# Patient Record
Sex: Female | Born: 1975 | Race: Black or African American | Hispanic: No | Marital: Married
Health system: Southern US, Academic
[De-identification: ages and names within clinical notes are randomized; demographics above are authoritative.]

## PROBLEM LIST (undated history)

## (undated) ENCOUNTER — Encounter: Attending: Gynecologic Oncology | Primary: Gynecologic Oncology

## (undated) ENCOUNTER — Ambulatory Visit: Payer: PRIVATE HEALTH INSURANCE

## (undated) ENCOUNTER — Encounter

## (undated) ENCOUNTER — Telehealth

## (undated) ENCOUNTER — Telehealth: Attending: Gynecologic Oncology | Primary: Gynecologic Oncology

## (undated) ENCOUNTER — Encounter: Attending: Diagnostic Radiology | Primary: Diagnostic Radiology

## (undated) ENCOUNTER — Ambulatory Visit

## (undated) ENCOUNTER — Encounter
Attending: Student in an Organized Health Care Education/Training Program | Primary: Student in an Organized Health Care Education/Training Program

## (undated) ENCOUNTER — Telehealth
Attending: Student in an Organized Health Care Education/Training Program | Primary: Student in an Organized Health Care Education/Training Program

## (undated) ENCOUNTER — Ambulatory Visit
Payer: PRIVATE HEALTH INSURANCE | Attending: Student in an Organized Health Care Education/Training Program | Primary: Student in an Organized Health Care Education/Training Program

## (undated) ENCOUNTER — Telehealth
Payer: PRIVATE HEALTH INSURANCE | Attending: Student in an Organized Health Care Education/Training Program | Primary: Student in an Organized Health Care Education/Training Program

## (undated) ENCOUNTER — Encounter: Attending: Family Medicine | Primary: Family Medicine

## (undated) ENCOUNTER — Telehealth: Attending: Hematology & Oncology | Primary: Hematology & Oncology

## (undated) DIAGNOSIS — J45909 Unspecified asthma, uncomplicated: Secondary | ICD-10-CM

## (undated) DIAGNOSIS — R110 Nausea: Secondary | ICD-10-CM

## (undated) DIAGNOSIS — D6481 Anemia due to antineoplastic chemotherapy: Secondary | ICD-10-CM

## (undated) DIAGNOSIS — R1900 Intra-abdominal and pelvic swelling, mass and lump, unspecified site: Secondary | ICD-10-CM

## (undated) DIAGNOSIS — Z95828 Presence of other vascular implants and grafts: Secondary | ICD-10-CM

## (undated) DIAGNOSIS — Z800 Family history of malignant neoplasm of digestive organs: Secondary | ICD-10-CM

## (undated) DIAGNOSIS — M1990 Unspecified osteoarthritis, unspecified site: Secondary | ICD-10-CM

## (undated) DIAGNOSIS — D649 Anemia, unspecified: Secondary | ICD-10-CM

## (undated) DIAGNOSIS — K259 Gastric ulcer, unspecified as acute or chronic, without hemorrhage or perforation: Secondary | ICD-10-CM

## (undated) DIAGNOSIS — Z8042 Family history of malignant neoplasm of prostate: Secondary | ICD-10-CM

## (undated) DIAGNOSIS — T451X5A Adverse effect of antineoplastic and immunosuppressive drugs, initial encounter: Secondary | ICD-10-CM

## (undated) DIAGNOSIS — E099 Drug or chemical induced diabetes mellitus without complications: Secondary | ICD-10-CM

## (undated) DIAGNOSIS — T380X5A Adverse effect of glucocorticoids and synthetic analogues, initial encounter: Secondary | ICD-10-CM

## (undated) DIAGNOSIS — Z8041 Family history of malignant neoplasm of ovary: Secondary | ICD-10-CM

## (undated) DIAGNOSIS — Z803 Family history of malignant neoplasm of breast: Secondary | ICD-10-CM

## (undated) HISTORY — DX: Family history of malignant neoplasm of ovary: Z80.41

## (undated) HISTORY — DX: Family history of malignant neoplasm of digestive organs: Z80.0

## (undated) HISTORY — DX: Family history of malignant neoplasm of prostate: Z80.42

## (undated) HISTORY — DX: Intra-abdominal and pelvic swelling, mass and lump, unspecified site: R19.00

## (undated) HISTORY — DX: Family history of malignant neoplasm of breast: Z80.3

## (undated) HISTORY — PX: UPPER GASTROINTESTINAL ENDOSCOPY: SHX188

---

## 2015-02-15 ENCOUNTER — Encounter (HOSPITAL_COMMUNITY): Payer: Self-pay | Admitting: Emergency Medicine

## 2015-02-15 ENCOUNTER — Emergency Department (HOSPITAL_COMMUNITY): Payer: Self-pay

## 2015-02-15 ENCOUNTER — Inpatient Hospital Stay (HOSPITAL_COMMUNITY)
Admission: EM | Admit: 2015-02-15 | Discharge: 2015-02-17 | DRG: 418 | Disposition: A | Discharge status: Home / Self Care | Payer: Self-pay | Attending: Surgery | Admitting: Surgery

## 2015-02-15 DIAGNOSIS — Z791 Long term (current) use of non-steroidal anti-inflammatories (NSAID): Secondary | ICD-10-CM

## 2015-02-15 DIAGNOSIS — K8000 Calculus of gallbladder with acute cholecystitis without obstruction: Principal | ICD-10-CM | POA: Diagnosis present

## 2015-02-15 DIAGNOSIS — R609 Edema, unspecified: Secondary | ICD-10-CM | POA: Diagnosis present

## 2015-02-15 DIAGNOSIS — Z6841 Body Mass Index (BMI) 40.0 and over, adult: Secondary | ICD-10-CM

## 2015-02-15 DIAGNOSIS — K819 Cholecystitis, unspecified: Secondary | ICD-10-CM | POA: Diagnosis present

## 2015-02-15 DIAGNOSIS — R1011 Right upper quadrant pain: Secondary | ICD-10-CM

## 2015-02-15 DIAGNOSIS — J45909 Unspecified asthma, uncomplicated: Secondary | ICD-10-CM | POA: Diagnosis present

## 2015-02-15 DIAGNOSIS — Z419 Encounter for procedure for purposes other than remedying health state, unspecified: Secondary | ICD-10-CM

## 2015-02-15 DIAGNOSIS — Z800 Family history of malignant neoplasm of digestive organs: Secondary | ICD-10-CM

## 2015-02-15 HISTORY — DX: Unspecified asthma, uncomplicated: J45.909

## 2015-02-15 LAB — CBC
HEMATOCRIT: 34.6 % — AB (ref 36.0–46.0)
Hemoglobin: 11.1 g/dL — ABNORMAL LOW (ref 12.0–15.0)
MCH: 27.1 pg (ref 26.0–34.0)
MCHC: 32.1 g/dL (ref 30.0–36.0)
MCV: 84.6 fL (ref 78.0–100.0)
PLATELETS: 317 10*3/uL (ref 150–400)
RBC: 4.09 MIL/uL (ref 3.87–5.11)
RDW: 12.6 % (ref 11.5–15.5)
WBC: 8.8 10*3/uL (ref 4.0–10.5)

## 2015-02-15 LAB — HCG, QUANTITATIVE, PREGNANCY: hCG, Beta Chain, Quant, S: <1 m[IU]/mL (ref <5)

## 2015-02-15 LAB — COMPREHENSIVE METABOLIC PANEL
ALT: 22 U/L (ref 14–54)
ANION GAP: 11 (ref 5–15)
AST: 17 U/L (ref 15–41)
Albumin: 3.7 g/dL (ref 3.5–5.0)
Alkaline Phosphatase: 89 U/L (ref 38–126)
BUN: 11 mg/dL (ref 6–20)
CALCIUM: 9 mg/dL (ref 8.9–10.3)
CO2: 23 mmol/L (ref 22–32)
Chloride: 106 mmol/L (ref 101–111)
Creatinine, Ser: 0.68 mg/dL (ref 0.44–1.00)
GFR calc non Af Amer: >60 mL/min (ref >60)
Glucose, Bld: 102 mg/dL — ABNORMAL HIGH (ref 65–99)
Potassium: 3.7 mmol/L (ref 3.5–5.1)
Sodium: 140 mmol/L (ref 135–145)
TOTAL PROTEIN: 7.5 g/dL (ref 6.5–8.1)
Total Bilirubin: 0.2 mg/dL — ABNORMAL LOW (ref 0.3–1.2)

## 2015-02-15 LAB — URINALYSIS, ROUTINE W REFLEX MICROSCOPIC
Bilirubin Urine: NEGATIVE
Glucose, UA: NEGATIVE mg/dL
Ketones, ur: NEGATIVE mg/dL
Nitrite: NEGATIVE
Protein, ur: NEGATIVE mg/dL
Specific Gravity, Urine: 1.028 (ref 1.005–1.030)
Urobilinogen, UA: 0.2 mg/dL (ref 0.0–1.0)
pH: 6 (ref 5.0–8.0)

## 2015-02-15 LAB — URINE MICROSCOPIC-ADD ON

## 2015-02-15 LAB — LIPASE, BLOOD: LIPASE: 17 U/L — AB (ref 22–51)

## 2015-02-15 MED ORDER — SODIUM CHLORIDE 0.9 % IV BOLUS (SEPSIS)
1000.0000 mL | Freq: Once | INTRAVENOUS | Status: AC
  Administered 2015-02-15: 1000 mL via INTRAVENOUS

## 2015-02-15 MED ORDER — ONDANSETRON HCL 4 MG/2ML IJ SOLN
4.0000 mg | Freq: Once | INTRAMUSCULAR | Status: AC
  Administered 2015-02-15: 4 mg via INTRAVENOUS
  Filled 2015-02-15: qty 2

## 2015-02-15 MED ORDER — MORPHINE SULFATE 4 MG/ML IJ SOLN
4.0000 mg | Freq: Once | INTRAMUSCULAR | Status: AC
  Administered 2015-02-15: 4 mg via INTRAVENOUS
  Filled 2015-02-15: qty 1

## 2015-02-15 NOTE — ED Provider Notes (Signed)
CSN: [id number]     Arrival date & time 02/15/15  1933 History   First MD Initiated Contact with Patient 02/15/15 2239     Chief Complaint  Patient presents with  . Back Pain   Sarah Newman is a 39 y.o. female with history of asthma who presents to the emergency department complaining of right upper quadrant abdominal pain which radiates to her right upper back ongoing since yesterday. The patient currently complaining of 7/10 throbbing pain. She reports associated nausea without vomiting. She reports a decreased appetite but ate a hot dog yesterday which made her nausea worse. She denies any burping, belching or acid reflux symptoms. Her last bowel movement was yesterday. She last ate at 6 pm. She reports taking Tylenol for treatment which she reports takes the edge off. She denies previous abdominal surgeries. The patient denies fevers, chills, vomiting, chest pain, shortness of breath, palpitations, acid reflux symptoms, urinary symptoms, diarrhea, constipation, cough, or rashes. Her last menstrual cycle started 02/12/2015.  (Consider location/radiation/quality/duration/timing/severity/associated sxs/prior Treatment) HPI  Past Medical History  Diagnosis Date  . Asthma    History reviewed. No pertinent past surgical history. No family history on file. History  Substance Use Topics  . Smoking status: Never Smoker   . Smokeless tobacco: Not on file  . Alcohol Use: Yes     Comment: Social drinker   OB History    No data available     Review of Systems  Constitutional: Negative for fever and chills.  HENT: Negative for congestion and sore throat.   Eyes: Negative for visual disturbance.  Respiratory: Negative for cough and shortness of breath.   Cardiovascular: Negative for chest pain and palpitations.  Gastrointestinal: Positive for nausea and abdominal pain. Negative for vomiting and diarrhea.  Genitourinary: Negative for dysuria, urgency, frequency, hematuria, flank pain and  difficulty urinating.  Musculoskeletal: Negative for back pain and neck pain.  Skin: Negative for rash.  Neurological: Negative for headaches.      Allergies  Review of patient's allergies indicates no known allergies.  Home Medications   Prior to Admission medications   Medication Sig Start Date End Date Taking? Authorizing Provider  Ibuprofen-Diphenhydramine Cit (IBUPROFEN PM) 200-38 MG TABS Take 2 tablets by mouth at bedtime as needed (sleep).   Yes Historical Provider, MD   BP 108/65 mmHg  Pulse 65  Temp(Src) 98.1 F (36.7 C) (Oral)  Resp 18  Ht 5\' 2"  (1.575 m)  Wt 215 lb (97.523 kg)  BMI 39.31 kg/m2  SpO2 100%  LMP 02/15/2015 Physical Exam  Constitutional: She appears well-developed and well-nourished. No distress.  Nontoxic appearing.  HENT:  Head: Normocephalic and atraumatic.  Mouth/Throat: Oropharynx is clear and moist.  Eyes: Conjunctivae are normal. Pupils are equal, round, and reactive to light. Right eye exhibits no discharge. Left eye exhibits no discharge.  Neck: Neck supple.  Cardiovascular: Normal rate, regular rhythm, normal heart sounds and intact distal pulses.  Exam reveals no gallop and no friction rub.   No murmur heard. Pulmonary/Chest: Effort normal and breath sounds normal. No respiratory distress. She has no wheezes. She has no rales.  Abdominal: Soft. Bowel sounds are normal. She exhibits no distension and no mass. There is tenderness. There is no rebound and no guarding.  Abdomen is soft. Bowel sounds are present. Patient has right upper quadrant tenderness to palpation with a positive Murphy sign. No epigastric tenderness. No right lower quadrant tenderness. Negative psoas and obturator sign.  Musculoskeletal: She exhibits no edema.  No midline neck or back tenderness. Patient has right upper mid back tenderness to palpation. No back edema, erythema, deformity or ecchymosis.  Lymphadenopathy:    She has no cervical adenopathy.  Neurological:  She is alert. Coordination normal.  Skin: Skin is warm and dry. No rash noted. She is not diaphoretic. No erythema. No pallor.  Psychiatric: She has a normal mood and affect. Her behavior is normal.  Nursing note and vitals reviewed.   ED Course  Procedures (including critical care time) Labs Review Labs Reviewed  LIPASE, BLOOD - Abnormal; Notable for the following:    Lipase 17 (*)    All other components within normal limits  COMPREHENSIVE METABOLIC PANEL - Abnormal; Notable for the following:    Glucose, Bld 102 (*)    Total Bilirubin 0.2 (*)    All other components within normal limits  CBC - Abnormal; Notable for the following:    Hemoglobin 11.1 (*)    HCT 34.6 (*)    All other components within normal limits  URINALYSIS, ROUTINE W REFLEX MICROSCOPIC (NOT AT [facility]) - Abnormal; Notable for the following:    APPearance CLOUDY (*)    Hgb urine dipstick SMALL (*)    Leukocytes, UA MODERATE (*)    All other components within normal limits  URINE MICROSCOPIC-ADD ON - Abnormal; Notable for the following:    Squamous Epithelial / LPF FEW (*)    Bacteria, UA MANY (*)    All other components within normal limits  URINE CULTURE  HCG, QUANTITATIVE, PREGNANCY    Imaging Review US Abdomen Limited Ruq  02/16/2015   CLINICAL DATA:  39 year old female with right upper quadrant pain  EXAM: US ABDOMEN LIMITED - RIGHT UPPER QUADRANT  COMPARISON:  None.  FINDINGS: Gallbladder:  There is a 1.8 cm stone at the neck of the gallbladder. There is apparent mild edema of the gallbladder wall measuring up to 7 mm in thickness. The No pericholecystic fluid. Evaluation for tenderness was limited as patient was pre-medicated. Small echogenic foci with ring down artifact in the body of the gallbladder may represent adenomyomatosis.  Common bile duct:  Diameter: 9 mm with tapering at the head of the pancreas to 6 mm.  Liver:  No focal lesion identified. Within normal limits in parenchymal echogenicity.   IMPRESSION: Cholelithiasis with apparent mild edema of the gallbladder wall. A hepatobiliary scintigraphy may provide better evaluation of the gallbladder if an acute cholecystitis is clinically suspected.   Electronically Signed   By: Anner Crete M.D.   On: 02/16/2015 00:26     EKG Interpretation None      Filed Vitals:   02/15/15 2022 02/15/15 2359  BP: 134/94 108/65  Pulse: 84 65  Temp: 98.1 F (36.7 C)   TempSrc: Oral   Resp: 18 18  Height: 5\' 2"  (1.575 m)   Weight: 215 lb (97.523 kg)   SpO2: 99% 100%     MDM   Meds given in ED:  Medications  sodium chloride 0.9 % bolus 1,000 mL (1,000 mLs Intravenous New Bag/Given 02/15/15 2322)  ondansetron (ZOFRAN) injection 4 mg (4 mg Intravenous Given 02/15/15 2319)  morphine 4 MG/ML injection 4 mg (4 mg Intravenous Given 02/15/15 2319)  HYDROmorphone (DILAUDID) injection 1 mg (1 mg Intravenous Given 02/16/15 0057)    New Prescriptions   No medications on file    Final diagnoses:  Right upper quadrant pain  Cholecystitis   This is a 39 y.o. female with history of asthma who  presents to the emergency department complaining of right upper quadrant abdominal pain which radiates to her right upper back ongoing since yesterday. The patient currently complaining of 7/10 throbbing pain. She reports associated nausea without vomiting. She reports a decreased appetite but ate a hot dog yesterday which made her nausea worse. On exam patient is afebrile and nontoxic appearing. Patient has right upper quadrant tenderness to palpation with positive Murphy sign. Urinalysis shows moderate leukocytes with many bacteria. Urine sent for culture. Lipase is 17. CMP indicates an AST of 17 and an ALT 22. Alkaline phosphatase is 89. Total bilirubin is 0.2. She is a negative hCG. Her white count is normal. Ultrasound shows a 1.8 cm stone at the neck of the gallbladder with edema of the gallbladder wall. There is positive Murphy sign. Dr. Zella Richer from  general surgery was consulted who advises admission to [place] and will see the patient in the morning. The patient is in agreement with admission.   This patient was discussed with and evaluated by Dr. Randal Buba who agrees with assessment and plan.    Waynetta Pean, PA-C 02/16/15 0206  April Palumbo, MD 02/16/15 [phone]

## 2015-02-15 NOTE — ED Notes (Signed)
Patient reports midback and upper abdominal pain which began yesterday.  Reports tenderness on palpation.  Denies vomiting/diarrhea, chest pain, shortness of breath.

## 2015-02-15 NOTE — ED Notes (Signed)
Patient complains of mid lowe back pain around her kidney area.  She has noticed blood in her stool on 7-13 and 7-14.  She also has constant epigastric pain. Patient states eating does not make it worse.  She has some nausea but no vomiting or diarrhea. No fall or injuries.

## 2015-02-16 ENCOUNTER — Inpatient Hospital Stay (HOSPITAL_COMMUNITY): Payer: Self-pay | Admitting: Anesthesiology

## 2015-02-16 ENCOUNTER — Inpatient Hospital Stay (HOSPITAL_COMMUNITY): Payer: Self-pay

## 2015-02-16 ENCOUNTER — Encounter (HOSPITAL_COMMUNITY): Admission: EM | Disposition: A | Discharge status: Home / Self Care | Payer: Self-pay | Source: Home / Self Care

## 2015-02-16 ENCOUNTER — Encounter (HOSPITAL_COMMUNITY): Payer: Self-pay | Admitting: General Practice

## 2015-02-16 DIAGNOSIS — K819 Cholecystitis, unspecified: Secondary | ICD-10-CM | POA: Diagnosis present

## 2015-02-16 HISTORY — PX: CHOLECYSTECTOMY: SHX55

## 2015-02-16 LAB — SURGICAL PCR SCREEN
MRSA, PCR: NEGATIVE
STAPHYLOCOCCUS AUREUS: NEGATIVE

## 2015-02-16 LAB — CREATININE, SERUM
CREATININE: 0.75 mg/dL (ref 0.44–1.00)
GFR calc Af Amer: >60 mL/min (ref >60)
GFR calc non Af Amer: >60 mL/min (ref >60)

## 2015-02-16 LAB — CBC
HEMATOCRIT: 32.6 % — AB (ref 36.0–46.0)
Hemoglobin: 10.5 g/dL — ABNORMAL LOW (ref 12.0–15.0)
MCH: 28.3 pg (ref 26.0–34.0)
MCHC: 32.2 g/dL (ref 30.0–36.0)
MCV: 87.9 fL (ref 78.0–100.0)
Platelets: 298 10*3/uL (ref 150–400)
RBC: 3.71 MIL/uL — ABNORMAL LOW (ref 3.87–5.11)
RDW: 12.7 % (ref 11.5–15.5)
WBC: 11.2 10*3/uL — AB (ref 4.0–10.5)

## 2015-02-16 SURGERY — LAPAROSCOPIC CHOLECYSTECTOMY WITH INTRAOPERATIVE CHOLANGIOGRAM
Anesthesia: General

## 2015-02-16 MED ORDER — HYDROMORPHONE HCL 1 MG/ML IJ SOLN
1.0000 mg | INTRAMUSCULAR | Status: DC | PRN
  Administered 2015-02-16: 1 mg via INTRAVENOUS
  Filled 2015-02-16: qty 1

## 2015-02-16 MED ORDER — CEFTRIAXONE SODIUM IN DEXTROSE 40 MG/ML IV SOLN
2.0000 g | INTRAVENOUS | Status: DC
  Administered 2015-02-16: 2 g via INTRAVENOUS
  Filled 2015-02-16 (×2): qty 50

## 2015-02-16 MED ORDER — LACTATED RINGERS IV SOLN
INTRAVENOUS | Status: DC
  Administered 2015-02-16: 1000 mL via INTRAVENOUS
  Administered 2015-02-16: 12:00:00 via INTRAVENOUS

## 2015-02-16 MED ORDER — ROCURONIUM BROMIDE 100 MG/10ML IV SOLN: INTRAVENOUS | Status: DC | PRN

## 2015-02-16 MED ORDER — ONDANSETRON HCL 4 MG/2ML IJ SOLN
INTRAMUSCULAR | Status: AC
  Filled 2015-02-16: qty 2

## 2015-02-16 MED ORDER — SODIUM CHLORIDE 0.9 % IV SOLN
INTRAVENOUS | Status: DC
  Administered 2015-02-16: 04:00:00 via INTRAVENOUS

## 2015-02-16 MED ORDER — GLYCOPYRROLATE 0.2 MG/ML IJ SOLN
INTRAMUSCULAR | Status: DC | PRN
  Administered 2015-02-16: 0.6 mg via INTRAVENOUS

## 2015-02-16 MED ORDER — BUPIVACAINE LIPOSOME 1.3 % IJ SUSP
20.0000 mL | Freq: Once | INTRAMUSCULAR | Status: DC
  Filled 2015-02-16: qty 20

## 2015-02-16 MED ORDER — CETYLPYRIDINIUM CHLORIDE 0.05 % MT LIQD: 7.0000 mL | Freq: Two times a day (BID) | OROMUCOSAL | Status: DC

## 2015-02-16 MED ORDER — LIDOCAINE HCL (CARDIAC) 20 MG/ML IV SOLN
INTRAVENOUS | Status: AC
  Filled 2015-02-16: qty 5

## 2015-02-16 MED ORDER — HEPARIN SODIUM (PORCINE) 5000 UNIT/ML IJ SOLN
5000.0000 [IU] | Freq: Three times a day (TID) | INTRAMUSCULAR | Status: DC
  Administered 2015-02-16 – 2015-02-17 (×2): 5000 [IU] via SUBCUTANEOUS
  Filled 2015-02-16 (×5): qty 1

## 2015-02-16 MED ORDER — ONDANSETRON HCL 4 MG/2ML IJ SOLN: 4.0000 mg | Freq: Three times a day (TID) | INTRAMUSCULAR | Status: AC | PRN

## 2015-02-16 MED ORDER — HYDROMORPHONE HCL 1 MG/ML IJ SOLN
INTRAMUSCULAR | Status: DC | PRN
  Administered 2015-02-16: 0.5 mg via INTRAVENOUS

## 2015-02-16 MED ORDER — SODIUM CHLORIDE 0.9 % IJ SOLN
INTRAMUSCULAR | Status: AC
  Filled 2015-02-16: qty 10

## 2015-02-16 MED ORDER — MEPERIDINE HCL 50 MG/ML IJ SOLN: 6.2500 mg | INTRAMUSCULAR | Status: DC | PRN

## 2015-02-16 MED ORDER — LIDOCAINE HCL (CARDIAC) 20 MG/ML IV SOLN
INTRAVENOUS | Status: DC | PRN
  Administered 2015-02-16: 60 mg via INTRAVENOUS

## 2015-02-16 MED ORDER — HYDROMORPHONE HCL 1 MG/ML IJ SOLN
1.0000 mg | Freq: Once | INTRAMUSCULAR | Status: AC
  Administered 2015-02-16: 1 mg via INTRAVENOUS
  Filled 2015-02-16: qty 1

## 2015-02-16 MED ORDER — EPHEDRINE SULFATE 50 MG/ML IJ SOLN
INTRAMUSCULAR | Status: AC
  Filled 2015-02-16: qty 1

## 2015-02-16 MED ORDER — ONDANSETRON HCL 4 MG PO TABS: 4.0000 mg | ORAL_TABLET | Freq: Four times a day (QID) | ORAL | Status: DC | PRN

## 2015-02-16 MED ORDER — BUPIVACAINE LIPOSOME 1.3 % IJ SUSP
INTRAMUSCULAR | Status: DC | PRN
  Administered 2015-02-16: 20 mL

## 2015-02-16 MED ORDER — ROCURONIUM BROMIDE 100 MG/10ML IV SOLN
INTRAVENOUS | Status: DC | PRN
  Administered 2015-02-16: 40 mg via INTRAVENOUS
  Administered 2015-02-16: 10 mg via INTRAVENOUS

## 2015-02-16 MED ORDER — HYDROMORPHONE HCL 1 MG/ML IJ SOLN
1.0000 mg | INTRAMUSCULAR | Status: AC | PRN
  Administered 2015-02-16 (×2): 1 mg via INTRAVENOUS
  Filled 2015-02-16 (×2): qty 1

## 2015-02-16 MED ORDER — SUCCINYLCHOLINE CHLORIDE 20 MG/ML IJ SOLN
INTRAMUSCULAR | Status: DC | PRN
  Administered 2015-02-16: 100 mg via INTRAVENOUS

## 2015-02-16 MED ORDER — KCL IN DEXTROSE-NACL 20-5-0.9 MEQ/L-%-% IV SOLN
INTRAVENOUS | Status: DC
  Administered 2015-02-16 – 2015-02-17 (×2): via INTRAVENOUS
  Filled 2015-02-16 (×4): qty 1000

## 2015-02-16 MED ORDER — ROCURONIUM BROMIDE 100 MG/10ML IV SOLN
INTRAVENOUS | Status: AC
  Filled 2015-02-16: qty 1

## 2015-02-16 MED ORDER — ONDANSETRON HCL 4 MG/2ML IJ SOLN
INTRAMUSCULAR | Status: DC | PRN
  Administered 2015-02-16: 4 mg via INTRAVENOUS

## 2015-02-16 MED ORDER — MIDAZOLAM HCL 2 MG/2ML IJ SOLN: 0.5000 mg | Freq: Once | INTRAMUSCULAR | Status: DC | PRN

## 2015-02-16 MED ORDER — IOHEXOL 300 MG/ML  SOLN
INTRAMUSCULAR | Status: DC | PRN
  Administered 2015-02-16: 50 mL via INTRAVENOUS

## 2015-02-16 MED ORDER — MIDAZOLAM HCL 5 MG/5ML IJ SOLN
INTRAMUSCULAR | Status: DC | PRN
  Administered 2015-02-16: 2 mg via INTRAVENOUS

## 2015-02-16 MED ORDER — HYDROMORPHONE HCL 2 MG/ML IJ SOLN
INTRAMUSCULAR | Status: AC
  Filled 2015-02-16: qty 1

## 2015-02-16 MED ORDER — ONDANSETRON HCL 4 MG/2ML IJ SOLN: 4.0000 mg | Freq: Four times a day (QID) | INTRAMUSCULAR | Status: DC | PRN

## 2015-02-16 MED ORDER — PROPOFOL 10 MG/ML IV BOLUS
INTRAVENOUS | Status: AC
  Filled 2015-02-16: qty 20

## 2015-02-16 MED ORDER — PROPOFOL 10 MG/ML IV BOLUS
INTRAVENOUS | Status: DC | PRN
  Administered 2015-02-16: 150 mg via INTRAVENOUS

## 2015-02-16 MED ORDER — MORPHINE SULFATE 2 MG/ML IJ SOLN
1.0000 mg | INTRAMUSCULAR | Status: DC | PRN
  Administered 2015-02-16 (×2): 1 mg via INTRAVENOUS
  Filled 2015-02-16 (×2): qty 1

## 2015-02-16 MED ORDER — DEXAMETHASONE SODIUM PHOSPHATE 10 MG/ML IJ SOLN
INTRAMUSCULAR | Status: AC
  Filled 2015-02-16: qty 1

## 2015-02-16 MED ORDER — LACTATED RINGERS IR SOLN
Status: DC | PRN
  Administered 2015-02-16: 3000 mL

## 2015-02-16 MED ORDER — PROMETHAZINE HCL 25 MG/ML IJ SOLN: 6.2500 mg | INTRAMUSCULAR | Status: DC | PRN

## 2015-02-16 MED ORDER — HYDROMORPHONE HCL 1 MG/ML IJ SOLN: 0.2500 mg | INTRAMUSCULAR | Status: DC | PRN

## 2015-02-16 MED ORDER — MIDAZOLAM HCL 2 MG/2ML IJ SOLN
INTRAMUSCULAR | Status: AC
  Filled 2015-02-16: qty 2

## 2015-02-16 MED ORDER — DEXAMETHASONE SODIUM PHOSPHATE 10 MG/ML IJ SOLN
INTRAMUSCULAR | Status: DC | PRN
  Administered 2015-02-16: 10 mg via INTRAVENOUS

## 2015-02-16 MED ORDER — NEOSTIGMINE METHYLSULFATE 10 MG/10ML IV SOLN
INTRAVENOUS | Status: DC | PRN
  Administered 2015-02-16: 4 mg via INTRAVENOUS

## 2015-02-16 MED ORDER — FENTANYL CITRATE (PF) 100 MCG/2ML IJ SOLN
INTRAMUSCULAR | Status: DC | PRN
  Administered 2015-02-16: 150 ug via INTRAVENOUS
  Administered 2015-02-16: 100 ug via INTRAVENOUS

## 2015-02-16 MED ORDER — FENTANYL CITRATE (PF) 250 MCG/5ML IJ SOLN
INTRAMUSCULAR | Status: AC
  Filled 2015-02-16: qty 5

## 2015-02-16 MED ORDER — HYDROCODONE-ACETAMINOPHEN 5-325 MG PO TABS
1.0000 | ORAL_TABLET | ORAL | Status: DC | PRN
  Administered 2015-02-16: 1 via ORAL
  Filled 2015-02-16: qty 1

## 2015-02-16 MED ORDER — ALBUTEROL SULFATE (2.5 MG/3ML) 0.083% IN NEBU
3.0000 mL | INHALATION_SOLUTION | Freq: Four times a day (QID) | RESPIRATORY_TRACT | Status: DC | PRN
  Administered 2015-02-16 – 2015-02-17 (×2): 3 mL via RESPIRATORY_TRACT
  Filled 2015-02-16 (×2): qty 3

## 2015-02-16 MED ORDER — KCL IN DEXTROSE-NACL 20-5-0.45 MEQ/L-%-% IV SOLN: INTRAVENOUS | Status: DC

## 2015-02-16 SURGICAL SUPPLY — 32 items
APPLIER CLIP ROT 10 11.4 M/L (STAPLE) ×2
BENZOIN TINCTURE PRP APPL 2/3 (GAUZE/BANDAGES/DRESSINGS) IMPLANT
CABLE HIGH FREQUENCY MONO STRZ (ELECTRODE) ×2 IMPLANT
CATH REDDICK CHOLANGI 4FR 50CM (CATHETERS) ×2 IMPLANT
CLIP APPLIE ROT 10 11.4 M/L (STAPLE) ×1 IMPLANT
COVER MAYO STAND STRL (DRAPES) ×2 IMPLANT
COVER SURGICAL LIGHT HANDLE (MISCELLANEOUS) ×2 IMPLANT
DECANTER SPIKE VIAL GLASS SM (MISCELLANEOUS) ×2 IMPLANT
DRAPE C-ARM 42X120 X-RAY (DRAPES) ×2 IMPLANT
DRAPE LAPAROSCOPIC ABDOMINAL (DRAPES) ×2 IMPLANT
ELECT REM PT RETURN 9FT ADLT (ELECTROSURGICAL) ×2
ELECTRODE REM PT RTRN 9FT ADLT (ELECTROSURGICAL) ×1 IMPLANT
GLOVE BIOGEL M 8.0 STRL (GLOVE) ×2 IMPLANT
GOWN STRL REUS W/TWL XL LVL3 (GOWN DISPOSABLE) ×8 IMPLANT
HEMOSTAT SURGICEL 4X8 (HEMOSTASIS) IMPLANT
IV CATH AUTO 14GX1.75 SAFE ORG (IV SOLUTION) ×2 IMPLANT
KIT BASIN OR (CUSTOM PROCEDURE TRAY) ×2 IMPLANT
LIQUID BAND (GAUZE/BANDAGES/DRESSINGS) ×2 IMPLANT
POUCH RETRIEVAL ECOSAC 10 (ENDOMECHANICALS) ×1 IMPLANT
POUCH RETRIEVAL ECOSAC 10MM (ENDOMECHANICALS) ×1
SCISSORS LAP 5X45 EPIX DISP (ENDOMECHANICALS) ×2 IMPLANT
SCRUB PCMX 4 OZ (MISCELLANEOUS) ×2 IMPLANT
SET IRRIG TUBING LAPAROSCOPIC (IRRIGATION / IRRIGATOR) ×2 IMPLANT
SLEEVE XCEL OPT CAN 5 100 (ENDOMECHANICALS) ×4 IMPLANT
STRIP CLOSURE SKIN 1/2X4 (GAUZE/BANDAGES/DRESSINGS) IMPLANT
SUT VIC AB 4-0 SH 18 (SUTURE) ×2 IMPLANT
SYR 20CC LL (SYRINGE) ×2 IMPLANT
TOWEL OR 17X26 10 PK STRL BLUE (TOWEL DISPOSABLE) ×2 IMPLANT
TRAY LAPAROSCOPIC (CUSTOM PROCEDURE TRAY) ×2 IMPLANT
TROCAR BLADELESS OPT 5 100 (ENDOMECHANICALS) ×2 IMPLANT
TROCAR XCEL BLUNT TIP 100MML (ENDOMECHANICALS) IMPLANT
TROCAR XCEL NON-BLD 11X100MML (ENDOMECHANICALS) ×2 IMPLANT

## 2015-02-16 NOTE — Anesthesia Postprocedure Evaluation (Signed)
  Anesthesia Post-op Note  Patient: Sarah Newman  Procedure(s) Performed: Procedure(s): LAPAROSCOPIC CHOLECYSTECTOMY WITH INTRAOPERATIVE CHOLANGIOGRAM (N/A)  Patient Location: PACU  Anesthesia Type:General  Level of Consciousness: awake, alert , oriented and patient cooperative  Airway and Oxygen Therapy: Patient Spontanous Breathing  Post-op Pain: mild  Post-op Assessment: Post-op Vital signs reviewed, Patient's Cardiovascular Status Stable, Respiratory Function Stable, Patent Airway, No signs of Nausea or vomiting and Pain level controlled              Post-op Vital Signs: Reviewed and stable  Last Vitals:  Filed Vitals:   02/16/15 1324  BP:   Pulse: 85  Temp: 36.3 C  Resp: 17    Complications: No apparent anesthesia complications

## 2015-02-16 NOTE — Transfer of Care (Signed)
Immediate Anesthesia Transfer of Care Note  Patient: Sarah Newman  Procedure(s) Performed: Procedure(s): LAPAROSCOPIC CHOLECYSTECTOMY WITH INTRAOPERATIVE CHOLANGIOGRAM (N/A)  Patient Location: PACU  Anesthesia Type:General  Level of Consciousness: awake, alert  and oriented  Airway & Oxygen Therapy: Patient Spontanous Breathing and Patient connected to face mask oxygen  Post-op Assessment: Report given to RN and Post -op Vital signs reviewed and stable  Post vital signs: Reviewed and stable  Last Vitals:  Filed Vitals:   02/16/15 0607  BP: 122/78  Pulse: 82  Temp: 36.9 C  Resp: 18    Complications: No apparent anesthesia complications

## 2015-02-16 NOTE — H&P (Signed)
Sarah Newman is an 38 y.o. female.   Chief Complaint:   Right upper quadrant pain radiating to back. HPI:  This is a 38-year-old female who for the past few months has had intermittent episodes of right upper quadrant pain that radiated through to her back. These have been self-limited. She does not relate specific food intake to these. Once a night, she had a severe episode that persisted. She presented to the emergency department where she is noted to have right upper quadrant tenderness and guarding. She underwent an ultrasound which demonstrated a large gallstone impacted in the neck of the gallbladder with gallbladder wall edema. She was given pain medication but when I wore off, the pain came back. She's had some nausea but no vomiting. No fever or chills.  Past Medical History  Diagnosis Date  . Asthma     History reviewed. No pertinent past surgical history.  History reviewed. No pertinent family history. Social History:  reports that she has never smoked. She does not have any smokeless tobacco history on file. She reports that she drinks alcohol. She reports that she does not use illicit drugs.  Allergies: No Known Allergies  Medications Prior to Admission  Medication Sig Dispense Refill  . Ibuprofen-Diphenhydramine Cit (IBUPROFEN PM) 200-38 MG TABS Take 2 tablets by mouth at bedtime as needed (sleep).      Results for orders placed or performed during the hospital encounter of 02/15/15 (from the past 48 hour(s))  Lipase, blood     Status: Abnormal   Collection Time: 02/15/15  9:35 PM  Result Value Ref Range   Lipase 17 (L) 22 - 51 U/L  Comprehensive metabolic panel     Status: Abnormal   Collection Time: 02/15/15  9:35 PM  Result Value Ref Range   Sodium 140 135 - 145 mmol/L   Potassium 3.7 3.5 - 5.1 mmol/L   Chloride 106 101 - 111 mmol/L   CO2 23 22 - 32 mmol/L   Glucose, Bld 102 (H) 65 - 99 mg/dL   BUN 11 6 - 20 mg/dL   Creatinine, Ser 0.68 0.44 - 1.00 mg/dL    Calcium 9.0 8.9 - 10.3 mg/dL   Total Protein 7.5 6.5 - 8.1 g/dL   Albumin 3.7 3.5 - 5.0 g/dL   AST 17 15 - 41 U/L   ALT 22 14 - 54 U/L   Alkaline Phosphatase 89 38 - 126 U/L   Total Bilirubin 0.2 (L) 0.3 - 1.2 mg/dL   GFR calc non Af Amer >60 >60 mL/min   GFR calc Af Amer >60 >60 mL/min    Comment: (NOTE) The eGFR has been calculated using the CKD EPI equation. This calculation has not been validated in all clinical situations. eGFR's persistently <60 mL/min signify possible Chronic Kidney Disease.    Anion gap 11 5 - 15  CBC     Status: Abnormal   Collection Time: 02/15/15  9:35 PM  Result Value Ref Range   WBC 8.8 4.0 - 10.5 K/uL   RBC 4.09 3.87 - 5.11 MIL/uL   Hemoglobin 11.1 (L) 12.0 - 15.0 g/dL   HCT 34.6 (L) 36.0 - 46.0 %   MCV 84.6 78.0 - 100.0 fL   MCH 27.1 26.0 - 34.0 pg   MCHC 32.1 30.0 - 36.0 g/dL   RDW 12.6 11.5 - 15.5 %   Platelets 317 150 - 400 K/uL  hCG, quantitative, pregnancy     Status: None   Collection Time: 02/15/15  9:35   PM  Result Value Ref Range   hCG, Beta Chain, Quant, S <1 <5 mIU/mL    Comment:          GEST. AGE      CONC.  (mIU/mL)   <=1 WEEK        5 - 50     2 WEEKS       50 - 500     3 WEEKS       100 - 10,000     4 WEEKS     1,000 - 30,000     5 WEEKS     3,500 - 115,000   6-8 WEEKS     12,000 - 270,000    12 WEEKS     15,000 - 220,000        FEMALE AND NON-PREGNANT FEMALE:     LESS THAN 5 mIU/mL   Urinalysis, Routine w reflex microscopic (not at [facility])     Status: Abnormal   Collection Time: 02/15/15  9:40 PM  Result Value Ref Range   Color, Urine YELLOW YELLOW   APPearance CLOUDY (A) CLEAR   Specific Gravity, Urine 1.028 1.005 - 1.030   pH 6.0 5.0 - 8.0   Glucose, UA NEGATIVE NEGATIVE mg/dL   Hgb urine dipstick SMALL (A) NEGATIVE   Bilirubin Urine NEGATIVE NEGATIVE   Ketones, ur NEGATIVE NEGATIVE mg/dL   Protein, ur NEGATIVE NEGATIVE mg/dL   Urobilinogen, UA 0.2 0.0 - 1.0 mg/dL   Nitrite NEGATIVE NEGATIVE   Leukocytes, UA  MODERATE (A) NEGATIVE  Urine microscopic-add on     Status: Abnormal   Collection Time: 02/15/15  9:40 PM  Result Value Ref Range   Squamous Epithelial / LPF FEW (A) RARE   WBC, UA 21-50 <3 WBC/hpf   RBC / HPF 0-2 <3 RBC/hpf   Bacteria, UA MANY (A) RARE  Surgical pcr screen     Status: None   Collection Time: 02/16/15  3:33 AM  Result Value Ref Range   MRSA, PCR NEGATIVE NEGATIVE   Staphylococcus aureus NEGATIVE NEGATIVE    Comment:        The Xpert SA Assay (FDA approved for NASAL specimens in patients over 16 years of age), is one component of a comprehensive surveillance program.  Test performance has been validated by [facility] for patients greater than or equal to 39 year old. It is not intended to diagnose infection nor to guide or monitor treatment.    US Abdomen Limited Ruq  02/16/2015   CLINICAL DATA:  39 year old female with right upper quadrant pain  EXAM: US ABDOMEN LIMITED - RIGHT UPPER QUADRANT  COMPARISON:  None.  FINDINGS: Gallbladder:  There is a 1.8 cm stone at the neck of the gallbladder. There is apparent mild edema of the gallbladder wall measuring up to 7 mm in thickness. The No pericholecystic fluid. Evaluation for tenderness was limited as patient was pre-medicated. Small echogenic foci with ring down artifact in the body of the gallbladder may represent adenomyomatosis.  Common bile duct:  Diameter: 9 mm with tapering at the head of the pancreas to 6 mm.  Liver:  No focal lesion identified. Within normal limits in parenchymal echogenicity.  IMPRESSION: Cholelithiasis with apparent mild edema of the gallbladder wall. A hepatobiliary scintigraphy may provide better evaluation of the gallbladder if an acute cholecystitis is clinically suspected.   Electronically Signed   By: Anner Crete M.D.   On: 02/16/2015 00:26    Review of Systems  Constitutional:  Negative for fever and chills.  HENT: Negative for congestion and sore throat.   Respiratory: Negative  for shortness of breath.   Cardiovascular: Negative for chest pain.  Gastrointestinal: Positive for nausea, abdominal pain and blood in stool (Occasional bright red blood in stool). Negative for vomiting.  Genitourinary: Negative for dysuria and hematuria.  Endo/Heme/Allergies: Does not bruise/bleed easily.    Blood pressure 122/78, pulse 82, temperature 98.5 F (36.9 C), temperature source Oral, resp. rate 18, height 5' 2" (1.575 m), weight 101.1 kg (222 lb 14.2 oz), last menstrual period 02/15/2015, SpO2 98 %. Physical Exam  Constitutional: No distress.  Obese female  HENT:  Head: Normocephalic and atraumatic.  Eyes: EOM are normal. No scleral icterus.  Cardiovascular: Normal rate and regular rhythm.   Respiratory: Effort normal and breath sounds normal.  GI: Soft. There is tenderness (Right upper quadrant). There is guarding.  Obese.  Musculoskeletal: She exhibits no edema.  Neurological: She is alert.  Skin: Skin is warm and dry. Erythema: Right upper quadrant.  Psychiatric: She has a normal mood and affect. Her behavior is normal.     Assessment/Plan 1. Persistent biliary colic due to cholelithiasis with concern for early acute cholecystitis-Liver function tests are normal, common bile duct diameter upper limits of normal, white blood cell count normal.  2. Occasional bright red blood per rectum. Father had colon cancer at the age of 26. She also had an aunt with colon cancer.  Plan: Start IV antibiotics. Laparoscopic cholecystectomy possible open with cholangiogram.  I have explained the procedure, risks, and aftercare of cholecystectomy.  Risks include but are not limited to bleeding, infection, wound problems, anesthesia, diarrhea, bile leak, injury to common bile duct/liver/intestine.  He/she seems to understand and agrees with the plan.  She will need further investigation of intermittent bright red blood per rectum as an outpatient. We discussed this.   Dimetri Armitage  J 02/16/2015, 7:19 AM

## 2015-02-16 NOTE — Anesthesia Preprocedure Evaluation (Addendum)
Anesthesia Evaluation  Patient identified by MRN, date of birth, ID band Patient awake    Reviewed: Allergy & Precautions, NPO status , Patient's Chart, lab work & pertinent test results  History of Anesthesia Complications Negative for: history of anesthetic complications  Airway Mallampati: II  TM Distance: >3 FB Neck ROM: Full    Dental  (+) Chipped, Dental Advisory Given   Pulmonary asthma (last inhaler use 3 weeks ago) ,  breath sounds clear to auscultation        Cardiovascular negative cardio ROS  Rhythm:Regular Rate:Normal     Neuro/Psych negative neurological ROS     GI/Hepatic Neg liver ROS, GERD-  Poorly Controlled,Nausea with acute chole   Endo/Other  Morbid obesity  Renal/GU negative Renal ROS     Musculoskeletal   Abdominal (+) + obese,   Peds  Hematology negative hematology ROS (+)   Anesthesia Other Findings   Reproductive/Obstetrics LMP presently                             Anesthesia Physical Anesthesia Plan  ASA: III  Anesthesia Plan: General   Post-op Pain Management:    Induction: Intravenous, Rapid sequence and Cricoid pressure planned  Airway Management Planned: Oral ETT  Additional Equipment:   Intra-op Plan:   Post-operative Plan: Extubation in OR  Informed Consent: I have reviewed the patients History and Physical, chart, labs and discussed the procedure including the risks, benefits and alternatives for the proposed anesthesia with the patient or authorized representative who has indicated his/her understanding and acceptance.   Dental advisory given  Plan Discussed with: CRNA and Surgeon  Anesthesia Plan Comments: (Plan routine monitors, GETA)        Anesthesia Quick Evaluation

## 2015-02-16 NOTE — Interval H&P Note (Signed)
History and Physical Interval Note:  02/16/2015 10:02 AM  Sarah Newman  has presented today for surgery, with the diagnosis of cholecystitis  The various methods of treatment have been discussed with the patient and family. After consideration of risks, benefits and other options for treatment, the patient has consented to  Procedure(s): LAPAROSCOPIC CHOLECYSTECTOMY WITH INTRAOPERATIVE CHOLANGIOGRAM (N/A) as a surgical intervention .  The patient's history has been reviewed, patient examined, no change in status, stable for surgery.  I have reviewed the patient's chart and labs.  Questions were answered to the patient's satisfaction.     Rodnisha Blomgren B

## 2015-02-16 NOTE — Anesthesia Procedure Notes (Signed)
Procedure Name: Intubation Date/Time: 02/16/2015 10:33 AM Performed by: Glory Buff Pre-anesthesia Checklist: Patient identified, Emergency Drugs available, Suction available and Patient being monitored Patient Re-evaluated:Patient Re-evaluated prior to inductionOxygen Delivery Method: Circle System Utilized Preoxygenation: Pre-oxygenation with 100% oxygen Intubation Type: IV induction Ventilation: Mask ventilation without difficulty Laryngoscope Size: Miller and 3 Tube type: Oral Tube size: 7.5 mm Number of attempts: 1 Airway Equipment and Method: Stylet and Oral airway Placement Confirmation: ETT inserted through vocal cords under direct vision,  positive ETCO2 and breath sounds checked- equal and bilateral Secured at: 20 cm Tube secured with: Tape Dental Injury: Teeth and Oropharynx as per pre-operative assessment

## 2015-02-16 NOTE — ED Notes (Signed)
MD at bedside. 

## 2015-02-16 NOTE — Op Note (Signed)
Almarie Kurdziel   02/16/2015  12:13 PM  Procedure: Laparoscopic Cholecystectomy with intraoperative cholangiogram  Surgeon: Catalina Antigua B. Hassell Done, MD, FACS Asst:  none  Anes:  General  Drains:  None  Findings: Acute cholecystitis with normal IOC (air bubbles noted)  Description of Procedure: The patient was taken to OR 2 and given general anesthesia.  The patient was prepped with PCMX and draped sterilely. A time out was performed.  Access to the abdomen was achieved with a 5 mm Optiview through the right upper quadrant.  Port placement included three 5 mm and one 11 mm trocar.    The gallbladder was visualized and the fundus was grasped and the gallbladder was elevated. Traction on the infundibulum allowed for successful demonstration of the critical view. Inflammatory changes were acute with edema and adhesions to the duodenum which were lysed.  The cystic duct was identified and clipped up on the gallbladder and an incision was made in the cystic duct and the Reddick catheter was inserted after milking the cystic duct of any debris. A dynamic cholangiogram was performed which demonstrated some air bubbles but no stones and intrahepatic radicles and flow into the duodenum.    The cystic duct was then triple clipped and divided, the cystic artery was double clipped and divided and then the gallbladder was removed from the gallbladder bed. Removal of the gallbladder from the gallbladder bed was performed with the hook cautery .  The gallbladder was then placed in a bag and brought out through the 11 mm  trocar sites. The gallbladder bed was inspected and no bleeding or bile leaks were seen.    Incisions were injected with Exparel and closed with 4-0 Vicryl and Liquiban on the skin.  Sponge and needle count were correct.    The patient was taken to the recovery room in satisfactory condition.

## 2015-02-17 LAB — COMPREHENSIVE METABOLIC PANEL
ALT: 32 U/L (ref 14–54)
AST: 32 U/L (ref 15–41)
Albumin: 3.4 g/dL — ABNORMAL LOW (ref 3.5–5.0)
Alkaline Phosphatase: 78 U/L (ref 38–126)
Anion gap: 7 (ref 5–15)
BUN: 5 mg/dL — ABNORMAL LOW (ref 6–20)
CO2: 26 mmol/L (ref 22–32)
Calcium: 8.5 mg/dL — ABNORMAL LOW (ref 8.9–10.3)
Chloride: 106 mmol/L (ref 101–111)
Creatinine, Ser: 0.6 mg/dL (ref 0.44–1.00)
GFR calc Af Amer: >60 mL/min (ref >60)
GLUCOSE: 159 mg/dL — AB (ref 65–99)
Potassium: 4 mmol/L (ref 3.5–5.1)
SODIUM: 139 mmol/L (ref 135–145)
Total Bilirubin: 0.1 mg/dL — ABNORMAL LOW (ref 0.3–1.2)
Total Protein: 6.7 g/dL (ref 6.5–8.1)

## 2015-02-17 LAB — CBC
HCT: 30.6 % — ABNORMAL LOW (ref 36.0–46.0)
Hemoglobin: 9.9 g/dL — ABNORMAL LOW (ref 12.0–15.0)
MCH: 28 pg (ref 26.0–34.0)
MCHC: 32.4 g/dL (ref 30.0–36.0)
MCV: 86.7 fL (ref 78.0–100.0)
Platelets: 315 10*3/uL (ref 150–400)
RBC: 3.53 MIL/uL — ABNORMAL LOW (ref 3.87–5.11)
RDW: 12.5 % (ref 11.5–15.5)
WBC: 9.6 10*3/uL (ref 4.0–10.5)

## 2015-02-17 MED ORDER — OXYCODONE-ACETAMINOPHEN 5-325 MG PO TABS: 1.0000 | ORAL_TABLET | ORAL | Status: DC | PRN

## 2015-02-17 NOTE — Discharge Summary (Signed)
Reviewed d/c instructions with pt and family including follow-up appointment, medications, incision care, and precautions.  Emphasized importance of consuming low fat diet as related to gall bladder removal.  Pt/family verbalized good understanding of all topics discussed. Pt being d/c to home into care of family.

## 2015-02-17 NOTE — Progress Notes (Signed)
1 Day Post-Op  Subjective: Feels better. No complaints  Objective: Vital signs in last 24 hours: Temp:  [97.4 F (36.3 C)-98.9 F (37.2 C)] 98 F (36.7 C) (07/16 0530) Pulse Rate:  [69-85] 85 (07/16 0530) Resp:  [10-18] 18 (07/16 0530) BP: (107-126)/(66-81) 109/70 mmHg (07/16 0530) SpO2:  [95 %-100 %] 98 % (07/16 0530) Last BM Date: 02/14/15  Intake/Output from previous day: 07/15 0701 - 07/16 0700 In: 1400 [I.V.:1400] Out: 1075 [Urine:1050; Blood:25] Intake/Output this shift:    Resp: clear to auscultation bilaterally Cardio: regular rate and rhythm GI: soft, mild tenderness. incisions look good. good bs  Lab Results:   Recent Labs  02/16/15 1510 02/17/15 0515  WBC 11.2* 9.6  HGB 10.5* 9.9*  HCT 32.6* 30.6*  PLT 298 315   BMET  Recent Labs  02/15/15 2135 02/16/15 1510 02/17/15 0515  NA 140  --  139  K 3.7  --  4.0  CL 106  --  106  CO2 23  --  26  GLUCOSE 102*  --  159*  BUN 11  --  5*  CREATININE 0.68 0.75 0.60  CALCIUM 9.0  --  8.5*   PT/INR No results for input(s): LABPROT, INR in the last 72 hours. ABG No results for input(s): PHART, HCO3 in the last 72 hours.  Invalid input(s): PCO2, PO2  Studies/Results: Dg Cholangiogram Operative  02/16/2015   CLINICAL DATA:  Cholelithiasis, laparoscopic cholecystectomy  EXAM: INTRAOPERATIVE CHOLANGIOGRAM  TECHNIQUE: Cholangiographic images from the C-arm fluoroscopic device were submitted for interpretation post-operatively. Please see the procedural report for the amount of contrast and the fluoroscopy time utilized.  COMPARISON:  02/16/2015  FINDINGS: Intraoperative cholangiogram performed during the laparoscopic cholecystectomy. Single spot fluoroscopic image obtained during the injection. The cystic duct, biliary confluence, common hepatic duct, and common bile duct appear patent. Contrast does drain into the duodenum. No obstruction. Round filling defect in the proximal common bile duct could represent  injected air bubbles versus choledocholithiasis.  IMPRESSION: Negative for biliary obstruction  Common bile duct filling defects could represent air bubbles versus choledocholithiasis. Limited single view only exam.   Electronically Signed   By: Jerilynn Mages.  Shick M.D.   On: 02/16/2015 12:10   US Abdomen Limited Ruq  02/16/2015   CLINICAL DATA:  39 year old female with right upper quadrant pain  EXAM: US ABDOMEN LIMITED - RIGHT UPPER QUADRANT  COMPARISON:  None.  FINDINGS: Gallbladder:  There is a 1.8 cm stone at the neck of the gallbladder. There is apparent mild edema of the gallbladder wall measuring up to 7 mm in thickness. The No pericholecystic fluid. Evaluation for tenderness was limited as patient was pre-medicated. Small echogenic foci with ring down artifact in the body of the gallbladder may represent adenomyomatosis.  Common bile duct:  Diameter: 9 mm with tapering at the head of the pancreas to 6 mm.  Liver:  No focal lesion identified. Within normal limits in parenchymal echogenicity.  IMPRESSION: Cholelithiasis with apparent mild edema of the gallbladder wall. A hepatobiliary scintigraphy may provide better evaluation of the gallbladder if an acute cholecystitis is clinically suspected.   Electronically Signed   By: Anner Crete M.D.   On: 02/16/2015 00:26    Anti-infectives: Anti-infectives    Start     Dose/Rate Route Frequency Ordered Stop   02/16/15 0800  cefTRIAXone (ROCEPHIN) 2 g in dextrose 5 % 50 mL IVPB - Premix  Status:  Discontinued     2 g 100 mL/hr over 30 Minutes  Intravenous Every 24 hours 02/16/15 0727 02/17/15 0730      Assessment/Plan: s/p Procedure(s): LAPAROSCOPIC CHOLECYSTECTOMY WITH INTRAOPERATIVE CHOLANGIOGRAM (N/A) Advance diet Discharge  LOS: 1 day    TOTH III,Dale Strausser S 02/17/2015

## 2015-02-17 NOTE — Progress Notes (Signed)
UR Completed. Keaundre Thelin, RN, BSN.  [phone] 

## 2015-02-17 NOTE — Plan of Care (Signed)
Problem: Phase II Progression Outcomes Goal: Return of bowel function (flatus, BM) IF ABDOMINAL SURGERY:  Outcome: Completed/Met Date Met:  02/17/15 Adequate for d/c.     

## 2015-02-18 LAB — URINE CULTURE: Culture: >=100000

## 2015-02-19 ENCOUNTER — Encounter (HOSPITAL_COMMUNITY): Payer: Self-pay | Admitting: Surgery

## 2015-02-28 NOTE — Discharge Summary (Signed)
Physician Discharge Summary  Patient ID: Sarah Newman MRN: [id number] DOB/AGE: April 29, 1976 39 y.o.  Admit date: 02/15/2015 Discharge date: 02/17/2015  Admission Diagnoses:  Acute cholecystitis  Discharge Diagnoses:  Active Problems:   Cholecystitis   PROCEDURES: Laparoscopic Cholecystectomy with intraoperative cholangiogram, 02/16/15, Dr. Johnathan Hausen.  Hospital Course: This is a 39 year old female who for the past few months has had intermittent episodes of right upper quadrant pain that radiated through to her back. These have been self-limited. She does not relate specific food intake to these. Once a night, she had a severe episode that persisted. She presented to the emergency department where she is noted to have right upper quadrant tenderness and guarding. She underwent an ultrasound which demonstrated a large gallstone impacted in the neck of the gallbladder with gallbladder wall edema. She was given pain medication but when I wore off, the pain came back. She's had some nausea but no vomiting. No fever or chills She was admitted by Dr. Zella Richer on 02/16/15, and underwent surgery by DR Hassell Done later that day.  She did well and was discharge by Dr. Marlou Starks the following AM.  I did not see the patient.  Follow up as below.    Disposition: 01-Home or Self Care  Discharge Instructions    Call MD for:  difficulty breathing, headache or visual disturbances    Complete by:  As directed      Call MD for:  extreme fatigue    Complete by:  As directed      Call MD for:  hives    Complete by:  As directed      Call MD for:  persistant dizziness or light-headedness    Complete by:  As directed      Call MD for:  persistant nausea and vomiting    Complete by:  As directed      Call MD for:  redness, tenderness, or signs of infection (pain, swelling, redness, odor or green/yellow discharge around incision site)    Complete by:  As directed      Call MD for:  severe uncontrolled pain     Complete by:  As directed      Call MD for:  temperature >100.4    Complete by:  As directed      Diet - low sodium heart healthy    Complete by:  As directed      Discharge instructions    Complete by:  As directed   May shower. No heavy lifting. Low fat diet     Increase activity slowly    Complete by:  As directed      No wound care    Complete by:  As directed             Medication List    TAKE these medications        IBUPROFEN PM 200-38 MG Tabs  Generic drug:  Ibuprofen-Diphenhydramine Cit  Take 2 tablets by mouth at bedtime as needed (sleep).     oxyCODONE-acetaminophen 5-325 MG per tablet  Commonly known as:  ROXICET  Take 1-2 tablets by mouth every 4 (four) hours as needed for severe pain.           Follow-up Information    Follow up with Johnathan Hausen B, MD In 2 weeks.   Specialty:  General Surgery   Contact information:   [address] [place] [id number] [phone]       Signed: Earnstine Regal 02/28/2015, 2:56 PM

## 2016-07-04 DIAGNOSIS — Z8619 Personal history of other infectious and parasitic diseases: Secondary | ICD-10-CM

## 2016-07-04 HISTORY — DX: Personal history of other infectious and parasitic diseases: Z86.19

## 2016-07-14 ENCOUNTER — Emergency Department (HOSPITAL_COMMUNITY): Payer: BLUE CROSS/BLUE SHIELD

## 2016-07-14 ENCOUNTER — Emergency Department (HOSPITAL_COMMUNITY)
Admission: EM | Admit: 2016-07-14 | Discharge: 2016-07-14 | Disposition: A | Discharge status: Home / Self Care | Payer: BLUE CROSS/BLUE SHIELD | Attending: Emergency Medicine | Admitting: Emergency Medicine

## 2016-07-14 ENCOUNTER — Encounter (HOSPITAL_COMMUNITY): Payer: Self-pay | Admitting: Emergency Medicine

## 2016-07-14 DIAGNOSIS — R1012 Left upper quadrant pain: Secondary | ICD-10-CM | POA: Diagnosis present

## 2016-07-14 DIAGNOSIS — N3000 Acute cystitis without hematuria: Secondary | ICD-10-CM | POA: Insufficient documentation

## 2016-07-14 DIAGNOSIS — J45909 Unspecified asthma, uncomplicated: Secondary | ICD-10-CM | POA: Insufficient documentation

## 2016-07-14 DIAGNOSIS — Z79899 Other long term (current) drug therapy: Secondary | ICD-10-CM | POA: Diagnosis not present

## 2016-07-14 LAB — URINALYSIS, ROUTINE W REFLEX MICROSCOPIC
Bilirubin Urine: NEGATIVE
GLUCOSE, UA: NEGATIVE mg/dL
Hgb urine dipstick: NEGATIVE
Ketones, ur: NEGATIVE mg/dL
NITRITE: NEGATIVE
PH: 6 (ref 5.0–8.0)
Protein, ur: NEGATIVE mg/dL
Specific Gravity, Urine: 1.021 (ref 1.005–1.030)

## 2016-07-14 LAB — COMPREHENSIVE METABOLIC PANEL
ALT: 18 U/L (ref 14–54)
AST: 17 U/L (ref 15–41)
Albumin: 4 g/dL (ref 3.5–5.0)
Alkaline Phosphatase: 82 U/L (ref 38–126)
Anion gap: 5 (ref 5–15)
BUN: 11 mg/dL (ref 6–20)
CO2: 28 mmol/L (ref 22–32)
Calcium: 8.8 mg/dL — ABNORMAL LOW (ref 8.9–10.3)
Chloride: 104 mmol/L (ref 101–111)
Creatinine, Ser: 0.55 mg/dL (ref 0.44–1.00)
GFR calc Af Amer: >60 mL/min (ref >60)
GFR calc non Af Amer: >60 mL/min (ref >60)
Glucose, Bld: 102 mg/dL — ABNORMAL HIGH (ref 65–99)
Potassium: 3.1 mmol/L — ABNORMAL LOW (ref 3.5–5.1)
Sodium: 137 mmol/L (ref 135–145)
Total Bilirubin: 0.4 mg/dL (ref 0.3–1.2)
Total Protein: 7.2 g/dL (ref 6.5–8.1)

## 2016-07-14 LAB — CBC WITH DIFFERENTIAL/PLATELET
Basophils Absolute: 0 10*3/uL (ref 0.0–0.1)
Basophils Relative: 0 %
Eosinophils Absolute: 0.3 10*3/uL (ref 0.0–0.7)
Eosinophils Relative: 4 %
HCT: 33.7 % — ABNORMAL LOW (ref 36.0–46.0)
Hemoglobin: 11.1 g/dL — ABNORMAL LOW (ref 12.0–15.0)
Lymphocytes Relative: 29 %
Lymphs Abs: 2.1 10*3/uL (ref 0.7–4.0)
MCH: 28 pg (ref 26.0–34.0)
MCHC: 32.9 g/dL (ref 30.0–36.0)
MCV: 85.1 fL (ref 78.0–100.0)
Monocytes Absolute: 0.3 10*3/uL (ref 0.1–1.0)
Monocytes Relative: 5 %
Neutro Abs: 4.5 10*3/uL (ref 1.7–7.7)
Neutrophils Relative %: 62 %
Platelets: 351 10*3/uL (ref 150–400)
RBC: 3.96 MIL/uL (ref 3.87–5.11)
RDW: 12.8 % (ref 11.5–15.5)
WBC: 7.2 10*3/uL (ref 4.0–10.5)

## 2016-07-14 LAB — POC OCCULT BLOOD, ED: Fecal Occult Bld: POSITIVE — AB

## 2016-07-14 LAB — LIPASE, BLOOD: Lipase: 25 U/L (ref 11–51)

## 2016-07-14 LAB — POC URINE PREG, ED: Preg Test, Ur: NEGATIVE

## 2016-07-14 MED ORDER — CEPHALEXIN 500 MG PO CAPS: 500.0000 mg | ORAL_CAPSULE | Freq: Two times a day (BID) | ORAL | 0 refills | Status: DC

## 2016-07-14 MED ORDER — IOPAMIDOL (ISOVUE-300) INJECTION 61%
INTRAVENOUS | Status: AC
  Filled 2016-07-14: qty 100

## 2016-07-14 MED ORDER — SODIUM CHLORIDE 0.9 % IV BOLUS (SEPSIS)
1000.0000 mL | Freq: Once | INTRAVENOUS | Status: AC
  Administered 2016-07-14: 1000 mL via INTRAVENOUS

## 2016-07-14 MED ORDER — KETOROLAC TROMETHAMINE 30 MG/ML IJ SOLN
30.0000 mg | Freq: Once | INTRAMUSCULAR | Status: AC
  Administered 2016-07-14: 30 mg via INTRAVENOUS
  Filled 2016-07-14: qty 1

## 2016-07-14 MED ORDER — PANTOPRAZOLE SODIUM 20 MG PO TBEC: 20.0000 mg | DELAYED_RELEASE_TABLET | Freq: Every day | ORAL | 0 refills | Status: DC

## 2016-07-14 MED ORDER — ONDANSETRON HCL 4 MG PO TABS: 4.0000 mg | ORAL_TABLET | Freq: Four times a day (QID) | ORAL | 0 refills | Status: DC

## 2016-07-14 MED ORDER — ONDANSETRON HCL 4 MG/2ML IJ SOLN
4.0000 mg | Freq: Once | INTRAMUSCULAR | Status: AC
  Administered 2016-07-14: 4 mg via INTRAVENOUS
  Filled 2016-07-14: qty 2

## 2016-07-14 MED ORDER — POTASSIUM CHLORIDE CRYS ER 20 MEQ PO TBCR
60.0000 meq | EXTENDED_RELEASE_TABLET | Freq: Once | ORAL | Status: AC
  Administered 2016-07-14: 60 meq via ORAL
  Filled 2016-07-14: qty 3

## 2016-07-14 MED ORDER — SUCRALFATE 1 GM/10ML PO SUSP: 1.0000 g | Freq: Three times a day (TID) | ORAL | 0 refills | Status: DC

## 2016-07-14 MED ORDER — MORPHINE SULFATE (PF) 4 MG/ML IV SOLN
4.0000 mg | Freq: Once | INTRAVENOUS | Status: AC
  Administered 2016-07-14: 4 mg via INTRAVENOUS
  Filled 2016-07-14: qty 1

## 2016-07-14 MED ORDER — CEPHALEXIN 500 MG PO CAPS
500.0000 mg | ORAL_CAPSULE | Freq: Once | ORAL | Status: AC
  Administered 2016-07-14: 500 mg via ORAL
  Filled 2016-07-14: qty 1

## 2016-07-14 MED ORDER — IOPAMIDOL (ISOVUE-300) INJECTION 61%
100.0000 mL | Freq: Once | INTRAVENOUS | Status: AC | PRN
  Administered 2016-07-14: 100 mL via INTRAVENOUS

## 2016-07-14 MED ORDER — SODIUM CHLORIDE 0.9 % IJ SOLN
INTRAMUSCULAR | Status: AC
  Filled 2016-07-14: qty 50

## 2016-07-14 NOTE — Discharge Instructions (Signed)
Medications: Protonix, Zofran, Carafate, Keflex  Treatment: Begin taking Protonix once daily as prescribed. Take Zofran every 6 hours as needed for nausea and vomiting. Take Carafate 4 times daily, especially before meals. Take Keflex twice daily as prescribed for your urinary tract infection. A culture of your urine has been sent and you will be called in 2-3 days if there needs to be a change in your antibiotic.  Follow-up: Please follow-up with [facility] gastroenterology as soon as possible for further evaluation and treatment of your symptoms. Please return to the emergency department if you develop any new or worsening symptoms.

## 2016-07-14 NOTE — ED Triage Notes (Signed)
Pt reports having left sided flank pain for the last month worsening tonight. Pt reports having nausea and vomiting.

## 2016-07-14 NOTE — ED Provider Notes (Signed)
[facility] DEPT Provider Note   CSN: [id number] Arrival date & time: 07/14/16  0113  By signing my name below, I, Sarah Newman, attest that this documentation has been prepared under the direction and in the presence of Eliezer Mccoy, PA-C. Electronically Signed: Gwenlyn Newman, ED Scribe. 07/14/16. 1:56 AM.  History   Chief Complaint Chief Complaint  Patient presents with  . Flank Pain   The history is provided by the patient. No language interpreter was used.   HPI Comments: Peris Sarah Newman is a 40 y.o. female with PMHx of Asthma who presents to the Emergency Department complaining of gradual onset, constant, moderate, LUQ and left flank pain onset 1 month, but worsening tonight, waking her up out of sleep. No exacerbating or relieving factors. Pain is dull achy, throbbing pain. Pt states she had her gallbladder removed 1 year ago and the pain is similar. She denies heavy lifting or injury. She reports associated bloody stool onset today and nausea/vomiting over the past month intermittently. Pt states there was blood was throughout her stool once today and that her stool looks "mucousy". Denies urinary symptoms, fever, urgency, hematuria, dysuria, abnormal vaginal discharge. Pt's LMP was 4 days ago.  Pt denies hx of kidney stones.  Past Medical History:  Diagnosis Date  . Asthma     Patient Active Problem List   Diagnosis Date Noted  . Cholecystitis 02/16/2015    Past Surgical History:  Procedure Laterality Date  . CHOLECYSTECTOMY N/A 02/16/2015   Procedure: LAPAROSCOPIC CHOLECYSTECTOMY WITH INTRAOPERATIVE CHOLANGIOGRAM;  Surgeon: Johnathan Hausen, MD;  Location: [facility] ORS;  Service: General;  Laterality: N/A;    OB History    No data available     Home Medications    Prior to Admission medications   Medication Sig Start Date End Date Taking? Authorizing Provider  ibuprofen (ADVIL,MOTRIN) 200 MG tablet Take 800 mg by mouth every 6 (six) hours as needed for moderate pain.    Yes Historical Provider, MD  cephALEXin (KEFLEX) 500 MG capsule Take 1 capsule (500 mg total) by mouth 2 (two) times daily. 07/14/16   Feliciano Wynter M Tad Fancher, PA-C  ondansetron (ZOFRAN) 4 MG tablet Take 1 tablet (4 mg total) by mouth every 6 (six) hours. 07/14/16   Frederica Kuster, PA-C  oxyCODONE-acetaminophen (ROXICET) 5-325 MG per tablet Take 1-2 tablets by mouth every 4 (four) hours as needed for severe pain. Patient not taking: Reported on 07/14/2016 02/17/15   Autumn Messing III, MD  pantoprazole (PROTONIX) 20 MG tablet Take 1 tablet (20 mg total) by mouth daily. 07/14/16   Frederica Kuster, PA-C  sucralfate (CARAFATE) 1 GM/10ML suspension Take 10 mLs (1 g total) by mouth 4 (four) times daily -  with meals and at bedtime. 07/14/16   Frederica Kuster, PA-C    Family History History reviewed. No pertinent family history.  Social History Social History  Substance Use Topics  . Smoking status: Never Smoker  . Smokeless tobacco: Never Used  . Alcohol use Yes     Comment: Social drinker   Allergies   Patient has no known allergies.  Review of Systems Review of Systems  Constitutional: Positive for appetite change. Negative for chills and fever.  HENT: Negative for facial swelling and sore throat.   Respiratory: Negative for shortness of breath.   Cardiovascular: Negative for chest pain.  Gastrointestinal: Positive for abdominal pain, blood in stool, nausea and vomiting.  Genitourinary: Positive for flank pain. Negative for dysuria, hematuria, urgency and vaginal discharge.  Musculoskeletal:  Negative for back pain.  Skin: Negative for rash and wound.  Neurological: Negative for headaches.  Psychiatric/Behavioral: The patient is not nervous/anxious.    Physical Exam Updated Vital Signs BP 111/83 (BP Location: Left Arm)   Pulse 71   Temp 97.6 F (36.4 C) (Oral)   Resp 18   Ht 5\' 2"  (1.575 m)   Wt 99.8 kg   LMP 07/10/2016 Comment: negative urine pregnancy test 07/14/16  SpO2 99%   BMI  40.24 kg/m   Physical Exam  Constitutional: She appears well-developed and well-nourished. No distress.  HENT:  Head: Normocephalic and atraumatic.  Mouth/Throat: Oropharynx is clear and moist. No oropharyngeal exudate.  Eyes: Conjunctivae are normal. Pupils are equal, round, and reactive to light. Right eye exhibits no discharge. Left eye exhibits no discharge. No scleral icterus.  Neck: Normal range of motion. Neck supple. No thyromegaly present.  Cardiovascular: Normal rate, regular rhythm, normal heart sounds and intact distal pulses.  Exam reveals no gallop and no friction rub.   No murmur heard. Pulmonary/Chest: Effort normal and breath sounds normal. No stridor. No respiratory distress. She has no wheezes. She has no rales.  Abdominal: Soft. Bowel sounds are normal. She exhibits no distension. There is tenderness in the periumbilical area, suprapubic area and left upper quadrant. There is CVA tenderness (left). There is no rebound and no guarding.  Musculoskeletal: She exhibits no edema.  Lymphadenopathy:    She has no cervical adenopathy.  Neurological: She is alert. Coordination normal.  Skin: Skin is warm and dry. No rash noted. She is not diaphoretic. No pallor.  Psychiatric: She has a normal mood and affect.  Nursing note and vitals reviewed.  ED Treatments / Results  DIAGNOSTIC STUDIES: Oxygen Saturation is 99% on RA, normal by my interpretation.    COORDINATION OF CARE: 1:50 AM Discussed treatment plan with pt at bedside which includes CT scan and blood work and pt agreed to plan.  Labs (all labs ordered are listed, but only abnormal results are displayed) Labs Reviewed  URINALYSIS, ROUTINE W REFLEX MICROSCOPIC - Abnormal; Notable for the following:       Result Value   APPearance HAZY (*)    Leukocytes, UA MODERATE (*)    Bacteria, UA MANY (*)    Squamous Epithelial / LPF 6-30 (*)    All other components within normal limits  COMPREHENSIVE METABOLIC PANEL -  Abnormal; Notable for the following:    Potassium 3.1 (*)    Glucose, Bld 102 (*)    Calcium 8.8 (*)    All other components within normal limits  CBC WITH DIFFERENTIAL/PLATELET - Abnormal; Notable for the following:    Hemoglobin 11.1 (*)    HCT 33.7 (*)    All other components within normal limits  POC OCCULT BLOOD, ED - Abnormal; Notable for the following:    Fecal Occult Bld POSITIVE (*)    All other components within normal limits  LIPASE, BLOOD  OCCULT BLOOD X 1 CARD TO LAB, STOOL  POC URINE PREG, ED    EKG  EKG Interpretation None      Radiology Ct Abdomen Pelvis W Contrast  Result Date: 07/14/2016 CLINICAL DATA:  40 year old female with left upper quadrant/ left flank abdominal pain. Nausea and vomiting. Blood in stool. EXAM: CT ABDOMEN AND PELVIS WITH CONTRAST TECHNIQUE: Multidetector CT imaging of the abdomen and pelvis was performed using the standard protocol following bolus administration of intravenous contrast. CONTRAST:  159mL ISOVUE-300 IOPAMIDOL (ISOVUE-300) INJECTION 61% COMPARISON:  Abdominal ultrasound dated 02/16/2015 FINDINGS: Lower chest: The visualized lung bases are clear. No intra-abdominal free air or free fluid. Hepatobiliary: Apparent diffuse fatty infiltration of the liver. No intrahepatic biliary ductal dilatation. Cholecystectomy. The common bile duct appears unremarkable and measures up to 5 mm in diameter. Pancreas: Unremarkable. No pancreatic ductal dilatation or surrounding inflammatory changes. Spleen: Normal in size without focal abnormality. Adrenals/Urinary Tract: Adrenal glands are unremarkable. Kidneys are normal, without renal calculi, focal lesion, or hydronephrosis. Bladder is unremarkable. Stomach/Bowel: There is abdominal focal area of thickening and inflammatory changes in the greater curvature of the proximal stomach (series 2 image 24 and coronal series 4, image 57). There is associated focal outpouching of the gastric wall. Findings  likely represent an inflamed gastric ulcer, versus focal gastritis or less likely gastric diverticulitis. No extraluminal air identified to suggest perforation. Correlation with clinical exam and follow-up to resolution or additional evaluation with endoscopy is recommended. There is no evidence of bowel obstruction. Normal appendix. Vascular/Lymphatic: No significant vascular findings are present. No enlarged abdominal or pelvic lymph nodes. Reproductive: The uterus is anteverted. An ill-defined 3.0 x 2.7 cm high attenuating area in the right uterine body (series 2, image 65) may be artifactual or represent a fibroid. Ultrasound may provide better evaluation. The ovaries are grossly unremarkable. Other:  None Musculoskeletal: No acute or significant osseous findings. IMPRESSION: Focal inflammatory changes and thickening of the proximal stomach along the greater curvature likely representing an inflamed a gastric ulcer, focal gastritis, or less likely cysts gastric diverticulitis. Correlation with clinical exam and follow-up with endoscopy recommended. No bowel obstruction. Normal appendix. Electronically Signed   By: Anner Crete M.D.   On: 07/14/2016 05:08    Procedures Procedures (including critical care time)  Medications Ordered in ED Medications  iopamidol (ISOVUE-300) 61 % injection (not administered)  sodium chloride 0.9 % injection (not administered)  cephALEXin (KEFLEX) capsule 500 mg (not administered)  potassium chloride SA (K-DUR,KLOR-CON) CR tablet 60 mEq (not administered)  sodium chloride 0.9 % bolus 1,000 mL (0 mLs Intravenous Stopped 07/14/16 0400)  morphine 4 MG/ML injection 4 mg (4 mg Intravenous Given 07/14/16 0246)  ondansetron (ZOFRAN) injection 4 mg (4 mg Intravenous Given 07/14/16 0240)  ketorolac (TORADOL) 30 MG/ML injection 30 mg (30 mg Intravenous Given 07/14/16 0400)  iopamidol (ISOVUE-300) 61 % injection 100 mL (100 mLs Intravenous Contrast Given 07/14/16 0409)     Initial Impression / Assessment and Plan / ED Course  I have reviewed the triage vital signs and the nursing notes.  Pertinent labs & imaging results that were available during my care of the patient were reviewed by me and considered in my medical decision making (see chart for details).  Clinical Course    Patient with UTI and probable gastric ulcer. CBC shows hemoglobin 11.1. CMP shows potassium 3.1, calcium 8.8. Potassium replaced in the ED. Lipase 25. UA shows moderate leukocytes, too numerous to count WBCs, many bacteria, 6-30 squamous epithelial cells. Most likely explaining suprapubic tenderness, despite urine symptoms. Urine pregnancy negative. Fecal occult positive. CT abdomen pelvis shows focal inflammatory changes and thickening of the proximal stomach along the greater curvature likely representing an inflamed gastric ulcer, focal gastritis, or less likely(gastric diverticulitis; correlation with clinical exam and follow-up with endoscopy recommended; no bowel obstruction and normal appendix. Patient discharged home with Protonix, Carafate, Zofran, Keflex for UTI. First dose given in the ED. Pain and nausea controlled with morphine, Toradol, Zofran in the ED. Patient advised to follow-up with gastroenterology this  week. Patient also advised to establish care with a primary care provider. Return precautions discussed. Patient understands and agrees with plan. Patient vitals stable throughout ED course and discharged in satisfactory condition. Patient discussed with Dr. Dina Rich who cut of the patient's management and agrees with plan.  Final Clinical Impressions(s) / ED Diagnoses   Final diagnoses:  Acute cystitis without hematuria   I personally performed the services described in this documentation, which was scribed in my presence. The recorded information has been reviewed and is accurate.   New Prescriptions New Prescriptions   CEPHALEXIN (KEFLEX) 500 MG CAPSULE    Take 1  capsule (500 mg total) by mouth 2 (two) times daily.   ONDANSETRON (ZOFRAN) 4 MG TABLET    Take 1 tablet (4 mg total) by mouth every 6 (six) hours.   PANTOPRAZOLE (PROTONIX) 20 MG TABLET    Take 1 tablet (20 mg total) by mouth daily.   SUCRALFATE (CARAFATE) 1 GM/10ML SUSPENSION    Take 10 mLs (1 g total) by mouth 4 (four) times daily -  with meals and at bedtime.     [address], PA-C 07/14/16 [id number]    Merryl Hacker, MD 07/15/16 [phone]

## 2016-07-14 NOTE — ED Notes (Signed)
Patient transported to CT 

## 2016-08-01 HISTORY — PX: UPPER GASTROINTESTINAL ENDOSCOPY: SHX188

## 2016-11-18 ENCOUNTER — Encounter (HOSPITAL_COMMUNITY): Payer: Self-pay | Admitting: Emergency Medicine

## 2016-11-18 ENCOUNTER — Emergency Department (HOSPITAL_COMMUNITY)
Admission: EM | Admit: 2016-11-18 | Discharge: 2016-11-18 | Disposition: A | Discharge status: Home / Self Care | Payer: BLUE CROSS/BLUE SHIELD | Attending: Emergency Medicine | Admitting: Emergency Medicine

## 2016-11-18 ENCOUNTER — Emergency Department (HOSPITAL_COMMUNITY): Payer: BLUE CROSS/BLUE SHIELD

## 2016-11-18 DIAGNOSIS — B3741 Candidal cystitis and urethritis: Secondary | ICD-10-CM | POA: Diagnosis not present

## 2016-11-18 DIAGNOSIS — R059 Cough, unspecified: Secondary | ICD-10-CM

## 2016-11-18 DIAGNOSIS — J189 Pneumonia, unspecified organism: Secondary | ICD-10-CM | POA: Insufficient documentation

## 2016-11-18 DIAGNOSIS — J45909 Unspecified asthma, uncomplicated: Secondary | ICD-10-CM | POA: Diagnosis not present

## 2016-11-18 DIAGNOSIS — N3000 Acute cystitis without hematuria: Secondary | ICD-10-CM

## 2016-11-18 DIAGNOSIS — Z79899 Other long term (current) drug therapy: Secondary | ICD-10-CM | POA: Diagnosis not present

## 2016-11-18 DIAGNOSIS — R05 Cough: Secondary | ICD-10-CM | POA: Diagnosis present

## 2016-11-18 LAB — CBC
HEMATOCRIT: 33.8 % — AB (ref 36.0–46.0)
Hemoglobin: 11.2 g/dL — ABNORMAL LOW (ref 12.0–15.0)
MCH: 28 pg (ref 26.0–34.0)
MCHC: 33.1 g/dL (ref 30.0–36.0)
MCV: 84.5 fL (ref 78.0–100.0)
PLATELETS: 272 10*3/uL (ref 150–400)
RBC: 4 MIL/uL (ref 3.87–5.11)
RDW: 13.1 % (ref 11.5–15.5)
WBC: 9.8 10*3/uL (ref 4.0–10.5)

## 2016-11-18 LAB — URINALYSIS, ROUTINE W REFLEX MICROSCOPIC
BILIRUBIN URINE: NEGATIVE
Glucose, UA: NEGATIVE mg/dL
KETONES UR: NEGATIVE mg/dL
NITRITE: POSITIVE — AB
Protein, ur: NEGATIVE mg/dL
SPECIFIC GRAVITY, URINE: 1.012 (ref 1.005–1.030)
pH: 6 (ref 5.0–8.0)

## 2016-11-18 LAB — BASIC METABOLIC PANEL
ANION GAP: 7 (ref 5–15)
BUN: 10 mg/dL (ref 6–20)
CO2: 26 mmol/L (ref 22–32)
CREATININE: 0.73 mg/dL (ref 0.44–1.00)
Calcium: 9.3 mg/dL (ref 8.9–10.3)
Chloride: 106 mmol/L (ref 101–111)
GLUCOSE: 108 mg/dL — AB (ref 65–99)
Potassium: 3.2 mmol/L — ABNORMAL LOW (ref 3.5–5.1)
Sodium: 139 mmol/L (ref 135–145)

## 2016-11-18 MED ORDER — ACETAMINOPHEN 325 MG PO TABS
650.0000 mg | ORAL_TABLET | Freq: Once | ORAL | Status: AC | PRN
  Administered 2016-11-18: 650 mg via ORAL
  Filled 2016-11-18: qty 2

## 2016-11-18 MED ORDER — LEVOFLOXACIN 500 MG PO TABS: 500.0000 mg | ORAL_TABLET | Freq: Every day | ORAL | 0 refills | Status: DC

## 2016-11-18 MED ORDER — FLUCONAZOLE 150 MG PO TABS: 150.0000 mg | ORAL_TABLET | Freq: Once | ORAL | 0 refills | Status: AC

## 2016-11-18 NOTE — Discharge Instructions (Signed)
Contact a health care provider if: °You have a fever. °You are losing sleep because you cannot control your cough with cough medicine. °Get help right away if: °You have worsening shortness of breath. °You have increased chest pain. °Your sickness becomes worse, especially if you are an older adult or have a weakened immune system. °You cough up blood. °

## 2016-11-18 NOTE — ED Provider Notes (Signed)
[facility] DEPT Provider Note   CSN: [id number] Arrival date & time: 11/18/16  1328     History   Chief Complaint Chief Complaint  Patient presents with  . Fever  . Asthma    HPI Cathryn Gallery is a 41 y.o. female who presents emergency Department with chief complaint of fatigue, malaise, headache, cough and fever. 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 She states that today she was feeling extremely poorly and was unable to get through her shift. The patient states that she had the flu back in January and has had a persistent cough, however, worsened over the past day or 2. She has been using her inhaler more frequently. Today she was running a fever, had associated myalgias, headache, body aches. She has also noticed urinary frequency.  HPI  Past Medical History:  Diagnosis Date  . Asthma     Patient Active Problem List   Diagnosis Date Noted  . Cholecystitis 02/16/2015    Past Surgical History:  Procedure Laterality Date  . CHOLECYSTECTOMY N/A 02/16/2015   Procedure: LAPAROSCOPIC CHOLECYSTECTOMY WITH INTRAOPERATIVE CHOLANGIOGRAM;  Surgeon: Johnathan Hausen, MD;  Location: [facility];  Service: General;  Laterality: N/A;    OB History    No data available       Home Medications    Prior to Admission medications   Medication Sig Start Date End Date Taking? Authorizing Provider  albuterol (PROVENTIL HFA;VENTOLIN HFA) 108 (90 Base) MCG/ACT inhaler Inhale 2 puffs into the lungs every 4 (four) hours as needed for wheezing or shortness of breath.   Yes Historical Provider, MD  cephALEXin (KEFLEX) 500 MG capsule Take 1 capsule (500 mg total) by mouth 2 (two) times daily. Patient not taking: Reported on 11/18/2016 07/14/16   Bea Graff Law, PA-C  ondansetron (ZOFRAN) 4 MG tablet Take 1 tablet (4 mg total) by mouth every 6 (six) hours. Patient not taking: Reported on 11/18/2016 07/14/16   Bea Graff Law, PA-C  pantoprazole (PROTONIX) 20 MG  tablet Take 1 tablet (20 mg total) by mouth daily. Patient not taking: Reported on 11/18/2016 07/14/16   Bea Graff Law, PA-C  sucralfate (CARAFATE) 1 GM/10ML suspension Take 10 mLs (1 g total) by mouth 4 (four) times daily -  with meals and at bedtime. Patient not taking: Reported on 11/18/2016 07/14/16   Frederica Kuster, PA-C    Family History History reviewed. No pertinent family history.  Social History Social History  Substance Use Topics  . Smoking status: Never Smoker  . Smokeless tobacco: Never Used  . Alcohol use Yes     Comment: Social drinker     Allergies   Patient has no known allergies.   Review of Systems Review of Systems  Ten systems reviewed and are negative for acute change, except as noted in the HPI.  Physical Exam Updated Vital Signs BP (!) 118/95   Pulse (!) 102   Temp 99.8 F (37.7 C) (Oral)   Resp 16   LMP 11/01/2016 (Exact Date)   SpO2 99%   Physical Exam  Constitutional: She is oriented to person, place, and time. She appears well-developed and well-nourished. No distress.  HENT:  Head: Normocephalic and atraumatic.  Eyes: Conjunctivae are normal. No scleral icterus.  Neck: Normal range of motion.  Cardiovascular: Normal rate, regular rhythm and normal heart sounds.  Exam reveals no gallop and no friction rub.   No murmur heard. Pulmonary/Chest: Effort normal and breath sounds normal. No respiratory distress.  Abdominal: Soft. Bowel sounds are normal. She exhibits no distension and no mass. There is no tenderness. There is no guarding.  Neurological: She is alert and oriented to person, place, and time.  Skin: Skin is warm and dry. She is not diaphoretic.  Psychiatric: Her behavior is normal.  Nursing note and vitals reviewed.    ED Treatments / Results  Labs (all labs ordered are listed, but only abnormal results are displayed) Labs Reviewed  CBC - Abnormal; Notable for the following:       Result Value   Hemoglobin 11.2 (*)     HCT 33.8 (*)    All other components within normal limits  BASIC METABOLIC PANEL - Abnormal; Notable for the following:    Potassium 3.2 (*)    Glucose, Bld 108 (*)    All other components within normal limits  URINALYSIS, ROUTINE W REFLEX MICROSCOPIC - Abnormal; Notable for the following:    APPearance CLOUDY (*)    Hgb urine dipstick MODERATE (*)    Nitrite POSITIVE (*)    Leukocytes, UA LARGE (*)    Bacteria, UA RARE (*)    Squamous Epithelial / LPF 0-5 (*)    All other components within normal limits  URINE CULTURE    EKG  EKG Interpretation None       Radiology Dg Chest 2 View  Result Date: 11/18/2016 CLINICAL DATA:  Fever and headache which began this morning with subsequent development of cough and shortness of breath EXAM: CHEST  2 VIEW COMPARISON:  None in PACs FINDINGS: The right hemidiaphragm is higher than the left. The lungs are borderline hypoinflated. There is patchy increased density in the right lower lobe. The left lung is grossly clear. The heart and pulmonary vascularity are normal. There is no pleural effusion. IMPRESSION: Atelectasis or pneumonia in the right lower lobe. Followup PA and lateral chest X-ray is recommended in 3-4 weeks following trial of antibiotic therapy to ensure resolution and exclude underlying malignancy. Electronically Signed   By: David  Martinique M.D.   On: 11/18/2016 16:51    Procedures Procedures (including critical care time)  Medications Ordered in ED Medications  acetaminophen (TYLENOL) tablet 650 mg (650 mg Oral Given 11/18/16 1355)     Initial Impression / Assessment and Plan / ED Course  I have reviewed the triage vital signs and the nursing notes.  Pertinent labs & imaging results that were available during my care of the patient were reviewed by me and considered in my medical decision making (see chart for details).     Patient with community-acquired pneumonia versus UTI. I have ordered Levaquin for coverage of both  organ systems. The patient also presented budding yeast in her urine and she will be given a dose of Diflucan to take now and one at the completion of her Levaquin. The patient is stable throughout her visit. I have sent her urine for culture. She appears safe for discharge at this time Final Clinical Impressions(s) / ED Diagnoses   Final diagnoses:  Cough  Acute cystitis without hematuria  Community acquired pneumonia, unspecified laterality  Yeast cystitis    New Prescriptions New Prescriptions   No medications on file     Margarita Mail, PA-C 11/19/16 1237    Isla Pence, MD 11/20/16 2309

## 2016-11-18 NOTE — ED Triage Notes (Signed)
Pt c/o fever and headache that began this morning, Pt denies cough but states mild SOB / asthma, has used inhaler, no distress in triage. Pt given po Tylenol in triage.

## 2016-11-21 LAB — URINE CULTURE

## 2016-11-22 ENCOUNTER — Telehealth: Payer: Self-pay

## 2016-11-22 NOTE — Telephone Encounter (Signed)
Post ED Visit - Positive Culture Follow-up  Culture report reviewed by antimicrobial stewardship pharmacist:  []  Elenor Quinones, Pharm.D. []  Heide Guile, Pharm.D., BCPS AQ-ID []  Parks Neptune, Pharm.D., BCPS []  Alycia Rossetti, Pharm.D., BCPS []  Wann, Pharm.D., BCPS, AAHIVP []  Legrand Como, Pharm.D., BCPS, AAHIVP [x]  Salome Arnt, PharmD, BCPS []  Dimitri Ped, PharmD, BCPS []  Vincenza Hews, PharmD, BCPS  Positive urine culture Treated with Levofloxacin, organism sensitive to the same and no further patient follow-up is required at this time.  Genia Del 11/22/2016, 10:15 AM

## 2017-02-09 ENCOUNTER — Ambulatory Visit: Payer: BLUE CROSS/BLUE SHIELD | Admitting: Obstetrics

## 2017-02-17 DIAGNOSIS — R87810 Cervical high risk human papillomavirus (HPV) DNA test positive: Secondary | ICD-10-CM | POA: Insufficient documentation

## 2019-07-02 ENCOUNTER — Emergency Department (HOSPITAL_BASED_OUTPATIENT_CLINIC_OR_DEPARTMENT_OTHER)
Admission: EM | Admit: 2019-07-02 | Discharge: 2019-07-02 | Disposition: A | Discharge status: Home / Self Care | Payer: Self-pay | Attending: Emergency Medicine | Admitting: Emergency Medicine

## 2019-07-02 ENCOUNTER — Emergency Department (HOSPITAL_BASED_OUTPATIENT_CLINIC_OR_DEPARTMENT_OTHER): Payer: Self-pay

## 2019-07-02 ENCOUNTER — Encounter (HOSPITAL_BASED_OUTPATIENT_CLINIC_OR_DEPARTMENT_OTHER): Payer: Self-pay | Admitting: Emergency Medicine

## 2019-07-02 ENCOUNTER — Other Ambulatory Visit: Payer: Self-pay

## 2019-07-02 DIAGNOSIS — R1084 Generalized abdominal pain: Secondary | ICD-10-CM | POA: Insufficient documentation

## 2019-07-02 DIAGNOSIS — A599 Trichomoniasis, unspecified: Secondary | ICD-10-CM | POA: Insufficient documentation

## 2019-07-02 DIAGNOSIS — R1900 Intra-abdominal and pelvic swelling, mass and lump, unspecified site: Secondary | ICD-10-CM | POA: Insufficient documentation

## 2019-07-02 DIAGNOSIS — J45909 Unspecified asthma, uncomplicated: Secondary | ICD-10-CM | POA: Insufficient documentation

## 2019-07-02 HISTORY — DX: Gastric ulcer, unspecified as acute or chronic, without hemorrhage or perforation: K25.9

## 2019-07-02 HISTORY — DX: Anemia, unspecified: D64.9

## 2019-07-02 LAB — URINALYSIS, ROUTINE W REFLEX MICROSCOPIC
Bilirubin Urine: NEGATIVE
Glucose, UA: NEGATIVE mg/dL
Ketones, ur: NEGATIVE mg/dL
Nitrite: NEGATIVE
Protein, ur: NEGATIVE mg/dL
Specific Gravity, Urine: 1.02 (ref 1.005–1.030)
pH: 7 (ref 5.0–8.0)

## 2019-07-02 LAB — LIPASE, BLOOD: Lipase: 29 U/L (ref 11–51)

## 2019-07-02 LAB — URINALYSIS, MICROSCOPIC (REFLEX)

## 2019-07-02 LAB — CBC WITH DIFFERENTIAL/PLATELET
Abs Immature Granulocytes: 0.03 10*3/uL (ref 0.00–0.07)
Basophils Absolute: 0 10*3/uL (ref 0.0–0.1)
Basophils Relative: 0 %
Eosinophils Absolute: 0.2 10*3/uL (ref 0.0–0.5)
Eosinophils Relative: 2 %
HCT: 39.2 % (ref 36.0–46.0)
Hemoglobin: 12.9 g/dL (ref 12.0–15.0)
Immature Granulocytes: 0 %
Lymphocytes Relative: 18 %
Lymphs Abs: 1.5 10*3/uL (ref 0.7–4.0)
MCH: 28.9 pg (ref 26.0–34.0)
MCHC: 32.9 g/dL (ref 30.0–36.0)
MCV: 87.7 fL (ref 80.0–100.0)
Monocytes Absolute: 0.4 10*3/uL (ref 0.1–1.0)
Monocytes Relative: 5 %
Neutro Abs: 5.9 10*3/uL (ref 1.7–7.7)
Neutrophils Relative %: 75 %
Platelets: 305 10*3/uL (ref 150–400)
RBC: 4.47 MIL/uL (ref 3.87–5.11)
RDW: 12.8 % (ref 11.5–15.5)
WBC: 7.9 10*3/uL (ref 4.0–10.5)
nRBC: 0 % (ref 0.0–0.2)

## 2019-07-02 LAB — COMPREHENSIVE METABOLIC PANEL
ALT: 73 U/L — ABNORMAL HIGH (ref 0–44)
AST: 76 U/L — ABNORMAL HIGH (ref 15–41)
Albumin: 3.8 g/dL (ref 3.5–5.0)
Alkaline Phosphatase: 98 U/L (ref 38–126)
Anion gap: 12 (ref 5–15)
BUN: 12 mg/dL (ref 6–20)
CO2: 27 mmol/L (ref 22–32)
Calcium: 8.5 mg/dL — ABNORMAL LOW (ref 8.9–10.3)
Chloride: 100 mmol/L (ref 98–111)
Creatinine, Ser: 0.67 mg/dL (ref 0.44–1.00)
GFR calc Af Amer: >60 mL/min (ref >60)
GFR calc non Af Amer: >60 mL/min (ref >60)
Glucose, Bld: 131 mg/dL — ABNORMAL HIGH (ref 70–99)
Potassium: 3.5 mmol/L (ref 3.5–5.1)
Sodium: 139 mmol/L (ref 135–145)
Total Bilirubin: 0.5 mg/dL (ref 0.3–1.2)
Total Protein: 7.5 g/dL (ref 6.5–8.1)

## 2019-07-02 LAB — PREGNANCY, URINE: Preg Test, Ur: NEGATIVE

## 2019-07-02 LAB — OCCULT BLOOD X 1 CARD TO LAB, STOOL: Fecal Occult Bld: NEGATIVE

## 2019-07-02 MED ORDER — FAMOTIDINE 20 MG PO TABS: 20.0000 mg | ORAL_TABLET | Freq: Two times a day (BID) | ORAL | 0 refills | Status: DC

## 2019-07-02 MED ORDER — SODIUM CHLORIDE 0.9 % IV BOLUS
500.0000 mL | Freq: Once | INTRAVENOUS | Status: AC
  Administered 2019-07-02: 500 mL via INTRAVENOUS

## 2019-07-02 MED ORDER — ONDANSETRON HCL 4 MG PO TABS: 4.0000 mg | ORAL_TABLET | Freq: Four times a day (QID) | ORAL | 0 refills | Status: DC

## 2019-07-02 MED ORDER — PANTOPRAZOLE SODIUM 40 MG IV SOLR
40.0000 mg | Freq: Once | INTRAVENOUS | Status: AC
  Administered 2019-07-02: 40 mg via INTRAVENOUS
  Filled 2019-07-02: qty 40

## 2019-07-02 MED ORDER — IOHEXOL 300 MG/ML  SOLN
100.0000 mL | Freq: Once | INTRAMUSCULAR | Status: AC | PRN
  Administered 2019-07-02: 100 mL via INTRAVENOUS

## 2019-07-02 MED ORDER — GADOBUTROL 1 MMOL/ML IV SOLN
10.0000 mL | Freq: Once | INTRAVENOUS | Status: AC | PRN
  Administered 2019-07-02: 10 mL via INTRAVENOUS

## 2019-07-02 MED ORDER — METRONIDAZOLE 500 MG PO TABS
2000.0000 mg | ORAL_TABLET | Freq: Once | ORAL | Status: AC
  Administered 2019-07-02: 2000 mg via ORAL
  Filled 2019-07-02: qty 4

## 2019-07-02 MED ORDER — MORPHINE SULFATE (PF) 4 MG/ML IV SOLN
4.0000 mg | Freq: Once | INTRAVENOUS | Status: AC
  Administered 2019-07-02: 4 mg via INTRAVENOUS
  Filled 2019-07-02: qty 1

## 2019-07-02 MED ORDER — OXYCODONE-ACETAMINOPHEN 5-325 MG PO TABS: 1.0000 | ORAL_TABLET | Freq: Four times a day (QID) | ORAL | 0 refills | Status: DC | PRN

## 2019-07-02 NOTE — Discharge Instructions (Signed)
Begin taking Pepcid twice daily in addition to your Prilosec.  Take Zofran every 6 hours as needed for nausea or vomiting.  For severe pain, take 1-2 Percocet every 6 hours.  Do not drive or operate machinery while taking this medication.  Please follow-up with Dr. Denman George, gynecologic oncologist, for further evaluation and treatment of your ovarian mass.  Please return to the emergency department if you develop any new or worsening symptoms.  Do not drink alcohol, drive, operate machinery or participate in any other potentially dangerous activities while taking opiate pain medication as it may make you sleepy. Do not take this medication with any other sedating medications, either prescription or over-the-counter. If you were prescribed Percocet or Vicodin, do not take these with acetaminophen (Tylenol) as it is already contained within these medications and overdose of Tylenol is dangerous.   This medication is an opiate (or narcotic) pain medication and can be habit forming.  Use it as little as possible to achieve adequate pain control.  Do not use or use it with extreme caution if you have a history of opiate abuse or dependence. This medication is intended for your use only - do not give any to anyone else and keep it in a secure place where nobody else, especially children, have access to it. It will also cause or worsen constipation, so you may want to consider taking an over-the-counter stool softener while you are taking this medication.

## 2019-07-02 NOTE — ED Provider Notes (Signed)
Signout from [place], [place] at shift change See previous providers note for full H&P  Briefly, patient presenting with abdominal pain, bloody stools.  She is found to have a pelvic mass concerning for neoplasm, per radiology.  They recommend MR of the abdomen, which is pending at shift change.  Plan to follow-up with OB/GYN.  Patient does have history of PUD and is currently taking a PPI, but will add Pepcid.  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  I discussed the possibility of a neoplasm and the importance of following up with OB/GYN oncology, as planned and discussed with Dr. Denman George prior to shift change.  Will discharge patient home with pain control with Percocet, Zofran, and Pepcid as planned.  Return precautions discussed.  Patient understands and agrees with plan.  Patient vitals stable throughout ED course and discharged in satisfactory condition.   Frederica Kuster, PA-C 07/02/19 2250    Malvin Johns, MD 07/02/19 [phone]

## 2019-07-02 NOTE — ED Provider Notes (Signed)
[facility]ERGENCY DEPARTMENT Provider Note   CSN: [id number] Arrival date & time: 07/02/19  1119     History   Chief Complaint Chief Complaint  Patient presents with   Abdominal Pain   Melena    HPI Sarah Newman is a 43 y.o. female presents today for abdominal pain and bloating x1 month.  Patient describes a generalized abdominal pain throbbing aching constant no clear aggravating or alleviating factors and nonradiating.  She reports it has gradually worsened with 1 month and she feels her abdomen is "bloated".  She reports she has never had this before.  Additionally patient endorses intermittent nausea small amounts of emesis with red streaks for the past 1 month.  Reports dark red appearing stool x1 month.  Denies fevers/chills, headache, neck pain, chest pain/shortness of breath, hemoptysis, active nausea/vomiting, diarrhea, dysuria/hematuria, constipation, vaginal bleeding/discharge, concern for STI, extremity pain/swelling or any additional concerns.  Of note patient reports history of peptic ulcer disease she reports taking her Prilosec as prescribed by [facility] gastroenterology.  She has not followed up with them in the past 1 year.    HPI  Past Medical History:  Diagnosis Date   Anemia    Asthma    Ulcer, stomach peptic     Patient Active Problem List   Diagnosis Date Noted   Cholecystitis 02/16/2015    Past Surgical History:  Procedure Laterality Date   CHOLECYSTECTOMY N/A 02/16/2015   Procedure: LAPAROSCOPIC CHOLECYSTECTOMY WITH INTRAOPERATIVE CHOLANGIOGRAM;  Surgeon: Johnathan Hausen, MD;  Location: [facility] OR[facility];  Service: General;  Laterality: N/A;     OB History   No obstetric history on file.      Home Medications    Prior to Admission medications   Medication Sig Start Date End Date Taking? Authorizing Provider  Ferrous Sulfate (IRON PO) Take by mouth.   Yes [provider]  Omeprazole (PRILOSEC PO) Take 2 capsules by  mouth daily.   Yes [provider]  albuterol (PROVENTIL HFA;VENTOLIN HFA) 108 (90 Base) MCG/ACT inhaler Inhale 2 puffs into the lungs every 4 (four) hours as needed for wheezing or shortness of breath.    [provider]  cephALEXin (KEFLEX) 500 MG capsule Take 1 capsule (500 mg total) by mouth 2 (two) times daily. Patient not taking: Reported on 11/18/2016 07/14/16   Frederica Kuster, PA-C  levofloxacin (LEVAQUIN) 500 MG tablet Take 1 tablet (500 mg total) by mouth daily. 11/18/16   Harris, Abigail, PA-C  ondansetron (ZOFRAN) 4 MG tablet Take 1 tablet (4 mg total) by mouth every 6 (six) hours. Patient not taking: Reported on 11/18/2016 07/14/16   Frederica Kuster, PA-C  pantoprazole (PROTONIX) 20 MG tablet Take 1 tablet (20 mg total) by mouth daily. Patient not taking: Reported on 11/18/2016 07/14/16   Frederica Kuster, PA-C  sucralfate (CARAFATE) 1 GM/10ML suspension Take 10 mLs (1 g total) by mouth 4 (four) times daily -  with meals and at bedtime. Patient not taking: Reported on 11/18/2016 07/14/16   Frederica Kuster, PA-C    Family History No family history on file.  Social History Social History   Tobacco Use   Smoking status: Never Smoker   Smokeless tobacco: Never Used  Substance Use Topics   Alcohol use: Yes    Comment: Social drinker   Drug use: No     Allergies   Patient has no known allergies.   Review of Systems Review of Systems Ten systems are reviewed and are negative for  acute change except as noted in the HPI   Physical Exam Updated Vital Signs BP (!) 149/95    Pulse 79    Temp 98.7 F (37.1 C) (Oral)    Resp 18    Ht 5\' 2"  (1.575 m)    Wt 104.3 kg    LMP 06/13/2019 (Exact Date)    SpO2 98%    BMI 42.07 kg/m   Physical Exam Constitutional:      General: She is not in acute distress.    Appearance: Normal appearance. She is well-developed. She is not ill-appearing or diaphoretic.  HENT:     Head: Normocephalic and atraumatic.      Right Ear: External ear normal.     Left Ear: External ear normal.     Nose: Nose normal.  Eyes:     General: Vision grossly intact. Gaze aligned appropriately.     Pupils: Pupils are equal, round, and reactive to light.  Neck:     Musculoskeletal: Normal range of motion.     Trachea: Trachea and phonation normal. No tracheal deviation.  Cardiovascular:     Rate and Rhythm: Normal rate and regular rhythm.     Heart sounds: Normal heart sounds.  Pulmonary:     Effort: Pulmonary effort is normal. No respiratory distress.     Breath sounds: Normal breath sounds.  Abdominal:     General: Bowel sounds are normal. There is no distension.     Palpations: Abdomen is soft.     Tenderness: There is generalized abdominal tenderness. There is no guarding or rebound.  Genitourinary:    Comments: Rectal examination chaperoned by Mathis Fare.  No external hemorrhoids, no palpable internal hemorrhoids or fissures.  Light brown stool, no gross blood.  Normal rectal tone. - Patient refused pelvic exam x2. Musculoskeletal: Normal range of motion.  Skin:    General: Skin is warm and dry.  Neurological:     Mental Status: She is alert.     GCS: GCS eye subscore is 4. GCS verbal subscore is 5. GCS motor subscore is 6.     Comments: Speech is clear and goal oriented, follows commands Major Cranial nerves without deficit, no facial droop Moves extremities without ataxia, coordination intact  Psychiatric:        Behavior: Behavior normal.      ED Treatments / Results  Labs (all labs ordered are listed, but only abnormal results are displayed) Labs Reviewed  COMPREHENSIVE METABOLIC PANEL - Abnormal; Notable for the following components:      Result Value   Glucose, Bld 131 (*)    Calcium 8.5 (*)    AST 76 (*)    ALT 73 (*)    All other components within normal limits  URINALYSIS, ROUTINE W REFLEX MICROSCOPIC - Abnormal; Notable for the following components:   APPearance CLOUDY (*)    Hgb  urine dipstick TRACE (*)    Leukocytes,Ua SMALL (*)    All other components within normal limits  URINALYSIS, MICROSCOPIC (REFLEX) - Abnormal; Notable for the following components:   Bacteria, UA MANY (*)    Trichomonas, UA PRESENT (*)    All other components within normal limits  URINE CULTURE  CBC WITH DIFFERENTIAL/PLATELET  LIPASE, BLOOD  PREGNANCY, URINE  OCCULT BLOOD X 1 CARD TO LAB, STOOL  GC/CHLAMYDIA PROBE AMP ([place]) NOT AT [facility]    EKG None  Radiology Ct Abdomen Pelvis W Contrast  Result Date: 07/02/2019 CLINICAL DATA:  Bloating and vomiting. History  of peptic ulcer disease. Blood in vomit. Black stools. EXAM: CT ABDOMEN AND PELVIS WITH CONTRAST TECHNIQUE: Multidetector CT imaging of the abdomen and pelvis was performed using the standard protocol following bolus administration of intravenous contrast. CONTRAST:  173mL OMNIPAQUE IOHEXOL 300 MG/ML  SOLN COMPARISON:  July 14, 2016 FINDINGS: Lower chest: No acute abnormality. Hepatobiliary: Previous cholecystectomy. Hepatic steatosis. No liver masses are identified. Portal vein is patent. Pancreas: Unremarkable. No pancreatic ductal dilatation or surrounding inflammatory changes. Spleen: Normal in size without focal abnormality. Adrenals/Urinary Tract: Adrenal glands are unremarkable. Kidneys are normal, without renal calculi, focal lesion, or hydronephrosis. Bladder is unremarkable. Stomach/Bowel: The stomach is normal in appearance. The small bowel, including the duodenum is normal in appearance. Colon is normal. The appendix is normal. Vascular/Lymphatic: No significant vascular findings are present. No enlarged abdominal or pelvic lymph nodes. Reproductive: There is a complex cystic mass in the superior central pelvis with multiple thickened septations. The mass measures 16 by 10.6 by 14.2 cm in craniocaudal, AP, and transverse dimensions. This mass is favored to be adnexal/ovarian rather than uterine in origin. The mass  does abut the fundus of the uterus however. The complex cystic mass also abuts a more solid-appearing rounded structure in the right pelvis measuring up to 3.4 cm on series 2, image 72, perhaps the right ovary. I suspect the complex cystic mass may arise from the left adnexa/left ovary. Other: There is a small amount of free fluid in the pelvis which is likely secondary to the large complex cystic mass with multiple septations. No free air. No adenopathy. No peritoneal or omental disease identified. Musculoskeletal: No acute or significant osseous findings. IMPRESSION: 1. There is a 16 cm complex cystic mass in the pelvis containing thickened septations located near midline, abutting the superior aspect of the uterus. I suspect this mass is adnexal/ovarian in origin rather than uterine in origin. Specifically, I suspect the mass likely arises from the left ovary/adnexa and is most consistent with a neoplasm. Malignancy is certainly not excluded on this study. Recommend a pelvic ultrasound for further evaluation. 2. The rounded more solid-appearing structure in the right side of the pelvis is probably the right ovary. Recommend attention on ultrasound. 3. The small amount of fluid in the pelvis is likely reactive to the complex cystic mass likely rising from the left ovary/adnexa. 4. No cause for blood in vomit or black stools identified. No convincing evidence of a perforated or inflamed gastric or duodenal ulcer. 5. Hepatic steatosis. Electronically Signed   By: Dorise Bullion III M.D   On: 07/02/2019 14:16   US Pelvic Complete With Transvaginal  Result Date: 07/02/2019 CLINICAL DATA:  Abnormal CT scan. Pain. Family history of ovarian cancer. History of PID. EXAM: TRANSABDOMINAL AND TRANSVAGINAL ULTRASOUND OF PELVIS TECHNIQUE: Both transabdominal and transvaginal ultrasound examinations of the pelvis were performed. Transabdominal technique was performed for global imaging of the pelvis including uterus,  ovaries, adnexal regions, and pelvic cul-de-sac. It was necessary to proceed with endovaginal exam following the transabdominal exam to visualize the endometrium and ovaries. COMPARISON:  CT scan July 02, 2019.  CT scan July 14, 2016. FINDINGS: Uterus Measurements: 8.2 x 4.5 x 6 cm = volume: 117.1 mL. Complex nabothian cysts are identified. A 3.4 x 3.6 x 3.1 cm fibroid is identified in the right uterine body. Endometrium Thickness: 6 mm.  No focal abnormality visualized. Right ovary Measurements: 3.3 x 3 x 2.5 cm = volume: 12.5 mL. There is a single follicle in the periphery  of the right ovary. Centrally, there is an ill-defined hypoechoic hypervascular region in the right ovary. Left ovary There is a large mass in the pelvis seen on recent CT imaging which measures 15.9 x 9.7 x 15 cm on this study. The mass demonstrates solid and cystic components with multiple thickened septations. There is internal blood flow. A separate left ovary is not seen. Other findings A small amount of fluid is seen in the pelvis. IMPRESSION: 1. There is a large mass in the pelvis also seen on CT imaging. A separate left ovary is not visualized. 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 The mass is suspicious for malignancy. Recommend gynecologic consultation. 2. The right ovary demonstrates an irregular ill-defined hypoechoic hypervascular region centrally. The findings are nonspecific in the right ovary. However, given the apparent left ovarian neoplasm suspicious for malignancy, recommend an MRI to further assess the right ovary. 3. Uterine fibroid measuring 3.6 cm. Electronically Signed   By: Dorise Bullion III M.D   On: 07/02/2019 16:16    Procedures Procedures (including critical care time)  Medications Ordered in ED Medications  metroNIDAZOLE (FLAGYL) tablet 2,000 mg (has no administration in time range)  pantoprazole (PROTONIX) injection 40 mg (40 mg Intravenous Given  07/02/19 1210)  iohexol (OMNIPAQUE) 300 MG/ML solution 100 mL (100 mLs Intravenous Contrast Given 07/02/19 1322)  sodium chloride 0.9 % bolus 500 mL (500 mLs Intravenous New Bag/Given 07/02/19 1631)     Initial Impression / Assessment and Plan / ED Course  I have reviewed the triage vital signs and the nursing notes.  Pertinent labs & imaging results that were available during my care of the patient were reviewed by me and considered in my medical decision making (see chart for details).  Clinical Course as of Jul 01 1714  Sat Jul 02, 2019  1637 Dr. Briant Sites; gyn   [BM]  [phone] Dr. Denman George   [BM]    Clinical Course User Index [BM] Deliah Boston, PA-C   43 year old female with 1 month of abdominal pain, distention, intermittent nausea with vomiting.  Reports occasional bloody streaks in stool and emesis.  Abdominal distention.  On evaluation patient is well-appearing and in no acute distress, no neuro deficits, heart regular rate and rhythm, lungs clear, abdomen is obese, questionable distention of the lower abdomen no peritoneal signs.  Patient refuses pelvic examination on initial evaluation.  No active vomiting. - CBC within normal limits CMP with AST 76 ALT 73 Lipase within normal limits Urinalysis with leukocytes, many bacteria, 6-10 squamous cells, trichomonas present, 11-20 WBCs.  Will send for culture. Hemoccult negative  Patient informed of trichomonas present in the urine, her husband stepped out of the room for conversation.  Patient states understanding of diagnosis and does not want a pelvic exam in this emergency department.  She is agreeable to [facility] chlamydia testing via urine.  She states understanding that all sexual partners will be tested and treated for STI including trichomonas before returning to sexual activity.  Discussed safe sex practices, patient states understanding that GC chlamydia is pending and will result in 2-3 days and she will check her MyChart account  for results.  Urinalysis is suggestive of UTI but patient denies any symptoms today, possibly secondary to trichomonas we will treat today with 2 g p.o. Flagyl, will send urine for culture.  She will follow-up with PCP and OB/GYN. - CT abdomen pelvis:  IMPRESSION:  1. There is a 16 cm  complex cystic mass in the pelvis containing  thickened septations located near midline, abutting the superior  aspect of the uterus. I suspect this mass is adnexal/ovarian in  origin rather than uterine in origin. Specifically, I suspect the  mass likely arises from the left ovary/adnexa and is most consistent  with a neoplasm. Malignancy is certainly not excluded on this study.  Recommend a pelvic ultrasound for further evaluation.  2. The rounded more solid-appearing structure in the right side of  the pelvis is probably the right ovary. Recommend attention on  ultrasound.  3. The small amount of fluid in the pelvis is likely reactive to the  complex cystic mass likely rising from the left ovary/adnexa.  4. No cause for blood in vomit or black stools identified. No  convincing evidence of a perforated or inflamed gastric or duodenal  ulcer.  5. Hepatic steatosis.   Pelvic US:  IMPRESSION:  1. There is a large mass in the pelvis also seen on CT imaging. A  separate left ovary is not visualized. 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 The mass is  suspicious for malignancy. Recommend gynecologic consultation.  2. The right ovary demonstrates an irregular ill-defined hypoechoic  hypervascular region centrally. The findings are nonspecific in the  right ovary. However, given the apparent left ovarian neoplasm  suspicious for malignancy, recommend an MRI to further assess the  right ovary.  3. Uterine fibroid measuring 3.6 cm.   Results were discussed with patient at length she states understanding that further work-up is needed prior to a definitive diagnosis,  she states understanding of need for follow-up and plans to do so. - Discussed case with on-call gynecologist who recommends patient follow-up as an outpatient with the oncology gynecologist Dr. Serita Grit office.  On-call gynecologist is sending referral to the office patient be contacted by [facility] long schedulers. - Discussed case with Dr. Denman George who agrees with outpatient follow-up with her office. - Patient reassessed resting comfortably and in no acute distress.  No vomiting since arrival to ER, there are no signs of anemia, Hemoccult is negative.  CT scan does not show evidence of inflammation or infection.  Suspect patient's nausea and vomiting may be secondary to large mass today.  Plan discharge home for her to continue her Prilosec, will add Pepcid and she can follow-up with her gastroenterologist as outpatient for possible hematemesis related to peptic ulcer disease.  No indication for admission at this time.  She is awaiting her MRI studies at this time, MRI is available at our facility today.  Anticipate discharge with outpatient oncology/gynecology follow-up.  Care handoff given to Armstead Peaks, PA-C at shift change, final disposition per oncoming team.  Case discussed with Dr. Tamera Punt during this visit.  Note: Portions of this report may have been transcribed using voice recognition software. Every effort was made to ensure accuracy; however, inadvertent computerized transcription errors may still be present. Final Clinical Impressions(s) / ED Diagnoses   Final diagnoses:  Generalized abdominal pain  Trichimoniasis  Pelvic mass    ED Discharge Orders    None       Gari Crown 07/02/19 1718    Malvin Johns, MD 07/02/19 1750

## 2019-07-02 NOTE — ED Notes (Signed)
Patient transported to MRI 

## 2019-07-02 NOTE — ED Triage Notes (Signed)
Pt here with lower right and left abdominal pain x 1 month that is getting worse. Noticing red in stools and vomiting red. Is unable to keep anything down.

## 2019-07-04 ENCOUNTER — Other Ambulatory Visit: Payer: Self-pay | Admitting: Obstetrics and Gynecology

## 2019-07-04 ENCOUNTER — Telehealth: Payer: Self-pay | Admitting: *Deleted

## 2019-07-04 DIAGNOSIS — N838 Other noninflammatory disorders of ovary, fallopian tube and broad ligament: Secondary | ICD-10-CM

## 2019-07-04 LAB — URINE CULTURE

## 2019-07-04 LAB — GC/CHLAMYDIA PROBE AMP (~~LOC~~) NOT AT ARMC
Chlamydia: NEGATIVE
Neisseria Gonorrhea: NEGATIVE

## 2019-07-04 MED ORDER — OXYCODONE-ACETAMINOPHEN 5-325 MG PO TABS: 1.0000 | ORAL_TABLET | Freq: Four times a day (QID) | ORAL | 0 refills | Status: DC | PRN

## 2019-07-05 DIAGNOSIS — C562 Malignant neoplasm of left ovary: Secondary | ICD-10-CM

## 2019-07-05 HISTORY — DX: Malignant neoplasm of left ovary: C56.2

## 2019-07-05 NOTE — Telephone Encounter (Signed)
Spoke with about appt

## 2019-07-11 ENCOUNTER — Other Ambulatory Visit: Payer: Self-pay

## 2019-07-11 ENCOUNTER — Inpatient Hospital Stay (HOSPITAL_BASED_OUTPATIENT_CLINIC_OR_DEPARTMENT_OTHER): Payer: BC Managed Care – PPO | Admitting: Gynecologic Oncology

## 2019-07-11 ENCOUNTER — Inpatient Hospital Stay: Payer: BC Managed Care – PPO | Attending: Gynecologic Oncology

## 2019-07-11 ENCOUNTER — Other Ambulatory Visit: Payer: Self-pay | Admitting: Gynecologic Oncology

## 2019-07-11 ENCOUNTER — Encounter: Payer: Self-pay | Admitting: Gynecologic Oncology

## 2019-07-11 VITALS — BP 139/104 | HR 101 | Temp 97.6°F | Resp 17 | Ht 62.0 in | Wt 236.6 lb

## 2019-07-11 DIAGNOSIS — D3912 Neoplasm of uncertain behavior of left ovary: Secondary | ICD-10-CM | POA: Insufficient documentation

## 2019-07-11 DIAGNOSIS — R1900 Intra-abdominal and pelvic swelling, mass and lump, unspecified site: Secondary | ICD-10-CM

## 2019-07-11 DIAGNOSIS — J45909 Unspecified asthma, uncomplicated: Secondary | ICD-10-CM | POA: Insufficient documentation

## 2019-07-11 DIAGNOSIS — R350 Frequency of micturition: Secondary | ICD-10-CM | POA: Insufficient documentation

## 2019-07-11 DIAGNOSIS — E669 Obesity, unspecified: Secondary | ICD-10-CM | POA: Insufficient documentation

## 2019-07-11 DIAGNOSIS — K760 Fatty (change of) liver, not elsewhere classified: Secondary | ICD-10-CM | POA: Diagnosis not present

## 2019-07-11 DIAGNOSIS — R6881 Early satiety: Secondary | ICD-10-CM | POA: Insufficient documentation

## 2019-07-11 DIAGNOSIS — D251 Intramural leiomyoma of uterus: Secondary | ICD-10-CM | POA: Diagnosis not present

## 2019-07-11 DIAGNOSIS — R630 Anorexia: Secondary | ICD-10-CM | POA: Insufficient documentation

## 2019-07-11 DIAGNOSIS — K279 Peptic ulcer, site unspecified, unspecified as acute or chronic, without hemorrhage or perforation: Secondary | ICD-10-CM | POA: Diagnosis not present

## 2019-07-11 DIAGNOSIS — Z79899 Other long term (current) drug therapy: Secondary | ICD-10-CM | POA: Diagnosis not present

## 2019-07-11 DIAGNOSIS — R112 Nausea with vomiting, unspecified: Secondary | ICD-10-CM | POA: Insufficient documentation

## 2019-07-11 MED ORDER — SENNOSIDES-DOCUSATE SODIUM 8.6-50 MG PO TABS: 2.0000 | ORAL_TABLET | Freq: Every day | ORAL | 1 refills | Status: DC

## 2019-07-11 MED ORDER — OXYCODONE HCL 5 MG PO TABS: 5.0000 mg | ORAL_TABLET | ORAL | 0 refills | Status: DC | PRN

## 2019-07-11 MED ORDER — IBUPROFEN 800 MG PO TABS: 800.0000 mg | ORAL_TABLET | Freq: Three times a day (TID) | ORAL | 1 refills | Status: DC | PRN

## 2019-07-11 NOTE — H&P (View-Only) (Signed)
GYNECOLOGIC ONCOLOGY NEW PATIENT CONSULTATION   Patient Name: Sarah Newman  Patient Age: 43 y.o. Date of Service: 07/11/19 Referring Provider: No referring provider defined for this encounter.   Primary Care Provider: System, Pcp Not In Consulting Provider: Jeral Pinch, MD   Assessment/Plan:  43 year old with significant family history presenting with a 16 cm complex adnexal mass and 2 indeterminate but suspicious nodules in the upper abdomen.  I reviewed in depth with the patient's results from her recent CT scan, ultrasound, and MRI.  We discussed the significance of the complex findings on her pelvic mass as well as the possible etiologies of her upper abdominal nodules.  She understands the reason for concern for ovarian cancer and my recommendation to proceed with surgery.  In terms of ovarian cancer, we discussed 2 possible treatment options including surgery followed by adjuvant treatment as indicated versus biopsy with neoadjuvant chemotherapy and interval surgery.  I would prefer not to biopsy her ovarian mass and I suspect that the 2 small upper abdominal nodules would be difficult to biopsy.  Given her symptoms related to the ovarian mass, I think it best to proceed with surgery for diagnosis and treatment.  Given the cyst size, I recommend proceeding with a laparotomy for unilateral salpingo-oophorectomy.  We will plan to send the mass to pathology for frozen section.  If no malignancy confirmed, then I will palpate and inspect the upper abdomen for the visualized nodules and remove if feasible and possible at least for biopsy.  If ovarian cancer suspected on frozen section, then we discussed staging surgery to include omentectomy, pelvic and para-aortic lymphadenectomy, possible total hysterectomy and contralateral salpingo-oophorectomy, and tumor debulking including resection of the upper abdominal nodules if feasible.  We will plan to obtain a CA-125 today.  The patient was  provided a limited quantity of narcotics for breakthrough pain prior to surgery.  Surgery was scheduled for next Thursday, 12/17.   The risks of surgery were discussed in detail and she understands these to include infection; wound separation; hernia; vaginal cuff separation, injury to adjacent organs such as bowel, bladder, blood vessels, ureters and nerves; bleeding which may require blood transfusion; anesthesia risk; thromboembolic events; possible death; unforeseen complications; possible need for re-exploration; medical complications such as heart attack, stroke, pleural effusion and pneumonia; and, if full lymphadenectomy is performed the risk of lymphedema and lymphocyst. The patient will receive DVT and antibiotic prophylaxis as indicated. She voiced a clear understanding. She had the opportunity to ask questions. Perioperative instructions were reviewed with her.   A copy of this note was sent to the patient's referring provider.   Jeral Pinch, MD  Gynecologic Oncology ___________________________________________  Chief Complaint: Chief Complaint  Patient presents with  . Pelvic mass    History of Present Illness:  Sarah Newman is a 43 y.o. y.o. female who is seen in consultation at the request of No ref. provider found for an evaluation of a pelvic mass.  The patient reports a history of abdominal distention and increasing abdominal girth as well as pain for 1 week preceding her presentation to the emergency department on 11/28.  She notes similar symptoms several years ago when she was ultimately diagnosed with a peptic ulcer and underwent endoscopy and colonoscopy.  She figured that her symptoms were related to the same process.  At that visit, she also endorsed bloody stools, intermittent nausea and small episodes of emesis streaked with blood.  Her urine revealed a trichomonas infection.  A CT of the  abdomen and pelvis showed a 16 cm complex cystic mass containing  thickened septations, suspected to be arising from the left ovary.  The right ovary demonstrated an irregular ill-defined hypoechoic hypervascular region centrally.  Today, the patient describes decreased appetite as well as intermittent nausea and rare episodes of emesis.  She has had early satiety for the same period of time as her abdominal distention.  She used magnesium citrate this weekend which helped alleviate her distention somewhat.  She describes associated abdominal tightness.  She has looser bowel movements very shortly after eating which is unchanged since her gallbladder was removed in 2016.  She describes urinary frequency but otherwise denies any urinary symptoms.  Patient's last menstrual period was a week ago and lasted for 4 days which she described as brown.  She notes normal menses up until October.  She was married in October and her menses was late that month.  When it finally came, she bled for 2 weeks, stop bleeding for a week, and then had another menstrual cycle.  She has started to have some cramping before her menses.  She notes her last Pap smear was in August 2018 and normal.  Her history is notable for peptic ulcer disease and asthma.  She follows with [facility] gastroenterology for history of peptic ulcer disease for which she takes Prilosec.  She has not seen them for at least a year. Her asthma is triggered by allergens, such as dust and pollen.  Patient reports intermittent and occasional albuterol use.  PAST MEDICAL HISTORY:  Past Medical History:  Diagnosis Date  . Anemia   . Asthma   . Pelvic mass   . Ulcer, stomach peptic      PAST SURGICAL HISTORY:  Past Surgical History:  Procedure Laterality Date  . CHOLECYSTECTOMY N/A 02/16/2015   Procedure: LAPAROSCOPIC CHOLECYSTECTOMY WITH INTRAOPERATIVE CHOLANGIOGRAM;  Surgeon: Johnathan Hausen, MD;  Location: [facility];  Service: General;  Laterality: N/A;    OB/GYN HISTORY:  OB History  Gravida Para Term Preterm AB  Living  3 3       2   SAB TAB Ectopic Multiple Live Births          2    # Outcome Date GA Lbr Len/2nd Weight Sex Delivery Anes PTL Lv  3 Para           2 Para           1 Para             Patient's last menstrual period was 06/13/2019 (exact date).  Age at menarche: 73  Age at menopause: n/a Hx of HRT: no, has used OCPs and an IUD previously for birth control Hx of STDs: Recently treated for trichomonas Last pap: 2018 History of abnormal pap smears: no  SCREENING STUDIES:  Last mammogram: 2018  Last colonoscopy: several years ago Last bone mineral density: n/a  MEDICATIONS: Outpatient Encounter Medications as of 07/11/2019  Medication Sig  . acetaminophen (TYLENOL) 500 MG tablet Take 500-1,000 mg by mouth every 6 (six) hours as needed.  Marland Kitchen albuterol (PROVENTIL HFA;VENTOLIN HFA) 108 (90 Base) MCG/ACT inhaler Inhale 2 puffs into the lungs every 4 (four) hours as needed for wheezing or shortness of breath.  . famotidine (PEPCID) 20 MG tablet Take 1 tablet (20 mg total) by mouth 2 (two) times daily.  . Omeprazole (PRILOSEC PO) Take 2 capsules by mouth daily.  . ondansetron (ZOFRAN) 4 MG tablet Take 1 tablet (4 mg total) by mouth every  6 (six) hours.  Marland Kitchen oxyCODONE-acetaminophen (PERCOCET/ROXICET) 5-325 MG tablet Take 1 tablet by mouth every 6 (six) hours as needed for severe pain.  Marland Kitchen ibuprofen (ADVIL) 800 MG tablet Take 1 tablet (800 mg total) by mouth every 8 (eight) hours as needed for moderate pain. For AFTER surgery only  . oxyCODONE (OXY IR/ROXICODONE) 5 MG immediate release tablet Take 1 tablet (5 mg total) by mouth every 4 (four) hours as needed for severe pain. For AFTER surgery only, do not take and drive  . senna-docusate (SENOKOT-S) 8.6-50 MG tablet Take 2 tablets by mouth at bedtime. For AFTER surgery, do not take if having diarrhea  . [DISCONTINUED] cephALEXin (KEFLEX) 500 MG capsule Take 1 capsule (500 mg total) by mouth 2 (two) times daily. (Patient not taking: Reported on  11/18/2016)  . [DISCONTINUED] Ferrous Sulfate (IRON PO) Take by mouth.  . [DISCONTINUED] levofloxacin (LEVAQUIN) 500 MG tablet Take 1 tablet (500 mg total) by mouth daily.  . [DISCONTINUED] pantoprazole (PROTONIX) 20 MG tablet Take 1 tablet (20 mg total) by mouth daily. (Patient not taking: Reported on 11/18/2016)  . [DISCONTINUED] sucralfate (CARAFATE) 1 GM/10ML suspension Take 10 mLs (1 g total) by mouth 4 (four) times daily -  with meals and at bedtime. (Patient not taking: Reported on 11/18/2016)   No facility-administered encounter medications on file as of 07/11/2019.     ALLERGIES:  No Known Allergies   FAMILY HISTORY:  Family History  Problem Relation Age of Onset  . Colon cancer Father   . Prostate cancer Father   . Colon cancer Paternal Aunt   . Ovarian cancer Paternal Grandmother   . Breast cancer Cousin   . Endometriosis Mother      SOCIAL HISTORY:  Relationships  Social connections  . Talks on phone: Not on file  . Gets together: Not on file  . Attends religious service: Not on file  . Active member of club or organization: Not on file  . Attends meetings of clubs or organizations: Not on file  . Relationship status: Not on file    REVIEW OF SYSTEMS:  Reports abdominal pain and headaches. Denies appetite changes, fevers, chills, fatigue, unexplained weight changes. Denies hearing loss, neck lumps or masses, mouth sores, ringing in ears or voice changes. Denies cough or wheezing.  Denies shortness of breath. Denies chest pain or palpitations. Denies leg swelling. Denies abdominal distention, pain, blood in stools, constipation, diarrhea, nausea, vomiting, or early satiety. Denies pain with intercourse, dysuria, frequency, hematuria or incontinence. Denies hot flashes, pelvic pain, vaginal bleeding or vaginal discharge.   Denies joint pain, back pain or muscle pain/cramps. Denies itching, rash, or wounds. Denies dizziness, headaches, numbness or seizures. Denies  swollen lymph nodes or glands, denies easy bruising or bleeding. Denies anxiety, depression, confusion, or decreased concentration.  Physical Exam:  Vital Signs for this encounter:  Blood pressure (!) 139/104, pulse (!) 101, temperature 97.6 F (36.4 C), temperature source Temporal, resp. rate 17, height 5\' 2"  (1.575 m), weight 236 lb 9.6 oz (107.3 kg), last menstrual period 06/13/2019, SpO2 100 %. Body mass index is 43.27 kg/m. General: Alert, oriented, no acute distress.  HEENT: Normocephalic, atraumatic. Sclera anicteric.  Chest: Clear to auscultation bilaterally.  Cardiovascular: Regular rate and rhythm, no murmurs, rubs, or gallops.  Abdomen: Obese. Normoactive bowel sounds. Soft, mildly distended with palpable mass extending out of the pelvis most notably in the periumbilical region spanning 4 to 5 cm above the umbilicus. Moderate tenderness to palpation. No hepatosplenomegaly appreciated.  No palpable fluid wave.  Extremities: Grossly normal range of motion. Warm, well perfused. No edema bilaterally.  Skin: No rashes or lesions.  Lymphatics: No cervical, supraclavicular, or inguinal adenopathy.  GU:  Normal external female genitalia. No lesions. No discharge or bleeding.             Bladder/urethra:  No lesions or masses, well supported bladder             Vagina: Well rugated vaginal mucosa.             Cervix: Normal appearing, no lesions.             Uterus/Adnexa: Uterus indistinct from pelvic mass, moved together, mobile.  No parametrial involvement or nodularity  Rectal: No nodularity.  LABORATORY AND RADIOLOGIC DATA:  CT A/P on 11/28: FINDINGS: Lower chest: No acute abnormality.  Hepatobiliary: Previous cholecystectomy. Hepatic steatosis. No liver masses are identified. Portal vein is patent.  Pancreas: Unremarkable. No pancreatic ductal dilatation or surrounding inflammatory changes.  Spleen: Normal in size without focal abnormality.  Adrenals/Urinary Tract:  Adrenal glands are unremarkable. Kidneys are normal, without renal calculi, focal lesion, or hydronephrosis. Bladder is unremarkable.  Stomach/Bowel: The stomach is normal in appearance. The small bowel, including the duodenum is normal in appearance. Colon is normal. The appendix is normal.  Vascular/Lymphatic: No significant vascular findings are present. No enlarged abdominal or pelvic lymph nodes.  Reproductive: There is a complex cystic mass in the superior central pelvis with multiple thickened septations. The mass measures 16 by 10.6 by 14.2 cm in craniocaudal, AP, and transverse dimensions. This mass is favored to be adnexal/ovarian rather than uterine in origin. The mass does abut the fundus of the uterus however. The complex cystic mass also abuts a more solid-appearing rounded structure in the right pelvis measuring up to 3.4 cm on series 2, image 72, perhaps the right ovary. I suspect the complex cystic mass may arise from the left adnexa/left ovary.  Other: There is a small amount of free fluid in the pelvis which is likely secondary to the large complex cystic mass with multiple septations. No free air. No adenopathy. No peritoneal or omental disease identified.  Musculoskeletal: No acute or significant osseous findings.  IMPRESSION: 1. There is a 16 cm complex cystic mass in the pelvis containing thickened septations located near midline, abutting the superior aspect of the uterus. I suspect this mass is adnexal/ovarian in origin rather than uterine in origin. Specifically, I suspect the mass likely arises from the left ovary/adnexa and is most consistent with a neoplasm. Malignancy is certainly not excluded on this study. Recommend a pelvic ultrasound for further evaluation. 2. The rounded more solid-appearing structure in the right side of the pelvis is probably the right ovary. Recommend attention on ultrasound. 3. The small amount of fluid in the pelvis  is likely reactive to the complex cystic mass likely rising from the left ovary/adnexa. 4. No cause for blood in vomit or black stools identified. No convincing evidence of a perforated or inflamed gastric or duodenal ulcer. 5. Hepatic steatosis.  Pelvic ultrasound on 11/28: FINDINGS: Uterus  Measurements: 8.2 x 4.5 x 6 cm = volume: 117.1 mL. Complex nabothian cysts are identified. A 3.4 x 3.6 x 3.1 cm fibroid is identified in the right uterine body.  Endometrium  Thickness: 6 mm.  No focal abnormality visualized.  Right ovary  Measurements: 3.3 x 3 x 2.5 cm = volume: 12.5 mL. There is a single follicle in the periphery  of the right ovary. Centrally, there is an ill-defined hypoechoic hypervascular region in the right ovary.  Left ovary  There is a large mass in the pelvis seen on recent CT imaging which measures 15.9 x 9.7 x 15 cm on this study. The mass demonstrates solid and cystic components with multiple thickened septations. There is internal blood flow. A separate left ovary is not seen.  Other findings  A small amount of fluid is seen in the pelvis.  IMPRESSION: 1. There is a large mass in the pelvis also seen on CT imaging. A separate left ovary is not visualized. 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 The mass is suspicious for malignancy. Recommend gynecologic consultation. 2. The right ovary demonstrates an irregular ill-defined hypoechoic hypervascular region centrally. The findings are nonspecific in the right ovary. However, given the apparent left ovarian neoplasm suspicious for malignancy, recommend an MRI to further assess the right ovary. 3. Uterine fibroid measuring 3.6 cm.  MRI A/P on 11/28: FINDINGS: COMBINED FINDINGS FOR BOTH MR ABDOMEN AND PELVIS  Lower chest: Unremarkable.  Hepatobiliary: Signal loss throughout the liver on opposed phase imaging compatible with hepatic steatosis.  Gallbladder surgically absent. No intrahepatic or extrahepatic biliary ductal dilatation. No focal hepatic lesion is identified.  Pancreas:  Unremarkable  Spleen:  Unremarkable  Adrenals/Urinary Tract: Normal adrenal glands. Kidneys enhance symmetrically with contrast. No hydronephrosis.  Stomach/Bowel: Visualized small and large bowel is unremarkable. No evidence for bowel obstruction.  Vascular/Lymphatic: Normal caliber abdominal aorta. No retroperitoneal lymphadenopathy.  Reproductive: Within the right aspect of the uterine body there is a 3.0 x 3.2 cm intramural fibroid. The right ovary measures 3.9 x 3.3 cm. Small amount of fluid in the pelvis.  There is a 15 x 10 x 16 cm enhancing multi septated solid and cystic mass which appears to be arising from the left ovary within the left hemipelvis extending cranially into the central abdomen. (Image 19; series 13).  Other: There are two adjacent enhancing nodules within the central upper abdomen just deep to the left hepatic lobe measuring 9 mm in the mm (image 32; series 11) concerning for carcinomatosis.  Musculoskeletal: No aggressive or acute appearing osseous lesions.  IMPRESSION: 1. 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 There are 2 enhancing nodules within the central upper abdomen raising the possibility of peritoneal carcinomatosis. Additionally, there is a small amount fluid within the pelvis. Malignant ascites not excluded. 2. Fibroid uterus. 3. Hepatic steatosis.

## 2019-07-11 NOTE — Progress Notes (Signed)
GYNECOLOGIC ONCOLOGY NEW PATIENT CONSULTATION   Patient Name: Sarah Newman  Patient Age: 43 y.o. Date of Service: 07/11/19 Referring Provider: No referring provider defined for this encounter.   Primary Care Provider: System, Pcp Not In Consulting Provider: Jeral Pinch, MD   Assessment/Plan:  43 year old with significant family history presenting with a 16 cm complex adnexal mass and 2 indeterminate but suspicious nodules in the upper abdomen.  I reviewed in depth with the patient's results from her recent CT scan, ultrasound, and MRI.  We discussed the significance of the complex findings on her pelvic mass as well as the possible etiologies of her upper abdominal nodules.  She understands the reason for concern for ovarian cancer and my recommendation to proceed with surgery.  In terms of ovarian cancer, we discussed 2 possible treatment options including surgery followed by adjuvant treatment as indicated versus biopsy with neoadjuvant chemotherapy and interval surgery.  I would prefer not to biopsy her ovarian mass and I suspect that the 2 small upper abdominal nodules would be difficult to biopsy.  Given her symptoms related to the ovarian mass, I think it best to proceed with surgery for diagnosis and treatment.  Given the cyst size, I recommend proceeding with a laparotomy for unilateral salpingo-oophorectomy.  We will plan to send the mass to pathology for frozen section.  If no malignancy confirmed, then I will palpate and inspect the upper abdomen for the visualized nodules and remove if feasible and possible at least for biopsy.  If ovarian cancer suspected on frozen section, then we discussed staging surgery to include omentectomy, pelvic and para-aortic lymphadenectomy, possible total hysterectomy and contralateral salpingo-oophorectomy, and tumor debulking including resection of the upper abdominal nodules if feasible.  We will plan to obtain a CA-125 today.  The patient was  provided a limited quantity of narcotics for breakthrough pain prior to surgery.  Surgery was scheduled for next Thursday, 12/17.   The risks of surgery were discussed in detail and she understands these to include infection; wound separation; hernia; vaginal cuff separation, injury to adjacent organs such as bowel, bladder, blood vessels, ureters and nerves; bleeding which may require blood transfusion; anesthesia risk; thromboembolic events; possible death; unforeseen complications; possible need for re-exploration; medical complications such as heart attack, stroke, pleural effusion and pneumonia; and, if full lymphadenectomy is performed the risk of lymphedema and lymphocyst. The patient will receive DVT and antibiotic prophylaxis as indicated. She voiced a clear understanding. She had the opportunity to ask questions. Perioperative instructions were reviewed with her.   A copy of this note was sent to the patient's referring provider.   Jeral Pinch, MD  Gynecologic Oncology ___________________________________________  Chief Complaint: Chief Complaint  Patient presents with  . Pelvic mass    History of Present Illness:  Sarah Newman is a 43 y.o. y.o. female who is seen in consultation at the request of No ref. provider found for an evaluation of a pelvic mass.  The patient reports a history of abdominal distention and increasing abdominal girth as well as pain for 1 week preceding her presentation to the emergency department on 11/28.  She notes similar symptoms several years ago when she was ultimately diagnosed with a peptic ulcer and underwent endoscopy and colonoscopy.  She figured that her symptoms were related to the same process.  At that visit, she also endorsed bloody stools, intermittent nausea and small episodes of emesis streaked with blood.  Her urine revealed a trichomonas infection.  A CT of the  abdomen and pelvis showed a 16 cm complex cystic mass containing  thickened septations, suspected to be arising from the left ovary.  The right ovary demonstrated an irregular ill-defined hypoechoic hypervascular region centrally.  Today, the patient describes decreased appetite as well as intermittent nausea and rare episodes of emesis.  She has had early satiety for the same period of time as her abdominal distention.  She used magnesium citrate this weekend which helped alleviate her distention somewhat.  She describes associated abdominal tightness.  She has looser bowel movements very shortly after eating which is unchanged since her gallbladder was removed in 2016.  She describes urinary frequency but otherwise denies any urinary symptoms.  Patient's last menstrual period was a week ago and lasted for 4 days which she described as brown.  She notes normal menses up until October.  She was married in October and her menses was late that month.  When it finally came, she bled for 2 weeks, stop bleeding for a week, and then had another menstrual cycle.  She has started to have some cramping before her menses.  She notes her last Pap smear was in August 2018 and normal.  Her history is notable for peptic ulcer disease and asthma.  She follows with [facility] gastroenterology for history of peptic ulcer disease for which she takes Prilosec.  She has not seen them for at least a year. Her asthma is triggered by allergens, such as dust and pollen.  Patient reports intermittent and occasional albuterol use.  PAST MEDICAL HISTORY:  Past Medical History:  Diagnosis Date  . Anemia   . Asthma   . Pelvic mass   . Ulcer, stomach peptic      PAST SURGICAL HISTORY:  Past Surgical History:  Procedure Laterality Date  . CHOLECYSTECTOMY N/A 02/16/2015   Procedure: LAPAROSCOPIC CHOLECYSTECTOMY WITH INTRAOPERATIVE CHOLANGIOGRAM;  Surgeon: Johnathan Hausen, MD;  Location: [facility];  Service: General;  Laterality: N/A;    OB/GYN HISTORY:  OB History  Gravida Para Term Preterm AB  Living  3 3       2   SAB TAB Ectopic Multiple Live Births          2    # Outcome Date GA Lbr Len/2nd Weight Sex Delivery Anes PTL Lv  3 Para           2 Para           1 Para             Patient's last menstrual period was 06/13/2019 (exact date).  Age at menarche: 67  Age at menopause: n/a Hx of HRT: no, has used OCPs and an IUD previously for birth control Hx of STDs: Recently treated for trichomonas Last pap: 2018 History of abnormal pap smears: no  SCREENING STUDIES:  Last mammogram: 2018  Last colonoscopy: several years ago Last bone mineral density: n/a  MEDICATIONS: Outpatient Encounter Medications as of 07/11/2019  Medication Sig  . acetaminophen (TYLENOL) 500 MG tablet Take 500-1,000 mg by mouth every 6 (six) hours as needed.  Marland Kitchen albuterol (PROVENTIL HFA;VENTOLIN HFA) 108 (90 Base) MCG/ACT inhaler Inhale 2 puffs into the lungs every 4 (four) hours as needed for wheezing or shortness of breath.  . famotidine (PEPCID) 20 MG tablet Take 1 tablet (20 mg total) by mouth 2 (two) times daily.  . Omeprazole (PRILOSEC PO) Take 2 capsules by mouth daily.  . ondansetron (ZOFRAN) 4 MG tablet Take 1 tablet (4 mg total) by mouth every  6 (six) hours.  Marland Kitchen oxyCODONE-acetaminophen (PERCOCET/ROXICET) 5-325 MG tablet Take 1 tablet by mouth every 6 (six) hours as needed for severe pain.  Marland Kitchen ibuprofen (ADVIL) 800 MG tablet Take 1 tablet (800 mg total) by mouth every 8 (eight) hours as needed for moderate pain. For AFTER surgery only  . oxyCODONE (OXY IR/ROXICODONE) 5 MG immediate release tablet Take 1 tablet (5 mg total) by mouth every 4 (four) hours as needed for severe pain. For AFTER surgery only, do not take and drive  . senna-docusate (SENOKOT-S) 8.6-50 MG tablet Take 2 tablets by mouth at bedtime. For AFTER surgery, do not take if having diarrhea  . [DISCONTINUED] cephALEXin (KEFLEX) 500 MG capsule Take 1 capsule (500 mg total) by mouth 2 (two) times daily. (Patient not taking: Reported on  11/18/2016)  . [DISCONTINUED] Ferrous Sulfate (IRON PO) Take by mouth.  . [DISCONTINUED] levofloxacin (LEVAQUIN) 500 MG tablet Take 1 tablet (500 mg total) by mouth daily.  . [DISCONTINUED] pantoprazole (PROTONIX) 20 MG tablet Take 1 tablet (20 mg total) by mouth daily. (Patient not taking: Reported on 11/18/2016)  . [DISCONTINUED] sucralfate (CARAFATE) 1 GM/10ML suspension Take 10 mLs (1 g total) by mouth 4 (four) times daily -  with meals and at bedtime. (Patient not taking: Reported on 11/18/2016)   No facility-administered encounter medications on file as of 07/11/2019.     ALLERGIES:  No Known Allergies   FAMILY HISTORY:  Family History  Problem Relation Age of Onset  . Colon cancer Father   . Prostate cancer Father   . Colon cancer Paternal Aunt   . Ovarian cancer Paternal Grandmother   . Breast cancer Cousin   . Endometriosis Mother      SOCIAL HISTORY:  Relationships  Social connections  . Talks on phone: Not on file  . Gets together: Not on file  . Attends religious service: Not on file  . Active member of club or organization: Not on file  . Attends meetings of clubs or organizations: Not on file  . Relationship status: Not on file    REVIEW OF SYSTEMS:  Reports abdominal pain and headaches. Denies appetite changes, fevers, chills, fatigue, unexplained weight changes. Denies hearing loss, neck lumps or masses, mouth sores, ringing in ears or voice changes. Denies cough or wheezing.  Denies shortness of breath. Denies chest pain or palpitations. Denies leg swelling. Denies abdominal distention, pain, blood in stools, constipation, diarrhea, nausea, vomiting, or early satiety. Denies pain with intercourse, dysuria, frequency, hematuria or incontinence. Denies hot flashes, pelvic pain, vaginal bleeding or vaginal discharge.   Denies joint pain, back pain or muscle pain/cramps. Denies itching, rash, or wounds. Denies dizziness, headaches, numbness or seizures. Denies  swollen lymph nodes or glands, denies easy bruising or bleeding. Denies anxiety, depression, confusion, or decreased concentration.  Physical Exam:  Vital Signs for this encounter:  Blood pressure (!) 139/104, pulse (!) 101, temperature 97.6 F (36.4 C), temperature source Temporal, resp. rate 17, height 5\' 2"  (1.575 m), weight 236 lb 9.6 oz (107.3 kg), last menstrual period 06/13/2019, SpO2 100 %. Body mass index is 43.27 kg/m. General: Alert, oriented, no acute distress.  HEENT: Normocephalic, atraumatic. Sclera anicteric.  Chest: Clear to auscultation bilaterally.  Cardiovascular: Regular rate and rhythm, no murmurs, rubs, or gallops.  Abdomen: Obese. Normoactive bowel sounds. Soft, mildly distended with palpable mass extending out of the pelvis most notably in the periumbilical region spanning 4 to 5 cm above the umbilicus. Moderate tenderness to palpation. No hepatosplenomegaly appreciated.  No palpable fluid wave.  Extremities: Grossly normal range of motion. Warm, well perfused. No edema bilaterally.  Skin: No rashes or lesions.  Lymphatics: No cervical, supraclavicular, or inguinal adenopathy.  GU:  Normal external female genitalia. No lesions. No discharge or bleeding.             Bladder/urethra:  No lesions or masses, well supported bladder             Vagina: Well rugated vaginal mucosa.             Cervix: Normal appearing, no lesions.             Uterus/Adnexa: Uterus indistinct from pelvic mass, moved together, mobile.  No parametrial involvement or nodularity  Rectal: No nodularity.  LABORATORY AND RADIOLOGIC DATA:  CT A/P on 11/28: FINDINGS: Lower chest: No acute abnormality.  Hepatobiliary: Previous cholecystectomy. Hepatic steatosis. No liver masses are identified. Portal vein is patent.  Pancreas: Unremarkable. No pancreatic ductal dilatation or surrounding inflammatory changes.  Spleen: Normal in size without focal abnormality.  Adrenals/Urinary Tract:  Adrenal glands are unremarkable. Kidneys are normal, without renal calculi, focal lesion, or hydronephrosis. Bladder is unremarkable.  Stomach/Bowel: The stomach is normal in appearance. The small bowel, including the duodenum is normal in appearance. Colon is normal. The appendix is normal.  Vascular/Lymphatic: No significant vascular findings are present. No enlarged abdominal or pelvic lymph nodes.  Reproductive: There is a complex cystic mass in the superior central pelvis with multiple thickened septations. The mass measures 16 by 10.6 by 14.2 cm in craniocaudal, AP, and transverse dimensions. This mass is favored to be adnexal/ovarian rather than uterine in origin. The mass does abut the fundus of the uterus however. The complex cystic mass also abuts a more solid-appearing rounded structure in the right pelvis measuring up to 3.4 cm on series 2, image 72, perhaps the right ovary. I suspect the complex cystic mass may arise from the left adnexa/left ovary.  Other: There is a small amount of free fluid in the pelvis which is likely secondary to the large complex cystic mass with multiple septations. No free air. No adenopathy. No peritoneal or omental disease identified.  Musculoskeletal: No acute or significant osseous findings.  IMPRESSION: 1. There is a 16 cm complex cystic mass in the pelvis containing thickened septations located near midline, abutting the superior aspect of the uterus. I suspect this mass is adnexal/ovarian in origin rather than uterine in origin. Specifically, I suspect the mass likely arises from the left ovary/adnexa and is most consistent with a neoplasm. Malignancy is certainly not excluded on this study. Recommend a pelvic ultrasound for further evaluation. 2. The rounded more solid-appearing structure in the right side of the pelvis is probably the right ovary. Recommend attention on ultrasound. 3. The small amount of fluid in the pelvis  is likely reactive to the complex cystic mass likely rising from the left ovary/adnexa. 4. No cause for blood in vomit or black stools identified. No convincing evidence of a perforated or inflamed gastric or duodenal ulcer. 5. Hepatic steatosis.  Pelvic ultrasound on 11/28: FINDINGS: Uterus  Measurements: 8.2 x 4.5 x 6 cm = volume: 117.1 mL. Complex nabothian cysts are identified. A 3.4 x 3.6 x 3.1 cm fibroid is identified in the right uterine body.  Endometrium  Thickness: 6 mm.  No focal abnormality visualized.  Right ovary  Measurements: 3.3 x 3 x 2.5 cm = volume: 12.5 mL. There is a single follicle in the periphery  of the right ovary. Centrally, there is an ill-defined hypoechoic hypervascular region in the right ovary.  Left ovary  There is a large mass in the pelvis seen on recent CT imaging which measures 15.9 x 9.7 x 15 cm on this study. The mass demonstrates solid and cystic components with multiple thickened septations. There is internal blood flow. A separate left ovary is not seen.  Other findings  A small amount of fluid is seen in the pelvis.  IMPRESSION: 1. There is a large mass in the pelvis also seen on CT imaging. A separate left ovary is not visualized. 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 The mass is suspicious for malignancy. Recommend gynecologic consultation. 2. The right ovary demonstrates an irregular ill-defined hypoechoic hypervascular region centrally. The findings are nonspecific in the right ovary. However, given the apparent left ovarian neoplasm suspicious for malignancy, recommend an MRI to further assess the right ovary. 3. Uterine fibroid measuring 3.6 cm.  MRI A/P on 11/28: FINDINGS: COMBINED FINDINGS FOR BOTH MR ABDOMEN AND PELVIS  Lower chest: Unremarkable.  Hepatobiliary: Signal loss throughout the liver on opposed phase imaging compatible with hepatic steatosis.  Gallbladder surgically absent. No intrahepatic or extrahepatic biliary ductal dilatation. No focal hepatic lesion is identified.  Pancreas:  Unremarkable  Spleen:  Unremarkable  Adrenals/Urinary Tract: Normal adrenal glands. Kidneys enhance symmetrically with contrast. No hydronephrosis.  Stomach/Bowel: Visualized small and large bowel is unremarkable. No evidence for bowel obstruction.  Vascular/Lymphatic: Normal caliber abdominal aorta. No retroperitoneal lymphadenopathy.  Reproductive: Within the right aspect of the uterine body there is a 3.0 x 3.2 cm intramural fibroid. The right ovary measures 3.9 x 3.3 cm. Small amount of fluid in the pelvis.  There is a 15 x 10 x 16 cm enhancing multi septated solid and cystic mass which appears to be arising from the left ovary within the left hemipelvis extending cranially into the central abdomen. (Image 19; series 13).  Other: There are two adjacent enhancing nodules within the central upper abdomen just deep to the left hepatic lobe measuring 9 mm in the mm (image 32; series 11) concerning for carcinomatosis.  Musculoskeletal: No aggressive or acute appearing osseous lesions.  IMPRESSION: 1. 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 There are 2 enhancing nodules within the central upper abdomen raising the possibility of peritoneal carcinomatosis. Additionally, there is a small amount fluid within the pelvis. Malignant ascites not excluded. 2. Fibroid uterus. 3. Hepatic steatosis.

## 2019-07-11 NOTE — Patient Instructions (Addendum)
Preparing for your Surgery  Plan for surgery on July 21, 2019 with Dr. Jeral Pinch at [place] will be scheduled for a exploratory laparotomy (open incision), unilateral salpingo-oophorectomy, possible staging (if cancer-total hysterectomy, bilateral salpingo-oophorectomy, omentectomy, staging. If borderline-total hysterectomy, bilateral salpingo-oophorectomy).   Pre-operative Testing -You will receive a phone call from presurgical testing at [facility] if you have not received a call already to arrange for a pre-operative testing appointment before your surgery.  This appointment normally occurs one to two weeks before your scheduled surgery.   -Bring your insurance card, copy of an advanced directive if applicable, medication list  -At that visit, you will be asked to sign a consent for a possible blood transfusion in case a transfusion becomes necessary during surgery.  The need for a blood transfusion is rare but having consent is a necessary part of your care.     -You should not be taking blood thinners or aspirin at least ten days prior to surgery unless instructed by your surgeon.  -As part of our enhanced surgical recovery pathway, you may be advised to drink a carbohydrate drink the morning of surgery (at least 3 hours before). If you are diabetic, this will be substituted with G2 gatorade in order to prevent elevated glucose levels prior to surgery.  -Do not take supplements such as fish oil (omega 3), red yeast rice, tumeric before your surgery.  Day Before Surgery at [place] will be asked to take in a light diet the day before surgery.  Avoid carbonated beverages.  You will be advised to have nothing to eat or drink after midnight the evening before.    Eat a light diet the day before surgery.  Examples including soups, broths, toast, yogurt, mashed potatoes.  Things to avoid include carbonated beverages (fizzy beverages), raw fruits and raw  vegetables, or beans.   If your bowels are filled with gas, your surgeon will have difficulty visualizing your pelvic organs which increases your surgical risks.  Your role in recovery Your role is to become active as soon as directed by your doctor, while still giving yourself time to heal.  Rest when you feel tired. You will be asked to do the following in order to speed your recovery:  - Cough and breathe deeply. This helps toclear and expand your lungs and can prevent pneumonia.  - Do mild physical activity. Walking or moving your legs help your circulation and body functions return to normal. A staff member will help you when you try to walk and will provide you with simple exercises. Do not try to get up or walk alone the first time. - Actively manage your pain. Managing your pain lets you move in comfort. We will ask you to rate your pain on a scale of zero to 10. It is your responsibility to tell your doctor or nurse where and how much you hurt so your pain can be treated.  Special Considerations -If you are diabetic, you may be placed on insulin after surgery to have closer control over your blood sugars to promote healing and recovery.  This does not mean that you will be discharged on insulin.  If applicable, your oral antidiabetics will be resumed when you are tolerating a solid diet.  -Your final pathology results from surgery should be available around one week after surgery and the results will be relayed to you when available.  -Dr. Lahoma Crocker is the surgeon that assists your GYN Oncologist  with surgery.  If you end up staying the night, the next day after your surgery you will either see Dr. Denman George or Dr. Lahoma Crocker.  -FMLA forms can be faxed to [phone] and please allow 5-7 business days for completion.  Pain Management After Surgery -You have been prescribed your pain medication and bowel regimen medications before surgery so that you can have these  available when you are discharged from the hospital. The pain medication is for use ONLY AFTER surgery and a new prescription will not be given.   -Make sure that you have Tylenol and Ibuprofen at home to use on a regular basis after surgery for pain control. We recommend alternating the medications every hour to six hours since they work differently and are processed in the body differently for pain relief.  -Review the attached handout on narcotic use and their risks and side effects.   Bowel Regimen -You have been prescribed Sennakot-S to take nightly to prevent constipation especially if you are taking the narcotic pain medication intermittently.  It is important to prevent constipation and drink adequate amounts of liquids.  Blood Transfusion Information WHAT IS A BLOOD TRANSFUSION? A transfusion is the replacement of blood or some of its parts. Blood is made up of multiple cells which provide different functions.  Red blood cells carry oxygen and are used for blood loss replacement.  White blood cells fight against infection.  Platelets control bleeding.  Plasma helps clot blood.  Other blood products are available for specialized needs, such as hemophilia or other clotting disorders. BEFORE THE TRANSFUSION  Who gives blood for transfusions?   You may be able to donate blood to be used at a later date on yourself (autologous donation).  Relatives can be asked to donate blood. This is generally not any safer than if you have received blood from a stranger. The same precautions are taken to ensure safety when a relative's blood is donated.  Healthy volunteers who are fully evaluated to make sure their blood is safe. This is blood bank blood. Transfusion therapy is the safest it has ever been in the practice of medicine. Before blood is taken from a donor, a complete history is taken to make sure that person has no history of diseases nor engages in risky social behavior (examples  are intravenous drug use or sexual activity with multiple partners). The donor's travel history is screened to minimize risk of transmitting infections, such as malaria. The donated blood is tested for signs of infectious diseases, such as HIV and hepatitis. The blood is then tested to be sure it is compatible with you in order to minimize the chance of a transfusion reaction. If you or a relative donates blood, this is often done in anticipation of surgery and is not appropriate for emergency situations. It takes many days to process the donated blood. RISKS AND COMPLICATIONS Although transfusion therapy is very safe and saves many lives, the main dangers of transfusion include:   Getting an infectious disease.  Developing a transfusion reaction. This is an allergic reaction to something in the blood you were given. Every precaution is taken to prevent this. The decision to have a blood transfusion has been considered carefully by your caregiver before blood is given. Blood is not given unless the benefits outweigh the risks.  AFTER SURGERY INSTRUCTIONS  07/11/2019  Return to work: 4-6 weeks if applicable  Activity: 1. Be up and out of the bed during the day.  Take a nap  if needed.  You may walk up steps but be careful and use the hand rail.  Stair climbing will tire you more than you think, you may need to stop part way and rest.   2. No lifting or straining for 6 weeks.  3. No driving for 2 week(s).  Do not drive if you are taking narcotic pain medicine.  4. Shower daily.  Use soap and water on your incision and pat dry; don't rub.  No tub baths until cleared by your surgeon.   5. No sexual activity and nothing in the vagina for 2 weeks.  6. You may experience a small amount of clear drainage from your incision, which is normal.  If the drainage persists or increases, please call the office.  7. Take Tylenol or ibuprofen first for pain and only use narcotic pain medication for severe  pain not relieved by the Tylenol or Ibuprofen.  Monitor your Tylenol intake to a max of 4,000 mg.  Diet: 1. Low sodium Heart Healthy Diet is recommended.  2. It is safe to use a laxative, such as Miralax or Colace, if you have difficulty moving your bowels. You can take Sennakot at bedtime every evening to keep bowel movements regular and to prevent constipation.    Wound Care: 1. Keep clean and dry.  Shower daily.  Reasons to call the Doctor:  Fever - Oral temperature greater than 100.4 degrees Fahrenheit  Foul-smelling vaginal discharge  Difficulty urinating  Nausea and vomiting  Increased pain at the site of the incision that is unrelieved with pain medicine.  Difficulty breathing with or without chest pain  New calf pain especially if only on one side  Sudden, continuing increased vaginal bleeding with or without clots.   Contacts: For questions or concerns you should contact:  Dr. Jeral Pinch at [phone]  Joylene John, NP at [phone]  After Hours: call [phone] and have the GYN Oncologist paged/contacted

## 2019-07-12 LAB — CA 125: Cancer Antigen (CA) 125: 38.1 U/mL (ref 0.0–38.1)

## 2019-07-14 ENCOUNTER — Telehealth: Payer: Self-pay

## 2019-07-14 ENCOUNTER — Encounter: Payer: Self-pay | Admitting: Gynecologic Oncology

## 2019-07-14 NOTE — Telephone Encounter (Signed)
Pt states  that she will return to work on Monday 08-29-19 after surgery 07-19-20. Joylene John, NP will write a note for her pre-op testing appointment tomorrow but this cannot be included in the disability dates. A letter will be left up front for her to p/u after her pre op testing.

## 2019-07-14 NOTE — Patient Instructions (Addendum)
DUE TO COVID-19 ONLY ONE VISITOR IS ALLOWED TO COME WITH YOU AND STAY IN THE WAITING ROOM ONLY DURING PRE OP AND PROCEDURE DAY OF SURGERY. THE 1 VISITOR MAY VISIT WITH YOU AFTER SURGERY IN YOUR PRIVATE ROOM DURING VISITING HOURS ONLY!  YOU NEED TO HAVE A COVID 19 TEST ON__12/14_____ @__2 :10pm_____, THIS TEST MUST BE DONE BEFORE SURGERY, COME  [address], [place] , [address].  ([place]) ONCE YOUR COVID TEST IS COMPLETED, PLEASE BEGIN THE QUARANTINE INSTRUCTIONS AS OUTLINED IN YOUR HANDOUT.                Sarah Newman    Your procedure is scheduled on: 07/21/19   Report to [facility] Main  Entrance   Report to admitting at   9:15AM     Call this number if you have problems the morning of surgery [phone]    . BRUSH YOUR TEETH MORNING OF SURGERY AND RINSE YOUR MOUTH OUT, NO CHEWING GUM CANDY OR MINTS.  Eat a light diet the day before surgery Full Liquid Diet   Strained creamy soups Tea, Coffee- with cream or mild and sugar or honey  Juices- cranberry , grape and apple  Jello  Milkshakes  Pudding , custards  Popsicles  Water Plain ice cream f, frozen yogurt, sherbet, plain yogurt  Fruit ices and popsicles with no fruit pulp  Sugar, honey and syrups Clear broths  Boost, Ensure, Resource and other liquid supplements NO CARBONATED BEVERAGES     Do not eat food After Midnight.   YOU MAY HAVE CLEAR LIQUIDS FROM MIDNIGHT UNTIL 8:00 AM.    CLEAR LIQUID DIET   Foods Allowed                                                                     Foods Excluded  Coffee and tea, regular and decaf                             liquids that you cannot  Plain Jell-O any favor except red or purple                                           see through such as: Fruit ices (not with fruit pulp)                                     milk, soups, orange juice  Iced Popsicles                                    All solid food Carbonated beverages,  regular and diet                                    Cranberry, grape and apple juices Sports drinks like Gatorade Lightly seasoned clear broth or consume(fat free) Sugar, honey syrup  At 8:00 AM Please finish the prescribed Pre-Surgery  Drink.   Nothing by mouth after you finish the  drink !    Take these medicines the morning of surgery with A SIP OF WATER: pepcid ,prilosec                                 You may not have any metal on your body including hair pins and              piercings  Do not wear jewelry, make-up, lotions, powders or perfumes, deodorant    Do not bring valuables to the hospital. [place].  Contacts, dentures or bridgework may not be worn into surgery.       Special Instructions: N/A              Please read over the following fact sheets you were given: _____________________________________________________________________             [facility] - Preparing for Surgery  Before surgery, you can play an important role .  Because skin is not sterile, your skin needs to be as free of germs as possible.   You can reduce the number of germs on your skin by washing with CHG (chlorahexidine gluconate) soap before surgery.   CHG is an antiseptic cleaner which kills germs and bonds with the skin to continue killing germs even after washing. Please DO NOT use if you have an allergy to CHG or antibacterial soaps.   If your skin becomes reddened/irritated stop using the CHG and inform your nurse when you arrive at Short Stay. Do not shave (including legs and underarms) for at least 48 hours prior to the first CHG shower.   Please follow these instructions carefully:  1.  Shower with CHG Soap the night before surgery and the  morning of Surgery.  2.  If you choose to wash your hair, wash your hair first as usual with your  normal  shampoo.  3.  After you shampoo, rinse your hair and body thoroughly to  remove the  shampoo.                                        4.  Use CHG as you would any other liquid soap.  You can apply chg directly  to the skin and wash                       Gently with a scrungie or clean washcloth.  5.  Apply the CHG Soap to your body ONLY FROM THE NECK DOWN.   Do not use on face/ open                           Wound or open sores. Avoid contact with eyes, ears mouth and genitals (private parts).                       Wash face,  Genitals (private parts) with your normal soap.             6.  Wash thoroughly, paying special attention to the area where your surgery  will be performed.  7.  Thoroughly rinse your body with warm water from the neck down.  8.  DO NOT shower/wash with your normal soap after using and rinsing off  the CHG Soap.             9.  Pat yourself dry with a clean towel.            10.  Wear clean pajamas.            11.  Place clean sheets on your bed the night of your first shower and do not  sleep with pets. Day of Surgery : Do not apply any lotions/deodorants the morning of surgery.  Please wear clean clothes to the hospital/surgery center.  FAILURE TO FOLLOW THESE INSTRUCTIONS MAY RESULT IN THE CANCELLATION OF YOUR SURGERY PATIENT SIGNATURE_________________________________  NURSE SIGNATURE__________________________________  ________________________________________________________________________   Sarah Newman  An incentive spirometer is a tool that can help keep your lungs clear and active. This tool measures how well you are filling your lungs with each breath. Taking long deep breaths may help reverse or decrease the chance of developing breathing (pulmonary) problems (especially infection) following:  A long period of time when you are unable to move or be active. BEFORE THE PROCEDURE   If the spirometer includes an indicator to show your best effort, your nurse or respiratory therapist will set it to a desired goal.  If  possible, sit up straight or lean slightly forward. Try not to slouch.  Hold the incentive spirometer in an upright position. INSTRUCTIONS FOR USE  1. Sit on the edge of your bed if possible, or sit up as far as you can in bed or on a chair. 2. Hold the incentive spirometer in an upright position. 3. Breathe out normally. 4. Place the mouthpiece in your mouth and seal your lips tightly around it. 5. Breathe in slowly and as deeply as possible, raising the piston or the ball toward the top of the column. 6. Hold your breath for 3-5 seconds or for as long as possible. Allow the piston or ball to fall to the bottom of the column. 7. Remove the mouthpiece from your mouth and breathe out normally. 8. Rest for a few seconds and repeat Steps 1 through 7 at least 10 times every 1-2 hours when you are awake. Take your time and take a few normal breaths between deep breaths. 9. The spirometer may include an indicator to show your best effort. Use the indicator as a goal to work toward during each repetition. 10. After each set of 10 deep breaths, practice coughing to be sure your lungs are clear. If you have an incision (the cut made at the time of surgery), support your incision when coughing by placing a pillow or rolled up towels firmly against it. Once you are able to get out of bed, walk around indoors and cough well. You may stop using the incentive spirometer when instructed by your caregiver.  RISKS AND COMPLICATIONS  Take your time so you do not get dizzy or light-headed.  If you are in pain, you may need to take or ask for pain medication before doing incentive spirometry. It is harder to take a deep breath if you are having pain. AFTER USE  Rest and breathe slowly and easily.  It can be helpful to keep track of a log of your progress. Your caregiver can provide you with a simple table to help with this. If you are  using the spirometer at home, follow these instructions: [place]  IF:   You are having difficultly using the spirometer.  You have trouble using the spirometer as often as instructed.  Your pain medication is not giving enough relief while using the spirometer.  You develop fever of 100.5 F (38.1 C) or higher. SEEK IMMEDIATE MEDICAL CARE IF:   You cough up bloody sputum that had not been present before.  You develop fever of 102 F (38.9 C) or greater.  You develop worsening pain at or near the incision site. MAKE SURE YOU:   Understand these instructions.  Will watch your condition.  Will get help right away if you are not doing well or get worse. Document Released: 12/01/2006 Document Revised: 10/13/2011 Document Reviewed: 02/01/2007 ExitCare Patient Information 2014 ExitCare, [place].   ________________________________________________________________________  WHAT IS A BLOOD TRANSFUSION? Blood Transfusion Information  A transfusion is the replacement of blood or some of its parts. Blood is made up of multiple cells which provide different functions.  Red blood cells carry oxygen and are used for blood loss replacement.  White blood cells fight against infection.  Platelets control bleeding.  Plasma helps clot blood.  Other blood products are available for specialized needs, such as hemophilia or other clotting disorders. BEFORE THE TRANSFUSION  Who gives blood for transfusions?   Healthy volunteers who are fully evaluated to make sure their blood is safe. This is blood bank blood. Transfusion therapy is the safest it has ever been in the practice of medicine. Before blood is taken from a donor, a complete history is taken to make sure that person has no history of diseases nor engages in risky social behavior (examples are intravenous drug use or sexual activity with multiple partners). The donor's travel history is screened to minimize risk of transmitting infections, such as malaria. The donated blood is tested for signs of  infectious diseases, such as HIV and hepatitis. The blood is then tested to be sure it is compatible with you in order to minimize the chance of a transfusion reaction. If you or a relative donates blood, this is often done in anticipation of surgery and is not appropriate for emergency situations. It takes many days to process the donated blood. RISKS AND COMPLICATIONS Although transfusion therapy is very safe and saves many lives, the main dangers of transfusion include:   Getting an infectious disease.  Developing a transfusion reaction. This is an allergic reaction to something in the blood you were given. Every precaution is taken to prevent this. The decision to have a blood transfusion has been considered carefully by your caregiver before blood is given. Blood is not given unless the benefits outweigh the risks. AFTER THE TRANSFUSION  Right after receiving a blood transfusion, you will usually feel much better and more energetic. This is especially true if your red blood cells have gotten low (anemic). The transfusion raises the level of the red blood cells which carry oxygen, and this usually causes an energy increase.  The nurse administering the transfusion will monitor you carefully for complications. HOME CARE INSTRUCTIONS  No special instructions are needed after a transfusion. You may find your energy is better. Speak with your caregiver about any limitations on activity for underlying diseases you may have. SEEK MEDICAL CARE IF:   Your condition is not improving after your transfusion.  You develop redness or irritation at the intravenous (IV) site. SEEK IMMEDIATE MEDICAL CARE IF:  Any of the following symptoms  occur over the next 12 hours:  Shaking chills.  You have a temperature by mouth above 102 F (38.9 C), not controlled by medicine.  Chest, back, or muscle pain.  People around you feel you are not acting correctly or are confused.  Shortness of breath or  difficulty breathing.  Dizziness and fainting.  You get a rash or develop hives.  You have a decrease in urine output.  Your urine turns a dark color or changes to pink, red, or brown. Any of the following symptoms occur over the next 10 days:  You have a temperature by mouth above 102 F (38.9 C), not controlled by medicine.  Shortness of breath.  Weakness after normal activity.  The white part of the eye turns yellow (jaundice).  You have a decrease in the amount of urine or are urinating less often.  Your urine turns a dark color or changes to pink, red, or brown. Document Released: 07/18/2000 Document Revised: 10/13/2011 Document Reviewed: 03/06/2008 [facility] Patient Information 2014 [place], [place].  _______________________________________________________________________

## 2019-07-15 ENCOUNTER — Encounter (HOSPITAL_COMMUNITY): Payer: Self-pay

## 2019-07-15 ENCOUNTER — Other Ambulatory Visit: Payer: Self-pay

## 2019-07-15 ENCOUNTER — Encounter (HOSPITAL_COMMUNITY)
Admission: RE | Admit: 2019-07-15 | Discharge: 2019-07-15 | Disposition: A | Discharge status: Home / Self Care | Payer: BC Managed Care – PPO | Source: Ambulatory Visit | Attending: Gynecologic Oncology | Admitting: Gynecologic Oncology

## 2019-07-15 DIAGNOSIS — R1900 Intra-abdominal and pelvic swelling, mass and lump, unspecified site: Secondary | ICD-10-CM

## 2019-07-15 DIAGNOSIS — Z01812 Encounter for preprocedural laboratory examination: Secondary | ICD-10-CM | POA: Insufficient documentation

## 2019-07-15 DIAGNOSIS — N839 Noninflammatory disorder of ovary, fallopian tube and broad ligament, unspecified: Secondary | ICD-10-CM | POA: Insufficient documentation

## 2019-07-15 LAB — CBC
HCT: 37.8 % (ref 36.0–46.0)
Hemoglobin: 12.2 g/dL (ref 12.0–15.0)
MCH: 28.9 pg (ref 26.0–34.0)
MCHC: 32.3 g/dL (ref 30.0–36.0)
MCV: 89.6 fL (ref 80.0–100.0)
Platelets: 425 10*3/uL — ABNORMAL HIGH (ref 150–400)
RBC: 4.22 MIL/uL (ref 3.87–5.11)
RDW: 12.6 % (ref 11.5–15.5)
WBC: 13.7 10*3/uL — ABNORMAL HIGH (ref 4.0–10.5)
nRBC: 0 % (ref 0.0–0.2)

## 2019-07-15 LAB — COMPREHENSIVE METABOLIC PANEL
ALT: 77 U/L — ABNORMAL HIGH (ref 0–44)
AST: 93 U/L — ABNORMAL HIGH (ref 15–41)
Albumin: 4 g/dL (ref 3.5–5.0)
Alkaline Phosphatase: 107 U/L (ref 38–126)
Anion gap: 8 (ref 5–15)
BUN: 8 mg/dL (ref 6–20)
CO2: 23 mmol/L (ref 22–32)
Calcium: 8.9 mg/dL (ref 8.9–10.3)
Chloride: 102 mmol/L (ref 98–111)
Creatinine, Ser: 0.72 mg/dL (ref 0.44–1.00)
GFR calc Af Amer: >60 mL/min (ref >60)
GFR calc non Af Amer: >60 mL/min (ref >60)
Glucose, Bld: 118 mg/dL — ABNORMAL HIGH (ref 70–99)
Potassium: 3.8 mmol/L (ref 3.5–5.1)
Sodium: 133 mmol/L — ABNORMAL LOW (ref 135–145)
Total Bilirubin: 1.1 mg/dL (ref 0.3–1.2)
Total Protein: 8.1 g/dL (ref 6.5–8.1)

## 2019-07-15 LAB — URINALYSIS, ROUTINE W REFLEX MICROSCOPIC
Bacteria, UA: NONE SEEN
Bilirubin Urine: NEGATIVE
Glucose, UA: NEGATIVE mg/dL
Hgb urine dipstick: NEGATIVE
Ketones, ur: NEGATIVE mg/dL
Nitrite: NEGATIVE
Protein, ur: 30 mg/dL — AB
Specific Gravity, Urine: 1.024 (ref 1.005–1.030)
Squamous Epithelial / HPF: >50 — ABNORMAL HIGH (ref 0–5)
pH: 5 (ref 5.0–8.0)

## 2019-07-15 LAB — ABO/RH: ABO/RH(D): O POS

## 2019-07-15 MED ORDER — ENSURE PRE-SURGERY PO LIQD
296.0000 mL | Freq: Once | ORAL | Status: DC
  Filled 2019-07-15: qty 296

## 2019-07-15 NOTE — Progress Notes (Signed)
PCP - . Dr. Delfin Gant  In [facility] Cardiologist - none  Chest x-ray - no EKG - no Stress Test - no ECHO - no Cardiac Cath - no  Sleep Study - no CPAP - no  Fasting Blood Sugar - NA Checks Blood Sugar _____ times a day  Blood Thinner Instructions:NA Aspirin Instructions: Last Dose:  Anesthesia review:   Patient denies shortness of breath, fever, cough and chest pain at PAT appointment yes  Patient verbalized understanding of instructions that were given to them at the PAT appointment. Patient was also instructed that they will need to review over the PAT instructions again at home before surgery. yes

## 2019-07-15 NOTE — Telephone Encounter (Signed)
Pt called stating she has pain and it is different than before.  The pain is in the lower left abdomen.  I recommended that she take ibuprofen every 6 hours as needed.  I also told her she can go to the ED.  She has her pre op appointment today at [facility].  She verbalized understanding.

## 2019-07-16 ENCOUNTER — Other Ambulatory Visit: Payer: Self-pay | Admitting: Gynecologic Oncology

## 2019-07-16 DIAGNOSIS — Z01818 Encounter for other preprocedural examination: Secondary | ICD-10-CM

## 2019-07-18 ENCOUNTER — Other Ambulatory Visit (HOSPITAL_COMMUNITY)
Admission: RE | Admit: 2019-07-18 | Discharge: 2019-07-18 | Disposition: A | Discharge status: Home / Self Care | Payer: BC Managed Care – PPO | Source: Ambulatory Visit | Attending: Gynecologic Oncology | Admitting: Gynecologic Oncology

## 2019-07-18 ENCOUNTER — Ambulatory Visit: Payer: BC Managed Care – PPO

## 2019-07-18 DIAGNOSIS — Z20828 Contact with and (suspected) exposure to other viral communicable diseases: Secondary | ICD-10-CM | POA: Insufficient documentation

## 2019-07-18 DIAGNOSIS — Z01812 Encounter for preprocedural laboratory examination: Secondary | ICD-10-CM | POA: Insufficient documentation

## 2019-07-19 ENCOUNTER — Telehealth: Payer: Self-pay

## 2019-07-19 LAB — NOVEL CORONAVIRUS, NAA (HOSP ORDER, SEND-OUT TO REF LAB; TAT 18-24 HRS): SARS-CoV-2, NAA: NOT DETECTED

## 2019-07-19 LAB — URINE CULTURE

## 2019-07-19 NOTE — Telephone Encounter (Signed)
A copy of patient's FMLA papers for surgery 07-21-19 e-mailed to patient at her request. E-mail [email or web address]

## 2019-07-20 ENCOUNTER — Telehealth: Payer: Self-pay

## 2019-07-20 NOTE — Telephone Encounter (Signed)
I spoke to patient she has no questions regarding surgery tomorrow.  I told her she is covid negative.

## 2019-07-21 ENCOUNTER — Encounter: Admit: 2019-07-21 | Discharge: 2021-08-26 | Attending: Hematology & Oncology | Primary: Hematology & Oncology

## 2019-07-21 ENCOUNTER — Encounter (HOSPITAL_COMMUNITY): Payer: Self-pay | Admitting: Gynecologic Oncology

## 2019-07-21 ENCOUNTER — Inpatient Hospital Stay (HOSPITAL_COMMUNITY): Payer: BC Managed Care – PPO | Admitting: Anesthesiology

## 2019-07-21 ENCOUNTER — Encounter (HOSPITAL_COMMUNITY)
Admission: RE | Disposition: A | Discharge status: Home / Self Care | Payer: Self-pay | Source: Home / Self Care | Attending: Gynecologic Oncology

## 2019-07-21 ENCOUNTER — Inpatient Hospital Stay (HOSPITAL_COMMUNITY): Payer: BC Managed Care – PPO | Admitting: Physician Assistant

## 2019-07-21 ENCOUNTER — Other Ambulatory Visit: Payer: Self-pay

## 2019-07-21 ENCOUNTER — Inpatient Hospital Stay (HOSPITAL_COMMUNITY)
Admission: RE | Admit: 2019-07-21 | Discharge: 2019-07-25 | DRG: 737 | Disposition: A | Discharge status: Home / Self Care | Payer: BC Managed Care – PPO | Attending: Gynecologic Oncology | Admitting: Gynecologic Oncology

## 2019-07-21 DIAGNOSIS — D251 Intramural leiomyoma of uterus: Secondary | ICD-10-CM | POA: Diagnosis present

## 2019-07-21 DIAGNOSIS — C562 Malignant neoplasm of left ovary: Secondary | ICD-10-CM | POA: Diagnosis present

## 2019-07-21 DIAGNOSIS — Z8041 Family history of malignant neoplasm of ovary: Secondary | ICD-10-CM

## 2019-07-21 DIAGNOSIS — N888 Other specified noninflammatory disorders of cervix uteri: Secondary | ICD-10-CM | POA: Diagnosis present

## 2019-07-21 DIAGNOSIS — R188 Other ascites: Secondary | ICD-10-CM | POA: Diagnosis present

## 2019-07-21 DIAGNOSIS — K567 Ileus, unspecified: Secondary | ICD-10-CM | POA: Diagnosis not present

## 2019-07-21 DIAGNOSIS — J45909 Unspecified asthma, uncomplicated: Secondary | ICD-10-CM | POA: Diagnosis present

## 2019-07-21 DIAGNOSIS — R350 Frequency of micturition: Secondary | ICD-10-CM | POA: Diagnosis present

## 2019-07-21 DIAGNOSIS — K760 Fatty (change of) liver, not elsewhere classified: Secondary | ICD-10-CM | POA: Diagnosis present

## 2019-07-21 DIAGNOSIS — D62 Acute posthemorrhagic anemia: Secondary | ICD-10-CM | POA: Diagnosis not present

## 2019-07-21 DIAGNOSIS — Z9049 Acquired absence of other specified parts of digestive tract: Secondary | ICD-10-CM | POA: Diagnosis not present

## 2019-07-21 DIAGNOSIS — Z6841 Body Mass Index (BMI) 40.0 and over, adult: Secondary | ICD-10-CM | POA: Diagnosis not present

## 2019-07-21 DIAGNOSIS — R1900 Intra-abdominal and pelvic swelling, mass and lump, unspecified site: Secondary | ICD-10-CM | POA: Diagnosis present

## 2019-07-21 DIAGNOSIS — R6881 Early satiety: Secondary | ICD-10-CM | POA: Diagnosis present

## 2019-07-21 DIAGNOSIS — Z8711 Personal history of peptic ulcer disease: Secondary | ICD-10-CM | POA: Diagnosis not present

## 2019-07-21 DIAGNOSIS — K5790 Diverticulosis of intestine, part unspecified, without perforation or abscess without bleeding: Secondary | ICD-10-CM | POA: Diagnosis present

## 2019-07-21 HISTORY — PX: SALPINGOOPHORECTOMY: SHX82

## 2019-07-21 LAB — TYPE AND SCREEN
ABO/RH(D): O POS
Antibody Screen: NEGATIVE

## 2019-07-21 LAB — PREGNANCY, URINE: Preg Test, Ur: NEGATIVE

## 2019-07-21 SURGERY — SALPINGO-OOPHORECTOMY, OPEN
Anesthesia: General

## 2019-07-21 MED ORDER — FENTANYL CITRATE (PF) 100 MCG/2ML IJ SOLN
INTRAMUSCULAR | Status: AC
  Filled 2019-07-21: qty 4

## 2019-07-21 MED ORDER — ALBUTEROL SULFATE (2.5 MG/3ML) 0.083% IN NEBU: 3.0000 mL | INHALATION_SOLUTION | RESPIRATORY_TRACT | Status: DC | PRN

## 2019-07-21 MED ORDER — SUGAMMADEX SODIUM 200 MG/2ML IV SOLN
INTRAVENOUS | Status: DC | PRN
  Administered 2019-07-21: 220 mg via INTRAVENOUS

## 2019-07-21 MED ORDER — LIDOCAINE 2% (20 MG/ML) 5 ML SYRINGE
INTRAMUSCULAR | Status: DC | PRN
  Administered 2019-07-21: 1.5 mg/kg/h via INTRAVENOUS

## 2019-07-21 MED ORDER — METRONIDAZOLE IN NACL 5-0.79 MG/ML-% IV SOLN
500.0000 mg | Freq: Once | INTRAVENOUS | Status: AC
  Administered 2019-07-21: 500 mg via INTRAVENOUS
  Filled 2019-07-21: qty 100

## 2019-07-21 MED ORDER — LABETALOL HCL 5 MG/ML IV SOLN
INTRAVENOUS | Status: DC | PRN
  Administered 2019-07-21 (×3): 5 mg via INTRAVENOUS

## 2019-07-21 MED ORDER — PROPOFOL 10 MG/ML IV BOLUS
INTRAVENOUS | Status: AC
  Filled 2019-07-21: qty 20

## 2019-07-21 MED ORDER — KCL IN DEXTROSE-NACL 20-5-0.45 MEQ/L-%-% IV SOLN
INTRAVENOUS | Status: DC
  Filled 2019-07-21 (×6): qty 1000

## 2019-07-21 MED ORDER — FENTANYL CITRATE (PF) 250 MCG/5ML IJ SOLN
INTRAMUSCULAR | Status: AC
  Filled 2019-07-21: qty 5

## 2019-07-21 MED ORDER — KETOROLAC TROMETHAMINE 15 MG/ML IJ SOLN
15.0000 mg | Freq: Four times a day (QID) | INTRAMUSCULAR | Status: AC
  Administered 2019-07-21 – 2019-07-22 (×4): 15 mg via INTRAVENOUS
  Filled 2019-07-21 (×4): qty 1

## 2019-07-21 MED ORDER — ENSURE ENLIVE PO LIQD
237.0000 mL | Freq: Two times a day (BID) | ORAL | Status: DC
  Administered 2019-07-25 (×2): 237 mL via ORAL

## 2019-07-21 MED ORDER — FENTANYL CITRATE (PF) 100 MCG/2ML IJ SOLN
INTRAMUSCULAR | Status: AC
  Filled 2019-07-21: qty 2

## 2019-07-21 MED ORDER — NON FORMULARY: 1.0000 [IU] | Freq: Three times a day (TID) | Status: DC

## 2019-07-21 MED ORDER — DEXAMETHASONE SODIUM PHOSPHATE 10 MG/ML IJ SOLN
INTRAMUSCULAR | Status: DC | PRN
  Administered 2019-07-21: 10 mg via INTRAVENOUS

## 2019-07-21 MED ORDER — MIDAZOLAM HCL 2 MG/2ML IJ SOLN
INTRAMUSCULAR | Status: AC
  Filled 2019-07-21: qty 2

## 2019-07-21 MED ORDER — LABETALOL HCL 5 MG/ML IV SOLN
INTRAVENOUS | Status: AC
  Filled 2019-07-21: qty 4

## 2019-07-21 MED ORDER — ROCURONIUM BROMIDE 10 MG/ML (PF) SYRINGE
PREFILLED_SYRINGE | INTRAVENOUS | Status: AC
  Filled 2019-07-21: qty 10

## 2019-07-21 MED ORDER — FENTANYL CITRATE (PF) 100 MCG/2ML IJ SOLN
25.0000 ug | INTRAMUSCULAR | Status: DC | PRN
  Administered 2019-07-21 (×3): 50 ug via INTRAVENOUS

## 2019-07-21 MED ORDER — LACTATED RINGERS IV SOLN: INTRAVENOUS | Status: DC

## 2019-07-21 MED ORDER — BUPIVACAINE LIPOSOME 1.3 % IJ SUSP
20.0000 mL | Freq: Once | INTRAMUSCULAR | Status: AC
  Administered 2019-07-21: 20 mL
  Filled 2019-07-21: qty 20

## 2019-07-21 MED ORDER — DEXAMETHASONE SODIUM PHOSPHATE 10 MG/ML IJ SOLN
INTRAMUSCULAR | Status: AC
  Filled 2019-07-21: qty 1

## 2019-07-21 MED ORDER — SENNOSIDES-DOCUSATE SODIUM 8.6-50 MG PO TABS
2.0000 | ORAL_TABLET | Freq: Every day | ORAL | Status: DC
  Administered 2019-07-22 – 2019-07-24 (×3): 2 via ORAL
  Filled 2019-07-21 (×3): qty 2

## 2019-07-21 MED ORDER — TRAMADOL HCL 50 MG PO TABS
100.0000 mg | ORAL_TABLET | Freq: Four times a day (QID) | ORAL | Status: DC | PRN
  Administered 2019-07-22: 100 mg via ORAL
  Filled 2019-07-21: qty 2

## 2019-07-21 MED ORDER — LIDOCAINE 2% (20 MG/ML) 5 ML SYRINGE
INTRAMUSCULAR | Status: DC | PRN
  Administered 2019-07-21: 50 mg via INTRAVENOUS

## 2019-07-21 MED ORDER — SODIUM CHLORIDE (PF) 0.9 % IJ SOLN
INTRAMUSCULAR | Status: AC
  Filled 2019-07-21: qty 20

## 2019-07-21 MED ORDER — PANTOPRAZOLE SODIUM 40 MG PO TBEC
80.0000 mg | DELAYED_RELEASE_TABLET | Freq: Every day | ORAL | Status: DC
  Administered 2019-07-22 – 2019-07-25 (×4): 80 mg via ORAL
  Filled 2019-07-21 (×4): qty 2

## 2019-07-21 MED ORDER — CHLORHEXIDINE GLUCONATE CLOTH 2 % EX PADS
6.0000 | MEDICATED_PAD | Freq: Every day | CUTANEOUS | Status: DC
  Administered 2019-07-22: 6 via TOPICAL

## 2019-07-21 MED ORDER — PANTOPRAZOLE SODIUM 40 MG IV SOLR
40.0000 mg | Freq: Every day | INTRAVENOUS | Status: AC
  Administered 2019-07-21: 40 mg via INTRAVENOUS
  Filled 2019-07-21: qty 40

## 2019-07-21 MED ORDER — FENTANYL CITRATE (PF) 250 MCG/5ML IJ SOLN
INTRAMUSCULAR | Status: DC | PRN
  Administered 2019-07-21 (×2): 50 ug via INTRAVENOUS
  Administered 2019-07-21: 150 ug via INTRAVENOUS
  Administered 2019-07-21: 25 ug via INTRAVENOUS
  Administered 2019-07-21: 100 ug via INTRAVENOUS
  Administered 2019-07-21: 25 ug via INTRAVENOUS
  Administered 2019-07-21: 50 ug via INTRAVENOUS

## 2019-07-21 MED ORDER — CEFAZOLIN SODIUM-DEXTROSE 2-4 GM/100ML-% IV SOLN
2.0000 g | INTRAVENOUS | Status: AC
  Administered 2019-07-21: 2 g via INTRAVENOUS
  Filled 2019-07-21: qty 100

## 2019-07-21 MED ORDER — GABAPENTIN 300 MG PO CAPS
300.0000 mg | ORAL_CAPSULE | ORAL | Status: AC
  Administered 2019-07-21: 300 mg via ORAL
  Filled 2019-07-21: qty 1

## 2019-07-21 MED ORDER — METRONIDAZOLE IN NACL 5-0.79 MG/ML-% IV SOLN
INTRAVENOUS | Status: AC
  Filled 2019-07-21: qty 100

## 2019-07-21 MED ORDER — LACTATED RINGERS IV SOLN: INTRAVENOUS | Status: DC | PRN

## 2019-07-21 MED ORDER — ONDANSETRON HCL 4 MG/2ML IJ SOLN
INTRAMUSCULAR | Status: DC | PRN
  Administered 2019-07-21: 4 mg via INTRAVENOUS

## 2019-07-21 MED ORDER — ENOXAPARIN (LOVENOX) PATIENT EDUCATION KIT
PACK | Freq: Once | Status: AC
  Filled 2019-07-21: qty 1

## 2019-07-21 MED ORDER — ONDANSETRON HCL 4 MG PO TABS: 4.0000 mg | ORAL_TABLET | Freq: Four times a day (QID) | ORAL | Status: DC | PRN

## 2019-07-21 MED ORDER — FAMOTIDINE 20 MG PO TABS
20.0000 mg | ORAL_TABLET | Freq: Two times a day (BID) | ORAL | Status: DC
  Administered 2019-07-22 – 2019-07-25 (×7): 20 mg via ORAL
  Filled 2019-07-21 (×7): qty 1

## 2019-07-21 MED ORDER — ACETAMINOPHEN 500 MG PO TABS
1000.0000 mg | ORAL_TABLET | Freq: Four times a day (QID) | ORAL | Status: DC
  Administered 2019-07-22 – 2019-07-24 (×8): 1000 mg via ORAL
  Filled 2019-07-21 (×10): qty 2

## 2019-07-21 MED ORDER — KETAMINE HCL 10 MG/ML IJ SOLN
INTRAMUSCULAR | Status: AC
  Filled 2019-07-21: qty 1

## 2019-07-21 MED ORDER — PROPOFOL 10 MG/ML IV BOLUS
INTRAVENOUS | Status: DC | PRN
  Administered 2019-07-21: 200 mg via INTRAVENOUS

## 2019-07-21 MED ORDER — HEPARIN SODIUM (PORCINE) 5000 UNIT/ML IJ SOLN
5000.0000 [IU] | INTRAMUSCULAR | Status: AC
  Administered 2019-07-21: 5000 [IU] via SUBCUTANEOUS
  Filled 2019-07-21: qty 1

## 2019-07-21 MED ORDER — BUPIVACAINE HCL 0.25 % IJ SOLN
INTRAMUSCULAR | Status: DC | PRN
  Administered 2019-07-21: 20 mL

## 2019-07-21 MED ORDER — SCOPOLAMINE 1 MG/3DAYS TD PT72
1.0000 | MEDICATED_PATCH | TRANSDERMAL | Status: DC
  Administered 2019-07-21: 1.5 mg via TRANSDERMAL
  Filled 2019-07-21: qty 1

## 2019-07-21 MED ORDER — SODIUM CHLORIDE (PF) 0.9 % IJ SOLN
INTRAMUSCULAR | Status: DC | PRN
  Administered 2019-07-21: 20 mL

## 2019-07-21 MED ORDER — DEXAMETHASONE SODIUM PHOSPHATE 4 MG/ML IJ SOLN: 4.0000 mg | INTRAMUSCULAR | Status: DC

## 2019-07-21 MED ORDER — OXYCODONE HCL 5 MG/5ML PO SOLN: 5.0000 mg | Freq: Once | ORAL | Status: DC | PRN

## 2019-07-21 MED ORDER — ROCURONIUM BROMIDE 10 MG/ML (PF) SYRINGE
PREFILLED_SYRINGE | INTRAVENOUS | Status: DC | PRN
  Administered 2019-07-21 (×3): 10 mg via INTRAVENOUS
  Administered 2019-07-21: 60 mg via INTRAVENOUS
  Administered 2019-07-21: 10 mg via INTRAVENOUS

## 2019-07-21 MED ORDER — KETAMINE HCL 10 MG/ML IJ SOLN
INTRAMUSCULAR | Status: DC | PRN
  Administered 2019-07-21: 50 mg via INTRAVENOUS

## 2019-07-21 MED ORDER — ENOXAPARIN SODIUM 40 MG/0.4ML ~~LOC~~ SOLN
40.0000 mg | SUBCUTANEOUS | Status: DC
  Administered 2019-07-22 – 2019-07-25 (×4): 40 mg via SUBCUTANEOUS
  Filled 2019-07-21 (×5): qty 0.4

## 2019-07-21 MED ORDER — LIDOCAINE 2% (20 MG/ML) 5 ML SYRINGE
INTRAMUSCULAR | Status: AC
  Filled 2019-07-21: qty 5

## 2019-07-21 MED ORDER — HYDROMORPHONE HCL 1 MG/ML IJ SOLN
0.5000 mg | INTRAMUSCULAR | Status: DC | PRN
  Administered 2019-07-21 – 2019-07-24 (×6): 0.5 mg via INTRAVENOUS
  Filled 2019-07-21 (×6): qty 0.5

## 2019-07-21 MED ORDER — 0.9 % SODIUM CHLORIDE (POUR BTL) OPTIME
TOPICAL | Status: DC | PRN
  Administered 2019-07-21: 2000 mL

## 2019-07-21 MED ORDER — ACETAMINOPHEN 500 MG PO TABS
1000.0000 mg | ORAL_TABLET | ORAL | Status: AC
  Administered 2019-07-21: 1000 mg via ORAL
  Filled 2019-07-21: qty 2

## 2019-07-21 MED ORDER — ONDANSETRON HCL 4 MG/2ML IJ SOLN: 4.0000 mg | Freq: Four times a day (QID) | INTRAMUSCULAR | Status: DC | PRN

## 2019-07-21 MED ORDER — SUGAMMADEX SODIUM 500 MG/5ML IV SOLN
INTRAVENOUS | Status: AC
  Filled 2019-07-21: qty 5

## 2019-07-21 MED ORDER — CELECOXIB 200 MG PO CAPS
400.0000 mg | ORAL_CAPSULE | ORAL | Status: AC
  Administered 2019-07-21: 400 mg via ORAL
  Filled 2019-07-21: qty 2

## 2019-07-21 MED ORDER — PREGABALIN 75 MG PO CAPS
75.0000 mg | ORAL_CAPSULE | Freq: Two times a day (BID) | ORAL | Status: DC
  Administered 2019-07-22 – 2019-07-25 (×7): 75 mg via ORAL
  Filled 2019-07-21 (×7): qty 1

## 2019-07-21 MED ORDER — OXYCODONE HCL 5 MG PO TABS: 5.0000 mg | ORAL_TABLET | Freq: Once | ORAL | Status: DC | PRN

## 2019-07-21 MED ORDER — OXYCODONE HCL 5 MG PO TABS
5.0000 mg | ORAL_TABLET | ORAL | Status: DC | PRN
  Administered 2019-07-22 – 2019-07-23 (×4): 5 mg via ORAL
  Filled 2019-07-21 (×4): qty 1

## 2019-07-21 MED ORDER — CHEWING GUM (ORBIT) SUGAR FREE
1.0000 | CHEWING_GUM | Freq: Three times a day (TID) | ORAL | Status: DC
  Administered 2019-07-22 – 2019-07-25 (×11): 1 via ORAL
  Filled 2019-07-21: qty 1

## 2019-07-21 MED ORDER — LIDOCAINE HCL 2 % IJ SOLN
INTRAMUSCULAR | Status: AC
  Filled 2019-07-21: qty 20

## 2019-07-21 MED ORDER — MIDAZOLAM HCL 2 MG/2ML IJ SOLN
INTRAMUSCULAR | Status: DC | PRN
  Administered 2019-07-21: 2 mg via INTRAVENOUS

## 2019-07-21 MED ORDER — ONDANSETRON HCL 4 MG/2ML IJ SOLN
INTRAMUSCULAR | Status: AC
  Filled 2019-07-21: qty 2

## 2019-07-21 SURGICAL SUPPLY — 62 items
ATTRACTOMAT 16X20 MAGNETIC DRP (DRAPES) ×1 IMPLANT
BLADE EXTENDED COATED 6.5IN (ELECTRODE) ×2 IMPLANT
CELLS DAT CNTRL 66122 CELL SVR (MISCELLANEOUS) IMPLANT
CHLORAPREP W/TINT 26 (MISCELLANEOUS) ×2 IMPLANT
CLIP VESOCCLUDE LG 6/CT (CLIP) ×1 IMPLANT
CLIP VESOCCLUDE MED 6/CT (CLIP) ×1 IMPLANT
CLIP VESOCCLUDE MED LG 6/CT (CLIP) ×1 IMPLANT
CONT SPEC 4OZ CLIKSEAL STRL BL (MISCELLANEOUS) ×1 IMPLANT
COVER WAND RF STERILE (DRAPES) IMPLANT
DERMABOND ADVANCED (GAUZE/BANDAGES/DRESSINGS) ×1
DERMABOND ADVANCED .7 DNX12 (GAUZE/BANDAGES/DRESSINGS) IMPLANT
DRAPE SURG IRRIG POUCH 19X23 (DRAPES) ×2 IMPLANT
DRAPE WARM FLUID 44X44 (DRAPES) ×2 IMPLANT
DRSG OPSITE POSTOP 4X10 (GAUZE/BANDAGES/DRESSINGS) ×1 IMPLANT
DRSG OPSITE POSTOP 4X6 (GAUZE/BANDAGES/DRESSINGS) IMPLANT
DRSG OPSITE POSTOP 4X8 (GAUZE/BANDAGES/DRESSINGS) IMPLANT
ELECT REM PT RETURN 15FT ADLT (MISCELLANEOUS) ×2 IMPLANT
GAUZE 4X4 16PLY RFD (DISPOSABLE) ×1 IMPLANT
GLOVE BIO SURGEON STRL SZ 6 (GLOVE) ×4 IMPLANT
GLOVE BIO SURGEON STRL SZ 6.5 (GLOVE) ×4 IMPLANT
GOWN STRL REUS W/ TWL LRG LVL3 (GOWN DISPOSABLE) ×2 IMPLANT
GOWN STRL REUS W/TWL LRG LVL3 (GOWN DISPOSABLE) ×2
HEMOSTAT ARISTA ABSORB 3G PWDR (HEMOSTASIS) IMPLANT
HOLDER FOLEY CATH W/STRAP (MISCELLANEOUS) ×1 IMPLANT
KIT BASIN OR (CUSTOM PROCEDURE TRAY) ×2 IMPLANT
KIT TURNOVER KIT A (KITS) ×1 IMPLANT
LIGASURE IMPACT 36 18CM CVD LR (INSTRUMENTS) ×1 IMPLANT
LOOP VESSEL MAXI BLUE (MISCELLANEOUS) ×1 IMPLANT
NEEDLE HYPO 22GX1.5 SAFETY (NEEDLE) ×4 IMPLANT
NS IRRIG 1000ML POUR BTL (IV SOLUTION) ×3 IMPLANT
PACK GENERAL/GYN (CUSTOM PROCEDURE TRAY) ×2 IMPLANT
PENCIL SMOKE EVACUATOR (MISCELLANEOUS) ×1 IMPLANT
RETRACTOR WND ALEXIS 18 MED (MISCELLANEOUS) IMPLANT
RETRACTOR WND ALEXIS 25 LRG (MISCELLANEOUS) IMPLANT
RTRCTR WOUND ALEXIS 18CM MED (MISCELLANEOUS)
RTRCTR WOUND ALEXIS 25CM LRG (MISCELLANEOUS)
SCRUB CHG 4% DYNA-HEX 4OZ (MISCELLANEOUS) ×1 IMPLANT
SHEET LAVH (DRAPES) ×2 IMPLANT
SLEEVE SUCTION CATH 165 (SLEEVE) ×2 IMPLANT
SOL PREP POV-IOD 4OZ 10% (MISCELLANEOUS) ×1 IMPLANT
SPONGE LAP 18X18 RF (DISPOSABLE) ×2 IMPLANT
SURGIFLO W/THROMBIN 8M KIT (HEMOSTASIS) IMPLANT
SUT MNCRL AB 4-0 PS2 18 (SUTURE) ×4 IMPLANT
SUT PDS AB 1 TP1 96 (SUTURE) ×4 IMPLANT
SUT VIC AB 0 CT1 27 (SUTURE) ×5
SUT VIC AB 0 CT1 27XBRD ANTBC (SUTURE) IMPLANT
SUT VIC AB 2-0 CT1 27 (SUTURE) ×4
SUT VIC AB 2-0 CT1 36 (SUTURE) ×6 IMPLANT
SUT VIC AB 2-0 CT1 TAPERPNT 27 (SUTURE) ×2 IMPLANT
SUT VIC AB 2-0 CT2 27 (SUTURE) ×13 IMPLANT
SUT VIC AB 2-0 SH 27 (SUTURE) ×1
SUT VIC AB 2-0 SH 27X BRD (SUTURE) IMPLANT
SUT VIC AB 3-0 CTX 36 (SUTURE) IMPLANT
SUT VIC AB 3-0 SH 18 (SUTURE) IMPLANT
SUT VIC AB 3-0 SH 27 (SUTURE)
SUT VIC AB 3-0 SH 27X BRD (SUTURE) ×1 IMPLANT
SYR 30ML LL (SYRINGE) ×4 IMPLANT
TOWEL OR 17X26 10 PK STRL BLUE (TOWEL DISPOSABLE) ×2 IMPLANT
TOWEL OR NON WOVEN STRL DISP B (DISPOSABLE) ×2 IMPLANT
TRAY FOLEY MTR SLVR 14FR STAT (SET/KITS/TRAYS/PACK) ×1 IMPLANT
TRAY FOLEY MTR SLVR 16FR STAT (SET/KITS/TRAYS/PACK) ×1 IMPLANT
UNDERPAD 30X36 HEAVY ABSORB (UNDERPADS AND DIAPERS) ×2 IMPLANT

## 2019-07-21 NOTE — Transfer of Care (Signed)
Immediate Anesthesia Transfer of Care Note  Patient: Sarah Newman  Procedure(s) Performed: EXPLORATORY LAPAROTOMY, TOTAL ABDOMINAL HYSTERECTOMY, BILATERAL SALPINGO OOPHORECTOMY, OMENTECTOMY (N/A )  Patient Location: PACU  Anesthesia Type:General  Level of Consciousness: drowsy  Airway & Oxygen Therapy: Patient Spontanous Breathing and Patient connected to face mask oxygen  Post-op Assessment: Report given to RN and Post -op Vital signs reviewed and stable  Post vital signs: Reviewed and stable  Last Vitals:  Vitals Value Taken Time  BP 153/105 07/21/19 1254  Temp    Pulse 86 07/21/19 1255  Resp 25 07/21/19 1255  SpO2 98 % 07/21/19 1255  Vitals shown include unvalidated device data.  Last Pain:  Vitals:   07/21/19 0859  TempSrc:   PainSc: 7       Patients Stated Pain Goal: 6 ([id number])  Complications: No apparent anesthesia complications

## 2019-07-21 NOTE — Interval H&P Note (Signed)
History and Physical Interval Note:  07/21/2019 8:28 AM  Sarah Newman  has presented today for surgery, with the diagnosis of ovarian mass.  The various methods of treatment have been discussed with the patient and family. After consideration of risks, benefits and other options for treatment, the patient has consented to  Procedure(s): OPEN UNILATERAL SALPINGO OOPHORECTOMY, POSSIBLE STAGING (N/A) as a surgical intervention.  The patient's history has been reviewed, patient examined, no change in status, stable for surgery.  I have reviewed the patient's chart and labs.  Questions were answered to the patient's satisfaction.     Lafonda Mosses

## 2019-07-21 NOTE — Op Note (Signed)
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  Surgeon: Jeral Pinch, MD  Assistant Surgeon: Lahoma Crocker, M.D. (an MD assistant was necessary for tissue manipulation, management of robotic instrumentation, retraction and positioning due to the complexity of the case and hospital policies).   Specimens: Uterus cervix, bilateral tubes / ovaries, washings and omentum.   Estimated Blood Loss: 375 mL.    Urine Output: 0000000 cc  Complications: None apparent.   Operative Findings: On EUA, mobile uterus and ovarian mass, situated out of the pelvis, spanning 6-8 cm above the umbilicus.  On intra-abdominal entry, inflamed omentum adherent to much of the 16 cm left ovarian mass, adhesions easily lysed bluntly.  No nodularity of the ovarian mass however on delivery of the mass through the midline incision, there was Intra-Op rupture of one of the smaller cystic components with drainage of clear brown-tinged fluid.  Grossly normal-appearing left fallopian tube as well as right tube.  Right ovary mildly enlarged measuring approximately 4 cm and firm/nodular, suspicious for tumor involvement.  Uterus 8-10 cm with 4 cm anterior mid body fibroid.  Some very small volume miliary disease along right pelvic sidewall, removed with uterine specimen.  Evidence of small diverticular disease.  Small bowel normal appearing although multiple loops of bowel adherent to each other, especially by mesentery, and an inflammatory manner.  An approximately 2 x 2 centimeter nodule suspicious for tumor implant was found in the omentum just inferior to the greater curvature of the stomach.  Additional small (less than 5 mm) implants noted  on the stomach and posterior aspect of the left lobe of the liver.  Otherwise the liver surface and diaphragm were smooth.  Frozen pathology was consistent with ovarian high-grade carcinoma.  Given suspected stage III disease with no lymphadenopathy noted during surgery and normal appearance of lymph nodes on preoperative imaging, lymph node sampling was deferred.  Procedure:   The patient was seen in the Holding Room. The risks, benefits, complications, treatment options, and expected outcomes were discussed with the patient.  The patient concurred with the proposed plan, giving informed consent.   The patient was  identified as Sarah Newman  and the procedure verified as USO with possible staging, including hysterectomy and contralateral USO. A Time Out was held and the above information confirmed upon entry to the operating room.  After induction of anesthesia, the patient was prepped and draped in the normal sterile fashion in the dorsal lithotomy position in padded Allen stirrups with good attention paid to support of the lower back and lower extremities. Position was adjusted for appropriate support. A Foley catheter was placed to gravity.   A midline vertical incision was made and carried through the subcutaneous tissue to the fascia. The fascial incision was made and extended superiorally. The rectus muscles were separated. The peritoneum was identified and entered sharply. Peritoneal incision was extended longitudinally with monopolar electrocautery.    The abdomen was explored with findings as noted above.  Small volume ascites was noted on entry.  This was collected and sent to pathology.  Blunt dissection was used to free the ovarian mass from the overlying omentum.  Once freed, we attempted to deliver the mass through the incision; however, a small cystic area of the mass ruptured during this maneuver with drainage of brown clear fluid.  Ultimately the ovary was delivered through the  incision.  Kelly clamps were used to doubly clamp the infundibulopelvic ligament just inferior to the base of the ovary.  The IP ligament was then cut and the mass was handed off the field to be sent for frozen pathology.  The IP ligament was doubly suture ligated with 2-0 Vicryl.  The remainder of the abdomen was then explored.  Once frozen pathology revealed high-grade carcinoma, decision was made to pursue tumor debulking.  A Bookwalter retractor was then placed.  Kelly clamps were placed on both uterine cornua. After packing the small bowel into the upper abdomen, the right round ligament was transected with monopolar electrocautery and the incision extended superiorly paralleling the IP ligament. The pararectal space was developed and the retroperitoneum developed up to the level of the common iliac artery.  The course of the ureter was identified. The  IP was then skeletonized, cauterized with bipolar electrocautery using the LigaSure device, and cut.  The anterior peritoneal reflection was incised and the bladder was dissected off the lower uterine segment.  The same procedure was then performed on the left.  The uterine vessels were skeletonized bilaterally , then clamped, cut and suture ligated with 2-Vicryl suture. Serial pedicles of the cardinal and utero-sacral ligaments were clamped, cut, and suture ligated with 2-Vicryl. Entrance was made into the vagina and the cervix amputated. The vaginal cuff angle were suture ligated with 0-Vicryl suture. placed incorporating the utero-sacral ligaments for support. The remainder of the vaginal cuff was then closed with figure-of-eight sutures using 0 Vicryl.  The pelvis was copiously irrigated. Hemostasis was observed.  Attention was then turned to the omentectomy.  On repeat palpation, a nodule just inferior to the greater curvature of the stomach was noted.  An infragastric omentectomy was performed using a Ligasure, first dissecting the omentum from the  transverse colon and entering the lesser sac.  There was some obliteration of the lesser sac noted.  Given the nodule and its proximity to the greater curvature of the stomach, several of the short gastric vessels were cauterized, transected, and suture ligated.  For this reason, an NG tube was inserted to be left in place for 24 hours postop to help with decompression of the stomach.  Hemostasis was assured.  The abdomen and pelvis were irrigated again with good hemostasis noted. The fascia was reapproximated with 0 looped PDS using a total of two sutures. The subcutaneous layer was then irrigated copiously.  Exparel was injected for local anesthesia.  The subcutaneous layer was reapproximated with interrupted 2-0 Vicryl. The subcutaneous layer was then reapproximated with a running 4-0 Monocryl. The patient tolerated the procedure well.   Sponge, lap and needle counts were correct x 2.  The patient was taken to the PACU in stable condition.  Jeral Pinch MD Gynecologic Oncology

## 2019-07-21 NOTE — Anesthesia Procedure Notes (Signed)
Procedure Name: Intubation Date/Time: 07/21/2019 9:36 AM Performed by: Sharlette Dense, CRNA Patient Re-evaluated:Patient Re-evaluated prior to induction Oxygen Delivery Method: Circle system utilized Preoxygenation: Pre-oxygenation with 100% oxygen Induction Type: IV induction Ventilation: Mask ventilation without difficulty and Oral airway inserted - appropriate to patient size Laryngoscope Size: Sabra Heck and 2 Grade View: Grade I Tube type: Oral Tube size: 7.5 mm Number of attempts: 1 Airway Equipment and Method: Stylet Placement Confirmation: ETT inserted through vocal cords under direct vision,  positive ETCO2 and breath sounds checked- equal and bilateral Secured at: 21 cm Tube secured with: Tape Dental Injury: Teeth and Oropharynx as per pre-operative assessment

## 2019-07-21 NOTE — Brief Op Note (Signed)
07/21/2019  12:40 PM  PATIENT:  Sarah Newman  43 y.o. 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  PROCEDURE:  Procedure(s): EXPLORATORY LAPAROTOMY, TOTAL ABDOMINAL HYSTERECTOMY, BILATERAL SALPINGO OOPHORECTOMY, OMENTECTOMY (N/A)  SURGEON:  Surgeon(s) and Role:    Lafonda Mosses, MD - Primary    * Lahoma Crocker, MD - Assisting   ANESTHESIA:   general  EBL:  375 mL   BLOOD ADMINISTERED:none  DRAINS: none   LOCAL MEDICATIONS USED:  Exparel, bupivicaine  SPECIMEN:  Left ovary (sent for frozen), uterus, cervix, bilateral tubes, right ovary, omentum, ascites  DISPOSITION OF SPECIMEN:  PATHOLOGY  COUNTS:  YES  TOURNIQUET:  * No tourniquets in log *  DICTATION: .Note written in EPIC  PLAN OF CARE: Admit to inpatient   PATIENT DISPOSITION:  PACU - hemodynamically stable.   Delay start of Pharmacological VTE agent (>24hrs) due to surgical blood loss or risk of bleeding: no, plan to check CBC in am and as long as stable, will start VTE chemoprophylaxis

## 2019-07-21 NOTE — Anesthesia Preprocedure Evaluation (Signed)
Anesthesia Evaluation  Patient identified by MRN, date of birth, ID band Patient awake    Reviewed: Allergy & Precautions, H&P , NPO status , Patient's Chart, lab work & pertinent test results  Airway Mallampati: II   Neck ROM: full    Dental   Pulmonary asthma ,    breath sounds clear to auscultation       Cardiovascular negative cardio ROS   Rhythm:regular Rate:Normal     Neuro/Psych    GI/Hepatic PUD,   Endo/Other  Morbid obesity  Renal/GU      Musculoskeletal   Abdominal   Peds  Hematology  (+) Blood dyscrasia, anemia ,   Anesthesia Other Findings   Reproductive/Obstetrics                             Anesthesia Physical Anesthesia Plan  ASA: II  Anesthesia Plan: General   Post-op Pain Management:    Induction: Intravenous  PONV Risk Score and Plan: 3 and Ondansetron, Dexamethasone, Midazolam, Treatment may vary due to age or medical condition and Scopolamine patch - Pre-op  Airway Management Planned: Oral ETT  Additional Equipment:   Intra-op Plan:   Post-operative Plan: Extubation in OR  Informed Consent: I have reviewed the patients History and Physical, chart, labs and discussed the procedure including the risks, benefits and alternatives for the proposed anesthesia with the patient or authorized representative who has indicated his/her understanding and acceptance.       Plan Discussed with: CRNA, Anesthesiologist and Surgeon  Anesthesia Plan Comments:         Anesthesia Quick Evaluation

## 2019-07-22 ENCOUNTER — Encounter: Payer: Self-pay | Admitting: *Deleted

## 2019-07-22 ENCOUNTER — Other Ambulatory Visit: Payer: Self-pay | Admitting: Gynecologic Oncology

## 2019-07-22 DIAGNOSIS — C563 Malignant neoplasm of bilateral ovaries: Secondary | ICD-10-CM

## 2019-07-22 DIAGNOSIS — C561 Malignant neoplasm of right ovary: Secondary | ICD-10-CM

## 2019-07-22 LAB — BASIC METABOLIC PANEL
Anion gap: 9 (ref 5–15)
BUN: 6 mg/dL (ref 6–20)
CO2: 25 mmol/L (ref 22–32)
Calcium: 8.3 mg/dL — ABNORMAL LOW (ref 8.9–10.3)
Chloride: 103 mmol/L (ref 98–111)
Creatinine, Ser: 0.6 mg/dL (ref 0.44–1.00)
GFR calc Af Amer: >60 mL/min (ref >60)
GFR calc non Af Amer: >60 mL/min (ref >60)
Glucose, Bld: 200 mg/dL — ABNORMAL HIGH (ref 70–99)
Potassium: 4.5 mmol/L (ref 3.5–5.1)
Sodium: 137 mmol/L (ref 135–145)

## 2019-07-22 LAB — CBC
HCT: 32.1 % — ABNORMAL LOW (ref 36.0–46.0)
Hemoglobin: 10.2 g/dL — ABNORMAL LOW (ref 12.0–15.0)
MCH: 28.7 pg (ref 26.0–34.0)
MCHC: 31.8 g/dL (ref 30.0–36.0)
MCV: 90.2 fL (ref 80.0–100.0)
Platelets: 508 10*3/uL — ABNORMAL HIGH (ref 150–400)
RBC: 3.56 MIL/uL — ABNORMAL LOW (ref 3.87–5.11)
RDW: 12.3 % (ref 11.5–15.5)
WBC: 15.4 10*3/uL — ABNORMAL HIGH (ref 4.0–10.5)
nRBC: 0 % (ref 0.0–0.2)

## 2019-07-22 LAB — URINE CULTURE

## 2019-07-22 LAB — SURGICAL PATHOLOGY

## 2019-07-22 LAB — GLUCOSE, CAPILLARY: Glucose-Capillary: 120 mg/dL — ABNORMAL HIGH (ref 70–99)

## 2019-07-22 MED ORDER — PHENOL 1.4 % MT LIQD
1.0000 | OROMUCOSAL | Status: DC | PRN
  Filled 2019-07-22: qty 177

## 2019-07-22 MED ORDER — ENOXAPARIN SODIUM 40 MG/0.4ML ~~LOC~~ SOLN: 40.0000 mg | SUBCUTANEOUS | 0 refills | Status: DC

## 2019-07-22 MED ORDER — OXYCODONE HCL 5 MG PO TABS: 5.0000 mg | ORAL_TABLET | ORAL | 0 refills | Status: DC | PRN

## 2019-07-22 NOTE — Treatment Plan (Signed)
Final pathology resulted today, with the exception of ascites fluid.  Both ovaries involved by clear cell carcinoma as well as small omental nodule.  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  We discussed her stage, clear cell carcinoma diagnosis, and reviewed expected treatment course.  I placed a genetics referral today.  Patient understands that she will start chemotherapy, likely approximately 3 weeks postop, with a platinum doublet.  Reviewed general overall survival and progression free survival data for stage III clear cell carcinoma.  All the patient's questions answered.  Jeral Pinch MD Gynecologic Oncology

## 2019-07-22 NOTE — Progress Notes (Signed)
1 Day Post-Op Procedure(s) (LRB): EXPLORATORY LAPAROTOMY, TOTAL ABDOMINAL HYSTERECTOMY, BILATERAL SALPINGO OOPHORECTOMY, OMENTECTOMY (N/A)  Subjective: Patient reports overall doing well.  She had increased pain last night, but this has improved some.  She feels that Toradol has been helpful.  She reports some throat soreness secondary to her NG tube.  She denies any nausea.  She denies any flatus or bowel movement but has felt some rumbling.  She denies burping.  She ambulated without difficulty last night.  Her Foley catheter was removed this morning, she has yet to void.  Objective: Vital signs in last 24 hours: Temp:  [97.9 F (36.6 C)-98.9 F (37.2 C)] 97.9 F (36.6 C) (12/18 0543) Pulse Rate:  [59-93] 59 (12/18 0543) Resp:  [15-20] 20 (12/18 0543) BP: (115-172)/(87-129) 148/87 (12/18 0543) SpO2:  [92 %-100 %] 100 % (12/18 0543) Last BM Date: 07/20/19  Intake/Output from previous day: 12/17 0701 - 12/18 0700 In: 4542.5 [I.V.:4342.5; IV Piggyback:200] Out: 2275 [Urine:1700; Emesis/NG output:200; Blood:375]  Physical Examination: General: No acute distress HEENT: Atraumatic, no scleral icterus Pulmonary: Lungs clear to auscultation bilaterally, no wheezes rhonchi or rales Cardiac: Regular rate and rhythm, no murmurs rubs or gallops Abdomen: Soft, appropriately tender, some fullness in the midline just above the incision with increased tenderness upon palpation.  Hypoactive bowel sounds exudate Extremities: Moving freely, no edema or tenderness with palpation  Labs: CBC    Component Value Date/Time   WBC 15.4 (H) 07/22/2019 0359   RBC 3.56 (L) 07/22/2019 0359   HGB 10.2 (L) 07/22/2019 0359   HCT 32.1 (L) 07/22/2019 0359   PLT 508 (H) 07/22/2019 0359   MCV 90.2 07/22/2019 0359   MCH 28.7 07/22/2019 0359   MCHC 31.8 07/22/2019 0359   RDW 12.3 07/22/2019 0359   LYMPHSABS 1.5 07/02/2019 1206   MONOABS 0.4 07/02/2019 1206   EOSABS 0.2 07/02/2019 1206   BASOSABS 0.0  07/02/2019 1206   CMP Latest Ref Rng & Units 07/22/2019 07/15/2019 07/02/2019  Glucose 70 - 99 mg/dL 200(H) 118(H) 131(H)  BUN 6 - 20 mg/dL 6 8 12   Creatinine 0.44 - 1.00 mg/dL 0.60 0.72 0.67  Sodium 135 - 145 mmol/L 137 133(L) 139  Potassium 3.5 - 5.1 mmol/L 4.5 3.8 3.5  Chloride 98 - 111 mmol/L 103 102 100  CO2 22 - 32 mmol/L 25 23 27   Calcium 8.9 - 10.3 mg/dL 8.3(L) 8.9 8.5(L)  Total Protein 6.5 - 8.1 g/dL - 8.1 7.5  Total Bilirubin 0.3 - 1.2 mg/dL - 1.1 0.5  Alkaline Phos 38 - 126 U/L - 107 98  AST 15 - 41 U/L - 93(H) 76(H)  ALT 0 - 44 U/L - 77(H) 73(H)    Assessment:  43 y.o. s/p Procedure(s): EXPLORATORY LAPAROTOMY, TOTAL ABDOMINAL HYSTERECTOMY, BILATERAL SALPINGO OOPHORECTOMY, OMENTECTOMY: Patient doing well overall, meeting early postoperative milestones.  Pain:  Pain well controlled on current regimen.  We will switch to oral regimen after NG tube was removed.  Discussed use of an abdominal binder.  Heme: Mild anemia secondary to surgical blood loss, expected.  No signs and symptoms of anemia.  We will continue to monitor.  FEN/GI: NG tube left in overnight secondary to infra gastric omentectomy and proximity to the stomach and transection of multiple short gastric vessels.  Will DC this morning and advance diet as tolerated.  We will continue IV fluids until patient tolerating adequate p.o. intake.  Given history of peptic ulcer disease, patient was put on IV prophylaxis.  We will transition to  her home H2 and PPI blocker.  GU: Foley catheter discontinued this morning, awaiting void.  Prophylaxis: Chemoprophylaxis with Lovenox.  Plan will be to send patient home with 4 weeks of prophylactic Lovenox in the setting of open surgery and cancer diagnosis.  Plan: Advance diet, encourage ambulation.  Discussed in detail again frozen pathology and expected next steps in terms of final pathology results and treatment. Dispo: Anticipate discharge tomorrow versus Sunday pending  meeting postoperative milestones. The patient is to be discharged to home.   LOS: 1 day    Lafonda Mosses 07/22/2019, 11:36 AM

## 2019-07-22 NOTE — Anesthesia Postprocedure Evaluation (Signed)
Anesthesia Post Note  Patient: Sarah Newman  Procedure(s) Performed: EXPLORATORY LAPAROTOMY, TOTAL ABDOMINAL HYSTERECTOMY, BILATERAL SALPINGO OOPHORECTOMY, OMENTECTOMY (N/A )     Patient location during evaluation: PACU Anesthesia Type: General Level of consciousness: awake and alert Pain management: pain level controlled Vital Signs Assessment: post-procedure vital signs reviewed and stable Respiratory status: spontaneous breathing, nonlabored ventilation, respiratory function stable and patient connected to nasal cannula oxygen Cardiovascular status: blood pressure returned to baseline and stable Postop Assessment: no apparent nausea or vomiting Anesthetic complications: no    Last Vitals:  Vitals:   07/22/19 0113 07/22/19 0543  BP: (!) 145/95 (!) 148/87  Pulse: 75 (!) 59  Resp: 16 20  Temp:  36.6 C  SpO2: 98% 100%    Last Pain:  Vitals:   07/22/19 0543  TempSrc: Oral  PainSc:                  [place]

## 2019-07-22 NOTE — Plan of Care (Signed)
  Problem: Clinical Measurements: Goal: Will remain free from infection 07/22/2019 1702 by Buena Irish, RN Outcome: Progressing 07/22/2019 1702 by Buena Irish, RN Outcome: Progressing   Problem: Education: Goal: Knowledge of the prescribed therapeutic regimen will improve Outcome: Progressing Goal: Understanding of sexual limitations or changes related to disease process or condition will improve Outcome: Progressing Goal: Individualized Educational Video(s) Outcome: Progressing   Problem: Self-Concept: Goal: Communication of feelings regarding changes in body function or appearance will improve Outcome: Progressing   Problem: Skin Integrity: Goal: Demonstration of wound healing without infection will improve Outcome: Progressing

## 2019-07-22 NOTE — Discharge Instructions (Signed)
07/21/2019  Return to work: 6 weeks  Activity: 1. Be up and out of the bed during the day.  Take a nap if needed.  You may walk up steps but be careful and use the hand rail.  Stair climbing will tire you more than you think, you may need to stop part way and rest.   2. No lifting or straining for 6 weeks.  3. No driving for 1-2 weeks.  Do Not drive if you are taking narcotic pain medicine. Do not drive until you feel that you can brake safely.  4. Shower daily.  Use soap and water on your incision and pat dry; don't rub.   5. No sexual activity and nothing in the vagina for 8 weeks.  Medications:  - Take ibuprofen and tylenol first line for pain control. Take these regularly (every 6 hours) to decrease the build up of pain.  - If necessary, for severe pain not relieved by ibuprofen, take oxycodone.  - While taking oxycodone you should take sennakot every night to reduce the likelihood of constipation. If this causes diarrhea, stop its use.  Diet: 1. Low sodium Heart Healthy Diet is recommended.  2. It is safe to use a laxative if you have difficulty moving your bowels.   Wound Care: 1. Keep clean and dry.  Shower daily.  Reasons to call the Doctor:   Fever - Oral temperature greater than 100.4 degrees Fahrenheit  Foul-smelling vaginal discharge  Difficulty urinating  Nausea and vomiting  Increased pain at the site of the incision that is unrelieved with pain medicine.  Difficulty breathing with or without chest pain  New calf pain especially if only on one side  Sudden, continuing increased vaginal bleeding with or without clots.   Follow-up: 1. See Jeral Pinch in 3 weeks. 2. Phone call with Dr. Berline Lopes in 1 week for review pathology.  Contacts: For questions or concerns you should contact:  Dr. Jeral Pinch at [phone] After hours and on week-ends call [phone] and ask to speak to the physician on call for Gynecologic Oncology  Enoxaparin  injection What is this medicine? ENOXAPARIN (ee nox a PA rin) is used after knee, hip, or abdominal surgeries to prevent blood clotting. It is also used to treat existing blood clots in the lungs or in the veins. This medicine may be used for other purposes; ask your health care provider or pharmacist if you have questions. COMMON BRAND NAME(S): Lovenox What should I tell my health care provider before I take this medicine? They need to know if you have any of these conditions:  bleeding disorders, hemorrhage, or hemophilia  infection of the heart or heart valves  kidney or liver disease  previous stroke  prosthetic heart valve  recent surgery or delivery of a baby  ulcer in the stomach or intestine, diverticulitis, or other bowel disease  an unusual or allergic reaction to enoxaparin, heparin, pork or pork products, other medicines, foods, dyes, or preservatives  pregnant or trying to get pregnant  breast-feeding How should I use this medicine? This medicine is for injection under the skin. It is usually given by a health-care professional. You or a family member may be trained on how to give the injections. If you are to give yourself injections, make sure you understand how to use the syringe, measure the dose if necessary, and give the injection. To avoid bruising, do not rub the site where this medicine has been injected. Do not take  your medicine more often than directed. Do not stop taking except on the advice of your doctor or health care professional. Make sure you receive a puncture-resistant container to dispose of the needles and syringes once you have finished with them. Do not reuse these items. Return the container to your doctor or health care professional for proper disposal. Talk to your pediatrician regarding the use of this medicine in children. Special care may be needed. Overdosage: If you think you have taken too much of this medicine contact a poison control  center or emergency room at once. NOTE: This medicine is only for you. Do not share this medicine with others. What if I miss a dose? If you miss a dose, take it as soon as you can. If it is almost time for your next dose, take only that dose. Do not take double or extra doses. What may interact with this medicine?  aspirin and aspirin-like medicines  certain medicines that treat or prevent blood clots  dipyridamole  NSAIDs, medicines for pain and inflammation, like ibuprofen or naproxen This list may not describe all possible interactions. Give your health care provider a list of all the medicines, herbs, non-prescription drugs, or dietary supplements you use. Also tell them if you smoke, drink alcohol, or use illegal drugs. Some items may interact with your medicine. What should I watch for while using this medicine? Visit your healthcare professional for regular checks on your progress. You may need blood work done while you are taking this medicine. Your condition will be monitored carefully while you are receiving this medicine. It is important not to miss any appointments. If you are going to need surgery or other procedure, tell your healthcare professional that you are using this medicine. Using this medicine for a long time may weaken your bones and increase the risk of bone fractures. Avoid sports and activities that might cause injury while you are using this medicine. Severe falls or injuries can cause unseen bleeding. Be careful when using sharp tools or knives. Consider using an Copy. Take special care brushing or flossing your teeth. Report any injuries, bruising, or red spots on the skin to your healthcare professional. Wear a medical ID bracelet or chain. Carry a card that describes your disease and details of your medicine and dosage times. What side effects may I notice from receiving this medicine? Side effects that you should report to your doctor or health care  professional as soon as possible:  allergic reactions like skin rash, itching or hives, swelling of the face, lips, or tongue  bone pain  signs and symptoms of bleeding such as bloody or black, tarry stools; red or dark-brown urine; spitting up blood or brown material that looks like coffee grounds; red spots on the skin; unusual bruising or bleeding from the eye, gums, or nose  signs and symptoms of a blood clot such as chest pain; shortness of breath; pain, swelling, or warmth in the leg  signs and symptoms of a stroke such as changes in vision; confusion; trouble speaking or understanding; severe headaches; sudden numbness or weakness of the face, arm or leg; trouble walking; dizziness; loss of coordination Side effects that usually do not require medical attention (report to your doctor or health care professional if they continue or are bothersome):  hair loss  pain, redness, or irritation at site where injected This list may not describe all possible side effects. Call your doctor for medical advice about side effects. You may  report side effects to FDA at 1-800-FDA-1088. Where should I keep my medicine? Keep out of the reach of children. Store at room temperature between 15 and 30 degrees C (59 and 86 degrees F). Do not freeze. If your injections have been specially prepared, you may need to store them in the refrigerator. Ask your pharmacist. Throw away any unused medicine after the expiration date. NOTE: This sheet is a summary. It may not cover all possible information. If you have questions about this medicine, talk to your doctor, pharmacist, or health care provider.  2020 Elsevier/Gold Standard (2017-07-16 11:25:34)

## 2019-07-23 LAB — CBC
HCT: 30.8 % — ABNORMAL LOW (ref 36.0–46.0)
Hemoglobin: 9.5 g/dL — ABNORMAL LOW (ref 12.0–15.0)
MCH: 28.1 pg (ref 26.0–34.0)
MCHC: 30.8 g/dL (ref 30.0–36.0)
MCV: 91.1 fL (ref 80.0–100.0)
Platelets: 478 10*3/uL — ABNORMAL HIGH (ref 150–400)
RBC: 3.38 MIL/uL — ABNORMAL LOW (ref 3.87–5.11)
RDW: 12.3 % (ref 11.5–15.5)
WBC: 9.8 10*3/uL (ref 4.0–10.5)
nRBC: 0 % (ref 0.0–0.2)

## 2019-07-23 LAB — BASIC METABOLIC PANEL
Anion gap: 7 (ref 5–15)
BUN: 6 mg/dL (ref 6–20)
CO2: 28 mmol/L (ref 22–32)
Calcium: 8.4 mg/dL — ABNORMAL LOW (ref 8.9–10.3)
Chloride: 104 mmol/L (ref 98–111)
Creatinine, Ser: 0.77 mg/dL (ref 0.44–1.00)
GFR calc Af Amer: >60 mL/min (ref >60)
GFR calc non Af Amer: >60 mL/min (ref >60)
Glucose, Bld: 142 mg/dL — ABNORMAL HIGH (ref 70–99)
Potassium: 4 mmol/L (ref 3.5–5.1)
Sodium: 139 mmol/L (ref 135–145)

## 2019-07-23 MED ORDER — BISACODYL 10 MG RE SUPP
10.0000 mg | Freq: Every day | RECTAL | Status: DC | PRN
  Administered 2019-07-24: 10 mg via RECTAL
  Filled 2019-07-23: qty 1

## 2019-07-23 NOTE — Progress Notes (Signed)
.  Patient ID: Sarah Newman, female   DOB: 09/18/1975, 43 y.o.   MRN: [id number] 2 Days Post-Op Procedure(s) (LRB): EXPLORATORY LAPAROTOMY, TOTAL ABDOMINAL HYSTERECTOMY, BILATERAL SALPINGO OOPHORECTOMY, OMENTECTOMY (N/A)  Subjective: Patient reports early satiety; -N/V/flatus/BM.    Objective: Vital signs in last 24 hours: Temp:  [98.1 F (36.7 C)-98.3 F (36.8 C)] 98.2 F (36.8 C) (12/19 0548) Pulse Rate:  [76-87] 76 (12/19 0548) Resp:  [16-17] 16 (12/19 0548) BP: (122-149)/(90-96) 129/96 (12/19 0548) SpO2:  [95 %-97 %] 97 % (12/19 0548) Last BM Date: 07/20/19  Intake/Output from previous day: 12/18 0701 - 12/19 0700 In: 3720 [P.O.:720; I.V.:3000] Out: [id number] [Urine:1302; Emesis/NG output:50]  Physical Examination: General: alert Resp: clear to auscultation bilaterally Cardio: regular rate and rhythm, S1, S2 normal, no murmur, click, rub or gallop GI: softly distended, NT Extremities: extremities normal, atraumatic, no cyanosis or edema and Homans sign is negative, no sign of DVT Vaginal Bleeding: none I: intact under dressing Labs: WBC/Hgb/Hct/Plts:  9.8/9.5/30.8/478 (12/19 0451) BUN/Cr/glu/ALT/AST/amyl/lip:  6/0.77/--/--/--/--/-- (12/19 0451)   Assessment:  43 y.o. s/p Procedure(s): EXPLORATORY LAPAROTOMY, TOTAL ABDOMINAL HYSTERECTOMY, BILATERAL SALPINGO OOPHORECTOMY, OMENTECTOMY: tolerating diet  Pain:  Pain is well-controlled on oral medications.  Heme: Anemia stable.  GI:  Tolerating po: Yes   FEN: Electrolytes in range  Prophylaxis: pharmacologic prophylaxis (with any of the following: Lovenox) and intermittent pneumatic compression boots.  Plan: Encourage ambulation Discontinue IV fluids Dulcolax supp pr    LOS: 2 days    Lahoma Crocker 07/23/2019, 10:03 AM

## 2019-07-24 LAB — CBC WITH DIFFERENTIAL/PLATELET
Abs Immature Granulocytes: 0.05 10*3/uL (ref 0.00–0.07)
Basophils Absolute: 0 10*3/uL (ref 0.0–0.1)
Basophils Relative: 0 %
Eosinophils Absolute: 0.2 10*3/uL (ref 0.0–0.5)
Eosinophils Relative: 3 %
HCT: 31.1 % — ABNORMAL LOW (ref 36.0–46.0)
Hemoglobin: 9.8 g/dL — ABNORMAL LOW (ref 12.0–15.0)
Immature Granulocytes: 1 %
Lymphocytes Relative: 18 %
Lymphs Abs: 1.4 10*3/uL (ref 0.7–4.0)
MCH: 28.2 pg (ref 26.0–34.0)
MCHC: 31.5 g/dL (ref 30.0–36.0)
MCV: 89.6 fL (ref 80.0–100.0)
Monocytes Absolute: 0.5 10*3/uL (ref 0.1–1.0)
Monocytes Relative: 6 %
Neutro Abs: 5.9 10*3/uL (ref 1.7–7.7)
Neutrophils Relative %: 72 %
Platelets: 492 10*3/uL — ABNORMAL HIGH (ref 150–400)
RBC: 3.47 MIL/uL — ABNORMAL LOW (ref 3.87–5.11)
RDW: 12.2 % (ref 11.5–15.5)
WBC: 8.1 10*3/uL (ref 4.0–10.5)
nRBC: 0 % (ref 0.0–0.2)

## 2019-07-24 LAB — COMPREHENSIVE METABOLIC PANEL
ALT: 56 U/L — ABNORMAL HIGH (ref 0–44)
AST: 77 U/L — ABNORMAL HIGH (ref 15–41)
Albumin: 3 g/dL — ABNORMAL LOW (ref 3.5–5.0)
Alkaline Phosphatase: 90 U/L (ref 38–126)
Anion gap: 8 (ref 5–15)
BUN: 6 mg/dL (ref 6–20)
CO2: 28 mmol/L (ref 22–32)
Calcium: 8.7 mg/dL — ABNORMAL LOW (ref 8.9–10.3)
Chloride: 103 mmol/L (ref 98–111)
Creatinine, Ser: 0.7 mg/dL (ref 0.44–1.00)
GFR calc Af Amer: >60 mL/min (ref >60)
GFR calc non Af Amer: >60 mL/min (ref >60)
Glucose, Bld: 112 mg/dL — ABNORMAL HIGH (ref 70–99)
Potassium: 3.8 mmol/L (ref 3.5–5.1)
Sodium: 139 mmol/L (ref 135–145)
Total Bilirubin: 0.6 mg/dL (ref 0.3–1.2)
Total Protein: 6.7 g/dL (ref 6.5–8.1)

## 2019-07-24 LAB — MAGNESIUM: Magnesium: 2.1 mg/dL (ref 1.7–2.4)

## 2019-07-24 MED ORDER — SIMETHICONE 80 MG PO CHEW
80.0000 mg | CHEWABLE_TABLET | Freq: Three times a day (TID) | ORAL | Status: DC | PRN
  Administered 2019-07-24: 80 mg via ORAL
  Filled 2019-07-24: qty 1

## 2019-07-24 MED ORDER — POTASSIUM CHLORIDE 2 MEQ/ML IV SOLN: INTRAVENOUS | Status: DC

## 2019-07-24 MED ORDER — KETOROLAC TROMETHAMINE 15 MG/ML IJ SOLN
15.0000 mg | Freq: Four times a day (QID) | INTRAMUSCULAR | Status: AC
  Administered 2019-07-24 – 2019-07-25 (×4): 15 mg via INTRAVENOUS
  Filled 2019-07-24 (×4): qty 1

## 2019-07-24 MED ORDER — KCL IN DEXTROSE-NACL 20-5-0.45 MEQ/L-%-% IV SOLN
INTRAVENOUS | Status: DC
  Filled 2019-07-24 (×4): qty 1000

## 2019-07-24 MED ORDER — ACETAMINOPHEN 325 MG PO TABS
650.0000 mg | ORAL_TABLET | Freq: Four times a day (QID) | ORAL | Status: DC
  Administered 2019-07-24 – 2019-07-25 (×5): 650 mg via ORAL
  Filled 2019-07-24 (×6): qty 2

## 2019-07-24 NOTE — Progress Notes (Signed)
.  Patient ID: Sarah Newman, female   DOB: 07-27-1976, 43 y.o.   MRN: [id number] 3 Days Post-Op Procedure(s) (LRB): EXPLORATORY LAPAROTOMY, TOTAL ABDOMINAL HYSTERECTOMY, BILATERAL SALPINGO OOPHORECTOMY, OMENTECTOMY (N/A)  Subjective: Patient reports cramping w/N/V.  -flatus/BM.    Objective: Vital signs in last 24 hours: Temp:  [97.9 F (36.6 C)-99.5 F (37.5 C)] 98.1 F (36.7 C) (12/20 0642) Pulse Rate:  [72-89] 89 (12/20 0642) Resp:  [14-18] 18 (12/20 0642) BP: (127-152)/(89-100) 136/96 (12/20 0642) SpO2:  [94 %-99 %] 94 % (12/20 0642) Last BM Date: 07/20/19  Intake/Output from previous day: 12/19 0701 - 12/20 0700 In: 1220 [P.O.:720; I.V.:500] Out: 1050 [Urine:1050]  Physical Examination: General: alert Resp: clear to auscultation bilaterally Cardio: regular rate and rhythm, S1, S2 normal, no murmur, click, rub or gallop GI: softly distended, NT Extremities: extremities normal, atraumatic, no cyanosis or edema and Homans sign is negative, no sign of DVT Vaginal Bleeding: none I: intact under dressing Labs:       Assessment:  43 y.o. s/p Procedure(s): EXPLORATORY LAPAROTOMY, TOTAL ABDOMINAL HYSTERECTOMY, BILATERAL SALPINGO OOPHORECTOMY, OMENTECTOMY: not tolerating diet and ileus present  Pain:  Pain is not well-controlled on oral medications.  GI:  Postop ileus.  Tolerating po: No   Prophylaxis: pharmacologic prophylaxis (with any of the following: Lovenox) and intermittent pneumatic compression boots.  Plan: Encourage ambulation Clear liquids Supportive care, check electrolytes, resume IV fluids    LOS: 3 days    Lahoma Crocker 07/24/2019, 10:46 AM

## 2019-07-25 LAB — CBC WITH DIFFERENTIAL/PLATELET
Abs Immature Granulocytes: 0.07 10*3/uL (ref 0.00–0.07)
Basophils Absolute: 0 10*3/uL (ref 0.0–0.1)
Basophils Relative: 0 %
Eosinophils Absolute: 0.3 10*3/uL (ref 0.0–0.5)
Eosinophils Relative: 4 %
HCT: 28.6 % — ABNORMAL LOW (ref 36.0–46.0)
Hemoglobin: 9.1 g/dL — ABNORMAL LOW (ref 12.0–15.0)
Immature Granulocytes: 1 %
Lymphocytes Relative: 20 %
Lymphs Abs: 1.5 10*3/uL (ref 0.7–4.0)
MCH: 28.3 pg (ref 26.0–34.0)
MCHC: 31.8 g/dL (ref 30.0–36.0)
MCV: 88.8 fL (ref 80.0–100.0)
Monocytes Absolute: 0.4 10*3/uL (ref 0.1–1.0)
Monocytes Relative: 6 %
Neutro Abs: 5.1 10*3/uL (ref 1.7–7.7)
Neutrophils Relative %: 69 %
Platelets: 451 10*3/uL — ABNORMAL HIGH (ref 150–400)
RBC: 3.22 MIL/uL — ABNORMAL LOW (ref 3.87–5.11)
RDW: 12 % (ref 11.5–15.5)
WBC: 7.5 10*3/uL (ref 4.0–10.5)
nRBC: 0 % (ref 0.0–0.2)

## 2019-07-25 LAB — BASIC METABOLIC PANEL
Anion gap: 8 (ref 5–15)
BUN: <5 mg/dL — ABNORMAL LOW (ref 6–20)
CO2: 25 mmol/L (ref 22–32)
Calcium: 8.5 mg/dL — ABNORMAL LOW (ref 8.9–10.3)
Chloride: 106 mmol/L (ref 98–111)
Creatinine, Ser: 0.62 mg/dL (ref 0.44–1.00)
GFR calc Af Amer: >60 mL/min (ref >60)
GFR calc non Af Amer: >60 mL/min (ref >60)
Glucose, Bld: 132 mg/dL — ABNORMAL HIGH (ref 70–99)
Potassium: 3.6 mmol/L (ref 3.5–5.1)
Sodium: 139 mmol/L (ref 135–145)

## 2019-07-25 NOTE — Discharge Summary (Addendum)
Physician Discharge Summary  Patient ID: Sarah Newman MRN: [id number] DOB/AGE: 08-16-1975 43 y.o.  Admit date: 07/21/2019 Discharge date: 07/25/2019  Admission Diagnoses: Ovarian cancer [facility])  Discharge Diagnoses:  Principal Problem:   Ovarian cancer [facility]) Active Problems:   Pelvic mass in female   Malignant neoplasm of left ovary [facility])   Discharged Condition:  The patient is in good condition and stable for discharge.   Hospital Course: On 07/21/2019, the patient underwent the following: Procedure(s): EXPLORATORY LAPAROTOMY, TOTAL ABDOMINAL HYSTERECTOMY, BILATERAL SALPINGO OOPHORECTOMY, OMENTECTOMY.  The postoperative course included a post-operative ileus that resolved after bowel rest.  She was discharged to home on postoperative day 4 tolerating a regular diet, having bowel movements and passing flatus, ambulating, pain controlled with oral medications, voiding, and instructed on lovenox injections.  Consults: None  Significant Diagnostic Studies: None  Treatments: surgery: see above  Discharge Exam (am assessment): Blood pressure (!) 143/94, pulse 71, temperature 98.1 F (36.7 C), temperature source Oral, resp. rate 17, height 5\' 2"  (1.575 m), weight 230 lb (104.3 kg), last menstrual period 07/05/2019, SpO2 97 %. General appearance: alert, cooperative and no distress Resp: clear to auscultation bilaterally Cardio: regular rate and rhythm, S1, S2 normal, no murmur, click, rub or gallop GI: soft, non-tender; bowel sounds normal; no masses,  no organomegaly Extremities: extremities normal, atraumatic, no cyanosis or edema Incision/Wound: Op site dressing to the abdomen marked but no active drainage noted underneath  Disposition: Discharge disposition: 01-Home or Self Care       Discharge Instructions    Call MD for:  difficulty breathing, headache or visual disturbances   Complete by: As directed    Call MD for:  extreme fatigue   Complete by: As directed    Call MD for:  hives   Complete by: As directed    Call MD for:  persistant dizziness or light-headedness   Complete by: As directed    Call MD for:  persistant nausea and vomiting   Complete by: As directed    Call MD for:  redness, tenderness, or signs of infection (pain, swelling, redness, odor or green/yellow discharge around incision site)   Complete by: As directed    Call MD for:  severe uncontrolled pain   Complete by: As directed    Call MD for:  temperature >100.4   Complete by: As directed    Diet - low sodium heart healthy   Complete by: As directed    Discharge wound care:   Complete by: As directed    You can remove the dressing from your incision five days after surgery and you do not need to reapply a new dressing   Driving Restrictions   Complete by: As directed    No driving for 2 weeks from surgery.  Do not take narcotics and drive.   Increase activity slowly   Complete by: As directed    Lifting restrictions   Complete by: As directed    No lifting greater than 10 lbs.   Sexual Activity Restrictions   Complete by: As directed    No sexual activity, nothing in the vagina, for 6-8 weeks.     Allergies as of 07/25/2019   No Known Allergies     Medication List    STOP taking these medications   oxyCODONE-acetaminophen 5-325 MG tablet Commonly known as: PERCOCET/ROXICET     TAKE these medications   acetaminophen 500 MG tablet Commonly known as: TYLENOL Take 1,000 mg by mouth every 6 (six) hours  as needed for moderate pain.   albuterol 108 (90 Base) MCG/ACT inhaler Commonly known as: VENTOLIN HFA Inhale 2 puffs into the lungs every 4 (four) hours as needed for wheezing or shortness of breath.   enoxaparin 40 MG/0.4ML injection Commonly known as: LOVENOX Inject 0.4 mLs (40 mg total) into the skin daily for 25 doses.   famotidine 20 MG tablet Commonly known as: PEPCID Take 1 tablet (20 mg total) by mouth 2 (two) times daily.   ferrous sulfate 325  (65 FE) MG tablet Take 325 mg by mouth daily with breakfast.   ibuprofen 800 MG tablet Commonly known as: ADVIL Take 1 tablet (800 mg total) by mouth every 8 (eight) hours as needed for moderate pain. For AFTER surgery only   multivitamin with minerals Tabs tablet Take 1 tablet by mouth daily.   omeprazole 20 MG capsule Commonly known as: PRILOSEC Take 40 mg by mouth daily.   ondansetron 4 MG tablet Commonly known as: ZOFRAN Take 1 tablet (4 mg total) by mouth every 6 (six) hours.   oxyCODONE 5 MG immediate release tablet Commonly known as: Oxy IR/ROXICODONE Take 1 tablet (5 mg total) by mouth every 4 (four) hours as needed for severe pain. Do not take and drive What changed: additional instructions   senna-docusate 8.6-50 MG tablet Commonly known as: Senokot-S Take 2 tablets by mouth at bedtime. For AFTER surgery, do not take if having diarrhea            Discharge Care Instructions  (From admission, onward)         Start     Ordered   07/25/19 0000  Discharge wound care:    Comments: You can remove the dressing from your incision five days after surgery and you do not need to reapply a new dressing   07/25/19 1638         Follow-up Information    Lafonda Mosses, MD In 1 week.   Specialty: Gynecologic Oncology Why: phone call for pathology review Contact information: [place] [phone]        Lafonda Mosses, MD In 3 weeks.   Specialty: Gynecologic Oncology Why: clinic visit Contact information: [place] [address] [phone]           Greater than thirty minutes were spend for face to face discharge instructions and discharge orders/summary in EPIC.   Signed: [name] 07/25/2019, 4:41 PM

## 2019-07-25 NOTE — Progress Notes (Signed)
Discharge instructions given to patient. Patient had no questions. NT or writer will wheel patient out once family comes in  

## 2019-07-25 NOTE — Progress Notes (Signed)
4 Days Post-Op Procedure(s) (LRB): EXPLORATORY LAPAROTOMY, TOTAL ABDOMINAL HYSTERECTOMY, BILATERAL SALPINGO OOPHORECTOMY, OMENTECTOMY (N/A)  Subjective: Patient reports having bowel movements and passing flatus.  Tolerating clear liquids with no nausea or emesis.  Reports intermittent incisional soreness.  Ambulating without difficulty.  States she has been instructed on lovenox injections and her husband will be administering them at home. Requesting a grilled cheese sandwich this am.  No other concerns voiced.   Objective: Vital signs in last 24 hours: Temp:  [98.1 F (36.7 C)-98.8 F (37.1 C)] 98.1 F (36.7 C) (12/21 0625) Pulse Rate:  [73-85] 85 (12/21 0625) Resp:  [18] 18 (12/21 0625) BP: (131-150)/(98-107) 131/98 (12/21 0625) SpO2:  [96 %-100 %] 98 % (12/21 0625) Weight:  [230 lb (104.3 kg)] 230 lb (104.3 kg) (12/21 0625) Last BM Date: 07/25/19  Intake/Output from previous day: 12/20 0701 - 12/21 0700 In: 2720 [P.O.:720; I.V.:2000] Out: 2400 [Urine:2400]  Physical Examination: General: alert, cooperative and no distress Resp: clear to auscultation bilaterally Cardio: regular rate and rhythm, S1, S2 normal, no murmur, click, rub or gallop GI: soft, non-tender; bowel sounds normal; no masses,  no organomegaly and incision: midline op site dressing intact and stainmarked, no active drainage noted Extremities: extremities normal, atraumatic, no cyanosis or edema  Labs: WBC/Hgb/Hct/Plts:  7.5/9.1/28.6/451 (12/21 [id number]) BUN/Cr/glu/ALT/AST/amyl/lip:  <5/0.62/--/--/--/--/-- (12/21 [id number])  Assessment: 43 y.o. s/p Procedure(s): EXPLORATORY LAPAROTOMY, TOTAL ABDOMINAL HYSTERECTOMY, BILATERAL SALPINGO OOPHORECTOMY, OMENTECTOMY: stable Pain:  Pain is well-controlled on PRN medications.  Heme: Hgb 9.1 and Hct 28.6 this am. Stable post-operatively.  CV: BP and HR stable post-operatively. Continue to monitor with ordered vital sign measurements.  GI:  Tolerating po: Yes. Resolving  ileus-having BMs and passing flatus.  GU: Voiding adequate amounts.  Creatinine 0.62  FEN: No critical values this am.  Prophylaxis: SCDs and lovenox  Plan: Diet as tolerated Plan to saline lock IV if tolerates  Plan for discharge later today if diet tolerated, per Dr. Berline Lopes   LOS: 4 days    [name] 07/25/2019, 10:05 AM

## 2019-07-26 ENCOUNTER — Telehealth: Payer: Self-pay

## 2019-07-26 NOTE — Telephone Encounter (Signed)
Sarah Newman  states that she is doing well. She is moving bowels and drinking well.  She took of the Honeycomb dressing today  Incision D&I with Derma bond over incision. Site a little itchy.Suggested after cleaning incision site to apply a light amount of lotion to help add moisture to skin. Her appetite has not picked up yet. Suggested soft foods such as pasta,yogart, eggs. She is not experiencing n/v. Having some gas. Occasional belching, Encouraged her to get up and ambulate every hour.  The more she moves around the better she will feel. She is using Tylenol every 6 hrs. Suggested alt. with the Ibuprofen as this can help the post operative inflammation /pain. She has the office number  If she has any questions or concerns.

## 2019-08-01 ENCOUNTER — Telehealth: Payer: Self-pay | Admitting: *Deleted

## 2019-08-01 ENCOUNTER — Other Ambulatory Visit: Payer: Self-pay | Admitting: Oncology

## 2019-08-01 ENCOUNTER — Telehealth: Payer: Self-pay | Admitting: Oncology

## 2019-08-01 DIAGNOSIS — C562 Malignant neoplasm of left ovary: Secondary | ICD-10-CM

## 2019-08-01 NOTE — Telephone Encounter (Signed)
Scheduled genetics appt and lab. Called and spoke with the patient, gave appt date/time

## 2019-08-01 NOTE — Progress Notes (Signed)
Gynecologic Oncology Multi-Disciplinary Disposition Conference Note  Date of the Conference: 08/01/2019  Patient Name: Sarah Newman  Primary GYN Oncologist: Dr. 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 Disposition is to chemotherapy with a platinum doublet.  A referral has also been placed for genetic counseling.   This Multidisciplinary conference took place involving physicians from [place], [address], Radiation Oncology, Pathology, Radiology along with the Gynecologic Oncology Nurse Practitioner and RN.  Comprehensive assessment of the patient's malignancy, staging, need for surgery, chemotherapy, radiation therapy, and need for further testing were reviewed. Supportive measures, both inpatient and following discharge were also discussed. The recommended plan of care is documented. Greater than 35 minutes were spent correlating and coordinating this patient's care.

## 2019-08-01 NOTE — Telephone Encounter (Signed)
Sarah Newman and scheduled appointment for 08/03/19 at 9:30.

## 2019-08-03 ENCOUNTER — Encounter: Payer: Self-pay | Admitting: Oncology

## 2019-08-03 ENCOUNTER — Other Ambulatory Visit: Payer: Self-pay

## 2019-08-03 ENCOUNTER — Encounter: Payer: Self-pay | Admitting: Hematology and Oncology

## 2019-08-03 ENCOUNTER — Encounter: Payer: Self-pay | Admitting: *Deleted

## 2019-08-03 ENCOUNTER — Inpatient Hospital Stay (HOSPITAL_BASED_OUTPATIENT_CLINIC_OR_DEPARTMENT_OTHER): Payer: BC Managed Care – PPO | Admitting: Hematology and Oncology

## 2019-08-03 VITALS — BP 126/87 | HR 82 | Temp 98.2°F | Resp 20 | Ht 62.0 in | Wt 221.2 lb

## 2019-08-03 DIAGNOSIS — C562 Malignant neoplasm of left ovary: Secondary | ICD-10-CM | POA: Diagnosis not present

## 2019-08-03 DIAGNOSIS — R6881 Early satiety: Secondary | ICD-10-CM | POA: Diagnosis not present

## 2019-08-03 DIAGNOSIS — Z23 Encounter for immunization: Secondary | ICD-10-CM | POA: Diagnosis not present

## 2019-08-03 DIAGNOSIS — K760 Fatty (change of) liver, not elsewhere classified: Secondary | ICD-10-CM | POA: Diagnosis not present

## 2019-08-03 DIAGNOSIS — R350 Frequency of micturition: Secondary | ICD-10-CM | POA: Diagnosis not present

## 2019-08-03 DIAGNOSIS — R630 Anorexia: Secondary | ICD-10-CM | POA: Diagnosis not present

## 2019-08-03 DIAGNOSIS — K279 Peptic ulcer, site unspecified, unspecified as acute or chronic, without hemorrhage or perforation: Secondary | ICD-10-CM | POA: Diagnosis not present

## 2019-08-03 DIAGNOSIS — D251 Intramural leiomyoma of uterus: Secondary | ICD-10-CM | POA: Diagnosis not present

## 2019-08-03 DIAGNOSIS — D3912 Neoplasm of uncertain behavior of left ovary: Secondary | ICD-10-CM | POA: Diagnosis not present

## 2019-08-03 DIAGNOSIS — Z79899 Other long term (current) drug therapy: Secondary | ICD-10-CM | POA: Diagnosis not present

## 2019-08-03 DIAGNOSIS — R112 Nausea with vomiting, unspecified: Secondary | ICD-10-CM | POA: Diagnosis not present

## 2019-08-03 DIAGNOSIS — J45909 Unspecified asthma, uncomplicated: Secondary | ICD-10-CM | POA: Diagnosis not present

## 2019-08-03 MED ORDER — INFLUENZA VAC SPLIT QUAD 0.5 ML IM SUSY
0.5000 mL | PREFILLED_SYRINGE | Freq: Once | INTRAMUSCULAR | Status: AC
  Administered 2019-08-03: 0.5 mL via INTRAMUSCULAR

## 2019-08-03 MED ORDER — INFLUENZA VAC SPLIT QUAD 0.5 ML IM SUSY
PREFILLED_SYRINGE | INTRAMUSCULAR | Status: AC
  Filled 2019-08-03: qty 0.5

## 2019-08-03 MED ORDER — DEXAMETHASONE 4 MG PO TABS: ORAL_TABLET | ORAL | 5 refills | Status: DC

## 2019-08-03 MED ORDER — LIDOCAINE-PRILOCAINE 2.5-2.5 % EX CREA: TOPICAL_CREAM | CUTANEOUS | 3 refills | Status: DC

## 2019-08-03 MED ORDER — ONDANSETRON HCL 8 MG PO TABS: 8.0000 mg | ORAL_TABLET | Freq: Three times a day (TID) | ORAL | 1 refills | Status: DC | PRN

## 2019-08-03 MED ORDER — PROCHLORPERAZINE MALEATE 10 MG PO TABS: 10.0000 mg | ORAL_TABLET | Freq: Four times a day (QID) | ORAL | 1 refills | Status: DC | PRN

## 2019-08-03 MED FILL — ONDANSETRON HCL 8 MG TABLET: 8 | 10 days supply | Qty: 30 | Fill #0

## 2019-08-03 MED FILL — LIDOCAINE-PRILOCAINE CREAM: 2.5-2.5 | 10 days supply | Qty: 30 | Fill #0

## 2019-08-03 MED FILL — PROCHLORPERAZINE 10 MG TAB: 10 | 7 days supply | Qty: 30 | Fill #0

## 2019-08-03 MED FILL — DEXAMETHASONE 4 MG TABLET: 4 | 84 days supply | Qty: 16 | Fill #0

## 2019-08-03 NOTE — Progress Notes (Signed)
Met with Sarah Newman after her appointment with Dr. Alvy Bimler.  She was given the [employer].  Discussed what to expect next and encouraged her to call with any questions.  Also sent a message to [employer], Estate manager/land agent to discuss grants.

## 2019-08-03 NOTE — Progress Notes (Signed)
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 The intent is of curative intent.  We discussed some of the risks, benefits, side-effects of carboplatin & Taxol. Treatment is intravenous, every 3 weeks x 6 cycles  Some of the short term side-effects included, though not limited to, including weight loss, life threatening infections, risk of allergic reactions, need for transfusions of blood products, nausea, vomiting, change in bowel habits, loss of hair, admission to hospital for various reasons, and risks of death.   Long term side-effects are also discussed including risks of infertility, permanent damage to nerve function, hearing loss, chronic fatigue, kidney damage with possibility needing hemodialysis, and rare secondary malignancy including bone marrow disorders.  The patient is aware that the response rates discussed earlier is not guaranteed.  After a long discussion, patient made an informed decision to proceed with the prescribed plan of care.   Patient education material was dispensed. We discussed premedication with dexamethasone before chemotherapy. She has appointment pending to see genetics I recommend port placement and chemo education class I recommend her treatment to be given in the early part of the week next month I do not plan G-CSF support We discussed potential participation in the neuropathy study and she is interested to participate  We discussed the importance of preventive care and reviewed the vaccination programs. She does not have any prior allergic reactions to influenza vaccination. She agrees to proceed with influenza vaccination today and we will administer it today at the clinic.    Orders Placed This Encounter  Procedures  . IR IMAGING GUIDED PORT  INSERTION    Standing Status:   Future    Standing Expiration Date:   10/01/2020    Order Specific Question:   Reason for Exam (SYMPTOM  OR DIAGNOSIS REQUIRED)    Answer:   need port for chemo to start 1/19    Order Specific Question:   Is the patient pregnant?    Answer:   No    Order Specific Question:   Preferred Imaging Location?    Answer:   [facility]  . CBC with Differential ([place] Only)    Standing Status:   Standing    Number of Occurrences:   20    Standing Expiration Date:   08/02/2020  . CMP ([place] only)    Standing Status:   Standing    Number of Occurrences:   20    Standing Expiration Date:   08/02/2020     CHIEF COMPLAINTS/PURPOSE OF CONSULTATION:  Newly diagnosed ovarian cancer status post resection, for adjuvant treatment  HISTORY OF PRESENTING ILLNESS:  Sarah Newman 43 y.o. female is here because of recent diagnosis of ovarian cancer I have reviewed her chart and materials related to her cancer extensively and collaborated history with the patient. Summary of oncologic history is as follows: Oncology History Overview Note  Clear cell features   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HCC)  07/02/2019 Imaging   Ct scan of abdomen and pelvis 1. There is a 16 cm complex cystic mass in the pelvis containing thickened septations located near midline, abutting the superior aspect of the uterus. I suspect this mass is adnexal/ovarian in origin rather than uterine in origin. Specifically, I suspect the mass likely arises from the left ovary/adnexa and is  most consistent with a neoplasm. Malignancy is certainly not excluded on this study. Recommend a pelvic ultrasound for further evaluation. 2. The rounded more solid-appearing structure in the right side of the pelvis is probably the right ovary. Recommend attention on ultrasound. 3. The small amount of fluid in the pelvis is likely reactive to the complex cystic mass likely rising from the left  ovary/adnexa. 4. No cause for blood in vomit or black stools identified. No convincing evidence of a perforated or inflamed gastric or duodenal ulcer. 5. Hepatic steatosis.   07/02/2019 Imaging   MRI pelvis 1. 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 There are 2 enhancing nodules within the central upper abdomen raising the possibility of peritoneal carcinomatosis. Additionally, there is a small amount fluid within the pelvis. Malignant ascites not excluded. 2. Fibroid uterus. 3. Hepatic steatosis.   07/02/2019 Imaging   US pelvis 1. There is a large mass in the pelvis also seen on CT imaging. A separate left ovary is not visualized. 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 The mass is suspicious for malignancy. Recommend gynecologic consultation. 2. The right ovary demonstrates an irregular ill-defined hypoechoic hypervascular region centrally. The findings are nonspecific in the right ovary. However, given the apparent left ovarian neoplasm suspicious for malignancy, recommend an MRI to further assess the right ovary. 3. Uterine fibroid measuring 3.6 cm.     07/21/2019 Pathology Results   FINAL MICROSCOPIC DIAGNOSIS: A. 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 - See oncology table. B. OMENTUM, RESECTION: - Omental lymph node with metastatic carcinoma (1/1). - Omental adipose tissue with foci of inflammation and reactive mesothelial changes. C. FALLOPIAN TUBE, LEFT, RESECTION: - Fallopian tube with focal, mild inflammation and fibrosis. - No tumor identified. D. UTERUS, CERVIX, RIGHT TUBE AND OVARY: - Cervix Nabothian cyst and squamous metaplasia. - Endometrium Proliferative. No hyperplasia or carcinoma. - Myometrium Leiomyomata. No malignancy. -Right ovary Poorly differentiated carcinoma, 4.8 cm. See  oncology table and comment. -Right Fallopian tube Benign paratubal cyst. No endometriosis or malignancy. ONCOLOGY TABLE: OVARY or FALLOPIAN TUBE or PRIMARY PERITONEUM: Procedure: Bilateral f-oophorectomy, hysterectomy and omentectomy. Specimen Integrity: Intact Tumor Site: Left ovary and right ovary. See comment. Ovarian Surface Involvement: Not identified. Fallopian Tube Surface Involvement: Not identified. Tumor Size: Left ovary: 18.6 x 16.0 x 7.2 cm. Right ovary: 4.8 x 3.2 x 3.2 cm. Histologic Type: Clear cell carcinoma. See comment. Histologic Grade: High-grade. Other Tissue/ Organ Involvement: Omental lymph node with metastatic carcinoma. Peritoneal/Ascitic Fluid: Pending. Treatment Effect: No known presurgical therapy. Pathologic Stage Classification (pTNM, AJCC 8th Edition): pT3a, pN1a. Representative Tumor Block: A2, A3, A4, A5 A6, A7 and A8. Comment(s): The left ovarian tumor is 18.6 cm in greatest dimension and has features of clear cell carcinoma. The right ovarian tumor is 4.8 cm in greatest dimension and is poorly differentiated with focal features consistent with clear cell carcinoma. The pattern of involvement suggests that the right ovarian tumor is likely metastatic from the larger left ovarian clear cell carcinoma. Sections of the omentum show a lymph node with metastatic carcinoma. Sections of the remainder of the omentum do not show involvement by carcinoma.   07/21/2019 Surgery   Procedure(s) Performed: Exploratory laparotomy with radical tumor debulking including total hysterectomy, bilateral salpingo-oophorectomy, infra-gastric omentectomy, and washings.   Surgeon: Jeral Pinch, MD    Operative Findings:  On EUA, mobile uterus and ovarian mass, situated out of the pelvis, spanning 6-8 cm above the umbilicus.  On intra-abdominal entry, inflamed omentum adherent to much of the 16 cm left ovarian mass, adhesions easily lysed bluntly.  No nodularity of the ovarian mass  however on delivery of the mass through the midline incision, there was Intra-Op rupture of one of the smaller cystic components with drainage of clear brown-tinged fluid.  Grossly normal-appearing left fallopian tube as well as right tube.  Right ovary mildly enlarged measuring approximately 4 cm and firm/nodular, suspicious for tumor involvement.  Uterus 8-10 cm with 4 cm anterior mid body fibroid.  Some very small volume miliary disease along right pelvic sidewall, removed with uterine specimen.  Evidence of small diverticular disease.  Small bowel normal appearing although multiple loops of bowel adherent to each other, especially by mesentery, and an inflammatory manner.  An approximately 2 x 2 centimeter nodule suspicious for tumor implant was found in the omentum just inferior to the greater curvature of the stomach.  Additional small (less than 5 mm) implants noted on the stomach and posterior aspect of the left lobe of the liver.  Otherwise the liver surface and diaphragm were smooth.   08/03/2019 Cancer Staging   Staging form: Ovary, Fallopian Tube, and Primary Peritoneal Carcinoma, AJCC 8th Edition - Pathologic: Stage IIIA2 (pT3a, pN1, cM0) - Signed by Heath Lark, MD on 08/03/2019    Overall, she is healing well She has mild incisional pain Denies nausea or changes in bowel habits She has lost approximately 10 pounds since last time I saw her She is interested to return back to work while undergoing treatment MEDICAL HISTORY:  Past Medical History:  Diagnosis Date  . Anemia   . Asthma   . Pelvic mass   . Ulcer, stomach peptic     SURGICAL HISTORY: Past Surgical History:  Procedure Laterality Date  . CHOLECYSTECTOMY N/A 02/16/2015   Procedure: LAPAROSCOPIC CHOLECYSTECTOMY WITH INTRAOPERATIVE CHOLANGIOGRAM;  Surgeon: Johnathan Hausen, MD;  Location: [facility];  Service: General;  Laterality: N/A;  . SALPINGOOPHORECTOMY N/A 07/21/2019   Procedure: EXPLORATORY LAPAROTOMY, TOTAL ABDOMINAL  HYSTERECTOMY, BILATERAL SALPINGO OOPHORECTOMY, OMENTECTOMY;  Surgeon: Lafonda Mosses, MD;  Location: [facility];  Service: Gynecology;  Laterality: N/A;    SOCIAL HISTORY: Social History   Socioeconomic History  . Marital status: Married    Spouse name: Herbie Baltimore  . Number of children: 2  . Years of education: Not on file  . Highest education level: Not on file  Occupational History  . Not on file  Tobacco Use  . Smoking status: Never Smoker  . Smokeless tobacco: Never Used  Substance and Sexual Activity  . Alcohol use: Yes    Comment: Social drinker  . Drug use: No  . Sexual activity: Not Currently  Other Topics Concern  . 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  Newly married as of October 2020.   2 boys, age 66 and 11   Social Determinants of Health   Financial Resource Strain:   . Difficulty of Paying Living Expenses: Not on file  Food Insecurity:   . Worried About Charity fundraiser in the Last Year: Not on file  . Ran Out of Food in the Last Year: Not on file  Transportation Needs:   . Lack of Transportation (Medical): Not on file  . Lack of Transportation (Non-Medical): Not on file  Physical Activity:   .  Days of Exercise per Week: Not on file  . Minutes of Exercise per Session: Not on file  Stress:   . Feeling of Stress : Not on file  Social Connections:   . Frequency of Communication with Friends and Family: Not on file  . Frequency of Social Gatherings with Friends and Family: Not on file  . Attends Religious Services: Not on file  . Active Member of Clubs or Organizations: Not on file  . Attends Archivist Meetings: Not on file  . Marital Status: Not on file  Intimate Partner Violence:   . Fear of Current or Ex-Partner: Not on file  . Emotionally Abused: Not on file  . Physically Abused: Not on file  . Sexually Abused: Not on file    FAMILY HISTORY: Family History  Problem Relation Age of Onset   . Colon cancer Father   . Prostate cancer Father   . Colon cancer Paternal Aunt   . Ovarian cancer Paternal Grandmother   . Breast cancer Cousin   . Endometriosis Mother     ALLERGIES:  has No Known Allergies.  MEDICATIONS:  Current Outpatient Medications  Medication Sig Dispense Refill  . acetaminophen (TYLENOL) 500 MG tablet Take 1,000 mg by mouth every 6 (six) hours as needed for moderate pain.     Marland Kitchen albuterol (PROVENTIL HFA;VENTOLIN HFA) 108 (90 Base) MCG/ACT inhaler Inhale 2 puffs into the lungs every 4 (four) hours as needed for wheezing or shortness of breath.    . dexamethasone (DECADRON) 4 MG tablet Take 2 tabs at the night before and 2 tabs the morning of chemotherapy, every 3 weeks, by mouth, please dispense 24 tabs for total 6 cycles of treatment 24 tablet 5  . enoxaparin (LOVENOX) 40 MG/0.4ML injection Inject 0.4 mLs (40 mg total) into the skin daily for 25 doses. 10 mL 0  . famotidine (PEPCID) 20 MG tablet Take 1 tablet (20 mg total) by mouth 2 (two) times daily. 30 tablet 0  . ibuprofen (ADVIL) 800 MG tablet Take 1 tablet (800 mg total) by mouth every 8 (eight) hours as needed for moderate pain. For AFTER surgery only 30 tablet 1  . lidocaine-prilocaine (EMLA) cream Apply to affected area once 30 g 3  . Multiple Vitamin (MULTIVITAMIN WITH MINERALS) TABS tablet Take 1 tablet by mouth daily.    Marland Kitchen omeprazole (PRILOSEC) 20 MG capsule Take 40 mg by mouth daily.    . ondansetron (ZOFRAN) 8 MG tablet Take 1 tablet (8 mg total) by mouth every 8 (eight) hours as needed for refractory nausea / vomiting. 30 tablet 1  . oxyCODONE (OXY IR/ROXICODONE) 5 MG immediate release tablet Take 1 tablet (5 mg total) by mouth every 4 (four) hours as needed for severe pain. Do not take and drive 20 tablet 0  . prochlorperazine (COMPAZINE) 10 MG tablet Take 1 tablet (10 mg total) by mouth every 6 (six) hours as needed (Nausea or vomiting). 30 tablet 1  . senna-docusate (SENOKOT-S) 8.6-50 MG tablet  Take 2 tablets by mouth at bedtime. For AFTER surgery, do not take if having diarrhea 60 tablet 1   No current facility-administered medications for this visit.    REVIEW OF SYSTEMS:   Constitutional: Denies fevers, chills or abnormal night sweats Eyes: Denies blurriness of vision, double vision or watery eyes Ears, nose, mouth, throat, and face: Denies mucositis or sore throat Respiratory: Denies cough, dyspnea or wheezes Cardiovascular: Denies palpitation, chest discomfort or lower extremity swelling Gastrointestinal:  Denies nausea,  heartburn or change in bowel habits Skin: Denies abnormal skin rashes Lymphatics: Denies new lymphadenopathy or easy bruising Neurological:Denies numbness, tingling or new weaknesses Behavioral/Psych: Mood is stable, no new changes  All other systems were reviewed with the patient and are negative.  PHYSICAL EXAMINATION: ECOG PERFORMANCE STATUS: 1 - Symptomatic but completely ambulatory  Vitals:   08/03/19 0951  BP: 126/87  Pulse: 82  Resp: 20  Temp: 98.2 F (36.8 C)  SpO2: 100%   Filed Weights   08/03/19 0951  Weight: 221 lb 3.2 oz (100.3 kg)    GENERAL:alert, no distress and comfortable SKIN: skin color, texture, turgor are normal, no rashes or significant lesions EYES: normal, conjunctiva are pink and non-injected, sclera clear OROPHARYNX:no exudate, no erythema and lips, buccal mucosa, and tongue normal  NECK: supple, thyroid normal size, non-tender, without nodularity LYMPH:  no palpable lymphadenopathy in the cervical, axillary or inguinal LUNGS: clear to auscultation and percussion with normal breathing effort HEART: regular rate & rhythm and no murmurs and no lower extremity edema ABDOMEN:abdomen soft, mild tenderness on incision scar. Musculoskeletal:no cyanosis of digits and no clubbing  PSYCH: alert & oriented x 3 with fluent speech NEURO: no focal motor/sensory deficits  LABORATORY DATA:  I have reviewed the data as  listed Lab Results  Component Value Date   WBC 7.5 07/25/2019   HGB 9.1 (L) 07/25/2019   HCT 28.6 (L) 07/25/2019   MCV 88.8 07/25/2019   PLT 451 (H) 07/25/2019   Recent Labs    07/02/19 1206 07/15/19 1130 07/23/19 0451 07/24/19 1105 07/25/19 0412  NA 139 133* 139 139 139  K 3.5 3.8 4.0 3.8 3.6  CL 100 102 104 103 106  CO2 27 23 28 28 25   GLUCOSE 131* 118* 142* 112* 132*  BUN 12 8 6 6  <5*  CREATININE 0.67 0.72 0.77 0.70 0.62  CALCIUM 8.5* 8.9 8.4* 8.7* 8.5*  GFRNONAA >60 >60 >60 >60 >60  GFRAA >60 >60 >60 >60 >60  PROT 7.5 8.1  --  6.7  --   ALBUMIN 3.8 4.0  --  3.0*  --   AST 76* 93*  --  77*  --   ALT 73* 77*  --  56*  --   ALKPHOS 98 107  --  90  --   BILITOT 0.5 1.1  --  0.6  --     RADIOGRAPHIC STUDIES: I have reviewed her prior CT imaging I have personally reviewed the radiological images as listed and agreed with the findings in the report.  I spent 55 minutes counseling the patient face to face. The total time spent in the appointment was 60 minutes and more than 50% was on counseling.  All questions were answered. The patient knows to call the clinic with any problems, questions or concerns.  Heath Lark, MD 08/03/2019 10:39 AM

## 2019-08-03 NOTE — Assessment & Plan Note (Signed)
We reviewed the NCCN guidelines We discussed the role of chemotherapy. The intent is of curative intent.  We discussed some of the risks, benefits, side-effects of carboplatin & Taxol. Treatment is intravenous, every 3 weeks x 6 cycles  Some of the short term side-effects included, though not limited to, including weight loss, life threatening infections, risk of allergic reactions, need for transfusions of blood products, nausea, vomiting, change in bowel habits, loss of hair, admission to hospital for various reasons, and risks of death.   Long term side-effects are also discussed including risks of infertility, permanent damage to nerve function, hearing loss, chronic fatigue, kidney damage with possibility needing hemodialysis, and rare secondary malignancy including bone marrow disorders.  The patient is aware that the response rates discussed earlier is not guaranteed.  After a long discussion, patient made an informed decision to proceed with the prescribed plan of care.   Patient education material was dispensed. We discussed premedication with dexamethasone before chemotherapy. She has appointment pending to see genetics I recommend port placement and chemo education class I recommend her treatment to be given in the early part of the week next month I do not plan G-CSF support We discussed potential participation in the neuropathy study and she is interested to participate  We discussed the importance of preventive care and reviewed the vaccination programs. She does not have any prior allergic reactions to influenza vaccination. She agrees to proceed with influenza vaccination today and we will administer it today at the clinic.

## 2019-08-03 NOTE — Research (Signed)
08/03/19 at 12:59am- [id number] research referral made by Dr. Alvy Bimler.  This pt was reviewed by Foye Spurling, RN, and the pt was deemed eligible for the study.  Dr. Alvy Bimler introduced the pt to the study, and the pt was interested in meeting with the research nurse to learn more about the study.  The research nurse met with the pt for 20 minutes going over the study and the study assessments.  The pt was given the consent form, hipaa form, RN's business card, and the [facility] research brochure to take home and read.  The pt agreed to discuss her possible enrollment next week with the research nurse.  The research nurse will contact the pt on 08/08/19 to see if the pt has any questions about the study.  The pt was thanked for her time and consideration of this trial. Brion Aliment RN, BSN, CCRP  Clinical Research Nurse 08/03/2019 1:04 PM

## 2019-08-03 NOTE — Progress Notes (Signed)
START ON PATHWAY REGIMEN - Ovarian     A cycle is every 21 days:     Paclitaxel      Carboplatin   **Always confirm dose/schedule in your pharmacy ordering system**  Patient Characteristics: Postoperative without Neoadjuvant Therapy (Pathologic Staging), Newly Diagnosed, Adjuvant Therapy, Stage IIB and Stage IIIA/B/C Optimal Cytoreduction Therapeutic Status: Postoperative without Neoadjuvant Therapy (Pathologic Staging) BRCA Mutation Status: Awaiting Test Results AJCC 8 Stage Grouping: Unknown AJCC M Category: cM0 AJCC T Category: pT3 AJCC N Category: pN1 Intent of Therapy: Curative Intent, Discussed with Patient

## 2019-08-04 ENCOUNTER — Telehealth: Payer: Self-pay | Admitting: Genetic Counselor

## 2019-08-04 ENCOUNTER — Telehealth: Payer: Self-pay | Admitting: Hematology and Oncology

## 2019-08-04 NOTE — Telephone Encounter (Signed)
Called patient regarding upcoming virtual visit, patient is notified. 

## 2019-08-04 NOTE — Telephone Encounter (Signed)
Scheduled appt per 12/30 sch message  -unable to  Reach pt . Left message with appt dates and times

## 2019-08-08 ENCOUNTER — Telehealth: Payer: Self-pay | Admitting: *Deleted

## 2019-08-08 ENCOUNTER — Other Ambulatory Visit: Payer: Self-pay | Admitting: *Deleted

## 2019-08-08 DIAGNOSIS — C562 Malignant neoplasm of left ovary: Secondary | ICD-10-CM

## 2019-08-08 LAB — CYTOLOGY - NON PAP

## 2019-08-08 NOTE — Telephone Encounter (Signed)
08/08/19 at 11:55am - [id number] study notes- The research nurse called and talked to the pt this morning about the study. She said that she has read "parts of the consent", and she is still interested in participating in the study.  The research nurse and the pt have agreed to meet in person on 08/12/19 after her MD appt to go over the study in detail and possibly sign the consent form.  The pt's first treatment is scheduled to start on 08/22/19.   Brion Aliment RN, BSN, CCRP Clinical Research Nurse 08/08/2019 11:58 AM

## 2019-08-09 ENCOUNTER — Inpatient Hospital Stay: Payer: BC Managed Care – PPO

## 2019-08-09 ENCOUNTER — Encounter: Payer: Self-pay | Admitting: Genetic Counselor

## 2019-08-09 ENCOUNTER — Inpatient Hospital Stay: Payer: BC Managed Care – PPO | Attending: Gynecologic Oncology | Admitting: Genetic Counselor

## 2019-08-09 DIAGNOSIS — Z8041 Family history of malignant neoplasm of ovary: Secondary | ICD-10-CM | POA: Insufficient documentation

## 2019-08-09 DIAGNOSIS — C562 Malignant neoplasm of left ovary: Secondary | ICD-10-CM

## 2019-08-09 DIAGNOSIS — Z90710 Acquired absence of both cervix and uterus: Secondary | ICD-10-CM | POA: Insufficient documentation

## 2019-08-09 DIAGNOSIS — Z90722 Acquired absence of ovaries, bilateral: Secondary | ICD-10-CM | POA: Insufficient documentation

## 2019-08-09 DIAGNOSIS — Z5111 Encounter for antineoplastic chemotherapy: Secondary | ICD-10-CM | POA: Insufficient documentation

## 2019-08-09 DIAGNOSIS — Z8042 Family history of malignant neoplasm of prostate: Secondary | ICD-10-CM

## 2019-08-09 DIAGNOSIS — Z006 Encounter for examination for normal comparison and control in clinical research program: Secondary | ICD-10-CM | POA: Insufficient documentation

## 2019-08-09 DIAGNOSIS — Z800 Family history of malignant neoplasm of digestive organs: Secondary | ICD-10-CM

## 2019-08-09 DIAGNOSIS — Z803 Family history of malignant neoplasm of breast: Secondary | ICD-10-CM

## 2019-08-09 DIAGNOSIS — C561 Malignant neoplasm of right ovary: Secondary | ICD-10-CM | POA: Insufficient documentation

## 2019-08-09 NOTE — Progress Notes (Signed)
REFERRING PROVIDER: Lafonda Mosses, MD [place],  [place] [address]  PRIMARY PROVIDER:  Patient, No Pcp Per  PRIMARY REASON FOR VISIT:  1. 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HCC)   2. Family history of ovarian cancer   3. Family history of prostate cancer   4. Family history of colon cancer   5. Family history of breast cancer      I connected with Ms. Hossain on 08/09/2019 at 9:00 am EDT by MyChart video conference and verified that I am speaking with the correct person using two identifiers.   Patient location: home Provider location: office  HISTORY OF PRESENT ILLNESS:   Ms. Herst, a 44 y.o. female, was seen for a [facility] cancer genetics consultation at the request of Dr. Berline Lopes due to a personal and family history of ovarian cancer.  Ms. Garmon presents to clinic today to discuss the possibility of a hereditary predisposition to cancer, genetic testing, and to further clarify her future cancer risks, as well as potential cancer risks for family members.   In 2020, at the age of 42, Ms. 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 She had a bilateral f-oophorectomy, hysterectomy, and omentectomy on 07/21/2019. Her treatment will also include chemotherapy.  CANCER HISTORY:  Oncology History Overview Note  Clear cell features   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[facility])  07/02/2019 Imaging   Ct scan of abdomen and pelvis 1. There is a 16 cm complex cystic mass in the pelvis containing thickened septations located near midline, abutting the superior aspect of the uterus. I suspect this mass is adnexal/ovarian in origin rather than uterine in origin. Specifically, I suspect the mass likely arises from the left ovary/adnexa and is most consistent with a neoplasm. Malignancy is certainly not excluded on this study. Recommend a pelvic ultrasound for further evaluation. 2. The rounded more solid-appearing structure in the  right side of the pelvis is probably the right ovary. Recommend attention on ultrasound. 3. The small amount of fluid in the pelvis is likely reactive to the complex cystic mass likely rising from the left ovary/adnexa. 4. No cause for blood in vomit or black stools identified. No convincing evidence of a perforated or inflamed gastric or duodenal ulcer. 5. Hepatic steatosis.   07/02/2019 Imaging   MRI pelvis 1. 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 There are 2 enhancing nodules within the central upper abdomen raising the possibility of peritoneal carcinomatosis. Additionally, there is a small amount fluid within the pelvis. Malignant ascites not excluded. 2. Fibroid uterus. 3. Hepatic steatosis.   07/02/2019 Imaging   US pelvis 1. There is a large mass in the pelvis also seen on CT imaging. A separate left ovary is not visualized. 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 The mass is suspicious for malignancy. Recommend gynecologic consultation. 2. The right ovary demonstrates an irregular ill-defined hypoechoic hypervascular region centrally. The findings are nonspecific in the right ovary. However, given the apparent left ovarian neoplasm suspicious for malignancy, recommend an MRI to further assess the right ovary. 3. Uterine fibroid measuring 3.6 cm.     07/21/2019 Pathology Results   FINAL MICROSCOPIC DIAGNOSIS: A. 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 - See oncology table. B. OMENTUM, RESECTION: - Omental lymph node with metastatic carcinoma (1/1). -  Omental adipose tissue with foci of inflammation and reactive mesothelial changes. C. FALLOPIAN TUBE, LEFT, RESECTION: - Fallopian tube with focal, mild inflammation and fibrosis. - No tumor identified. D. UTERUS, CERVIX, RIGHT TUBE AND OVARY: -  Cervix Nabothian cyst and squamous metaplasia. - Endometrium Proliferative. No hyperplasia or carcinoma. - Myometrium Leiomyomata. No malignancy. -Right ovary Poorly differentiated carcinoma, 4.8 cm. See oncology table and comment. -Right Fallopian tube Benign paratubal cyst. No endometriosis or malignancy. ONCOLOGY TABLE: OVARY or FALLOPIAN TUBE or PRIMARY PERITONEUM: Procedure: Bilateral f-oophorectomy, hysterectomy and omentectomy. Specimen Integrity: Intact Tumor Site: Left ovary and right ovary. See comment. Ovarian Surface Involvement: Not identified. Fallopian Tube Surface Involvement: Not identified. Tumor Size: Left ovary: 18.6 x 16.0 x 7.2 cm. Right ovary: 4.8 x 3.2 x 3.2 cm. Histologic Type: Clear cell carcinoma. See comment. Histologic Grade: High-grade. Other Tissue/ Organ Involvement: Omental lymph node with metastatic carcinoma. Peritoneal/Ascitic Fluid: Pending. Treatment Effect: No known presurgical therapy. Pathologic Stage Classification (pTNM, AJCC 8th Edition): pT3a, pN1a. Representative Tumor Block: A2, A3, A4, A5 A6, A7 and A8. Comment(s): The left ovarian tumor is 18.6 cm in greatest dimension and has features of clear cell carcinoma. The right ovarian tumor is 4.8 cm in greatest dimension and is poorly differentiated with focal features consistent with clear cell carcinoma. The pattern of involvement suggests that the right ovarian tumor is likely metastatic from the larger left ovarian clear cell carcinoma. Sections of the omentum show a lymph node with metastatic carcinoma. Sections of the remainder of the omentum do not show involvement by carcinoma.   07/21/2019 Surgery   Procedure(s) Performed: Exploratory laparotomy with radical tumor debulking including total hysterectomy, bilateral salpingo-oophorectomy, infra-gastric omentectomy, and washings.   Surgeon: Jeral Pinch, MD    Operative Findings: On EUA, mobile uterus and ovarian mass, situated  out of the pelvis, spanning 6-8 cm above the umbilicus.  On intra-abdominal entry, inflamed omentum adherent to much of the 16 cm left ovarian mass, adhesions easily lysed bluntly.  No nodularity of the ovarian mass however on delivery of the mass through the midline incision, there was Intra-Op rupture of one of the smaller cystic components with drainage of clear brown-tinged fluid.  Grossly normal-appearing left fallopian tube as well as right tube.  Right ovary mildly enlarged measuring approximately 4 cm and firm/nodular, suspicious for tumor involvement.  Uterus 8-10 cm with 4 cm anterior mid body fibroid.  Some very small volume miliary disease along right pelvic sidewall, removed with uterine specimen.  Evidence of small diverticular disease.  Small bowel normal appearing although multiple loops of bowel adherent to each other, especially by mesentery, and an inflammatory manner.  An approximately 2 x 2 centimeter nodule suspicious for tumor implant was found in the omentum just inferior to the greater curvature of the stomach.  Additional small (less than 5 mm) implants noted on the stomach and posterior aspect of the left lobe of the liver.  Otherwise the liver surface and diaphragm were smooth.   08/03/2019 Cancer Staging   Staging form: Ovary, Fallopian Tube, and Primary Peritoneal Carcinoma, AJCC 8th Edition - Pathologic: Stage IIIA2 (pT3a, pN1, cM0) - Signed by Heath Lark, MD on 08/03/2019      RISK FACTORS:  Menarche was at age 35.  First live birth at age 27.  OCP use for approximately 10 years on and off.  Ovaries intact: no.  Hysterectomy: yes.  Menopausal status: premenopausal prior to surgery on 07/21/19 HRT use: 0 years. Colonoscopy: yes, several years ago.  Mammogram within the last year: no, 2018. Number of breast biopsies: 0. Any excessive radiation exposure in the past: no  Past Medical History:  Diagnosis Date  . Anemia   . Asthma   . Family history of breast  cancer   . Family history of colon cancer   . Family history of ovarian cancer   . Family history of prostate cancer   . Pelvic mass   . Ulcer, stomach peptic     Past Surgical History:  Procedure Laterality Date  . CHOLECYSTECTOMY N/A 02/16/2015   Procedure: LAPAROSCOPIC CHOLECYSTECTOMY WITH INTRAOPERATIVE CHOLANGIOGRAM;  Surgeon: Johnathan Hausen, MD;  Location: [facility];  Service: General;  Laterality: N/A;  . SALPINGOOPHORECTOMY N/A 07/21/2019   Procedure: EXPLORATORY LAPAROTOMY, TOTAL ABDOMINAL HYSTERECTOMY, BILATERAL SALPINGO OOPHORECTOMY, OMENTECTOMY;  Surgeon: Lafonda Mosses, MD;  Location: [facility];  Service: Gynecology;  Laterality: N/A;    Social History   Socioeconomic History  . Marital status: Married    Spouse name: Herbie Baltimore  . Number of children: 2  . Years of education: Not on file  . Highest education level: Not on file  Occupational History  . Not on file  Tobacco Use  . Smoking status: Never Smoker  . Smokeless tobacco: Never Used  Substance and Sexual Activity  . Alcohol use: Yes    Comment: Social drinker  . Drug use: No  . Sexual activity: Not Currently  Other Topics Concern  . 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  Newly married as of October 2020.   2 boys, age 46 and 7   Social Determinants of Health   Financial Resource Strain:   . Difficulty of Paying Living Expenses: Not on file  Food Insecurity:   . Worried About Charity fundraiser in the Last Year: Not on file  . Ran Out of Food in the Last Year: Not on file  Transportation Needs:   . Lack of Transportation (Medical): Not on file  . Lack of Transportation (Non-Medical): Not on file  Physical Activity:   . Days of Exercise per Week: Not on file  . Minutes of Exercise per Session: Not on file  Stress:   . Feeling of Stress : Not on file  Social Connections:   . Frequency of Communication with Friends and Family: Not on file  . Frequency  of Social Gatherings with Friends and Family: Not on file  . Attends Religious Services: Not on file  . Active Member of Clubs or Organizations: Not on file  . Attends Archivist Meetings: Not on file  . Marital Status: Not on file     FAMILY HISTORY:  We obtained a detailed, 4-generation family history.  Significant diagnoses are listed below: Family History  Problem Relation Age of Onset  . Colon cancer Father 11  . Prostate cancer Father 1  . Colon cancer Paternal Aunt        dx. early 42s  . Ovarian cancer Paternal Grandmother 33  . Breast cancer Cousin   . Endometriosis Mother   . Cancer Paternal Uncle        unknown type, dx. early 7s  . Cancer Paternal Uncle 74       unknown type   Ms. Buda has two sons, ages 40 and 64. She has one full brother who is 33 and one paternal half-brother who is 7. None of these individuals, nor any of their children, have  had cancer.  Ms. Veazey mother is currently 75 and has not had cancer. She has four maternal uncles (one of whom died in his mid to late 36s) and four maternal aunts (one of whom died by drowning at age 74). Her maternal grandmother died around age 61, and her maternal grandfather died around age 28. One of her aunt's great-granddaughter died from an unknown type of cancer at age 70 or 58. There are no other known diagnoses of cancer on the maternal side of the family.  Ms. Gutterman father is currently 73 and has a history of colon cancer diagnosed at age 50 and prostate cancer at age 46. Both cancers were treated with surgery. She has five paternal aunts and eight paternal uncles. One of her aunts died from colon cancer diagnosed in her early 16s, one of her uncles died from an unknown type of cancer diagnosed in his early 24s, and another uncle may have an unknown type of cancer now, as he is receiving chemotherapy. Ms. Gaddie paternal grandmother died from ovarian cancer around the age of 59, and  her paternal grandfather died in his 16s. She also has a paternal first cousin who died from breast cancer in her early 71s. There are no other known diagnoses of cancer on the paternal side of the family.  Ms. Daley is unaware of previous family history of genetic testing for hereditary cancer risks. Patient's maternal ancestors are of [place] American descent, and paternal ancestors are of [place] American and Native American descent. There is no reported Ashkenazi Jewish ancestry. There is no known consanguinity.  GENETIC COUNSELING ASSESSMENT: Ms. Marszalek is a 44 y.o. female with a personal and family history of ovarian cancer, and a family history of colon, prostate, and breast cancer, which is somewhat suggestive of a hereditary cancer syndrome and predisposition to cancer. We, therefore, discussed and recommended the following at today's visit.   DISCUSSION: We discussed that 15-20% of ovarian cancer is hereditary.  The majority of hereditary ovarian cancer is associated with high grade serous histology.  For individuals such as Ms. Skolnik who have clear cell ovarian cancer, germline pathogenic variants have been identified at a lower rate.  Genes known to increase the risk for ovarian cancer include BRCA1/2, MLH1, MSH2, MSH6, PMS2, EPCAM, BRIP1, RAD51C, and RAD51D.   We discussed that testing is beneficial for several reasons, including identifying whether potential treatment options such as PARP inhibitors would be beneficial, knowing about other cancer risks, identifying potential screening and risk-reduction options that may be appropriate, and to understand if other family members could be at risk for cancer and allow them to undergo genetic testing.     We reviewed the characteristics, features and inheritance patterns of hereditary cancer syndromes.  We discussed genetic testing, including the appropriate family members to test, the process of testing, insurance coverage and  turn-around-time for results.  We also reviewed tumor testing for homologous recombination deficiency (HRD).  We discussed that genetic testing on her blood will test for hereditary genetic changes that could explain her diagnosis of cancer.  HRD testing is genetic testing performed on the tumor that can determine somatic genetic changes that could influence her management.  HRD testing is performed in tandem with genetic testing, and typically at no additional cost.    We discussed the implications of a negative, positive and/or variant of uncertain significant result. We recommended Ms. Breithaupt pursue genetic testing for the [employer] gene panel.   The TumorNext-HRD+CancerNext test offered  by [facility] includes paired germline and tumor analyses of 11 genes associated with homologous recombination repair (ATM, BARD1, BRIP1, CHEK2, MRE11A, NBN, PALB2, RAD51C, RAD51D, BRCA1, BRCA2) plus germline analyses of 26 additional genes associated with hereditary cancer (APC, AXIN2, BMPR1A, CDH1, CDK4, CDKN2A, DICER1, HOXB13, EPCAM, GREM1, MLH1, MSH2, MSH3, MSH6, MUTYH, NF1, NTHL1, PMS2, POLD1, POLE, PTEN, RECQL, SMAD4, SMARCA4, STK11, and TP53)   Based on Ms. Tsuchiya's personal and family history of cancer, she meets medical criteria for genetic testing. Despite that she meets criteria, she may still have an out of pocket cost. We discussed that if her out of pocket cost for testing is over $100, the laboratory will call and confirm whether she wants to proceed with testing.  If the out of pocket cost of testing is less than $100 she will be billed by the genetic testing laboratory.    PLAN: After considering the risks, benefits, and limitations, Ms. Coakley provided informed consent to pursue genetic testing and the blood sample was sent to [facility] for analysis of the [employer]. Results should be available within approximately three-four weeks' time, at which  point they will be disclosed by telephone to Ms. Teeple, as will any additional recommendations warranted by these results. Ms. Hatton will receive a summary of her genetic counseling visit and a copy of her results once available. This information will also be available in Epic.    Ms. Sinor questions were answered to her satisfaction today. Our contact information was provided should additional questions or concerns arise. Thank you for the referral and allowing [place] to share in the care of your patient.   Clint Guy, MS, [facility] Certified Genetic Counselor [email or web address] Phone: [phone]  The patient was seen for a total of 60 minutes in face-to-face genetic counseling.  This patient was discussed with Drs. Magrinat, Lindi Adie and/or Burr Medico who agrees with the above.    _______________________________________________________________________ For Office Staff:  Number of people involved in session: 1 Was an Intern/ student involved with case: no

## 2019-08-10 ENCOUNTER — Telehealth: Payer: Self-pay | Admitting: Oncology

## 2019-08-10 NOTE — Progress Notes (Signed)
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 There is a 16 cm complex cystic mass in the pelvis containing thickened septations located near midline, abutting the superior aspect of the uterus. I suspect this mass is adnexal/ovarian in origin rather than uterine in origin. Specifically, I suspect the mass likely arises from the left ovary/adnexa and is most consistent with a neoplasm. Malignancy is certainly not excluded on this study. Recommend a pelvic ultrasound for further evaluation. 2. The rounded more solid-appearing structure in the right side of the pelvis is probably the right ovary. Recommend attention on ultrasound. 3. The small amount of fluid in the pelvis is likely reactive to the complex cystic mass likely rising from the left ovary/adnexa. 4. No cause for blood in vomit or black stools identified. No convincing evidence of a perforated or inflamed gastric or duodenal ulcer. 5. Hepatic steatosis.   07/02/2019 Imaging   MRI pelvis 1. 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 There are 2 enhancing nodules within the central upper abdomen raising the possibility of peritoneal carcinomatosis. Additionally, there is a small amount fluid within the pelvis. Malignant ascites not excluded. 2. Fibroid uterus. 3. Hepatic steatosis.   07/02/2019 Imaging   US pelvis 1. There is a large mass in the pelvis also seen on CT imaging. A separate left ovary is not visualized. 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 The mass is suspicious for malignancy. Recommend gynecologic consultation. 2. The right ovary  demonstrates an irregular ill-defined hypoechoic hypervascular region centrally. The findings are nonspecific in the right ovary. However, given the apparent left ovarian neoplasm suspicious for malignancy, recommend an MRI to further assess the right ovary. 3. Uterine fibroid measuring 3.6 cm.     07/21/2019 Pathology Results   FINAL MICROSCOPIC DIAGNOSIS: A. 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 - See oncology table. B. OMENTUM, RESECTION: - Omental lymph node with metastatic carcinoma (1/1). - Omental adipose tissue with foci of inflammation and reactive mesothelial changes. C. FALLOPIAN TUBE, LEFT, RESECTION: - Fallopian tube with focal, mild inflammation and fibrosis. - No tumor identified. D. UTERUS, CERVIX, RIGHT TUBE AND OVARY: - Cervix Nabothian cyst and squamous metaplasia. - Endometrium Proliferative. No hyperplasia or carcinoma. - Myometrium Leiomyomata. No malignancy. -Right ovary Poorly differentiated carcinoma, 4.8 cm. See oncology table and comment. -Right Fallopian tube Benign paratubal cyst. No endometriosis or malignancy. ONCOLOGY TABLE: OVARY or FALLOPIAN TUBE or PRIMARY PERITONEUM: Procedure: Bilateral f-oophorectomy, hysterectomy and omentectomy. Specimen Integrity: Intact Tumor Site: Left ovary and right ovary. See comment. Ovarian Surface Involvement: Not identified. Fallopian Tube Surface Involvement: Not identified. Tumor Size: Left ovary: 18.6 x 16.0 x 7.2 cm. Right ovary: 4.8 x 3.2 x 3.2 cm. Histologic Type: Clear cell carcinoma. See comment. Histologic Grade: High-grade. Other Tissue/ Organ Involvement: Omental lymph node with metastatic carcinoma. Peritoneal/Ascitic Fluid: Pending. Treatment Effect: No known presurgical therapy. Pathologic Stage Classification (pTNM, AJCC 8th Edition): pT3a, pN1a. Representative Tumor Block: A2, A3, A4, A5 A6, A7 and A8. Comment(s): The left ovarian tumor is 18.6 cm  in greatest dimension and has features of clear cell carcinoma. The  right ovarian tumor is 4.8 cm in greatest dimension and is poorly differentiated with focal features consistent with clear cell carcinoma. The pattern of involvement suggests that the right ovarian tumor is likely metastatic from the larger left ovarian clear cell carcinoma. Sections of the omentum show a lymph node with metastatic carcinoma. Sections of the remainder of the omentum do not show involvement by carcinoma.   07/21/2019 Surgery   Procedure(s) Performed: Exploratory laparotomy with radical tumor debulking including total hysterectomy, bilateral salpingo-oophorectomy, infra-gastric omentectomy, and washings.   Surgeon: Jeral Pinch, MD    Operative Findings: On EUA, mobile uterus and ovarian mass, situated out of the pelvis, spanning 6-8 cm above the umbilicus.  On intra-abdominal entry, inflamed omentum adherent to much of the 16 cm left ovarian mass, adhesions easily lysed bluntly.  No nodularity of the ovarian mass however on delivery of the mass through the midline incision, there was Intra-Op rupture of one of the smaller cystic components with drainage of clear brown-tinged fluid.  Grossly normal-appearing left fallopian tube as well as right tube.  Right ovary mildly enlarged measuring approximately 4 cm and firm/nodular, suspicious for tumor involvement.  Uterus 8-10 cm with 4 cm anterior mid body fibroid.  Some very small volume miliary disease along right pelvic sidewall, removed with uterine specimen.  Evidence of small diverticular disease.  Small bowel normal appearing although multiple loops of bowel adherent to each other, especially by mesentery, and an inflammatory manner.  An approximately 2 x 2 centimeter nodule suspicious for tumor implant was found in the omentum just inferior to the greater curvature of the stomach.  Additional small (less than 5 mm) implants noted on the stomach and posterior aspect of  the left lobe of the liver.  Otherwise the liver surface and diaphragm were smooth.   08/03/2019 Cancer Staging   Staging form: Ovary, Fallopian Tube, and Primary Peritoneal Carcinoma, AJCC 8th Edition - Pathologic: Stage IIIA2 (pT3a, pN1, cM0) - Signed by Heath Lark, MD on 08/03/2019   08/12/2019 Procedure   Placement of single lumen port a cath via right internal jugular vein. The catheter tip lies at the cavo-atrial junction. A power injectable port a cath was placed and is ready for immediate use.     Interval History: Patient is overall doing very well from a postoperative perspective.  She met with Dr. Alvy Bimler on 12/30 and with genetic counselor on 1/5. She had her port put in this morning and is having some pain around the insertion site.  She is scheduled to start chemotherapy on the 18th and is coming next Monday for chemotherapy teaching.  Since surgery, she has had frequent hot flashes daily that are short-lived but intense.  She reports that her appetite is improving although she does not eat as much still as she used to.  She denies any nausea or emesis.  She is using Senokot and now MiraLAX to help keep her bowel function regular.  She denies any urinary symptoms.  She has not had any vaginal bleeding or discharge since surgery.  Past Medical/Surgical History: Past Medical History:  Diagnosis Date  . Anemia   . Asthma   . Family history of breast cancer   . Family history of colon cancer   . Family history of ovarian cancer   . Family history of prostate cancer   . Pelvic mass   . Ulcer, stomach peptic     Past Surgical History:  Procedure Laterality Date  . CHOLECYSTECTOMY N/A 02/16/2015  Procedure: LAPAROSCOPIC CHOLECYSTECTOMY WITH INTRAOPERATIVE CHOLANGIOGRAM;  Surgeon: Johnathan Hausen, MD;  Location: [facility];  Service: General;  Laterality: N/A;  . IR IMAGING GUIDED PORT INSERTION  08/12/2019  . SALPINGOOPHORECTOMY N/A 07/21/2019   Procedure: EXPLORATORY LAPAROTOMY,  TOTAL ABDOMINAL HYSTERECTOMY, BILATERAL SALPINGO OOPHORECTOMY, OMENTECTOMY;  Surgeon: Lafonda Mosses, MD;  Location: [facility];  Service: Gynecology;  Laterality: N/A;    Family History  Problem Relation Age of Onset  . Colon cancer Father 72  . Prostate cancer Father 47  . Colon cancer Paternal Aunt        dx. early 50s  . Ovarian cancer Paternal Grandmother 81  . Breast cancer Cousin   . Endometriosis Mother   . Cancer Paternal Uncle        unknown type, dx. early 31s  . Cancer Paternal Uncle 92       unknown type    Social History   Socioeconomic History  . Marital status: Married    Spouse name: Herbie Baltimore  . Number of children: 2  . Years of education: Not on file  . Highest education level: Not on file  Occupational History  . Not on file  Tobacco Use  . Smoking status: Never Smoker  . Smokeless tobacco: Never Used  Substance and Sexual Activity  . Alcohol use: Yes    Comment: Social drinker  . Drug use: No  . Sexual activity: Not Currently  Other Topics Concern  . 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  Newly married as of October 2020.   2 boys, age 66 and 32   Social Determinants of Health   Financial Resource Strain:   . Difficulty of Paying Living Expenses: Not on file  Food Insecurity:   . Worried About Charity fundraiser in the Last Year: Not on file  . Ran Out of Food in the Last Year: Not on file  Transportation Needs:   . Lack of Transportation (Medical): Not on file  . Lack of Transportation (Non-Medical): Not on file  Physical Activity:   . Days of Exercise per Week: Not on file  . Minutes of Exercise per Session: Not on file  Stress:   . Feeling of Stress : Not on file  Social Connections:   . Frequency of Communication with Friends and Family: Not on file  . Frequency of Social Gatherings with Friends and Family: Not on file  . Attends Religious Services: Not on file  . Active Member of Clubs  or Organizations: Not on file  . Attends Archivist Meetings: Not on file  . Marital Status: Not on file    Current Medications:  Current Outpatient Medications:  .  acetaminophen (TYLENOL) 500 MG tablet, Take 1,000 mg by mouth every 6 (six) hours as needed for moderate pain. , Disp: , Rfl:  .  enoxaparin (LOVENOX) 40 MG/0.4ML injection, Inject 0.4 mLs (40 mg total) into the skin daily for 25 doses., Disp: 10 mL, Rfl: 0 .  famotidine (PEPCID) 20 MG tablet, Take 1 tablet (20 mg total) by mouth 2 (two) times daily., Disp: 30 tablet, Rfl: 0 .  Multiple Vitamin (MULTIVITAMIN WITH MINERALS) TABS tablet, Take 1 tablet by mouth daily., Disp: , Rfl:  .  omeprazole (PRILOSEC) 20 MG capsule, Take 40 mg by mouth daily., Disp: , Rfl:  .  senna-docusate (SENOKOT-S) 8.6-50 MG tablet, Take 2 tablets by mouth at bedtime. For AFTER surgery, do  not take if having diarrhea, Disp: 60 tablet, Rfl: 1 .  albuterol (PROVENTIL HFA;VENTOLIN HFA) 108 (90 Base) MCG/ACT inhaler, Inhale 2 puffs into the lungs every 4 (four) hours as needed for wheezing or shortness of breath., Disp: , Rfl:  .  dexamethasone (DECADRON) 4 MG tablet, Take 2 tabs at the night before and 2 tabs the morning of chemotherapy, every 3 weeks, by mouth, please dispense 24 tabs for total 6 cycles of treatment (Patient not taking: Reported on 08/12/2019), Disp: 24 tablet, Rfl: 5 .  estradiol (CLIMARA) 0.05 mg/24hr patch, Place 1 patch (0.05 mg total) onto the skin once a week., Disp: 4 patch, Rfl: 12 .  ibuprofen (ADVIL) 800 MG tablet, Take 1 tablet (800 mg total) by mouth every 8 (eight) hours as needed for moderate pain. For AFTER surgery only (Patient not taking: Reported on 08/12/2019), Disp: 30 tablet, Rfl: 1 .  lidocaine-prilocaine (EMLA) cream, Apply to affected area once (Patient not taking: Reported on 08/12/2019), Disp: 30 g, Rfl: 3 .  ondansetron (ZOFRAN) 8 MG tablet, Take 1 tablet (8 mg total) by mouth every 8 (eight) hours as needed for  refractory nausea / vomiting. (Patient not taking: Reported on 08/12/2019), Disp: 30 tablet, Rfl: 1 .  oxyCODONE (OXY IR/ROXICODONE) 5 MG immediate release tablet, Take 1 tablet (5 mg total) by mouth every 4 (four) hours as needed for severe pain. Do not take and drive (Patient not taking: Reported on 08/12/2019), Disp: 20 tablet, Rfl: 0 .  prochlorperazine (COMPAZINE) 10 MG tablet, Take 1 tablet (10 mg total) by mouth every 6 (six) hours as needed (Nausea or vomiting). (Patient not taking: Reported on 08/12/2019), Disp: 30 tablet, Rfl: 1 No current facility-administered medications for this visit.  Facility-Administered Medications Ordered in Other Visits:  .  0.9 %  sodium chloride infusion, , Intravenous, Continuous, Docia Barrier, PA, Stopped at 08/12/19 1250 .  fentaNYL (SUBLIMAZE) 100 MCG/2ML injection, , , ,  .  heparin lock flush 100 UNIT/ML injection, , , ,  .  lidocaine (XYLOCAINE) 1 % (with pres) injection, , , ,  .  lidocaine (XYLOCAINE) 1 % (with pres) injection, , , ,  .  midazolam (VERSED) 2 MG/2ML injection, , , ,   Review of Symptoms: + hot flashes, appetite changes Denies fevers, chills, fatigue, unexplained weight changes. Denies hearing loss, neck lumps or masses, mouth sores, ringing in ears or voice changes. Denies cough or wheezing.  Denies shortness of breath. Denies chest pain or palpitations. Denies leg swelling. Denies abdominal distention, pain, blood in stools, constipation, diarrhea, nausea, vomiting, or early satiety. Denies pain with intercourse, dysuria, frequency, hematuria or incontinence. Denies pelvic pain, vaginal bleeding or vaginal discharge.   Denies joint pain, back pain or muscle pain/cramps. Denies itching, rash, or wounds. Denies dizziness, headaches, numbness or seizures. Denies swollen lymph nodes or glands, denies easy bruising or bleeding. Denies anxiety, depression, confusion, or decreased concentration.   Physical Exam: BP (!)  148/106 (BP Location: Left Arm, Patient Position: Sitting) Comment: RN notified  Pulse 77   Temp 98 F (36.7 C) (Temporal)   Resp 18   Ht '5\' 2"'  (1.575 m)   Wt 220 lb (99.8 kg)   LMP 07/05/2019   SpO2 100%   BMI 40.24 kg/m  General: Alert, oriented, no acute distress. HEENT: Traumatic, normocephalic, sclera anicteric. Chest: Clear to auscultation bilaterally.  No wheezes, rhonchi, or rales Cardiovascular: Regular rate and rhythm, no murmurs. Abdomen: Obese, soft, nontender.  Normoactive bowel sounds.  No masses or hepatosplenomegaly appreciated.  Well-healed scar, no erythema, induration, or exudate.  Significant reduction in the bulge at the superior aspect of the incision, likely resolving hematoma versus seroma.  Extremities: Grossly normal range of motion.  Warm, well perfused.  No edema bilaterally. Skin: No rashes or lesions noted. Lymphatics: No cervical, supraclavicular, or inguinal adenopathy. GU: Normal appearing external genitalia without erythema, excoriation, or lesions.  Speculum exam reveals cuff intact, no discharge or bleeding.  Bimanual exam reveals cuff intact, no tenderness or fluctuance.  No nodularity.  Laboratory & Radiologic Studies: None new  Assessment & Plan: Sarah Newman is a 44 y.o. woman with Stage IIIA2  Clear cell ovarian cancer, status post R1 resection, who presents for postoperative follow-up.  Patient is overall doing very well from surgery.  She had her port placed today.  She is scheduled to have chemotherapy teaching early next week and her first cycle of platinum/taxane chemotherapy on the 18th.  We discussed differential diagnosis for the bulge at the superior aspect of her incision.  She had felt something at the inferior aspect although both have significantly reduced in size.  A think it is much less likely that the the area at the superior aspect of her incision is a hernia and more likely to be a resolving hematoma or seroma.  We will be  able to reassess at the time of her next CT scan.  The patient has met with genetics and is going to have genetic testing done.  I will see her again after she completes adjuvant chemotherapy or sooner if the need arises.  We will plan to have every 9-monthvisits initially after completion of treatment.  In terms of her significant and bothersome menopausal symptoms, we discussed the use of hormone replacement therapy.  I think this could be with estrogen alone in the form of an estrogen patch.  We discussed the increased risk of VTE with the use of estrogen.  I think given her surgical menopause with removal of both ovaries, there is likely to be significant change in her VTE risk due to estrogen levels.  We also reviewed that in terms of her risk of VTE given her cancer diagnosis and about to start treatment, she is at higher risk with a Khorana score of 4 due to having a gynecologic malignancy, platelets over 350,000, hemoglobin less than 10 and BMI over 35.  Gives her a 6.7-7.1% risk of VTE at 2.5 months.  We discussed the importance of continuing to be active and mobile, which the patient is at baseline and with her work (she plans to return to work in 3 weeks).  We also discussed signs and symptoms of a DVT that should prompt a call.  KJeral Pinch MD  Division of Gynecologic Oncology  Department of Obstetrics and Gynecology  [facility]

## 2019-08-10 NOTE — Telephone Encounter (Signed)
Called Sarah Newman to see how she is doing.  She said she is having trouble sleeping due to the hematomas on the top and bottom of the incision.  She said they are getting smaller but ache and are sore. She hasn't taken pain medication for about a week and said the ibuprofen makes her sweat.  She does take tylenol occasionally.  She also said she is having frequent hot flashes that started after surgery.     She is scheduled to have her port inserted on Friday and is wondering if she can reschedule the patient education class.  Rescheduled the class to Monday, 08/15/19 at 12 pm.

## 2019-08-11 ENCOUNTER — Other Ambulatory Visit: Payer: Self-pay | Admitting: Student

## 2019-08-11 ENCOUNTER — Other Ambulatory Visit: Payer: Self-pay | Admitting: Radiology

## 2019-08-12 ENCOUNTER — Encounter: Payer: Self-pay | Admitting: Gynecologic Oncology

## 2019-08-12 ENCOUNTER — Ambulatory Visit (HOSPITAL_COMMUNITY)
Admission: RE | Admit: 2019-08-12 | Discharge: 2019-08-12 | Disposition: A | Discharge status: Home / Self Care | Payer: BC Managed Care – PPO | Source: Ambulatory Visit | Attending: Hematology and Oncology | Admitting: Hematology and Oncology

## 2019-08-12 ENCOUNTER — Inpatient Hospital Stay: Payer: BC Managed Care – PPO

## 2019-08-12 ENCOUNTER — Other Ambulatory Visit: Payer: Self-pay

## 2019-08-12 ENCOUNTER — Encounter (HOSPITAL_COMMUNITY): Payer: Self-pay

## 2019-08-12 ENCOUNTER — Inpatient Hospital Stay (HOSPITAL_BASED_OUTPATIENT_CLINIC_OR_DEPARTMENT_OTHER): Payer: BC Managed Care – PPO | Admitting: Gynecologic Oncology

## 2019-08-12 VITALS — BP 148/106 | HR 77 | Temp 98.0°F | Resp 18 | Ht 62.0 in | Wt 220.0 lb

## 2019-08-12 DIAGNOSIS — C562 Malignant neoplasm of left ovary: Secondary | ICD-10-CM

## 2019-08-12 DIAGNOSIS — Z90722 Acquired absence of ovaries, bilateral: Secondary | ICD-10-CM

## 2019-08-12 DIAGNOSIS — E8940 Asymptomatic postprocedural ovarian failure: Secondary | ICD-10-CM

## 2019-08-12 DIAGNOSIS — Z90710 Acquired absence of both cervix and uterus: Secondary | ICD-10-CM

## 2019-08-12 HISTORY — PX: IR IMAGING GUIDED PORT INSERTION: IMG5740

## 2019-08-12 MED ORDER — MIDAZOLAM HCL 2 MG/2ML IJ SOLN
INTRAMUSCULAR | Status: AC | PRN
  Administered 2019-08-12: 1 mg via INTRAVENOUS

## 2019-08-12 MED ORDER — CEFAZOLIN SODIUM-DEXTROSE 2-4 GM/100ML-% IV SOLN
INTRAVENOUS | Status: AC
  Administered 2019-08-12: 2000 mg
  Filled 2019-08-12: qty 100

## 2019-08-12 MED ORDER — SODIUM CHLORIDE 0.9 % IV SOLN: INTRAVENOUS | Status: DC

## 2019-08-12 MED ORDER — FENTANYL CITRATE (PF) 100 MCG/2ML IJ SOLN
INTRAMUSCULAR | Status: AC | PRN
  Administered 2019-08-12: 50 ug via INTRAVENOUS

## 2019-08-12 MED ORDER — HEPARIN SOD (PORK) LOCK FLUSH 100 UNIT/ML IV SOLN
INTRAVENOUS | Status: AC | PRN
  Administered 2019-08-12: 500 [IU] via INTRAVENOUS

## 2019-08-12 MED ORDER — LIDOCAINE-EPINEPHRINE 1 %-1:100000 IJ SOLN
INTRAMUSCULAR | Status: AC | PRN
  Administered 2019-08-12: 10 mL

## 2019-08-12 MED ORDER — ESTRADIOL 0.05 MG/24HR TD PTWK: 0.0500 mg | MEDICATED_PATCH | TRANSDERMAL | 12 refills | Status: DC

## 2019-08-12 MED ORDER — LIDOCAINE HCL 1 % IJ SOLN
INTRAMUSCULAR | Status: AC
  Filled 2019-08-12: qty 20

## 2019-08-12 MED ORDER — FENTANYL CITRATE (PF) 100 MCG/2ML IJ SOLN
INTRAMUSCULAR | Status: AC
  Filled 2019-08-12: qty 2

## 2019-08-12 MED ORDER — MIDAZOLAM HCL 2 MG/2ML IJ SOLN
INTRAMUSCULAR | Status: AC
  Filled 2019-08-12: qty 4

## 2019-08-12 MED ORDER — HEPARIN SOD (PORK) LOCK FLUSH 100 UNIT/ML IV SOLN
INTRAVENOUS | Status: AC
  Filled 2019-08-12: qty 5

## 2019-08-12 MED ORDER — LIDOCAINE HCL (PF) 1 % IJ SOLN
INTRAMUSCULAR | Status: AC | PRN
  Administered 2019-08-12: 5 mL

## 2019-08-12 MED FILL — ESTRADIOL 0.05 MG/DAY PATCH: 0.05 | 28 days supply | Qty: 4 | Fill #0

## 2019-08-12 NOTE — Consult Note (Signed)
Chief Complaint: Patient was seen in consultation today for Port-A-Cath placement  Referring Physician(s): Spangle  Supervising Physician: Aletta Edouard  Patient Status: [place]  History of Present Illness: Sarah Newman is a 44 y.o. female with history of newly diagnosed ovarian cancer, status post surgery on 07/21/2019.  She presents today for Port-A-Cath placement for chemotherapy.  Past Medical History:  Diagnosis Date  . Anemia   . Asthma   . Family history of breast cancer   . Family history of colon cancer   . Family history of ovarian cancer   . Family history of prostate cancer   . Pelvic mass   . Ulcer, stomach peptic     Past Surgical History:  Procedure Laterality Date  . CHOLECYSTECTOMY N/A 02/16/2015   Procedure: LAPAROSCOPIC CHOLECYSTECTOMY WITH INTRAOPERATIVE CHOLANGIOGRAM;  Surgeon: Johnathan Hausen, MD;  Location: [facility];  Service: General;  Laterality: N/A;  . SALPINGOOPHORECTOMY N/A 07/21/2019   Procedure: EXPLORATORY LAPAROTOMY, TOTAL ABDOMINAL HYSTERECTOMY, BILATERAL SALPINGO OOPHORECTOMY, OMENTECTOMY;  Surgeon: Lafonda Mosses, MD;  Location: [facility];  Service: Gynecology;  Laterality: N/A;    Allergies: Patient has no known allergies.  Medications: Prior to Admission medications   Medication Sig Start Date End Date Taking? Authorizing Provider  acetaminophen (TYLENOL) 500 MG tablet Take 1,000 mg by mouth every 6 (six) hours as needed for moderate pain.    Yes [provider]  ibuprofen (ADVIL) 800 MG tablet Take 1 tablet (800 mg total) by mouth every 8 (eight) hours as needed for moderate pain. For AFTER surgery only 07/11/19  Yes Cross, Melissa D, NP  oxyCODONE (OXY IR/ROXICODONE) 5 MG immediate release tablet Take 1 tablet (5 mg total) by mouth every 4 (four) hours as needed for severe pain. Do not take and drive [id number]  Yes Cross, Melissa D, NP  senna-docusate (SENOKOT-S) 8.6-50 MG tablet Take 2 tablets by mouth at  bedtime. For AFTER surgery, do not take if having diarrhea 07/11/19  Yes Cross, Melissa D, NP  albuterol (PROVENTIL HFA;VENTOLIN HFA) 108 (90 Base) MCG/ACT inhaler Inhale 2 puffs into the lungs every 4 (four) hours as needed for wheezing or shortness of breath.    [provider]  dexamethasone (DECADRON) 4 MG tablet Take 2 tabs at the night before and 2 tabs the morning of chemotherapy, every 3 weeks, by mouth, please dispense 24 tabs for total 6 cycles of treatment 08/03/19   Heath Lark, MD  enoxaparin (LOVENOX) 40 MG/0.4ML injection Inject 0.4 mLs (40 mg total) into the skin daily for 25 doses. 07/24/19 08/18/19  Joylene John D, NP  famotidine (PEPCID) 20 MG tablet Take 1 tablet (20 mg total) by mouth 2 (two) times daily. 07/02/19   Frederica Kuster, PA-C  lidocaine-prilocaine (EMLA) cream Apply to affected area once 08/03/19   Heath Lark, MD  Multiple Vitamin (MULTIVITAMIN WITH MINERALS) TABS tablet Take 1 tablet by mouth daily.    [provider]  omeprazole (PRILOSEC) 20 MG capsule Take 40 mg by mouth daily.    [provider]  ondansetron (ZOFRAN) 8 MG tablet Take 1 tablet (8 mg total) by mouth every 8 (eight) hours as needed for refractory nausea / vomiting. 08/03/19   Heath Lark, MD  prochlorperazine (COMPAZINE) 10 MG tablet Take 1 tablet (10 mg total) by mouth every 6 (six) hours as needed (Nausea or vomiting). 08/03/19   Heath Lark, MD     Family History  Problem Relation Age of Onset  . Colon cancer Father  86  . Prostate cancer Father 30  . Colon cancer Paternal Aunt        dx. early 31s  . Ovarian cancer Paternal Grandmother 74  . Breast cancer Cousin   . Endometriosis Mother   . Cancer Paternal Uncle        unknown type, dx. early 74s  . Cancer Paternal Uncle 52       unknown type    Social History   Socioeconomic History  . Marital status: Married    Spouse name: Herbie Baltimore  . Number of children: 2  . Years of education: Not on file  .  Highest education level: Not on file  Occupational History  . Not on file  Tobacco Use  . Smoking status: Never Smoker  . Smokeless tobacco: Never Used  Substance and Sexual Activity  . Alcohol use: Yes    Comment: Social drinker  . Drug use: No  . Sexual activity: Not Currently  Other Topics Concern  . 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  Newly married as of October 2020.   2 boys, age 6 and 55   Social Determinants of Health   Financial Resource Strain:   . Difficulty of Paying Living Expenses: Not on file  Food Insecurity:   . Worried About Charity fundraiser in the Last Year: Not on file  . Ran Out of Food in the Last Year: Not on file  Transportation Needs:   . Lack of Transportation (Medical): Not on file  . Lack of Transportation (Non-Medical): Not on file  Physical Activity:   . Days of Exercise per Week: Not on file  . Minutes of Exercise per Session: Not on file  Stress:   . Feeling of Stress : Not on file  Social Connections:   . Frequency of Communication with Friends and Family: Not on file  . Frequency of Social Gatherings with Friends and Family: Not on file  . Attends Religious Services: Not on file  . Active Member of Clubs or Organizations: Not on file  . Attends Archivist Meetings: Not on file  . Marital Status: Not on file      Review of Systems currently denies fever, headache, chest pain, dyspnea, cough, back pain, nausea, vomiting or bleeding.  She does have some mild abdominal soreness from recent surgery  Vital Signs: Blood pressure 121/95, heart rate 76, temp 98.1, respiration 18, O2 sat 96% room air LMP 07/05/2019   Physical Exam awake, alert.  Chest with distant but clear breath sounds bilaterally.  Heart with regular rate and rhythm.  Abdomen obese, soft, positive bowel sounds, mildly tender mid abdominal region at surgical wound site.  No significant lower extremity  edema.  Imaging: No results found.  Labs:  CBC: Recent Labs    07/22/19 0359 07/23/19 0451 07/24/19 1414 07/25/19 0412  WBC 15.4* 9.8 8.1 7.5  HGB 10.2* 9.5* 9.8* 9.1*  HCT 32.1* 30.8* 31.1* 28.6*  PLT 508* 478* 492* 451*    COAGS: No results for input(s): INR, APTT in the last 8760 hours.  BMP: Recent Labs    07/22/19 0359 07/23/19 0451 07/24/19 1105 07/25/19 0412  NA 137 139 139 139  K 4.5 4.0 3.8 3.6  CL 103 104 103 106  CO2 25 28 28 25   GLUCOSE 200* 142* 112* 132*  BUN 6 6 6  <5*  CALCIUM 8.3* 8.4* 8.7* 8.5*  CREATININE 0.60  0.77 0.70 0.62  GFRNONAA >60 >60 >60 >60  GFRAA >60 >60 >60 >60    LIVER FUNCTION TESTS: Recent Labs    07/02/19 1206 07/15/19 1130 07/24/19 1105  BILITOT 0.5 1.1 0.6  AST 76* 93* 77*  ALT 73* 77* 56*  ALKPHOS 98 107 90  PROT 7.5 8.1 6.7  ALBUMIN 3.8 4.0 3.0*    TUMOR MARKERS: No results for input(s): AFPTM, CEA, CA199, CHROMGRNA in the last 8760 hours.  Assessment and Plan:  44 y.o. female with history of newly diagnosed ovarian cancer, status post surgery on 07/21/2019.  She presents today for Port-A-Cath placement for chemotherapy.Risks and benefits of image guided port-a-catheter placement was discussed with the patient including, but not limited to bleeding, infection, pneumothorax, or fibrin sheath development and need for additional procedures.  All of the patient's questions were answered, patient is agreeable to proceed. Consent signed and in chart.     Thank you for this interesting consult.  I greatly enjoyed meeting Royann Goth and look forward to participating in their care.  A copy of this report was sent to the requesting provider on this date.  Electronically Signed: D. Rowe Robert, PA-C 08/12/2019, 11:17 AM   I spent a total of 20 minutes   in face to face in clinical consultation, greater than 50% of which was counseling/coordinating care for Port-A-Cath placement

## 2019-08-12 NOTE — Discharge Instructions (Signed)
DO NOT APPLY ANY EMLA CREAM OR LOTIONS OVER THE PORT SITE FOR THE NEXT  2 WEEKS ;  For any questions or concerns call [phone]; for after hours call [phone] and ask for on call MD   Implanted Port Insertion, Care After This sheet gives you information about how to care for yourself after your procedure. Your health care provider may also give you more specific instructions. If you have problems or questions, contact your health care provider. What can I expect after the procedure? After the procedure, it is common to have:  Discomfort at the port insertion site.  Bruising on the skin over the port. This should improve over 3-4 days. Follow these instructions at home: [facility] care  After your port is placed, you will get a manufacturer's information card. The card has information about your port. Keep this card with you at all times.  Take care of the port as told by your health care provider. Ask your health care provider if you or a family member can get training for taking care of the port at home. A home health care nurse may also take care of the port.  Make sure to remember what type of port you have. Incision care      Follow instructions from your health care provider about how to take care of your port insertion site. Make sure you: ? Wash your hands with soap and water before and after you change your bandage (dressing). If soap and water are not available, use hand sanitizer. ? Change your dressing as told by your health care provider. ? Leave stitches (sutures), skin glue, or adhesive strips in place. These skin closures may need to stay in place for 2 weeks or longer. If adhesive strip edges start to loosen and curl up, you may trim the loose edges. Do not remove adhesive strips completely unless your health care provider tells you to do that.  Check your port insertion site every day for signs of infection. Check for: ? Redness, swelling, or pain. ? Fluid or  blood. ? Warmth. ? Pus or a bad smell. Activity  Return to your normal activities as told by your health care provider. Ask your health care provider what activities are safe for you.  Do not lift anything that is heavier than 10 lb (4.5 kg), or the limit that you are told, until your health care provider says that it is safe. General instructions  Take over-the-counter and prescription medicines only as told by your health care provider.  Do not take baths, swim, or use a hot tub until your health care provider approves. Ask your health care provider if you may take showers. You may only be allowed to take sponge baths.  Do not drive for 24 hours if you were given a sedative during your procedure.  Wear a medical alert bracelet in case of an emergency. This will tell any health care providers that you have a port.  Keep all follow-up visits as told by your health care provider. This is important. Contact a health care provider if:  You cannot flush your port with saline as directed, or you cannot draw blood from the port.  You have a fever or chills.  You have redness, swelling, or pain around your port insertion site.  You have fluid or blood coming from your port insertion site.  Your port insertion site feels warm to the touch.  You have pus or a bad smell coming from the  port insertion site. Get help right away if:  You have chest pain or shortness of breath.  You have bleeding from your port that you cannot control. Summary  Take care of the port as told by your health care provider. Keep the manufacturer's information card with you at all times.  Change your dressing as told by your health care provider.  Contact a health care provider if you have a fever or chills or if you have redness, swelling, or pain around your port insertion site.  Keep all follow-up visits as told by your health care provider. This information is not intended to replace advice given to  you by your health care provider. Make sure you discuss any questions you have with your health care provider. Document Revised: 02/16/2018 Document Reviewed: 02/16/2018 Elsevier Patient Education  [place].   Moderate Conscious Sedation, Adult, Care After These instructions provide you with information about caring for yourself after your procedure. Your health care provider may also give you more specific instructions. Your treatment has been planned according to current medical practices, but problems sometimes occur. Call your health care provider if you have any problems or questions after your procedure. What can I expect after the procedure? After your procedure, it is common:  To feel sleepy for several hours.  To feel clumsy and have poor balance for several hours.  To have poor judgment for several hours.  To vomit if you eat too soon. Follow these instructions at home: For at least 24 hours after the procedure:   Do not: ? Participate in activities where you could fall or become injured. ? Drive. ? Use heavy machinery. ? Drink alcohol. ? Take sleeping pills or medicines that cause drowsiness. ? Make important decisions or sign legal documents. ? Take care of children on your own.  Rest. Eating and drinking  Follow the diet recommended by your health care provider.  If you vomit: ? Drink water, juice, or soup when you can drink without vomiting. ? Make sure you have little or no nausea before eating solid foods. General instructions  Have a responsible adult stay with you until you are awake and alert.  Take over-the-counter and prescription medicines only as told by your health care provider.  If you smoke, do not smoke without supervision.  Keep all follow-up visits as told by your health care provider. This is important. Contact a health care provider if:  You keep feeling nauseous or you keep vomiting.  You feel light-headed.  You develop a  rash.  You have a fever. Get help right away if:  You have trouble breathing. This information is not intended to replace advice given to you by your health care provider. Make sure you discuss any questions you have with your health care provider. Document Revised: 07/03/2017 Document Reviewed: 11/10/2015 Elsevier Patient Education  2020 [employer].

## 2019-08-12 NOTE — Procedures (Signed)
Interventional Radiology Procedure Note  Procedure: Single Lumen Power Port Placement    Access:  Right IJ vein.  Findings: Catheter tip positioned at SVC/RA junction. Port is ready for immediate use.   Complications: None  EBL: < 10 mL  Recommendations:  - Ok to shower in 24 hours - Do not submerge for 7 days - Routine line care   Ulanda Tackett T. Solara Goodchild, M.D Pager:  [phone]   

## 2019-08-12 NOTE — Patient Instructions (Signed)
Great to see you! Healing nicely after surgery. Restrictions remain for another 3 weeks. Plan to follow up as scheduled with Dr. Alvy Bimler. I will see you back after you finish chemotherapy.

## 2019-08-15 ENCOUNTER — Encounter: Payer: Self-pay | Admitting: Hematology and Oncology

## 2019-08-15 ENCOUNTER — Inpatient Hospital Stay: Payer: BC Managed Care – PPO

## 2019-08-15 ENCOUNTER — Other Ambulatory Visit: Payer: Self-pay

## 2019-08-15 NOTE — Progress Notes (Signed)
Called pt to introduce myself as her Arboriculturist and to discuss copay assistance.  Pt gave me consent to apply in her behalf so I applied to the [employer] [place] and she was approved for $4000 for 12 months from 08/15/19.  I also informed her of the [employer] and went over what it covers.  Pt would like to apply so she will bring her proof of income on 08/22/19.  If approved I will give her an expense sheet and I will also give her my card at her next visit for any questions or concerns she may have in the future.

## 2019-08-17 ENCOUNTER — Telehealth: Payer: Self-pay | Admitting: *Deleted

## 2019-08-17 NOTE — Telephone Encounter (Signed)
Per request, faxed office note 1/8 to [facility]

## 2019-08-22 ENCOUNTER — Inpatient Hospital Stay: Payer: BC Managed Care – PPO

## 2019-08-22 ENCOUNTER — Other Ambulatory Visit: Payer: Self-pay | Admitting: Hematology and Oncology

## 2019-08-22 ENCOUNTER — Inpatient Hospital Stay: Payer: BC Managed Care – PPO | Admitting: *Deleted

## 2019-08-22 ENCOUNTER — Encounter: Payer: Self-pay | Admitting: *Deleted

## 2019-08-22 ENCOUNTER — Other Ambulatory Visit: Payer: Self-pay

## 2019-08-22 ENCOUNTER — Encounter: Payer: Self-pay | Admitting: Oncology

## 2019-08-22 VITALS — BP 132/90 | HR 81 | Temp 97.4°F | Resp 20

## 2019-08-22 DIAGNOSIS — Z90722 Acquired absence of ovaries, bilateral: Secondary | ICD-10-CM | POA: Diagnosis not present

## 2019-08-22 DIAGNOSIS — Z006 Encounter for examination for normal comparison and control in clinical research program: Secondary | ICD-10-CM | POA: Diagnosis present

## 2019-08-22 DIAGNOSIS — Z5111 Encounter for antineoplastic chemotherapy: Secondary | ICD-10-CM | POA: Diagnosis not present

## 2019-08-22 DIAGNOSIS — C561 Malignant neoplasm of right ovary: Secondary | ICD-10-CM | POA: Diagnosis present

## 2019-08-22 DIAGNOSIS — C562 Malignant neoplasm of left ovary: Secondary | ICD-10-CM

## 2019-08-22 DIAGNOSIS — Z90710 Acquired absence of both cervix and uterus: Secondary | ICD-10-CM | POA: Diagnosis not present

## 2019-08-22 LAB — CBC WITH DIFFERENTIAL (CANCER CENTER ONLY)
Abs Immature Granulocytes: 0.04 10*3/uL (ref 0.00–0.07)
Basophils Absolute: 0 10*3/uL (ref 0.0–0.1)
Basophils Relative: 0 %
Eosinophils Absolute: 0 10*3/uL (ref 0.0–0.5)
Eosinophils Relative: 0 %
HCT: 35.4 % — ABNORMAL LOW (ref 36.0–46.0)
Hemoglobin: 11.4 g/dL — ABNORMAL LOW (ref 12.0–15.0)
Immature Granulocytes: 1 %
Lymphocytes Relative: 12 %
Lymphs Abs: 0.8 10*3/uL (ref 0.7–4.0)
MCH: 27.9 pg (ref 26.0–34.0)
MCHC: 32.2 g/dL (ref 30.0–36.0)
MCV: 86.8 fL (ref 80.0–100.0)
Monocytes Absolute: 0.1 10*3/uL (ref 0.1–1.0)
Monocytes Relative: 1 %
Neutro Abs: 6 10*3/uL (ref 1.7–7.7)
Neutrophils Relative %: 86 %
Platelet Count: 328 10*3/uL (ref 150–400)
RBC: 4.08 MIL/uL (ref 3.87–5.11)
RDW: 13.2 % (ref 11.5–15.5)
WBC Count: 6.9 10*3/uL (ref 4.0–10.5)
nRBC: 0 % (ref 0.0–0.2)

## 2019-08-22 LAB — CMP (CANCER CENTER ONLY)
ALT: 71 U/L — ABNORMAL HIGH (ref 0–44)
AST: 47 U/L — ABNORMAL HIGH (ref 15–41)
Albumin: 4.1 g/dL (ref 3.5–5.0)
Alkaline Phosphatase: 109 U/L (ref 38–126)
Anion gap: 10 (ref 5–15)
BUN: 8 mg/dL (ref 6–20)
CO2: 22 mmol/L (ref 22–32)
Calcium: 9.5 mg/dL (ref 8.9–10.3)
Chloride: 104 mmol/L (ref 98–111)
Creatinine: 0.78 mg/dL (ref 0.44–1.00)
GFR, Est AFR Am: >60 mL/min (ref >60)
GFR, Estimated: >60 mL/min (ref >60)
Glucose, Bld: 266 mg/dL — ABNORMAL HIGH (ref 70–99)
Potassium: 4 mmol/L (ref 3.5–5.1)
Sodium: 136 mmol/L (ref 135–145)
Total Bilirubin: 0.3 mg/dL (ref 0.3–1.2)
Total Protein: 8 g/dL (ref 6.5–8.1)

## 2019-08-22 LAB — RESEARCH LABS

## 2019-08-22 MED ORDER — DEXAMETHASONE SODIUM PHOSPHATE 10 MG/ML IJ SOLN
INTRAMUSCULAR | Status: AC
  Filled 2019-08-22: qty 1

## 2019-08-22 MED ORDER — FAMOTIDINE IN NACL 20-0.9 MG/50ML-% IV SOLN
INTRAVENOUS | Status: AC
  Filled 2019-08-22: qty 50

## 2019-08-22 MED ORDER — SODIUM CHLORIDE 0.9% FLUSH
10.0000 mL | INTRAVENOUS | Status: DC | PRN
  Administered 2019-08-22: 19:00:00 10 mL
  Filled 2019-08-22: qty 10

## 2019-08-22 MED ORDER — SODIUM CHLORIDE 0.9% FLUSH
10.0000 mL | Freq: Once | INTRAVENOUS | Status: AC
  Administered 2019-08-22: 10 mL
  Filled 2019-08-22: qty 10

## 2019-08-22 MED ORDER — SODIUM CHLORIDE 0.9 % IV SOLN
750.0000 mg | Freq: Once | INTRAVENOUS | Status: AC
  Administered 2019-08-22: 18:00:00 750 mg via INTRAVENOUS
  Filled 2019-08-22: qty 75

## 2019-08-22 MED ORDER — DIPHENHYDRAMINE HCL 50 MG/ML IJ SOLN
INTRAMUSCULAR | Status: AC
  Filled 2019-08-22: qty 1

## 2019-08-22 MED ORDER — FAMOTIDINE IN NACL 20-0.9 MG/50ML-% IV SOLN
20.0000 mg | Freq: Once | INTRAVENOUS | Status: AC
  Administered 2019-08-22: 20 mg via INTRAVENOUS

## 2019-08-22 MED ORDER — SODIUM CHLORIDE 0.9 % IV SOLN
175.0000 mg/m2 | Freq: Once | INTRAVENOUS | Status: AC
  Administered 2019-08-22: 366 mg via INTRAVENOUS
  Filled 2019-08-22: qty 61

## 2019-08-22 MED ORDER — DEXAMETHASONE SODIUM PHOSPHATE 10 MG/ML IJ SOLN
10.0000 mg | Freq: Once | INTRAMUSCULAR | Status: AC
  Administered 2019-08-22: 10 mg via INTRAVENOUS

## 2019-08-22 MED ORDER — INSULIN REGULAR HUMAN 100 UNIT/ML IJ SOLN
10.0000 [IU] | Freq: Once | INTRAMUSCULAR | Status: AC
  Administered 2019-08-22: 13:00:00 10 [IU] via SUBCUTANEOUS
  Filled 2019-08-22: qty 10

## 2019-08-22 MED ORDER — PALONOSETRON HCL INJECTION 0.25 MG/5ML
0.2500 mg | Freq: Once | INTRAVENOUS | Status: AC
  Administered 2019-08-22: 0.25 mg via INTRAVENOUS

## 2019-08-22 MED ORDER — DIPHENHYDRAMINE HCL 50 MG/ML IJ SOLN
50.0000 mg | Freq: Once | INTRAMUSCULAR | Status: AC
  Administered 2019-08-22: 13:00:00 50 mg via INTRAVENOUS

## 2019-08-22 MED ORDER — PALONOSETRON HCL INJECTION 0.25 MG/5ML
INTRAVENOUS | Status: AC
  Filled 2019-08-22: qty 5

## 2019-08-22 MED ORDER — SODIUM CHLORIDE 0.9 % IV SOLN
Freq: Once | INTRAVENOUS | Status: AC
  Filled 2019-08-22: qty 250

## 2019-08-22 MED ORDER — HEPARIN SOD (PORK) LOCK FLUSH 100 UNIT/ML IV SOLN
500.0000 [IU] | Freq: Once | INTRAVENOUS | Status: AC | PRN
  Administered 2019-08-22: 500 [IU]
  Filled 2019-08-22: qty 5

## 2019-08-22 MED ORDER — SODIUM CHLORIDE 0.9 % IV SOLN
150.0000 mg | Freq: Once | INTRAVENOUS | Status: AC
  Administered 2019-08-22: 13:00:00 150 mg via INTRAVENOUS
  Filled 2019-08-22: qty 5

## 2019-08-22 NOTE — Progress Notes (Signed)
Met with Sarah Newman during her first chemotherapy infusion.  Encouraged her to call with any questions or needs.

## 2019-08-22 NOTE — Patient Instructions (Signed)
[facility] Discharge Instructions for Patients Receiving Chemotherapy  Today you received the following chemotherapy agents Paclitaxel and Carboplatin  To help prevent nausea and vomiting after your treatment, we encourage you to take your nausea medication as prescribed.   If you develop nausea and vomiting that is not controlled by your nausea medication, call the clinic.   BELOW ARE SYMPTOMS THAT SHOULD BE REPORTED IMMEDIATELY:  *FEVER GREATER THAN 100.5 F  *CHILLS WITH OR WITHOUT FEVER  NAUSEA AND VOMITING THAT IS NOT CONTROLLED WITH YOUR NAUSEA MEDICATION  *UNUSUAL SHORTNESS OF BREATH  *UNUSUAL BRUISING OR BLEEDING  TENDERNESS IN MOUTH AND THROAT WITH OR WITHOUT PRESENCE OF ULCERS  *URINARY PROBLEMS  *BOWEL PROBLEMS  UNUSUAL RASH Items with * indicate a potential emergency and should be followed up as soon as possible.  Feel free to call the clinic should you have any questions or concerns. The clinic phone number is [phone].  Please show the CHEMO ALERT CARD at check-in to the Emergency Department and triage nurse.  Paclitaxel injection What is this medicine? PACLITAXEL (PAK li TAX el) is a chemotherapy drug. It targets fast dividing cells, like cancer cells, and causes these cells to die. This medicine is used to treat ovarian cancer, breast cancer, lung cancer, Kaposi's sarcoma, and other cancers. This medicine may be used for other purposes; ask your health care provider or pharmacist if you have questions. COMMON BRAND NAME(S): Onxol, Taxol What should I tell my health care provider before I take this medicine? They need to know if you have any of these conditions:  history of irregular heartbeat  liver disease  low blood counts, like low white cell, platelet, or red cell counts  lung or breathing disease, like asthma  tingling of the fingers or toes, or other nerve disorder  an unusual or allergic reaction to paclitaxel, alcohol,  polyoxyethylated castor oil, other chemotherapy, other medicines, foods, dyes, or preservatives  pregnant or trying to get pregnant  breast-feeding How should I use this medicine? This drug is given as an infusion into a vein. It is administered in a hospital or clinic by a specially trained health care professional. Talk to your pediatrician regarding the use of this medicine in children. Special care may be needed. Overdosage: If you think you have taken too much of this medicine contact a poison control center or emergency room at once. NOTE: This medicine is only for you. Do not share this medicine with others. What if I miss a dose? It is important not to miss your dose. Call your doctor or health care professional if you are unable to keep an appointment. What may interact with this medicine? Do not take this medicine with any of the following medications:  disulfiram  metronidazole This medicine may also interact with the following medications:  antiviral medicines for hepatitis, HIV or AIDS  certain antibiotics like erythromycin and clarithromycin  certain medicines for fungal infections like ketoconazole and itraconazole  certain medicines for seizures like carbamazepine, phenobarbital, phenytoin  gemfibrozil  nefazodone  rifampin  St. John's wort This list may not describe all possible interactions. Give your health care provider a list of all the medicines, herbs, non-prescription drugs, or dietary supplements you use. Also tell them if you smoke, drink alcohol, or use illegal drugs. Some items may interact with your medicine. What should I watch for while using this medicine? Your condition will be monitored carefully while you are receiving this medicine. You will need important   blood work done while you are taking this medicine. This medicine can cause serious allergic reactions. To reduce your risk you will need to take other medicine(s) before treatment with this  medicine. If you experience allergic reactions like skin rash, itching or hives, swelling of the face, lips, or tongue, tell your doctor or health care professional right away. In some cases, you may be given additional medicines to help with side effects. Follow all directions for their use. This drug may make you feel generally unwell. This is not uncommon, as chemotherapy can affect healthy cells as well as cancer cells. Report any side effects. Continue your course of treatment even though you feel ill unless your doctor tells you to stop. Call your doctor or health care professional for advice if you get a fever, chills or sore throat, or other symptoms of a cold or flu. Do not treat yourself. This drug decreases your body's ability to fight infections. Try to avoid being around people who are sick. This medicine may increase your risk to bruise or bleed. Call your doctor or health care professional if you notice any unusual bleeding. Be careful brushing and flossing your teeth or using a toothpick because you may get an infection or bleed more easily. If you have any dental work done, tell your dentist you are receiving this medicine. Avoid taking products that contain aspirin, acetaminophen, ibuprofen, naproxen, or ketoprofen unless instructed by your doctor. These medicines may hide a fever. Do not become pregnant while taking this medicine. Women should inform their doctor if they wish to become pregnant or think they might be pregnant. There is a potential for serious side effects to an unborn child. Talk to your health care professional or pharmacist for more information. Do not breast-feed an infant while taking this medicine. Men are advised not to father a child while receiving this medicine. This product may contain alcohol. Ask your pharmacist or healthcare provider if this medicine contains alcohol. Be sure to tell all healthcare providers you are taking this medicine. Certain medicines,  like metronidazole and disulfiram, can cause an unpleasant reaction when taken with alcohol. The reaction includes flushing, headache, nausea, vomiting, sweating, and increased thirst. The reaction can last from 30 minutes to several hours. What side effects may I notice from receiving this medicine? Side effects that you should report to your doctor or health care professional as soon as possible:  allergic reactions like skin rash, itching or hives, swelling of the face, lips, or tongue  breathing problems  changes in vision  fast, irregular heartbeat  high or low blood pressure  mouth sores  pain, tingling, numbness in the hands or feet  signs of decreased platelets or bleeding - bruising, pinpoint red spots on the skin, black, tarry stools, blood in the urine  signs of decreased red blood cells - unusually weak or tired, feeling faint or lightheaded, falls  signs of infection - fever or chills, cough, sore throat, pain or difficulty passing urine  signs and symptoms of liver injury like dark yellow or brown urine; general ill feeling or flu-like symptoms; light-colored stools; loss of appetite; nausea; right upper belly pain; unusually weak or tired; yellowing of the eyes or skin  swelling of the ankles, feet, hands  unusually slow heartbeat Side effects that usually do not require medical attention (report to your doctor or health care professional if they continue or are bothersome):  diarrhea  hair loss  loss of appetite  muscle or joint   pain  nausea, vomiting  pain, redness, or irritation at site where injected  tiredness This list may not describe all possible side effects. Call your doctor for medical advice about side effects. You may report side effects to FDA at 1-800-FDA-1088. Where should I keep my medicine? This drug is given in a hospital or clinic and will not be stored at home. NOTE: This sheet is a summary. It may not cover all possible information.  If you have questions about this medicine, talk to your doctor, pharmacist, or health care provider.  2020 Elsevier/Gold Standard (2017-03-24 13:14:55)  Carboplatin injection What is this medicine? CARBOPLATIN (KAR boe pla tin) is a chemotherapy drug. It targets fast dividing cells, like cancer cells, and causes these cells to die. This medicine is used to treat ovarian cancer and many other cancers. This medicine may be used for other purposes; ask your health care provider or pharmacist if you have questions. COMMON BRAND NAME(S): Paraplatin What should I tell my health care provider before I take this medicine? They need to know if you have any of these conditions:  blood disorders  hearing problems  kidney disease  recent or ongoing radiation therapy  an unusual or allergic reaction to carboplatin, cisplatin, other chemotherapy, other medicines, foods, dyes, or preservatives  pregnant or trying to get pregnant  breast-feeding How should I use this medicine? This drug is usually given as an infusion into a vein. It is administered in a hospital or clinic by a specially trained health care professional. Talk to your pediatrician regarding the use of this medicine in children. Special care may be needed. Overdosage: If you think you have taken too much of this medicine contact a poison control center or emergency room at once. NOTE: This medicine is only for you. Do not share this medicine with others. What if I miss a dose? It is important not to miss a dose. Call your doctor or health care professional if you are unable to keep an appointment. What may interact with this medicine?  medicines for seizures  medicines to increase blood counts like filgrastim, pegfilgrastim, sargramostim  some antibiotics like amikacin, gentamicin, neomycin, streptomycin, tobramycin  vaccines Talk to your doctor or health care professional before taking any of these  medicines:  acetaminophen  aspirin  ibuprofen  ketoprofen  naproxen This list may not describe all possible interactions. Give your health care provider a list of all the medicines, herbs, non-prescription drugs, or dietary supplements you use. Also tell them if you smoke, drink alcohol, or use illegal drugs. Some items may interact with your medicine. What should I watch for while using this medicine? Your condition will be monitored carefully while you are receiving this medicine. You will need important blood work done while you are taking this medicine. This drug may make you feel generally unwell. This is not uncommon, as chemotherapy can affect healthy cells as well as cancer cells. Report any side effects. Continue your course of treatment even though you feel ill unless your doctor tells you to stop. In some cases, you may be given additional medicines to help with side effects. Follow all directions for their use. Call your doctor or health care professional for advice if you get a fever, chills or sore throat, or other symptoms of a cold or flu. Do not treat yourself. This drug decreases your body's ability to fight infections. Try to avoid being around people who are sick. This medicine may increase your risk to bruise   or bleed. Call your doctor or health care professional if you notice any unusual bleeding. Be careful brushing and flossing your teeth or using a toothpick because you may get an infection or bleed more easily. If you have any dental work done, tell your dentist you are receiving this medicine. Avoid taking products that contain aspirin, acetaminophen, ibuprofen, naproxen, or ketoprofen unless instructed by your doctor. These medicines may hide a fever. Do not become pregnant while taking this medicine. Women should inform their doctor if they wish to become pregnant or think they might be pregnant. There is a potential for serious side effects to an unborn child. Talk  to your health care professional or pharmacist for more information. Do not breast-feed an infant while taking this medicine. What side effects may I notice from receiving this medicine? Side effects that you should report to your doctor or health care professional as soon as possible:  allergic reactions like skin rash, itching or hives, swelling of the face, lips, or tongue  signs of infection - fever or chills, cough, sore throat, pain or difficulty passing urine  signs of decreased platelets or bleeding - bruising, pinpoint red spots on the skin, black, tarry stools, nosebleeds  signs of decreased red blood cells - unusually weak or tired, fainting spells, lightheadedness  breathing problems  changes in hearing  changes in vision  chest pain  high blood pressure  low blood counts - This drug may decrease the number of white blood cells, red blood cells and platelets. You may be at increased risk for infections and bleeding.  nausea and vomiting  pain, swelling, redness or irritation at the injection site  pain, tingling, numbness in the hands or feet  problems with balance, talking, walking  trouble passing urine or change in the amount of urine Side effects that usually do not require medical attention (report to your doctor or health care professional if they continue or are bothersome):  hair loss  loss of appetite  metallic taste in the mouth or changes in taste This list may not describe all possible side effects. Call your doctor for medical advice about side effects. You may report side effects to FDA at 1-800-FDA-1088. Where should I keep my medicine? This drug is given in a hospital or clinic and will not be stored at home. NOTE: This sheet is a summary. It may not cover all possible information. If you have questions about this medicine, talk to your doctor, pharmacist, or health care provider.  2020 Elsevier/Gold Standard (2007-10-26  14:38:05)  Coronavirus (COVID-19) Are you at risk?  Are you at risk for the Coronavirus (COVID-19)?  To be considered HIGH RISK for Coronavirus (COVID-19), you have to meet the following criteria:  . Traveled to [place], [place], [place], [place] or [place]; or in the [place] to [place], [place], [place], or [place]; and have fever, cough, and shortness of breath within the last 2 weeks of travel OR . Been in close contact with a person diagnosed with COVID-19 within the last 2 weeks and have fever, cough, and shortness of breath . IF YOU DO NOT MEET THESE CRITERIA, YOU ARE CONSIDERED LOW RISK FOR COVID-19.  What to do if you are HIGH RISK for COVID-19?  . If you are having a medical emergency, call 911. . Seek medical care right away. Before you go to a doctor's office, urgent care or emergency department, call ahead and tell them about your recent travel, contact with   someone diagnosed with COVID-19, and your symptoms. You should receive instructions from your physician's office regarding next steps of care.  . When you arrive at healthcare provider, tell the healthcare staff immediately you have returned from visiting [place], [place], [place], [place] or [place]; or traveled in the [place] to [place], [place], [place], or [place]; in the last two weeks or you have been in close contact with a person diagnosed with COVID-19 in the last 2 weeks.   . Tell the health care staff about your symptoms: fever, cough and shortness of breath. . After you have been seen by a medical provider, you will be either: o Tested for (COVID-19) and discharged home on quarantine except to seek medical care if symptoms worsen, and asked to  - Stay home and avoid contact with others until you get your results (4-5 days)  - Avoid travel on public transportation if possible (such as bus, train, or airplane) or o Sent to the Emergency Department by EMS for evaluation, COVID-19 testing, and  possible admission depending on your condition and test results.  What to do if you are LOW RISK for COVID-19?  Reduce your risk of any infection by using the same precautions used for avoiding the common cold or flu:  . Wash your hands often with soap and warm water for at least 20 seconds.  If soap and water are not readily available, use an alcohol-based hand sanitizer with at least 60% alcohol.  . If coughing or sneezing, cover your mouth and nose by coughing or sneezing into the elbow areas of your shirt or coat, into a tissue or into your sleeve (not your hands). . Avoid shaking hands with others and consider head nods or verbal greetings only. . Avoid touching your eyes, nose, or mouth with unwashed hands.  . Avoid close contact with people who are sick. . Avoid places or events with large numbers of people in one location, like concerts or sporting events. . Carefully consider travel plans you have or are making. . If you are planning any travel outside or inside the [place], visit the CDC's Travelers' Health webpage for the latest health notices. . If you have some symptoms but not all symptoms, continue to monitor at home and seek medical attention if your symptoms worsen. . If you are having a medical emergency, call 911.   ADDITIONAL HEALTHCARE OPTIONS FOR PATIENTS  [facility] / e-Visit: [email or web address]         [facility]: [phone]  [facility]: [phone]                   [facility]: [phone]      

## 2019-08-22 NOTE — Patient Instructions (Signed)

## 2019-08-22 NOTE — Research (Signed)
08/22/2019 at 10:58am- [id number] consent visit/registration/baseline activities-  The research nurse met with the pt upon her arrival to the [facility] this morning.  The pt stated that she had read the consent form and hipaa form, and she wanted to participate in the "neuropathy" study.  The pt had no questions or concerns about study participation.  Therefore, the pt signed the consent form and hipaa form at 10:10am this morning before any study procedures were performed.  The pt was given a copy of her signed forms for her records.  The pt was given her baseline questionnaires to complete.  The pt completed her questionnaires, and the research nurse reviewed them for accuracy and completion.  Doreatha Martin, research nurse manager, confirmed that the pt met all of the eligibility criteria (section 5.0) for study enrollment.  Dr. Alvy Bimler also reviewed the pt's eligibility and reviewed/signed the pt's [id number] Registration Worksheet confirming the pt meets all criteria in section 5.0 of the most current protocol. The pt was successfully registered to the study, and was given the PID of [id number].The research nurse met with the pt along with Otilio Miu, RN to conduct the pt's baseline neuropathy testing (neuropen and tuning fork assessments). The pt completed the "Timed Get Up and Go" test. The pt also denied any history of falls within the past 6 months. The pt confirmed that she is right-handed and that she would kick a ball with her right foot.  Dr. Lindi Adie completed the pt's baseline neuropathy forms for the study. Thept confirmed that she has never smoked. She deniedall ofthe healthconditions mentioned on the Onstudy Form. The pt denied currently taking the following medications: gabapentin, narcotics, tramadol, duloxetine, and ibuprofen. The pt said that she currently takes Tylenol 500 mg 2-3 pills per week. The pt reports taking  [place],  multivitamin tablet daily, and applies an estrogen patch.   The pt  reports that her "One-A-Day" vitamin has vitamin E and B6.  The pt was encouraged to bring in her multivitamin bottle for dosage verification.  The pt verbalized understanding.  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 The pt denied any current interventions for neuropathy. The pt's pre-treatment serum creatinine level was WNL. The pt's baseline research blood was drawn this morning.  Brion Aliment RN, BSN, CCRP Clinical Research Nurse 08/22/2019 11:01 AM   08/22/2019 at 4:41pm- The pt's taxol administration was not started at 1:15pm as planned.  Due to the delayed taxol infusion start time,  the pt's last 10 minutes of taxane research blood draw was going to occur after 5 pm.  A staff member volunteered to draw the pt's blood during the last 10 minutes of the pt's infusion per protocol, but there were no lab [facility] available to stay past 5 pm to process the blood.  The study was contacted, and they advised the site to not draw the blood sample if the sample could not be processed on the same day as the blood was drawn.  Therefore, the pt's mandatory and optional whole blood collection during the last 10 minutes of the first taxane infusion was not drawn.  The pt was informed why her sample will not be obtained as previously planned.  Doreatha Martin, Geophysicist/field seismologist, was made aware of this issue.  The research nurse completed a deviation form documenting why this sample was not collected.   Brion Aliment RN, BSN, CCRP Clinical Research Nurse 08/22/2019 4:59 PM   08/25/2019 at 2:59pm -  Vernetta Honey, regulatory coordinator, notified the research nurse on 08/24/2019 that the deviation form was sent to the [facility] for reporting purposes. Brion Aliment RN, BSN, CCRP Clinical Research Nurse 08/25/2019 3:00 PM

## 2019-08-23 ENCOUNTER — Telehealth: Payer: Self-pay | Admitting: Emergency Medicine

## 2019-08-23 NOTE — Telephone Encounter (Signed)
-----   Message from Rolene Course, RN sent at 08/22/2019  4:01 PM EST ----- Regarding: Sarah Newman 1st Tx F/U call - paclitaxel/carboplatin [place] 1st Tx F/U call - Paclitaxel & carboplatin

## 2019-08-23 NOTE — Telephone Encounter (Signed)
Chemo f/u call, no answer.  LVM requesting pt to call back with any questions or concerns after recent first time infusion with call back number.

## 2019-08-24 ENCOUNTER — Encounter: Payer: Self-pay | Admitting: Gynecologic Oncology

## 2019-08-24 ENCOUNTER — Telehealth: Payer: Self-pay | Admitting: Oncology

## 2019-08-24 NOTE — Telephone Encounter (Signed)
Informed Sarah Newman that Dr. Alvy Bimler usually recommends that patients are off work for 6 months while getting chemotherapy because of side effects including fatigue and nausea.  She does understand if she needs to return to work if she does not qualify for FMLA or disability.  Sarah Newman said she does not have FMLA or disability through her work because she had just started in August.  She would still like to return to work on 08/29/19.  Advised her that we will call her when the letter is ready.

## 2019-08-26 ENCOUNTER — Telehealth: Payer: Self-pay | Admitting: *Deleted

## 2019-08-26 NOTE — Telephone Encounter (Signed)
Patient calling for a refill of Oxycodone to [facility].

## 2019-08-29 ENCOUNTER — Other Ambulatory Visit: Payer: Self-pay | Admitting: Hematology and Oncology

## 2019-08-29 DIAGNOSIS — R1900 Intra-abdominal and pelvic swelling, mass and lump, unspecified site: Secondary | ICD-10-CM

## 2019-08-29 MED ORDER — OXYCODONE HCL 5 MG PO TABS: 5.0000 mg | ORAL_TABLET | ORAL | 0 refills | Status: DC | PRN

## 2019-08-29 NOTE — Telephone Encounter (Signed)
I refilled only a small amount I typically do not refill long term for post-op pain

## 2019-09-06 ENCOUNTER — Encounter: Payer: Self-pay | Admitting: Hematology and Oncology

## 2019-09-06 NOTE — Progress Notes (Signed)
Pt provided proof of income but she exceeds the income requirement so unfortunately she doesn't qualify for the grant at this time.

## 2019-09-09 ENCOUNTER — Other Ambulatory Visit: Payer: Self-pay | Admitting: Hematology and Oncology

## 2019-09-12 ENCOUNTER — Inpatient Hospital Stay: Payer: BC Managed Care – PPO | Attending: Gynecologic Oncology

## 2019-09-12 ENCOUNTER — Inpatient Hospital Stay: Payer: BC Managed Care – PPO

## 2019-09-12 ENCOUNTER — Inpatient Hospital Stay (HOSPITAL_BASED_OUTPATIENT_CLINIC_OR_DEPARTMENT_OTHER): Payer: BC Managed Care – PPO | Admitting: Hematology and Oncology

## 2019-09-12 ENCOUNTER — Other Ambulatory Visit: Payer: Self-pay

## 2019-09-12 ENCOUNTER — Encounter: Payer: Self-pay | Admitting: Medical Oncology

## 2019-09-12 DIAGNOSIS — R748 Abnormal levels of other serum enzymes: Secondary | ICD-10-CM

## 2019-09-12 DIAGNOSIS — T451X5A Adverse effect of antineoplastic and immunosuppressive drugs, initial encounter: Secondary | ICD-10-CM

## 2019-09-12 DIAGNOSIS — C562 Malignant neoplasm of left ovary: Secondary | ICD-10-CM

## 2019-09-12 DIAGNOSIS — Z79899 Other long term (current) drug therapy: Secondary | ICD-10-CM | POA: Insufficient documentation

## 2019-09-12 DIAGNOSIS — Z006 Encounter for examination for normal comparison and control in clinical research program: Secondary | ICD-10-CM | POA: Insufficient documentation

## 2019-09-12 DIAGNOSIS — G620 Drug-induced polyneuropathy: Secondary | ICD-10-CM | POA: Insufficient documentation

## 2019-09-12 DIAGNOSIS — Z5111 Encounter for antineoplastic chemotherapy: Secondary | ICD-10-CM | POA: Diagnosis not present

## 2019-09-12 DIAGNOSIS — C775 Secondary and unspecified malignant neoplasm of intrapelvic lymph nodes: Secondary | ICD-10-CM | POA: Insufficient documentation

## 2019-09-12 DIAGNOSIS — G2581 Restless legs syndrome: Secondary | ICD-10-CM | POA: Diagnosis not present

## 2019-09-12 DIAGNOSIS — K760 Fatty (change of) liver, not elsewhere classified: Secondary | ICD-10-CM | POA: Insufficient documentation

## 2019-09-12 DIAGNOSIS — M898X9 Other specified disorders of bone, unspecified site: Secondary | ICD-10-CM

## 2019-09-12 LAB — CMP (CANCER CENTER ONLY)
ALT: 88 U/L — ABNORMAL HIGH (ref 0–44)
AST: 65 U/L — ABNORMAL HIGH (ref 15–41)
Albumin: 3.7 g/dL (ref 3.5–5.0)
Alkaline Phosphatase: 136 U/L — ABNORMAL HIGH (ref 38–126)
Anion gap: 8 (ref 5–15)
BUN: 10 mg/dL (ref 6–20)
CO2: 26 mmol/L (ref 22–32)
Calcium: 8.9 mg/dL (ref 8.9–10.3)
Chloride: 107 mmol/L (ref 98–111)
Creatinine: 0.67 mg/dL (ref 0.44–1.00)
GFR, Est AFR Am: >60 mL/min (ref >60)
GFR, Estimated: >60 mL/min (ref >60)
Glucose, Bld: 119 mg/dL — ABNORMAL HIGH (ref 70–99)
Potassium: 3.8 mmol/L (ref 3.5–5.1)
Sodium: 141 mmol/L (ref 135–145)
Total Bilirubin: <0.2 mg/dL — ABNORMAL LOW (ref 0.3–1.2)
Total Protein: 7.4 g/dL (ref 6.5–8.1)

## 2019-09-12 LAB — CBC WITH DIFFERENTIAL (CANCER CENTER ONLY)
Abs Immature Granulocytes: 0.1 10*3/uL — ABNORMAL HIGH (ref 0.00–0.07)
Basophils Absolute: 0 10*3/uL (ref 0.0–0.1)
Basophils Relative: 0 %
Eosinophils Absolute: 0.1 10*3/uL (ref 0.0–0.5)
Eosinophils Relative: 2 %
HCT: 33.7 % — ABNORMAL LOW (ref 36.0–46.0)
Hemoglobin: 10.7 g/dL — ABNORMAL LOW (ref 12.0–15.0)
Immature Granulocytes: 1 %
Lymphocytes Relative: 24 %
Lymphs Abs: 1.8 10*3/uL (ref 0.7–4.0)
MCH: 27.6 pg (ref 26.0–34.0)
MCHC: 31.8 g/dL (ref 30.0–36.0)
MCV: 87.1 fL (ref 80.0–100.0)
Monocytes Absolute: 0.5 10*3/uL (ref 0.1–1.0)
Monocytes Relative: 7 %
Neutro Abs: 4.9 10*3/uL (ref 1.7–7.7)
Neutrophils Relative %: 66 %
Platelet Count: 298 10*3/uL (ref 150–400)
RBC: 3.87 MIL/uL (ref 3.87–5.11)
RDW: 13.2 % (ref 11.5–15.5)
WBC Count: 7.5 10*3/uL (ref 4.0–10.5)
nRBC: 0 % (ref 0.0–0.2)

## 2019-09-12 LAB — RESEARCH LABS

## 2019-09-12 MED ORDER — SODIUM CHLORIDE 0.9 % IV SOLN
750.0000 mg | Freq: Once | INTRAVENOUS | Status: AC
  Administered 2019-09-12: 750 mg via INTRAVENOUS
  Filled 2019-09-12: qty 75

## 2019-09-12 MED ORDER — SODIUM CHLORIDE 0.9 % IV SOLN
Freq: Once | INTRAVENOUS | Status: AC
  Filled 2019-09-12: qty 250

## 2019-09-12 MED ORDER — HEPARIN SOD (PORK) LOCK FLUSH 100 UNIT/ML IV SOLN
500.0000 [IU] | Freq: Once | INTRAVENOUS | Status: AC | PRN
  Administered 2019-09-12: 500 [IU]
  Filled 2019-09-12: qty 5

## 2019-09-12 MED ORDER — DEXAMETHASONE SODIUM PHOSPHATE 10 MG/ML IJ SOLN
INTRAMUSCULAR | Status: AC
  Filled 2019-09-12: qty 1

## 2019-09-12 MED ORDER — FAMOTIDINE IN NACL 20-0.9 MG/50ML-% IV SOLN
INTRAVENOUS | Status: AC
  Filled 2019-09-12: qty 50

## 2019-09-12 MED ORDER — SODIUM CHLORIDE 0.9% FLUSH
10.0000 mL | INTRAVENOUS | Status: DC | PRN
  Administered 2019-09-12: 10 mL
  Filled 2019-09-12: qty 10

## 2019-09-12 MED ORDER — DEXAMETHASONE SODIUM PHOSPHATE 10 MG/ML IJ SOLN
10.0000 mg | Freq: Once | INTRAMUSCULAR | Status: AC
  Administered 2019-09-12: 10 mg via INTRAVENOUS

## 2019-09-12 MED ORDER — DIPHENHYDRAMINE HCL 50 MG/ML IJ SOLN
25.0000 mg | Freq: Once | INTRAMUSCULAR | Status: AC
  Administered 2019-09-12: 25 mg via INTRAVENOUS

## 2019-09-12 MED ORDER — PALONOSETRON HCL INJECTION 0.25 MG/5ML
0.2500 mg | Freq: Once | INTRAVENOUS | Status: AC
  Administered 2019-09-12: 0.25 mg via INTRAVENOUS

## 2019-09-12 MED ORDER — SODIUM CHLORIDE 0.9% FLUSH
10.0000 mL | Freq: Once | INTRAVENOUS | Status: AC
  Administered 2019-09-12: 10 mL
  Filled 2019-09-12: qty 10

## 2019-09-12 MED ORDER — DIPHENHYDRAMINE HCL 50 MG/ML IJ SOLN
INTRAMUSCULAR | Status: AC
  Filled 2019-09-12: qty 1

## 2019-09-12 MED ORDER — SODIUM CHLORIDE 0.9 % IV SOLN
140.0000 mg/m2 | Freq: Once | INTRAVENOUS | Status: AC
  Administered 2019-09-12: 14:00:00 294 mg via INTRAVENOUS
  Filled 2019-09-12: qty 49

## 2019-09-12 MED ORDER — SODIUM CHLORIDE 0.9 % IV SOLN
150.0000 mg | Freq: Once | INTRAVENOUS | Status: AC
  Administered 2019-09-12: 150 mg via INTRAVENOUS
  Filled 2019-09-12: qty 150

## 2019-09-12 MED ORDER — PALONOSETRON HCL INJECTION 0.25 MG/5ML
INTRAVENOUS | Status: AC
  Filled 2019-09-12: qty 5

## 2019-09-12 MED ORDER — FAMOTIDINE IN NACL 20-0.9 MG/50ML-% IV SOLN
20.0000 mg | Freq: Once | INTRAVENOUS | Status: AC
  Administered 2019-09-12: 20 mg via INTRAVENOUS

## 2019-09-12 NOTE — Progress Notes (Signed)
[id number], A PROSPECTIVE OBSERVATIONAL COHORT STUDY TO DEVELOP A PREDICTIVE MODEL OF TAXANE-INDUCED PERIPHERAL NEUROPATHY IN CANCER PATIENTS. 4 weeks Visit. Patient presented to the clinic, alone, for today's treatment. I met with patient after her lab appointment to complete her assessments. I informed patient that I will be completing her study visit today for research nurse, Doristine Johns, patient gave verbal understanding to this.    PROs: Questionnaires were given to patient to complete in clinic prior to her appointments. Collected questionnaires and checked for completeness and accuracy, patient missed one section of questionnaire, will be contacted by phone.  Labs: Optional whole blood for research collected today. Physician Assessments: CTCAE and Treatment Burden forms completed and signed by Dr. Alvy Bimler. All patient's questions/concerns addressed by MD.  Treatment: Patient to receive 2nd Paclitaxel treatment today. No treatment delays or modifications have occurred.  History of Falls: assessment not required at this time point.    Solicited Neuropathy Events: MD completed the Solicited Neuropathy events form. See below.  Assessment for Interventions for CIPN: Reviewed with patient and CRFs completed. Patient reports she uses ice for bilateral hands and feet during her paclitaxel treatment for prevention of neuropathy. Patient also reports to be taking Claritin, 10 mg, daily for approximately 2 weeks now, as well for neuropathy prevention. Patient states that her multivitamin has B-6 and vitamin E listed but she is unsure of dosage amount. Patient did not bring supplement with her today and will be contacted by phone for clarification of dosages.  Neuropen Assessment: Completed per protocol by this certified research nurse. [place] Assessment: Completed per protocol by this certified Electrical engineer, time and documentation with assistance from research assistant Remer Macho.  Timed Get Up and Go  Test: Not required at this visit. Plan: Informed patient of next study assessments in approximately 4 weeks, for her 8 week assessment, and will be done on same day as treatment. Plan for this visit to be in March. Patient denied having any questions at this time. Patient thanked for her time and contribution to study and was encouraged to call clinic for any questions or concerns she may have prior to her next appointment.   Marnie Fazzino [phone]  09/12/2019  Adverse Event Log  Study/Protocol: S0813 Week 4  Event Grade Drug Name Attribution Treatment Comments  Dyesesthesia 1 Paclitaxel  definite none Icing during taxol infusion  Paresthesia 1 Paclitaxel  definite none Icing during taxol infusion  Peripheral sensory neuropathy 1 Paclitaxel definite none Icing during taxol infusion          Maxwell Marion, RN, BSN, [place] 09/12/2019 1:07 PM

## 2019-09-12 NOTE — Progress Notes (Signed)
Per Dr. Alvy Bimler,  Lee to proceed with chemo today with ALT  88.  Taxol dosage was reduced today per MD.

## 2019-09-12 NOTE — Patient Instructions (Signed)
[facility] Discharge Instructions for Patients Receiving Chemotherapy  Today you received the following chemotherapy agents :  Paclitaxel,  Carboplatin.  To help prevent nausea and vomiting after your treatment, we encourage you to take your nausea medication as prescribed.   If you develop nausea and vomiting that is not controlled by your nausea medication, call the clinic.   BELOW ARE SYMPTOMS THAT SHOULD BE REPORTED IMMEDIATELY:  *FEVER GREATER THAN 100.5 F  *CHILLS WITH OR WITHOUT FEVER  NAUSEA AND VOMITING THAT IS NOT CONTROLLED WITH YOUR NAUSEA MEDICATION  *UNUSUAL SHORTNESS OF BREATH  *UNUSUAL BRUISING OR BLEEDING  TENDERNESS IN MOUTH AND THROAT WITH OR WITHOUT PRESENCE OF ULCERS  *URINARY PROBLEMS  *BOWEL PROBLEMS  UNUSUAL RASH Items with * indicate a potential emergency and should be followed up as soon as possible.  Feel free to call the clinic should you have any questions or concerns. The clinic phone number is [phone].  Please show the CHEMO ALERT CARD at check-in to the Emergency Department and triage nurse.   

## 2019-09-13 ENCOUNTER — Encounter: Payer: Self-pay | Admitting: Hematology and Oncology

## 2019-09-13 DIAGNOSIS — G2581 Restless legs syndrome: Secondary | ICD-10-CM | POA: Insufficient documentation

## 2019-09-13 DIAGNOSIS — G620 Drug-induced polyneuropathy: Secondary | ICD-10-CM | POA: Insufficient documentation

## 2019-09-13 DIAGNOSIS — R748 Abnormal levels of other serum enzymes: Secondary | ICD-10-CM | POA: Insufficient documentation

## 2019-09-13 DIAGNOSIS — M898X9 Other specified disorders of bone, unspecified site: Secondary | ICD-10-CM | POA: Insufficient documentation

## 2019-09-13 NOTE — Progress Notes (Signed)
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 I plan to reduce the dose of treatment as outlined above.  I explained to the patient the rationale of this strategy and reassured the patient it would not compromise the efficacy of treatment I explained to her that she has early onset of peripheral neuropathy likely due to high dose of chemo therapy calculated because of her high BMI  Bone pain This is related to side effects of treatment I recommend she continue over-the-counter analgesics  Restless leg Could be due to side effects of Benadryl Plan to reduce the dose of Benadryl  Elevated liver enzymes Likely due to side effects of treatment and fatty liver disease We will proceed with treatment with dose adjustment as above   No orders of the defined types were placed in this encounter.   All questions were answered. The patient knows to call the clinic with any problems, questions or concerns. The total time spent in the appointment was 30 minutes encounter with patients including review of chart and various tests results, discussions about plan of care and coordination of care plan   Heath Lark, MD 09/13/2019 7:36 AM  INTERVAL HISTORY: Please see below for problem oriented charting. She returns for chemotherapy and follow-up She had multiple side effects from recent chemotherapy She had restless leg developed a few days after treatment She also have burning sensation at the bottom of her feet and headaches and pain  on her shin She has mild pain at the surgical incision sites She was alternating between Tylenol and ibuprofen but it inhibit her activities of daily living Finally, she took some oxycodone and that provided some relief Majority of the sensory neuropathy has improved She denies recent nausea or changes in bowel habits  SUMMARY OF ONCOLOGIC HISTORY: Oncology History Overview Note  Clear cell features   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 There is a 16 cm complex cystic mass in the pelvis containing thickened septations located near midline, abutting the superior aspect of the uterus. I suspect this mass is adnexal/ovarian in origin rather than uterine in origin. Specifically, I suspect the mass likely arises from the left ovary/adnexa and is most consistent with a neoplasm. Malignancy is certainly not excluded on this study. Recommend a pelvic ultrasound for further evaluation. 2. The rounded more solid-appearing structure in the right side of the pelvis is probably the right ovary. Recommend attention on ultrasound. 3. The small amount of fluid in the pelvis is likely reactive to the complex cystic mass likely rising from the left ovary/adnexa. 4. No cause for blood in vomit or black stools identified. No convincing evidence of a perforated or inflamed gastric or duodenal ulcer. 5. Hepatic steatosis.   07/02/2019 Imaging   MRI pelvis 1. 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 There are 2 enhancing nodules within the central upper abdomen raising the possibility of peritoneal  carcinomatosis. Additionally, there is a small amount fluid within the pelvis. Malignant ascites not excluded. 2. Fibroid uterus. 3. Hepatic steatosis.   07/02/2019 Imaging   US pelvis 1. There is a large mass in the pelvis also seen on CT imaging. A separate left  ovary is not visualized. 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 The mass is suspicious for malignancy. Recommend gynecologic consultation. 2. The right ovary demonstrates an irregular ill-defined hypoechoic hypervascular region centrally. The findings are nonspecific in the right ovary. However, given the apparent left ovarian neoplasm suspicious for malignancy, recommend an MRI to further assess the right ovary. 3. Uterine fibroid measuring 3.6 cm.     07/21/2019 Pathology Results   FINAL MICROSCOPIC DIAGNOSIS: A. 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 - See oncology table. B. OMENTUM, RESECTION: - Omental lymph node with metastatic carcinoma (1/1). - Omental adipose tissue with foci of inflammation and reactive mesothelial changes. C. FALLOPIAN TUBE, LEFT, RESECTION: - Fallopian tube with focal, mild inflammation and fibrosis. - No tumor identified. D. UTERUS, CERVIX, RIGHT TUBE AND OVARY: - Cervix Nabothian cyst and squamous metaplasia. - Endometrium Proliferative. No hyperplasia or carcinoma. - Myometrium Leiomyomata. No malignancy. -Right ovary Poorly differentiated carcinoma, 4.8 cm. See oncology table and comment. -Right Fallopian tube Benign paratubal cyst. No endometriosis or malignancy. ONCOLOGY TABLE: OVARY or FALLOPIAN TUBE or PRIMARY PERITONEUM: Procedure: Bilateral f-oophorectomy, hysterectomy and omentectomy. Specimen Integrity: Intact Tumor Site: Left ovary and right ovary. See comment. Ovarian Surface Involvement: Not identified. Fallopian Tube Surface Involvement: Not identified. Tumor Size: Left ovary: 18.6 x 16.0 x 7.2 cm. Right ovary: 4.8 x 3.2 x 3.2 cm. Histologic Type: Clear cell carcinoma. See comment. Histologic Grade: High-grade. Other Tissue/ Organ Involvement: Omental lymph node with metastatic carcinoma. Peritoneal/Ascitic Fluid: Pending. Treatment Effect: No  known presurgical therapy. Pathologic Stage Classification (pTNM, AJCC 8th Edition): pT3a, pN1a. Representative Tumor Block: A2, A3, A4, A5 A6, A7 and A8. Comment(s): The left ovarian tumor is 18.6 cm in greatest dimension and has features of clear cell carcinoma. The right ovarian tumor is 4.8 cm in greatest dimension and is poorly differentiated with focal features consistent with clear cell carcinoma. The pattern of involvement suggests that the right ovarian tumor is likely metastatic from the larger left ovarian clear cell carcinoma. Sections of the omentum show a lymph node with metastatic carcinoma. Sections of the remainder of the omentum do not show involvement by carcinoma.   07/21/2019 Surgery   Procedure(s) Performed: Exploratory laparotomy with radical tumor debulking including total hysterectomy, bilateral salpingo-oophorectomy, infra-gastric omentectomy, and washings.   Surgeon: Jeral Pinch, MD    Operative Findings: On EUA, mobile uterus and ovarian mass, situated out of the pelvis, spanning 6-8 cm above the umbilicus.  On intra-abdominal entry, inflamed omentum adherent to much of the 16 cm left ovarian mass, adhesions easily lysed bluntly.  No nodularity of the ovarian mass however on delivery of the mass through the midline incision, there was Intra-Op rupture of one of the smaller cystic components with drainage of clear brown-tinged fluid.  Grossly normal-appearing left fallopian tube as well as right tube.  Right ovary mildly enlarged measuring approximately 4 cm and firm/nodular, suspicious for tumor involvement.  Uterus 8-10 cm with 4 cm anterior mid body fibroid.  Some very small volume miliary disease along right pelvic sidewall, removed with uterine specimen.  Evidence of small diverticular disease.  Small bowel normal appearing although multiple loops of  bowel adherent to each other, especially by mesentery, and an inflammatory manner.  An approximately 2 x 2 centimeter  nodule suspicious for tumor implant was found in the omentum just inferior to the greater curvature of the stomach.  Additional small (less than 5 mm) implants noted on the stomach and posterior aspect of the left lobe of the liver.  Otherwise the liver surface and diaphragm were smooth.   08/03/2019 Cancer Staging   Staging form: Ovary, Fallopian Tube, and Primary Peritoneal Carcinoma, AJCC 8th Edition - Pathologic: Stage IIIA2 (pT3a, pN1, cM0) - Signed by Heath Lark, MD on 08/03/2019   08/12/2019 Procedure   Placement of single lumen port a cath via right internal jugular vein. The catheter tip lies at the cavo-atrial junction. A power injectable port a cath was placed and is ready for immediate use.   08/22/2019 -  Chemotherapy   The patient had carboplatin and taxol for chemotherapy treatment.       REVIEW OF SYSTEMS:   Constitutional: Denies fevers, chills or abnormal weight loss Eyes: Denies blurriness of vision Ears, nose, mouth, throat, and face: Denies mucositis or sore throat Respiratory: Denies cough, dyspnea or wheezes Cardiovascular: Denies palpitation, chest discomfort or lower extremity swelling Gastrointestinal:  Denies nausea, heartburn or change in bowel habits Skin: Denies abnormal skin rashes Lymphatics: Denies new lymphadenopathy or easy bruising Behavioral/Psych: Mood is stable, no new changes  All other systems were reviewed with the patient and are negative.  I have reviewed the past medical history, past surgical history, social history and family history with the patient and they are unchanged from previous note.  ALLERGIES:  has No Known Allergies.  MEDICATIONS:  Current Outpatient Medications  Medication Sig Dispense Refill  . acetaminophen (TYLENOL) 500 MG tablet Take 1,000 mg by mouth every 6 (six) hours as needed for moderate pain.     Marland Kitchen albuterol (PROVENTIL HFA;VENTOLIN HFA) 108 (90 Base) MCG/ACT inhaler Inhale 2 puffs into the lungs every 4 (four)  hours as needed for wheezing or shortness of breath.    . dexamethasone (DECADRON) 4 MG tablet Take 2 tabs at the night before and 2 tabs the morning of chemotherapy, every 3 weeks, by mouth, please dispense 24 tabs for total 6 cycles of treatment (Patient not taking: Reported on 08/12/2019) 24 tablet 5  . estradiol (CLIMARA) 0.05 mg/24hr patch Place 1 patch (0.05 mg total) onto the skin once a week. 4 patch 12  . famotidine (PEPCID) 20 MG tablet Take 1 tablet (20 mg total) by mouth 2 (two) times daily. 30 tablet 0  . ibuprofen (ADVIL) 800 MG tablet Take 1 tablet (800 mg total) by mouth every 8 (eight) hours as needed for moderate pain. For AFTER surgery only (Patient not taking: Reported on 08/12/2019) 30 tablet 1  . lidocaine-prilocaine (EMLA) cream Apply to affected area once 30 g 3  . Multiple Vitamin (MULTIVITAMIN WITH MINERALS) TABS tablet Take 1 tablet by mouth daily.    Marland Kitchen omeprazole (PRILOSEC) 20 MG capsule Take 40 mg by mouth daily.    . ondansetron (ZOFRAN) 8 MG tablet Take 1 tablet (8 mg total) by mouth every 8 (eight) hours as needed for refractory nausea / vomiting. (Patient not taking: Reported on 08/12/2019) 30 tablet 1  . oxyCODONE (OXY IR/ROXICODONE) 5 MG immediate release tablet Take 1 tablet (5 mg total) by mouth every 4 (four) hours as needed for severe pain. Do not take and drive 20 tablet 0  . prochlorperazine (COMPAZINE) 10 MG tablet  Take 1 tablet (10 mg total) by mouth every 6 (six) hours as needed (Nausea or vomiting). 30 tablet 1  . senna-docusate (SENOKOT-S) 8.6-50 MG tablet Take 2 tablets by mouth at bedtime. For AFTER surgery, do not take if having diarrhea (Patient taking differently: Take 2 tablets by mouth at bedtime as needed. For AFTER surgery, do not take if having diarrhea) 60 tablet 1   No current facility-administered medications for this visit.    PHYSICAL EXAMINATION: ECOG PERFORMANCE STATUS: 1 - Symptomatic but completely ambulatory  Vitals:   09/12/19 1143   BP: 121/88  Pulse: 93  Resp: 18  Temp: 98.7 F (37.1 C)  SpO2: 100%   Filed Weights   09/12/19 1143  Weight: 222 lb 12.8 oz (101.1 kg)    GENERAL:alert, no distress and comfortable SKIN: skin color, texture, turgor are normal, no rashes or significant lesions EYES: normal, Conjunctiva are pink and non-injected, sclera clear OROPHARYNX:no exudate, no erythema and lips, buccal mucosa, and tongue normal  NECK: supple, thyroid normal size, non-tender, without nodularity LYMPH:  no palpable lymphadenopathy in the cervical, axillary or inguinal LUNGS: clear to auscultation and percussion with normal breathing effort HEART: regular rate & rhythm and no murmurs and no lower extremity edema ABDOMEN:abdomen soft, non-tender and normal bowel sounds Musculoskeletal:no cyanosis of digits and no clubbing  NEURO: alert & oriented x 3 with fluent speech, no focal motor/sensory deficits  LABORATORY DATA:  I have reviewed the data as listed    Component Value Date/Time   NA 141 09/12/2019 1128   K 3.8 09/12/2019 1128   CL 107 09/12/2019 1128   CO2 26 09/12/2019 1128   GLUCOSE 119 (H) 09/12/2019 1128   BUN 10 09/12/2019 1128   CREATININE 0.67 09/12/2019 1128   CALCIUM 8.9 09/12/2019 1128   PROT 7.4 09/12/2019 1128   ALBUMIN 3.7 09/12/2019 1128   AST 65 (H) 09/12/2019 1128   ALT 88 (H) 09/12/2019 1128   ALKPHOS 136 (H) 09/12/2019 1128   BILITOT <0.2 (L) 09/12/2019 1128   GFRNONAA >60 09/12/2019 1128   GFRAA >60 09/12/2019 1128    No results found for: SPEP, UPEP  Lab Results  Component Value Date   WBC 7.5 09/12/2019   NEUTROABS 4.9 09/12/2019   HGB 10.7 (L) 09/12/2019   HCT 33.7 (L) 09/12/2019   MCV 87.1 09/12/2019   PLT 298 09/12/2019      Chemistry      Component Value Date/Time   NA 141 09/12/2019 1128   K 3.8 09/12/2019 1128   CL 107 09/12/2019 1128   CO2 26 09/12/2019 1128   BUN 10 09/12/2019 1128   CREATININE 0.67 09/12/2019 1128      Component Value Date/Time    CALCIUM 8.9 09/12/2019 1128   ALKPHOS 136 (H) 09/12/2019 1128   AST 65 (H) 09/12/2019 1128   ALT 88 (H) 09/12/2019 1128   BILITOT <0.2 (L) 09/12/2019 1128

## 2019-09-13 NOTE — Assessment & Plan Note (Signed)
she has mild peripheral neuropathy, likely related to side effects of treatment. I plan to reduce the dose of treatment as outlined above.  I explained to the patient the rationale of this strategy and reassured the patient it would not compromise the efficacy of treatment I explained to her that she has early onset of peripheral neuropathy likely due to high dose of chemo therapy calculated because of her high BMI

## 2019-09-13 NOTE — Assessment & Plan Note (Signed)
She developed multiple side effects from treatment which is not unexpected Due to early onset of peripheral neuropathy, I plan to reduce the dose of Taxol We will proceed with treatment as scheduled

## 2019-09-13 NOTE — Assessment & Plan Note (Signed)
Likely due to side effects of treatment and fatty liver disease We will proceed with treatment with dose adjustment as above

## 2019-09-13 NOTE — Assessment & Plan Note (Signed)
This is related to side effects of treatment I recommend she continue over-the-counter analgesics

## 2019-09-13 NOTE — Assessment & Plan Note (Signed)
Could be due to side effects of Benadryl Plan to reduce the dose of Benadryl

## 2019-09-14 ENCOUNTER — Telehealth: Payer: Self-pay | Admitting: Hematology and Oncology

## 2019-09-14 NOTE — Telephone Encounter (Signed)
Scheduled appt per 2/8 sch message - pt aware of appt date and time   

## 2019-09-15 ENCOUNTER — Other Ambulatory Visit: Payer: Self-pay | Admitting: *Deleted

## 2019-09-15 DIAGNOSIS — C562 Malignant neoplasm of left ovary: Secondary | ICD-10-CM

## 2019-09-19 MED FILL — ESTRADIOL 0.05 MG/DAY PATCH: 0.05 | 28 days supply | Qty: 4 | Fill #1

## 2019-09-23 DIAGNOSIS — Z1379 Encounter for other screening for genetic and chromosomal anomalies: Secondary | ICD-10-CM | POA: Insufficient documentation

## 2019-09-26 ENCOUNTER — Encounter: Payer: Self-pay | Admitting: Genetic Counselor

## 2019-09-26 ENCOUNTER — Telehealth: Payer: Self-pay | Admitting: Genetic Counselor

## 2019-09-26 ENCOUNTER — Ambulatory Visit: Payer: Self-pay | Admitting: Genetic Counselor

## 2019-09-26 DIAGNOSIS — Z1379 Encounter for other screening for genetic and chromosomal anomalies: Secondary | ICD-10-CM

## 2019-09-26 NOTE — Progress Notes (Signed)
HPI:  Sarah Newman was previously seen in the [facility] clinic due to a personal and family history of ovarian cancer and concerns regarding a hereditary predisposition to cancer. Please refer to our prior cancer genetics clinic note for more information regarding our discussion, assessment and recommendations, at the time. Sarah Newman recent genetic test results were disclosed to her, as were recommendations warranted by these results. These results and recommendations are discussed in more detail below.  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 There is a 16 cm complex cystic mass in the pelvis containing thickened septations located near midline, abutting the superior aspect of the uterus. I suspect this mass is adnexal/ovarian in origin rather than uterine in origin. Specifically, I suspect the mass likely arises from the left ovary/adnexa and is most consistent with a neoplasm. Malignancy is certainly not excluded on this study. Recommend a pelvic ultrasound for further evaluation. 2. The rounded more solid-appearing structure in the right side of the pelvis is probably the right ovary. Recommend attention on ultrasound. 3. The small amount of fluid in the pelvis is likely reactive to the complex cystic mass likely rising from the left ovary/adnexa. 4. No cause for blood in vomit or black stools identified. No convincing evidence of a perforated or inflamed gastric or duodenal ulcer. 5. Hepatic steatosis.   07/02/2019 Imaging   MRI pelvis 1. 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 There are 2 enhancing nodules within the central upper abdomen raising the possibility of peritoneal carcinomatosis. Additionally, there is a small amount fluid within the  pelvis. Malignant ascites not excluded. 2. Fibroid uterus. 3. Hepatic steatosis.   07/02/2019 Imaging   US pelvis 1. There is a large mass in the pelvis also seen on CT imaging. A separate left ovary is not visualized. 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 The mass is suspicious for malignancy. Recommend gynecologic consultation. 2. The right ovary demonstrates an irregular ill-defined hypoechoic hypervascular region centrally. The findings are nonspecific in the right ovary. However, given the apparent left ovarian neoplasm suspicious for malignancy, recommend an MRI to further assess the right ovary. 3. Uterine fibroid measuring 3.6 cm.     07/21/2019 Pathology Results   FINAL MICROSCOPIC DIAGNOSIS: A. 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 - See oncology table. B. OMENTUM, RESECTION: - Omental lymph node with metastatic carcinoma (1/1). - Omental adipose tissue with foci of inflammation and reactive mesothelial changes. C. FALLOPIAN TUBE, LEFT, RESECTION: - Fallopian tube with focal, mild inflammation and fibrosis. - No tumor identified. D. UTERUS, CERVIX, RIGHT TUBE AND OVARY: - Cervix Nabothian cyst and squamous metaplasia. - Endometrium Proliferative. No hyperplasia or carcinoma. - Myometrium Leiomyomata. No malignancy. -Right ovary Poorly differentiated carcinoma, 4.8 cm. See oncology table and comment. -Right Fallopian tube Benign paratubal cyst. No endometriosis or malignancy. ONCOLOGY TABLE: OVARY or FALLOPIAN TUBE or PRIMARY PERITONEUM: Procedure: Bilateral f-oophorectomy, hysterectomy and omentectomy. Specimen Integrity: Intact Tumor Site: Left ovary and right ovary. See comment. Ovarian Surface Involvement: Not identified. Fallopian Tube Surface Involvement: Not identified. Tumor Size: Left ovary: 18.6 x 16.0 x 7.2 cm. Right ovary: 4.8 x 3.2 x 3.2 cm. Histologic Type: Clear  cell carcinoma. See  comment. Histologic Grade: High-grade. Other Tissue/ Organ Involvement: Omental lymph node with metastatic carcinoma. Peritoneal/Ascitic Fluid: Pending. Treatment Effect: No known presurgical therapy. Pathologic Stage Classification (pTNM, AJCC 8th Edition): pT3a, pN1a. Representative Tumor Block: A2, A3, A4, A5 A6, A7 and A8. Comment(s): The left ovarian tumor is 18.6 cm in greatest dimension and has features of clear cell carcinoma. The right ovarian tumor is 4.8 cm in greatest dimension and is poorly differentiated with focal features consistent with clear cell carcinoma. The pattern of involvement suggests that the right ovarian tumor is likely metastatic from the larger left ovarian clear cell carcinoma. Sections of the omentum show a lymph node with metastatic carcinoma. Sections of the remainder of the omentum do not show involvement by carcinoma.   07/21/2019 Surgery   Procedure(s) Performed: Exploratory laparotomy with radical tumor debulking including total hysterectomy, bilateral salpingo-oophorectomy, infra-gastric omentectomy, and washings.   Surgeon: Jeral Pinch, MD    Operative Findings: On EUA, mobile uterus and ovarian mass, situated out of the pelvis, spanning 6-8 cm above the umbilicus.  On intra-abdominal entry, inflamed omentum adherent to much of the 16 cm left ovarian mass, adhesions easily lysed bluntly.  No nodularity of the ovarian mass however on delivery of the mass through the midline incision, there was Intra-Op rupture of one of the smaller cystic components with drainage of clear brown-tinged fluid.  Grossly normal-appearing left fallopian tube as well as right tube.  Right ovary mildly enlarged measuring approximately 4 cm and firm/nodular, suspicious for tumor involvement.  Uterus 8-10 cm with 4 cm anterior mid body fibroid.  Some very small volume miliary disease along right pelvic sidewall, removed with uterine specimen.  Evidence of small  diverticular disease.  Small bowel normal appearing although multiple loops of bowel adherent to each other, especially by mesentery, and an inflammatory manner.  An approximately 2 x 2 centimeter nodule suspicious for tumor implant was found in the omentum just inferior to the greater curvature of the stomach.  Additional small (less than 5 mm) implants noted on the stomach and posterior aspect of the left lobe of the liver.  Otherwise the liver surface and diaphragm were smooth.   08/03/2019 Cancer Staging   Staging form: Ovary, Fallopian Tube, and Primary Peritoneal Carcinoma, AJCC 8th Edition - Pathologic: Stage IIIA2 (pT3a, pN1, cM0) - Signed by Heath Lark, MD on 08/03/2019   08/12/2019 Procedure   Placement of single lumen port a cath via right internal jugular vein. The catheter tip lies at the cavo-atrial junction. A power injectable port a cath was placed and is ready for immediate use.   08/22/2019 -  Chemotherapy   The patient had carboplatin and taxol for chemotherapy treatment.     09/23/2019 Genetic Testing   Negative genetic testing:  No pathogenic variants detected on the [facility] TumorNext-HRD+CancerNext paired germline and somatic genetic test. The report date is 09/23/2019.  The TumorNext-HRD+CancerNext test offered by [facility] includes paired germline and tumor analyses of 11 genes associated with homologous recombination repair (ATM, BARD1, BRIP1, CHEK2, MRE11A, NBN, PALB2, RAD51C, RAD51D, BRCA1, BRCA2) plus germline analyses of 26 additional genes associated with hereditary cancer (APC, AXIN2, BMPR1A, CDH1, CDK4, CDKN2A, DICER1, HOXB13, EPCAM, GREM1, MLH1, MSH2, MSH3, MSH6, MUTYH, NF1, NTHL1, PMS2, POLD1, POLE, PTEN, RECQL, SMAD4, SMARCA4, STK11, and TP53).     FAMILY HISTORY:  We obtained a detailed, 4-generation family history.  Significant diagnoses are listed below: Family History  Problem Relation Age of Onset  . Colon cancer Father 34  .  Prostate cancer Father 55  . Colon  cancer Paternal Aunt        dx. early 55s  . Ovarian cancer Paternal Grandmother 37  . Breast cancer Cousin   . Endometriosis Mother   . Cancer Paternal Uncle        unknown type, dx. early 13s  . Cancer Paternal Uncle 48       unknown type   Sarah Newman has two sons, ages 65 and 57. She has one full brother who is 52 and one paternal half-brother who is 37. None of these individuals, nor any of their children, have had cancer.  Sarah Newman mother is currently 29 and has not had cancer. She has four maternal uncles (one of whom died in his mid to late 83s) and four maternal aunts (one of whom died by drowning at age 40). Her maternal grandmother died around age 82, and her maternal grandfather died around age 45. One of her aunt's great-granddaughter died from an unknown type of cancer at age 50 or 51. There are no other known diagnoses of cancer on the maternal side of the family.  Sarah Newman father is currently 49 and has a history of colon cancer diagnosed at age 43 and prostate cancer at age 77. Both cancers were treated with surgery. She has five paternal aunts and eight paternal uncles. One of her aunts died from colon cancer diagnosed in her early 46s, one of her uncles died from an unknown type of cancer diagnosed in his early 43s, and another uncle may have an unknown type of cancer now, as he is receiving chemotherapy. Sarah Newman paternal grandmother died from ovarian cancer around the age of 35, and her paternal grandfather died in his 40s. She also has a paternal first cousin who died from breast cancer in her early 79s. There are no other known diagnoses of cancer on the paternal side of the family.  Sarah Newman is unaware of previous family history of genetic testing for hereditary cancer risks. Patient's maternal ancestors are of [place] descent, and paternal ancestors are of [place] American and Native American descent. There is no reported Ashkenazi  Jewish ancestry. There is no known consanguinity.  GENETIC TEST RESULTS: Genetic testing reported out on 09/23/19 through the [facility] panel. No pathogenic variants were detected.   The TumorNext-HRD+CancerNext test offered by [facility] includes paired germline and tumor analyses of 11 genes associated with homologous recombination repair (ATM, BARD1, BRIP1, CHEK2, MRE11A, NBN, PALB2, RAD51C, RAD51D, BRCA1, BRCA2) plus germline analyses of 26 additional genes associated with hereditary cancer (APC, AXIN2, BMPR1A, CDH1, CDK4, CDKN2A, DICER1, HOXB13, EPCAM, GREM1, MLH1, MSH2, MSH3, MSH6, MUTYH, NF1, NTHL1, PMS2, POLD1, POLE, PTEN, RECQL, SMAD4, SMARCA4, STK11, and TP53). The test report will be scanned into EPIC and located under the Molecular Pathology section of the Results Review tab.  A portion of the result report is included below for reference.     One goal of Sarah Newman's genetic testing was to identify if she may be a candidate for targeted treatment with PARP inhibitors. Therefore, in addition to having germline genetic testing to analyze for inherited variants, Sarah Newman also had testing for homologous recombination deficiency (HRD).  HRD testing is a genetic test performed on the tumor that can determine somatic genetic changes that may indicate if Sarah Newman would benefit from PARP inhibitors.  Because her test did not detect any pathogenic variants in her germline or somatic (tumor) testing, there are no targeted  treatment recommendations based on these results.   We discussed with Sarah Newman that because current genetic testing is not perfect, it is possible there may be a gene mutation in one of these genes that current testing cannot detect, but that chance is small.  We also discussed, that there could be another gene that has not yet been discovered, or that we have not yet tested, that is responsible for the cancer diagnoses in the family. It is also  possible there is a hereditary cause for the cancer in the family that Sarah Newman did not inherit and therefore was not identified in her testing.  Therefore, it is important to remain in touch with cancer genetics in the future so that we can continue to offer Sarah Newman the most up to date genetic testing.   CANCER SCREENING RECOMMENDATIONS: Sarah Newman test result is considered negative (normal).  This means that we have not identified a hereditary cause for her personal and family history of cancer at this time. While reassuring, this does not definitively rule out a hereditary predisposition to cancer. It is still possible that there could be genetic mutations that are undetectable by current technology. There could be genetic mutations in genes that have not been tested or identified to increase cancer risk.  Therefore, it is recommended she continue to follow the cancer management and screening guidelines provided by her oncology and primary healthcare providers.   An individual's cancer risk and medical management are not determined by genetic test results alone. Overall cancer risk assessment incorporates additional factors, including personal medical history, family history, and any available genetic information that may result in a personalized plan for cancer prevention and surveillance.  RECOMMENDATIONS FOR FAMILY MEMBERS:  Individuals in this family might be at some increased risk of developing cancer, over the general population risk, simply due to the family history of cancer.  We recommended women in this family have a yearly mammogram beginning at age 65, or 38 years younger than the earliest onset of cancer, an annual clinical breast exam, and perform monthly breast self-exams. Women in this family should also have a gynecological exam as recommended by their primary provider. All family members should have a colonoscopy by age 76.  It is also possible there is a hereditary  cause for the cancer in Sarah Newman family that she did not inherit and therefore was not identified in her.  Based on Sarah Newman's family history, we recommended her father, who was diagnosed with colon cancer at age 10 and prostate cancer at age 108, have genetic counseling and testing. Sarah Newman will let us know if we can be of any assistance in coordinating genetic counseling and/or testing for this family member.   FOLLOW-UP: Lastly, we discussed with Ms. Dorsey that cancer genetics is a rapidly advancing field and it is possible that new genetic tests will be appropriate for her and/or her family members in the future. We encouraged her to remain in contact with cancer genetics on an annual basis so we can update her personal and family histories and let her know of advances in cancer genetics that may benefit this family.   Our contact number was provided. Ms. Caserta questions were answered to her satisfaction, and she knows she is welcome to call us at anytime with additional questions or concerns.   Clint Guy, MS, [facility] Genetic Counselor [email or web address] Phone: [phone]

## 2019-09-26 NOTE — Telephone Encounter (Signed)
Revealed negative genetic testing.  Discussed that we do not know why she has ovarian cancer or why there is cancer in the family.  It could be due to a different gene that we are not testing, or our current technology may not be able detect something.  It is also possible that there is a hereditary cause for the cancer in the family that she did not inherit.  It will be important for her to keep in contact with genetics to keep up with whether additional testing may be appropriate in the future.

## 2019-10-03 ENCOUNTER — Inpatient Hospital Stay: Payer: BC Managed Care – PPO

## 2019-10-03 ENCOUNTER — Inpatient Hospital Stay (HOSPITAL_BASED_OUTPATIENT_CLINIC_OR_DEPARTMENT_OTHER): Payer: BC Managed Care – PPO | Admitting: Hematology and Oncology

## 2019-10-03 ENCOUNTER — Other Ambulatory Visit: Payer: Self-pay

## 2019-10-03 ENCOUNTER — Encounter: Payer: Self-pay | Admitting: Hematology and Oncology

## 2019-10-03 ENCOUNTER — Inpatient Hospital Stay: Payer: BC Managed Care – PPO | Attending: Gynecologic Oncology

## 2019-10-03 VITALS — BP 141/97 | HR 86 | Temp 97.5°F | Resp 18 | Ht 62.0 in | Wt 221.0 lb

## 2019-10-03 DIAGNOSIS — R748 Abnormal levels of other serum enzymes: Secondary | ICD-10-CM | POA: Diagnosis not present

## 2019-10-03 DIAGNOSIS — Z006 Encounter for examination for normal comparison and control in clinical research program: Secondary | ICD-10-CM | POA: Insufficient documentation

## 2019-10-03 DIAGNOSIS — M898X9 Other specified disorders of bone, unspecified site: Secondary | ICD-10-CM | POA: Insufficient documentation

## 2019-10-03 DIAGNOSIS — G2581 Restless legs syndrome: Secondary | ICD-10-CM | POA: Diagnosis not present

## 2019-10-03 DIAGNOSIS — G620 Drug-induced polyneuropathy: Secondary | ICD-10-CM | POA: Insufficient documentation

## 2019-10-03 DIAGNOSIS — Z79890 Hormone replacement therapy (postmenopausal): Secondary | ICD-10-CM | POA: Insufficient documentation

## 2019-10-03 DIAGNOSIS — Z79899 Other long term (current) drug therapy: Secondary | ICD-10-CM | POA: Diagnosis not present

## 2019-10-03 DIAGNOSIS — Z7952 Long term (current) use of systemic steroids: Secondary | ICD-10-CM | POA: Diagnosis not present

## 2019-10-03 DIAGNOSIS — R739 Hyperglycemia, unspecified: Secondary | ICD-10-CM

## 2019-10-03 DIAGNOSIS — C562 Malignant neoplasm of left ovary: Secondary | ICD-10-CM

## 2019-10-03 DIAGNOSIS — Z5111 Encounter for antineoplastic chemotherapy: Secondary | ICD-10-CM | POA: Insufficient documentation

## 2019-10-03 DIAGNOSIS — R635 Abnormal weight gain: Secondary | ICD-10-CM | POA: Insufficient documentation

## 2019-10-03 DIAGNOSIS — T50905A Adverse effect of unspecified drugs, medicaments and biological substances, initial encounter: Secondary | ICD-10-CM

## 2019-10-03 DIAGNOSIS — K760 Fatty (change of) liver, not elsewhere classified: Secondary | ICD-10-CM | POA: Diagnosis not present

## 2019-10-03 DIAGNOSIS — R5383 Other fatigue: Secondary | ICD-10-CM | POA: Insufficient documentation

## 2019-10-03 DIAGNOSIS — T451X5A Adverse effect of antineoplastic and immunosuppressive drugs, initial encounter: Secondary | ICD-10-CM

## 2019-10-03 LAB — CMP (CANCER CENTER ONLY)
ALT: 84 U/L — ABNORMAL HIGH (ref 0–44)
AST: 67 U/L — ABNORMAL HIGH (ref 15–41)
Albumin: 3.9 g/dL (ref 3.5–5.0)
Alkaline Phosphatase: 149 U/L — ABNORMAL HIGH (ref 38–126)
Anion gap: 11 (ref 5–15)
BUN: 10 mg/dL (ref 6–20)
CO2: 24 mmol/L (ref 22–32)
Calcium: 9.8 mg/dL (ref 8.9–10.3)
Chloride: 104 mmol/L (ref 98–111)
Creatinine: 0.73 mg/dL (ref 0.44–1.00)
GFR, Est AFR Am: >60 mL/min (ref >60)
GFR, Estimated: >60 mL/min (ref >60)
Glucose, Bld: 223 mg/dL — ABNORMAL HIGH (ref 70–99)
Potassium: 4.2 mmol/L (ref 3.5–5.1)
Sodium: 139 mmol/L (ref 135–145)
Total Bilirubin: <0.2 mg/dL — ABNORMAL LOW (ref 0.3–1.2)
Total Protein: 8.4 g/dL — ABNORMAL HIGH (ref 6.5–8.1)

## 2019-10-03 LAB — CBC WITH DIFFERENTIAL (CANCER CENTER ONLY)
Abs Immature Granulocytes: 0.1 10*3/uL — ABNORMAL HIGH (ref 0.00–0.07)
Basophils Absolute: 0 10*3/uL (ref 0.0–0.1)
Basophils Relative: 0 %
Eosinophils Absolute: 0 10*3/uL (ref 0.0–0.5)
Eosinophils Relative: 0 %
HCT: 35 % — ABNORMAL LOW (ref 36.0–46.0)
Hemoglobin: 11.4 g/dL — ABNORMAL LOW (ref 12.0–15.0)
Immature Granulocytes: 1 %
Lymphocytes Relative: 11 %
Lymphs Abs: 1.1 10*3/uL (ref 0.7–4.0)
MCH: 27.3 pg (ref 26.0–34.0)
MCHC: 32.6 g/dL (ref 30.0–36.0)
MCV: 83.9 fL (ref 80.0–100.0)
Monocytes Absolute: 0.1 10*3/uL (ref 0.1–1.0)
Monocytes Relative: 1 %
Neutro Abs: 8.8 10*3/uL — ABNORMAL HIGH (ref 1.7–7.7)
Neutrophils Relative %: 87 %
Platelet Count: 382 10*3/uL (ref 150–400)
RBC: 4.17 MIL/uL (ref 3.87–5.11)
RDW: 12.8 % (ref 11.5–15.5)
WBC Count: 10.1 10*3/uL (ref 4.0–10.5)
nRBC: 0 % (ref 0.0–0.2)

## 2019-10-03 MED ORDER — DEXAMETHASONE SODIUM PHOSPHATE 10 MG/ML IJ SOLN
10.0000 mg | Freq: Once | INTRAMUSCULAR | Status: AC
  Administered 2019-10-03: 10 mg via INTRAVENOUS

## 2019-10-03 MED ORDER — FAMOTIDINE IN NACL 20-0.9 MG/50ML-% IV SOLN
INTRAVENOUS | Status: AC
  Filled 2019-10-03: qty 50

## 2019-10-03 MED ORDER — SODIUM CHLORIDE 0.9 % IV SOLN
750.0000 mg | Freq: Once | INTRAVENOUS | Status: AC
  Administered 2019-10-03: 750 mg via INTRAVENOUS
  Filled 2019-10-03: qty 75

## 2019-10-03 MED ORDER — SODIUM CHLORIDE 0.9 % IV SOLN
140.0000 mg/m2 | Freq: Once | INTRAVENOUS | Status: AC
  Administered 2019-10-03: 294 mg via INTRAVENOUS
  Filled 2019-10-03: qty 49

## 2019-10-03 MED ORDER — PALONOSETRON HCL INJECTION 0.25 MG/5ML
0.2500 mg | Freq: Once | INTRAVENOUS | Status: AC
  Administered 2019-10-03: 0.25 mg via INTRAVENOUS

## 2019-10-03 MED ORDER — SODIUM CHLORIDE 0.9 % IV SOLN
150.0000 mg | Freq: Once | INTRAVENOUS | Status: AC
  Administered 2019-10-03: 150 mg via INTRAVENOUS
  Filled 2019-10-03: qty 150

## 2019-10-03 MED ORDER — ACETAMINOPHEN 325 MG PO TABS
650.0000 mg | ORAL_TABLET | Freq: Once | ORAL | Status: AC
  Administered 2019-10-03: 650 mg via ORAL

## 2019-10-03 MED ORDER — METFORMIN HCL 500 MG PO TABS: 500.0000 mg | ORAL_TABLET | Freq: Two times a day (BID) | ORAL | 1 refills | Status: DC

## 2019-10-03 MED ORDER — SODIUM CHLORIDE 0.9 % IV SOLN
Freq: Once | INTRAVENOUS | Status: AC
  Filled 2019-10-03: qty 250

## 2019-10-03 MED ORDER — SODIUM CHLORIDE 0.9% FLUSH
10.0000 mL | Freq: Once | INTRAVENOUS | Status: AC
  Administered 2019-10-03: 10 mL
  Filled 2019-10-03: qty 10

## 2019-10-03 MED ORDER — FAMOTIDINE IN NACL 20-0.9 MG/50ML-% IV SOLN
20.0000 mg | Freq: Once | INTRAVENOUS | Status: AC
  Administered 2019-10-03: 20 mg via INTRAVENOUS

## 2019-10-03 MED ORDER — ACETAMINOPHEN 325 MG PO TABS
ORAL_TABLET | ORAL | Status: AC
  Filled 2019-10-03: qty 2

## 2019-10-03 MED ORDER — HEPARIN SOD (PORK) LOCK FLUSH 100 UNIT/ML IV SOLN
500.0000 [IU] | Freq: Once | INTRAVENOUS | Status: AC | PRN
  Administered 2019-10-03: 500 [IU]
  Filled 2019-10-03: qty 5

## 2019-10-03 MED ORDER — SODIUM CHLORIDE 0.9% FLUSH
10.0000 mL | INTRAVENOUS | Status: DC | PRN
  Administered 2019-10-03: 10 mL
  Filled 2019-10-03: qty 10

## 2019-10-03 MED ORDER — DIPHENHYDRAMINE HCL 50 MG/ML IJ SOLN
25.0000 mg | Freq: Once | INTRAMUSCULAR | Status: AC
  Administered 2019-10-03: 25 mg via INTRAVENOUS

## 2019-10-03 MED ORDER — PALONOSETRON HCL INJECTION 0.25 MG/5ML
INTRAVENOUS | Status: AC
  Filled 2019-10-03: qty 5

## 2019-10-03 MED ORDER — DEXAMETHASONE SODIUM PHOSPHATE 10 MG/ML IJ SOLN
INTRAMUSCULAR | Status: AC
  Filled 2019-10-03: qty 1

## 2019-10-03 MED ORDER — DIPHENHYDRAMINE HCL 50 MG/ML IJ SOLN
INTRAMUSCULAR | Status: AC
  Filled 2019-10-03: qty 1

## 2019-10-03 MED FILL — metFORMIN HCL 500 MG TABS: 500 | 30 days supply | Qty: 60 | Fill #0

## 2019-10-03 NOTE — Assessment & Plan Note (Signed)
This is better controlled with recent oxycodone We discussed narcotic refill policy

## 2019-10-03 NOTE — Patient Instructions (Signed)
   [facility] Discharge Instructions for Patients Receiving Chemotherapy  Today you received the following chemotherapy agents Taxol and Carboplatin   To help prevent nausea and vomiting after your treatment, we encourage you to take your nausea medication as directed.    If you develop nausea and vomiting that is not controlled by your nausea medication, call the clinic.   BELOW ARE SYMPTOMS THAT SHOULD BE REPORTED IMMEDIATELY:  *FEVER GREATER THAN 100.5 F  *CHILLS WITH OR WITHOUT FEVER  NAUSEA AND VOMITING THAT IS NOT CONTROLLED WITH YOUR NAUSEA MEDICATION  *UNUSUAL SHORTNESS OF BREATH  *UNUSUAL BRUISING OR BLEEDING  TENDERNESS IN MOUTH AND THROAT WITH OR WITHOUT PRESENCE OF ULCERS  *URINARY PROBLEMS  *BOWEL PROBLEMS  UNUSUAL RASH Items with * indicate a potential emergency and should be followed up as soon as possible.  Feel free to call the clinic should you have any questions or concerns. The clinic phone number is [phone].  Please show the CHEMO ALERT CARD at check-in to the Emergency Department and triage nurse.   

## 2019-10-03 NOTE — Assessment & Plan Note (Signed)
Her neuropathy has improved with recent dose adjustment She will continue the same dose as before

## 2019-10-03 NOTE — Assessment & Plan Note (Signed)
She tolerated recent reduced dose treatment better She continues to have significant elevated LFTs secondary to fatty liver disease We will proceed with similar dose adjustment as before The plan will be to continue total 6 cycles of treatment

## 2019-10-03 NOTE — Assessment & Plan Note (Signed)
This is an anticipated side effects from steroid therapy We discussed dietary modification She is willing to try Metformin

## 2019-10-03 NOTE — Assessment & Plan Note (Signed)
This is due to fatty liver disease We discussed dietary modification and weight loss management She is interested to try Metformin to help control her weight and avoid development into full-blown diabetes We discussed some of the side effects to be expected from Metformin

## 2019-10-03 NOTE — Assessment & Plan Note (Signed)
This is improved with recent dose adjustment of Benadryl She will continue the same

## 2019-10-03 NOTE — Progress Notes (Signed)
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[place]) She tolerated recent reduced dose treatment better She continues to have significant elevated LFTs secondary to fatty liver disease We will proceed with similar dose adjustment as before The plan will be to continue total 6 cycles of treatment  Peripheral neuropathy due to chemotherapy ([place]) Her neuropathy has improved with recent dose adjustment She will continue the same dose as before  Elevated liver enzymes This is due to fatty liver disease We discussed dietary modification and weight loss management She is interested to try Metformin to help control her weight and avoid development into full-blown diabetes We discussed some of the side effects to be expected from Metformin  Bone pain This is better controlled with recent oxycodone We discussed narcotic refill policy  Drug-induced hyperglycemia This is an anticipated side effects from steroid therapy We discussed dietary modification She is willing to try Metformin  Restless leg This is improved with recent dose adjustment of Benadryl She will continue the same   No orders of the defined types were placed in this encounter.   All questions were answered. The patient knows to call the clinic with any problems, questions or concerns. The total time spent in the appointment was 30 minutes encounter with patients including review of chart and various tests results, discussions about plan of care and coordination of care plan   Heath Lark, MD 10/03/2019 3:02 PM  INTERVAL HISTORY: Please see below for problem oriented charting. 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 There is a 16 cm complex cystic mass in the pelvis containing thickened septations located near midline, abutting the superior aspect of the uterus. I suspect this mass is adnexal/ovarian in origin rather than uterine in origin. Specifically, I suspect the mass likely arises from the left ovary/adnexa and is most consistent with a neoplasm. Malignancy is certainly not excluded on this study. Recommend a pelvic ultrasound for further evaluation. 2. The rounded more solid-appearing structure in the right side of the pelvis is probably the right ovary. Recommend attention on ultrasound. 3. The small amount of fluid in the pelvis is likely reactive to the complex cystic mass likely rising from the left ovary/adnexa. 4. No cause for blood in vomit or black stools identified. No convincing evidence of a perforated or inflamed gastric or duodenal ulcer. 5. Hepatic steatosis.   07/02/2019 Imaging   MRI pelvis 1. 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 There are 2 enhancing nodules within the central upper abdomen raising the possibility of peritoneal carcinomatosis. Additionally, there is a small amount fluid within the pelvis. Malignant ascites not excluded. 2. Fibroid uterus. 3. Hepatic steatosis.   07/02/2019 Imaging   US pelvis 1. There is a large mass in the pelvis also seen on CT imaging. A separate left ovary is not visualized. I suspect the large  mass represents a large neoplasm arising from the left ovary. The mass is suspicious for malignancy. Recommend gynecologic consultation. 2. The right ovary demonstrates an irregular ill-defined hypoechoic  hypervascular region centrally. The findings are nonspecific in the right ovary. However, given the apparent left ovarian neoplasm suspicious for malignancy, recommend an MRI to further assess the right ovary. 3. Uterine fibroid measuring 3.6 cm.     07/21/2019 Pathology Results   FINAL MICROSCOPIC DIAGNOSIS: A. 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 - See oncology table. B. OMENTUM, RESECTION: - Omental lymph node with metastatic carcinoma (1/1). - Omental adipose tissue with foci of inflammation and reactive mesothelial changes. C. FALLOPIAN TUBE, LEFT, RESECTION: - Fallopian tube with focal, mild inflammation and fibrosis. - No tumor identified. D. UTERUS, CERVIX, RIGHT TUBE AND OVARY: - Cervix Nabothian cyst and squamous metaplasia. - Endometrium Proliferative. No hyperplasia or carcinoma. - Myometrium Leiomyomata. No malignancy. -Right ovary Poorly differentiated carcinoma, 4.8 cm. See oncology table and comment. -Right Fallopian tube Benign paratubal cyst. No endometriosis or malignancy. ONCOLOGY TABLE: OVARY or FALLOPIAN TUBE or PRIMARY PERITONEUM: Procedure: Bilateral f-oophorectomy, hysterectomy and omentectomy. Specimen Integrity: Intact Tumor Site: Left ovary and right ovary. See comment. Ovarian Surface Involvement: Not identified. Fallopian Tube Surface Involvement: Not identified. Tumor Size: Left ovary: 18.6 x 16.0 x 7.2 cm. Right ovary: 4.8 x 3.2 x 3.2 cm. Histologic Type: Clear cell carcinoma. See comment. Histologic Grade: High-grade. Other Tissue/ Organ Involvement: Omental lymph node with metastatic carcinoma. Peritoneal/Ascitic Fluid: Pending. Treatment Effect: No known presurgical therapy. Pathologic Stage Classification (pTNM, AJCC 8th Edition): pT3a, pN1a. Representative Tumor Block: A2, A3, A4, A5 A6, A7 and A8. Comment(s): The left ovarian tumor is 18.6 cm in greatest dimension and has features of clear  cell carcinoma. The right ovarian tumor is 4.8 cm in greatest dimension and is poorly differentiated with focal features consistent with clear cell carcinoma. The pattern of involvement suggests that the right ovarian tumor is likely metastatic from the larger left ovarian clear cell carcinoma. Sections of the omentum show a lymph node with metastatic carcinoma. Sections of the remainder of the omentum do not show involvement by carcinoma.   07/21/2019 Surgery   Procedure(s) Performed: Exploratory laparotomy with radical tumor debulking including total hysterectomy, bilateral salpingo-oophorectomy, infra-gastric omentectomy, and washings.   Surgeon: Jeral Pinch, MD    Operative Findings: On EUA, mobile uterus and ovarian mass, situated out of the pelvis, spanning 6-8 cm above the umbilicus.  On intra-abdominal entry, inflamed omentum adherent to much of the 16 cm left ovarian mass, adhesions easily lysed bluntly.  No nodularity of the ovarian mass however on delivery of the mass through the midline incision, there was Intra-Op rupture of one of the smaller cystic components with drainage of clear brown-tinged fluid.  Grossly normal-appearing left fallopian tube as well as right tube.  Right ovary mildly enlarged measuring approximately 4 cm and firm/nodular, suspicious for tumor involvement.  Uterus 8-10 cm with 4 cm anterior mid body fibroid.  Some very small volume miliary disease along right pelvic sidewall, removed with uterine specimen.  Evidence of small diverticular disease.  Small bowel normal appearing although multiple loops of bowel adherent to each other, especially by mesentery, and an inflammatory manner.  An approximately 2 x 2 centimeter nodule suspicious for tumor implant was found in the omentum just inferior to the greater curvature of the stomach.  Additional small (less than 5 mm) implants noted on the stomach and posterior aspect of  the left lobe of the liver.  Otherwise the liver  surface and diaphragm were smooth.   08/03/2019 Cancer Staging   Staging form: Ovary, Fallopian Tube, and Primary Peritoneal Carcinoma, AJCC 8th Edition - Pathologic: Stage IIIA2 (pT3a, pN1, cM0) - Signed by Heath Lark, MD on 08/03/2019   08/12/2019 Procedure   Placement of single lumen port a cath via right internal jugular vein. The catheter tip lies at the cavo-atrial junction. A power injectable port a cath was placed and is ready for immediate use.   08/22/2019 -  Chemotherapy   The patient had carboplatin and taxol for chemotherapy treatment.     09/23/2019 Genetic Testing   Negative genetic testing:  No pathogenic variants detected on the [facility] TumorNext-HRD+CancerNext paired germline and somatic genetic test. The report date is 09/23/2019.  The TumorNext-HRD+CancerNext test offered by [facility] includes paired germline and tumor analyses of 11 genes associated with homologous recombination repair (ATM, BARD1, BRIP1, CHEK2, MRE11A, NBN, PALB2, RAD51C, RAD51D, BRCA1, BRCA2) plus germline analyses of 26 additional genes associated with hereditary cancer (APC, AXIN2, BMPR1A, CDH1, CDK4, CDKN2A, DICER1, HOXB13, EPCAM, GREM1, MLH1, MSH2, MSH3, MSH6, MUTYH, NF1, NTHL1, PMS2, POLD1, POLE, PTEN, RECQL, SMAD4, SMARCA4, STK11, and TP53).     REVIEW OF SYSTEMS:   Constitutional: Denies fevers, chills or abnormal weight loss Eyes: Denies blurriness of vision Ears, nose, mouth, throat, and face: Denies mucositis or sore throat Respiratory: Denies cough, dyspnea or wheezes Cardiovascular: Denies palpitation, chest discomfort or lower extremity swelling Gastrointestinal:  Denies nausea, heartburn or change in bowel habits Skin: Denies abnormal skin rashes Lymphatics: Denies new lymphadenopathy or easy bruising Behavioral/Psych: Mood is stable, no new changes  All other systems were reviewed with the patient and are negative.  I have reviewed the past medical history, past surgical history, social  history and family history with the patient and they are unchanged from previous note.  ALLERGIES:  has No Known Allergies.  MEDICATIONS:  Current Outpatient Medications  Medication Sig Dispense Refill  . acetaminophen (TYLENOL) 500 MG tablet Take 1,000 mg by mouth every 6 (six) hours as needed for moderate pain.     Marland Kitchen albuterol (PROVENTIL HFA;VENTOLIN HFA) 108 (90 Base) MCG/ACT inhaler Inhale 2 puffs into the lungs every 4 (four) hours as needed for wheezing or shortness of breath.    . dexamethasone (DECADRON) 4 MG tablet Take 2 tabs at the night before and 2 tabs the morning of chemotherapy, every 3 weeks, by mouth, please dispense 24 tabs for total 6 cycles of treatment (Patient not taking: Reported on 08/12/2019) 24 tablet 5  . estradiol (CLIMARA) 0.05 mg/24hr patch Place 1 patch (0.05 mg total) onto the skin once a week. 4 patch 12  . famotidine (PEPCID) 20 MG tablet Take 1 tablet (20 mg total) by mouth 2 (two) times daily. 30 tablet 0  . ibuprofen (ADVIL) 800 MG tablet Take 1 tablet (800 mg total) by mouth every 8 (eight) hours as needed for moderate pain. For AFTER surgery only (Patient not taking: Reported on 08/12/2019) 30 tablet 1  . lidocaine-prilocaine (EMLA) cream Apply to affected area once 30 g 3  . metFORMIN (GLUCOPHAGE) 500 MG tablet Take 1 tablet (500 mg total) by mouth 2 (two) times daily with a meal. 60 tablet 1  . Multiple Vitamin (MULTIVITAMIN WITH MINERALS) TABS tablet Take 1 tablet by mouth daily.    Marland Kitchen omeprazole (PRILOSEC) 20 MG capsule Take 40 mg by mouth daily.    . ondansetron (ZOFRAN) 8 MG  tablet Take 1 tablet (8 mg total) by mouth every 8 (eight) hours as needed for refractory nausea / vomiting. (Patient not taking: Reported on 08/12/2019) 30 tablet 1  . oxyCODONE (OXY IR/ROXICODONE) 5 MG immediate release tablet Take 1 tablet (5 mg total) by mouth every 4 (four) hours as needed for severe pain. Do not take and drive 20 tablet 0  . prochlorperazine (COMPAZINE) 10 MG tablet  Take 1 tablet (10 mg total) by mouth every 6 (six) hours as needed (Nausea or vomiting). 30 tablet 1   No current facility-administered medications for this visit.   Facility-Administered Medications Ordered in Other Visits  Medication Dose Route Frequency Provider Last Rate Last Admin  . CARBOplatin (PARAPLATIN) 750 mg in sodium chloride 0.9 % 250 mL chemo infusion  750 mg Intravenous Once Alvy Bimler, Caven Perine, MD      . heparin lock flush 100 unit/mL  500 Units Intracatheter Once PRN Alvy Bimler, Juvenal Umar, MD      . PACLitaxel (TAXOL) 294 mg in sodium chloride 0.9 % 250 mL chemo infusion (> 5m/m2)  140 mg/m2 (Treatment Plan Recorded) Intravenous Once GAlvy Bimler Bora Bost, MD 100 mL/hr at 10/03/19 1326 294 mg at 10/03/19 1326  . sodium chloride flush (NS) 0.9 % injection 10 mL  10 mL Intracatheter PRN GAlvy Bimler Chenay Nesmith, MD        PHYSICAL EXAMINATION: ECOG PERFORMANCE STATUS: 1 - Symptomatic but completely ambulatory  Vitals:   10/03/19 1059  BP: (!) 141/97  Pulse: 86  Resp: 18  Temp: (!) 97.5 F (36.4 C)  SpO2: 100%   Filed Weights   10/03/19 1059  Weight: 221 lb (100.2 kg)    GENERAL:alert, no distress and comfortable SKIN: skin color, texture, turgor are normal, no rashes or significant lesions EYES: normal, Conjunctiva are pink and non-injected, sclera clear OROPHARYNX:no exudate, no erythema and lips, buccal mucosa, and tongue normal  NECK: supple, thyroid normal size, non-tender, without nodularity LYMPH:  no palpable lymphadenopathy in the cervical, axillary or inguinal LUNGS: clear to auscultation and percussion with normal breathing effort HEART: regular rate & rhythm and no murmurs and no lower extremity edema ABDOMEN:abdomen soft, non-tender and normal bowel sounds.  Noted well-healed surgical scar Musculoskeletal:no cyanosis of digits and no clubbing  NEURO: alert & oriented x 3 with fluent speech, no focal motor/sensory deficits  LABORATORY DATA:  I have reviewed the data as listed     Component Value Date/Time   NA 139 10/03/2019 1022   K 4.2 10/03/2019 1022   CL 104 10/03/2019 1022   CO2 24 10/03/2019 1022   GLUCOSE 223 (H) 10/03/2019 1022   BUN 10 10/03/2019 1022   CREATININE 0.73 10/03/2019 1022   CALCIUM 9.8 10/03/2019 1022   PROT 8.4 (H) 10/03/2019 1022   ALBUMIN 3.9 10/03/2019 1022   AST 67 (H) 10/03/2019 1022   ALT 84 (H) 10/03/2019 1022   ALKPHOS 149 (H) 10/03/2019 1022   BILITOT <0.2 (L) 10/03/2019 1022   GFRNONAA >60 10/03/2019 1022   GFRAA >60 10/03/2019 1022    No results found for: SPEP, UPEP  Lab Results  Component Value Date   WBC 10.1 10/03/2019   NEUTROABS 8.8 (H) 10/03/2019   HGB 11.4 (L) 10/03/2019   HCT 35.0 (L) 10/03/2019   MCV 83.9 10/03/2019   PLT 382 10/03/2019      Chemistry      Component Value Date/Time   NA 139 10/03/2019 1022   K 4.2 10/03/2019 1022   CL 104 10/03/2019 1022  CO2 24 10/03/2019 1022   BUN 10 10/03/2019 1022   CREATININE 0.73 10/03/2019 1022      Component Value Date/Time   CALCIUM 9.8 10/03/2019 1022   ALKPHOS 149 (H) 10/03/2019 1022   AST 67 (H) 10/03/2019 1022   ALT 84 (H) 10/03/2019 1022   BILITOT <0.2 (L) 10/03/2019 1022

## 2019-10-05 NOTE — Progress Notes (Addendum)
10/03/19: Per Sandi Mealy, PA ok to give tylenol (see eMAR) for pt c/o headache.

## 2019-10-24 ENCOUNTER — Inpatient Hospital Stay: Payer: BC Managed Care – PPO

## 2019-10-24 ENCOUNTER — Other Ambulatory Visit: Payer: BC Managed Care – PPO

## 2019-10-24 ENCOUNTER — Inpatient Hospital Stay (HOSPITAL_BASED_OUTPATIENT_CLINIC_OR_DEPARTMENT_OTHER): Payer: BC Managed Care – PPO | Admitting: Hematology and Oncology

## 2019-10-24 ENCOUNTER — Telehealth: Payer: Self-pay | Admitting: Hematology and Oncology

## 2019-10-24 ENCOUNTER — Other Ambulatory Visit: Payer: Self-pay

## 2019-10-24 ENCOUNTER — Ambulatory Visit: Payer: BC Managed Care – PPO

## 2019-10-24 ENCOUNTER — Encounter: Payer: Self-pay | Admitting: *Deleted

## 2019-10-24 ENCOUNTER — Ambulatory Visit: Payer: BC Managed Care – PPO | Admitting: Hematology and Oncology

## 2019-10-24 VITALS — BP 131/93 | HR 88 | Temp 98.0°F | Resp 18 | Ht 62.0 in | Wt 228.4 lb

## 2019-10-24 DIAGNOSIS — Z006 Encounter for examination for normal comparison and control in clinical research program: Secondary | ICD-10-CM | POA: Diagnosis not present

## 2019-10-24 DIAGNOSIS — Z5111 Encounter for antineoplastic chemotherapy: Secondary | ICD-10-CM | POA: Diagnosis not present

## 2019-10-24 DIAGNOSIS — R635 Abnormal weight gain: Secondary | ICD-10-CM

## 2019-10-24 DIAGNOSIS — N898 Other specified noninflammatory disorders of vagina: Secondary | ICD-10-CM | POA: Insufficient documentation

## 2019-10-24 DIAGNOSIS — Z95828 Presence of other vascular implants and grafts: Secondary | ICD-10-CM

## 2019-10-24 DIAGNOSIS — C562 Malignant neoplasm of left ovary: Secondary | ICD-10-CM

## 2019-10-24 DIAGNOSIS — R748 Abnormal levels of other serum enzymes: Secondary | ICD-10-CM

## 2019-10-24 LAB — CMP (CANCER CENTER ONLY)
ALT: 59 U/L — ABNORMAL HIGH (ref 0–44)
AST: 40 U/L (ref 15–41)
Albumin: 3.8 g/dL (ref 3.5–5.0)
Alkaline Phosphatase: 122 U/L (ref 38–126)
Anion gap: 10 (ref 5–15)
BUN: 11 mg/dL (ref 6–20)
CO2: 23 mmol/L (ref 22–32)
Calcium: 9.6 mg/dL (ref 8.9–10.3)
Chloride: 106 mmol/L (ref 98–111)
Creatinine: 0.7 mg/dL (ref 0.44–1.00)
GFR, Est AFR Am: >60 mL/min (ref >60)
GFR, Estimated: >60 mL/min (ref >60)
Glucose, Bld: 176 mg/dL — ABNORMAL HIGH (ref 70–99)
Potassium: 3.9 mmol/L (ref 3.5–5.1)
Sodium: 139 mmol/L (ref 135–145)
Total Bilirubin: <0.2 mg/dL — ABNORMAL LOW (ref 0.3–1.2)
Total Protein: 7.6 g/dL (ref 6.5–8.1)

## 2019-10-24 LAB — CBC WITH DIFFERENTIAL (CANCER CENTER ONLY)
Abs Immature Granulocytes: 0.06 10*3/uL (ref 0.00–0.07)
Basophils Absolute: 0 10*3/uL (ref 0.0–0.1)
Basophils Relative: 0 %
Eosinophils Absolute: 0 10*3/uL (ref 0.0–0.5)
Eosinophils Relative: 0 %
HCT: 32.6 % — ABNORMAL LOW (ref 36.0–46.0)
Hemoglobin: 10.6 g/dL — ABNORMAL LOW (ref 12.0–15.0)
Immature Granulocytes: 1 %
Lymphocytes Relative: 11 %
Lymphs Abs: 1 10*3/uL (ref 0.7–4.0)
MCH: 27.5 pg (ref 26.0–34.0)
MCHC: 32.5 g/dL (ref 30.0–36.0)
MCV: 84.5 fL (ref 80.0–100.0)
Monocytes Absolute: 0.1 10*3/uL (ref 0.1–1.0)
Monocytes Relative: 1 %
Neutro Abs: 7.7 10*3/uL (ref 1.7–7.7)
Neutrophils Relative %: 87 %
Platelet Count: 318 10*3/uL (ref 150–400)
RBC: 3.86 MIL/uL — ABNORMAL LOW (ref 3.87–5.11)
RDW: 13.2 % (ref 11.5–15.5)
WBC Count: 8.8 10*3/uL (ref 4.0–10.5)
nRBC: 0 % (ref 0.0–0.2)

## 2019-10-24 LAB — RESEARCH LABS

## 2019-10-24 MED ORDER — SODIUM CHLORIDE 0.9 % IV SOLN
Freq: Once | INTRAVENOUS | Status: AC
  Filled 2019-10-24: qty 250

## 2019-10-24 MED ORDER — SODIUM CHLORIDE 0.9% FLUSH
10.0000 mL | INTRAVENOUS | Status: DC | PRN
  Administered 2019-10-24: 10 mL
  Filled 2019-10-24: qty 10

## 2019-10-24 MED ORDER — HEPARIN SOD (PORK) LOCK FLUSH 100 UNIT/ML IV SOLN
500.0000 [IU] | Freq: Once | INTRAVENOUS | Status: AC | PRN
  Administered 2019-10-24: 500 [IU]
  Filled 2019-10-24: qty 5

## 2019-10-24 MED ORDER — DIPHENHYDRAMINE HCL 50 MG/ML IJ SOLN
INTRAMUSCULAR | Status: AC
  Filled 2019-10-24: qty 1

## 2019-10-24 MED ORDER — PALONOSETRON HCL INJECTION 0.25 MG/5ML
0.2500 mg | Freq: Once | INTRAVENOUS | Status: AC
  Administered 2019-10-24: 0.25 mg via INTRAVENOUS

## 2019-10-24 MED ORDER — SODIUM CHLORIDE 0.9 % IV SOLN
140.0000 mg/m2 | Freq: Once | INTRAVENOUS | Status: AC
  Administered 2019-10-24: 294 mg via INTRAVENOUS
  Filled 2019-10-24: qty 49

## 2019-10-24 MED ORDER — DIPHENHYDRAMINE HCL 50 MG/ML IJ SOLN
25.0000 mg | Freq: Once | INTRAMUSCULAR | Status: AC
  Administered 2019-10-24: 25 mg via INTRAVENOUS

## 2019-10-24 MED ORDER — ESTRADIOL 0.1 MG/GM VA CREA: 1.0000 | TOPICAL_CREAM | VAGINAL | 12 refills | Status: DC

## 2019-10-24 MED ORDER — PALONOSETRON HCL INJECTION 0.25 MG/5ML
INTRAVENOUS | Status: AC
  Filled 2019-10-24: qty 5

## 2019-10-24 MED ORDER — DEXAMETHASONE SODIUM PHOSPHATE 10 MG/ML IJ SOLN
INTRAMUSCULAR | Status: AC
  Filled 2019-10-24: qty 1

## 2019-10-24 MED ORDER — HEPARIN SOD (PORK) LOCK FLUSH 100 UNIT/ML IV SOLN
500.0000 [IU] | Freq: Once | INTRAVENOUS | Status: DC
  Filled 2019-10-24: qty 5

## 2019-10-24 MED ORDER — DEXAMETHASONE SODIUM PHOSPHATE 10 MG/ML IJ SOLN
10.0000 mg | Freq: Once | INTRAMUSCULAR | Status: AC
  Administered 2019-10-24: 10 mg via INTRAVENOUS

## 2019-10-24 MED ORDER — SODIUM CHLORIDE 0.9% FLUSH
10.0000 mL | INTRAVENOUS | Status: DC | PRN
  Administered 2019-10-24: 10 mL via INTRAVENOUS
  Filled 2019-10-24: qty 10

## 2019-10-24 MED ORDER — SODIUM CHLORIDE 0.9 % IV SOLN
750.0000 mg | Freq: Once | INTRAVENOUS | Status: AC
  Administered 2019-10-24: 750 mg via INTRAVENOUS
  Filled 2019-10-24: qty 75

## 2019-10-24 MED ORDER — FAMOTIDINE IN NACL 20-0.9 MG/50ML-% IV SOLN
20.0000 mg | Freq: Once | INTRAVENOUS | Status: AC
  Administered 2019-10-24: 20 mg via INTRAVENOUS

## 2019-10-24 MED ORDER — SODIUM CHLORIDE 0.9 % IV SOLN
150.0000 mg | Freq: Once | INTRAVENOUS | Status: AC
  Administered 2019-10-24: 150 mg via INTRAVENOUS
  Filled 2019-10-24: qty 150

## 2019-10-24 MED ORDER — FAMOTIDINE IN NACL 20-0.9 MG/50ML-% IV SOLN
INTRAVENOUS | Status: AC
  Filled 2019-10-24: qty 50

## 2019-10-24 MED FILL — ESTRADIOL 0.1 MG/GM CRM: 0.1 | 28 days supply | Qty: 43 | Fill #0

## 2019-10-24 NOTE — Patient Instructions (Signed)
   [facility] Discharge Instructions for Patients Receiving Chemotherapy  Today you received the following chemotherapy agents Taxol and Carboplatin   To help prevent nausea and vomiting after your treatment, we encourage you to take your nausea medication as directed.    If you develop nausea and vomiting that is not controlled by your nausea medication, call the clinic.   BELOW ARE SYMPTOMS THAT SHOULD BE REPORTED IMMEDIATELY:  *FEVER GREATER THAN 100.5 F  *CHILLS WITH OR WITHOUT FEVER  NAUSEA AND VOMITING THAT IS NOT CONTROLLED WITH YOUR NAUSEA MEDICATION  *UNUSUAL SHORTNESS OF BREATH  *UNUSUAL BRUISING OR BLEEDING  TENDERNESS IN MOUTH AND THROAT WITH OR WITHOUT PRESENCE OF ULCERS  *URINARY PROBLEMS  *BOWEL PROBLEMS  UNUSUAL RASH Items with * indicate a potential emergency and should be followed up as soon as possible.  Feel free to call the clinic should you have any questions or concerns. The clinic phone number is [phone].  Please show the CHEMO ALERT CARD at check-in to the Emergency Department and triage nurse.   

## 2019-10-24 NOTE — Telephone Encounter (Signed)
Scheduled appt per 3/22 sch message - pt to get an updated schedule in tx area   

## 2019-10-24 NOTE — Research (Signed)
10/24/19 at 10:59am - Research- S1714 week 8 study notes - S1714, A PROSPECTIVE OBSERVATIONAL COHORT STUDY TO DEVELOP A PREDICTIVE MODEL OFTAXANE-INDUCED PERIPHERAL NEUROPATHY IN CANCER PATIENTS. 8 weeks Visit. Patient presented to the clinic, alone, fortoday'streatment. The research nurse met with the pt upon arrival to the cancer center.  The pt verbalized that she did not have any neuropathy complaints.   PROs:Questionnairesweregiven to patient to complete in clinic prior toher appointments. Collected questionnaires and checked for completeness and accuracy.  Labs: Optional whole blood for research collected today this morning before her chemo infusion.  Physician Assessments:CTCAE and Treatment Burden forms completed and signed by Dr. Alvy Bimler.All patient's questions/concerns addressed by MD.  Treatment: Patient to receive 4th Paclitaxel treatment today. No treatment delays or modifications have occurred. History of Falls:assessment not required at this time point.  Solicited Neuropathy Events: MD completed the Solicited Neuropathy events form. MD stated that "all neuropathy sensations had resolved".   Assessment for Interventions for CIPN:Reviewed with patient andCRFs completed.Patient reports she uses ice for bilateral hands and feet during her paclitaxel treatment for prevention of neuropathy.  Neuropen Assessment:Completed per protocol bythiscertified researchnurse. [place] Assessment:Completed per protocol by this Film/video editor, time and documentationwith assistance from Nutritional therapist, Hendricks Limes.  Timed Get Up and Go Test:Not required at this visit. Plan:Informed patient of next study assessments inapproximately4weeks, for her 12 week assessment, and will be done on same day as treatment.Plan for this visit to bein April.Patient denied having any questions at this time. Patient thanked for her time and contribution to study and was encouraged to  call clinic for any questions or concerns she may have prior to her next appointment. Brion Aliment RN, BSN, CCRP Clinical Research Nurse 10/24/2019 11:09 AM   10/24/2019 at 5:14pm - The pt's Cmax sample was drawn at the end of the pt's taxol infusion this afternoon per protocol.  Revision #3 allows for this sample to be collected in the last 10 minutes of any cycle if it is missed with the 1st cycle.   Brion Aliment RN, BSN, CCRP Clinical Research Nurse 10/24/2019 5:17 PM

## 2019-10-25 ENCOUNTER — Encounter: Payer: Self-pay | Admitting: Hematology and Oncology

## 2019-10-25 DIAGNOSIS — R635 Abnormal weight gain: Secondary | ICD-10-CM | POA: Insufficient documentation

## 2019-10-25 NOTE — Assessment & Plan Note (Signed)
This is improved with recent treatment with Metformin as well as dietary modification It is likely related to side effects of chemotherapy and recent weight gain She will continue Metformin for now

## 2019-10-25 NOTE — Progress Notes (Signed)
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 She is doing very well on current dose of treatment We will proceed to complete 6 cycles of therapy  Weight gain She is very concerned about weight gain She has self discontinue estradiol patch She wants a less aggressive approach for hormone replacement I recommend a trial of Estrace cream as needed to prevent vagina dryness and she has agreed to try  Elevated liver enzymes This is improved with recent treatment with Metformin as well as dietary modification It is likely related to side effects of chemotherapy and recent weight gain She will continue Metformin for now   Orders Placed This Encounter  Procedures  . CA 125    Standing Status:   Standing    Number of Occurrences:   3    Standing Expiration Date:   10/23/2020    All questions were answered. The patient knows to call the clinic with any problems, questions or concerns. The total time spent in the appointment was 20 minutes encounter with patients including review of chart and various tests results, discussions about plan of care and coordination of care plan   Heath Lark, MD 10/25/2019 8:40 AM  INTERVAL HISTORY: Please see below for problem oriented charting. She returns for chemotherapy She denies nausea, constipation or any residual signs or symptoms of peripheral neuropathy Her only concern is recent weight gain She has self discontinue estrogen replacement therapy patch because from her previous experience, birth control pill has caused significant weight gain No recent infection, fever or chills She has some fatigue with work   SUMMARY OF ONCOLOGIC HISTORY: Oncology History Overview Note  Clear cell features Negative genetics   Malignant neoplasm of left ovary  ([place])  07/02/2019 Imaging   Ct scan of abdomen and pelvis 1. There is a 16 cm complex cystic mass in the pelvis containing thickened septations located near midline, abutting the superior aspect of the uterus. I suspect this mass is adnexal/ovarian in origin rather than uterine in origin. Specifically, I suspect the mass likely arises from the left ovary/adnexa and is most consistent with a neoplasm. Malignancy is certainly not excluded on this study. Recommend a pelvic ultrasound for further evaluation. 2. The rounded more solid-appearing structure in the right side of the pelvis is probably the right ovary. Recommend attention on ultrasound. 3. The small amount of fluid in the pelvis is likely reactive to the complex cystic mass likely rising from the left ovary/adnexa. 4. No cause for blood in vomit or black stools identified. No convincing evidence of a perforated or inflamed gastric or duodenal ulcer. 5. Hepatic steatosis.   07/02/2019 Imaging   MRI pelvis 1. 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 There are 2 enhancing nodules within the central upper abdomen raising the possibility of peritoneal carcinomatosis. Additionally, there is a small amount fluid within the pelvis. Malignant ascites not excluded. 2. Fibroid uterus. 3. Hepatic steatosis.   07/02/2019 Imaging   US pelvis 1. There is a large mass in the pelvis also seen on CT imaging. A separate left ovary is not visualized. 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 The mass is suspicious for malignancy. Recommend  gynecologic consultation. 2. The right ovary demonstrates an irregular ill-defined hypoechoic hypervascular region centrally. The findings are nonspecific in the right ovary. However, given the apparent left ovarian neoplasm suspicious for malignancy, recommend an MRI to further assess the  right ovary. 3. Uterine fibroid measuring 3.6 cm.     07/21/2019 Pathology Results   FINAL MICROSCOPIC DIAGNOSIS: A. 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 - See oncology table. B. OMENTUM, RESECTION: - Omental lymph node with metastatic carcinoma (1/1). - Omental adipose tissue with foci of inflammation and reactive mesothelial changes. C. FALLOPIAN TUBE, LEFT, RESECTION: - Fallopian tube with focal, mild inflammation and fibrosis. - No tumor identified. D. UTERUS, CERVIX, RIGHT TUBE AND OVARY: - Cervix Nabothian cyst and squamous metaplasia. - Endometrium Proliferative. No hyperplasia or carcinoma. - Myometrium Leiomyomata. No malignancy. -Right ovary Poorly differentiated carcinoma, 4.8 cm. See oncology table and comment. -Right Fallopian tube Benign paratubal cyst. No endometriosis or malignancy. ONCOLOGY TABLE: OVARY or FALLOPIAN TUBE or PRIMARY PERITONEUM: Procedure: Bilateral f-oophorectomy, hysterectomy and omentectomy. Specimen Integrity: Intact Tumor Site: Left ovary and right ovary. See comment. Ovarian Surface Involvement: Not identified. Fallopian Tube Surface Involvement: Not identified. Tumor Size: Left ovary: 18.6 x 16.0 x 7.2 cm. Right ovary: 4.8 x 3.2 x 3.2 cm. Histologic Type: Clear cell carcinoma. See comment. Histologic Grade: High-grade. Other Tissue/ Organ Involvement: Omental lymph node with metastatic carcinoma. Peritoneal/Ascitic Fluid: Pending. Treatment Effect: No known presurgical therapy. Pathologic Stage Classification (pTNM, AJCC 8th Edition): pT3a, pN1a. Representative Tumor Block: A2, A3, A4, A5 A6, A7 and A8. Comment(s): The left ovarian tumor is 18.6 cm in greatest dimension and has features of clear cell carcinoma. The right ovarian tumor is 4.8 cm in greatest dimension and is poorly differentiated with focal features consistent with clear cell carcinoma. The pattern of involvement suggests that  the right ovarian tumor is likely metastatic from the larger left ovarian clear cell carcinoma. Sections of the omentum show a lymph node with metastatic carcinoma. Sections of the remainder of the omentum do not show involvement by carcinoma.   07/21/2019 Surgery   Procedure(s) Performed: Exploratory laparotomy with radical tumor debulking including total hysterectomy, bilateral salpingo-oophorectomy, infra-gastric omentectomy, and washings.   Surgeon: Jeral Pinch, MD    Operative Findings: On EUA, mobile uterus and ovarian mass, situated out of the pelvis, spanning 6-8 cm above the umbilicus.  On intra-abdominal entry, inflamed omentum adherent to much of the 16 cm left ovarian mass, adhesions easily lysed bluntly.  No nodularity of the ovarian mass however on delivery of the mass through the midline incision, there was Intra-Op rupture of one of the smaller cystic components with drainage of clear brown-tinged fluid.  Grossly normal-appearing left fallopian tube as well as right tube.  Right ovary mildly enlarged measuring approximately 4 cm and firm/nodular, suspicious for tumor involvement.  Uterus 8-10 cm with 4 cm anterior mid body fibroid.  Some very small volume miliary disease along right pelvic sidewall, removed with uterine specimen.  Evidence of small diverticular disease.  Small bowel normal appearing although multiple loops of bowel adherent to each other, especially by mesentery, and an inflammatory manner.  An approximately 2 x 2 centimeter nodule suspicious for tumor implant was found in the omentum just inferior to the greater curvature of the stomach.  Additional small (less than 5 mm) implants noted on the stomach and posterior aspect of the left lobe of the liver.  Otherwise the liver surface and diaphragm were smooth.  08/03/2019 Cancer Staging   Staging form: Ovary, Fallopian Tube, and Primary Peritoneal Carcinoma, AJCC 8th Edition - Pathologic: Stage IIIA2 (pT3a, pN1, cM0) -  Signed by Heath Lark, MD on 08/03/2019   08/12/2019 Procedure   Placement of single lumen port a cath via right internal jugular vein. The catheter tip lies at the cavo-atrial junction. A power injectable port a cath was placed and is ready for immediate use.   08/22/2019 -  Chemotherapy   The patient had carboplatin and taxol for chemotherapy treatment.     09/23/2019 Genetic Testing   Negative genetic testing:  No pathogenic variants detected on the [facility] TumorNext-HRD+CancerNext paired germline and somatic genetic test. The report date is 09/23/2019.  The TumorNext-HRD+CancerNext test offered by [facility] includes paired germline and tumor analyses of 11 genes associated with homologous recombination repair (ATM, BARD1, BRIP1, CHEK2, MRE11A, NBN, PALB2, RAD51C, RAD51D, BRCA1, BRCA2) plus germline analyses of 26 additional genes associated with hereditary cancer (APC, AXIN2, BMPR1A, CDH1, CDK4, CDKN2A, DICER1, HOXB13, EPCAM, GREM1, MLH1, MSH2, MSH3, MSH6, MUTYH, NF1, NTHL1, PMS2, POLD1, POLE, PTEN, RECQL, SMAD4, SMARCA4, STK11, and TP53).     REVIEW OF SYSTEMS:   Constitutional: Denies fevers, chills or abnormal weight loss Eyes: Denies blurriness of vision Ears, nose, mouth, throat, and face: Denies mucositis or sore throat Respiratory: Denies cough, dyspnea or wheezes Cardiovascular: Denies palpitation, chest discomfort or lower extremity swelling Gastrointestinal:  Denies nausea, heartburn or change in bowel habits Skin: Denies abnormal skin rashes Lymphatics: Denies new lymphadenopathy or easy bruising Neurological:Denies numbness, tingling or new weaknesses Behavioral/Psych: Mood is stable, no new changes  All other systems were reviewed with the patient and are negative.  I have reviewed the past medical history, past surgical history, social history and family history with the patient and they are unchanged from previous note.  ALLERGIES:  has No Known Allergies.  MEDICATIONS:   Current Outpatient Medications  Medication Sig Dispense Refill  . acetaminophen (TYLENOL) 500 MG tablet Take 1,000 mg by mouth every 6 (six) hours as needed for moderate pain.     Marland Kitchen albuterol (PROVENTIL HFA;VENTOLIN HFA) 108 (90 Base) MCG/ACT inhaler Inhale 2 puffs into the lungs every 4 (four) hours as needed for wheezing or shortness of breath.    . dexamethasone (DECADRON) 4 MG tablet Take 2 tabs at the night before and 2 tabs the morning of chemotherapy, every 3 weeks, by mouth, please dispense 24 tabs for total 6 cycles of treatment (Patient not taking: Reported on 08/12/2019) 24 tablet 5  . estradiol (CLIMARA) 0.05 mg/24hr patch Place 1 patch (0.05 mg total) onto the skin once a week. 4 patch 12  . estradiol (ESTRACE VAGINAL) 0.1 MG/GM vaginal cream Place 1 Applicatorful vaginally 3 (three) times a week. 42.5 g 12  . famotidine (PEPCID) 20 MG tablet Take 1 tablet (20 mg total) by mouth 2 (two) times daily. 30 tablet 0  . ibuprofen (ADVIL) 800 MG tablet Take 1 tablet (800 mg total) by mouth every 8 (eight) hours as needed for moderate pain. For AFTER surgery only (Patient not taking: Reported on 08/12/2019) 30 tablet 1  . lidocaine-prilocaine (EMLA) cream Apply to affected area once 30 g 3  . metFORMIN (GLUCOPHAGE) 500 MG tablet Take 1 tablet (500 mg total) by mouth 2 (two) times daily with a meal. 60 tablet 1  . Multiple Vitamin (MULTIVITAMIN WITH MINERALS) TABS tablet Take 1 tablet by mouth daily.    Marland Kitchen omeprazole (PRILOSEC) 20 MG capsule Take 40 mg by  mouth daily.    . ondansetron (ZOFRAN) 8 MG tablet Take 1 tablet (8 mg total) by mouth every 8 (eight) hours as needed for refractory nausea / vomiting. (Patient not taking: Reported on 08/12/2019) 30 tablet 1  . oxyCODONE (OXY IR/ROXICODONE) 5 MG immediate release tablet Take 1 tablet (5 mg total) by mouth every 4 (four) hours as needed for severe pain. Do not take and drive 20 tablet 0  . prochlorperazine (COMPAZINE) 10 MG tablet Take 1 tablet (10  mg total) by mouth every 6 (six) hours as needed (Nausea or vomiting). 30 tablet 1   No current facility-administered medications for this visit.    PHYSICAL EXAMINATION: ECOG PERFORMANCE STATUS: 0 - Asymptomatic  Vitals:   10/24/19 0850  BP: (!) 131/93  Pulse: 88  Resp: 18  Temp: 98 F (36.7 C)  SpO2: 98%   Filed Weights   10/24/19 0850  Weight: 228 lb 6.4 oz (103.6 kg)    GENERAL:alert, no distress and comfortable SKIN: skin color, texture, turgor are normal, no rashes or significant lesions EYES: normal, Conjunctiva are pink and non-injected, sclera clear OROPHARYNX:no exudate, no erythema and lips, buccal mucosa, and tongue normal  NECK: supple, thyroid normal size, non-tender, without nodularity LYMPH:  no palpable lymphadenopathy in the cervical, axillary or inguinal LUNGS: clear to auscultation and percussion with normal breathing effort HEART: regular rate & rhythm and no murmurs and no lower extremity edema ABDOMEN:abdomen soft, non-tender and normal bowel sounds Musculoskeletal:no cyanosis of digits and no clubbing  NEURO: alert & oriented x 3 with fluent speech, no focal motor/sensory deficits  LABORATORY DATA:  I have reviewed the data as listed    Component Value Date/Time   NA 139 10/24/2019 0835   K 3.9 10/24/2019 0835   CL 106 10/24/2019 0835   CO2 23 10/24/2019 0835   GLUCOSE 176 (H) 10/24/2019 0835   BUN 11 10/24/2019 0835   CREATININE 0.70 10/24/2019 0835   CALCIUM 9.6 10/24/2019 0835   PROT 7.6 10/24/2019 0835   ALBUMIN 3.8 10/24/2019 0835   AST 40 10/24/2019 0835   ALT 59 (H) 10/24/2019 0835   ALKPHOS 122 10/24/2019 0835   BILITOT <0.2 (L) 10/24/2019 0835   GFRNONAA >60 10/24/2019 0835   GFRAA >60 10/24/2019 0835    No results found for: SPEP, UPEP  Lab Results  Component Value Date   WBC 8.8 10/24/2019   NEUTROABS 7.7 10/24/2019   HGB 10.6 (L) 10/24/2019   HCT 32.6 (L) 10/24/2019   MCV 84.5 10/24/2019   PLT 318 10/24/2019       Chemistry      Component Value Date/Time   NA 139 10/24/2019 0835   K 3.9 10/24/2019 0835   CL 106 10/24/2019 0835   CO2 23 10/24/2019 0835   BUN 11 10/24/2019 0835   CREATININE 0.70 10/24/2019 0835      Component Value Date/Time   CALCIUM 9.6 10/24/2019 0835   ALKPHOS 122 10/24/2019 0835   AST 40 10/24/2019 0835   ALT 59 (H) 10/24/2019 0835   BILITOT <0.2 (L) 10/24/2019 5868

## 2019-10-25 NOTE — Assessment & Plan Note (Addendum)
She is very concerned about weight gain She has self discontinue estradiol patch She wants a less aggressive approach for hormone replacement I recommend a trial of Estrace cream as needed to prevent vagina dryness and she has agreed to try

## 2019-10-25 NOTE — Assessment & Plan Note (Signed)
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

## 2019-11-01 MED FILL — ESTRADIOL 0.05 MG/DAY PATCH: 0.05 | 28 days supply | Qty: 4 | Fill #2

## 2019-11-04 MED FILL — METFORMIN HCL 500 MG TABS: 500 | 30 days supply | Qty: 60 | Fill #1

## 2019-11-08 NOTE — Progress Notes (Signed)
Pharmacist Chemotherapy Monitoring - Follow Up Assessment    I verify that I have reviewed each item in the below checklist:  . Regimen for the patient is scheduled for the appropriate day and plan matches scheduled date. Marland Kitchen Appropriate non-routine labs are ordered dependent on drug ordered. . If applicable, additional medications reviewed and ordered per protocol based on lifetime cumulative doses and/or treatment regimen.   Plan for follow-up and/or issues identified: No . I-vent associated with next due treatment: No . MD and/or nursing notified: No  Acquanetta Belling 11/08/2019 8:39 AM

## 2019-11-14 ENCOUNTER — Encounter: Payer: Self-pay | Admitting: Hematology and Oncology

## 2019-11-14 ENCOUNTER — Other Ambulatory Visit: Payer: Self-pay

## 2019-11-14 ENCOUNTER — Inpatient Hospital Stay: Payer: BC Managed Care – PPO

## 2019-11-14 ENCOUNTER — Inpatient Hospital Stay: Payer: BC Managed Care – PPO | Attending: Hematology and Oncology

## 2019-11-14 ENCOUNTER — Encounter: Payer: Self-pay | Admitting: *Deleted

## 2019-11-14 ENCOUNTER — Inpatient Hospital Stay (HOSPITAL_BASED_OUTPATIENT_CLINIC_OR_DEPARTMENT_OTHER): Payer: BC Managed Care – PPO | Admitting: Hematology and Oncology

## 2019-11-14 DIAGNOSIS — Z79899 Other long term (current) drug therapy: Secondary | ICD-10-CM

## 2019-11-14 DIAGNOSIS — Z5111 Encounter for antineoplastic chemotherapy: Secondary | ICD-10-CM | POA: Diagnosis present

## 2019-11-14 DIAGNOSIS — Z006 Encounter for examination for normal comparison and control in clinical research program: Secondary | ICD-10-CM | POA: Diagnosis not present

## 2019-11-14 DIAGNOSIS — Z79890 Hormone replacement therapy (postmenopausal): Secondary | ICD-10-CM | POA: Diagnosis not present

## 2019-11-14 DIAGNOSIS — C562 Malignant neoplasm of left ovary: Secondary | ICD-10-CM | POA: Insufficient documentation

## 2019-11-14 DIAGNOSIS — M898X9 Other specified disorders of bone, unspecified site: Secondary | ICD-10-CM

## 2019-11-14 DIAGNOSIS — E8941 Symptomatic postprocedural ovarian failure: Secondary | ICD-10-CM | POA: Insufficient documentation

## 2019-11-14 LAB — CBC WITH DIFFERENTIAL (CANCER CENTER ONLY)
Abs Immature Granulocytes: 0.08 10*3/uL — ABNORMAL HIGH (ref 0.00–0.07)
Basophils Absolute: 0 10*3/uL (ref 0.0–0.1)
Basophils Relative: 0 %
Eosinophils Absolute: 0 10*3/uL (ref 0.0–0.5)
Eosinophils Relative: 0 %
HCT: 32.4 % — ABNORMAL LOW (ref 36.0–46.0)
Hemoglobin: 10.6 g/dL — ABNORMAL LOW (ref 12.0–15.0)
Immature Granulocytes: 1 %
Lymphocytes Relative: 11 %
Lymphs Abs: 0.9 10*3/uL (ref 0.7–4.0)
MCH: 27.3 pg (ref 26.0–34.0)
MCHC: 32.7 g/dL (ref 30.0–36.0)
MCV: 83.5 fL (ref 80.0–100.0)
Monocytes Absolute: 0.1 10*3/uL (ref 0.1–1.0)
Monocytes Relative: 1 %
Neutro Abs: 7.2 10*3/uL (ref 1.7–7.7)
Neutrophils Relative %: 87 %
Platelet Count: 307 10*3/uL (ref 150–400)
RBC: 3.88 MIL/uL (ref 3.87–5.11)
RDW: 13.5 % (ref 11.5–15.5)
WBC Count: 8.3 10*3/uL (ref 4.0–10.5)
nRBC: 0 % (ref 0.0–0.2)

## 2019-11-14 LAB — CMP (CANCER CENTER ONLY)
ALT: 34 U/L (ref 0–44)
AST: 27 U/L (ref 15–41)
Albumin: 3.9 g/dL (ref 3.5–5.0)
Alkaline Phosphatase: 112 U/L (ref 38–126)
Anion gap: 10 (ref 5–15)
BUN: 11 mg/dL (ref 6–20)
CO2: 24 mmol/L (ref 22–32)
Calcium: 9.5 mg/dL (ref 8.9–10.3)
Chloride: 105 mmol/L (ref 98–111)
Creatinine: 0.73 mg/dL (ref 0.44–1.00)
GFR, Est AFR Am: >60 mL/min (ref >60)
GFR, Estimated: >60 mL/min (ref >60)
Glucose, Bld: 194 mg/dL — ABNORMAL HIGH (ref 70–99)
Potassium: 4 mmol/L (ref 3.5–5.1)
Sodium: 139 mmol/L (ref 135–145)
Total Bilirubin: <0.2 mg/dL — ABNORMAL LOW (ref 0.3–1.2)
Total Protein: 7.9 g/dL (ref 6.5–8.1)

## 2019-11-14 LAB — RESEARCH LABS

## 2019-11-14 MED ORDER — SODIUM CHLORIDE 0.9% FLUSH
10.0000 mL | INTRAVENOUS | Status: DC | PRN
  Administered 2019-11-14: 10 mL
  Filled 2019-11-14: qty 10

## 2019-11-14 MED ORDER — SODIUM CHLORIDE 0.9 % IV SOLN
750.0000 mg | Freq: Once | INTRAVENOUS | Status: AC
  Administered 2019-11-14: 750 mg via INTRAVENOUS
  Filled 2019-11-14: qty 75

## 2019-11-14 MED ORDER — PALONOSETRON HCL INJECTION 0.25 MG/5ML
0.2500 mg | Freq: Once | INTRAVENOUS | Status: AC
  Administered 2019-11-14: 0.25 mg via INTRAVENOUS

## 2019-11-14 MED ORDER — HEPARIN SOD (PORK) LOCK FLUSH 100 UNIT/ML IV SOLN
500.0000 [IU] | Freq: Once | INTRAVENOUS | Status: AC | PRN
  Administered 2019-11-14: 500 [IU]
  Filled 2019-11-14: qty 5

## 2019-11-14 MED ORDER — SODIUM CHLORIDE 0.9 % IV SOLN
140.0000 mg/m2 | Freq: Once | INTRAVENOUS | Status: AC
  Administered 2019-11-14: 294 mg via INTRAVENOUS
  Filled 2019-11-14: qty 49

## 2019-11-14 MED ORDER — SODIUM CHLORIDE 0.9 % IV SOLN
150.0000 mg | Freq: Once | INTRAVENOUS | Status: AC
  Administered 2019-11-14: 150 mg via INTRAVENOUS
  Filled 2019-11-14: qty 150

## 2019-11-14 MED ORDER — DIPHENHYDRAMINE HCL 50 MG/ML IJ SOLN
25.0000 mg | Freq: Once | INTRAMUSCULAR | Status: AC
  Administered 2019-11-14: 25 mg via INTRAVENOUS

## 2019-11-14 MED ORDER — PALONOSETRON HCL INJECTION 0.25 MG/5ML
INTRAVENOUS | Status: AC
  Filled 2019-11-14: qty 5

## 2019-11-14 MED ORDER — SODIUM CHLORIDE 0.9 % IV SOLN
10.0000 mg | Freq: Once | INTRAVENOUS | Status: AC
  Administered 2019-11-14: 10 mg via INTRAVENOUS
  Filled 2019-11-14: qty 10

## 2019-11-14 MED ORDER — FAMOTIDINE IN NACL 20-0.9 MG/50ML-% IV SOLN
INTRAVENOUS | Status: AC
  Filled 2019-11-14: qty 50

## 2019-11-14 MED ORDER — FAMOTIDINE IN NACL 20-0.9 MG/50ML-% IV SOLN
20.0000 mg | Freq: Once | INTRAVENOUS | Status: AC
  Administered 2019-11-14: 11:00:00 20 mg via INTRAVENOUS

## 2019-11-14 MED ORDER — SODIUM CHLORIDE 0.9 % IV SOLN
Freq: Once | INTRAVENOUS | Status: AC
  Filled 2019-11-14: qty 250

## 2019-11-14 MED ORDER — DIPHENHYDRAMINE HCL 50 MG/ML IJ SOLN
INTRAMUSCULAR | Status: AC
  Filled 2019-11-14: qty 1

## 2019-11-14 NOTE — Assessment & Plan Note (Signed)
This is stable She will take pain medicine as needed

## 2019-11-14 NOTE — Assessment & Plan Note (Signed)
She does like estrogen cream She has residual estrogen replacement therapy with estrogen patch

## 2019-11-14 NOTE — Assessment & Plan Note (Signed)
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

## 2019-11-14 NOTE — Patient Instructions (Signed)
[facility] Discharge Instructions for Patients Receiving Chemotherapy  Today you received the following chemotherapy agents Taxol, Carboplatin  To help prevent nausea and vomiting after your treatment, we encourage you to take your nausea medication as directed  If you develop nausea and vomiting that is not controlled by your nausea medication, call the clinic.   BELOW ARE SYMPTOMS THAT SHOULD BE REPORTED IMMEDIATELY:  *FEVER GREATER THAN 100.5 F  *CHILLS WITH OR WITHOUT FEVER  NAUSEA AND VOMITING THAT IS NOT CONTROLLED WITH YOUR NAUSEA MEDICATION  *UNUSUAL SHORTNESS OF BREATH  *UNUSUAL BRUISING OR BLEEDING  TENDERNESS IN MOUTH AND THROAT WITH OR WITHOUT PRESENCE OF ULCERS  *URINARY PROBLEMS  *BOWEL PROBLEMS  UNUSUAL RASH Items with * indicate a potential emergency and should be followed up as soon as possible.  Feel free to call the clinic should you have any questions or concerns. The clinic phone number is [phone].  Please show the CHEMO ALERT CARD at check-in to the Emergency Department and triage nurse.   

## 2019-11-14 NOTE — Progress Notes (Signed)
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   All questions were answered. The patient knows to call the clinic with any problems, questions or concerns. The total time spent in the appointment was 20 minutes encounter with patients including review of chart and various tests results, discussions about plan of care and coordination of care plan   Heath Lark, MD 11/14/2019 9:52 AM  INTERVAL HISTORY: Please see below for problem oriented charting. 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 There is a 16 cm complex cystic mass in the pelvis containing thickened septations located near midline, abutting the superior aspect of the uterus. I suspect this mass is adnexal/ovarian in origin rather than uterine in origin.  Specifically, I suspect the mass likely arises from the left ovary/adnexa and is most consistent with a neoplasm. Malignancy is certainly not excluded on this study. Recommend a pelvic ultrasound for further evaluation. 2. The rounded more solid-appearing structure in the right side of the pelvis is probably the right ovary. Recommend attention on ultrasound. 3. The small amount of fluid in the pelvis is likely reactive to the complex cystic mass likely rising from the left ovary/adnexa. 4. No cause for blood in vomit or black stools identified. No convincing evidence of a perforated or inflamed gastric or duodenal ulcer. 5. Hepatic steatosis.   07/02/2019 Imaging   MRI pelvis 1. 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 There are 2 enhancing nodules within the central upper abdomen raising the possibility of peritoneal carcinomatosis. Additionally, there is a small amount fluid within the pelvis. Malignant ascites not excluded. 2. Fibroid uterus. 3. Hepatic steatosis.   07/02/2019 Imaging   US pelvis 1. There is a large mass in the pelvis also seen on CT imaging. A separate left ovary is not visualized. 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 The mass is suspicious for malignancy. Recommend gynecologic consultation. 2. The right ovary demonstrates an irregular ill-defined hypoechoic hypervascular region centrally. The findings are nonspecific in the right ovary. However, given the apparent left ovarian neoplasm suspicious for malignancy, recommend an MRI to further assess the right ovary. 3. Uterine fibroid measuring 3.6 cm.     07/21/2019  Pathology Results   FINAL MICROSCOPIC DIAGNOSIS: A. 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 - See oncology table. B. OMENTUM, RESECTION: - Omental lymph node with metastatic  carcinoma (1/1). - Omental adipose tissue with foci of inflammation and reactive mesothelial changes. C. FALLOPIAN TUBE, LEFT, RESECTION: - Fallopian tube with focal, mild inflammation and fibrosis. - No tumor identified. D. UTERUS, CERVIX, RIGHT TUBE AND OVARY: - Cervix Nabothian cyst and squamous metaplasia. - Endometrium Proliferative. No hyperplasia or carcinoma. - Myometrium Leiomyomata. No malignancy. -Right ovary Poorly differentiated carcinoma, 4.8 cm. See oncology table and comment. -Right Fallopian tube Benign paratubal cyst. No endometriosis or malignancy. ONCOLOGY TABLE: OVARY or FALLOPIAN TUBE or PRIMARY PERITONEUM: Procedure: Bilateral f-oophorectomy, hysterectomy and omentectomy. Specimen Integrity: Intact Tumor Site: Left ovary and right ovary. See comment. Ovarian Surface Involvement: Not identified. Fallopian Tube Surface Involvement: Not identified. Tumor Size: Left ovary: 18.6 x 16.0 x 7.2 cm. Right ovary: 4.8 x 3.2 x 3.2 cm. Histologic Type: Clear cell carcinoma. See comment. Histologic Grade: High-grade. Other Tissue/ Organ Involvement: Omental lymph node with metastatic carcinoma. Peritoneal/Ascitic Fluid: Pending. Treatment Effect: No known presurgical therapy. Pathologic Stage Classification (pTNM, AJCC 8th Edition): pT3a, pN1a. Representative Tumor Block: A2, A3, A4, A5 A6, A7 and A8. Comment(s): The left ovarian tumor is 18.6 cm in greatest dimension and has features of clear cell carcinoma. The right ovarian tumor is 4.8 cm in greatest dimension and is poorly differentiated with focal features consistent with clear cell carcinoma. The pattern of involvement suggests that the right ovarian tumor is likely metastatic from the larger left ovarian clear cell carcinoma. Sections of the omentum show a lymph node with metastatic carcinoma. Sections of the remainder of the omentum do not show involvement by carcinoma.   07/21/2019 Surgery   Procedure(s)  Performed: Exploratory laparotomy with radical tumor debulking including total hysterectomy, bilateral salpingo-oophorectomy, infra-gastric omentectomy, and washings.   Surgeon: Jeral Pinch, MD    Operative Findings: On EUA, mobile uterus and ovarian mass, situated out of the pelvis, spanning 6-8 cm above the umbilicus.  On intra-abdominal entry, inflamed omentum adherent to much of the 16 cm left ovarian mass, adhesions easily lysed bluntly.  No nodularity of the ovarian mass however on delivery of the mass through the midline incision, there was Intra-Op rupture of one of the smaller cystic components with drainage of clear brown-tinged fluid.  Grossly normal-appearing left fallopian tube as well as right tube.  Right ovary mildly enlarged measuring approximately 4 cm and firm/nodular, suspicious for tumor involvement.  Uterus 8-10 cm with 4 cm anterior mid body fibroid.  Some very small volume miliary disease along right pelvic sidewall, removed with uterine specimen.  Evidence of small diverticular disease.  Small bowel normal appearing although multiple loops of bowel adherent to each other, especially by mesentery, and an inflammatory manner.  An approximately 2 x 2 centimeter nodule suspicious for tumor implant was found in the omentum just inferior to the greater curvature of the stomach.  Additional small (less than 5 mm) implants noted on the stomach and posterior aspect of the left lobe of the liver.  Otherwise the liver surface and diaphragm were smooth.   08/03/2019 Cancer Staging   Staging form: Ovary, Fallopian Tube, and Primary Peritoneal Carcinoma, AJCC 8th Edition - Pathologic: Stage IIIA2 (pT3a, pN1, cM0) - Signed by Heath Lark, MD on 08/03/2019   08/12/2019 Procedure   Placement of single lumen port a cath via right internal jugular vein. The catheter  tip lies at the cavo-atrial junction. A power injectable port a cath was placed and is ready for immediate use.   08/22/2019 -   Chemotherapy   The patient had carboplatin and taxol for chemotherapy treatment.     09/23/2019 Genetic Testing   Negative genetic testing:  No pathogenic variants detected on the [facility] TumorNext-HRD+CancerNext paired germline and somatic genetic test. The report date is 09/23/2019.  The TumorNext-HRD+CancerNext test offered by [facility] includes paired germline and tumor analyses of 11 genes associated with homologous recombination repair (ATM, BARD1, BRIP1, CHEK2, MRE11A, NBN, PALB2, RAD51C, RAD51D, BRCA1, BRCA2) plus germline analyses of 26 additional genes associated with hereditary cancer (APC, AXIN2, BMPR1A, CDH1, CDK4, CDKN2A, DICER1, HOXB13, EPCAM, GREM1, MLH1, MSH2, MSH3, MSH6, MUTYH, NF1, NTHL1, PMS2, POLD1, POLE, PTEN, RECQL, SMAD4, SMARCA4, STK11, and TP53).     REVIEW OF SYSTEMS:   Constitutional: Denies fevers, chills or abnormal weight loss Eyes: Denies blurriness of vision Ears, nose, mouth, throat, and face: Denies mucositis or sore throat Respiratory: Denies cough, dyspnea or wheezes Cardiovascular: Denies palpitation, chest discomfort or lower extremity swelling Skin: Denies abnormal skin rashes Lymphatics: Denies new lymphadenopathy or easy bruising Neurological:Denies numbness, tingling or new weaknesses Behavioral/Psych: Mood is stable, no new changes  All other systems were reviewed with the patient and are negative.  I have reviewed the past medical history, past surgical history, social history and family history with the patient and they are unchanged from previous note.  ALLERGIES:  has No Known Allergies.  MEDICATIONS:  Current Outpatient Medications  Medication Sig Dispense Refill  . acetaminophen (TYLENOL) 500 MG tablet Take 1,000 mg by mouth every 6 (six) hours as needed for moderate pain.     Marland Kitchen albuterol (PROVENTIL HFA;VENTOLIN HFA) 108 (90 Base) MCG/ACT inhaler Inhale 2 puffs into the lungs every 4 (four) hours as needed for wheezing or shortness of breath.     . dexamethasone (DECADRON) 4 MG tablet Take 2 tabs at the night before and 2 tabs the morning of chemotherapy, every 3 weeks, by mouth, please dispense 24 tabs for total 6 cycles of treatment (Patient not taking: Reported on 08/12/2019) 24 tablet 5  . estradiol (CLIMARA) 0.05 mg/24hr patch Place 1 patch (0.05 mg total) onto the skin once a week. 4 patch 12  . famotidine (PEPCID) 20 MG tablet Take 1 tablet (20 mg total) by mouth 2 (two) times daily. 30 tablet 0  . ibuprofen (ADVIL) 800 MG tablet Take 1 tablet (800 mg total) by mouth every 8 (eight) hours as needed for moderate pain. For AFTER surgery only (Patient not taking: Reported on 08/12/2019) 30 tablet 1  . lidocaine-prilocaine (EMLA) cream Apply to affected area once 30 g 3  . metFORMIN (GLUCOPHAGE) 500 MG tablet Take 1 tablet (500 mg total) by mouth 2 (two) times daily with a meal. 60 tablet 1  . Multiple Vitamin (MULTIVITAMIN WITH MINERALS) TABS tablet Take 1 tablet by mouth daily.    Marland Kitchen omeprazole (PRILOSEC) 20 MG capsule Take 40 mg by mouth daily.    . ondansetron (ZOFRAN) 8 MG tablet Take 1 tablet (8 mg total) by mouth every 8 (eight) hours as needed for refractory nausea / vomiting. (Patient not taking: Reported on 08/12/2019) 30 tablet 1  . oxyCODONE (OXY IR/ROXICODONE) 5 MG immediate release tablet Take 1 tablet (5 mg total) by mouth every 4 (four) hours as needed for severe pain. Do not take and drive 20 tablet 0  . prochlorperazine (COMPAZINE) 10 MG tablet Take 1  tablet (10 mg total) by mouth every 6 (six) hours as needed (Nausea or vomiting). 30 tablet 1   No current facility-administered medications for this visit.    PHYSICAL EXAMINATION: ECOG PERFORMANCE STATUS: 1 - Symptomatic but completely ambulatory  Vitals:   11/14/19 0934  BP: (!) 141/90  Pulse: 95  Resp: 18  Temp: 98.2 F (36.8 C)  SpO2: 99%   Filed Weights   11/14/19 0934  Weight: 225 lb 6.4 oz (102.2 kg)    GENERAL:alert, no distress and  comfortable Musculoskeletal:no cyanosis of digits and no clubbing  NEURO: alert & oriented x 3 with fluent speech, no focal motor/sensory deficits  LABORATORY DATA:  I have reviewed the data as listed    Component Value Date/Time   NA 139 10/24/2019 0835   K 3.9 10/24/2019 0835   CL 106 10/24/2019 0835   CO2 23 10/24/2019 0835   GLUCOSE 176 (H) 10/24/2019 0835   BUN 11 10/24/2019 0835   CREATININE 0.70 10/24/2019 0835   CALCIUM 9.6 10/24/2019 0835   PROT 7.6 10/24/2019 0835   ALBUMIN 3.8 10/24/2019 0835   AST 40 10/24/2019 0835   ALT 59 (H) 10/24/2019 0835   ALKPHOS 122 10/24/2019 0835   BILITOT <0.2 (L) 10/24/2019 0835   GFRNONAA >60 10/24/2019 0835   GFRAA >60 10/24/2019 0835    No results found for: SPEP, UPEP  Lab Results  Component Value Date   WBC 8.8 10/24/2019   NEUTROABS 7.7 10/24/2019   HGB 10.6 (L) 10/24/2019   HCT 32.6 (L) 10/24/2019   MCV 84.5 10/24/2019   PLT 318 10/24/2019      Chemistry      Component Value Date/Time   NA 139 10/24/2019 0835   K 3.9 10/24/2019 0835   CL 106 10/24/2019 0835   CO2 23 10/24/2019 0835   BUN 11 10/24/2019 0835   CREATININE 0.70 10/24/2019 0835      Component Value Date/Time   CALCIUM 9.6 10/24/2019 0835   ALKPHOS 122 10/24/2019 0835   AST 40 10/24/2019 0835   ALT 59 (H) 10/24/2019 0835   BILITOT <0.2 (L) 10/24/2019 0034

## 2019-11-14 NOTE — Research (Signed)
11/14/19 at 10:47am - S1714 week 12 study notes  S1714, A PROSPECTIVE OBSERVATIONAL COHORT STUDY TO DEVELOP A PREDICTIVE MODEL OFTAXANE-INDUCED PERIPHERAL NEUROPATHY IN CANCER PATIENTS. 12 weeks Visit. Patient presented to the clinic, alone, fortoday'streatment. The research nurse met with the pt upon arrival to the cancer center.  The pt verbalized that she did not have any neuropathy complaints.   PROs:Questionnairesweregiven to patient to complete in clinic prior toher appointments. Collected questionnaires and checked for completeness and accuracy. Labs:Optional whole blood for research collected today this morning before her chemo infusion.  Physician Assessments:CTCAE and Treatment Burden forms completed and signed by Dr.Gorsuch.All patient's questions/concerns addressed by MD.  Treatment: Patient to receive 5thPaclitaxeltreatment today. No treatment delays have occurred.  Paclitaxel being given at a reduced dose of 140 mg/m2.   History of Falls:assessment not required at this time point.  Solicited Neuropathy Events:MD completed the Solicited Neuropathy events form. MD stated "she has complete resolution of neuropathy".   Assessment for Interventions for CIPN:Reviewed with patient andCRFs completed.Patient reports sheusesice for bilateral hands and feet during her paclitaxel treatment for preventionof neuropathy. The denied using the following medications:  Anti-depressants, Gabapentin, Narcotics, Tramadol, Duloxetine, and Ibuprofen.  The pt reported using 1 Tylenol (500 mg) 2 weeks prior for a headache (not for neuropathy symptoms).  She said that she doesn't take Tylenol weekly.  She denied taking Fish Oil  She said that she is still taking her [employer] "One a Day" Women's vitamin with vitamin E, B6, B12.  She denies using topical agents.  She does use CBD (inhaled) oil after chemo for 1 week to promote sleep and wellness.  She also uses ice bath during her chemo treatments  for neuropathy prevention.  She states she uses Claritin for bone pain prevention.  The pt believes all of these interventions are helping her.    Neuropen Assessment:Completed per protocol bythiscertified researchnurse. [place] Assessment:Completed per protocol by this Film/video editor, time and documentationwith assistance from Nutritional therapist, Hendricks Limes.  Timed Get Up and Go Test:Not required at this visit. Plan:Informed patient of next study assessments inapproximately12weeks, for her 24 week assessment.  Plan for this visit to bein July.The research nurse will meet with the pt on 12/05/19 to schedule/coordinate her week 24 visit.  Patient denied having any questions at this time. Patient thanked for her time and contribution to study and was encouraged to call clinic for any questions or concerns she may have prior to her next appointment. Brion Aliment RN, BSN, CCRP Clinical Research Nurse 11/14/2019 10:51 AM

## 2019-11-15 ENCOUNTER — Telehealth: Payer: Self-pay | Admitting: Hematology and Oncology

## 2019-11-15 LAB — CA 125: Cancer Antigen (CA) 125: 10.2 U/mL (ref 0.0–38.1)

## 2019-11-15 NOTE — Telephone Encounter (Signed)
No 4/12 los or sch msg. No changes made to pt's schedule.

## 2019-11-25 ENCOUNTER — Emergency Department (HOSPITAL_COMMUNITY)
Admission: EM | Admit: 2019-11-25 | Discharge: 2019-11-25 | Disposition: A | Discharge status: Home / Self Care | Payer: BC Managed Care – PPO | Attending: Emergency Medicine | Admitting: Emergency Medicine

## 2019-11-25 ENCOUNTER — Encounter (HOSPITAL_COMMUNITY): Payer: Self-pay | Admitting: Emergency Medicine

## 2019-11-25 ENCOUNTER — Other Ambulatory Visit: Payer: Self-pay

## 2019-11-25 ENCOUNTER — Emergency Department (HOSPITAL_COMMUNITY): Payer: BC Managed Care – PPO

## 2019-11-25 DIAGNOSIS — Z9221 Personal history of antineoplastic chemotherapy: Secondary | ICD-10-CM | POA: Diagnosis not present

## 2019-11-25 DIAGNOSIS — C562 Malignant neoplasm of left ovary: Secondary | ICD-10-CM | POA: Diagnosis not present

## 2019-11-25 DIAGNOSIS — R42 Dizziness and giddiness: Secondary | ICD-10-CM | POA: Insufficient documentation

## 2019-11-25 DIAGNOSIS — K279 Peptic ulcer, site unspecified, unspecified as acute or chronic, without hemorrhage or perforation: Secondary | ICD-10-CM

## 2019-11-25 DIAGNOSIS — Z7984 Long term (current) use of oral hypoglycemic drugs: Secondary | ICD-10-CM | POA: Insufficient documentation

## 2019-11-25 DIAGNOSIS — R110 Nausea: Secondary | ICD-10-CM | POA: Diagnosis present

## 2019-11-25 DIAGNOSIS — J45909 Unspecified asthma, uncomplicated: Secondary | ICD-10-CM | POA: Diagnosis not present

## 2019-11-25 DIAGNOSIS — R1013 Epigastric pain: Secondary | ICD-10-CM

## 2019-11-25 DIAGNOSIS — R519 Headache, unspecified: Secondary | ICD-10-CM | POA: Diagnosis not present

## 2019-11-25 LAB — CBC WITH DIFFERENTIAL/PLATELET
Abs Immature Granulocytes: 0.01 10*3/uL (ref 0.00–0.07)
Basophils Absolute: 0 10*3/uL (ref 0.0–0.1)
Basophils Relative: 0 %
Eosinophils Absolute: 0.1 10*3/uL (ref 0.0–0.5)
Eosinophils Relative: 2 %
HCT: 29.2 % — ABNORMAL LOW (ref 36.0–46.0)
Hemoglobin: 9.4 g/dL — ABNORMAL LOW (ref 12.0–15.0)
Immature Granulocytes: 0 %
Lymphocytes Relative: 35 %
Lymphs Abs: 1.3 10*3/uL (ref 0.7–4.0)
MCH: 27.8 pg (ref 26.0–34.0)
MCHC: 32.2 g/dL (ref 30.0–36.0)
MCV: 86.4 fL (ref 80.0–100.0)
Monocytes Absolute: 0.4 10*3/uL (ref 0.1–1.0)
Monocytes Relative: 10 %
Neutro Abs: 2 10*3/uL (ref 1.7–7.7)
Neutrophils Relative %: 53 %
Platelets: 250 10*3/uL (ref 150–400)
RBC: 3.38 MIL/uL — ABNORMAL LOW (ref 3.87–5.11)
RDW: 13.8 % (ref 11.5–15.5)
WBC: 3.8 10*3/uL — ABNORMAL LOW (ref 4.0–10.5)
nRBC: 0 % (ref 0.0–0.2)

## 2019-11-25 LAB — COMPREHENSIVE METABOLIC PANEL
ALT: 28 U/L (ref 0–44)
AST: 25 U/L (ref 15–41)
Albumin: 3.9 g/dL (ref 3.5–5.0)
Alkaline Phosphatase: 74 U/L (ref 38–126)
Anion gap: 10 (ref 5–15)
BUN: 11 mg/dL (ref 6–20)
CO2: 28 mmol/L (ref 22–32)
Calcium: 9.1 mg/dL (ref 8.9–10.3)
Chloride: 103 mmol/L (ref 98–111)
Creatinine, Ser: 0.53 mg/dL (ref 0.44–1.00)
GFR calc Af Amer: >60 mL/min (ref >60)
GFR calc non Af Amer: >60 mL/min (ref >60)
Glucose, Bld: 101 mg/dL — ABNORMAL HIGH (ref 70–99)
Potassium: 3.1 mmol/L — ABNORMAL LOW (ref 3.5–5.1)
Sodium: 141 mmol/L (ref 135–145)
Total Bilirubin: <0.1 mg/dL — ABNORMAL LOW (ref 0.3–1.2)
Total Protein: 7.5 g/dL (ref 6.5–8.1)

## 2019-11-25 LAB — RETICULOCYTES
Immature Retic Fract: 15.2 % (ref 2.3–15.9)
RBC.: 3.37 MIL/uL — ABNORMAL LOW (ref 3.87–5.11)
Retic Count, Absolute: 84.3 10*3/uL (ref 19.0–186.0)
Retic Ct Pct: 2.5 % (ref 0.4–3.1)

## 2019-11-25 LAB — TYPE AND SCREEN
ABO/RH(D): O POS
Antibody Screen: NEGATIVE

## 2019-11-25 LAB — PROTIME-INR
INR: 0.9 (ref 0.8–1.2)
Prothrombin Time: 12.1 seconds (ref 11.4–15.2)

## 2019-11-25 LAB — LIPASE, BLOOD: Lipase: 34 U/L (ref 11–51)

## 2019-11-25 MED ORDER — METOCLOPRAMIDE HCL 5 MG/ML IJ SOLN
10.0000 mg | Freq: Once | INTRAMUSCULAR | Status: AC
  Administered 2019-11-25: 10 mg via INTRAVENOUS
  Filled 2019-11-25: qty 2

## 2019-11-25 MED ORDER — MORPHINE SULFATE (PF) 4 MG/ML IV SOLN
8.0000 mg | Freq: Once | INTRAVENOUS | Status: AC
  Administered 2019-11-25: 8 mg via INTRAVENOUS
  Filled 2019-11-25: qty 2

## 2019-11-25 MED ORDER — PANTOPRAZOLE SODIUM 40 MG IV SOLR
40.0000 mg | Freq: Once | INTRAVENOUS | Status: AC
  Administered 2019-11-25: 40 mg via INTRAVENOUS
  Filled 2019-11-25: qty 40

## 2019-11-25 MED ORDER — ONDANSETRON HCL 4 MG/2ML IJ SOLN
4.0000 mg | Freq: Once | INTRAMUSCULAR | Status: AC
  Administered 2019-11-25: 4 mg via INTRAVENOUS
  Filled 2019-11-25: qty 2

## 2019-11-25 MED ORDER — FAMOTIDINE 20 MG PO TABS
20.0000 mg | ORAL_TABLET | Freq: Two times a day (BID) | ORAL | 1 refills | Status: DC
Start: 2019-11-25 — End: 2020-08-29

## 2019-11-25 MED ORDER — SUCRALFATE 1 G PO TABS
1.0000 g | ORAL_TABLET | Freq: Once | ORAL | Status: AC
  Administered 2019-11-25: 1 g via ORAL
  Filled 2019-11-25: qty 1

## 2019-11-25 MED ORDER — KETOROLAC TROMETHAMINE 30 MG/ML IJ SOLN
15.0000 mg | Freq: Once | INTRAMUSCULAR | Status: AC
  Administered 2019-11-25: 15 mg via INTRAVENOUS
  Filled 2019-11-25: qty 1

## 2019-11-25 MED ORDER — SUCRALFATE 1 G PO TABS
1.0000 g | ORAL_TABLET | Freq: Three times a day (TID) | ORAL | 1 refills | Status: DC
Start: 2019-11-25 — End: 2020-04-06

## 2019-11-25 NOTE — ED Provider Notes (Addendum)
[facility] DEPT Provider Note   CSN: [id number] Arrival date & time: 11/25/19  1848     History Chief Complaint  Patient presents with  . Nausea  . Headache    Sarah Newman is a 44 y.o. female.  HPI     44 year old female comes in a chief complaint of nausea, headaches, abdominal pain.  Patient has history of metastatic ovarian cancer status post hysterectomy.  Patient is on her fifth round of chemo.  She reports that she had a chemo session on 4-12.  Normally after chemo patient gets weakness, body aches, mild nausea and then her symptoms improved.  After this session of chemo however, she started developing abdominal pain that is radiating to the back, severe weakness, severe nausea and also headaches.  Her headache started 24 hours ago.  The headaches are frontal and described as dull pain that are constantly present, not responding to Tylenol.  She has no associated numbness, tingling, vision change.  Patient is feeling dizzy, described as lightheadedness.  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  Patient's abdominal pain is mostly in the upper quadrants.  The pain is described as burning type pain.  She has history of ulcers, but they have not given her any problem.  She takes Decadron before her chemotherapy.  She is not on any NSAIDs.  She has noted bloody stools but denies any bloody emesis.   Past Medical History:  Diagnosis Date  . Anemia   . Asthma   . Family history of breast cancer   . Family history of colon cancer   . Family history of ovarian cancer   . Family history of prostate cancer   . Pelvic mass   . Ulcer, stomach peptic     Patient Active Problem List   Diagnosis Date Noted  . 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/2019  . Weight gain 10/25/2019  . Vaginal dryness 10/24/2019  . Drug-induced hyperglycemia 10/03/2019  . Genetic testing 09/23/2019  .  Bone pain 09/13/2019  . Restless leg 09/13/2019  . Elevated liver enzymes 09/13/2019  . Family history of ovarian cancer   . Family history of prostate cancer   . Family history of colon cancer   . Family history of breast cancer   . 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HCC)   . Pelvic mass in female 07/11/2019  . Obesity 07/11/2019  . Cholecystitis 02/16/2015    Past Surgical History:  Procedure Laterality Date  . CHOLECYSTECTOMY N/A 02/16/2015   Procedure: LAPAROSCOPIC CHOLECYSTECTOMY WITH INTRAOPERATIVE CHOLANGIOGRAM;  Surgeon: Johnathan Hausen, MD;  Location: [facility] ORS;  Service: General;  Laterality: N/A;  . IR IMAGING GUIDED PORT INSERTION  08/12/2019  . SALPINGOOPHORECTOMY N/A 07/21/2019   Procedure: EXPLORATORY LAPAROTOMY, TOTAL ABDOMINAL HYSTERECTOMY, BILATERAL SALPINGO OOPHORECTOMY, OMENTECTOMY;  Surgeon: Lafonda Mosses, MD;  Location: [facility];  Service: Gynecology;  Laterality: N/A;     OB History    Gravida  3   Para  3   Term      Preterm      AB      Living  2     SAB      TAB      Ectopic      Multiple      Live Births  2           Family History  Problem Relation Age of Onset  . Colon cancer  Father 8  . Prostate cancer Father 31  . Colon cancer Paternal Aunt        dx. early 65s  . Ovarian cancer Paternal Grandmother 77  . Breast cancer Cousin   . Endometriosis Mother   . Cancer Paternal Uncle        unknown type, dx. early 40s  . Cancer Paternal Uncle 49       unknown type    Social History   Tobacco Use  . Smoking status: Never Smoker  . Smokeless tobacco: Never Used  Substance Use Topics  . Alcohol use: Yes    Comment: Social drinker  . Drug use: No    Home Medications Prior to Admission medications   Medication Sig Start Date End Date Taking? Authorizing Provider  acetaminophen (TYLENOL) 500 MG tablet Take 1,000 mg by mouth every 6 (six) hours as needed for moderate pain.    Yes [provider]  albuterol  (PROVENTIL HFA;VENTOLIN HFA) 108 (90 Base) MCG/ACT inhaler Inhale 2 puffs into the lungs every 4 (four) hours as needed for wheezing or shortness of breath.   Yes [provider]  dexamethasone (DECADRON) 4 MG tablet Take 2 tabs at the night before and 2 tabs the morning of chemotherapy, every 3 weeks, by mouth, please dispense 24 tabs for total 6 cycles of treatment Patient taking differently: Take 4 mg by mouth See admin instructions. Take 4mg  the night before chemo and the morning of chemo. Every 3rd Monday of the month 08/03/19  Yes Gorsuch, Ni, MD  estradiol (CLIMARA) 0.05 mg/24hr patch Place 1 patch (0.05 mg total) onto the skin once a week. Patient taking differently: Place 0.05 mg onto the skin once a week. Friday 08/12/19  Yes Lafonda Mosses, MD  lidocaine-prilocaine (EMLA) cream Apply to affected area once 08/03/19  Yes Heath Lark, MD  metFORMIN (GLUCOPHAGE) 500 MG tablet Take 1 tablet (500 mg total) by mouth 2 (two) times daily with a meal. 10/03/19  Yes Heath Lark, MD  Multiple Vitamin (MULTIVITAMIN WITH MINERALS) TABS tablet Take 1 tablet by mouth daily.   Yes [provider]  omeprazole (PRILOSEC) 20 MG capsule Take 40 mg by mouth daily as needed (acid).    Yes [provider]  ondansetron (ZOFRAN) 8 MG tablet Take 1 tablet (8 mg total) by mouth every 8 (eight) hours as needed for refractory nausea / vomiting. 08/03/19  Yes Gorsuch, Ni, MD  famotidine (PEPCID) 20 MG tablet Take 1 tablet (20 mg total) by mouth 2 (two) times daily. 11/25/19   Varney Biles, MD  ibuprofen (ADVIL) 800 MG tablet Take 1 tablet (800 mg total) by mouth every 8 (eight) hours as needed for moderate pain. For AFTER surgery only Patient not taking: Reported on 08/12/2019 07/11/19   Joylene John D, NP  oxyCODONE (OXY IR/ROXICODONE) 5 MG immediate release tablet Take 1 tablet (5 mg total) by mouth every 4 (four) hours as needed for severe pain. Do not take and drive Patient not taking:  Reported on 11/25/2019 08/29/19   Heath Lark, MD  prochlorperazine (COMPAZINE) 10 MG tablet Take 1 tablet (10 mg total) by mouth every 6 (six) hours as needed (Nausea or vomiting). 08/03/19   Heath Lark, MD  sucralfate (CARAFATE) 1 g tablet Take 1 tablet (1 g total) by mouth 4 (four) times daily -  with meals and at bedtime. 11/25/19   Varney Biles, MD    Allergies    Patient has no known allergies.  Review  of Systems   Review of Systems  Constitutional: Positive for activity change.  Respiratory: Negative for shortness of breath.   Cardiovascular: Negative for chest pain.  Gastrointestinal: Positive for abdominal pain, nausea and vomiting.  Musculoskeletal: Positive for back pain.  Allergic/Immunologic: Negative for immunocompromised state.  Neurological: Positive for dizziness.  Hematological: Does not bruise/bleed easily.  All other systems reviewed and are negative.   Physical Exam Updated Vital Signs BP 114/79   Pulse 87   Temp 98.1 F (36.7 C) (Oral)   Resp 18   Ht 5\' 2"  (1.575 m)   Wt 102.1 kg   LMP 07/05/2019   SpO2 100%   BMI 41.15 kg/m   Physical Exam Vitals and nursing note reviewed.  Constitutional:      Appearance: She is well-developed.  HENT:     Head: Normocephalic and atraumatic.  Cardiovascular:     Rate and Rhythm: Normal rate.  Pulmonary:     Effort: Pulmonary effort is normal.  Abdominal:     General: Bowel sounds are normal.     Tenderness: There is abdominal tenderness.  Musculoskeletal:     Cervical back: Normal range of motion and neck supple.  Skin:    General: Skin is warm and dry.  Neurological:     Mental Status: She is alert and oriented to person, place, and time.     ED Results / Procedures / Treatments   Labs (all labs ordered are listed, but only abnormal results are displayed) Labs Reviewed  COMPREHENSIVE METABOLIC PANEL - Abnormal; Notable for the following components:      Result Value   Potassium 3.1 (*)     Glucose, Bld 101 (*)    Total Bilirubin <0.1 (*)    All other components within normal limits  CBC WITH DIFFERENTIAL/PLATELET - Abnormal; Notable for the following components:   WBC 3.8 (*)    RBC 3.38 (*)    Hemoglobin 9.4 (*)    HCT 29.2 (*)    All other components within normal limits  RETICULOCYTES - Abnormal; Notable for the following components:   RBC. 3.37 (*)    All other components within normal limits  PROTIME-INR  LIPASE, BLOOD  TYPE AND SCREEN    EKG None  Radiology DG Chest Port 1 View  Result Date: 11/25/2019 CLINICAL DATA:  Epigastric and chest pain. Chemotherapy patient for ovarian cancer. EXAM: PORTABLE CHEST 1 VIEW COMPARISON:  11/18/2016 FINDINGS: Power port inserted from a right internal jugular approach has its tip at the SVC RA junction. Heart size is normal. Mediastinal shadows are normal. The lungs are clear. No effusions. No free air seen under the diaphragm. No bone abnormality. IMPRESSION: No active disease.  Power port in place. Electronically Signed   By: Nelson Chimes M.D.   On: 11/25/2019 20:38    Procedures Procedures (including critical care time)  Medications Ordered in ED Medications  morphine 4 MG/ML injection 8 mg (8 mg Intravenous Given 11/25/19 2018)  ondansetron (ZOFRAN) injection 4 mg (4 mg Intravenous Given 11/25/19 2018)  pantoprazole (PROTONIX) injection 40 mg (40 mg Intravenous Given 11/25/19 2018)  sucralfate (CARAFATE) tablet 1 g (1 g Oral Given 11/25/19 2209)  ketorolac (TORADOL) 30 MG/ML injection 15 mg (15 mg Intravenous Given 11/25/19 2239)  metoCLOPramide (REGLAN) injection 10 mg (10 mg Intravenous Given 11/25/19 2240)    ED Course  I have reviewed the triage vital signs and the nursing notes.  Pertinent labs & imaging results that were available during my  care of the patient were reviewed by me and considered in my medical decision making (see chart for details).  Clinical Course as of Nov 24 2332  Fri Nov 25, 2019  2331 All  of patient's lab results are reassuring.  She has normal LFTs and lipase.  Her hemoglobin is about a gram less than last time, which could be because of her GI bleeding.  Patient's abdominal pain resolved with pain medications.  Headaches resolved with Reglan, Toradol.  I discussed the results of the ED work-up with the patient.  She is comfortable with follow-up with Dr. Alvy Bimler on Monday.  She prefers outpatient follow-up to see if she needs CT scan.  My suspicion for emergent etiology for her symptoms is extremely low, therefore I am comfortable with her choice as well.  Strict ER return precautions have been discussed with the patient.    [AN]  2332 Given that patient has history of peptic ulcer disease, we have requested that she restart taking the Pepcid and adding Carafate to her regimen.   [AN]    Clinical Course User Index [AN] Varney Biles, MD   MDM Rules/Calculators/A&P                      44 year old comes in a chief complaint of abdominal pain, weakness, dizziness. She has history of ovarian cancer.  She is on chemotherapy and had her last session just few days back.  It appears that her symptoms currently are different than her previous chemo response.  She is also complaining of abdominal pain radiating to the back that could be because of gastritis, especially in the setting of bleeding that she complains about.  Differential diagnosis includes anemia for her dizziness.  Other possibilities include severe electrolyte abnormalities.  Headaches are undifferentiated.  They have been present for 24 hours, not severe.  We will check basic labs and reassess.  Final Clinical Impression(s) / ED Diagnoses Final diagnoses:  PUD (peptic ulcer disease)  Epigastric abdominal pain    Rx / DC Orders ED Discharge Orders         Ordered    famotidine (PEPCID) 20 MG tablet  2 times daily     11/25/19 2329    sucralfate (CARAFATE) 1 g tablet  3 times daily with meals & bedtime      11/25/19 2329           Varney Biles, MD 11/25/19 [id number]    Varney Biles, MD 11/25/19 2358

## 2019-11-25 NOTE — Discharge Instructions (Signed)
All the results in the ER are normal, labs and imaging. We are not sure what is causing your symptoms. We suspect that there could be an element of gastritis causing your symptoms.  Please start taking the medication as prescribed. Please follow-up with the GI doctors for the bloody stools abdominal pain.   The workup in the ER is not complete, and is limited to screening for life threatening and emergent conditions only, so please see a primary care doctor for further evaluation.

## 2019-11-25 NOTE — ED Triage Notes (Signed)
Pt arrived from home by POV. Pt had 5th chemo treatment Monday 4/12. Pt states that she has been exhausted and nauseous and has had a headache since her chemo treatment. Pt was diagnosed with ovarian cancer in December.

## 2019-11-28 ENCOUNTER — Telehealth: Payer: Self-pay

## 2019-11-28 NOTE — Telephone Encounter (Signed)
Called and given below message. She verbalized understanding. 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 She does not have any PAL time left and knows she will miss time next week. She is complaining headache. Caffeine does not help. She will continue to take tylenol prn.

## 2019-11-28 NOTE — Telephone Encounter (Signed)
-----   Message from Heath Lark, MD sent at 11/28/2019  6:54 AM EDT ----- Regarding: looks like she went to the ER last weekend. can you call and ask how she is doing?

## 2019-11-29 NOTE — Progress Notes (Signed)
Pharmacist Chemotherapy Monitoring - Follow Up Assessment    I verify that I have reviewed each item in the below checklist:  . Regimen for the patient is scheduled for the appropriate day and plan matches scheduled date. Marland Kitchen Appropriate non-routine labs are ordered dependent on drug ordered. . If applicable, additional medications reviewed and ordered per protocol based on lifetime cumulative doses and/or treatment regimen.   Plan for follow-up and/or issues identified: No . I-vent associated with next due treatment: No . MD and/or nursing notified: No  Sarah Newman 11/29/2019 11:38 AM

## 2019-12-02 MED FILL — ESTRADIOL 0.05 MG/DAY PATCH: 0.05 | 28 days supply | Qty: 4 | Fill #3

## 2019-12-05 ENCOUNTER — Inpatient Hospital Stay: Payer: BC Managed Care – PPO

## 2019-12-05 ENCOUNTER — Telehealth: Payer: Self-pay | Admitting: Hematology and Oncology

## 2019-12-05 ENCOUNTER — Other Ambulatory Visit: Payer: Self-pay

## 2019-12-05 ENCOUNTER — Encounter: Payer: Self-pay | Admitting: Hematology and Oncology

## 2019-12-05 ENCOUNTER — Inpatient Hospital Stay (HOSPITAL_BASED_OUTPATIENT_CLINIC_OR_DEPARTMENT_OTHER): Payer: BC Managed Care – PPO | Admitting: Hematology and Oncology

## 2019-12-05 ENCOUNTER — Inpatient Hospital Stay: Payer: BC Managed Care – PPO | Attending: Gynecologic Oncology

## 2019-12-05 VITALS — BP 136/90 | HR 81

## 2019-12-05 DIAGNOSIS — D6481 Anemia due to antineoplastic chemotherapy: Secondary | ICD-10-CM

## 2019-12-05 DIAGNOSIS — C562 Malignant neoplasm of left ovary: Secondary | ICD-10-CM

## 2019-12-05 DIAGNOSIS — K2900 Acute gastritis without bleeding: Secondary | ICD-10-CM

## 2019-12-05 DIAGNOSIS — R5383 Other fatigue: Secondary | ICD-10-CM | POA: Diagnosis not present

## 2019-12-05 DIAGNOSIS — Z5111 Encounter for antineoplastic chemotherapy: Secondary | ICD-10-CM | POA: Diagnosis not present

## 2019-12-05 DIAGNOSIS — K760 Fatty (change of) liver, not elsewhere classified: Secondary | ICD-10-CM | POA: Diagnosis not present

## 2019-12-05 DIAGNOSIS — R519 Headache, unspecified: Secondary | ICD-10-CM | POA: Diagnosis not present

## 2019-12-05 DIAGNOSIS — R42 Dizziness and giddiness: Secondary | ICD-10-CM | POA: Diagnosis not present

## 2019-12-05 DIAGNOSIS — T451X5A Adverse effect of antineoplastic and immunosuppressive drugs, initial encounter: Secondary | ICD-10-CM | POA: Insufficient documentation

## 2019-12-05 DIAGNOSIS — R739 Hyperglycemia, unspecified: Secondary | ICD-10-CM

## 2019-12-05 DIAGNOSIS — T50905A Adverse effect of unspecified drugs, medicaments and biological substances, initial encounter: Secondary | ICD-10-CM

## 2019-12-05 DIAGNOSIS — R1900 Intra-abdominal and pelvic swelling, mass and lump, unspecified site: Secondary | ICD-10-CM | POA: Diagnosis not present

## 2019-12-05 DIAGNOSIS — R232 Flushing: Secondary | ICD-10-CM | POA: Diagnosis not present

## 2019-12-05 DIAGNOSIS — K2970 Gastritis, unspecified, without bleeding: Secondary | ICD-10-CM | POA: Diagnosis not present

## 2019-12-05 DIAGNOSIS — Z79899 Other long term (current) drug therapy: Secondary | ICD-10-CM | POA: Diagnosis not present

## 2019-12-05 DIAGNOSIS — Z8719 Personal history of other diseases of the digestive system: Secondary | ICD-10-CM | POA: Insufficient documentation

## 2019-12-05 DIAGNOSIS — Z006 Encounter for examination for normal comparison and control in clinical research program: Secondary | ICD-10-CM | POA: Diagnosis present

## 2019-12-05 LAB — CMP (CANCER CENTER ONLY)
ALT: 28 U/L (ref 0–44)
AST: 27 U/L (ref 15–41)
Albumin: 4.1 g/dL (ref 3.5–5.0)
Alkaline Phosphatase: 97 U/L (ref 38–126)
Anion gap: 14 (ref 5–15)
BUN: 13 mg/dL (ref 6–20)
CO2: 23 mmol/L (ref 22–32)
Calcium: 10 mg/dL (ref 8.9–10.3)
Chloride: 103 mmol/L (ref 98–111)
Creatinine: 0.74 mg/dL (ref 0.44–1.00)
GFR, Est AFR Am: >60 mL/min (ref >60)
GFR, Estimated: >60 mL/min (ref >60)
Glucose, Bld: 156 mg/dL — ABNORMAL HIGH (ref 70–99)
Potassium: 4.2 mmol/L (ref 3.5–5.1)
Sodium: 140 mmol/L (ref 135–145)
Total Bilirubin: 0.3 mg/dL (ref 0.3–1.2)
Total Protein: 7.6 g/dL (ref 6.5–8.1)

## 2019-12-05 LAB — CBC WITH DIFFERENTIAL (CANCER CENTER ONLY)
Abs Immature Granulocytes: 0.1 10*3/uL — ABNORMAL HIGH (ref 0.00–0.07)
Basophils Absolute: 0 10*3/uL (ref 0.0–0.1)
Basophils Relative: 0 %
Eosinophils Absolute: 0 10*3/uL (ref 0.0–0.5)
Eosinophils Relative: 0 %
HCT: 32.7 % — ABNORMAL LOW (ref 36.0–46.0)
Hemoglobin: 10.7 g/dL — ABNORMAL LOW (ref 12.0–15.0)
Immature Granulocytes: 1 %
Lymphocytes Relative: 11 %
Lymphs Abs: 0.9 10*3/uL (ref 0.7–4.0)
MCH: 27.5 pg (ref 26.0–34.0)
MCHC: 32.7 g/dL (ref 30.0–36.0)
MCV: 84.1 fL (ref 80.0–100.0)
Monocytes Absolute: 0.1 10*3/uL (ref 0.1–1.0)
Monocytes Relative: 1 %
Neutro Abs: 6.9 10*3/uL (ref 1.7–7.7)
Neutrophils Relative %: 87 %
Platelet Count: 345 10*3/uL (ref 150–400)
RBC: 3.89 MIL/uL (ref 3.87–5.11)
RDW: 14.2 % (ref 11.5–15.5)
WBC Count: 8 10*3/uL (ref 4.0–10.5)
nRBC: 0 % (ref 0.0–0.2)

## 2019-12-05 MED ORDER — SODIUM CHLORIDE 0.9% FLUSH
10.0000 mL | Freq: Once | INTRAVENOUS | Status: AC
  Administered 2019-12-05: 10 mL
  Filled 2019-12-05: qty 10

## 2019-12-05 MED ORDER — OXYCODONE HCL 5 MG PO TABS: 5.0000 mg | ORAL_TABLET | ORAL | 0 refills | Status: DC | PRN

## 2019-12-05 MED ORDER — HEPARIN SOD (PORK) LOCK FLUSH 100 UNIT/ML IV SOLN
500.0000 [IU] | Freq: Once | INTRAVENOUS | Status: AC | PRN
  Administered 2019-12-05: 500 [IU]
  Filled 2019-12-05: qty 5

## 2019-12-05 MED ORDER — SODIUM CHLORIDE 0.9 % IV SOLN
Freq: Once | INTRAVENOUS | Status: AC
  Filled 2019-12-05: qty 250

## 2019-12-05 MED ORDER — SODIUM CHLORIDE 0.9 % IV SOLN
750.0000 mg | Freq: Once | INTRAVENOUS | Status: AC
  Administered 2019-12-05: 750 mg via INTRAVENOUS
  Filled 2019-12-05: qty 75

## 2019-12-05 MED ORDER — SODIUM CHLORIDE 0.9 % IV SOLN
140.0000 mg/m2 | Freq: Once | INTRAVENOUS | Status: AC
  Administered 2019-12-05: 294 mg via INTRAVENOUS
  Filled 2019-12-05: qty 49

## 2019-12-05 MED ORDER — PALONOSETRON HCL INJECTION 0.25 MG/5ML
INTRAVENOUS | Status: AC
  Filled 2019-12-05: qty 5

## 2019-12-05 MED ORDER — DIPHENHYDRAMINE HCL 50 MG/ML IJ SOLN
INTRAMUSCULAR | Status: AC
  Filled 2019-12-05: qty 1

## 2019-12-05 MED ORDER — SODIUM CHLORIDE 0.9 % IV SOLN
150.0000 mg | Freq: Once | INTRAVENOUS | Status: AC
  Administered 2019-12-05: 150 mg via INTRAVENOUS
  Filled 2019-12-05: qty 150

## 2019-12-05 MED ORDER — PALONOSETRON HCL INJECTION 0.25 MG/5ML
0.2500 mg | Freq: Once | INTRAVENOUS | Status: AC
  Administered 2019-12-05: 0.25 mg via INTRAVENOUS

## 2019-12-05 MED ORDER — FAMOTIDINE IN NACL 20-0.9 MG/50ML-% IV SOLN
20.0000 mg | Freq: Once | INTRAVENOUS | Status: AC
  Administered 2019-12-05: 20 mg via INTRAVENOUS

## 2019-12-05 MED ORDER — SODIUM CHLORIDE 0.9 % IV SOLN
10.0000 mg | Freq: Once | INTRAVENOUS | Status: AC
  Administered 2019-12-05: 10 mg via INTRAVENOUS
  Filled 2019-12-05: qty 10

## 2019-12-05 MED ORDER — FAMOTIDINE IN NACL 20-0.9 MG/50ML-% IV SOLN
INTRAVENOUS | Status: AC
  Filled 2019-12-05: qty 50

## 2019-12-05 MED ORDER — DIPHENHYDRAMINE HCL 50 MG/ML IJ SOLN
25.0000 mg | Freq: Once | INTRAMUSCULAR | Status: AC
  Administered 2019-12-05: 25 mg via INTRAVENOUS

## 2019-12-05 MED ORDER — SODIUM CHLORIDE 0.9% FLUSH
10.0000 mL | INTRAVENOUS | Status: DC | PRN
  Administered 2019-12-05: 10 mL
  Filled 2019-12-05: qty 10

## 2019-12-05 MED FILL — oxyCODONE HCL 5 MG TABS: 5 | 5 days supply | Qty: 30 | Fill #0

## 2019-12-05 NOTE — Assessment & Plan Note (Signed)
This is an anticipated side effects from steroid therapy We discussed dietary modification

## 2019-12-05 NOTE — Assessment & Plan Note (Signed)
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

## 2019-12-05 NOTE — Telephone Encounter (Signed)
Spoke with pt and pt is aware of the appts on 6/3 and 6/4.

## 2019-12-05 NOTE — Patient Instructions (Signed)
[facility] Discharge Instructions for Patients Receiving Chemotherapy  Today you received the following chemotherapy agents: Taxol/Carbo  To help prevent nausea and vomiting after your treatment, we encourage you to take your nausea medication as directed.   If you develop nausea and vomiting that is not controlled by your nausea medication, call the clinic.   BELOW ARE SYMPTOMS THAT SHOULD BE REPORTED IMMEDIATELY:  *FEVER GREATER THAN 100.5 F  *CHILLS WITH OR WITHOUT FEVER  NAUSEA AND VOMITING THAT IS NOT CONTROLLED WITH YOUR NAUSEA MEDICATION  *UNUSUAL SHORTNESS OF BREATH  *UNUSUAL BRUISING OR BLEEDING  TENDERNESS IN MOUTH AND THROAT WITH OR WITHOUT PRESENCE OF ULCERS  *URINARY PROBLEMS  *BOWEL PROBLEMS  UNUSUAL RASH Items with * indicate a potential emergency and should be followed up as soon as possible.  Feel free to call the clinic should you have any questions or concerns. The clinic phone number is [phone].  Please show the CHEMO ALERT CARD at check-in to the Emergency Department and triage nurse.   

## 2019-12-05 NOTE — Assessment & Plan Note (Signed)

## 2019-12-05 NOTE — Progress Notes (Signed)
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 The patient denies recent history of bleeding such as epistaxis, hematuria or hematochezia. She is asymptomatic from the anemia. I will observe for now.  She does not require transfusion now. I will continue the chemotherapy at current dose without dosage adjustment.  If the anemia gets progressive worse in the future, I might have to delay her treatment or adjust the chemotherapy dose.   Gastritis She has mild gastritis likely secondary to steroids treatment We discussed the use of omeprazole, Pepcid as well as Tums as needed   Orders Placed This Encounter  Procedures  . CT CHEST W CONTRAST    Standing Status:   Future    Standing Expiration Date:   12/04/2020    Order Specific Question:   If indicated for the ordered procedure, I authorize the administration of contrast media per Radiology protocol    Answer:   Yes    Order Specific Question:   Preferred imaging location?    Answer:   [facility]    Order Specific Question:   Radiology Contrast Protocol - do NOT remove file path    Answer:   \\[email or web address]    Order Specific  Question:   Is patient pregnant?    Answer:   No  . CT ABDOMEN PELVIS W CONTRAST    Standing Status:   Future    Standing Expiration Date:   12/04/2020    Order Specific Question:   If indicated for the ordered procedure, I authorize the administration of contrast media per Radiology protocol    Answer:   Yes    Order Specific Question:   Preferred imaging location?    Answer:   [facility]    Order Specific Question:   Radiology Contrast Protocol - do NOT remove file path    Answer:   \\[email or web address]    Order Specific Question:   Is patient pregnant?    Answer:   No    All questions were answered. The patient knows to call the clinic with any problems, questions or concerns. The total time spent in the appointment was 20 minutes encounter with patients including review of chart and various tests results, discussions about plan of care and coordination of care plan   Heath Lark, MD 12/05/2019 9:41 AM  INTERVAL HISTORY: Please see below for problem oriented charting. She returns for final treatment today She went to the ER recently due to symptoms of dizziness, fatigue, some headaches and persistent nausea She was given some antiacid medicine in the ER with improvement of her symptoms She has taken some occasional NSAID for headaches She denies peripheral neuropathy No recent constipation She continues to have intermittent bone pain after chemo that is resolved Overall, she felt  better today  SUMMARY OF ONCOLOGIC HISTORY: Oncology History Overview Note  Clear cell features Negative genetics   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[place])  07/02/2019 Imaging   Ct scan of abdomen and pelvis 1. There is a 16 cm complex cystic mass in the pelvis containing thickened septations located near midline, abutting the superior aspect of the uterus. I suspect this mass is adnexal/ovarian in origin rather than uterine in origin. Specifically, I suspect the mass  likely arises from the left ovary/adnexa and is most consistent with a neoplasm. Malignancy is certainly not excluded on this study. Recommend a pelvic ultrasound for further evaluation. 2. The rounded more solid-appearing structure in the right side of the pelvis is probably the right ovary. Recommend attention on ultrasound. 3. The small amount of fluid in the pelvis is likely reactive to the complex cystic mass likely rising from the left ovary/adnexa. 4. No cause for blood in vomit or black stools identified. No convincing evidence of a perforated or inflamed gastric or duodenal ulcer. 5. Hepatic steatosis.   07/02/2019 Imaging   MRI pelvis 1. 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 There are 2 enhancing nodules within the central upper abdomen raising the possibility of peritoneal carcinomatosis. Additionally, there is a small amount fluid within the pelvis. Malignant ascites not excluded. 2. Fibroid uterus. 3. Hepatic steatosis.   07/02/2019 Imaging   US pelvis 1. There is a large mass in the pelvis also seen on CT imaging. A separate left ovary is not visualized. 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 The mass is suspicious for malignancy. Recommend gynecologic consultation. 2. The right ovary demonstrates an irregular ill-defined hypoechoic hypervascular region centrally. The findings are nonspecific in the right ovary. However, given the apparent left ovarian neoplasm suspicious for malignancy, recommend an MRI to further assess the right ovary. 3. Uterine fibroid measuring 3.6 cm.     07/21/2019 Pathology Results   FINAL MICROSCOPIC DIAGNOSIS: A. 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 - See oncology table. B. OMENTUM, RESECTION: - Omental lymph node with metastatic carcinoma (1/1). - Omental adipose  tissue with foci of inflammation and reactive mesothelial changes. C. FALLOPIAN TUBE, LEFT, RESECTION: - Fallopian tube with focal, mild inflammation and fibrosis. - No tumor identified. D. UTERUS, CERVIX, RIGHT TUBE AND OVARY: - Cervix Nabothian cyst and squamous metaplasia. - Endometrium Proliferative. No hyperplasia or carcinoma. - Myometrium Leiomyomata. No malignancy. -Right ovary Poorly differentiated carcinoma, 4.8 cm. See oncology table and comment. -Right Fallopian tube Benign paratubal cyst. No endometriosis or malignancy. ONCOLOGY TABLE: OVARY or FALLOPIAN TUBE or PRIMARY PERITONEUM: Procedure: Bilateral f-oophorectomy, hysterectomy and omentectomy. Specimen Integrity: Intact Tumor Site: Left ovary and right ovary. See comment. Ovarian Surface Involvement: Not identified. Fallopian Tube Surface Involvement: Not identified. Tumor Size: Left ovary: 18.6 x 16.0 x 7.2 cm. Right ovary: 4.8 x 3.2 x 3.2 cm. Histologic Type: Clear cell carcinoma. See comment. Histologic Grade: High-grade. Other Tissue/ Organ Involvement: Omental lymph node with metastatic carcinoma. Peritoneal/Ascitic Fluid: Pending. Treatment Effect: No known presurgical therapy. Pathologic Stage Classification (pTNM, AJCC 8th Edition): pT3a, pN1a. Representative Tumor Block: A2, A3, A4, A5 A6, A7 and A8. Comment(s): The left ovarian tumor is 18.6 cm in greatest dimension and has features of clear cell carcinoma. The right ovarian tumor is 4.8 cm in greatest dimension and is poorly differentiated  with focal features consistent with clear cell carcinoma. The pattern of involvement suggests that the right ovarian tumor is likely metastatic from the larger left ovarian clear cell carcinoma. Sections of the omentum show a lymph node with metastatic carcinoma. Sections of the remainder of the omentum do not show involvement by carcinoma.   07/21/2019 Surgery   Procedure(s) Performed: Exploratory laparotomy with  radical tumor debulking including total hysterectomy, bilateral salpingo-oophorectomy, infra-gastric omentectomy, and washings.   Surgeon: Jeral Pinch, MD    Operative Findings: On EUA, mobile uterus and ovarian mass, situated out of the pelvis, spanning 6-8 cm above the umbilicus.  On intra-abdominal entry, inflamed omentum adherent to much of the 16 cm left ovarian mass, adhesions easily lysed bluntly.  No nodularity of the ovarian mass however on delivery of the mass through the midline incision, there was Intra-Op rupture of one of the smaller cystic components with drainage of clear brown-tinged fluid.  Grossly normal-appearing left fallopian tube as well as right tube.  Right ovary mildly enlarged measuring approximately 4 cm and firm/nodular, suspicious for tumor involvement.  Uterus 8-10 cm with 4 cm anterior mid body fibroid.  Some very small volume miliary disease along right pelvic sidewall, removed with uterine specimen.  Evidence of small diverticular disease.  Small bowel normal appearing although multiple loops of bowel adherent to each other, especially by mesentery, and an inflammatory manner.  An approximately 2 x 2 centimeter nodule suspicious for tumor implant was found in the omentum just inferior to the greater curvature of the stomach.  Additional small (less than 5 mm) implants noted on the stomach and posterior aspect of the left lobe of the liver.  Otherwise the liver surface and diaphragm were smooth.   08/03/2019 Cancer Staging   Staging form: Ovary, Fallopian Tube, and Primary Peritoneal Carcinoma, AJCC 8th Edition - Pathologic: Stage IIIA2 (pT3a, pN1, cM0) - Signed by Heath Lark, MD on 08/03/2019   08/12/2019 Procedure   Placement of single lumen port a cath via right internal jugular vein. The catheter tip lies at the cavo-atrial junction. A power injectable port a cath was placed and is ready for immediate use.   08/22/2019 -  Chemotherapy   The patient had  carboplatin and taxol for chemotherapy treatment.     09/23/2019 Genetic Testing   Negative genetic testing:  No pathogenic variants detected on the [facility] TumorNext-HRD+CancerNext paired germline and somatic genetic test. The report date is 09/23/2019.  The TumorNext-HRD+CancerNext test offered by [facility] includes paired germline and tumor analyses of 11 genes associated with homologous recombination repair (ATM, BARD1, BRIP1, CHEK2, MRE11A, NBN, PALB2, RAD51C, RAD51D, BRCA1, BRCA2) plus germline analyses of 26 additional genes associated with hereditary cancer (APC, AXIN2, BMPR1A, CDH1, CDK4, CDKN2A, DICER1, HOXB13, EPCAM, GREM1, MLH1, MSH2, MSH3, MSH6, MUTYH, NF1, NTHL1, PMS2, POLD1, POLE, PTEN, RECQL, SMAD4, SMARCA4, STK11, and TP53).   11/14/2019 Tumor Marker   Patient's tumor was tested for the following markers: CA-125 Results of the tumor marker test revealed 10.2.     REVIEW OF SYSTEMS:   Constitutional: Denies fevers, chills or abnormal weight loss Eyes: Denies blurriness of vision Ears, nose, mouth, throat, and face: Denies mucositis or sore throat Respiratory: Denies cough, dyspnea or wheezes Cardiovascular: Denies palpitation, chest discomfort or lower extremity swelling Skin: Denies abnormal skin rashes Lymphatics: Denies new lymphadenopathy or easy bruising Neurological:Denies numbness, tingling or new weaknesses Behavioral/Psych: Mood is stable, no new changes  All other systems were reviewed with the patient and are negative.  I have reviewed the past medical history, past surgical history, social history and family history with the patient and they are unchanged from previous note.  ALLERGIES:  has No Known Allergies.  MEDICATIONS:  Current Outpatient Medications  Medication Sig Dispense Refill  . acetaminophen (TYLENOL) 500 MG tablet Take 1,000 mg by mouth every 6 (six) hours as needed for moderate pain.     Marland Kitchen albuterol (PROVENTIL HFA;VENTOLIN HFA) 108 (90 Base) MCG/ACT  inhaler Inhale 2 puffs into the lungs every 4 (four) hours as needed for wheezing or shortness of breath.    . estradiol (CLIMARA) 0.05 mg/24hr patch Place 1 patch (0.05 mg total) onto the skin once a week. (Patient taking differently: Place 0.05 mg onto the skin once a week. Friday) 4 patch 12  . famotidine (PEPCID) 20 MG tablet Take 1 tablet (20 mg total) by mouth 2 (two) times daily. 60 tablet 1  . lidocaine-prilocaine (EMLA) cream Apply to affected area once 30 g 3  . metFORMIN (GLUCOPHAGE) 500 MG tablet Take 1 tablet (500 mg total) by mouth 2 (two) times daily with a meal. 60 tablet 1  . Multiple Vitamin (MULTIVITAMIN WITH MINERALS) TABS tablet Take 1 tablet by mouth daily.    Marland Kitchen omeprazole (PRILOSEC) 20 MG capsule Take 40 mg by mouth daily as needed (acid).     . ondansetron (ZOFRAN) 8 MG tablet Take 1 tablet (8 mg total) by mouth every 8 (eight) hours as needed for refractory nausea / vomiting. 30 tablet 1  . oxyCODONE (OXY IR/ROXICODONE) 5 MG immediate release tablet Take 1 tablet (5 mg total) by mouth every 4 (four) hours as needed for severe pain. Do not take and drive 30 tablet 0  . prochlorperazine (COMPAZINE) 10 MG tablet Take 1 tablet (10 mg total) by mouth every 6 (six) hours as needed (Nausea or vomiting). 30 tablet 1  . sucralfate (CARAFATE) 1 g tablet Take 1 tablet (1 g total) by mouth 4 (four) times daily -  with meals and at bedtime. 90 tablet 1   No current facility-administered medications for this visit.    PHYSICAL EXAMINATION: ECOG PERFORMANCE STATUS: 1 - Symptomatic but completely ambulatory  Vitals:   12/05/19 0924  BP: (!) 144/100  Pulse: 88  Resp: 18  Temp: 98 F (36.7 C)  SpO2: 100%   Filed Weights   12/05/19 0924  Weight: 228 lb 3.2 oz (103.5 kg)    GENERAL:alert, no distress and comfortable SKIN: skin color, texture, turgor are normal, no rashes or significant lesions EYES: normal, Conjunctiva are pink and non-injected, sclera clear OROPHARYNX:no  exudate, no erythema and lips, buccal mucosa, and tongue normal  NECK: supple, thyroid normal size, non-tender, without nodularity LYMPH:  no palpable lymphadenopathy in the cervical, axillary or inguinal LUNGS: clear to auscultation and percussion with normal breathing effort HEART: regular rate & rhythm and no murmurs and no lower extremity edema ABDOMEN:abdomen soft, mild epigastric tenderness Musculoskeletal:no cyanosis of digits and no clubbing  NEURO: alert & oriented x 3 with fluent speech, no focal motor/sensory deficits  LABORATORY DATA:  I have reviewed the data as listed    Component Value Date/Time   NA 141 11/25/2019 2015   K 3.1 (L) 11/25/2019 2015   CL 103 11/25/2019 2015   CO2 28 11/25/2019 2015   GLUCOSE 101 (H) 11/25/2019 2015   BUN 11 11/25/2019 2015   CREATININE 0.53 11/25/2019 2015   CREATININE 0.73 11/14/2019 0944   CALCIUM 9.1 11/25/2019 2015   PROT  7.5 11/25/2019 2015   ALBUMIN 3.9 11/25/2019 2015   AST 25 11/25/2019 2015   AST 27 11/14/2019 0944   ALT 28 11/25/2019 2015   ALT 34 11/14/2019 0944   ALKPHOS 74 11/25/2019 2015   BILITOT <0.1 (L) 11/25/2019 2015   BILITOT <0.2 (L) 11/14/2019 0944   GFRNONAA >60 11/25/2019 2015   GFRNONAA >60 11/14/2019 0944   GFRAA >60 11/25/2019 2015   GFRAA >60 11/14/2019 0944    No results found for: SPEP, UPEP  Lab Results  Component Value Date   WBC 8.0 12/05/2019   NEUTROABS 6.9 12/05/2019   HGB 10.7 (L) 12/05/2019   HCT 32.7 (L) 12/05/2019   MCV 84.1 12/05/2019   PLT 345 12/05/2019      Chemistry      Component Value Date/Time   NA 141 11/25/2019 2015   K 3.1 (L) 11/25/2019 2015   CL 103 11/25/2019 2015   CO2 28 11/25/2019 2015   BUN 11 11/25/2019 2015   CREATININE 0.53 11/25/2019 2015   CREATININE 0.73 11/14/2019 0944      Component Value Date/Time   CALCIUM 9.1 11/25/2019 2015   ALKPHOS 74 11/25/2019 2015   AST 25 11/25/2019 2015   AST 27 11/14/2019 0944   ALT 28 11/25/2019 2015   ALT 34  11/14/2019 0944   BILITOT <0.1 (L) 11/25/2019 2015   BILITOT <0.2 (L) 11/14/2019 3338

## 2019-12-05 NOTE — Assessment & Plan Note (Signed)
She has mild gastritis likely secondary to steroids treatment We discussed the use of omeprazole, Pepcid as well as Tums as needed

## 2019-12-06 LAB — CA 125: Cancer Antigen (CA) 125: 9.9 U/mL (ref 0.0–38.1)

## 2019-12-12 ENCOUNTER — Other Ambulatory Visit: Payer: Self-pay | Admitting: Hematology and Oncology

## 2019-12-17 ENCOUNTER — Ambulatory Visit: Payer: BC Managed Care – PPO

## 2019-12-22 ENCOUNTER — Encounter: Payer: Self-pay | Admitting: Oncology

## 2020-01-05 ENCOUNTER — Ambulatory Visit (HOSPITAL_COMMUNITY)
Admission: RE | Admit: 2020-01-05 | Discharge: 2020-01-05 | Disposition: A | Discharge status: Home / Self Care | Payer: BC Managed Care – PPO | Source: Ambulatory Visit | Attending: Hematology and Oncology | Admitting: Hematology and Oncology

## 2020-01-05 ENCOUNTER — Other Ambulatory Visit: Payer: Self-pay

## 2020-01-05 ENCOUNTER — Inpatient Hospital Stay: Payer: BC Managed Care – PPO

## 2020-01-05 ENCOUNTER — Other Ambulatory Visit: Payer: Self-pay | Admitting: Hematology and Oncology

## 2020-01-05 ENCOUNTER — Inpatient Hospital Stay: Payer: BC Managed Care – PPO | Attending: Gynecologic Oncology

## 2020-01-05 DIAGNOSIS — K760 Fatty (change of) liver, not elsewhere classified: Secondary | ICD-10-CM | POA: Insufficient documentation

## 2020-01-05 DIAGNOSIS — R635 Abnormal weight gain: Secondary | ICD-10-CM | POA: Diagnosis not present

## 2020-01-05 DIAGNOSIS — C562 Malignant neoplasm of left ovary: Secondary | ICD-10-CM | POA: Insufficient documentation

## 2020-01-05 DIAGNOSIS — Z79899 Other long term (current) drug therapy: Secondary | ICD-10-CM | POA: Insufficient documentation

## 2020-01-05 DIAGNOSIS — R1900 Intra-abdominal and pelvic swelling, mass and lump, unspecified site: Secondary | ICD-10-CM | POA: Insufficient documentation

## 2020-01-05 DIAGNOSIS — M898X9 Other specified disorders of bone, unspecified site: Secondary | ICD-10-CM | POA: Diagnosis not present

## 2020-01-05 DIAGNOSIS — R300 Dysuria: Secondary | ICD-10-CM | POA: Insufficient documentation

## 2020-01-05 DIAGNOSIS — R232 Flushing: Secondary | ICD-10-CM | POA: Insufficient documentation

## 2020-01-05 LAB — CBC WITH DIFFERENTIAL (CANCER CENTER ONLY)
Abs Immature Granulocytes: 0.01 10*3/uL (ref 0.00–0.07)
Basophils Absolute: 0 10*3/uL (ref 0.0–0.1)
Basophils Relative: 0 %
Eosinophils Absolute: 0.1 10*3/uL (ref 0.0–0.5)
Eosinophils Relative: 3 %
HCT: 31.3 % — ABNORMAL LOW (ref 36.0–46.0)
Hemoglobin: 10.2 g/dL — ABNORMAL LOW (ref 12.0–15.0)
Immature Granulocytes: 0 %
Lymphocytes Relative: 26 %
Lymphs Abs: 1.2 10*3/uL (ref 0.7–4.0)
MCH: 28.4 pg (ref 26.0–34.0)
MCHC: 32.6 g/dL (ref 30.0–36.0)
MCV: 87.2 fL (ref 80.0–100.0)
Monocytes Absolute: 0.3 10*3/uL (ref 0.1–1.0)
Monocytes Relative: 7 %
Neutro Abs: 2.9 10*3/uL (ref 1.7–7.7)
Neutrophils Relative %: 64 %
Platelet Count: 212 10*3/uL (ref 150–400)
RBC: 3.59 MIL/uL — ABNORMAL LOW (ref 3.87–5.11)
RDW: 14.5 % (ref 11.5–15.5)
WBC Count: 4.5 10*3/uL (ref 4.0–10.5)
nRBC: 0 % (ref 0.0–0.2)

## 2020-01-05 LAB — CMP (CANCER CENTER ONLY)
ALT: 31 U/L (ref 0–44)
AST: 28 U/L (ref 15–41)
Albumin: 3.8 g/dL (ref 3.5–5.0)
Alkaline Phosphatase: 98 U/L (ref 38–126)
Anion gap: 11 (ref 5–15)
BUN: 8 mg/dL (ref 6–20)
CO2: 24 mmol/L (ref 22–32)
Calcium: 9.3 mg/dL (ref 8.9–10.3)
Chloride: 105 mmol/L (ref 98–111)
Creatinine: 0.73 mg/dL (ref 0.44–1.00)
GFR, Est AFR Am: >60 mL/min (ref >60)
GFR, Estimated: >60 mL/min (ref >60)
Glucose, Bld: 114 mg/dL — ABNORMAL HIGH (ref 70–99)
Potassium: 3.8 mmol/L (ref 3.5–5.1)
Sodium: 140 mmol/L (ref 135–145)
Total Bilirubin: 0.3 mg/dL (ref 0.3–1.2)
Total Protein: 7.4 g/dL (ref 6.5–8.1)

## 2020-01-05 MED ORDER — HEPARIN SOD (PORK) LOCK FLUSH 100 UNIT/ML IV SOLN
500.0000 [IU] | Freq: Once | INTRAVENOUS | Status: AC
  Administered 2020-01-05: 500 [IU] via INTRAVENOUS

## 2020-01-05 MED ORDER — SODIUM CHLORIDE (PF) 0.9 % IJ SOLN
INTRAMUSCULAR | Status: AC
  Filled 2020-01-05: qty 50

## 2020-01-05 MED ORDER — IOHEXOL 300 MG/ML  SOLN
100.0000 mL | Freq: Once | INTRAMUSCULAR | Status: AC | PRN
  Administered 2020-01-05: 100 mL via INTRAVENOUS

## 2020-01-05 MED ORDER — HEPARIN SOD (PORK) LOCK FLUSH 100 UNIT/ML IV SOLN
INTRAVENOUS | Status: AC
  Filled 2020-01-05: qty 5

## 2020-01-06 ENCOUNTER — Inpatient Hospital Stay (HOSPITAL_BASED_OUTPATIENT_CLINIC_OR_DEPARTMENT_OTHER): Payer: BC Managed Care – PPO | Admitting: Hematology and Oncology

## 2020-01-06 ENCOUNTER — Other Ambulatory Visit: Payer: Self-pay

## 2020-01-06 ENCOUNTER — Inpatient Hospital Stay: Payer: BC Managed Care – PPO

## 2020-01-06 ENCOUNTER — Encounter: Payer: Self-pay | Admitting: Hematology and Oncology

## 2020-01-06 VITALS — BP 127/82 | HR 90 | Temp 98.0°F | Resp 18 | Ht 62.0 in | Wt 229.0 lb

## 2020-01-06 DIAGNOSIS — R1900 Intra-abdominal and pelvic swelling, mass and lump, unspecified site: Secondary | ICD-10-CM | POA: Diagnosis not present

## 2020-01-06 DIAGNOSIS — R300 Dysuria: Secondary | ICD-10-CM | POA: Diagnosis not present

## 2020-01-06 DIAGNOSIS — M898X9 Other specified disorders of bone, unspecified site: Secondary | ICD-10-CM

## 2020-01-06 DIAGNOSIS — E8940 Asymptomatic postprocedural ovarian failure: Secondary | ICD-10-CM

## 2020-01-06 DIAGNOSIS — E8941 Symptomatic postprocedural ovarian failure: Secondary | ICD-10-CM

## 2020-01-06 DIAGNOSIS — C562 Malignant neoplasm of left ovary: Secondary | ICD-10-CM

## 2020-01-06 DIAGNOSIS — R635 Abnormal weight gain: Secondary | ICD-10-CM

## 2020-01-06 LAB — URINALYSIS, COMPLETE (UACMP) WITH MICROSCOPIC
Bilirubin Urine: NEGATIVE
Glucose, UA: NEGATIVE mg/dL
Hgb urine dipstick: NEGATIVE
Ketones, ur: NEGATIVE mg/dL
Nitrite: NEGATIVE
Protein, ur: 30 mg/dL — AB
Specific Gravity, Urine: 1.024 (ref 1.005–1.030)
pH: 5 (ref 5.0–8.0)

## 2020-01-06 LAB — CA 125: Cancer Antigen (CA) 125: 9.7 U/mL (ref 0.0–38.1)

## 2020-01-06 MED ORDER — OXYCODONE HCL 5 MG PO TABS: 5.0000 mg | ORAL_TABLET | ORAL | 0 refills | Status: DC | PRN

## 2020-01-06 MED ORDER — ESTRADIOL 0.05 MG/24HR TD PTWK: 0.0500 mg | MEDICATED_PATCH | TRANSDERMAL | 9 refills | Status: DC

## 2020-01-06 MED FILL — ESTRADIOL 0.05 MG/24HR PTWK: 0.05 | 84 days supply | Qty: 12 | Fill #0

## 2020-01-06 MED FILL — oxyCODONE HCL 5 MG TABS: 5 | 5 days supply | Qty: 30 | Fill #0

## 2020-01-06 NOTE — Assessment & Plan Note (Signed)
She has significant weight gain due to treatment We discussed importance of lifestyle modification and weight loss strategies

## 2020-01-06 NOTE — Assessment & Plan Note (Signed)
I have reviewed blood work and imaging studies with the patient She have no evidence of disease Per radiologist recommendation, I plan to repeat CT imaging in 3 months for further follow-up I recommend she proceed with Covid vaccination

## 2020-01-06 NOTE — Progress Notes (Signed)
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[place]) I have reviewed blood work and imaging studies with the patient She have no evidence of disease Per radiologist recommendation, I plan to repeat CT imaging in 3 months for further follow-up I recommend she proceed with Covid vaccination  Weight gain She has significant weight gain due to treatment We discussed importance of lifestyle modification and weight loss strategies  Hot flashes due to surgical menopause This is much improved with hormonal replacement therapy We will continue the same  Bone pain This is improving I refill her prescription oxycodone 1 more time  Dysuria She has unspecified dysuria I will order urinalysis and urine culture to rule out urinary tract infection I will call her next week for further follow-up   Orders Placed This Encounter  Procedures  . Urine Culture    Standing Status:   Future    Number of Occurrences:   1    Standing Expiration Date:   01/05/2021  . CT ABDOMEN PELVIS W CONTRAST    Standing Status:   Future    Standing Expiration Date:   01/05/2021    Order Specific Question:   If indicated for the ordered procedure, I authorize the administration of contrast media per Radiology protocol    Answer:   Yes    Order Specific Question:   Preferred imaging location?    Answer:   [facility]    Order Specific Question:   Radiology Contrast Protocol - do NOT remove file path    Answer:   [email or web address]    Order Specific Question:   Is patient pregnant?    Answer:   No  . Urinalysis, Complete w Microscopic    Standing Status:   Future    Number of Occurrences:   1    Standing Expiration Date:   01/05/2021  . CA 125    Standing Status:   Standing    Number of Occurrences:   22    Standing Expiration Date:   01/05/2021     All questions were answered. The patient knows to call the clinic with any problems, questions or concerns. The total time spent in the appointment was 30 minutes encounter with patients including review of chart and various tests results, discussions about plan of care and coordination of care plan   Heath Lark, MD 01/06/2020 1:19 PM  INTERVAL HISTORY: Please see below for problem oriented charting. 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 There is a 16 cm complex cystic mass in the pelvis containing thickened septations located near midline, abutting the superior aspect of the uterus. I suspect this mass is adnexal/ovarian in origin rather than uterine in origin. Specifically, I suspect the mass likely arises from the left ovary/adnexa and is most consistent with a neoplasm. Malignancy is certainly not excluded on this study. Recommend a pelvic ultrasound for further evaluation. 2. The rounded more solid-appearing structure in the right  side of the pelvis is probably the right ovary. Recommend attention on ultrasound. 3. The small amount of fluid in the pelvis is likely reactive to the complex cystic mass likely rising from the left ovary/adnexa. 4. No cause for blood in vomit or black stools identified. No convincing evidence of a perforated or inflamed gastric or duodenal ulcer. 5. Hepatic steatosis.   07/02/2019 Imaging   MRI pelvis 1. There is a large (15 cm) solid and cystic mass which appears to be arising from the left ovary concerning for cystic  ovarian neoplasm. There are 2 enhancing nodules within the central upper abdomen raising the possibility of peritoneal carcinomatosis. Additionally, there is a small amount fluid within the pelvis. Malignant ascites not excluded. 2. Fibroid uterus. 3. Hepatic steatosis.   07/02/2019 Imaging   US pelvis 1. There is a large mass in the pelvis also seen on CT imaging. A separate left ovary is not visualized. 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 The mass is suspicious for malignancy. Recommend gynecologic consultation. 2. The right ovary demonstrates an irregular ill-defined hypoechoic hypervascular region centrally. The findings are nonspecific in the right ovary. However, given the apparent left ovarian neoplasm suspicious for malignancy, recommend an MRI to further assess the right ovary. 3. Uterine fibroid measuring 3.6 cm.     07/21/2019 Pathology Results   FINAL MICROSCOPIC DIAGNOSIS: A. 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 - See oncology table. B. OMENTUM, RESECTION: - Omental lymph node with metastatic carcinoma (1/1). - Omental adipose tissue with foci of inflammation and reactive mesothelial changes. C. FALLOPIAN TUBE, LEFT, RESECTION: - Fallopian tube with focal, mild inflammation and fibrosis. - No tumor identified. D. UTERUS, CERVIX, RIGHT TUBE AND OVARY: - Cervix Nabothian cyst and squamous metaplasia. - Endometrium Proliferative. No hyperplasia or carcinoma. - Myometrium Leiomyomata. No malignancy. -Right ovary Poorly differentiated carcinoma, 4.8 cm. See oncology table and comment. -Right Fallopian tube Benign paratubal cyst. No endometriosis or malignancy. ONCOLOGY TABLE: OVARY or FALLOPIAN TUBE or PRIMARY PERITONEUM: Procedure: Bilateral f-oophorectomy, hysterectomy and omentectomy. Specimen Integrity: Intact Tumor Site: Left ovary and right ovary. See comment. Ovarian  Surface Involvement: Not identified. Fallopian Tube Surface Involvement: Not identified. Tumor Size: Left ovary: 18.6 x 16.0 x 7.2 cm. Right ovary: 4.8 x 3.2 x 3.2 cm. Histologic Type: Clear cell carcinoma. See comment. Histologic Grade: High-grade. Other Tissue/ Organ Involvement: Omental lymph node with metastatic carcinoma. Peritoneal/Ascitic Fluid: Pending. Treatment Effect: No known presurgical therapy. Pathologic Stage Classification (pTNM, AJCC 8th Edition): pT3a, pN1a. Representative Tumor Block: A2, A3, A4, A5 A6, A7 and A8. Comment(s): The left ovarian tumor is 18.6 cm in greatest dimension and has features of clear cell carcinoma. The right ovarian tumor is 4.8 cm in greatest dimension and is poorly differentiated with focal features consistent with clear cell carcinoma. The pattern of involvement suggests that the right ovarian tumor is likely metastatic from the larger left ovarian clear cell carcinoma. Sections of the omentum show a lymph node with metastatic carcinoma. Sections of the remainder of the omentum do not show involvement by carcinoma.   07/21/2019 Surgery   Procedure(s) Performed: Exploratory laparotomy with radical tumor debulking including total hysterectomy, bilateral salpingo-oophorectomy, infra-gastric omentectomy, and washings.   Surgeon: Jeral Pinch, MD    Operative Findings: On EUA, mobile uterus and ovarian mass, situated out of the pelvis, spanning 6-8 cm above the umbilicus.  On intra-abdominal entry, inflamed omentum adherent to much of the  16 cm left ovarian mass, adhesions easily lysed bluntly.  No nodularity of the ovarian mass however on delivery of the mass through the midline incision, there was Intra-Op rupture of one of the smaller cystic components with drainage of clear brown-tinged fluid.  Grossly normal-appearing left fallopian tube as well as right tube.  Right ovary mildly enlarged measuring approximately 4 cm and firm/nodular, suspicious for  tumor involvement.  Uterus 8-10 cm with 4 cm anterior mid body fibroid.  Some very small volume miliary disease along right pelvic sidewall, removed with uterine specimen.  Evidence of small diverticular disease.  Small bowel normal appearing although multiple loops of bowel adherent to each other, especially by mesentery, and an inflammatory manner.  An approximately 2 x 2 centimeter nodule suspicious for tumor implant was found in the omentum just inferior to the greater curvature of the stomach.  Additional small (less than 5 mm) implants noted on the stomach and posterior aspect of the left lobe of the liver.  Otherwise the liver surface and diaphragm were smooth.   08/03/2019 Cancer Staging   Staging form: Ovary, Fallopian Tube, and Primary Peritoneal Carcinoma, AJCC 8th Edition - Pathologic: Stage IIIA2 (pT3a, pN1, cM0) - Signed by Heath Lark, MD on 08/03/2019   08/12/2019 Procedure   Placement of single lumen port a cath via right internal jugular vein. The catheter tip lies at the cavo-atrial junction. A power injectable port a cath was placed and is ready for immediate use.   08/22/2019 -  Chemotherapy   The patient had carboplatin and taxol for chemotherapy treatment.     09/23/2019 Genetic Testing   Negative genetic testing:  No pathogenic variants detected on the [facility] TumorNext-HRD+CancerNext paired germline and somatic genetic test. The report date is 09/23/2019.  The TumorNext-HRD+CancerNext test offered by [facility] includes paired germline and tumor analyses of 11 genes associated with homologous recombination repair (ATM, BARD1, BRIP1, CHEK2, MRE11A, NBN, PALB2, RAD51C, RAD51D, BRCA1, BRCA2) plus germline analyses of 26 additional genes associated with hereditary cancer (APC, AXIN2, BMPR1A, CDH1, CDK4, CDKN2A, DICER1, HOXB13, EPCAM, GREM1, MLH1, MSH2, MSH3, MSH6, MUTYH, NF1, NTHL1, PMS2, POLD1, POLE, PTEN, RECQL, SMAD4, SMARCA4, STK11, and TP53).   11/14/2019 Tumor Marker   Patient's  tumor was tested for the following markers: CA-125 Results of the tumor marker test revealed 10.2.   12/05/2019 Tumor Marker   Patient's tumor was tested for the following markers: CA-125 Results of the tumor marker test revealed 9.9   01/05/2020 Imaging   No specific findings of active malignancy. Interval resection of the pelvic mass. Subtle nodularity along the right side of the vaginal cuff merits surveillance.     01/05/2020 Tumor Marker   Patient's tumor was tested for the following markers: CA-125 Results of the tumor marker test revealed 9.7     REVIEW OF SYSTEMS:   Constitutional: Denies fevers, chills or abnormal weight loss Eyes: Denies blurriness of vision Ears, nose, mouth, throat, and face: Denies mucositis or sore throat Respiratory: Denies cough, dyspnea or wheezes Cardiovascular: Denies palpitation, chest discomfort or lower extremity swelling Gastrointestinal:  Denies nausea, heartburn or change in bowel habits Skin: Denies abnormal skin rashes Lymphatics: Denies new lymphadenopathy or easy bruising Neurological:Denies numbness, tingling or new weaknesses Behavioral/Psych: Mood is stable, no new changes  All other systems were reviewed with the patient and are negative.  I have reviewed the past medical history, past surgical history, social history and family history with the patient and they are unchanged from previous note.  ALLERGIES:  has No Known Allergies.  MEDICATIONS:  Current Outpatient Medications  Medication Sig Dispense Refill  . acetaminophen (TYLENOL) 500 MG tablet Take 1,000 mg by mouth every 6 (six) hours as needed for moderate pain.     Marland Kitchen albuterol (PROVENTIL HFA;VENTOLIN HFA) 108 (90 Base) MCG/ACT inhaler Inhale 2 puffs into the lungs every 4 (four) hours as needed for wheezing or shortness of breath.    . estradiol (CLIMARA) 0.05 mg/24hr patch Place 1 patch (0.05 mg total) onto the skin once a week. Friday 12 patch 9  . famotidine (PEPCID) 20 MG  tablet Take 1 tablet (20 mg total) by mouth 2 (two) times daily. 60 tablet 1  . lidocaine-prilocaine (EMLA) cream Apply to affected area once 30 g 3  . metFORMIN (GLUCOPHAGE) 500 MG tablet TAKE 1 TABLET BY MOUTH 2 TIMES DAILY WITH A MEAL. 60 tablet 1  . Multiple Vitamin (MULTIVITAMIN WITH MINERALS) TABS tablet Take 1 tablet by mouth daily.    Marland Kitchen omeprazole (PRILOSEC) 20 MG capsule Take 40 mg by mouth daily as needed (acid).     . ondansetron (ZOFRAN) 8 MG tablet Take 1 tablet (8 mg total) by mouth every 8 (eight) hours as needed for refractory nausea / vomiting. 30 tablet 1  . oxyCODONE (OXY IR/ROXICODONE) 5 MG immediate release tablet Take 1 tablet (5 mg total) by mouth every 4 (four) hours as needed for severe pain. Do not take and drive 30 tablet 0  . prochlorperazine (COMPAZINE) 10 MG tablet Take 1 tablet (10 mg total) by mouth every 6 (six) hours as needed (Nausea or vomiting). 30 tablet 1  . sucralfate (CARAFATE) 1 g tablet Take 1 tablet (1 g total) by mouth 4 (four) times daily -  with meals and at bedtime. 90 tablet 1   No current facility-administered medications for this visit.    PHYSICAL EXAMINATION: ECOG PERFORMANCE STATUS: 1 - Symptomatic but completely ambulatory  Vitals:   01/06/20 0856  BP: 127/82  Pulse: 90  Resp: 18  Temp: 98 F (36.7 C)  SpO2: 99%   Filed Weights   01/06/20 0856  Weight: 229 lb (103.9 kg)    GENERAL:alert, no distress and comfortable NEURO: alert & oriented x 3 with fluent speech, no focal motor/sensory deficits  LABORATORY DATA:  I have reviewed the data as listed    Component Value Date/Time   NA 140 01/05/2020 0840   K 3.8 01/05/2020 0840   CL 105 01/05/2020 0840   CO2 24 01/05/2020 0840   GLUCOSE 114 (H) 01/05/2020 0840   BUN 8 01/05/2020 0840   CREATININE 0.73 01/05/2020 0840   CALCIUM 9.3 01/05/2020 0840   PROT 7.4 01/05/2020 0840   ALBUMIN 3.8 01/05/2020 0840   AST 28 01/05/2020 0840   ALT 31 01/05/2020 0840   ALKPHOS 98  01/05/2020 0840   BILITOT 0.3 01/05/2020 0840   GFRNONAA >60 01/05/2020 0840   GFRAA >60 01/05/2020 0840    No results found for: SPEP, UPEP  Lab Results  Component Value Date   WBC 4.5 01/05/2020   NEUTROABS 2.9 01/05/2020   HGB 10.2 (L) 01/05/2020   HCT 31.3 (L) 01/05/2020   MCV 87.2 01/05/2020   PLT 212 01/05/2020      Chemistry      Component Value Date/Time   NA 140 01/05/2020 0840   K 3.8 01/05/2020 0840   CL 105 01/05/2020 0840   CO2 24 01/05/2020 0840   BUN 8 01/05/2020 0840   CREATININE  0.73 01/05/2020 0840      Component Value Date/Time   CALCIUM 9.3 01/05/2020 0840   ALKPHOS 98 01/05/2020 0840   AST 28 01/05/2020 0840   ALT 31 01/05/2020 0840   BILITOT 0.3 01/05/2020 0840       RADIOGRAPHIC STUDIES: I have reviewed multiple imaging studies with the patient I have personally reviewed the radiological images as listed and agreed with the findings in the report. CT CHEST W CONTRAST  Result Date: 01/05/2020 CLINICAL DATA:  Left ovarian cancer originally diagnosed in December 2020, completed chemotherapy, surveillance imaging. EXAM: CT CHEST, ABDOMEN, AND PELVIS WITH CONTRAST TECHNIQUE: Multidetector CT imaging of the chest, abdomen and pelvis was performed following the standard protocol during bolus administration of intravenous contrast. CONTRAST:  185m OMNIPAQUE IOHEXOL 300 MG/ML  SOLN COMPARISON:  Multiple exams, including MRI from 07/02/2019 and CT scan from 07/02/2019 FINDINGS: CT CHEST FINDINGS Cardiovascular: Right Port-A-Cath tip: Cavoatrial junction. Mediastinum/Nodes: Unremarkable Lungs/Pleura: Unremarkable Musculoskeletal: Unremarkable CT ABDOMEN PELVIS FINDINGS Hepatobiliary: Cholecystectomy.  Otherwise unremarkable. Pancreas: Unremarkable Spleen: Unremarkable Adrenals/Urinary Tract: Unremarkable Stomach/Bowel: Unremarkable Vascular/Lymphatic: Unremarkable Reproductive: Uterus and ovaries absent. Interval resection of the large pelvic mass. Subtle  nodularity along the vaginal cuff eccentric to the right merits surveillance. Other: No significant ascites or obvious omental or mesenteric nodularity specific for peritoneal tumor spread at this time. Musculoskeletal: Degenerative facet arthropathy at L5-S1. IMPRESSION: 1. No specific findings of active malignancy. Interval resection of the pelvic mass. Subtle nodularity along the right side of the vaginal cuff merits surveillance. Electronically Signed   By: WVan ClinesM.D.   On: 01/05/2020 15:42   CT ABDOMEN PELVIS W CONTRAST  Result Date: 01/05/2020 CLINICAL DATA:  Left ovarian cancer originally diagnosed in December 2020, completed chemotherapy, surveillance imaging. EXAM: CT CHEST, ABDOMEN, AND PELVIS WITH CONTRAST TECHNIQUE: Multidetector CT imaging of the chest, abdomen and pelvis was performed following the standard protocol during bolus administration of intravenous contrast. CONTRAST:  101mOMNIPAQUE IOHEXOL 300 MG/ML  SOLN COMPARISON:  Multiple exams, including MRI from 07/02/2019 and CT scan from 07/02/2019 FINDINGS: CT CHEST FINDINGS Cardiovascular: Right Port-A-Cath tip: Cavoatrial junction. Mediastinum/Nodes: Unremarkable Lungs/Pleura: Unremarkable Musculoskeletal: Unremarkable CT ABDOMEN PELVIS FINDINGS Hepatobiliary: Cholecystectomy.  Otherwise unremarkable. Pancreas: Unremarkable Spleen: Unremarkable Adrenals/Urinary Tract: Unremarkable Stomach/Bowel: Unremarkable Vascular/Lymphatic: Unremarkable Reproductive: Uterus and ovaries absent. Interval resection of the large pelvic mass. Subtle nodularity along the vaginal cuff eccentric to the right merits surveillance. Other: No significant ascites or obvious omental or mesenteric nodularity specific for peritoneal tumor spread at this time. Musculoskeletal: Degenerative facet arthropathy at L5-S1. IMPRESSION: 1. No specific findings of active malignancy. Interval resection of the pelvic mass. Subtle nodularity along the right side of the  vaginal cuff merits surveillance. Electronically Signed   By: WaVan Clines.D.   On: 01/05/2020 15:42

## 2020-01-06 NOTE — Assessment & Plan Note (Signed)
This is improving I refill her prescription oxycodone 1 more time

## 2020-01-06 NOTE — Assessment & Plan Note (Signed)
She has unspecified dysuria I will order urinalysis and urine culture to rule out urinary tract infection I will call her next week for further follow-up

## 2020-01-06 NOTE — Assessment & Plan Note (Signed)
This is much improved with hormonal replacement therapy We will continue the same

## 2020-01-08 LAB — URINE CULTURE: Culture: >=100000 — AB

## 2020-01-09 ENCOUNTER — Other Ambulatory Visit: Payer: Self-pay | Admitting: *Deleted

## 2020-01-09 ENCOUNTER — Telehealth: Payer: Self-pay

## 2020-01-09 ENCOUNTER — Other Ambulatory Visit: Payer: Self-pay | Admitting: Hematology and Oncology

## 2020-01-09 ENCOUNTER — Telehealth: Payer: Self-pay | Admitting: Hematology and Oncology

## 2020-01-09 DIAGNOSIS — C562 Malignant neoplasm of left ovary: Secondary | ICD-10-CM

## 2020-01-09 DIAGNOSIS — N390 Urinary tract infection, site not specified: Secondary | ICD-10-CM | POA: Insufficient documentation

## 2020-01-09 DIAGNOSIS — N3000 Acute cystitis without hematuria: Secondary | ICD-10-CM

## 2020-01-09 MED ORDER — AMOXICILLIN 500 MG PO TABS
500.0000 mg | ORAL_TABLET | Freq: Two times a day (BID) | ORAL | 0 refills | Status: DC
Start: 2020-01-09 — End: 2020-05-29

## 2020-01-09 NOTE — Telephone Encounter (Signed)
-----   Message from Heath Lark, MD sent at 01/09/2020  4:31 AM EDT ----- Regarding: urine culture Pls call and let her know urine culture is positive for UTI, I sent antibiotics to [facility] Please call back in 1 week if not better

## 2020-01-09 NOTE — Telephone Encounter (Signed)
Called and left a message asking her to call the office back. 

## 2020-01-09 NOTE — Telephone Encounter (Signed)
Scheduled appt per 6/4 sch message - unable to reach pt . Left message with appt date and time.   

## 2020-01-09 NOTE — Telephone Encounter (Signed)
Called and given below message. She verbalized understanding. 

## 2020-01-12 ENCOUNTER — Ambulatory Visit: Payer: BC Managed Care – PPO | Attending: Internal Medicine

## 2020-01-12 DIAGNOSIS — Z23 Encounter for immunization: Secondary | ICD-10-CM

## 2020-01-12 NOTE — Progress Notes (Signed)
   Covid-19 Vaccination Clinic  Name:  Aivy Akter    MRN: [id number] DOB: [birth year]  01/12/2020  Ms. Curiale was observed post Covid-19 immunization for 15 minutes without incident. She was provided with Vaccine Information Sheet and instruction to access the V-Safe system.   Ms. Silliman was instructed to call 911 with any severe reactions post vaccine: Marland Kitchen Difficulty breathing  . Swelling of face and throat  . A fast heartbeat  . A bad rash all over body  . Dizziness and weakness   Immunizations Administered    Name Date Dose VIS Date Route   Pfizer COVID-19 Vaccine 01/12/2020  8:31 AM 0.3 mL 09/28/2018 Intramuscular   Manufacturer: [employer], [employer]   Lot: [id number]   [place]: [id number]

## 2020-01-16 ENCOUNTER — Telehealth: Payer: Self-pay

## 2020-01-16 MED FILL — METFORMIN HCL 500 MG TABS: 500 | 30 days supply | Qty: 60 | Fill #1

## 2020-01-16 NOTE — Telephone Encounter (Signed)
She called and left a message. She is still complaining of abdominal pain even after taking the antibiotic.  Called and left a message asking her to call the office back.

## 2020-01-17 ENCOUNTER — Other Ambulatory Visit: Payer: Self-pay | Admitting: Hematology and Oncology

## 2020-01-17 DIAGNOSIS — R1900 Intra-abdominal and pelvic swelling, mass and lump, unspecified site: Secondary | ICD-10-CM

## 2020-01-17 DIAGNOSIS — N3000 Acute cystitis without hematuria: Secondary | ICD-10-CM

## 2020-01-17 MED ORDER — OXYCODONE HCL 5 MG PO TABS: 5.0000 mg | ORAL_TABLET | ORAL | 0 refills | Status: DC | PRN

## 2020-01-17 MED FILL — oxyCODONE HCL 5 MG TABS: 5 | 8 days supply | Qty: 30 | Fill #0

## 2020-01-17 NOTE — Telephone Encounter (Signed)
I sent refill to [facility] 

## 2020-01-17 NOTE — Telephone Encounter (Signed)
Please ask her to come in for repeat UA and culture and ask if she needs oxycodone refill Will call her after urine culture is resulted prob in 2 days

## 2020-01-17 NOTE — Telephone Encounter (Signed)
Called and given below message. She verbalized understanding. She is in [place] today with work. Lab appt made for tomorrow at 4 pm. She is aware of time. She needs a refill on the Oxycodone Rx. She takes it at night and on the weekends.

## 2020-01-17 NOTE — Telephone Encounter (Signed)
Called back. She is complaining of lower abdominal pain above her bladder. She completed the antibiotics on Saturday. Denies burning with urination. She is having pressure in her bladder with urination. She has had no sexual activity because she in uncomfortable. She is taking tylenol prn and took a oxycodone last night for the pain.

## 2020-01-18 ENCOUNTER — Inpatient Hospital Stay: Payer: BC Managed Care – PPO

## 2020-01-19 ENCOUNTER — Other Ambulatory Visit: Payer: Self-pay

## 2020-01-19 ENCOUNTER — Inpatient Hospital Stay: Payer: BC Managed Care – PPO

## 2020-01-19 DIAGNOSIS — C562 Malignant neoplasm of left ovary: Secondary | ICD-10-CM

## 2020-01-19 DIAGNOSIS — N3000 Acute cystitis without hematuria: Secondary | ICD-10-CM

## 2020-01-19 DIAGNOSIS — R1900 Intra-abdominal and pelvic swelling, mass and lump, unspecified site: Secondary | ICD-10-CM

## 2020-01-19 LAB — URINALYSIS, COMPLETE (UACMP) WITH MICROSCOPIC
Bilirubin Urine: NEGATIVE
Glucose, UA: NEGATIVE mg/dL
Hgb urine dipstick: NEGATIVE
Ketones, ur: 5 mg/dL — AB
Leukocytes,Ua: NEGATIVE
Nitrite: NEGATIVE
Protein, ur: 30 mg/dL — AB
Specific Gravity, Urine: 1.029 (ref 1.005–1.030)
pH: 5 (ref 5.0–8.0)

## 2020-01-20 ENCOUNTER — Telehealth: Payer: Self-pay

## 2020-01-20 LAB — URINE CULTURE: Culture: NO GROWTH

## 2020-01-20 NOTE — Telephone Encounter (Signed)
Called and given below message. She verbalized understanding. She thinks she is feeling better and will try some pelvic floor exercises. Instructed to call back if appt needed.

## 2020-01-20 NOTE — Telephone Encounter (Signed)
-----   Message from Heath Lark, MD sent at 01/20/2020  9:40 AM EDT ----- Regarding: urine culture Urine culture is clear If she still have urinary symptoms, it could be related to vaginal atrophy/vaginitis

## 2020-02-02 ENCOUNTER — Ambulatory Visit: Payer: BC Managed Care – PPO | Attending: Internal Medicine

## 2020-02-02 DIAGNOSIS — Z23 Encounter for immunization: Secondary | ICD-10-CM

## 2020-02-02 NOTE — Progress Notes (Signed)
   Covid-19 Vaccination Clinic  Name:  Sarah Newman    MRN: [id number] DOB: [birth year]  02/02/2020  Sarah Newman was observed post Covid-19 immunization for 15 minutes without incident. She was provided with Vaccine Information Sheet and instruction to access the V-Safe system.   Sarah Newman was instructed to call 911 with any severe reactions post vaccine: Marland Kitchen Difficulty breathing  . Swelling of face and throat  . A fast heartbeat  . A bad rash all over body  . Dizziness and weakness   Immunizations Administered    Name Date Dose VIS Date Route   Pfizer COVID-19 Vaccine 02/02/2020  8:47 AM 0.3 mL 09/28/2018 Intramuscular   Manufacturer: [place]   Lot: [id number]   [place]: [id number]

## 2020-02-15 ENCOUNTER — Telehealth: Payer: Self-pay | Admitting: Oncology

## 2020-02-15 ENCOUNTER — Other Ambulatory Visit: Payer: Self-pay | Admitting: Hematology and Oncology

## 2020-02-15 DIAGNOSIS — C562 Malignant neoplasm of left ovary: Secondary | ICD-10-CM

## 2020-02-15 DIAGNOSIS — R739 Hyperglycemia, unspecified: Secondary | ICD-10-CM

## 2020-02-15 NOTE — Telephone Encounter (Signed)
Sarah Newman called and asked if a CA 125 and A1C can be drawn with her port flush on Friday.  Advised her that her CA 125 is on her standing orders but that we will need to ask about the A1C and call her back.

## 2020-02-15 NOTE — Telephone Encounter (Signed)
Called Sarah Newman and advised her that Dr. Alvy Bimler has ordered an A1C to be drawn.  Asked if her PCP recommended it and she said she just wanted to check it because she is following a low carb diet and taking metformin.  She said Dr. Alvy Bimler was watching it while she was on steroids during treatment.

## 2020-02-15 NOTE — Telephone Encounter (Signed)
I do not mind drawing it  I assume her PCP wants it? I will order it but she needs to give [place] a number to fax to PCP with results

## 2020-02-17 ENCOUNTER — Other Ambulatory Visit: Payer: Self-pay

## 2020-02-17 ENCOUNTER — Encounter: Payer: Self-pay | Admitting: *Deleted

## 2020-02-17 ENCOUNTER — Inpatient Hospital Stay: Payer: BC Managed Care – PPO

## 2020-02-17 ENCOUNTER — Inpatient Hospital Stay: Payer: BC Managed Care – PPO | Attending: Gynecologic Oncology

## 2020-02-17 DIAGNOSIS — R739 Hyperglycemia, unspecified: Secondary | ICD-10-CM | POA: Diagnosis not present

## 2020-02-17 DIAGNOSIS — R1900 Intra-abdominal and pelvic swelling, mass and lump, unspecified site: Secondary | ICD-10-CM | POA: Insufficient documentation

## 2020-02-17 DIAGNOSIS — C562 Malignant neoplasm of left ovary: Secondary | ICD-10-CM

## 2020-02-17 DIAGNOSIS — T50905A Adverse effect of unspecified drugs, medicaments and biological substances, initial encounter: Secondary | ICD-10-CM | POA: Insufficient documentation

## 2020-02-17 LAB — CBC WITH DIFFERENTIAL (CANCER CENTER ONLY)
Abs Immature Granulocytes: 0.01 10*3/uL (ref 0.00–0.07)
Basophils Absolute: 0 10*3/uL (ref 0.0–0.1)
Basophils Relative: 0 %
Eosinophils Absolute: 0.1 10*3/uL (ref 0.0–0.5)
Eosinophils Relative: 3 %
HCT: 33.8 % — ABNORMAL LOW (ref 36.0–46.0)
Hemoglobin: 11 g/dL — ABNORMAL LOW (ref 12.0–15.0)
Immature Granulocytes: 0 %
Lymphocytes Relative: 22 %
Lymphs Abs: 1.1 10*3/uL (ref 0.7–4.0)
MCH: 28.9 pg (ref 26.0–34.0)
MCHC: 32.5 g/dL (ref 30.0–36.0)
MCV: 88.7 fL (ref 80.0–100.0)
Monocytes Absolute: 0.3 10*3/uL (ref 0.1–1.0)
Monocytes Relative: 7 %
Neutro Abs: 3.5 10*3/uL (ref 1.7–7.7)
Neutrophils Relative %: 68 %
Platelet Count: 218 10*3/uL (ref 150–400)
RBC: 3.81 MIL/uL — ABNORMAL LOW (ref 3.87–5.11)
RDW: 13.4 % (ref 11.5–15.5)
WBC Count: 5.1 10*3/uL (ref 4.0–10.5)
nRBC: 0 % (ref 0.0–0.2)

## 2020-02-17 LAB — HEMOGLOBIN A1C
Hgb A1c MFr Bld: 5.7 % — ABNORMAL HIGH (ref 4.8–5.6)
Mean Plasma Glucose: 116.89 mg/dL

## 2020-02-17 LAB — CMP (CANCER CENTER ONLY)
ALT: 39 U/L (ref 0–44)
AST: 36 U/L (ref 15–41)
Albumin: 3.9 g/dL (ref 3.5–5.0)
Alkaline Phosphatase: 88 U/L (ref 38–126)
Anion gap: 10 (ref 5–15)
BUN: 11 mg/dL (ref 6–20)
CO2: 24 mmol/L (ref 22–32)
Calcium: 9.5 mg/dL (ref 8.9–10.3)
Chloride: 105 mmol/L (ref 98–111)
Creatinine: 0.66 mg/dL (ref 0.44–1.00)
GFR, Est AFR Am: >60 mL/min (ref >60)
GFR, Estimated: >60 mL/min (ref >60)
Glucose, Bld: 111 mg/dL — ABNORMAL HIGH (ref 70–99)
Potassium: 3.4 mmol/L — ABNORMAL LOW (ref 3.5–5.1)
Sodium: 139 mmol/L (ref 135–145)
Total Bilirubin: 0.4 mg/dL (ref 0.3–1.2)
Total Protein: 7.4 g/dL (ref 6.5–8.1)

## 2020-02-17 LAB — RESEARCH LABS

## 2020-02-17 MED ORDER — SODIUM CHLORIDE 0.9% FLUSH
10.0000 mL | Freq: Once | INTRAVENOUS | Status: AC
  Administered 2020-02-17: 10 mL
  Filled 2020-02-17: qty 10

## 2020-02-17 MED ORDER — HEPARIN SOD (PORK) LOCK FLUSH 100 UNIT/ML IV SOLN
250.0000 [IU] | Freq: Once | INTRAVENOUS | Status: AC
  Administered 2020-02-17: 500 [IU]
  Filled 2020-02-17: qty 5

## 2020-02-17 NOTE — Research (Signed)
02/17/2020 at 10:51am - S1714 week 24 study assessments  S1714, A PROSPECTIVE OBSERVATIONAL COHORT STUDY TO DEVELOP A PREDICTIVE MODEL OFTAXANE-INDUCED PERIPHERAL NEUROPATHY IN CANCER PATIENTS. 24 weeks Visit. Patient presented to the clinic, alone, forher routine lab and flush appointment.The research nurse met with the pt upon arrival to the cancer center. The pt verbalized that she did not have any neuropathy complaints.  PROs:Questionnairesweregiven to patient to complete in clinic prior toher appointments. Collected questionnaires and checked for completeness and accuracy. Labs:Optional whole blood for research collected todaythis morning along with her [facility] labs. Treatment: No treatment delays occurred throughout pt's chemo course.  Paclitaxel was given at a reduced dose of 140 mg/m2 for the past 5 cycles. The pt completed her 6th cycle of chemo on 12/05/2019.  The pt's cumulative taxol dosage is 1,836 mg.  History of Falls:pt denies any history of falls in the past 6 months.  Solicited Neuropathy Events:The pt denies any symptoms of neuropathy.   Assessment for Interventions for CIPN:Reviewed with patient andCRFs completed. The denied using the following medications:  Anti-depressants, Gabapentin, Narcotics ( pt denies taking ocycodone), Tramadol, and Duloxetine.  The pt reported using Advil Dual Action (combination acetaminophen 250 mg + ibuprofen 172m) less than 1 pill per week.  She said that she is still taking her [employer] "One a Day" Women's vitamin with vitamin E, B6, B12.  Pt reports recently starting "Fish Oil", but she does not know the dosage amount. She denies using topical agents.  She also denies any complementary and alternative medicines since her last study visit.   Neuropen Assessment:Completed per protocol bythiscertified researchnurse. T[place] Assessment:Completed per protocol by this cFilm/video editor time and documentationwith assistance from  researchnurse, AHendricks Limes Timed Get Up and Go Test: the pt completed the test in 9.8 seconds.  Plan:Informed patient of next study assessments inapproximately24weeks, for her52week assessment.  Plan for this visit to bein January 2022.  Patient denied having any questions at this time. Patient thanked for her time and contribution to study and was encouraged to call clinic for any questions or concerns she may have prior to her next appointment. NBrion AlimentRN, BSN, CCRP Clinical Research Nurse 02/17/2020 11:01 AM   Physician Assessments: Treatment Burden form completed and signed by Dr.Gorsuch on 02/20/20. Vitamins and Supplements:The research nurse called the pt to verify her Fish Oil dosage for study documentation purposes. The pt said that she would review her bottle this evening and call the nurse with her Fish Oil dosage.   NBrion AlimentRN, BSN, CCRP  Clinical Research Nurse 02/20/2020 10:49 AM

## 2020-02-17 NOTE — Patient Instructions (Signed)

## 2020-02-18 LAB — CA 125: Cancer Antigen (CA) 125: 9.6 U/mL (ref 0.0–38.1)

## 2020-02-20 ENCOUNTER — Telehealth: Payer: Self-pay

## 2020-02-20 NOTE — Telephone Encounter (Signed)
-----   Message from Heath Lark, MD sent at 02/20/2020  8:17 AM EDT ----- Regarding: let her know labs are ok, she requested hemoglobin A1c drawn, please send to PCP

## 2020-02-20 NOTE — Telephone Encounter (Signed)
Called and given below message and lab results. She verbalized understanding. She does not need Labs faxed to PCP.

## 2020-02-23 ENCOUNTER — Other Ambulatory Visit: Payer: Self-pay | Admitting: Hematology and Oncology

## 2020-02-24 MED FILL — METFORMIN HCL 500 MG TABS: 500 | 30 days supply | Qty: 60 | Fill #0

## 2020-04-05 ENCOUNTER — Ambulatory Visit (HOSPITAL_COMMUNITY)
Admission: RE | Admit: 2020-04-05 | Discharge: 2020-04-05 | Disposition: A | Discharge status: Home / Self Care | Payer: BC Managed Care – PPO | Source: Ambulatory Visit | Attending: Hematology and Oncology | Admitting: Hematology and Oncology

## 2020-04-05 ENCOUNTER — Inpatient Hospital Stay: Payer: BC Managed Care – PPO

## 2020-04-05 ENCOUNTER — Other Ambulatory Visit: Payer: Self-pay

## 2020-04-05 ENCOUNTER — Inpatient Hospital Stay: Payer: BC Managed Care – PPO | Attending: Gynecologic Oncology

## 2020-04-05 DIAGNOSIS — K760 Fatty (change of) liver, not elsewhere classified: Secondary | ICD-10-CM | POA: Diagnosis not present

## 2020-04-05 DIAGNOSIS — R55 Syncope and collapse: Secondary | ICD-10-CM | POA: Diagnosis not present

## 2020-04-05 DIAGNOSIS — M5136 Other intervertebral disc degeneration, lumbar region: Secondary | ICD-10-CM | POA: Diagnosis not present

## 2020-04-05 DIAGNOSIS — C562 Malignant neoplasm of left ovary: Secondary | ICD-10-CM

## 2020-04-05 DIAGNOSIS — R232 Flushing: Secondary | ICD-10-CM | POA: Diagnosis not present

## 2020-04-05 DIAGNOSIS — Z79890 Hormone replacement therapy (postmenopausal): Secondary | ICD-10-CM | POA: Diagnosis not present

## 2020-04-05 DIAGNOSIS — J984 Other disorders of lung: Secondary | ICD-10-CM | POA: Insufficient documentation

## 2020-04-05 DIAGNOSIS — K2970 Gastritis, unspecified, without bleeding: Secondary | ICD-10-CM | POA: Insufficient documentation

## 2020-04-05 DIAGNOSIS — N951 Menopausal and female climacteric states: Secondary | ICD-10-CM | POA: Diagnosis not present

## 2020-04-05 DIAGNOSIS — Z9049 Acquired absence of other specified parts of digestive tract: Secondary | ICD-10-CM | POA: Insufficient documentation

## 2020-04-05 DIAGNOSIS — Z79899 Other long term (current) drug therapy: Secondary | ICD-10-CM | POA: Diagnosis not present

## 2020-04-05 DIAGNOSIS — R1900 Intra-abdominal and pelvic swelling, mass and lump, unspecified site: Secondary | ICD-10-CM | POA: Diagnosis present

## 2020-04-05 LAB — CMP (CANCER CENTER ONLY)
ALT: 41 U/L (ref 0–44)
AST: 42 U/L — ABNORMAL HIGH (ref 15–41)
Albumin: 3.8 g/dL (ref 3.5–5.0)
Alkaline Phosphatase: 96 U/L (ref 38–126)
Anion gap: 9 (ref 5–15)
BUN: 9 mg/dL (ref 6–20)
CO2: 27 mmol/L (ref 22–32)
Calcium: 10.1 mg/dL (ref 8.9–10.3)
Chloride: 102 mmol/L (ref 98–111)
Creatinine: 0.68 mg/dL (ref 0.44–1.00)
GFR, Est AFR Am: >60 mL/min (ref >60)
GFR, Estimated: >60 mL/min (ref >60)
Glucose, Bld: 122 mg/dL — ABNORMAL HIGH (ref 70–99)
Potassium: 3.6 mmol/L (ref 3.5–5.1)
Sodium: 138 mmol/L (ref 135–145)
Total Bilirubin: 0.5 mg/dL (ref 0.3–1.2)
Total Protein: 7.5 g/dL (ref 6.5–8.1)

## 2020-04-05 LAB — CBC WITH DIFFERENTIAL (CANCER CENTER ONLY)
Abs Immature Granulocytes: 0.02 10*3/uL (ref 0.00–0.07)
Basophils Absolute: 0 10*3/uL (ref 0.0–0.1)
Basophils Relative: 0 %
Eosinophils Absolute: 0.1 10*3/uL (ref 0.0–0.5)
Eosinophils Relative: 2 %
HCT: 35.4 % — ABNORMAL LOW (ref 36.0–46.0)
Hemoglobin: 11.6 g/dL — ABNORMAL LOW (ref 12.0–15.0)
Immature Granulocytes: 0 %
Lymphocytes Relative: 23 %
Lymphs Abs: 1.7 10*3/uL (ref 0.7–4.0)
MCH: 28.4 pg (ref 26.0–34.0)
MCHC: 32.8 g/dL (ref 30.0–36.0)
MCV: 86.8 fL (ref 80.0–100.0)
Monocytes Absolute: 0.4 10*3/uL (ref 0.1–1.0)
Monocytes Relative: 6 %
Neutro Abs: 4.9 10*3/uL (ref 1.7–7.7)
Neutrophils Relative %: 69 %
Platelet Count: 255 10*3/uL (ref 150–400)
RBC: 4.08 MIL/uL (ref 3.87–5.11)
RDW: 12.7 % (ref 11.5–15.5)
WBC Count: 7.1 10*3/uL (ref 4.0–10.5)
nRBC: 0 % (ref 0.0–0.2)

## 2020-04-05 MED ORDER — IOHEXOL 300 MG/ML  SOLN
100.0000 mL | Freq: Once | INTRAMUSCULAR | Status: AC | PRN
  Administered 2020-04-05: 100 mL via INTRAVENOUS

## 2020-04-05 MED ORDER — HEPARIN SOD (PORK) LOCK FLUSH 100 UNIT/ML IV SOLN
500.0000 [IU] | Freq: Once | INTRAVENOUS | Status: AC
  Administered 2020-04-05: 500 [IU] via INTRAVENOUS

## 2020-04-05 MED ORDER — HEPARIN SOD (PORK) LOCK FLUSH 100 UNIT/ML IV SOLN
INTRAVENOUS | Status: AC
  Filled 2020-04-05: qty 5

## 2020-04-05 MED ORDER — SODIUM CHLORIDE 0.9% FLUSH
10.0000 mL | Freq: Once | INTRAVENOUS | Status: AC
  Administered 2020-04-05: 10 mL
  Filled 2020-04-05: qty 10

## 2020-04-05 NOTE — Progress Notes (Signed)
Left pt accessed for CT. Advised pt to make sure she does not go home with needle in. Pt verbalized understanding.

## 2020-04-06 ENCOUNTER — Other Ambulatory Visit: Payer: Self-pay | Admitting: Hematology and Oncology

## 2020-04-06 ENCOUNTER — Telehealth: Payer: Self-pay | Admitting: Hematology and Oncology

## 2020-04-06 ENCOUNTER — Other Ambulatory Visit: Payer: Self-pay

## 2020-04-06 ENCOUNTER — Encounter: Payer: Self-pay | Admitting: Hematology and Oncology

## 2020-04-06 ENCOUNTER — Telehealth: Payer: Self-pay

## 2020-04-06 ENCOUNTER — Inpatient Hospital Stay (HOSPITAL_BASED_OUTPATIENT_CLINIC_OR_DEPARTMENT_OTHER): Payer: BC Managed Care – PPO | Admitting: Hematology and Oncology

## 2020-04-06 DIAGNOSIS — E8941 Symptomatic postprocedural ovarian failure: Secondary | ICD-10-CM | POA: Diagnosis not present

## 2020-04-06 DIAGNOSIS — K2900 Acute gastritis without bleeding: Secondary | ICD-10-CM | POA: Diagnosis not present

## 2020-04-06 DIAGNOSIS — C562 Malignant neoplasm of left ovary: Secondary | ICD-10-CM

## 2020-04-06 LAB — CA 125: Cancer Antigen (CA) 125: 11 U/mL (ref 0.0–38.1)

## 2020-04-06 MED ORDER — PANTOPRAZOLE SODIUM 40 MG PO TBEC: 40.0000 mg | DELAYED_RELEASE_TABLET | Freq: Every day | ORAL | 11 refills | Status: DC

## 2020-04-06 MED FILL — PANTOPRAZOLE SOD DR 40 MG T: 40 | 30 days supply | Qty: 30 | Fill #0

## 2020-04-06 NOTE — Progress Notes (Signed)
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   All questions were answered. The patient knows to call the clinic with any problems, questions or concerns. The total time spent in the appointment was 20 minutes encounter with patients including review of chart and various tests results, discussions about plan of care and coordination of care plan   Heath Lark, MD 04/06/2020 3:36 PM  INTERVAL HISTORY: Please see below for problem oriented charting. 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 There is a 16 cm complex cystic mass in the pelvis containing thickened septations located near midline, abutting the superior aspect of the uterus. I suspect this mass is adnexal/ovarian in origin rather than uterine in origin. Specifically, I suspect the mass  likely arises from the left ovary/adnexa and is most consistent with a neoplasm. Malignancy is certainly not excluded on this study. Recommend a pelvic ultrasound for further evaluation. 2. The rounded more solid-appearing structure in the right side of the pelvis is probably the right ovary. Recommend attention on ultrasound. 3. The small amount of fluid in the pelvis is likely reactive to the complex cystic mass likely rising from the left ovary/adnexa. 4. No cause for blood in vomit or black stools identified. No convincing evidence of a perforated or inflamed gastric or duodenal ulcer. 5. Hepatic steatosis.   07/02/2019 Imaging   MRI pelvis 1. 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 There are 2 enhancing nodules within the central upper abdomen raising the possibility of peritoneal carcinomatosis. Additionally, there is a small amount fluid within the pelvis. Malignant ascites not excluded. 2. Fibroid uterus. 3. Hepatic steatosis.   07/02/2019 Imaging   US pelvis 1. There is a large mass in the pelvis also seen on CT imaging. A separate left ovary is not visualized. 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 The mass is suspicious for malignancy. Recommend gynecologic consultation. 2. The right ovary demonstrates an irregular ill-defined hypoechoic hypervascular region centrally. The findings are nonspecific in the right ovary. However, given the apparent left ovarian neoplasm suspicious for malignancy, recommend an MRI to further assess the right ovary. 3. Uterine fibroid measuring 3.6 cm.     07/21/2019 Pathology Results   FINAL MICROSCOPIC DIAGNOSIS: A. 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 - See oncology table. B. OMENTUM, RESECTION: - Omental lymph node with metastatic carcinoma (1/1). - Omental adipose  tissue with foci of inflammation and reactive mesothelial changes. C. FALLOPIAN TUBE, LEFT, RESECTION: - Fallopian tube with focal, mild inflammation and fibrosis. - No tumor identified. D. UTERUS, CERVIX, RIGHT TUBE AND OVARY: - Cervix Nabothian cyst and squamous metaplasia. - Endometrium Proliferative. No hyperplasia or carcinoma. - Myometrium Leiomyomata. No malignancy. -Right ovary Poorly differentiated carcinoma, 4.8 cm. See oncology table and comment. -Right Fallopian tube Benign paratubal cyst. No endometriosis or malignancy. ONCOLOGY TABLE: OVARY or FALLOPIAN TUBE or PRIMARY PERITONEUM: Procedure: Bilateral f-oophorectomy, hysterectomy and omentectomy. Specimen Integrity: Intact Tumor Site: Left ovary and right ovary. See comment. Ovarian Surface Involvement: Not identified. Fallopian Tube Surface Involvement: Not identified. Tumor Size: Left ovary: 18.6 x 16.0 x 7.2 cm. Right ovary: 4.8 x 3.2 x 3.2 cm. Histologic Type: Clear cell carcinoma. See comment. Histologic Grade: High-grade. Other Tissue/ Organ Involvement: Omental lymph node with metastatic carcinoma. Peritoneal/Ascitic Fluid: Pending. Treatment Effect: No known presurgical therapy. Pathologic Stage Classification (pTNM, AJCC 8th Edition): pT3a, pN1a. Representative Tumor Block: A2, A3, A4, A5 A6, A7 and A8. Comment(s): The left ovarian tumor is 18.6 cm in greatest dimension and has features of clear cell carcinoma. The right ovarian tumor is 4.8 cm in greatest dimension and is poorly differentiated with focal features consistent with clear cell carcinoma. The pattern of involvement suggests that the right ovarian tumor is likely metastatic from the larger left ovarian clear cell carcinoma. Sections of the omentum show a lymph node with metastatic carcinoma. Sections of the remainder of the omentum do not show involvement by carcinoma.   07/21/2019 Surgery   Procedure(s) Performed: Exploratory laparotomy with  radical tumor debulking including total hysterectomy, bilateral salpingo-oophorectomy, infra-gastric omentectomy, and washings.   Surgeon: Jeral Pinch, MD    Operative Findings: On EUA, mobile uterus and ovarian mass, situated out of the pelvis, spanning 6-8 cm above the umbilicus.  On intra-abdominal entry, inflamed omentum adherent to much of the 16 cm left ovarian mass, adhesions easily lysed bluntly.  No nodularity of the ovarian mass however on delivery of the mass through the midline incision, there was Intra-Op rupture of one of the smaller cystic components with drainage of clear brown-tinged fluid.  Grossly normal-appearing left fallopian tube as well as right tube.  Right ovary mildly enlarged measuring approximately 4 cm and firm/nodular, suspicious for tumor involvement.  Uterus 8-10 cm with 4 cm anterior mid body fibroid.  Some very small volume miliary disease along right pelvic sidewall, removed with uterine specimen.  Evidence of small diverticular disease.  Small bowel normal appearing although multiple loops of bowel adherent to each other, especially by mesentery, and an inflammatory manner.  An approximately 2 x 2 centimeter nodule suspicious for tumor implant was found in the omentum just inferior to the greater curvature of the stomach.  Additional small (less than 5 mm) implants noted on the stomach and posterior aspect of the left lobe of the liver.  Otherwise the liver surface and diaphragm were smooth.   08/03/2019 Cancer Staging   Staging form: Ovary, Fallopian Tube, and Primary Peritoneal Carcinoma, AJCC 8th Edition - Pathologic: Stage IIIA2 (pT3a, pN1, cM0) - Signed by Heath Lark, MD on 08/03/2019   08/12/2019 Procedure   Placement of single lumen port a cath via right internal jugular vein. The catheter tip lies at the cavo-atrial junction. A power injectable port  a cath was placed and is ready for immediate use.   08/22/2019 -  Chemotherapy   The patient had  carboplatin and taxol for chemotherapy treatment.     09/23/2019 Genetic Testing   Negative genetic testing:  No pathogenic variants detected on the [facility] TumorNext-HRD+CancerNext paired germline and somatic genetic test. The report date is 09/23/2019.  The TumorNext-HRD+CancerNext test offered by [facility] includes paired germline and tumor analyses of 11 genes associated with homologous recombination repair (ATM, BARD1, BRIP1, CHEK2, MRE11A, NBN, PALB2, RAD51C, RAD51D, BRCA1, BRCA2) plus germline analyses of 26 additional genes associated with hereditary cancer (APC, AXIN2, BMPR1A, CDH1, CDK4, CDKN2A, DICER1, HOXB13, EPCAM, GREM1, MLH1, MSH2, MSH3, MSH6, MUTYH, NF1, NTHL1, PMS2, POLD1, POLE, PTEN, RECQL, SMAD4, SMARCA4, STK11, and TP53).   11/14/2019 Tumor Marker   Patient's tumor was tested for the following markers: CA-125 Results of the tumor marker test revealed 10.2.   12/05/2019 Tumor Marker   Patient's tumor was tested for the following markers: CA-125 Results of the tumor marker test revealed 9.9   01/05/2020 Imaging   No specific findings of active malignancy. Interval resection of the pelvic mass. Subtle nodularity along the right side of the vaginal cuff merits surveillance.     01/05/2020 Tumor Marker   Patient's tumor was tested for the following markers: CA-125 Results of the tumor marker test revealed 9.7   02/17/2020 Tumor Marker   Patient's tumor was tested for the following markers: CA-125 Results of the tumor marker test revealed 9.6   04/05/2020 Imaging   1. There is some minimal stranding along the mesentery and omentum without overt omental caking or well-defined nodularity. Faint bandlike density along the anterior peritoneal reflection in the pelvis, slightly more notable than on prior. Overall the appearance is not considered specific for recurrence but given the slight increase in prominence from 01/05/2020, would encourage careful surveillance by tumor markers and  imaging. 2. Mild lower lumbar degenerative facet arthropathy.   04/05/2020 Tumor Marker   Patient's tumor was tested for the following markers: CA-125 Results of the tumor marker test revealed 11     REVIEW OF SYSTEMS:   Constitutional: Denies fevers, chills or abnormal weight loss Eyes: Denies blurriness of vision Ears, nose, mouth, throat, and face: Denies mucositis or sore throat Respiratory: Denies cough, dyspnea or wheezes Cardiovascular: Denies palpitation, chest discomfort or lower extremity swelling Gastrointestinal:  Denies nausea, heartburn or change in bowel habits Skin: Denies abnormal skin rashes Lymphatics: Denies new lymphadenopathy or easy bruising Neurological:Denies numbness, tingling or new weaknesses Behavioral/Psych: Mood is stable, no new changes  All other systems were reviewed with the patient and are negative.  I have reviewed the past medical history, past surgical history, social history and family history with the patient and they are unchanged from previous note.  ALLERGIES:  has No Known Allergies.  MEDICATIONS:  Current Outpatient Medications  Medication Sig Dispense Refill  . acetaminophen (TYLENOL) 500 MG tablet Take 1,000 mg by mouth every 6 (six) hours as needed for moderate pain.     Marland Kitchen albuterol (PROVENTIL HFA;VENTOLIN HFA) 108 (90 Base) MCG/ACT inhaler Inhale 2 puffs into the lungs every 4 (four) hours as needed for wheezing or shortness of breath.    Marland Kitchen amoxicillin (AMOXIL) 500 MG tablet Take 1 tablet (500 mg total) by mouth 2 (two) times daily. 10 tablet 0  . estradiol (CLIMARA) 0.05 mg/24hr patch Place 1 patch (0.05 mg total) onto the skin once a week. Friday 12 patch 9  . famotidine (  PEPCID) 20 MG tablet Take 1 tablet (20 mg total) by mouth 2 (two) times daily. 60 tablet 1  . lidocaine-prilocaine (EMLA) cream Apply to affected area once 30 g 3  . metFORMIN (GLUCOPHAGE) 500 MG tablet TAKE 1 TABLET BY MOUTH 2 TIMES DAILY WITH A MEAL. 60 tablet 1   . Multiple Vitamin (MULTIVITAMIN WITH MINERALS) TABS tablet Take 1 tablet by mouth daily.    Marland Kitchen oxyCODONE (OXY IR/ROXICODONE) 5 MG immediate release tablet Take 1 tablet (5 mg total) by mouth every 4 (four) hours as needed for severe pain. Do not take and drive 30 tablet 0  . pantoprazole (PROTONIX) 40 MG tablet Take 1 tablet (40 mg total) by mouth daily. 30 tablet 11   No current facility-administered medications for this visit.    PHYSICAL EXAMINATION: ECOG PERFORMANCE STATUS: 1 - Symptomatic but completely ambulatory  Vitals:   04/06/20 0907  BP: 119/78  Pulse: 84  Resp: 18  Temp: (!) 97.4 F (36.3 C)  SpO2: 98%   Filed Weights   04/06/20 0907  Weight: 222 lb 3.2 oz (100.8 kg)    GENERAL:alert, no distress and comfortable SKIN: skin color, texture, turgor are normal, no rashes or significant lesions EYES: normal, Conjunctiva are pink and non-injected, sclera clear OROPHARYNX:no exudate, no erythema and lips, buccal mucosa, and tongue normal  NECK: supple, thyroid normal size, non-tender, without nodularity LYMPH:  no palpable lymphadenopathy in the cervical, axillary or inguinal LUNGS: clear to auscultation and percussion with normal breathing effort HEART: regular rate & rhythm and no murmurs and no lower extremity edema ABDOMEN:abdomen soft, non-tender and normal bowel sounds Musculoskeletal:no cyanosis of digits and no clubbing  NEURO: alert & oriented x 3 with fluent speech, no focal motor/sensory deficits  LABORATORY DATA:  I have reviewed the data as listed    Component Value Date/Time   NA 138 04/05/2020 1008   K 3.6 04/05/2020 1008   CL 102 04/05/2020 1008   CO2 27 04/05/2020 1008   GLUCOSE 122 (H) 04/05/2020 1008   BUN 9 04/05/2020 1008   CREATININE 0.68 04/05/2020 1008   CALCIUM 10.1 04/05/2020 1008   PROT 7.5 04/05/2020 1008   ALBUMIN 3.8 04/05/2020 1008   AST 42 (H) 04/05/2020 1008   ALT 41 04/05/2020 1008   ALKPHOS 96 04/05/2020 1008   BILITOT 0.5  04/05/2020 1008   GFRNONAA >60 04/05/2020 1008   GFRAA >60 04/05/2020 1008    No results found for: SPEP, UPEP  Lab Results  Component Value Date   WBC 7.1 04/05/2020   NEUTROABS 4.9 04/05/2020   HGB 11.6 (L) 04/05/2020   HCT 35.4 (L) 04/05/2020   MCV 86.8 04/05/2020   PLT 255 04/05/2020      Chemistry      Component Value Date/Time   NA 138 04/05/2020 1008   K 3.6 04/05/2020 1008   CL 102 04/05/2020 1008   CO2 27 04/05/2020 1008   BUN 9 04/05/2020 1008   CREATININE 0.68 04/05/2020 1008      Component Value Date/Time   CALCIUM 10.1 04/05/2020 1008   ALKPHOS 96 04/05/2020 1008   AST 42 (H) 04/05/2020 1008   ALT 41 04/05/2020 1008   BILITOT 0.5 04/05/2020 1008       RADIOGRAPHIC STUDIES: I have reviewed multiple CT imaging with the patient I have personally reviewed the radiological images as listed and agreed with the findings in the report. CT ABDOMEN PELVIS W CONTRAST  Result Date: 04/05/2020 CLINICAL DATA:  Surveillance  of ovarian cancer, chemotherapy completed in May 2021. Mid abdominal pain. EXAM: CT ABDOMEN AND PELVIS WITH CONTRAST TECHNIQUE: Multidetector CT imaging of the abdomen and pelvis was performed using the standard protocol following bolus administration of intravenous contrast. CONTRAST:  123m OMNIPAQUE IOHEXOL 300 MG/ML  SOLN COMPARISON:  01/05/2020 FINDINGS: Lower chest: Mild scarring medially in the right middle lobe. Minimal scarring in the left upper lobe with accessory fissure noted. Right central line tip: Cavoatrial junction. Hepatobiliary: Prior cholecystectomy. Common bile duct 0.8 cm in diameter, chronic and likely a physiologic response to cholecystectomy. Otherwise unremarkable. Pancreas: Unremarkable Spleen: Unremarkable Adrenals/Urinary Tract: Unremarkable. There is stable vascular calcifications in the vicinity of the distal ureters but without definite intraureteral calculus without hydronephrosis or hydroureter. Stomach/Bowel: Unremarkable  Vascular/Lymphatic: Right gastric lymph nodes measure up to 0.7 cm in short axis, but no overt pathologic adenopathy is identified. Reproductive: Uterus absent. Stable subtle nodularity along the vaginal cuff, right greater than left. Other: There is some minimal stranding along the mesentery and omentum without overt omental caking or well-defined nodularity. Faint bandlike density along the anterior peritoneal reflection in the pelvis, for example on image 68/2, slightly more notable than on prior. Musculoskeletal: Degenerative facet arthropathy bilaterally at L5-S1. IMPRESSION: 1. There is some minimal stranding along the mesentery and omentum without overt omental caking or well-defined nodularity. Faint bandlike density along the anterior peritoneal reflection in the pelvis, slightly more notable than on prior. Overall the appearance is not considered specific for recurrence but given the slight increase in prominence from 01/05/2020, would encourage careful surveillance by tumor markers and imaging. 2. Mild lower lumbar degenerative facet arthropathy. Electronically Signed   By: WVan ClinesM.D.   On: 04/05/2020 12:56

## 2020-04-06 NOTE — Assessment & Plan Note (Addendum)
She is taking estrogen replacement therapy as needed She is concerned about weight gain We discussed the importance of dietary modification and lifestyle changes while on

## 2020-04-06 NOTE — Assessment & Plan Note (Signed)
Suspect her abdominal symptoms is due to gastritis I recommend high-dose pantoprazole I sent prescription to the pharmacy for her to take as needed

## 2020-04-06 NOTE — Telephone Encounter (Signed)
Scheduled appts per 9/3 sch msg. Pt declined print out of AVS and stated she would refer to mychart.

## 2020-04-06 NOTE — Telephone Encounter (Signed)
Per Dr. Alvy Bimler Pt. Informed CA-125 is ok. Pt. asked for value (11.0). Value given. Pt. Verbalized understanding.

## 2020-04-06 NOTE — Telephone Encounter (Signed)
-----   Message from Heath Lark, MD sent at 04/06/2020  2:39 PM EDT ----- Regarding: pls let her know CA-125 is ok

## 2020-04-06 NOTE — Assessment & Plan Note (Signed)
CT imaging and blood work is unremarkable I do not believe her abdominal pain is related Plan to see her again in a few weeks for further follow-up

## 2020-04-18 MED FILL — METFORMIN HCL 500 MG TABS: 500 | 30 days supply | Qty: 60 | Fill #1

## 2020-04-18 MED FILL — PANTOPRAZOLE SOD DR 40 MG T: 40 | 30 days supply | Qty: 30 | Fill #0

## 2020-05-14 ENCOUNTER — Other Ambulatory Visit: Payer: Self-pay | Admitting: Hematology and Oncology

## 2020-05-14 MED FILL — PANTOPRAZOLE SOD DR 40 MG T: 40 | 30 days supply | Qty: 30 | Fill #1

## 2020-05-14 MED FILL — METFORMIN HCL 500 MG TABS: 500 | 30 days supply | Qty: 60 | Fill #0

## 2020-05-25 ENCOUNTER — Encounter: Payer: Self-pay | Admitting: Hematology and Oncology

## 2020-05-25 ENCOUNTER — Inpatient Hospital Stay: Payer: 59 | Admitting: Hematology and Oncology

## 2020-05-25 ENCOUNTER — Inpatient Hospital Stay: Payer: 59

## 2020-05-29 ENCOUNTER — Telehealth: Payer: Self-pay

## 2020-05-29 ENCOUNTER — Inpatient Hospital Stay (HOSPITAL_BASED_OUTPATIENT_CLINIC_OR_DEPARTMENT_OTHER): Payer: 59 | Admitting: Hematology and Oncology

## 2020-05-29 ENCOUNTER — Inpatient Hospital Stay: Payer: 59 | Attending: Gynecologic Oncology

## 2020-05-29 ENCOUNTER — Encounter: Payer: Self-pay | Admitting: Hematology and Oncology

## 2020-05-29 ENCOUNTER — Other Ambulatory Visit: Payer: Self-pay

## 2020-05-29 ENCOUNTER — Other Ambulatory Visit: Payer: Self-pay | Admitting: Hematology and Oncology

## 2020-05-29 ENCOUNTER — Inpatient Hospital Stay: Payer: 59

## 2020-05-29 VITALS — BP 139/84 | HR 81 | Temp 97.8°F | Resp 18 | Ht 62.0 in | Wt 218.8 lb

## 2020-05-29 DIAGNOSIS — R7303 Prediabetes: Secondary | ICD-10-CM | POA: Diagnosis not present

## 2020-05-29 DIAGNOSIS — Z79899 Other long term (current) drug therapy: Secondary | ICD-10-CM | POA: Diagnosis not present

## 2020-05-29 DIAGNOSIS — D638 Anemia in other chronic diseases classified elsewhere: Secondary | ICD-10-CM | POA: Diagnosis not present

## 2020-05-29 DIAGNOSIS — C562 Malignant neoplasm of left ovary: Secondary | ICD-10-CM

## 2020-05-29 DIAGNOSIS — K2900 Acute gastritis without bleeding: Secondary | ICD-10-CM | POA: Diagnosis not present

## 2020-05-29 DIAGNOSIS — T50905A Adverse effect of unspecified drugs, medicaments and biological substances, initial encounter: Secondary | ICD-10-CM

## 2020-05-29 DIAGNOSIS — R739 Hyperglycemia, unspecified: Secondary | ICD-10-CM

## 2020-05-29 DIAGNOSIS — K2970 Gastritis, unspecified, without bleeding: Secondary | ICD-10-CM | POA: Insufficient documentation

## 2020-05-29 DIAGNOSIS — R1900 Intra-abdominal and pelvic swelling, mass and lump, unspecified site: Secondary | ICD-10-CM

## 2020-05-29 LAB — CBC WITH DIFFERENTIAL (CANCER CENTER ONLY)
Abs Immature Granulocytes: 0.01 10*3/uL (ref 0.00–0.07)
Basophils Absolute: 0 10*3/uL (ref 0.0–0.1)
Basophils Relative: 0 %
Eosinophils Absolute: 0.3 10*3/uL (ref 0.0–0.5)
Eosinophils Relative: 4 %
HCT: 35.4 % — ABNORMAL LOW (ref 36.0–46.0)
Hemoglobin: 11.4 g/dL — ABNORMAL LOW (ref 12.0–15.0)
Immature Granulocytes: 0 %
Lymphocytes Relative: 23 %
Lymphs Abs: 1.4 10*3/uL (ref 0.7–4.0)
MCH: 28.4 pg (ref 26.0–34.0)
MCHC: 32.2 g/dL (ref 30.0–36.0)
MCV: 88.1 fL (ref 80.0–100.0)
Monocytes Absolute: 0.4 10*3/uL (ref 0.1–1.0)
Monocytes Relative: 6 %
Neutro Abs: 4.1 10*3/uL (ref 1.7–7.7)
Neutrophils Relative %: 67 %
Platelet Count: 270 10*3/uL (ref 150–400)
RBC: 4.02 MIL/uL (ref 3.87–5.11)
RDW: 13 % (ref 11.5–15.5)
WBC Count: 6.2 10*3/uL (ref 4.0–10.5)
nRBC: 0 % (ref 0.0–0.2)

## 2020-05-29 LAB — CMP (CANCER CENTER ONLY)
ALT: 35 U/L (ref 0–44)
AST: 32 U/L (ref 15–41)
Albumin: 3.9 g/dL (ref 3.5–5.0)
Alkaline Phosphatase: 85 U/L (ref 38–126)
Anion gap: 10 (ref 5–15)
BUN: 8 mg/dL (ref 6–20)
CO2: 29 mmol/L (ref 22–32)
Calcium: 9.7 mg/dL (ref 8.9–10.3)
Chloride: 102 mmol/L (ref 98–111)
Creatinine: 0.75 mg/dL (ref 0.44–1.00)
GFR, Estimated: >60 mL/min (ref >60)
Glucose, Bld: 108 mg/dL — ABNORMAL HIGH (ref 70–99)
Potassium: 3.7 mmol/L (ref 3.5–5.1)
Sodium: 141 mmol/L (ref 135–145)
Total Bilirubin: 0.6 mg/dL (ref 0.3–1.2)
Total Protein: 7.1 g/dL (ref 6.5–8.1)

## 2020-05-29 MED ORDER — SUCRALFATE 1 G PO TABS: 1.0000 g | ORAL_TABLET | Freq: Three times a day (TID) | ORAL | 1 refills | Status: DC

## 2020-05-29 MED ORDER — PANTOPRAZOLE SODIUM 40 MG PO TBEC
40.0000 mg | DELAYED_RELEASE_TABLET | Freq: Two times a day (BID) | ORAL | 11 refills | Status: DC
Start: 2020-05-29 — End: 2020-08-29

## 2020-05-29 MED FILL — SUCRALFATE 1 GM TAB: 1 | 30 days supply | Qty: 90 | Fill #0

## 2020-05-29 NOTE — Assessment & Plan Note (Signed)
Her blood sugar is elevated, at the prediabetic range She will continue Metformin

## 2020-05-29 NOTE — Telephone Encounter (Signed)
She called back. She saw Dr. Amedeo Plenty at [place]. Dr. Amedeo Plenty is no longer at [facility].

## 2020-05-29 NOTE — Telephone Encounter (Signed)
Called and given below message. She verbalized understanding. She will call the office back with the same of the MD.

## 2020-05-29 NOTE — Telephone Encounter (Signed)
-----   Message from Heath Lark, MD sent at 05/29/2020  1:48 PM EDT ----- Regarding: [facility] She told me she had endoscopy done by [facility] near [facility] Can you call and ask who did she see? I want to make a referral

## 2020-05-29 NOTE — Assessment & Plan Note (Signed)
Her recent blood work and CT imaging show no evidence of disease Her epigastric discomfort is unrelated to her previous cancer diagnosis I will call her with tumor marker results If her CA-125 is stable, I do not plan to repeat CT imaging until early next year I will see her back in December for further follow-up

## 2020-05-29 NOTE — Telephone Encounter (Signed)
Called referral to [facility] and faxed referral information to [phone]. Received conformation.  Called Nonna and told her to call the business office to activate her account at [phone] per [facility].

## 2020-05-29 NOTE — Assessment & Plan Note (Signed)
This is multifactorial, likely anemia of chronic disease She is not symptomatic Observe for now 

## 2020-05-29 NOTE — Assessment & Plan Note (Signed)
She have severe epigastric pain/discomfort I suspect she might have peptic ulcer disease I recommend increasing Protonix to twice a day, to add over-the-counter Pepcid and Carafate I recommend referral to GI for endoscopy evaluation I will try to reach out to her previous gastroenterologist for an appointment We discussed dietary modification over the next few weeks to allow her stomach to heal

## 2020-05-29 NOTE — Progress Notes (Signed)
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[place]) Her recent blood work and CT imaging show no evidence of disease Her epigastric discomfort is unrelated to her previous cancer diagnosis I will call her with tumor marker results If her CA-125 is stable, I do not plan to repeat CT imaging until early next year I will see her back in December for further follow-up  Gastritis She have severe epigastric pain/discomfort I suspect she might have peptic ulcer disease I recommend increasing Protonix to twice a day, to add over-the-counter Pepcid and Carafate I recommend referral to GI for endoscopy evaluation I will try to reach out to her previous gastroenterologist for an appointment We discussed dietary modification over the next few weeks to allow her stomach to heal  Anemia, chronic disease This is multifactorial, likely anemia of chronic disease She is not symptomatic Observe for now  Drug-induced hyperglycemia Her blood sugar is elevated, at the prediabetic range She will continue Metformin   Orders Placed This Encounter  Procedures  . Hemoglobin A1c    Standing Status:   Future    Number of Occurrences:   1    Standing Expiration Date:   06/29/2020    All questions were answered. The patient knows to call the clinic with any problems, questions or concerns. The total time spent in the appointment was 30 minutes encounter with patients including review of chart and various tests results, discussions about plan of care and coordination of care plan   Heath Lark, MD 05/29/2020 1:52 PM  INTERVAL HISTORY: Please see below for problem oriented charting. She returns for further follow-up She missed her appointment recently She has not been feeling well since last time I saw her Over the last 2 days, she was not able to eat because of  epigastric pain.  Food tends to aggravate it. 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 There is a 16 cm complex cystic mass in the pelvis containing thickened septations located near midline, abutting the superior aspect of the uterus. I suspect this mass is adnexal/ovarian in origin rather than uterine in origin. Specifically, I suspect the mass likely arises from the left ovary/adnexa and is most consistent with a neoplasm. Malignancy is certainly not excluded on this study. Recommend a pelvic ultrasound for further evaluation. 2. The rounded more solid-appearing structure in the right side of the pelvis is probably the right ovary. Recommend attention on ultrasound. 3. The small amount of fluid in the pelvis is likely reactive to the complex cystic mass likely rising from the left ovary/adnexa. 4. No cause for blood in vomit or black stools identified. No convincing evidence of a perforated or inflamed gastric or duodenal ulcer. 5. Hepatic steatosis.   07/02/2019 Imaging   MRI pelvis 1. 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 There are 2 enhancing nodules within the  central upper abdomen raising the possibility of peritoneal carcinomatosis. Additionally, there is a small amount fluid within the pelvis. Malignant ascites not excluded. 2. Fibroid uterus. 3. Hepatic steatosis.   07/02/2019 Imaging   US pelvis 1. There is a large mass in the pelvis also seen on CT imaging. A separate  left ovary is not visualized. 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 The mass is suspicious for malignancy. Recommend gynecologic consultation. 2. The right ovary demonstrates an irregular ill-defined hypoechoic hypervascular region centrally. The findings are nonspecific in the right ovary. However, given the apparent left ovarian neoplasm suspicious for malignancy, recommend an MRI to further assess the right ovary. 3. Uterine fibroid measuring 3.6 cm.     07/21/2019 Pathology Results   FINAL MICROSCOPIC DIAGNOSIS: A. 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 - See oncology table. B. OMENTUM, RESECTION: - Omental lymph node with metastatic carcinoma (1/1). - Omental adipose tissue with foci of inflammation and reactive mesothelial changes. C. FALLOPIAN TUBE, LEFT, RESECTION: - Fallopian tube with focal, mild inflammation and fibrosis. - No tumor identified. D. UTERUS, CERVIX, RIGHT TUBE AND OVARY: - Cervix Nabothian cyst and squamous metaplasia. - Endometrium Proliferative. No hyperplasia or carcinoma. - Myometrium Leiomyomata. No malignancy. -Right ovary Poorly differentiated carcinoma, 4.8 cm. See oncology table and comment. -Right Fallopian tube Benign paratubal cyst. No endometriosis or malignancy. ONCOLOGY TABLE: OVARY or FALLOPIAN TUBE or PRIMARY PERITONEUM: Procedure: Bilateral f-oophorectomy, hysterectomy and omentectomy. Specimen Integrity: Intact Tumor Site: Left ovary and right ovary. See comment. Ovarian Surface Involvement: Not identified. Fallopian Tube Surface Involvement: Not identified. Tumor Size: Left ovary: 18.6 x 16.0 x 7.2 cm. Right ovary: 4.8 x 3.2 x 3.2 cm. Histologic Type: Clear cell carcinoma. See comment. Histologic Grade: High-grade. Other Tissue/ Organ Involvement: Omental lymph node with metastatic carcinoma. Peritoneal/Ascitic Fluid: Pending. Treatment Effect:  No known presurgical therapy. Pathologic Stage Classification (pTNM, AJCC 8th Edition): pT3a, pN1a. Representative Tumor Block: A2, A3, A4, A5 A6, A7 and A8. Comment(s): The left ovarian tumor is 18.6 cm in greatest dimension and has features of clear cell carcinoma. The right ovarian tumor is 4.8 cm in greatest dimension and is poorly differentiated with focal features consistent with clear cell carcinoma. The pattern of involvement suggests that the right ovarian tumor is likely metastatic from the larger left ovarian clear cell carcinoma. Sections of the omentum show a lymph node with metastatic carcinoma. Sections of the remainder of the omentum do not show involvement by carcinoma.   07/21/2019 Surgery   Procedure(s) Performed: Exploratory laparotomy with radical tumor debulking including total hysterectomy, bilateral salpingo-oophorectomy, infra-gastric omentectomy, and washings.   Surgeon: Jeral Pinch, MD    Operative Findings: On EUA, mobile uterus and ovarian mass, situated out of the pelvis, spanning 6-8 cm above the umbilicus.  On intra-abdominal entry, inflamed omentum adherent to much of the 16 cm left ovarian mass, adhesions easily lysed bluntly.  No nodularity of the ovarian mass however on delivery of the mass through the midline incision, there was Intra-Op rupture of one of the smaller cystic components with drainage of clear brown-tinged fluid.  Grossly normal-appearing left fallopian tube as well as right tube.  Right ovary mildly enlarged measuring approximately 4 cm and firm/nodular, suspicious for tumor involvement.  Uterus 8-10 cm with 4 cm anterior mid body fibroid.  Some very small volume miliary disease along right pelvic sidewall, removed with uterine specimen.  Evidence of small diverticular disease.  Small bowel normal appearing although multiple loops of bowel adherent to each other, especially by mesentery, and an inflammatory manner.  An approximately 2 x 2 centimeter  nodule suspicious for tumor implant was found in the omentum just inferior to the greater curvature of the stomach.  Additional small (less than 5 mm) implants noted on the stomach and posterior aspect of the left lobe of the liver.  Otherwise the liver surface and diaphragm were smooth.   08/03/2019 Cancer Staging   Staging form: Ovary, Fallopian Tube, and Primary Peritoneal Carcinoma, AJCC 8th Edition - Pathologic: Stage IIIA2 (pT3a, pN1, cM0) - Signed by Heath Lark, MD on 08/03/2019   08/12/2019 Procedure   Placement of single lumen port a cath via right internal jugular vein. The catheter tip lies at the cavo-atrial junction. A power injectable port a cath was placed and is ready for immediate use.   08/22/2019 -  Chemotherapy   The patient had carboplatin and taxol for chemotherapy treatment.     09/23/2019 Genetic Testing   Negative genetic testing:  No pathogenic variants detected on the [facility] TumorNext-HRD+CancerNext paired germline and somatic genetic test. The report date is 09/23/2019.  The TumorNext-HRD+CancerNext test offered by [facility] includes paired germline and tumor analyses of 11 genes associated with homologous recombination repair (ATM, BARD1, BRIP1, CHEK2, MRE11A, NBN, PALB2, RAD51C, RAD51D, BRCA1, BRCA2) plus germline analyses of 26 additional genes associated with hereditary cancer (APC, AXIN2, BMPR1A, CDH1, CDK4, CDKN2A, DICER1, HOXB13, EPCAM, GREM1, MLH1, MSH2, MSH3, MSH6, MUTYH, NF1, NTHL1, PMS2, POLD1, POLE, PTEN, RECQL, SMAD4, SMARCA4, STK11, and TP53).   11/14/2019 Tumor Marker   Patient's tumor was tested for the following markers: CA-125 Results of the tumor marker test revealed 10.2.   12/05/2019 Tumor Marker   Patient's tumor was tested for the following markers: CA-125 Results of the tumor marker test revealed 9.9   01/05/2020 Imaging   No specific findings of active malignancy. Interval resection of the pelvic mass. Subtle nodularity along the right side of the  vaginal cuff merits surveillance.     01/05/2020 Tumor Marker   Patient's tumor was tested for the following markers: CA-125 Results of the tumor marker test revealed 9.7   02/17/2020 Tumor Marker   Patient's tumor was tested for the following markers: CA-125 Results of the tumor marker test revealed 9.6   04/05/2020 Imaging   1. There is some minimal stranding along the mesentery and omentum without overt omental caking or well-defined nodularity. Faint bandlike density along the anterior peritoneal reflection in the pelvis, slightly more notable than on prior. Overall the appearance is not considered specific for recurrence but given the slight increase in prominence from 01/05/2020, would encourage careful surveillance by tumor markers and imaging. 2. Mild lower lumbar degenerative facet arthropathy.   04/05/2020 Tumor Marker   Patient's tumor was tested for the following markers: CA-125 Results of the tumor marker test revealed 11     REVIEW OF SYSTEMS:   Constitutional: Denies fevers, chills or abnormal weight loss Eyes: Denies blurriness of vision Ears, nose, mouth, throat, and face: Denies mucositis or sore throat Respiratory: Denies cough, dyspnea or wheezes Cardiovascular: Denies palpitation, chest discomfort or lower extremity swelling Skin: Denies abnormal skin rashes Lymphatics: Denies new lymphadenopathy or easy bruising Neurological:Denies numbness, tingling or new weaknesses Behavioral/Psych: Mood is stable, no new changes  All other systems were reviewed with the patient and are negative.  I have reviewed the past medical history, past surgical history, social history and family history  with the patient and they are unchanged from previous note.  ALLERGIES:  has No Known Allergies.  MEDICATIONS:  Current Outpatient Medications  Medication Sig Dispense Refill  . acetaminophen (TYLENOL) 500 MG tablet Take 1,000 mg by mouth every 6 (six) hours as needed for moderate  pain.     . famotidine (PEPCID) 20 MG tablet Take 1 tablet (20 mg total) by mouth 2 (two) times daily. 60 tablet 1  . lidocaine-prilocaine (EMLA) cream Apply to affected area once 30 g 3  . metFORMIN (GLUCOPHAGE) 500 MG tablet TAKE 1 TABLET BY MOUTH TWICE DAILY WITH MEALS 60 tablet 1  . Multiple Vitamin (MULTIVITAMIN WITH MINERALS) TABS tablet Take 1 tablet by mouth daily.    . pantoprazole (PROTONIX) 40 MG tablet Take 1 tablet (40 mg total) by mouth 2 (two) times daily. 60 tablet 11  . sucralfate (CARAFATE) 1 g tablet Take 1 tablet (1 g total) by mouth 3 (three) times daily. 90 tablet 1   No current facility-administered medications for this visit.    PHYSICAL EXAMINATION: ECOG PERFORMANCE STATUS: 2 - Symptomatic, <50% confined to bed  Vitals:   05/29/20 1151  BP: 139/84  Pulse: 81  Resp: 18  Temp: 97.8 F (36.6 C)  SpO2: 100%   Filed Weights   05/29/20 1151  Weight: 218 lb 12.8 oz (99.2 kg)    GENERAL:alert, no distress and comfortable SKIN: skin color, texture, turgor are normal, no rashes or significant lesions EYES: normal, Conjunctiva are pink and non-injected, sclera clear OROPHARYNX:no exudate, no erythema and lips, buccal mucosa, and tongue normal  NECK: supple, thyroid normal size, non-tender, without nodularity LYMPH:  no palpable lymphadenopathy in the cervical, axillary or inguinal LUNGS: clear to auscultation and percussion with normal breathing effort HEART: regular rate & rhythm and no murmurs and no lower extremity edema ABDOMEN:abdomen soft, significant epigastric discomfort on palpation without rebound or guarding Musculoskeletal:no cyanosis of digits and no clubbing  NEURO: alert & oriented x 3 with fluent speech, no focal motor/sensory deficits  LABORATORY DATA:  I have reviewed the data as listed    Component Value Date/Time   NA 141 05/29/2020 1129   K 3.7 05/29/2020 1129   CL 102 05/29/2020 1129   CO2 29 05/29/2020 1129   GLUCOSE 108 (H)  05/29/2020 1129   BUN 8 05/29/2020 1129   CREATININE 0.75 05/29/2020 1129   CALCIUM 9.7 05/29/2020 1129   PROT 7.1 05/29/2020 1129   ALBUMIN 3.9 05/29/2020 1129   AST 32 05/29/2020 1129   ALT 35 05/29/2020 1129   ALKPHOS 85 05/29/2020 1129   BILITOT 0.6 05/29/2020 1129   GFRNONAA >60 05/29/2020 1129   GFRAA >60 04/05/2020 1008    No results found for: SPEP, UPEP  Lab Results  Component Value Date   WBC 6.2 05/29/2020   NEUTROABS 4.1 05/29/2020   HGB 11.4 (L) 05/29/2020   HCT 35.4 (L) 05/29/2020   MCV 88.1 05/29/2020   PLT 270 05/29/2020      Chemistry      Component Value Date/Time   NA 141 05/29/2020 1129   K 3.7 05/29/2020 1129   CL 102 05/29/2020 1129   CO2 29 05/29/2020 1129   BUN 8 05/29/2020 1129   CREATININE 0.75 05/29/2020 1129      Component Value Date/Time   CALCIUM 9.7 05/29/2020 1129   ALKPHOS 85 05/29/2020 1129   AST 32 05/29/2020 1129   ALT 35 05/29/2020 1129   BILITOT 0.6 05/29/2020 1129

## 2020-05-29 NOTE — Patient Instructions (Signed)
1) Please increase protonix to twice daily 2) please get over the counter pepcid 20 mg daily 3) take carafate three times daily

## 2020-05-29 NOTE — Telephone Encounter (Signed)
OK I will refer her to someone else

## 2020-05-30 ENCOUNTER — Other Ambulatory Visit: Payer: Self-pay | Admitting: Hematology and Oncology

## 2020-05-30 ENCOUNTER — Telehealth: Payer: Self-pay

## 2020-05-30 DIAGNOSIS — K2900 Acute gastritis without bleeding: Secondary | ICD-10-CM

## 2020-05-30 LAB — CA 125: Cancer Antigen (CA) 125: 17 U/mL (ref 0.0–38.1)

## 2020-05-30 LAB — HEMOGLOBIN A1C
Hgb A1c MFr Bld: 6.5 % — ABNORMAL HIGH (ref 4.8–5.6)
Mean Plasma Glucose: 140 mg/dL

## 2020-05-30 NOTE — Telephone Encounter (Signed)
She called back. Reviewed new appt times for 12/1. She verbalzied understanding.  She ask if Dr. Alvy Bimler would make a referral to [facility]? [facility] was unable to make appt due to insurance issue and outstanding balance.

## 2020-05-30 NOTE — Telephone Encounter (Signed)
Called and given below message. She verbalized understanding. Appt for lab and flush moved to 12/1.

## 2020-05-30 NOTE — Telephone Encounter (Signed)
She just had CT done in September Her symptoms are not consistent with relapse We will start with GI eval first You can move her labs and flush to 12/1 and if CA-125 is high we can order Ct scan then

## 2020-05-30 NOTE — Telephone Encounter (Signed)
-----   Message from Heath Lark, MD sent at 05/30/2020  7:45 AM EDT ----- Regarding: labs Pls let her know CA-125 is stable Hemoglobin A1c a bit high

## 2020-05-30 NOTE — Telephone Encounter (Signed)
Called and given below message. She is watching her diet and eating low carb. She will start exercising this week.  She was concerned about her CA-125 going up to 17. She said when she was first diagnosed her CA-125 was in the 30's.

## 2020-05-30 NOTE — Telephone Encounter (Signed)
I sent the referral

## 2020-06-07 ENCOUNTER — Telehealth: Payer: Self-pay

## 2020-06-07 MED FILL — SUCRALFATE 1 GM TAB: 1 | 30 days supply | Qty: 90 | Fill #0

## 2020-06-07 NOTE — Telephone Encounter (Signed)
She called and left a message. She has not got a call from [facility] to schedule appt.  Called back and after call [facility]. Scheduled appt with Dr. Fuller Plan 11/16 at 1:30 pm. She is aware of appt/date at GI.

## 2020-06-19 ENCOUNTER — Other Ambulatory Visit: Payer: Self-pay | Admitting: Gastroenterology

## 2020-06-19 ENCOUNTER — Ambulatory Visit (INDEPENDENT_AMBULATORY_CARE_PROVIDER_SITE_OTHER): Payer: 59 | Admitting: Gastroenterology

## 2020-06-19 ENCOUNTER — Encounter: Payer: Self-pay | Admitting: Gastroenterology

## 2020-06-19 VITALS — BP 120/70 | HR 90 | Ht 62.0 in | Wt 220.0 lb

## 2020-06-19 DIAGNOSIS — K921 Melena: Secondary | ICD-10-CM | POA: Diagnosis not present

## 2020-06-19 DIAGNOSIS — R152 Fecal urgency: Secondary | ICD-10-CM | POA: Diagnosis not present

## 2020-06-19 DIAGNOSIS — R1013 Epigastric pain: Secondary | ICD-10-CM | POA: Diagnosis not present

## 2020-06-19 DIAGNOSIS — Z800 Family history of malignant neoplasm of digestive organs: Secondary | ICD-10-CM

## 2020-06-19 MED ORDER — DICYCLOMINE HCL 10 MG PO CAPS: 10.0000 mg | ORAL_CAPSULE | Freq: Three times a day (TID) | ORAL | 11 refills | Status: DC

## 2020-06-19 MED FILL — DICYCLOMINE 10 MG CAPSULE: 10 | 30 days supply | Qty: 90 | Fill #0

## 2020-06-19 NOTE — Patient Instructions (Signed)
We have sent the following medications to your pharmacy for you to pick up at your convenience: dicyclomine.   Discuss stopping your metformin with you prescribing doctor to see if symptoms improve.   Thank you for choosing me and [facility].  Pricilla Riffle. Dagoberto Ligas., MD., [facility] Regal

## 2020-06-19 NOTE — Progress Notes (Addendum)
History of Present Illness: This is a 44 year old female referred by Heath Lark, MD for the evaluation of epigastric pain. Sarah Newman has been treated for stage III left ovarian cancer treated with surgery and carboplatin and taxol. Negative genetic testing. Her father and a paternal aunt had colon cancer. Sarah Newman related crampy epigastric pain, bloating, loose urgent stools post meals and occasional BRBPR with bowel movements. Symptoms often follow meals. Loose stools have increased since beginning Metformin. Sarah Newman had EGD and colonoscopy at [facility] in 2018 and Sarah Newman does not recall the physician. Sarah Newman relates an ulcer with H pylori diagnosed in 2018.  Sarah Newman relates her colonoscopy was normal. Sarah Newman had difficulty getting an appt with [facility] so Sarah Newman decided to make an appt with [place]. Unfortunately we do not have any [facility] records. Denies weight loss, constipation, change in stool caliber, melena, hematochezia, nausea, vomiting, dysphagia, reflux symptoms, chest pain.   CT AP IMPRESSION 04/05/2020: 1. There is some minimal stranding along the mesentery and omentum without overt omental caking or well-defined nodularity. Faint bandlike density along the anterior peritoneal reflection in the pelvis, slightly more notable than on prior. Overall the appearance is not considered specific for recurrence but given the slight increase in prominence from 01/05/2020, would encourage careful surveillance by tumor markers and imaging. 2. Mild lower lumbar degenerative facet arthropathy.    No Known Allergies Outpatient Medications Prior to Visit  Medication Sig Dispense Refill   acetaminophen (TYLENOL) 500 MG tablet Take 1,000 mg by mouth every 6 (six) hours as needed for moderate pain.      lidocaine-prilocaine (EMLA) cream Apply to affected area once 30 g 3   metFORMIN (GLUCOPHAGE) 500 MG tablet TAKE 1 TABLET BY MOUTH TWICE DAILY WITH MEALS 60 tablet 1   Multiple Vitamin (MULTIVITAMIN WITH MINERALS) TABS tablet Take 1  tablet by mouth daily.     famotidine (PEPCID) 20 MG tablet Take 1 tablet (20 mg total) by mouth 2 (two) times daily. (Patient not taking: Reported on 06/19/2020) 60 tablet 1   pantoprazole (PROTONIX) 40 MG tablet Take 1 tablet (40 mg total) by mouth 2 (two) times daily. (Patient not taking: Reported on 06/19/2020) 60 tablet 11   sucralfate (CARAFATE) 1 g tablet Take 1 tablet (1 g total) by mouth 3 (three) times daily. (Patient not taking: Reported on 06/19/2020) 90 tablet 1   No facility-administered medications prior to visit.   Past Medical History:  Diagnosis Date   Anemia    Asthma    Family history of breast cancer    Family history of colon cancer    Family history of ovarian cancer    Family history of prostate cancer    Pelvic mass    Ulcer, stomach peptic    Past Surgical History:  Procedure Laterality Date   CHOLECYSTECTOMY N/A 02/16/2015   Procedure: LAPAROSCOPIC CHOLECYSTECTOMY WITH INTRAOPERATIVE CHOLANGIOGRAM;  Surgeon: Johnathan Hausen, MD;  Location: [facility];  Service: General;  Laterality: N/A;   IR IMAGING GUIDED PORT INSERTION  08/12/2019   SALPINGOOPHORECTOMY N/A 07/21/2019   Procedure: EXPLORATORY LAPAROTOMY, TOTAL ABDOMINAL HYSTERECTOMY, BILATERAL SALPINGO OOPHORECTOMY, OMENTECTOMY;  Surgeon: Lafonda Mosses, MD;  Location: [facility];  Service: Gynecology;  Laterality: N/A;   UPPER GASTROINTESTINAL ENDOSCOPY     Social History   Socioeconomic History   Marital status: Married    Spouse name: Herbie Baltimore   Number of children: 2   Years of education: Not on file   Highest education level: Not  on file  Occupational History   Not on file  Tobacco Use   Smoking status: Never Smoker   Smokeless tobacco: Never Used  Vaping Use   Vaping Use: Never used  Substance and Sexual Activity   Alcohol use: Yes    Comment: Social drinker   Drug use: No   Sexual activity: Not Currently  Other Topics Concern   Not on file  Social History Narrative     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  Newly married as of October 2020.   2 boys, age 30 and 77   Social Determinants of Health   Emergency planning/management officer Strain:    Difficulty of Paying Living Expenses: Not on file  Food Insecurity:    Worried About Charity fundraiser in the Last Year: Not on file   [employer] of Food in the Last Year: Not on file  Transportation Needs:    Lack of Transportation (Medical): Not on file   Lack of Transportation (Non-Medical): Not on file  Physical Activity:    Days of Exercise per Week: Not on file   Minutes of Exercise per Session: Not on file  Stress:    Feeling of Stress : Not on file  Social Connections:    Frequency of Communication with Friends and Family: Not on file   Frequency of Social Gatherings with Friends and Family: Not on file   Attends Religious Services: Not on file   Active Member of Clubs or Organizations: Not on file   Attends Archivist Meetings: Not on file   Marital Status: Not on file   Family History  Problem Relation Age of Onset   Colon cancer Father 35   Prostate cancer Father 44   Colon cancer Paternal Aunt        dx. early 31s   Ovarian cancer Paternal Grandmother 71   Breast cancer Cousin    Endometriosis Mother    Cancer Paternal Uncle        unknown type, dx. early 1s   Cancer Paternal Uncle 82       unknown type   Esophageal cancer Neg Hx       Review of Systems: Pertinent positive and negative review of systems were noted in the above HPI section. All other review of systems were otherwise negative.   Physical Exam: General: Well developed, well nourished, no acute distress Head: Normocephalic and atraumatic Eyes:  sclerae anicteric, EOMI Ears: Normal auditory acuity Mouth: Not examined, mask on during Covid-19 pandemic Neck: Supple, no masses or thyromegaly Lungs: Clear throughout to auscultation Heart: Regular rate and rhythm; no murmurs, rubs or  bruits Abdomen: Soft, non tender and non distended. No masses, hepatosplenomegaly or hernias noted. Normal Bowel sounds Rectal: No lesions, no tenderness, trace heme + stool in vault Musculoskeletal: Symmetrical with no gross deformities  Skin: No lesions on visible extremities Pulses:  Normal pulses noted Extremities: No clubbing, cyanosis, edema or deformities noted Neurological: Alert oriented x 4, grossly nonfocal Cervical Nodes:  No significant cervical adenopathy Inguinal Nodes: No significant inguinal adenopathy Psychological:  Alert and cooperative. Normal mood and affect   Assessment and Recommendations:  1. Epigastric pain, bloating, urgent post prandial loose stools, occasional small volume hematochezia with bowel movements. Trace heme + stool today. History of ulcer and H pylori. Metformin has made her loose stools worse.  Patient is advised to discuss discontinuing Metformin with prescribing provider. Recent CT AP without GI findings. Suspect IBS and  hemorrhoidal bleeding. R/O GERD, gastritis, ulcer, colitis. Continue pantoprazole 40 mg p.o. twice daily. Continue sucralfate 1 g 3 times daily. Add dicyclomine 10 mg po tid ac. Avoid foods and beverages that exacerbate symptoms. Request and when received review records form [facility]. Consider repeat EGD and consider repeat colonoscopy. REV in 1 month.   2. Anemia of chronic disease, stable. Hgb stable around 11.   3. Left ovarian cancer S/P total hysterectomy, BSO, omentectomy and chemotherapy. Recent CA 125=17. Recent CT AP as above. Follow up with Dr. Heath Lark.  4. Family history of colon cancer, father and paternal aunt. Request and when received review records from [facility]. 5-year interval colonoscopies.   07/04/2020 Addendum Colonoscopy 08/01/2016 external hemorrhoids, otherwise normal EGD 08/01/2016 small hiatal hernia, gastritis. Path; H pylori treated with Prevpac. Set recall colonoscopy for 07/2021 for family history  of colon cancer.    cc: Heath Lark, MD [address] [place],  [place] [id number]

## 2020-06-29 ENCOUNTER — Telehealth: Payer: Self-pay | Admitting: *Deleted

## 2020-06-29 MED FILL — SUCRALFATE 1 GM TAB: 1 | 30 days supply | Qty: 90 | Fill #0

## 2020-06-29 MED FILL — PANTOPRAZOLE SOD DR 40 MG T: 40 | 30 days supply | Qty: 60 | Fill #0

## 2020-06-29 MED FILL — DICYCLOMINE 10 MG CAPSULE: 10 | 30 days supply | Qty: 90 | Fill #0

## 2020-06-29 NOTE — Telephone Encounter (Signed)
Received vm call from pt stating that she is having stomach pain & request call back.  Returned call & she reports pain in abd below breast & states it really hurts.  She ate some macaroni & cheese & states that dairy products bother her but she didn't think that it would bother her. She has not taken asa or NSAIDS. Encouraged to call her GI to discuss since she has recently seen.  He added dicyclomine.  Will also discuss with Dr Alvy Bimler for suggestions.

## 2020-06-29 NOTE — Telephone Encounter (Signed)
Called pt back per Dr Alvy Bimler suggestion to make sure she was taking all three meds that he recommended.  She states that carafate wasn't at drug store when she picked up meds.  Encouraged to p/u carafate & take as prescribed.  She states that she had planned to go to ED.  She asked for something for pain, oxycodone.  Suggested trying Carafate first & if pain isn't better to call GI to schedule EGD.  Message to Dr Alvy Bimler.

## 2020-06-29 NOTE — Telephone Encounter (Signed)
Is she taking all the medications that the GI doctor recommended? I agree she should call them back and ask if the EGD can be scheduled ASAP

## 2020-07-02 ENCOUNTER — Telehealth: Payer: Self-pay

## 2020-07-02 MED FILL — METFORMIN HCL 500 MG TABS: 500 | 30 days supply | Qty: 60 | Fill #1

## 2020-07-02 NOTE — Telephone Encounter (Signed)
-----   Message from Heath Lark, MD sent at 07/02/2020  7:54 AM EST ----- Regarding: GI issues Pls see documentation from [facility] I do not prescribe oxycodone for non cancer issues We can prescribe carafate if she does not have it It is important for her to get EGD scheduled to get to the bottom of her problem

## 2020-07-02 NOTE — Telephone Encounter (Signed)
Called and given below message. She verbalized understanding. She has already started the Carafate and feels better. GI was closed Friday. She will call them today and ask about getting EGD scheduled.

## 2020-07-04 ENCOUNTER — Other Ambulatory Visit: Payer: 59

## 2020-07-04 ENCOUNTER — Inpatient Hospital Stay: Payer: 59 | Attending: Gynecologic Oncology | Admitting: Hematology and Oncology

## 2020-07-04 ENCOUNTER — Other Ambulatory Visit: Payer: Self-pay

## 2020-07-04 ENCOUNTER — Inpatient Hospital Stay: Payer: 59

## 2020-07-04 ENCOUNTER — Telehealth: Payer: Self-pay

## 2020-07-04 VITALS — BP 115/81 | HR 75 | Temp 97.4°F | Resp 18 | Ht 62.0 in | Wt 220.4 lb

## 2020-07-04 DIAGNOSIS — E8941 Symptomatic postprocedural ovarian failure: Secondary | ICD-10-CM | POA: Diagnosis not present

## 2020-07-04 DIAGNOSIS — K2970 Gastritis, unspecified, without bleeding: Secondary | ICD-10-CM | POA: Diagnosis not present

## 2020-07-04 DIAGNOSIS — R232 Flushing: Secondary | ICD-10-CM | POA: Insufficient documentation

## 2020-07-04 DIAGNOSIS — C562 Malignant neoplasm of left ovary: Secondary | ICD-10-CM

## 2020-07-04 DIAGNOSIS — Z79899 Other long term (current) drug therapy: Secondary | ICD-10-CM | POA: Insufficient documentation

## 2020-07-04 DIAGNOSIS — Z9049 Acquired absence of other specified parts of digestive tract: Secondary | ICD-10-CM | POA: Insufficient documentation

## 2020-07-04 DIAGNOSIS — Z23 Encounter for immunization: Secondary | ICD-10-CM | POA: Diagnosis not present

## 2020-07-04 DIAGNOSIS — D638 Anemia in other chronic diseases classified elsewhere: Secondary | ICD-10-CM

## 2020-07-04 DIAGNOSIS — R1900 Intra-abdominal and pelvic swelling, mass and lump, unspecified site: Secondary | ICD-10-CM

## 2020-07-04 LAB — CMP (CANCER CENTER ONLY)
ALT: 32 U/L (ref 0–44)
AST: 25 U/L (ref 15–41)
Albumin: 3.5 g/dL (ref 3.5–5.0)
Alkaline Phosphatase: 90 U/L (ref 38–126)
Anion gap: 8 (ref 5–15)
BUN: 11 mg/dL (ref 6–20)
CO2: 26 mmol/L (ref 22–32)
Calcium: 9.3 mg/dL (ref 8.9–10.3)
Chloride: 107 mmol/L (ref 98–111)
Creatinine: 0.69 mg/dL (ref 0.44–1.00)
GFR, Estimated: >60 mL/min (ref >60)
Glucose, Bld: 115 mg/dL — ABNORMAL HIGH (ref 70–99)
Potassium: 3.5 mmol/L (ref 3.5–5.1)
Sodium: 141 mmol/L (ref 135–145)
Total Bilirubin: <0.2 mg/dL — ABNORMAL LOW (ref 0.3–1.2)
Total Protein: 6.9 g/dL (ref 6.5–8.1)

## 2020-07-04 LAB — CBC WITH DIFFERENTIAL (CANCER CENTER ONLY)
Abs Immature Granulocytes: 0.02 10*3/uL (ref 0.00–0.07)
Basophils Absolute: 0 10*3/uL (ref 0.0–0.1)
Basophils Relative: 0 %
Eosinophils Absolute: 0.1 10*3/uL (ref 0.0–0.5)
Eosinophils Relative: 2 %
HCT: 31.9 % — ABNORMAL LOW (ref 36.0–46.0)
Hemoglobin: 10.3 g/dL — ABNORMAL LOW (ref 12.0–15.0)
Immature Granulocytes: 0 %
Lymphocytes Relative: 31 %
Lymphs Abs: 1.8 10*3/uL (ref 0.7–4.0)
MCH: 28.7 pg (ref 26.0–34.0)
MCHC: 32.3 g/dL (ref 30.0–36.0)
MCV: 88.9 fL (ref 80.0–100.0)
Monocytes Absolute: 0.3 10*3/uL (ref 0.1–1.0)
Monocytes Relative: 6 %
Neutro Abs: 3.6 10*3/uL (ref 1.7–7.7)
Neutrophils Relative %: 61 %
Platelet Count: 287 10*3/uL (ref 150–400)
RBC: 3.59 MIL/uL — ABNORMAL LOW (ref 3.87–5.11)
RDW: 12.5 % (ref 11.5–15.5)
WBC Count: 5.9 10*3/uL (ref 4.0–10.5)
nRBC: 0 % (ref 0.0–0.2)

## 2020-07-04 MED ORDER — INFLUENZA VAC SPLIT QUAD 0.5 ML IM SUSY
0.5000 mL | PREFILLED_SYRINGE | Freq: Once | INTRAMUSCULAR | Status: AC
  Administered 2020-07-04: 0.5 mL via INTRAMUSCULAR

## 2020-07-04 MED ORDER — INFLUENZA VAC SPLIT QUAD 0.5 ML IM SUSY
PREFILLED_SYRINGE | INTRAMUSCULAR | Status: AC
  Filled 2020-07-04: qty 0.5

## 2020-07-04 MED ORDER — HEPARIN SOD (PORK) LOCK FLUSH 100 UNIT/ML IV SOLN
500.0000 [IU] | Freq: Once | INTRAVENOUS | Status: AC
  Administered 2020-07-04: 500 [IU]
  Filled 2020-07-04: qty 5

## 2020-07-04 MED ORDER — SODIUM CHLORIDE 0.9% FLUSH
10.0000 mL | Freq: Once | INTRAVENOUS | Status: AC
  Administered 2020-07-04: 10 mL
  Filled 2020-07-04: qty 10

## 2020-07-04 NOTE — Telephone Encounter (Signed)
Called patient with no answer and voicemail box is full.

## 2020-07-04 NOTE — Telephone Encounter (Signed)
-----   Message from Ladene Artist, MD sent at 07/04/2020 12:54 PM EST ----- Addendum below added to 06/2020 office note. Please notify patient we received and reveiwed her [facility] records. Please enter recall colonoscopy for 07/2021 for FHx colon cancer.  If her current symptoms are not improved at 12/16 REV with PG colonoscopy and EGD might be scheduled at that time for symptoms.   07/04/2020 Addendum Colonoscopy 08/01/2016 external hemorrhoids, otherwise normal EGD 08/01/2016 small hiatal hernia, gastritis. Path; H pylori treated with Prevpac. Recall colonoscopy for 07/2021 for family history of colon cancer.

## 2020-07-05 ENCOUNTER — Other Ambulatory Visit: Payer: Self-pay | Admitting: Gynecologic Oncology

## 2020-07-05 ENCOUNTER — Telehealth: Payer: Self-pay | Admitting: Oncology

## 2020-07-05 ENCOUNTER — Telehealth: Payer: Self-pay | Admitting: Gastroenterology

## 2020-07-05 ENCOUNTER — Encounter: Payer: Self-pay | Admitting: Hematology and Oncology

## 2020-07-05 DIAGNOSIS — E8940 Asymptomatic postprocedural ovarian failure: Secondary | ICD-10-CM

## 2020-07-05 LAB — CA 125: Cancer Antigen (CA) 125: 20.1 U/mL (ref 0.0–38.1)

## 2020-07-05 MED ORDER — ESTRADIOL 0.05 MG/24HR TD PTWK: 0.0500 mg | MEDICATED_PATCH | TRANSDERMAL | 6 refills | Status: DC

## 2020-07-05 NOTE — Assessment & Plan Note (Signed)
She desire hormone replacement therapy again I will get assistant from GYN clinic to prescribe this

## 2020-07-05 NOTE — Assessment & Plan Note (Signed)
This is multifactorial, likely anemia of chronic disease She is not symptomatic Observe for now 

## 2020-07-05 NOTE — Telephone Encounter (Signed)
Pt is requesting a call back from a nurse to discuss possibly being scheduled for an EGD directly, pt last saw Dr Fuller Plan 06/19/2020.

## 2020-07-05 NOTE — Telephone Encounter (Signed)
Called Sarah Newman and advised her of the CA 125 results from yesterday.  Discussed that Dr. Alvy Bimler would like her to have a CT scan which has been scheduled for 07/16/20 at [facility] at 1:30 (arrive at 1:15, NPO 4 hours prior, drink contrast at 11:30 and 12:30).  Also scheduled appointment to see Dr. Alvy Bimler on 07/17/20 at 8 am.  She verbalized understanding and agreement of results and appointments.

## 2020-07-05 NOTE — Telephone Encounter (Signed)
Patient notified that Dr. Fuller Plan did want her to come in to be assessed again. Patient is having a CT scan on 12/13 due to elevated CA125.  She would like to move forward with diagnostic testing  I rescheduled her appt to tomorrow with Carl Best, RNP to discuss.

## 2020-07-05 NOTE — Assessment & Plan Note (Signed)
She continues to have nonspecific abdominal pain Tumor marker is slowly trending up even though still within normal limits I plan to repeat CT imaging and see her back in about 10 days for further assessment

## 2020-07-05 NOTE — Progress Notes (Signed)
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 CT ABDOMEN PELVIS W CONTRAST    Standing Status:   Future    Standing Expiration Date:   07/05/2021    Order Specific Question:   If indicated for the ordered procedure, I authorize the administration of contrast media per Radiology protocol    Answer:   Yes    Order Specific Question:   Preferred imaging location?    Answer:   [facility]    Order Specific Question:   Radiology Contrast Protocol - do NOT remove file path    Answer:   [email or web address]    Order Specific Question:   Is patient pregnant?    Answer:   No    All questions were answered. The patient knows to call the clinic with any problems, questions or concerns. The total time spent in the appointment was 20 minutes encounter with patients including review of chart and various tests results, discussions about plan of care and coordination of care plan   Heath Lark, MD 07/05/2020 10:19 AM  INTERVAL HISTORY: Please see below for problem oriented charting. 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 There is a 16 cm complex cystic mass in the pelvis containing thickened septations located near midline, abutting the superior aspect of the uterus. I suspect this mass is adnexal/ovarian in origin rather than uterine in origin. Specifically, I suspect the mass likely arises from the left ovary/adnexa and is most consistent with a neoplasm. Malignancy is certainly not excluded on this study. Recommend a pelvic ultrasound for further evaluation. 2. The rounded more solid-appearing structure in the right side of the pelvis is probably the right ovary. Recommend attention on ultrasound. 3. The small amount of fluid in the pelvis is likely reactive to the complex cystic mass likely rising from the left ovary/adnexa. 4. No cause for blood in vomit or black stools identified. No convincing evidence of a perforated or inflamed gastric or duodenal ulcer. 5. Hepatic steatosis.   07/02/2019 Imaging   MRI pelvis 1. 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 There are 2 enhancing nodules within the central upper abdomen raising the possibility of peritoneal carcinomatosis. Additionally, there is a small amount fluid within the pelvis. Malignant ascites not excluded. 2.  Fibroid uterus. 3. Hepatic steatosis.   07/02/2019 Imaging   US pelvis 1. There is a large mass in the pelvis also seen on CT imaging. A separate left ovary is not visualized. 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 The mass is suspicious for malignancy. Recommend gynecologic consultation. 2.  The right ovary demonstrates an irregular ill-defined hypoechoic hypervascular region centrally. The findings are nonspecific in the right ovary. However, given the apparent left ovarian neoplasm suspicious for malignancy, recommend an MRI to further assess the right ovary. 3. Uterine fibroid measuring 3.6 cm.     07/21/2019 Pathology Results   FINAL MICROSCOPIC DIAGNOSIS: A. 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 - See oncology table. B. OMENTUM, RESECTION: - Omental lymph node with metastatic carcinoma (1/1). - Omental adipose tissue with foci of inflammation and reactive mesothelial changes. C. FALLOPIAN TUBE, LEFT, RESECTION: - Fallopian tube with focal, mild inflammation and fibrosis. - No tumor identified. D. UTERUS, CERVIX, RIGHT TUBE AND OVARY: - Cervix Nabothian cyst and squamous metaplasia. - Endometrium Proliferative. No hyperplasia or carcinoma. - Myometrium Leiomyomata. No malignancy. -Right ovary Poorly differentiated carcinoma, 4.8 cm. See oncology table and comment. -Right Fallopian tube Benign paratubal cyst. No endometriosis or malignancy. ONCOLOGY TABLE: OVARY or FALLOPIAN TUBE or PRIMARY PERITONEUM: Procedure: Bilateral f-oophorectomy, hysterectomy and omentectomy. Specimen Integrity: Intact Tumor Site: Left ovary and right ovary. See comment. Ovarian Surface Involvement: Not identified. Fallopian Tube Surface Involvement: Not identified. Tumor Size: Left ovary: 18.6 x 16.0 x 7.2 cm. Right ovary: 4.8 x 3.2 x 3.2 cm. Histologic Type: Clear cell carcinoma. See comment. Histologic Grade: High-grade. Other Tissue/ Organ Involvement: Omental lymph node with metastatic carcinoma. Peritoneal/Ascitic Fluid: Pending. Treatment Effect: No known presurgical therapy. Pathologic Stage Classification (pTNM, AJCC 8th Edition): pT3a, pN1a. Representative Tumor Block: A2, A3, A4, A5 A6, A7 and A8. Comment(s): The left ovarian  tumor is 18.6 cm in greatest dimension and has features of clear cell carcinoma. The right ovarian tumor is 4.8 cm in greatest dimension and is poorly differentiated with focal features consistent with clear cell carcinoma. The pattern of involvement suggests that the right ovarian tumor is likely metastatic from the larger left ovarian clear cell carcinoma. Sections of the omentum show a lymph node with metastatic carcinoma. Sections of the remainder of the omentum do not show involvement by carcinoma.   07/21/2019 Surgery   Procedure(s) Performed: Exploratory laparotomy with radical tumor debulking including total hysterectomy, bilateral salpingo-oophorectomy, infra-gastric omentectomy, and washings.   Surgeon: Jeral Pinch, MD    Operative Findings: On EUA, mobile uterus and ovarian mass, situated out of the pelvis, spanning 6-8 cm above the umbilicus.  On intra-abdominal entry, inflamed omentum adherent to much of the 16 cm left ovarian mass, adhesions easily lysed bluntly.  No nodularity of the ovarian mass however on delivery of the mass through the midline incision, there was Intra-Op rupture of one of the smaller cystic components with drainage of clear brown-tinged fluid.  Grossly normal-appearing left fallopian tube as well as right tube.  Right ovary mildly enlarged measuring approximately 4 cm and firm/nodular, suspicious for tumor involvement.  Uterus 8-10 cm with 4 cm anterior mid body fibroid.  Some very small volume miliary disease along right pelvic sidewall, removed with uterine specimen.  Evidence of small diverticular disease.  Small bowel normal appearing although multiple loops of bowel adherent to each other, especially by mesentery, and an inflammatory manner.  An approximately 2  x 2 centimeter nodule suspicious for tumor implant was found in the omentum just inferior to the greater curvature of the stomach.  Additional small (less than 5 mm) implants noted on the stomach and  posterior aspect of the left lobe of the liver.  Otherwise the liver surface and diaphragm were smooth.   08/03/2019 Cancer Staging   Staging form: Ovary, Fallopian Tube, and Primary Peritoneal Carcinoma, AJCC 8th Edition - Pathologic: Stage IIIA2 (pT3a, pN1, cM0) - Signed by Heath Lark, MD on 08/03/2019   08/12/2019 Procedure   Placement of single lumen port a cath via right internal jugular vein. The catheter tip lies at the cavo-atrial junction. A power injectable port a cath was placed and is ready for immediate use.   08/22/2019 -  Chemotherapy   The patient had carboplatin and taxol for chemotherapy treatment.     09/23/2019 Genetic Testing   Negative genetic testing:  No pathogenic variants detected on the [facility] TumorNext-HRD+CancerNext paired germline and somatic genetic test. The report date is 09/23/2019.  The TumorNext-HRD+CancerNext test offered by [facility] includes paired germline and tumor analyses of 11 genes associated with homologous recombination repair (ATM, BARD1, BRIP1, CHEK2, MRE11A, NBN, PALB2, RAD51C, RAD51D, BRCA1, BRCA2) plus germline analyses of 26 additional genes associated with hereditary cancer (APC, AXIN2, BMPR1A, CDH1, CDK4, CDKN2A, DICER1, HOXB13, EPCAM, GREM1, MLH1, MSH2, MSH3, MSH6, MUTYH, NF1, NTHL1, PMS2, POLD1, POLE, PTEN, RECQL, SMAD4, SMARCA4, STK11, and TP53).   11/14/2019 Tumor Marker   Patient's tumor was tested for the following markers: CA-125 Results of the tumor marker test revealed 10.2.   12/05/2019 Tumor Marker   Patient's tumor was tested for the following markers: CA-125 Results of the tumor marker test revealed 9.9   01/05/2020 Imaging   No specific findings of active malignancy. Interval resection of the pelvic mass. Subtle nodularity along the right side of the vaginal cuff merits surveillance.     01/05/2020 Tumor Marker   Patient's tumor was tested for the following markers: CA-125 Results of the tumor marker test revealed 9.7   02/17/2020  Tumor Marker   Patient's tumor was tested for the following markers: CA-125 Results of the tumor marker test revealed 9.6   04/05/2020 Imaging   1. There is some minimal stranding along the mesentery and omentum without overt omental caking or well-defined nodularity. Faint bandlike density along the anterior peritoneal reflection in the pelvis, slightly more notable than on prior. Overall the appearance is not considered specific for recurrence but given the slight increase in prominence from 01/05/2020, would encourage careful surveillance by tumor markers and imaging. 2. Mild lower lumbar degenerative facet arthropathy.   04/05/2020 Tumor Marker   Patient's tumor was tested for the following markers: CA-125 Results of the tumor marker test revealed 11   05/29/2020 Tumor Marker   Patient's tumor was tested for the following markers: CA-125. Results of the tumor marker test revealed 17.   07/04/2020 Tumor Marker   Patient's tumor was tested for the following markers: CA-125. Results of the tumor marker test revealed 20.1     REVIEW OF SYSTEMS:   Constitutional: Denies fevers, chills or abnormal weight loss Eyes: Denies blurriness of vision Ears, nose, mouth, throat, and face: Denies mucositis or sore throat Respiratory: Denies cough, dyspnea or wheezes Cardiovascular: Denies palpitation, chest discomfort or lower extremity swelling Skin: Denies abnormal skin rashes Lymphatics: Denies new lymphadenopathy or easy bruising Neurological:Denies numbness, tingling or new weaknesses Behavioral/Psych: Mood is stable, no new changes  All other systems  were reviewed with the patient and are negative.  I have reviewed the past medical history, past surgical history, social history and family history with the patient and they are unchanged from previous note.  ALLERGIES:  has No Known Allergies.  MEDICATIONS:  Current Outpatient Medications  Medication Sig Dispense Refill  . acetaminophen  (TYLENOL) 500 MG tablet Take 1,000 mg by mouth every 6 (six) hours as needed for moderate pain.     Marland Kitchen dicyclomine (BENTYL) 10 MG capsule Take 1 capsule (10 mg total) by mouth 3 (three) times daily before meals. 90 capsule 11  . famotidine (PEPCID) 20 MG tablet Take 1 tablet (20 mg total) by mouth 2 (two) times daily. (Patient not taking: Reported on 06/19/2020) 60 tablet 1  . lidocaine-prilocaine (EMLA) cream Apply to affected area once 30 g 3  . metFORMIN (GLUCOPHAGE) 500 MG tablet TAKE 1 TABLET BY MOUTH TWICE DAILY WITH MEALS 60 tablet 1  . Multiple Vitamin (MULTIVITAMIN WITH MINERALS) TABS tablet Take 1 tablet by mouth daily.    . pantoprazole (PROTONIX) 40 MG tablet Take 1 tablet (40 mg total) by mouth 2 (two) times daily. (Patient not taking: Reported on 06/19/2020) 60 tablet 11  . sucralfate (CARAFATE) 1 g tablet Take 1 tablet (1 g total) by mouth 3 (three) times daily. (Patient not taking: Reported on 06/19/2020) 90 tablet 1   No current facility-administered medications for this visit.    PHYSICAL EXAMINATION: ECOG PERFORMANCE STATUS: 1 - Symptomatic but completely ambulatory  Vitals:   07/04/20 1040  BP: 115/81  Pulse: 75  Resp: 18  Temp: (!) 97.4 F (36.3 C)  SpO2: 95%   Filed Weights   07/04/20 1040  Weight: 220 lb 6.4 oz (100 kg)    GENERAL:alert, no distress and comfortable NEURO: alert & oriented x 3 with fluent speech, no focal motor/sensory deficits  LABORATORY DATA:  I have reviewed the data as listed    Component Value Date/Time   NA 141 07/04/2020 1003   K 3.5 07/04/2020 1003   CL 107 07/04/2020 1003   CO2 26 07/04/2020 1003   GLUCOSE 115 (H) 07/04/2020 1003   BUN 11 07/04/2020 1003   CREATININE 0.69 07/04/2020 1003   CALCIUM 9.3 07/04/2020 1003   PROT 6.9 07/04/2020 1003   ALBUMIN 3.5 07/04/2020 1003   AST 25 07/04/2020 1003   ALT 32 07/04/2020 1003   ALKPHOS 90 07/04/2020 1003   BILITOT <0.2 (L) 07/04/2020 1003   GFRNONAA >60 07/04/2020 1003    GFRAA >60 04/05/2020 1008    No results found for: SPEP, UPEP  Lab Results  Component Value Date   WBC 5.9 07/04/2020   NEUTROABS 3.6 07/04/2020   HGB 10.3 (L) 07/04/2020   HCT 31.9 (L) 07/04/2020   MCV 88.9 07/04/2020   PLT 287 07/04/2020      Chemistry      Component Value Date/Time   NA 141 07/04/2020 1003   K 3.5 07/04/2020 1003   CL 107 07/04/2020 1003   CO2 26 07/04/2020 1003   BUN 11 07/04/2020 1003   CREATININE 0.69 07/04/2020 1003      Component Value Date/Time   CALCIUM 9.3 07/04/2020 1003   ALKPHOS 90 07/04/2020 1003   AST 25 07/04/2020 1003   ALT 32 07/04/2020 1003   BILITOT <0.2 (L) 07/04/2020 1003

## 2020-07-06 ENCOUNTER — Ambulatory Visit: Payer: 59 | Admitting: Nurse Practitioner

## 2020-07-06 NOTE — Progress Notes (Deleted)
     07/06/2020 Sarah Newman [id number] June 02, 1976   Chief Complaint:  History of Present Illness: Sarah Newman is a 44 year old female with a past medical history significant for stage III left ovarian cancer treated with surgery and carboplatin and Taxol. Negative genetic testing. H. pylori gastritis treated with Prevpac 07/2016. Her father and paternal aunt had colon cancer.  She was seen in our office by Dr. Fuller Plan on 06/19/2020 for further evaluation for epigastric pain. That time, she was advised to continue pantoprazole 40 mg twice daily, sucralfate 1 g 3 times daily and dicyclomine 10 mg p.o. 3 times daily for meals was added.  Prior EGD and colonoscopy by [facility]. Colonoscopy 08/01/2016 external hemorrhoids, otherwise normal EGD 08/01/2016 small hiatal hernia, gastritis. Path; H pylori treated with Prevpac. Set recall colonoscopy for 07/2021 for family history of colon cancer.   CT AP IMPRESSION 04/05/2020: 1. There is some minimal stranding along the mesentery and omentum without overt omental caking or well-defined nodularity. Faint bandlike density along the anterior peritoneal reflection in the pelvis, slightly more notable than on prior. Overall the appearance is not considered specific for recurrence but given the slight increase in prominence from 01/05/2020, would encourage careful surveillance by tumor markers and imaging. 2. Mild lower lumbar degenerative facet arthropathy.    Current Medications, Allergies, Past Medical History, Past Surgical History, Family History and Social History were reviewed in [employer] record.   Review of Systems:   Constitutional: Negative for fever, sweats, chills or weight loss.  Respiratory: Negative for shortness of breath.   Cardiovascular: Negative for chest pain, palpitations and leg swelling.  Gastrointestinal: See HPI.  Musculoskeletal: Negative for back pain or muscle aches.  Neurological:  Negative for dizziness, headaches or paresthesias.    Physical Exam: LMP 07/05/2019  General: Well developed, w   ***female in no acute distress. Head: Normocephalic and atraumatic. Eyes: No scleral icterus. Conjunctiva pink . Ears: Normal auditory acuity. Mouth: Dentition intact. No ulcers or lesions.  Lungs: Clear throughout to auscultation. Heart: Regular rate and rhythm, no murmur. Abdomen: Soft, nontender and nondistended. No masses or hepatomegaly. Normal bowel sounds x 4 quadrants.  Rectal: *** Musculoskeletal: Symmetrical with no gross deformities. Extremities: No edema. Neurological: Alert oriented x 4. No focal deficits.  Psychological: Alert and cooperative. Normal mood and affect  Assessment and Recommendations:  1. Epigastric pain  2. Anemia of chronic disease  3. Left ovarian cancer S/P total hysterectomy, BSO, omentectomy and chemotherapy. Recent CA 125=17.   4. Family history of colon cancer, father and paternal aunt. -Colonoscopy recall 07/2021

## 2020-07-06 NOTE — Telephone Encounter (Signed)
Called patient with no answer and voicemail box is full.

## 2020-07-10 ENCOUNTER — Other Ambulatory Visit: Payer: Self-pay

## 2020-07-10 ENCOUNTER — Encounter (HOSPITAL_BASED_OUTPATIENT_CLINIC_OR_DEPARTMENT_OTHER): Payer: Self-pay | Admitting: *Deleted

## 2020-07-10 ENCOUNTER — Emergency Department (HOSPITAL_BASED_OUTPATIENT_CLINIC_OR_DEPARTMENT_OTHER)
Admission: EM | Admit: 2020-07-10 | Discharge: 2020-07-10 | Disposition: A | Discharge status: Home / Self Care | Payer: 59 | Attending: Emergency Medicine | Admitting: Emergency Medicine

## 2020-07-10 DIAGNOSIS — R1013 Epigastric pain: Secondary | ICD-10-CM | POA: Diagnosis not present

## 2020-07-10 DIAGNOSIS — E876 Hypokalemia: Secondary | ICD-10-CM | POA: Insufficient documentation

## 2020-07-10 DIAGNOSIS — R109 Unspecified abdominal pain: Secondary | ICD-10-CM | POA: Diagnosis present

## 2020-07-10 LAB — COMPREHENSIVE METABOLIC PANEL
ALT: 35 U/L (ref 0–44)
AST: 31 U/L (ref 15–41)
Albumin: 4.4 g/dL (ref 3.5–5.0)
Alkaline Phosphatase: 91 U/L (ref 38–126)
Anion gap: 13 (ref 5–15)
BUN: 11 mg/dL (ref 6–20)
CO2: 25 mmol/L (ref 22–32)
Calcium: 9.4 mg/dL (ref 8.9–10.3)
Chloride: 101 mmol/L (ref 98–111)
Creatinine, Ser: 0.85 mg/dL (ref 0.44–1.00)
GFR, Estimated: >60 mL/min (ref >60)
Glucose, Bld: 143 mg/dL — ABNORMAL HIGH (ref 70–99)
Potassium: 3.2 mmol/L — ABNORMAL LOW (ref 3.5–5.1)
Sodium: 139 mmol/L (ref 135–145)
Total Bilirubin: 0.6 mg/dL (ref 0.3–1.2)
Total Protein: 8.1 g/dL (ref 6.5–8.1)

## 2020-07-10 LAB — CBC WITH DIFFERENTIAL/PLATELET
Abs Immature Granulocytes: 0.02 10*3/uL (ref 0.00–0.07)
Basophils Absolute: 0 10*3/uL (ref 0.0–0.1)
Basophils Relative: 0 %
Eosinophils Absolute: 0.1 10*3/uL (ref 0.0–0.5)
Eosinophils Relative: 1 %
HCT: 39 % (ref 36.0–46.0)
Hemoglobin: 12.8 g/dL (ref 12.0–15.0)
Immature Granulocytes: 0 %
Lymphocytes Relative: 14 %
Lymphs Abs: 1.5 10*3/uL (ref 0.7–4.0)
MCH: 28.9 pg (ref 26.0–34.0)
MCHC: 32.8 g/dL (ref 30.0–36.0)
MCV: 88 fL (ref 80.0–100.0)
Monocytes Absolute: 0.4 10*3/uL (ref 0.1–1.0)
Monocytes Relative: 4 %
Neutro Abs: 8.8 10*3/uL — ABNORMAL HIGH (ref 1.7–7.7)
Neutrophils Relative %: 81 %
Platelets: 393 10*3/uL (ref 150–400)
RBC: 4.43 MIL/uL (ref 3.87–5.11)
RDW: 12.6 % (ref 11.5–15.5)
WBC: 10.9 10*3/uL — ABNORMAL HIGH (ref 4.0–10.5)
nRBC: 0 % (ref 0.0–0.2)

## 2020-07-10 LAB — LIPASE, BLOOD: Lipase: 28 U/L (ref 11–51)

## 2020-07-10 MED ORDER — PANTOPRAZOLE SODIUM 40 MG IV SOLR
40.0000 mg | Freq: Once | INTRAVENOUS | Status: AC
  Administered 2020-07-10: 40 mg via INTRAVENOUS
  Filled 2020-07-10: qty 40

## 2020-07-10 MED ORDER — FENTANYL CITRATE (PF) 100 MCG/2ML IJ SOLN
50.0000 ug | Freq: Once | INTRAMUSCULAR | Status: AC
  Administered 2020-07-10: 50 ug via INTRAVENOUS
  Filled 2020-07-10: qty 2

## 2020-07-10 MED ORDER — POTASSIUM CHLORIDE CRYS ER 20 MEQ PO TBCR
40.0000 meq | EXTENDED_RELEASE_TABLET | Freq: Once | ORAL | Status: AC
  Administered 2020-07-10: 40 meq via ORAL
  Filled 2020-07-10: qty 2

## 2020-07-10 MED ORDER — LIDOCAINE VISCOUS HCL 2 % MT SOLN
15.0000 mL | Freq: Once | OROMUCOSAL | Status: AC
  Administered 2020-07-10: 15 mL via ORAL
  Filled 2020-07-10: qty 15

## 2020-07-10 MED ORDER — SODIUM CHLORIDE 0.9 % IV BOLUS
1000.0000 mL | Freq: Once | INTRAVENOUS | Status: AC
  Administered 2020-07-10: 1000 mL via INTRAVENOUS

## 2020-07-10 MED ORDER — ALUM & MAG HYDROXIDE-SIMETH 200-200-20 MG/5ML PO SUSP
30.0000 mL | Freq: Once | ORAL | Status: AC
  Administered 2020-07-10: 30 mL via ORAL
  Filled 2020-07-10: qty 30

## 2020-07-10 MED ORDER — DICYCLOMINE HCL 10 MG PO CAPS
10.0000 mg | ORAL_CAPSULE | Freq: Once | ORAL | Status: AC
  Administered 2020-07-10: 10 mg via ORAL
  Filled 2020-07-10: qty 1

## 2020-07-10 MED ORDER — ONDANSETRON HCL 4 MG/2ML IJ SOLN
4.0000 mg | Freq: Once | INTRAMUSCULAR | Status: AC
  Administered 2020-07-10: 4 mg via INTRAVENOUS
  Filled 2020-07-10: qty 2

## 2020-07-10 NOTE — ED Notes (Signed)
Review D/C papers with pt, pt states understanding, pt denies questions at this time.

## 2020-07-10 NOTE — ED Triage Notes (Signed)
Abdominal pain, nausea & vomiting since midnight. Denies fever.

## 2020-07-10 NOTE — ED Provider Notes (Signed)
[facility] EMERGENCY DEPARTMENT Provider Note   CSN: [id number] Arrival date & time: 07/10/20  0805     History Chief Complaint  Patient presents with  . Abdominal Pain  . Emesis  . Nausea    Sarah Newman is a 44 y.o. female.  Patient is a 45 year old female with a history of prior stage III left ovarian cancer, hyperglycemia who presents with abdominal pain.  She has been having some recurrent episodes of epigastric discomfort associated with loose stools nausea and vomiting.  She has had some intermittent blood in her stools.  This has been going on for a while.  She was previously being seen at [facility] gastroenterology.  She recently switched her care to [facility] gastro[facility].  She was started on Carafate, and acid medication and Bentyl.  It was felt that she had a component of IBS with some intermittent blood in her stools from hemorrhoids.  She has been watching what she eats carefully.  She says that certain foods trigger her symptoms.  She said that when she was on her way to work last night she ate some hush puppies and dipped them in a butter sauce.  About midnight she had onset of epigastric pain associated with nausea and vomiting.  It is nonbloody and nonbilious emesis.  She has had some dry heaves since that time.  She has pain in her epigastrium and across her upper abdomen.  These are similar symptoms to what she has had in the past.  She does not currently have any change in her stools.  No fevers.  No urinary symptoms.        Past Medical History:  Diagnosis Date  . Anemia   . Asthma   . Family history of breast cancer   . Family history of colon cancer   . Family history of ovarian cancer   . Family history of prostate cancer   . Pelvic mass   . Ulcer, stomach peptic     Patient Active Problem List   Diagnosis Date Noted  . Anemia, chronic disease 05/29/2020  . UTI (urinary tract infection) 01/09/2020  . Dysuria 01/06/2020  . Anemia due  to antineoplastic chemotherapy 12/05/2019  . Gastritis 12/05/2019  . 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/2019  . Weight gain 10/25/2019  . Vaginal dryness 10/24/2019  . Drug-induced hyperglycemia 10/03/2019  . Genetic testing 09/23/2019  . Bone pain 09/13/2019  . Restless leg 09/13/2019  . Elevated liver enzymes 09/13/2019  . Family history of ovarian cancer   . Family history of prostate cancer   . Family history of colon cancer   . Family history of breast cancer   . 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HCC)   . Pelvic mass in female 07/11/2019  . Obesity 07/11/2019  . Cholecystitis 02/16/2015    Past Surgical History:  Procedure Laterality Date  . CHOLECYSTECTOMY N/A 02/16/2015   Procedure: LAPAROSCOPIC CHOLECYSTECTOMY WITH INTRAOPERATIVE CHOLANGIOGRAM;  Surgeon: Johnathan Hausen, MD;  Location: [facility] ORS;  Service: General;  Laterality: N/A;  . IR IMAGING GUIDED PORT INSERTION  08/12/2019  . SALPINGOOPHORECTOMY N/A 07/21/2019   Procedure: EXPLORATORY LAPAROTOMY, TOTAL ABDOMINAL HYSTERECTOMY, BILATERAL SALPINGO OOPHORECTOMY, OMENTECTOMY;  Surgeon: Lafonda Mosses, MD;  Location: [facility];  Service: Gynecology;  Laterality: N/A;  . UPPER GASTROINTESTINAL ENDOSCOPY       OB History    Gravida  3   Para  3   Term      Preterm  AB      Living  2     SAB      TAB      Ectopic      Multiple      Live Births  2           Family History  Problem Relation Age of Onset  . Colon cancer Father 33  . Prostate cancer Father 4  . Colon cancer Paternal Aunt        dx. early 72s  . Ovarian cancer Paternal Grandmother 103  . Breast cancer Cousin   . Endometriosis Mother   . Cancer Paternal Uncle        unknown type, dx. early 71s  . Cancer Paternal Uncle 13       unknown type  . Esophageal cancer Neg Hx     Social History   Tobacco Use  . Smoking status: Never Smoker  . Smokeless tobacco: Never Used  Vaping Use  . Vaping Use: Never used  Substance  Use Topics  . Alcohol use: Yes    Comment: Social drinker  . Drug use: No    Home Medications Prior to Admission medications   Medication Sig Start Date End Date Taking? Authorizing Provider  dicyclomine (BENTYL) 10 MG capsule Take 1 capsule (10 mg total) by mouth 3 (three) times daily before meals. 06/19/20  Yes Ladene Artist, MD  famotidine (PEPCID) 20 MG tablet Take 1 tablet (20 mg total) by mouth 2 (two) times daily. 11/25/19  Yes Nanavati, Ankit, MD  metFORMIN (GLUCOPHAGE) 500 MG tablet TAKE 1 TABLET BY MOUTH TWICE DAILY WITH MEALS 05/14/20  Yes Gorsuch, Ni, MD  Multiple Vitamin (MULTIVITAMIN WITH MINERALS) TABS tablet Take 1 tablet by mouth daily.   Yes [provider]  pantoprazole (PROTONIX) 40 MG tablet Take 1 tablet (40 mg total) by mouth 2 (two) times daily. 05/29/20  Yes Gorsuch, Ni, MD  sucralfate (CARAFATE) 1 g tablet Take 1 tablet (1 g total) by mouth 3 (three) times daily. 05/29/20  Yes Gorsuch, Ni, MD  acetaminophen (TYLENOL) 500 MG tablet Take 1,000 mg by mouth every 6 (six) hours as needed for moderate pain.     [provider]  estradiol (CLIMARA) 0.05 mg/24hr patch Place 1 patch (0.05 mg total) onto the skin once a week. 07/05/20   Joylene John D, NP  lidocaine-prilocaine (EMLA) cream Apply to affected area once 08/03/19   Heath Lark, MD    Allergies    Patient has no known allergies.  Review of Systems   Review of Systems  Constitutional: Negative for chills, diaphoresis, fatigue and fever.  HENT: Negative for congestion, rhinorrhea and sneezing.   Eyes: Negative.   Respiratory: Negative for cough, chest tightness and shortness of breath.   Cardiovascular: Negative for chest pain and leg swelling.  Gastrointestinal: Positive for abdominal pain, blood in stool (Intermittent, has been followed by gastroenterology), nausea and vomiting. Negative for diarrhea.  Genitourinary: Negative for difficulty urinating, flank pain, frequency and hematuria.   Musculoskeletal: Negative for arthralgias and back pain.  Skin: Negative for rash.  Neurological: Negative for dizziness, speech difficulty, weakness, numbness and headaches.    Physical Exam Updated Vital Signs BP 119/83 (BP Location: Right Arm)   Pulse 78   Temp 98.7 F (37.1 C) (Oral)   Resp 19   Ht 5\' 2"  (1.575 m)   Wt 99.8 kg   LMP 07/05/2019   SpO2 96%   BMI 40.24 kg/m   Physical  Exam Constitutional:      Appearance: She is well-developed.  HENT:     Head: Normocephalic and atraumatic.  Eyes:     Pupils: Pupils are equal, round, and reactive to light.  Cardiovascular:     Rate and Rhythm: Normal rate and regular rhythm.     Heart sounds: Normal heart sounds.  Pulmonary:     Effort: Pulmonary effort is normal. No respiratory distress.     Breath sounds: Normal breath sounds. No wheezing or rales.  Chest:     Chest wall: No tenderness.  Abdominal:     General: Bowel sounds are normal.     Palpations: Abdomen is soft.     Tenderness: There is abdominal tenderness in the epigastric area. There is no guarding or rebound.  Musculoskeletal:        General: Normal range of motion.     Cervical back: Normal range of motion and neck supple.  Lymphadenopathy:     Cervical: No cervical adenopathy.  Skin:    General: Skin is warm and dry.     Findings: No rash.  Neurological:     Mental Status: She is alert and oriented to person, place, and time.     ED Results / Procedures / Treatments   Labs (all labs ordered are listed, but only abnormal results are displayed) Labs Reviewed  COMPREHENSIVE METABOLIC PANEL - Abnormal; Notable for the following components:      Result Value   Potassium 3.2 (*)    Glucose, Bld 143 (*)    All other components within normal limits  CBC WITH DIFFERENTIAL/PLATELET - Abnormal; Notable for the following components:   WBC 10.9 (*)    Neutro Abs 8.8 (*)    All other components within normal limits  LIPASE, BLOOD     EKG None  Radiology No results found.  Procedures Procedures (including critical care time)  Medications Ordered in ED Medications  alum & mag hydroxide-simeth (MAALOX/MYLANTA) 200-200-20 MG/5ML suspension 30 mL (has no administration in time range)    And  lidocaine (XYLOCAINE) 2 % viscous mouth solution 15 mL (has no administration in time range)  ondansetron (ZOFRAN) injection 4 mg (4 mg Intravenous Given 07/10/20 0916)  sodium chloride 0.9 % bolus 1,000 mL (0 mLs Intravenous Stopped 07/10/20 1030)  pantoprazole (PROTONIX) injection 40 mg (40 mg Intravenous Given 07/10/20 0914)  dicyclomine (BENTYL) capsule 10 mg (10 mg Oral Given 07/10/20 0913)  potassium chloride SA (KLOR-CON) CR tablet 40 mEq (40 mEq Oral Given 07/10/20 0952)  fentaNYL (SUBLIMAZE) injection 50 mcg (50 mcg Intravenous Given 07/10/20 8016)    ED Course  I have reviewed the triage vital signs and the nursing notes.  Pertinent labs & imaging results that were available during my care of the patient were reviewed by me and considered in my medical decision making (see chart for details).    MDM Rules/Calculators/A&P                          Patient is a 44 year old female who presents with epigastric pain associated with nausea vomiting.  She has had similar symptoms in the past.  She is currently on an acid reducer, Carafate and Bentyl.  She says that this started after eating hush puppies last night with butter.  Her symptoms seem to be consistent with symptoms she has had in the past.  She is followed by gastroenterology.  Her labs show a mild hypokalemia but otherwise  nonconcerning.  Her LFTs are normal.  There is no suggestions of pancreatitis.  She was given antiemetics and IV fluids as well as GI cocktail.  She also was given 1 dose of fentanyl.  She improved most after the GI cocktail.  Repeat abdominal exam is nonconcerning.  I do not feel at this point that she needs further imaging studies.  She was  discharged home in good condition.  She was encouraged to follow back up with her gastroenterologist.  Criss Rosales diet was recommended.  Return precautions were given.   Final Clinical Impression(s) / ED Diagnoses Final diagnoses:  Epigastric pain  Hypokalemia    Rx / DC Orders ED Discharge Orders    None       Malvin Johns, MD 07/10/20 1231

## 2020-07-16 ENCOUNTER — Other Ambulatory Visit: Payer: Self-pay

## 2020-07-16 ENCOUNTER — Ambulatory Visit (HOSPITAL_BASED_OUTPATIENT_CLINIC_OR_DEPARTMENT_OTHER)
Admission: RE | Admit: 2020-07-16 | Discharge: 2020-07-16 | Disposition: A | Discharge status: Home / Self Care | Payer: 59 | Source: Ambulatory Visit | Attending: Hematology and Oncology | Admitting: Hematology and Oncology

## 2020-07-16 DIAGNOSIS — C562 Malignant neoplasm of left ovary: Secondary | ICD-10-CM | POA: Insufficient documentation

## 2020-07-16 MED ORDER — IOHEXOL 300 MG/ML  SOLN
100.0000 mL | Freq: Once | INTRAMUSCULAR | Status: AC | PRN
  Administered 2020-07-16: 100 mL via INTRAVENOUS

## 2020-07-17 ENCOUNTER — Other Ambulatory Visit: Payer: Self-pay | Admitting: *Deleted

## 2020-07-17 ENCOUNTER — Inpatient Hospital Stay (HOSPITAL_BASED_OUTPATIENT_CLINIC_OR_DEPARTMENT_OTHER): Payer: 59 | Admitting: Hematology and Oncology

## 2020-07-17 ENCOUNTER — Other Ambulatory Visit: Payer: Self-pay

## 2020-07-17 ENCOUNTER — Encounter: Payer: Self-pay | Admitting: Hematology and Oncology

## 2020-07-17 DIAGNOSIS — K2900 Acute gastritis without bleeding: Secondary | ICD-10-CM

## 2020-07-17 DIAGNOSIS — C562 Malignant neoplasm of left ovary: Secondary | ICD-10-CM

## 2020-07-17 DIAGNOSIS — E669 Obesity, unspecified: Secondary | ICD-10-CM | POA: Insufficient documentation

## 2020-07-17 NOTE — Assessment & Plan Note (Signed)
She has significant symptoms of reflux, leading to a recent ER visit With aggressive antiacid medications, her symptoms have improved We discussed the importance of weight loss in terms of management of the root of the GERD which is linked to obesity

## 2020-07-17 NOTE — Assessment & Plan Note (Signed)
We discussed the importance of weight loss to reduce cancer recurrence and to alleviate her symptoms of severe reflux I gave the patient some ideas and strategies for weight loss She appears motivated

## 2020-07-17 NOTE — Assessment & Plan Note (Signed)
I have reviewed imaging studies with the patient There were no signs of cancer I plan to see her again next month for further

## 2020-07-17 NOTE — Progress Notes (Signed)
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[place]) I have reviewed imaging studies with the patient There were no signs of cancer I plan to see her again next month for further  Obesity, Class III, BMI 40-49.9 (morbid obesity) ([place]) We discussed the importance of weight loss to reduce cancer recurrence and to alleviate her symptoms of severe reflux I gave the patient some ideas and strategies for weight loss She appears motivated  Gastritis She has significant symptoms of reflux, leading to a recent ER visit With aggressive antiacid medications, her symptoms have improved We discussed the importance of weight loss in terms of management of the root of the GERD which is linked to obesity   No orders of the defined types were placed in this encounter.   All questions were answered. The patient knows to call the clinic with any problems, questions or concerns. The total time spent in the appointment was 20 minutes encounter with patients including review of chart and various tests results, discussions about plan of care and coordination of care plan   Heath Lark, MD 07/17/2020 9:23 AM  INTERVAL HISTORY: Please see below for problem oriented charting. 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 There is a 16 cm complex cystic mass in the pelvis containing thickened septations located near midline, abutting the superior aspect of the uterus. I suspect this mass is adnexal/ovarian in origin rather than uterine in origin. Specifically, I  suspect the mass likely arises from the left ovary/adnexa and is most consistent with a neoplasm. Malignancy is certainly not excluded on this study. Recommend a pelvic ultrasound for further evaluation. 2. The rounded more solid-appearing structure in the right side of the pelvis is probably the right ovary. Recommend attention on ultrasound. 3. The small amount of fluid in the pelvis is likely reactive to the complex cystic mass likely rising from the left ovary/adnexa. 4. No cause for blood in vomit or black stools identified. No convincing evidence of a perforated or inflamed gastric or duodenal ulcer. 5. Hepatic steatosis.   07/02/2019 Imaging   MRI pelvis 1. 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 There are 2 enhancing nodules within the central upper abdomen raising the possibility of peritoneal carcinomatosis. Additionally, there is a small amount fluid within the pelvis. Malignant ascites not excluded. 2. Fibroid uterus. 3. Hepatic steatosis.   07/02/2019 Imaging   US pelvis 1. There is a large mass in the pelvis also seen on CT imaging. A separate left ovary is not visualized. 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 The mass is suspicious for malignancy. Recommend gynecologic consultation. 2. The right ovary demonstrates an irregular ill-defined hypoechoic hypervascular region centrally. The findings are nonspecific in the right ovary. However, given the apparent left ovarian neoplasm suspicious for malignancy, recommend an MRI to further assess the right ovary. 3. Uterine fibroid measuring 3.6 cm.     07/21/2019 Pathology Results  FINAL MICROSCOPIC DIAGNOSIS: A. 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 - See oncology table. B. OMENTUM, RESECTION: - Omental lymph node with metastatic carcinoma (1/1). -  Omental adipose tissue with foci of inflammation and reactive mesothelial changes. C. FALLOPIAN TUBE, LEFT, RESECTION: - Fallopian tube with focal, mild inflammation and fibrosis. - No tumor identified. D. UTERUS, CERVIX, RIGHT TUBE AND OVARY: - Cervix Nabothian cyst and squamous metaplasia. - Endometrium Proliferative. No hyperplasia or carcinoma. - Myometrium Leiomyomata. No malignancy. -Right ovary Poorly differentiated carcinoma, 4.8 cm. See oncology table and comment. -Right Fallopian tube Benign paratubal cyst. No endometriosis or malignancy. ONCOLOGY TABLE: OVARY or FALLOPIAN TUBE or PRIMARY PERITONEUM: Procedure: Bilateral f-oophorectomy, hysterectomy and omentectomy. Specimen Integrity: Intact Tumor Site: Left ovary and right ovary. See comment. Ovarian Surface Involvement: Not identified. Fallopian Tube Surface Involvement: Not identified. Tumor Size: Left ovary: 18.6 x 16.0 x 7.2 cm. Right ovary: 4.8 x 3.2 x 3.2 cm. Histologic Type: Clear cell carcinoma. See comment. Histologic Grade: High-grade. Other Tissue/ Organ Involvement: Omental lymph node with metastatic carcinoma. Peritoneal/Ascitic Fluid: Pending. Treatment Effect: No known presurgical therapy. Pathologic Stage Classification (pTNM, AJCC 8th Edition): pT3a, pN1a. Representative Tumor Block: A2, A3, A4, A5 A6, A7 and A8. Comment(s): The left ovarian tumor is 18.6 cm in greatest dimension and has features of clear cell carcinoma. The right ovarian tumor is 4.8 cm in greatest dimension and is poorly differentiated with focal features consistent with clear cell carcinoma. The pattern of involvement suggests that the right ovarian tumor is likely metastatic from the larger left ovarian clear cell carcinoma. Sections of the omentum show a lymph node with metastatic carcinoma. Sections of the remainder of the omentum do not show involvement by carcinoma.   07/21/2019 Surgery   Procedure(s) Performed: Exploratory  laparotomy with radical tumor debulking including total hysterectomy, bilateral salpingo-oophorectomy, infra-gastric omentectomy, and washings.   Surgeon: Jeral Pinch, MD    Operative Findings: On EUA, mobile uterus and ovarian mass, situated out of the pelvis, spanning 6-8 cm above the umbilicus.  On intra-abdominal entry, inflamed omentum adherent to much of the 16 cm left ovarian mass, adhesions easily lysed bluntly.  No nodularity of the ovarian mass however on delivery of the mass through the midline incision, there was Intra-Op rupture of one of the smaller cystic components with drainage of clear brown-tinged fluid.  Grossly normal-appearing left fallopian tube as well as right tube.  Right ovary mildly enlarged measuring approximately 4 cm and firm/nodular, suspicious for tumor involvement.  Uterus 8-10 cm with 4 cm anterior mid body fibroid.  Some very small volume miliary disease along right pelvic sidewall, removed with uterine specimen.  Evidence of small diverticular disease.  Small bowel normal appearing although multiple loops of bowel adherent to each other, especially by mesentery, and an inflammatory manner.  An approximately 2 x 2 centimeter nodule suspicious for tumor implant was found in the omentum just inferior to the greater curvature of the stomach.  Additional small (less than 5 mm) implants noted on the stomach and posterior aspect of the left lobe of the liver.  Otherwise the liver surface and diaphragm were smooth.   08/03/2019 Cancer Staging   Staging form: Ovary, Fallopian Tube, and Primary Peritoneal Carcinoma, AJCC 8th Edition - Pathologic: Stage IIIA2 (pT3a, pN1, cM0) - Signed by Heath Lark, MD on 08/03/2019   08/12/2019 Procedure   Placement of single lumen port a cath via right internal jugular vein. The catheter tip lies at the  cavo-atrial junction. A power injectable port a cath was placed and is ready for immediate use.   08/22/2019 -  Chemotherapy   The  patient had carboplatin and taxol for chemotherapy treatment.     09/23/2019 Genetic Testing   Negative genetic testing:  No pathogenic variants detected on the [facility] TumorNext-HRD+CancerNext paired germline and somatic genetic test. The report date is 09/23/2019.  The TumorNext-HRD+CancerNext test offered by [facility] includes paired germline and tumor analyses of 11 genes associated with homologous recombination repair (ATM, BARD1, BRIP1, CHEK2, MRE11A, NBN, PALB2, RAD51C, RAD51D, BRCA1, BRCA2) plus germline analyses of 26 additional genes associated with hereditary cancer (APC, AXIN2, BMPR1A, CDH1, CDK4, CDKN2A, DICER1, HOXB13, EPCAM, GREM1, MLH1, MSH2, MSH3, MSH6, MUTYH, NF1, NTHL1, PMS2, POLD1, POLE, PTEN, RECQL, SMAD4, SMARCA4, STK11, and TP53).   11/14/2019 Tumor Marker   Patient's tumor was tested for the following markers: CA-125 Results of the tumor marker test revealed 10.2.   12/05/2019 Tumor Marker   Patient's tumor was tested for the following markers: CA-125 Results of the tumor marker test revealed 9.9   01/05/2020 Imaging   No specific findings of active malignancy. Interval resection of the pelvic mass. Subtle nodularity along the right side of the vaginal cuff merits surveillance.     01/05/2020 Tumor Marker   Patient's tumor was tested for the following markers: CA-125 Results of the tumor marker test revealed 9.7   02/17/2020 Tumor Marker   Patient's tumor was tested for the following markers: CA-125 Results of the tumor marker test revealed 9.6   04/05/2020 Imaging   1. There is some minimal stranding along the mesentery and omentum without overt omental caking or well-defined nodularity. Faint bandlike density along the anterior peritoneal reflection in the pelvis, slightly more notable than on prior. Overall the appearance is not considered specific for recurrence but given the slight increase in prominence from 01/05/2020, would encourage careful surveillance by tumor markers  and imaging. 2. Mild lower lumbar degenerative facet arthropathy.   04/05/2020 Tumor Marker   Patient's tumor was tested for the following markers: CA-125 Results of the tumor marker test revealed 11   05/29/2020 Tumor Marker   Patient's tumor was tested for the following markers: CA-125. Results of the tumor marker test revealed 17.   07/04/2020 Tumor Marker   Patient's tumor was tested for the following markers: CA-125. Results of the tumor marker test revealed 20.1   07/16/2020 Imaging   Stable mild stranding throughout mesenteric and omental fat, without evidence of discrete masses or ascites. No evidence of new or progressive disease.     REVIEW OF SYSTEMS:   Constitutional: Denies fevers, chills or abnormal weight loss Eyes: Denies blurriness of vision Ears, nose, mouth, throat, and face: Denies mucositis or sore throat Respiratory: Denies cough, dyspnea or wheezes Cardiovascular: Denies palpitation, chest discomfort or lower extremity swelling Skin: Denies abnormal skin rashes Lymphatics: Denies new lymphadenopathy or easy bruising Neurological:Denies numbness, tingling or new weaknesses Behavioral/Psych: Mood is stable, no new changes  All other systems were reviewed with the patient and are negative.  I have reviewed the past medical history, past surgical history, social history and family history with the patient and they are unchanged from previous note.  ALLERGIES:  has No Known Allergies.  MEDICATIONS:  Current Outpatient Medications  Medication Sig Dispense Refill  . acetaminophen (TYLENOL) 500 MG tablet Take 1,000 mg by mouth every 6 (six) hours as needed for moderate pain.     Marland Kitchen dicyclomine (BENTYL) 10 MG capsule  Take 1 capsule (10 mg total) by mouth 3 (three) times daily before meals. 90 capsule 11  . estradiol (CLIMARA) 0.05 mg/24hr patch Place 1 patch (0.05 mg total) onto the skin once a week. 4 patch 6  . famotidine (PEPCID) 20 MG tablet Take 1 tablet (20  mg total) by mouth 2 (two) times daily. 60 tablet 1  . lidocaine-prilocaine (EMLA) cream Apply to affected area once 30 g 3  . metFORMIN (GLUCOPHAGE) 500 MG tablet TAKE 1 TABLET BY MOUTH TWICE DAILY WITH MEALS 60 tablet 1  . Multiple Vitamin (MULTIVITAMIN WITH MINERALS) TABS tablet Take 1 tablet by mouth daily.    . pantoprazole (PROTONIX) 40 MG tablet Take 1 tablet (40 mg total) by mouth 2 (two) times daily. 60 tablet 11  . sucralfate (CARAFATE) 1 g tablet Take 1 tablet (1 g total) by mouth 3 (three) times daily. 90 tablet 1   No current facility-administered medications for this visit.    PHYSICAL EXAMINATION: ECOG PERFORMANCE STATUS: 1 - Symptomatic but completely ambulatory  Vitals:   07/17/20 0846  BP: 133/89  Pulse: 95  Resp: 17  Temp: (!) 96.6 F (35.9 C)  SpO2: 100%   Filed Weights   07/17/20 0846  Weight: 218 lb 9.6 oz (99.2 kg)    GENERAL:alert, no distress and comfortable NEURO: alert & oriented x 3 with fluent speech, no focal motor/sensory deficits  LABORATORY DATA:  I have reviewed the data as listed    Component Value Date/Time   NA 139 07/10/2020 0834   K 3.2 (L) 07/10/2020 0834   CL 101 07/10/2020 0834   CO2 25 07/10/2020 0834   GLUCOSE 143 (H) 07/10/2020 0834   BUN 11 07/10/2020 0834   CREATININE 0.85 07/10/2020 0834   CREATININE 0.69 07/04/2020 1003   CALCIUM 9.4 07/10/2020 0834   PROT 8.1 07/10/2020 0834   ALBUMIN 4.4 07/10/2020 0834   AST 31 07/10/2020 0834   AST 25 07/04/2020 1003   ALT 35 07/10/2020 0834   ALT 32 07/04/2020 1003   ALKPHOS 91 07/10/2020 0834   BILITOT 0.6 07/10/2020 0834   BILITOT <0.2 (L) 07/04/2020 1003   GFRNONAA >60 07/10/2020 0834   GFRNONAA >60 07/04/2020 1003   GFRAA >60 04/05/2020 1008    No results found for: SPEP, UPEP  Lab Results  Component Value Date   WBC 10.9 (H) 07/10/2020   NEUTROABS 8.8 (H) 07/10/2020   HGB 12.8 07/10/2020   HCT 39.0 07/10/2020   MCV 88.0 07/10/2020   PLT 393 07/10/2020       Chemistry      Component Value Date/Time   NA 139 07/10/2020 0834   K 3.2 (L) 07/10/2020 0834   CL 101 07/10/2020 0834   CO2 25 07/10/2020 0834   BUN 11 07/10/2020 0834   CREATININE 0.85 07/10/2020 0834   CREATININE 0.69 07/04/2020 1003      Component Value Date/Time   CALCIUM 9.4 07/10/2020 0834   ALKPHOS 91 07/10/2020 0834   AST 31 07/10/2020 0834   AST 25 07/04/2020 1003   ALT 35 07/10/2020 0834   ALT 32 07/04/2020 1003   BILITOT 0.6 07/10/2020 0834   BILITOT <0.2 (L) 07/04/2020 1003       RADIOGRAPHIC STUDIES: I have reviewed multiple imaging studies with the patient I have personally reviewed the radiological images as listed and agreed with the findings in the report. CT ABDOMEN PELVIS W CONTRAST  Result Date: 07/16/2020 CLINICAL DATA:  Follow-up ovarian carcinoma. Previous surgery  and chemotherapy. EXAM: CT ABDOMEN AND PELVIS WITH CONTRAST TECHNIQUE: Multidetector CT imaging of the abdomen and pelvis was performed using the standard protocol following bolus administration of intravenous contrast. CONTRAST:  158m OMNIPAQUE IOHEXOL 300 MG/ML  SOLN COMPARISON:  04/05/2020 FINDINGS: Lower Chest: No acute findings. Hepatobiliary: No hepatic masses identified. Prior cholecystectomy. No evidence of biliary obstruction. Pancreas:  No mass or inflammatory changes. Spleen: Within normal limits in size and appearance. Adrenals/Urinary Tract: No masses identified. No evidence of ureteral calculi or hydronephrosis. Stomach/Bowel: No evidence of obstruction, inflammatory process or abnormal fluid collections. Normal appendix visualized. Vascular/Lymphatic: No pathologically enlarged lymph nodes. No abdominal aortic aneurysm. Reproductive: Prior hysterectomy again noted. Mild soft tissue stranding is again seen throughout the mesenteric and omental fat, however no discrete masses or ascites visualized. Other:  None. Musculoskeletal:  No suspicious bone lesions identified. IMPRESSION: Stable  mild stranding throughout mesenteric and omental fat, without evidence of discrete masses or ascites. No evidence of new or progressive disease. Electronically Signed   By: JMarlaine HindM.D.   On: 07/16/2020 16:02

## 2020-07-19 ENCOUNTER — Ambulatory Visit: Payer: 59 | Admitting: Nurse Practitioner

## 2020-07-24 ENCOUNTER — Ambulatory Visit: Payer: 59 | Admitting: Hematology and Oncology

## 2020-07-24 ENCOUNTER — Other Ambulatory Visit: Payer: 59

## 2020-08-08 ENCOUNTER — Other Ambulatory Visit: Payer: Self-pay | Admitting: Hematology and Oncology

## 2020-08-08 MED FILL — PANTOPRAZOLE SOD DR 40 MG T: 40 | 30 days supply | Qty: 30 | Fill #2

## 2020-08-08 MED FILL — METFORMIN HCL 500 MG TABS: 500 | 30 days supply | Qty: 60 | Fill #0

## 2020-08-29 ENCOUNTER — Encounter: Payer: Self-pay | Admitting: *Deleted

## 2020-08-29 ENCOUNTER — Inpatient Hospital Stay: Payer: 59 | Attending: Gynecologic Oncology | Admitting: Hematology and Oncology

## 2020-08-29 ENCOUNTER — Other Ambulatory Visit: Payer: Self-pay | Admitting: Hematology and Oncology

## 2020-08-29 ENCOUNTER — Inpatient Hospital Stay: Payer: 59

## 2020-08-29 ENCOUNTER — Other Ambulatory Visit: Payer: Self-pay

## 2020-08-29 DIAGNOSIS — R1013 Epigastric pain: Secondary | ICD-10-CM | POA: Insufficient documentation

## 2020-08-29 DIAGNOSIS — E876 Hypokalemia: Secondary | ICD-10-CM | POA: Diagnosis not present

## 2020-08-29 DIAGNOSIS — Z6839 Body mass index (BMI) 39.0-39.9, adult: Secondary | ICD-10-CM | POA: Insufficient documentation

## 2020-08-29 DIAGNOSIS — C562 Malignant neoplasm of left ovary: Secondary | ICD-10-CM

## 2020-08-29 DIAGNOSIS — Z79899 Other long term (current) drug therapy: Secondary | ICD-10-CM | POA: Diagnosis not present

## 2020-08-29 DIAGNOSIS — E669 Obesity, unspecified: Secondary | ICD-10-CM

## 2020-08-29 DIAGNOSIS — D638 Anemia in other chronic diseases classified elsewhere: Secondary | ICD-10-CM | POA: Diagnosis not present

## 2020-08-29 DIAGNOSIS — K760 Fatty (change of) liver, not elsewhere classified: Secondary | ICD-10-CM | POA: Diagnosis not present

## 2020-08-29 DIAGNOSIS — R1900 Intra-abdominal and pelvic swelling, mass and lump, unspecified site: Secondary | ICD-10-CM

## 2020-08-29 LAB — CMP (CANCER CENTER ONLY)
ALT: 19 U/L (ref 0–44)
AST: 18 U/L (ref 15–41)
Albumin: 3.9 g/dL (ref 3.5–5.0)
Alkaline Phosphatase: 87 U/L (ref 38–126)
Anion gap: 9 (ref 5–15)
BUN: 7 mg/dL (ref 6–20)
CO2: 28 mmol/L (ref 22–32)
Calcium: 9.3 mg/dL (ref 8.9–10.3)
Chloride: 104 mmol/L (ref 98–111)
Creatinine: 0.65 mg/dL (ref 0.44–1.00)
GFR, Estimated: >60 mL/min (ref >60)
Glucose, Bld: 113 mg/dL — ABNORMAL HIGH (ref 70–99)
Potassium: 3.1 mmol/L — ABNORMAL LOW (ref 3.5–5.1)
Sodium: 141 mmol/L (ref 135–145)
Total Bilirubin: 0.4 mg/dL (ref 0.3–1.2)
Total Protein: 7.2 g/dL (ref 6.5–8.1)

## 2020-08-29 LAB — CBC WITH DIFFERENTIAL (CANCER CENTER ONLY)
Abs Immature Granulocytes: 0.01 10*3/uL (ref 0.00–0.07)
Basophils Absolute: 0 10*3/uL (ref 0.0–0.1)
Basophils Relative: 0 %
Eosinophils Absolute: 0.2 10*3/uL (ref 0.0–0.5)
Eosinophils Relative: 3 %
HCT: 33 % — ABNORMAL LOW (ref 36.0–46.0)
Hemoglobin: 10.6 g/dL — ABNORMAL LOW (ref 12.0–15.0)
Immature Granulocytes: 0 %
Lymphocytes Relative: 32 %
Lymphs Abs: 2.1 10*3/uL (ref 0.7–4.0)
MCH: 28.1 pg (ref 26.0–34.0)
MCHC: 32.1 g/dL (ref 30.0–36.0)
MCV: 87.5 fL (ref 80.0–100.0)
Monocytes Absolute: 0.3 10*3/uL (ref 0.1–1.0)
Monocytes Relative: 5 %
Neutro Abs: 3.9 10*3/uL (ref 1.7–7.7)
Neutrophils Relative %: 60 %
Platelet Count: 306 10*3/uL (ref 150–400)
RBC: 3.77 MIL/uL — ABNORMAL LOW (ref 3.87–5.11)
RDW: 12.3 % (ref 11.5–15.5)
WBC Count: 6.5 10*3/uL (ref 4.0–10.5)
nRBC: 0 % (ref 0.0–0.2)

## 2020-08-29 LAB — RESEARCH LABS

## 2020-08-29 MED ORDER — LIDOCAINE-PRILOCAINE 2.5-2.5 % EX CREA: TOPICAL_CREAM | CUTANEOUS | 3 refills | Status: DC

## 2020-08-29 MED ORDER — SUCRALFATE 1 G PO TABS: 1.0000 g | ORAL_TABLET | Freq: Three times a day (TID) | ORAL | 1 refills | Status: DC

## 2020-08-29 MED ORDER — HEPARIN SOD (PORK) LOCK FLUSH 100 UNIT/ML IV SOLN
250.0000 [IU] | Freq: Once | INTRAVENOUS | Status: AC
  Administered 2020-08-29: 500 [IU]
  Filled 2020-08-29: qty 5

## 2020-08-29 MED ORDER — SODIUM CHLORIDE 0.9% FLUSH
10.0000 mL | Freq: Once | INTRAVENOUS | Status: AC
  Administered 2020-08-29: 10 mL
  Filled 2020-08-29: qty 10

## 2020-08-29 MED ORDER — FAMOTIDINE 20 MG PO TABS: 20.0000 mg | ORAL_TABLET | Freq: Two times a day (BID) | ORAL | 1 refills | Status: DC

## 2020-08-29 MED ORDER — PANTOPRAZOLE SODIUM 40 MG PO TBEC: 40.0000 mg | DELAYED_RELEASE_TABLET | Freq: Two times a day (BID) | ORAL | 11 refills | Status: DC

## 2020-08-29 MED FILL — SUCRALFATE 1 GM TAB: 1 | 30 days supply | Qty: 90 | Fill #0

## 2020-08-29 MED FILL — FAMOTIDINE 20 MG TABLET: 20 | 30 days supply | Qty: 60 | Fill #0

## 2020-08-29 MED FILL — PANTOPRAZOLE SOD DR 40 MG T: 40 | 30 days supply | Qty: 60 | Fill #0

## 2020-08-29 MED FILL — LIDOCAINE-PRILOCAINE CREAM: 2.5-2.5 | 30 days supply | Qty: 30 | Fill #0

## 2020-08-29 NOTE — Research (Signed)
08/29/2020- at 2:04pm- S1714 week 66 study assessments  S1714, A PROSPECTIVE OBSERVATIONAL COHORT STUDY TO DEVELOP A PREDICTIVE MODEL OFTAXANE-INDUCED PERIPHERAL NEUROPATHY IN CANCER PATIENTS. 52 weeks Visit. Patient presented to the clinic alone forher routine lab/flush appointment and MD visit with Dr. Alvy Bimler.The research nurse met with the pt upon arrival to the cancer center. The pt verbalized that she did not have any neuropathy complaints.  PROs:Questionnairesweregiven to patient to complete in clinic prior toher appointments. Collected questionnaires and checked for completeness and accuracy. Labs:Optional whole blood for research collectedthis morning along with her [facility] labs. Treatment: The pt confirmed that her treatment has been completed since May 2021. She has not received any further treatment for her cancer.   History of Falls:pt denies any history of falls in the past 6 months.  Solicited Neuropathy Events:The pt denies any symptoms of neuropathy.  Dr. Alvy Bimler examined the pt and completed her 2 study forms (Follow up Physician Assessment and Solicited AE form.   Assessment for Interventions for CIPN:Reviewed with patient andCRFs completed.She denied using the following medications: Anti-depressants, Gabapentin, Narcotics, Tramadol, Ibuprofen, Acetaminophen, and Duloxetine. She said that she is still taking her [employer] "One a Day" Women's vitamin with vitamin E, B6, B12. Pt reports she is still taking her Fish Oil, but she doesn't know her dosage.  The pt was encouraged to locate her dosage and to contact the research staff with this information. She denies using topical agents.  She also denies any complementary and alternative medicines since her last study visit.   Neuropen Assessment:Completed per protocol bythiscertified researchnurse with documentation assistance from Carol Ada, [place] Coordinator I. [place] Assessment:Completed per  protocol by this Film/video editor, time and documentationwith assistance from [employer], [employer] I. Timed Get Up and Go Test: the pt completed the test in 8.9 seconds.  Plan:Informed patient of next study assessments at week 104.Plan for this visit to beinJanuary 2023.Patient denied having any questions at this time. Patient thanked for her time and contribution to study and was encouraged to call clinic for any questions or concerns she may have prior to her next appointment. Brion Aliment RN, BSN, CCRP Clinical Research Nurse Lead 08/29/2020 2:13 PM

## 2020-08-29 NOTE — Patient Instructions (Signed)

## 2020-08-30 ENCOUNTER — Encounter: Payer: Self-pay | Admitting: Oncology

## 2020-08-30 ENCOUNTER — Telehealth: Payer: Self-pay | Admitting: Oncology

## 2020-08-30 ENCOUNTER — Encounter: Payer: Self-pay | Admitting: Hematology and Oncology

## 2020-08-30 DIAGNOSIS — E876 Hypokalemia: Secondary | ICD-10-CM | POA: Insufficient documentation

## 2020-08-30 LAB — CA 125: Cancer Antigen (CA) 125: 32.7 U/mL (ref 0.0–38.1)

## 2020-08-30 NOTE — Progress Notes (Signed)
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[place]) The patient continues to have severe epigastric pain despite aggressive management for possible stomach ulcer She has not been evaluated by GI service She is taking famotidine, pantoprazole and sucralfate which improved her symptoms but she is still bothered by it She has some recent weight loss At the time of dictation, her tumor marker came back within normal range but elevated compared to her baseline I plan to order CT imaging for evaluation, and to rule out cancer as a cause of her abdominal pain  Anemia, chronic disease This is multifactorial, likely anemia of chronic disease She is not symptomatic Observe for now  Hypokalemia She has mild hypokalemia but not symptomatic Observe for now  Obesity, Class II, BMI 35-39.9 She has lost some weight through dietary efforts However, given her abdominal pain, I will order CT imaging to rule out malignancy as a cause of her recent weight loss   Orders Placed This Encounter  Procedures  . CT CHEST ABDOMEN PELVIS W CONTRAST    Standing Status:   Future    Standing Expiration Date:   08/30/2021    Order Specific Question:   Preferred imaging location?    Answer:   [facility]    Order Specific Question:   Radiology Contrast Protocol - do NOT remove file path    Answer:   [email or web address]    Order Specific Question:   Is patient pregnant?    Answer:   No    All questions were answered. The patient knows to call the clinic with any problems, questions or concerns. The total time spent in the appointment was 30 minutes encounter with patients including review of chart and various tests results, discussions about plan of care and coordination of care plan   Heath Lark, MD 08/30/2020 8:52 AM  INTERVAL  HISTORY: Please see below for problem oriented charting. She returns for further follow-up She continues to have persistent epigastric pain despite taking Pepcid, sucralfate and pantoprazole on a regular basis She has lost some weight but that is attributed to changes in eating habit No changes in bowel movement Denies residual neuropathy from prior treatment She continues to have concern whether her stomach pain is caused by cancer  SUMMARY OF ONCOLOGIC HISTORY: Oncology History Overview Note  Clear cell features Negative genetics   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HCC)  07/02/2019 Imaging   Ct scan of abdomen and pelvis 1. There is a 16 cm complex cystic mass in the pelvis containing thickened septations located near midline, abutting the superior aspect of the uterus. I suspect this mass is adnexal/ovarian in origin rather than uterine in origin. Specifically, I suspect the mass likely arises from the left ovary/adnexa and is most consistent with a neoplasm. Malignancy is certainly not excluded on this study. Recommend a pelvic ultrasound for further evaluation. 2. The rounded more solid-appearing structure in the right side of the pelvis is probably the right ovary. Recommend attention on ultrasound. 3. The small amount of fluid in the pelvis is likely reactive to the complex cystic mass likely rising from the left ovary/adnexa. 4. No cause for blood in vomit or black stools identified. No convincing evidence of a perforated or inflamed gastric or duodenal ulcer. 5. Hepatic steatosis.   07/02/2019 Imaging   MRI pelvis 1. There is a large (15  cm) solid and cystic mass which appears to be arising from the left ovary concerning for cystic ovarian neoplasm. There are 2 enhancing nodules within the central upper abdomen raising the possibility of peritoneal carcinomatosis. Additionally, there is a small amount fluid within the pelvis. Malignant ascites not excluded. 2. Fibroid uterus. 3.  Hepatic steatosis.   07/02/2019 Imaging   US pelvis 1. There is a large mass in the pelvis also seen on CT imaging. A separate left ovary is not visualized. 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 The mass is suspicious for malignancy. Recommend gynecologic consultation. 2. The right ovary demonstrates an irregular ill-defined hypoechoic hypervascular region centrally. The findings are nonspecific in the right ovary. However, given the apparent left ovarian neoplasm suspicious for malignancy, recommend an MRI to further assess the right ovary. 3. Uterine fibroid measuring 3.6 cm.     07/21/2019 Pathology Results   FINAL MICROSCOPIC DIAGNOSIS: A. 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 - See oncology table. B. OMENTUM, RESECTION: - Omental lymph node with metastatic carcinoma (1/1). - Omental adipose tissue with foci of inflammation and reactive mesothelial changes. C. FALLOPIAN TUBE, LEFT, RESECTION: - Fallopian tube with focal, mild inflammation and fibrosis. - No tumor identified. D. UTERUS, CERVIX, RIGHT TUBE AND OVARY: - Cervix Nabothian cyst and squamous metaplasia. - Endometrium Proliferative. No hyperplasia or carcinoma. - Myometrium Leiomyomata. No malignancy. -Right ovary Poorly differentiated carcinoma, 4.8 cm. See oncology table and comment. -Right Fallopian tube Benign paratubal cyst. No endometriosis or malignancy. ONCOLOGY TABLE: OVARY or FALLOPIAN TUBE or PRIMARY PERITONEUM: Procedure: Bilateral f-oophorectomy, hysterectomy and omentectomy. Specimen Integrity: Intact Tumor Site: Left ovary and right ovary. See comment. Ovarian Surface Involvement: Not identified. Fallopian Tube Surface Involvement: Not identified. Tumor Size: Left ovary: 18.6 x 16.0 x 7.2 cm. Right ovary: 4.8 x 3.2 x 3.2 cm. Histologic Type: Clear cell carcinoma. See comment. Histologic Grade:  High-grade. Other Tissue/ Organ Involvement: Omental lymph node with metastatic carcinoma. Peritoneal/Ascitic Fluid: Pending. Treatment Effect: No known presurgical therapy. Pathologic Stage Classification (pTNM, AJCC 8th Edition): pT3a, pN1a. Representative Tumor Block: A2, A3, A4, A5 A6, A7 and A8. Comment(s): The left ovarian tumor is 18.6 cm in greatest dimension and has features of clear cell carcinoma. The right ovarian tumor is 4.8 cm in greatest dimension and is poorly differentiated with focal features consistent with clear cell carcinoma. The pattern of involvement suggests that the right ovarian tumor is likely metastatic from the larger left ovarian clear cell carcinoma. Sections of the omentum show a lymph node with metastatic carcinoma. Sections of the remainder of the omentum do not show involvement by carcinoma.   07/21/2019 Surgery   Procedure(s) Performed: Exploratory laparotomy with radical tumor debulking including total hysterectomy, bilateral salpingo-oophorectomy, infra-gastric omentectomy, and washings.   Surgeon: Jeral Pinch, MD    Operative Findings: On EUA, mobile uterus and ovarian mass, situated out of the pelvis, spanning 6-8 cm above the umbilicus.  On intra-abdominal entry, inflamed omentum adherent to much of the 16 cm left ovarian mass, adhesions easily lysed bluntly.  No nodularity of the ovarian mass however on delivery of the mass through the midline incision, there was Intra-Op rupture of one of the smaller cystic components with drainage of clear brown-tinged fluid.  Grossly normal-appearing left fallopian tube as well as right tube.  Right ovary mildly enlarged measuring approximately 4 cm and firm/nodular, suspicious for tumor involvement.  Uterus 8-10 cm with 4 cm  anterior mid body fibroid.  Some very small volume miliary disease along right pelvic sidewall, removed with uterine specimen.  Evidence of small diverticular disease.  Small bowel normal  appearing although multiple loops of bowel adherent to each other, especially by mesentery, and an inflammatory manner.  An approximately 2 x 2 centimeter nodule suspicious for tumor implant was found in the omentum just inferior to the greater curvature of the stomach.  Additional small (less than 5 mm) implants noted on the stomach and posterior aspect of the left lobe of the liver.  Otherwise the liver surface and diaphragm were smooth.   08/03/2019 Cancer Staging   Staging form: Ovary, Fallopian Tube, and Primary Peritoneal Carcinoma, AJCC 8th Edition - Pathologic: Stage IIIA2 (pT3a, pN1, cM0) - Signed by Heath Lark, MD on 08/03/2019   08/12/2019 Procedure   Placement of single lumen port a cath via right internal jugular vein. The catheter tip lies at the cavo-atrial junction. A power injectable port a cath was placed and is ready for immediate use.   08/22/2019 -  Chemotherapy   The patient had carboplatin and taxol for chemotherapy treatment.     09/23/2019 Genetic Testing   Negative genetic testing:  No pathogenic variants detected on the [facility] TumorNext-HRD+CancerNext paired germline and somatic genetic test. The report date is 09/23/2019.  The TumorNext-HRD+CancerNext test offered by [facility] includes paired germline and tumor analyses of 11 genes associated with homologous recombination repair (ATM, BARD1, BRIP1, CHEK2, MRE11A, NBN, PALB2, RAD51C, RAD51D, BRCA1, BRCA2) plus germline analyses of 26 additional genes associated with hereditary cancer (APC, AXIN2, BMPR1A, CDH1, CDK4, CDKN2A, DICER1, HOXB13, EPCAM, GREM1, MLH1, MSH2, MSH3, MSH6, MUTYH, NF1, NTHL1, PMS2, POLD1, POLE, PTEN, RECQL, SMAD4, SMARCA4, STK11, and TP53).   11/14/2019 Tumor Marker   Patient's tumor was tested for the following markers: CA-125 Results of the tumor marker test revealed 10.2.   12/05/2019 Tumor Marker   Patient's tumor was tested for the following markers: CA-125 Results of the tumor marker test revealed  9.9   01/05/2020 Imaging   No specific findings of active malignancy. Interval resection of the pelvic mass. Subtle nodularity along the right side of the vaginal cuff merits surveillance.     01/05/2020 Tumor Marker   Patient's tumor was tested for the following markers: CA-125 Results of the tumor marker test revealed 9.7   02/17/2020 Tumor Marker   Patient's tumor was tested for the following markers: CA-125 Results of the tumor marker test revealed 9.6   04/05/2020 Imaging   1. There is some minimal stranding along the mesentery and omentum without overt omental caking or well-defined nodularity. Faint bandlike density along the anterior peritoneal reflection in the pelvis, slightly more notable than on prior. Overall the appearance is not considered specific for recurrence but given the slight increase in prominence from 01/05/2020, would encourage careful surveillance by tumor markers and imaging. 2. Mild lower lumbar degenerative facet arthropathy.   04/05/2020 Tumor Marker   Patient's tumor was tested for the following markers: CA-125 Results of the tumor marker test revealed 11   05/29/2020 Tumor Marker   Patient's tumor was tested for the following markers: CA-125. Results of the tumor marker test revealed 17.   07/04/2020 Tumor Marker   Patient's tumor was tested for the following markers: CA-125. Results of the tumor marker test revealed 20.1   07/16/2020 Imaging   Stable mild stranding throughout mesenteric and omental fat, without evidence of discrete masses or ascites. No evidence of new or progressive  disease.   08/29/2020 Tumor Marker   Patient's tumor was tested for the following markers: CA-125 Results of the tumor marker test revealed 32.7.     REVIEW OF SYSTEMS:   Constitutional: Denies fevers, chills  Eyes: Denies blurriness of vision Ears, nose, mouth, throat, and face: Denies mucositis or sore throat Respiratory: Denies cough, dyspnea or  wheezes Cardiovascular: Denies palpitation, chest discomfort or lower extremity swelling Gastrointestinal:  Denies nausea, heartburn or change in bowel habits Skin: Denies abnormal skin rashes Lymphatics: Denies new lymphadenopathy or easy bruising Neurological:Denies numbness, tingling or new weaknesses Behavioral/Psych: Mood is stable, no new changes  All other systems were reviewed with the patient and are negative.  I have reviewed the past medical history, past surgical history, social history and family history with the patient and they are unchanged from previous note.  ALLERGIES:  has No Known Allergies.  MEDICATIONS:  Current Outpatient Medications  Medication Sig Dispense Refill  . acetaminophen (TYLENOL) 500 MG tablet Take 1,000 mg by mouth every 6 (six) hours as needed for moderate pain.     . famotidine (PEPCID) 20 MG tablet Take 1 tablet (20 mg total) by mouth 2 (two) times daily. 60 tablet 1  . lidocaine-prilocaine (EMLA) cream Apply to affected area once 30 g 3  . metFORMIN (GLUCOPHAGE) 500 MG tablet TAKE 1 TABLET BY MOUTH TWICE DAILY WITH MEALS 60 tablet 1  . Multiple Vitamin (MULTIVITAMIN WITH MINERALS) TABS tablet Take 1 tablet by mouth daily.    . pantoprazole (PROTONIX) 40 MG tablet Take 1 tablet (40 mg total) by mouth 2 (two) times daily. 60 tablet 11  . sucralfate (CARAFATE) 1 g tablet Take 1 tablet (1 g total) by mouth 3 (three) times daily. 90 tablet 1   No current facility-administered medications for this visit.    PHYSICAL EXAMINATION: ECOG PERFORMANCE STATUS: 1 - Symptomatic but completely ambulatory  Vitals:   08/29/20 1037  BP: (!) 146/94  Pulse: 75  Resp: 20  Temp: 97.8 F (36.6 C)  SpO2: 100%   Filed Weights   08/29/20 1037  Weight: 213 lb 6.4 oz (96.8 kg)    GENERAL:alert, no distress and comfortable SKIN: skin color, texture, turgor are normal, no rashes or significant lesions EYES: normal, Conjunctiva are pink and non-injected, sclera  clear OROPHARYNX:no exudate, no erythema and lips, buccal mucosa, and tongue normal  NECK: supple, thyroid normal size, non-tender, without nodularity LYMPH:  no palpable lymphadenopathy in the cervical, axillary or inguinal LUNGS: clear to auscultation and percussion with normal breathing effort HEART: regular rate & rhythm and no murmurs and no lower extremity edema ABDOMEN:abdomen soft, persistent discomfort in the epigastrium Musculoskeletal:no cyanosis of digits and no clubbing  NEURO: alert & oriented x 3 with fluent speech, no focal motor/sensory deficits  LABORATORY DATA:  I have reviewed the data as listed    Component Value Date/Time   NA 141 08/29/2020 1036   K 3.1 (L) 08/29/2020 1036   CL 104 08/29/2020 1036   CO2 28 08/29/2020 1036   GLUCOSE 113 (H) 08/29/2020 1036   BUN 7 08/29/2020 1036   CREATININE 0.65 08/29/2020 1036   CALCIUM 9.3 08/29/2020 1036   PROT 7.2 08/29/2020 1036   ALBUMIN 3.9 08/29/2020 1036   AST 18 08/29/2020 1036   ALT 19 08/29/2020 1036   ALKPHOS 87 08/29/2020 1036   BILITOT 0.4 08/29/2020 1036   GFRNONAA >60 08/29/2020 1036   GFRAA >60 04/05/2020 1008    No results found for: SPEP,  UPEP  Lab Results  Component Value Date   WBC 6.5 08/29/2020   NEUTROABS 3.9 08/29/2020   HGB 10.6 (L) 08/29/2020   HCT 33.0 (L) 08/29/2020   MCV 87.5 08/29/2020   PLT 306 08/29/2020      Chemistry      Component Value Date/Time   NA 141 08/29/2020 1036   K 3.1 (L) 08/29/2020 1036   CL 104 08/29/2020 1036   CO2 28 08/29/2020 1036   BUN 7 08/29/2020 1036   CREATININE 0.65 08/29/2020 1036      Component Value Date/Time   CALCIUM 9.3 08/29/2020 1036   ALKPHOS 87 08/29/2020 1036   AST 18 08/29/2020 1036   ALT 19 08/29/2020 1036   BILITOT 0.4 08/29/2020 1036

## 2020-08-30 NOTE — Telephone Encounter (Signed)
Called and given below message. She verbalized understanding. Appt scheduled. She is aware of appt time/date. 

## 2020-08-30 NOTE — Telephone Encounter (Signed)
I do not think it's UTI She can hold off until I see her back with CT Can you help schedule return visit Monday morning 2/7? 45 mins

## 2020-08-30 NOTE — Assessment & Plan Note (Signed)
She has mild hypokalemia but not symptomatic Observe for now 

## 2020-08-30 NOTE — Assessment & Plan Note (Signed)
This is multifactorial, likely anemia of chronic disease She is not symptomatic Observe for now 

## 2020-08-30 NOTE — Assessment & Plan Note (Signed)
The patient continues to have severe epigastric pain despite aggressive management for possible stomach ulcer She has not been evaluated by GI service She is taking famotidine, pantoprazole and sucralfate which improved her symptoms but she is still bothered by it She has some recent weight loss At the time of dictation, her tumor marker came back within normal range but elevated compared to her baseline I plan to order CT imaging for evaluation, and to rule out cancer as a cause of her abdominal pain

## 2020-08-30 NOTE — Assessment & Plan Note (Signed)
She has lost some weight through dietary efforts However, given her abdominal pain, I will order CT imaging to rule out malignancy as a cause of her recent weight loss

## 2020-08-30 NOTE — Telephone Encounter (Signed)
Attempted to call Sarah Newman but was not able to reach her due to her voicemail being full.  Sent her a Pharmacist, community message advising her that Dr. Alvy Bimler would like her to have a CT scan and provided her with the number for central scheduling.    She called back and has scheduled the CT for 09/07/20 at 8:00.  She is wondering if she should call and make an appointment with Dr. Fuller Plan with [facility].  She is also wondering if her CA 125 could be going up due to a UTI.  Asked if she is having dysuria, hematuria or frequency.  She said she only has frequency but thinks it is due to drinking a lot of water.  Advised her I would ask Dr. Alvy Bimler and call her back.

## 2020-09-07 ENCOUNTER — Other Ambulatory Visit: Payer: Self-pay

## 2020-09-07 ENCOUNTER — Encounter (HOSPITAL_COMMUNITY): Payer: Self-pay

## 2020-09-07 ENCOUNTER — Ambulatory Visit (HOSPITAL_COMMUNITY)
Admission: RE | Admit: 2020-09-07 | Discharge: 2020-09-07 | Disposition: A | Discharge status: Home / Self Care | Payer: 59 | Source: Ambulatory Visit | Attending: Hematology and Oncology | Admitting: Hematology and Oncology

## 2020-09-07 ENCOUNTER — Encounter: Payer: Self-pay | Admitting: Hematology and Oncology

## 2020-09-07 DIAGNOSIS — C562 Malignant neoplasm of left ovary: Secondary | ICD-10-CM | POA: Diagnosis present

## 2020-09-07 MED ORDER — IOHEXOL 300 MG/ML  SOLN
100.0000 mL | Freq: Once | INTRAMUSCULAR | Status: AC | PRN
  Administered 2020-09-07: 100 mL via INTRAVENOUS

## 2020-09-07 MED ORDER — HEPARIN SOD (PORK) LOCK FLUSH 100 UNIT/ML IV SOLN
INTRAVENOUS | Status: AC
  Administered 2020-09-07: 500 [IU] via INTRAVENOUS
  Filled 2020-09-07: qty 5

## 2020-09-07 MED ORDER — HEPARIN SOD (PORK) LOCK FLUSH 100 UNIT/ML IV SOLN: 500.0000 [IU] | Freq: Once | INTRAVENOUS | Status: AC

## 2020-09-08 DIAGNOSIS — Z8616 Personal history of COVID-19: Secondary | ICD-10-CM

## 2020-09-08 HISTORY — DX: Personal history of COVID-19: Z86.16

## 2020-09-10 ENCOUNTER — Encounter: Payer: Self-pay | Admitting: Hematology and Oncology

## 2020-09-10 ENCOUNTER — Inpatient Hospital Stay: Payer: 59 | Attending: Gynecologic Oncology | Admitting: Hematology and Oncology

## 2020-09-10 DIAGNOSIS — T451X5A Adverse effect of antineoplastic and immunosuppressive drugs, initial encounter: Secondary | ICD-10-CM | POA: Insufficient documentation

## 2020-09-10 DIAGNOSIS — D849 Immunodeficiency, unspecified: Secondary | ICD-10-CM | POA: Insufficient documentation

## 2020-09-10 DIAGNOSIS — C562 Malignant neoplasm of left ovary: Secondary | ICD-10-CM

## 2020-09-10 DIAGNOSIS — G893 Neoplasm related pain (acute) (chronic): Secondary | ICD-10-CM | POA: Insufficient documentation

## 2020-09-10 DIAGNOSIS — U071 COVID-19: Secondary | ICD-10-CM | POA: Insufficient documentation

## 2020-09-10 DIAGNOSIS — Z5111 Encounter for antineoplastic chemotherapy: Secondary | ICD-10-CM | POA: Insufficient documentation

## 2020-09-10 DIAGNOSIS — R112 Nausea with vomiting, unspecified: Secondary | ICD-10-CM | POA: Insufficient documentation

## 2020-09-10 DIAGNOSIS — D6481 Anemia due to antineoplastic chemotherapy: Secondary | ICD-10-CM | POA: Insufficient documentation

## 2020-09-10 DIAGNOSIS — R634 Abnormal weight loss: Secondary | ICD-10-CM | POA: Insufficient documentation

## 2020-09-10 DIAGNOSIS — R5381 Other malaise: Secondary | ICD-10-CM | POA: Insufficient documentation

## 2020-09-10 DIAGNOSIS — R531 Weakness: Secondary | ICD-10-CM | POA: Insufficient documentation

## 2020-09-10 DIAGNOSIS — R109 Unspecified abdominal pain: Secondary | ICD-10-CM | POA: Insufficient documentation

## 2020-09-10 NOTE — Progress Notes (Signed)
HEMATOLOGY-ONCOLOGY ELECTRONIC VISIT PROGRESS NOTE  Patient Care Team: Patient, No Pcp Per as PCP - General (General Practice)  I connected with by EPIC video conference and verified that I am speaking with the correct person using two identifiers.  I discussed the limitations, risks, security and privacy concerns of performing an evaluation and management service by EPIC and the availability of in person appointments.  I also discussed with the patient that there may be a patient responsible charge related to this service. The patient expressed understanding and agreed to proceed.   ASSESSMENT & PLAN:  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HCC) I reviewed imaging studies with the patient and her husband Unfortunately, CT imaging this time showed evidence of disease recurrence I explained to the patient why surgical resection is not indicated We discussed and reviewed the NCCN guidelines We discussed the risk, benefits, side effects of carboplatin/paclitaxel/bevacizumab, versus carboplatin/gemcitabine/bevacizumab versus carboplatin/liposomal doxorubicin/bevacizumab. Ultimately, she is undecided I will get in touch with her again at the end of the week for her final decision Due to recent tested positive for COVID-19 infection, her start date of treatment will be delayed  Lab test positive for detection of COVID-19 virus I referred her to special clinic to be triaged to see if it is appropriate to receive monoclonal antibody infusion Clinically, she has some mild respiratory symptoms but not profoundly symptomatic   Orders Placed This Encounter  Procedures  . Ambulatory referral for Covid Treatment    Referral Priority:   Routine    Referral Type:   Auth/Cert    Referral Reason:   Specialty Services Required    Number of Visits Requested:   1    INTERVAL HISTORY: Please see below for problem oriented charting. The patient tested positive Saturday.  She has some mild respiratory  congestion.  Continue 28, she tested negative. She denies significant symptoms.  She has some mild congestion as above but no fevers or shortness of breath Her abdominal pain is about the same Her husband is available in this video visit  I reviewed imaging studies with the patient and her husband  SUMMARY OF ONCOLOGIC HISTORY: Oncology History Overview Note  Clear cell features Negative genetics   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[place])  07/02/2019 Imaging   Ct scan of abdomen and pelvis 1. There is a 16 cm complex cystic mass in the pelvis containing thickened septations located near midline, abutting the superior aspect of the uterus. I suspect this mass is adnexal/ovarian in origin rather than uterine in origin. Specifically, I suspect the mass likely arises from the left ovary/adnexa and is most consistent with a neoplasm. Malignancy is certainly not excluded on this study. Recommend a pelvic ultrasound for further evaluation. 2. The rounded more solid-appearing structure in the right side of the pelvis is probably the right ovary. Recommend attention on ultrasound. 3. The small amount of fluid in the pelvis is likely reactive to the complex cystic mass likely rising from the left ovary/adnexa. 4. No cause for blood in vomit or black stools identified. No convincing evidence of a perforated or inflamed gastric or duodenal ulcer. 5. Hepatic steatosis.   07/02/2019 Imaging   MRI pelvis 1. 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 There are 2 enhancing nodules within the central upper abdomen raising the possibility of peritoneal carcinomatosis. Additionally, there is a small amount fluid within the pelvis. Malignant ascites not  excluded. 2. Fibroid uterus. 3. Hepatic steatosis.   07/02/2019 Imaging   US pelvis 1. There is a large mass in the pelvis also seen on CT imaging. A separate left ovary  is not visualized. 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 The mass is suspicious for malignancy. Recommend gynecologic consultation. 2. The right ovary demonstrates an irregular ill-defined hypoechoic hypervascular region centrally. The findings are nonspecific in the right ovary. However, given the apparent left ovarian neoplasm suspicious for malignancy, recommend an MRI to further assess the right ovary. 3. Uterine fibroid measuring 3.6 cm.     07/21/2019 Pathology Results   FINAL MICROSCOPIC DIAGNOSIS: A. 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 - See oncology table. B. OMENTUM, RESECTION: - Omental lymph node with metastatic carcinoma (1/1). - Omental adipose tissue with foci of inflammation and reactive mesothelial changes. C. FALLOPIAN TUBE, LEFT, RESECTION: - Fallopian tube with focal, mild inflammation and fibrosis. - No tumor identified. D. UTERUS, CERVIX, RIGHT TUBE AND OVARY: - Cervix Nabothian cyst and squamous metaplasia. - Endometrium Proliferative. No hyperplasia or carcinoma. - Myometrium Leiomyomata. No malignancy. -Right ovary Poorly differentiated carcinoma, 4.8 cm. See oncology table and comment. -Right Fallopian tube Benign paratubal cyst. No endometriosis or malignancy. ONCOLOGY TABLE: OVARY or FALLOPIAN TUBE or PRIMARY PERITONEUM: Procedure: Bilateral f-oophorectomy, hysterectomy and omentectomy. Specimen Integrity: Intact Tumor Site: Left ovary and right ovary. See comment. Ovarian Surface Involvement: Not identified. Fallopian Tube Surface Involvement: Not identified. Tumor Size: Left ovary: 18.6 x 16.0 x 7.2 cm. Right ovary: 4.8 x 3.2 x 3.2 cm. Histologic Type: Clear cell carcinoma. See comment. Histologic Grade: High-grade. Other Tissue/ Organ Involvement: Omental lymph node with metastatic carcinoma. Peritoneal/Ascitic Fluid: Pending. Treatment Effect: No known  presurgical therapy. Pathologic Stage Classification (pTNM, AJCC 8th Edition): pT3a, pN1a. Representative Tumor Block: A2, A3, A4, A5 A6, A7 and A8. Comment(s): The left ovarian tumor is 18.6 cm in greatest dimension and has features of clear cell carcinoma. The right ovarian tumor is 4.8 cm in greatest dimension and is poorly differentiated with focal features consistent with clear cell carcinoma. The pattern of involvement suggests that the right ovarian tumor is likely metastatic from the larger left ovarian clear cell carcinoma. Sections of the omentum show a lymph node with metastatic carcinoma. Sections of the remainder of the omentum do not show involvement by carcinoma.   07/21/2019 Surgery   Procedure(s) Performed: Exploratory laparotomy with radical tumor debulking including total hysterectomy, bilateral salpingo-oophorectomy, infra-gastric omentectomy, and washings.   Surgeon: Jeral Pinch, MD    Operative Findings: On EUA, mobile uterus and ovarian mass, situated out of the pelvis, spanning 6-8 cm above the umbilicus.  On intra-abdominal entry, inflamed omentum adherent to much of the 16 cm left ovarian mass, adhesions easily lysed bluntly.  No nodularity of the ovarian mass however on delivery of the mass through the midline incision, there was Intra-Op rupture of one of the smaller cystic components with drainage of clear brown-tinged fluid.  Grossly normal-appearing left fallopian tube as well as right tube.  Right ovary mildly enlarged measuring approximately 4 cm and firm/nodular, suspicious for tumor involvement.  Uterus 8-10 cm with 4 cm anterior mid body fibroid.  Some very small volume miliary disease along right pelvic sidewall, removed with uterine specimen.  Evidence of small diverticular disease.  Small bowel normal appearing although multiple loops of bowel adherent to each other, especially by mesentery, and an inflammatory manner.  An  approximately 2 x 2 centimeter nodule  suspicious for tumor implant was found in the omentum just inferior to the greater curvature of the stomach.  Additional small (less than 5 mm) implants noted on the stomach and posterior aspect of the left lobe of the liver.  Otherwise the liver surface and diaphragm were smooth.   08/03/2019 Cancer Staging   Staging form: Ovary, Fallopian Tube, and Primary Peritoneal Carcinoma, AJCC 8th Edition - Pathologic: Stage IIIA2 (pT3a, pN1, cM0) - Signed by Heath Lark, MD on 08/03/2019   08/12/2019 Procedure   Placement of single lumen port a cath via right internal jugular vein. The catheter tip lies at the cavo-atrial junction. A power injectable port a cath was placed and is ready for immediate use.   08/22/2019 -  Chemotherapy   The patient had carboplatin and taxol for chemotherapy treatment.     09/23/2019 Genetic Testing   Negative genetic testing:  No pathogenic variants detected on the [facility] TumorNext-HRD+CancerNext paired germline and somatic genetic test. The report date is 09/23/2019.  The TumorNext-HRD+CancerNext test offered by [facility] includes paired germline and tumor analyses of 11 genes associated with homologous recombination repair (ATM, BARD1, BRIP1, CHEK2, MRE11A, NBN, PALB2, RAD51C, RAD51D, BRCA1, BRCA2) plus germline analyses of 26 additional genes associated with hereditary cancer (APC, AXIN2, BMPR1A, CDH1, CDK4, CDKN2A, DICER1, HOXB13, EPCAM, GREM1, MLH1, MSH2, MSH3, MSH6, MUTYH, NF1, NTHL1, PMS2, POLD1, POLE, PTEN, RECQL, SMAD4, SMARCA4, STK11, and TP53).   11/14/2019 Tumor Marker   Patient's tumor was tested for the following markers: CA-125 Results of the tumor marker test revealed 10.2.   12/05/2019 Tumor Marker   Patient's tumor was tested for the following markers: CA-125 Results of the tumor marker test revealed 9.9   01/05/2020 Imaging   No specific findings of active malignancy. Interval resection of the pelvic mass. Subtle nodularity along the right side of the vaginal  cuff merits surveillance.     01/05/2020 Tumor Marker   Patient's tumor was tested for the following markers: CA-125 Results of the tumor marker test revealed 9.7   02/17/2020 Tumor Marker   Patient's tumor was tested for the following markers: CA-125 Results of the tumor marker test revealed 9.6   04/05/2020 Imaging   1. There is some minimal stranding along the mesentery and omentum without overt omental caking or well-defined nodularity. Faint bandlike density along the anterior peritoneal reflection in the pelvis, slightly more notable than on prior. Overall the appearance is not considered specific for recurrence but given the slight increase in prominence from 01/05/2020, would encourage careful surveillance by tumor markers and imaging. 2. Mild lower lumbar degenerative facet arthropathy.   04/05/2020 Tumor Marker   Patient's tumor was tested for the following markers: CA-125 Results of the tumor marker test revealed 11   05/29/2020 Tumor Marker   Patient's tumor was tested for the following markers: CA-125. Results of the tumor marker test revealed 17.   07/04/2020 Tumor Marker   Patient's tumor was tested for the following markers: CA-125. Results of the tumor marker test revealed 20.1   07/16/2020 Imaging   Stable mild stranding throughout mesenteric and omental fat, without evidence of discrete masses or ascites. No evidence of new or progressive disease.   08/29/2020 Tumor Marker   Patient's tumor was tested for the following markers: CA-125 Results of the tumor marker test revealed 32.7.   09/07/2020 Imaging   1. Interval progression of peritoneal disease within the abdomen and pelvis. Tumor predominantly involves the serosal surface  of the large and small bowel loops. No signs of bowel obstruction identified at this time. 2. No evidence for metastatic disease to the chest.     REVIEW OF SYSTEMS:   Constitutional: Denies fevers, chills or abnormal weight loss Eyes: Denies  blurriness of vision Ears, nose, mouth, throat, and face: Denies mucositis or sore throat Skin: Denies abnormal skin rashes Lymphatics: Denies new lymphadenopathy or easy bruising Neurological:Denies numbness, tingling or new weaknesses Behavioral/Psych: Mood is stable, no new changes  Extremities: No lower extremity edema All other systems were reviewed with the patient and are negative.  I have reviewed the past medical history, past surgical history, social history and family history with the patient and they are unchanged from previous note.  ALLERGIES:  has No Known Allergies.  MEDICATIONS:  Current Outpatient Medications  Medication Sig Dispense Refill  . acetaminophen (TYLENOL) 500 MG tablet Take 1,000 mg by mouth every 6 (six) hours as needed for moderate pain.     . famotidine (PEPCID) 20 MG tablet Take 1 tablet (20 mg total) by mouth 2 (two) times daily. 60 tablet 1  . lidocaine-prilocaine (EMLA) cream Apply to affected area once 30 g 3  . metFORMIN (GLUCOPHAGE) 500 MG tablet TAKE 1 TABLET BY MOUTH TWICE DAILY WITH MEALS 60 tablet 1  . Multiple Vitamin (MULTIVITAMIN WITH MINERALS) TABS tablet Take 1 tablet by mouth daily.    . pantoprazole (PROTONIX) 40 MG tablet Take 1 tablet (40 mg total) by mouth 2 (two) times daily. 60 tablet 11  . sucralfate (CARAFATE) 1 g tablet Take 1 tablet (1 g total) by mouth 3 (three) times daily. 90 tablet 1   No current facility-administered medications for this visit.    PHYSICAL EXAMINATION: ECOG PERFORMANCE STATUS: 1 - Symptomatic but completely ambulatory  LABORATORY DATA:  I have reviewed the data as listed CMP Latest Ref Rng & Units 08/29/2020 07/10/2020 07/04/2020  Glucose 70 - 99 mg/dL 113(H) 143(H) 115(H)  BUN 6 - 20 mg/dL '7 11 11  ' Creatinine 0.44 - 1.00 mg/dL 0.65 0.85 0.69  Sodium 135 - 145 mmol/L 141 139 141  Potassium 3.5 - 5.1 mmol/L 3.1(L) 3.2(L) 3.5  Chloride 98 - 111 mmol/L 104 101 107  CO2 22 - 32 mmol/L '28 25 26  ' Calcium  8.9 - 10.3 mg/dL 9.3 9.4 9.3  Total Protein 6.5 - 8.1 g/dL 7.2 8.1 6.9  Total Bilirubin 0.3 - 1.2 mg/dL 0.4 0.6 <0.2(L)  Alkaline Phos 38 - 126 U/L 87 91 90  AST 15 - 41 U/L '18 31 25  ' ALT 0 - 44 U/L 19 35 32    Lab Results  Component Value Date   WBC 6.5 08/29/2020   HGB 10.6 (L) 08/29/2020   HCT 33.0 (L) 08/29/2020   MCV 87.5 08/29/2020   PLT 306 08/29/2020   NEUTROABS 3.9 08/29/2020     RADIOGRAPHIC STUDIES: I have personally reviewed the radiological images as listed and agreed with the findings in the report. CT CHEST ABDOMEN PELVIS W CONTRAST  Result Date: 09/07/2020 CLINICAL DATA:  Restaging ovarian cancer. EXAM: CT CHEST, ABDOMEN, AND PELVIS WITH CONTRAST TECHNIQUE: Multidetector CT imaging of the chest, abdomen and pelvis was performed following the standard protocol during bolus administration of intravenous contrast. CONTRAST:  147m OMNIPAQUE IOHEXOL 300 MG/ML  SOLN COMPARISON:  CT AP 07/16/2020.  CT CAP 01/05/2020 FINDINGS: CT CHEST FINDINGS Cardiovascular: Heart size appears within normal limits. No pericardial effusion. Mediastinum/Nodes: Normal appearance of the thyroid gland. The trachea appears  patent and is midline. Normal appearance of the esophagus. No enlarged lymph nodes. Lungs/Pleura: Bilateral lower lobe calcified granulomas. No suspicious lung nodules. Musculoskeletal: No chest wall mass or suspicious bone lesions identified. CT ABDOMEN PELVIS FINDINGS Hepatobiliary: No focal liver abnormality is seen. Status post cholecystectomy. No biliary dilatation. Pancreas: Unremarkable. No pancreatic ductal dilatation or surrounding inflammatory changes. Spleen: Normal in size without focal abnormality. Adrenals/Urinary Tract: Normal adrenal glands. No kidney mass or hydronephrosis identified. Urinary bladder is unremarkable. Stomach/Bowel: The stomach appears normal. The appendix is visualized and is within normal limits. No pathologic dilatation of the large or small bowel  loops. Soft tissue thickening along the serosal surface of the small and large bowel loops is again identified compatible with peritoneal disease. Subjectively this appears increased from the previous exam from 07/16/2020 Vascular/Lymphatic: No aneurysm.  No abdominopelvic adenopathy. Reproductive: Status post hysterectomy. No adnexal masses. Other: Moderate diffuse soft tissue thickening is identified along the peritoneal surface of the abdomen and pelvis. Multifocal areas of serosal involvement of the large and small bowel noted within the abdomen and pelvis. -For example, within the pelvis there is focal area interloop soft tissue with tethering of pelvic small bowel loops, image 72/5. Here central area of increased soft tissue measures 1.9 x 1.5 cm, image 72/5. Previously this area measured 1.8 x 1.4 cm. -Within the right abdomen there is progressive soft tissue along the serosal surface of the proximal transverse colon, image 44/5. -Within the central abdomen there is increased soft tissue thickening along the surface of the jejunal bowel loops, image 38/5. Musculoskeletal: No acute or significant osseous findings. IMPRESSION: 1. Interval progression of peritoneal disease within the abdomen and pelvis. Tumor predominantly involves the serosal surface of the large and small bowel loops. No signs of bowel obstruction identified at this time. 2. No evidence for metastatic disease to the chest. Electronically Signed   By: Kerby Moors M.D.   On: 09/07/2020 10:21    I discussed the assessment and treatment plan with the patient. The patient was provided an opportunity to ask questions and all were answered. The patient agreed with the plan and demonstrated an understanding of the instructions. The patient was advised to call back or seek an in-person evaluation if the symptoms worsen or if the condition fails to improve as anticipated.    I spent 40 minutes for the appointment reviewing test results, discuss  management and coordination of care.  Heath Lark, MD 09/10/2020 12:36 PM

## 2020-09-10 NOTE — Assessment & Plan Note (Signed)
I reviewed imaging studies with the patient and her husband Unfortunately, CT imaging this time showed evidence of disease recurrence I explained to the patient why surgical resection is not indicated We discussed and reviewed the NCCN guidelines We discussed the risk, benefits, side effects of carboplatin/paclitaxel/bevacizumab, versus carboplatin/gemcitabine/bevacizumab versus carboplatin/liposomal doxorubicin/bevacizumab. Ultimately, she is undecided I will get in touch with her again at the end of the week for her final decision Due to recent tested positive for COVID-19 infection, her start date of treatment will be delayed

## 2020-09-10 NOTE — Assessment & Plan Note (Signed)
I referred her to special clinic to be triaged to see if it is appropriate to receive monoclonal antibody infusion Clinically, she has some mild respiratory symptoms but not profoundly symptomatic

## 2020-09-11 ENCOUNTER — Other Ambulatory Visit: Payer: Self-pay | Admitting: Family

## 2020-09-11 ENCOUNTER — Telehealth: Payer: Self-pay | Admitting: Family

## 2020-09-11 NOTE — Telephone Encounter (Signed)
I connected by phone with Sarah Newman on 09/11/2020 at 11:34 AM to discuss the potential use of a new treatment for mild to moderate COVID-19 viral infection in non-hospitalized patients.  This patient is a 45 y.o. female that meets the FDA criteria for Emergency Use Authorization of COVID monoclonal antibody sotrovimab.  Has a (+) direct SARS-CoV-2 viral test result  Has mild or moderate COVID-19   Is NOT hospitalized due to COVID-19  Is within 10 days of symptom onset  Has at least one of the high risk factor(s) for progression to severe COVID-19 and/or hospitalization as defined in EUA.  Specific high risk criteria : BMI > 25 and Pregnancy  I have spoken and communicated the following to the patient or parent/caregiver regarding COVID monoclonal antibody treatment:  1. FDA has authorized the emergency use for the treatment of mild to moderate COVID-19 in adults and pediatric patients with positive results of direct SARS-CoV-2 viral testing who are 48 years of age and older weighing at least 40 kg, and who are at high risk for progressing to severe COVID-19 and/or hospitalization.  2. The significant known and potential risks and benefits of COVID monoclonal antibody, and the extent to which such potential risks and benefits are unknown.  3. Information on available alternative treatments and the risks and benefits of those alternatives, including clinical trials.  4. Patients treated with COVID monoclonal antibody should continue to self-isolate and use infection control measures (e.g., wear mask, isolate, social distance, avoid sharing personal items, clean and disinfect "high touch" surfaces, and frequent handwashing) according to CDC guidelines.   5. The patient or parent/caregiver has the option to accept or refuse COVID monoclonal antibody treatment.  After reviewing this information with the patient, the patient has agreed to receive one of the available covid 19 monoclonal  antibodies and will be provided an appropriate fact sheet prior to infusion. Loel Dubonnet, NP 09/11/2020 11:34 AM

## 2020-09-11 NOTE — Telephone Encounter (Signed)
Called to discuss with patient about COVID-19 symptoms and the use of one of the available treatments for those with mild to moderate Covid symptoms and at a high risk of hospitalization.  Pt appears to qualify for outpatient treatment due to co-morbid conditions and/or a member of an at-risk group in accordance with the FDA Emergency Use Authorization.    Symptom onset: Unknown Vaccinated: Yes Booster? Yes Immunocompromised? Yes - cancer Qualifiers: ethnicity, BMI, cancer  Unable to reach pt - Unable to leave VM as box was full. Sent MyChart message.   Sarah Newman

## 2020-09-12 ENCOUNTER — Other Ambulatory Visit: Payer: Self-pay | Admitting: Family

## 2020-09-12 ENCOUNTER — Ambulatory Visit (HOSPITAL_COMMUNITY)
Admission: RE | Admit: 2020-09-12 | Discharge: 2020-09-12 | Disposition: A | Discharge status: Home / Self Care | Payer: 59 | Source: Ambulatory Visit | Attending: Pulmonary Disease | Admitting: Pulmonary Disease

## 2020-09-12 DIAGNOSIS — U071 COVID-19: Secondary | ICD-10-CM | POA: Insufficient documentation

## 2020-09-12 MED ORDER — ALBUTEROL SULFATE HFA 108 (90 BASE) MCG/ACT IN AERS: 2.0000 | INHALATION_SPRAY | Freq: Once | RESPIRATORY_TRACT | Status: DC | PRN

## 2020-09-12 MED ORDER — METHYLPREDNISOLONE SODIUM SUCC 125 MG IJ SOLR: 125.0000 mg | Freq: Once | INTRAMUSCULAR | Status: DC | PRN

## 2020-09-12 MED ORDER — SOTROVIMAB 500 MG/8ML IV SOLN
500.0000 mg | Freq: Once | INTRAVENOUS | Status: AC
  Administered 2020-09-12: 500 mg via INTRAVENOUS

## 2020-09-12 MED ORDER — ONDANSETRON HCL 4 MG/2ML IJ SOLN
4.0000 mg | Freq: Once | INTRAMUSCULAR | Status: AC
  Administered 2020-09-12: 4 mg via INTRAVENOUS
  Filled 2020-09-12: qty 2

## 2020-09-12 MED ORDER — FAMOTIDINE IN NACL 20-0.9 MG/50ML-% IV SOLN: 20.0000 mg | Freq: Once | INTRAVENOUS | Status: DC | PRN

## 2020-09-12 MED ORDER — DIPHENHYDRAMINE HCL 50 MG/ML IJ SOLN: 50.0000 mg | Freq: Once | INTRAMUSCULAR | Status: DC | PRN

## 2020-09-12 MED ORDER — SODIUM CHLORIDE 0.9 % IV SOLN: INTRAVENOUS | Status: DC | PRN

## 2020-09-12 MED ORDER — EPINEPHRINE 0.3 MG/0.3ML IJ SOAJ: 0.3000 mg | Freq: Once | INTRAMUSCULAR | Status: DC | PRN

## 2020-09-12 MED ORDER — HEPARIN SOD (PORK) LOCK FLUSH 100 UNIT/ML IV SOLN: 500.0000 [IU] | INTRAVENOUS | Status: DC | PRN

## 2020-09-12 NOTE — Progress Notes (Signed)
Patient reviewed Fact Sheet for Patients, Parents, and Caregivers for Emergency Use Authorization (EUA) of sotrovimab for the Treatment of Coronavirus. Patient also reviewed and is agreeable to the estimated cost of treatment. Patient is agreeable to proceed.   

## 2020-09-12 NOTE — Discharge Instructions (Signed)

## 2020-09-12 NOTE — Progress Notes (Signed)
Diagnosis: COVID-19  Physician: Dr. Patrick Wright  Procedure: Covid Infusion Clinic Med: Sotrovimab infusion - Provided patient with sotrovimab fact sheet for patients, parents, and caregivers prior to infusion.   Complications: No immediate complications noted  Discharge: Discharged home    

## 2020-09-14 ENCOUNTER — Telehealth: Payer: Self-pay

## 2020-09-14 ENCOUNTER — Other Ambulatory Visit: Payer: Self-pay | Admitting: Hematology and Oncology

## 2020-09-14 ENCOUNTER — Telehealth: Payer: Self-pay | Admitting: Hematology and Oncology

## 2020-09-14 ENCOUNTER — Encounter: Payer: Self-pay | Admitting: Hematology and Oncology

## 2020-09-14 DIAGNOSIS — C562 Malignant neoplasm of left ovary: Secondary | ICD-10-CM

## 2020-09-14 DIAGNOSIS — G893 Neoplasm related pain (acute) (chronic): Secondary | ICD-10-CM | POA: Insufficient documentation

## 2020-09-14 MED ORDER — PROCHLORPERAZINE MALEATE 10 MG PO TABS: 10.0000 mg | ORAL_TABLET | Freq: Four times a day (QID) | ORAL | 1 refills | Status: DC | PRN

## 2020-09-14 MED ORDER — ONDANSETRON HCL 8 MG PO TABS: 8.0000 mg | ORAL_TABLET | Freq: Two times a day (BID) | ORAL | 1 refills | Status: DC | PRN

## 2020-09-14 MED ORDER — MORPHINE SULFATE 15 MG PO TABS: 15.0000 mg | ORAL_TABLET | ORAL | 0 refills | Status: DC | PRN

## 2020-09-14 MED FILL — ONDANSETRON HCL 8 MG TABLET: 8 | 15 days supply | Qty: 30 | Fill #0

## 2020-09-14 MED FILL — MORPHINE SULFATE IR 15 MG T: 15 | 10 days supply | Qty: 60 | Fill #0

## 2020-09-14 MED FILL — PROCHLORPERAZINE 10 MG TAB: 10 | 7 days supply | Qty: 30 | Fill #0

## 2020-09-14 NOTE — Telephone Encounter (Signed)
I sent prescription medicine for pain and nausea to [facility] I ordered ECHO, please schedule in a week or so The earliest day she can come in for Rx is 2/28, I will get scheduler to schedule

## 2020-09-14 NOTE — Progress Notes (Signed)
Pt is approved for the $1000 [employer] grant.  

## 2020-09-14 NOTE — Telephone Encounter (Signed)
Called and given below message. She verbalized understanding and has decided to get #3, Carboplatin, Doxil and Avantin.  She is complaining of nausea and nagging pain on left side. Asking for Rx's and ask if it could be sent to [facility] outpatient pharmacy. She has not been taking the Pepcid, Protonix or Carafate for 2 weeks. She cannot afford to pay at pharmacy. She has lost 4 lbs. She is able to stay hydrated and eat small amounts. She called Lenise in financial regarding grant and she told. Once a treatment plan is ordered she may qualify for grant again.

## 2020-09-14 NOTE — Telephone Encounter (Signed)
Called and given below message. She verbalized understanding. Given appt for echo on 2/24, arrive at 0945 for 10 am appt. Sent message to financial person to assist with medications.

## 2020-09-14 NOTE — Telephone Encounter (Signed)
Scheduled appt per 2/11 sch msg - pt is aware of appt date and time

## 2020-09-14 NOTE — Telephone Encounter (Signed)
-----   Message from Heath Lark, MD sent at 09/14/2020  8:01 AM EST ----- Regarding: call pt Can you call and ask how she is doing and whether she has made her decision

## 2020-09-14 NOTE — Progress Notes (Signed)
Called pt to discuss copay assistance.  Pt gave me consent to apply in her behalf so Iwas able toenrollher in the [employer] First Step program for[place] for $20,000 per calendar yearfrom2/11/22. Pt will pay $0 for her firstdose or cycleand $75for each subsequent dose or cycle.  She would like to try and apply again for the [place] so she will email me her husbands income.  If approved I will give her an expense sheet.

## 2020-09-14 NOTE — Progress Notes (Signed)
DISCONTINUE ON PATHWAY REGIMEN - Ovarian     A cycle is every 21 days:     Paclitaxel      Carboplatin   **Always confirm dose/schedule in your pharmacy ordering system**  REASON: Other Reason PRIOR TREATMENT: [id number]: Carboplatin AUC=6 + Paclitaxel 175 mg/m2 q21 Days x 6 Cycles for Complete Responders or 2 Cycles Past Best Response for Partial Responders TREATMENT RESPONSE: Complete Response (CR)  START ON PATHWAY REGIMEN - Ovarian     A cycle is every 28 days:     Carboplatin      Liposomal doxorubicin   **Always confirm dose/schedule in your pharmacy ordering system**  Patient Characteristics: Recurrent or Progressive Disease, Second Line, Platinum Sensitive and ? 6 Months Since Last Therapy, Not a Candidate for Secondary Debulking Surgery BRCA Mutation Status: Absent Therapeutic Status: Recurrent or Progressive Disease Line of Therapy: Second Line  Intent of Therapy: Non-Curative / Palliative Intent, Discussed with Patient

## 2020-09-19 ENCOUNTER — Telehealth: Payer: Self-pay

## 2020-09-19 ENCOUNTER — Other Ambulatory Visit: Payer: Self-pay

## 2020-09-19 ENCOUNTER — Emergency Department (HOSPITAL_COMMUNITY): Payer: 59

## 2020-09-19 ENCOUNTER — Encounter (HOSPITAL_COMMUNITY): Payer: Self-pay

## 2020-09-19 ENCOUNTER — Other Ambulatory Visit: Payer: Self-pay | Admitting: Hematology and Oncology

## 2020-09-19 ENCOUNTER — Inpatient Hospital Stay (HOSPITAL_COMMUNITY)
Admission: EM | Admit: 2020-09-19 | Discharge: 2020-09-21 | DRG: 846 | Disposition: A | Discharge status: Home / Self Care | Payer: 59 | Attending: Internal Medicine | Admitting: Internal Medicine

## 2020-09-19 DIAGNOSIS — Z5111 Encounter for antineoplastic chemotherapy: Secondary | ICD-10-CM | POA: Diagnosis not present

## 2020-09-19 DIAGNOSIS — E669 Obesity, unspecified: Secondary | ICD-10-CM | POA: Diagnosis present

## 2020-09-19 DIAGNOSIS — R110 Nausea: Secondary | ICD-10-CM

## 2020-09-19 DIAGNOSIS — R1012 Left upper quadrant pain: Secondary | ICD-10-CM | POA: Diagnosis not present

## 2020-09-19 DIAGNOSIS — D638 Anemia in other chronic diseases classified elsewhere: Secondary | ICD-10-CM | POA: Diagnosis present

## 2020-09-19 DIAGNOSIS — R109 Unspecified abdominal pain: Secondary | ICD-10-CM | POA: Diagnosis not present

## 2020-09-19 DIAGNOSIS — Z7984 Long term (current) use of oral hypoglycemic drugs: Secondary | ICD-10-CM

## 2020-09-19 DIAGNOSIS — C762 Malignant neoplasm of abdomen: Secondary | ICD-10-CM | POA: Diagnosis present

## 2020-09-19 DIAGNOSIS — R112 Nausea with vomiting, unspecified: Secondary | ICD-10-CM

## 2020-09-19 DIAGNOSIS — E876 Hypokalemia: Secondary | ICD-10-CM | POA: Diagnosis present

## 2020-09-19 DIAGNOSIS — J45909 Unspecified asthma, uncomplicated: Secondary | ICD-10-CM | POA: Diagnosis present

## 2020-09-19 DIAGNOSIS — U071 COVID-19: Secondary | ICD-10-CM | POA: Diagnosis present

## 2020-09-19 DIAGNOSIS — K5900 Constipation, unspecified: Secondary | ICD-10-CM | POA: Diagnosis present

## 2020-09-19 DIAGNOSIS — C562 Malignant neoplasm of left ovary: Secondary | ICD-10-CM

## 2020-09-19 DIAGNOSIS — Z800 Family history of malignant neoplasm of digestive organs: Secondary | ICD-10-CM

## 2020-09-19 DIAGNOSIS — Z8711 Personal history of peptic ulcer disease: Secondary | ICD-10-CM

## 2020-09-19 DIAGNOSIS — Z9221 Personal history of antineoplastic chemotherapy: Secondary | ICD-10-CM | POA: Diagnosis not present

## 2020-09-19 DIAGNOSIS — C481 Malignant neoplasm of specified parts of peritoneum: Secondary | ICD-10-CM

## 2020-09-19 DIAGNOSIS — Z79899 Other long term (current) drug therapy: Secondary | ICD-10-CM | POA: Diagnosis not present

## 2020-09-19 DIAGNOSIS — E099 Drug or chemical induced diabetes mellitus without complications: Secondary | ICD-10-CM

## 2020-09-19 DIAGNOSIS — D630 Anemia in neoplastic disease: Secondary | ICD-10-CM | POA: Diagnosis present

## 2020-09-19 DIAGNOSIS — T380X5A Adverse effect of glucocorticoids and synthetic analogues, initial encounter: Secondary | ICD-10-CM | POA: Diagnosis present

## 2020-09-19 DIAGNOSIS — Z8041 Family history of malignant neoplasm of ovary: Secondary | ICD-10-CM

## 2020-09-19 DIAGNOSIS — C786 Secondary malignant neoplasm of retroperitoneum and peritoneum: Secondary | ICD-10-CM | POA: Diagnosis present

## 2020-09-19 LAB — URINALYSIS, ROUTINE W REFLEX MICROSCOPIC
Bilirubin Urine: NEGATIVE
Glucose, UA: NEGATIVE mg/dL
Hgb urine dipstick: NEGATIVE
Ketones, ur: NEGATIVE mg/dL
Leukocytes,Ua: NEGATIVE
Nitrite: NEGATIVE
Protein, ur: NEGATIVE mg/dL
Specific Gravity, Urine: >1.046 — ABNORMAL HIGH (ref 1.005–1.030)
pH: 6 (ref 5.0–8.0)

## 2020-09-19 LAB — DIFFERENTIAL
Abs Immature Granulocytes: 0.01 10*3/uL (ref 0.00–0.07)
Basophils Absolute: 0 10*3/uL (ref 0.0–0.1)
Basophils Relative: 0 %
Eosinophils Absolute: 0.1 10*3/uL (ref 0.0–0.5)
Eosinophils Relative: 2 %
Immature Granulocytes: 0 %
Lymphocytes Relative: 23 %
Lymphs Abs: 1.4 10*3/uL (ref 0.7–4.0)
Monocytes Absolute: 0.4 10*3/uL (ref 0.1–1.0)
Monocytes Relative: 7 %
Neutro Abs: 4.3 10*3/uL (ref 1.7–7.7)
Neutrophils Relative %: 68 %

## 2020-09-19 LAB — RESP PANEL BY RT-PCR (FLU A&B, COVID) ARPGX2
Influenza A by PCR: NEGATIVE
Influenza B by PCR: NEGATIVE
SARS Coronavirus 2 by RT PCR: POSITIVE — AB

## 2020-09-19 LAB — COMPREHENSIVE METABOLIC PANEL
ALT: 48 U/L — ABNORMAL HIGH (ref 0–44)
AST: 39 U/L (ref 15–41)
Albumin: 4.1 g/dL (ref 3.5–5.0)
Alkaline Phosphatase: 101 U/L (ref 38–126)
Anion gap: 11 (ref 5–15)
BUN: 8 mg/dL (ref 6–20)
CO2: 27 mmol/L (ref 22–32)
Calcium: 9.6 mg/dL (ref 8.9–10.3)
Chloride: 102 mmol/L (ref 98–111)
Creatinine, Ser: 0.66 mg/dL (ref 0.44–1.00)
GFR, Estimated: >60 mL/min (ref >60)
Glucose, Bld: 121 mg/dL — ABNORMAL HIGH (ref 70–99)
Potassium: 3.6 mmol/L (ref 3.5–5.1)
Sodium: 140 mmol/L (ref 135–145)
Total Bilirubin: 0.6 mg/dL (ref 0.3–1.2)
Total Protein: 7.9 g/dL (ref 6.5–8.1)

## 2020-09-19 LAB — CBC
HCT: 33.6 % — ABNORMAL LOW (ref 36.0–46.0)
Hemoglobin: 10.9 g/dL — ABNORMAL LOW (ref 12.0–15.0)
MCH: 28.8 pg (ref 26.0–34.0)
MCHC: 32.4 g/dL (ref 30.0–36.0)
MCV: 88.7 fL (ref 80.0–100.0)
Platelets: 342 10*3/uL (ref 150–400)
RBC: 3.79 MIL/uL — ABNORMAL LOW (ref 3.87–5.11)
RDW: 11.9 % (ref 11.5–15.5)
WBC: 6.5 10*3/uL (ref 4.0–10.5)
nRBC: 0 % (ref 0.0–0.2)

## 2020-09-19 LAB — LIPASE, BLOOD: Lipase: 36 U/L (ref 11–51)

## 2020-09-19 LAB — HEMOGLOBIN A1C
Hgb A1c MFr Bld: 6.3 % — ABNORMAL HIGH (ref 4.8–5.6)
Mean Plasma Glucose: 134.11 mg/dL

## 2020-09-19 LAB — I-STAT BETA HCG BLOOD, ED (MC, WL, AP ONLY): I-stat hCG, quantitative: <5 m[IU]/mL (ref <5)

## 2020-09-19 MED ORDER — ACETAMINOPHEN 650 MG RE SUPP: 650.0000 mg | Freq: Four times a day (QID) | RECTAL | Status: DC | PRN

## 2020-09-19 MED ORDER — SODIUM CHLORIDE 0.9 % IV SOLN: INTRAVENOUS | Status: DC

## 2020-09-19 MED ORDER — ONDANSETRON HCL 4 MG/2ML IJ SOLN
4.0000 mg | Freq: Once | INTRAMUSCULAR | Status: AC
  Administered 2020-09-19: 4 mg via INTRAVENOUS
  Filled 2020-09-19: qty 2

## 2020-09-19 MED ORDER — ACETAMINOPHEN 325 MG PO TABS
650.0000 mg | ORAL_TABLET | Freq: Four times a day (QID) | ORAL | Status: DC | PRN
  Administered 2020-09-20: 650 mg via ORAL
  Filled 2020-09-19: qty 2

## 2020-09-19 MED ORDER — LACTATED RINGERS IV BOLUS
1000.0000 mL | Freq: Once | INTRAVENOUS | Status: AC
  Administered 2020-09-19: 1000 mL via INTRAVENOUS

## 2020-09-19 MED ORDER — IOHEXOL 300 MG/ML  SOLN
100.0000 mL | Freq: Once | INTRAMUSCULAR | Status: AC | PRN
  Administered 2020-09-19: 100 mL via INTRAVENOUS

## 2020-09-19 MED ORDER — ONDANSETRON HCL 4 MG/2ML IJ SOLN
4.0000 mg | Freq: Four times a day (QID) | INTRAMUSCULAR | Status: DC | PRN
  Administered 2020-09-19 – 2020-09-20 (×2): 4 mg via INTRAVENOUS
  Filled 2020-09-19 (×2): qty 2

## 2020-09-19 MED ORDER — ONDANSETRON HCL 4 MG PO TABS: 4.0000 mg | ORAL_TABLET | Freq: Four times a day (QID) | ORAL | Status: DC | PRN

## 2020-09-19 MED ORDER — PROMETHAZINE HCL 25 MG/ML IJ SOLN
12.5000 mg | Freq: Once | INTRAMUSCULAR | Status: AC
  Administered 2020-09-19: 12.5 mg via INTRAVENOUS
  Filled 2020-09-19: qty 1

## 2020-09-19 MED ORDER — HYDROMORPHONE HCL 1 MG/ML IJ SOLN
1.0000 mg | INTRAMUSCULAR | Status: DC | PRN
  Administered 2020-09-19 – 2020-09-21 (×3): 1 mg via INTRAVENOUS
  Filled 2020-09-19 (×3): qty 1

## 2020-09-19 MED ORDER — MORPHINE SULFATE 2 MG/ML IJ SOLN: 2.0000 mg | INTRAMUSCULAR | Status: DC | PRN

## 2020-09-19 MED ORDER — MORPHINE SULFATE (PF) 2 MG/ML IV SOLN
2.0000 mg | INTRAVENOUS | Status: DC | PRN
  Administered 2020-09-19 (×2): 4 mg via INTRAVENOUS
  Filled 2020-09-19 (×2): qty 2

## 2020-09-19 NOTE — Telephone Encounter (Signed)
I will keep an eye on her chart 

## 2020-09-19 NOTE — ED Notes (Signed)
Pt given ice water, was able to drink with no issues or nausea. RN aware.

## 2020-09-19 NOTE — ED Provider Notes (Signed)
[facility] DEPT Provider Note   CSN: [id number] Arrival date & time: 09/19/20  1106     History Chief Complaint  Patient presents with  . Abdominal Pain    Sarah Newman is a 45 y.o. female.  HPI      Sarah Newman is a 45 y.o. female, with a history of anemia, asthma,, presenting to the ED with nausea and vomiting for the last three weeks.  Accompanied by abdominal pain, mostly in the left upper quadrant and epigastric regions, described as a constant aching or "nagging" pain, 7/10, nonradiating. States she has only been able to keep little bits of food down.  Had a BM yesterday after taking miralax.   Tested positive for COVID on Feb 5. One day of cough and headache, none since.  Previously diagosed with ovarian cancer dec 2020, 6 months chemo, clear scans even as of December 2021.  She had evidence of recurrence of her cancer on CT scan September 07, 2020.  Patient states her oncologist recommended she come to the ED for further evaluation of her abdominal pain, rule out obstruction, among other things.  Denies fever/chills, hematochezia/melena, urinary symptoms, syncope, chest pain, shortness of breath, or any other complaints.  Past Medical History:  Diagnosis Date  . Anemia   . Asthma   . Family history of breast cancer   . Family history of colon cancer   . Family history of ovarian cancer   . Family history of prostate cancer   . Pelvic mass   . Ulcer, stomach peptic     Patient Active Problem List   Diagnosis Date Noted  . 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 Lab test positive for detection of COVID-19 virus 09/10/2020  . Hypokalemia 08/30/2020  . Obesity, Class II, BMI 35-39.9 07/17/2020  . Anemia, chronic disease 05/29/2020  . UTI (urinary tract infection) 01/09/2020  . Dysuria 01/06/2020  . Anemia due to antineoplastic chemotherapy 12/05/2019  . Gastritis 12/05/2019  . Hot flashes due to surgical menopause  11/14/2019  . Weight gain 10/25/2019  . Vaginal dryness 10/24/2019  . Drug-induced hyperglycemia 10/03/2019  . Genetic testing 09/23/2019  . Bone pain 09/13/2019  . Restless leg 09/13/2019  . Elevated liver enzymes 09/13/2019  . Family history of ovarian cancer   . Family history of prostate cancer   . Family history of colon cancer   . Family history of breast cancer   . 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HCC)   . Pelvic mass in female 07/11/2019  . Obesity 07/11/2019  . Cholecystitis 02/16/2015    Past Surgical History:  Procedure Laterality Date  . CHOLECYSTECTOMY N/A 02/16/2015   Procedure: LAPAROSCOPIC CHOLECYSTECTOMY WITH INTRAOPERATIVE CHOLANGIOGRAM;  Surgeon: Johnathan Hausen, MD;  Location: [facility];  Service: General;  Laterality: N/A;  . IR IMAGING GUIDED PORT INSERTION  08/12/2019  . SALPINGOOPHORECTOMY N/A 07/21/2019   Procedure: EXPLORATORY LAPAROTOMY, TOTAL ABDOMINAL HYSTERECTOMY, BILATERAL SALPINGO OOPHORECTOMY, OMENTECTOMY;  Surgeon: Lafonda Mosses, MD;  Location: [facility];  Service: Gynecology;  Laterality: N/A;  . UPPER GASTROINTESTINAL ENDOSCOPY       OB History    Gravida  3   Para  3   Term      Preterm      AB      Living  2     SAB      IAB      Ectopic      Multiple      Live Births  2  Family History  Problem Relation Age of Onset  . Colon cancer Father 65  . Prostate cancer Father 69  . Colon cancer Paternal Aunt        dx. early 54s  . Ovarian cancer Paternal Grandmother 14  . Breast cancer Cousin   . Endometriosis Mother   . Cancer Paternal Uncle        unknown type, dx. early 70s  . Cancer Paternal Uncle 5       unknown type  . Esophageal cancer Neg Hx     Social History   Tobacco Use  . Smoking status: Never Smoker  . Smokeless tobacco: Never Used  Vaping Use  . Vaping Use: Never used  Substance Use Topics  . Alcohol use: Yes    Comment: Social drinker  . Drug use: No    Home Medications Prior  to Admission medications   Medication Sig Start Date End Date Taking? Authorizing Provider  acetaminophen (TYLENOL) 500 MG tablet Take 1,000 mg by mouth every 6 (six) hours as needed for moderate pain.     [provider]  famotidine (PEPCID) 20 MG tablet Take 1 tablet (20 mg total) by mouth 2 (two) times daily. 08/29/20   Heath Lark, MD  lidocaine-prilocaine (EMLA) cream Apply to affected area once 08/29/20   Heath Lark, MD  metFORMIN (GLUCOPHAGE) 500 MG tablet TAKE 1 TABLET BY MOUTH TWICE DAILY WITH MEALS 08/08/20   Heath Lark, MD  morphine (MSIR) 15 MG tablet Take 1 tablet (15 mg total) by mouth every 4 (four) hours as needed for severe pain. 09/14/20   Heath Lark, MD  Multiple Vitamin (MULTIVITAMIN WITH MINERALS) TABS tablet Take 1 tablet by mouth daily.    [provider]  pantoprazole (PROTONIX) 40 MG tablet Take 1 tablet (40 mg total) by mouth 2 (two) times daily. 08/29/20   Heath Lark, MD  sucralfate (CARAFATE) 1 g tablet Take 1 tablet (1 g total) by mouth 3 (three) times daily. 08/29/20   Heath Lark, MD  prochlorperazine (COMPAZINE) 10 MG tablet Take 1 tablet (10 mg total) by mouth every 6 (six) hours as needed (Nausea or vomiting). 09/14/20 09/19/20  Heath Lark, MD    Allergies    Patient has no known allergies.  Review of Systems   Review of Systems  Constitutional: Negative for chills and fever.  Respiratory: Negative for cough and shortness of breath.   Cardiovascular: Negative for chest pain.  Gastrointestinal: Positive for abdominal pain, nausea and vomiting. Negative for blood in stool and diarrhea.  Genitourinary: Negative for dysuria, flank pain and hematuria.  Musculoskeletal: Negative for back pain.  All other systems reviewed and are negative.   Physical Exam Updated Vital Signs BP 120/85 (BP Location: Left Arm)   Pulse 90   Temp 99 F (37.2 C) (Oral)   Resp 18   LMP 07/05/2019   SpO2 98%   Physical Exam Vitals and nursing note reviewed.   Constitutional:      General: She is not in acute distress.    Appearance: She is well-developed. She is not diaphoretic.  HENT:     Head: Normocephalic and atraumatic.     Mouth/Throat:     Mouth: Mucous membranes are moist.     Pharynx: Oropharynx is clear.  Eyes:     Conjunctiva/sclera: Conjunctivae normal.  Cardiovascular:     Rate and Rhythm: Normal rate and regular rhythm.     Pulses: Normal pulses.  Radial pulses are 2+ on the right side and 2+ on the left side.       Posterior tibial pulses are 2+ on the right side and 2+ on the left side.     Heart sounds: Normal heart sounds.     Comments: Tactile temperature in the extremities appropriate and equal bilaterally. Pulmonary:     Effort: Pulmonary effort is normal. No respiratory distress.     Breath sounds: Normal breath sounds.  Abdominal:     Palpations: Abdomen is soft.     Tenderness: There is abdominal tenderness. There is no right CVA tenderness, left CVA tenderness or guarding.    Musculoskeletal:     Cervical back: Neck supple.     Right lower leg: No edema.     Left lower leg: No edema.  Skin:    General: Skin is warm and dry.  Neurological:     Mental Status: She is alert.  Psychiatric:        Mood and Affect: Mood and affect normal.        Speech: Speech normal.        Behavior: Behavior normal.     ED Results / Procedures / Treatments   Labs (all labs ordered are listed, but only abnormal results are displayed) Labs Reviewed  COMPREHENSIVE METABOLIC PANEL - Abnormal; Notable for the following components:      Result Value   Glucose, Bld 121 (*)    ALT 48 (*)    All other components within normal limits  CBC - Abnormal; Notable for the following components:   RBC 3.79 (*)    Hemoglobin 10.9 (*)    HCT 33.6 (*)    All other components within normal limits  RESP PANEL BY RT-PCR (FLU A&B, COVID) ARPGX2  LIPASE, BLOOD  DIFFERENTIAL  URINALYSIS, ROUTINE W REFLEX MICROSCOPIC  I-STAT  BETA HCG BLOOD, ED ([facility], [facility], [facility] ONLY)    EKG None  Radiology CT ABDOMEN PELVIS W CONTRAST  Result Date: 09/19/2020 CLINICAL DATA:  Abdominal pain for 3 weeks. Ovarian cancer. Rule out bowel obstruction. EXAM: CT ABDOMEN AND PELVIS WITH CONTRAST TECHNIQUE: Multidetector CT imaging of the abdomen and pelvis was performed using the standard protocol following bolus administration of intravenous contrast. CONTRAST:  135mL OMNIPAQUE IOHEXOL 300 MG/ML  SOLN COMPARISON:  09/07/2020 FINDINGS: Lower chest: No acute abnormality. Hepatobiliary: No focal liver abnormality is seen. Status post cholecystectomy. No biliary dilatation. Pancreas: Unremarkable. No pancreatic ductal dilatation or surrounding inflammatory changes. Spleen: Normal in size without focal abnormality. Adrenals/Urinary Tract: Normal adrenal glands. No kidney mass or hydronephrosis identified. Urinary bladder is unremarkable. Stomach/Bowel: Stomach is within normal limits. Appendix appears normal. Similar appearance of soft tissue thickening along the serosal surface of small and large bowel loops consistent with peritoneal disease. Subjectively this is similar to the previous exam. Vascular/Lymphatic: Normal appearance of the abdominal aorta. No aneurysm. No abdominopelvic adenopathy identified. Reproductive: Status post hysterectomy. No adnexal masses. Other: There is no ascites. Soft tissue thickening is again noted along the peritoneal reflections within the abdomen and pelvis compatible with known peritoneal disease. The appearance is similar to the previous exam. Interloop soft tissue thickening with tethering of pelvic small bowel loops is difficult to quantify in compared with previous exam reflecting lack of enteric contrast material. Previously described soft tissue thickening along the serosal surface of the proximal transverse colon is similar to previous exam, image 45/5. Recently characterized increased soft tissue thickening along the  surface of the jejunal  bowel loops is difficult to visualize, also reflecting lack of IV contrast material. Musculoskeletal: No acute or significant osseous findings. IMPRESSION: 1. No acute findings identified within the abdomen or pelvis. No evidence for bowel obstruction. 2. Similar appearance of soft tissue thickening along the peritoneal reflections within the abdomen and pelvis compatible with known peritoneal disease. Recently characterized increased soft tissue along the surface of the jejunal bowel loops and tethering of pelvic small bowel loops is difficult to quantify (and compared with previous exam) due to lack of IV contrast material. Electronically Signed   By: Kerby Moors M.D.   On: 09/19/2020 14:15    Procedures Procedures   Medications Ordered in ED Medications  0.9 %  sodium chloride infusion ( Intravenous New Bag/Given 09/19/20 1527)  morphine 2 MG/ML injection 2-4 mg (4 mg Intravenous Given 09/19/20 1533)  ondansetron (ZOFRAN) injection 4 mg (4 mg Intravenous Given 09/19/20 1215)  lactated ringers bolus 1,000 mL (0 mLs Intravenous Stopped 09/19/20 1401)  promethazine (PHENERGAN) injection 12.5 mg (12.5 mg Intravenous Given 09/19/20 1346)  iohexol (OMNIPAQUE) 300 MG/ML solution 100 mL (100 mLs Intravenous Contrast Given 09/19/20 1318)    ED Course  I have reviewed the triage vital signs and the nursing notes.  Pertinent labs & imaging results that were available during my care of the patient were reviewed by me and considered in my medical decision making (see chart for details).  Clinical Course as of 09/19/20 1557  Wed Sep 19, 2020  1510 Spoke with Dr. Alvy Bimler, oncologist.  States she reviewed the CT scan and is concerned about the aggressiveness of the patient's intra-abdominal disease and she will need admission. Recommends patient be admitted via hospitalist to oncology floor for initiation of inpatient chemotherapy. [SJ]  58 Spoke with Dr. Marylyn Ishihara, hospitalist. Agrees  to admit the patient.  Requests we order an in-house Covid test. [SJ]    Clinical Course User Index [SJ] Loris Winrow, Helane Gunther, PA-C   MDM Rules/Calculators/A&P                          Patient presents with abdominal pain, nausea, vomiting. Patient is nontoxic appearing, afebrile, not tachycardic, not tachypneic, not hypotensive, maintains excellent SPO2 on room air.   I have reviewed the patient's chart to obtain more information.   I reviewed and interpreted the patient's labs and radiological studies.  Concerned about new neoplastic disease within the abdomen.  Reportedly, cancer center policy would prevent a Covid positive patient from receiving chemotherapy.  Oncologist concerned about progression of the patient's disease.  Admission for persistent vomiting, pain, and chemotherapy initiation.   Findings and plan of care discussed with attending physician, Varney Biles, MD.   Vitals:   09/19/20 1230 09/19/20 1330 09/19/20 1400 09/19/20 1520  BP: 116/80 130/81 (!) 126/92 (!) 132/92  Pulse: 84 80 82 79  Resp: 18 16 18 19   Temp:      TempSrc:      SpO2: 98% 99% 100% 100%     Final Clinical Impression(s) / ED Diagnoses Final diagnoses:  Intractable vomiting with nausea, unspecified vomiting type    Rx / DC Orders ED Discharge Orders    None       Layla Maw 09/19/20 1603    Varney Biles, MD 09/19/20 1624

## 2020-09-19 NOTE — H&P (Addendum)
History and Physical    Sarah Newman QMG:[id number] DOB: 22-Nov-1975 DOA: 09/19/2020  PCP: Heath Lark, MD  Patient coming from: Cancer [place]  Chief Complaint: Abdominal pain.   HPI: Sarah Newman is a 45 y.o. female with medical history significant of Hx of ovarian CA s/p chemo, PUD, steroid induced DM. Presenting with 3 weeks of N/V/abdominal pain. She is being followed by Dr. Alvy Bimler for Hx of ovarian cancer. She is s/p chemotherapy. Her pain has been in the LUQ. It's crampy w/o radiation. She has had non-bloody emesis that has not really responded to zofran. She is only able to keep liquids and crackers down at this point. Her oncologist ordered a CT scan on 09/07/20 that showed peritoneal disease and it was decided that she would start chemotherapy at the end of this month after completion of her COVID treatment (received MAB). However, her symptoms have worsened. She presented to her oncologist today. Her oncologist was concerned for rapidly progressing disease and recommended that she come to the ED. She denies any other aggravating or alleviating factors.   ED Course: EDP spoke with Onco. Concern for abdominal carcinomatosis. Requested admission for immediate chemotherapy. TRH called for admission.    Review of Systems: Review of systems is otherwise negative for all not mentioned in HPI.   PMHx Past Medical History:  Diagnosis Date  . Anemia   . Asthma   . Family history of breast cancer   . Family history of colon cancer   . Family history of ovarian cancer   . Family history of prostate cancer   . Pelvic mass   . Ulcer, stomach peptic     PSHx Past Surgical History:  Procedure Laterality Date  . CHOLECYSTECTOMY N/A 02/16/2015   Procedure: LAPAROSCOPIC CHOLECYSTECTOMY WITH INTRAOPERATIVE CHOLANGIOGRAM;  Surgeon: Johnathan Hausen, MD;  Location: [facility];  Service: General;  Laterality: N/A;  . IR IMAGING GUIDED PORT INSERTION  08/12/2019  . SALPINGOOPHORECTOMY N/A 07/21/2019    Procedure: EXPLORATORY LAPAROTOMY, TOTAL ABDOMINAL HYSTERECTOMY, BILATERAL SALPINGO OOPHORECTOMY, OMENTECTOMY;  Surgeon: Lafonda Mosses, MD;  Location: [facility];  Service: Gynecology;  Laterality: N/A;  . UPPER GASTROINTESTINAL ENDOSCOPY      SocHx  reports that she has never smoked. She has never used smokeless tobacco. She reports current alcohol use. She reports that she does not use drugs.  No Known Allergies  FamHx Family History  Problem Relation Age of Onset  . Colon cancer Father 51  . Prostate cancer Father 71  . Colon cancer Paternal Aunt        dx. early 36s  . Ovarian cancer Paternal Grandmother 31  . Breast cancer Cousin   . Endometriosis Mother   . Cancer Paternal Uncle        unknown type, dx. early 70s  . Cancer Paternal Uncle 32       unknown type  . Esophageal cancer Neg Hx     Prior to Admission medications   Medication Sig Start Date End Date Taking? Authorizing Provider  acetaminophen (TYLENOL) 500 MG tablet Take 1,000 mg by mouth every 6 (six) hours as needed for moderate pain.     [provider]  famotidine (PEPCID) 20 MG tablet Take 1 tablet (20 mg total) by mouth 2 (two) times daily. 08/29/20   Heath Lark, MD  lidocaine-prilocaine (EMLA) cream Apply to affected area once 08/29/20   Heath Lark, MD  metFORMIN (GLUCOPHAGE) 500 MG tablet TAKE 1 TABLET BY MOUTH TWICE DAILY WITH MEALS 08/08/20  Heath Lark, MD  morphine (MSIR) 15 MG tablet Take 1 tablet (15 mg total) by mouth every 4 (four) hours as needed for severe pain. 09/14/20   Heath Lark, MD  Multiple Vitamin (MULTIVITAMIN WITH MINERALS) TABS tablet Take 1 tablet by mouth daily.    [provider]  pantoprazole (PROTONIX) 40 MG tablet Take 1 tablet (40 mg total) by mouth 2 (two) times daily. 08/29/20   Heath Lark, MD  sucralfate (CARAFATE) 1 g tablet Take 1 tablet (1 g total) by mouth 3 (three) times daily. 08/29/20   Heath Lark, MD  prochlorperazine (COMPAZINE) 10 MG tablet Take 1  tablet (10 mg total) by mouth every 6 (six) hours as needed (Nausea or vomiting). 09/14/20 09/19/20  Heath Lark, MD    Physical Exam: Vitals:   09/19/20 1230 09/19/20 1330 09/19/20 1400 09/19/20 1520  BP: 116/80 130/81 (!) 126/92 (!) 132/92  Pulse: 84 80 82 79  Resp: 18 16 18 19   Temp:      TempSrc:      SpO2: 98% 99% 100% 100%    General: 45 y.o. female resting in bed in NAD Eyes: PERRL, normal sclera ENMT: Nares patent w/o discharge, orophaynx clear, dentition normal, ears w/o discharge/lesions/ulcers Neck: Supple, trachea midline Cardiovascular: RRR, +S1, S2, no m/g/r, equal pulses throughout Respiratory: CTABL, no w/r/r, normal WOB GI: BS+, ND, LUQ TTP, no masses noted, no organomegaly noted MSK: No e/c/c Skin: No rashes, bruises, ulcerations noted Neuro: A&O x 3, no focal deficits Psyc: Appropriate interaction and affect, calm/cooperative  Labs on Admission: I have personally reviewed following labs and imaging studies  CBC: Recent Labs  Lab 09/19/20 1118  WBC 6.5  NEUTROABS 4.3  HGB 10.9*  HCT 33.6*  MCV 88.7  PLT 916   Basic Metabolic Panel: Recent Labs  Lab 09/19/20 1118  NA 140  K 3.6  CL 102  CO2 27  GLUCOSE 121*  BUN 8  CREATININE 0.66  CALCIUM 9.6   GFR: CrCl cannot be calculated (Unknown ideal weight.). Liver Function Tests: Recent Labs  Lab 09/19/20 1118  AST 39  ALT 48*  ALKPHOS 101  BILITOT 0.6  PROT 7.9  ALBUMIN 4.1   Recent Labs  Lab 09/19/20 1118  LIPASE 36   No results for input(s): AMMONIA in the last 168 hours. Coagulation Profile: No results for input(s): INR, PROTIME in the last 168 hours. Cardiac Enzymes: No results for input(s): CKTOTAL, CKMB, CKMBINDEX, TROPONINI in the last 168 hours. BNP (last 3 results) No results for input(s): PROBNP in the last 8760 hours. HbA1C: No results for input(s): HGBA1C in the last 72 hours. CBG: No results for input(s): GLUCAP in the last 168 hours. Lipid Profile: No results for  input(s): CHOL, HDL, LDLCALC, TRIG, CHOLHDL, LDLDIRECT in the last 72 hours. Thyroid Function Tests: No results for input(s): TSH, T4TOTAL, FREET4, T3FREE, THYROIDAB in the last 72 hours. Anemia Panel: No results for input(s): VITAMINB12, FOLATE, FERRITIN, TIBC, IRON, RETICCTPCT in the last 72 hours. Urine analysis:    Component Value Date/Time   COLORURINE AMBER (A) 01/19/2020 0800   APPEARANCEUR CLEAR 01/19/2020 0800   LABSPEC 1.029 01/19/2020 0800   PHURINE 5.0 01/19/2020 0800   GLUCOSEU NEGATIVE 01/19/2020 0800   HGBUR NEGATIVE 01/19/2020 0800   BILIRUBINUR NEGATIVE 01/19/2020 0800   KETONESUR 5 (A) 01/19/2020 0800   PROTEINUR 30 (A) 01/19/2020 0800   UROBILINOGEN 0.2 02/15/2015 2140   NITRITE NEGATIVE 01/19/2020 0800   LEUKOCYTESUR NEGATIVE 01/19/2020 0800  Radiological Exams on Admission: CT ABDOMEN PELVIS W CONTRAST  Result Date: 09/19/2020 CLINICAL DATA:  Abdominal pain for 3 weeks. Ovarian cancer. Rule out bowel obstruction. EXAM: CT ABDOMEN AND PELVIS WITH CONTRAST TECHNIQUE: Multidetector CT imaging of the abdomen and pelvis was performed using the standard protocol following bolus administration of intravenous contrast. CONTRAST:  126mL OMNIPAQUE IOHEXOL 300 MG/ML  SOLN COMPARISON:  09/07/2020 FINDINGS: Lower chest: No acute abnormality. Hepatobiliary: No focal liver abnormality is seen. Status post cholecystectomy. No biliary dilatation. Pancreas: Unremarkable. No pancreatic ductal dilatation or surrounding inflammatory changes. Spleen: Normal in size without focal abnormality. Adrenals/Urinary Tract: Normal adrenal glands. No kidney mass or hydronephrosis identified. Urinary bladder is unremarkable. Stomach/Bowel: Stomach is within normal limits. Appendix appears normal. Similar appearance of soft tissue thickening along the serosal surface of small and large bowel loops consistent with peritoneal disease. Subjectively this is similar to the previous exam. Vascular/Lymphatic:  Normal appearance of the abdominal aorta. No aneurysm. No abdominopelvic adenopathy identified. Reproductive: Status post hysterectomy. No adnexal masses. Other: There is no ascites. Soft tissue thickening is again noted along the peritoneal reflections within the abdomen and pelvis compatible with known peritoneal disease. The appearance is similar to the previous exam. Interloop soft tissue thickening with tethering of pelvic small bowel loops is difficult to quantify in compared with previous exam reflecting lack of enteric contrast material. Previously described soft tissue thickening along the serosal surface of the proximal transverse colon is similar to previous exam, image 45/5. Recently characterized increased soft tissue thickening along the surface of the jejunal bowel loops is difficult to visualize, also reflecting lack of IV contrast material. Musculoskeletal: No acute or significant osseous findings. IMPRESSION: 1. No acute findings identified within the abdomen or pelvis. No evidence for bowel obstruction. 2. Similar appearance of soft tissue thickening along the peritoneal reflections within the abdomen and pelvis compatible with known peritoneal disease. Recently characterized increased soft tissue along the surface of the jejunal bowel loops and tethering of pelvic small bowel loops is difficult to quantify (and compared with previous exam) due to lack of IV contrast material. Electronically Signed   By: Kerby Moors M.D.   On: 09/19/2020 14:15    Assessment/Plan Abdominal pain N/V Hx of ovarian CA Abdominal carcinomatosis     - admit to inpatient, med-surg     - chemotherapy to start per onco     - she can have CLD for now     - continue fluids, pain meds, anti-emetics  Recent COVID positive     - positive 09/08/20     - s/p MAB     - no symptoms, follow; she is still in the isolation period  Normocytic anemia     - no evidence of bleed, follow  Steroid-induced DM     - on  metformin     - last A1c 05/29/20 was 6.5     - glucose checks, A1c for now  DVT prophylaxis: lovenox  Code Status: FULL  Family Communication: None at bedside.  Consults called: Onco (Dr. Alvy Bimler) aware.  Status is: Inpatient  Remains inpatient appropriate because:Inpatient level of care appropriate due to severity of illness   Dispo: The patient is from: Home              Anticipated d/c is to: Home              Anticipated d/c date is: 3 days  Patient currently is not medically stable to d/c.   Difficult to place patient No  Jonnie Finner DO [facility]  If 7PM-7AM, please contact night-coverage [email or web address]  09/19/2020, 4:24 PM

## 2020-09-19 NOTE — ED Notes (Signed)
ED TO INPATIENT HANDOFF REPORT  Name/Age/Gender Sarah Newman 45 y.o. female  Code Status    Code Status Orders  (From admission, onward)         Start     Ordered   09/19/20 1836  Full code  Continuous        09/19/20 1835        Code Status History    Date Active Date Inactive Code Status Order ID Comments User Context   07/21/2019 0835 07/25/2019 2138 Full Code [id number]  [name], NP Inpatient   02/16/2015 1348 02/17/2015 1628 Full Code [id number]  Johnathan Hausen, MD Inpatient   Advance Care Planning Activity      Home/SNF/Other Home  Chief Complaint Abdominal pain [R10.9]  Level of Care/Admitting Diagnosis ED Disposition    ED Disposition Condition [place]: [facility] [[id number]]  Level of Care: Med-Surg [16]  May admit patient to [facility] or [facility] if equivalent level of care is available:: No  Covid Evaluation: Confirmed COVID Positive  Diagnosis: Abdominal pain [884166]  Admitting Physician: Jonnie Finner [[id number]]  Attending Physician: Jonnie Finner [[id number]]  Estimated length of stay: past midnight tomorrow  Certification:: I certify this patient will need inpatient services for at least 2 midnights       Medical History Past Medical History:  Diagnosis Date  . Anemia   . Asthma   . Family history of breast cancer   . Family history of colon cancer   . Family history of ovarian cancer   . Family history of prostate cancer   . Pelvic mass   . Ulcer, stomach peptic     Allergies No Known Allergies  IV Location/Drains/Wounds Patient Lines/Drains/Airways Status    Active Line/Drains/Airways    Name Placement date Placement time Site Days   Implanted Port 08/12/19 Right Chest 08/12/19  1208  Chest  404   Incision (Closed) 02/16/15 Abdomen 02/16/15  1219  -- 2042   Incision (Closed) 07/21/19 Abdomen 07/21/19  1028  -- 426   Incision - 4 Ports Abdomen Umbilicus Right;Medial;Lateral  Right;Lateral;Upper Mid;Upper 02/16/15  1050  -- 2042          Labs/Imaging Results for orders placed or performed during the hospital encounter of 09/19/20 (from the past 48 hour(s))  Lipase, blood     Status: None   Collection Time: 09/19/20 11:18 AM  Result Value Ref Range   Lipase 36 11 - 51 U/L    Comment: Performed at [facility], [place] [address] [place], [place] [address]  Comprehensive metabolic panel     Status: Abnormal   Collection Time: 09/19/20 11:18 AM  Result Value Ref Range   Sodium 140 135 - 145 mmol/L   Potassium 3.6 3.5 - 5.1 mmol/L   Chloride 102 98 - 111 mmol/L   CO2 27 22 - 32 mmol/L   Glucose, Bld 121 (H) 70 - 99 mg/dL    Comment: Glucose reference range applies only to samples taken after fasting for at least 8 hours.   BUN 8 6 - 20 mg/dL   Creatinine, Ser 0.66 0.44 - 1.00 mg/dL   Calcium 9.6 8.9 - 10.3 mg/dL   Total Protein 7.9 6.5 - 8.1 g/dL   Albumin 4.1 3.5 - 5.0 g/dL   AST 39 15 - 41 U/L   ALT 48 (H) 0 - 44 U/L   Alkaline Phosphatase 101 38 - 126 U/L  Total Bilirubin 0.6 0.3 - 1.2 mg/dL   GFR, Estimated >60 >60 mL/min    Comment: (NOTE) Calculated using the CKD-EPI Creatinine Equation (2021)    Anion gap 11 5 - 15    Comment: Performed at [facility], [place] [address] [place], [place] [address]  CBC     Status: Abnormal   Collection Time: 09/19/20 11:18 AM  Result Value Ref Range   WBC 6.5 4.0 - 10.5 K/uL   RBC 3.79 (L) 3.87 - 5.11 MIL/uL   Hemoglobin 10.9 (L) 12.0 - 15.0 g/dL   HCT 33.6 (L) 36.0 - 46.0 %   MCV 88.7 80.0 - 100.0 fL   MCH 28.8 26.0 - 34.0 pg   MCHC 32.4 30.0 - 36.0 g/dL   RDW 11.9 11.5 - 15.5 %   Platelets 342 150 - 400 K/uL   nRBC 0.0 0.0 - 0.2 %    Comment: Performed at [facility], [place] [address] [place], [place] [address]  Differential     Status: None   Collection Time: 09/19/20 11:18 AM  Result Value Ref Range   Neutrophils Relative % 68 %   Neutro Abs  4.3 1.7 - 7.7 K/uL   Lymphocytes Relative 23 %   Lymphs Abs 1.4 0.7 - 4.0 K/uL   Monocytes Relative 7 %   Monocytes Absolute 0.4 0.1 - 1.0 K/uL   Eosinophils Relative 2 %   Eosinophils Absolute 0.1 0.0 - 0.5 K/uL   Basophils Relative 0 %   Basophils Absolute 0.0 0.0 - 0.1 K/uL   Immature Granulocytes 0 %   Abs Immature Granulocytes 0.01 0.00 - 0.07 K/uL    Comment: Performed at [facility], [place] [address], [place], [place] [address]  I-Stat beta hCG blood, ED     Status: None   Collection Time: 09/19/20 12:07 PM  Result Value Ref Range   I-stat hCG, quantitative <5.0 <5 mIU/mL   Comment 3            Comment:   GEST. AGE      CONC.  (mIU/mL)   <=1 WEEK        5 - 50     2 WEEKS       50 - 500     3 WEEKS       100 - 10,000     4 WEEKS     1,000 - 30,000        FEMALE AND NON-PREGNANT FEMALE:     LESS THAN 5 mIU/mL   Resp Panel by RT-PCR (Flu A&B, Covid) Nasopharyngeal Swab     Status: Abnormal   Collection Time: 09/19/20  3:55 PM   Specimen: Nasopharyngeal Swab; Nasopharyngeal(NP) swabs in vial transport medium  Result Value Ref Range   SARS Coronavirus 2 by RT PCR POSITIVE (A) NEGATIVE    Comment: RESULT CALLED TO, READ BACK BY AND VERIFIED WITH: Narcissus Detwiler @ 1939 ON 09/19/20 C VARNER (NOTE) SARS-CoV-2 target nucleic acids are DETECTED.  The SARS-CoV-2 RNA is generally detectable in upper respiratory specimens during the acute phase of infection. Positive results are indicative of the presence of the identified virus, but do not rule out bacterial infection or co-infection with other pathogens not detected by the test. Clinical correlation with patient history and other diagnostic information is necessary to determine patient infection status. The expected result is Negative.  Fact Sheet for Patients: EntrepreneurPulse.com.au  Fact Sheet for Healthcare Providers: IncredibleEmployment.be  This test  is not yet  approved or cleared by the [place] and  has been authorized for detection and/or diagnosis of SARS-CoV-2 by FDA under an Emergency Use Authorization (EUA).  This EUA will remain in effect (meaning this  test can be used) for the duration of  the COVID-19 declaration under Section 564(b)(1) of the Act, 21 U.S.C. section 360bbb-3(b)(1), unless the authorization is terminated or revoked sooner.     Influenza A by PCR NEGATIVE NEGATIVE   Influenza B by PCR NEGATIVE NEGATIVE    Comment: (NOTE) The Xpert Xpress SARS-CoV-2/FLU/RSV plus assay is intended as an aid in the diagnosis of influenza from Nasopharyngeal swab specimens and should not be used as a sole basis for treatment. Nasal washings and aspirates are unacceptable for Xpert Xpress SARS-CoV-2/FLU/RSV testing.  Fact Sheet for Patients: EntrepreneurPulse.com.au  Fact Sheet for Healthcare Providers: IncredibleEmployment.be  This test is not yet approved or cleared by the [place] FDA and has been authorized for detection and/or diagnosis of SARS-CoV-2 by FDA under an Emergency Use Authorization (EUA). This EUA will remain in effect (meaning this test can be used) for the duration of the COVID-19 declaration under Section 564(b)(1) of the Act, 21 U.S.C. section 360bbb-3(b)(1), unless the authorization is terminated or revoked.  Performed at [facility], [place] [address] [place], [place] [address]   Urinalysis, Routine w reflex microscopic     Status: Abnormal   Collection Time: 09/19/20  5:10 PM  Result Value Ref Range   Color, Urine YELLOW YELLOW   APPearance CLEAR CLEAR   Specific Gravity, Urine >1.046 (H) 1.005 - 1.030   pH 6.0 5.0 - 8.0   Glucose, UA NEGATIVE NEGATIVE mg/dL   Hgb urine dipstick NEGATIVE NEGATIVE   Bilirubin Urine NEGATIVE NEGATIVE   Ketones, ur NEGATIVE NEGATIVE mg/dL   Protein, ur NEGATIVE NEGATIVE mg/dL   Nitrite NEGATIVE NEGATIVE    Leukocytes,Ua NEGATIVE NEGATIVE    Comment: Performed at [facility] [address], [place],  [address]   CT ABDOMEN PELVIS W CONTRAST  Result Date: 09/19/2020 CLINICAL DATA:  Abdominal pain for 3 weeks. Ovarian cancer. Rule out bowel obstruction. EXAM: CT ABDOMEN AND PELVIS WITH CONTRAST TECHNIQUE: Multidetector CT imaging of the abdomen and pelvis was performed using the standard protocol following bolus administration of intravenous contrast. CONTRAST:  131mL OMNIPAQUE IOHEXOL 300 MG/ML  SOLN COMPARISON:  09/07/2020 FINDINGS: Lower chest: No acute abnormality. Hepatobiliary: No focal liver abnormality is seen. Status post cholecystectomy. No biliary dilatation. Pancreas: Unremarkable. No pancreatic ductal dilatation or surrounding inflammatory changes. Spleen: Normal in size without focal abnormality. Adrenals/Urinary Tract: Normal adrenal glands. No kidney mass or hydronephrosis identified. Urinary bladder is unremarkable. Stomach/Bowel: Stomach is within normal limits. Appendix appears normal. Similar appearance of soft tissue thickening along the serosal surface of small and large bowel loops consistent with peritoneal disease. Subjectively this is similar to the previous exam. Vascular/Lymphatic: Normal appearance of the abdominal aorta. No aneurysm. No abdominopelvic adenopathy identified. Reproductive: Status post hysterectomy. No adnexal masses. Other: There is no ascites. Soft tissue thickening is again noted along the peritoneal reflections within the abdomen and pelvis compatible with known peritoneal disease. The appearance is similar to the previous exam. Interloop soft tissue thickening with tethering of pelvic small bowel loops is difficult to quantify in compared with previous exam reflecting lack of enteric contrast material. Previously described soft tissue thickening along the serosal surface of the proximal transverse colon is similar to previous exam, image  45/5. Recently characterized increased soft tissue thickening along the surface of the jejunal bowel loops is difficult to visualize, also reflecting lack of IV contrast material. Musculoskeletal: No acute or significant osseous findings. IMPRESSION: 1. No acute findings identified within the abdomen or pelvis. No evidence for bowel obstruction. 2. Similar appearance of soft tissue thickening along the peritoneal reflections within the abdomen and pelvis compatible with known peritoneal disease. Recently characterized increased soft tissue along the surface of the jejunal bowel loops and tethering of pelvic small bowel loops is difficult to quantify (and compared with previous exam) due to lack of IV contrast material. Electronically Signed   By: Kerby Moors M.D.   On: 09/19/2020 14:15    Pending Labs Unresulted Labs (From admission, onward)          Start     Ordered   09/26/20 0500  Creatinine, serum  (enoxaparin (LOVENOX)    CrCl >/= 30 ml/min)  Weekly,   R     Comments: while on enoxaparin therapy    09/19/20 1835   09/20/20 0500  Comprehensive metabolic panel  Tomorrow morning,   R        09/19/20 1835   09/20/20 0500  CBC  Tomorrow morning,   R        09/19/20 1835   09/19/20 1836  HIV Antibody (routine testing w rflx)  (HIV Antibody (Routine testing w reflex) panel)  Once,   STAT        09/19/20 1835   09/19/20 1836  Hemoglobin A1c  Once,   STAT        09/19/20 1835          Vitals/Pain Today's Vitals   09/19/20 1700 09/19/20 1711 09/19/20 1730 09/19/20 1800  BP: (!) 125/99  (!) 132/92 (!) 124/100  Pulse: 66  82 72  Resp: 16   16  Temp:      TempSrc:      SpO2: 100%  98% 98%  PainSc:  7       Isolation Precautions Airborne and Contact precautions  Medications Medications  0.9 %  sodium chloride infusion ( Intravenous Stopped 09/19/20 1956)  morphine 2 MG/ML injection 2-4 mg (4 mg Intravenous Given 09/19/20 1957)  0.9 %  sodium chloride infusion ( Intravenous New  Bag/Given 09/19/20 1958)  acetaminophen (TYLENOL) tablet 650 mg (has no administration in time range)    Or  acetaminophen (TYLENOL) suppository 650 mg (has no administration in time range)  ondansetron (ZOFRAN) tablet 4 mg (has no administration in time range)    Or  ondansetron (ZOFRAN) injection 4 mg (has no administration in time range)  ondansetron (ZOFRAN) injection 4 mg (4 mg Intravenous Given 09/19/20 1215)  lactated ringers bolus 1,000 mL (0 mLs Intravenous Stopped 09/19/20 1401)  promethazine (PHENERGAN) injection 12.5 mg (12.5 mg Intravenous Given 09/19/20 1346)  iohexol (OMNIPAQUE) 300 MG/ML solution 100 mL (100 mLs Intravenous Contrast Given 09/19/20 1318)    Mobility walks

## 2020-09-19 NOTE — Telephone Encounter (Signed)
Called regarding mychart message. She is complaining of vomiting x2 a day for 3 weeks. She is able to eat small amounts. She thought that it was from having covid. She is taking Zofran Rx as prescribed. She is taking the Carafate as prescribed. She was constipated yesterday from the Morphine Rx. She took Miralax and senokot yesterday and had a bm yesterday. She stopped the morphine. Before vomiting she has a cramping pain in her abdomen.  Instructed to go to the ER to be evaluated now. Unable to come into [facility] due to recent positive covid tested. She verbalized understanding and will go to the ER.

## 2020-09-19 NOTE — ED Triage Notes (Signed)
Pt presents with c/o abdominal pain for 3 weeks. Pt reports her oncologists is concerned she may have a blockage, is an ovarian cancer pt, has not started chemo yet as she was Covid positive on 2/5 and has to wait 21 days from that positive test. Pt reports vomiting for 3 weeks.

## 2020-09-19 NOTE — Progress Notes (Signed)
DISCONTINUE ON PATHWAY REGIMEN - Ovarian     A cycle is every 28 days:     Carboplatin      Liposomal doxorubicin   **Always confirm dose/schedule in your pharmacy ordering system**  REASON: Other Reason PRIOR TREATMENT: [id number]: Liposomal Doxorubicin (Doxil) 30 mg/m2 + Carboplatin AUC=5 q28 Days; Stop Treatment After 6 Cycles If Complete Response, Otherwise Continue Treatment Until Progression or Unacceptable Toxicity TREATMENT RESPONSE: Unable to Evaluate  START OFF PATHWAY REGIMEN - Ovarian   [id number]:Carboplatin AUC=4 D1 + Gemcitabine 800 mg/m2 D1, 8 q21 Days:   A cycle is every 21 days:     Gemcitabine      Carboplatin   **Always confirm dose/schedule in your pharmacy ordering system**  Patient Characteristics: Recurrent or Progressive Disease, Second Line, Platinum Sensitive and ? 6 Months Since Last Therapy, Not a Candidate for Secondary Debulking Surgery BRCA Mutation Status: Absent Therapeutic Status: Recurrent or Progressive Disease Line of Therapy: Second Line  Intent of Therapy: Non-Curative / Palliative Intent, Discussed with Patient

## 2020-09-19 NOTE — Progress Notes (Signed)
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 The patient is in life-threatening risk of bowel obstruction Unfortunately, she cannot wait for outpatient treatment on February 28 She needs urgent admission and chemotherapy as soon as possible I recommend admission to [facility] oncology floor whenever a bed is available I will coordinate care with inpatient team to get her started on chemotherapy tomorrow if she gets a bit upstairs Previously, I have reviewed the plan of care and recommend carboplatin with Doxil Given the urgency of situation, I plan to change her treatment to carboplatin with gemcitabine The risk benefits, side effects of treatment were discussed with the patient and she is in agreement to proceed  Abdominal carcinomatosis with abdominal pain, nausea and inability to tolerate oral intake Thankfully, CT imaging did not show evidence of bowel obstruction I will start her on IV fluids, IV pain medicine as needed and IV antiemetics as needed She can continue clear liquids  Code Status Full code  Goals of care Resolution of bowel obstructive symptoms  Discharge planning Likely next week  All questions were answered. The patient knows to call the clinic with any problems, questions or concerns. Reviewed plan of care with ER physician Heath Lark, MD 09/19/2020 3:27 PM  Subjective:  The patient is well-known to me She is directed to the emergency department due to progressive abdominal pain, uncontrolled nausea and inability to tolerate oral intake CT imaging show disease progression She is still having some abdominal pain and mild nausea Oncology History Overview Note  Clear cell features Negative genetics   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[place])  07/02/2019 Imaging   Ct scan of abdomen and pelvis 1. There is a 16 cm complex cystic mass in the pelvis containing thickened  septations located near midline, abutting the superior aspect of the uterus. I suspect this mass is adnexal/ovarian in origin rather than uterine in origin. Specifically, I suspect the mass likely arises from the left ovary/adnexa and is most consistent with a neoplasm. Malignancy is certainly not excluded on this study. Recommend a pelvic ultrasound for further evaluation. 2. The rounded more solid-appearing structure in the right side of the pelvis is probably the right ovary. Recommend attention on ultrasound. 3. The small amount of fluid in the pelvis is likely reactive to the complex cystic mass likely rising from the left ovary/adnexa. 4. No cause for blood in vomit or black stools identified. No convincing evidence of a perforated or inflamed gastric or duodenal ulcer. 5. Hepatic steatosis.   07/02/2019 Imaging   MRI pelvis 1. 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 There are 2 enhancing nodules within the central upper abdomen raising the possibility of peritoneal carcinomatosis. Additionally, there is a small amount fluid within the pelvis. Malignant ascites not excluded. 2. Fibroid uterus. 3. Hepatic steatosis.   07/02/2019 Imaging   US pelvis 1. There is a large mass in the pelvis also seen on CT imaging. A separate left ovary is not visualized. 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 The mass is suspicious for malignancy. Recommend gynecologic consultation. 2. The right ovary demonstrates an irregular ill-defined hypoechoic hypervascular region centrally. The findings are nonspecific in the right ovary. However, given the apparent left ovarian neoplasm suspicious for malignancy, recommend an MRI to further assess  the right ovary. 3. Uterine fibroid measuring 3.6 cm.     07/21/2019 Pathology Results   FINAL MICROSCOPIC DIAGNOSIS: A. 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 - See oncology table. B. OMENTUM, RESECTION: - Omental lymph node with metastatic carcinoma (1/1). - Omental adipose tissue with foci of inflammation and reactive mesothelial changes. C. FALLOPIAN TUBE, LEFT, RESECTION: - Fallopian tube with focal, mild inflammation and fibrosis. - No tumor identified. D. UTERUS, CERVIX, RIGHT TUBE AND OVARY: - Cervix Nabothian cyst and squamous metaplasia. - Endometrium Proliferative. No hyperplasia or carcinoma. - Myometrium Leiomyomata. No malignancy. -Right ovary Poorly differentiated carcinoma, 4.8 cm. See oncology table and comment. -Right Fallopian tube Benign paratubal cyst. No endometriosis or malignancy. ONCOLOGY TABLE: OVARY or FALLOPIAN TUBE or PRIMARY PERITONEUM: Procedure: Bilateral f-oophorectomy, hysterectomy and omentectomy. Specimen Integrity: Intact Tumor Site: Left ovary and right ovary. See comment. Ovarian Surface Involvement: Not identified. Fallopian Tube Surface Involvement: Not identified. Tumor Size: Left ovary: 18.6 x 16.0 x 7.2 cm. Right ovary: 4.8 x 3.2 x 3.2 cm. Histologic Type: Clear cell carcinoma. See comment. Histologic Grade: High-grade. Other Tissue/ Organ Involvement: Omental lymph node with metastatic carcinoma. Peritoneal/Ascitic Fluid: Pending. Treatment Effect: No known presurgical therapy. Pathologic Stage Classification (pTNM, AJCC 8th Edition): pT3a, pN1a. Representative Tumor Block: A2, A3, A4, A5 A6, A7 and A8. Comment(s): The left ovarian tumor is 18.6 cm in greatest dimension and has features of clear cell carcinoma. The right ovarian tumor is 4.8 cm in greatest dimension and is poorly differentiated with focal features consistent with clear cell carcinoma. The pattern of involvement suggests that the right ovarian tumor is likely metastatic from the larger left ovarian clear cell carcinoma. Sections of the omentum show a lymph  node with metastatic carcinoma. Sections of the remainder of the omentum do not show involvement by carcinoma.   07/21/2019 Surgery   Procedure(s) Performed: Exploratory laparotomy with radical tumor debulking including total hysterectomy, bilateral salpingo-oophorectomy, infra-gastric omentectomy, and washings.   Surgeon: Jeral Pinch, MD    Operative Findings: On EUA, mobile uterus and ovarian mass, situated out of the pelvis, spanning 6-8 cm above the umbilicus.  On intra-abdominal entry, inflamed omentum adherent to much of the 16 cm left ovarian mass, adhesions easily lysed bluntly.  No nodularity of the ovarian mass however on delivery of the mass through the midline incision, there was Intra-Op rupture of one of the smaller cystic components with drainage of clear brown-tinged fluid.  Grossly normal-appearing left fallopian tube as well as right tube.  Right ovary mildly enlarged measuring approximately 4 cm and firm/nodular, suspicious for tumor involvement.  Uterus 8-10 cm with 4 cm anterior mid body fibroid.  Some very small volume miliary disease along right pelvic sidewall, removed with uterine specimen.  Evidence of small diverticular disease.  Small bowel normal appearing although multiple loops of bowel adherent to each other, especially by mesentery, and an inflammatory manner.  An approximately 2 x 2 centimeter nodule suspicious for tumor implant was found in the omentum just inferior to the greater curvature of the stomach.  Additional small (less than 5 mm) implants noted on the stomach and posterior aspect of the left lobe of the liver.  Otherwise the liver surface and diaphragm were smooth.   08/03/2019 Cancer Staging   Staging form: Ovary, Fallopian Tube, and Primary Peritoneal Carcinoma, AJCC 8th Edition - Pathologic: Stage IIIA2 (pT3a, pN1, cM0) - Signed by Heath Lark, MD on 08/03/2019   08/12/2019 Procedure  Placement of single lumen port a cath via right internal jugular  vein. The catheter tip lies at the cavo-atrial junction. A power injectable port a cath was placed and is ready for immediate use.   08/22/2019 -  Chemotherapy   The patient had carboplatin and taxol for chemotherapy treatment.     09/23/2019 Genetic Testing   Negative genetic testing:  No pathogenic variants detected on the [facility] TumorNext-HRD+CancerNext paired germline and somatic genetic test. The report date is 09/23/2019.  The TumorNext-HRD+CancerNext test offered by [facility] includes paired germline and tumor analyses of 11 genes associated with homologous recombination repair (ATM, BARD1, BRIP1, CHEK2, MRE11A, NBN, PALB2, RAD51C, RAD51D, BRCA1, BRCA2) plus germline analyses of 26 additional genes associated with hereditary cancer (APC, AXIN2, BMPR1A, CDH1, CDK4, CDKN2A, DICER1, HOXB13, EPCAM, GREM1, MLH1, MSH2, MSH3, MSH6, MUTYH, NF1, NTHL1, PMS2, POLD1, POLE, PTEN, RECQL, SMAD4, SMARCA4, STK11, and TP53).   11/14/2019 Tumor Marker   Patient's tumor was tested for the following markers: CA-125 Results of the tumor marker test revealed 10.2.   12/05/2019 Tumor Marker   Patient's tumor was tested for the following markers: CA-125 Results of the tumor marker test revealed 9.9   01/05/2020 Imaging   No specific findings of active malignancy. Interval resection of the pelvic mass. Subtle nodularity along the right side of the vaginal cuff merits surveillance.     01/05/2020 Tumor Marker   Patient's tumor was tested for the following markers: CA-125 Results of the tumor marker test revealed 9.7   02/17/2020 Tumor Marker   Patient's tumor was tested for the following markers: CA-125 Results of the tumor marker test revealed 9.6   04/05/2020 Imaging   1. There is some minimal stranding along the mesentery and omentum without overt omental caking or well-defined nodularity. Faint bandlike density along the anterior peritoneal reflection in the pelvis, slightly more notable than on prior. Overall the  appearance is not considered specific for recurrence but given the slight increase in prominence from 01/05/2020, would encourage careful surveillance by tumor markers and imaging. 2. Mild lower lumbar degenerative facet arthropathy.   04/05/2020 Tumor Marker   Patient's tumor was tested for the following markers: CA-125 Results of the tumor marker test revealed 11   05/29/2020 Tumor Marker   Patient's tumor was tested for the following markers: CA-125. Results of the tumor marker test revealed 17.   07/04/2020 Tumor Marker   Patient's tumor was tested for the following markers: CA-125. Results of the tumor marker test revealed 20.1   07/16/2020 Imaging   Stable mild stranding throughout mesenteric and omental fat, without evidence of discrete masses or ascites. No evidence of new or progressive disease.   08/29/2020 Tumor Marker   Patient's tumor was tested for the following markers: CA-125 Results of the tumor marker test revealed 32.7.   09/07/2020 Imaging   1. Interval progression of peritoneal disease within the abdomen and pelvis. Tumor predominantly involves the serosal surface of the large and small bowel loops. No signs of bowel obstruction identified at this time. 2. No evidence for metastatic disease to the chest.   09/24/2020 -  Chemotherapy    Patient is on Treatment Plan: OVARIAN RECURRENT LIPOSOMAL DOXORUBICIN + CARBOPLATIN AND AVASTIN Q28D X 6 CYCLES         Objective:  Vitals:   09/19/20 1400 09/19/20 1520  BP: (!) 126/92 (!) 132/92  Pulse: 82 79  Resp: 18 19  Temp:    SpO2: 100% 100%     Intake/Output  Summary (Last 24 hours) at 09/19/2020 1527 Last data filed at 09/19/2020 1401 Gross per 24 hour  Intake 1000 ml  Output --  Net 1000 ml    GENERAL:alert, no distress and comfortable SKIN: skin color, texture, turgor are normal, no rashes or significant lesions EYES: normal, Conjunctiva are pink and non-injected, sclera clear OROPHARYNX:no exudate, no  erythema and lips, buccal mucosa, and tongue normal  NECK: supple, thyroid normal size, non-tender, without nodularity LYMPH:  no palpable lymphadenopathy in the cervical, axillary or inguinal LUNGS: clear to auscultation and percussion with normal breathing effort HEART: regular rate & rhythm and no murmurs and no lower extremity edema ABDOMEN:abdomen firm with palpable left upper quadrant mass consistent with carcinomatosis Musculoskeletal:no cyanosis of digits and no clubbing  NEURO: alert & oriented x 3 with fluent speech, no focal motor/sensory deficits   Labs:  Recent Labs    01/05/20 0840 02/17/20 0850 04/05/20 1008 05/29/20 1129 07/10/20 0834 08/29/20 1036 09/19/20 1118  NA 140 139 138   < > 139 141 140  K 3.8 3.4* 3.6   < > 3.2* 3.1* 3.6  CL 105 105 102   < > 101 104 102  CO2 '24 24 27   ' < > '25 28 27  ' GLUCOSE 114* 111* 122*   < > 143* 113* 121*  BUN '8 11 9   ' < > '11 7 8  ' CREATININE 0.73 0.66 0.68   < > 0.85 0.65 0.66  CALCIUM 9.3 9.5 10.1   < > 9.4 9.3 9.6  GFRNONAA >60 >60 >60   < > >60 >60 >60  GFRAA >60 >60 >60  --   --   --   --   PROT 7.4 7.4 7.5   < > 8.1 7.2 7.9  ALBUMIN 3.8 3.9 3.8   < > 4.4 3.9 4.1  AST 28 36 42*   < > 31 18 39  ALT 31 39 41   < > 35 19 48*  ALKPHOS 98 88 96   < > 91 87 101  BILITOT 0.3 0.4 0.5   < > 0.6 0.4 0.6   < > = values in this interval not displayed.    Studies: I have reviewed multiple imaging studies with the patient CT ABDOMEN PELVIS W CONTRAST  Result Date: 09/19/2020 CLINICAL DATA:  Abdominal pain for 3 weeks. Ovarian cancer. Rule out bowel obstruction. EXAM: CT ABDOMEN AND PELVIS WITH CONTRAST TECHNIQUE: Multidetector CT imaging of the abdomen and pelvis was performed using the standard protocol following bolus administration of intravenous contrast. CONTRAST:  171m OMNIPAQUE IOHEXOL 300 MG/ML  SOLN COMPARISON:  09/07/2020 FINDINGS: Lower chest: No acute abnormality. Hepatobiliary: No focal liver abnormality is seen. Status post  cholecystectomy. No biliary dilatation. Pancreas: Unremarkable. No pancreatic ductal dilatation or surrounding inflammatory changes. Spleen: Normal in size without focal abnormality. Adrenals/Urinary Tract: Normal adrenal glands. No kidney mass or hydronephrosis identified. Urinary bladder is unremarkable. Stomach/Bowel: Stomach is within normal limits. Appendix appears normal. Similar appearance of soft tissue thickening along the serosal surface of small and large bowel loops consistent with peritoneal disease. Subjectively this is similar to the previous exam. Vascular/Lymphatic: Normal appearance of the abdominal aorta. No aneurysm. No abdominopelvic adenopathy identified. Reproductive: Status post hysterectomy. No adnexal masses. Other: There is no ascites. Soft tissue thickening is again noted along the peritoneal reflections within the abdomen and pelvis compatible with known peritoneal disease. The appearance is similar to the previous exam. Interloop soft tissue thickening with tethering of  pelvic small bowel loops is difficult to quantify in compared with previous exam reflecting lack of enteric contrast material. Previously described soft tissue thickening along the serosal surface of the proximal transverse colon is similar to previous exam, image 45/5. Recently characterized increased soft tissue thickening along the surface of the jejunal bowel loops is difficult to visualize, also reflecting lack of IV contrast material. Musculoskeletal: No acute or significant osseous findings. IMPRESSION: 1. No acute findings identified within the abdomen or pelvis. No evidence for bowel obstruction. 2. Similar appearance of soft tissue thickening along the peritoneal reflections within the abdomen and pelvis compatible with known peritoneal disease. Recently characterized increased soft tissue along the surface of the jejunal bowel loops and tethering of pelvic small bowel loops is difficult to quantify (and  compared with previous exam) due to lack of IV contrast material. Electronically Signed   By: Kerby Moors M.D.   On: 09/19/2020 14:15   CT CHEST ABDOMEN PELVIS W CONTRAST  Result Date: 09/07/2020 CLINICAL DATA:  Restaging ovarian cancer. EXAM: CT CHEST, ABDOMEN, AND PELVIS WITH CONTRAST TECHNIQUE: Multidetector CT imaging of the chest, abdomen and pelvis was performed following the standard protocol during bolus administration of intravenous contrast. CONTRAST:  191m OMNIPAQUE IOHEXOL 300 MG/ML  SOLN COMPARISON:  CT AP 07/16/2020.  CT CAP 01/05/2020 FINDINGS: CT CHEST FINDINGS Cardiovascular: Heart size appears within normal limits. No pericardial effusion. Mediastinum/Nodes: Normal appearance of the thyroid gland. The trachea appears patent and is midline. Normal appearance of the esophagus. No enlarged lymph nodes. Lungs/Pleura: Bilateral lower lobe calcified granulomas. No suspicious lung nodules. Musculoskeletal: No chest wall mass or suspicious bone lesions identified. CT ABDOMEN PELVIS FINDINGS Hepatobiliary: No focal liver abnormality is seen. Status post cholecystectomy. No biliary dilatation. Pancreas: Unremarkable. No pancreatic ductal dilatation or surrounding inflammatory changes. Spleen: Normal in size without focal abnormality. Adrenals/Urinary Tract: Normal adrenal glands. No kidney mass or hydronephrosis identified. Urinary bladder is unremarkable. Stomach/Bowel: The stomach appears normal. The appendix is visualized and is within normal limits. No pathologic dilatation of the large or small bowel loops. Soft tissue thickening along the serosal surface of the small and large bowel loops is again identified compatible with peritoneal disease. Subjectively this appears increased from the previous exam from 07/16/2020 Vascular/Lymphatic: No aneurysm.  No abdominopelvic adenopathy. Reproductive: Status post hysterectomy. No adnexal masses. Other: Moderate diffuse soft tissue thickening is  identified along the peritoneal surface of the abdomen and pelvis. Multifocal areas of serosal involvement of the large and small bowel noted within the abdomen and pelvis. -For example, within the pelvis there is focal area interloop soft tissue with tethering of pelvic small bowel loops, image 72/5. Here central area of increased soft tissue measures 1.9 x 1.5 cm, image 72/5. Previously this area measured 1.8 x 1.4 cm. -Within the right abdomen there is progressive soft tissue along the serosal surface of the proximal transverse colon, image 44/5. -Within the central abdomen there is increased soft tissue thickening along the surface of the jejunal bowel loops, image 38/5. Musculoskeletal: No acute or significant osseous findings. IMPRESSION: 1. Interval progression of peritoneal disease within the abdomen and pelvis. Tumor predominantly involves the serosal surface of the large and small bowel loops. No signs of bowel obstruction identified at this time. 2. No evidence for metastatic disease to the chest. Electronically Signed   By: TKerby MoorsM.D.   On: 09/07/2020 10:21

## 2020-09-20 DIAGNOSIS — R110 Nausea: Secondary | ICD-10-CM | POA: Diagnosis not present

## 2020-09-20 DIAGNOSIS — R109 Unspecified abdominal pain: Secondary | ICD-10-CM | POA: Diagnosis not present

## 2020-09-20 DIAGNOSIS — U071 COVID-19: Secondary | ICD-10-CM

## 2020-09-20 DIAGNOSIS — C762 Malignant neoplasm of abdomen: Secondary | ICD-10-CM

## 2020-09-20 DIAGNOSIS — K5900 Constipation, unspecified: Secondary | ICD-10-CM

## 2020-09-20 DIAGNOSIS — T380X5A Adverse effect of glucocorticoids and synthetic analogues, initial encounter: Secondary | ICD-10-CM

## 2020-09-20 DIAGNOSIS — C562 Malignant neoplasm of left ovary: Secondary | ICD-10-CM | POA: Diagnosis not present

## 2020-09-20 DIAGNOSIS — E099 Drug or chemical induced diabetes mellitus without complications: Secondary | ICD-10-CM

## 2020-09-20 LAB — COMPREHENSIVE METABOLIC PANEL
ALT: 34 U/L (ref 0–44)
AST: 25 U/L (ref 15–41)
Albumin: 3.5 g/dL (ref 3.5–5.0)
Alkaline Phosphatase: 82 U/L (ref 38–126)
Anion gap: 10 (ref 5–15)
BUN: <5 mg/dL — ABNORMAL LOW (ref 6–20)
CO2: 25 mmol/L (ref 22–32)
Calcium: 8.8 mg/dL — ABNORMAL LOW (ref 8.9–10.3)
Chloride: 104 mmol/L (ref 98–111)
Creatinine, Ser: 0.5 mg/dL (ref 0.44–1.00)
GFR, Estimated: >60 mL/min (ref >60)
Glucose, Bld: 100 mg/dL — ABNORMAL HIGH (ref 70–99)
Potassium: 3.4 mmol/L — ABNORMAL LOW (ref 3.5–5.1)
Sodium: 139 mmol/L (ref 135–145)
Total Bilirubin: 0.6 mg/dL (ref 0.3–1.2)
Total Protein: 6.5 g/dL (ref 6.5–8.1)

## 2020-09-20 LAB — CBC
HCT: 30.9 % — ABNORMAL LOW (ref 36.0–46.0)
Hemoglobin: 9.9 g/dL — ABNORMAL LOW (ref 12.0–15.0)
MCH: 28.6 pg (ref 26.0–34.0)
MCHC: 32 g/dL (ref 30.0–36.0)
MCV: 89.3 fL (ref 80.0–100.0)
Platelets: 303 10*3/uL (ref 150–400)
RBC: 3.46 MIL/uL — ABNORMAL LOW (ref 3.87–5.11)
RDW: 11.9 % (ref 11.5–15.5)
WBC: 5 10*3/uL (ref 4.0–10.5)
nRBC: 0 % (ref 0.0–0.2)

## 2020-09-20 LAB — GLUCOSE, CAPILLARY
Glucose-Capillary: 104 mg/dL — ABNORMAL HIGH (ref 70–99)
Glucose-Capillary: 118 mg/dL — ABNORMAL HIGH (ref 70–99)
Glucose-Capillary: 189 mg/dL — ABNORMAL HIGH (ref 70–99)
Glucose-Capillary: 193 mg/dL — ABNORMAL HIGH (ref 70–99)

## 2020-09-20 MED ORDER — PROCHLORPERAZINE EDISYLATE 10 MG/2ML IJ SOLN: 10.0000 mg | Freq: Four times a day (QID) | INTRAMUSCULAR | Status: DC | PRN

## 2020-09-20 MED ORDER — SENNOSIDES-DOCUSATE SODIUM 8.6-50 MG PO TABS
1.0000 | ORAL_TABLET | Freq: Two times a day (BID) | ORAL | Status: DC
  Administered 2020-09-20 – 2020-09-21 (×2): 1 via ORAL
  Filled 2020-09-20 (×2): qty 1

## 2020-09-20 MED ORDER — SODIUM CHLORIDE 0.9 % IV SOLN
150.0000 mg | Freq: Once | INTRAVENOUS | Status: AC
  Administered 2020-09-20: 150 mg via INTRAVENOUS
  Filled 2020-09-20: qty 5

## 2020-09-20 MED ORDER — ONDANSETRON HCL 4 MG/2ML IJ SOLN: 4.0000 mg | Freq: Four times a day (QID) | INTRAMUSCULAR | Status: DC | PRN

## 2020-09-20 MED ORDER — ADULT MULTIVITAMIN W/MINERALS CH
1.0000 | ORAL_TABLET | Freq: Every day | ORAL | Status: DC
  Administered 2020-09-20 – 2020-09-21 (×2): 1 via ORAL
  Filled 2020-09-20 (×2): qty 1

## 2020-09-20 MED ORDER — SODIUM CHLORIDE 0.9 % IV SOLN
600.0000 mg | Freq: Once | INTRAVENOUS | Status: AC
  Administered 2020-09-20: 600 mg via INTRAVENOUS
  Filled 2020-09-20: qty 60

## 2020-09-20 MED ORDER — ENOXAPARIN SODIUM 40 MG/0.4ML ~~LOC~~ SOLN
40.0000 mg | SUBCUTANEOUS | Status: DC
  Administered 2020-09-20 – 2020-09-21 (×2): 40 mg via SUBCUTANEOUS
  Filled 2020-09-20 (×2): qty 0.4

## 2020-09-20 MED ORDER — POLYETHYLENE GLYCOL 3350 17 G PO PACK
17.0000 g | PACK | Freq: Every day | ORAL | Status: DC
  Administered 2020-09-20 – 2020-09-21 (×2): 17 g via ORAL
  Filled 2020-09-20 (×2): qty 1

## 2020-09-20 MED ORDER — PALONOSETRON HCL INJECTION 0.25 MG/5ML
0.2500 mg | Freq: Once | INTRAVENOUS | Status: AC
  Administered 2020-09-20: 0.25 mg via INTRAVENOUS
  Filled 2020-09-20: qty 5

## 2020-09-20 MED ORDER — DIPHENHYDRAMINE HCL 50 MG/ML IJ SOLN: 50.0000 mg | Freq: Once | INTRAMUSCULAR | Status: DC

## 2020-09-20 MED ORDER — FAMOTIDINE IN NACL 20-0.9 MG/50ML-% IV SOLN
20.0000 mg | Freq: Once | INTRAVENOUS | Status: AC
  Administered 2020-09-20: 20 mg via INTRAVENOUS
  Filled 2020-09-20: qty 50

## 2020-09-20 MED ORDER — ENSURE ENLIVE PO LIQD: 237.0000 mL | Freq: Two times a day (BID) | ORAL | Status: DC

## 2020-09-20 MED ORDER — SODIUM CHLORIDE 0.9 % IV SOLN
800.0000 mg/m2 | Freq: Once | INTRAVENOUS | Status: AC
  Administered 2020-09-20: 1634 mg via INTRAVENOUS
  Filled 2020-09-20: qty 42.98

## 2020-09-20 MED ORDER — DIPHENHYDRAMINE HCL 50 MG/ML IJ SOLN
25.0000 mg | Freq: Once | INTRAMUSCULAR | Status: AC
  Administered 2020-09-20: 25 mg via INTRAVENOUS
  Filled 2020-09-20: qty 1

## 2020-09-20 MED ORDER — CHLORHEXIDINE GLUCONATE CLOTH 2 % EX PADS
6.0000 | MEDICATED_PAD | Freq: Every day | CUTANEOUS | Status: DC
  Administered 2020-09-20 – 2020-09-21 (×2): 6 via TOPICAL

## 2020-09-20 MED ORDER — SODIUM CHLORIDE 0.9 % IV SOLN
10.0000 mg | Freq: Once | INTRAVENOUS | Status: AC
  Administered 2020-09-20: 10 mg via INTRAVENOUS
  Filled 2020-09-20: qty 1

## 2020-09-20 MED ORDER — ONDANSETRON HCL 4 MG PO TABS: 4.0000 mg | ORAL_TABLET | Freq: Four times a day (QID) | ORAL | Status: DC | PRN

## 2020-09-20 NOTE — Assessment & Plan Note (Addendum)
-  Due to progression of ovarian cancer.  Seen on CT abdomen/pelvis on admission -Patient undergoing urgent chemo inpatient; tolerated well on 09/20/2020 -Plan is for outpatient ongoing chemo -Pain and nausea/vomiting was resolved prior to discharge

## 2020-09-20 NOTE — Progress Notes (Signed)
Initial Nutrition Assessment  DOCUMENTATION CODES:   Obesity unspecified  INTERVENTION:   -Ensure Enlive po BID, each supplement provides 350 kcal and 20 grams of protein  -Multivitamin with minerals daily  NUTRITION DIAGNOSIS:   Increased nutrient needs related to cancer and cancer related treatments as evidenced by estimated needs.  GOAL:   Patient will meet greater than or equal to 90% of their needs  MONITOR:   PO intake,Supplement acceptance,Labs,Weight trends,I & O's  REASON FOR ASSESSMENT:   Malnutrition Screening Tool    ASSESSMENT:   45 y.o. female with medical history significant of Hx of ovarian CA s/p chemo, PUD, steroid induced DM. Presenting with 3 weeks of N/V/abdominal pain.  COVID+ since 2/7 per chart review.  Patient undergoing chemotherapy for ovarian cancer. Pt has been having nausea/vomiting PTA which has impacted her ability to tolerate POs.  Diet was advanced to soft diet today.  Will order protein supplements given increased needs.  Pt reports losing 10 lbs over the last 1.5 months. Per weight records, pt has lost 8 lbs since 12/14 (3% wt loss x 2 months, insignificant for time frame).   Medications: Multivitamin with minerals daily, IV Zofran  Labs reviewed:  CBGs: 104-118 Low K  NUTRITION - FOCUSED PHYSICAL EXAM:  Unable to complete  Diet Order:   Diet Order            DIET SOFT Room service appropriate? Yes; Fluid consistency: Thin  Diet effective now                 EDUCATION NEEDS:   No education needs have been identified at this time  Skin:  Skin Assessment: Reviewed RN Assessment  Last BM:  2/15  Height:   Ht Readings from Last 1 Encounters:  09/19/20 5\' 4"  (1.626 m)    Weight:   Wt Readings from Last 1 Encounters:  09/19/20 95.4 kg   BMI:  Body mass index is 36.1 kg/m.  Estimated Nutritional Needs:   Kcal:  1950-2150  Protein:  85-100g  Fluid:  2.1L/day  Clayton Bibles, MS, RD, LDN Inpatient  Clinical Dietitian Contact information available via Am[employer]

## 2020-09-20 NOTE — Progress Notes (Addendum)
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 Previously, I have reviewed the plan of care and recommend carboplatin with Doxil Given the urgency of situation, I plan to change her treatment to carboplatin with gemcitabine The risk benefits, side effects of treatment were discussed with the patient and she is in agreement to proceed. I am hopeful she can feel better soon after chemotherapy is started  Abdominal carcinomatosis with abdominal pain, nausea and inability to tolerate oral intake Thankfully, CT imaging did not show evidence of bowel obstruction Continue IV fluids, IV pain medicine as needed and IV antiemetics as needed She can continue clear liquids Plan to switch her medications to oral medications starting tomorrow  Code Status Full code  Asymptomatic COVID infection Isolation. She received monoclonal antibody treatment recently Observe for now  Goals of care Resolution of bowel obstructive symptoms  Discharge planning Likely next week  All questions were answered. The patient knows to call the clinic with any problems, questions or concerns.  Mikey Bussing, NP 09/20/2020 10:13 AM Heath Lark, MD  Subjective:  The patient reports that she feels better this morning Abdominal pain improved with IV Dilaudid She is not having any nausea or vomiting this morning-Zofran has been effective She reports "rumbling" in her stomach Denies flatus Last bowel movement 09/18/2020  Oncology History Overview Note  Clear cell features Negative genetics   Malignant neoplasm of  left ovary ([place])  07/02/2019 Imaging   Ct scan of abdomen and pelvis 1. There is a 16 cm complex cystic mass in the pelvis containing thickened septations located near midline, abutting the superior aspect of the uterus. I suspect this mass is adnexal/ovarian in origin rather than uterine in origin. Specifically, I suspect the mass likely arises from the left ovary/adnexa and is most consistent with a neoplasm. Malignancy is certainly not excluded on this study. Recommend a pelvic ultrasound for further evaluation. 2. The rounded more solid-appearing structure in the right side of the pelvis is probably the right ovary. Recommend attention on ultrasound. 3. The small amount of fluid in the pelvis is likely reactive to the complex cystic mass likely rising from the left ovary/adnexa. 4. No cause for blood in vomit or black stools identified. No convincing evidence of a perforated or inflamed gastric or duodenal ulcer. 5. Hepatic steatosis.   07/02/2019 Imaging   MRI pelvis 1. 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 There are 2 enhancing nodules within the central upper abdomen raising the possibility of peritoneal carcinomatosis. Additionally, there is a small amount fluid within the pelvis. Malignant ascites not excluded. 2. Fibroid uterus. 3. Hepatic steatosis.   07/02/2019 Imaging   US pelvis 1. There is a large mass in the pelvis also seen on CT imaging. A separate left ovary is not visualized. 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 The mass is suspicious for malignancy. Recommend gynecologic consultation. 2. The right ovary demonstrates an irregular ill-defined hypoechoic hypervascular region centrally. The findings are nonspecific in the right ovary.  However, given the apparent left ovarian neoplasm suspicious for malignancy, recommend an MRI to further  assess the right ovary. 3. Uterine fibroid measuring 3.6 cm.     07/21/2019 Pathology Results   FINAL MICROSCOPIC DIAGNOSIS: A. 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 - See oncology table. B. OMENTUM, RESECTION: - Omental lymph node with metastatic carcinoma (1/1). - Omental adipose tissue with foci of inflammation and reactive mesothelial changes. C. FALLOPIAN TUBE, LEFT, RESECTION: - Fallopian tube with focal, mild inflammation and fibrosis. - No tumor identified. D. UTERUS, CERVIX, RIGHT TUBE AND OVARY: - Cervix Nabothian cyst and squamous metaplasia. - Endometrium Proliferative. No hyperplasia or carcinoma. - Myometrium Leiomyomata. No malignancy. -Right ovary Poorly differentiated carcinoma, 4.8 cm. See oncology table and comment. -Right Fallopian tube Benign paratubal cyst. No endometriosis or malignancy. ONCOLOGY TABLE: OVARY or FALLOPIAN TUBE or PRIMARY PERITONEUM: Procedure: Bilateral f-oophorectomy, hysterectomy and omentectomy. Specimen Integrity: Intact Tumor Site: Left ovary and right ovary. See comment. Ovarian Surface Involvement: Not identified. Fallopian Tube Surface Involvement: Not identified. Tumor Size: Left ovary: 18.6 x 16.0 x 7.2 cm. Right ovary: 4.8 x 3.2 x 3.2 cm. Histologic Type: Clear cell carcinoma. See comment. Histologic Grade: High-grade. Other Tissue/ Organ Involvement: Omental lymph node with metastatic carcinoma. Peritoneal/Ascitic Fluid: Pending. Treatment Effect: No known presurgical therapy. Pathologic Stage Classification (pTNM, AJCC 8th Edition): pT3a, pN1a. Representative Tumor Block: A2, A3, A4, A5 A6, A7 and A8. Comment(s): The left ovarian tumor is 18.6 cm in greatest dimension and has features of clear cell carcinoma. The right ovarian tumor is 4.8 cm in greatest dimension and is poorly differentiated with focal features consistent with clear cell carcinoma. The pattern of involvement  suggests that the right ovarian tumor is likely metastatic from the larger left ovarian clear cell carcinoma. Sections of the omentum show a lymph node with metastatic carcinoma. Sections of the remainder of the omentum do not show involvement by carcinoma.   07/21/2019 Surgery   Procedure(s) Performed: Exploratory laparotomy with radical tumor debulking including total hysterectomy, bilateral salpingo-oophorectomy, infra-gastric omentectomy, and washings.   Surgeon: Jeral Pinch, MD    Operative Findings: On EUA, mobile uterus and ovarian mass, situated out of the pelvis, spanning 6-8 cm above the umbilicus.  On intra-abdominal entry, inflamed omentum adherent to much of the 16 cm left ovarian mass, adhesions easily lysed bluntly.  No nodularity of the ovarian mass however on delivery of the mass through the midline incision, there was Intra-Op rupture of one of the smaller cystic components with drainage of clear brown-tinged fluid.  Grossly normal-appearing left fallopian tube as well as right tube.  Right ovary mildly enlarged measuring approximately 4 cm and firm/nodular, suspicious for tumor involvement.  Uterus 8-10 cm with 4 cm anterior mid body fibroid.  Some very small volume miliary disease along right pelvic sidewall, removed with uterine specimen.  Evidence of small diverticular disease.  Small bowel normal appearing although multiple loops of bowel adherent to each other, especially by mesentery, and an inflammatory manner.  An approximately 2 x 2 centimeter nodule suspicious for tumor implant was found in the omentum just inferior to the greater curvature of the stomach.  Additional small (less than 5 mm) implants noted on the stomach and posterior aspect of the left lobe of the liver.  Otherwise the liver surface and diaphragm were smooth.   08/03/2019 Cancer Staging   Staging form: Ovary, Fallopian Tube, and Primary Peritoneal Carcinoma, AJCC 8th Edition - Pathologic: Stage IIIA2  (  pT3a, pN1, cM0) - Signed by Heath Lark, MD on 08/03/2019   08/12/2019 Procedure   Placement of single lumen port a cath via right internal jugular vein. The catheter tip lies at the cavo-atrial junction. A power injectable port a cath was placed and is ready for immediate use.   08/22/2019 -  Chemotherapy   The patient had carboplatin and taxol for chemotherapy treatment.     09/23/2019 Genetic Testing   Negative genetic testing:  No pathogenic variants detected on the [facility] TumorNext-HRD+CancerNext paired germline and somatic genetic test. The report date is 09/23/2019.  The TumorNext-HRD+CancerNext test offered by [facility] includes paired germline and tumor analyses of 11 genes associated with homologous recombination repair (ATM, BARD1, BRIP1, CHEK2, MRE11A, NBN, PALB2, RAD51C, RAD51D, BRCA1, BRCA2) plus germline analyses of 26 additional genes associated with hereditary cancer (APC, AXIN2, BMPR1A, CDH1, CDK4, CDKN2A, DICER1, HOXB13, EPCAM, GREM1, MLH1, MSH2, MSH3, MSH6, MUTYH, NF1, NTHL1, PMS2, POLD1, POLE, PTEN, RECQL, SMAD4, SMARCA4, STK11, and TP53).   11/14/2019 Tumor Marker   Patient's tumor was tested for the following markers: CA-125 Results of the tumor marker test revealed 10.2.   12/05/2019 Tumor Marker   Patient's tumor was tested for the following markers: CA-125 Results of the tumor marker test revealed 9.9   01/05/2020 Imaging   No specific findings of active malignancy. Interval resection of the pelvic mass. Subtle nodularity along the right side of the vaginal cuff merits surveillance.     01/05/2020 Tumor Marker   Patient's tumor was tested for the following markers: CA-125 Results of the tumor marker test revealed 9.7   02/17/2020 Tumor Marker   Patient's tumor was tested for the following markers: CA-125 Results of the tumor marker test revealed 9.6   04/05/2020 Imaging   1. There is some minimal stranding along the mesentery and omentum without overt omental caking or  well-defined nodularity. Faint bandlike density along the anterior peritoneal reflection in the pelvis, slightly more notable than on prior. Overall the appearance is not considered specific for recurrence but given the slight increase in prominence from 01/05/2020, would encourage careful surveillance by tumor markers and imaging. 2. Mild lower lumbar degenerative facet arthropathy.   04/05/2020 Tumor Marker   Patient's tumor was tested for the following markers: CA-125 Results of the tumor marker test revealed 11   05/29/2020 Tumor Marker   Patient's tumor was tested for the following markers: CA-125. Results of the tumor marker test revealed 17.   07/04/2020 Tumor Marker   Patient's tumor was tested for the following markers: CA-125. Results of the tumor marker test revealed 20.1   07/16/2020 Imaging   Stable mild stranding throughout mesenteric and omental fat, without evidence of discrete masses or ascites. No evidence of new or progressive disease.   08/29/2020 Tumor Marker   Patient's tumor was tested for the following markers: CA-125 Results of the tumor marker test revealed 32.7.   09/07/2020 Imaging   1. Interval progression of peritoneal disease within the abdomen and pelvis. Tumor predominantly involves the serosal surface of the large and small bowel loops. No signs of bowel obstruction identified at this time. 2. No evidence for metastatic disease to the chest.   09/20/2020 -  Chemotherapy    Patient is on Treatment Plan: OVARIAN RECURRENT 3RD LINE CARBOPLATIN D1 / GEMCITABINE D1,8 (4/800) Q21D      09/24/2020 - 09/24/2020 Chemotherapy            Objective:  Vitals:   09/20/20 0814 09/20/20 4818  BP: 109/79 111/80  Pulse: 68 67  Resp: 18 16  Temp: 97.7 F (36.5 C) (!) 97.5 F (36.4 C)  SpO2: 100% 100%     Intake/Output Summary (Last 24 hours) at 09/20/2020 1013 Last data filed at 09/20/2020 0431 Gross per 24 hour  Intake 2077.18 ml  Output -  Net 2077.18 ml     GENERAL:alert, no distress and comfortable SKIN: skin color, texture, turgor are normal, no rashes or significant lesions EYES: normal, Conjunctiva are pink and non-injected, sclera clear OROPHARYNX:no exudate, no erythema and lips, buccal mucosa, and tongue normal  NECK: supple, thyroid normal size, non-tender, without nodularity LYMPH:  no palpable lymphadenopathy in the cervical, axillary or inguinal LUNGS: clear to auscultation and percussion with normal breathing effort HEART: regular rate & rhythm and no murmurs and no lower extremity edema ABDOMEN: Positive bowel sounds, abdomen firm with palpable left upper quadrant mass consistent with carcinomatosis Musculoskeletal:no cyanosis of digits and no clubbing  NEURO: alert & oriented x 3 with fluent speech, no focal motor/sensory deficits   Labs:  Recent Labs    01/05/20 0840 02/17/20 0850 04/05/20 1008 05/29/20 1129 07/10/20 0834 08/29/20 1036 09/19/20 1118  NA 140 139 138   < > 139 141 140  K 3.8 3.4* 3.6   < > 3.2* 3.1* 3.6  CL 105 105 102   < > 101 104 102  CO2 '24 24 27   ' < > '25 28 27  ' GLUCOSE 114* 111* 122*   < > 143* 113* 121*  BUN '8 11 9   ' < > '11 7 8  ' CREATININE 0.73 0.66 0.68   < > 0.85 0.65 0.66  CALCIUM 9.3 9.5 10.1   < > 9.4 9.3 9.6  GFRNONAA >60 >60 >60   < > >60 >60 >60  GFRAA >60 >60 >60  --   --   --   --   PROT 7.4 7.4 7.5   < > 8.1 7.2 7.9  ALBUMIN 3.8 3.9 3.8   < > 4.4 3.9 4.1  AST 28 36 42*   < > 31 18 39  ALT 31 39 41   < > 35 19 48*  ALKPHOS 98 88 96   < > 91 87 101  BILITOT 0.3 0.4 0.5   < > 0.6 0.4 0.6   < > = values in this interval not displayed.    Studies: I have reviewed multiple imaging studies with the patient CT ABDOMEN PELVIS W CONTRAST  Result Date: 09/19/2020 CLINICAL DATA:  Abdominal pain for 3 weeks. Ovarian cancer. Rule out bowel obstruction. EXAM: CT ABDOMEN AND PELVIS WITH CONTRAST TECHNIQUE: Multidetector CT imaging of the abdomen and pelvis was performed using the standard  protocol following bolus administration of intravenous contrast. CONTRAST:  134m OMNIPAQUE IOHEXOL 300 MG/ML  SOLN COMPARISON:  09/07/2020 FINDINGS: Lower chest: No acute abnormality. Hepatobiliary: No focal liver abnormality is seen. Status post cholecystectomy. No biliary dilatation. Pancreas: Unremarkable. No pancreatic ductal dilatation or surrounding inflammatory changes. Spleen: Normal in size without focal abnormality. Adrenals/Urinary Tract: Normal adrenal glands. No kidney mass or hydronephrosis identified. Urinary bladder is unremarkable. Stomach/Bowel: Stomach is within normal limits. Appendix appears normal. Similar appearance of soft tissue thickening along the serosal surface of small and large bowel loops consistent with peritoneal disease. Subjectively this is similar to the previous exam. Vascular/Lymphatic: Normal appearance of the abdominal aorta. No aneurysm. No abdominopelvic adenopathy identified. Reproductive: Status post hysterectomy. No adnexal masses. Other: There is no ascites.  Soft tissue thickening is again noted along the peritoneal reflections within the abdomen and pelvis compatible with known peritoneal disease. The appearance is similar to the previous exam. Interloop soft tissue thickening with tethering of pelvic small bowel loops is difficult to quantify in compared with previous exam reflecting lack of enteric contrast material. Previously described soft tissue thickening along the serosal surface of the proximal transverse colon is similar to previous exam, image 45/5. Recently characterized increased soft tissue thickening along the surface of the jejunal bowel loops is difficult to visualize, also reflecting lack of IV contrast material. Musculoskeletal: No acute or significant osseous findings. IMPRESSION: 1. No acute findings identified within the abdomen or pelvis. No evidence for bowel obstruction. 2. Similar appearance of soft tissue thickening along the peritoneal  reflections within the abdomen and pelvis compatible with known peritoneal disease. Recently characterized increased soft tissue along the surface of the jejunal bowel loops and tethering of pelvic small bowel loops is difficult to quantify (and compared with previous exam) due to lack of IV contrast material. Electronically Signed   By: Kerby Moors M.D.   On: 09/19/2020 14:15   CT CHEST ABDOMEN PELVIS W CONTRAST  Result Date: 09/07/2020 CLINICAL DATA:  Restaging ovarian cancer. EXAM: CT CHEST, ABDOMEN, AND PELVIS WITH CONTRAST TECHNIQUE: Multidetector CT imaging of the chest, abdomen and pelvis was performed following the standard protocol during bolus administration of intravenous contrast. CONTRAST:  175m OMNIPAQUE IOHEXOL 300 MG/ML  SOLN COMPARISON:  CT AP 07/16/2020.  CT CAP 01/05/2020 FINDINGS: CT CHEST FINDINGS Cardiovascular: Heart size appears within normal limits. No pericardial effusion. Mediastinum/Nodes: Normal appearance of the thyroid gland. The trachea appears patent and is midline. Normal appearance of the esophagus. No enlarged lymph nodes. Lungs/Pleura: Bilateral lower lobe calcified granulomas. No suspicious lung nodules. Musculoskeletal: No chest wall mass or suspicious bone lesions identified. CT ABDOMEN PELVIS FINDINGS Hepatobiliary: No focal liver abnormality is seen. Status post cholecystectomy. No biliary dilatation. Pancreas: Unremarkable. No pancreatic ductal dilatation or surrounding inflammatory changes. Spleen: Normal in size without focal abnormality. Adrenals/Urinary Tract: Normal adrenal glands. No kidney mass or hydronephrosis identified. Urinary bladder is unremarkable. Stomach/Bowel: The stomach appears normal. The appendix is visualized and is within normal limits. No pathologic dilatation of the large or small bowel loops. Soft tissue thickening along the serosal surface of the small and large bowel loops is again identified compatible with peritoneal disease.  Subjectively this appears increased from the previous exam from 07/16/2020 Vascular/Lymphatic: No aneurysm.  No abdominopelvic adenopathy. Reproductive: Status post hysterectomy. No adnexal masses. Other: Moderate diffuse soft tissue thickening is identified along the peritoneal surface of the abdomen and pelvis. Multifocal areas of serosal involvement of the large and small bowel noted within the abdomen and pelvis. -For example, within the pelvis there is focal area interloop soft tissue with tethering of pelvic small bowel loops, image 72/5. Here central area of increased soft tissue measures 1.9 x 1.5 cm, image 72/5. Previously this area measured 1.8 x 1.4 cm. -Within the right abdomen there is progressive soft tissue along the serosal surface of the proximal transverse colon, image 44/5. -Within the central abdomen there is increased soft tissue thickening along the surface of the jejunal bowel loops, image 38/5. Musculoskeletal: No acute or significant osseous findings. IMPRESSION: 1. Interval progression of peritoneal disease within the abdomen and pelvis. Tumor predominantly involves the serosal surface of the large and small bowel loops. No signs of bowel obstruction identified at this time. 2. No evidence for metastatic  disease to the chest. Electronically Signed   By: Kerby Moors M.D.   On: 09/07/2020 10:21

## 2020-09-20 NOTE — Assessment & Plan Note (Addendum)
Resolved with repletion. ?

## 2020-09-20 NOTE — Assessment & Plan Note (Addendum)
-  Initially positive on 09/08/2020 -Treated with MAB outpatient -Currently no symptoms and remains on room air

## 2020-09-20 NOTE — Assessment & Plan Note (Signed)
-  On Metformin at home -Continue CBG monitoring; if becomes elevated, will add on SSI -Follow-up A1c = 6.3%

## 2020-09-20 NOTE — Progress Notes (Signed)
Chemotherapy checked independently per procedure by me and by Aldean Baker, RN. Zandra Abts [facility]  09/20/2020

## 2020-09-20 NOTE — Progress Notes (Signed)
PROGRESS NOTE    Sarah Newman   MWN:[id number]  DOB: [birth year]  DOA: 09/19/2020     1  PCP: Heath Lark, MD  CC: Nausea/vomiting, abdominal pain  Hospital Course: Sarah Newman is a 45 yo female with PMH ovarian cancer now with abdominal carcinomatosis, asthma, obesity, PUD, steroid-induced diabetes who presented to the hospital with worsening left upper quadrant pain and nausea/vomiting.  She is followed closely by oncology outpatient and there were plans for starting chemotherapy the end of this month due to recent COVID-19 infection at the beginning of February. Due to her significant change in symptoms she was referred to the ER and underwent CT abdomen/pelvis.  This showed abdominal carcinomatosis and she was recommended for admission per oncology with plans to initiate chemotherapy inpatient.  She is to be started on carboplatin and gemcitabine. She is asymptomatic from COVID-19 and remains on room air.  Initial positive test was noted to be 09/08/2020 and she received MAB treatment.   Interval History:  Patient seen this morning in her room with chemo infusions just about to get started.  She endorses that her abdominal pain is significantly better since yesterday and describes it mostly as chest "soreness" today.  Also no further vomiting since yesterday.  She is amenable for advancing diet to soft diet as she does not like many of the options for full liquid diet.  Denies any cough, fevers, chills, shortness of breath, chest pain.  Old records reviewed in assessment of this patient  ROS: Constitutional: negative for chills and fevers, Respiratory: negative for cough, Cardiovascular: negative for chest pain and Gastrointestinal: positive for constipation  Assessment & Plan: * Abdominal carcinomatosis (Sarah Newman) -Due to progression of ovarian cancer.  Seen on CT abdomen/pelvis on admission -Patient undergoing urgent chemo inpatient -Continue pain and nausea control -see  ovarian cancer  Malignant neoplasm of left ovary [facility]) -Diagnosed December 2020.  Followed by oncology outpatient - s/p ex lap with TAH-BSO, infra-gastric omentectomy on 07/21/2019 -Now with abdominal carcinomatosis on CT abdomen/pelvis on 09/19/2020.  Recommended for admission for initiating chemo inpatient.  Started on carboplatin and gemcitabine (D1, 09/20/20) per oncology - expect nadir's around day 21  Steroid-induced diabetes (Sarah Newman) -On Metformin at home -Continue CBG monitoring; if becomes elevated, will add on SSI -Follow-up A1c = 6.3%  Lab test positive for detection of COVID-19 virus -Initially positive on 09/08/2020 -Treated with MAB outpatient -Currently no symptoms and remains on room air -Continue isolation but no treatment indicated at this time  Hypokalemia -Potassium 3.4 this morning with associated nausea/vomiting on admission -Replete and recheck in a.m.  Anemia, chronic disease -Baseline hemoglobin approximately 10 to 11 g/dL.  Currently 9.9 g/dL this morning and at baseline - continue following CBC while in hospital     Antimicrobials: n/a  DVT prophylaxis: enoxaparin (LOVENOX) injection 40 mg Start: 09/20/20 1000   Code Status:   Code Status: Full Code Family Communication: none present  Disposition Plan: Status is: Inpatient  Remains inpatient appropriate because:Ongoing active pain requiring inpatient pain management, IV treatments appropriate due to intensity of illness or inability to take PO and Inpatient level of care appropriate due to severity of illness   Dispo: The patient is from: Home              Anticipated d/c is to: Home              Anticipated d/c date is: > 3 days  Patient currently is not medically stable to d/c.   Difficult to place patient No  Risk of unplanned readmission score: Unplanned Admission- Pilot do not use: 18.97   Objective: Blood pressure 102/77, pulse 74, temperature 98 F (36.7 C), resp. rate 17,  height 5\' 4"  (1.626 m), weight 95.4 kg, last menstrual period 07/05/2019, SpO2 100 %.  Examination: General appearance: alert, cooperative and no distress Head: Normocephalic, without obvious abnormality, atraumatic Eyes: EOMI Lungs: clear to auscultation bilaterally Heart: regular rate and rhythm and S1, S2 normal Abdomen: soft, ND, BS present, obese Extremities: no edema Skin: mobility and turgor normal Neurologic: Grossly normal  Consultants:   Oncology  Procedures:     Data Reviewed: I have personally reviewed following labs and imaging studies Results for orders placed or performed during the hospital encounter of 09/19/20 (from the past 24 hour(s))  Resp Panel by RT-PCR (Flu A&B, Covid) Nasopharyngeal Swab     Status: Abnormal   Collection Time: 09/19/20  3:55 PM   Specimen: Nasopharyngeal Swab; Nasopharyngeal(NP) swabs in vial transport medium  Result Value Ref Range   SARS Coronavirus 2 by RT PCR POSITIVE (A) NEGATIVE   Influenza A by PCR NEGATIVE NEGATIVE   Influenza B by PCR NEGATIVE NEGATIVE  Urinalysis, Routine w reflex microscopic     Status: Abnormal   Collection Time: 09/19/20  5:10 PM  Result Value Ref Range   Color, Urine YELLOW YELLOW   APPearance CLEAR CLEAR   Specific Gravity, Urine >1.046 (H) 1.005 - 1.030   pH 6.0 5.0 - 8.0   Glucose, UA NEGATIVE NEGATIVE mg/dL   Hgb urine dipstick NEGATIVE NEGATIVE   Bilirubin Urine NEGATIVE NEGATIVE   Ketones, ur NEGATIVE NEGATIVE mg/dL   Protein, ur NEGATIVE NEGATIVE mg/dL   Nitrite NEGATIVE NEGATIVE   Leukocytes,Ua NEGATIVE NEGATIVE  Hemoglobin A1c     Status: Abnormal   Collection Time: 09/19/20  6:36 PM  Result Value Ref Range   Hgb A1c MFr Bld 6.3 (H) 4.8 - 5.6 %   Mean Plasma Glucose 134.11 mg/dL  Glucose, capillary     Status: Abnormal   Collection Time: 09/20/20  5:49 AM  Result Value Ref Range   Glucose-Capillary 104 (H) 70 - 99 mg/dL  Comprehensive metabolic panel     Status: Abnormal   Collection  Time: 09/20/20  9:52 AM  Result Value Ref Range   Sodium 139 135 - 145 mmol/L   Potassium 3.4 (L) 3.5 - 5.1 mmol/L   Chloride 104 98 - 111 mmol/L   CO2 25 22 - 32 mmol/L   Glucose, Bld 100 (H) 70 - 99 mg/dL   BUN <5 (L) 6 - 20 mg/dL   Creatinine, Ser 0.50 0.44 - 1.00 mg/dL   Calcium 8.8 (L) 8.9 - 10.3 mg/dL   Total Protein 6.5 6.5 - 8.1 g/dL   Albumin 3.5 3.5 - 5.0 g/dL   AST 25 15 - 41 U/L   ALT 34 0 - 44 U/L   Alkaline Phosphatase 82 38 - 126 U/L   Total Bilirubin 0.6 0.3 - 1.2 mg/dL   GFR, Estimated >60 >60 mL/min   Anion gap 10 5 - 15  CBC     Status: Abnormal   Collection Time: 09/20/20  9:52 AM  Result Value Ref Range   WBC 5.0 4.0 - 10.5 K/uL   RBC 3.46 (L) 3.87 - 5.11 MIL/uL   Hemoglobin 9.9 (L) 12.0 - 15.0 g/dL   HCT 30.9 (L) 36.0 - 46.0 %  MCV 89.3 80.0 - 100.0 fL   MCH 28.6 26.0 - 34.0 pg   MCHC 32.0 30.0 - 36.0 g/dL   RDW 11.9 11.5 - 15.5 %   Platelets 303 150 - 400 K/uL   nRBC 0.0 0.0 - 0.2 %  Glucose, capillary     Status: Abnormal   Collection Time: 09/20/20 12:09 PM  Result Value Ref Range   Glucose-Capillary 118 (H) 70 - 99 mg/dL    Recent Results (from the past 240 hour(s))  Resp Panel by RT-PCR (Flu A&B, Covid) Nasopharyngeal Swab     Status: Abnormal   Collection Time: 09/19/20  3:55 PM   Specimen: Nasopharyngeal Swab; Nasopharyngeal(NP) swabs in vial transport medium  Result Value Ref Range Status   SARS Coronavirus 2 by RT PCR POSITIVE (A) NEGATIVE Final    Comment: RESULT CALLED TO, READ BACK BY AND VERIFIED WITH: JESSICA LOWDERMILK @ 1939 ON 09/19/20 C VARNER (NOTE) SARS-CoV-2 target nucleic acids are DETECTED.  The SARS-CoV-2 RNA is generally detectable in upper respiratory specimens during the acute phase of infection. Positive results are indicative of the presence of the identified virus, but do not rule out bacterial infection or co-infection with other pathogens not detected by the test. Clinical correlation with patient history  and other diagnostic information is necessary to determine patient infection status. The expected result is Negative.  Fact Sheet for Patients: EntrepreneurPulse.com.au  Fact Sheet for Healthcare Providers: IncredibleEmployment.be  This test is not yet approved or cleared by the [place] FDA and  has been authorized for detection and/or diagnosis of SARS-CoV-2 by FDA under an Emergency Use Authorization (EUA).  This EUA will remain in effect (meaning this  test can be used) for the duration of  the COVID-19 declaration under Section 564(b)(1) of the Act, 21 U.S.C. section 360bbb-3(b)(1), unless the authorization is terminated or revoked sooner.     Influenza A by PCR NEGATIVE NEGATIVE Final   Influenza B by PCR NEGATIVE NEGATIVE Final    Comment: (NOTE) The Xpert Xpress SARS-CoV-2/FLU/RSV plus assay is intended as an aid in the diagnosis of influenza from Nasopharyngeal swab specimens and should not be used as a sole basis for treatment. Nasal washings and aspirates are unacceptable for Xpert Xpress SARS-CoV-2/FLU/RSV testing.  Fact Sheet for Patients: EntrepreneurPulse.com.au  Fact Sheet for Healthcare Providers: IncredibleEmployment.be  This test is not yet approved or cleared by the [place] FDA and has been authorized for detection and/or diagnosis of SARS-CoV-2 by FDA under an Emergency Use Authorization (EUA). This EUA will remain in effect (meaning this test can be used) for the duration of the COVID-19 declaration under Section 564(b)(1) of the Act, 21 U.S.C. section 360bbb-3(b)(1), unless the authorization is terminated or revoked.  Performed at [facility], [place] [address] [place], [place] [address]      Radiology Studies: CT ABDOMEN PELVIS W CONTRAST  Result Date: 09/19/2020 CLINICAL DATA:  Abdominal pain for 3 weeks. Ovarian cancer. Rule out bowel  obstruction. EXAM: CT ABDOMEN AND PELVIS WITH CONTRAST TECHNIQUE: Multidetector CT imaging of the abdomen and pelvis was performed using the standard protocol following bolus administration of intravenous contrast. CONTRAST:  146mL OMNIPAQUE IOHEXOL 300 MG/ML  SOLN COMPARISON:  09/07/2020 FINDINGS: Lower chest: No acute abnormality. Hepatobiliary: No focal liver abnormality is seen. Status post cholecystectomy. No biliary dilatation. Pancreas: Unremarkable. No pancreatic ductal dilatation or surrounding inflammatory changes. Spleen: Normal in size without focal abnormality. Adrenals/Urinary Tract: Normal adrenal glands. No kidney mass or hydronephrosis identified.  Urinary bladder is unremarkable. Stomach/Bowel: Stomach is within normal limits. Appendix appears normal. Similar appearance of soft tissue thickening along the serosal surface of small and large bowel loops consistent with peritoneal disease. Subjectively this is similar to the previous exam. Vascular/Lymphatic: Normal appearance of the abdominal aorta. No aneurysm. No abdominopelvic adenopathy identified. Reproductive: Status post hysterectomy. No adnexal masses. Other: There is no ascites. Soft tissue thickening is again noted along the peritoneal reflections within the abdomen and pelvis compatible with known peritoneal disease. The appearance is similar to the previous exam. Interloop soft tissue thickening with tethering of pelvic small bowel loops is difficult to quantify in compared with previous exam reflecting lack of enteric contrast material. Previously described soft tissue thickening along the serosal surface of the proximal transverse colon is similar to previous exam, image 45/5. Recently characterized increased soft tissue thickening along the surface of the jejunal bowel loops is difficult to visualize, also reflecting lack of IV contrast material. Musculoskeletal: No acute or significant osseous findings. IMPRESSION: 1. No acute findings  identified within the abdomen or pelvis. No evidence for bowel obstruction. 2. Similar appearance of soft tissue thickening along the peritoneal reflections within the abdomen and pelvis compatible with known peritoneal disease. Recently characterized increased soft tissue along the surface of the jejunal bowel loops and tethering of pelvic small bowel loops is difficult to quantify (and compared with previous exam) due to lack of IV contrast material. Electronically Signed   By: Kerby Moors M.D.   On: 09/19/2020 14:15   CT ABDOMEN PELVIS W CONTRAST  Final Result      Scheduled Meds: . Chlorhexidine Gluconate Cloth  6 each Topical Daily  . enoxaparin (LOVENOX) injection  40 mg Subcutaneous Q24H  . feeding supplement  237 mL Oral BID BM  . multivitamin with minerals  1 tablet Oral Daily   PRN Meds: acetaminophen **OR** acetaminophen, HYDROmorphone (DILAUDID) injection, [START ON 09/23/2020] ondansetron **OR** [START ON 09/23/2020] ondansetron (ZOFRAN) IV, prochlorperazine Continuous Infusions: . sodium chloride Stopped (09/19/20 1956)  . sodium chloride 75 mL/hr at 09/20/20 1500     LOS: 1 day  Time spent: Greater than 50% of the 35 minute visit was spent in counseling/coordination of care for the patient as laid out in the A&P.   Dwyane Dee, MD [facility] 09/20/2020, 3:30 PM

## 2020-09-20 NOTE — Progress Notes (Signed)
Patient received Aloxi as a premed for chemotherapy.  Currently has prn Zofran available.  Pharmacy notified and Zofran placed on hold for 72 hours.  Patient has prn Compazine if needed for nausea/vomiting.  Patient was educated on this.  Zandra Abts [facility]  09/20/2020  3:25 PM

## 2020-09-20 NOTE — Assessment & Plan Note (Addendum)
-  Patient endorses last bowel movement approximately 2 days PTA.  - fiber diet recommended and laxatives as needed

## 2020-09-20 NOTE — Progress Notes (Signed)
Patient due to receive 50 mg IV Benadryl as pre-med for chemotherapy.  Patient stated in the past, 50 mg was too much and caused severe restless legs.  She stated that dose had to be reduced to 25 mg.  Notified oncology pharmacist Raul Del and okay to reduce dose to 25 mg.  Zandra Abts [facility]  09/20/2020  10:25 AM

## 2020-09-20 NOTE — Hospital Course (Addendum)
[name] is a 45 yo female with PMH ovarian cancer now with abdominal carcinomatosis, asthma, obesity, PUD, steroid-induced diabetes who presented to the hospital with worsening left upper quadrant pain and nausea/vomiting.  She is followed closely by oncology outpatient and there were plans for starting chemotherapy the end of this month due to recent COVID-19 infection at the beginning of February. Due to her significant change in symptoms she was referred to the ER and underwent CT abdomen/pelvis.  This showed abdominal carcinomatosis and she was recommended for admission per oncology with plans to initiate chemotherapy inpatient.  She is to be started on carboplatin and gemcitabine. She is asymptomatic from COVID-19 and remains on room air.  Initial positive test was noted to be 09/08/2020 and she received MAB treatment.  She tolerated initiation of chemotherapy well on 09/20/2020.  Her symptoms rapidly improved and she was able to tolerate a diet prior to discharge.  She also had a bowel movement during hospitalization.  She has pain and nausea medication already at home and was in no further need of more medications.  She was followed by oncology during hospitalization and considered stable for discharging home with outpatient management for further chemo as planned. Patient was amenable for discharging home.

## 2020-09-20 NOTE — Assessment & Plan Note (Addendum)
-  Baseline hemoglobin approximately 10 to 11 g/dL. Marland Kitchen - remained at baseline; Hgb 10.5 g/dL at discharge

## 2020-09-20 NOTE — Progress Notes (Signed)
Patient tolerated chemotherapy without any issues.  Sarah Newman [facility]  09/20/2020  2:59 PM

## 2020-09-20 NOTE — Progress Notes (Signed)
Patient teaching performed on chemotherapy drugs (Carboplatin and Gemcitabine).  Patient has received Carboplatin in the past but Gemcitabine is new for her.  Drug information sheet given and reviewed using teach back method.  All questions answered. Patient signed chemotherapy consent form and it was placed in shadow chart. Zandra Abts [facility]  09/20/2020

## 2020-09-20 NOTE — Assessment & Plan Note (Addendum)
-  Diagnosed December 2020.  Followed by oncology outpatient - s/p ex lap with TAH-BSO, infra-gastric omentectomy on 07/21/2019 -Now with abdominal carcinomatosis on CT abdomen/pelvis on 09/19/2020.  Recommended for admission for initiating chemo inpatient.  Started on carboplatin and gemcitabine (D1, 09/20/20) per oncology - expect nadir's around day 21 - stable for d/c home on 2/18 with ongoing outpt management for treatment

## 2020-09-21 DIAGNOSIS — R110 Nausea: Secondary | ICD-10-CM | POA: Diagnosis not present

## 2020-09-21 DIAGNOSIS — C762 Malignant neoplasm of abdomen: Secondary | ICD-10-CM | POA: Diagnosis not present

## 2020-09-21 DIAGNOSIS — C562 Malignant neoplasm of left ovary: Secondary | ICD-10-CM | POA: Diagnosis not present

## 2020-09-21 DIAGNOSIS — R109 Unspecified abdominal pain: Secondary | ICD-10-CM | POA: Diagnosis not present

## 2020-09-21 LAB — CBC WITH DIFFERENTIAL/PLATELET
Abs Immature Granulocytes: 0.03 10*3/uL (ref 0.00–0.07)
Basophils Absolute: 0 10*3/uL (ref 0.0–0.1)
Basophils Relative: 0 %
Eosinophils Absolute: 0 10*3/uL (ref 0.0–0.5)
Eosinophils Relative: 0 %
HCT: 32 % — ABNORMAL LOW (ref 36.0–46.0)
Hemoglobin: 10.5 g/dL — ABNORMAL LOW (ref 12.0–15.0)
Immature Granulocytes: 1 %
Lymphocytes Relative: 12 %
Lymphs Abs: 0.7 10*3/uL (ref 0.7–4.0)
MCH: 28.5 pg (ref 26.0–34.0)
MCHC: 32.8 g/dL (ref 30.0–36.0)
MCV: 86.7 fL (ref 80.0–100.0)
Monocytes Absolute: 0.1 10*3/uL (ref 0.1–1.0)
Monocytes Relative: 2 %
Neutro Abs: 5.1 10*3/uL (ref 1.7–7.7)
Neutrophils Relative %: 85 %
Platelets: 332 10*3/uL (ref 150–400)
RBC: 3.69 MIL/uL — ABNORMAL LOW (ref 3.87–5.11)
RDW: 11.6 % (ref 11.5–15.5)
WBC: 5.9 10*3/uL (ref 4.0–10.5)
nRBC: 0 % (ref 0.0–0.2)

## 2020-09-21 LAB — HIV ANTIBODY (ROUTINE TESTING W REFLEX): HIV Screen 4th Generation wRfx: NONREACTIVE

## 2020-09-21 LAB — COMPREHENSIVE METABOLIC PANEL
ALT: 40 U/L (ref 0–44)
AST: 26 U/L (ref 15–41)
Albumin: 3.8 g/dL (ref 3.5–5.0)
Alkaline Phosphatase: 94 U/L (ref 38–126)
Anion gap: 10 (ref 5–15)
BUN: 6 mg/dL (ref 6–20)
CO2: 26 mmol/L (ref 22–32)
Calcium: 9.4 mg/dL (ref 8.9–10.3)
Chloride: 108 mmol/L (ref 98–111)
Creatinine, Ser: 0.49 mg/dL (ref 0.44–1.00)
GFR, Estimated: >60 mL/min (ref >60)
Glucose, Bld: 151 mg/dL — ABNORMAL HIGH (ref 70–99)
Potassium: 4 mmol/L (ref 3.5–5.1)
Sodium: 144 mmol/L (ref 135–145)
Total Bilirubin: 0.7 mg/dL (ref 0.3–1.2)
Total Protein: 7.3 g/dL (ref 6.5–8.1)

## 2020-09-21 LAB — MAGNESIUM: Magnesium: 2 mg/dL (ref 1.7–2.4)

## 2020-09-21 LAB — GLUCOSE, CAPILLARY: Glucose-Capillary: 131 mg/dL — ABNORMAL HIGH (ref 70–99)

## 2020-09-21 MED ORDER — HEPARIN SOD (PORK) LOCK FLUSH 100 UNIT/ML IV SOLN
500.0000 [IU] | INTRAVENOUS | Status: AC | PRN
  Administered 2020-09-21: 500 [IU]
  Filled 2020-09-21: qty 5

## 2020-09-21 NOTE — Discharge Instructions (Signed)
High-Fiber Eating Plan Fiber, also called dietary fiber, is a type of carbohydrate. It is found foods such as fruits, vegetables, whole grains, and beans. A high-fiber diet can have many health benefits. Your health care provider may recommend a high-fiber diet to help:  Prevent constipation. Fiber can make your bowel movements more regular.  Lower your cholesterol.  Relieve the following conditions: ? Inflammation of veins in the anus (hemorrhoids). ? Inflammation of specific areas of the digestive tract (uncomplicated diverticulosis). ? A problem of the large intestine, also called the colon, that sometimes causes pain and diarrhea (irritable bowel syndrome, or IBS).  Prevent overeating as part of a weight-loss plan.  Prevent heart disease, type 2 diabetes, and certain cancers. What are tips for following this plan? Reading food labels  Check the nutrition facts label on food products for the amount of dietary fiber. Choose foods that have 5 grams of fiber or more per serving.  The goals for recommended daily fiber intake include: ? Men (age 50 or younger): 34-38 g. ? Men (over age 50): 28-34 g. ? Women (age 50 or younger): 25-28 g. ? Women (over age 50): 22-25 g. Your daily fiber goal is _____________ g.   Shopping  Choose whole fruits and vegetables instead of processed forms, such as apple juice or applesauce.  Choose a wide variety of high-fiber foods such as avocados, lentils, oats, and kidney beans.  Read the nutrition facts label of the foods you choose. Be aware of foods with added fiber. These foods often have high sugar and sodium amounts per serving. Cooking  Use whole-grain flour for baking and cooking.  Cook with brown rice instead of white rice. Meal planning  Start the day with a breakfast that is high in fiber, such as a cereal that contains 5 g of fiber or more per serving.  Eat breads and cereals that are made with whole-grain flour instead of refined  flour or white flour.  Eat brown rice, bulgur wheat, or millet instead of white rice.  Use beans in place of meat in soups, salads, and pasta dishes.  Be sure that half of the grains you eat each day are whole grains. General information  You can get the recommended daily intake of dietary fiber by: ? Eating a variety of fruits, vegetables, grains, nuts, and beans. ? Taking a fiber supplement if you are not able to take in enough fiber in your diet. It is better to get fiber through food than from a supplement.  Gradually increase how much fiber you consume. If you increase your intake of dietary fiber too quickly, you may have bloating, cramping, or gas.  Drink plenty of water to help you digest fiber.  Choose high-fiber snacks, such as berries, raw vegetables, nuts, and popcorn. What foods should I eat? Fruits Berries. Pears. Apples. Oranges. Avocado. Prunes and raisins. Dried figs. Vegetables Sweet potatoes. Spinach. Kale. Artichokes. Cabbage. Broccoli. Cauliflower. Green peas. Carrots. Squash. Grains Whole-grain breads. Multigrain cereal. Oats and oatmeal. Brown rice. Barley. Bulgur wheat. Millet. Quinoa. Bran muffins. Popcorn. Rye wafer crackers. Meats and other proteins Navy beans, kidney beans, and pinto beans. Soybeans. Split peas. Lentils. Nuts and seeds. Dairy Fiber-fortified yogurt. Beverages Fiber-fortified soy milk. Fiber-fortified orange juice. Other foods Fiber bars. The items listed above may not be a complete list of recommended foods and beverages. Contact a dietitian for more information. What foods should I avoid? Fruits Fruit juice. Cooked, strained fruit. Vegetables Fried potatoes. Canned vegetables. Well-cooked vegetables. Grains   White bread. Pasta made with refined flour. White rice. Meats and other proteins Fatty cuts of meat. Fried chicken or fried fish. Dairy Milk. Yogurt. Cream cheese. Sour cream. Fats and oils Butters. Beverages Soft  drinks. Other foods Cakes and pastries. The items listed above may not be a complete list of foods and beverages to avoid. Talk with your dietitian about what choices are best for you. Summary  Fiber is a type of carbohydrate. It is found in foods such as fruits, vegetables, whole grains, and beans.  A high-fiber diet has many benefits. It can help to prevent constipation, lower blood cholesterol, aid weight loss, and reduce your risk of heart disease, diabetes, and certain cancers.  Increase your intake of fiber gradually. Increasing fiber too quickly may cause cramping, bloating, and gas. Drink plenty of water while you increase the amount of fiber you consume.  The best sources of fiber include whole fruits and vegetables, whole grains, nuts, seeds, and beans. This information is not intended to replace advice given to you by your health care provider. Make sure you discuss any questions you have with your health care provider. Document Revised: 11/24/2019 Document Reviewed: 11/24/2019 Elsevier Patient Education  2021 [employer] Inc.  

## 2020-09-21 NOTE — Progress Notes (Signed)
Pt being discharged to home. Discharge instructions and medication education provided to pt. Provided pt with diet plan for high fiber carb modified diet.

## 2020-09-21 NOTE — Progress Notes (Signed)
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 The patient is in life-threatening risk of bowel obstruction She tolerated cycle 1 of carboplatin and gemcitabine well yesterday on 09/20/2020 Overall, her symptoms are improved She will return to the cancer center on February 28 for cycle 1 day 8 of therapy  Abdominal carcinomatosis with abdominal pain, nausea and inability to tolerate oral intake, resolved Most of her symptoms has resolved She is able to tolerate oral intake without nausea vomiting She had regular bowel movement recently The patient felt comfortable going home She will continue aggressive laxative therapy and hydration as tolerated at home  Code Status Full code  Asymptomatic COVID infection She remains on isolation She received monoclonal antibody treatment recently Observe for now  Goals of care Resolution of bowel obstructive symptoms This is achieved today  Discharge planning She can be discharged today She will return to the cancer center on February 28 to resume treatment  All questions were answered. The patient knows to call the clinic with any problems, questions or concerns.   Heath Lark, MD 09/21/2020 8:00 AM  Subjective:  -She is seen today She tolerated chemotherapy well She is able to tolerate oral intake without nausea or vomiting She had regular bowel movement She denies abdominal pain  Objective:  Vitals:   09/20/20 2037 09/21/20 0342  BP: 121/85 130/80  Pulse: 84 (!) 58  Resp: 15 18  Temp:  98 F (36.7 C)  SpO2: 100% 100%     Intake/Output Summary (Last 24 hours) at 09/21/2020 0800 Last data filed at 09/20/2020 1500 Gross per 24 hour  Intake 1636.01 ml  Output --  Net 1636.01 ml    GENERAL:alert, no distress and comfortable NEURO: alert & oriented x 3 with fluent speech, no focal motor/sensory deficits   Labs:  Recent Labs     01/05/20 0840 02/17/20 0850 04/05/20 1008 05/29/20 1129 09/19/20 1118 09/20/20 0952 09/21/20 0430  NA 140 139 138   < > 140 139 144  K 3.8 3.4* 3.6   < > 3.6 3.4* 4.0  CL 105 105 102   < > 102 104 108  CO2 24 24 27    < > 27 25 26   GLUCOSE 114* 111* 122*   < > 121* 100* 151*  BUN 8 11 9    < > 8 <5* 6  CREATININE 0.73 0.66 0.68   < > 0.66 0.50 0.49  CALCIUM 9.3 9.5 10.1   < > 9.6 8.8* 9.4  GFRNONAA >60 >60 >60   < > >60 >60 >60  GFRAA >60 >60 >60  --   --   --   --   PROT 7.4 7.4 7.5   < > 7.9 6.5 7.3  ALBUMIN 3.8 3.9 3.8   < > 4.1 3.5 3.8  AST 28 36 42*   < > 39 25 26  ALT 31 39 41   < > 48* 34 40  ALKPHOS 98 88 96   < > 101 82 94  BILITOT 0.3 0.4 0.5   < > 0.6 0.6 0.7   < > = values in this interval not displayed.    Studies:  CT ABDOMEN PELVIS W CONTRAST  Result Date: 09/19/2020 CLINICAL DATA:  Abdominal pain for 3 weeks. Ovarian cancer. Rule out bowel obstruction. EXAM: CT ABDOMEN AND PELVIS WITH CONTRAST TECHNIQUE: Multidetector CT imaging of the abdomen and pelvis was performed using the  standard protocol following bolus administration of intravenous contrast. CONTRAST:  172mL OMNIPAQUE IOHEXOL 300 MG/ML  SOLN COMPARISON:  09/07/2020 FINDINGS: Lower chest: No acute abnormality. Hepatobiliary: No focal liver abnormality is seen. Status post cholecystectomy. No biliary dilatation. Pancreas: Unremarkable. No pancreatic ductal dilatation or surrounding inflammatory changes. Spleen: Normal in size without focal abnormality. Adrenals/Urinary Tract: Normal adrenal glands. No kidney mass or hydronephrosis identified. Urinary bladder is unremarkable. Stomach/Bowel: Stomach is within normal limits. Appendix appears normal. Similar appearance of soft tissue thickening along the serosal surface of small and large bowel loops consistent with peritoneal disease. Subjectively this is similar to the previous exam. Vascular/Lymphatic: Normal appearance of the abdominal aorta. No aneurysm. No  abdominopelvic adenopathy identified. Reproductive: Status post hysterectomy. No adnexal masses. Other: There is no ascites. Soft tissue thickening is again noted along the peritoneal reflections within the abdomen and pelvis compatible with known peritoneal disease. The appearance is similar to the previous exam. Interloop soft tissue thickening with tethering of pelvic small bowel loops is difficult to quantify in compared with previous exam reflecting lack of enteric contrast material. Previously described soft tissue thickening along the serosal surface of the proximal transverse colon is similar to previous exam, image 45/5. Recently characterized increased soft tissue thickening along the surface of the jejunal bowel loops is difficult to visualize, also reflecting lack of IV contrast material. Musculoskeletal: No acute or significant osseous findings. IMPRESSION: 1. No acute findings identified within the abdomen or pelvis. No evidence for bowel obstruction. 2. Similar appearance of soft tissue thickening along the peritoneal reflections within the abdomen and pelvis compatible with known peritoneal disease. Recently characterized increased soft tissue along the surface of the jejunal bowel loops and tethering of pelvic small bowel loops is difficult to quantify (and compared with previous exam) due to lack of IV contrast material. Electronically Signed   By: Kerby Moors M.D.   On: 09/19/2020 14:15   CT CHEST ABDOMEN PELVIS W CONTRAST  Result Date: 09/07/2020 CLINICAL DATA:  Restaging ovarian cancer. EXAM: CT CHEST, ABDOMEN, AND PELVIS WITH CONTRAST TECHNIQUE: Multidetector CT imaging of the chest, abdomen and pelvis was performed following the standard protocol during bolus administration of intravenous contrast. CONTRAST:  182mL OMNIPAQUE IOHEXOL 300 MG/ML  SOLN COMPARISON:  CT AP 07/16/2020.  CT CAP 01/05/2020 FINDINGS: CT CHEST FINDINGS Cardiovascular: Heart size appears within normal limits. No  pericardial effusion. Mediastinum/Nodes: Normal appearance of the thyroid gland. The trachea appears patent and is midline. Normal appearance of the esophagus. No enlarged lymph nodes. Lungs/Pleura: Bilateral lower lobe calcified granulomas. No suspicious lung nodules. Musculoskeletal: No chest wall mass or suspicious bone lesions identified. CT ABDOMEN PELVIS FINDINGS Hepatobiliary: No focal liver abnormality is seen. Status post cholecystectomy. No biliary dilatation. Pancreas: Unremarkable. No pancreatic ductal dilatation or surrounding inflammatory changes. Spleen: Normal in size without focal abnormality. Adrenals/Urinary Tract: Normal adrenal glands. No kidney mass or hydronephrosis identified. Urinary bladder is unremarkable. Stomach/Bowel: The stomach appears normal. The appendix is visualized and is within normal limits. No pathologic dilatation of the large or small bowel loops. Soft tissue thickening along the serosal surface of the small and large bowel loops is again identified compatible with peritoneal disease. Subjectively this appears increased from the previous exam from 07/16/2020 Vascular/Lymphatic: No aneurysm.  No abdominopelvic adenopathy. Reproductive: Status post hysterectomy. No adnexal masses. Other: Moderate diffuse soft tissue thickening is identified along the peritoneal surface of the abdomen and pelvis. Multifocal areas of serosal involvement of the large and small bowel noted within  the abdomen and pelvis. -For example, within the pelvis there is focal area interloop soft tissue with tethering of pelvic small bowel loops, image 72/5. Here central area of increased soft tissue measures 1.9 x 1.5 cm, image 72/5. Previously this area measured 1.8 x 1.4 cm. -Within the right abdomen there is progressive soft tissue along the serosal surface of the proximal transverse colon, image 44/5. -Within the central abdomen there is increased soft tissue thickening along the surface of the jejunal  bowel loops, image 38/5. Musculoskeletal: No acute or significant osseous findings. IMPRESSION: 1. Interval progression of peritoneal disease within the abdomen and pelvis. Tumor predominantly involves the serosal surface of the large and small bowel loops. No signs of bowel obstruction identified at this time. 2. No evidence for metastatic disease to the chest. Electronically Signed   By: Kerby Moors M.D.   On: 09/07/2020 10:21

## 2020-09-21 NOTE — Discharge Summary (Signed)
Physician Discharge Summary   Sarah Newman PXT:[id number] DOB: 1976/05/26 DOA: 09/19/2020  PCP: Heath Lark, MD  Admit date: 09/19/2020 Discharge date: 09/21/2020  Admitted From: home Disposition:  home Discharging physician: Dwyane Dee, MD  Recommendations for Outpatient Follow-up:  1. Follow up with oncology  Patient discharged to home in Discharge Condition: stable Risk of unplanned readmission score: Unplanned Admission- Pilot do not use: 15.38  CODE STATUS: Full Diet recommendation:  Diet Orders (From admission, onward)    Start     Ordered   09/20/20 1242  DIET SOFT Room service appropriate? Yes; Fluid consistency: Thin  Diet effective now       Question Answer Comment  Room service appropriate? Yes   Fluid consistency: Thin      09/20/20 1241          Hospital Course: Sarah Newman is a 45 yo female with PMH ovarian cancer now with abdominal carcinomatosis, asthma, obesity, PUD, steroid-induced diabetes who presented to the hospital with worsening left upper quadrant pain and nausea/vomiting.  She is followed closely by oncology outpatient and there were plans for starting chemotherapy the end of this month due to recent COVID-19 infection at the beginning of February. Due to her significant change in symptoms she was referred to the ER and underwent CT abdomen/pelvis.  This showed abdominal carcinomatosis and she was recommended for admission per oncology with plans to initiate chemotherapy inpatient.  She is to be started on carboplatin and gemcitabine. She is asymptomatic from COVID-19 and remains on room air.  Initial positive test was noted to be 09/08/2020 and she received MAB treatment.  She tolerated initiation of chemotherapy well on 09/20/2020.  Her symptoms rapidly improved and she was able to tolerate a diet prior to discharge.  She also had a bowel movement during hospitalization.  She has pain and nausea medication already at home and was in no further  need of more medications.  She was followed by oncology during hospitalization and considered stable for discharging home with outpatient management for further chemo as planned. Patient was amenable for discharging home.    * Abdominal carcinomatosis ([place]) -Due to progression of ovarian cancer.  Seen on CT abdomen/pelvis on admission -Patient undergoing urgent chemo inpatient; tolerated well on 09/20/2020 -Plan is for outpatient ongoing chemo -Pain and nausea/vomiting was resolved prior to discharge  Malignant neoplasm of left ovary [facility] [place]) -Diagnosed December 2020.  Followed by oncology outpatient - s/p ex lap with TAH-BSO, infra-gastric omentectomy on 07/21/2019 -Now with abdominal carcinomatosis on CT abdomen/pelvis on 09/19/2020.  Recommended for admission for initiating chemo inpatient.  Started on carboplatin and gemcitabine (D1, 09/20/20) per oncology - expect nadir's around day 21 - stable for d/c home on 2/18 with ongoing outpt management for treatment   Constipation -Patient endorses last bowel movement approximately 2 days PTA.  - fiber diet recommended and laxatives as needed  Steroid-induced diabetes ([place]) -On Metformin at home -Continue CBG monitoring; if becomes elevated, will add on SSI -Follow-up A1c = 6.3%  Lab test positive for detection of COVID-19 virus -Initially positive on 09/08/2020 -Treated with MAB outpatient -Currently no symptoms and remains on room air  Hypokalemia - Resolved with repletion  Anemia, chronic disease -Baseline hemoglobin approximately 10 to 11 g/dL. Marland Kitchen - remained at baseline; Hgb 10.5 g/dL at discharge     The patient's chronic medical conditions were treated accordingly per the patient's home medication regimen except as noted.  On day of discharge, patient was felt deemed stable for  discharge. Patient/family member advised to call PCP or come back to ER if needed.   Principal Diagnosis: Abdominal carcinomatosis [facility])  Discharge  Diagnoses: Active Hospital Problems   Diagnosis Date Noted  . Abdominal carcinomatosis ([place]) 09/19/2020    Priority: High  . 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[facility])     Priority: High  . Steroid-induced diabetes ([place]) 09/20/2020  . Constipation 09/20/2020  . Lab test positive for detection of COVID-19 virus 09/10/2020  . Hypokalemia 08/30/2020  . Anemia, chronic disease 05/29/2020    Resolved Hospital Problems  No resolved problems to display.    Discharge Instructions    Increase activity slowly   Complete by: As directed      Allergies as of 09/21/2020   No Known Allergies     Medication List    TAKE these medications   acetaminophen 500 MG tablet Commonly known as: TYLENOL Take 1,000 mg by mouth every 6 (six) hours as needed for moderate pain.   famotidine 20 MG tablet Commonly known as: PEPCID Take 1 tablet (20 mg total) by mouth 2 (two) times daily.   lidocaine-prilocaine cream Commonly known as: EMLA Apply to affected area once What changed:   how much to take  how to take this  when to take this  additional instructions   metFORMIN 500 MG tablet Commonly known as: GLUCOPHAGE TAKE 1 TABLET BY MOUTH TWICE DAILY WITH MEALS   morphine 15 MG tablet Commonly known as: MSIR Take 1 tablet (15 mg total) by mouth every 4 (four) hours as needed for severe pain.   multivitamin with minerals Tabs tablet Take 1 tablet by mouth daily.   ondansetron 8 MG tablet Commonly known as: ZOFRAN Take 8 mg by mouth every 8 (eight) hours as needed for nausea or vomiting.   pantoprazole 40 MG tablet Commonly known as: PROTONIX Take 1 tablet (40 mg total) by mouth 2 (two) times daily.   promethazine 25 MG tablet Commonly known as: PHENERGAN Take 25 mg by mouth every 6 (six) hours as needed for nausea or vomiting.   sucralfate 1 g tablet Commonly known as: Carafate Take 1 tablet (1 g total) by mouth 3 (three) times daily.       No Known  Allergies  Consultations: Oncology  Discharge Exam: BP 130/80   Pulse (!) 58   Temp 98 F (36.7 C)   Resp 18   Ht 5\' 4"  (1.626 m)   Wt 95.4 kg   LMP 07/05/2019   SpO2 100%   BMI 36.10 kg/m  General appearance: alert, cooperative and no distress Head: Normocephalic, without obvious abnormality, atraumatic Eyes: EOMI Lungs: clear to auscultation bilaterally Heart: regular rate and rhythm and S1, S2 normal Abdomen: soft, ND, BS present, obese Extremities: no edema Skin: mobility and turgor normal Neurologic: Grossly normal  The results of significant diagnostics from this hospitalization (including imaging, microbiology, ancillary and laboratory) are listed below for reference.   Microbiology: Recent Results (from the past 240 hour(s))  Resp Panel by RT-PCR (Flu A&B, Covid) Nasopharyngeal Swab     Status: Abnormal   Collection Time: 09/19/20  3:55 PM   Specimen: Nasopharyngeal Swab; Nasopharyngeal(NP) swabs in vial transport medium  Result Value Ref Range Status   SARS Coronavirus 2 by RT PCR POSITIVE (A) NEGATIVE Final    Comment: RESULT CALLED TO, READ BACK BY AND VERIFIED WITH: JESSICA LOWDERMILK @ 1939 ON 09/19/20 C VARNER (NOTE) SARS-CoV-2 target nucleic acids are DETECTED.  The SARS-CoV-2 RNA is generally  detectable in upper respiratory specimens during the acute phase of infection. Positive results are indicative of the presence of the identified virus, but do not rule out bacterial infection or co-infection with other pathogens not detected by the test. Clinical correlation with patient history and other diagnostic information is necessary to determine patient infection status. The expected result is Negative.  Fact Sheet for Patients: EntrepreneurPulse.com.au  Fact Sheet for Healthcare Providers: IncredibleEmployment.be  This test is not yet approved or cleared by the [place] FDA and  has been authorized for  detection and/or diagnosis of SARS-CoV-2 by FDA under an Emergency Use Authorization (EUA).  This EUA will remain in effect (meaning this  test can be used) for the duration of  the COVID-19 declaration under Section 564(b)(1) of the Act, 21 U.S.C. section 360bbb-3(b)(1), unless the authorization is terminated or revoked sooner.     Influenza A by PCR NEGATIVE NEGATIVE Final   Influenza B by PCR NEGATIVE NEGATIVE Final    Comment: (NOTE) The Xpert Xpress SARS-CoV-2/FLU/RSV plus assay is intended as an aid in the diagnosis of influenza from Nasopharyngeal swab specimens and should not be used as a sole basis for treatment. Nasal washings and aspirates are unacceptable for Xpert Xpress SARS-CoV-2/FLU/RSV testing.  Fact Sheet for Patients: EntrepreneurPulse.com.au  Fact Sheet for Healthcare Providers: IncredibleEmployment.be  This test is not yet approved or cleared by the [place] FDA and has been authorized for detection and/or diagnosis of SARS-CoV-2 by FDA under an Emergency Use Authorization (EUA). This EUA will remain in effect (meaning this test can be used) for the duration of the COVID-19 declaration under Section 564(b)(1) of the Act, 21 U.S.C. section 360bbb-3(b)(1), unless the authorization is terminated or revoked.  Performed at [facility], [place] [address], [place], [place] [address]      Labs: BNP (last 3 results) No results for input(s): BNP in the last 8760 hours. Basic Metabolic Panel: Recent Labs  Lab 09/19/20 1118 09/20/20 0952 09/21/20 0430  NA 140 139 144  K 3.6 3.4* 4.0  CL 102 104 108  CO2 27 25 26   GLUCOSE 121* 100* 151*  BUN 8 <5* 6  CREATININE 0.66 0.50 0.49  CALCIUM 9.6 8.8* 9.4  MG  --   --  2.0   Liver Function Tests: Recent Labs  Lab 09/19/20 1118 09/20/20 0952 09/21/20 0430  AST 39 25 26  ALT 48* 34 40  ALKPHOS 101 82 94  BILITOT 0.6 0.6 0.7  PROT 7.9 6.5 7.3   ALBUMIN 4.1 3.5 3.8   Recent Labs  Lab 09/19/20 1118  LIPASE 36   No results for input(s): AMMONIA in the last 168 hours. CBC: Recent Labs  Lab 09/19/20 1118 09/20/20 0952 09/21/20 0430  WBC 6.5 5.0 5.9  NEUTROABS 4.3  --  5.1  HGB 10.9* 9.9* 10.5*  HCT 33.6* 30.9* 32.0*  MCV 88.7 89.3 86.7  PLT 342 303 332   Cardiac Enzymes: No results for input(s): CKTOTAL, CKMB, CKMBINDEX, TROPONINI in the last 168 hours. BNP: Invalid input(s): POCBNP CBG: Recent Labs  Lab 09/20/20 0549 09/20/20 1209 09/20/20 1755 09/20/20 2309 09/21/20 0540  GLUCAP 104* 118* 189* 193* 131*   D-Dimer No results for input(s): DDIMER in the last 72 hours. Hgb A1c Recent Labs    09/19/20 1836  HGBA1C 6.3*   Lipid Profile No results for input(s): CHOL, HDL, LDLCALC, TRIG, CHOLHDL, LDLDIRECT in the last 72 hours. Thyroid function studies No results for input(s): TSH, T4TOTAL, T3FREE,  THYROIDAB in the last 72 hours.  Invalid input(s): FREET3 Anemia work up No results for input(s): VITAMINB12, FOLATE, FERRITIN, TIBC, IRON, RETICCTPCT in the last 72 hours. Urinalysis    Component Value Date/Time   COLORURINE YELLOW 09/19/2020 1710   APPEARANCEUR CLEAR 09/19/2020 1710   LABSPEC >1.046 (H) 09/19/2020 1710   PHURINE 6.0 09/19/2020 1710   GLUCOSEU NEGATIVE 09/19/2020 1710   HGBUR NEGATIVE 09/19/2020 1710   BILIRUBINUR NEGATIVE 09/19/2020 1710   KETONESUR NEGATIVE 09/19/2020 1710   PROTEINUR NEGATIVE 09/19/2020 1710   UROBILINOGEN 0.2 02/15/2015 2140   NITRITE NEGATIVE 09/19/2020 1710   LEUKOCYTESUR NEGATIVE 09/19/2020 1710   Sepsis Labs Invalid input(s): PROCALCITONIN,  WBC,  LACTICIDVEN Microbiology Recent Results (from the past 240 hour(s))  Resp Panel by RT-PCR (Flu A&B, Covid) Nasopharyngeal Swab     Status: Abnormal   Collection Time: 09/19/20  3:55 PM   Specimen: Nasopharyngeal Swab; Nasopharyngeal(NP) swabs in vial transport medium  Result Value Ref Range Status   SARS  Coronavirus 2 by RT PCR POSITIVE (A) NEGATIVE Final    Comment: RESULT CALLED TO, READ BACK BY AND VERIFIED WITH: JESSICA LOWDERMILK @ 1939 ON 09/19/20 C VARNER (NOTE) SARS-CoV-2 target nucleic acids are DETECTED.  The SARS-CoV-2 RNA is generally detectable in upper respiratory specimens during the acute phase of infection. Positive results are indicative of the presence of the identified virus, but do not rule out bacterial infection or co-infection with other pathogens not detected by the test. Clinical correlation with patient history and other diagnostic information is necessary to determine patient infection status. The expected result is Negative.  Fact Sheet for Patients: EntrepreneurPulse.com.au  Fact Sheet for Healthcare Providers: IncredibleEmployment.be  This test is not yet approved or cleared by the [place] FDA and  has been authorized for detection and/or diagnosis of SARS-CoV-2 by FDA under an Emergency Use Authorization (EUA).  This EUA will remain in effect (meaning this  test can be used) for the duration of  the COVID-19 declaration under Section 564(b)(1) of the Act, 21 U.S.C. section 360bbb-3(b)(1), unless the authorization is terminated or revoked sooner.     Influenza A by PCR NEGATIVE NEGATIVE Final   Influenza B by PCR NEGATIVE NEGATIVE Final    Comment: (NOTE) The Xpert Xpress SARS-CoV-2/FLU/RSV plus assay is intended as an aid in the diagnosis of influenza from Nasopharyngeal swab specimens and should not be used as a sole basis for treatment. Nasal washings and aspirates are unacceptable for Xpert Xpress SARS-CoV-2/FLU/RSV testing.  Fact Sheet for Patients: EntrepreneurPulse.com.au  Fact Sheet for Healthcare Providers: IncredibleEmployment.be  This test is not yet approved or cleared by the [place] FDA and has been authorized for detection and/or diagnosis of  SARS-CoV-2 by FDA under an Emergency Use Authorization (EUA). This EUA will remain in effect (meaning this test can be used) for the duration of the COVID-19 declaration under Section 564(b)(1) of the Act, 21 U.S.C. section 360bbb-3(b)(1), unless the authorization is terminated or revoked.  Performed at [facility], [place] [address], [place], [place] [address]     Procedures/Studies: CT ABDOMEN PELVIS W CONTRAST  Result Date: 09/19/2020 CLINICAL DATA:  Abdominal pain for 3 weeks. Ovarian cancer. Rule out bowel obstruction. EXAM: CT ABDOMEN AND PELVIS WITH CONTRAST TECHNIQUE: Multidetector CT imaging of the abdomen and pelvis was performed using the standard protocol following bolus administration of intravenous contrast. CONTRAST:  155mL OMNIPAQUE IOHEXOL 300 MG/ML  SOLN COMPARISON:  09/07/2020 FINDINGS: Lower chest: No acute abnormality. Hepatobiliary: No  focal liver abnormality is seen. Status post cholecystectomy. No biliary dilatation. Pancreas: Unremarkable. No pancreatic ductal dilatation or surrounding inflammatory changes. Spleen: Normal in size without focal abnormality. Adrenals/Urinary Tract: Normal adrenal glands. No kidney mass or hydronephrosis identified. Urinary bladder is unremarkable. Stomach/Bowel: Stomach is within normal limits. Appendix appears normal. Similar appearance of soft tissue thickening along the serosal surface of small and large bowel loops consistent with peritoneal disease. Subjectively this is similar to the previous exam. Vascular/Lymphatic: Normal appearance of the abdominal aorta. No aneurysm. No abdominopelvic adenopathy identified. Reproductive: Status post hysterectomy. No adnexal masses. Other: There is no ascites. Soft tissue thickening is again noted along the peritoneal reflections within the abdomen and pelvis compatible with known peritoneal disease. The appearance is similar to the previous exam. Interloop soft tissue thickening with  tethering of pelvic small bowel loops is difficult to quantify in compared with previous exam reflecting lack of enteric contrast material. Previously described soft tissue thickening along the serosal surface of the proximal transverse colon is similar to previous exam, image 45/5. Recently characterized increased soft tissue thickening along the surface of the jejunal bowel loops is difficult to visualize, also reflecting lack of IV contrast material. Musculoskeletal: No acute or significant osseous findings. IMPRESSION: 1. No acute findings identified within the abdomen or pelvis. No evidence for bowel obstruction. 2. Similar appearance of soft tissue thickening along the peritoneal reflections within the abdomen and pelvis compatible with known peritoneal disease. Recently characterized increased soft tissue along the surface of the jejunal bowel loops and tethering of pelvic small bowel loops is difficult to quantify (and compared with previous exam) due to lack of IV contrast material. Electronically Signed   By: Kerby Moors M.D.   On: 09/19/2020 14:15   CT CHEST ABDOMEN PELVIS W CONTRAST  Result Date: 09/07/2020 CLINICAL DATA:  Restaging ovarian cancer. EXAM: CT CHEST, ABDOMEN, AND PELVIS WITH CONTRAST TECHNIQUE: Multidetector CT imaging of the chest, abdomen and pelvis was performed following the standard protocol during bolus administration of intravenous contrast. CONTRAST:  160mL OMNIPAQUE IOHEXOL 300 MG/ML  SOLN COMPARISON:  CT AP 07/16/2020.  CT CAP 01/05/2020 FINDINGS: CT CHEST FINDINGS Cardiovascular: Heart size appears within normal limits. No pericardial effusion. Mediastinum/Nodes: Normal appearance of the thyroid gland. The trachea appears patent and is midline. Normal appearance of the esophagus. No enlarged lymph nodes. Lungs/Pleura: Bilateral lower lobe calcified granulomas. No suspicious lung nodules. Musculoskeletal: No chest wall mass or suspicious bone lesions identified. CT ABDOMEN  PELVIS FINDINGS Hepatobiliary: No focal liver abnormality is seen. Status post cholecystectomy. No biliary dilatation. Pancreas: Unremarkable. No pancreatic ductal dilatation or surrounding inflammatory changes. Spleen: Normal in size without focal abnormality. Adrenals/Urinary Tract: Normal adrenal glands. No kidney mass or hydronephrosis identified. Urinary bladder is unremarkable. Stomach/Bowel: The stomach appears normal. The appendix is visualized and is within normal limits. No pathologic dilatation of the large or small bowel loops. Soft tissue thickening along the serosal surface of the small and large bowel loops is again identified compatible with peritoneal disease. Subjectively this appears increased from the previous exam from 07/16/2020 Vascular/Lymphatic: No aneurysm.  No abdominopelvic adenopathy. Reproductive: Status post hysterectomy. No adnexal masses. Other: Moderate diffuse soft tissue thickening is identified along the peritoneal surface of the abdomen and pelvis. Multifocal areas of serosal involvement of the large and small bowel noted within the abdomen and pelvis. -For example, within the pelvis there is focal area interloop soft tissue with tethering of pelvic small bowel loops, image 72/5. Here central area  of increased soft tissue measures 1.9 x 1.5 cm, image 72/5. Previously this area measured 1.8 x 1.4 cm. -Within the right abdomen there is progressive soft tissue along the serosal surface of the proximal transverse colon, image 44/5. -Within the central abdomen there is increased soft tissue thickening along the surface of the jejunal bowel loops, image 38/5. Musculoskeletal: No acute or significant osseous findings. IMPRESSION: 1. Interval progression of peritoneal disease within the abdomen and pelvis. Tumor predominantly involves the serosal surface of the large and small bowel loops. No signs of bowel obstruction identified at this time. 2. No evidence for metastatic disease to  the chest. Electronically Signed   By: Kerby Moors M.D.   On: 09/07/2020 10:21     Time coordinating discharge: Over 30 minutes    Dwyane Dee, MD  [facility] 09/21/2020, 3:07 PM

## 2020-09-27 ENCOUNTER — Other Ambulatory Visit (HOSPITAL_COMMUNITY): Payer: 59

## 2020-10-01 ENCOUNTER — Other Ambulatory Visit: Payer: Self-pay

## 2020-10-01 ENCOUNTER — Other Ambulatory Visit: Payer: Self-pay | Admitting: Hematology and Oncology

## 2020-10-01 ENCOUNTER — Inpatient Hospital Stay (HOSPITAL_BASED_OUTPATIENT_CLINIC_OR_DEPARTMENT_OTHER): Payer: 59 | Admitting: Hematology and Oncology

## 2020-10-01 ENCOUNTER — Inpatient Hospital Stay: Payer: 59

## 2020-10-01 ENCOUNTER — Encounter: Payer: Self-pay | Admitting: Hematology and Oncology

## 2020-10-01 VITALS — BP 144/98 | HR 84 | Temp 97.2°F | Resp 18 | Ht 64.0 in | Wt 208.4 lb

## 2020-10-01 DIAGNOSIS — D6481 Anemia due to antineoplastic chemotherapy: Secondary | ICD-10-CM

## 2020-10-01 DIAGNOSIS — G893 Neoplasm related pain (acute) (chronic): Secondary | ICD-10-CM

## 2020-10-01 DIAGNOSIS — T451X5A Adverse effect of antineoplastic and immunosuppressive drugs, initial encounter: Secondary | ICD-10-CM | POA: Diagnosis not present

## 2020-10-01 DIAGNOSIS — R109 Unspecified abdominal pain: Secondary | ICD-10-CM | POA: Diagnosis not present

## 2020-10-01 DIAGNOSIS — R531 Weakness: Secondary | ICD-10-CM | POA: Diagnosis not present

## 2020-10-01 DIAGNOSIS — C562 Malignant neoplasm of left ovary: Secondary | ICD-10-CM | POA: Diagnosis not present

## 2020-10-01 DIAGNOSIS — R1900 Intra-abdominal and pelvic swelling, mass and lump, unspecified site: Secondary | ICD-10-CM

## 2020-10-01 DIAGNOSIS — R5381 Other malaise: Secondary | ICD-10-CM

## 2020-10-01 DIAGNOSIS — R634 Abnormal weight loss: Secondary | ICD-10-CM | POA: Diagnosis not present

## 2020-10-01 DIAGNOSIS — D849 Immunodeficiency, unspecified: Secondary | ICD-10-CM | POA: Diagnosis not present

## 2020-10-01 DIAGNOSIS — Z5111 Encounter for antineoplastic chemotherapy: Secondary | ICD-10-CM | POA: Diagnosis not present

## 2020-10-01 DIAGNOSIS — R112 Nausea with vomiting, unspecified: Secondary | ICD-10-CM | POA: Diagnosis not present

## 2020-10-01 LAB — CMP (CANCER CENTER ONLY)
ALT: 54 U/L — ABNORMAL HIGH (ref 0–44)
AST: 38 U/L (ref 15–41)
Albumin: 3.4 g/dL — ABNORMAL LOW (ref 3.5–5.0)
Alkaline Phosphatase: 132 U/L — ABNORMAL HIGH (ref 38–126)
Anion gap: 10 (ref 5–15)
BUN: 8 mg/dL (ref 6–20)
CO2: 26 mmol/L (ref 22–32)
Calcium: 9.7 mg/dL (ref 8.9–10.3)
Chloride: 104 mmol/L (ref 98–111)
Creatinine: 0.68 mg/dL (ref 0.44–1.00)
GFR, Estimated: >60 mL/min (ref >60)
Glucose, Bld: 122 mg/dL — ABNORMAL HIGH (ref 70–99)
Potassium: 3.9 mmol/L (ref 3.5–5.1)
Sodium: 140 mmol/L (ref 135–145)
Total Bilirubin: <0.2 mg/dL — ABNORMAL LOW (ref 0.3–1.2)
Total Protein: 7.2 g/dL (ref 6.5–8.1)

## 2020-10-01 LAB — TOTAL PROTEIN, URINE DIPSTICK

## 2020-10-01 LAB — CBC WITH DIFFERENTIAL (CANCER CENTER ONLY)
Abs Immature Granulocytes: 0.01 10*3/uL (ref 0.00–0.07)
Basophils Absolute: 0 10*3/uL (ref 0.0–0.1)
Basophils Relative: 0 %
Eosinophils Absolute: 0.1 10*3/uL (ref 0.0–0.5)
Eosinophils Relative: 1 %
HCT: 29.2 % — ABNORMAL LOW (ref 36.0–46.0)
Hemoglobin: 9.7 g/dL — ABNORMAL LOW (ref 12.0–15.0)
Immature Granulocytes: 0 %
Lymphocytes Relative: 21 %
Lymphs Abs: 1.2 10*3/uL (ref 0.7–4.0)
MCH: 28.7 pg (ref 26.0–34.0)
MCHC: 33.2 g/dL (ref 30.0–36.0)
MCV: 86.4 fL (ref 80.0–100.0)
Monocytes Absolute: 0.3 10*3/uL (ref 0.1–1.0)
Monocytes Relative: 5 %
Neutro Abs: 4.1 10*3/uL (ref 1.7–7.7)
Neutrophils Relative %: 73 %
Platelet Count: 176 10*3/uL (ref 150–400)
RBC: 3.38 MIL/uL — ABNORMAL LOW (ref 3.87–5.11)
RDW: 11.4 % — ABNORMAL LOW (ref 11.5–15.5)
WBC Count: 5.6 10*3/uL (ref 4.0–10.5)
nRBC: 0 % (ref 0.0–0.2)

## 2020-10-01 MED ORDER — SODIUM CHLORIDE 0.9 % IV SOLN
800.0000 mg/m2 | Freq: Once | INTRAVENOUS | Status: AC
  Administered 2020-10-01: 1672 mg via INTRAVENOUS
  Filled 2020-10-01: qty 43.97

## 2020-10-01 MED ORDER — HEPARIN SOD (PORK) LOCK FLUSH 100 UNIT/ML IV SOLN
500.0000 [IU] | Freq: Once | INTRAVENOUS | Status: AC | PRN
  Administered 2020-10-01: 500 [IU]
  Filled 2020-10-01: qty 5

## 2020-10-01 MED ORDER — SODIUM CHLORIDE 0.9% FLUSH
10.0000 mL | Freq: Once | INTRAVENOUS | Status: AC
  Administered 2020-10-01: 10 mL
  Filled 2020-10-01: qty 10

## 2020-10-01 MED ORDER — MORPHINE SULFATE 30 MG PO TABS: 30.0000 mg | ORAL_TABLET | ORAL | 0 refills | Status: DC | PRN

## 2020-10-01 MED ORDER — SODIUM CHLORIDE 0.9% FLUSH
10.0000 mL | INTRAVENOUS | Status: DC | PRN
  Administered 2020-10-01: 10 mL
  Filled 2020-10-01: qty 10

## 2020-10-01 MED ORDER — PROCHLORPERAZINE MALEATE 10 MG PO TABS
ORAL_TABLET | ORAL | Status: AC
  Filled 2020-10-01: qty 1

## 2020-10-01 MED ORDER — OXYCODONE HCL 5 MG PO TABS
ORAL_TABLET | ORAL | Status: AC
  Filled 2020-10-01: qty 3

## 2020-10-01 MED ORDER — PROCHLORPERAZINE MALEATE 10 MG PO TABS
10.0000 mg | ORAL_TABLET | Freq: Once | ORAL | Status: AC
  Administered 2020-10-01: 10 mg via ORAL

## 2020-10-01 MED ORDER — SODIUM CHLORIDE 0.9 % IV SOLN
Freq: Once | INTRAVENOUS | Status: AC
  Filled 2020-10-01: qty 250

## 2020-10-01 MED ORDER — OXYCODONE HCL 5 MG PO TABS
15.0000 mg | ORAL_TABLET | Freq: Once | ORAL | Status: AC
  Administered 2020-10-01: 15 mg via ORAL

## 2020-10-01 MED FILL — MORPHINE SULFATE IR 30 MG T: 30 | 10 days supply | Qty: 60 | Fill #0

## 2020-10-01 NOTE — Patient Instructions (Signed)
Implanted Port Insertion, Care After This sheet gives you information about how to care for yourself after your procedure. Your health care provider may also give you more specific instructions. If you have problems or questions, contact your health care provider. What can I expect after the procedure? After the procedure, it is common to have:  Discomfort at the port insertion site.  Bruising on the skin over the port. This should improve over 3-4 days. Follow these instructions at home: Port care  After your port is placed, you will get a manufacturer's information card. The card has information about your port. Keep this card with you at all times.  Take care of the port as told by your health care provider. Ask your health care provider if you or a family member can get training for taking care of the port at home. A home health care nurse may also take care of the port.  Make sure to remember what type of port you have. Incision care  Follow instructions from your health care provider about how to take care of your port insertion site. Make sure you: ? Wash your hands with soap and water before and after you change your bandage (dressing). If soap and water are not available, use hand sanitizer. ? Change your dressing as told by your health care provider. ? Leave stitches (sutures), skin glue, or adhesive strips in place. These skin closures may need to stay in place for 2 weeks or longer. If adhesive strip edges start to loosen and curl up, you may trim the loose edges. Do not remove adhesive strips completely unless your health care provider tells you to do that.  Check your port insertion site every day for signs of infection. Check for: ? Redness, swelling, or pain. ? Fluid or blood. ? Warmth. ? Pus or a bad smell.      Activity  Return to your normal activities as told by your health care provider. Ask your health care provider what activities are safe for you.  Do not  lift anything that is heavier than 10 lb (4.5 kg), or the limit that you are told, until your health care provider says that it is safe. General instructions  Take over-the-counter and prescription medicines only as told by your health care provider.  Do not take baths, swim, or use a hot tub until your health care provider approves. Ask your health care provider if you may take showers. You may only be allowed to take sponge baths.  Do not drive for 24 hours if you were given a sedative during your procedure.  Wear a medical alert bracelet in case of an emergency. This will tell any health care providers that you have a port.  Keep all follow-up visits as told by your health care provider. This is important. Contact a health care provider if:  You cannot flush your port with saline as directed, or you cannot draw blood from the port.  You have a fever or chills.  You have redness, swelling, or pain around your port insertion site.  You have fluid or blood coming from your port insertion site.  Your port insertion site feels warm to the touch.  You have pus or a bad smell coming from the port insertion site. Get help right away if:  You have chest pain or shortness of breath.  You have bleeding from your port that you cannot control. Summary  Take care of the port as told by your   health care provider. Keep the manufacturer's information card with you at all times.  Change your dressing as told by your health care provider.  Contact a health care provider if you have a fever or chills or if you have redness, swelling, or pain around your port insertion site.  Keep all follow-up visits as told by your health care provider. This information is not intended to replace advice given to you by your health care provider. Make sure you discuss any questions you have with your health care provider. Document Revised: 02/16/2018 Document Reviewed: 02/16/2018 Elsevier Patient Education   2021 [employer]  

## 2020-10-01 NOTE — Progress Notes (Signed)
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   All questions were answered. The patient knows to call the clinic with any problems, questions or concerns. The total time spent in the appointment was 30 minutes encounter with patients including review of chart and various tests results, discussions about plan of care and coordination of care plan   Heath Lark, MD 10/01/2020 2:54 PM  INTERVAL HISTORY: Please see below for problem oriented charting. 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 There is a 16 cm complex cystic mass in the pelvis containing thickened septations located near midline, abutting the superior aspect of the uterus. I suspect this mass is adnexal/ovarian in origin rather than uterine in origin. Specifically, I suspect the mass likely arises from the left ovary/adnexa and is most consistent with a neoplasm. Malignancy is certainly not excluded on this study. Recommend a pelvic ultrasound for further evaluation. 2. The rounded more solid-appearing structure in the right side of the pelvis is probably the right ovary. Recommend attention on ultrasound. 3. The small amount of fluid in the pelvis is likely reactive to the complex cystic mass likely rising from the left ovary/adnexa. 4. No cause for blood in vomit or black stools identified. No convincing evidence of a perforated or inflamed gastric or duodenal ulcer. 5. Hepatic steatosis.   07/02/2019 Imaging   MRI pelvis 1. 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 There are 2 enhancing nodules within the central upper abdomen raising the possibility of peritoneal carcinomatosis. Additionally, there is a small amount fluid within the pelvis. Malignant ascites not excluded. 2. Fibroid uterus. 3. Hepatic steatosis.   07/02/2019 Imaging   US pelvis 1. There is a  large mass in the pelvis also seen on CT imaging. A separate left ovary is not visualized. 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 The mass is suspicious for malignancy. Recommend gynecologic consultation. 2. The right ovary demonstrates an irregular ill-defined  hypoechoic hypervascular region centrally. The findings are nonspecific in the right ovary. However, given the apparent left ovarian neoplasm suspicious for malignancy, recommend an MRI to further assess the right ovary. 3. Uterine fibroid measuring 3.6 cm.     07/21/2019 Pathology Results   FINAL MICROSCOPIC DIAGNOSIS: A. 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 - See oncology table. B. OMENTUM, RESECTION: - Omental lymph node with metastatic carcinoma (1/1). - Omental adipose tissue with foci of inflammation and reactive mesothelial changes. C. FALLOPIAN TUBE, LEFT, RESECTION: - Fallopian tube with focal, mild inflammation and fibrosis. - No tumor identified. D. UTERUS, CERVIX, RIGHT TUBE AND OVARY: - Cervix Nabothian cyst and squamous metaplasia. - Endometrium Proliferative. No hyperplasia or carcinoma. - Myometrium Leiomyomata. No malignancy. -Right ovary Poorly differentiated carcinoma, 4.8 cm. See oncology table and comment. -Right Fallopian tube Benign paratubal cyst. No endometriosis or malignancy. ONCOLOGY TABLE: OVARY or FALLOPIAN TUBE or PRIMARY PERITONEUM: Procedure: Bilateral f-oophorectomy, hysterectomy and omentectomy. Specimen Integrity: Intact Tumor Site: Left ovary and right ovary. See comment. Ovarian Surface Involvement: Not identified. Fallopian Tube Surface Involvement: Not identified. Tumor Size: Left ovary: 18.6 x 16.0 x 7.2 cm. Right ovary: 4.8 x 3.2 x 3.2 cm. Histologic Type: Clear cell carcinoma. See comment. Histologic Grade: High-grade. Other Tissue/ Organ Involvement: Omental lymph node with metastatic carcinoma. Peritoneal/Ascitic Fluid: Pending. Treatment Effect: No known presurgical therapy. Pathologic Stage Classification (pTNM, AJCC 8th Edition): pT3a, pN1a. Representative Tumor Block: A2, A3, A4, A5 A6, A7 and A8. Comment(s): The left ovarian tumor is 18.6 cm in greatest dimension and has features  of clear cell carcinoma. The right ovarian tumor is 4.8 cm in greatest dimension and is poorly differentiated with focal features consistent with clear cell carcinoma. The pattern of involvement suggests that the right ovarian tumor is likely metastatic from the larger left ovarian clear cell carcinoma. Sections of the omentum show a lymph node with metastatic carcinoma. Sections of the remainder of the omentum do not show involvement by carcinoma.   07/21/2019 Surgery   Procedure(s) Performed: Exploratory laparotomy with radical tumor debulking including total hysterectomy, bilateral salpingo-oophorectomy, infra-gastric omentectomy, and washings.   Surgeon: Jeral Pinch, MD    Operative Findings: On EUA, mobile uterus and ovarian mass, situated out of the pelvis, spanning 6-8 cm above the umbilicus.  On intra-abdominal entry, inflamed omentum adherent to much of the 16 cm left ovarian mass, adhesions easily lysed bluntly.  No nodularity of the ovarian mass however on delivery of the mass through the midline incision, there was Intra-Op rupture of one of the smaller cystic components with drainage of clear brown-tinged fluid.  Grossly normal-appearing left fallopian tube as well as right tube.  Right ovary mildly enlarged measuring approximately 4 cm and firm/nodular, suspicious for tumor involvement.  Uterus 8-10 cm with 4 cm anterior mid body fibroid.  Some very small volume miliary disease along right pelvic sidewall, removed with uterine specimen.  Evidence of small diverticular disease.  Small bowel normal appearing although multiple loops of bowel adherent to each other, especially by mesentery, and an inflammatory manner.  An approximately 2 x 2 centimeter nodule suspicious for tumor implant was found in the omentum just inferior to the  greater curvature of the stomach.  Additional small (less than 5 mm) implants noted on the stomach and posterior aspect of the left lobe of the liver.  Otherwise  the liver surface and diaphragm were smooth.   08/03/2019 Cancer Staging   Staging form: Ovary, Fallopian Tube, and Primary Peritoneal Carcinoma, AJCC 8th Edition - Pathologic: Stage IIIA2 (pT3a, pN1, cM0) - Signed by Heath Lark, MD on 08/03/2019   08/12/2019 Procedure   Placement of single lumen port a cath via right internal jugular vein. The catheter tip lies at the cavo-atrial junction. A power injectable port a cath was placed and is ready for immediate use.   08/22/2019 -  Chemotherapy   The patient had carboplatin and taxol for chemotherapy treatment.     09/23/2019 Genetic Testing   Negative genetic testing:  No pathogenic variants detected on the [facility] TumorNext-HRD+CancerNext paired germline and somatic genetic test. The report date is 09/23/2019.  The TumorNext-HRD+CancerNext test offered by [facility] includes paired germline and tumor analyses of 11 genes associated with homologous recombination repair (ATM, BARD1, BRIP1, CHEK2, MRE11A, NBN, PALB2, RAD51C, RAD51D, BRCA1, BRCA2) plus germline analyses of 26 additional genes associated with hereditary cancer (APC, AXIN2, BMPR1A, CDH1, CDK4, CDKN2A, DICER1, HOXB13, EPCAM, GREM1, MLH1, MSH2, MSH3, MSH6, MUTYH, NF1, NTHL1, PMS2, POLD1, POLE, PTEN, RECQL, SMAD4, SMARCA4, STK11, and TP53).   11/14/2019 Tumor Marker   Patient's tumor was tested for the following markers: CA-125 Results of the tumor marker test revealed 10.2.   12/05/2019 Tumor Marker   Patient's tumor was tested for the following markers: CA-125 Results of the tumor marker test revealed 9.9   01/05/2020 Imaging   No specific findings of active malignancy. Interval resection of the pelvic mass. Subtle nodularity along the right side of the vaginal cuff merits surveillance.     01/05/2020 Tumor Marker   Patient's tumor was tested for the following markers: CA-125 Results of the tumor marker test revealed 9.7   02/17/2020 Tumor Marker   Patient's tumor was tested for the  following markers: CA-125 Results of the tumor marker test revealed 9.6   04/05/2020 Imaging   1. There is some minimal stranding along the mesentery and omentum without overt omental caking or well-defined nodularity. Faint bandlike density along the anterior peritoneal reflection in the pelvis, slightly more notable than on prior. Overall the appearance is not considered specific for recurrence but given the slight increase in prominence from 01/05/2020, would encourage careful surveillance by tumor markers and imaging. 2. Mild lower lumbar degenerative facet arthropathy.   04/05/2020 Tumor Marker   Patient's tumor was tested for the following markers: CA-125 Results of the tumor marker test revealed 11   05/29/2020 Tumor Marker   Patient's tumor was tested for the following markers: CA-125. Results of the tumor marker test revealed 17.   07/04/2020 Tumor Marker   Patient's tumor was tested for the following markers: CA-125. Results of the tumor marker test revealed 20.1   07/16/2020 Imaging   Stable mild stranding throughout mesenteric and omental fat, without evidence of discrete masses or ascites. No evidence of new or progressive disease.   08/29/2020 Tumor Marker   Patient's tumor was tested for the following markers: CA-125 Results of the tumor marker test revealed 32.7.   09/07/2020 Imaging   1. Interval progression of peritoneal disease within the abdomen and pelvis. Tumor predominantly involves the serosal surface of the large and small bowel loops. No signs of bowel obstruction identified at this time. 2. No evidence  for metastatic disease to the chest.   09/19/2020 Imaging   1. No acute findings identified within the abdomen or pelvis. No evidence for bowel obstruction. 2. Similar appearance of soft tissue thickening along the peritoneal reflections within the abdomen and pelvis compatible with known peritoneal disease. Recently characterized increased soft tissue along the  surface of the jejunal bowel loops and tethering of pelvic small bowel loops is difficult to quantify (and compared with previous exam) due to lack of IV contrast material.     09/20/2020 -  Chemotherapy    Patient is on Treatment Plan: OVARIAN RECURRENT 3RD LINE CARBOPLATIN D1 / GEMCITABINE D1,8 (4/800) Q21D        REVIEW OF SYSTEMS:   Constitutional: Denies fevers, chills or abnormal weight loss Eyes: Denies blurriness of vision Ears, nose, mouth, throat, and face: Denies mucositis or sore throat Respiratory: Denies cough, dyspnea or wheezes Cardiovascular: Denies palpitation, chest discomfort or lower extremity swelling Skin: Denies abnormal skin rashes LyBehavioral/Psych: Mood is stable, no new changes  All other systems were reviewed with the patient and are negative.  I have reviewed the past medical history, past surgical history, social history and family history with the patient and they are unchanged from previous note.  ALLERGIES:  has No Known Allergies.  MEDICATIONS:  Current Outpatient Medications  Medication Sig Dispense Refill  . acetaminophen (TYLENOL) 500 MG tablet Take 1,000 mg by mouth every 6 (six) hours as needed for moderate pain.     . famotidine (PEPCID) 20 MG tablet Take 1 tablet (20 mg total) by mouth 2 (two) times daily. 60 tablet 1  . lidocaine-prilocaine (EMLA) cream Apply to affected area once (Patient taking differently: Apply 1 application topically as directed. Apply to affected area once. Access Port) 30 g 3  . metFORMIN (GLUCOPHAGE) 500 MG tablet TAKE 1 TABLET BY MOUTH TWICE DAILY WITH MEALS 60 tablet 1  . morphine (MSIR) 30 MG tablet Take 1 tablet (30 mg total) by mouth every 4 (four) hours as needed for severe pain. 60 tablet 0  . Multiple Vitamin (MULTIVITAMIN WITH MINERALS) TABS tablet Take 1 tablet by mouth daily.    . ondansetron (ZOFRAN) 8 MG tablet Take 8 mg by mouth every 8 (eight) hours as needed for nausea or vomiting.    . pantoprazole  (PROTONIX) 40 MG tablet Take 1 tablet (40 mg total) by mouth 2 (two) times daily. 60 tablet 11  . promethazine (PHENERGAN) 25 MG tablet Take 25 mg by mouth every 6 (six) hours as needed for nausea or vomiting.     No current facility-administered medications for this visit.   Facility-Administered Medications Ordered in Other Visits  Medication Dose Route Frequency Provider Last Rate Last Admin  . sodium chloride flush (NS) 0.9 % injection 10 mL  10 mL Intracatheter PRN Alvy Bimler, Ni, MD   10 mL at 10/01/20 1309    PHYSICAL EXAMINATION: ECOG PERFORMANCE STATUS: 2 - Symptomatic, <50% confined to bed  Vitals:   10/01/20 1042  BP: (!) 144/98  Pulse: 84  Resp: 18  Temp: (!) 97.2 F (36.2 C)  SpO2: 100%   Filed Weights   10/01/20 1042  Weight: 208 lb 6.4 oz (94.5 kg)    GENERAL:alert, no distress and comfortable SKIN: skin color, texture, turgor are normal, no rashes or significant lesions EYES: normal, Conjunctiva are pink and non-injected, sclera clear OROPHARYNX:no exudate, no erythema and lips, buccal mucosa, and tongue normal  NECK: supple, thyroid normal size, non-tender, without nodularity LYMPH:  no palpable lymphadenopathy in the cervical, axillary or inguinal LUNGS: clear to auscultation and percussion with normal breathing effort HEART: regular rate & rhythm and no murmurs and no lower extremity edema ABDOMEN:abdomen soft, non-tender and normal bowel sounds Musculoskeletal:no cyanosis of digits and no clubbing  NEURO: alert & oriented x 3 with fluent speech, no focal motor/sensory deficits  LABORATORY DATA:  I have reviewed the data as listed    Component Value Date/Time   NA 140 10/01/2020 1025   K 3.9 10/01/2020 1025   CL 104 10/01/2020 1025   CO2 26 10/01/2020 1025   GLUCOSE 122 (H) 10/01/2020 1025   BUN 8 10/01/2020 1025   CREATININE 0.68 10/01/2020 1025   CALCIUM 9.7 10/01/2020 1025   PROT 7.2 10/01/2020 1025   ALBUMIN 3.4 (L) 10/01/2020 1025   AST 38  10/01/2020 1025   ALT 54 (H) 10/01/2020 1025   ALKPHOS 132 (H) 10/01/2020 1025   BILITOT <0.2 (L) 10/01/2020 1025   GFRNONAA >60 10/01/2020 1025   GFRAA >60 04/05/2020 1008    No results found for: SPEP, UPEP  Lab Results  Component Value Date   WBC 5.6 10/01/2020   NEUTROABS 4.1 10/01/2020   HGB 9.7 (L) 10/01/2020   HCT 29.2 (L) 10/01/2020   MCV 86.4 10/01/2020   PLT 176 10/01/2020      Chemistry      Component Value Date/Time   NA 140 10/01/2020 1025   K 3.9 10/01/2020 1025   CL 104 10/01/2020 1025   CO2 26 10/01/2020 1025   BUN 8 10/01/2020 1025   CREATININE 0.68 10/01/2020 1025      Component Value Date/Time   CALCIUM 9.7 10/01/2020 1025   ALKPHOS 132 (H) 10/01/2020 1025   AST 38 10/01/2020 1025   ALT 54 (H) 10/01/2020 1025   BILITOT <0.2 (L) 10/01/2020 1025       RADIOGRAPHIC STUDIES: I have personally reviewed the radiological images as listed and agreed with the findings in the report. CT ABDOMEN PELVIS W CONTRAST  Result Date: 09/19/2020 CLINICAL DATA:  Abdominal pain for 3 weeks. Ovarian cancer. Rule out bowel obstruction. EXAM: CT ABDOMEN AND PELVIS WITH CONTRAST TECHNIQUE: Multidetector CT imaging of the abdomen and pelvis was performed using the standard protocol following bolus administration of intravenous contrast. CONTRAST:  169m OMNIPAQUE IOHEXOL 300 MG/ML  SOLN COMPARISON:  09/07/2020 FINDINGS: Lower chest: No acute abnormality. Hepatobiliary: No focal liver abnormality is seen. Status post cholecystectomy. No biliary dilatation. Pancreas: Unremarkable. No pancreatic ductal dilatation or surrounding inflammatory changes. Spleen: Normal in size without focal abnormality. Adrenals/Urinary Tract: Normal adrenal glands. No kidney mass or hydronephrosis identified. Urinary bladder is unremarkable. Stomach/Bowel: Stomach is within normal limits. Appendix appears normal. Similar appearance of soft tissue thickening along the serosal surface of small and large  bowel loops consistent with peritoneal disease. Subjectively this is similar to the previous exam. Vascular/Lymphatic: Normal appearance of the abdominal aorta. No aneurysm. No abdominopelvic adenopathy identified. Reproductive: Status post hysterectomy. No adnexal masses. Other: There is no ascites. Soft tissue thickening is again noted along the peritoneal reflections within the abdomen and pelvis compatible with known peritoneal disease. The appearance is similar to the previous exam. Interloop soft tissue thickening with tethering of pelvic small bowel loops is difficult to quantify in compared with previous exam reflecting lack of enteric contrast material. Previously described soft tissue thickening along the serosal surface of the proximal transverse colon is similar to previous exam, image 45/5. Recently characterized increased soft tissue  thickening along the surface of the jejunal bowel loops is difficult to visualize, also reflecting lack of IV contrast material. Musculoskeletal: No acute or significant osseous findings. IMPRESSION: 1. No acute findings identified within the abdomen or pelvis. No evidence for bowel obstruction. 2. Similar appearance of soft tissue thickening along the peritoneal reflections within the abdomen and pelvis compatible with known peritoneal disease. Recently characterized increased soft tissue along the surface of the jejunal bowel loops and tethering of pelvic small bowel loops is difficult to quantify (and compared with previous exam) due to lack of IV contrast material. Electronically Signed   By: Kerby Moors M.D.   On: 09/19/2020 14:15   CT CHEST ABDOMEN PELVIS W CONTRAST  Result Date: 09/07/2020 CLINICAL DATA:  Restaging ovarian cancer. EXAM: CT CHEST, ABDOMEN, AND PELVIS WITH CONTRAST TECHNIQUE: Multidetector CT imaging of the chest, abdomen and pelvis was performed following the standard protocol during bolus administration of intravenous contrast. CONTRAST:   122m OMNIPAQUE IOHEXOL 300 MG/ML  SOLN COMPARISON:  CT AP 07/16/2020.  CT CAP 01/05/2020 FINDINGS: CT CHEST FINDINGS Cardiovascular: Heart size appears within normal limits. No pericardial effusion. Mediastinum/Nodes: Normal appearance of the thyroid gland. The trachea appears patent and is midline. Normal appearance of the esophagus. No enlarged lymph nodes. Lungs/Pleura: Bilateral lower lobe calcified granulomas. No suspicious lung nodules. Musculoskeletal: No chest wall mass or suspicious bone lesions identified. CT ABDOMEN PELVIS FINDINGS Hepatobiliary: No focal liver abnormality is seen. Status post cholecystectomy. No biliary dilatation. Pancreas: Unremarkable. No pancreatic ductal dilatation or surrounding inflammatory changes. Spleen: Normal in size without focal abnormality. Adrenals/Urinary Tract: Normal adrenal glands. No kidney mass or hydronephrosis identified. Urinary bladder is unremarkable. Stomach/Bowel: The stomach appears normal. The appendix is visualized and is within normal limits. No pathologic dilatation of the large or small bowel loops. Soft tissue thickening along the serosal surface of the small and large bowel loops is again identified compatible with peritoneal disease. Subjectively this appears increased from the previous exam from 07/16/2020 Vascular/Lymphatic: No aneurysm.  No abdominopelvic adenopathy. Reproductive: Status post hysterectomy. No adnexal masses. Other: Moderate diffuse soft tissue thickening is identified along the peritoneal surface of the abdomen and pelvis. Multifocal areas of serosal involvement of the large and small bowel noted within the abdomen and pelvis. -For example, within the pelvis there is focal area interloop soft tissue with tethering of pelvic small bowel loops, image 72/5. Here central area of increased soft tissue measures 1.9 x 1.5 cm, image 72/5. Previously this area measured 1.8 x 1.4 cm. -Within the right abdomen there is progressive soft  tissue along the serosal surface of the proximal transverse colon, image 44/5. -Within the central abdomen there is increased soft tissue thickening along the surface of the jejunal bowel loops, image 38/5. Musculoskeletal: No acute or significant osseous findings. IMPRESSION: 1. Interval progression of peritoneal disease within the abdomen and pelvis. Tumor predominantly involves the serosal surface of the large and small bowel loops. No signs of bowel obstruction identified at this time. 2. No evidence for metastatic disease to the chest. Electronically Signed   By: TKerby MoorsM.D.   On: 09/07/2020 10:21

## 2020-10-01 NOTE — Assessment & Plan Note (Signed)
She is at high risk for recurrent hospitalization due to changes seen on her recent CT imaging She is high risk for bowel obstruction She is immunocompromised from her treatment I do not recommend her to return back to work and I spent some time completing her disability paperwork today

## 2020-10-01 NOTE — Progress Notes (Signed)
She vomited approximately 200 ml. She felt better after vomiting.

## 2020-10-01 NOTE — Assessment & Plan Note (Signed)
She continues to have severe abdominal pain I recommend increasing the dose of ir morphine

## 2020-10-01 NOTE — Patient Instructions (Signed)
[facility] Discharge Instructions for Patients Receiving Chemotherapy  Today you received the following chemotherapy agents :  Gemzar  To help prevent nausea and vomiting after your treatment, we encourage you to take your nausea medication.   If you develop nausea and vomiting that is not controlled by your nausea medication, call the clinic.   BELOW ARE SYMPTOMS THAT SHOULD BE REPORTED IMMEDIATELY:  *FEVER GREATER THAN 100.5 F  *CHILLS WITH OR WITHOUT FEVER  NAUSEA AND VOMITING THAT IS NOT CONTROLLED WITH YOUR NAUSEA MEDICATION  *UNUSUAL SHORTNESS OF BREATH  *UNUSUAL BRUISING OR BLEEDING  TENDERNESS IN MOUTH AND THROAT WITH OR WITHOUT PRESENCE OF ULCERS  *URINARY PROBLEMS  *BOWEL PROBLEMS  UNUSUAL RASH Items with * indicate a potential emergency and should be followed up as soon as possible.  Feel free to call the clinic should you have any questions or concerns. The clinic phone number is [phone].  Please show the CHEMO ALERT CARD at check-in to the Emergency Department and triage nurse.   

## 2020-10-01 NOTE — Assessment & Plan Note (Signed)
She has persistent abdominal pain related to her disease We will proceed with chemotherapy as scheduled I recommend minimum 3 cycles of treatment before repeating CT imaging I will add bevacizumab in the future if next CT scan showed resolution of peritoneal disease

## 2020-10-01 NOTE — Assessment & Plan Note (Signed)
This is multifactorial, likely anemia of chronic disease She is not symptomatic Observe for now 

## 2020-10-02 LAB — CA 125: Cancer Antigen (CA) 125: 37.2 U/mL (ref 0.0–38.1)

## 2020-10-03 ENCOUNTER — Encounter: Payer: Self-pay | Admitting: Hematology and Oncology

## 2020-10-04 ENCOUNTER — Telehealth: Payer: Self-pay

## 2020-10-04 ENCOUNTER — Inpatient Hospital Stay: Payer: 59 | Attending: Gynecologic Oncology | Admitting: Medical

## 2020-10-04 ENCOUNTER — Other Ambulatory Visit: Payer: Self-pay

## 2020-10-04 VITALS — BP 92/66 | HR 109 | Temp 99.1°F | Resp 18 | Wt 209.1 lb

## 2020-10-04 DIAGNOSIS — C762 Malignant neoplasm of abdomen: Secondary | ICD-10-CM | POA: Diagnosis not present

## 2020-10-04 DIAGNOSIS — T451X5A Adverse effect of antineoplastic and immunosuppressive drugs, initial encounter: Secondary | ICD-10-CM | POA: Insufficient documentation

## 2020-10-04 DIAGNOSIS — Z8041 Family history of malignant neoplasm of ovary: Secondary | ICD-10-CM | POA: Insufficient documentation

## 2020-10-04 DIAGNOSIS — C562 Malignant neoplasm of left ovary: Secondary | ICD-10-CM

## 2020-10-04 DIAGNOSIS — Z5111 Encounter for antineoplastic chemotherapy: Secondary | ICD-10-CM | POA: Insufficient documentation

## 2020-10-04 DIAGNOSIS — R1012 Left upper quadrant pain: Secondary | ICD-10-CM

## 2020-10-04 DIAGNOSIS — Z800 Family history of malignant neoplasm of digestive organs: Secondary | ICD-10-CM | POA: Insufficient documentation

## 2020-10-04 DIAGNOSIS — G893 Neoplasm related pain (acute) (chronic): Secondary | ICD-10-CM | POA: Insufficient documentation

## 2020-10-04 DIAGNOSIS — R5383 Other fatigue: Secondary | ICD-10-CM | POA: Insufficient documentation

## 2020-10-04 DIAGNOSIS — Z8042 Family history of malignant neoplasm of prostate: Secondary | ICD-10-CM | POA: Insufficient documentation

## 2020-10-04 DIAGNOSIS — Z803 Family history of malignant neoplasm of breast: Secondary | ICD-10-CM | POA: Insufficient documentation

## 2020-10-04 DIAGNOSIS — Z8379 Family history of other diseases of the digestive system: Secondary | ICD-10-CM | POA: Insufficient documentation

## 2020-10-04 DIAGNOSIS — E669 Obesity, unspecified: Secondary | ICD-10-CM | POA: Insufficient documentation

## 2020-10-04 DIAGNOSIS — Z6839 Body mass index (BMI) 39.0-39.9, adult: Secondary | ICD-10-CM | POA: Insufficient documentation

## 2020-10-04 DIAGNOSIS — R0602 Shortness of breath: Secondary | ICD-10-CM | POA: Insufficient documentation

## 2020-10-04 DIAGNOSIS — C800 Disseminated malignant neoplasm, unspecified: Secondary | ICD-10-CM | POA: Insufficient documentation

## 2020-10-04 DIAGNOSIS — D6481 Anemia due to antineoplastic chemotherapy: Secondary | ICD-10-CM | POA: Insufficient documentation

## 2020-10-04 DIAGNOSIS — Z79899 Other long term (current) drug therapy: Secondary | ICD-10-CM | POA: Insufficient documentation

## 2020-10-04 NOTE — Telephone Encounter (Signed)
Called and added to [employer], PA schedule at 11 am today. Okayed to add per Sandi Mealy, PA. She is aware of appt time.

## 2020-10-04 NOTE — Telephone Encounter (Signed)
Called regarding mychart message from last night . Complaining of headache that started Monday after treatment. Still complaining of headache ache today. She will take tylenol now for headache.  Temperature yesterday of 101.3 yesterday, she took tylenol yesterday. Temperature 98.3 now. Denies nausea and vomiting. She c/o's of lack of appetite. Instructed to drink lots of fluids, start drinking gatorade now. Instructed to start eating small meals and eat breakfast now. She checked her Bp and it is 88/58 now. She verbalized understanding and the office will call back.

## 2020-10-04 NOTE — Telephone Encounter (Signed)
I have no room to add her on today Can you see if [facility] can see her?

## 2020-10-08 NOTE — Progress Notes (Signed)
[facility] Progress Note   Sarah Newman [id number] 1976-04-04 45 y.o.  Sarah Newman is managed by Dr. 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 is managed by Dr. Heath Lark and is status post cycle 1, day 8 of carboplatin and Gemzar which was dosed on 10/01/2020.  She is scheduled to return for treatment only prior to being seen by Dr. Heath Lark in follow-up on 10/22/2020.  Left upper quadrant abdominal pain: Ms. Son is currently taking morphine IR 30 mg twice daily. She was told to increase her morphine IR to 3 times daily dosing.  She was also given an instructional sheet regarding constipation and was told to begin senna S1-2 tablets twice daily along with MiraLAX 1-2 times daily as needed.  Please see After Visit Summary for patient specific instructions.  Future Appointments  Date Time Provider [place]  10/15/2020  9:30 AM [facility] None  10/15/2020  9:45 AM [facility] [place] FLUSH [facility] None  10/15/2020 10:20 AM Heath Lark, MD [facility] None  10/15/2020 11:30 AM [facility] INFUSION [facility] None  10/22/2020  9:30 AM [facility] None  10/22/2020  9:45 AM [facility] INFUSION [facility] None  10/22/2020 10:20 AM Alvy Bimler, Ni, MD [facility] None  10/22/2020 11:15 AM [facility] INFUSION [facility] None    No orders of the defined types were placed in this encounter.      Subjective:   Patient ID:  Sarah Newman is a 45 y.o. (DOB Jan 02, 1976) female.  Chief Complaint: No chief complaint  on file.   HPI Sarah Newman is a 45 y.o. 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 She is followed by Dr. Heath Lark and is status post cycle 1, day 8 of carboplatin and Gemzar which was dosed on 10/01/2020.  She presents to the clinic today with her husband.  She reports that she has had worsening pain in her left upper quadrant which worsens with inhalation.  She rates her pain today as 8/10.  She is also reports some fatigue and mild shortness of breath.  Her most recent CT scans were reviewed.  It was discussed with the patient that she has carcinomatosis and that this could be the source of her pain.  She has a prescription for morphine IR 30 mg with directions that she can take this every 4 hours.  She has only been taking this twice daily.  She is concerned that she could be overly sedated.  She denies fevers, chills, sweats, nausea, vomiting, constipation, or diarrhea.  Medications: I have reviewed the patient's current medications.  Allergies: No Known Allergies  Past Medical History:  Diagnosis Date  . Anemia   . Asthma   . Family history of breast cancer   . Family history of colon cancer   . Family history of ovarian cancer   . Family history of prostate cancer   . Pelvic mass   . Ulcer, stomach peptic     Past Surgical History:  Procedure Laterality Date  . CHOLECYSTECTOMY N/A 02/16/2015   Procedure: LAPAROSCOPIC CHOLECYSTECTOMY WITH INTRAOPERATIVE CHOLANGIOGRAM;  Surgeon: Johnathan Hausen, MD;  Location:  [facility];  Service: General;  Laterality: N/A;  . IR IMAGING GUIDED PORT INSERTION  08/12/2019  . SALPINGOOPHORECTOMY N/A 07/21/2019   Procedure: EXPLORATORY LAPAROTOMY, TOTAL ABDOMINAL HYSTERECTOMY, BILATERAL SALPINGO OOPHORECTOMY, OMENTECTOMY;  Surgeon: Lafonda Mosses, MD;  Location: [facility];  Service: Gynecology;  Laterality: N/A;  . UPPER GASTROINTESTINAL ENDOSCOPY      Family History  Problem Relation Age of  Onset  . Colon cancer Father 90  . Prostate cancer Father 6  . Colon cancer Paternal Aunt        dx. early 28s  . Ovarian cancer Paternal Grandmother 62  . Breast cancer Cousin   . Endometriosis Mother   . Cancer Paternal Uncle        unknown type, dx. early 88s  . Cancer Paternal Uncle 65       unknown type  . Esophageal cancer Neg Hx     Social History   Socioeconomic History  . Marital status: Married    Spouse name: Herbie [place]  . Number of children: 2  . Years of education: Not on file  . Highest education level: Not on file  Occupational History  . Not on file  Tobacco Use  . Smoking status: Never Smoker  . Smokeless tobacco: Never Used  Vaping Use  . Vaping Use: Never used  Substance and Sexual Activity  . Alcohol use: Yes    Comment: Social drinker  . Drug use: No  . Sexual activity: Yes  Other Topics Concern  . 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  Newly married as of October 2020.   2 boys, age 69 and 45   Social Determinants of Health   Emergency planning/management officer Strain: Not on [employer] Insecurity: Not on file  Transportation Needs: Not on file  Physical Activity: Not on file  Stress: Not on file  Social Connections: Not on file  Intimate Partner Violence: Not on file    Past Medical History, Surgical history, Social history, and Family history were reviewed and updated as appropriate.   Please see review of systems for further details on the patient's review from today.   Review of Systems:  Review of Systems  Constitutional: Positive for fatigue. Negative for activity change, appetite change, chills, diaphoresis and fever.  HENT: Negative for trouble swallowing.   Respiratory: Positive for shortness of breath. Negative for cough and chest tightness.   Cardiovascular: Negative for chest pain, palpitations and leg swelling.  Gastrointestinal: Positive for abdominal pain. Negative for abdominal distention,  constipation, diarrhea, nausea and vomiting.    Objective:   Physical Exam:  BP 92/66 (BP Location: Right Arm, Patient Position: Sitting)   Pulse (!) 109   Temp 99.1 F (37.3 C) (Tympanic)   Resp 18   Wt 209 lb 1.6 oz (94.8 kg)   LMP 07/05/2019   SpO2 100%   BMI 35.89 kg/m  ECOG: 0  Physical Exam Constitutional:      General: She is not in acute distress.    Appearance: She is not diaphoretic.  HENT:     Head: Normocephalic and atraumatic.     Mouth/Throat:     Pharynx: No oropharyngeal exudate.  Eyes:     General: No scleral icterus.       Right eye: No discharge.        Left eye: No discharge.     Conjunctiva/sclera: Conjunctivae normal.  Cardiovascular:     Rate  and Rhythm: Normal rate and regular rhythm.     Heart sounds: Normal heart sounds. No murmur heard. No friction rub. No gallop.   Pulmonary:     Effort: Pulmonary effort is normal. No respiratory distress.     Breath sounds: Normal breath sounds. No wheezing or rales.  Abdominal:     General: Bowel sounds are normal. There is no distension.     Palpations: Abdomen is soft. There is no mass.     Tenderness: There is abdominal tenderness. There is no guarding or rebound.  Musculoskeletal:     Cervical back: Normal range of motion and neck supple.  Lymphadenopathy:     Cervical: No cervical adenopathy.  Skin:    General: Skin is warm and dry.     Findings: No erythema or rash.  Neurological:     Mental Status: She is alert.     Coordination: Coordination normal.  Psychiatric:        Mood and Affect: Mood and affect normal.        Behavior: Behavior normal.        Thought Content: Thought content normal.        Judgment: Judgment normal.     Lab Review:     Component Value Date/Time   NA 140 10/01/2020 1025   K 3.9 10/01/2020 1025   CL 104 10/01/2020 1025   CO2 26 10/01/2020 1025   GLUCOSE 122 (H) 10/01/2020 1025   BUN 8 10/01/2020 1025   CREATININE 0.68 10/01/2020 1025   CALCIUM 9.7  10/01/2020 1025   PROT 7.2 10/01/2020 1025   ALBUMIN 3.4 (L) 10/01/2020 1025   AST 38 10/01/2020 1025   ALT 54 (H) 10/01/2020 1025   ALKPHOS 132 (H) 10/01/2020 1025   BILITOT <0.2 (L) 10/01/2020 1025   GFRNONAA >60 10/01/2020 1025   GFRAA >60 04/05/2020 1008       Component Value Date/Time   WBC 5.6 10/01/2020 1025   WBC 5.9 09/21/2020 0430   RBC 3.38 (L) 10/01/2020 1025   HGB 9.7 (L) 10/01/2020 1025   HCT 29.2 (L) 10/01/2020 1025   PLT 176 10/01/2020 1025   MCV 86.4 10/01/2020 1025   MCH 28.7 10/01/2020 1025   MCHC 33.2 10/01/2020 1025   RDW 11.4 (L) 10/01/2020 1025   LYMPHSABS 1.2 10/01/2020 1025   MONOABS 0.3 10/01/2020 1025   EOSABS 0.1 10/01/2020 1025   BASOSABS 0.0 10/01/2020 1025   -------------------------------  Imaging from last 24 hours (if applicable):  Radiology interpretation: CT ABDOMEN PELVIS W CONTRAST  Result Date: 09/19/2020 CLINICAL DATA:  Abdominal pain for 3 weeks. Ovarian cancer. Rule out bowel obstruction. EXAM: CT ABDOMEN AND PELVIS WITH CONTRAST TECHNIQUE: Multidetector CT imaging of the abdomen and pelvis was performed using the standard protocol following bolus administration of intravenous contrast. CONTRAST:  152mL OMNIPAQUE IOHEXOL 300 MG/ML  SOLN COMPARISON:  09/07/2020 FINDINGS: Lower chest: No acute abnormality. Hepatobiliary: No focal liver abnormality is seen. Status post cholecystectomy. No biliary dilatation. Pancreas: Unremarkable. No pancreatic ductal dilatation or surrounding inflammatory changes. Spleen: Normal in size without focal abnormality. Adrenals/Urinary Tract: Normal adrenal glands. No kidney mass or hydronephrosis identified. Urinary bladder is unremarkable. Stomach/Bowel: Stomach is within normal limits. Appendix appears normal. Similar appearance of soft tissue thickening along the serosal surface of small and large bowel loops consistent with peritoneal disease. Subjectively this is similar to the previous exam.  Vascular/Lymphatic: Normal appearance of the abdominal aorta. No aneurysm. No abdominopelvic adenopathy identified. Reproductive: Status post hysterectomy.  No adnexal masses. Other: There is no ascites. Soft tissue thickening is again noted along the peritoneal reflections within the abdomen and pelvis compatible with known peritoneal disease. The appearance is similar to the previous exam. Interloop soft tissue thickening with tethering of pelvic small bowel loops is difficult to quantify in compared with previous exam reflecting lack of enteric contrast material. Previously described soft tissue thickening along the serosal surface of the proximal transverse colon is similar to previous exam, image 45/5. Recently characterized increased soft tissue thickening along the surface of the jejunal bowel loops is difficult to visualize, also reflecting lack of IV contrast material. Musculoskeletal: No acute or significant osseous findings. IMPRESSION: 1. No acute findings identified within the abdomen or pelvis. No evidence for bowel obstruction. 2. Similar appearance of soft tissue thickening along the peritoneal reflections within the abdomen and pelvis compatible with known peritoneal disease. Recently characterized increased soft tissue along the surface of the jejunal bowel loops and tethering of pelvic small bowel loops is difficult to quantify (and compared with previous exam) due to lack of IV contrast material. Electronically Signed   By: Kerby Moors M.D.   On: 09/19/2020 14:15

## 2020-10-15 ENCOUNTER — Inpatient Hospital Stay (HOSPITAL_BASED_OUTPATIENT_CLINIC_OR_DEPARTMENT_OTHER): Payer: 59 | Admitting: Hematology and Oncology

## 2020-10-15 ENCOUNTER — Inpatient Hospital Stay (HOSPITAL_BASED_OUTPATIENT_CLINIC_OR_DEPARTMENT_OTHER): Payer: Self-pay | Admitting: Medical

## 2020-10-15 ENCOUNTER — Inpatient Hospital Stay: Payer: 59

## 2020-10-15 ENCOUNTER — Encounter: Payer: Self-pay | Admitting: Hematology and Oncology

## 2020-10-15 ENCOUNTER — Other Ambulatory Visit: Payer: Self-pay

## 2020-10-15 ENCOUNTER — Telehealth: Payer: Self-pay | Admitting: Hematology and Oncology

## 2020-10-15 VITALS — BP 95/69 | HR 88 | Temp 97.8°F | Resp 16

## 2020-10-15 DIAGNOSIS — C562 Malignant neoplasm of left ovary: Secondary | ICD-10-CM

## 2020-10-15 DIAGNOSIS — G893 Neoplasm related pain (acute) (chronic): Secondary | ICD-10-CM

## 2020-10-15 DIAGNOSIS — T8090XA Unspecified complication following infusion and therapeutic injection, initial encounter: Secondary | ICD-10-CM

## 2020-10-15 DIAGNOSIS — D6481 Anemia due to antineoplastic chemotherapy: Secondary | ICD-10-CM

## 2020-10-15 DIAGNOSIS — T451X5A Adverse effect of antineoplastic and immunosuppressive drugs, initial encounter: Secondary | ICD-10-CM

## 2020-10-15 DIAGNOSIS — T7840XA Allergy, unspecified, initial encounter: Secondary | ICD-10-CM | POA: Diagnosis not present

## 2020-10-15 DIAGNOSIS — R1900 Intra-abdominal and pelvic swelling, mass and lump, unspecified site: Secondary | ICD-10-CM

## 2020-10-15 LAB — CMP (CANCER CENTER ONLY)
ALT: 25 U/L (ref 0–44)
AST: 23 U/L (ref 15–41)
Albumin: 3.4 g/dL — ABNORMAL LOW (ref 3.5–5.0)
Alkaline Phosphatase: 131 U/L — ABNORMAL HIGH (ref 38–126)
Anion gap: 12 (ref 5–15)
BUN: 8 mg/dL (ref 6–20)
CO2: 26 mmol/L (ref 22–32)
Calcium: 9.6 mg/dL (ref 8.9–10.3)
Chloride: 106 mmol/L (ref 98–111)
Creatinine: 0.73 mg/dL (ref 0.44–1.00)
GFR, Estimated: >60 mL/min (ref >60)
Glucose, Bld: 131 mg/dL — ABNORMAL HIGH (ref 70–99)
Potassium: 3.8 mmol/L (ref 3.5–5.1)
Sodium: 144 mmol/L (ref 135–145)
Total Bilirubin: 0.2 mg/dL — ABNORMAL LOW (ref 0.3–1.2)
Total Protein: 7.4 g/dL (ref 6.5–8.1)

## 2020-10-15 LAB — CBC WITH DIFFERENTIAL (CANCER CENTER ONLY)
Abs Immature Granulocytes: 0.04 10*3/uL (ref 0.00–0.07)
Basophils Absolute: 0 10*3/uL (ref 0.0–0.1)
Basophils Relative: 0 %
Eosinophils Absolute: 0.1 10*3/uL (ref 0.0–0.5)
Eosinophils Relative: 1 %
HCT: 30.8 % — ABNORMAL LOW (ref 36.0–46.0)
Hemoglobin: 9.9 g/dL — ABNORMAL LOW (ref 12.0–15.0)
Immature Granulocytes: 1 %
Lymphocytes Relative: 25 %
Lymphs Abs: 1.5 10*3/uL (ref 0.7–4.0)
MCH: 27.7 pg (ref 26.0–34.0)
MCHC: 32.1 g/dL (ref 30.0–36.0)
MCV: 86 fL (ref 80.0–100.0)
Monocytes Absolute: 0.5 10*3/uL (ref 0.1–1.0)
Monocytes Relative: 8 %
Neutro Abs: 3.9 10*3/uL (ref 1.7–7.7)
Neutrophils Relative %: 65 %
Platelet Count: 372 10*3/uL (ref 150–400)
RBC: 3.58 MIL/uL — ABNORMAL LOW (ref 3.87–5.11)
RDW: 12.2 % (ref 11.5–15.5)
WBC Count: 6.1 10*3/uL (ref 4.0–10.5)
nRBC: 0 % (ref 0.0–0.2)

## 2020-10-15 MED ORDER — SODIUM CHLORIDE 0.9% FLUSH
10.0000 mL | Freq: Once | INTRAVENOUS | Status: AC
  Administered 2020-10-15: 10 mL
  Filled 2020-10-15: qty 10

## 2020-10-15 MED ORDER — FAMOTIDINE IN NACL 20-0.9 MG/50ML-% IV SOLN
INTRAVENOUS | Status: AC
  Filled 2020-10-15: qty 50

## 2020-10-15 MED ORDER — METHYLPREDNISOLONE SODIUM SUCC 125 MG IJ SOLR
125.0000 mg | Freq: Once | INTRAMUSCULAR | Status: AC
  Administered 2020-10-15: 125 mg via INTRAVENOUS

## 2020-10-15 MED ORDER — PALONOSETRON HCL INJECTION 0.25 MG/5ML
0.2500 mg | Freq: Once | INTRAVENOUS | Status: AC
  Administered 2020-10-15: 0.25 mg via INTRAVENOUS

## 2020-10-15 MED ORDER — SODIUM CHLORIDE 0.9 % IV SOLN
Freq: Once | INTRAVENOUS | Status: AC
  Filled 2020-10-15: qty 250

## 2020-10-15 MED ORDER — DIPHENHYDRAMINE HCL 50 MG/ML IJ SOLN
25.0000 mg | Freq: Once | INTRAMUSCULAR | Status: AC
  Administered 2020-10-15: 25 mg via INTRAVENOUS

## 2020-10-15 MED ORDER — SODIUM CHLORIDE 0.9 % IV SOLN
10.0000 mg | Freq: Once | INTRAVENOUS | Status: AC
  Administered 2020-10-15: 10 mg via INTRAVENOUS
  Filled 2020-10-15: qty 10

## 2020-10-15 MED ORDER — HEPARIN SOD (PORK) LOCK FLUSH 100 UNIT/ML IV SOLN
500.0000 [IU] | Freq: Once | INTRAVENOUS | Status: AC | PRN
  Administered 2020-10-15: 500 [IU]
  Filled 2020-10-15: qty 5

## 2020-10-15 MED ORDER — FAMOTIDINE IN NACL 20-0.9 MG/50ML-% IV SOLN
20.0000 mg | Freq: Once | INTRAVENOUS | Status: AC
  Administered 2020-10-15: 20 mg via INTRAVENOUS

## 2020-10-15 MED ORDER — DIPHENHYDRAMINE HCL 25 MG PO CAPS
ORAL_CAPSULE | ORAL | Status: AC
  Filled 2020-10-15: qty 1

## 2020-10-15 MED ORDER — CARBOPLATIN CHEMO INJECTION 600 MG/60ML
600.0000 mg | Freq: Once | INTRAVENOUS | Status: AC
  Administered 2020-10-15: 600 mg via INTRAVENOUS
  Filled 2020-10-15: qty 60

## 2020-10-15 MED ORDER — DIPHENHYDRAMINE HCL 25 MG PO CAPS
25.0000 mg | ORAL_CAPSULE | Freq: Once | ORAL | Status: AC
  Administered 2020-10-15: 25 mg via ORAL

## 2020-10-15 MED ORDER — PALONOSETRON HCL INJECTION 0.25 MG/5ML
INTRAVENOUS | Status: AC
  Filled 2020-10-15: qty 5

## 2020-10-15 MED ORDER — SODIUM CHLORIDE 0.9 % IV SOLN
150.0000 mg | Freq: Once | INTRAVENOUS | Status: AC
  Administered 2020-10-15: 150 mg via INTRAVENOUS
  Filled 2020-10-15: qty 150

## 2020-10-15 MED ORDER — SODIUM CHLORIDE 0.9% FLUSH
10.0000 mL | INTRAVENOUS | Status: DC | PRN
  Administered 2020-10-15: 10 mL
  Filled 2020-10-15: qty 10

## 2020-10-15 MED ORDER — SODIUM CHLORIDE 0.9 % IV SOLN
800.0000 mg/m2 | Freq: Once | INTRAVENOUS | Status: AC
  Administered 2020-10-15: 1634 mg via INTRAVENOUS
  Filled 2020-10-15: qty 42.98

## 2020-10-15 NOTE — Assessment & Plan Note (Signed)
Her allergic reaction is managed appropriately while in the infusion area As above, I will plan to omit carboplatin from future treatment

## 2020-10-15 NOTE — Patient Instructions (Signed)
[place] Discharge Instructions for Patients Receiving Chemotherapy  Today you received the following chemotherapy agents Gemzar & Carbo  To help prevent nausea and vomiting after your treatment, we encourage you to take your nausea medication {   If you develop nausea and vomiting that is not controlled by your nausea medication, call the clinic.   BELOW ARE SYMPTOMS THAT SHOULD BE REPORTED IMMEDIATELY:  *FEVER GREATER THAN 100.5 F  *CHILLS WITH OR WITHOUT FEVER  NAUSEA AND VOMITING THAT IS NOT CONTROLLED WITH YOUR NAUSEA MEDICATION  *UNUSUAL SHORTNESS OF BREATH  *UNUSUAL BRUISING OR BLEEDING  TENDERNESS IN MOUTH AND THROAT WITH OR WITHOUT PRESENCE OF ULCERS  *URINARY PROBLEMS  *BOWEL PROBLEMS  UNUSUAL RASH Items with * indicate a potential emergency and should be followed up as soon as possible.  Feel free to call the clinic should you have any questions or concerns. The clinic phone number is [phone].  Please show the [place] at check-in to the Emergency Department and triage nurse.

## 2020-10-15 NOTE — Telephone Encounter (Signed)
Scheduled appointments per 3/14 sch msg. Spoke to patient who is aware of appointments dates and times.

## 2020-10-15 NOTE — Assessment & Plan Note (Signed)
This is multifactorial, likely anemia of chronic disease She is not symptomatic Observe for now 

## 2020-10-15 NOTE — Assessment & Plan Note (Signed)
Her pain is better controlled I recommend gentle taper of her pain medicine

## 2020-10-15 NOTE — Assessment & Plan Note (Signed)
When I saw her in the clinic, overall, I felt that she is improving and responding to treatment She has minimum pain and no longer have symptoms of nausea or vomiting At the end of the day, unfortunately, she has developed carboplatin allergy reaction Her symptoms resolved by the end of today but I will enter carboplatin as an allergy and will omit carboplatin from future treatment

## 2020-10-15 NOTE — Progress Notes (Signed)
During the first 15 minutes of carboplatin infusion, patient became short of breath and tachycardic. Carboplatin was paused, and 1L normal saline was hung to gravity. Patient placed on 3L O2 and 125mg  solu-medrol, 15mg  benadryl, 20mg  pepcid administered. Patient also complains of lower abdominal pain.Lucianne Lei, PA, to bedside. Within 30 minutes, patient verbalized that she was feeling better and vitals WDL. Patient remains on 3L O2 for comfort. Per Lucianne Lei, PA, administer 1 L normal saline to gravity.  Oxygen removed and patient tolerating room air prior to discharge. Patient reports abdominal pain has subsided. Upon discharge, vitals WNL and patient in no distress.

## 2020-10-15 NOTE — Progress Notes (Signed)
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) When I saw her in the clinic, overall, I felt that she is improving and responding to treatment She has minimum pain and no longer have symptoms of nausea or vomiting At the end of the day, unfortunately, she has developed carboplatin allergy reaction Her symptoms resolved by the end of today but I will enter carboplatin as an allergy and will omit carboplatin from future treatment  Anemia due to antineoplastic chemotherapy This is multifactorial, likely anemia of chronic disease She is not symptomatic Observe for now  Cancer associated pain Her pain is better controlled I recommend gentle taper of her pain medicine  Allergic reaction Her allergic reaction is managed appropriately while in the infusion area As above, I will plan to omit carboplatin from future treatment   No orders of the defined types were placed in this encounter.   All questions were answered. The patient knows to call the clinic with any problems, questions or concerns. The total time spent in the appointment was 30 minutes encounter with patients including review of chart and various tests results, discussions about plan of care and coordination of care plan   Heath Lark, MD 10/15/2020 7:04 PM  INTERVAL HISTORY: Please see below for problem oriented charting. She returns for cycle 2 of treatment She is improved On average, she only took 2 pain medicine per day Her pain is really improved She had no further nausea vomiting Her appetite is good even though she has lost a bit of weight She denies recent constipation  SUMMARY OF ONCOLOGIC HISTORY: Oncology History Overview Note  Clear cell features Negative genetics   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[facility])  07/02/2019 Imaging   Ct scan of abdomen and  pelvis 1. There is a 16 cm complex cystic mass in the pelvis containing thickened septations located near midline, abutting the superior aspect of the uterus. I suspect this mass is adnexal/ovarian in origin rather than uterine in origin. Specifically, I suspect the mass likely arises from the left ovary/adnexa and is most consistent with a neoplasm. Malignancy is certainly not excluded on this study. Recommend a pelvic ultrasound for further evaluation. 2. The rounded more solid-appearing structure in the right side of the pelvis is probably the right ovary. Recommend attention on ultrasound. 3. The small amount of fluid in the pelvis is likely reactive to the complex cystic mass likely rising from the left ovary/adnexa. 4. No cause for blood in vomit or black stools identified. No convincing evidence of a perforated or inflamed gastric or duodenal ulcer. 5. Hepatic steatosis.   07/02/2019 Imaging   MRI pelvis 1. 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 There are 2 enhancing nodules within the central upper abdomen raising the possibility of peritoneal carcinomatosis. Additionally, there is a small amount fluid within the pelvis. Malignant ascites not excluded. 2. Fibroid uterus. 3. Hepatic steatosis.   07/02/2019 Imaging   US pelvis 1. There is a large mass in the pelvis also seen on CT imaging. A separate left ovary is not visualized. 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 The mass is suspicious for malignancy. Recommend gynecologic consultation. 2. The right ovary demonstrates an irregular ill-defined hypoechoic hypervascular region  centrally. The findings are nonspecific in the right ovary. However, given the apparent left ovarian neoplasm suspicious for malignancy, recommend an MRI to further assess the right ovary. 3. Uterine fibroid measuring 3.6 cm.      07/21/2019 Pathology Results   FINAL MICROSCOPIC DIAGNOSIS: A. 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 - See oncology table. B. OMENTUM, RESECTION: - Omental lymph node with metastatic carcinoma (1/1). - Omental adipose tissue with foci of inflammation and reactive mesothelial changes. C. FALLOPIAN TUBE, LEFT, RESECTION: - Fallopian tube with focal, mild inflammation and fibrosis. - No tumor identified. D. UTERUS, CERVIX, RIGHT TUBE AND OVARY: - Cervix Nabothian cyst and squamous metaplasia. - Endometrium Proliferative. No hyperplasia or carcinoma. - Myometrium Leiomyomata. No malignancy. -Right ovary Poorly differentiated carcinoma, 4.8 cm. See oncology table and comment. -Right Fallopian tube Benign paratubal cyst. No endometriosis or malignancy. ONCOLOGY TABLE: OVARY or FALLOPIAN TUBE or PRIMARY PERITONEUM: Procedure: Bilateral f-oophorectomy, hysterectomy and omentectomy. Specimen Integrity: Intact Tumor Site: Left ovary and right ovary. See comment. Ovarian Surface Involvement: Not identified. Fallopian Tube Surface Involvement: Not identified. Tumor Size: Left ovary: 18.6 x 16.0 x 7.2 cm. Right ovary: 4.8 x 3.2 x 3.2 cm. Histologic Type: Clear cell carcinoma. See comment. Histologic Grade: High-grade. Other Tissue/ Organ Involvement: Omental lymph node with metastatic carcinoma. Peritoneal/Ascitic Fluid: Pending. Treatment Effect: No known presurgical therapy. Pathologic Stage Classification (pTNM, AJCC 8th Edition): pT3a, pN1a. Representative Tumor Block: A2, A3, A4, A5 A6, A7 and A8. Comment(s): The left ovarian tumor is 18.6 cm in greatest dimension and has features of clear cell carcinoma. The right ovarian tumor is 4.8 cm in greatest dimension and is poorly differentiated with focal features consistent with clear cell carcinoma. The pattern of involvement suggests that the right ovarian tumor is likely metastatic from the  larger left ovarian clear cell carcinoma. Sections of the omentum show a lymph node with metastatic carcinoma. Sections of the remainder of the omentum do not show involvement by carcinoma.   07/21/2019 Surgery   Procedure(s) Performed: Exploratory laparotomy with radical tumor debulking including total hysterectomy, bilateral salpingo-oophorectomy, infra-gastric omentectomy, and washings.   Surgeon: Jeral Pinch, MD    Operative Findings: On EUA, mobile uterus and ovarian mass, situated out of the pelvis, spanning 6-8 cm above the umbilicus.  On intra-abdominal entry, inflamed omentum adherent to much of the 16 cm left ovarian mass, adhesions easily lysed bluntly.  No nodularity of the ovarian mass however on delivery of the mass through the midline incision, there was Intra-Op rupture of one of the smaller cystic components with drainage of clear brown-tinged fluid.  Grossly normal-appearing left fallopian tube as well as right tube.  Right ovary mildly enlarged measuring approximately 4 cm and firm/nodular, suspicious for tumor involvement.  Uterus 8-10 cm with 4 cm anterior mid body fibroid.  Some very small volume miliary disease along right pelvic sidewall, removed with uterine specimen.  Evidence of small diverticular disease.  Small bowel normal appearing although multiple loops of bowel adherent to each other, especially by mesentery, and an inflammatory manner.  An approximately 2 x 2 centimeter nodule suspicious for tumor implant was found in the omentum just inferior to the greater curvature of the stomach.  Additional small (less than 5 mm) implants noted on the stomach and posterior aspect of the left lobe of the liver.  Otherwise the liver surface and diaphragm were smooth.   08/03/2019 Cancer Staging   Staging form: Ovary, Fallopian Tube, and Primary Peritoneal  Carcinoma, AJCC 8th Edition - Pathologic: Stage IIIA2 (pT3a, pN1, cM0) - Signed by Heath Lark, MD on 08/03/2019   08/12/2019  Procedure   Placement of single lumen port a cath via right internal jugular vein. The catheter tip lies at the cavo-atrial junction. A power injectable port a cath was placed and is ready for immediate use.   08/22/2019 -  Chemotherapy   The patient had carboplatin and taxol for chemotherapy treatment.     09/23/2019 Genetic Testing   Negative genetic testing:  No pathogenic variants detected on the [facility] TumorNext-HRD+CancerNext paired germline and somatic genetic test. The report date is 09/23/2019.  The TumorNext-HRD+CancerNext test offered by [facility] includes paired germline and tumor analyses of 11 genes associated with homologous recombination repair (ATM, BARD1, BRIP1, CHEK2, MRE11A, NBN, PALB2, RAD51C, RAD51D, BRCA1, BRCA2) plus germline analyses of 26 additional genes associated with hereditary cancer (APC, AXIN2, BMPR1A, CDH1, CDK4, CDKN2A, DICER1, HOXB13, EPCAM, GREM1, MLH1, MSH2, MSH3, MSH6, MUTYH, NF1, NTHL1, PMS2, POLD1, POLE, PTEN, RECQL, SMAD4, SMARCA4, STK11, and TP53).   11/14/2019 Tumor Marker   Patient's tumor was tested for the following markers: CA-125 Results of the tumor marker test revealed 10.2.   12/05/2019 Tumor Marker   Patient's tumor was tested for the following markers: CA-125 Results of the tumor marker test revealed 9.9   01/05/2020 Imaging   No specific findings of active malignancy. Interval resection of the pelvic mass. Subtle nodularity along the right side of the vaginal cuff merits surveillance.     01/05/2020 Tumor Marker   Patient's tumor was tested for the following markers: CA-125 Results of the tumor marker test revealed 9.7   02/17/2020 Tumor Marker   Patient's tumor was tested for the following markers: CA-125 Results of the tumor marker test revealed 9.6   04/05/2020 Imaging   1. There is some minimal stranding along the mesentery and omentum without overt omental caking or well-defined nodularity. Faint bandlike density along the anterior peritoneal  reflection in the pelvis, slightly more notable than on prior. Overall the appearance is not considered specific for recurrence but given the slight increase in prominence from 01/05/2020, would encourage careful surveillance by tumor markers and imaging. 2. Mild lower lumbar degenerative facet arthropathy.   04/05/2020 Tumor Marker   Patient's tumor was tested for the following markers: CA-125 Results of the tumor marker test revealed 11   05/29/2020 Tumor Marker   Patient's tumor was tested for the following markers: CA-125. Results of the tumor marker test revealed 17.   07/04/2020 Tumor Marker   Patient's tumor was tested for the following markers: CA-125. Results of the tumor marker test revealed 20.1   07/16/2020 Imaging   Stable mild stranding throughout mesenteric and omental fat, without evidence of discrete masses or ascites. No evidence of new or progressive disease.   08/29/2020 Tumor Marker   Patient's tumor was tested for the following markers: CA-125 Results of the tumor marker test revealed 32.7.   09/07/2020 Imaging   1. Interval progression of peritoneal disease within the abdomen and pelvis. Tumor predominantly involves the serosal surface of the large and small bowel loops. No signs of bowel obstruction identified at this time. 2. No evidence for metastatic disease to the chest.   09/19/2020 Imaging   1. No acute findings identified within the abdomen or pelvis. No evidence for bowel obstruction. 2. Similar appearance of soft tissue thickening along the peritoneal reflections within the abdomen and pelvis compatible with known peritoneal disease. Recently characterized increased  soft tissue along the surface of the jejunal bowel loops and tethering of pelvic small bowel loops is difficult to quantify (and compared with previous exam) due to lack of IV contrast material.     09/20/2020 -  Chemotherapy    Patient is on Treatment Plan: OVARIAN RECURRENT 3RD LINE CARBOPLATIN  D1 / GEMCITABINE D1,8 (4/800) Q21D      10/01/2020 Tumor Marker   Patient's tumor was tested for the following markers: CA-125. Results of the tumor marker test revealed 37.2.     REVIEW OF SYSTEMS:   Constitutional: Denies fevers, chills or abnormal weight loss Eyes: Denies blurriness of vision Ears, nose, mouth, throat, and face: Denies mucositis or sore throat Respiratory: Denies cough, dyspnea or wheezes Cardiovascular: Denies palpitation, chest discomfort or lower extremity swelling Gastrointestinal:  Denies nausea, heartburn or change in bowel habits Skin: Denies abnormal skin rashes Lymphatics: Denies new lymphadenopathy or easy bruising Neurological:Denies numbness, tingling or new weaknesses Behavioral/Psych: Mood is stable, no new changes  All other systems were reviewed with the patient and are negative.  I have reviewed the past medical history, past surgical history, social history and family history with the patient and they are unchanged from previous note.  ALLERGIES:  is allergic to carboplatin.  MEDICATIONS:  Current Outpatient Medications  Medication Sig Dispense Refill  . acetaminophen (TYLENOL) 500 MG tablet Take 1,000 mg by mouth every 6 (six) hours as needed for moderate pain.     . famotidine (PEPCID) 20 MG tablet Take 1 tablet (20 mg total) by mouth 2 (two) times daily. 60 tablet 1  . lidocaine-prilocaine (EMLA) cream Apply to affected area once (Patient taking differently: Apply 1 application topically as directed. Apply to affected area once. Access Port) 30 g 3  . metFORMIN (GLUCOPHAGE) 500 MG tablet TAKE 1 TABLET BY MOUTH TWICE DAILY WITH MEALS 60 tablet 1  . morphine (MSIR) 30 MG tablet Take 1 tablet (30 mg total) by mouth every 4 (four) hours as needed for severe pain. 60 tablet 0  . Multiple Vitamin (MULTIVITAMIN WITH MINERALS) TABS tablet Take 1 tablet by mouth daily.    . ondansetron (ZOFRAN) 8 MG tablet Take 8 mg by mouth every 8 (eight) hours as  needed for nausea or vomiting.    . pantoprazole (PROTONIX) 40 MG tablet Take 1 tablet (40 mg total) by mouth 2 (two) times daily. 60 tablet 11  . promethazine (PHENERGAN) 25 MG tablet Take 25 mg by mouth every 6 (six) hours as needed for nausea or vomiting.     No current facility-administered medications for this visit.   Facility-Administered Medications Ordered in Other Visits  Medication Dose Route Frequency Provider Last Rate Last Admin  . sodium chloride flush (NS) 0.9 % injection 10 mL  10 mL Intracatheter PRN Alvy Bimler, Daiki Dicostanzo, MD   10 mL at 10/15/20 1524    PHYSICAL EXAMINATION: ECOG PERFORMANCE STATUS: 1 - Symptomatic but completely ambulatory  Vitals:   10/15/20 1019  BP: 109/83  Pulse: 98  Resp: 18  Temp: (!) 97.4 F (36.3 C)  SpO2: 100%   Filed Weights   10/15/20 1019  Weight: 205 lb 9.6 oz (93.3 kg)    GENERAL:alert, no distress and comfortable SKIN: skin color, texture, turgor are normal, no rashes or significant lesions EYES: normal, Conjunctiva are pink and non-injected, sclera clear OROPHARYNX:no exudate, no erythema and lips, buccal mucosa, and tongue normal  NECK: supple, thyroid normal size, non-tender, without nodularity LYMPH:  no palpable lymphadenopathy in the  cervical, axillary or inguinal LUNGS: clear to auscultation and percussion with normal breathing effort HEART: regular rate & rhythm and no murmurs and no lower extremity edema ABDOMEN:abdomen soft, non-tender and normal bowel sounds Musculoskeletal:no cyanosis of digits and no clubbing  NEURO: alert & oriented x 3 with fluent speech, no focal motor/sensory deficits  LABORATORY DATA:  I have reviewed the data as listed    Component Value Date/Time   NA 144 10/15/2020 1008   K 3.8 10/15/2020 1008   CL 106 10/15/2020 1008   CO2 26 10/15/2020 1008   GLUCOSE 131 (H) 10/15/2020 1008   BUN 8 10/15/2020 1008   CREATININE 0.73 10/15/2020 1008   CALCIUM 9.6 10/15/2020 1008   PROT 7.4 10/15/2020  1008   ALBUMIN 3.4 (L) 10/15/2020 1008   AST 23 10/15/2020 1008   ALT 25 10/15/2020 1008   ALKPHOS 131 (H) 10/15/2020 1008   BILITOT 0.2 (L) 10/15/2020 1008   GFRNONAA >60 10/15/2020 1008   GFRAA >60 04/05/2020 1008    No results found for: SPEP, UPEP  Lab Results  Component Value Date   WBC 6.1 10/15/2020   NEUTROABS 3.9 10/15/2020   HGB 9.9 (L) 10/15/2020   HCT 30.8 (L) 10/15/2020   MCV 86.0 10/15/2020   PLT 372 10/15/2020      Chemistry      Component Value Date/Time   NA 144 10/15/2020 1008   K 3.8 10/15/2020 1008   CL 106 10/15/2020 1008   CO2 26 10/15/2020 1008   BUN 8 10/15/2020 1008   CREATININE 0.73 10/15/2020 1008      Component Value Date/Time   CALCIUM 9.6 10/15/2020 1008   ALKPHOS 131 (H) 10/15/2020 1008   AST 23 10/15/2020 1008   ALT 25 10/15/2020 1008   BILITOT 0.2 (L) 10/15/2020 1008       RADIOGRAPHIC STUDIES: I have personally reviewed the radiological images as listed and agreed with the findings in the report. CT ABDOMEN PELVIS W CONTRAST  Result Date: 09/19/2020 CLINICAL DATA:  Abdominal pain for 3 weeks. Ovarian cancer. Rule out bowel obstruction. EXAM: CT ABDOMEN AND PELVIS WITH CONTRAST TECHNIQUE: Multidetector CT imaging of the abdomen and pelvis was performed using the standard protocol following bolus administration of intravenous contrast. CONTRAST:  166m OMNIPAQUE IOHEXOL 300 MG/ML  SOLN COMPARISON:  09/07/2020 FINDINGS: Lower chest: No acute abnormality. Hepatobiliary: No focal liver abnormality is seen. Status post cholecystectomy. No biliary dilatation. Pancreas: Unremarkable. No pancreatic ductal dilatation or surrounding inflammatory changes. Spleen: Normal in size without focal abnormality. Adrenals/Urinary Tract: Normal adrenal glands. No kidney mass or hydronephrosis identified. Urinary bladder is unremarkable. Stomach/Bowel: Stomach is within normal limits. Appendix appears normal. Similar appearance of soft tissue thickening along  the serosal surface of small and large bowel loops consistent with peritoneal disease. Subjectively this is similar to the previous exam. Vascular/Lymphatic: Normal appearance of the abdominal aorta. No aneurysm. No abdominopelvic adenopathy identified. Reproductive: Status post hysterectomy. No adnexal masses. Other: There is no ascites. Soft tissue thickening is again noted along the peritoneal reflections within the abdomen and pelvis compatible with known peritoneal disease. The appearance is similar to the previous exam. Interloop soft tissue thickening with tethering of pelvic small bowel loops is difficult to quantify in compared with previous exam reflecting lack of enteric contrast material. Previously described soft tissue thickening along the serosal surface of the proximal transverse colon is similar to previous exam, image 45/5. Recently characterized increased soft tissue thickening along the surface of the jejunal  bowel loops is difficult to visualize, also reflecting lack of IV contrast material. Musculoskeletal: No acute or significant osseous findings. IMPRESSION: 1. No acute findings identified within the abdomen or pelvis. No evidence for bowel obstruction. 2. Similar appearance of soft tissue thickening along the peritoneal reflections within the abdomen and pelvis compatible with known peritoneal disease. Recently characterized increased soft tissue along the surface of the jejunal bowel loops and tethering of pelvic small bowel loops is difficult to quantify (and compared with previous exam) due to lack of IV contrast material. Electronically Signed   By: Kerby Moors M.D.   On: 09/19/2020 14:15

## 2020-10-16 ENCOUNTER — Telehealth: Payer: Self-pay

## 2020-10-16 ENCOUNTER — Telehealth: Payer: Self-pay | Admitting: Medical

## 2020-10-16 LAB — CA 125: Cancer Antigen (CA) 125: 48 U/mL — ABNORMAL HIGH (ref 0.0–38.1)

## 2020-10-16 NOTE — Telephone Encounter (Signed)
Checked out appointment. No LOS notes needing to be scheduled. No changes made. 

## 2020-10-16 NOTE — Telephone Encounter (Signed)
Called and given below message. She verbalized understanding She is a little tired today. She is gong to try rest today. Complaining of being achy. She is taking tylenol and it seems to be helping. Instructed to call the office back if needed. She verbalized understanding.

## 2020-10-16 NOTE — Telephone Encounter (Signed)
-----   Message from Heath Lark, MD sent at 10/16/2020  8:37 AM EDT ----- Can you call and check on her? She had allergic rxn to [place] I will explain plan with her next week but for now I just want to make sure she feels ok

## 2020-10-16 NOTE — Progress Notes (Signed)
    DATE:  10/15/2020                                          X  CHEMO/IMMUNOTHERAPY REACTION           MD:  Dr. Heath Lark   AGENT/BLOOD PRODUCT RECEIVING TODAY:               Carboplatin and gemcitabine   AGENT/BLOOD PRODUCT RECEIVING IMMEDIATELY PRIOR TO REACTION:           Carboplatin (8th total treatment)   VS: BP:      157/106   P:        133       SPO2:        100% on room air T: 97.9                BP:      137/95   P:        117       SPO2:        100% on room air T: 97.9   BP:      118/81   P:        110       SPO2:        100% on room air  T: 97.8   REACTION(S):            Shortness of breath, chest tightness, and abdominal pain   PREMEDS:      Doxy, Benadryl 25 mg p.o., dexamethasone 10 mg, Emend, and Pepcid 20 mg IV   INTERVENTION: Carboplatin was paused patient was given Pepcid 20 mg IV x1, Benadryl 25 mg IV x1, and Solu-Medrol 125 mg IV x1.   Review of Systems  Review of Systems  Constitutional: Negative for chills, diaphoresis and fever.  HENT: Negative for trouble swallowing and voice change.   Respiratory: Positive for chest tightness and shortness of breath. Negative for cough and wheezing.   Cardiovascular: Negative for chest pain and palpitations.  Gastrointestinal: Positive for abdominal pain. Negative for constipation, diarrhea, nausea and vomiting.  Musculoskeletal: Negative for back pain and myalgias.  Neurological: Negative for dizziness, light-headedness and headaches.     Physical Exam  Physical Exam Constitutional:      General: She is not in acute distress.    Appearance: She is not diaphoretic.  HENT:     Head: Normocephalic and atraumatic.  Cardiovascular:     Rate and Rhythm: Normal rate and regular rhythm.     Heart sounds: Normal heart sounds. No murmur heard. No friction rub. No gallop.   Pulmonary:     Effort: Pulmonary effort is normal. No respiratory distress.     Breath sounds: Normal breath sounds. No wheezing or rales.  Skin:     General: Skin is warm and dry.     Findings: No erythema or rash.  Neurological:     Mental Status: She is alert.     OUTCOME:                 The patient's symptoms ultimately abated.  Carboplatin is being discontinued.  She will continue to receive gemcitabine for now.  She will return as scheduled.  This case was discussed with Dr. Alvy Bimler. She expresses agreement with my management of this patient.   Sandi Mealy, MHS, PA-C

## 2020-10-22 ENCOUNTER — Inpatient Hospital Stay (HOSPITAL_BASED_OUTPATIENT_CLINIC_OR_DEPARTMENT_OTHER): Payer: 59 | Admitting: Hematology and Oncology

## 2020-10-22 ENCOUNTER — Inpatient Hospital Stay: Payer: 59

## 2020-10-22 ENCOUNTER — Other Ambulatory Visit: Payer: Self-pay

## 2020-10-22 ENCOUNTER — Encounter: Payer: Self-pay | Admitting: Hematology and Oncology

## 2020-10-22 ENCOUNTER — Other Ambulatory Visit: Payer: Self-pay | Admitting: Hematology and Oncology

## 2020-10-22 VITALS — HR 99

## 2020-10-22 VITALS — BP 124/86 | HR 18 | Temp 98.3°F | Resp 18 | Ht 64.0 in | Wt 200.6 lb

## 2020-10-22 DIAGNOSIS — E669 Obesity, unspecified: Secondary | ICD-10-CM

## 2020-10-22 DIAGNOSIS — C562 Malignant neoplasm of left ovary: Secondary | ICD-10-CM

## 2020-10-22 DIAGNOSIS — T451X5A Adverse effect of antineoplastic and immunosuppressive drugs, initial encounter: Secondary | ICD-10-CM

## 2020-10-22 DIAGNOSIS — D6481 Anemia due to antineoplastic chemotherapy: Secondary | ICD-10-CM

## 2020-10-22 DIAGNOSIS — R748 Abnormal levels of other serum enzymes: Secondary | ICD-10-CM | POA: Diagnosis not present

## 2020-10-22 DIAGNOSIS — R1900 Intra-abdominal and pelvic swelling, mass and lump, unspecified site: Secondary | ICD-10-CM

## 2020-10-22 DIAGNOSIS — G893 Neoplasm related pain (acute) (chronic): Secondary | ICD-10-CM

## 2020-10-22 LAB — CBC WITH DIFFERENTIAL (CANCER CENTER ONLY)
Abs Immature Granulocytes: 0.03 10*3/uL (ref 0.00–0.07)
Basophils Absolute: 0 10*3/uL (ref 0.0–0.1)
Basophils Relative: 0 %
Eosinophils Absolute: 0 10*3/uL (ref 0.0–0.5)
Eosinophils Relative: 1 %
HCT: 29 % — ABNORMAL LOW (ref 36.0–46.0)
Hemoglobin: 9.4 g/dL — ABNORMAL LOW (ref 12.0–15.0)
Immature Granulocytes: 1 %
Lymphocytes Relative: 34 %
Lymphs Abs: 1.4 10*3/uL (ref 0.7–4.0)
MCH: 27.6 pg (ref 26.0–34.0)
MCHC: 32.4 g/dL (ref 30.0–36.0)
MCV: 85 fL (ref 80.0–100.0)
Monocytes Absolute: 0.2 10*3/uL (ref 0.1–1.0)
Monocytes Relative: 4 %
Neutro Abs: 2.6 10*3/uL (ref 1.7–7.7)
Neutrophils Relative %: 60 %
Platelet Count: 388 10*3/uL (ref 150–400)
RBC: 3.41 MIL/uL — ABNORMAL LOW (ref 3.87–5.11)
RDW: 12.3 % (ref 11.5–15.5)
WBC Count: 4.2 10*3/uL (ref 4.0–10.5)
nRBC: 0 % (ref 0.0–0.2)

## 2020-10-22 LAB — CMP (CANCER CENTER ONLY)
ALT: 50 U/L — ABNORMAL HIGH (ref 0–44)
AST: 34 U/L (ref 15–41)
Albumin: 3.5 g/dL (ref 3.5–5.0)
Alkaline Phosphatase: 111 U/L (ref 38–126)
Anion gap: 10 (ref 5–15)
BUN: 10 mg/dL (ref 6–20)
CO2: 25 mmol/L (ref 22–32)
Calcium: 9.3 mg/dL (ref 8.9–10.3)
Chloride: 104 mmol/L (ref 98–111)
Creatinine: 0.71 mg/dL (ref 0.44–1.00)
GFR, Estimated: >60 mL/min (ref >60)
Glucose, Bld: 148 mg/dL — ABNORMAL HIGH (ref 70–99)
Potassium: 3.4 mmol/L — ABNORMAL LOW (ref 3.5–5.1)
Sodium: 139 mmol/L (ref 135–145)
Total Bilirubin: 0.2 mg/dL — ABNORMAL LOW (ref 0.3–1.2)
Total Protein: 7.1 g/dL (ref 6.5–8.1)

## 2020-10-22 MED ORDER — PROCHLORPERAZINE MALEATE 10 MG PO TABS
ORAL_TABLET | ORAL | Status: AC
  Filled 2020-10-22: qty 1

## 2020-10-22 MED ORDER — SODIUM CHLORIDE 0.9% FLUSH
10.0000 mL | Freq: Once | INTRAVENOUS | Status: AC
  Administered 2020-10-22: 10 mL
  Filled 2020-10-22: qty 10

## 2020-10-22 MED ORDER — LIDOCAINE-PRILOCAINE 2.5-2.5 % EX CREA: 1.0000 "application " | TOPICAL_CREAM | CUTANEOUS | 11 refills | Status: DC

## 2020-10-22 MED ORDER — HEPARIN SOD (PORK) LOCK FLUSH 100 UNIT/ML IV SOLN
500.0000 [IU] | Freq: Once | INTRAVENOUS | Status: AC | PRN
  Administered 2020-10-22: 500 [IU]
  Filled 2020-10-22: qty 5

## 2020-10-22 MED ORDER — ONDANSETRON HCL 8 MG PO TABS: 8.0000 mg | ORAL_TABLET | Freq: Three times a day (TID) | ORAL | 1 refills | Status: DC | PRN

## 2020-10-22 MED ORDER — SODIUM CHLORIDE 0.9 % IV SOLN
800.0000 mg/m2 | Freq: Once | INTRAVENOUS | Status: AC
  Administered 2020-10-22: 1634 mg via INTRAVENOUS
  Filled 2020-10-22: qty 42.98

## 2020-10-22 MED ORDER — SODIUM CHLORIDE 0.9 % IV SOLN
Freq: Once | INTRAVENOUS | Status: AC
  Filled 2020-10-22: qty 250

## 2020-10-22 MED ORDER — PROCHLORPERAZINE MALEATE 10 MG PO TABS
10.0000 mg | ORAL_TABLET | Freq: Once | ORAL | Status: AC
  Administered 2020-10-22: 10 mg via ORAL

## 2020-10-22 MED ORDER — SODIUM CHLORIDE 0.9% FLUSH
10.0000 mL | INTRAVENOUS | Status: DC | PRN
  Administered 2020-10-22: 10 mL
  Filled 2020-10-22: qty 10

## 2020-10-22 MED FILL — ONDANSETRON HCL 8 MG TABLET: 8 | 20 days supply | Qty: 60 | Fill #0

## 2020-10-22 NOTE — Assessment & Plan Note (Signed)
She has minimum cancer associated pain I recommend pain medicine taper

## 2020-10-22 NOTE — Assessment & Plan Note (Signed)
She is worried about her progressive weight loss For now, observe closely

## 2020-10-22 NOTE — Assessment & Plan Note (Addendum)
Unfortunately, she has developed carboplatin allergy We will proceed with single agent gemcitabine only today For her next cycle of treatment, I recommend additional bevacizumab as previously planned I recommend another 2 months of treatment before repeating CT imaging Clinically, she appears to be responding to treatment  The risks, benefits, side effects of additional bevacizumab were discussed and she is in agreement to proceed

## 2020-10-22 NOTE — Assessment & Plan Note (Signed)
She has intermittent elevated liver enzymes likely due to treatment She is not symptomatic Observe

## 2020-10-22 NOTE — Assessment & Plan Note (Signed)
This is multifactorial, likely anemia of chronic disease She is not symptomatic Observe for now 

## 2020-10-22 NOTE — Patient Instructions (Signed)
Gemcitabine injection What is this medicine? GEMCITABINE (jem SYE ta been) is a chemotherapy drug. This medicine is used to treat many types of cancer like breast cancer, lung cancer, pancreatic cancer, and ovarian cancer. This medicine may be used for other purposes; ask your health care provider or pharmacist if you have questions. COMMON BRAND NAME(S): Gemzar, Infugem What should I tell my health care provider before I take this medicine? They need to know if you have any of these conditions:  blood disorders  infection  kidney disease  liver disease  lung or breathing disease, like asthma  recent or ongoing radiation therapy  an unusual or allergic reaction to gemcitabine, other chemotherapy, other medicines, foods, dyes, or preservatives  pregnant or trying to get pregnant  breast-feeding How should I use this medicine? This drug is given as an infusion into a vein. It is administered in a hospital or clinic by a specially trained health care professional. Talk to your pediatrician regarding the use of this medicine in children. Special care may be needed. Overdosage: If you think you have taken too much of this medicine contact a poison control center or emergency room at once. NOTE: This medicine is only for you. Do not share this medicine with others. What if I miss a dose? It is important not to miss your dose. Call your doctor or health care professional if you are unable to keep an appointment. What may interact with this medicine?  medicines to increase blood counts like filgrastim, pegfilgrastim, sargramostim  some other chemotherapy drugs like cisplatin  vaccines Talk to your doctor or health care professional before taking any of these medicines:  acetaminophen  aspirin  ibuprofen  ketoprofen  naproxen This list may not describe all possible interactions. Give your health care provider a list of all the medicines, herbs, non-prescription drugs, or  dietary supplements you use. Also tell them if you smoke, drink alcohol, or use illegal drugs. Some items may interact with your medicine. What should I watch for while using this medicine? Visit your doctor for checks on your progress. This drug may make you feel generally unwell. This is not uncommon, as chemotherapy can affect healthy cells as well as cancer cells. Report any side effects. Continue your course of treatment even though you feel ill unless your doctor tells you to stop. In some cases, you may be given additional medicines to help with side effects. Follow all directions for their use. Call your doctor or health care professional for advice if you get a fever, chills or sore throat, or other symptoms of a cold or flu. Do not treat yourself. This drug decreases your body's ability to fight infections. Try to avoid being around people who are sick. This medicine may increase your risk to bruise or bleed. Call your doctor or health care professional if you notice any unusual bleeding. Be careful brushing and flossing your teeth or using a toothpick because you may get an infection or bleed more easily. If you have any dental work done, tell your dentist you are receiving this medicine. Avoid taking products that contain aspirin, acetaminophen, ibuprofen, naproxen, or ketoprofen unless instructed by your doctor. These medicines may hide a fever. Do not become pregnant while taking this medicine or for 6 months after stopping it. Women should inform their doctor if they wish to become pregnant or think they might be pregnant. Men should not father a child while taking this medicine and for 3 months after stopping it.   There is a potential for serious side effects to an unborn child. Talk to your health care professional or pharmacist for more information. Do not breast-feed an infant while taking this medicine or for at least 1 week after stopping it. Men should inform their doctors if they wish  to father a child. This medicine may lower sperm counts. Talk with your doctor or health care professional if you are concerned about your fertility. What side effects may I notice from receiving this medicine? Side effects that you should report to your doctor or health care professional as soon as possible:  allergic reactions like skin rash, itching or hives, swelling of the face, lips, or tongue  breathing problems  pain, redness, or irritation at site where injected  signs and symptoms of a dangerous change in heartbeat or heart rhythm like chest pain; dizziness; fast or irregular heartbeat; palpitations; feeling faint or lightheaded, falls; breathing problems  signs of decreased platelets or bleeding - bruising, pinpoint red spots on the skin, black, tarry stools, blood in the urine  signs of decreased red blood cells - unusually weak or tired, feeling faint or lightheaded, falls  signs of infection - fever or chills, cough, sore throat, pain or difficulty passing urine  signs and symptoms of kidney injury like trouble passing urine or change in the amount of urine  signs and symptoms of liver injury like dark yellow or brown urine; general ill feeling or flu-like symptoms; light-colored stools; loss of appetite; nausea; right upper belly pain; unusually weak or tired; yellowing of the eyes or skin  swelling of ankles, feet, hands Side effects that usually do not require medical attention (report to your doctor or health care professional if they continue or are bothersome):  constipation  diarrhea  hair loss  loss of appetite  nausea  rash  vomiting This list may not describe all possible side effects. Call your doctor for medical advice about side effects. You may report side effects to FDA at 1-800-FDA-1088. Where should I keep my medicine? This drug is given in a hospital or clinic and will not be stored at home. NOTE: This sheet is a summary. It may not cover all  possible information. If you have questions about this medicine, talk to your doctor, pharmacist, or health care provider.  2021 Elsevier/Gold Standard (2017-10-14 18:06:11)  

## 2020-10-22 NOTE — Progress Notes (Signed)
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, she has developed carboplatin allergy We will proceed with single agent gemcitabine only today For her next cycle of treatment, I recommend additional bevacizumab as previously planned I recommend another 2 months of treatment before repeating CT imaging Clinically, she appears to be responding to treatment  The risks, benefits, side effects of additional bevacizumab were discussed and she is in agreement to proceed  Anemia due to antineoplastic chemotherapy This is multifactorial, 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 Total Protein, Urine dipstick    Standing Status:   Standing    Number of Occurrences:   9    Standing Expiration Date:   10/22/2021    All questions were answered. The patient knows to call the clinic with any problems, questions or concerns. The total time spent in the appointment was 30 minutes encounter with patients including review of chart and various tests results, discussions about plan of care and coordination of care plan   Heath Lark, MD 10/22/2020 1:40 PM  INTERVAL HISTORY: Please see below for problem oriented charting. 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 There is a 16 cm complex cystic mass in the pelvis containing thickened septations located near midline, abutting the superior aspect of the uterus. I suspect this mass is adnexal/ovarian in origin rather than uterine in origin. Specifically, I suspect the mass likely arises from the left ovary/adnexa and is most consistent with a neoplasm. Malignancy is certainly not excluded on this study. Recommend a pelvic ultrasound for further evaluation. 2. The rounded more solid-appearing structure in the right side of the pelvis is probably the right ovary. Recommend attention on ultrasound. 3. The small amount of fluid in the pelvis is likely reactive to the complex cystic mass likely rising from the left ovary/adnexa. 4. No cause for blood in vomit or black stools identified. No convincing evidence of a perforated or inflamed gastric or duodenal ulcer. 5. Hepatic steatosis.   07/02/2019 Imaging   MRI pelvis 1. 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 There are 2 enhancing nodules within the central upper abdomen raising the possibility of peritoneal carcinomatosis. Additionally, there is a small amount fluid within the pelvis. Malignant ascites not excluded. 2. Fibroid uterus. 3. Hepatic steatosis.   07/02/2019 Imaging   US pelvis 1. There is a large mass in the pelvis also seen on CT imaging. A separate left ovary is not visualized. I  suspect the large mass represents a large neoplasm arising from the left ovary. The mass is suspicious for malignancy. Recommend gynecologic consultation. 2. The right ovary demonstrates an irregular ill-defined hypoechoic hypervascular region centrally. The  findings are nonspecific in the right ovary. However, given the apparent left ovarian neoplasm suspicious for malignancy, recommend an MRI to further assess the right ovary. 3. Uterine fibroid measuring 3.6 cm.     07/21/2019 Pathology Results   FINAL MICROSCOPIC DIAGNOSIS: A. 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 - See oncology table. B. OMENTUM, RESECTION: - Omental lymph node with metastatic carcinoma (1/1). - Omental adipose tissue with foci of inflammation and reactive mesothelial changes. C. FALLOPIAN TUBE, LEFT, RESECTION: - Fallopian tube with focal, mild inflammation and fibrosis. - No tumor identified. D. UTERUS, CERVIX, RIGHT TUBE AND OVARY: - Cervix Nabothian cyst and squamous metaplasia. - Endometrium Proliferative. No hyperplasia or carcinoma. - Myometrium Leiomyomata. No malignancy. -Right ovary Poorly differentiated carcinoma, 4.8 cm. See oncology table and comment. -Right Fallopian tube Benign paratubal cyst. No endometriosis or malignancy. ONCOLOGY TABLE: OVARY or FALLOPIAN TUBE or PRIMARY PERITONEUM: Procedure: Bilateral f-oophorectomy, hysterectomy and omentectomy. Specimen Integrity: Intact Tumor Site: Left ovary and right ovary. See comment. Ovarian Surface Involvement: Not identified. Fallopian Tube Surface Involvement: Not identified. Tumor Size: Left ovary: 18.6 x 16.0 x 7.2 cm. Right ovary: 4.8 x 3.2 x 3.2 cm. Histologic Type: Clear cell carcinoma. See comment. Histologic Grade: High-grade. Other Tissue/ Organ Involvement: Omental lymph node with metastatic carcinoma. Peritoneal/Ascitic Fluid: Pending. Treatment Effect: No known presurgical therapy. Pathologic Stage Classification (pTNM, AJCC 8th Edition): pT3a, pN1a. Representative Tumor Block: A2, A3, A4, A5 A6, A7 and A8. Comment(s): The left ovarian tumor is 18.6 cm in greatest dimension and has features of clear cell carcinoma. The right ovarian  tumor is 4.8 cm in greatest dimension and is poorly differentiated with focal features consistent with clear cell carcinoma. The pattern of involvement suggests that the right ovarian tumor is likely metastatic from the larger left ovarian clear cell carcinoma. Sections of the omentum show a lymph node with metastatic carcinoma. Sections of the remainder of the omentum do not show involvement by carcinoma.   07/21/2019 Surgery   Procedure(s) Performed: Exploratory laparotomy with radical tumor debulking including total hysterectomy, bilateral salpingo-oophorectomy, infra-gastric omentectomy, and washings.   Surgeon: Jeral Pinch, MD    Operative Findings: On EUA, mobile uterus and ovarian mass, situated out of the pelvis, spanning 6-8 cm above the umbilicus.  On intra-abdominal entry, inflamed omentum adherent to much of the 16 cm left ovarian mass, adhesions easily lysed bluntly.  No nodularity of the ovarian mass however on delivery of the mass through the midline incision, there was Intra-Op rupture of one of the smaller cystic components with drainage of clear brown-tinged fluid.  Grossly normal-appearing left fallopian tube as well as right tube.  Right ovary mildly enlarged measuring approximately 4 cm and firm/nodular, suspicious for tumor involvement.  Uterus 8-10 cm with 4 cm anterior mid body fibroid.  Some very small volume miliary disease along right pelvic sidewall, removed with uterine specimen.  Evidence of small diverticular disease.  Small bowel normal appearing although multiple loops of bowel adherent to each other, especially by mesentery, and an inflammatory manner.  An approximately 2 x 2 centimeter nodule suspicious for tumor implant was found in the omentum just inferior to the greater curvature of the stomach.  Additional small (less than 5 mm) implants noted on the stomach and  posterior aspect of the left lobe of the liver.  Otherwise the liver surface and diaphragm were  smooth.   08/03/2019 Cancer Staging   Staging form: Ovary, Fallopian Tube, and Primary Peritoneal Carcinoma, AJCC 8th Edition - Pathologic: Stage IIIA2 (pT3a, pN1, cM0) - Signed by Heath Lark, MD on 08/03/2019   08/12/2019 Procedure   Placement of single lumen port a cath via right internal jugular vein. The catheter tip lies at the cavo-atrial junction. A power injectable port a cath was placed and is ready for immediate use.   08/22/2019 -  Chemotherapy   The patient had carboplatin and taxol for chemotherapy treatment.     09/23/2019 Genetic Testing   Negative genetic testing:  No pathogenic variants detected on the [facility] TumorNext-HRD+CancerNext paired germline and somatic genetic test. The report date is 09/23/2019.  The TumorNext-HRD+CancerNext test offered by [facility] includes paired germline and tumor analyses of 11 genes associated with homologous recombination repair (ATM, BARD1, BRIP1, CHEK2, MRE11A, NBN, PALB2, RAD51C, RAD51D, BRCA1, BRCA2) plus germline analyses of 26 additional genes associated with hereditary cancer (APC, AXIN2, BMPR1A, CDH1, CDK4, CDKN2A, DICER1, HOXB13, EPCAM, GREM1, MLH1, MSH2, MSH3, MSH6, MUTYH, NF1, NTHL1, PMS2, POLD1, POLE, PTEN, RECQL, SMAD4, SMARCA4, STK11, and TP53).   11/14/2019 Tumor Marker   Patient's tumor was tested for the following markers: CA-125 Results of the tumor marker test revealed 10.2.   12/05/2019 Tumor Marker   Patient's tumor was tested for the following markers: CA-125 Results of the tumor marker test revealed 9.9   01/05/2020 Imaging   No specific findings of active malignancy. Interval resection of the pelvic mass. Subtle nodularity along the right side of the vaginal cuff merits surveillance.     01/05/2020 Tumor Marker   Patient's tumor was tested for the following markers: CA-125 Results of the tumor marker test revealed 9.7   02/17/2020 Tumor Marker   Patient's tumor was tested for the following markers: CA-125 Results of the  tumor marker test revealed 9.6   04/05/2020 Imaging   1. There is some minimal stranding along the mesentery and omentum without overt omental caking or well-defined nodularity. Faint bandlike density along the anterior peritoneal reflection in the pelvis, slightly more notable than on prior. Overall the appearance is not considered specific for recurrence but given the slight increase in prominence from 01/05/2020, would encourage careful surveillance by tumor markers and imaging. 2. Mild lower lumbar degenerative facet arthropathy.   04/05/2020 Tumor Marker   Patient's tumor was tested for the following markers: CA-125 Results of the tumor marker test revealed 11   05/29/2020 Tumor Marker   Patient's tumor was tested for the following markers: CA-125. Results of the tumor marker test revealed 17.   07/04/2020 Tumor Marker   Patient's tumor was tested for the following markers: CA-125. Results of the tumor marker test revealed 20.1   07/16/2020 Imaging   Stable mild stranding throughout mesenteric and omental fat, without evidence of discrete masses or ascites. No evidence of new or progressive disease.   08/29/2020 Tumor Marker   Patient's tumor was tested for the following markers: CA-125 Results of the tumor marker test revealed 32.7.   09/07/2020 Imaging   1. Interval progression of peritoneal disease within the abdomen and pelvis. Tumor predominantly involves the serosal surface of the large and small bowel loops. No signs of bowel obstruction identified at this time. 2. No evidence for metastatic disease to the chest.   09/19/2020 Imaging   1. No acute findings identified within  the abdomen or pelvis. No evidence for bowel obstruction. 2. Similar appearance of soft tissue thickening along the peritoneal reflections within the abdomen and pelvis compatible with known peritoneal disease. Recently characterized increased soft tissue along the surface of the jejunal bowel loops and  tethering of pelvic small bowel loops is difficult to quantify (and compared with previous exam) due to lack of IV contrast material.     09/20/2020 -  Chemotherapy    Patient is on Treatment Plan: OVARIAN RECURRENT 3RD LINE CARBOPLATIN D1 / GEMCITABINE D1,8 (4/800) Q21D  Carboplatin was discontinued after 10/15/2020 due to allergic reaction      10/01/2020 Tumor Marker   Patient's tumor was tested for the following markers: CA-125. Results of the tumor marker test revealed 37.2.   10/15/2020 Tumor Marker   Patient's tumor was tested for the following markers: CA-125. Results of the tumor marker test revealed 48     REVIEW OF SYSTEMS:   Constitutional: Denies fevers, chills  Eyes: Denies blurriness of vision Ears, nose, mouth, throat, and face: Denies mucositis or sore throat Respiratory: Denies cough, dyspnea or wheezes Cardiovascular: Denies palpitation, chest discomfort or lower extremity swelling Gastrointestinal:  Denies nausea, heartburn or change in bowel habits Skin: Denies abnormal skin rashes Lymphatics: Denies new lymphadenopathy or easy bruising Neurological:Denies numbness, tingling or new weaknesses Behavioral/Psych: Mood is stable, no new changes  All other systems were reviewed with the patient and are negative.  I have reviewed the past medical history, past surgical history, social history and family history with the patient and they are unchanged from previous note.  ALLERGIES:  is allergic to carboplatin.  MEDICATIONS:  Current Outpatient Medications  Medication Sig Dispense Refill  . morphine (MSIR) 15 MG tablet Take 15 mg by mouth every 4 (four) hours as needed.    . prochlorperazine (COMPAZINE) 10 MG tablet Take 10 mg by mouth every 6 (six) hours as needed.    Marland Kitchen acetaminophen (TYLENOL) 500 MG tablet Take 1,000 mg by mouth every 6 (six) hours as needed for moderate pain.     Marland Kitchen lidocaine-prilocaine (EMLA) cream Apply 1 application topically as directed.  Apply to affected area once. Access Port 30 g 11  . metFORMIN (GLUCOPHAGE) 500 MG tablet TAKE 1 TABLET BY MOUTH TWICE DAILY WITH MEALS 60 tablet 1  . Multiple Vitamin (MULTIVITAMIN WITH MINERALS) TABS tablet Take 1 tablet by mouth daily.    . ondansetron (ZOFRAN) 8 MG tablet Take 1 tablet (8 mg total) by mouth every 8 (eight) hours as needed for nausea or vomiting. 60 tablet 1   No current facility-administered medications for this visit.   Facility-Administered Medications Ordered in Other Visits  Medication Dose Route Frequency Provider Last Rate Last Admin  . sodium chloride flush (NS) 0.9 % injection 10 mL  10 mL Intracatheter PRN Alvy Bimler, Swayzie Choate, MD   10 mL at 10/22/20 1314    PHYSICAL EXAMINATION: ECOG PERFORMANCE STATUS: 1 - Symptomatic but completely ambulatory  Vitals:   10/22/20 1051  BP: 124/86  Pulse: (!) 18  Resp: 18  Temp: 98.3 F (36.8 C)  SpO2: 100%   Filed Weights   10/22/20 1051  Weight: 200 lb 9.6 oz (91 kg)    GENERAL:alert, no distress and comfortable SKIN: skin color, texture, turgor are normal, no rashes or significant lesions EYES: normal, Conjunctiva are pink and non-injected, sclera clear OROPHARYNX:no exudate, no erythema and lips, buccal mucosa, and tongue normal  NECK: supple, thyroid normal size, non-tender, without nodularity LYMPH:  no palpable lymphadenopathy in the cervical, axillary or inguinal LUNGS: clear to auscultation and percussion with normal breathing effort HEART: regular rate & rhythm and no murmurs and no lower extremity edema ABDOMEN:abdomen soft, non-tender and normal bowel sounds Musculoskeletal:no cyanosis of digits and no clubbing  NEURO: alert & oriented x 3 with fluent speech, no focal motor/sensory deficits  LABORATORY DATA:  I have reviewed the data as listed    Component Value Date/Time   NA 139 10/22/2020 1043   K 3.4 (L) 10/22/2020 1043   CL 104 10/22/2020 1043   CO2 25 10/22/2020 1043   GLUCOSE 148 (H) 10/22/2020  1043   BUN 10 10/22/2020 1043   CREATININE 0.71 10/22/2020 1043   CALCIUM 9.3 10/22/2020 1043   PROT 7.1 10/22/2020 1043   ALBUMIN 3.5 10/22/2020 1043   AST 34 10/22/2020 1043   ALT 50 (H) 10/22/2020 1043   ALKPHOS 111 10/22/2020 1043   BILITOT 0.2 (L) 10/22/2020 1043   GFRNONAA >60 10/22/2020 1043   GFRAA >60 04/05/2020 1008    No results found for: SPEP, UPEP  Lab Results  Component Value Date   WBC 4.2 10/22/2020   NEUTROABS 2.6 10/22/2020   HGB 9.4 (L) 10/22/2020   HCT 29.0 (L) 10/22/2020   MCV 85.0 10/22/2020   PLT 388 10/22/2020      Chemistry      Component Value Date/Time   NA 139 10/22/2020 1043   K 3.4 (L) 10/22/2020 1043   CL 104 10/22/2020 1043   CO2 25 10/22/2020 1043   BUN 10 10/22/2020 1043   CREATININE 0.71 10/22/2020 1043      Component Value Date/Time   CALCIUM 9.3 10/22/2020 1043   ALKPHOS 111 10/22/2020 1043   AST 34 10/22/2020 1043   ALT 50 (H) 10/22/2020 1043   BILITOT 0.2 (L) 10/22/2020 1043

## 2020-10-23 LAB — CA 125: Cancer Antigen (CA) 125: 28.5 U/mL (ref 0.0–38.1)

## 2020-10-24 ENCOUNTER — Other Ambulatory Visit: Payer: Self-pay | Admitting: Hematology and Oncology

## 2020-10-25 MED FILL — LIDOCAINE-PRILOCAINE CREAM: 2.5-2.5 | 30 days supply | Qty: 30 | Fill #0

## 2020-11-05 ENCOUNTER — Other Ambulatory Visit: Payer: Self-pay

## 2020-11-05 ENCOUNTER — Inpatient Hospital Stay: Payer: Medicaid Other

## 2020-11-05 ENCOUNTER — Inpatient Hospital Stay: Payer: Medicaid Other | Attending: Gynecologic Oncology

## 2020-11-05 VITALS — BP 118/68 | HR 62 | Temp 98.2°F | Resp 18 | Wt 201.5 lb

## 2020-11-05 DIAGNOSIS — T451X5A Adverse effect of antineoplastic and immunosuppressive drugs, initial encounter: Secondary | ICD-10-CM | POA: Diagnosis not present

## 2020-11-05 DIAGNOSIS — Z5111 Encounter for antineoplastic chemotherapy: Secondary | ICD-10-CM | POA: Insufficient documentation

## 2020-11-05 DIAGNOSIS — Z5112 Encounter for antineoplastic immunotherapy: Secondary | ICD-10-CM | POA: Insufficient documentation

## 2020-11-05 DIAGNOSIS — D6481 Anemia due to antineoplastic chemotherapy: Secondary | ICD-10-CM | POA: Insufficient documentation

## 2020-11-05 DIAGNOSIS — Z79899 Other long term (current) drug therapy: Secondary | ICD-10-CM | POA: Diagnosis not present

## 2020-11-05 DIAGNOSIS — C562 Malignant neoplasm of left ovary: Secondary | ICD-10-CM | POA: Insufficient documentation

## 2020-11-05 DIAGNOSIS — E876 Hypokalemia: Secondary | ICD-10-CM | POA: Insufficient documentation

## 2020-11-05 DIAGNOSIS — R1900 Intra-abdominal and pelvic swelling, mass and lump, unspecified site: Secondary | ICD-10-CM

## 2020-11-05 LAB — CBC WITH DIFFERENTIAL (CANCER CENTER ONLY)
Abs Immature Granulocytes: 0.01 10*3/uL (ref 0.00–0.07)
Basophils Absolute: 0 10*3/uL (ref 0.0–0.1)
Basophils Relative: 0 %
Eosinophils Absolute: 0.1 10*3/uL (ref 0.0–0.5)
Eosinophils Relative: 2 %
HCT: 27.9 % — ABNORMAL LOW (ref 36.0–46.0)
Hemoglobin: 9 g/dL — ABNORMAL LOW (ref 12.0–15.0)
Immature Granulocytes: 0 %
Lymphocytes Relative: 43 %
Lymphs Abs: 1.9 10*3/uL (ref 0.7–4.0)
MCH: 27.7 pg (ref 26.0–34.0)
MCHC: 32.3 g/dL (ref 30.0–36.0)
MCV: 85.8 fL (ref 80.0–100.0)
Monocytes Absolute: 0.4 10*3/uL (ref 0.1–1.0)
Monocytes Relative: 10 %
Neutro Abs: 2.1 10*3/uL (ref 1.7–7.7)
Neutrophils Relative %: 45 %
Platelet Count: 545 10*3/uL — ABNORMAL HIGH (ref 150–400)
RBC: 3.25 MIL/uL — ABNORMAL LOW (ref 3.87–5.11)
RDW: 13.4 % (ref 11.5–15.5)
WBC Count: 4.6 10*3/uL (ref 4.0–10.5)
nRBC: 0 % (ref 0.0–0.2)

## 2020-11-05 LAB — CMP (CANCER CENTER ONLY)
ALT: 20 U/L (ref 0–44)
AST: 19 U/L (ref 15–41)
Albumin: 3.8 g/dL (ref 3.5–5.0)
Alkaline Phosphatase: 94 U/L (ref 38–126)
Anion gap: 13 (ref 5–15)
BUN: 10 mg/dL (ref 6–20)
CO2: 25 mmol/L (ref 22–32)
Calcium: 9.2 mg/dL (ref 8.9–10.3)
Chloride: 107 mmol/L (ref 98–111)
Creatinine: 0.73 mg/dL (ref 0.44–1.00)
GFR, Estimated: >60 mL/min (ref >60)
Glucose, Bld: 100 mg/dL — ABNORMAL HIGH (ref 70–99)
Potassium: 3.6 mmol/L (ref 3.5–5.1)
Sodium: 145 mmol/L (ref 135–145)
Total Bilirubin: <0.2 mg/dL — ABNORMAL LOW (ref 0.3–1.2)
Total Protein: 7.1 g/dL (ref 6.5–8.1)

## 2020-11-05 LAB — TOTAL PROTEIN, URINE DIPSTICK: Protein, ur: NEGATIVE mg/dL

## 2020-11-05 MED ORDER — SODIUM CHLORIDE 0.9 % IV SOLN
15.0000 mg/kg | Freq: Once | INTRAVENOUS | Status: AC
  Administered 2020-11-05: 1400 mg via INTRAVENOUS
  Filled 2020-11-05: qty 48

## 2020-11-05 MED ORDER — PROCHLORPERAZINE MALEATE 10 MG PO TABS: 10.0000 mg | ORAL_TABLET | Freq: Once | ORAL | Status: DC

## 2020-11-05 MED ORDER — HEPARIN SOD (PORK) LOCK FLUSH 100 UNIT/ML IV SOLN
500.0000 [IU] | Freq: Once | INTRAVENOUS | Status: DC | PRN
  Filled 2020-11-05: qty 5

## 2020-11-05 MED ORDER — PROCHLORPERAZINE MALEATE 10 MG PO TABS
ORAL_TABLET | ORAL | Status: AC
  Filled 2020-11-05: qty 1

## 2020-11-05 MED ORDER — SODIUM CHLORIDE 0.9% FLUSH
10.0000 mL | INTRAVENOUS | Status: DC | PRN
  Filled 2020-11-05: qty 10

## 2020-11-05 MED ORDER — SODIUM CHLORIDE 0.9 % IV SOLN
Freq: Once | INTRAVENOUS | Status: AC
Start: 2020-11-05 — End: 2020-11-05
  Filled 2020-11-05: qty 250

## 2020-11-05 MED ORDER — SODIUM CHLORIDE 0.9% FLUSH
10.0000 mL | Freq: Once | INTRAVENOUS | Status: AC
  Administered 2020-11-05: 10 mL
  Filled 2020-11-05: qty 10

## 2020-11-05 MED ORDER — SODIUM CHLORIDE 0.9 % IV SOLN: 800.0000 mg/m2 | Freq: Once | INTRAVENOUS | Status: DC

## 2020-11-05 NOTE — Patient Instructions (Signed)
[facility] Discharge Instructions for Patients Receiving Chemotherapy  Today you received the following chemotherapy agents avastin   To help prevent nausea and vomiting after your treatment, we encourage you to take your nausea medication as directed by provider    If you develop nausea and vomiting that is not controlled by your nausea medication, call the clinic.   BELOW ARE SYMPTOMS THAT SHOULD BE REPORTED IMMEDIATELY:  *FEVER GREATER THAN 100.5 F  *CHILLS WITH OR WITHOUT FEVER  NAUSEA AND VOMITING THAT IS NOT CONTROLLED WITH YOUR NAUSEA MEDICATION  *UNUSUAL SHORTNESS OF BREATH  *UNUSUAL BRUISING OR BLEEDING  TENDERNESS IN MOUTH AND THROAT WITH OR WITHOUT PRESENCE OF ULCERS  *URINARY PROBLEMS  *BOWEL PROBLEMS  UNUSUAL RASH Items with * indicate a potential emergency and should be followed up as soon as possible.  Feel free to call the clinic should you have any questions or concerns. The clinic phone number is [phone].  Please show the [facility] at check-in to the Emergency Department and triage nurse.

## 2020-11-05 NOTE — Patient Instructions (Signed)
Implanted Port Insertion, Care After This sheet gives you information about how to care for yourself after your procedure. Your health care provider may also give you more specific instructions. If you have problems or questions, contact your health care provider. What can I expect after the procedure? After the procedure, it is common to have:  Discomfort at the port insertion site.  Bruising on the skin over the port. This should improve over 3-4 days. Follow these instructions at home: Port care  After your port is placed, you will get a manufacturer's information card. The card has information about your port. Keep this card with you at all times.  Take care of the port as told by your health care provider. Ask your health care provider if you or a family member can get training for taking care of the port at home. A home health care nurse may also take care of the port.  Make sure to remember what type of port you have. Incision care  Follow instructions from your health care provider about how to take care of your port insertion site. Make sure you: ? Wash your hands with soap and water before and after you change your bandage (dressing). If soap and water are not available, use hand sanitizer. ? Change your dressing as told by your health care provider. ? Leave stitches (sutures), skin glue, or adhesive strips in place. These skin closures may need to stay in place for 2 weeks or longer. If adhesive strip edges start to loosen and curl up, you may trim the loose edges. Do not remove adhesive strips completely unless your health care provider tells you to do that.  Check your port insertion site every day for signs of infection. Check for: ? Redness, swelling, or pain. ? Fluid or blood. ? Warmth. ? Pus or a bad smell.      Activity  Return to your normal activities as told by your health care provider. Ask your health care provider what activities are safe for you.  Do not  lift anything that is heavier than 10 lb (4.5 kg), or the limit that you are told, until your health care provider says that it is safe. General instructions  Take over-the-counter and prescription medicines only as told by your health care provider.  Do not take baths, swim, or use a hot tub until your health care provider approves. Ask your health care provider if you may take showers. You may only be allowed to take sponge baths.  Do not drive for 24 hours if you were given a sedative during your procedure.  Wear a medical alert bracelet in case of an emergency. This will tell any health care providers that you have a port.  Keep all follow-up visits as told by your health care provider. This is important. Contact a health care provider if:  You cannot flush your port with saline as directed, or you cannot draw blood from the port.  You have a fever or chills.  You have redness, swelling, or pain around your port insertion site.  You have fluid or blood coming from your port insertion site.  Your port insertion site feels warm to the touch.  You have pus or a bad smell coming from the port insertion site. Get help right away if:  You have chest pain or shortness of breath.  You have bleeding from your port that you cannot control. Summary  Take care of the port as told by your   health care provider. Keep the manufacturer's information card with you at all times.  Change your dressing as told by your health care provider.  Contact a health care provider if you have a fever or chills or if you have redness, swelling, or pain around your port insertion site.  Keep all follow-up visits as told by your health care provider. This information is not intended to replace advice given to you by your health care provider. Make sure you discuss any questions you have with your health care provider. Document Revised: 02/16/2018 Document Reviewed: 02/16/2018 Elsevier Patient Education   2021 [employer]  

## 2020-11-06 LAB — CA 125: Cancer Antigen (CA) 125: 18.9 U/mL (ref 0.0–38.1)

## 2020-11-07 ENCOUNTER — Telehealth: Payer: Self-pay | Admitting: *Deleted

## 2020-11-07 NOTE — Progress Notes (Signed)
..  The following Medication: [employer] is approved for drug replacement program by [place]. The enrollment period is from 11/06/2020 to 11/06/2021.  Reason for Assistance: Self Pay. ID: [id number] First DOS:11/05/2020.

## 2020-11-08 ENCOUNTER — Other Ambulatory Visit (HOSPITAL_COMMUNITY): Payer: Self-pay

## 2020-11-08 ENCOUNTER — Other Ambulatory Visit: Payer: Self-pay | Admitting: Hematology and Oncology

## 2020-11-08 MED FILL — Lidocaine-Prilocaine Cream 2.5-2.5%: CUTANEOUS | 14 days supply | Qty: 30 | Fill #0 | Status: AC

## 2020-11-09 ENCOUNTER — Other Ambulatory Visit: Payer: Self-pay | Admitting: Hematology and Oncology

## 2020-11-09 ENCOUNTER — Other Ambulatory Visit (HOSPITAL_COMMUNITY): Payer: Self-pay

## 2020-11-09 MED ORDER — MORPHINE SULFATE 15 MG PO TABS
15.0000 mg | ORAL_TABLET | ORAL | 0 refills | Status: DC | PRN
  Filled 2020-11-09: qty 60, 10d supply, fill #0

## 2020-11-12 ENCOUNTER — Inpatient Hospital Stay: Payer: Medicaid Other

## 2020-11-12 ENCOUNTER — Inpatient Hospital Stay (HOSPITAL_BASED_OUTPATIENT_CLINIC_OR_DEPARTMENT_OTHER): Payer: Medicaid Other | Admitting: Hematology and Oncology

## 2020-11-12 ENCOUNTER — Encounter: Payer: Self-pay | Admitting: Hematology and Oncology

## 2020-11-12 ENCOUNTER — Other Ambulatory Visit: Payer: Self-pay

## 2020-11-12 DIAGNOSIS — C562 Malignant neoplasm of left ovary: Secondary | ICD-10-CM

## 2020-11-12 DIAGNOSIS — T451X5A Adverse effect of antineoplastic and immunosuppressive drugs, initial encounter: Secondary | ICD-10-CM

## 2020-11-12 DIAGNOSIS — E876 Hypokalemia: Secondary | ICD-10-CM

## 2020-11-12 DIAGNOSIS — Z5112 Encounter for antineoplastic immunotherapy: Secondary | ICD-10-CM | POA: Diagnosis not present

## 2020-11-12 DIAGNOSIS — D6481 Anemia due to antineoplastic chemotherapy: Secondary | ICD-10-CM

## 2020-11-12 LAB — CMP (CANCER CENTER ONLY)
ALT: 17 U/L (ref 0–44)
AST: 17 U/L (ref 15–41)
Albumin: 3.3 g/dL — ABNORMAL LOW (ref 3.5–5.0)
Alkaline Phosphatase: 88 U/L (ref 38–126)
Anion gap: 11 (ref 5–15)
BUN: 9 mg/dL (ref 6–20)
CO2: 24 mmol/L (ref 22–32)
Calcium: 9 mg/dL (ref 8.9–10.3)
Chloride: 106 mmol/L (ref 98–111)
Creatinine: 0.67 mg/dL (ref 0.44–1.00)
GFR, Estimated: >60 mL/min (ref >60)
Glucose, Bld: 132 mg/dL — ABNORMAL HIGH (ref 70–99)
Potassium: 3.3 mmol/L — ABNORMAL LOW (ref 3.5–5.1)
Sodium: 141 mmol/L (ref 135–145)
Total Bilirubin: 0.2 mg/dL — ABNORMAL LOW (ref 0.3–1.2)
Total Protein: 6.3 g/dL — ABNORMAL LOW (ref 6.5–8.1)

## 2020-11-12 LAB — CBC WITH DIFFERENTIAL (CANCER CENTER ONLY)
Abs Immature Granulocytes: 0.01 10*3/uL (ref 0.00–0.07)
Basophils Absolute: 0 10*3/uL (ref 0.0–0.1)
Basophils Relative: 0 %
Eosinophils Absolute: 0.2 10*3/uL (ref 0.0–0.5)
Eosinophils Relative: 4 %
HCT: 29.3 % — ABNORMAL LOW (ref 36.0–46.0)
Hemoglobin: 9.2 g/dL — ABNORMAL LOW (ref 12.0–15.0)
Immature Granulocytes: 0 %
Lymphocytes Relative: 40 %
Lymphs Abs: 2 10*3/uL (ref 0.7–4.0)
MCH: 27.4 pg (ref 26.0–34.0)
MCHC: 31.4 g/dL (ref 30.0–36.0)
MCV: 87.2 fL (ref 80.0–100.0)
Monocytes Absolute: 0.5 10*3/uL (ref 0.1–1.0)
Monocytes Relative: 10 %
Neutro Abs: 2.3 10*3/uL (ref 1.7–7.7)
Neutrophils Relative %: 46 %
Platelet Count: 495 10*3/uL — ABNORMAL HIGH (ref 150–400)
RBC: 3.36 MIL/uL — ABNORMAL LOW (ref 3.87–5.11)
RDW: 14 % (ref 11.5–15.5)
WBC Count: 4.9 10*3/uL (ref 4.0–10.5)
nRBC: 0 % (ref 0.0–0.2)

## 2020-11-12 MED ORDER — SODIUM CHLORIDE 0.9 % IV SOLN
Freq: Once | INTRAVENOUS | Status: AC
  Filled 2020-11-12: qty 250

## 2020-11-12 MED ORDER — HEPARIN SOD (PORK) LOCK FLUSH 100 UNIT/ML IV SOLN
500.0000 [IU] | Freq: Once | INTRAVENOUS | Status: AC | PRN
  Administered 2020-11-12: 500 [IU]
  Filled 2020-11-12: qty 5

## 2020-11-12 MED ORDER — SODIUM CHLORIDE 0.9% FLUSH
10.0000 mL | INTRAVENOUS | Status: DC | PRN
  Administered 2020-11-12: 10 mL
  Filled 2020-11-12: qty 10

## 2020-11-12 MED ORDER — SODIUM CHLORIDE 0.9 % IV SOLN
800.0000 mg/m2 | Freq: Once | INTRAVENOUS | Status: AC
  Administered 2020-11-12: 1634 mg via INTRAVENOUS
  Filled 2020-11-12: qty 42.98

## 2020-11-12 MED ORDER — PROCHLORPERAZINE MALEATE 10 MG PO TABS
ORAL_TABLET | ORAL | Status: AC
  Filled 2020-11-12: qty 1

## 2020-11-12 MED ORDER — SODIUM CHLORIDE 0.9% FLUSH
10.0000 mL | Freq: Once | INTRAVENOUS | Status: AC
  Administered 2020-11-12: 10 mL
  Filled 2020-11-12: qty 10

## 2020-11-12 MED ORDER — PROCHLORPERAZINE MALEATE 10 MG PO TABS
10.0000 mg | ORAL_TABLET | Freq: Once | ORAL | Status: AC
  Administered 2020-11-12: 10 mg via ORAL

## 2020-11-12 NOTE — Progress Notes (Signed)
[facility] OFFICE PROGRESS NOTE  Patient Care Team: Heath Lark, MD as PCP - General (Hematology and Oncology)  ASSESSMENT & PLAN:  Malignant neoplasm of left ovary [facility] Her examination is benign Her recent tumor markers show positive response to therapy Despite her lack of insurance, she is willing to proceed with treatment I recommend resumption of gemcitabine I plan to repeat imaging study next month for further follow-up  Anemia due to antineoplastic chemotherapy This is multifactorial, likely anemia of chronic disease She is not symptomatic Observe for now  Hypokalemia She has mild intermittent hypokalemia, could be related to her dietary intake Observe closely for now She is not symptomatic   No orders of the defined types were placed in this encounter.   All questions were answered. The patient knows to call the clinic with any problems, questions or concerns. The total time spent in the appointment was 20 minutes encounter with patients including review of chart and various tests results, discussions about plan of care and coordination of care plan   Heath Lark, MD 11/12/2020 1:39 PM  INTERVAL HISTORY: Please see below for problem oriented charting. She returns for treatment and follow-up today She was late for her appointment because she was not sure whether she should show up for her appointment She have lost her insurance recently She denies abdominal pain or changes in bowel habits The patient denies any recent signs or symptoms of bleeding such as spontaneous epistaxis, hematuria or hematochezia.  SUMMARY OF ONCOLOGIC HISTORY: Oncology History Overview Note  Clear cell features Negative genetics   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[place])  07/02/2019 Imaging   Ct scan of abdomen and pelvis 1. There is a 16 cm complex cystic mass in the pelvis containing thickened septations located near midline, abutting the superior aspect of the uterus. I  suspect this mass is adnexal/ovarian in origin rather than uterine in origin. Specifically, I suspect the mass likely arises from the left ovary/adnexa and is most consistent with a neoplasm. Malignancy is certainly not excluded on this study. Recommend a pelvic ultrasound for further evaluation. 2. The rounded more solid-appearing structure in the right side of the pelvis is probably the right ovary. Recommend attention on ultrasound. 3. The small amount of fluid in the pelvis is likely reactive to the complex cystic mass likely rising from the left ovary/adnexa. 4. No cause for blood in vomit or black stools identified. No convincing evidence of a perforated or inflamed gastric or duodenal ulcer. 5. Hepatic steatosis.   07/02/2019 Imaging   MRI pelvis 1. 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 There are 2 enhancing nodules within the central upper abdomen raising the possibility of peritoneal carcinomatosis. Additionally, there is a small amount fluid within the pelvis. Malignant ascites not excluded. 2. Fibroid uterus. 3. Hepatic steatosis.   07/02/2019 Imaging   US pelvis 1. There is a large mass in the pelvis also seen on CT imaging. A separate left ovary is not visualized. 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 The mass is suspicious for malignancy. Recommend gynecologic consultation. 2. The right ovary demonstrates an irregular ill-defined hypoechoic hypervascular region centrally. The findings are nonspecific in the right ovary. However, given the apparent left ovarian neoplasm suspicious for malignancy, recommend an MRI to further assess the right ovary. 3. Uterine fibroid measuring 3.6 cm.  07/21/2019 Pathology Results   FINAL MICROSCOPIC DIAGNOSIS: A. 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 - See oncology  table. B. OMENTUM, RESECTION: - Omental lymph node with metastatic carcinoma (1/1). - Omental adipose tissue with foci of inflammation and reactive mesothelial changes. C. FALLOPIAN TUBE, LEFT, RESECTION: - Fallopian tube with focal, mild inflammation and fibrosis. - No tumor identified. D. UTERUS, CERVIX, RIGHT TUBE AND OVARY: - Cervix Nabothian cyst and squamous metaplasia. - Endometrium Proliferative. No hyperplasia or carcinoma. - Myometrium Leiomyomata. No malignancy. -Right ovary Poorly differentiated carcinoma, 4.8 cm. See oncology table and comment. -Right Fallopian tube Benign paratubal cyst. No endometriosis or malignancy. ONCOLOGY TABLE: OVARY or FALLOPIAN TUBE or PRIMARY PERITONEUM: Procedure: Bilateral f-oophorectomy, hysterectomy and omentectomy. Specimen Integrity: Intact Tumor Site: Left ovary and right ovary. See comment. Ovarian Surface Involvement: Not identified. Fallopian Tube Surface Involvement: Not identified. Tumor Size: Left ovary: 18.6 x 16.0 x 7.2 cm. Right ovary: 4.8 x 3.2 x 3.2 cm. Histologic Type: Clear cell carcinoma. See comment. Histologic Grade: High-grade. Other Tissue/ Organ Involvement: Omental lymph node with metastatic carcinoma. Peritoneal/Ascitic Fluid: Pending. Treatment Effect: No known presurgical therapy. Pathologic Stage Classification (pTNM, AJCC 8th Edition): pT3a, pN1a. Representative Tumor Block: A2, A3, A4, A5 A6, A7 and A8. Comment(s): The left ovarian tumor is 18.6 cm in greatest dimension and has features of clear cell carcinoma. The right ovarian tumor is 4.8 cm in greatest dimension and is poorly differentiated with focal features consistent with clear cell carcinoma. The pattern of involvement suggests that the right ovarian tumor is likely metastatic from the larger left ovarian clear cell carcinoma. Sections of the omentum show a lymph node with metastatic carcinoma. Sections of the remainder of the omentum do not show  involvement by carcinoma.   07/21/2019 Surgery   Procedure(s) Performed: Exploratory laparotomy with radical tumor debulking including total hysterectomy, bilateral salpingo-oophorectomy, infra-gastric omentectomy, and washings.   Surgeon: Jeral Pinch, MD    Operative Findings: On EUA, mobile uterus and ovarian mass, situated out of the pelvis, spanning 6-8 cm above the umbilicus.  On intra-abdominal entry, inflamed omentum adherent to much of the 16 cm left ovarian mass, adhesions easily lysed bluntly.  No nodularity of the ovarian mass however on delivery of the mass through the midline incision, there was Intra-Op rupture of one of the smaller cystic components with drainage of clear brown-tinged fluid.  Grossly normal-appearing left fallopian tube as well as right tube.  Right ovary mildly enlarged measuring approximately 4 cm and firm/nodular, suspicious for tumor involvement.  Uterus 8-10 cm with 4 cm anterior mid body fibroid.  Some very small volume miliary disease along right pelvic sidewall, removed with uterine specimen.  Evidence of small diverticular disease.  Small bowel normal appearing although multiple loops of bowel adherent to each other, especially by mesentery, and an inflammatory manner.  An approximately 2 x 2 centimeter nodule suspicious for tumor implant was found in the omentum just inferior to the greater curvature of the stomach.  Additional small (less than 5 mm) implants noted on the stomach and posterior aspect of the left lobe of the liver.  Otherwise the liver surface and diaphragm were smooth.   08/03/2019 Cancer Staging   Staging form: Ovary, Fallopian Tube, and Primary Peritoneal Carcinoma, AJCC 8th Edition - Pathologic: Stage IIIA2 (pT3a, pN1, cM0) - Signed by Heath Lark, MD on 08/03/2019   08/12/2019 Procedure   Placement of single lumen port a cath via right internal jugular vein. The  catheter tip lies at the cavo-atrial junction. A power injectable port a  cath was placed and is ready for immediate use.   08/22/2019 -  Chemotherapy   The patient had carboplatin and taxol for chemotherapy treatment.     09/23/2019 Genetic Testing   Negative genetic testing:  No pathogenic variants detected on the [facility] TumorNext-HRD+CancerNext paired germline and somatic genetic test. The report date is 09/23/2019.  The TumorNext-HRD+CancerNext test offered by [facility] includes paired germline and tumor analyses of 11 genes associated with homologous recombination repair (ATM, BARD1, BRIP1, CHEK2, MRE11A, NBN, PALB2, RAD51C, RAD51D, BRCA1, BRCA2) plus germline analyses of 26 additional genes associated with hereditary cancer (APC, AXIN2, BMPR1A, CDH1, CDK4, CDKN2A, DICER1, HOXB13, EPCAM, GREM1, MLH1, MSH2, MSH3, MSH6, MUTYH, NF1, NTHL1, PMS2, POLD1, POLE, PTEN, RECQL, SMAD4, SMARCA4, STK11, and TP53).   11/14/2019 Tumor Marker   Patient's tumor was tested for the following markers: CA-125 Results of the tumor marker test revealed 10.2.   12/05/2019 Tumor Marker   Patient's tumor was tested for the following markers: CA-125 Results of the tumor marker test revealed 9.9   01/05/2020 Imaging   No specific findings of active malignancy. Interval resection of the pelvic mass. Subtle nodularity along the right side of the vaginal cuff merits surveillance.     01/05/2020 Tumor Marker   Patient's tumor was tested for the following markers: CA-125 Results of the tumor marker test revealed 9.7   02/17/2020 Tumor Marker   Patient's tumor was tested for the following markers: CA-125 Results of the tumor marker test revealed 9.6   04/05/2020 Imaging   1. There is some minimal stranding along the mesentery and omentum without overt omental caking or well-defined nodularity. Faint bandlike density along the anterior peritoneal reflection in the pelvis, slightly more notable than on prior. Overall the appearance is not considered specific for recurrence but given the slight increase in  prominence from 01/05/2020, would encourage careful surveillance by tumor markers and imaging. 2. Mild lower lumbar degenerative facet arthropathy.   04/05/2020 Tumor Marker   Patient's tumor was tested for the following markers: CA-125 Results of the tumor marker test revealed 11   05/29/2020 Tumor Marker   Patient's tumor was tested for the following markers: CA-125. Results of the tumor marker test revealed 17.   07/04/2020 Tumor Marker   Patient's tumor was tested for the following markers: CA-125. Results of the tumor marker test revealed 20.1   07/16/2020 Imaging   Stable mild stranding throughout mesenteric and omental fat, without evidence of discrete masses or ascites. No evidence of new or progressive disease.   08/29/2020 Tumor Marker   Patient's tumor was tested for the following markers: CA-125 Results of the tumor marker test revealed 32.7.   09/07/2020 Imaging   1. Interval progression of peritoneal disease within the abdomen and pelvis. Tumor predominantly involves the serosal surface of the large and small bowel loops. No signs of bowel obstruction identified at this time. 2. No evidence for metastatic disease to the chest.   09/19/2020 Imaging   1. No acute findings identified within the abdomen or pelvis. No evidence for bowel obstruction. 2. Similar appearance of soft tissue thickening along the peritoneal reflections within the abdomen and pelvis compatible with known peritoneal disease. Recently characterized increased soft tissue along the surface of the jejunal bowel loops and tethering of pelvic small bowel loops is difficult to quantify (and compared with previous exam) due to lack of IV contrast material.     09/20/2020 -  Chemotherapy    Patient is on Treatment Plan: OVARIAN RECURRENT 3RD LINE CARBOPLATIN D1 / GEMCITABINE D1,8 (4/800) Q21D  Carboplatin was discontinued after 10/15/2020 due to allergic reaction      10/01/2020 Tumor Marker   Patient's tumor  was tested for the following markers: CA-125. Results of the tumor marker test revealed 37.2.   10/15/2020 Tumor Marker   Patient's tumor was tested for the following markers: CA-125. Results of the tumor marker test revealed 48   10/22/2020 Tumor Marker   Patient's tumor was tested for the following markers: CA-125 Results of the tumor marker test revealed 28.5   11/05/2020 Tumor Marker   Patient's tumor was tested for the following markers: CA-125 Results of the tumor marker test revealed 18.9     REVIEW OF SYSTEMS:   Constitutional: Denies fevers, chills or abnormal weight loss Eyes: Denies blurriness of vision Ears, nose, mouth, throat, and face: Denies mucositis or sore throat Respiratory: Denies cough, dyspnea or wheezes Cardiovascular: Denies palpitation, chest discomfort or lower extremity swelling Gastrointestinal:  Denies nausea, heartburn or change in bowel habits Skin: Denies abnormal skin rashes Lymphatics: Denies new lymphadenopathy or easy bruising Neurological:Denies numbness, tingling or new weaknesses Behavioral/Psych: Mood is stable, no new changes  All other systems were reviewed with the patient and are negative.  I have reviewed the past medical history, past surgical history, social history and family history with the patient and they are unchanged from previous note.  ALLERGIES:  is allergic to carboplatin.  MEDICATIONS:  Current Outpatient Medications  Medication Sig Dispense Refill  . acetaminophen (TYLENOL) 500 MG tablet Take 1,000 mg by mouth every 6 (six) hours as needed for moderate pain.     Marland Kitchen lidocaine-prilocaine (EMLA) cream APPLY TO THE AFFECTED AREA AS DIRECTED TO ACCESS PORT 30 g 11  . metFORMIN (GLUCOPHAGE) 500 MG tablet TAKE 1 TABLET BY MOUTH TWICE DAILY WITH A MEAL 60 tablet 1  . morphine (MSIR) 15 MG tablet Take 1 tablet (15 mg total) by mouth every 4 (four) hours as needed. 60 tablet 0  . Multiple Vitamin (MULTIVITAMIN WITH MINERALS) TABS  tablet Take 1 tablet by mouth daily.    . ondansetron (ZOFRAN) 8 MG tablet TAKE 1 TABLET BY MOUTH EVERY 8 HOURS AS NEEDED FOR NAUSEA OR VOMITING 60 tablet 1  . pantoprazole (PROTONIX) 40 MG tablet TAKE 1 TABLET BY MOUTH DAILY 30 tablet 11  . prochlorperazine (COMPAZINE) 10 MG tablet Take 10 mg by mouth every 6 (six) hours as needed.     No current facility-administered medications for this visit.   Facility-Administered Medications Ordered in Other Visits  Medication Dose Route Frequency Provider Last Rate Last Admin  . gemcitabine (GEMZAR) 1,634 mg in sodium chloride 0.9 % 250 mL chemo infusion  800 mg/m2 (Treatment Plan Recorded) Intravenous Once Alvy Bimler, Khori Rosevear, MD      . heparin lock flush 100 unit/mL  500 Units Intracatheter Once PRN Alvy Bimler, Najia Hurlbutt, MD      . prochlorperazine (COMPAZINE) tablet 10 mg  10 mg Oral Once Adrine Hayworth, MD      . sodium chloride flush (NS) 0.9 % injection 10 mL  10 mL Intracatheter PRN Alvy Bimler, Arlet Marter, MD        PHYSICAL EXAMINATION: ECOG PERFORMANCE STATUS: 1 - Symptomatic but completely ambulatory  Vitals:   11/12/20 1244  BP: 117/81  Pulse: 74  Resp: 18  Temp: 98.3 F (36.8 C)  SpO2: 100%   Filed Weights   11/12/20 1244  Weight:  205 lb 4.8 oz (93.1 kg)    GENERAL:alert, no distress and comfortable SKIN: skin color, texture, turgor are normal, no rashes or significant lesions EYES: normal, Conjunctiva are pink and non-injected, sclera clear OROPHARYNX:no exudate, no erythema and lips, buccal mucosa, and tongue normal  NECK: supple, thyroid normal size, non-tender, without nodularity LYMPH:  no palpable lymphadenopathy in the cervical, axillary or inguinal LUNGS: clear to auscultation and percussion with normal breathing effort HEART: regular rate & rhythm and no murmurs and no lower extremity edema ABDOMEN:abdomen soft, non-tender and normal bowel sounds Musculoskeletal:no cyanosis of digits and no clubbing  NEURO: alert & oriented x 3 with fluent  speech, no focal motor/sensory deficits  LABORATORY DATA:  I have reviewed the data as listed    Component Value Date/Time   NA 141 11/12/2020 1228   K 3.3 (L) 11/12/2020 1228   CL 106 11/12/2020 1228   CO2 24 11/12/2020 1228   GLUCOSE 132 (H) 11/12/2020 1228   BUN 9 11/12/2020 1228   CREATININE 0.67 11/12/2020 1228   CALCIUM 9.0 11/12/2020 1228   PROT 6.3 (L) 11/12/2020 1228   ALBUMIN 3.3 (L) 11/12/2020 1228   AST 17 11/12/2020 1228   ALT 17 11/12/2020 1228   ALKPHOS 88 11/12/2020 1228   BILITOT 0.2 (L) 11/12/2020 1228   GFRNONAA >60 11/12/2020 1228   GFRAA >60 04/05/2020 1008    No results found for: SPEP, UPEP  Lab Results  Component Value Date   WBC 4.9 11/12/2020   NEUTROABS 2.3 11/12/2020   HGB 9.2 (L) 11/12/2020   HCT 29.3 (L) 11/12/2020   MCV 87.2 11/12/2020   PLT 495 (H) 11/12/2020      Chemistry      Component Value Date/Time   NA 141 11/12/2020 1228   K 3.3 (L) 11/12/2020 1228   CL 106 11/12/2020 1228   CO2 24 11/12/2020 1228   BUN 9 11/12/2020 1228   CREATININE 0.67 11/12/2020 1228      Component Value Date/Time   CALCIUM 9.0 11/12/2020 1228   ALKPHOS 88 11/12/2020 1228   AST 17 11/12/2020 1228   ALT 17 11/12/2020 1228   BILITOT 0.2 (L) 11/12/2020 1228

## 2020-11-12 NOTE — Patient Instructions (Signed)
Implanted Port Insertion, Care After This sheet gives you information about how to care for yourself after your procedure. Your health care provider may also give you more specific instructions. If you have problems or questions, contact your health care provider. What can I expect after the procedure? After the procedure, it is common to have:  Discomfort at the port insertion site.  Bruising on the skin over the port. This should improve over 3-4 days. Follow these instructions at home: Port care  After your port is placed, you will get a manufacturer's information card. The card has information about your port. Keep this card with you at all times.  Take care of the port as told by your health care provider. Ask your health care provider if you or a family member can get training for taking care of the port at home. A home health care nurse may also take care of the port.  Make sure to remember what type of port you have. Incision care  Follow instructions from your health care provider about how to take care of your port insertion site. Make sure you: ? Wash your hands with soap and water before and after you change your bandage (dressing). If soap and water are not available, use hand sanitizer. ? Change your dressing as told by your health care provider. ? Leave stitches (sutures), skin glue, or adhesive strips in place. These skin closures may need to stay in place for 2 weeks or longer. If adhesive strip edges start to loosen and curl up, you may trim the loose edges. Do not remove adhesive strips completely unless your health care provider tells you to do that.  Check your port insertion site every day for signs of infection. Check for: ? Redness, swelling, or pain. ? Fluid or blood. ? Warmth. ? Pus or a bad smell.      Activity  Return to your normal activities as told by your health care provider. Ask your health care provider what activities are safe for you.  Do not  lift anything that is heavier than 10 lb (4.5 kg), or the limit that you are told, until your health care provider says that it is safe. General instructions  Take over-the-counter and prescription medicines only as told by your health care provider.  Do not take baths, swim, or use a hot tub until your health care provider approves. Ask your health care provider if you may take showers. You may only be allowed to take sponge baths.  Do not drive for 24 hours if you were given a sedative during your procedure.  Wear a medical alert bracelet in case of an emergency. This will tell any health care providers that you have a port.  Keep all follow-up visits as told by your health care provider. This is important. Contact a health care provider if:  You cannot flush your port with saline as directed, or you cannot draw blood from the port.  You have a fever or chills.  You have redness, swelling, or pain around your port insertion site.  You have fluid or blood coming from your port insertion site.  Your port insertion site feels warm to the touch.  You have pus or a bad smell coming from the port insertion site. Get help right away if:  You have chest pain or shortness of breath.  You have bleeding from your port that you cannot control. Summary  Take care of the port as told by your   health care provider. Keep the manufacturer's information card with you at all times.  Change your dressing as told by your health care provider.  Contact a health care provider if you have a fever or chills or if you have redness, swelling, or pain around your port insertion site.  Keep all follow-up visits as told by your health care provider. This information is not intended to replace advice given to you by your health care provider. Make sure you discuss any questions you have with your health care provider. Document Revised: 02/16/2018 Document Reviewed: 02/16/2018 Elsevier Patient Education   2021 [employer]  

## 2020-11-12 NOTE — Assessment & Plan Note (Signed)
This is multifactorial, likely anemia of chronic disease She is not symptomatic Observe for now

## 2020-11-12 NOTE — Assessment & Plan Note (Signed)
She has mild intermittent hypokalemia, could be related to her dietary intake Observe closely for now She is not symptomatic

## 2020-11-12 NOTE — Assessment & Plan Note (Signed)
Her examination is benign Her recent tumor markers show positive response to therapy Despite her lack of insurance, she is willing to proceed with treatment I recommend resumption of gemcitabine I plan to repeat imaging study next month for further follow-up

## 2020-11-12 NOTE — Patient Instructions (Signed)
Gemcitabine injection What is this medicine? GEMCITABINE (jem SYE ta been) is a chemotherapy drug. This medicine is used to treat many types of cancer like breast cancer, lung cancer, pancreatic cancer, and ovarian cancer. This medicine may be used for other purposes; ask your health care provider or pharmacist if you have questions. COMMON BRAND NAME(S): Gemzar, Infugem What should I tell my health care provider before I take this medicine? They need to know if you have any of these conditions:  blood disorders  infection  kidney disease  liver disease  lung or breathing disease, like asthma  recent or ongoing radiation therapy  an unusual or allergic reaction to gemcitabine, other chemotherapy, other medicines, foods, dyes, or preservatives  pregnant or trying to get pregnant  breast-feeding How should I use this medicine? This drug is given as an infusion into a vein. It is administered in a hospital or clinic by a specially trained health care professional. Talk to your pediatrician regarding the use of this medicine in children. Special care may be needed. Overdosage: If you think you have taken too much of this medicine contact a poison control center or emergency room at once. NOTE: This medicine is only for you. Do not share this medicine with others. What if I miss a dose? It is important not to miss your dose. Call your doctor or health care professional if you are unable to keep an appointment. What may interact with this medicine?  medicines to increase blood counts like filgrastim, pegfilgrastim, sargramostim  some other chemotherapy drugs like cisplatin  vaccines Talk to your doctor or health care professional before taking any of these medicines:  acetaminophen  aspirin  ibuprofen  ketoprofen  naproxen This list may not describe all possible interactions. Give your health care provider a list of all the medicines, herbs, non-prescription drugs, or  dietary supplements you use. Also tell them if you smoke, drink alcohol, or use illegal drugs. Some items may interact with your medicine. What should I watch for while using this medicine? Visit your doctor for checks on your progress. This drug may make you feel generally unwell. This is not uncommon, as chemotherapy can affect healthy cells as well as cancer cells. Report any side effects. Continue your course of treatment even though you feel ill unless your doctor tells you to stop. In some cases, you may be given additional medicines to help with side effects. Follow all directions for their use. Call your doctor or health care professional for advice if you get a fever, chills or sore throat, or other symptoms of a cold or flu. Do not treat yourself. This drug decreases your body's ability to fight infections. Try to avoid being around people who are sick. This medicine may increase your risk to bruise or bleed. Call your doctor or health care professional if you notice any unusual bleeding. Be careful brushing and flossing your teeth or using a toothpick because you may get an infection or bleed more easily. If you have any dental work done, tell your dentist you are receiving this medicine. Avoid taking products that contain aspirin, acetaminophen, ibuprofen, naproxen, or ketoprofen unless instructed by your doctor. These medicines may hide a fever. Do not become pregnant while taking this medicine or for 6 months after stopping it. Women should inform their doctor if they wish to become pregnant or think they might be pregnant. Men should not father a child while taking this medicine and for 3 months after stopping it.   There is a potential for serious side effects to an unborn child. Talk to your health care professional or pharmacist for more information. Do not breast-feed an infant while taking this medicine or for at least 1 week after stopping it. Men should inform their doctors if they wish  to father a child. This medicine may lower sperm counts. Talk with your doctor or health care professional if you are concerned about your fertility. What side effects may I notice from receiving this medicine? Side effects that you should report to your doctor or health care professional as soon as possible:  allergic reactions like skin rash, itching or hives, swelling of the face, lips, or tongue  breathing problems  pain, redness, or irritation at site where injected  signs and symptoms of a dangerous change in heartbeat or heart rhythm like chest pain; dizziness; fast or irregular heartbeat; palpitations; feeling faint or lightheaded, falls; breathing problems  signs of decreased platelets or bleeding - bruising, pinpoint red spots on the skin, black, tarry stools, blood in the urine  signs of decreased red blood cells - unusually weak or tired, feeling faint or lightheaded, falls  signs of infection - fever or chills, cough, sore throat, pain or difficulty passing urine  signs and symptoms of kidney injury like trouble passing urine or change in the amount of urine  signs and symptoms of liver injury like dark yellow or brown urine; general ill feeling or flu-like symptoms; light-colored stools; loss of appetite; nausea; right upper belly pain; unusually weak or tired; yellowing of the eyes or skin  swelling of ankles, feet, hands Side effects that usually do not require medical attention (report to your doctor or health care professional if they continue or are bothersome):  constipation  diarrhea  hair loss  loss of appetite  nausea  rash  vomiting This list may not describe all possible side effects. Call your doctor for medical advice about side effects. You may report side effects to FDA at 1-800-FDA-1088. Where should I keep my medicine? This drug is given in a hospital or clinic and will not be stored at home. NOTE: This sheet is a summary. It may not cover all  possible information. If you have questions about this medicine, talk to your doctor, pharmacist, or health care provider.  2021 Elsevier/Gold Standard (2017-10-14 18:06:11)  

## 2020-11-19 ENCOUNTER — Inpatient Hospital Stay: Payer: Medicaid Other

## 2020-11-19 ENCOUNTER — Other Ambulatory Visit: Payer: Self-pay

## 2020-11-19 VITALS — BP 129/96 | HR 67 | Temp 98.1°F | Resp 16 | Wt 201.5 lb

## 2020-11-19 DIAGNOSIS — C562 Malignant neoplasm of left ovary: Secondary | ICD-10-CM

## 2020-11-19 DIAGNOSIS — Z5112 Encounter for antineoplastic immunotherapy: Secondary | ICD-10-CM | POA: Diagnosis not present

## 2020-11-19 LAB — CBC WITH DIFFERENTIAL (CANCER CENTER ONLY)
Abs Immature Granulocytes: 0.08 10*3/uL — ABNORMAL HIGH (ref 0.00–0.07)
Basophils Absolute: 0 10*3/uL (ref 0.0–0.1)
Basophils Relative: 1 %
Eosinophils Absolute: 0 10*3/uL (ref 0.0–0.5)
Eosinophils Relative: 1 %
HCT: 27.7 % — ABNORMAL LOW (ref 36.0–46.0)
Hemoglobin: 8.9 g/dL — ABNORMAL LOW (ref 12.0–15.0)
Immature Granulocytes: 2 %
Lymphocytes Relative: 51 %
Lymphs Abs: 1.9 10*3/uL (ref 0.7–4.0)
MCH: 27.3 pg (ref 26.0–34.0)
MCHC: 32.1 g/dL (ref 30.0–36.0)
MCV: 85 fL (ref 80.0–100.0)
Monocytes Absolute: 0.3 10*3/uL (ref 0.1–1.0)
Monocytes Relative: 9 %
Neutro Abs: 1.3 10*3/uL — ABNORMAL LOW (ref 1.7–7.7)
Neutrophils Relative %: 36 %
Platelet Count: 246 10*3/uL (ref 150–400)
RBC: 3.26 MIL/uL — ABNORMAL LOW (ref 3.87–5.11)
RDW: 13.5 % (ref 11.5–15.5)
WBC Count: 3.7 10*3/uL — ABNORMAL LOW (ref 4.0–10.5)
nRBC: 0 % (ref 0.0–0.2)

## 2020-11-19 LAB — CMP (CANCER CENTER ONLY)
ALT: 46 U/L — ABNORMAL HIGH (ref 0–44)
AST: 37 U/L (ref 15–41)
Albumin: 3.7 g/dL (ref 3.5–5.0)
Alkaline Phosphatase: 92 U/L (ref 38–126)
Anion gap: 11 (ref 5–15)
BUN: 9 mg/dL (ref 6–20)
CO2: 26 mmol/L (ref 22–32)
Calcium: 9 mg/dL (ref 8.9–10.3)
Chloride: 107 mmol/L (ref 98–111)
Creatinine: 0.68 mg/dL (ref 0.44–1.00)
GFR, Estimated: >60 mL/min (ref >60)
Glucose, Bld: 107 mg/dL — ABNORMAL HIGH (ref 70–99)
Potassium: 3.4 mmol/L — ABNORMAL LOW (ref 3.5–5.1)
Sodium: 144 mmol/L (ref 135–145)
Total Bilirubin: <0.2 mg/dL — ABNORMAL LOW (ref 0.3–1.2)
Total Protein: 7 g/dL (ref 6.5–8.1)

## 2020-11-19 MED ORDER — SODIUM CHLORIDE 0.9 % IV SOLN
800.0000 mg/m2 | Freq: Once | INTRAVENOUS | Status: AC
  Administered 2020-11-19: 1634 mg via INTRAVENOUS
  Filled 2020-11-19: qty 42.98

## 2020-11-19 MED ORDER — PROCHLORPERAZINE MALEATE 10 MG PO TABS
ORAL_TABLET | ORAL | Status: AC
  Filled 2020-11-19: qty 1

## 2020-11-19 MED ORDER — PROCHLORPERAZINE MALEATE 10 MG PO TABS
10.0000 mg | ORAL_TABLET | Freq: Once | ORAL | Status: AC
Start: 2020-11-19 — End: 2020-11-19
  Administered 2020-11-19: 10 mg via ORAL

## 2020-11-19 MED ORDER — SODIUM CHLORIDE 0.9 % IV SOLN
Freq: Once | INTRAVENOUS | Status: AC
  Filled 2020-11-19: qty 250

## 2020-11-19 MED ORDER — SODIUM CHLORIDE 0.9% FLUSH
10.0000 mL | Freq: Once | INTRAVENOUS | Status: AC
  Administered 2020-11-19: 10 mL
  Filled 2020-11-19: qty 10

## 2020-11-19 MED ORDER — SODIUM CHLORIDE 0.9% FLUSH
10.0000 mL | INTRAVENOUS | Status: DC | PRN
  Administered 2020-11-19: 10 mL
  Filled 2020-11-19: qty 10

## 2020-11-19 MED ORDER — HEPARIN SOD (PORK) LOCK FLUSH 100 UNIT/ML IV SOLN
500.0000 [IU] | Freq: Once | INTRAVENOUS | Status: AC | PRN
  Administered 2020-11-19: 500 [IU]
  Filled 2020-11-19: qty 5

## 2020-11-19 NOTE — Progress Notes (Signed)
Ok to treat per Dr Alvy Bimler Anc 1.3

## 2020-11-19 NOTE — Patient Instructions (Signed)
[facility] Discharge Instructions for Patients Receiving Chemotherapy  Today you received the following chemotherapy agents: gemcitabine.  To help prevent nausea and vomiting after your treatment, we encourage you to take your nausea medication as directed.   If you develop nausea and vomiting that is not controlled by your nausea medication, call the clinic.   BELOW ARE SYMPTOMS THAT SHOULD BE REPORTED IMMEDIATELY:  *FEVER GREATER THAN 100.5 F  *CHILLS WITH OR WITHOUT FEVER  NAUSEA AND VOMITING THAT IS NOT CONTROLLED WITH YOUR NAUSEA MEDICATION  *UNUSUAL SHORTNESS OF BREATH  *UNUSUAL BRUISING OR BLEEDING  TENDERNESS IN MOUTH AND THROAT WITH OR WITHOUT PRESENCE OF ULCERS  *URINARY PROBLEMS  *BOWEL PROBLEMS  UNUSUAL RASH Items with * indicate a potential emergency and should be followed up as soon as possible.  Feel free to call the clinic should you have any questions or concerns. The clinic phone number is [phone].  Please show the CHEMO ALERT CARD at check-in to the Emergency Department and triage nurse.   

## 2020-11-26 NOTE — Progress Notes (Signed)
..  The following Assist/Replace Program for [facility] from [employer] has been terminated due to Medicaid coverage starting 11/02/2020.  Last DOS: 11/05/2020.

## 2020-11-30 ENCOUNTER — Other Ambulatory Visit: Payer: Self-pay | Admitting: Hematology and Oncology

## 2020-11-30 DIAGNOSIS — C562 Malignant neoplasm of left ovary: Secondary | ICD-10-CM

## 2020-12-03 ENCOUNTER — Other Ambulatory Visit (HOSPITAL_COMMUNITY): Payer: Self-pay

## 2020-12-03 ENCOUNTER — Inpatient Hospital Stay: Payer: Medicaid Other | Attending: Gynecologic Oncology

## 2020-12-03 ENCOUNTER — Inpatient Hospital Stay: Payer: Medicaid Other

## 2020-12-03 ENCOUNTER — Other Ambulatory Visit: Payer: Self-pay

## 2020-12-03 ENCOUNTER — Encounter: Payer: Self-pay | Admitting: Hematology and Oncology

## 2020-12-03 ENCOUNTER — Inpatient Hospital Stay (HOSPITAL_BASED_OUTPATIENT_CLINIC_OR_DEPARTMENT_OTHER): Payer: Medicaid Other | Admitting: Hematology and Oncology

## 2020-12-03 VITALS — BP 116/79 | HR 85 | Temp 98.2°F | Resp 18 | Ht 64.0 in | Wt 202.4 lb

## 2020-12-03 DIAGNOSIS — C786 Secondary malignant neoplasm of retroperitoneum and peritoneum: Secondary | ICD-10-CM | POA: Insufficient documentation

## 2020-12-03 DIAGNOSIS — C562 Malignant neoplasm of left ovary: Secondary | ICD-10-CM

## 2020-12-03 DIAGNOSIS — D6481 Anemia due to antineoplastic chemotherapy: Secondary | ICD-10-CM | POA: Diagnosis not present

## 2020-12-03 DIAGNOSIS — Z5112 Encounter for antineoplastic immunotherapy: Secondary | ICD-10-CM | POA: Insufficient documentation

## 2020-12-03 DIAGNOSIS — Z5111 Encounter for antineoplastic chemotherapy: Secondary | ICD-10-CM | POA: Diagnosis present

## 2020-12-03 DIAGNOSIS — E669 Obesity, unspecified: Secondary | ICD-10-CM | POA: Insufficient documentation

## 2020-12-03 DIAGNOSIS — T451X5A Adverse effect of antineoplastic and immunosuppressive drugs, initial encounter: Secondary | ICD-10-CM | POA: Diagnosis not present

## 2020-12-03 DIAGNOSIS — Z6835 Body mass index (BMI) 35.0-35.9, adult: Secondary | ICD-10-CM | POA: Insufficient documentation

## 2020-12-03 DIAGNOSIS — G893 Neoplasm related pain (acute) (chronic): Secondary | ICD-10-CM | POA: Diagnosis not present

## 2020-12-03 DIAGNOSIS — R5383 Other fatigue: Secondary | ICD-10-CM | POA: Insufficient documentation

## 2020-12-03 DIAGNOSIS — Z79899 Other long term (current) drug therapy: Secondary | ICD-10-CM | POA: Insufficient documentation

## 2020-12-03 DIAGNOSIS — D61818 Other pancytopenia: Secondary | ICD-10-CM | POA: Insufficient documentation

## 2020-12-03 LAB — CMP (CANCER CENTER ONLY)
ALT: 20 U/L (ref 0–44)
AST: 24 U/L (ref 15–41)
Albumin: 3.6 g/dL (ref 3.5–5.0)
Alkaline Phosphatase: 85 U/L (ref 38–126)
Anion gap: 11 (ref 5–15)
BUN: 9 mg/dL (ref 6–20)
CO2: 26 mmol/L (ref 22–32)
Calcium: 9 mg/dL (ref 8.9–10.3)
Chloride: 104 mmol/L (ref 98–111)
Creatinine: 0.69 mg/dL (ref 0.44–1.00)
GFR, Estimated: >60 mL/min (ref >60)
Glucose, Bld: 151 mg/dL — ABNORMAL HIGH (ref 70–99)
Potassium: 4.1 mmol/L (ref 3.5–5.1)
Sodium: 141 mmol/L (ref 135–145)
Total Bilirubin: <0.2 mg/dL — ABNORMAL LOW (ref 0.3–1.2)
Total Protein: 6.8 g/dL (ref 6.5–8.1)

## 2020-12-03 LAB — CBC WITH DIFFERENTIAL (CANCER CENTER ONLY)
Abs Immature Granulocytes: 0.01 10*3/uL (ref 0.00–0.07)
Basophils Absolute: 0 10*3/uL (ref 0.0–0.1)
Basophils Relative: 0 %
Eosinophils Absolute: 0.2 10*3/uL (ref 0.0–0.5)
Eosinophils Relative: 4 %
HCT: 29.9 % — ABNORMAL LOW (ref 36.0–46.0)
Hemoglobin: 9.4 g/dL — ABNORMAL LOW (ref 12.0–15.0)
Immature Granulocytes: 0 %
Lymphocytes Relative: 35 %
Lymphs Abs: 1.6 10*3/uL (ref 0.7–4.0)
MCH: 27.6 pg (ref 26.0–34.0)
MCHC: 31.4 g/dL (ref 30.0–36.0)
MCV: 87.9 fL (ref 80.0–100.0)
Monocytes Absolute: 0.4 10*3/uL (ref 0.1–1.0)
Monocytes Relative: 9 %
Neutro Abs: 2.3 10*3/uL (ref 1.7–7.7)
Neutrophils Relative %: 52 %
Platelet Count: 408 10*3/uL — ABNORMAL HIGH (ref 150–400)
RBC: 3.4 MIL/uL — ABNORMAL LOW (ref 3.87–5.11)
RDW: 15.3 % (ref 11.5–15.5)
WBC Count: 4.5 10*3/uL (ref 4.0–10.5)
nRBC: 0 % (ref 0.0–0.2)

## 2020-12-03 LAB — TOTAL PROTEIN, URINE DIPSTICK: Protein, ur: NEGATIVE mg/dL

## 2020-12-03 MED ORDER — HEPARIN SOD (PORK) LOCK FLUSH 100 UNIT/ML IV SOLN
500.0000 [IU] | Freq: Once | INTRAVENOUS | Status: AC | PRN
  Administered 2020-12-03: 500 [IU]
  Filled 2020-12-03: qty 5

## 2020-12-03 MED ORDER — SODIUM CHLORIDE 0.9 % IV SOLN
15.0000 mg/kg | Freq: Once | INTRAVENOUS | Status: AC
  Administered 2020-12-03: 1400 mg via INTRAVENOUS
  Filled 2020-12-03: qty 48

## 2020-12-03 MED ORDER — METFORMIN HCL 500 MG PO TABS
ORAL_TABLET | Freq: Two times a day (BID) | ORAL | 1 refills | Status: DC
  Filled 2020-12-03: qty 60, 30d supply, fill #0

## 2020-12-03 MED ORDER — SODIUM CHLORIDE 0.9% FLUSH
10.0000 mL | INTRAVENOUS | Status: DC | PRN
  Administered 2020-12-03: 10 mL
  Filled 2020-12-03: qty 10

## 2020-12-03 MED ORDER — SODIUM CHLORIDE 0.9 % IV SOLN
800.0000 mg/m2 | Freq: Once | INTRAVENOUS | Status: AC
  Administered 2020-12-03: 1634 mg via INTRAVENOUS
  Filled 2020-12-03: qty 42.98

## 2020-12-03 MED ORDER — SODIUM CHLORIDE 0.9 % IV SOLN
Freq: Once | INTRAVENOUS | Status: AC
  Filled 2020-12-03: qty 250

## 2020-12-03 MED ORDER — PROCHLORPERAZINE MALEATE 10 MG PO TABS
10.0000 mg | ORAL_TABLET | Freq: Once | ORAL | Status: AC
  Administered 2020-12-03: 10 mg via ORAL

## 2020-12-03 MED ORDER — PROCHLORPERAZINE MALEATE 10 MG PO TABS
ORAL_TABLET | ORAL | Status: AC
  Filled 2020-12-03: qty 1

## 2020-12-03 NOTE — Assessment & Plan Note (Signed)
The patient is prediabetic Discussed the risk and benefits of metformin and she is in agreement to proceed I refill her prescription today

## 2020-12-03 NOTE — Assessment & Plan Note (Signed)
Her recent tumor markers show positive response to therapy Clinically, she appears to be responding to treatment She will get gemcitabine with bevacizumab I plan to repeat imaging study in 2 weeks for further follow-up

## 2020-12-03 NOTE — Patient Instructions (Signed)
[facility] MEDICAL ONCOLOGY  Discharge Instructions: Thank you for choosing [facility] to provide your oncology and hematology care.   If you have a lab appointment with the Cancer Center, please go directly to the Cancer Center and check in at the registration area.   Wear comfortable clothing and clothing appropriate for easy access to any Portacath or PICC line.   We strive to give you quality time with your provider. You may need to reschedule your appointment if you arrive late (15 or more minutes).  Arriving late affects you and other patients whose appointments are after yours.  Also, if you miss three or more appointments without notifying the office, you may be dismissed from the clinic at the provider's discretion.      For prescription refill requests, have your pharmacy contact our office and allow 72 hours for refills to be completed.    Today you received the following chemotherapy and/or immunotherapy agents: bevacizumab and gemcitabine      To help prevent nausea and vomiting after your treatment, we encourage you to take your nausea medication as directed.  BELOW ARE SYMPTOMS THAT SHOULD BE REPORTED IMMEDIATELY: *FEVER GREATER THAN 100.4 F (38 C) OR HIGHER *CHILLS OR SWEATING *NAUSEA AND VOMITING THAT IS NOT CONTROLLED WITH YOUR NAUSEA MEDICATION *UNUSUAL SHORTNESS OF BREATH *UNUSUAL BRUISING OR BLEEDING *URINARY PROBLEMS (pain or burning when urinating, or frequent urination) *BOWEL PROBLEMS (unusual diarrhea, constipation, pain near the anus) TENDERNESS IN MOUTH AND THROAT WITH OR WITHOUT PRESENCE OF ULCERS (sore throat, sores in mouth, or a toothache) UNUSUAL RASH, SWELLING OR PAIN  UNUSUAL VAGINAL DISCHARGE OR ITCHING   Items with * indicate a potential emergency and should be followed up as soon as possible or go to the Emergency Department if any problems should occur.  Please show the CHEMOTHERAPY ALERT CARD or IMMUNOTHERAPY ALERT  CARD at check-in to the Emergency Department and triage nurse.  Should you have questions after your visit or need to cancel or reschedule your appointment, please contact [facility] MEDICAL ONCOLOG[facility]  Dept: [phone]  and follow the prompts.  Office hours are 8:00 a.m. to 4:30 p.m. Monday - Friday. Please note that voicemails left after 4:00 p.m. may not be returned until the following business day.  We are closed weekends and major holidays. You have access to a nurse at all times for urgent questions. Please call the main number to the clinic Dept: [phone] and follow the prompts.   For any non-urgent questions, you may also contact your provider using MyChart. We now offer e-Visits for anyone 18 and older to request care online for non-urgent symptoms. For details visit [email or web address].   Also download the MyChart app! Go to the app store, search "MyChart", open the app, select , and log in with your MyChart username and password.  Due to Covid, a mask is required upon entering the hospital/clinic. If you do not have a mask, one will be given to you upon arrival. For doctor visits, patients may have 1 support person aged 18 or older with them. For treatment visits, patients cannot have anyone with them due to current Covid guidelines and our immunocompromised population.   

## 2020-12-03 NOTE — Progress Notes (Signed)
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 CT ABDOMEN PELVIS W CONTRAST    Standing Status:   Future    Standing Expiration Date:   12/03/2021    Order Specific Question:   If indicated for the ordered procedure, I authorize the administration of contrast media per Radiology protocol    Answer:   Yes    Order Specific Question:   Preferred imaging location?    Answer:   [facility]    Order Specific Question:   Radiology Contrast Protocol - do NOT remove file path    Answer:   [email or web address]    Order Specific Question:   Is patient pregnant?    Answer:   No    All questions were answered. The patient knows to call the clinic with any problems, questions or concerns. The total time spent in the appointment was 20 minutes encounter with patients including review of chart and various tests results, discussions about plan of care and coordination of care plan   Heath Lark, MD 12/03/2020 10:52 AM  INTERVAL HISTORY: Please see below for problem oriented charting. 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 There is a 16 cm complex cystic mass in the pelvis containing thickened septations located near midline, abutting the superior aspect of the uterus. I suspect this mass is adnexal/ovarian in origin rather than uterine in origin. Specifically, I suspect the mass likely arises from the left ovary/adnexa and is most consistent with a neoplasm. Malignancy is certainly not excluded on this study. Recommend a pelvic ultrasound for further evaluation. 2. The rounded more solid-appearing structure in the right side of the pelvis is probably the right ovary. Recommend attention on ultrasound. 3. The small amount of fluid in the pelvis is likely reactive to the complex cystic mass likely rising from the left ovary/adnexa. 4. No cause for blood in vomit or black stools identified. No convincing evidence of a perforated or inflamed gastric or duodenal ulcer. 5. Hepatic steatosis.   07/02/2019 Imaging   MRI pelvis 1. 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 There are 2 enhancing nodules within the central upper abdomen raising the possibility of peritoneal carcinomatosis. Additionally, there is a small amount fluid within the pelvis. Malignant ascites not excluded. 2. Fibroid uterus. 3. Hepatic steatosis.   07/02/2019 Imaging   US pelvis 1. There is a large mass in the  pelvis also seen on CT imaging. A separate left ovary is not visualized. 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 The mass is suspicious for malignancy. Recommend gynecologic consultation. 2. The right ovary demonstrates an irregular ill-defined hypoechoic hypervascular region centrally. The findings are  nonspecific in the right ovary. However, given the apparent left ovarian neoplasm suspicious for malignancy, recommend an MRI to further assess the right ovary. 3. Uterine fibroid measuring 3.6 cm.     07/21/2019 Pathology Results   FINAL MICROSCOPIC DIAGNOSIS: A. 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 - See oncology table. B. OMENTUM, RESECTION: - Omental lymph node with metastatic carcinoma (1/1). - Omental adipose tissue with foci of inflammation and reactive mesothelial changes. C. FALLOPIAN TUBE, LEFT, RESECTION: - Fallopian tube with focal, mild inflammation and fibrosis. - No tumor identified. D. UTERUS, CERVIX, RIGHT TUBE AND OVARY: - Cervix Nabothian cyst and squamous metaplasia. - Endometrium Proliferative. No hyperplasia or carcinoma. - Myometrium Leiomyomata. No malignancy. -Right ovary Poorly differentiated carcinoma, 4.8 cm. See oncology table and comment. -Right Fallopian tube Benign paratubal cyst. No endometriosis or malignancy. ONCOLOGY TABLE: OVARY or FALLOPIAN TUBE or PRIMARY PERITONEUM: Procedure: Bilateral f-oophorectomy, hysterectomy and omentectomy. Specimen Integrity: Intact Tumor Site: Left ovary and right ovary. See comment. Ovarian Surface Involvement: Not identified. Fallopian Tube Surface Involvement: Not identified. Tumor Size: Left ovary: 18.6 x 16.0 x 7.2 cm. Right ovary: 4.8 x 3.2 x 3.2 cm. Histologic Type: Clear cell carcinoma. See comment. Histologic Grade: High-grade. Other Tissue/ Organ Involvement: Omental lymph node with metastatic carcinoma. Peritoneal/Ascitic Fluid: Pending. Treatment Effect: No known presurgical therapy. Pathologic Stage Classification (pTNM, AJCC 8th Edition): pT3a, pN1a. Representative Tumor Block: A2, A3, A4, A5 A6, A7 and A8. Comment(s): The left ovarian tumor is 18.6 cm in greatest dimension and has features of clear cell carcinoma. The right ovarian tumor is 4.8 cm  in greatest dimension and is poorly differentiated with focal features consistent with clear cell carcinoma. The pattern of involvement suggests that the right ovarian tumor is likely metastatic from the larger left ovarian clear cell carcinoma. Sections of the omentum show a lymph node with metastatic carcinoma. Sections of the remainder of the omentum do not show involvement by carcinoma.   07/21/2019 Surgery   Procedure(s) Performed: Exploratory laparotomy with radical tumor debulking including total hysterectomy, bilateral salpingo-oophorectomy, infra-gastric omentectomy, and washings.   Surgeon: Jeral Pinch, MD    Operative Findings: On EUA, mobile uterus and ovarian mass, situated out of the pelvis, spanning 6-8 cm above the umbilicus.  On intra-abdominal entry, inflamed omentum adherent to much of the 16 cm left ovarian mass, adhesions easily lysed bluntly.  No nodularity of the ovarian mass however on delivery of the mass through the midline incision, there was Intra-Op rupture of one of the smaller cystic components with drainage of clear brown-tinged fluid.  Grossly normal-appearing left fallopian tube as well as right tube.  Right ovary mildly enlarged measuring approximately 4 cm and firm/nodular, suspicious for tumor involvement.  Uterus 8-10 cm with 4 cm anterior mid body fibroid.  Some very small volume miliary disease along right pelvic sidewall, removed with uterine specimen.  Evidence of small diverticular disease.  Small bowel normal appearing although multiple loops of bowel adherent to each other, especially by mesentery, and an inflammatory manner.  An approximately 2 x 2 centimeter nodule suspicious for tumor implant was found in the omentum just inferior to the greater curvature of the  stomach.  Additional small (less than 5 mm) implants noted on the stomach and posterior aspect of the left lobe of the liver.  Otherwise the liver surface and diaphragm were smooth.   08/03/2019  Cancer Staging   Staging form: Ovary, Fallopian Tube, and Primary Peritoneal Carcinoma, AJCC 8th Edition - Pathologic: Stage IIIA2 (pT3a, pN1, cM0) - Signed by Heath Lark, MD on 08/03/2019   08/12/2019 Procedure   Placement of single lumen port a cath via right internal jugular vein. The catheter tip lies at the cavo-atrial junction. A power injectable port a cath was placed and is ready for immediate use.   08/22/2019 -  Chemotherapy   The patient had carboplatin and taxol for chemotherapy treatment.     09/23/2019 Genetic Testing   Negative genetic testing:  No pathogenic variants detected on the [facility] TumorNext-HRD+CancerNext paired germline and somatic genetic test. The report date is 09/23/2019.  The TumorNext-HRD+CancerNext test offered by [facility] includes paired germline and tumor analyses of 11 genes associated with homologous recombination repair (ATM, BARD1, BRIP1, CHEK2, MRE11A, NBN, PALB2, RAD51C, RAD51D, BRCA1, BRCA2) plus germline analyses of 26 additional genes associated with hereditary cancer (APC, AXIN2, BMPR1A, CDH1, CDK4, CDKN2A, DICER1, HOXB13, EPCAM, GREM1, MLH1, MSH2, MSH3, MSH6, MUTYH, NF1, NTHL1, PMS2, POLD1, POLE, PTEN, RECQL, SMAD4, SMARCA4, STK11, and TP53).   11/14/2019 Tumor Marker   Patient's tumor was tested for the following markers: CA-125 Results of the tumor marker test revealed 10.2.   12/05/2019 Tumor Marker   Patient's tumor was tested for the following markers: CA-125 Results of the tumor marker test revealed 9.9   01/05/2020 Imaging   No specific findings of active malignancy. Interval resection of the pelvic mass. Subtle nodularity along the right side of the vaginal cuff merits surveillance.     01/05/2020 Tumor Marker   Patient's tumor was tested for the following markers: CA-125 Results of the tumor marker test revealed 9.7   02/17/2020 Tumor Marker   Patient's tumor was tested for the following markers: CA-125 Results of the tumor marker test revealed  9.6   04/05/2020 Imaging   1. There is some minimal stranding along the mesentery and omentum without overt omental caking or well-defined nodularity. Faint bandlike density along the anterior peritoneal reflection in the pelvis, slightly more notable than on prior. Overall the appearance is not considered specific for recurrence but given the slight increase in prominence from 01/05/2020, would encourage careful surveillance by tumor markers and imaging. 2. Mild lower lumbar degenerative facet arthropathy.   04/05/2020 Tumor Marker   Patient's tumor was tested for the following markers: CA-125 Results of the tumor marker test revealed 11   05/29/2020 Tumor Marker   Patient's tumor was tested for the following markers: CA-125. Results of the tumor marker test revealed 17.   07/04/2020 Tumor Marker   Patient's tumor was tested for the following markers: CA-125. Results of the tumor marker test revealed 20.1   07/16/2020 Imaging   Stable mild stranding throughout mesenteric and omental fat, without evidence of discrete masses or ascites. No evidence of new or progressive disease.   08/29/2020 Tumor Marker   Patient's tumor was tested for the following markers: CA-125 Results of the tumor marker test revealed 32.7.   09/07/2020 Imaging   1. Interval progression of peritoneal disease within the abdomen and pelvis. Tumor predominantly involves the serosal surface of the large and small bowel loops. No signs of bowel obstruction identified at this time. 2. No evidence for metastatic disease to  the chest.   09/19/2020 Imaging   1. No acute findings identified within the abdomen or pelvis. No evidence for bowel obstruction. 2. Similar appearance of soft tissue thickening along the peritoneal reflections within the abdomen and pelvis compatible with known peritoneal disease. Recently characterized increased soft tissue along the surface of the jejunal bowel loops and tethering of pelvic small bowel  loops is difficult to quantify (and compared with previous exam) due to lack of IV contrast material.     09/20/2020 -  Chemotherapy    Patient is on Treatment Plan: OVARIAN RECURRENT 3RD LINE CARBOPLATIN D1 / GEMCITABINE D1,8 (4/800) Q21D  Carboplatin was discontinued after 10/15/2020 due to allergic reaction      10/01/2020 Tumor Marker   Patient's tumor was tested for the following markers: CA-125. Results of the tumor marker test revealed 37.2.   10/15/2020 Tumor Marker   Patient's tumor was tested for the following markers: CA-125. Results of the tumor marker test revealed 48   10/22/2020 Tumor Marker   Patient's tumor was tested for the following markers: CA-125 Results of the tumor marker test revealed 28.5   11/05/2020 Tumor Marker   Patient's tumor was tested for the following markers: CA-125 Results of the tumor marker test revealed 18.9     REVIEW OF SYSTEMS:   Constitutional: Denies fevers, chills or abnormal weight loss Eyes: Denies blurriness of vision Ears, nose, mouth, throat, and face: Denies mucositis or sore throat Respiratory: Denies cough, dyspnea or wheezes Cardiovascular: Denies palpitation, chest discomfort or lower extremity swelling Skin: Denies abnormal skin rashes Lymphatics: Denies new lymphadenopathy or easy bruising Neurological:Denies numbness, tingling or new weaknesses Behavioral/Psych: Mood is stable, no new changes  All other systems were reviewed with the patient and are negative.  I have reviewed the past medical history, past surgical history, social history and family history with the patient and they are unchanged from previous note.  ALLERGIES:  is allergic to carboplatin.  MEDICATIONS:  Current Outpatient Medications  Medication Sig Dispense Refill  . acetaminophen (TYLENOL) 500 MG tablet Take 1,000 mg by mouth every 6 (six) hours as needed for moderate pain.     Marland Kitchen lidocaine-prilocaine (EMLA) cream APPLY TO THE AFFECTED AREA AS  DIRECTED TO ACCESS PORT 30 g 11  . metFORMIN (GLUCOPHAGE) 500 MG tablet TAKE 1 TABLET BY MOUTH TWICE DAILY WITH A MEAL 60 tablet 1  . morphine (MSIR) 15 MG tablet Take 1 tablet (15 mg total) by mouth every 4 (four) hours as needed. 60 tablet 0  . Multiple Vitamin (MULTIVITAMIN WITH MINERALS) TABS tablet Take 1 tablet by mouth daily.    . ondansetron (ZOFRAN) 8 MG tablet TAKE 1 TABLET BY MOUTH EVERY 8 HOURS AS NEEDED FOR NAUSEA OR VOMITING 60 tablet 1  . pantoprazole (PROTONIX) 40 MG tablet TAKE 1 TABLET BY MOUTH DAILY 30 tablet 11  . prochlorperazine (COMPAZINE) 10 MG tablet Take 10 mg by mouth every 6 (six) hours as needed.     No current facility-administered medications for this visit.   Facility-Administered Medications Ordered in Other Visits  Medication Dose Route Frequency Provider Last Rate Last Admin  . bevacizumab-awwb (MVASI) 1,400 mg in sodium chloride 0.9 % 100 mL chemo infusion  15 mg/kg (Treatment Plan Recorded) Intravenous Once Alvy Bimler, Shawntee Mainwaring, MD      . gemcitabine (GEMZAR) 1,634 mg in sodium chloride 0.9 % 250 mL chemo infusion  800 mg/m2 (Treatment Plan Recorded) Intravenous Once Heath Lark, MD      . heparin  lock flush 100 unit/mL  500 Units Intracatheter Once PRN Alvy Bimler, Kiante Ciavarella, MD      . sodium chloride flush (NS) 0.9 % injection 10 mL  10 mL Intracatheter PRN Alvy Bimler, Marveen Donlon, MD        PHYSICAL EXAMINATION: ECOG PERFORMANCE STATUS: 1 - Symptomatic but completely ambulatory  Vitals:   12/03/20 0932  BP: 116/79  Pulse: 85  Resp: 18  Temp: 98.2 F (36.8 C)  SpO2: 100%   Filed Weights   12/03/20 0932  Weight: 202 lb 6.4 oz (91.8 kg)    GENERAL:alert, no distress and comfortable  Musculoskeletal:no cyanosis of digits and no clubbing  NEURO: alert & oriented x 3 with fluent speech, no focal motor/sensory deficits  LABORATORY DATA:  I have reviewed the data as listed    Component Value Date/Time   NA 141 12/03/2020 0914   K 4.1 12/03/2020 0914   CL 104 12/03/2020  0914   CO2 26 12/03/2020 0914   GLUCOSE 151 (H) 12/03/2020 0914   BUN 9 12/03/2020 0914   CREATININE 0.69 12/03/2020 0914   CALCIUM 9.0 12/03/2020 0914   PROT 6.8 12/03/2020 0914   ALBUMIN 3.6 12/03/2020 0914   AST 24 12/03/2020 0914   ALT 20 12/03/2020 0914   ALKPHOS 85 12/03/2020 0914   BILITOT <0.2 (L) 12/03/2020 0914   GFRNONAA >60 12/03/2020 0914   GFRAA >60 04/05/2020 1008    No results found for: SPEP, UPEP  Lab Results  Component Value Date   WBC 4.5 12/03/2020   NEUTROABS 2.3 12/03/2020   HGB 9.4 (L) 12/03/2020   HCT 29.9 (L) 12/03/2020   MCV 87.9 12/03/2020   PLT 408 (H) 12/03/2020      Chemistry      Component Value Date/Time   NA 141 12/03/2020 0914   K 4.1 12/03/2020 0914   CL 104 12/03/2020 0914   CO2 26 12/03/2020 0914   BUN 9 12/03/2020 0914   CREATININE 0.69 12/03/2020 0914      Component Value Date/Time   CALCIUM 9.0 12/03/2020 0914   ALKPHOS 85 12/03/2020 0914   AST 24 12/03/2020 0914   ALT 20 12/03/2020 0914   BILITOT <0.2 (L) 12/03/2020 0914

## 2020-12-03 NOTE — Assessment & Plan Note (Signed)
This is multifactorial, likely anemia of chronic disease She is not symptomatic Observe for now 

## 2020-12-04 ENCOUNTER — Telehealth: Payer: Self-pay | Admitting: Hematology and Oncology

## 2020-12-04 NOTE — Telephone Encounter (Signed)
Scheduled appt per 5/2 sch msg. Pt aware.  

## 2020-12-10 ENCOUNTER — Inpatient Hospital Stay: Payer: Medicaid Other

## 2020-12-10 ENCOUNTER — Other Ambulatory Visit: Payer: Self-pay

## 2020-12-10 ENCOUNTER — Inpatient Hospital Stay (HOSPITAL_BASED_OUTPATIENT_CLINIC_OR_DEPARTMENT_OTHER): Payer: Medicaid Other | Admitting: Hematology and Oncology

## 2020-12-10 ENCOUNTER — Other Ambulatory Visit (HOSPITAL_COMMUNITY): Payer: Self-pay

## 2020-12-10 ENCOUNTER — Encounter: Payer: Self-pay | Admitting: Hematology and Oncology

## 2020-12-10 DIAGNOSIS — C562 Malignant neoplasm of left ovary: Secondary | ICD-10-CM

## 2020-12-10 DIAGNOSIS — Z5112 Encounter for antineoplastic immunotherapy: Secondary | ICD-10-CM | POA: Diagnosis not present

## 2020-12-10 DIAGNOSIS — G893 Neoplasm related pain (acute) (chronic): Secondary | ICD-10-CM

## 2020-12-10 DIAGNOSIS — K089 Disorder of teeth and supporting structures, unspecified: Secondary | ICD-10-CM

## 2020-12-10 DIAGNOSIS — D61818 Other pancytopenia: Secondary | ICD-10-CM

## 2020-12-10 LAB — CMP (CANCER CENTER ONLY)
ALT: 38 U/L (ref 0–44)
AST: 29 U/L (ref 15–41)
Albumin: 3.6 g/dL (ref 3.5–5.0)
Alkaline Phosphatase: 99 U/L (ref 38–126)
Anion gap: 11 (ref 5–15)
BUN: 11 mg/dL (ref 6–20)
CO2: 26 mmol/L (ref 22–32)
Calcium: 9.6 mg/dL (ref 8.9–10.3)
Chloride: 107 mmol/L (ref 98–111)
Creatinine: 0.69 mg/dL (ref 0.44–1.00)
GFR, Estimated: >60 mL/min (ref >60)
Glucose, Bld: 121 mg/dL — ABNORMAL HIGH (ref 70–99)
Potassium: 3.8 mmol/L (ref 3.5–5.1)
Sodium: 144 mmol/L (ref 135–145)
Total Bilirubin: <0.2 mg/dL — ABNORMAL LOW (ref 0.3–1.2)
Total Protein: 7.3 g/dL (ref 6.5–8.1)

## 2020-12-10 LAB — CBC WITH DIFFERENTIAL (CANCER CENTER ONLY)
Abs Immature Granulocytes: 0.01 10*3/uL (ref 0.00–0.07)
Basophils Absolute: 0 10*3/uL (ref 0.0–0.1)
Basophils Relative: 1 %
Eosinophils Absolute: 0 10*3/uL (ref 0.0–0.5)
Eosinophils Relative: 1 %
HCT: 29.7 % — ABNORMAL LOW (ref 36.0–46.0)
Hemoglobin: 9.7 g/dL — ABNORMAL LOW (ref 12.0–15.0)
Immature Granulocytes: 1 %
Lymphocytes Relative: 59 %
Lymphs Abs: 1.3 10*3/uL (ref 0.7–4.0)
MCH: 28.2 pg (ref 26.0–34.0)
MCHC: 32.7 g/dL (ref 30.0–36.0)
MCV: 86.3 fL (ref 80.0–100.0)
Monocytes Absolute: 0.2 10*3/uL (ref 0.1–1.0)
Monocytes Relative: 9 %
Neutro Abs: 0.6 10*3/uL — ABNORMAL LOW (ref 1.7–7.7)
Neutrophils Relative %: 29 %
Platelet Count: 456 10*3/uL — ABNORMAL HIGH (ref 150–400)
RBC: 3.44 MIL/uL — ABNORMAL LOW (ref 3.87–5.11)
RDW: 14.3 % (ref 11.5–15.5)
WBC Count: 2.1 10*3/uL — ABNORMAL LOW (ref 4.0–10.5)
nRBC: 0 % (ref 0.0–0.2)

## 2020-12-10 MED ORDER — AMOXICILLIN 500 MG PO CAPS
500.0000 mg | ORAL_CAPSULE | Freq: Two times a day (BID) | ORAL | 0 refills | Status: DC
  Filled 2020-12-10: qty 14, 7d supply, fill #0

## 2020-12-10 MED ORDER — SODIUM CHLORIDE 0.9% FLUSH
10.0000 mL | Freq: Once | INTRAVENOUS | Status: AC
  Administered 2020-12-10: 10 mL
  Filled 2020-12-10: qty 10

## 2020-12-10 MED ORDER — MORPHINE SULFATE 15 MG PO TABS
15.0000 mg | ORAL_TABLET | ORAL | 0 refills | Status: DC | PRN
  Filled 2020-12-10: qty 60, 7d supply, fill #0

## 2020-12-10 NOTE — Assessment & Plan Note (Signed)
She has poor dentition and has an active tooth infection As above, I will place her on antibiotics She will attempt to contact her dentist for dental extraction While it is not advisable to proceed with surgical extraction after recent chemotherapy, at current time, I felt that it is prudent for her to proceed with dental extraction to remove the source of infection She would be at high risk of bleeding due to recent treatment but we will continue supportive care I will see her next week for further follow-up

## 2020-12-10 NOTE — Progress Notes (Signed)
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[place]) Due to her pancytopenia and active tooth infection, I will cancel her treatment today I recommend the patient proceed to call and schedule her CT imaging to be done next week and I will see her back next week to review test results Clinically, I believe she has responded to treatment  Pancytopenia, acquired ([place]) She has severe pancytopenia due to recent treatment She has active tooth infection I will start her on oral antibiotics and will hold her treatment today She does not need G-CSF support  Poor dentition She has poor dentition and has an active tooth infection As above, I will place her on antibiotics She will attempt to contact her dentist for dental extraction While it is not advisable to proceed with surgical extraction after recent chemotherapy, at current time, I felt that it is prudent for her to proceed with dental extraction to remove the source of infection She would be at high risk of bleeding due to recent treatment but we will continue supportive care I will see her next week for further follow-up  Cancer associated pain Her pain is manageable with morphine sulfate We will continue similar pain medicine and I refill her prescription today   No orders of the defined types were placed in this encounter.   All questions were answered. The patient knows to call the clinic with any problems, questions or concerns. The total time spent in the appointment was 30 minutes encounter with patients including review of chart and various tests results, discussions about plan of care and coordination of care plan   Heath Lark, MD 12/10/2020 2:57 PM  INTERVAL HISTORY: Please see below for problem oriented charting. 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 There is a 16 cm complex cystic mass in the pelvis containing thickened septations located near midline, abutting the superior aspect of the uterus. I suspect this mass is adnexal/ovarian in origin rather than uterine in origin. Specifically, I suspect the mass likely arises from the left ovary/adnexa and is most consistent with a neoplasm. Malignancy is certainly not excluded on this study. Recommend a pelvic ultrasound for further evaluation. 2. The rounded more solid-appearing structure in the right side of the pelvis is probably the right ovary. Recommend attention on ultrasound. 3. The small amount of fluid in the pelvis is likely reactive to the complex cystic mass likely rising from the left ovary/adnexa. 4. No cause for blood in vomit or black stools identified. No convincing evidence of a perforated or inflamed gastric or duodenal ulcer. 5. Hepatic steatosis.   07/02/2019 Imaging   MRI pelvis 1. 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 There are 2 enhancing nodules within the central upper abdomen raising the possibility of peritoneal carcinomatosis. Additionally, there is a small amount fluid within the pelvis. Malignant ascites not excluded. 2.  Fibroid uterus. 3. Hepatic steatosis.   07/02/2019 Imaging   US pelvis 1. There is a large mass in the pelvis also seen on CT imaging. A separate left ovary is not visualized. 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 The mass  is suspicious for malignancy. Recommend gynecologic consultation. 2. The right ovary demonstrates an irregular ill-defined hypoechoic hypervascular region centrally. The findings are nonspecific in the right ovary. However, given the apparent left ovarian neoplasm suspicious for malignancy, recommend an MRI to further assess the right ovary. 3. Uterine fibroid measuring 3.6 cm.     07/21/2019 Pathology Results   FINAL MICROSCOPIC DIAGNOSIS: A. 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 - See oncology table. B. OMENTUM, RESECTION: - Omental lymph node with metastatic carcinoma (1/1). - Omental adipose tissue with foci of inflammation and reactive mesothelial changes. C. FALLOPIAN TUBE, LEFT, RESECTION: - Fallopian tube with focal, mild inflammation and fibrosis. - No tumor identified. D. UTERUS, CERVIX, RIGHT TUBE AND OVARY: - Cervix Nabothian cyst and squamous metaplasia. - Endometrium Proliferative. No hyperplasia or carcinoma. - Myometrium Leiomyomata. No malignancy. -Right ovary Poorly differentiated carcinoma, 4.8 cm. See oncology table and comment. -Right Fallopian tube Benign paratubal cyst. No endometriosis or malignancy. ONCOLOGY TABLE: OVARY or FALLOPIAN TUBE or PRIMARY PERITONEUM: Procedure: Bilateral f-oophorectomy, hysterectomy and omentectomy. Specimen Integrity: Intact Tumor Site: Left ovary and right ovary. See comment. Ovarian Surface Involvement: Not identified. Fallopian Tube Surface Involvement: Not identified. Tumor Size: Left ovary: 18.6 x 16.0 x 7.2 cm. Right ovary: 4.8 x 3.2 x 3.2 cm. Histologic Type: Clear cell carcinoma. See comment. Histologic Grade: High-grade. Other Tissue/ Organ Involvement: Omental lymph node with metastatic carcinoma. Peritoneal/Ascitic Fluid: Pending. Treatment Effect: No known presurgical therapy. Pathologic Stage Classification (pTNM, AJCC 8th Edition): pT3a, pN1a. Representative Tumor  Block: A2, A3, A4, A5 A6, A7 and A8. Comment(s): The left ovarian tumor is 18.6 cm in greatest dimension and has features of clear cell carcinoma. The right ovarian tumor is 4.8 cm in greatest dimension and is poorly differentiated with focal features consistent with clear cell carcinoma. The pattern of involvement suggests that the right ovarian tumor is likely metastatic from the larger left ovarian clear cell carcinoma. Sections of the omentum show a lymph node with metastatic carcinoma. Sections of the remainder of the omentum do not show involvement by carcinoma.   07/21/2019 Surgery   Procedure(s) Performed: Exploratory laparotomy with radical tumor debulking including total hysterectomy, bilateral salpingo-oophorectomy, infra-gastric omentectomy, and washings.   Surgeon: Jeral Pinch, MD    Operative Findings: On EUA, mobile uterus and ovarian mass, situated out of the pelvis, spanning 6-8 cm above the umbilicus.  On intra-abdominal entry, inflamed omentum adherent to much of the 16 cm left ovarian mass, adhesions easily lysed bluntly.  No nodularity of the ovarian mass however on delivery of the mass through the midline incision, there was Intra-Op rupture of one of the smaller cystic components with drainage of clear brown-tinged fluid.  Grossly normal-appearing left fallopian tube as well as right tube.  Right ovary mildly enlarged measuring approximately 4 cm and firm/nodular, suspicious for tumor involvement.  Uterus 8-10 cm with 4 cm anterior mid body fibroid.  Some very small volume miliary disease along right pelvic sidewall, removed with uterine specimen.  Evidence of small diverticular disease.  Small bowel normal appearing although multiple loops of bowel adherent to each other, especially by mesentery, and an inflammatory manner.  An approximately 2  x 2 centimeter nodule suspicious for tumor implant was found in the omentum just inferior to the greater curvature of the stomach.   Additional small (less than 5 mm) implants noted on the stomach and posterior aspect of the left lobe of the liver.  Otherwise the liver surface and diaphragm were smooth.   08/03/2019 Cancer Staging   Staging form: Ovary, Fallopian Tube, and Primary Peritoneal Carcinoma, AJCC 8th Edition - Pathologic: Stage IIIA2 (pT3a, pN1, cM0) - Signed by Heath Lark, MD on 08/03/2019   08/12/2019 Procedure   Placement of single lumen port a cath via right internal jugular vein. The catheter tip lies at the cavo-atrial junction. A power injectable port a cath was placed and is ready for immediate use.   08/22/2019 -  Chemotherapy   The patient had carboplatin and taxol for chemotherapy treatment.     09/23/2019 Genetic Testing   Negative genetic testing:  No pathogenic variants detected on the [facility] TumorNext-HRD+CancerNext paired germline and somatic genetic test. The report date is 09/23/2019.  The TumorNext-HRD+CancerNext test offered by [facility] includes paired germline and tumor analyses of 11 genes associated with homologous recombination repair (ATM, BARD1, BRIP1, CHEK2, MRE11A, NBN, PALB2, RAD51C, RAD51D, BRCA1, BRCA2) plus germline analyses of 26 additional genes associated with hereditary cancer (APC, AXIN2, BMPR1A, CDH1, CDK4, CDKN2A, DICER1, HOXB13, EPCAM, GREM1, MLH1, MSH2, MSH3, MSH6, MUTYH, NF1, NTHL1, PMS2, POLD1, POLE, PTEN, RECQL, SMAD4, SMARCA4, STK11, and TP53).   11/14/2019 Tumor Marker   Patient's tumor was tested for the following markers: CA-125 Results of the tumor marker test revealed 10.2.   12/05/2019 Tumor Marker   Patient's tumor was tested for the following markers: CA-125 Results of the tumor marker test revealed 9.9   01/05/2020 Imaging   No specific findings of active malignancy. Interval resection of the pelvic mass. Subtle nodularity along the right side of the vaginal cuff merits surveillance.     01/05/2020 Tumor Marker   Patient's tumor was tested for the following markers:  CA-125 Results of the tumor marker test revealed 9.7   02/17/2020 Tumor Marker   Patient's tumor was tested for the following markers: CA-125 Results of the tumor marker test revealed 9.6   04/05/2020 Imaging   1. There is some minimal stranding along the mesentery and omentum without overt omental caking or well-defined nodularity. Faint bandlike density along the anterior peritoneal reflection in the pelvis, slightly more notable than on prior. Overall the appearance is not considered specific for recurrence but given the slight increase in prominence from 01/05/2020, would encourage careful surveillance by tumor markers and imaging. 2. Mild lower lumbar degenerative facet arthropathy.   04/05/2020 Tumor Marker   Patient's tumor was tested for the following markers: CA-125 Results of the tumor marker test revealed 11   05/29/2020 Tumor Marker   Patient's tumor was tested for the following markers: CA-125. Results of the tumor marker test revealed 17.   07/04/2020 Tumor Marker   Patient's tumor was tested for the following markers: CA-125. Results of the tumor marker test revealed 20.1   07/16/2020 Imaging   Stable mild stranding throughout mesenteric and omental fat, without evidence of discrete masses or ascites. No evidence of new or progressive disease.   08/29/2020 Tumor Marker   Patient's tumor was tested for the following markers: CA-125 Results of the tumor marker test revealed 32.7.   09/07/2020 Imaging   1. Interval progression of peritoneal disease within the abdomen and pelvis. Tumor predominantly involves the serosal surface of the  large and small bowel loops. No signs of bowel obstruction identified at this time. 2. No evidence for metastatic disease to the chest.   09/19/2020 Imaging   1. No acute findings identified within the abdomen or pelvis. No evidence for bowel obstruction. 2. Similar appearance of soft tissue thickening along the peritoneal reflections within the  abdomen and pelvis compatible with known peritoneal disease. Recently characterized increased soft tissue along the surface of the jejunal bowel loops and tethering of pelvic small bowel loops is difficult to quantify (and compared with previous exam) due to lack of IV contrast material.     09/20/2020 -  Chemotherapy    Patient is on Treatment Plan: OVARIAN RECURRENT 3RD LINE CARBOPLATIN D1 / GEMCITABINE D1,8 (4/800) Q21D  Carboplatin was discontinued after 10/15/2020 due to allergic reaction      10/01/2020 Tumor Marker   Patient's tumor was tested for the following markers: CA-125. Results of the tumor marker test revealed 37.2.   10/15/2020 Tumor Marker   Patient's tumor was tested for the following markers: CA-125. Results of the tumor marker test revealed 48   10/22/2020 Tumor Marker   Patient's tumor was tested for the following markers: CA-125 Results of the tumor marker test revealed 28.5   11/05/2020 Tumor Marker   Patient's tumor was tested for the following markers: CA-125 Results of the tumor marker test revealed 18.9     REVIEW OF SYSTEMS:   Constitutional: Denies fevers, chills or abnormal weight loss Eyes: Denies blurriness of vision Ears, nose, mouth, throat, and face: Denies mucositis or sore throat Respiratory: Denies cough, dyspnea or wheezes Cardiovascular: Denies palpitation, chest discomfort or lower extremity swelling Gastrointestinal:  Denies nausea, heartburn or change in bowel habits Skin: Denies abnormal skin rashes Lymphatics: Denies new lymphadenopathy or easy bruising Neurological:Denies numbness, tingling or new weaknesses Behavioral/Psych: Mood is stable, no new changes  All other systems were reviewed with the patient and are negative.  I have reviewed the past medical history, past surgical history, social history and family history with the patient and they are unchanged from previous note.  ALLERGIES:  is allergic to  carboplatin.  MEDICATIONS:  Current Outpatient Medications  Medication Sig Dispense Refill  . amoxicillin (AMOXIL) 500 MG capsule Take 1 capsule (500 mg total) by mouth 2 (two) times daily. 14 capsule 0  . acetaminophen (TYLENOL) 500 MG tablet Take 1,000 mg by mouth every 6 (six) hours as needed for moderate pain.     [facility] lidocaine-prilocaine (EMLA) cream APPLY TO THE AFFECTED AREA AS DIRECTED TO ACCESS PORT 30 g 11  . metFORMIN (GLUCOPHAGE) 500 MG tablet TAKE 1 TABLET BY MOUTH TWICE DAILY WITH A MEAL 60 tablet 1  . morphine (MSIR) 15 MG tablet Take 1 tablet (15 mg total) by mouth every 4 (four) hours as needed. 60 tablet 0  . Multiple Vitamin (MULTIVITAMIN WITH MINERALS) TABS tablet Take 1 tablet by mouth daily.    . ondansetron (ZOFRAN) 8 MG tablet TAKE 1 TABLET BY MOUTH EVERY 8 HOURS AS NEEDED FOR NAUSEA OR VOMITING 60 tablet 1  . pantoprazole (PROTONIX) 40 MG tablet TAKE 1 TABLET BY MOUTH DAILY 30 tablet 11  . prochlorperazine (COMPAZINE) 10 MG tablet Take 10 mg by mouth every 6 (six) hours as needed.     No current facility-administered medications for this visit.    PHYSICAL EXAMINATION: ECOG PERFORMANCE STATUS: 1 - Symptomatic but completely ambulatory  Vitals:   12/10/20 1129  BP: 121/87  Pulse: 76  Resp:  18  Temp: (!) 97.5 F (36.4 C)  SpO2: 100%   Filed Weights   12/10/20 1129  Weight: 197 lb 12.8 oz (89.7 kg)    GENERAL:alert, no distress and comfortable SKIN: skin color, texture, turgor are normal, no rashes or significant lesions EYES: normal, Conjunctiva are pink and non-injected, sclera clear OROPHARYNX:no exudate, no erythema and lips, buccal mucosa, and tongue normal.  Noted poor dentition affecting the right upper jaw area NECK: supple, thyroid normal size, non-tender, without nodularity LYMPH:  no palpable lymphadenopathy in the cervical, axillary or inguinal LUNGS: clear to auscultation and percussion with normal breathing effort HEART: regular rate &  rhythm and no murmurs and no lower extremity edema ABDOMEN:abdomen soft, non-tender and normal bowel sounds Musculoskeletal:no cyanosis of digits and no clubbing  NEURO: alert & oriented x 3 with fluent speech, no focal motor/sensory deficits  LABORATORY DATA:  I have reviewed the data as listed    Component Value Date/Time   NA 144 12/10/2020 1057   K 3.8 12/10/2020 1057   CL 107 12/10/2020 1057   CO2 26 12/10/2020 1057   GLUCOSE 121 (H) 12/10/2020 1057   BUN 11 12/10/2020 1057   CREATININE 0.69 12/10/2020 1057   CALCIUM 9.6 12/10/2020 1057   PROT 7.3 12/10/2020 1057   ALBUMIN 3.6 12/10/2020 1057   AST 29 12/10/2020 1057   ALT 38 12/10/2020 1057   ALKPHOS 99 12/10/2020 1057   BILITOT <0.2 (L) 12/10/2020 1057   GFRNONAA >60 12/10/2020 1057   GFRAA >60 04/05/2020 1008    No results found for: SPEP, UPEP  Lab Results  Component Value Date   WBC 2.1 (L) 12/10/2020   NEUTROABS 0.6 (L) 12/10/2020   HGB 9.7 (L) 12/10/2020   HCT 29.7 (L) 12/10/2020   MCV 86.3 12/10/2020   PLT 456 (H) 12/10/2020      Chemistry      Component Value Date/Time   NA 144 12/10/2020 1057   K 3.8 12/10/2020 1057   CL 107 12/10/2020 1057   CO2 26 12/10/2020 1057   BUN 11 12/10/2020 1057   CREATININE 0.69 12/10/2020 1057      Component Value Date/Time   CALCIUM 9.6 12/10/2020 1057   ALKPHOS 99 12/10/2020 1057   AST 29 12/10/2020 1057   ALT 38 12/10/2020 1057   BILITOT <0.2 (L) 12/10/2020 1057

## 2020-12-10 NOTE — Assessment & Plan Note (Signed)
Her pain is manageable with morphine sulfate We will continue similar pain medicine and I refill her prescription today

## 2020-12-10 NOTE — Assessment & Plan Note (Signed)
Due to her pancytopenia and active tooth infection, I will cancel her treatment today I recommend the patient proceed to call and schedule her CT imaging to be done next week and I will see her back next week to review test results Clinically, I believe she has responded to treatment

## 2020-12-10 NOTE — Assessment & Plan Note (Signed)
She has severe pancytopenia due to recent treatment She has active tooth infection I will start her on oral antibiotics and will hold her treatment today She does not need G-CSF support

## 2020-12-11 ENCOUNTER — Encounter: Payer: Self-pay | Admitting: *Deleted

## 2020-12-11 NOTE — Progress Notes (Signed)
RN successfully faxed completed dental form to [phone].

## 2020-12-12 NOTE — Telephone Encounter (Signed)
Connected with [name] [phone]).   "Contacted Medicaid by e-mail sent yesterday.  No further insurance exists.  Medicaid responded the records would be updated.  Please resubmit the PA request."  Second request faxed at this time.

## 2020-12-13 ENCOUNTER — Other Ambulatory Visit: Payer: Self-pay | Admitting: Hematology and Oncology

## 2020-12-13 ENCOUNTER — Other Ambulatory Visit (HOSPITAL_COMMUNITY): Payer: Self-pay

## 2020-12-13 DIAGNOSIS — C562 Malignant neoplasm of left ovary: Secondary | ICD-10-CM

## 2020-12-13 NOTE — Telephone Encounter (Signed)
[employer], [employer] [facility] again denies ability to process Prior Auth.  Connected with Lorene.  "She has [place].  If she sent an e-mail, these may take up to seven days." Lorene  personally investigated e-mails and consulted with team.  "We did not receive an e-mail.  I will send this to my supervisor marked urgent to review,  You may call back tomorrow to inquire."  [name] notified of above.  "I e-mailed my case manager.  I had [facility] not [facility]."   Provided phone number [phone] for [employer] pharmacy benefit manager of [place].  [employer] is pharmacy Dispensing optician for [facility].

## 2020-12-17 ENCOUNTER — Other Ambulatory Visit: Payer: Self-pay

## 2020-12-17 ENCOUNTER — Ambulatory Visit (HOSPITAL_COMMUNITY)
Admission: RE | Admit: 2020-12-17 | Discharge: 2020-12-17 | Disposition: A | Discharge status: Home / Self Care | Payer: Medicaid Other | Source: Ambulatory Visit | Attending: Hematology and Oncology | Admitting: Hematology and Oncology

## 2020-12-17 DIAGNOSIS — C562 Malignant neoplasm of left ovary: Secondary | ICD-10-CM | POA: Diagnosis not present

## 2020-12-18 ENCOUNTER — Encounter: Payer: Self-pay | Admitting: Hematology and Oncology

## 2020-12-18 ENCOUNTER — Inpatient Hospital Stay (HOSPITAL_BASED_OUTPATIENT_CLINIC_OR_DEPARTMENT_OTHER): Payer: Medicaid Other | Admitting: Hematology and Oncology

## 2020-12-18 ENCOUNTER — Inpatient Hospital Stay: Payer: Medicaid Other

## 2020-12-18 ENCOUNTER — Encounter: Payer: Self-pay | Admitting: General Practice

## 2020-12-18 DIAGNOSIS — C562 Malignant neoplasm of left ovary: Secondary | ICD-10-CM

## 2020-12-18 DIAGNOSIS — D6481 Anemia due to antineoplastic chemotherapy: Secondary | ICD-10-CM | POA: Diagnosis not present

## 2020-12-18 DIAGNOSIS — Z5112 Encounter for antineoplastic immunotherapy: Secondary | ICD-10-CM | POA: Diagnosis not present

## 2020-12-18 DIAGNOSIS — R5381 Other malaise: Secondary | ICD-10-CM | POA: Diagnosis not present

## 2020-12-18 DIAGNOSIS — T451X5A Adverse effect of antineoplastic and immunosuppressive drugs, initial encounter: Secondary | ICD-10-CM

## 2020-12-18 DIAGNOSIS — G893 Neoplasm related pain (acute) (chronic): Secondary | ICD-10-CM

## 2020-12-18 LAB — CBC WITH DIFFERENTIAL (CANCER CENTER ONLY)
Abs Immature Granulocytes: 0.01 10*3/uL (ref 0.00–0.07)
Basophils Absolute: 0 10*3/uL (ref 0.0–0.1)
Basophils Relative: 0 %
Eosinophils Absolute: 0.2 10*3/uL (ref 0.0–0.5)
Eosinophils Relative: 3 %
HCT: 33.3 % — ABNORMAL LOW (ref 36.0–46.0)
Hemoglobin: 10.4 g/dL — ABNORMAL LOW (ref 12.0–15.0)
Immature Granulocytes: 0 %
Lymphocytes Relative: 32 %
Lymphs Abs: 1.6 10*3/uL (ref 0.7–4.0)
MCH: 27.4 pg (ref 26.0–34.0)
MCHC: 31.2 g/dL (ref 30.0–36.0)
MCV: 87.6 fL (ref 80.0–100.0)
Monocytes Absolute: 0.5 10*3/uL (ref 0.1–1.0)
Monocytes Relative: 9 %
Neutro Abs: 2.8 10*3/uL (ref 1.7–7.7)
Neutrophils Relative %: 56 %
Platelet Count: 209 10*3/uL (ref 150–400)
RBC: 3.8 MIL/uL — ABNORMAL LOW (ref 3.87–5.11)
RDW: 14.9 % (ref 11.5–15.5)
WBC Count: 5 10*3/uL (ref 4.0–10.5)
nRBC: 0 % (ref 0.0–0.2)

## 2020-12-18 LAB — CMP (CANCER CENTER ONLY)
ALT: 21 U/L (ref 0–44)
AST: 29 U/L (ref 15–41)
Albumin: 3.7 g/dL (ref 3.5–5.0)
Alkaline Phosphatase: 98 U/L (ref 38–126)
Anion gap: 8 (ref 5–15)
BUN: 8 mg/dL (ref 6–20)
CO2: 31 mmol/L (ref 22–32)
Calcium: 9.5 mg/dL (ref 8.9–10.3)
Chloride: 104 mmol/L (ref 98–111)
Creatinine: 0.7 mg/dL (ref 0.44–1.00)
GFR, Estimated: >60 mL/min (ref >60)
Glucose, Bld: 99 mg/dL (ref 70–99)
Potassium: 4.4 mmol/L (ref 3.5–5.1)
Sodium: 143 mmol/L (ref 135–145)
Total Bilirubin: 0.3 mg/dL (ref 0.3–1.2)
Total Protein: 7.1 g/dL (ref 6.5–8.1)

## 2020-12-18 MED ORDER — PROCHLORPERAZINE MALEATE 10 MG PO TABS
10.0000 mg | ORAL_TABLET | Freq: Once | ORAL | Status: AC
  Administered 2020-12-18: 10 mg via ORAL

## 2020-12-18 MED ORDER — SODIUM CHLORIDE 0.9% FLUSH
10.0000 mL | INTRAVENOUS | Status: DC | PRN
  Administered 2020-12-18: 10 mL
  Filled 2020-12-18: qty 10

## 2020-12-18 MED ORDER — PROCHLORPERAZINE MALEATE 10 MG PO TABS
ORAL_TABLET | ORAL | Status: AC
  Filled 2020-12-18: qty 1

## 2020-12-18 MED ORDER — HEPARIN SOD (PORK) LOCK FLUSH 100 UNIT/ML IV SOLN
500.0000 [IU] | Freq: Once | INTRAVENOUS | Status: AC | PRN
  Administered 2020-12-18: 500 [IU]
  Filled 2020-12-18: qty 5

## 2020-12-18 MED ORDER — SODIUM CHLORIDE 0.9 % IV SOLN
Freq: Once | INTRAVENOUS | Status: AC
Start: 2020-12-18 — End: 2020-12-18
  Filled 2020-12-18: qty 250

## 2020-12-18 MED ORDER — SODIUM CHLORIDE 0.9 % IV SOLN
800.0000 mg/m2 | Freq: Once | INTRAVENOUS | Status: AC
  Administered 2020-12-18: 1634 mg via INTRAVENOUS
  Filled 2020-12-18: qty 42.98

## 2020-12-18 NOTE — Assessment & Plan Note (Signed)
Her physical debility from treatment is gradually resolving She still have some fatigue She is still at risk of infection due to history of recurrent pancytopenia She is wondering about whether she can return back to work We discussed goals of care Due to her recurrent disease, she will need to stay on treatment indefinitely to reduce risk of death Some patients can still work part-time while receiving treatment I recommend she have a full discussion with her supervisor to see if special accommodation can be given to her so that she can still work and receive treatment at the same time

## 2020-12-18 NOTE — Assessment & Plan Note (Signed)
Her pancytopenia has improved She has mild anemia but otherwise feeling good We will proceed with treatment today without delay

## 2020-12-18 NOTE — Assessment & Plan Note (Signed)
I have reviewed multiple imaging studies with the patient She still have very fine nodularity in her peritoneum, active disease cannot be ruled out I recommend consideration for either continuing gemcitabine with bevacizumab versus bevacizumab maintenance alone The risk, benefits, side effects of each option were fully discussed and she is in agreement to continue both gemcitabine with bevacizumab Given her history of severe pancytopenia, I plan to modify her treatment to gemcitabine every [redacted] weeks along with bevacizumab every 2 weeks at reduced dose at 10 mg/kg dose in the future Today, she will proceed with single agent gemcitabine only I plan to repeat imaging study in a few months, next due around August

## 2020-12-18 NOTE — Patient Instructions (Signed)
[facility]COL[facility]  Discharge Instructions: ?Thank you for choosing [facility] to provide your oncology and hematology care.  ? ?If you have a lab appointment with the Cancer Center, please go directly to the Cancer Center and check in at the registration area. ?  ?Wear comfortable clothing and clothing appropriate for easy access to any Portacath or PICC line.  ? ?We strive to give you quality time with your provider. You may need to reschedule your appointment if you arrive late (15 or more minutes).  Arriving late affects you and other patients whose appointments are after yours.  Also, if you miss three or more appointments without notifying the office, you may be dismissed from the clinic at the provider?s discretion.    ?  ?For prescription refill requests, have your pharmacy contact our office and allow 72 hours for refills to be completed.   ? ?Today you received the following chemotherapy and/or immunotherapy agents: Gemcitabine.    ?  ?To help prevent nausea and vomiting after your treatment, we encourage you to take your nausea medication as directed. ? ?BELOW ARE SYMPTOMS THAT SHOULD BE REPORTED IMMEDIATELY: ?*FEVER GREATER THAN 100.4 F (38 ?C) OR HIGHER ?*CHILLS OR SWEATING ?*NAUSEA AND VOMITING THAT IS NOT CONTROLLED WITH YOUR NAUSEA MEDICATION ?*UNUSUAL SHORTNESS OF BREATH ?*UNUSUAL BRUISING OR BLEEDING ?*URINARY PROBLEMS (pain or burning when urinating, or frequent urination) ?*BOWEL PROBLEMS (unusual diarrhea, constipation, pain near the anus) ?TENDERNESS IN MOUTH AND THROAT WITH OR WITHOUT PRESENCE OF ULCERS (sore throat, sores in mouth, or a toothache) ?UNUSUAL RASH, SWELLING OR PAIN  ?UNUSUAL VAGINAL DISCHARGE OR ITCHING  ? ?Items with * indicate a potential emergency and should be followed up as soon as possible or go to the Emergency Department if any problems should occur. ? ?Please show the CHEMOTHERAPY ALERT CARD or IMMUNOTHERAPY ALERT CARD at check-in  to the Emergency Department and triage nurse. ? ?Should you have questions after your visit or need to cancel or reschedule your appointment, please contact [facility]  Dept: [phone]  and follow the prompts.  Office hours are 8:00 a.m. to 4:30 p.m. Monday - Friday. Please note that voicemails left after 4:00 p.m. may not be returned until the following business day.  We are closed weekends and major holidays. You have access to a nurse at all times for urgent questions. Please call the main number to the clinic Dept: [phone] and follow the prompts. ? ? ?For any non-urgent questions, you may also contact your provider using MyChart. We now offer e-Visits for anyone 18 and older to request care online for non-urgent symptoms. For details visit [email or web address]. ?  ?Also download the MyChart app! Go to the app store, search "MyChart", open the app, select [facility], and log in with your MyChart username and password. ? ?Due to Covid, a mask is required upon entering the hospital/clinic. If you do not have a mask, one will be given to you upon arrival. For doctor visits, patients may have 1 support person aged 18 or older with them. For treatment visits, patients cannot have anyone with them due to current Covid guidelines and our immunocompromised population.  ? ?

## 2020-12-18 NOTE — Progress Notes (Signed)
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[place]) I have reviewed multiple imaging studies with the patient She still have very fine nodularity in her peritoneum, active disease cannot be ruled out I recommend consideration for either continuing gemcitabine with bevacizumab versus bevacizumab maintenance alone The risk, benefits, side effects of each option were fully discussed and she is in agreement to continue both gemcitabine with bevacizumab Given her history of severe pancytopenia, I plan to modify her treatment to gemcitabine every [redacted] weeks along with bevacizumab every 2 weeks at reduced dose at 10 mg/kg dose in the future Today, she will proceed with single agent gemcitabine only I plan to repeat imaging study in a few months, next due around August  Anemia due to antineoplastic chemotherapy Her pancytopenia has improved She has mild anemia but otherwise feeling good We will proceed with treatment today without delay  Cancer associated pain Her abdominal pain has improved dramatically and is less She does not need pain medicine refill today  Physical debility Her physical debility from treatment is gradually resolving She still have some fatigue She is still at risk of infection due to history of recurrent pancytopenia She is wondering about whether she can return back to work We discussed goals of care Due to her recurrent disease, she will need to stay on treatment indefinitely to reduce risk of death Some patients can still work part-time while receiving treatment I recommend she have a full discussion with her supervisor to see if special accommodation can be given to her so that she can still work and receive treatment at the same time   No orders of the defined types were placed in this encounter.   All questions were answered. The  patient knows to call the clinic with any problems, questions or concerns. The total time spent in the appointment was 40 minutes encounter with patients including review of chart and various tests results, discussions about plan of care and coordination of care plan   Heath Lark, MD 12/18/2020 3:16 PM  INTERVAL HISTORY: Please see below for problem oriented charting. 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 There is a 16 cm complex cystic mass in the pelvis containing thickened septations located near midline, abutting the superior aspect of the uterus. I suspect this mass is adnexal/ovarian in origin rather than uterine in origin. Specifically, I suspect the mass likely arises from the left ovary/adnexa and is most consistent with a neoplasm. Malignancy is certainly not excluded on this study. Recommend a pelvic ultrasound for further evaluation. 2. The rounded more solid-appearing structure in the right side of the pelvis is probably the right ovary. Recommend attention on ultrasound. 3. The small amount of fluid in the pelvis is likely reactive to the complex cystic mass likely rising from the left ovary/adnexa. 4. No cause for blood in vomit or black stools identified. No convincing evidence of a perforated or inflamed gastric or duodenal ulcer. 5. Hepatic steatosis.   07/02/2019 Imaging   MRI  pelvis 1. 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 There are 2 enhancing nodules within the central upper abdomen raising the possibility of  peritoneal carcinomatosis. Additionally, there is a small amount fluid within the pelvis. Malignant ascites not excluded. 2. Fibroid uterus. 3. Hepatic steatosis.   07/02/2019 Imaging   US pelvis 1. There is a large mass in the pelvis also seen on CT imaging. A separate left ovary is not visualized. 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 The mass is suspicious for malignancy. Recommend gynecologic consultation. 2. The right ovary demonstrates an irregular ill-defined hypoechoic hypervascular region centrally. The findings are nonspecific in the right ovary. However, given the apparent left ovarian neoplasm suspicious for malignancy, recommend an MRI to further assess the right ovary. 3. Uterine fibroid measuring 3.6 cm.     07/21/2019 Pathology Results   FINAL MICROSCOPIC DIAGNOSIS: A. 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 - See oncology table. B. OMENTUM, RESECTION: - Omental lymph node with metastatic carcinoma (1/1). - Omental adipose tissue with foci of inflammation and reactive mesothelial changes. C. FALLOPIAN TUBE, LEFT, RESECTION: - Fallopian tube with focal, mild inflammation and fibrosis. - No tumor identified. D. UTERUS, CERVIX, RIGHT TUBE AND OVARY: - Cervix Nabothian cyst and squamous metaplasia. - Endometrium Proliferative. No hyperplasia or carcinoma. - Myometrium Leiomyomata. No malignancy. -Right ovary Poorly differentiated carcinoma, 4.8 cm. See oncology table and comment. -Right Fallopian tube Benign paratubal cyst. No endometriosis or malignancy. ONCOLOGY TABLE: OVARY or FALLOPIAN TUBE or PRIMARY PERITONEUM: Procedure: Bilateral f-oophorectomy, hysterectomy and omentectomy. Specimen Integrity: Intact Tumor Site: Left ovary and right ovary. See comment. Ovarian Surface Involvement: Not identified. Fallopian Tube Surface Involvement: Not identified. Tumor Size: Left  ovary: 18.6 x 16.0 x 7.2 cm. Right ovary: 4.8 x 3.2 x 3.2 cm. Histologic Type: Clear cell carcinoma. See comment. Histologic Grade: High-grade. Other Tissue/ Organ Involvement: Omental lymph node with metastatic carcinoma. Peritoneal/Ascitic Fluid: Pending. Treatment Effect: No known presurgical therapy. Pathologic Stage Classification (pTNM, AJCC 8th Edition): pT3a, pN1a. Representative Tumor Block: A2, A3, A4, A5 A6, A7 and A8. Comment(s): The left ovarian tumor is 18.6 cm in greatest dimension and has features of clear cell carcinoma. The right ovarian tumor is 4.8 cm in greatest dimension and is poorly differentiated with focal features consistent with clear cell carcinoma. The pattern of involvement suggests that the right ovarian tumor is likely metastatic from the larger left ovarian clear cell carcinoma. Sections of the omentum show a lymph node with metastatic carcinoma. Sections of the remainder of the omentum do not show involvement by carcinoma.   07/21/2019 Surgery   Procedure(s) Performed: Exploratory laparotomy with radical tumor debulking including total hysterectomy, bilateral salpingo-oophorectomy, infra-gastric omentectomy, and washings.   Surgeon: Jeral Pinch, MD    Operative Findings: On EUA, mobile uterus and ovarian mass, situated out of the pelvis, spanning 6-8 cm above the umbilicus.  On intra-abdominal entry, inflamed omentum adherent to much of the 16 cm left ovarian mass, adhesions easily lysed bluntly.  No nodularity of the ovarian mass however on delivery of the mass through the midline incision, there was Intra-Op rupture of one of the smaller cystic components with drainage of clear brown-tinged fluid.  Grossly normal-appearing left fallopian tube as well as right tube.  Right ovary mildly enlarged measuring approximately 4 cm and firm/nodular, suspicious for tumor involvement.  Uterus 8-10 cm with 4 cm anterior mid body fibroid.  Some very small volume miliary  disease along right pelvic sidewall, removed with uterine specimen.  Evidence of small diverticular disease.  Small bowel normal appearing although multiple loops of bowel adherent to each other, especially by mesentery, and an inflammatory manner.  An approximately 2 x 2 centimeter nodule suspicious for tumor implant was found in the omentum just inferior to the greater curvature of the stomach.  Additional small (less than 5 mm) implants noted on the stomach and posterior aspect of the left lobe of the liver.  Otherwise the liver surface and diaphragm were smooth.   08/03/2019 Cancer Staging   Staging form: Ovary, Fallopian Tube, and Primary Peritoneal Carcinoma, AJCC 8th Edition - Pathologic: Stage IIIA2 (pT3a, pN1, cM0) - Signed by Heath Lark, MD on 08/03/2019   08/12/2019 Procedure   Placement of single lumen port a cath via right internal jugular vein. The catheter tip lies at the cavo-atrial junction. A power injectable port a cath was placed and is ready for immediate use.   08/22/2019 -  Chemotherapy   The patient had carboplatin and taxol for chemotherapy treatment.     09/23/2019 Genetic Testing   Negative genetic testing:  No pathogenic variants detected on the [facility] TumorNext-HRD+CancerNext paired germline and somatic genetic test. The report date is 09/23/2019.  The TumorNext-HRD+CancerNext test offered by [facility] includes paired germline and tumor analyses of 11 genes associated with homologous recombination repair (ATM, BARD1, BRIP1, CHEK2, MRE11A, NBN, PALB2, RAD51C, RAD51D, BRCA1, BRCA2) plus germline analyses of 26 additional genes associated with hereditary cancer (APC, AXIN2, BMPR1A, CDH1, CDK4, CDKN2A, DICER1, HOXB13, EPCAM, GREM1, MLH1, MSH2, MSH3, MSH6, MUTYH, NF1, NTHL1, PMS2, POLD1, POLE, PTEN, RECQL, SMAD4, SMARCA4, STK11, and TP53).   11/14/2019 Tumor Marker   Patient's tumor was tested for the following markers: CA-125 Results of the tumor marker test revealed 10.2.    12/05/2019 Tumor Marker   Patient's tumor was tested for the following markers: CA-125 Results of the tumor marker test revealed 9.9   01/05/2020 Imaging   No specific findings of active malignancy. Interval resection of the pelvic mass. Subtle nodularity along the right side of the vaginal cuff merits surveillance.     01/05/2020 Tumor Marker   Patient's tumor was tested for the following markers: CA-125 Results of the tumor marker test revealed 9.7   02/17/2020 Tumor Marker   Patient's tumor was tested for the following markers: CA-125 Results of the tumor marker test revealed 9.6   04/05/2020 Imaging   1. There is some minimal stranding along the mesentery and omentum without overt omental caking or well-defined nodularity. Faint bandlike density along the anterior peritoneal reflection in the pelvis, slightly more notable than on prior. Overall the appearance is not considered specific for recurrence but given the slight increase in prominence from 01/05/2020, would encourage careful surveillance by tumor markers and imaging. 2. Mild lower lumbar degenerative facet arthropathy.   04/05/2020 Tumor Marker   Patient's tumor was tested for the following markers: CA-125 Results of the tumor marker test revealed 11   05/29/2020 Tumor Marker   Patient's tumor was tested for the following markers: CA-125. Results of the tumor marker test revealed 17.   07/04/2020 Tumor Marker   Patient's tumor was tested for the following markers: CA-125. Results of the tumor marker test revealed 20.1   07/16/2020 Imaging   Stable mild stranding throughout mesenteric and omental fat, without evidence of discrete masses or ascites.  No evidence of new or progressive disease.   08/29/2020 Tumor Marker   Patient's tumor was tested for the following markers: CA-125 Results of the tumor marker test revealed 32.7.   09/07/2020 Imaging   1. Interval progression of peritoneal disease within the abdomen and pelvis.  Tumor predominantly involves the serosal surface of the large and small bowel loops. No signs of bowel obstruction identified at this time. 2. No evidence for metastatic disease to the chest.   09/19/2020 Imaging   1. No acute findings identified within the abdomen or pelvis. No evidence for bowel obstruction. 2. Similar appearance of soft tissue thickening along the peritoneal reflections within the abdomen and pelvis compatible with known peritoneal disease. Recently characterized increased soft tissue along the surface of the jejunal bowel loops and tethering of pelvic small bowel loops is difficult to quantify (and compared with previous exam) due to lack of IV contrast material.     09/20/2020 -  Chemotherapy    Patient is on Treatment Plan: OVARIAN RECURRENT 3RD LINE CARBOPLATIN D1 / GEMCITABINE D1,8 (4/800) Q21D  Carboplatin was discontinued after 10/15/2020 due to allergic reaction      10/01/2020 Tumor Marker   Patient's tumor was tested for the following markers: CA-125. Results of the tumor marker test revealed 37.2.   10/15/2020 Tumor Marker   Patient's tumor was tested for the following markers: CA-125. Results of the tumor marker test revealed 48   10/22/2020 Tumor Marker   Patient's tumor was tested for the following markers: CA-125 Results of the tumor marker test revealed 28.5   11/05/2020 Tumor Marker   Patient's tumor was tested for the following markers: CA-125 Results of the tumor marker test revealed 18.9   12/17/2020 Imaging   1. No significant change in soft tissue thickening and nodularity in the low pelvis, with minimal thickening of the peritoneum throughout the abdomen. 2. Similar appearance of soft tissue thickening about the mid small bowel in the central, ventral abdomen. 3. Findings are consistent with stable peritoneal metastatic disease. 4. Status post hysterectomy and cholecystectomy.     REVIEW OF SYSTEMS:   Constitutional: Denies fevers, chills or  abnormal weight loss Eyes: Denies blurriness of vision Ears, nose, mouth, throat, and face: Denies mucositis or sore throat Respiratory: Denies cough, dyspnea or wheezes Cardiovascular: Denies palpitation, chest discomfort or lower extremity swelling Gastrointestinal:  Denies nausea, heartburn or change in bowel habits Skin: Denies abnormal skin rashes Lymphatics: Denies new lymphadenopathy or easy bruising Neurological:Denies numbness, tingling or new weaknesses Behavioral/Psych: Mood is stable, no new changes  All other systems were reviewed with the patient and are negative.  I have reviewed the past medical history, past surgical history, social history and family history with the patient and they are unchanged from previous note.  ALLERGIES:  is allergic to carboplatin.  MEDICATIONS:  Current Outpatient Medications  Medication Sig Dispense Refill  . acetaminophen (TYLENOL) 500 MG tablet Take 1,000 mg by mouth every 6 (six) hours as needed for moderate pain.     [place] [facility] amoxicillin (AMOXIL) 500 MG capsule Take 1 capsule (500 mg total) by mouth 2 (two) times daily. 14 capsule 0  . lidocaine-prilocaine (EMLA) cream APPLY TO THE AFFECTED AREA AS DIRECTED TO ACCESS PORT 30 g 11  . metFORMIN (GLUCOPHAGE) 500 MG tablet TAKE 1 TABLET BY MOUTH TWICE DAILY WITH A MEAL 60 tablet 1  . morphine (MSIR) 15 MG tablet Take 1 tablet (15 mg total) by mouth every 4 (four) hours as needed.  60 tablet 0  . Multiple Vitamin (MULTIVITAMIN WITH MINERALS) TABS tablet Take 1 tablet by mouth daily.    . ondansetron (ZOFRAN) 8 MG tablet TAKE 1 TABLET BY MOUTH EVERY 8 HOURS AS NEEDED FOR NAUSEA OR VOMITING 60 tablet 1  . pantoprazole (PROTONIX) 40 MG tablet TAKE 1 TABLET BY MOUTH DAILY 30 tablet 11  . prochlorperazine (COMPAZINE) 10 MG tablet Take 10 mg by mouth every 6 (six) hours as needed.     No current facility-administered medications for this visit.   Facility-Administered Medications Ordered in Other Visits   Medication Dose Route Frequency Provider Last Rate Last Admin  . gemcitabine (GEMZAR) 1,634 mg in sodium chloride 0.9 % 250 mL chemo infusion  800 mg/m2 (Treatment Plan Recorded) Intravenous Once Alvy Bimler, Tenae Graziosi, MD      . heparin lock flush 100 unit/mL  500 Units Intracatheter Once PRN Alvy Bimler, Koray Soter, MD      . sodium chloride flush (NS) 0.9 % injection 10 mL  10 mL Intracatheter PRN Alvy Bimler, Tanise Russman, MD        PHYSICAL EXAMINATION: ECOG PERFORMANCE STATUS: 1 - Symptomatic but completely ambulatory  Vitals:   12/18/20 1313  BP: 138/86  Pulse: 78  Resp: 18  Temp: 97.7 F (36.5 C)  SpO2: 100%   Filed Weights   12/18/20 1313  Weight: 202 lb 3.2 oz (91.7 kg)    GENERAL:alert, no distress and comfortable NEURO: alert & oriented x 3 with fluent speech, no focal motor/sensory deficits  LABORATORY DATA:  I have reviewed the data as listed    Component Value Date/Time   NA 143 12/18/2020 1343   K 4.4 12/18/2020 1343   CL 104 12/18/2020 1343   CO2 31 12/18/2020 1343   GLUCOSE 99 12/18/2020 1343   BUN 8 12/18/2020 1343   CREATININE 0.70 12/18/2020 1343   CALCIUM 9.5 12/18/2020 1343   PROT 7.1 12/18/2020 1343   ALBUMIN 3.7 12/18/2020 1343   AST 29 12/18/2020 1343   ALT 21 12/18/2020 1343   ALKPHOS 98 12/18/2020 1343   BILITOT 0.3 12/18/2020 1343   GFRNONAA >60 12/18/2020 1343   GFRAA >60 04/05/2020 1008    No results found for: SPEP, UPEP  Lab Results  Component Value Date   WBC 5.0 12/18/2020   NEUTROABS 2.8 12/18/2020   HGB 10.4 (L) 12/18/2020   HCT 33.3 (L) 12/18/2020   MCV 87.6 12/18/2020   PLT 209 12/18/2020      Chemistry      Component Value Date/Time   NA 143 12/18/2020 1343   K 4.4 12/18/2020 1343   CL 104 12/18/2020 1343   CO2 31 12/18/2020 1343   BUN 8 12/18/2020 1343   CREATININE 0.70 12/18/2020 1343      Component Value Date/Time   CALCIUM 9.5 12/18/2020 1343   ALKPHOS 98 12/18/2020 1343   AST 29 12/18/2020 1343   ALT 21 12/18/2020 1343   BILITOT 0.3  12/18/2020 1343       RADIOGRAPHIC STUDIES: I have reviewed multiple imaging studies with the patient I have personally reviewed the radiological images as listed and agreed with the findings in the report. CT Abdomen Pelvis Wo Contrast  Result Date: 12/17/2020 CLINICAL DATA:  Recurrent ovarian cancer, assess treatment response, second round of chemotherapy complete EXAM: CT ABDOMEN AND PELVIS WITHOUT CONTRAST TECHNIQUE: Multidetector CT imaging of the abdomen and pelvis was performed following the standard protocol without IV contrast. Oral enteric contrast was administered. COMPARISON:  09/19/2020 FINDINGS: Lower chest: No  acute abnormality. Hepatobiliary: No focal liver abnormality is seen. Status post cholecystectomy. Postoperative biliary dilatation. Pancreas: Unremarkable. No pancreatic ductal dilatation or surrounding inflammatory changes. Spleen: Normal in size without significant abnormality. Adrenals/Urinary Tract: Adrenal glands are unremarkable. Kidneys are normal, without renal calculi, solid lesion, or hydronephrosis. Bladder is unremarkable. Stomach/Bowel: Stomach is within normal limits. Appendix appears normal. Similar appearance of soft tissue thickening about the mid small bowel in the central, ventral abdomen (series 2, image 44) Vascular/Lymphatic: No significant vascular findings are present. No enlarged abdominal or pelvic lymph nodes. Reproductive: Status post hysterectomy. Other: No abdominal wall hernia or abnormality. No abdominopelvic ascites. No significant change in soft tissue thickening and nodularity in the low pelvis, with minimal thickening of the peritoneum throughout the abdomen (series 2, image 39, 31 74). Musculoskeletal: No acute or significant osseous findings. IMPRESSION: 1. No significant change in soft tissue thickening and nodularity in the low pelvis, with minimal thickening of the peritoneum throughout the abdomen. 2. Similar appearance of soft tissue  thickening about the mid small bowel in the central, ventral abdomen. 3. Findings are consistent with stable peritoneal metastatic disease. 4. Status post hysterectomy and cholecystectomy. Electronically Signed   By: Eddie Candle M.D.   On: 12/17/2020 15:32

## 2020-12-18 NOTE — Assessment & Plan Note (Signed)
Her abdominal pain has improved dramatically and is less She does not need pain medicine refill today

## 2020-12-19 ENCOUNTER — Ambulatory Visit: Payer: Medicaid Other | Admitting: Hematology and Oncology

## 2020-12-19 ENCOUNTER — Other Ambulatory Visit: Payer: Self-pay | Admitting: Hematology and Oncology

## 2020-12-19 LAB — CA 125: Cancer Antigen (CA) 125: 10.8 U/mL (ref 0.0–38.1)

## 2020-12-19 NOTE — Progress Notes (Signed)
[facility] Spiritual Care Note  Referred by infusion nurses for spiritual and emotional support. Sarah Newman had been thinking that she had reached the end of her chemo treatment and was prepared to celebrate...when she learned that chemo treatments would need to be ongoing in order to keep her cancer at [place]. This news came as a blow to her morale and life plans, necessitating the beginning of a long task of processing what this will mean for her.  Brought a "pick-me-up package" including a handmade prayer shawl, cosmetics, and other self-care items as a tangible sign of support and encouragement. Sarah Newman was very receptive to pastoral support and brainstorming about questions to ask of doctors and others, including how best to support her mental health through this significant shift. She plans to use the [employer] search engine to identify potential counselors and is pleased to have some agency in that decision-making process, especially in the wake of such loss of control.  We plan to follow up in ca two weeks, and she has my direct-dial number as well.   [place], [place], [facility] Pager [phone] Voicemail [phone]

## 2020-12-21 ENCOUNTER — Telehealth: Payer: Self-pay | Admitting: Hematology and Oncology

## 2020-12-21 NOTE — Telephone Encounter (Signed)
Called and spoke with pt, confirmed 5/24 los

## 2020-12-23 ENCOUNTER — Encounter: Payer: Self-pay | Admitting: Hematology and Oncology

## 2020-12-24 ENCOUNTER — Ambulatory Visit: Payer: Medicaid Other | Admitting: Hematology and Oncology

## 2020-12-25 ENCOUNTER — Inpatient Hospital Stay: Payer: Medicaid Other

## 2020-12-25 ENCOUNTER — Encounter: Payer: Self-pay | Admitting: Oncology

## 2020-12-25 ENCOUNTER — Other Ambulatory Visit: Payer: Self-pay

## 2020-12-25 ENCOUNTER — Other Ambulatory Visit: Payer: Self-pay | Admitting: Hematology and Oncology

## 2020-12-25 VITALS — BP 117/91 | HR 68 | Temp 98.4°F | Resp 18

## 2020-12-25 DIAGNOSIS — C562 Malignant neoplasm of left ovary: Secondary | ICD-10-CM

## 2020-12-25 DIAGNOSIS — Z5112 Encounter for antineoplastic immunotherapy: Secondary | ICD-10-CM | POA: Diagnosis not present

## 2020-12-25 LAB — CMP (CANCER CENTER ONLY)
ALT: 41 U/L (ref 0–44)
AST: 51 U/L — ABNORMAL HIGH (ref 15–41)
Albumin: 3.5 g/dL (ref 3.5–5.0)
Alkaline Phosphatase: 102 U/L (ref 38–126)
Anion gap: 11 (ref 5–15)
BUN: 6 mg/dL (ref 6–20)
CO2: 28 mmol/L (ref 22–32)
Calcium: 9.2 mg/dL (ref 8.9–10.3)
Chloride: 105 mmol/L (ref 98–111)
Creatinine: 0.62 mg/dL (ref 0.44–1.00)
GFR, Estimated: >60 mL/min (ref >60)
Glucose, Bld: 91 mg/dL (ref 70–99)
Potassium: 3.5 mmol/L (ref 3.5–5.1)
Sodium: 144 mmol/L (ref 135–145)
Total Bilirubin: <0.2 mg/dL — ABNORMAL LOW (ref 0.3–1.2)
Total Protein: 7 g/dL (ref 6.5–8.1)

## 2020-12-25 LAB — CBC WITH DIFFERENTIAL (CANCER CENTER ONLY)
Abs Immature Granulocytes: 0.01 10*3/uL (ref 0.00–0.07)
Basophils Absolute: 0 10*3/uL (ref 0.0–0.1)
Basophils Relative: 0 %
Eosinophils Absolute: 0 10*3/uL (ref 0.0–0.5)
Eosinophils Relative: 1 %
HCT: 28.5 % — ABNORMAL LOW (ref 36.0–46.0)
Hemoglobin: 9.3 g/dL — ABNORMAL LOW (ref 12.0–15.0)
Immature Granulocytes: 0 %
Lymphocytes Relative: 58 %
Lymphs Abs: 1.7 10*3/uL (ref 0.7–4.0)
MCH: 28 pg (ref 26.0–34.0)
MCHC: 32.6 g/dL (ref 30.0–36.0)
MCV: 85.8 fL (ref 80.0–100.0)
Monocytes Absolute: 0.2 10*3/uL (ref 0.1–1.0)
Monocytes Relative: 8 %
Neutro Abs: 1 10*3/uL — ABNORMAL LOW (ref 1.7–7.7)
Neutrophils Relative %: 33 %
Platelet Count: 208 10*3/uL (ref 150–400)
RBC: 3.32 MIL/uL — ABNORMAL LOW (ref 3.87–5.11)
RDW: 14.3 % (ref 11.5–15.5)
WBC Count: 3 10*3/uL — ABNORMAL LOW (ref 4.0–10.5)
nRBC: 0 % (ref 0.0–0.2)

## 2020-12-25 MED ORDER — SODIUM CHLORIDE 0.9% FLUSH
10.0000 mL | Freq: Once | INTRAVENOUS | Status: AC
  Administered 2020-12-25: 10 mL
  Filled 2020-12-25: qty 10

## 2020-12-25 MED ORDER — ACETAMINOPHEN 325 MG PO TABS
ORAL_TABLET | ORAL | Status: AC
  Filled 2020-12-25: qty 2

## 2020-12-25 MED ORDER — SODIUM CHLORIDE 0.9 % IV SOLN
10.0000 mg/kg | Freq: Once | INTRAVENOUS | Status: AC
  Administered 2020-12-25: 900 mg via INTRAVENOUS
  Filled 2020-12-25: qty 32

## 2020-12-25 MED ORDER — SODIUM CHLORIDE 0.9% FLUSH
10.0000 mL | INTRAVENOUS | Status: DC | PRN
  Administered 2020-12-25: 10 mL
  Filled 2020-12-25: qty 10

## 2020-12-25 MED ORDER — SODIUM CHLORIDE 0.9 % IV SOLN
800.0000 mg/m2 | Freq: Once | INTRAVENOUS | Status: AC
  Administered 2020-12-25: 1634 mg via INTRAVENOUS
  Filled 2020-12-25: qty 42.98

## 2020-12-25 MED ORDER — PROCHLORPERAZINE MALEATE 10 MG PO TABS
10.0000 mg | ORAL_TABLET | Freq: Once | ORAL | Status: AC
  Administered 2020-12-25: 10 mg via ORAL
  Filled 2020-12-25: qty 1

## 2020-12-25 MED ORDER — HEPARIN SOD (PORK) LOCK FLUSH 100 UNIT/ML IV SOLN
500.0000 [IU] | Freq: Once | INTRAVENOUS | Status: AC | PRN
  Administered 2020-12-25: 500 [IU]
  Filled 2020-12-25: qty 5

## 2020-12-25 MED ORDER — SODIUM CHLORIDE 0.9 % IV SOLN
Freq: Once | INTRAVENOUS | Status: AC
  Filled 2020-12-25: qty 250

## 2020-12-25 MED ORDER — ACETAMINOPHEN 325 MG PO TABS
650.0000 mg | ORAL_TABLET | Freq: Once | ORAL | Status: AC
Start: 2020-12-25 — End: 2020-12-25
  Administered 2020-12-25: 650 mg via ORAL

## 2020-12-25 NOTE — Progress Notes (Signed)
Per Dr. Elson Areas, ok to treat with 1.0.  Patient with complaint of headache 7/10. States she had "a couple of glasses of wine" last night and feels that the headache is due to this. Dr. Alvy Bimler made aware. Verbal order received for PO Tylenol. Patient instructed to refrain from alcohol 5-7 days prior to and after chemo. Patient verbalized understanding.

## 2020-12-25 NOTE — Patient Instructions (Signed)
Sarah Newman  Discharge Instructions: Thank you for choosing [facility] to provide your oncology and hematology care.   If you have a lab appointment with the [facility], please go directly to the [facility] and check in at the registration area.   Wear comfortable clothing and clothing appropriate for easy access to any Portacath or PICC line.   We strive to give you quality time with your provider. You may need to reschedule your appointment if you arrive late (15 or more minutes).  Arriving late affects you and other patients whose appointments are after yours.  Also, if you miss three or more appointments without notifying the office, you may be dismissed from the clinic at the provider's discretion.      For prescription refill requests, have your pharmacy contact our office and allow 72 hours for refills to be completed.    Today you received the following chemotherapy and/or immunotherapy agents: bevacizumab, gemcitabine.   To help prevent nausea and vomiting after your treatment, we encourage you to take your nausea medication as directed.  BELOW ARE SYMPTOMS THAT SHOULD BE REPORTED IMMEDIATELY: . *FEVER GREATER THAN 100.4 F (38 C) OR HIGHER . *CHILLS OR SWEATING . *NAUSEA AND VOMITING THAT IS NOT CONTROLLED WITH YOUR NAUSEA MEDICATION . *UNUSUAL SHORTNESS OF BREATH . *UNUSUAL BRUISING OR BLEEDING . *URINARY PROBLEMS (pain or burning when urinating, or frequent urination) . *BOWEL PROBLEMS (unusual diarrhea, constipation, pain near the anus) . TENDERNESS IN MOUTH AND THROAT WITH OR WITHOUT PRESENCE OF ULCERS (sore throat, sores in mouth, or a toothache) . UNUSUAL RASH, SWELLING OR PAIN  . UNUSUAL VAGINAL DISCHARGE OR ITCHING   Items with * indicate a potential emergency and should be followed up as soon as possible or go to the Emergency Department if any problems should occur.  Please show the CHEMOTHERAPY ALERT CARD or  IMMUNOTHERAPY ALERT CARD at check-in to the Emergency Department and triage nurse.  Should you have questions after your visit or need to cancel or reschedule your appointment, please contact [facility]  Dept: [phone]  and follow the prompts.  Office hours are 8:00 a.m. to 4:30 p.m. Monday - Friday. Please note that voicemails left after 4:00 p.m. may not be returned until the following business day.  We are closed weekends and major holidays. You have access to a nurse at all times for urgent questions. Please call the main number to the clinic Dept: [phone] and follow the prompts.   For any non-urgent questions, you may also contact your provider using MyChart. We now offer e-Visits for anyone 22 and older to request care online for non-urgent symptoms. For details visit [email or web address].   Also download the MyChart app! Go to the app store, search "MyChart", open the app, select [facility], and log in with your MyChart username and password.  Due to Covid, a mask is required upon entering the hospital/clinic. If you do not have a mask, one will be given to you upon arrival. For doctor visits, patients may have 1 support person aged 39 or older with them. For treatment visits, patients cannot have anyone with them due to current Covid guidelines and our immunocompromised population.   Bevacizumab injection What is this medicine? BEVACIZUMAB (be va SIZ yoo mab) is a monoclonal antibody. It is used to treat many types of cancer. This medicine may be used for other purposes; ask your health care provider or pharmacist if  you have questions. COMMON BRAND NAME(S): Avastin, MVASI, Zirabev What should I tell my health care provider before I take this medicine? They need to know if you have any of these conditions:  diabetes  heart disease  high blood pressure  history of coughing up blood  prior anthracycline chemotherapy (e.g., doxorubicin,  daunorubicin, epirubicin)  recent or ongoing radiation therapy  recent or planning to have surgery  stroke  an unusual or allergic reaction to bevacizumab, hamster proteins, mouse proteins, other medicines, foods, dyes, or preservatives  pregnant or trying to get pregnant  breast-feeding How should I use this medicine? This medicine is for infusion into a vein. It is given by a health care professional in a hospital or clinic setting. Talk to your pediatrician regarding the use of this medicine in children. Special care may be needed. Overdosage: If you think you have taken too much of this medicine contact a poison control center or emergency room at once. NOTE: This medicine is only for you. Do not share this medicine with others. What if I miss a dose? It is important not to miss your dose. Call your doctor or health care professional if you are unable to keep an appointment. What may interact with this medicine? Interactions are not expected. This list may not describe all possible interactions. Give your health care provider a list of all the medicines, herbs, non-prescription drugs, or dietary supplements you use. Also tell them if you smoke, drink alcohol, or use illegal drugs. Some items may interact with your medicine. What should I watch for while using this medicine? Your condition will be monitored carefully while you are receiving this medicine. You will need important blood work and urine testing done while you are taking this medicine. This medicine may increase your risk to bruise or bleed. Call your doctor or health care professional if you notice any unusual bleeding. Before having surgery, talk to your health care provider to make sure it is ok. This drug can increase the risk of poor healing of your surgical site or wound. You will need to stop this drug for 28 days before surgery. After surgery, wait at least 28 days before restarting this drug. Make sure the surgical  site or wound is healed enough before restarting this drug. Talk to your health care provider if questions. Do not become pregnant while taking this medicine or for 6 months after stopping it. Women should inform their doctor if they wish to become pregnant or think they might be pregnant. There is a potential for serious side effects to an unborn child. Talk to your health care professional or pharmacist for more information. Do not breast-feed an infant while taking this medicine and for 6 months after the last dose. This medicine has caused ovarian failure in some women. This medicine may interfere with the ability to have a child. You should talk to your doctor or health care professional if you are concerned about your fertility. What side effects may I notice from receiving this medicine? Side effects that you should report to your doctor or health care professional as soon as possible:  allergic reactions like skin rash, itching or hives, swelling of the face, lips, or tongue  chest pain or chest tightness  chills  coughing up blood  high fever  seizures  severe constipation  signs and symptoms of bleeding such as bloody or black, tarry stools; red or dark-brown urine; spitting up blood or brown material that looks like coffee  grounds; red spots on the skin; unusual bruising or bleeding from the eye, gums, or nose  signs and symptoms of a blood clot such as breathing problems; chest pain; severe, sudden headache; pain, swelling, warmth in the leg  signs and symptoms of a stroke like changes in vision; confusion; trouble speaking or understanding; severe headaches; sudden numbness or weakness of the face, arm or leg; trouble walking; dizziness; loss of balance or coordination  stomach pain  sweating  swelling of legs or ankles  vomiting  weight gain Side effects that usually do not require medical attention (report to your doctor or health care professional if they continue  or are bothersome):  back pain  changes in taste  decreased appetite  dry skin  nausea  tiredness This list may not describe all possible side effects. Call your doctor for medical advice about side effects. You may report side effects to FDA at 1-800-FDA-1088. Where should I keep my medicine? This drug is given in a hospital or clinic and will not be stored at home. NOTE: This sheet is a summary. It may not cover all possible information. If you have questions about this medicine, talk to your doctor, pharmacist, or health care provider.  2021 Elsevier/Gold Standard (2019-05-18 10:50:46)  Gemcitabine injection What is this medicine? GEMCITABINE (jem SYE ta been) is a chemotherapy drug. This medicine is used to treat many types of cancer like breast cancer, lung cancer, pancreatic cancer, and ovarian cancer. This medicine may be used for other purposes; ask your health care provider or pharmacist if you have questions. COMMON BRAND NAME(S): Gemzar, Infugem What should I tell my health care provider before I take this medicine? They need to know if you have any of these conditions:  blood disorders  infection  kidney disease  liver disease  lung or breathing disease, like asthma  recent or ongoing radiation therapy  an unusual or allergic reaction to gemcitabine, other chemotherapy, other medicines, foods, dyes, or preservatives  pregnant or trying to get pregnant  breast-feeding How should I use this medicine? This drug is given as an infusion into a vein. It is administered in a hospital or clinic by a specially trained health care professional. Talk to your pediatrician regarding the use of this medicine in children. Special care may be needed. Overdosage: If you think you have taken too much of this medicine contact a poison control center or emergency room at once. NOTE: This medicine is only for you. Do not share this medicine with others. What if I miss a  dose? It is important not to miss your dose. Call your doctor or health care professional if you are unable to keep an appointment. What may interact with this medicine?  medicines to increase blood counts like filgrastim, pegfilgrastim, sargramostim  some other chemotherapy drugs like cisplatin  vaccines Talk to your doctor or health care professional before taking any of these medicines:  acetaminophen  aspirin  ibuprofen  ketoprofen  naproxen This list may not describe all possible interactions. Give your health care provider a list of all the medicines, herbs, non-prescription drugs, or dietary supplements you use. Also tell them if you smoke, drink alcohol, or use illegal drugs. Some items may interact with your medicine. What should I watch for while using this medicine? Visit your doctor for checks on your progress. This drug may make you feel generally unwell. This is not uncommon, as chemotherapy can affect healthy cells as well as cancer cells. Report any  side effects. Continue your course of treatment even though you feel ill unless your doctor tells you to stop. In some cases, you may be given additional medicines to help with side effects. Follow all directions for their use. Call your doctor or health care professional for advice if you get a fever, chills or sore throat, or other symptoms of a cold or flu. Do not treat yourself. This drug decreases your body's ability to fight infections. Try to avoid being around people who are sick. This medicine may increase your risk to bruise or bleed. Call your doctor or health care professional if you notice any unusual bleeding. Be careful brushing and flossing your teeth or using a toothpick because you may get an infection or bleed more easily. If you have any dental work done, tell your dentist you are receiving this medicine. Avoid taking products that contain aspirin, acetaminophen, ibuprofen, naproxen, or ketoprofen unless  instructed by your doctor. These medicines may hide a fever. Do not become pregnant while taking this medicine or for 6 months after stopping it. Women should inform their doctor if they wish to become pregnant or think they might be pregnant. Men should not father a child while taking this medicine and for 3 months after stopping it. There is a potential for serious side effects to an unborn child. Talk to your health care professional or pharmacist for more information. Do not breast-feed an infant while taking this medicine or for at least 1 week after stopping it. Men should inform their doctors if they wish to father a child. This medicine may lower sperm counts. Talk with your doctor or health care professional if you are concerned about your fertility. What side effects may I notice from receiving this medicine? Side effects that you should report to your doctor or health care professional as soon as possible:  allergic reactions like skin rash, itching or hives, swelling of the face, lips, or tongue  breathing problems  pain, redness, or irritation at site where injected  signs and symptoms of a dangerous change in heartbeat or heart rhythm like chest pain; dizziness; fast or irregular heartbeat; palpitations; feeling faint or lightheaded, falls; breathing problems  signs of decreased platelets or bleeding - bruising, pinpoint red spots on the skin, black, tarry stools, blood in the urine  signs of decreased red blood cells - unusually weak or tired, feeling faint or lightheaded, falls  signs of infection - fever or chills, cough, sore throat, pain or difficulty passing urine  signs and symptoms of kidney injury like trouble passing urine or change in the amount of urine  signs and symptoms of liver injury like dark yellow or brown urine; general ill feeling or flu-like symptoms; light-colored stools; loss of appetite; nausea; right upper belly pain; unusually weak or tired; yellowing  of the eyes or skin  swelling of ankles, feet, hands Side effects that usually do not require medical attention (report to your doctor or health care professional if they continue or are bothersome):  constipation  diarrhea  hair loss  loss of appetite  nausea  rash  vomiting This list may not describe all possible side effects. Call your doctor for medical advice about side effects. You may report side effects to FDA at 1-800-FDA-1088. Where should I keep my medicine? This drug is given in a hospital or clinic and will not be stored at home. NOTE: This sheet is a summary. It may not cover all possible information. If you have  questions about this medicine, talk to your doctor, pharmacist, or health care provider.  2021 Elsevier/Gold Standard (2017-10-14 18:06:11)

## 2021-01-02 DIAGNOSIS — G8929 Other chronic pain: Secondary | ICD-10-CM

## 2021-01-02 DIAGNOSIS — R109 Unspecified abdominal pain: Secondary | ICD-10-CM

## 2021-01-02 HISTORY — DX: Unspecified abdominal pain: R10.9

## 2021-01-02 HISTORY — DX: Other chronic pain: G89.29

## 2021-01-04 ENCOUNTER — Telehealth: Payer: Self-pay

## 2021-01-04 NOTE — Telephone Encounter (Signed)
She called and left a message asking about paper work to go back to work.  Called back and told her Dr. Alvy Bimler will fill out paper work on next visit on 6/10. She is concerned about her financial situation due to not working. Message sent to social worker to call her. She is asking for possible PCP that she could call for appt. Given phone # for community wellness to try calling. She verbalized understanding.

## 2021-01-09 ENCOUNTER — Encounter: Payer: Self-pay | Admitting: Hematology and Oncology

## 2021-01-11 ENCOUNTER — Inpatient Hospital Stay: Payer: Medicaid Other | Attending: Gynecologic Oncology

## 2021-01-11 ENCOUNTER — Ambulatory Visit (HOSPITAL_COMMUNITY)
Admission: RE | Admit: 2021-01-11 | Discharge: 2021-01-11 | Disposition: A | Discharge status: Home / Self Care | Payer: Medicaid Other | Source: Ambulatory Visit | Attending: Hematology and Oncology | Admitting: Hematology and Oncology

## 2021-01-11 ENCOUNTER — Encounter: Payer: Self-pay | Admitting: General Practice

## 2021-01-11 ENCOUNTER — Emergency Department (HOSPITAL_COMMUNITY): Payer: Medicaid Other

## 2021-01-11 ENCOUNTER — Emergency Department (HOSPITAL_COMMUNITY)
Admission: EM | Admit: 2021-01-11 | Discharge: 2021-01-11 | Disposition: A | Discharge status: Home / Self Care | Payer: Medicaid Other | Attending: Emergency Medicine | Admitting: Emergency Medicine

## 2021-01-11 ENCOUNTER — Other Ambulatory Visit: Payer: Self-pay

## 2021-01-11 ENCOUNTER — Encounter: Payer: Self-pay | Admitting: Hematology and Oncology

## 2021-01-11 ENCOUNTER — Telehealth: Payer: Self-pay | Admitting: Hematology and Oncology

## 2021-01-11 ENCOUNTER — Encounter (HOSPITAL_COMMUNITY): Payer: Self-pay | Admitting: Emergency Medicine

## 2021-01-11 ENCOUNTER — Inpatient Hospital Stay: Payer: Medicaid Other

## 2021-01-11 ENCOUNTER — Inpatient Hospital Stay (HOSPITAL_BASED_OUTPATIENT_CLINIC_OR_DEPARTMENT_OTHER): Payer: Medicaid Other | Admitting: Hematology and Oncology

## 2021-01-11 VITALS — BP 152/117 | HR 82 | Temp 97.7°F | Resp 18 | Ht 64.0 in | Wt 196.0 lb

## 2021-01-11 DIAGNOSIS — R112 Nausea with vomiting, unspecified: Secondary | ICD-10-CM | POA: Insufficient documentation

## 2021-01-11 DIAGNOSIS — C562 Malignant neoplasm of left ovary: Secondary | ICD-10-CM | POA: Diagnosis not present

## 2021-01-11 DIAGNOSIS — Z79899 Other long term (current) drug therapy: Secondary | ICD-10-CM | POA: Diagnosis not present

## 2021-01-11 DIAGNOSIS — R109 Unspecified abdominal pain: Secondary | ICD-10-CM | POA: Diagnosis not present

## 2021-01-11 DIAGNOSIS — R5381 Other malaise: Secondary | ICD-10-CM | POA: Insufficient documentation

## 2021-01-11 DIAGNOSIS — D6481 Anemia due to antineoplastic chemotherapy: Secondary | ICD-10-CM

## 2021-01-11 DIAGNOSIS — Z5111 Encounter for antineoplastic chemotherapy: Secondary | ICD-10-CM | POA: Diagnosis present

## 2021-01-11 DIAGNOSIS — Z8616 Personal history of COVID-19: Secondary | ICD-10-CM | POA: Insufficient documentation

## 2021-01-11 DIAGNOSIS — R1013 Epigastric pain: Secondary | ICD-10-CM

## 2021-01-11 DIAGNOSIS — J45909 Unspecified asthma, uncomplicated: Secondary | ICD-10-CM | POA: Insufficient documentation

## 2021-01-11 DIAGNOSIS — Z8543 Personal history of malignant neoplasm of ovary: Secondary | ICD-10-CM | POA: Diagnosis not present

## 2021-01-11 DIAGNOSIS — K56609 Unspecified intestinal obstruction, unspecified as to partial versus complete obstruction: Secondary | ICD-10-CM | POA: Diagnosis not present

## 2021-01-11 DIAGNOSIS — K56600 Partial intestinal obstruction, unspecified as to cause: Secondary | ICD-10-CM | POA: Insufficient documentation

## 2021-01-11 DIAGNOSIS — T451X5A Adverse effect of antineoplastic and immunosuppressive drugs, initial encounter: Secondary | ICD-10-CM | POA: Diagnosis not present

## 2021-01-11 DIAGNOSIS — C786 Secondary malignant neoplasm of retroperitoneum and peritoneum: Secondary | ICD-10-CM | POA: Diagnosis not present

## 2021-01-11 DIAGNOSIS — R1010 Upper abdominal pain, unspecified: Secondary | ICD-10-CM | POA: Diagnosis present

## 2021-01-11 DIAGNOSIS — R110 Nausea: Secondary | ICD-10-CM | POA: Insufficient documentation

## 2021-01-11 DIAGNOSIS — Z5112 Encounter for antineoplastic immunotherapy: Secondary | ICD-10-CM | POA: Diagnosis present

## 2021-01-11 LAB — CMP (CANCER CENTER ONLY)
ALT: 14 U/L (ref 0–44)
AST: 18 U/L (ref 15–41)
Albumin: 3.9 g/dL (ref 3.5–5.0)
Alkaline Phosphatase: 86 U/L (ref 38–126)
Anion gap: 10 (ref 5–15)
BUN: 9 mg/dL (ref 6–20)
CO2: 26 mmol/L (ref 22–32)
Calcium: 9.6 mg/dL (ref 8.9–10.3)
Chloride: 105 mmol/L (ref 98–111)
Creatinine: 0.6 mg/dL (ref 0.44–1.00)
GFR, Estimated: >60 mL/min (ref >60)
Glucose, Bld: 114 mg/dL — ABNORMAL HIGH (ref 70–99)
Potassium: 3.5 mmol/L (ref 3.5–5.1)
Sodium: 141 mmol/L (ref 135–145)
Total Bilirubin: 0.4 mg/dL (ref 0.3–1.2)
Total Protein: 7.6 g/dL (ref 6.5–8.1)

## 2021-01-11 LAB — CBC WITH DIFFERENTIAL (CANCER CENTER ONLY)
Abs Immature Granulocytes: 0.02 10*3/uL (ref 0.00–0.07)
Basophils Absolute: 0 10*3/uL (ref 0.0–0.1)
Basophils Relative: 0 %
Eosinophils Absolute: 0.2 10*3/uL (ref 0.0–0.5)
Eosinophils Relative: 2 %
HCT: 34.7 % — ABNORMAL LOW (ref 36.0–46.0)
Hemoglobin: 11 g/dL — ABNORMAL LOW (ref 12.0–15.0)
Immature Granulocytes: 0 %
Lymphocytes Relative: 19 %
Lymphs Abs: 1.7 10*3/uL (ref 0.7–4.0)
MCH: 27.6 pg (ref 26.0–34.0)
MCHC: 31.7 g/dL (ref 30.0–36.0)
MCV: 87.2 fL (ref 80.0–100.0)
Monocytes Absolute: 0.5 10*3/uL (ref 0.1–1.0)
Monocytes Relative: 5 %
Neutro Abs: 6.4 10*3/uL (ref 1.7–7.7)
Neutrophils Relative %: 74 %
Platelet Count: 706 10*3/uL — ABNORMAL HIGH (ref 150–400)
RBC: 3.98 MIL/uL (ref 3.87–5.11)
RDW: 15.9 % — ABNORMAL HIGH (ref 11.5–15.5)
WBC Count: 8.8 10*3/uL (ref 4.0–10.5)
nRBC: 0 % (ref 0.0–0.2)

## 2021-01-11 MED ORDER — SODIUM CHLORIDE 0.9% FLUSH
3.0000 mL | Freq: Once | INTRAVENOUS | Status: DC | PRN
  Filled 2021-01-11: qty 10

## 2021-01-11 MED ORDER — HYDROMORPHONE HCL 1 MG/ML IJ SOLN
1.0000 mg | INTRAMUSCULAR | Status: DC | PRN
  Administered 2021-01-11 (×2): 1 mg via INTRAVENOUS
  Filled 2021-01-11 (×3): qty 1

## 2021-01-11 MED ORDER — HEPARIN SOD (PORK) LOCK FLUSH 100 UNIT/ML IV SOLN
250.0000 [IU] | Freq: Once | INTRAVENOUS | Status: DC | PRN
  Filled 2021-01-11: qty 5

## 2021-01-11 MED ORDER — HEPARIN SOD (PORK) LOCK FLUSH 100 UNIT/ML IV SOLN
500.0000 [IU] | Freq: Once | INTRAVENOUS | Status: AC | PRN
  Administered 2021-01-11: 500 [IU]
  Filled 2021-01-11: qty 5

## 2021-01-11 MED ORDER — HEPARIN SOD (PORK) LOCK FLUSH 100 UNIT/ML IV SOLN
500.0000 [IU] | Freq: Once | INTRAVENOUS | Status: AC
  Administered 2021-01-11: 500 [IU]
  Filled 2021-01-11: qty 5

## 2021-01-11 MED ORDER — ALTEPLASE 2 MG IJ SOLR
2.0000 mg | Freq: Once | INTRAMUSCULAR | Status: DC | PRN
  Filled 2021-01-11: qty 2

## 2021-01-11 MED ORDER — SODIUM CHLORIDE 0.9 % IV SOLN
Freq: Once | INTRAVENOUS | Status: AC
Start: 2021-01-11 — End: 2021-01-11
  Filled 2021-01-11: qty 250

## 2021-01-11 MED ORDER — SODIUM CHLORIDE 0.9% FLUSH
10.0000 mL | Freq: Once | INTRAVENOUS | Status: AC
  Administered 2021-01-11: 10 mL
  Filled 2021-01-11: qty 10

## 2021-01-11 MED ORDER — SODIUM CHLORIDE 0.9% FLUSH
10.0000 mL | Freq: Once | INTRAVENOUS | Status: DC | PRN
  Filled 2021-01-11: qty 10

## 2021-01-11 MED ORDER — ONDANSETRON HCL 4 MG/2ML IJ SOLN
4.0000 mg | INTRAMUSCULAR | Status: DC | PRN
  Administered 2021-01-11 (×2): 4 mg via INTRAVENOUS
  Filled 2021-01-11 (×2): qty 2

## 2021-01-11 MED ORDER — ONDANSETRON 4 MG PO TBDP: 4.0000 mg | ORAL_TABLET | ORAL | 0 refills | Status: DC | PRN

## 2021-01-11 MED ORDER — LACTATED RINGERS IV SOLN: INTRAVENOUS | Status: DC

## 2021-01-11 MED ORDER — OXYCODONE HCL 5 MG PO CAPS: 5.0000 mg | ORAL_CAPSULE | ORAL | 0 refills | Status: DC | PRN

## 2021-01-11 MED ORDER — IOHEXOL 300 MG/ML  SOLN
100.0000 mL | Freq: Once | INTRAMUSCULAR | Status: AC | PRN
  Administered 2021-01-11: 100 mL via INTRAVENOUS

## 2021-01-11 MED ORDER — HEPARIN SOD (PORK) LOCK FLUSH 100 UNIT/ML IV SOLN
500.0000 [IU] | Freq: Once | INTRAVENOUS | Status: DC
  Filled 2021-01-11: qty 5

## 2021-01-11 MED ORDER — SODIUM CHLORIDE (PF) 0.9 % IJ SOLN
INTRAMUSCULAR | Status: AC
  Filled 2021-01-11: qty 50

## 2021-01-11 MED ORDER — IOHEXOL 9 MG/ML PO SOLN
ORAL | Status: AC
  Administered 2021-01-11: 500 mL
  Filled 2021-01-11: qty 500

## 2021-01-11 NOTE — Telephone Encounter (Signed)
I spoke with Dr. Berline Lopes who agrees with admission and conservative approach with NG tube decompression to see if her bowel obstruction will resolve I spoke with hospitalist service, Dr.Ogbata for direct admission.  He suggested the patient be directed to the ER for admission. I spoke with the ER charge nurse and was told there is no possibility for a bit directly from the cancer center to the ER due to the number of patients waiting to be seen Went back to the infusion room to talk to the patient about options.  We have given her approximately 700 cc of normal saline.  She continues to have abdominal cramping but no nausea.  The patient will go to the emergency room for admission.

## 2021-01-11 NOTE — ED Provider Notes (Addendum)
[facility] DEPT Provider Note   CSN: [id number] Arrival date & time: 01/11/21  1457     History No chief complaint on file.   Sarah Newman is a 45 y.o. female.  HPI Patient is sent from cancer center for small bowel obstruction.  Patient reports he started getting a lot of pain yesterday afternoon in her upper abdomen.  She reports she vomited multiple times overnight.  She reports the vomitus was oranges in appearance.  She reports last bowel movement was about 3 days ago.  She reports she is not passing any gas.  No fevers or chills.  No shortness of breath or chest pain.    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   Constipation 09/20/2020   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/19/2020   Cancer associated pain 09/14/2020   Lab test positive for detection of COVID-19 virus 09/10/2020   Hypokalemia 08/30/2020   Obesity, Class II, BMI 35-39.9 07/17/2020   Anemia, chronic disease 05/29/2020   UTI (urinary tract infection) 01/09/2020   Dysuria 01/06/2020   Anemia due to antineoplastic chemotherapy 12/05/2019   Gastritis 12/05/2019   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/2019   Weight gain 10/25/2019   Vaginal dryness 10/24/2019   Drug-induced hyperglycemia 10/03/2019   Genetic testing 09/23/2019   Bone pain 09/13/2019   Restless leg 09/13/2019   Elevated liver enzymes 09/13/2019   Family history of ovarian cancer     Family history of prostate cancer    Family history of colon cancer    Family history of breast cancer    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[place])    Pelvic mass in female 07/11/2019   Obesity 07/11/2019   Cholecystitis 02/16/2015    Past Surgical History:  Procedure Laterality Date   CHOLECYSTECTOMY N/A 02/16/2015   Procedure: LAPAROSCOPIC CHOLECYSTECTOMY WITH INTRAOPERATIVE CHOLANGIOGRAM;  Surgeon: Johnathan Hausen, MD;  Location: [facility];  Service: General;  Laterality: N/A;   IR IMAGING GUIDED PORT INSERTION  08/12/2019   SALPINGOOPHORECTOMY N/A 07/21/2019   Procedure: EXPLORATORY LAPAROTOMY, TOTAL ABDOMINAL HYSTERECTOMY, BILATERAL SALPINGO OOPHORECTOMY, OMENTECTOMY;  Surgeon: Lafonda Mosses, MD;  Location: [facility];  Service: Gynecology;  Laterality: N/A;   UPPER GASTROINTESTINAL ENDOSCOPY       OB History     Gravida  3   Para  3   Term      Preterm      AB      Living  2      SAB      IAB      Ectopic      Multiple      Live Births  2           Family History  Problem Relation Age of Onset   Colon cancer Father 25   Prostate cancer Father 71   Colon cancer Paternal Aunt  dx. early 74s   Ovarian cancer Paternal Grandmother 75   Breast cancer Cousin    Endometriosis Mother    Cancer Paternal Uncle        unknown type, dx. early 43s   Cancer Paternal Uncle 66       unknown type   Esophageal cancer Neg Hx     Social History   Tobacco Use   Smoking status: Never   Smokeless tobacco: Never  Vaping Use   Vaping Use: Never used  Substance Use Topics   Alcohol use: Yes    Comment: Social drinker   Drug use: No    Home Medications Prior to Admission medications   Medication Sig Start Date End Date Taking? Authorizing Provider  ondansetron (ZOFRAN ODT) 4 MG disintegrating tablet Take 1 tablet (4 mg total) by mouth every 4 (four) hours as needed for nausea or vomiting. 01/11/21  Yes Avana Kreiser, Jeannie Done, MD  oxycodone (OXY-IR) 5 MG capsule Take  1 capsule (5 mg total) by mouth every 4 (four) hours as needed. 01/11/21  Yes Charlesetta Shanks, MD  acetaminophen (TYLENOL) 500 MG tablet Take 1,000 mg by mouth every 6 (six) hours as needed for moderate pain.     [provider]  lidocaine-prilocaine (EMLA) cream APPLY TO THE AFFECTED AREA AS DIRECTED TO ACCESS PORT 10/22/20 10/22/21  Heath Lark, MD  metFORMIN (GLUCOPHAGE) 500 MG tablet TAKE 1 TABLET BY MOUTH TWICE DAILY WITH A MEAL 12/03/20 12/03/21  Heath Lark, MD  Multiple Vitamin (MULTIVITAMIN WITH MINERALS) TABS tablet Take 1 tablet by mouth daily.    [provider]  ondansetron (ZOFRAN) 8 MG tablet TAKE 1 TABLET BY MOUTH EVERY 8 HOURS AS NEEDED FOR NAUSEA OR VOMITING 10/22/20 10/22/21  Heath Lark, MD  pantoprazole (PROTONIX) 40 MG tablet TAKE 1 TABLET BY MOUTH DAILY 04/06/20 04/06/21  Heath Lark, MD  prochlorperazine (COMPAZINE) 10 MG tablet Take 10 mg by mouth every 6 (six) hours as needed.    [provider]  dicyclomine (BENTYL) 10 MG capsule Take 1 capsule (10 mg total) by mouth 3 (three) times daily before meals. 06/19/20 08/29/20  Ladene Artist, MD  famotidine (PEPCID) 20 MG tablet Take 1 tablet (20 mg total) by mouth 2 (two) times daily. 08/29/20 10/22/20  Heath Lark, MD  sucralfate (CARAFATE) 1 g tablet Take 1 tablet (1 g total) by mouth 3 (three) times daily. 08/29/20 10/01/20  Heath Lark, MD    Allergies    Carboplatin  Review of Systems   Review of Systems 10 systems reviewed and negative except as per HPI Physical Exam Updated Vital Signs BP (!) 128/97 (BP Location: Left Arm)   Pulse 75   Temp 98 F (36.7 C) (Oral)   Resp 18   LMP 07/05/2019   SpO2 100%   Physical Exam Constitutional:      General: She is not in acute distress.    Appearance: Normal appearance. She is well-developed.  HENT:     Head: Normocephalic and atraumatic.     Mouth/Throat:     Pharynx: Oropharynx is clear.  Eyes:     Extraocular Movements: Extraocular movements  intact.  Cardiovascular:     Rate and Rhythm: Normal rate and regular rhythm.     Heart sounds: Normal heart sounds.  Pulmonary:     Effort: Pulmonary effort is normal.     Breath sounds: Normal breath sounds.  Abdominal:     General: Bowel sounds are normal. There is no distension.  Palpations: Abdomen is soft.     Tenderness: There is abdominal tenderness.     Comments: Epigastric tenderness to palpation.  No guarding.  No significant distention peer  Musculoskeletal:        General: No swelling or tenderness.     Cervical back: Neck supple.     Right lower leg: No edema.     Left lower leg: No edema.  Skin:    General: Skin is warm and dry.  Neurological:     General: No focal deficit present.     Mental Status: She is alert and oriented to person, place, and time.     GCS: GCS eye subscore is 4. GCS verbal subscore is 5. GCS motor subscore is 6.    ED Results / Procedures / Treatments   Labs (all labs ordered are listed, but only abnormal results are displayed) Labs Reviewed - No data to display  EKG None  Radiology CT Abdomen Pelvis W Contrast  Result Date: 01/11/2021 CLINICAL DATA:  Possible small bowel obstruction, history of ovarian carcinoma EXAM: CT ABDOMEN AND PELVIS WITH CONTRAST TECHNIQUE: Multidetector CT imaging of the abdomen and pelvis was performed using the standard protocol following bolus administration of intravenous contrast. CONTRAST:  153mL OMNIPAQUE IOHEXOL 300 MG/ML  SOLN COMPARISON:  12/17/2020 CT, plain film from earlier in the same day. FINDINGS: Lower chest: No acute abnormality. Hepatobiliary: Fatty infiltration of the liver is noted. The gallbladder has been surgically removed. Biliary duct is within normal limits. Pancreas: Unremarkable. No pancreatic ductal dilatation or surrounding inflammatory changes. Spleen: Normal in size without focal abnormality. Adrenals/Urinary Tract: Adrenal glands are unremarkable. Kidneys demonstrate a normal  enhancement pattern bilaterally. No renal calculi or obstructive changes are noted. Normal excretion is noted on delayed images. The bladder is partially distended. Stomach/Bowel: No obstructive or inflammatory changes of the colon are seen. The appendix is within normal limits. Small bowel demonstrates a few mildly prominent loops in the mid abdomen although no definitive transition zone is identified. The distal small bowel appears within normal limits. Stomach is within normal limits. Vascular/Lymphatic: No significant vascular findings are present. No enlarged abdominal or pelvic lymph nodes. Reproductive: Status post hysterectomy. No adnexal masses. Other: Minimal free fluid is noted within the pelvis. Some areas of peritoneal thickening are again seen and stable. No discrete peritoneal mass is seen. Musculoskeletal: No acute bony abnormality is noted. Degenerative changes of lumbar spine are seen. IMPRESSION: Mildly prominent proximal loops of small bowel although no true transition zone is seen in the distal small bowel appears within normal limits. These changes could represent early partial small bowel obstruction. The overall appearance however is similar to that seen on prior CT from May of 2022. Mild peritoneal thickening similar to that seen on the prior exam. Fatty liver. No other focal abnormality is noted. Electronically Signed   By: Inez Catalina M.D.   On: 01/11/2021 21:15   DG Abd 2 Views  Result Date: 01/11/2021 CLINICAL DATA:  Ovarian cancer.  Rule out small bowel obstruction. EXAM: ABDOMEN - 2 VIEW COMPARISON:  12/17/2020 FINDINGS: Since the previous exam there is been interval decrease in colonic gas with central small bowel dilatation and air-fluid levels. The small bowel loops measure up to 4.4 cm. Cholecystectomy. IMPRESSION: Findings concerning for small bowel obstruction. Electronically Signed   By: Kerby Moors M.D.   On: 01/11/2021 13:16    Procedures Procedures   Medications  Ordered in ED Medications  lactated ringers infusion ( Intravenous  New Bag/Given 01/11/21 1637)  HYDROmorphone (DILAUDID) injection 1 mg (1 mg Intravenous Given 01/11/21 2137)  ondansetron (ZOFRAN) injection 4 mg (4 mg Intravenous Given 01/11/21 2142)  iohexol (OMNIPAQUE) 300 MG/ML solution 100 mL (100 mLs Intravenous Contrast Given 01/11/21 2045)  sodium chloride (PF) 0.9 % injection (  Given 01/11/21 1849)  iohexol (OMNIPAQUE) 9 MG/ML oral solution (500 mLs  Contrast Given 01/11/21 1805)  heparin lock flush 100 unit/mL (500 Units Intracatheter Given 01/11/21 2144)    ED Course  I have reviewed the triage vital signs and the nursing notes.  Pertinent labs & imaging results that were available during my care of the patient were reviewed by me and considered in my medical decision making (see chart for details).    MDM Rules/Calculators/A&P                         Patient presents as outlined.  She has history of metastatic ovarian cancer.  She had developed vomiting starting yesterday.  She was seen by Dr. Alvy Bimler today.  CT scan does not show obvious obstruction.  It appears consistent with prior scans.  Some slight dilated loops of small bowel without evidence of obstruction.  Patient has not had ongoing vomiting.  She has tolerated liquids.  At this time with CT not showing acute obstruction and patient clinically well in appearance with stable labs from earlier today, will trial 12 to 24 hours of outpatient management on clear liquids with pain medications and nausea medications.  Careful return precautions reviewed.  Final Clinical Impression(s) / ED Diagnoses Final diagnoses:  Epigastric pain    Rx / DC Orders ED Discharge Orders          Ordered    oxycodone (OXY-IR) 5 MG capsule  Every 4 hours PRN        01/11/21 2236    ondansetron (ZOFRAN ODT) 4 MG disintegrating tablet  Every 4 hours PRN        01/11/21 2236             Charlesetta Shanks, MD 01/11/21 2246    Charlesetta Shanks, MD 01/11/21 2249

## 2021-01-11 NOTE — Assessment & Plan Note (Signed)
The patient wants to go back to work Initially, the purpose of today's visit is to sign some disability paperwork to release her back to work However, given her medical situation, we will put that on hold

## 2021-01-11 NOTE — Patient Instructions (Signed)

## 2021-01-11 NOTE — Progress Notes (Signed)
Gyn Oncology note  I went to see the patient in the emergency department. We discussed recent events with last bowel movement two or three days ago and sudden abdominal pain as well as multiple episodes of emesis starting yesterday. She otherwise have been feeling very well recently. I discussed x-ray findings concerning for small bowel obstruction. CT scan has been ordered but not completed. I suspect that obstruction is due to m esenteric disease or carcinomatosis. Spoke previously with Dr. Alvy Bimler regarding plan for conservative management initially. I reviewed with the patient that this will mean IV fluids, NPO, and NG tube.   I am available over the weekend for any questions or needs. I will see the patient on Monday.  Valarie Cones MD

## 2021-01-11 NOTE — Assessment & Plan Note (Signed)
Unfortunately, she has early signs of small bowel obstruction X-ray of the abdomen confirmed the findings The patient appears dehydrated and I will start her on IV fluids, pending admission to the hospital I am hoping that her bowel obstruction will resolve with conservative approach If not, we will have to consult surgeon for management I will update GYN surgeon today, Dr. Berline Lopes about her situation

## 2021-01-11 NOTE — Discharge Instructions (Addendum)
1.  At this time your CT scan is not showing significant obstruction of the bowel.  Sometimes early in the process the symptoms are mild. 2.  At this time have a clear liquid diet for the next 24 hours.  You may have broth, Jell-O and other clear liquid foods. 3.  You have been given a prescription for some oxycodone and Zofran for pain and nausea. 4.  Return to emergency department immediately if you are not tolerating liquids, having increasing pain or other concerning symptoms. 5.  See Dr. Alvy Bimler for recheck on Monday.

## 2021-01-11 NOTE — Progress Notes (Signed)
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 Berline Lopes about her situation  Anemia due to antineoplastic chemotherapy Her CBC looks hemoconcentrated Observe for now She will receive IV fluids  SBO (small bowel obstruction) ([place]) She has early signs of small bowel obstruction This is confirmed on imaging We will start her on IV fluids She has stopped vomiting since she has arrived to the cancer center I discussed with the patient the rationale of admitting her to the hospital and she is in agreement I will alert GYN surgeon, Dr. Berline Lopes of her situation  Physical debility The patient wants to go back to work Initially, the purpose of today's visit is to sign some disability paperwork to release her back to work However, given her medical situation, we will put that on hold  Orders Placed This Encounter  Procedures   DG Abd 2 Views    Standing Status:   Future    Number of Occurrences:   1    Standing Expiration Date:   01/11/2022    Order Specific Question:   Reason for exam:    Answer:   ovarian cancer, r/o SBO    Order Specific Question:   Preferred imaging location?    Answer:   [facility]    All questions were answered. The patient knows to call the clinic with any problems, questions or concerns. The total time spent in the appointment was 40 minutes encounter with patients including review of chart and various tests results, discussions about plan of  care and coordination of care plan   Heath Lark, MD 01/11/2021 2:07 PM  INTERVAL HISTORY: Please see below for problem oriented charting. She did not feel well She was well until last night and started severe nausea with vomiting for 5-6 times Zofran did not help Her last vomiting was around 11:00 before she left her house She has severe abdominal cramping, similar to her previous symptom of bowel obstruction Denies chest pain or shortness of breath She has lost 6 pounds of weight since last time I saw her, possible related to dehydration She has not been able to get a dental appointment to evaluate for her dental issues She have stopped taking all her pain medicine in anticipation to go back to work  SUMMARY OF ONCOLOGIC HISTORY: Oncology History Overview Note  Clear cell features Negative genetics   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 There is a 16 cm complex cystic mass in the pelvis containing thickened septations located near midline, abutting the superior aspect of the uterus. I suspect this mass is adnexal/ovarian in origin rather than uterine in origin. Specifically, I suspect the mass likely arises from the left ovary/adnexa and is most consistent with a neoplasm. Malignancy is certainly not excluded on this study. Recommend a pelvic ultrasound for further evaluation. 2. The rounded more solid-appearing structure in the right side of the pelvis is probably the right ovary. Recommend attention  on ultrasound. 3. The small amount of fluid in the pelvis is likely reactive to the complex cystic mass likely rising from the left ovary/adnexa. 4. No cause for blood in vomit or black stools identified. No convincing evidence of a perforated or inflamed gastric or duodenal ulcer. 5. Hepatic steatosis.   07/02/2019 Imaging   MRI pelvis 1. There is a large (15 cm) solid and cystic mass which appears to be arising from the left  ovary concerning for cystic ovarian neoplasm. There are 2 enhancing nodules within the central upper abdomen raising the possibility of peritoneal carcinomatosis. Additionally, there is a small amount fluid within the pelvis. Malignant ascites not excluded. 2. Fibroid uterus. 3. Hepatic steatosis.   07/02/2019 Imaging   US pelvis 1. There is a large mass in the pelvis also seen on CT imaging. A separate left ovary is not visualized. 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 The mass is suspicious for malignancy. Recommend gynecologic consultation. 2. The right ovary demonstrates an irregular ill-defined hypoechoic hypervascular region centrally. The findings are nonspecific in the right ovary. However, given the apparent left ovarian neoplasm suspicious for malignancy, recommend an MRI to further assess the right ovary. 3. Uterine fibroid measuring 3.6 cm.     07/21/2019 Pathology Results   FINAL MICROSCOPIC DIAGNOSIS: A. 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 - See oncology table. B. OMENTUM, RESECTION: - Omental lymph node with metastatic carcinoma (1/1). - Omental adipose tissue with foci of inflammation and reactive mesothelial changes. C. FALLOPIAN TUBE, LEFT, RESECTION: - Fallopian tube with focal, mild inflammation and fibrosis. - No tumor identified. D. UTERUS, CERVIX, RIGHT TUBE AND OVARY: - Cervix Nabothian cyst and squamous metaplasia. - Endometrium Proliferative. No hyperplasia or carcinoma. - Myometrium Leiomyomata. No malignancy. -Right ovary Poorly differentiated carcinoma, 4.8 cm. See oncology table and comment. -Right Fallopian tube Benign paratubal cyst. No endometriosis or malignancy. ONCOLOGY TABLE: OVARY or FALLOPIAN TUBE or PRIMARY PERITONEUM: Procedure: Bilateral f-oophorectomy, hysterectomy and omentectomy. Specimen Integrity: Intact Tumor Site: Left ovary and right ovary.  See comment. Ovarian Surface Involvement: Not identified. Fallopian Tube Surface Involvement: Not identified. Tumor Size: Left ovary: 18.6 x 16.0 x 7.2 cm. Right ovary: 4.8 x 3.2 x 3.2 cm. Histologic Type: Clear cell carcinoma. See comment. Histologic Grade: High-grade. Other Tissue/ Organ Involvement: Omental lymph node with metastatic carcinoma. Peritoneal/Ascitic Fluid: Pending. Treatment Effect: No known presurgical therapy. Pathologic Stage Classification (pTNM, AJCC 8th Edition): pT3a, pN1a. Representative Tumor Block: A2, A3, A4, A5 A6, A7 and A8. Comment(s): The left ovarian tumor is 18.6 cm in greatest dimension and has features of clear cell carcinoma. The right ovarian tumor is 4.8 cm in greatest dimension and is poorly differentiated with focal features consistent with clear cell carcinoma. The pattern of involvement suggests that the right ovarian tumor is likely metastatic from the larger left ovarian clear cell carcinoma. Sections of the omentum show a lymph node with metastatic carcinoma. Sections of the remainder of the omentum do not show involvement by carcinoma.   07/21/2019 Surgery   Procedure(s) Performed: Exploratory laparotomy with radical tumor debulking including total hysterectomy, bilateral salpingo-oophorectomy, infra-gastric omentectomy, and washings.   Surgeon: Jeral Pinch, MD    Operative Findings: On EUA, mobile uterus and ovarian mass, situated out of the pelvis, spanning 6-8 cm above the umbilicus.  On intra-abdominal entry, inflamed omentum adherent to much of the 16 cm left ovarian mass, adhesions easily lysed bluntly.  No  nodularity of the ovarian mass however on delivery of the mass through the midline incision, there was Intra-Op rupture of one of the smaller cystic components with drainage of clear brown-tinged fluid.  Grossly normal-appearing left fallopian tube as well as right tube.  Right ovary mildly enlarged measuring approximately 4 cm and  firm/nodular, suspicious for tumor involvement.  Uterus 8-10 cm with 4 cm anterior mid body fibroid.  Some very small volume miliary disease along right pelvic sidewall, removed with uterine specimen.  Evidence of small diverticular disease.  Small bowel normal appearing although multiple loops of bowel adherent to each other, especially by mesentery, and an inflammatory manner.  An approximately 2 x 2 centimeter nodule suspicious for tumor implant was found in the omentum just inferior to the greater curvature of the stomach.  Additional small (less than 5 mm) implants noted on the stomach and posterior aspect of the left lobe of the liver.  Otherwise the liver surface and diaphragm were smooth.   08/03/2019 Cancer Staging   Staging form: Ovary, Fallopian Tube, and Primary Peritoneal Carcinoma, AJCC 8th Edition - Pathologic: Stage IIIA2 (pT3a, pN1, cM0) - Signed by Heath Lark, MD on 08/03/2019    08/12/2019 Procedure   Placement of single lumen port a cath via right internal jugular vein. The catheter tip lies at the cavo-atrial junction. A power injectable port a cath was placed and is ready for immediate use.   08/22/2019 -  Chemotherapy   The patient had carboplatin and taxol for chemotherapy treatment.     09/23/2019 Genetic Testing   Negative genetic testing:  No pathogenic variants detected on the [facility] TumorNext-HRD+CancerNext paired germline and somatic genetic test. The report date is 09/23/2019.  The TumorNext-HRD+CancerNext test offered by [facility] includes paired germline and tumor analyses of 11 genes associated with homologous recombination repair (ATM, BARD1, BRIP1, CHEK2, MRE11A, NBN, PALB2, RAD51C, RAD51D, BRCA1, BRCA2) plus germline analyses of 26 additional genes associated with hereditary cancer (APC, AXIN2, BMPR1A, CDH1, CDK4, CDKN2A, DICER1, HOXB13, EPCAM, GREM1, MLH1, MSH2, MSH3, MSH6, MUTYH, NF1, NTHL1, PMS2, POLD1, POLE, PTEN, RECQL, SMAD4, SMARCA4, STK11, and TP53).   11/14/2019  Tumor Marker   Patient's tumor was tested for the following markers: CA-125 Results of the tumor marker test revealed 10.2.   12/05/2019 Tumor Marker   Patient's tumor was tested for the following markers: CA-125 Results of the tumor marker test revealed 9.9   01/05/2020 Imaging   No specific findings of active malignancy. Interval resection of the pelvic mass. Subtle nodularity along the right side of the vaginal cuff merits surveillance.     01/05/2020 Tumor Marker   Patient's tumor was tested for the following markers: CA-125 Results of the tumor marker test revealed 9.7   02/17/2020 Tumor Marker   Patient's tumor was tested for the following markers: CA-125 Results of the tumor marker test revealed 9.6   04/05/2020 Imaging   1. There is some minimal stranding along the mesentery and omentum without overt omental caking or well-defined nodularity. Faint bandlike density along the anterior peritoneal reflection in the pelvis, slightly more notable than on prior. Overall the appearance is not considered specific for recurrence but given the slight increase in prominence from 01/05/2020, would encourage careful surveillance by tumor markers and imaging. 2. Mild lower lumbar degenerative facet arthropathy.   04/05/2020 Tumor Marker   Patient's tumor was tested for the following markers: CA-125 Results of the tumor marker test revealed 11   05/29/2020 Tumor Marker   Patient's tumor  was tested for the following markers: CA-125. Results of the tumor marker test revealed 17.   07/04/2020 Tumor Marker   Patient's tumor was tested for the following markers: CA-125. Results of the tumor marker test revealed 20.1   07/16/2020 Imaging   Stable mild stranding throughout mesenteric and omental fat, without evidence of discrete masses or ascites. No evidence of new or progressive disease.   08/29/2020 Tumor Marker   Patient's tumor was tested for the following markers: CA-125 Results of the tumor  marker test revealed 32.7.   09/07/2020 Imaging   1. Interval progression of peritoneal disease within the abdomen and pelvis. Tumor predominantly involves the serosal surface of the large and small bowel loops. No signs of bowel obstruction identified at this time. 2. No evidence for metastatic disease to the chest.   09/19/2020 Imaging   1. No acute findings identified within the abdomen or pelvis. No evidence for bowel obstruction. 2. Similar appearance of soft tissue thickening along the peritoneal reflections within the abdomen and pelvis compatible with known peritoneal disease. Recently characterized increased soft tissue along the surface of the jejunal bowel loops and tethering of pelvic small bowel loops is difficult to quantify (and compared with previous exam) due to lack of IV contrast material.     09/20/2020 -  Chemotherapy    Patient is on Treatment Plan: OVARIAN RECURRENT 3RD LINE CARBOPLATIN D1 / GEMCITABINE D1,8 (4/800) Q21D  Carboplatin was discontinued after 10/15/2020 due to allergic reaction      10/01/2020 Tumor Marker   Patient's tumor was tested for the following markers: CA-125. Results of the tumor marker test revealed 37.2.   10/15/2020 Tumor Marker   Patient's tumor was tested for the following markers: CA-125. Results of the tumor marker test revealed 48   10/22/2020 Tumor Marker   Patient's tumor was tested for the following markers: CA-125 Results of the tumor marker test revealed 28.5   11/05/2020 Tumor Marker   Patient's tumor was tested for the following markers: CA-125 Results of the tumor marker test revealed 18.9   12/17/2020 Imaging   1. No significant change in soft tissue thickening and nodularity in the low pelvis, with minimal thickening of the peritoneum throughout the abdomen. 2. Similar appearance of soft tissue thickening about the mid small bowel in the central, ventral abdomen. 3. Findings are consistent with stable peritoneal metastatic  disease. 4. Status post hysterectomy and cholecystectomy.   12/18/2020 Tumor Marker   Patient's tumor was tested for the following markers: CA-125 Results of the tumor marker test revealed 10.8   01/11/2021 Imaging   Findings concerning for small bowel obstruction.       REVIEW OF SYSTEMS:   Constitutional: Denies fevers, chills or abnormal weight loss Eyes: Denies blurriness of vision Ears, nose, mouth, throat, and face: Denies mucositis or sore throat Respiratory: Denies cough, dyspnea or wheezes Cardiovascular: Denies palpitation, chest discomfort or lower extremity swelling Skin: Denies abnormal skin rashes Lymphatics: Denies new lymphadenopathy or easy bruising Neurological:Denies numbness, tingling or new weaknesses Behavioral/Psych: Mood is stable, no new changes  All other systems were reviewed with the patient and are negative.  I have reviewed the past medical history, past surgical history, social history and family history with the patient and they are unchanged from previous note.  ALLERGIES:  is allergic to carboplatin.  MEDICATIONS:  Current Outpatient Medications  Medication Sig Dispense Refill   acetaminophen (TYLENOL) 500 MG tablet Take 1,000 mg by mouth every 6 (six) hours as  needed for moderate pain.      amoxicillin (AMOXIL) 500 MG capsule Take 1 capsule (500 mg total) by mouth 2 (two) times daily. 14 capsule 0   lidocaine-prilocaine (EMLA) cream APPLY TO THE AFFECTED AREA AS DIRECTED TO ACCESS PORT 30 g 11   metFORMIN (GLUCOPHAGE) 500 MG tablet TAKE 1 TABLET BY MOUTH TWICE DAILY WITH A MEAL 60 tablet 1   morphine (MSIR) 15 MG tablet Take 1 tablet (15 mg total) by mouth every 4 (four) hours as needed. 60 tablet 0   Multiple Vitamin (MULTIVITAMIN WITH MINERALS) TABS tablet Take 1 tablet by mouth daily.     ondansetron (ZOFRAN) 8 MG tablet TAKE 1 TABLET BY MOUTH EVERY 8 HOURS AS NEEDED FOR NAUSEA OR VOMITING 60 tablet 1   pantoprazole (PROTONIX) 40 MG tablet  TAKE 1 TABLET BY MOUTH DAILY 30 tablet 11   prochlorperazine (COMPAZINE) 10 MG tablet Take 10 mg by mouth every 6 (six) hours as needed.     No current facility-administered medications for this visit.    PHYSICAL EXAMINATION: ECOG PERFORMANCE STATUS: 1 - Symptomatic but completely ambulatory  Vitals:   01/11/21 1217  BP: (!) 152/117  Pulse: 82  Resp: 18  Temp: 97.7 F (36.5 C)  SpO2: 100%   Filed Weights   01/11/21 1217  Weight: 196 lb (88.9 kg)    GENERAL:alert, no distress and comfortable SKIN: skin color, texture, turgor are normal, no rashes or significant lesions EYES: normal, Conjunctiva are pink and non-injected, sclera clear OROPHARYNX:no exudate, no erythema and lips, buccal mucosa, and tongue normal  NECK: supple, thyroid normal size, non-tender, without nodularity LYMPH:  no palpable lymphadenopathy in the cervical, axillary or inguinal LUNGS: clear to auscultation and percussion with normal breathing effort HEART: regular rate & rhythm and no murmurs and no lower extremity edema ABDOMEN:abdomen soft, tenderness on palpation on the epigastrium.  Hyperactive bowel sounds in the epigastrium but reduced bowel sounds throughout her abdomen. Musculoskeletal:no cyanosis of digits and no clubbing  NEURO: alert & oriented x 3 with fluent speech, no focal motor/sensory deficits  LABORATORY DATA:  I have reviewed the data as listed    Component Value Date/Time   NA 141 01/11/2021 1157   K 3.5 01/11/2021 1157   CL 105 01/11/2021 1157   CO2 26 01/11/2021 1157   GLUCOSE 114 (H) 01/11/2021 1157   BUN 9 01/11/2021 1157   CREATININE 0.60 01/11/2021 1157   CALCIUM 9.6 01/11/2021 1157   PROT 7.6 01/11/2021 1157   ALBUMIN 3.9 01/11/2021 1157   AST 18 01/11/2021 1157   ALT 14 01/11/2021 1157   ALKPHOS 86 01/11/2021 1157   BILITOT 0.4 01/11/2021 1157   GFRNONAA >60 01/11/2021 1157   GFRAA >60 04/05/2020 1008    No results found for: SPEP, UPEP  Lab Results   Component Value Date   WBC 8.8 01/11/2021   NEUTROABS 6.4 01/11/2021   HGB 11.0 (L) 01/11/2021   HCT 34.7 (L) 01/11/2021   MCV 87.2 01/11/2021   PLT 706 (H) 01/11/2021      Chemistry      Component Value Date/Time   NA 141 01/11/2021 1157   K 3.5 01/11/2021 1157   CL 105 01/11/2021 1157   CO2 26 01/11/2021 1157   BUN 9 01/11/2021 1157   CREATININE 0.60 01/11/2021 1157      Component Value Date/Time   CALCIUM 9.6 01/11/2021 1157   ALKPHOS 86 01/11/2021 1157   AST 18 01/11/2021 1157   ALT  14 01/11/2021 1157   BILITOT 0.4 01/11/2021 1157       RADIOGRAPHIC STUDIES: I have personally reviewed the radiological images as listed and agreed with the findings in the report. CT Abdomen Pelvis Wo Contrast  Result Date: 12/17/2020 CLINICAL DATA:  Recurrent ovarian cancer, assess treatment response, second round of chemotherapy complete EXAM: CT ABDOMEN AND PELVIS WITHOUT CONTRAST TECHNIQUE: Multidetector CT imaging of the abdomen and pelvis was performed following the standard protocol without IV contrast. Oral enteric contrast was administered. COMPARISON:  09/19/2020 FINDINGS: Lower chest: No acute abnormality. Hepatobiliary: No focal liver abnormality is seen. Status post cholecystectomy. Postoperative biliary dilatation. Pancreas: Unremarkable. No pancreatic ductal dilatation or surrounding inflammatory changes. Spleen: Normal in size without significant abnormality. Adrenals/Urinary Tract: Adrenal glands are unremarkable. Kidneys are normal, without renal calculi, solid lesion, or hydronephrosis. Bladder is unremarkable. Stomach/Bowel: Stomach is within normal limits. Appendix appears normal. Similar appearance of soft tissue thickening about the mid small bowel in the central, ventral abdomen (series 2, image 44) Vascular/Lymphatic: No significant vascular findings are present. No enlarged abdominal or pelvic lymph nodes. Reproductive: Status post hysterectomy. Other: No abdominal wall  hernia or abnormality. No abdominopelvic ascites. No significant change in soft tissue thickening and nodularity in the low pelvis, with minimal thickening of the peritoneum throughout the abdomen (series 2, image 39, 31 74). Musculoskeletal: No acute or significant osseous findings. IMPRESSION: 1. No significant change in soft tissue thickening and nodularity in the low pelvis, with minimal thickening of the peritoneum throughout the abdomen. 2. Similar appearance of soft tissue thickening about the mid small bowel in the central, ventral abdomen. 3. Findings are consistent with stable peritoneal metastatic disease. 4. Status post hysterectomy and cholecystectomy. Electronically Signed   By: Eddie Candle M.D.   On: 12/17/2020 15:32   DG Abd 2 Views  Result Date: 01/11/2021 CLINICAL DATA:  Ovarian cancer.  Rule out small bowel obstruction. EXAM: ABDOMEN - 2 VIEW COMPARISON:  12/17/2020 FINDINGS: Since the previous exam there is been interval decrease in colonic gas with central small bowel dilatation and air-fluid levels. The small bowel loops measure up to 4.4 cm. Cholecystectomy. IMPRESSION: Findings concerning for small bowel obstruction. Electronically Signed   By: Kerby Moors M.D.   On: 01/11/2021 13:16

## 2021-01-11 NOTE — Progress Notes (Signed)
[place] Spiritual Care Note  Followed up with Sarah Newman in infusion as planned, providing emotional support as she began to process her need to go to [facility] for admission to [facility] for bowel obstruction. We plan to follow up for more support and processing by phone next week.   [place], [place], [facility] Pager [phone] Voicemail [phone]

## 2021-01-11 NOTE — Progress Notes (Signed)
Per Dr. Alvy Bimler no treatment today, pt. to receive IV fluids. Awaiting xray results. Dr. Alvy Bimler in for consultation and review of xray. Per Dr. Alvy Bimler, pt. sent to the emergency room via wheelchair, no respiratory distress noted.

## 2021-01-11 NOTE — Assessment & Plan Note (Signed)
She has early signs of small bowel obstruction This is confirmed on imaging We will start her on IV fluids She has stopped vomiting since she has arrived to the cancer center I discussed with the patient the rationale of admitting her to the hospital and she is in agreement I will alert GYN surgeon, Dr. Berline Lopes of her situation

## 2021-01-11 NOTE — ED Triage Notes (Signed)
Pt arrives from [facility] with confirmed bowel obstruction. Pt states abd pain is 10/10

## 2021-01-11 NOTE — Assessment & Plan Note (Signed)
Her CBC looks hemoconcentrated Observe for now She will receive IV fluids

## 2021-01-12 LAB — CA 125: Cancer Antigen (CA) 125: 10.4 U/mL (ref 0.0–38.1)

## 2021-01-14 ENCOUNTER — Other Ambulatory Visit: Payer: Self-pay | Admitting: Hematology and Oncology

## 2021-01-14 ENCOUNTER — Telehealth: Payer: Self-pay

## 2021-01-14 NOTE — Telephone Encounter (Signed)
Called and given below message. She verbalized understanding. She feels good and is glad to be home. She is taking a clear liquid diet. Abdomen is rumbling. No bm since Wednesday. She thinks she be getting ready to have bm. No vomiting or nausea. She is taking Miralax daily and Senokot 2 tabs BID. Instructed to start Miralax BID and Senokot 2 tabs TID. She verbalized understanding.

## 2021-01-14 NOTE — Telephone Encounter (Signed)
Called back and given appt for lab/flush, Dr. Alvy Bimler, and infusion appt starting at 8 am. Dr. Alvy Bimler will explain the treatment option at appt. She verbalized understanding.

## 2021-01-14 NOTE — Telephone Encounter (Signed)
-----   Message from Heath Lark, MD sent at 01/14/2021  7:57 AM EDT ----- Can you call and ask how she is doing? I plan to review the CT scan with Dr. Berline Lopes today and will let her know how to proceed with next step

## 2021-01-15 ENCOUNTER — Inpatient Hospital Stay: Payer: Medicaid Other

## 2021-01-15 ENCOUNTER — Other Ambulatory Visit: Payer: Self-pay

## 2021-01-15 ENCOUNTER — Telehealth: Payer: Self-pay | Admitting: Gynecologic Oncology

## 2021-01-15 ENCOUNTER — Encounter: Payer: Self-pay | Admitting: Hematology and Oncology

## 2021-01-15 ENCOUNTER — Inpatient Hospital Stay (HOSPITAL_BASED_OUTPATIENT_CLINIC_OR_DEPARTMENT_OTHER): Payer: Medicaid Other | Admitting: Hematology and Oncology

## 2021-01-15 VITALS — BP 121/86 | Temp 98.3°F | Ht 64.0 in | Wt 196.0 lb

## 2021-01-15 DIAGNOSIS — C562 Malignant neoplasm of left ovary: Secondary | ICD-10-CM

## 2021-01-15 LAB — CBC WITH DIFFERENTIAL (CANCER CENTER ONLY)
Abs Immature Granulocytes: 0.01 10*3/uL (ref 0.00–0.07)
Basophils Absolute: 0 10*3/uL (ref 0.0–0.1)
Basophils Relative: 0 %
Eosinophils Absolute: 0.1 10*3/uL (ref 0.0–0.5)
Eosinophils Relative: 3 %
HCT: 32.8 % — ABNORMAL LOW (ref 36.0–46.0)
Hemoglobin: 10.3 g/dL — ABNORMAL LOW (ref 12.0–15.0)
Immature Granulocytes: 0 %
Lymphocytes Relative: 27 %
Lymphs Abs: 1.2 10*3/uL (ref 0.7–4.0)
MCH: 27.5 pg (ref 26.0–34.0)
MCHC: 31.4 g/dL (ref 30.0–36.0)
MCV: 87.7 fL (ref 80.0–100.0)
Monocytes Absolute: 0.4 10*3/uL (ref 0.1–1.0)
Monocytes Relative: 10 %
Neutro Abs: 2.6 10*3/uL (ref 1.7–7.7)
Neutrophils Relative %: 60 %
Platelet Count: 560 10*3/uL — ABNORMAL HIGH (ref 150–400)
RBC: 3.74 MIL/uL — ABNORMAL LOW (ref 3.87–5.11)
RDW: 15.7 % — ABNORMAL HIGH (ref 11.5–15.5)
WBC Count: 4.5 10*3/uL (ref 4.0–10.5)
nRBC: 0 % (ref 0.0–0.2)

## 2021-01-15 LAB — CMP (CANCER CENTER ONLY)
ALT: 13 U/L (ref 0–44)
AST: 18 U/L (ref 15–41)
Albumin: 3.7 g/dL (ref 3.5–5.0)
Alkaline Phosphatase: 79 U/L (ref 38–126)
Anion gap: 10 (ref 5–15)
BUN: 8 mg/dL (ref 6–20)
CO2: 27 mmol/L (ref 22–32)
Calcium: 9.5 mg/dL (ref 8.9–10.3)
Chloride: 105 mmol/L (ref 98–111)
Creatinine: 0.68 mg/dL (ref 0.44–1.00)
GFR, Estimated: >60 mL/min (ref >60)
Glucose, Bld: 125 mg/dL — ABNORMAL HIGH (ref 70–99)
Potassium: 3.6 mmol/L (ref 3.5–5.1)
Sodium: 142 mmol/L (ref 135–145)
Total Bilirubin: 0.3 mg/dL (ref 0.3–1.2)
Total Protein: 7.1 g/dL (ref 6.5–8.1)

## 2021-01-15 MED ORDER — SODIUM CHLORIDE 0.9% FLUSH
10.0000 mL | Freq: Once | INTRAVENOUS | Status: AC
Start: 2021-01-15 — End: 2021-01-15
  Administered 2021-01-15: 10 mL
  Filled 2021-01-15: qty 10

## 2021-01-15 NOTE — Assessment & Plan Note (Signed)
I have reviewed CT imaging with the patient and her husband She does not have definitive signs of cancer However, she has been complaining of intermittent signs and symptoms of subacute bowel obstruction The cause is unknown Her tumor marker is not elevated We discussed the risk and benefits of GYN surgeon evaluation for consideration of diagnostic laparoscopy versus continuing treatment The patient and her husband has made informed decision to get evaluated by GYN surgeon I will cancel her treatment today Due to unpredictable outcome from her appointment, I am not able to complete her paperwork to release her back to work

## 2021-01-15 NOTE — Progress Notes (Signed)
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) I have reviewed CT imaging with the patient and her husband She does not have definitive signs of cancer However, she has been complaining of intermittent signs and symptoms of subacute bowel obstruction The cause is unknown Her tumor marker is not elevated We discussed the risk and benefits of GYN surgeon evaluation for consideration of diagnostic laparoscopy versus continuing treatment The patient and her husband has made informed decision to get evaluated by GYN surgeon I will cancel her treatment today Due to unpredictable outcome from her appointment, I am not able to complete her paperwork to release her back to work  No orders of the defined types were placed in this encounter.   All questions were answered. The patient knows to call the clinic with any problems, questions or concerns. The total time spent in the appointment was 25 minutes encounter with patients including review of chart and various tests results, discussions about plan of care and coordination of care plan   Heath Lark, MD 01/15/2021 1:13 PM  INTERVAL HISTORY: Please see below for problem oriented charting. 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 There is a 16 cm complex cystic mass in the pelvis containing thickened septations located near midline, abutting the superior aspect of the uterus. I suspect this mass is adnexal/ovarian in origin rather than  uterine in origin. Specifically, I suspect the mass likely arises from the left ovary/adnexa and is most consistent with a neoplasm. Malignancy is certainly not excluded on this study. Recommend a pelvic ultrasound for further evaluation. 2. The rounded more solid-appearing structure in the right side of the pelvis is probably the right ovary. Recommend attention on ultrasound. 3. The small amount of fluid in the pelvis is likely reactive to the complex cystic mass likely rising from the left ovary/adnexa. 4. No cause for blood in vomit or black stools identified. No convincing evidence of a perforated or inflamed gastric or duodenal ulcer. 5. Hepatic steatosis.   07/02/2019 Imaging   MRI pelvis 1. 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 There are 2 enhancing nodules within the central upper abdomen raising the possibility of peritoneal carcinomatosis. Additionally, there is a small amount fluid within the pelvis. Malignant ascites not excluded. 2. Fibroid uterus. 3. Hepatic steatosis.   07/02/2019 Imaging   US pelvis 1. There is a large mass in the pelvis also seen on CT imaging. A separate left ovary is not visualized. 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 The mass is suspicious for malignancy. Recommend gynecologic consultation. 2. The right ovary demonstrates an irregular ill-defined hypoechoic hypervascular region centrally. The findings are nonspecific in the right ovary. However, given the apparent left ovarian neoplasm suspicious for malignancy, recommend an MRI to further assess the right ovary. 3. Uterine fibroid measuring 3.6 cm.     07/21/2019 Pathology Results   FINAL MICROSCOPIC  DIAGNOSIS: A. 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 - See oncology table. B. OMENTUM, RESECTION: - Omental lymph node with  metastatic carcinoma (1/1). - Omental adipose tissue with foci of inflammation and reactive mesothelial changes. C. FALLOPIAN TUBE, LEFT, RESECTION: - Fallopian tube with focal, mild inflammation and fibrosis. - No tumor identified. D. UTERUS, CERVIX, RIGHT TUBE AND OVARY: - Cervix Nabothian cyst and squamous metaplasia. - Endometrium Proliferative. No hyperplasia or carcinoma. - Myometrium Leiomyomata. No malignancy. -Right ovary Poorly differentiated carcinoma, 4.8 cm. See oncology table and comment. -Right Fallopian tube Benign paratubal cyst. No endometriosis or malignancy. ONCOLOGY TABLE: OVARY or FALLOPIAN TUBE or PRIMARY PERITONEUM: Procedure: Bilateral f-oophorectomy, hysterectomy and omentectomy. Specimen Integrity: Intact Tumor Site: Left ovary and right ovary. See comment. Ovarian Surface Involvement: Not identified. Fallopian Tube Surface Involvement: Not identified. Tumor Size: Left ovary: 18.6 x 16.0 x 7.2 cm. Right ovary: 4.8 x 3.2 x 3.2 cm. Histologic Type: Clear cell carcinoma. See comment. Histologic Grade: High-grade. Other Tissue/ Organ Involvement: Omental lymph node with metastatic carcinoma. Peritoneal/Ascitic Fluid: Pending. Treatment Effect: No known presurgical therapy. Pathologic Stage Classification (pTNM, AJCC 8th Edition): pT3a, pN1a. Representative Tumor Block: A2, A3, A4, A5 A6, A7 and A8. Comment(s): The left ovarian tumor is 18.6 cm in greatest dimension and has features of clear cell carcinoma. The right ovarian tumor is 4.8 cm in greatest dimension and is poorly differentiated with focal features consistent with clear cell carcinoma. The pattern of involvement suggests that the right ovarian tumor is likely metastatic from the larger left ovarian clear cell carcinoma. Sections of the omentum show a lymph node with metastatic carcinoma. Sections of the remainder of the omentum do not show involvement by carcinoma.   07/21/2019 Surgery    Procedure(s) Performed: Exploratory laparotomy with radical tumor debulking including total hysterectomy, bilateral salpingo-oophorectomy, infra-gastric omentectomy, and washings.   Surgeon: Jeral Pinch, MD    Operative Findings: On EUA, mobile uterus and ovarian mass, situated out of the pelvis, spanning 6-8 cm above the umbilicus.  On intra-abdominal entry, inflamed omentum adherent to much of the 16 cm left ovarian mass, adhesions easily lysed bluntly.  No nodularity of the ovarian mass however on delivery of the mass through the midline incision, there was Intra-Op rupture of one of the smaller cystic components with drainage of clear brown-tinged fluid.  Grossly normal-appearing left fallopian tube as well as right tube.  Right ovary mildly enlarged measuring approximately 4 cm and firm/nodular, suspicious for tumor involvement.  Uterus 8-10 cm with 4 cm anterior mid body fibroid.  Some very small volume miliary disease along right pelvic sidewall, removed with uterine specimen.  Evidence of small diverticular disease.  Small bowel normal appearing although multiple loops of bowel adherent to each other, especially by mesentery, and an inflammatory manner.  An approximately 2 x 2 centimeter nodule suspicious for tumor implant was found in the omentum just inferior to the greater curvature of the stomach.  Additional small (less than 5 mm) implants noted on the stomach and posterior aspect of the left lobe of the liver.  Otherwise the liver surface and diaphragm were smooth.   08/03/2019 Cancer Staging   Staging form: Ovary, Fallopian Tube, and Primary Peritoneal Carcinoma, AJCC 8th Edition - Pathologic: Stage IIIA2 (pT3a, pN1, cM0) - Signed by Heath Lark, MD on 08/03/2019    08/12/2019 Procedure   Placement of single lumen port a cath via right internal jugular vein. The catheter tip lies at the cavo-atrial  junction. A power injectable port a cath was placed and is ready for immediate use.    08/22/2019 -  Chemotherapy   The patient had carboplatin and taxol for chemotherapy treatment.     09/23/2019 Genetic Testing   Negative genetic testing:  No pathogenic variants detected on the [facility] TumorNext-HRD+CancerNext paired germline and somatic genetic test. The report date is 09/23/2019.  The TumorNext-HRD+CancerNext test offered by [facility] includes paired germline and tumor analyses of 11 genes associated with homologous recombination repair (ATM, BARD1, BRIP1, CHEK2, MRE11A, NBN, PALB2, RAD51C, RAD51D, BRCA1, BRCA2) plus germline analyses of 26 additional genes associated with hereditary cancer (APC, AXIN2, BMPR1A, CDH1, CDK4, CDKN2A, DICER1, HOXB13, EPCAM, GREM1, MLH1, MSH2, MSH3, MSH6, MUTYH, NF1, NTHL1, PMS2, POLD1, POLE, PTEN, RECQL, SMAD4, SMARCA4, STK11, and TP53).   11/14/2019 Tumor Marker   Patient's tumor was tested for the following markers: CA-125 Results of the tumor marker test revealed 10.2.   12/05/2019 Tumor Marker   Patient's tumor was tested for the following markers: CA-125 Results of the tumor marker test revealed 9.9   01/05/2020 Imaging   No specific findings of active malignancy. Interval resection of the pelvic mass. Subtle nodularity along the right side of the vaginal cuff merits surveillance.     01/05/2020 Tumor Marker   Patient's tumor was tested for the following markers: CA-125 Results of the tumor marker test revealed 9.7   02/17/2020 Tumor Marker   Patient's tumor was tested for the following markers: CA-125 Results of the tumor marker test revealed 9.6   04/05/2020 Imaging   1. There is some minimal stranding along the mesentery and omentum without overt omental caking or well-defined nodularity. Faint bandlike density along the anterior peritoneal reflection in the pelvis, slightly more notable than on prior. Overall the appearance is not considered specific for recurrence but given the slight increase in prominence from 01/05/2020, would encourage  careful surveillance by tumor markers and imaging. 2. Mild lower lumbar degenerative facet arthropathy.   04/05/2020 Tumor Marker   Patient's tumor was tested for the following markers: CA-125 Results of the tumor marker test revealed 11   05/29/2020 Tumor Marker   Patient's tumor was tested for the following markers: CA-125. Results of the tumor marker test revealed 17.   07/04/2020 Tumor Marker   Patient's tumor was tested for the following markers: CA-125. Results of the tumor marker test revealed 20.1   07/16/2020 Imaging   Stable mild stranding throughout mesenteric and omental fat, without evidence of discrete masses or ascites. No evidence of new or progressive disease.   08/29/2020 Tumor Marker   Patient's tumor was tested for the following markers: CA-125 Results of the tumor marker test revealed 32.7.   09/07/2020 Imaging   1. Interval progression of peritoneal disease within the abdomen and pelvis. Tumor predominantly involves the serosal surface of the large and small bowel loops. No signs of bowel obstruction identified at this time. 2. No evidence for metastatic disease to the chest.   09/19/2020 Imaging   1. No acute findings identified within the abdomen or pelvis. No evidence for bowel obstruction. 2. Similar appearance of soft tissue thickening along the peritoneal reflections within the abdomen and pelvis compatible with known peritoneal disease. Recently characterized increased soft tissue along the surface of the jejunal bowel loops and tethering of pelvic small bowel loops is difficult to quantify (and compared with previous exam) due to lack of IV contrast material.     09/20/2020 -  Chemotherapy  Patient is on Treatment Plan: OVARIAN RECURRENT 3RD LINE CARBOPLATIN D1 / GEMCITABINE D1,8 (4/800) Q21D  Carboplatin was discontinued after 10/15/2020 due to allergic reaction      10/01/2020 Tumor Marker   Patient's tumor was tested for the following markers:  CA-125. Results of the tumor marker test revealed 37.2.   10/15/2020 Tumor Marker   Patient's tumor was tested for the following markers: CA-125. Results of the tumor marker test revealed 48   10/22/2020 Tumor Marker   Patient's tumor was tested for the following markers: CA-125 Results of the tumor marker test revealed 28.5   11/05/2020 Tumor Marker   Patient's tumor was tested for the following markers: CA-125 Results of the tumor marker test revealed 18.9   12/17/2020 Imaging   1. No significant change in soft tissue thickening and nodularity in the low pelvis, with minimal thickening of the peritoneum throughout the abdomen. 2. Similar appearance of soft tissue thickening about the mid small bowel in the central, ventral abdomen. 3. Findings are consistent with stable peritoneal metastatic disease. 4. Status post hysterectomy and cholecystectomy.   12/18/2020 Tumor Marker   Patient's tumor was tested for the following markers: CA-125 Results of the tumor marker test revealed 10.8   01/11/2021 Imaging   Findings concerning for small bowel obstruction.     01/11/2021 Tumor Marker   Patient's tumor was tested for the following markers: CA-125. Results of the tumor marker test revealed 10.4.   01/11/2021 Imaging   CT abdomen and pelvis Mildly prominent proximal loops of small bowel although no true transition zone is seen in the distal small bowel appears within normal limits. These changes could represent early partial small bowel obstruction. The overall appearance however is similar to that seen on prior CT from May of 2022.   Mild peritoneal thickening similar to that seen on the prior exam.   Fatty liver.   No other focal abnormality is noted.     REVIEW OF SYSTEMS:   Constitutional: Denies fevers, chills or abnormal weight loss Eyes: Denies blurriness of vision Ears, nose, mouth, throat, and face: Denies mucositis or sore throat Respiratory: Denies cough, dyspnea or  wheezes Cardiovascular: Denies palpitation, chest discomfort or lower extremity swelling Gastrointestinal:  Denies nausea, heartburn or change in bowel habits Skin: Denies abnormal skin rashes Lymphatics: Denies new lymphadenopathy or easy bruising Neurological:Denies numbness, tingling or new weaknesses Behavioral/Psych: Mood is stable, no new changes  All other systems were reviewed with the patient and are negative.  I have reviewed the past medical history, past surgical history, social history and family history with the patient and they are unchanged from previous note.  ALLERGIES:  is allergic to carboplatin.  MEDICATIONS:  Current Outpatient Medications  Medication Sig Dispense Refill   acetaminophen (TYLENOL) 500 MG tablet Take 1,000 mg by mouth every 6 (six) hours as needed for moderate pain.      dicyclomine (BENTYL) 10 MG capsule TAKE 1 CAPSULE BY MOUTH 3 TIMES DAILY BEFORE MEALS     famotidine (PEPCID) 20 MG tablet Take 1 tablet by mouth 2 (two) times daily.     lidocaine-prilocaine (EMLA) cream APPLY TO THE AFFECTED AREA AS DIRECTED TO ACCESS PORT 30 g 11   metFORMIN (GLUCOPHAGE) 500 MG tablet TAKE 1 TABLET BY MOUTH TWICE DAILY WITH A MEAL 60 tablet 1   Multiple Vitamin (MULTIVITAMIN WITH MINERALS) TABS tablet Take 1 tablet by mouth daily.     ondansetron (ZOFRAN ODT) 4 MG disintegrating tablet Take 1 tablet (  4 mg total) by mouth every 4 (four) hours as needed for nausea or vomiting. 20 tablet 0   ondansetron (ZOFRAN) 8 MG tablet TAKE 1 TABLET BY MOUTH EVERY 8 HOURS AS NEEDED FOR NAUSEA OR VOMITING 60 tablet 1   oxycodone (OXY-IR) 5 MG capsule Take 1 capsule (5 mg total) by mouth every 4 (four) hours as needed. 20 capsule 0   pantoprazole (PROTONIX) 40 MG tablet TAKE 1 TABLET BY MOUTH DAILY 30 tablet 11   prochlorperazine (COMPAZINE) 10 MG tablet Take 10 mg by mouth every 6 (six) hours as needed.     sucralfate (CARAFATE) 1 g tablet Take 1 tablet by mouth 3 (three) times  daily.     No current facility-administered medications for this visit.    PHYSICAL EXAMINATION: ECOG PERFORMANCE STATUS: 1 - Symptomatic but completely ambulatory  Vitals:   01/15/21 0916  Pulse: 75  Resp: 18  SpO2: 99%   There were no vitals filed for this visit.  GENERAL:alert, no distress and comfortable SKIN: skin color, texture, turgor are normal, no rashes or significant lesions EYES: normal, Conjunctiva are pink and non-injected, sclera clear OROPHARYNX:no exudate, no erythema and lips, buccal mucosa, and tongue normal  NECK: supple, thyroid normal size, non-tender, without nodularity LYMPH:  no palpable lymphadenopathy in the cervical, axillary or inguinal LUNGS: clear to auscultation and percussion with normal breathing effort HEART: regular rate & rhythm and no murmurs and no lower extremity edema ABDOMEN:abdomen soft, non-tender and normal bowel sounds Musculoskeletal:no cyanosis of digits and no clubbing  NEURO: alert & oriented x 3 with fluent speech, no focal motor/sensory deficits  LABORATORY DATA:  I have reviewed the data as listed    Component Value Date/Time   NA 142 01/15/2021 0856   K 3.6 01/15/2021 0856   CL 105 01/15/2021 0856   CO2 27 01/15/2021 0856   GLUCOSE 125 (H) 01/15/2021 0856   BUN 8 01/15/2021 0856   CREATININE 0.68 01/15/2021 0856   CALCIUM 9.5 01/15/2021 0856   PROT 7.1 01/15/2021 0856   ALBUMIN 3.7 01/15/2021 0856   AST 18 01/15/2021 0856   ALT 13 01/15/2021 0856   ALKPHOS 79 01/15/2021 0856   BILITOT 0.3 01/15/2021 0856   GFRNONAA >60 01/15/2021 0856   GFRAA >60 04/05/2020 1008    No results found for: SPEP, UPEP  Lab Results  Component Value Date   WBC 4.5 01/15/2021   NEUTROABS 2.6 01/15/2021   HGB 10.3 (L) 01/15/2021   HCT 32.8 (L) 01/15/2021   MCV 87.7 01/15/2021   PLT 560 (H) 01/15/2021      Chemistry      Component Value Date/Time   NA 142 01/15/2021 0856   K 3.6 01/15/2021 0856   CL 105 01/15/2021 0856    CO2 27 01/15/2021 0856   BUN 8 01/15/2021 0856   CREATININE 0.68 01/15/2021 0856      Component Value Date/Time   CALCIUM 9.5 01/15/2021 0856   ALKPHOS 79 01/15/2021 0856   AST 18 01/15/2021 0856   ALT 13 01/15/2021 0856   BILITOT 0.3 01/15/2021 0856       RADIOGRAPHIC STUDIES: I have reviewed imaging study with the patient and her husband I have personally reviewed the radiological images as listed and agreed with the findings in the report. CT Abdomen Pelvis Wo Contrast  Result Date: 12/17/2020 CLINICAL DATA:  Recurrent ovarian cancer, assess treatment response, second round of chemotherapy complete EXAM: CT ABDOMEN AND PELVIS WITHOUT CONTRAST TECHNIQUE: Multidetector CT imaging  of the abdomen and pelvis was performed following the standard protocol without IV contrast. Oral enteric contrast was administered. COMPARISON:  09/19/2020 FINDINGS: Lower chest: No acute abnormality. Hepatobiliary: No focal liver abnormality is seen. Status post cholecystectomy. Postoperative biliary dilatation. Pancreas: Unremarkable. No pancreatic ductal dilatation or surrounding inflammatory changes. Spleen: Normal in size without significant abnormality. Adrenals/Urinary Tract: Adrenal glands are unremarkable. Kidneys are normal, without renal calculi, solid lesion, or hydronephrosis. Bladder is unremarkable. Stomach/Bowel: Stomach is within normal limits. Appendix appears normal. Similar appearance of soft tissue thickening about the mid small bowel in the central, ventral abdomen (series 2, image 44) Vascular/Lymphatic: No significant vascular findings are present. No enlarged abdominal or pelvic lymph nodes. Reproductive: Status post hysterectomy. Other: No abdominal wall hernia or abnormality. No abdominopelvic ascites. No significant change in soft tissue thickening and nodularity in the low pelvis, with minimal thickening of the peritoneum throughout the abdomen (series 2, image 39, 31 [id number]). Musculoskeletal: No  acute or significant osseous findings. IMPRESSION: 1. No significant change in soft tissue thickening and nodularity in the low pelvis, with minimal thickening of the peritoneum throughout the abdomen. 2. Similar appearance of soft tissue thickening about the mid small bowel in the central, ventral abdomen. 3. Findings are consistent with stable peritoneal metastatic disease. 4. Status post hysterectomy and cholecystectomy. Electronically Signed   By: Eddie Candle M.D.   On: 12/17/2020 15:32   CT Abdomen Pelvis W Contrast  Result Date: 01/11/2021 CLINICAL DATA:  Possible small bowel obstruction, history of ovarian carcinoma EXAM: CT ABDOMEN AND PELVIS WITH CONTRAST TECHNIQUE: Multidetector CT imaging of the abdomen and pelvis was performed using the standard protocol following bolus administration of intravenous contrast. CONTRAST:  18m OMNIPAQUE IOHEXOL 300 MG/ML  SOLN COMPARISON:  12/17/2020 CT, plain film from earlier in the same day. FINDINGS: Lower chest: No acute abnormality. Hepatobiliary: Fatty infiltration of the liver is noted. The gallbladder has been surgically removed. Biliary duct is within normal limits. Pancreas: Unremarkable. No pancreatic ductal dilatation or surrounding inflammatory changes. Spleen: Normal in size without focal abnormality. Adrenals/Urinary Tract: Adrenal glands are unremarkable. Kidneys demonstrate a normal enhancement pattern bilaterally. No renal calculi or obstructive changes are noted. Normal excretion is noted on delayed images. The bladder is partially distended. Stomach/Bowel: No obstructive or inflammatory changes of the colon are seen. The appendix is within normal limits. Small bowel demonstrates a few mildly prominent loops in the mid abdomen although no definitive transition zone is identified. The distal small bowel appears within normal limits. Stomach is within normal limits. Vascular/Lymphatic: No significant vascular findings are present. No enlarged  abdominal or pelvic lymph nodes. Reproductive: Status post hysterectomy. No adnexal masses. Other: Minimal free fluid is noted within the pelvis. Some areas of peritoneal thickening are again seen and stable. No discrete peritoneal mass is seen. Musculoskeletal: No acute bony abnormality is noted. Degenerative changes of lumbar spine are seen. IMPRESSION: Mildly prominent proximal loops of small bowel although no true transition zone is seen in the distal small bowel appears within normal limits. These changes could represent early partial small bowel obstruction. The overall appearance however is similar to that seen on prior CT from May of 2022. Mild peritoneal thickening similar to that seen on the prior exam. Fatty liver. No other focal abnormality is noted. Electronically Signed   By: MInez CatalinaM.D.   On: 01/11/2021 21:15   DG Abd 2 Views  Result Date: 01/11/2021 CLINICAL DATA:  Ovarian cancer.  Rule out small bowel obstruction.  EXAM: ABDOMEN - 2 VIEW COMPARISON:  12/17/2020 FINDINGS: Since the previous exam there is been interval decrease in colonic gas with central small bowel dilatation and air-fluid levels. The small bowel loops measure up to 4.4 cm. Cholecystectomy. IMPRESSION: Findings concerning for small bowel obstruction. Electronically Signed   By: Kerby Moors M.D.   On: 01/11/2021 13:16

## 2021-01-16 ENCOUNTER — Encounter: Payer: Self-pay | Admitting: Hematology and Oncology

## 2021-01-16 NOTE — Telephone Encounter (Signed)
Called to discuss upcoming surgery with the patient. Informed patient of an opening on June 22. She would like to proceed with this date. Informed of her pre-op appt in the office to discuss the surgery in detail. Advised she will also be receiving a phone call from the [facility] to discuss instructions as well. Advised to please call for any needs or concerns.

## 2021-01-17 ENCOUNTER — Inpatient Hospital Stay (HOSPITAL_BASED_OUTPATIENT_CLINIC_OR_DEPARTMENT_OTHER): Payer: Medicaid Other | Admitting: Gynecologic Oncology

## 2021-01-17 ENCOUNTER — Other Ambulatory Visit: Payer: Self-pay

## 2021-01-17 ENCOUNTER — Other Ambulatory Visit: Payer: Self-pay | Admitting: Gynecologic Oncology

## 2021-01-17 ENCOUNTER — Other Ambulatory Visit (HOSPITAL_COMMUNITY): Payer: Self-pay

## 2021-01-17 ENCOUNTER — Telehealth: Payer: Self-pay

## 2021-01-17 VITALS — BP 117/95 | HR 70 | Temp 97.8°F | Resp 18 | Ht 64.0 in | Wt 193.8 lb

## 2021-01-17 DIAGNOSIS — K56600 Partial intestinal obstruction, unspecified as to cause: Secondary | ICD-10-CM

## 2021-01-17 DIAGNOSIS — C562 Malignant neoplasm of left ovary: Secondary | ICD-10-CM

## 2021-01-17 MED ORDER — OXYCODONE HCL 5 MG PO TABS
5.0000 mg | ORAL_TABLET | ORAL | 0 refills | Status: DC | PRN
Start: 2021-01-17 — End: 2021-02-10
  Filled 2021-01-17: qty 15, 2d supply, fill #0

## 2021-01-17 NOTE — Telephone Encounter (Signed)
Spoke with Lucianne Muss and asked if we could move her appointment with Joylene John, NP to today 6/16 at 1:30 pm. Patient is in agreement.

## 2021-01-17 NOTE — Patient Instructions (Addendum)
Preparing for your Surgery  Plan for surgery on January 23, 2021 with Dr. Jeral Pinch at [facility]. You will be scheduled for a diagnostic laparoscopy, possible robotic assisted lysis of adhesions (taking down scar tissue/adhesions), possible laparotomy (incision on your abdomen), possible bowel resection.   Pre-operative Testing -You will receive a phone call from presurgical testing at [facility] to discuss pre-operative instructions and lab work if needed.  -Bring your insurance card, copy of an advanced directive if applicable, medication list  -At that visit, you will be asked to sign a consent for a possible blood transfusion in case a transfusion becomes necessary during surgery.  The need for a blood transfusion is rare but having consent is a necessary part of your care.     -You should not be taking blood thinners or aspirin at least ten days prior to surgery unless instructed by your surgeon.  -Do not take supplements such as fish oil (omega 3), red yeast rice, turmeric before your surgery. You want to avoid medications with aspirin in them including headache powders such as BC or Goody's), Excedrin migraine.  Day Before Surgery at [place] will be asked to take in a light diet the day before surgery. You will be advised you can have clear liquids up until 3 hours before your surgery.    Eat a light diet the day before surgery.  Examples including soups, broths, toast, yogurt, mashed potatoes.  AVOID GAS PRODUCING FOODS. Things to avoid include carbonated beverages (fizzy beverages, sodas), raw fruits and raw vegetables (uncooked), or beans.   If your bowels are filled with gas, your surgeon will have difficulty visualizing your pelvic organs which increases your surgical risks.  Your role in recovery Your role is to become active as soon as directed by your doctor, while still giving yourself time to heal.  Rest when you feel tired.  You will be asked to do the following in order to speed your recovery:  - Cough and breathe deeply. This helps to clear and expand your lungs and can prevent pneumonia after surgery.  - [place]. Do mild physical activity. Walking or moving your legs help your circulation and body functions return to normal. Do not try to get up or walk alone the first time after surgery.   -If you develop swelling on one leg or the other, pain in the back of your leg, redness/warmth in one of your legs, please call the office or go to the Emergency Room to have a doppler to rule out a blood clot. For shortness of breath, chest pain-seek care in the Emergency Room as soon as possible. - Actively manage your pain. Managing your pain lets you move in comfort. We will ask you to rate your pain on a scale of zero to 10. It is your responsibility to tell your doctor or nurse where and how much you hurt so your pain can be treated.  Special Considerations -If you are diabetic, you may be placed on insulin after surgery to have closer control over your blood sugars to promote healing and recovery.  This does not mean that you will be discharged on insulin.  If applicable, your oral antidiabetics will be resumed when you are tolerating a solid diet.  -Your final pathology results from surgery should be available around one week after surgery and the results will be relayed to you when available.  -FMLA forms can be faxed  to [phone] and please allow 5-7 business days for completion.  Pain Management After Surgery -You have been prescribed your pain medication before surgery so that you can have these available when you are discharged from the hospital. The pain medication is for use ONLY AFTER surgery and a new prescription will not be given.   -Make sure that you have Tylenol at home to use on a regular basis after surgery for pain control.   -Review the attached handout on narcotic use and their  risks and side effects.   Bowel Regimen -Continue your bowel regimen of Miralax and sennakot daily.  Risks of Surgery Risks of surgery are low but include bleeding, infection, damage to surrounding structures, re-operation, blood clots, and very rarely death.   Blood Transfusion Information (For the consent to be signed before surgery)  We will be checking your blood type before surgery so in case of emergencies, we will know what type of blood you would need.                                            WHAT IS A BLOOD TRANSFUSION?  A transfusion is the replacement of blood or some of its parts. Blood is made up of multiple cells which provide different functions. Red blood cells carry oxygen and are used for blood loss replacement. White blood cells fight against infection. Platelets control bleeding. Plasma helps clot blood. Other blood products are available for specialized needs, such as hemophilia or other clotting disorders. BEFORE THE TRANSFUSION  Who gives blood for transfusions?  You may be able to donate blood to be used at a later date on yourself (autologous donation). Relatives can be asked to donate blood. This is generally not any safer than if you have received blood from a stranger. The same precautions are taken to ensure safety when a relative's blood is donated. Healthy volunteers who are fully evaluated to make sure their blood is safe. This is blood bank blood. Transfusion therapy is the safest it has ever been in the practice of medicine. Before blood is taken from a donor, a complete history is taken to make sure that person has no history of diseases nor engages in risky social behavior (examples are intravenous drug use or sexual activity with multiple partners). The donor's travel history is screened to minimize risk of transmitting infections, such as malaria. The donated blood is tested for signs of infectious diseases, such as HIV and hepatitis. The blood is  then tested to be sure it is compatible with you in order to minimize the chance of a transfusion reaction. If you or a relative donates blood, this is often done in anticipation of surgery and is not appropriate for emergency situations. It takes many days to process the donated blood. RISKS AND COMPLICATIONS Although transfusion therapy is very safe and saves many lives, the main dangers of transfusion include:  Getting an infectious disease. Developing a transfusion reaction. This is an allergic reaction to something in the blood you were given. Every precaution is taken to prevent this. The decision to have a blood transfusion has been considered carefully by your caregiver before blood is given. Blood is not given unless the benefits outweigh the risks.  AFTER SURGERY INSTRUCTIONS  Return to work: 4-6 weeks if applicable  Activity: 1. Be up and out of the bed during the day.  Take a nap if needed.  You may walk up steps but be careful and use the hand rail.  Stair climbing will tire you more than you think, you may need to stop part way and rest.   2. No lifting or straining for 6 weeks over 10 pounds. No pushing, pulling, straining for 6 weeks.  3. No driving for around 1 week(s).  Do not drive if you are taking narcotic pain medicine and make sure that your reaction time has returned.   4. You can shower as soon as the next day after surgery. Shower daily.  Use your regular soap and water (not directly on the incision) and pat your incision(s) dry afterwards; don't rub.  No tub baths or submerging your body in water until cleared by your surgeon. If you have the soap that was given to you by pre-surgical testing that was used before surgery, you do not need to use it afterwards because this can irritate your incisions.   5. No sexual activity and nothing in the vagina for 2 weeks.  6. You may experience a small amount of clear drainage from your incisions, which is normal.  If the  drainage persists, increases, or changes color please call the office.  7. Do not use creams, lotions, or ointments such as neosporin on your incisions after surgery until advised by your surgeon because they can cause removal of the dermabond glue on your incisions.    8. Take Tylenol or ibuprofen first for pain and only use narcotic pain medication for severe pain not relieved by the Tylenol or Ibuprofen.  Monitor your Tylenol intake to a max of 4,000 mg in a 24 hour period. You can alternate these medications after surgery.  Diet: 1. Low sodium Heart Healthy Diet is recommended but you are cleared to resume your normal (before surgery) diet after your procedure.  2. It is safe to use a laxative, such as Miralax or Colace, if you have difficulty moving your bowels. Continue your bowel regimen with Miralax and sennakot.  Wound Care: 1. Keep clean and dry.  Shower daily.  Reasons to call the Doctor: Fever - Oral temperature greater than 100.4 degrees Fahrenheit Foul-smelling vaginal discharge Difficulty urinating Nausea and vomiting Increased pain at the site of the incision that is unrelieved with pain medicine. Difficulty breathing with or without chest pain New calf pain especially if only on one side Sudden, continuing increased vaginal bleeding with or without clots.   Contacts: For questions or concerns you should contact:  Dr. Jeral Pinch at [phone]  Joylene John, NP at [phone]  After Hours: call [phone] and have the GYN Oncologist paged/contacted (after 5 pm or on the weekends).  Messages sent via mychart are for non-urgent matters and are not responded to after hours so for urgent needs, please call the after hours number.

## 2021-01-17 NOTE — Progress Notes (Signed)
Patient here for a pre-operative appointment prior to her scheduled surgery on January 23, 2021. She is scheduled for a diagnostic laparoscopy, possible robotic assisted lysis of adhesions (taking down scar tissue/adhesions), possible laparotomy (incision on your abdomen), possible bowel resection. The surgery was discussed in detail by myself and Dr. Berline Lopes.  See after visit summary for additional details.       Discussed post-op pain management in detail including the aspects of the enhanced recovery pathway.  Advised her that a new prescription would be sent in for oxycodone and it is only to be used for after her upcoming surgery.  We discussed the use of tylenol post-op and to monitor for a maximum of 4,000 mg in a 24 hour period.  Also prescribed sennakot to be used after surgery and to hold if having loose stools.  Discussed bowel regimen in detail with plans for her to continue her home regimen of Miralax and Senna-kot. She does not use NSAIDS due to past hx of an ulcer.     Discussed measures to take at home to prevent DVT including frequent mobility.  Reportable signs and symptoms of DVT discussed. Post-operative instructions discussed and expectations for after surgery. Incisional care discussed as well including reportable signs and symptoms including erythema, drainage, wound separation.    10 minutes spent with the patient.  Verbalizing understanding of material discussed. No needs or concerns voiced at the end of the visit.   Advised patient and family to call for any needs.  Advised that her post-operative medications had been prescribed and could be picked up at any time.     This appt is included in the global surgical fee with no charge for today's pre-op visit.

## 2021-01-18 ENCOUNTER — Encounter (HOSPITAL_BASED_OUTPATIENT_CLINIC_OR_DEPARTMENT_OTHER): Payer: Self-pay | Admitting: Gynecologic Oncology

## 2021-01-21 ENCOUNTER — Encounter (HOSPITAL_BASED_OUTPATIENT_CLINIC_OR_DEPARTMENT_OTHER): Payer: Self-pay | Admitting: Gynecologic Oncology

## 2021-01-21 ENCOUNTER — Other Ambulatory Visit: Payer: Self-pay

## 2021-01-21 NOTE — Progress Notes (Signed)
Spoke w/ via phone for pre-op interview--- Pt Lab needs dos----  no             Lab results------ current labs done 01-15-2021, CBCdiff/ CMP, results in epic;  current ekg in epic/ chart COVID test -----patient states asymptomatic no test needed Arrive at ------- 0800 on 01-23-2021 NPO after MN NO Solid Food.  Clear liquids from MN until--- 0700  (pt also aware to start light diet day before surgery ) Med rec completed Medications to take morning of surgery ----- may take zofran if needed  Diabetic medication ----- do not take metformin morning of surgery Patient instructed no nail polish to be worn day of surgery Patient instructed to bring photo id and insurance card day of surgery Patient aware to have Driver (ride ) / caregiver for 24 hours after surgery -- husband, Tawana Scale Patient Special Instructions ----- n/a Pre-Op special Istructions ----- per pt last chemo 12-18-2020, no istat needed Patient verbalized understanding of instructions that were given at this phone interview. Patient denies shortness of breath, chest pain, fever, cough at this phone interview.

## 2021-01-22 ENCOUNTER — Telehealth: Payer: Self-pay

## 2021-01-22 NOTE — Telephone Encounter (Signed)
Telephone call to check on pre-operative status.  Patient compliant with pre-operative instructions.  Reinforced NPO after midnight.  No questions or concerns voiced.  Instructed to call for any needs.   

## 2021-01-23 ENCOUNTER — Other Ambulatory Visit: Payer: Self-pay

## 2021-01-23 ENCOUNTER — Ambulatory Visit (HOSPITAL_BASED_OUTPATIENT_CLINIC_OR_DEPARTMENT_OTHER): Payer: Medicaid Other | Admitting: Anesthesiology

## 2021-01-23 ENCOUNTER — Encounter (HOSPITAL_BASED_OUTPATIENT_CLINIC_OR_DEPARTMENT_OTHER): Payer: Self-pay | Admitting: Gynecologic Oncology

## 2021-01-23 ENCOUNTER — Inpatient Hospital Stay (HOSPITAL_BASED_OUTPATIENT_CLINIC_OR_DEPARTMENT_OTHER)
Admission: RE | Admit: 2021-01-23 | Discharge: 2021-01-25 | DRG: 330 | Disposition: A | Discharge status: Home / Self Care | Payer: Medicaid Other | Attending: Gynecologic Oncology | Admitting: Gynecologic Oncology

## 2021-01-23 ENCOUNTER — Encounter (HOSPITAL_COMMUNITY)
Admission: RE | Disposition: A | Discharge status: Home / Self Care | Payer: Self-pay | Source: Home / Self Care | Attending: Gynecologic Oncology

## 2021-01-23 DIAGNOSIS — K660 Peritoneal adhesions (postprocedural) (postinfection): Secondary | ICD-10-CM | POA: Diagnosis present

## 2021-01-23 DIAGNOSIS — Z888 Allergy status to other drugs, medicaments and biological substances status: Secondary | ICD-10-CM | POA: Diagnosis not present

## 2021-01-23 DIAGNOSIS — E099 Drug or chemical induced diabetes mellitus without complications: Secondary | ICD-10-CM | POA: Diagnosis present

## 2021-01-23 DIAGNOSIS — K56609 Unspecified intestinal obstruction, unspecified as to partial versus complete obstruction: Secondary | ICD-10-CM

## 2021-01-23 DIAGNOSIS — Y838 Other surgical procedures as the cause of abnormal reaction of the patient, or of later complication, without mention of misadventure at the time of the procedure: Secondary | ICD-10-CM | POA: Diagnosis not present

## 2021-01-23 DIAGNOSIS — Z8616 Personal history of COVID-19: Secondary | ICD-10-CM | POA: Diagnosis not present

## 2021-01-23 DIAGNOSIS — T380X5A Adverse effect of glucocorticoids and synthetic analogues, initial encounter: Secondary | ICD-10-CM | POA: Diagnosis present

## 2021-01-23 DIAGNOSIS — Z8041 Family history of malignant neoplasm of ovary: Secondary | ICD-10-CM | POA: Diagnosis not present

## 2021-01-23 DIAGNOSIS — Z7984 Long term (current) use of oral hypoglycemic drugs: Secondary | ICD-10-CM

## 2021-01-23 DIAGNOSIS — K5651 Intestinal adhesions [bands], with partial obstruction: Principal | ICD-10-CM | POA: Diagnosis present

## 2021-01-23 DIAGNOSIS — J45909 Unspecified asthma, uncomplicated: Secondary | ICD-10-CM | POA: Diagnosis present

## 2021-01-23 DIAGNOSIS — Z8543 Personal history of malignant neoplasm of ovary: Secondary | ICD-10-CM

## 2021-01-23 DIAGNOSIS — Z803 Family history of malignant neoplasm of breast: Secondary | ICD-10-CM

## 2021-01-23 DIAGNOSIS — K56600 Partial intestinal obstruction, unspecified as to cause: Secondary | ICD-10-CM | POA: Diagnosis present

## 2021-01-23 DIAGNOSIS — K565 Intestinal adhesions [bands], unspecified as to partial versus complete obstruction: Secondary | ICD-10-CM | POA: Diagnosis present

## 2021-01-23 DIAGNOSIS — Z8042 Family history of malignant neoplasm of prostate: Secondary | ICD-10-CM

## 2021-01-23 DIAGNOSIS — C562 Malignant neoplasm of left ovary: Secondary | ICD-10-CM

## 2021-01-23 DIAGNOSIS — Z9221 Personal history of antineoplastic chemotherapy: Secondary | ICD-10-CM

## 2021-01-23 DIAGNOSIS — M1990 Unspecified osteoarthritis, unspecified site: Secondary | ICD-10-CM | POA: Diagnosis present

## 2021-01-23 DIAGNOSIS — K9171 Accidental puncture and laceration of a digestive system organ or structure during a digestive system procedure: Secondary | ICD-10-CM | POA: Diagnosis not present

## 2021-01-23 DIAGNOSIS — Z800 Family history of malignant neoplasm of digestive organs: Secondary | ICD-10-CM

## 2021-01-23 DIAGNOSIS — C569 Malignant neoplasm of unspecified ovary: Secondary | ICD-10-CM

## 2021-01-23 DIAGNOSIS — C786 Secondary malignant neoplasm of retroperitoneum and peritoneum: Secondary | ICD-10-CM | POA: Diagnosis present

## 2021-01-23 HISTORY — PX: LAPAROTOMY: SHX154

## 2021-01-23 HISTORY — DX: Unspecified osteoarthritis, unspecified site: M19.90

## 2021-01-23 HISTORY — DX: Nausea: R11.0

## 2021-01-23 HISTORY — PX: LAPAROSCOPY: SHX197

## 2021-01-23 HISTORY — DX: Drug or chemical induced diabetes mellitus without complications: T38.0X5A

## 2021-01-23 HISTORY — DX: Drug or chemical induced diabetes mellitus without complications: E09.9

## 2021-01-23 HISTORY — DX: Unspecified asthma, uncomplicated: J45.909

## 2021-01-23 HISTORY — DX: Anemia due to antineoplastic chemotherapy: T45.1X5A

## 2021-01-23 HISTORY — DX: Anemia due to antineoplastic chemotherapy: D64.81

## 2021-01-23 LAB — GLUCOSE, CAPILLARY
Glucose-Capillary: 120 mg/dL — ABNORMAL HIGH (ref 70–99)
Glucose-Capillary: 153 mg/dL — ABNORMAL HIGH (ref 70–99)
Glucose-Capillary: 210 mg/dL — ABNORMAL HIGH (ref 70–99)

## 2021-01-23 LAB — TYPE AND SCREEN
ABO/RH(D): O POS
Antibody Screen: NEGATIVE

## 2021-01-23 LAB — POCT PREGNANCY, URINE: Preg Test, Ur: NEGATIVE

## 2021-01-23 SURGERY — LAPAROSCOPY, DIAGNOSTIC
Anesthesia: General | Site: Abdomen

## 2021-01-23 MED ORDER — LIDOCAINE HCL (PF) 2 % IJ SOLN
INTRAMUSCULAR | Status: AC
  Filled 2021-01-23: qty 5

## 2021-01-23 MED ORDER — FENTANYL CITRATE (PF) 100 MCG/2ML IJ SOLN
INTRAMUSCULAR | Status: AC
  Filled 2021-01-23: qty 2

## 2021-01-23 MED ORDER — SODIUM CHLORIDE 0.9 % IV SOLN
1000.0000 mg | Freq: Once | INTRAVENOUS | Status: DC
  Filled 2021-01-23: qty 1

## 2021-01-23 MED ORDER — PROPOFOL 10 MG/ML IV BOLUS
INTRAVENOUS | Status: DC | PRN
  Administered 2021-01-23: 170 mg via INTRAVENOUS
  Administered 2021-01-23: 30 mg via INTRAVENOUS

## 2021-01-23 MED ORDER — ACETAMINOPHEN 500 MG PO TABS
1000.0000 mg | ORAL_TABLET | Freq: Four times a day (QID) | ORAL | Status: DC
Start: 2021-01-23 — End: 2021-01-25
  Administered 2021-01-23 – 2021-01-25 (×7): 1000 mg via ORAL
  Filled 2021-01-23 (×7): qty 2

## 2021-01-23 MED ORDER — OXYCODONE HCL 5 MG PO TABS
5.0000 mg | ORAL_TABLET | Freq: Once | ORAL | Status: DC | PRN
Start: 2021-01-23 — End: 2021-01-23

## 2021-01-23 MED ORDER — EPHEDRINE SULFATE 50 MG/ML IJ SOLN
INTRAMUSCULAR | Status: DC | PRN
  Administered 2021-01-23: 10 mg via INTRAVENOUS

## 2021-01-23 MED ORDER — ENOXAPARIN SODIUM 40 MG/0.4ML IJ SOSY
40.0000 mg | PREFILLED_SYRINGE | INTRAMUSCULAR | Status: DC
  Administered 2021-01-24 – 2021-01-25 (×2): 40 mg via SUBCUTANEOUS
  Filled 2021-01-23 (×2): qty 0.4

## 2021-01-23 MED ORDER — LIDOCAINE 2% (20 MG/ML) 5 ML SYRINGE
INTRAMUSCULAR | Status: DC | PRN
  Administered 2021-01-23: 70 mg via INTRAVENOUS

## 2021-01-23 MED ORDER — PROPOFOL 10 MG/ML IV BOLUS
INTRAVENOUS | Status: AC
  Filled 2021-01-23: qty 20

## 2021-01-23 MED ORDER — ONDANSETRON HCL 4 MG/2ML IJ SOLN
INTRAMUSCULAR | Status: AC
  Filled 2021-01-23: qty 2

## 2021-01-23 MED ORDER — SUGAMMADEX SODIUM 200 MG/2ML IV SOLN
INTRAVENOUS | Status: DC | PRN
  Administered 2021-01-23: 200 mg via INTRAVENOUS

## 2021-01-23 MED ORDER — SODIUM CHLORIDE 0.9 % IR SOLN
Status: DC | PRN
  Administered 2021-01-23: 3000 mL

## 2021-01-23 MED ORDER — ACETAMINOPHEN 500 MG PO TABS
1000.0000 mg | ORAL_TABLET | ORAL | Status: AC
Start: 2021-01-23 — End: 2021-01-23
  Administered 2021-01-23: 1000 mg via ORAL

## 2021-01-23 MED ORDER — FENTANYL CITRATE (PF) 250 MCG/5ML IJ SOLN
INTRAMUSCULAR | Status: AC
  Filled 2021-01-23: qty 5

## 2021-01-23 MED ORDER — MIDAZOLAM HCL 2 MG/2ML IJ SOLN
INTRAMUSCULAR | Status: AC
  Filled 2021-01-23: qty 2

## 2021-01-23 MED ORDER — CELECOXIB 200 MG PO CAPS
400.0000 mg | ORAL_CAPSULE | ORAL | Status: AC
  Administered 2021-01-23: 400 mg via ORAL

## 2021-01-23 MED ORDER — CEFAZOLIN SODIUM-DEXTROSE 2-3 GM-%(50ML) IV SOLR
INTRAVENOUS | Status: DC | PRN
  Administered 2021-01-23: 2 g via INTRAVENOUS

## 2021-01-23 MED ORDER — ROCURONIUM BROMIDE 10 MG/ML (PF) SYRINGE
PREFILLED_SYRINGE | INTRAVENOUS | Status: DC | PRN
  Administered 2021-01-23: 50 mg via INTRAVENOUS

## 2021-01-23 MED ORDER — INSULIN ASPART 100 UNIT/ML IJ SOLN
0.0000 [IU] | Freq: Three times a day (TID) | INTRAMUSCULAR | Status: DC
Start: 2021-01-23 — End: 2021-01-25

## 2021-01-23 MED ORDER — DEXMEDETOMIDINE (PRECEDEX) IN NS 20 MCG/5ML (4 MCG/ML) IV SYRINGE
PREFILLED_SYRINGE | INTRAVENOUS | Status: AC
  Filled 2021-01-23: qty 5

## 2021-01-23 MED ORDER — OXYCODONE HCL 5 MG/5ML PO SOLN: 5.0000 mg | Freq: Once | ORAL | Status: DC | PRN

## 2021-01-23 MED ORDER — BUPIVACAINE HCL (PF) 0.25 % IJ SOLN
INTRAMUSCULAR | Status: DC | PRN
  Administered 2021-01-23: 10 mL
  Administered 2021-01-23: 30 mL

## 2021-01-23 MED ORDER — ROCURONIUM BROMIDE 10 MG/ML (PF) SYRINGE
PREFILLED_SYRINGE | INTRAVENOUS | Status: AC
  Filled 2021-01-23: qty 10

## 2021-01-23 MED ORDER — HYDROMORPHONE HCL 1 MG/ML IJ SOLN
INTRAMUSCULAR | Status: AC
  Filled 2021-01-23: qty 1

## 2021-01-23 MED ORDER — FENTANYL CITRATE (PF) 100 MCG/2ML IJ SOLN
25.0000 ug | INTRAMUSCULAR | Status: DC | PRN
  Administered 2021-01-23 (×4): 25 ug via INTRAVENOUS

## 2021-01-23 MED ORDER — CELECOXIB 200 MG PO CAPS
ORAL_CAPSULE | ORAL | Status: AC
  Filled 2021-01-23: qty 2

## 2021-01-23 MED ORDER — GABAPENTIN 300 MG PO CAPS
300.0000 mg | ORAL_CAPSULE | ORAL | Status: AC
  Administered 2021-01-23: 300 mg via ORAL

## 2021-01-23 MED ORDER — GABAPENTIN 100 MG PO CAPS
100.0000 mg | ORAL_CAPSULE | Freq: Two times a day (BID) | ORAL | Status: DC
  Administered 2021-01-23 – 2021-01-25 (×4): 100 mg via ORAL
  Filled 2021-01-23 (×4): qty 1

## 2021-01-23 MED ORDER — HYDROMORPHONE HCL 1 MG/ML IJ SOLN
0.2500 mg | INTRAMUSCULAR | Status: DC | PRN
Start: 2021-01-23 — End: 2021-01-23
  Administered 2021-01-23 (×4): 0.5 mg via INTRAVENOUS

## 2021-01-23 MED ORDER — SODIUM CHLORIDE 0.9 % IV SOLN
1.0000 g | Freq: Once | INTRAVENOUS | Status: DC
  Filled 2021-01-23: qty 1

## 2021-01-23 MED ORDER — OXYCODONE HCL 5 MG PO TABS
5.0000 mg | ORAL_TABLET | ORAL | Status: DC | PRN
  Administered 2021-01-23 – 2021-01-25 (×9): 10 mg via ORAL
  Filled 2021-01-23 (×9): qty 2

## 2021-01-23 MED ORDER — SCOPOLAMINE 1 MG/3DAYS TD PT72
1.0000 | MEDICATED_PATCH | TRANSDERMAL | Status: DC
Start: 2021-01-23 — End: 2021-01-23
  Administered 2021-01-23: 1.5 mg via TRANSDERMAL

## 2021-01-23 MED ORDER — ONDANSETRON HCL 4 MG/2ML IJ SOLN
INTRAMUSCULAR | Status: DC | PRN
  Administered 2021-01-23: 4 mg via INTRAVENOUS

## 2021-01-23 MED ORDER — ONDANSETRON HCL 4 MG PO TABS
4.0000 mg | ORAL_TABLET | Freq: Four times a day (QID) | ORAL | Status: DC | PRN
Start: 2021-01-23 — End: 2021-01-25

## 2021-01-23 MED ORDER — DEXAMETHASONE SODIUM PHOSPHATE 10 MG/ML IJ SOLN
INTRAMUSCULAR | Status: AC
  Filled 2021-01-23: qty 1

## 2021-01-23 MED ORDER — ONDANSETRON HCL 4 MG/2ML IJ SOLN: 4.0000 mg | Freq: Four times a day (QID) | INTRAMUSCULAR | Status: DC | PRN

## 2021-01-23 MED ORDER — DEXAMETHASONE SODIUM PHOSPHATE 10 MG/ML IJ SOLN
INTRAMUSCULAR | Status: DC | PRN
  Administered 2021-01-23: 8 mg via INTRAVENOUS

## 2021-01-23 MED ORDER — CEFAZOLIN SODIUM 1 G IJ SOLR
INTRAMUSCULAR | Status: AC
  Filled 2021-01-23: qty 20

## 2021-01-23 MED ORDER — TRAMADOL HCL 50 MG PO TABS
50.0000 mg | ORAL_TABLET | Freq: Four times a day (QID) | ORAL | Status: DC | PRN
  Administered 2021-01-23: 50 mg via ORAL
  Filled 2021-01-23: qty 1

## 2021-01-23 MED ORDER — HEPARIN SODIUM (PORCINE) 5000 UNIT/ML IJ SOLN
INTRAMUSCULAR | Status: AC
  Filled 2021-01-23: qty 1

## 2021-01-23 MED ORDER — SCOPOLAMINE 1 MG/3DAYS TD PT72
MEDICATED_PATCH | TRANSDERMAL | Status: AC
  Filled 2021-01-23: qty 1

## 2021-01-23 MED ORDER — MIDAZOLAM HCL 5 MG/5ML IJ SOLN
INTRAMUSCULAR | Status: DC | PRN
  Administered 2021-01-23: 2 mg via INTRAVENOUS

## 2021-01-23 MED ORDER — SODIUM CHLORIDE 0.9 % IV SOLN
INTRAVENOUS | Status: DC | PRN
  Administered 2021-01-23: 1 g via INTRAVENOUS

## 2021-01-23 MED ORDER — HYDROMORPHONE HCL 1 MG/ML IJ SOLN: 0.2000 mg | INTRAMUSCULAR | Status: DC | PRN

## 2021-01-23 MED ORDER — DEXMEDETOMIDINE (PRECEDEX) IN NS 20 MCG/5ML (4 MCG/ML) IV SYRINGE
PREFILLED_SYRINGE | INTRAVENOUS | Status: DC | PRN
  Administered 2021-01-23: 8 ug via INTRAVENOUS
  Administered 2021-01-23: 12 ug via INTRAVENOUS

## 2021-01-23 MED ORDER — IBUPROFEN 400 MG PO TABS
800.0000 mg | ORAL_TABLET | Freq: Three times a day (TID) | ORAL | Status: DC
Start: 2021-01-23 — End: 2021-01-25
  Administered 2021-01-23: 800 mg via ORAL
  Filled 2021-01-23 (×4): qty 2

## 2021-01-23 MED ORDER — GABAPENTIN 300 MG PO CAPS
ORAL_CAPSULE | ORAL | Status: AC
  Filled 2021-01-23: qty 1

## 2021-01-23 MED ORDER — ACETAMINOPHEN 500 MG PO TABS
ORAL_TABLET | ORAL | Status: AC
  Filled 2021-01-23: qty 2

## 2021-01-23 MED ORDER — HEPARIN SODIUM (PORCINE) 5000 UNIT/ML IJ SOLN
5000.0000 [IU] | INTRAMUSCULAR | Status: AC
Start: 2021-01-23 — End: 2021-01-23
  Administered 2021-01-23: 5000 [IU] via SUBCUTANEOUS

## 2021-01-23 MED ORDER — KCL IN DEXTROSE-NACL 20-5-0.45 MEQ/L-%-% IV SOLN
INTRAVENOUS | Status: DC
  Filled 2021-01-23: qty 1000

## 2021-01-23 MED ORDER — FENTANYL CITRATE (PF) 100 MCG/2ML IJ SOLN
INTRAMUSCULAR | Status: DC | PRN
  Administered 2021-01-23: 50 ug via INTRAVENOUS
  Administered 2021-01-23: 100 ug via INTRAVENOUS
  Administered 2021-01-23 (×2): 50 ug via INTRAVENOUS

## 2021-01-23 MED ORDER — LACTATED RINGERS IV SOLN: INTRAVENOUS | Status: DC

## 2021-01-23 MED ORDER — PROMETHAZINE HCL 25 MG/ML IJ SOLN: 6.2500 mg | INTRAMUSCULAR | Status: DC | PRN

## 2021-01-23 MED ORDER — DEXAMETHASONE SODIUM PHOSPHATE 10 MG/ML IJ SOLN
4.0000 mg | INTRAMUSCULAR | Status: DC
Start: 2021-01-23 — End: 2021-01-23

## 2021-01-23 SURGICAL SUPPLY — 53 items
BLADE SURG 10 STRL SS (BLADE) ×3 IMPLANT
CHLORAPREP W/TINT 26 (MISCELLANEOUS) ×6 IMPLANT
CLEANER CAUTERY TIP 5X5 PAD (MISCELLANEOUS) ×2 IMPLANT
COVER BACK TABLE 60X90IN (DRAPES) ×3 IMPLANT
COVER WAND RF STERILE (DRAPES) ×3 IMPLANT
DERMABOND ADVANCED (GAUZE/BANDAGES/DRESSINGS) ×1
DERMABOND ADVANCED .7 DNX12 (GAUZE/BANDAGES/DRESSINGS) ×2 IMPLANT
DRAPE SURG IRRIG POUCH 19X23 (DRAPES) ×3 IMPLANT
DRAPE WARM FLUID 44X44 (DRAPES) ×3 IMPLANT
DRSG OPSITE POSTOP 4X10 (GAUZE/BANDAGES/DRESSINGS) ×3 IMPLANT
ELECT REM PT RETURN 9FT ADLT (ELECTROSURGICAL) ×6
ELECTRODE REM PT RTRN 9FT ADLT (ELECTROSURGICAL) ×4 IMPLANT
GAUZE 4X4 16PLY RFD (DISPOSABLE) ×3 IMPLANT
GLOVE SURG ENC MOIS LTX SZ6 (GLOVE) ×12 IMPLANT
GLOVE SURG ENC MOIS LTX SZ6.5 (GLOVE) ×6 IMPLANT
GLOVE SURG UNDER POLY LF SZ6.5 (GLOVE) ×3 IMPLANT
GLOVE SURG UNDER POLY LF SZ7 (GLOVE) ×12 IMPLANT
GOWN STRL REUS W/TWL LRG LVL3 (GOWN DISPOSABLE) ×6 IMPLANT
HOLDER FOLEY CATH W/STRAP (MISCELLANEOUS) ×3 IMPLANT
IRRIG SUCT STRYKERFLOW 2 WTIP (MISCELLANEOUS) ×3
IRRIGATION SUCT STRKRFLW 2 WTP (MISCELLANEOUS) ×2 IMPLANT
KIT TURNOVER CYSTO (KITS) ×3 IMPLANT
NEEDLE HYPO 22GX1.5 SAFETY (NEEDLE) ×3 IMPLANT
NS IRRIG 1000ML POUR BTL (IV SOLUTION) ×6 IMPLANT
PACK ROBOT GYN CUSTOM WL (TRAY / TRAY PROCEDURE) ×3 IMPLANT
PACK ROBOTIC GOWN (GOWN DISPOSABLE) ×3 IMPLANT
PAD CLEANER CAUTERY TIP 5X5 (MISCELLANEOUS) ×1
PAD POSITIONING PINK XL (MISCELLANEOUS) ×3 IMPLANT
PENCIL BUTTON HOLSTER BLD 10FT (ELECTRODE) ×3 IMPLANT
SET TRI-LUMEN FLTR TB AIRSEAL (TUBING) ×3 IMPLANT
SPONGE LAP 18X18 RF (DISPOSABLE) ×9 IMPLANT
SURGIFLO W/THROMBIN 8M KIT (HEMOSTASIS) IMPLANT
SUT MNCRL AB 4-0 PS2 18 (SUTURE) ×6 IMPLANT
SUT PDS AB 0 CTX 60 (SUTURE) ×6 IMPLANT
SUT VIC AB 0 CT1 27 (SUTURE)
SUT VIC AB 0 CT1 27XBRD ANTBC (SUTURE) IMPLANT
SUT VIC AB 2-0 CT1 (SUTURE) ×3 IMPLANT
SUT VIC AB 2-0 SH 27 (SUTURE) ×4
SUT VIC AB 2-0 SH 27XBRD (SUTURE) ×8 IMPLANT
SUT VIC AB 4-0 PS2 18 (SUTURE) ×6 IMPLANT
SUT VIC AB 4-0 PS2 27 (SUTURE) ×3 IMPLANT
SYR 10ML LL (SYRINGE) IMPLANT
SYR CONTROL 10ML LL (SYRINGE) IMPLANT
TOWEL OR 17X26 10 PK STRL BLUE (TOWEL DISPOSABLE) IMPLANT
TRAP SPECIMEN MUCUS 40CC (MISCELLANEOUS) IMPLANT
TRAY DSU PREP LF (CUSTOM PROCEDURE TRAY) ×3 IMPLANT
TRAY FOLEY W/BAG SLVR 14FR LF (SET/KITS/TRAYS/PACK) ×3 IMPLANT
TROCAR BLADELESS OPT 5 100 (ENDOMECHANICALS) ×3 IMPLANT
TROCAR XCEL BLADELESS 5X75MML (TROCAR) ×6 IMPLANT
TUBE CONNECTING 12X1/4 (SUCTIONS) ×3 IMPLANT
UNDERPAD 30X36 HEAVY ABSORB (UNDERPADS AND DIAPERS) ×3 IMPLANT
WATER STERILE IRR 1000ML POUR (IV SOLUTION) ×3 IMPLANT
YANKAUER SUCT BULB TIP NO VENT (SUCTIONS) ×3 IMPLANT

## 2021-01-23 NOTE — Op Note (Signed)
PATIENT: Sarah Newman DATE: 01/23/21  Preop Diagnosis: symptoms of intermittent partial SBO in the setting of recurrent ovarian cancer without obvious disease on imaging and normal CA-125  Postoperative Diagnosis: significant adhesions of small bowel to the anterior abdominal wall along prior incision  Surgery: Diagnostic laparoscopy, exploratory laparotomy, unavoidable enterotomy with repair  Surgeons:  Valarie Cones, MD  Assistant: Joylene John, NP  Anesthesia: General   Estimated blood loss: 100 ml  IVF: see I&O flowsheet  Urine output:  50 ml   Complications: Unavoidable enterotomy   Pathology: n/a  Operative findings: On pelvic exam, smooth cuff, no masses or nodularity. Significant stool burden in rectum (disimpacted). On diagnostic laparoscopy, thick adhesions between loops of small bowel and the anterior abdominal wall along entire prior incision. Two small areas that appeared consistent with treatment disease on bowel and mesentery. Unable to see safe plane so decision made to convert to open. On exploratory laparotomy, small bowel loops densely adherent along the anterior abdominal wall along prior incision. During initial attempt to dissect bowel from fascia, unavoidable enterotomy made (1cm). Given what was going to be an extensive surgery to undertake, likely needing multiple bowel resections and case inappropriate to be performed at the surgery center, decision made to complete surgery at this time.   Procedure: The patient was identified in the preoperative holding area. Informed consent was signed on the chart. Patient was seen history was reviewed and exam was performed.   The patient was then taken to the operating room and placed in the supine position with SCD hose on. General anesthesia was then induced without difficulty. She was then placed in the dorsolithotomy position. The abdomen was prepped with chlor prep sponges per protocol. Perineum was  prepped with Betadine. The vagina was prepped with Betadine a Foley catheter was inserted into the bladder under sterile conditions.  The patient was then draped after the prep was dried. Timeout was performed the patient, procedure, antibiotic, allergy, and length of procedure. After injection of 0.25% marcaine, a 50mm incision was made in the left upper quadrant, superior and lateral to prior midline incision. Intra-abdominal access was then achieved with the 67mm visiport under direct visualization. The abdomen was insufflated to 16mm Hg with findings noted above. Two additional 63mm ports were placed on the left. Attempt with blunt dissection and sharp dissection to lyse adhesions of the small bowel to the anterior abdomen was performed with no safe plane appreciable and the decision made to convert to open.  A vertical midline periumbilical incision was and carried down to the underlying fascia using the scalpel. The fascia was elevated and incised sharply. The peritoneum was incised once identified sharply, just superior to the start of the anterior abdominal wall adhesions. The surgeon's hand was then placed within the abdominal cavity and the bowel was held to the right. The fascia and peritoneum were opened lateral to the bowel adhesions on the left of the prior scar. Once the abdominal incision allowed for sufficient visualization, attention was then turned to lysis of adhesion along the prior scar. Using slow sharp dissection, lysis of adhesion was started. Very quickly, a 1cm enterotomy was noted (unavoidable.). At this time, given extent of adhesions and surgery being performed at outpatient surgery center, decision made to abort the procedure. 2-0 Vicryl was used in interrupted fashion to close the small bowel enterotomy. First, a hemostat was used to outline the bowel segment direction superior and inferior to the enterotomy to assure that the bowel  was not narrowed upon closure. The incision was  then closed with interrupted 2-0 Vicryl sutures perpendicular to the long access of the bowel to assure no narrowing of the lumen. A second layer with interrupted 2-0 Vicryl was used to imbricated the incision. 1gm of ertapenem was given.  The surgeon's gloves were changed. The abdomen pelvis were irrigated. The fascia was closed using running mass closure of #0 PDS. The subcutaneous tissues were irrigated and made hemostatic. 20 mL of 0.25% marcaine was injected for postoperative pain control. The subcutaneous tissue was reapproximated with a deep layer of 2-0 Vicryl and superficial layer of 4-0 Vicryl. The skin was closed using 4-0 Monocryl.  All instrument, suture, laparotomy, Ray-Tec, and needle counts were correct x2. The patient tolerated the procedure well and was taken recovery room in stable condition.   Jeral Pinch MD Gynecologic Oncology

## 2021-01-23 NOTE — H&P (Signed)
GYNECOLOGIC ONCOLOGY H&P  01/23/21  Assessment/Plan:  Recurrent ovarian cancer with no clear evidence of active disease with intermittent symptoms c/w partial SBO.  Discussed possible causes of symptoms including adhesions from prior surgery. Given imaging and CA-125, offered diagnostic scope to assess for cause of symptoms that may be amenable to surgical correction. Patient wanted to move forward with surgery, presents today for procedures.  Jeral Pinch, MD  Division of Gynecologic Oncology  ___________________________________________  Chief Complaint: No chief complaint on file.   History of Present Illness:  Sarah Newman is a 45 y.o. y.o. female with recurrent ovarian cancer with no significant findings of cancer burden on CT imaging and normal tumor markers who has had recent intermittent symptoms c/w partial SBO.  PAST MEDICAL HISTORY:  Past Medical History:  Diagnosis Date   Anemia due to antineoplastic chemotherapy    Arthritis    Asthma due to seasonal allergies    01-21-2021 per pt seasonal and exercised induced, does not have inhaler, last used one few years   Chronic abdominal pain 01/2021   due to partial bowel obstruction   Chronic constipation 01/2021   due to partial bowel obstruction   Chronic nausea    01-21-2021  per pt intermittant nausea and when has abd pain most of time will have vomiting   Family history of breast cancer    Family history of colon cancer    Family history of ovarian cancer    Family history of prostate cancer    History of 2019 novel coronavirus disease (COVID-19) 09/08/2020   positive results in epic, per pt mild symptoms that resolved   History of Helicobacter pylori infection 07/2016   EGD w/ bx's by [facility],  chronic h.pylori w/ gastritis,  treated  (no ulcer)   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 lap w/ TAH/ BSO, Stage IIIA2 ovarian carcinoma, started  chemo 08-22-2019;  progression w/ abdominal carcinomatosis   Partial bowel obstruction ([place]) 01/2021   Steroid-induced diabetes ([place])    followed by dr Alvy Bimler     PAST SURGICAL HISTORY:  Past Surgical History:  Procedure Laterality Date   CHOLECYSTECTOMY N/A 02/16/2015   Procedure: LAPAROSCOPIC CHOLECYSTECTOMY WITH INTRAOPERATIVE CHOLANGIOGRAM;  Surgeon: Johnathan Hausen, MD;  Location: [facility];  Service: General;  Laterality: N/A;   IR IMAGING GUIDED PORT INSERTION  08/12/2019   SALPINGOOPHORECTOMY N/A 07/21/2019   Procedure: EXPLORATORY LAPAROTOMY, TOTAL ABDOMINAL HYSTERECTOMY, BILATERAL SALPINGO OOPHORECTOMY, OMENTECTOMY;  Surgeon: Lafonda Mosses, MD;  Location: [facility];  Service: Gynecology;  Laterality: N/A;   UPPER GASTROINTESTINAL ENDOSCOPY  08/01/2016   by dr Corinda Gubler in epic    OB/GYN HISTORY:  OB History  Gravida Para Term Preterm AB Living  3 3       2   SAB IAB Ectopic Multiple Live Births          2    # Outcome Date GA Lbr Len/2nd Weight Sex Delivery Anes PTL Lv  3 Para           2 Para           1 Para             Patient's last menstrual period was 07/05/2019.   MEDICATIONS: Scheduled Meds:  dexamethasone (DECADRON) injection  4 mg Intravenous On Call to OR   scopolamine  1 patch Transdermal On Call to OR   Continuous Infusions:  lactated ringers 50 mL/hr at 01/23/21 [phone]  PRN Meds:.   ALLERGIES:  Allergies  Allergen Reactions   Carboplatin Shortness Of Breath     FAMILY HISTORY:  Family History  Problem Relation Age of Onset   Colon cancer Father 56   Prostate cancer Father 20   Colon cancer Paternal Aunt        dx. early 34s   Ovarian cancer Paternal Grandmother 92   Breast cancer Cousin    Endometriosis Mother    Cancer Paternal Uncle        unknown type, dx. early 84s   Cancer Paternal Uncle 14       unknown type   Esophageal cancer Neg Hx      SOCIAL HISTORY:  Social Connections: Not on file    REVIEW OF SYSTEMS:  Denies  appetite changes, fevers, chills, fatigue, unexplained weight changes. Denies hearing loss, neck lumps or masses, mouth sores, ringing in ears or voice changes. Denies cough or wheezing.  Denies shortness of breath. Denies chest pain or palpitations. Denies leg swelling. Denies abdominal distention, pain, blood in stools, constipation, diarrhea, or early satiety. Denies pain with intercourse, dysuria, frequency, hematuria or incontinence. Denies hot flashes, pelvic pain, vaginal bleeding or vaginal discharge.   Denies joint pain, back pain or muscle pain/cramps. Denies itching, rash, or wounds. Denies dizziness, headaches, numbness or seizures. Denies swollen lymph nodes or glands, denies easy bruising or bleeding. Denies anxiety, depression, confusion, or decreased concentration.  Physical Exam:  Vital Signs for this encounter:  Blood pressure 105/75, pulse 91, temperature 98.4 F (36.9 C), temperature source Oral, resp. rate 14, height 5\' 2"  (1.575 m), weight 199 lb (90.3 kg), last menstrual period 07/05/2019, SpO2 100 %. Body mass index is 36.4 kg/m. General: Alert, oriented, no acute distress.  HEENT: Normocephalic, atraumatic. Sclera anicteric.  Chest: Clear to auscultation bilaterally. No wheezes, rhonchi, or rales. Cardiovascular: Regular rate and rhythm, no murmurs, rubs, or gallops.   LABORATORY AND RADIOLOGIC DATA:  Outside medical records were reviewed to synthesize the above history, along with the history and physical obtained during the visit.   Lab Results  Component Value Date   WBC 4.5 01/15/2021   HGB 10.3 (L) 01/15/2021   HCT 32.8 (L) 01/15/2021   PLT 560 (H) 01/15/2021   GLUCOSE 125 (H) 01/15/2021   ALT 13 01/15/2021   AST 18 01/15/2021   NA 142 01/15/2021   K 3.6 01/15/2021   CL 105 01/15/2021   CREATININE 0.68 01/15/2021   BUN 8 01/15/2021   CO2 27 01/15/2021   INR 0.9 11/25/2019   HGBA1C 6.3 (H) 09/19/2020   CT A/P on 6/10: Mildly prominent  proximal loops of small bowel although no true transition zone is seen in the distal small bowel appears within normal limits. These changes could represent early partial small bowel obstruction. The overall appearance however is similar to that seen on prior CT from May of 2022.   Mild peritoneal thickening similar to that seen on the prior exam.   Fatty liver.   No other focal abnormality is noted.

## 2021-01-23 NOTE — Anesthesia Procedure Notes (Signed)
Procedure Name: Intubation Date/Time: 01/23/2021 10:34 AM Performed by: Bonney Aid, CRNA Pre-anesthesia Checklist: Patient identified, Emergency Drugs available, Suction available and Patient being monitored Patient Re-evaluated:Patient Re-evaluated prior to induction Oxygen Delivery Method: Circle system utilized Preoxygenation: Pre-oxygenation with 100% oxygen Induction Type: IV induction Ventilation: Mask ventilation without difficulty Laryngoscope Size: Mac and 3 Grade View: Grade I Tube type: Oral Tube size: 7.0 mm Number of attempts: 1 Airway Equipment and Method: Stylet Placement Confirmation: ETT inserted through vocal cords under direct vision, positive ETCO2 and breath sounds checked- equal and bilateral Secured at: 21 cm Tube secured with: Tape Dental Injury: Teeth and Oropharynx as per pre-operative assessment

## 2021-01-23 NOTE — Progress Notes (Signed)
I talked to the patient in the recovery room when she was sufficiently awake from anesthesia.  Talked about findings during surgery, unavoidable enterotomy with repair.  Plan for admission, likely 2 or 3 nights as we advance her diet.  Also discussed no obvious finding of active cancer although some areas consistent with treatment effect.  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  Given morbidity associated with surgery to perform lysis of adhesions, recommendation is for lifestyle changes and dietary changes to see if we can manage her symptoms.  Patient understands that additional surgery would be quite morbid.  All questions answered.  Jeral Pinch MD Gynecologic Oncology

## 2021-01-23 NOTE — Transfer of Care (Signed)
Immediate Anesthesia Transfer of Care Note  Patient: Sarah Newman  Procedure(s) Performed: LAPAROSCOPY DIAGNOSTIC (Abdomen) LAPAROTOMY, ENTEROTOMY REPAIR OF THE BOWEL (Abdomen)  Patient Location: PACU  Anesthesia Type:General  Level of Consciousness: sedated  Airway & Oxygen Therapy: Patient Spontanous Breathing and Patient connected to nasal cannula oxygen  Post-op Assessment: Report given to RN  Post vital signs: Reviewed and stable  Last Vitals:  Vitals Value Taken Time  BP 131/85 01/23/21 1242  Temp    Pulse 85 01/23/21 1245  Resp 18 01/23/21 1245  SpO2 99 % 01/23/21 1245  Vitals shown include unvalidated device data.  Last Pain:  Vitals:   01/23/21 0852  TempSrc: Oral  PainSc: 0-No pain      Patients Stated Pain Goal: 7 (74/08/14 4818)  Complications: No notable events documented.

## 2021-01-23 NOTE — Interval H&P Note (Signed)
History and Physical Interval Note:  01/23/2021 10:22 AM  Sarah Newman  has presented today for surgery, with the diagnosis of PARTIAL BOWEL OBSTRUCTION, HISTORY OF OVARIAN CANCER.  The various methods of treatment have been discussed with the patient and family. After consideration of risks, benefits and other options for treatment, the patient has consented to  Procedure(s): LAPAROSCOPY DIAGNOSTIC (N/A) POSSIBLE XI ROBOTIC ASSISTED LAPAROSCOPIC LYSIS OF ADHESION (N/A) POSSIBLE LAPAROTOMY (N/A) POSSIBLE BOWEL RESECTION (N/A) as a surgical intervention.  The patient's history has been reviewed, patient examined, no change in status, stable for surgery.  I have reviewed the patient's chart and labs.  Questions were answered to the patient's satisfaction.     Lafonda Mosses

## 2021-01-23 NOTE — Anesthesia Preprocedure Evaluation (Signed)
Anesthesia Evaluation  Patient identified by MRN, date of birth, ID band Patient awake    Reviewed: Allergy & Precautions, NPO status , Patient's Chart, lab work & pertinent test results  Airway Mallampati: II  TM Distance: >3 FB Neck ROM: Full    Dental  (+) Teeth Intact   Pulmonary asthma ,    Pulmonary exam normal        Cardiovascular negative cardio ROS   Rhythm:Regular Rate:Normal     Neuro/Psych negative neurological ROS  negative psych ROS   GI/Hepatic negative GI ROS, Neg liver ROS, Partial obstruction   Endo/Other  diabetes, Well Controlled, Type 2, Oral Hypoglycemic Agents  Renal/GU negative Renal ROS  Female GU complaint Ovarian cancer     Musculoskeletal  (+) Arthritis ,   Abdominal (+)  Abdomen: soft. Bowel sounds: normal.  Peds  Hematology  (+) anemia ,   Anesthesia Other Findings   Reproductive/Obstetrics                             Anesthesia Physical Anesthesia Plan  ASA: 3  Anesthesia Plan: General   Post-op Pain Management:    Induction: Intravenous  PONV Risk Score and Plan: 3 and Ondansetron, Dexamethasone, Midazolam and Treatment may vary due to age or medical condition  Airway Management Planned: Mask and Oral ETT  Additional Equipment: None  Intra-op Plan:   Post-operative Plan: Extubation in OR  Informed Consent: I have reviewed the patients History and Physical, chart, labs and discussed the procedure including the risks, benefits and alternatives for the proposed anesthesia with the patient or authorized representative who has indicated his/her understanding and acceptance.     Dental advisory given  Plan Discussed with: CRNA  Anesthesia Plan Comments: (Lab Results      Component                Value               Date                      WBC                      4.5                 01/15/2021                HGB                       10.3 (L)            01/15/2021                HCT                      32.8 (L)            01/15/2021                MCV                      87.7                01/15/2021                PLT  560 (H)             01/15/2021           Lab Results      Component                Value               Date                      NA                       142                 01/15/2021                K                        3.6                 01/15/2021                CO2                      27                  01/15/2021                GLUCOSE                  125 (H)             01/15/2021                BUN                      8                   01/15/2021                CREATININE               0.68                01/15/2021                CALCIUM                  9.5                 01/15/2021                GFRNONAA                 >60                 01/15/2021                GFRAA                    >60                 04/05/2020          )        Anesthesia Quick Evaluation

## 2021-01-23 NOTE — Consult Note (Signed)
Dr. Kae Heller came in the [place] room for consult for Dr. Berline Lopes at [phone]

## 2021-01-24 ENCOUNTER — Encounter: Payer: Medicaid Other | Admitting: Gynecologic Oncology

## 2021-01-24 LAB — BASIC METABOLIC PANEL
Anion gap: 6 (ref 5–15)
BUN: 12 mg/dL (ref 6–20)
CO2: 27 mmol/L (ref 22–32)
Calcium: 9.2 mg/dL (ref 8.9–10.3)
Chloride: 104 mmol/L (ref 98–111)
Creatinine, Ser: 0.63 mg/dL (ref 0.44–1.00)
GFR, Estimated: >60 mL/min (ref >60)
Glucose, Bld: 168 mg/dL — ABNORMAL HIGH (ref 70–99)
Potassium: 4.5 mmol/L (ref 3.5–5.1)
Sodium: 137 mmol/L (ref 135–145)

## 2021-01-24 LAB — CBC
HCT: 34.9 % — ABNORMAL LOW (ref 36.0–46.0)
Hemoglobin: 10.4 g/dL — ABNORMAL LOW (ref 12.0–15.0)
MCH: 27.8 pg (ref 26.0–34.0)
MCHC: 29.8 g/dL — ABNORMAL LOW (ref 30.0–36.0)
MCV: 93.3 fL (ref 80.0–100.0)
Platelets: 247 10*3/uL (ref 150–400)
RBC: 3.74 MIL/uL — ABNORMAL LOW (ref 3.87–5.11)
RDW: 14.9 % (ref 11.5–15.5)
WBC: 13 10*3/uL — ABNORMAL HIGH (ref 4.0–10.5)
nRBC: 0 % (ref 0.0–0.2)

## 2021-01-24 LAB — GLUCOSE, CAPILLARY
Glucose-Capillary: 139 mg/dL — ABNORMAL HIGH (ref 70–99)
Glucose-Capillary: 128 mg/dL — ABNORMAL HIGH (ref 70–99)
Glucose-Capillary: 159 mg/dL — ABNORMAL HIGH (ref 70–99)
Glucose-Capillary: 113 mg/dL — ABNORMAL HIGH (ref 70–99)

## 2021-01-24 NOTE — Progress Notes (Addendum)
1 Day Post-Op Procedure(s) (LRB): LAPAROSCOPY DIAGNOSTIC (N/A) LAPAROTOMY, ENTEROTOMY REPAIR OF THE BOWEL (N/A)  Subjective: Patient reports incisional soreness controlled with PRN medications. Tolerating diet with no nausea or emesis. Passing flatus. No BM reported. Up with assist. Voiding without difficulty. No chest pain or dyspnea. Requesting an abdominal binder. No concerns voiced.   Objective: Vital signs in last 24 hours: Temp:  [97.5 F (36.4 C)-98.1 F (36.7 C)] 97.5 F (36.4 C) (06/23 0526) Pulse Rate:  [57-93] 64 (06/23 0526) Resp:  [9-20] 18 (06/23 0526) BP: (105-142)/(71-94) 105/72 (06/23 0526) SpO2:  [93 %-100 %] 96 % (06/23 0526) Last BM Date: 01/23/21  Intake/Output from previous day: 06/22 0701 - 06/23 0700 In: 4021.3 [P.O.:1080; I.V.:2821.3; IV Piggyback:120] Out: 1275 [Urine:1175; Blood:100]  Physical Examination: General: alert, cooperative, and no distress Resp: clear to auscultation bilaterally Cardio: regular rate and rhythm, S1, S2 normal, no murmur, click, rub or gallop GI: incision: lap site incisions intact with dermabond, midline incision with op site dressing intact with no drainage noted underneath dsg and abdomen obese, soft, active bowel sounds, appropriate tenderness Extremities: extremities normal, atraumatic, no cyanosis or edema  Labs: WBC/Hgb/Hct/Plts:  13.0/10.4/34.9/247 (06/23 0500) BUN/Cr/glu/ALT/AST/amyl/lip:  12/0.63/--/--/--/--/-- (06/23 0500)  Assessment: 45 y.o. s/p Procedure(s): LAPAROSCOPY DIAGNOSTIC LAPAROTOMY, ENTEROTOMY REPAIR OF THE BOWEL: stable Pain:  Pain is well-controlled on PCA or oral medications.  Heme: Hgb 10.4 and Hct 34.9- appropriate compared with pre-op values and surgical losses.  ID: WBC 13.0 this am-given decadron intraoperatively. Ancef and Invanz given intraoperatively after unavoidable enterotomy.  CV: BP and HR normal. Continue to monitor with ordered vital signs.  GI:  Tolerating po: Yes. S/P  unavoidable enterotomy due to significant small bowel adhesions to the anterior abdominal wall along her prior incision. Antiemetics ordered as needed. Scopolamine patch in place.  GU: Voiding without difficulty. Creatinine 0.63 this am.   FEN: No critical values noted.   Endo: Diabetes mellitus Type II, under good control.  CBG: CBG (last 3)  Recent Labs    01/23/21 1722 01/23/21 2155 01/24/21 0742  GLUCAP 153* 210* 139*     Prophylaxis: SCDs on and lovenox ordered.  Plan: Abdominal binder ordered for support Diet as tolerated Saline lock IVF Encourage IS use, deep breathing, and coughing Continue plan of care per Dr. Berline Lopes, Dr. Denman George  The patient is to be discharged to home in 1-2 days.   LOS: 1 day    Sarah Newman 01/24/2021, 9:23 AM

## 2021-01-24 NOTE — Anesthesia Postprocedure Evaluation (Signed)
Anesthesia Post Note  Patient: Cortnee Steinmiller  Procedure(s) Performed: LAPAROSCOPY DIAGNOSTIC (Abdomen) LAPAROTOMY, ENTEROTOMY REPAIR OF THE BOWEL (Abdomen)     Patient location during evaluation: PACU Anesthesia Type: General Level of consciousness: awake and alert Pain management: pain level controlled Vital Signs Assessment: post-procedure vital signs reviewed and stable Respiratory status: spontaneous breathing, nonlabored ventilation, respiratory function stable and patient connected to nasal cannula oxygen Cardiovascular status: blood pressure returned to baseline and stable Postop Assessment: no apparent nausea or vomiting Anesthetic complications: no   No notable events documented.  Last Vitals:  Vitals:   01/24/21 1351 01/24/21 2136  BP: 102/81 109/70  Pulse: (!) 55 60  Resp: 18 19  Temp: (!) 36.3 C 36.6 C  SpO2: 100% 100%    Last Pain:  Vitals:   01/24/21 2136  TempSrc: Oral  PainSc:                  March Rummage Chael Urenda

## 2021-01-24 NOTE — Progress Notes (Signed)
Chaplain engaged in an initial visit with Mikylah, learning about her healthcare journey.  Chaplain was able to provide listening, presence, understanding and support as she shared about the difficulties of being diagnosed and undergoing chemo more than once.  Noorah talked about the ways chemo has impacted her body which is why she is in the hospital currently. Maryln detailed the journey it took to find out she had ovarian cancer and the ways she has had to learn to advocate for herself and to ask the right questions.    Chaplain and Derika also talked about family, life, finances, work and healthcare.  Dede has a great amount of self-awareness for what she needs and desires from life at this time.  She has worked to institute healthy boundaries and live into resting and recovering, while also acknowledging that she is ready to get back to work.  Pamula noted the challenges that have come with protecting herself, her family and her peace, as well as the challenges of being out of work since February.  Frankee can claim at this time in her life what she can and cannot do as she works to be healthy in all areas of her life.    Kasondra does have a great support system in her husband, children, and parents.  She has done the work of recognizing who has the capacity to support her in the ways she and her household need.    Chaplain and Elfriede also discussed therapy, support groups and counseling options.  Chaplain is going to provide some resources as Amaryllis desires to be holistically well.  Chaplain offered prayer with Lexia and will follow-up with list of resources.    01/24/21 1500  Clinical Encounter Type  Visited With Patient  Visit Type Initial

## 2021-01-25 ENCOUNTER — Ambulatory Visit: Payer: Medicaid Other

## 2021-01-25 ENCOUNTER — Other Ambulatory Visit: Payer: Medicaid Other

## 2021-01-25 ENCOUNTER — Encounter (HOSPITAL_BASED_OUTPATIENT_CLINIC_OR_DEPARTMENT_OTHER): Payer: Self-pay | Admitting: Gynecologic Oncology

## 2021-01-25 LAB — CBC
HCT: 30.4 % — ABNORMAL LOW (ref 36.0–46.0)
Hemoglobin: 9.4 g/dL — ABNORMAL LOW (ref 12.0–15.0)
MCH: 28.4 pg (ref 26.0–34.0)
MCHC: 30.9 g/dL (ref 30.0–36.0)
MCV: 91.8 fL (ref 80.0–100.0)
Platelets: 209 10*3/uL (ref 150–400)
RBC: 3.31 MIL/uL — ABNORMAL LOW (ref 3.87–5.11)
RDW: 14.9 % (ref 11.5–15.5)
WBC: 8 10*3/uL (ref 4.0–10.5)
nRBC: 0 % (ref 0.0–0.2)

## 2021-01-25 LAB — BASIC METABOLIC PANEL
Anion gap: 6 (ref 5–15)
BUN: 12 mg/dL (ref 6–20)
CO2: 28 mmol/L (ref 22–32)
Calcium: 8.8 mg/dL — ABNORMAL LOW (ref 8.9–10.3)
Chloride: 106 mmol/L (ref 98–111)
Creatinine, Ser: 0.65 mg/dL (ref 0.44–1.00)
GFR, Estimated: >60 mL/min (ref >60)
Glucose, Bld: 109 mg/dL — ABNORMAL HIGH (ref 70–99)
Potassium: 3.8 mmol/L (ref 3.5–5.1)
Sodium: 140 mmol/L (ref 135–145)

## 2021-01-25 LAB — GLUCOSE, CAPILLARY: Glucose-Capillary: 93 mg/dL (ref 70–99)

## 2021-01-25 MED ORDER — CHLORHEXIDINE GLUCONATE CLOTH 2 % EX PADS
6.0000 | MEDICATED_PAD | Freq: Every day | CUTANEOUS | Status: DC
  Administered 2021-01-25: 6 via TOPICAL

## 2021-01-25 NOTE — Discharge Instructions (Signed)
AFTER SURGERY INSTRUCTIONS   Return to work: 6 weeks if applicable  You will have a white honeycomb dressing over your larger incision. This dressing can be removed 5 days after surgery and you do not need to reapply a new dressing. Once you remove the dressing, you will notice that you have the surgical glue (dermabond) on the incision and this will peel off on its own. You can get this dressing wet in the shower the days after surgery prior to removal on the 5th day.   Dr. Berline Lopes recommends the use of a stool softener once or twice a day starting now. We would like to avoid the use of stimulate medications at this time. If you feel that you are constipation or impacted at the level of the rectum, you are safe to use an enema or suppository. After one week, you would be safe to begin taking sennakot with the stool softeners to prevent constipation. She recommends sticking to the diet you were doing after your last obstructive episode (no salads, small meals, things that are easier to digest, liquids or soft things).  Please call our office for any new symptoms such as fever, increased abdominal pain, nausea/emesis, or for any changes.   Activity: 1. Be up and out of the bed during the day.  Take a nap if needed.  You may walk up steps but be careful and use the hand rail.  Stair climbing will tire you more than you think, you may need to stop part way and rest.   2. No lifting or straining for 6 weeks over 10 pounds. No pushing, pulling, straining for 6 weeks.   3. No driving for around 1-2 week(s).  Do not drive if you are taking narcotic pain medicine and make sure that your reaction time has returned.   4. You can shower as soon as the next day after surgery. Shower daily.  Use your regular soap and water (not directly on the incision) and pat your incision(s) dry afterwards; don't rub.  No tub baths or submerging your body in water until cleared by your surgeon. If you have the soap that was  given to you by pre-surgical testing that was used before surgery, you do not need to use it afterwards because this can irritate your incisions.   5. No sexual activity and nothing in the vagina for 2 weeks.   6. You may experience a small amount of clear drainage from your incision(s), which is normal.  If the drainage persists, increases, or changes color please call the office.   7. Do not use creams, lotions, or ointments such as neosporin on your incisions after surgery until advised by your surgeon because they can cause removal of the dermabond glue on your incisions.     8. Take Tylenol first for pain and only use narcotic pain medication for severe pain not relieved by the Tylenol.  Monitor your Tylenol intake to a max of 4,000 mg in a 24 hour period.    Diet: Continue the diet modifications you were implementing after your last obstruction episode. Dr. Berline Lopes recommends taking in liquids/softer food items that are easier to digest and small meals instead of large meals.   Wound Care: 1. Keep clean and dry.  Shower daily.   Reasons to call the Doctor: Fever - Oral temperature greater than 100.4 degrees Fahrenheit Foul-smelling vaginal discharge Difficulty urinating Nausea and vomiting Increased pain at the site of the incision that is unrelieved with  pain medicine. Difficulty breathing with or without chest pain New calf pain especially if only on one side Sudden, continuing increased vaginal bleeding with or without clots.   Contacts: For questions or concerns you should contact:   Dr. Jeral Pinch at [phone]   Joylene John, NP at [phone]   After Hours: call [phone] and have the GYN Oncologist paged/contacted (after 5 pm or on the weekends).   Messages sent via mychart are for non-urgent matters and are not responded to after hours so for urgent needs, please call the after hours number.

## 2021-01-25 NOTE — Discharge Summary (Signed)
Physician Discharge Summary  Patient ID: Sarah Newman MRN: [id number] DOB/AGE: [birth year] 45 y.o.  Admit date: 01/23/2021 Discharge date: 01/25/2021  Admission Diagnoses: Intermittent small bowel obstruction due to adhesions [facility])  Discharge Diagnoses:  Principal Problem:   Intermittent small bowel obstruction due to adhesions [facility]) Active Problems:   Intra-abdominal adhesions   Discharged Condition:  The patient is in good condition and stable for discharge.    Hospital Course: On 01/23/2021, the patient underwent the following: Procedure(s): LAPAROSCOPY DIAGNOSTIC, LAPAROTOMY, ENTEROTOMY REPAIR OF THE BOWEL. The postoperative course was uneventful.  She was discharged to home on postoperative day 2 tolerating a regular diet, pain controlled with oral medications, passing flatus, ambulating without difficulty.   Consults: None  Significant Diagnostic Studies: Labs  Treatments: Surgery: see above  Discharge Exam (AM assessment): Blood pressure (!) 145/87, pulse (!) 54, temperature 97.8 F (36.6 C), temperature source Oral, resp. rate 16, height 5\' 2"  (1.575 m), weight 199 lb (90.3 kg), last menstrual period 07/05/2019, SpO2 100 %. General appearance: alert, cooperative, and no distress Resp: clear to auscultation bilaterally Cardio: regular rate and rhythm, S1, S2 normal, no murmur, click, rub or gallop GI: abdomen soft, active bowel sounds, non-distended Extremities: extremities normal, atraumatic, no cyanosis or edema Incision/Wound:Lap sites to the abdomen x3 with dermabond without erythema or drainage. Midline laparotomy incision intact with op site dressing in place. No drainage noted underneath  Disposition: Discharge disposition: 01-Home or Self Care      Discharge Instructions     Call MD for:  difficulty breathing, headache or visual disturbances   Complete by: As directed    Call MD for:  extreme fatigue   Complete by: As directed    Call MD for:  hives    Complete by: As directed    Call MD for:  persistant dizziness or light-headedness   Complete by: As directed    Call MD for:  persistant nausea and vomiting   Complete by: As directed    Call MD for:  redness, tenderness, or signs of infection (pain, swelling, redness, odor or green/yellow discharge around incision site)   Complete by: As directed    Call MD for:  severe uncontrolled pain   Complete by: As directed    Call MD for:  temperature >100.4   Complete by: As directed    Diet - low sodium heart healthy   Complete by: As directed    Discharge wound care:   Complete by: As directed    You will have a white honeycomb dressing over your larger incision. This dressing can be removed 5 days after surgery and you do not need to reapply a new dressing. Once you remove the dressing, you will notice that you have the surgical glue (dermabond) on the incision and this will peel off on its own. You can get this dressing wet in the shower the days after surgery prior to removal on the 5th day.   Driving Restrictions   Complete by: As directed    No driving for 1-2 weeks.  Do not take narcotics and drive and you need to make sure your reaction time has returned.   Increase activity slowly   Complete by: As directed    Lifting restrictions   Complete by: As directed    No lifting greater than 10 lbs, pushing, pulling, straining for 6 weeks.   Sexual Activity Restrictions   Complete by: As directed    No sexual activity, nothing in the vagina, for  2 weeks.      Allergies as of 01/25/2021       Reactions   Carboplatin Shortness Of Breath        Medication List     TAKE these medications    acetaminophen 500 MG tablet Commonly known as: TYLENOL Take 1,000 mg by mouth every 6 (six) hours as needed for moderate pain.   multivitamin with minerals Tabs tablet Take 1 tablet by mouth daily.   omeprazole 20 MG capsule Commonly known as: PRILOSEC Take 20 mg by mouth daily as  needed (heartburn).   ondansetron 4 MG disintegrating tablet Commonly known as: Zofran ODT Take 1 tablet (4 mg total) by mouth every 4 (four) hours as needed for nausea or vomiting.   oxyCODONE 5 MG immediate release tablet Commonly known as: Oxy IR/ROXICODONE Take 1-2 tablets (5-10 mg total) by mouth every 4 (four) hours as needed for severe pain. For AFTER surgery, do not take and drive       ASK your doctor about these medications    lidocaine-prilocaine cream Commonly known as: EMLA APPLY TO THE AFFECTED AREA AS DIRECTED TO ACCESS PORT   metFORMIN 500 MG tablet Commonly known as: GLUCOPHAGE TAKE 1 TABLET BY MOUTH TWICE DAILY WITH A MEAL   ondansetron 8 MG tablet Commonly known as: ZOFRAN TAKE 1 TABLET BY MOUTH EVERY 8 HOURS AS NEEDED FOR NAUSEA OR VOMITING               Discharge Care Instructions  (From admission, onward)           Start     Ordered   01/25/21 0000  Discharge wound care:       Comments: You will have a white honeycomb dressing over your larger incision. This dressing can be removed 5 days after surgery and you do not need to reapply a new dressing. Once you remove the dressing, you will notice that you have the surgical glue (dermabond) on the incision and this will peel off on its own. You can get this dressing wet in the shower the days after surgery prior to removal on the 5th day.   01/25/21 1057            Follow-up Information     Lafonda Mosses, MD Follow up on 02/15/2021.   Specialty: Gynecologic Oncology Why: at 1:30pm at the [facility] information: [place] [id number] [phone]                 Greater than thirty minutes were spend for face to face discharge instructions and discharge orders/summary in EPIC.   Patient reviewed with Dr. Berline Lopes and Dr. Denman George, who are clearing the patient for discharge today.  Signed: [name] 01/25/2021, 11:43 AM

## 2021-01-25 NOTE — Plan of Care (Signed)

## 2021-01-25 NOTE — Progress Notes (Signed)
Chaplain engaged in a follow-up visit.  Chaplain provided Sarah Newman with support group/therapy information for cancer.  Chaplain continues to offer support and will follow-up as needed.    01/25/21 0900  Clinical Encounter Type  Visited With Patient  Visit Type Follow-up

## 2021-01-28 ENCOUNTER — Telehealth: Payer: Self-pay

## 2021-01-28 NOTE — Telephone Encounter (Signed)
Spoke with Sarah Newman this afternoon. She states she is eating, drinking and urinating well. She had a BM yesterday and felt as though she passed a blockage. States she feels better after that. She is taking miralax daily and will begin taking senokot the second week of recovery. Following a soft diet, and small meals that are easy to digest. She denies fever or chills. Honeycomb dressing still in place, she will be removing today. Reinforced wound care. Her pain is controlled with tylenol.   Instructed to call office with any fever, chills, purulent drainage, uncontrolled pain or any other questions or concerns. Patient verbalizes understanding.   Pt aware of post op appointments as well as the office number [phone] and after hours number [phone] to call if she has any questions or concerns

## 2021-01-30 ENCOUNTER — Encounter: Payer: Self-pay | Admitting: General Practice

## 2021-01-30 NOTE — Progress Notes (Signed)
[facility] Note  Attempted to follow up by phone, but voicemail is full. Plan to see Aniayah in person at next infusion on 7/8.   [place], [place], [facility] Pager [phone] Voicemail [phone]

## 2021-02-01 ENCOUNTER — Encounter: Payer: Self-pay | Admitting: Hematology and Oncology

## 2021-02-02 ENCOUNTER — Emergency Department (HOSPITAL_COMMUNITY): Payer: Medicaid Other | Admitting: Anesthesiology

## 2021-02-02 ENCOUNTER — Encounter (HOSPITAL_COMMUNITY)
Admission: EM | Disposition: A | Discharge status: Home Health | Payer: Self-pay | Source: Home / Self Care | Attending: Gynecologic Oncology

## 2021-02-02 ENCOUNTER — Other Ambulatory Visit: Payer: Self-pay

## 2021-02-02 ENCOUNTER — Emergency Department (HOSPITAL_COMMUNITY): Payer: Medicaid Other

## 2021-02-02 ENCOUNTER — Inpatient Hospital Stay (HOSPITAL_COMMUNITY)
Admission: EM | Admit: 2021-02-02 | Discharge: 2021-02-10 | DRG: 863 | Disposition: A | Discharge status: Home Health | Payer: Medicaid Other | Attending: Gynecologic Oncology | Admitting: Gynecologic Oncology

## 2021-02-02 ENCOUNTER — Encounter (HOSPITAL_COMMUNITY): Payer: Self-pay | Admitting: Obstetrics & Gynecology

## 2021-02-02 DIAGNOSIS — T8141XA Infection following a procedure, superficial incisional surgical site, initial encounter: Principal | ICD-10-CM

## 2021-02-02 DIAGNOSIS — L0291 Cutaneous abscess, unspecified: Secondary | ICD-10-CM

## 2021-02-02 DIAGNOSIS — Z90710 Acquired absence of both cervix and uterus: Secondary | ICD-10-CM

## 2021-02-02 DIAGNOSIS — K632 Fistula of intestine: Secondary | ICD-10-CM

## 2021-02-02 DIAGNOSIS — Z800 Family history of malignant neoplasm of digestive organs: Secondary | ICD-10-CM

## 2021-02-02 DIAGNOSIS — J45909 Unspecified asthma, uncomplicated: Secondary | ICD-10-CM | POA: Diagnosis present

## 2021-02-02 DIAGNOSIS — R6510 Systemic inflammatory response syndrome (SIRS) of non-infectious origin without acute organ dysfunction: Secondary | ICD-10-CM | POA: Diagnosis present

## 2021-02-02 DIAGNOSIS — Z8543 Personal history of malignant neoplasm of ovary: Secondary | ICD-10-CM

## 2021-02-02 DIAGNOSIS — Z8041 Family history of malignant neoplasm of ovary: Secondary | ICD-10-CM

## 2021-02-02 DIAGNOSIS — Z803 Family history of malignant neoplasm of breast: Secondary | ICD-10-CM

## 2021-02-02 DIAGNOSIS — E099 Drug or chemical induced diabetes mellitus without complications: Secondary | ICD-10-CM | POA: Diagnosis present

## 2021-02-02 DIAGNOSIS — G4700 Insomnia, unspecified: Secondary | ICD-10-CM | POA: Diagnosis not present

## 2021-02-02 DIAGNOSIS — Z888 Allergy status to other drugs, medicaments and biological substances status: Secondary | ICD-10-CM

## 2021-02-02 DIAGNOSIS — Z20822 Contact with and (suspected) exposure to covid-19: Secondary | ICD-10-CM | POA: Diagnosis present

## 2021-02-02 DIAGNOSIS — Z90722 Acquired absence of ovaries, bilateral: Secondary | ICD-10-CM

## 2021-02-02 DIAGNOSIS — Z9079 Acquired absence of other genital organ(s): Secondary | ICD-10-CM

## 2021-02-02 DIAGNOSIS — R066 Hiccough: Secondary | ICD-10-CM | POA: Diagnosis not present

## 2021-02-02 DIAGNOSIS — R519 Headache, unspecified: Secondary | ICD-10-CM | POA: Diagnosis not present

## 2021-02-02 DIAGNOSIS — Z8616 Personal history of COVID-19: Secondary | ICD-10-CM

## 2021-02-02 DIAGNOSIS — L02211 Cutaneous abscess of abdominal wall: Secondary | ICD-10-CM | POA: Diagnosis present

## 2021-02-02 DIAGNOSIS — Z6835 Body mass index (BMI) 35.0-35.9, adult: Secondary | ICD-10-CM

## 2021-02-02 DIAGNOSIS — D6481 Anemia due to antineoplastic chemotherapy: Secondary | ICD-10-CM | POA: Diagnosis present

## 2021-02-02 DIAGNOSIS — G8929 Other chronic pain: Secondary | ICD-10-CM | POA: Diagnosis present

## 2021-02-02 DIAGNOSIS — Y848 Other medical procedures as the cause of abnormal reaction of the patient, or of later complication, without mention of misadventure at the time of the procedure: Secondary | ICD-10-CM | POA: Diagnosis not present

## 2021-02-02 DIAGNOSIS — Z7984 Long term (current) use of oral hypoglycemic drugs: Secondary | ICD-10-CM

## 2021-02-02 DIAGNOSIS — F418 Other specified anxiety disorders: Secondary | ICD-10-CM

## 2021-02-02 DIAGNOSIS — Z79899 Other long term (current) drug therapy: Secondary | ICD-10-CM

## 2021-02-02 DIAGNOSIS — R000 Tachycardia, unspecified: Secondary | ICD-10-CM | POA: Diagnosis present

## 2021-02-02 DIAGNOSIS — Z789 Other specified health status: Secondary | ICD-10-CM

## 2021-02-02 DIAGNOSIS — E669 Obesity, unspecified: Secondary | ICD-10-CM | POA: Diagnosis present

## 2021-02-02 DIAGNOSIS — G4709 Other insomnia: Secondary | ICD-10-CM

## 2021-02-02 DIAGNOSIS — Z8042 Family history of malignant neoplasm of prostate: Secondary | ICD-10-CM

## 2021-02-02 DIAGNOSIS — K5651 Intestinal adhesions [bands], with partial obstruction: Secondary | ICD-10-CM | POA: Diagnosis present

## 2021-02-02 DIAGNOSIS — C562 Malignant neoplasm of left ovary: Secondary | ICD-10-CM | POA: Diagnosis present

## 2021-02-02 DIAGNOSIS — T451X5A Adverse effect of antineoplastic and immunosuppressive drugs, initial encounter: Secondary | ICD-10-CM | POA: Diagnosis present

## 2021-02-02 DIAGNOSIS — T82848A Pain from vascular prosthetic devices, implants and grafts, initial encounter: Secondary | ICD-10-CM | POA: Diagnosis not present

## 2021-02-02 DIAGNOSIS — R112 Nausea with vomiting, unspecified: Secondary | ICD-10-CM | POA: Diagnosis not present

## 2021-02-02 DIAGNOSIS — T8149XA Infection following a procedure, other surgical site, initial encounter: Secondary | ICD-10-CM | POA: Diagnosis present

## 2021-02-02 HISTORY — DX: Other specified health status: Z78.9

## 2021-02-02 HISTORY — PX: INCISION AND DRAINAGE ABSCESS: SHX5864

## 2021-02-02 LAB — COMPREHENSIVE METABOLIC PANEL
ALT: 17 U/L (ref 0–44)
AST: 17 U/L (ref 15–41)
Albumin: 4.2 g/dL (ref 3.5–5.0)
Alkaline Phosphatase: 107 U/L (ref 38–126)
Anion gap: 12 (ref 5–15)
BUN: 9 mg/dL (ref 6–20)
CO2: 23 mmol/L (ref 22–32)
Calcium: 9.6 mg/dL (ref 8.9–10.3)
Chloride: 102 mmol/L (ref 98–111)
Creatinine, Ser: 0.62 mg/dL (ref 0.44–1.00)
GFR, Estimated: >60 mL/min (ref >60)
Glucose, Bld: 130 mg/dL — ABNORMAL HIGH (ref 70–99)
Potassium: 3.6 mmol/L (ref 3.5–5.1)
Sodium: 137 mmol/L (ref 135–145)
Total Bilirubin: 0.8 mg/dL (ref 0.3–1.2)
Total Protein: 8.6 g/dL — ABNORMAL HIGH (ref 6.5–8.1)

## 2021-02-02 LAB — RESP PANEL BY RT-PCR (FLU A&B, COVID) ARPGX2
Influenza A by PCR: NEGATIVE
Influenza B by PCR: NEGATIVE
SARS Coronavirus 2 by RT PCR: NEGATIVE

## 2021-02-02 LAB — PROTIME-INR
INR: 1 (ref 0.8–1.2)
Prothrombin Time: 13.5 seconds (ref 11.4–15.2)

## 2021-02-02 LAB — CBC WITH DIFFERENTIAL/PLATELET
Abs Immature Granulocytes: 0.12 10*3/uL — ABNORMAL HIGH (ref 0.00–0.07)
Basophils Absolute: 0 10*3/uL (ref 0.0–0.1)
Basophils Relative: 0 %
Eosinophils Absolute: 0 10*3/uL (ref 0.0–0.5)
Eosinophils Relative: 0 %
HCT: 37.6 % (ref 36.0–46.0)
Hemoglobin: 12.1 g/dL (ref 12.0–15.0)
Immature Granulocytes: 1 %
Lymphocytes Relative: 5 %
Lymphs Abs: 1.1 10*3/uL (ref 0.7–4.0)
MCH: 28.1 pg (ref 26.0–34.0)
MCHC: 32.2 g/dL (ref 30.0–36.0)
MCV: 87.4 fL (ref 80.0–100.0)
Monocytes Absolute: 1 10*3/uL (ref 0.1–1.0)
Monocytes Relative: 4 %
Neutro Abs: 21.7 10*3/uL — ABNORMAL HIGH (ref 1.7–7.7)
Neutrophils Relative %: 90 %
Platelets: 257 10*3/uL (ref 150–400)
RBC: 4.3 MIL/uL (ref 3.87–5.11)
RDW: 14.6 % (ref 11.5–15.5)
WBC: 24 10*3/uL — ABNORMAL HIGH (ref 4.0–10.5)
nRBC: 0 % (ref 0.0–0.2)

## 2021-02-02 LAB — I-STAT BETA HCG BLOOD, ED (MC, WL, AP ONLY): I-stat hCG, quantitative: <5 m[IU]/mL (ref <5)

## 2021-02-02 LAB — LIPASE, BLOOD: Lipase: 26 U/L (ref 11–51)

## 2021-02-02 LAB — LACTIC ACID, PLASMA: Lactic Acid, Venous: 1.4 mmol/L (ref 0.5–1.9)

## 2021-02-02 LAB — APTT: aPTT: 31 seconds (ref 24–36)

## 2021-02-02 SURGERY — INCISION AND DRAINAGE, ABSCESS
Anesthesia: General | Site: Abdomen

## 2021-02-02 MED ORDER — SODIUM CHLORIDE 0.9 % IV SOLN
2.0000 g | Freq: Once | INTRAVENOUS | Status: AC
  Administered 2021-02-02: 2 g via INTRAVENOUS
  Filled 2021-02-02: qty 2

## 2021-02-02 MED ORDER — SCOPOLAMINE 1 MG/3DAYS TD PT72
MEDICATED_PATCH | TRANSDERMAL | Status: AC
  Filled 2021-02-02: qty 1

## 2021-02-02 MED ORDER — HYDROMORPHONE HCL 1 MG/ML IJ SOLN
1.0000 mg | Freq: Once | INTRAMUSCULAR | Status: AC
  Administered 2021-02-02: 1 mg via INTRAVENOUS
  Filled 2021-02-02: qty 1

## 2021-02-02 MED ORDER — SUGAMMADEX SODIUM 200 MG/2ML IV SOLN
INTRAVENOUS | Status: DC | PRN
  Administered 2021-02-02: 400 mg via INTRAVENOUS

## 2021-02-02 MED ORDER — OXYCODONE HCL 5 MG PO TABS
5.0000 mg | ORAL_TABLET | ORAL | Status: DC | PRN
  Administered 2021-02-02 – 2021-02-03 (×2): 5 mg via ORAL
  Filled 2021-02-02 (×2): qty 1

## 2021-02-02 MED ORDER — ONDANSETRON HCL 4 MG/2ML IJ SOLN
INTRAMUSCULAR | Status: DC | PRN
  Administered 2021-02-02: 4 mg via INTRAVENOUS

## 2021-02-02 MED ORDER — METRONIDAZOLE 500 MG/100ML IV SOLN
500.0000 mg | Freq: Once | INTRAVENOUS | Status: AC
  Administered 2021-02-02: 500 mg via INTRAVENOUS
  Filled 2021-02-02: qty 100

## 2021-02-02 MED ORDER — PIPERACILLIN-TAZOBACTAM 3.375 G IVPB
3.3750 g | Freq: Three times a day (TID) | INTRAVENOUS | Status: DC
  Administered 2021-02-02 – 2021-02-06 (×11): 3.375 g via INTRAVENOUS
  Filled 2021-02-02 (×12): qty 50

## 2021-02-02 MED ORDER — ACETAMINOPHEN 325 MG PO TABS: 325.0000 mg | ORAL_TABLET | ORAL | Status: DC | PRN

## 2021-02-02 MED ORDER — BUPIVACAINE-EPINEPHRINE (PF) 0.25% -1:200000 IJ SOLN
INTRAMUSCULAR | Status: AC
  Filled 2021-02-02: qty 30

## 2021-02-02 MED ORDER — PROMETHAZINE HCL 25 MG/ML IJ SOLN: 6.2500 mg | INTRAMUSCULAR | Status: DC | PRN

## 2021-02-02 MED ORDER — DIPHENHYDRAMINE HCL 50 MG/ML IJ SOLN
12.5000 mg | Freq: Once | INTRAMUSCULAR | Status: AC
  Administered 2021-02-02: 12.5 mg via INTRAVENOUS
  Filled 2021-02-02: qty 1

## 2021-02-02 MED ORDER — LACTATED RINGERS IV SOLN: INTRAVENOUS | Status: DC

## 2021-02-02 MED ORDER — HYDROMORPHONE HCL 1 MG/ML IJ SOLN
0.5000 mg | Freq: Once | INTRAMUSCULAR | Status: AC
  Administered 2021-02-02: 0.5 mg via INTRAVENOUS
  Filled 2021-02-02: qty 1

## 2021-02-02 MED ORDER — ACETAMINOPHEN 160 MG/5ML PO SOLN: 325.0000 mg | ORAL | Status: DC | PRN

## 2021-02-02 MED ORDER — AMISULPRIDE (ANTIEMETIC) 5 MG/2ML IV SOLN: 10.0000 mg | Freq: Once | INTRAVENOUS | Status: DC | PRN

## 2021-02-02 MED ORDER — BUPIVACAINE-EPINEPHRINE 0.25% -1:200000 IJ SOLN
INTRAMUSCULAR | Status: DC | PRN
  Administered 2021-02-02: 20 mL

## 2021-02-02 MED ORDER — PROPOFOL 10 MG/ML IV BOLUS
INTRAVENOUS | Status: DC | PRN
  Administered 2021-02-02: 140 mg via INTRAVENOUS

## 2021-02-02 MED ORDER — SCOPOLAMINE 1 MG/3DAYS TD PT72
MEDICATED_PATCH | TRANSDERMAL | Status: DC | PRN
  Administered 2021-02-02: 1 via TRANSDERMAL

## 2021-02-02 MED ORDER — MIDAZOLAM HCL 2 MG/2ML IJ SOLN
INTRAMUSCULAR | Status: DC | PRN
  Administered 2021-02-02 (×2): 1 mg via INTRAVENOUS

## 2021-02-02 MED ORDER — ONDANSETRON HCL 4 MG PO TABS: 4.0000 mg | ORAL_TABLET | Freq: Four times a day (QID) | ORAL | Status: DC | PRN

## 2021-02-02 MED ORDER — ROCURONIUM BROMIDE 10 MG/ML (PF) SYRINGE
PREFILLED_SYRINGE | INTRAVENOUS | Status: DC | PRN
  Administered 2021-02-02: 50 mg via INTRAVENOUS

## 2021-02-02 MED ORDER — OXYCODONE HCL 5 MG PO TABS
5.0000 mg | ORAL_TABLET | Freq: Once | ORAL | Status: DC | PRN
Start: 2021-02-02 — End: 2021-02-02

## 2021-02-02 MED ORDER — SODIUM CHLORIDE (PF) 0.9 % IJ SOLN
INTRAMUSCULAR | Status: AC
  Filled 2021-02-02: qty 50

## 2021-02-02 MED ORDER — FENTANYL CITRATE (PF) 250 MCG/5ML IJ SOLN
INTRAMUSCULAR | Status: AC
  Filled 2021-02-02: qty 5

## 2021-02-02 MED ORDER — FENTANYL CITRATE (PF) 100 MCG/2ML IJ SOLN: 25.0000 ug | INTRAMUSCULAR | Status: DC | PRN

## 2021-02-02 MED ORDER — SODIUM CHLORIDE 0.9 % IV BOLUS (SEPSIS)
700.0000 mL | Freq: Once | INTRAVENOUS | Status: AC
  Administered 2021-02-02: 700 mL via INTRAVENOUS

## 2021-02-02 MED ORDER — ENSURE ENLIVE PO LIQD: 237.0000 mL | Freq: Two times a day (BID) | ORAL | Status: DC

## 2021-02-02 MED ORDER — METOCLOPRAMIDE HCL 5 MG/ML IJ SOLN
10.0000 mg | Freq: Once | INTRAMUSCULAR | Status: AC
  Administered 2021-02-02: 10 mg via INTRAVENOUS
  Filled 2021-02-02: qty 2

## 2021-02-02 MED ORDER — TRAMADOL HCL 50 MG PO TABS
100.0000 mg | ORAL_TABLET | Freq: Four times a day (QID) | ORAL | Status: DC | PRN
  Administered 2021-02-02 – 2021-02-03 (×2): 100 mg via ORAL
  Filled 2021-02-02 (×2): qty 2

## 2021-02-02 MED ORDER — MIDAZOLAM HCL 2 MG/2ML IJ SOLN
INTRAMUSCULAR | Status: AC
  Filled 2021-02-02: qty 2

## 2021-02-02 MED ORDER — IOHEXOL 300 MG/ML  SOLN
100.0000 mL | Freq: Once | INTRAMUSCULAR | Status: AC | PRN
  Administered 2021-02-02: 100 mL via INTRAVENOUS

## 2021-02-02 MED ORDER — SODIUM CHLORIDE 0.9 % IV SOLN: Freq: Once | INTRAVENOUS | Status: AC

## 2021-02-02 MED ORDER — ACETAMINOPHEN 500 MG PO TABS
1000.0000 mg | ORAL_TABLET | Freq: Four times a day (QID) | ORAL | Status: DC
  Administered 2021-02-03 (×3): 1000 mg via ORAL
  Filled 2021-02-02 (×3): qty 2

## 2021-02-02 MED ORDER — ONDANSETRON HCL 4 MG/2ML IJ SOLN
4.0000 mg | Freq: Four times a day (QID) | INTRAMUSCULAR | Status: DC | PRN
  Administered 2021-02-03 – 2021-02-05 (×4): 4 mg via INTRAVENOUS
  Filled 2021-02-02 (×4): qty 2

## 2021-02-02 MED ORDER — SUCCINYLCHOLINE CHLORIDE 200 MG/10ML IV SOSY
PREFILLED_SYRINGE | INTRAVENOUS | Status: DC | PRN
  Administered 2021-02-02: 100 mg via INTRAVENOUS

## 2021-02-02 MED ORDER — SENNOSIDES-DOCUSATE SODIUM 8.6-50 MG PO TABS
2.0000 | ORAL_TABLET | Freq: Every day | ORAL | Status: DC
  Administered 2021-02-02: 2 via ORAL
  Filled 2021-02-02 (×3): qty 2

## 2021-02-02 MED ORDER — ACETAMINOPHEN 10 MG/ML IV SOLN
INTRAVENOUS | Status: DC | PRN
  Administered 2021-02-02: 1000 mg via INTRAVENOUS

## 2021-02-02 MED ORDER — KCL IN DEXTROSE-NACL 20-5-0.45 MEQ/L-%-% IV SOLN
INTRAVENOUS | Status: DC
Start: 2021-02-02 — End: 2021-02-04
  Filled 2021-02-02 (×5): qty 1000

## 2021-02-02 MED ORDER — DEXAMETHASONE SODIUM PHOSPHATE 10 MG/ML IJ SOLN
INTRAMUSCULAR | Status: DC | PRN
  Administered 2021-02-02: 10 mg via INTRAVENOUS

## 2021-02-02 MED ORDER — FENTANYL CITRATE (PF) 100 MCG/2ML IJ SOLN
INTRAMUSCULAR | Status: DC | PRN
  Administered 2021-02-02: 50 ug via INTRAVENOUS

## 2021-02-02 MED ORDER — 0.9 % SODIUM CHLORIDE (POUR BTL) OPTIME
TOPICAL | Status: DC | PRN
  Administered 2021-02-02: 1000 mL

## 2021-02-02 MED ORDER — ACETAMINOPHEN 10 MG/ML IV SOLN
INTRAVENOUS | Status: AC
  Filled 2021-02-02: qty 100

## 2021-02-02 MED ORDER — OXYCODONE HCL 5 MG/5ML PO SOLN: 5.0000 mg | Freq: Once | ORAL | Status: DC | PRN

## 2021-02-02 MED ORDER — LIDOCAINE 2% (20 MG/ML) 5 ML SYRINGE
INTRAMUSCULAR | Status: DC | PRN
  Administered 2021-02-02: 40 mg via INTRAVENOUS
  Administered 2021-02-02: 60 mg via INTRAVENOUS

## 2021-02-02 MED ORDER — FENTANYL CITRATE (PF) 250 MCG/5ML IJ SOLN
INTRAMUSCULAR | Status: DC | PRN
  Administered 2021-02-02 (×5): 50 ug via INTRAVENOUS

## 2021-02-02 MED ORDER — BUPIVACAINE HCL 0.25 % IJ SOLN
INTRAMUSCULAR | Status: AC
  Filled 2021-02-02: qty 1

## 2021-02-02 MED ORDER — ACETAMINOPHEN 10 MG/ML IV SOLN: 1000.0000 mg | Freq: Once | INTRAVENOUS | Status: DC | PRN

## 2021-02-02 MED ORDER — FENTANYL CITRATE (PF) 100 MCG/2ML IJ SOLN
INTRAMUSCULAR | Status: AC
  Filled 2021-02-02: qty 2

## 2021-02-02 MED ORDER — LACTATED RINGERS IV BOLUS: 1000.0000 mL | Freq: Once | INTRAVENOUS | Status: DC

## 2021-02-02 MED ORDER — HYDROMORPHONE HCL 1 MG/ML IJ SOLN
0.5000 mg | INTRAMUSCULAR | Status: DC | PRN
  Administered 2021-02-02 – 2021-02-04 (×8): 0.5 mg via INTRAVENOUS
  Filled 2021-02-02 (×8): qty 0.5

## 2021-02-02 SURGICAL SUPPLY — 28 items
BAG COUNTER SPONGE SURGICOUNT (BAG) IMPLANT
BLADE SURG 15 STRL LF DISP TIS (BLADE) ×1 IMPLANT
BLADE SURG 15 STRL SS (BLADE) ×1
BNDG GAUZE ELAST 4 BULKY (GAUZE/BANDAGES/DRESSINGS) IMPLANT
DECANTER SPIKE VIAL GLASS SM (MISCELLANEOUS) IMPLANT
DRAPE INCISE IOBAN 66X45 STRL (DRAPES) ×2 IMPLANT
DRAPE LAPAROSCOPIC ABDOMINAL (DRAPES) IMPLANT
DRSG PAD ABDOMINAL 8X10 ST (GAUZE/BANDAGES/DRESSINGS) IMPLANT
ELECT REM PT RETURN 15FT ADLT (MISCELLANEOUS) ×2 IMPLANT
GAUZE PACKING IODOFORM 1X5 (PACKING) ×2 IMPLANT
GAUZE SPONGE 4X4 12PLY STRL (GAUZE/BANDAGES/DRESSINGS) IMPLANT
GLOVE SURG ENC MOIS LTX SZ7.5 (GLOVE) ×2 IMPLANT
GOWN STRL REUS W/TWL LRG LVL3 (GOWN DISPOSABLE) ×4 IMPLANT
KIT BASIN OR (CUSTOM PROCEDURE TRAY) ×2 IMPLANT
KIT TURNOVER KIT A (KITS) ×2 IMPLANT
NEEDLE HYPO 25X1 1.5 SAFETY (NEEDLE) IMPLANT
NS IRRIG 1000ML POUR BTL (IV SOLUTION) ×2 IMPLANT
PENCIL SMOKE EVACUATOR (MISCELLANEOUS) IMPLANT
SPONGE T-LAP 18X18 ~~LOC~~+RFID (SPONGE) ×2 IMPLANT
SUT MNCRL AB 4-0 PS2 18 (SUTURE) IMPLANT
SUT VIC AB 3-0 SH 27 (SUTURE)
SUT VIC AB 3-0 SH 27XBRD (SUTURE) IMPLANT
SWAB COLLECTION DEVICE MRSA (MISCELLANEOUS) IMPLANT
SWAB CULTURE ESWAB REG 1ML (MISCELLANEOUS) IMPLANT
SYR BULB EAR ULCER 3OZ GRN STR (SYRINGE) ×2 IMPLANT
SYR CONTROL 10ML LL (SYRINGE) IMPLANT
TOWEL OR 17X26 10 PK STRL BLUE (TOWEL DISPOSABLE) ×2 IMPLANT
YANKAUER SUCT BULB TIP NO VENT (SUCTIONS) ×2 IMPLANT

## 2021-02-02 NOTE — Op Note (Signed)
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 On incision, some purulent appearing mucous and purulent thick sanguinous mucous. No definitive enteric contents noted. Areas of loculation manually explored. Fascial incision intact on gentle palpation. No areas of induration or fluctuance noted at end of surgery.  Procedure: The patient was identified in the preoperative holding area. Informed consent was signed on the chart. Patient was seen history was reviewed and exam was performed.   The patient was then taken to the operating room and placed in the supine position with SCD hose on. General anesthesia was then induced without difficulty. She was then placed in supine position. A foley catheter was placed after the urethra was cleansed with betadine. The patient was then draped after the prep was dried. An Charlie Pitter was placed over the abdomen and planned incision.  Timeout was performed the patient, procedure, antibiotic, allergy, and length of procedure.   A small cruciate incision was made over the area of fluctuance, several inches lateral to the midline incision on the left. Incision was carried down to the subcutaneous tissue. The incision was then explored manually and areas of loculation lysed with a digit. Findings as noted above. The wound was then copiously irrigated. It was packed with 1 inch iodoform gauze and  dressed with 4x4 and Tegaderm.   All instrument, suture, laparotomy, Ray-Tec, and needle counts were correct x2. The patient tolerated the procedure well and was taken recovery room in stable condition.   Jeral Pinch MD Gynecologic Oncology

## 2021-02-02 NOTE — Transfer of Care (Signed)
Immediate Anesthesia Transfer of Care Note  Patient: Sarah Newman  Procedure(s) Performed: INCISION AND DRAINAGE ABSCESS (Abdomen)  Patient Location: PACU  Anesthesia Type:General  Level of Consciousness: awake and alert   Airway & Oxygen Therapy: Patient Spontanous Breathing and Patient connected to face mask oxygen  Post-op Assessment: Report given to RN and Post -op Vital signs reviewed and stable  Post vital signs: Reviewed and stable  Last Vitals:  Vitals Value Taken Time  BP    Temp    Pulse 110 02/02/21 1759  Resp 23 02/02/21 1759  SpO2 100 % 02/02/21 1759  Vitals shown include unvalidated device data.  Last Pain:  Vitals:   02/02/21 1753  TempSrc:   PainSc: 0-No pain         Complications: No notable events documented.

## 2021-02-02 NOTE — ED Provider Notes (Signed)
[facility] DEPT Provider Note   CSN: [id number] Arrival date & time: 02/02/21  4098     History Chief Complaint  Patient presents with   Abdominal Pain    Sarah Newman is a 45 y.o. female.  HPI Patient is a 45 year old female with past medical history significant for ovarian tumor, surgically removed.  Also diabetes on metformin twice daily Also history of small bowel obstructions.  Patient was recently in the hospital and underwent surgery for small bowel obstruction due to adhesions.  Her surgery was 6/22 she was discharged on 6/24.  She states that 2 days ago she began having abdominal pain that worsened until she was forced to present to the ER because of her excruciating pain.  She states the pain is 10/10 severe worse on the left than the right but seems to cause generalized abdominal pain.  She states that she has had some vomiting.  Has been nonbloody nonbilious.  No aggravating or mitigating factors of abdominal pain apart from worse with touch and with walking or balancing.  She denies any fevers or chills lightheadedness or dizziness.  She denies any urinary frequency urgency or dysuria.  No other associate symptoms.     Past Medical History:  Diagnosis Date   Anemia due to antineoplastic chemotherapy    Arthritis    Asthma due to seasonal allergies    01-21-2021 per pt seasonal and exercised induced, does not have inhaler, last used one few years   Chronic abdominal pain 01/2021   due to partial bowel obstruction   Chronic constipation 01/2021   due to partial bowel obstruction   Chronic nausea    01-21-2021  per pt intermittant nausea and when has abd pain most of time will have vomiting   Family history of breast cancer    Family history of colon cancer    Family history of ovarian cancer    Family history of prostate cancer    History of 2019 novel coronavirus disease (COVID-19) 09/08/2020   positive results in epic, per  pt mild symptoms that resolved   History of Helicobacter pylori infection 07/2016   E[facility] w/ bx's by [facility],  chronic h.pylori w/ gastritis,  treated  (no ulcer)   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;  progression w/ abdominal carcinomatosis   Partial bowel obstruction ([place]) 01/2021   Steroid-induced diabetes ([place])    followed by dr Alvy Bimler    Patient Active Problem List   Diagnosis Date Noted   Intermittent small bowel obstruction due to adhesions ([place]) 01/23/2021   Intra-abdominal adhesions    Nausea with vomiting 01/11/2021   SBO (small bowel obstruction) ([place]) 01/11/2021   Poor dentition 12/10/2020   Pancytopenia, acquired ([place]) 12/10/2020   Allergic reaction 10/15/2020   Physical debility 10/01/2020   Steroid-induced diabetes ([place]) 09/20/2020   Constipation 09/20/2020   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   Cancer associated pain 09/14/2020   Lab test positive for detection of COVID-19 virus 09/10/2020   Hypokalemia 08/30/2020   Obesity, Class II, BMI 35-39.9 07/17/2020   Anemia, chronic disease 05/29/2020   UTI (urinary tract infection) 01/09/2020   Dysuria 01/06/2020   Anemia due to antineoplastic chemotherapy 12/05/2019   Gastritis 12/05/2019   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/2019   Weight gain 10/25/2019   Vaginal dryness 10/24/2019   Drug-induced hyperglycemia 10/03/2019  Genetic testing 09/23/2019   Bone pain 09/13/2019   Restless leg 09/13/2019   Elevated liver enzymes 09/13/2019   Family history of ovarian cancer    Family history of prostate cancer    Family history of colon cancer    Family history of breast cancer    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[place])    Pelvic mass in female 07/11/2019   Obesity 07/11/2019   Cervical high risk HPV (human papillomavirus) test positive 02/17/2017    Cholecystitis 02/16/2015    Past Surgical History:  Procedure Laterality Date   CHOLECYSTECTOMY N/A 02/16/2015   Procedure: LAPAROSCOPIC CHOLECYSTECTOMY WITH INTRAOPERATIVE CHOLANGIOGRAM;  Surgeon: Johnathan Hausen, MD;  Location: [facility];  Service: General;  Laterality: N/A;   IR IMAGING GUIDED PORT INSERTION  08/12/2019   LAPAROSCOPY N/A 01/23/2021   Procedure: LAPAROSCOPY DIAGNOSTIC;  Surgeon: Lafonda Mosses, MD;  Location: [facility];  Service: Gynecology;  Laterality: N/A;   LAPAROTOMY N/A 01/23/2021   Procedure: LAPAROTOMY, ENTEROTOMY REPAIR OF THE BOWEL;  Surgeon: Lafonda Mosses, MD;  Location[facility];  Service: Gynecology;  Laterality: N/A;   SALPINGOOPHORECTOMY N/A 07/21/2019   Procedure: EXPLORATORY LAPAROTOMY, TOTAL ABDOMINAL HYSTERECTOMY, BILATERAL SALPINGO OOPHORECTOMY, OMENTECTOMY;  Surgeon: Lafonda Mosses, MD;  Location: [facility];  Service: Gynecology;  Laterality: N/A;   UPPER GASTROINTESTINAL ENDOSCOPY  08/01/2016   by dr Corinda Gubler in epic     OB History     Gravida  3   Para  3   Term      Preterm      AB      Living  2      SAB      IAB      Ectopic      Multiple      Live Births  2           Family History  Problem Relation Age of Onset   Colon cancer Father 24   Prostate cancer Father 61   Colon cancer Paternal Aunt        dx. early 3s   Ovarian cancer Paternal Grandmother 37   Breast cancer Cousin    Endometriosis Mother    Cancer Paternal Uncle        unknown type, dx. early 16s   Cancer Paternal Uncle 77       unknown type   Esophageal cancer Neg Hx     Social History   Tobacco Use   Smoking status: Never   Smokeless tobacco: Never  Vaping Use   Vaping Use: Never used  Substance Use Topics   Alcohol use: Yes    Comment: Social drinker   Drug use: Never    Home Medications Prior to Admission medications   Medication Sig Start Date End Date Taking? Authorizing Provider   acetaminophen (TYLENOL) 500 MG tablet Take 1,000 mg by mouth every 6 (six) hours as needed for moderate pain.    Yes [provider]  lidocaine-prilocaine (EMLA) cream APPLY TO THE AFFECTED AREA AS DIRECTED TO ACCESS PORT Patient not taking: Reported on 02/02/2021 10/22/20 10/22/21  Heath Lark, MD  metFORMIN (GLUCOPHAGE) 500 MG tablet TAKE 1 TABLET BY MOUTH TWICE DAILY WITH A MEAL Patient not taking: Reported on 02/02/2021 12/03/20 12/03/21  Heath Lark, MD  ondansetron (ZOFRAN ODT) 4 MG disintegrating tablet Take 1 tablet (4 mg total) by mouth every 4 (four) hours as needed for nausea or vomiting. Patient not taking: Reported on 02/02/2021 01/11/21  Charlesetta Shanks, MD  ondansetron (ZOFRAN) 8 MG tablet TAKE 1 TABLET BY MOUTH EVERY 8 HOURS AS NEEDED FOR NAUSEA OR VOMITING Patient not taking: Reported on 02/02/2021 10/22/20 10/22/21  Heath Lark, MD  oxyCODONE (OXY IR/ROXICODONE) 5 MG immediate release tablet Take 1-2 tablets (5-10 mg total) by mouth every 4 (four) hours as needed for severe pain. For AFTER surgery, do not take and drive Patient not taking: Reported on 02/02/2021 01/17/21   Joylene John D, NP    Allergies    Carboplatin  Review of Systems   Review of Systems  Constitutional:  Negative for chills and fever.  HENT:  Negative for congestion.   Eyes:  Negative for pain.  Respiratory:  Negative for cough and shortness of breath.   Cardiovascular:  Negative for chest pain and leg swelling.  Gastrointestinal:  Positive for abdominal pain, nausea and vomiting. Negative for diarrhea.  Genitourinary:  Negative for dysuria.  Musculoskeletal:  Negative for myalgias.  Skin:  Negative for rash.  Neurological:  Negative for dizziness and headaches.   Physical Exam Updated Vital Signs BP (!) 148/107 (BP Location: Right Arm)   Pulse (!) 114   Temp 98.7 F (37.1 C) (Oral)   Resp (!) 23   Ht 5\' 2"  (1.575 m)   Wt 90.3 kg   LMP 07/05/2019   SpO2 98%   BMI 36.40 kg/m   Physical  Exam Vitals and nursing note reviewed.  Constitutional:      General: She is in acute distress.     Appearance: She is obese.     Comments: Very uncomfortable appearing 45 year old female Speaking in full sentences  HENT:     Head: Normocephalic and atraumatic.     Nose: Nose normal.  Eyes:     General: No scleral icterus. Cardiovascular:     Rate and Rhythm: Regular rhythm. Tachycardia present.     Pulses: Normal pulses.     Heart sounds: Normal heart sounds.  Pulmonary:     Effort: Pulmonary effort is normal. No respiratory distress.     Breath sounds: Normal breath sounds. No wheezing.  Abdominal:     Palpations: Abdomen is soft.     Tenderness: There is abdominal tenderness.     Comments: Abdomen is soft.  There is diffuse tenderness which is significant unable to palpate deeply without severe discomfort.  There is rebound tenderness present.  Musculoskeletal:     Cervical back: Normal range of motion.     Right lower leg: No edema.     Left lower leg: No edema.  Skin:    General: Skin is warm and dry.     Capillary Refill: Capillary refill takes less than 2 seconds.  Neurological:     Mental Status: She is alert. Mental status is at baseline.  Psychiatric:        Mood and Affect: Mood normal.        Behavior: Behavior normal.    ED Results / Procedures / Treatments   Labs (all labs ordered are listed, but only abnormal results are displayed) Labs Reviewed  CBC WITH DIFFERENTIAL/PLATELET - Abnormal; Notable for the following components:      Result Value   WBC 24.0 (*)    Neutro Abs 21.7 (*)    Abs Immature Granulocytes 0.12 (*)    All other components within normal limits  COMPREHENSIVE METABOLIC PANEL - Abnormal; Notable for the following components:   Glucose, Bld 130 (*)    Total Protein 8.6 (*)  All other components within normal limits  RESP PANEL BY RT-PCR (FLU A&B, COVID) ARPGX2  URINE CULTURE  CULTURE, BLOOD (SINGLE)  CULTURE, BLOOD (SINGLE)   LIPASE, BLOOD  LACTIC ACID, PLASMA  PROTIME-INR  APTT  URINALYSIS, ROUTINE W REFLEX MICROSCOPIC  LACTIC ACID, PLASMA  I-STAT BETA HCG BLOOD, ED ([facility], [facility], [facility] ONLY)    EKG EKG Interpretation  Date/Time:  Saturday February 02 2021 12:37:46 EDT Ventricular Rate:  108 PR Interval:  140 QRS Duration: 77 QT Interval:  391 QTC Calculation: 525 R Axis:   81 Text Interpretation: Sinus tachycardia Borderline abnrm T, anterolateral leads Prolonged QT interval No acute changes No old tracing to compare Confirmed by Varney Biles [phone]) on 02/02/2021 1:20:57 PM  Radiology CT ABDOMEN PELVIS W CONTRAST  Result Date: 02/02/2021 CLINICAL DATA:  Abdominal pain. Surgery for bowel obstruction on 01/23/2021. Vomiting. EXAM: CT ABDOMEN AND PELVIS WITH CONTRAST TECHNIQUE: Multidetector CT imaging of the abdomen and pelvis was performed using the standard protocol following bolus administration of intravenous contrast. CONTRAST:  182mL OMNIPAQUE IOHEXOL 300 MG/ML  SOLN COMPARISON:  01/11/2021 FINDINGS: Lower chest: Unremarkable. Hepatobiliary: No suspicious focal abnormality within the liver parenchyma. Gallbladder is surgically absent. Common bile duct upper normal diameter at 6 mm, similar to prior. Pancreas: No focal mass lesion. No dilatation of the main duct. No intraparenchymal cyst. No peripancreatic edema. Spleen: No splenomegaly. No focal mass lesion. Adrenals/Urinary Tract: No adrenal nodule or mass. Kidneys unremarkable. No evidence for hydroureter. The urinary bladder appears normal for the degree of distention. Stomach/Bowel: Stomach is nondistended. Duodenum is normally positioned as is the ligament of Treitz. No small bowel wall thickening. No small bowel dilatation. The terminal ileum is normal. Appendix is 8 mm diameter, similar to prior without periappendiceal edema or inflammation. No gross colonic mass. No colonic wall thickening. Vascular/Lymphatic: No abdominal aortic aneurysm. No abdominal  lymphadenopathy. No pelvic sidewall lymphadenopathy. Reproductive: Uterus surgically absent.  There is no adnexal mass. Other: There is some trace free fluid in the anterior left pelvis (66/2). Multiple small bowel adhesions noted left anterior abdomen. Musculoskeletal: A collection of gas and edema is identified in the left paraumbilical anterior abdominal wall, measuring approximately 8.7 x 5.9 x 8.6 cm. This is unorganized without a rim or significant fluid component. Gas does appear to track deep into the left rectus sheath (axial 49/2) with tiny gas bubbles that appear to be in the preperitoneal space near the midline (48/2 and 47/2). IMPRESSION: 1. Ill-defined collection of gas and edema is identified in the subcutaneous fat of the left paraumbilical anterior abdominal wall. This is unorganized without a rim or significant fluid component. Gas tracks deep into the left rectus sheath with tiny gas bubbles that appear to be in the preperitoneal space near the midline. Small bowel loops in this region appear adhesed anteriorly. Imaging features may be related to cellulitis. Evolving enterocutaneous is not excluded. 2. No evidence for small bowel obstruction with trace free fluid in the anterior left pelvis. 3. Status post cholecystectomy and hysterectomy. Electronically Signed   By: Misty Stanley M.D.   On: 02/02/2021 12:07    Procedures .Critical Care  Date/Time: 02/02/2021 2:49 PM Performed by: Tedd Sias, PA Authorized by: Tedd Sias, PA   Critical care provider statement:    Critical care time (minutes):  35   Critical care time was exclusive of:  Separately billable procedures and treating other patients and teaching time   Critical care was necessary to treat or prevent  imminent or life-threatening deterioration of the following conditions: Sepsis secondary to intra-abdominal infection.   Critical care was time spent personally by me on the following activities:  Discussions with  consultants, evaluation of patient's response to treatment, examination of patient, review of old charts, re-evaluation of patient's condition, pulse oximetry, ordering and review of radiographic studies, ordering and review of laboratory studies and ordering and performing treatments and interventions   I assumed direction of critical care for this patient from another provider in my specialty: no     Medications Ordered in ED Medications  sodium chloride (PF) 0.9 % injection (has no administration in time range)  lactated ringers bolus 1,000 mL (has no administration in time range)  lactated ringers infusion (has no administration in time range)  0.9 %  sodium chloride infusion ( Intravenous Stopped 02/02/21 1336)  metoCLOPramide (REGLAN) injection 10 mg (10 mg Intravenous Given 02/02/21 1041)  diphenhydrAMINE (BENADRYL) injection 12.5 mg (12.5 mg Intravenous Given 02/02/21 1041)  HYDROmorphone (DILAUDID) injection 0.5 mg (0.5 mg Intravenous Given 02/02/21 1041)  iohexol (OMNIPAQUE) 300 MG/ML solution 100 mL (100 mLs Intravenous Contrast Given 02/02/21 1140)  sodium chloride 0.9 % bolus 700 mL (0 mLs Intravenous Stopped 02/02/21 1542)  ceFEPIme (MAXIPIME) 2 g in sodium chloride 0.9 % 100 mL IVPB (0 g Intravenous Stopped 02/02/21 1518)  metroNIDAZOLE (FLAGYL) IVPB 500 mg (0 mg Intravenous Stopped 02/02/21 1518)  HYDROmorphone (DILAUDID) injection 1 mg (1 mg Intravenous Given 02/02/21 1334)    ED Course  I have reviewed the triage vital signs and the nursing notes.  Pertinent labs & imaging results that were available during my care of the patient were reviewed by me and considered in my medical decision making (see chart for details).  Patient is a 45 year old female presented today with tachycardia, is afebrile, not tachypneic.  Speaking in full sentences.  Appears to be extremely uncomfortable.  Initial consideration for sepsis given patient has concerning abdominal exam and is tachycardic however she is  afebrile and without tachypnea.  Respiratory rate is 17 on examination by RN and similar for me.  Lab work obtained including CBC, CMP COVID, coags, lactic.  Will obtain CT scan with contrast.  CBC with leukocytosis of 24 with left shift.  Will initiate code sepsis.  Discussed with Ankit antibody.  Patient is received 2 L of LR and 0.5 of Dilaudid.  We will give an additional dose of Dilaudid and increase IV fluids for a total of 30 mL/kg bolus.  Flagyl and cefepime empiric antibiotics begun.  CMP unremarkable.  I-STAT hCG negative for pregnancy lipase within normal limits coags within normal limits.  Initial lactic not significantly elevated.  Blood cultures in process.  CT abdomen pelvis seems to show a abscess which is likely a surgical complication.  Patient is septic from this abscess.   Clinical Course as of 02/02/21 1613  Sat Feb 02, 2021  1316 Discussed with Delsa Sale - will talk to Dr. Berline Lopes who did surgery. Will call me back [WF]    Clinical Course User Index [WF] Tedd Sias, PA   MDM Rules/Calculators/A&P                          Patient will be taken to the OR by Lahoma Crocker.   Briefly discussed with S Joy  PA who is taking over at shift change   Final Clinical Impression(s) / ED Diagnoses Final diagnoses:  Abscess    Rx /  DC Orders ED Discharge Orders     None        Tedd Sias, [place] 02/02/21 [place], MD 02/04/21 [phone]

## 2021-02-02 NOTE — Anesthesia Procedure Notes (Signed)
Date/Time: 02/02/2021 5:45 PM Performed by: Cynda Familia, CRNA Oxygen Delivery Method: Simple face mask Placement Confirmation: positive ETCO2 and breath sounds checked- equal and bilateral Dental Injury: Teeth and Oropharynx as per pre-operative assessment

## 2021-02-02 NOTE — Progress Notes (Signed)
A consult was received from an ED provider for Cefepime per pharmacy dosing.  The patient's profile has been reviewed for ht/wt/allergies/indication/available labs.    A one time order has been placed for Cefepime 2g IV.  Further antibiotics/pharmacy consults should be ordered by admitting physician if indicated.                       Thank you, Luiz Ochoa 02/02/2021  1:25 PM

## 2021-02-02 NOTE — ED Notes (Signed)
OR called to report readiness for Pt.  Pt's husband will need to be directed to the OR waiting area.

## 2021-02-02 NOTE — ED Triage Notes (Signed)
Patient reports she had surgery for bowel obstruction on 6/22 and for past two days has had sharp pain in left abdomen. Pain is 10/10, endorses vomiting.

## 2021-02-02 NOTE — Op Note (Addendum)
I participated in this procedure with Dr. Berline Lopes as a Co-surgeon.  Preop Dx: Subcutaneous abscess, possible ECF Postop Dx: Same  Procedure: Incision and drainage of subcutaneous abdominal abscess, 6 x 8 cm.  Procedure: She was prepped and draped in the standard sterile fashion. A timeout was called indicating the correct patient, procedure, antibiotics and our surgical plan. We made the decision to make an off-midline cruciate incision over the area of greatest induration, to the left of the umbilicus, with the thought process being to keep the process well lateral to the umbilicus in the event that there was ongoing/prolonged wound drainage it would be easier to manage and control. The subcutaneous gas/fluid pocket seen on imaging was entered and carefully bluntly explored, breaking up all septations and loculations. Some purulent fluid was drained along with some mucoid material. Cultures are obtained. The wound was explored with retractors and no clear bowel or mucosa is visible nor is there any substantial amounts of succus. The cavity is ~6 x 8 cm in size. The surrounding tissues are clearly viable in appearance. The overlying skin is normal in appearance. There is no evident fascial defect either. The wound was copiously irrigated and cleaned up nicely. Hemostasis was achieved. The corners of the wound were trimmed to facilitate drainage. The wound was packed with 1 strip of iodoform gauze.  All sponge, needle and instrument counts are reported correct x2.  She was awakened from anesthesia and transferred to a stretcher for transport to PACU in satisfactory condition.  Postoperative plan being for PO contrasted CT scan tomorrow. Our service remains available to assist in her care at the discretion of Dr. Berline Lopes.  EBL: 3 mL Complications: none

## 2021-02-02 NOTE — Anesthesia Procedure Notes (Signed)
Procedure Name: Intubation Date/Time: 02/02/2021 4:48 PM Performed by: Cynda Familia, CRNA Pre-anesthesia Checklist: Patient identified, Emergency Drugs available, Suction available and Patient being monitored Patient Re-evaluated:Patient Re-evaluated prior to induction Oxygen Delivery Method: Circle System Utilized Preoxygenation: Pre-oxygenation with 100% oxygen Induction Type: IV induction, Rapid sequence and Cricoid Pressure applied Ventilation: Mask ventilation without difficulty Laryngoscope Size: Miller and 2 Grade View: Grade I Tube type: Oral Tube size: 7.0 mm Number of attempts: 1 Airway Equipment and Method: Stylet Placement Confirmation: ETT inserted through vocal cords under direct vision, positive ETCO2 and breath sounds checked- equal and bilateral Secured at: 21 cm Tube secured with: Tape Dental Injury: Teeth and Oropharynx as per pre-operative assessment  Comments: Smooth IV induction Sarah Newman- intubation AM CRNA atraumatic- teeth and mouth as preop bilat BS

## 2021-02-02 NOTE — H&P (Signed)
Sarah Newman is an 45 y.o. female.   Chief Complaint: abdominal pain HPI: She presents s/p a diagnostic laparoscopy complicated by enterotomy w/repair on 6/24 for a SBO--the sequelae of a stage IIIA2 EOC.  She had onset of abdominal pain and noted a hard area near the incision several days ago.  Rare emesis.  Last BM 2 days ago, normal.  Last able to tolerate a soft diet 1 - 2 days ago.  She denies any fever or drainage from the incision.    Past Medical History:  Diagnosis Date   Anemia due to antineoplastic chemotherapy    Arthritis    Asthma due to seasonal allergies    01-21-2021 per pt seasonal and exercised induced, does not have inhaler, last used one few years   Chronic abdominal pain 01/2021   due to partial bowel obstruction   Chronic constipation 01/2021   due to partial bowel obstruction   Chronic nausea    01-21-2021  per pt intermittant nausea and when has abd pain most of time will have vomiting   Family history of breast cancer    Family history of colon cancer    Family history of ovarian cancer    Family history of prostate cancer    History of 2019 novel coronavirus disease (COVID-19) 09/08/2020   positive results in epic, per pt mild symptoms that resolved   History of Helicobacter pylori infection 07/2016   E[facility] w/ bx's by [facility],  chronic h.pylori w/ gastritis,  treated  (no ulcer)   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;  progression w/ abdominal carcinomatosis   Partial bowel obstruction ([place]) 01/2021   Steroid-induced diabetes ([place])    followed by dr Alvy Bimler    Past Surgical History:  Procedure Laterality Date   CHOLECYSTECTOMY N/A 02/16/2015   Procedure: LAPAROSCOPIC CHOLECYSTECTOMY WITH INTRAOPERATIVE CHOLANGIOGRAM;  Surgeon: Johnathan Hausen, MD;  Location: [facility];  Service: General;  Laterality: N/A;   IR  IMAGING GUIDED PORT INSERTION  08/12/2019   LAPAROSCOPY N/A 01/23/2021   Procedure: LAPAROSCOPY DIAGNOSTIC;  Surgeon: Lafonda Mosses, MD;  Location: [facility];  Service: Gynecology;  Laterality: N/A;   LAPAROTOMY N/A 01/23/2021   Procedure: LAPAROTOMY, ENTEROTOMY REPAIR OF THE BOWEL;  Surgeon: Lafonda Mosses, MD;  Location: [facility];  Service: Gynecology;  Laterality: N/A;   SALPINGOOPHORECTOMY N/A 07/21/2019   Procedure: EXPLORATORY LAPAROTOMY, TOTAL ABDOMINAL HYSTERECTOMY, BILATERAL SALPINGO OOPHORECTOMY, OMENTECTOMY;  Surgeon: Lafonda Mosses, MD;  Location: [facility];  Service: Gynecology;  Laterality: N/A;   UPPER GASTROINTESTINAL ENDOSCOPY  08/01/2016   by dr Corinda Gubler in epic    Family History  Problem Relation Age of Onset   Colon cancer Father 97   Prostate cancer Father 57   Colon cancer Paternal Aunt        dx. early 37s   Ovarian cancer Paternal Grandmother 15   Breast cancer Cousin    Endometriosis Mother    Cancer Paternal Uncle        unknown type, dx. early 52s   Cancer Paternal Uncle 21       unknown type   Esophageal cancer Neg Hx    Social History:  reports that she has never smoked. She has never used smokeless tobacco. She reports current alcohol use. She reports that she does not use drugs.  Allergies:  Allergies  Allergen Reactions   Carboplatin Shortness Of Breath    (Not in a hospital admission)   Results for orders placed or performed during the hospital encounter of 02/02/21 (from the past 48 hour(s))  CBC with Differential/Platelet     Status: Abnormal   Collection Time: 02/02/21 10:51 AM  Result Value Ref Range   WBC 24.0 (H) 4.0 - 10.5 K/uL   RBC 4.30 3.87 - 5.11 MIL/uL   Hemoglobin 12.1 12.0 - 15.0 g/dL   HCT 37.6 36.0 - 46.0 %   MCV 87.4 80.0 - 100.0 fL   MCH 28.1 26.0 - 34.0 pg   MCHC 32.2 30.0 - 36.0 g/dL   RDW 14.6 11.5 - 15.5 %   Platelets 257 150 - 400 K/uL   nRBC 0.0 0.0 - 0.2 %    Neutrophils Relative % 90 %   Neutro Abs 21.7 (H) 1.7 - 7.7 K/uL   Lymphocytes Relative 5 %   Lymphs Abs 1.1 0.7 - 4.0 K/uL   Monocytes Relative 4 %   Monocytes Absolute 1.0 0.1 - 1.0 K/uL   Eosinophils Relative 0 %   Eosinophils Absolute 0.0 0.0 - 0.5 K/uL   Basophils Relative 0 %   Basophils Absolute 0.0 0.0 - 0.1 K/uL   Immature Granulocytes 1 %   Abs Immature Granulocytes 0.12 (H) 0.00 - 0.07 K/uL    Comment: Performed at [facility], [place] [address], [place], [place] [address]  Comprehensive metabolic panel     Status: Abnormal   Collection Time: 02/02/21 10:51 AM  Result Value Ref Range   Sodium 137 135 - 145 mmol/L   Potassium 3.6 3.5 - 5.1 mmol/L   Chloride 102 98 - 111 mmol/L   CO2 23 22 - 32 mmol/L   Glucose, Bld 130 (H) 70 - 99 mg/dL    Comment: Glucose reference range applies only to samples taken after fasting for at least 8 hours.   BUN 9 6 - 20 mg/dL   Creatinine, Ser 0.62 0.44 - 1.00 mg/dL   Calcium 9.6 8.9 - 10.3 mg/dL   Total Protein 8.6 (H) 6.5 - 8.1 g/dL   Albumin 4.2 3.5 - 5.0 g/dL   AST 17 15 - 41 U/L   ALT 17 0 - 44 U/L   Alkaline Phosphatase 107 38 - 126 U/L   Total Bilirubin 0.8 0.3 - 1.2 mg/dL   GFR, Estimated >60 >60 mL/min    Comment: (NOTE) Calculated using the CKD-EPI Creatinine Equation (2021)    Anion gap 12 5 - 15    Comment: Performed at [facility], [place] [address] [place], [place] [address]  Lipase, blood     Status: None   Collection Time: 02/02/21 10:51 AM  Result Value Ref Range   Lipase 26 11 - 51 U/L    Comment: Performed at [facility], [place] [address] [place], [place] [address]  I-Stat Beta hCG blood, ED ([facility], [facility], [facility] only)     Status: None   Collection Time: 02/02/21 10:56 AM  Result Value Ref Range   I-stat hCG, quantitative <5.0 <5 mIU/mL   Comment 3            Comment:   GEST. AGE      CONC.  (mIU/mL)   <=1 WEEK        5 - 50     2 WEEKS       50 - 500     3 WEEKS  100 - 10,000     4 WEEKS     1,000 - 30,000        FEMALE AND NON-PREGNANT FEMALE:     LESS THAN 5 mIU/mL   Resp Panel by RT-PCR (Flu A&B, Covid) Nasopharyngeal Swab     Status: None   Collection Time: 02/02/21 11:07 AM   Specimen: Nasopharyngeal Swab; Nasopharyngeal(NP) swabs in vial transport medium  Result Value Ref Range   SARS Coronavirus 2 by RT PCR NEGATIVE NEGATIVE    Comment: (NOTE) SARS-CoV-2 target nucleic acids are NOT DETECTED.  The SARS-CoV-2 RNA is generally detectable in upper respiratory specimens during the acute phase of infection. The lowest concentration of SARS-CoV-2 viral copies this assay can detect is 138 copies/mL. A negative result does not preclude SARS-Cov-2 infection and should not be used as the sole basis for treatment or other patient management decisions. A negative result may occur with  improper specimen collection/handling, submission of specimen other than nasopharyngeal swab, presence of viral mutation(s) within the areas targeted by this assay, and inadequate number of viral copies(<138 copies/mL). A negative result must be combined with clinical observations, patient history, and epidemiological information. The expected result is Negative.  Fact Sheet for Patients:  EntrepreneurPulse.com.au  Fact Sheet for Healthcare Providers:  IncredibleEmployment.be  This test is no t yet approved or cleared by the [place] FDA and  has been authorized for detection and/or diagnosis of SARS-CoV-2 by FDA under an Emergency Use Authorization (EUA). This EUA will remain  in effect (meaning this test can be used) for the duration of the COVID-19 declaration under Section 564(b)(1) of the Act, 21 U.S.C.section 360bbb-3(b)(1), unless the authorization is terminated  or revoked sooner.       Influenza A by PCR NEGATIVE NEGATIVE   Influenza B by PCR NEGATIVE NEGATIVE    Comment: (NOTE) The Xpert Xpress  SARS-CoV-2/FLU/RSV plus assay is intended as an aid in the diagnosis of influenza from Nasopharyngeal swab specimens and should not be used as a sole basis for treatment. Nasal washings and aspirates are unacceptable for Xpert Xpress SARS-CoV-2/FLU/RSV testing.  Fact Sheet for Patients: EntrepreneurPulse.com.au  Fact Sheet for Healthcare Providers: IncredibleEmployment.be  This test is not yet approved or cleared by the [place] FDA and has been authorized for detection and/or diagnosis of SARS-CoV-2 by FDA under an Emergency Use Authorization (EUA). This EUA will remain in effect (meaning this test can be used) for the duration of the COVID-19 declaration under Section 564(b)(1) of the Act, 21 U.S.C. section 360bbb-3(b)(1), unless the authorization is terminated or revoked.  Performed at [facility], [place] [address] [place], [place] [address]   Protime-INR     Status: None   Collection Time: 02/02/21 12:09 PM  Result Value Ref Range   Prothrombin Time 13.5 11.4 - 15.2 seconds   INR 1.0 0.8 - 1.2    Comment: (NOTE) INR goal varies based on device and disease states. Performed at [facility], [place] [address] [place], [place] [address]   APTT     Status: None   Collection Time: 02/02/21 12:09 PM  Result Value Ref Range   aPTT 31 24 - 36 seconds    Comment: Performed at [facility], [place] [address], [place], [place] [address]  Lactic acid, plasma     Status: None   Collection Time: 02/02/21 12:41 PM  Result Value Ref Range   Lactic Acid, Venous 1.4 0.5 - 1.9 mmol/L  Comment: Performed at [facility], [place] [address] [place], [place] [address]   CT ABDOMEN PELVIS W CONTRAST  Result Date: 02/02/2021 CLINICAL DATA:  Abdominal pain. Surgery for bowel obstruction on 01/23/2021. Vomiting. EXAM: CT ABDOMEN AND PELVIS WITH CONTRAST TECHNIQUE: Multidetector CT  imaging of the abdomen and pelvis was performed using the standard protocol following bolus administration of intravenous contrast. CONTRAST:  163mL OMNIPAQUE IOHEXOL 300 MG/ML  SOLN COMPARISON:  01/11/2021 FINDINGS: Lower chest: Unremarkable. Hepatobiliary: No suspicious focal abnormality within the liver parenchyma. Gallbladder is surgically absent. Common bile duct upper normal diameter at 6 mm, similar to prior. Pancreas: No focal mass lesion. No dilatation of the main duct. No intraparenchymal cyst. No peripancreatic edema. Spleen: No splenomegaly. No focal mass lesion. Adrenals/Urinary Tract: No adrenal nodule or mass. Kidneys unremarkable. No evidence for hydroureter. The urinary bladder appears normal for the degree of distention. Stomach/Bowel: Stomach is nondistended. Duodenum is normally positioned as is the ligament of Treitz. No small bowel wall thickening. No small bowel dilatation. The terminal ileum is normal. Appendix is 8 mm diameter, similar to prior without periappendiceal edema or inflammation. No gross colonic mass. No colonic wall thickening. Vascular/Lymphatic: No abdominal aortic aneurysm. No abdominal lymphadenopathy. No pelvic sidewall lymphadenopathy. Reproductive: Uterus surgically absent.  There is no adnexal mass. Other: There is some trace free fluid in the anterior left pelvis (66/2). Multiple small bowel adhesions noted left anterior abdomen. Musculoskeletal: A collection of gas and edema is identified in the left paraumbilical anterior abdominal wall, measuring approximately 8.7 x 5.9 x 8.6 cm. This is unorganized without a rim or significant fluid component. Gas does appear to track deep into the left rectus sheath (axial 49/2) with tiny gas bubbles that appear to be in the preperitoneal space near the midline (48/2 and 47/2). IMPRESSION: 1. Ill-defined collection of gas and edema is identified in the subcutaneous fat of the left paraumbilical anterior abdominal wall. This is  unorganized without a rim or significant fluid component. Gas tracks deep into the left rectus sheath with tiny gas bubbles that appear to be in the preperitoneal space near the midline. Small bowel loops in this region appear adhesed anteriorly. Imaging features may be related to cellulitis. Evolving enterocutaneous is not excluded. 2. No evidence for small bowel obstruction with trace free fluid in the anterior left pelvis. 3. Status post cholecystectomy and hysterectomy. Electronically Signed   By: Misty Stanley M.D.   On: 02/02/2021 12:07    Review of Systems  Constitutional:  Negative for chills and fever.  Respiratory:  Negative for shortness of breath.   Gastrointestinal:  Positive for abdominal pain and vomiting. Negative for diarrhea and nausea.   Blood pressure (!) 148/107, pulse (!) 114, temperature 98.7 F (37.1 C), temperature source Oral, resp. rate (!) 23, height 5\' 2"  (1.575 m), weight 90.3 kg, last menstrual period 07/05/2019, SpO2 98 %. Physical Exam Constitutional:      Appearance: She is not toxic-appearing.  Cardiovascular:     Rate and Rhythm: Tachycardia present.  Abdominal:     General: There is no distension.     Comments: Incision intact, left-sided firm area/indurated--exquisitely tender. Remainder of exam soft, ND  Neurological:     Mental Status: She is alert and oriented to person, place, and time.     Assessment/Plan H/O an advanced stage EOC recently complicated by an SBO.  Now with a postoperative wound infection following her most recent surgery.  Low suspicion for an enterocutaneous fistula  >  admit >broad spectrum abx >to OR for exploration of the surgical wound, possible intraop consult w/GI surgery  Lahoma Crocker, MD 02/02/2021, 4:14 PM

## 2021-02-02 NOTE — ED Notes (Signed)
Surgeon at bedside and consent signed.

## 2021-02-02 NOTE — Anesthesia Preprocedure Evaluation (Addendum)
Anesthesia Evaluation  Patient identified by MRN, date of birth, ID band Patient awake    Reviewed: Allergy & Precautions, NPO status , Patient's Chart, lab work & pertinent test results  Airway Mallampati: III  TM Distance: >3 FB Neck ROM: Full    Dental  (+) Dental Advisory Given, Teeth Intact   Pulmonary asthma ,    Pulmonary exam normal        Cardiovascular negative cardio ROS   Rhythm:Regular Rate:Normal     Neuro/Psych negative neurological ROS  negative psych ROS   GI/Hepatic negative GI ROS, Neg liver ROS,   Endo/Other  diabetes, Type 2, Oral Hypoglycemic Agents  Renal/GU      Musculoskeletal  (+) Arthritis ,   Abdominal (+) + obese,   Peds  Hematology negative hematology ROS (+)   Anesthesia Other Findings   Reproductive/Obstetrics                           Anesthesia Physical Anesthesia Plan  ASA: 2  Anesthesia Plan: General   Post-op Pain Management:    Induction: Intravenous, Rapid sequence and Cricoid pressure planned  PONV Risk Score and Plan: 4 or greater and Ondansetron, Dexamethasone, Midazolam and Scopolamine patch - Pre-op  Airway Management Planned: Oral ETT  Additional Equipment: None  Intra-op Plan:   Post-operative Plan: Extubation in OR  Informed Consent: I have reviewed the patients History and Physical, chart, labs and discussed the procedure including the risks, benefits and alternatives for the proposed anesthesia with the patient or authorized representative who has indicated his/her understanding and acceptance.       Plan Discussed with: CRNA  Anesthesia Plan Comments:        Anesthesia Quick Evaluation

## 2021-02-02 NOTE — Sepsis Progress Note (Signed)
Sepsis protocol is being followed by [facility]. 

## 2021-02-02 NOTE — ED Notes (Signed)
Pt c/o constant sharp abdominal pain x 2 days.  Sts recent surgery for bowel obstruction.  Obstruction caused by scar tissue.  Sts pain increases w/ movement. Hx of ovarian CA.

## 2021-02-02 NOTE — ED Notes (Signed)
ED Provider at bedside. 

## 2021-02-02 NOTE — Progress Notes (Signed)
Pharmacy Antibiotic Note  Sarah Newman is a 45 y.o. female admitted on 02/02/2021 with  wound infection .  Pharmacy has been consulted for piperacillin/tazobactam dosing.  Today, 02/02/21 WBC elevated SCr 0.62, CrCl >80 mL/min Afebrile  Plan: Piperacillin/tazobactam 3.375 g IV q8h EI Monitor renal function and culture data  Height: 5\' 2"  (157.5 cm) Weight: 90.3 kg (199 lb) IBW/kg (Calculated) : 50.1  Temp (24hrs), Avg:98.9 F (37.2 C), Min:98.7 F (37.1 C), Max:99 F (37.2 C)  Recent Labs  Lab 02/02/21 1051 02/02/21 1241  WBC 24.0*  --   CREATININE 0.62  --   LATICACIDVEN  --  1.4    Estimated Creatinine Clearance: 94.8 mL/min (by C-G formula based on SCr of 0.62 mg/dL).    Allergies  Allergen Reactions   Carboplatin Shortness Of Breath    Antimicrobials this admission: Piperacillin/tazobactam 7/2 >>  Cefepime/flagyl x1 7/2  Dose adjustments this admission:  Microbiology results: 7/2 BCx:  7/2 UCx:    Thank you for allowing pharmacy to be a part of this patient's care.  Lenis Noon, PharmD 02/02/2021 5:58 PM

## 2021-02-02 NOTE — Consult Note (Signed)
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 Berline Lopes  HPI: Sarah Newman is an 45 y.o. female whom underwent diagnostic laparoscopy converted to open 01/25/21 with Dr. Berline Lopes. 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   She has hx of ovarian CA stage III c/b SBO in past as well.   She presented to ED today with 2-3d hx of worsening pain about her incision. Denies fevers or drainage from the wound.   Past Medical History:  Diagnosis Date   Anemia due to antineoplastic chemotherapy    Arthritis    Asthma due to seasonal allergies    01-21-2021 per pt seasonal and exercised induced, does not have inhaler, last used one few years   Chronic abdominal pain 01/2021   due to partial bowel obstruction   Chronic constipation 01/2021   due to partial bowel obstruction   Chronic nausea    01-21-2021  per pt intermittant nausea and when has abd pain most of time will have vomiting   Family history of breast cancer    Family history of colon cancer    Family history of ovarian cancer    Family history of prostate cancer    History of 2019 novel coronavirus disease (COVID-19) 09/08/2020   positive results in epic, per pt mild symptoms that resolved   History of Helicobacter pylori infection 07/2016   EGD w/ bx's by [facility],  chronic h.pylori w/ gastritis,  treated  (no ulcer)   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[place]) 07/2019   oncologist--- dr gorsuch/ dr Berline Lopes--- 07-21-2019 s/p exp. 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;  progression w/ abdominal carcinomatosis   Partial bowel obstruction ([place]) 01/2021   Steroid-induced diabetes ([place])    followed by dr Alvy Bimler    Past Surgical History:  Procedure Laterality Date   CHOLECYSTECTOMY N/A 02/16/2015   Procedure: LAPAROSCOPIC CHOLECYSTECTOMY WITH INTRAOPERATIVE  CHOLANGIOGRAM;  Surgeon: Johnathan Hausen, MD;  Location: [facility];  Service: General;  Laterality: N/A;   IR IMAGING GUIDED PORT INSERTION  08/12/2019   LAPAROSCOPY N/A 01/23/2021   Procedure: LAPAROSCOPY DIAGNOSTIC;  Surgeon: Lafonda Mosses, MD;  Location: [facility];  Service: Gynecology;  Laterality: N/A;   LAPAROTOMY N/A 01/23/2021   Procedure: LAPAROTOMY, ENTEROTOMY REPAIR OF THE BOWEL;  Surgeon: Lafonda Mosses, MD;  Location: [facility];  Service: Gynecology;  Laterality: N/A;   SALPINGOOPHORECTOMY N/A 07/21/2019   Procedure: EXPLORATORY LAPAROTOMY, TOTAL ABDOMINAL HYSTERECTOMY, BILATERAL SALPINGO OOPHORECTOMY, OMENTECTOMY;  Surgeon: Lafonda Mosses, MD;  Location: [facility];  Service: Gynecology;  Laterality: N/A;   UPPER GASTROINTESTINAL ENDOSCOPY  08/01/2016   by dr Corinda Gubler in epic    Family History  Problem Relation Age of Onset   Colon cancer Father 53   Prostate cancer Father 3   Colon cancer Paternal Aunt        dx. early 44s   Ovarian cancer Paternal Grandmother 25   Breast cancer Cousin    Endometriosis Mother    Cancer Paternal Uncle        unknown type, dx. early 22s   Cancer Paternal Uncle 74       unknown type   Esophageal cancer Neg Hx     Social:  reports that she has never smoked. She has never used smokeless tobacco. She reports current alcohol use. She reports that she does not  use drugs.  Allergies:  Allergies  Allergen Reactions   Carboplatin Shortness Of Breath    Medications: I have reviewed the patient's current medications.  Results for orders placed or performed during the hospital encounter of 02/02/21 (from the past 48 hour(s))  CBC with Differential/Platelet     Status: Abnormal   Collection Time: 02/02/21 10:51 AM  Result Value Ref Range   WBC 24.0 (H) 4.0 - 10.5 K/uL   RBC 4.30 3.87 - 5.11 MIL/uL   Hemoglobin 12.1 12.0 - 15.0 g/dL   HCT 37.6 36.0 - 46.0 %   MCV 87.4 80.0 - 100.0 fL   MCH 28.1  26.0 - 34.0 pg   MCHC 32.2 30.0 - 36.0 g/dL   RDW 14.6 11.5 - 15.5 %   Platelets 257 150 - 400 K/uL   nRBC 0.0 0.0 - 0.2 %   Neutrophils Relative % 90 %   Neutro Abs 21.7 (H) 1.7 - 7.7 K/uL   Lymphocytes Relative 5 %   Lymphs Abs 1.1 0.7 - 4.0 K/uL   Monocytes Relative 4 %   Monocytes Absolute 1.0 0.1 - 1.0 K/uL   Eosinophils Relative 0 %   Eosinophils Absolute 0.0 0.0 - 0.5 K/uL   Basophils Relative 0 %   Basophils Absolute 0.0 0.0 - 0.1 K/uL   Immature Granulocytes 1 %   Abs Immature Granulocytes 0.12 (H) 0.00 - 0.07 K/uL    Comment: Performed at [facility], [place] [address] [place], [place] [address]  Comprehensive metabolic panel     Status: Abnormal   Collection Time: 02/02/21 10:51 AM  Result Value Ref Range   Sodium 137 135 - 145 mmol/L   Potassium 3.6 3.5 - 5.1 mmol/L   Chloride 102 98 - 111 mmol/L   CO2 23 22 - 32 mmol/L   Glucose, Bld 130 (H) 70 - 99 mg/dL    Comment: Glucose reference range applies only to samples taken after fasting for at least 8 hours.   BUN 9 6 - 20 mg/dL   Creatinine, Ser 0.62 0.44 - 1.00 mg/dL   Calcium 9.6 8.9 - 10.3 mg/dL   Total Protein 8.6 (H) 6.5 - 8.1 g/dL   Albumin 4.2 3.5 - 5.0 g/dL   AST 17 15 - 41 U/L   ALT 17 0 - 44 U/L   Alkaline Phosphatase 107 38 - 126 U/L   Total Bilirubin 0.8 0.3 - 1.2 mg/dL   GFR, Estimated >60 >60 mL/min    Comment: (NOTE) Calculated using the CKD-EPI Creatinine Equation (2021)    Anion gap 12 5 - 15    Comment: Performed at [facility], [place] [address] [place], [place] [address]  Lipase, blood     Status: None   Collection Time: 02/02/21 10:51 AM  Result Value Ref Range   Lipase 26 11 - 51 U/L    Comment: Performed at [facility], [place] [address] [place], [place] [address]  I-Stat Beta hCG blood, ED ([facility], [facility], [facility] only)     Status: None   Collection Time: 02/02/21 10:56 AM  Result Value Ref Range   I-stat hCG, quantitative <5.0 <5 mIU/mL    Comment 3            Comment:   GEST. AGE      CONC.  (mIU/mL)   <=1 WEEK        5 - 50     2 WEEKS  50 - 500     3 WEEKS       100 - 10,000     4 WEEKS     1,000 - 30,000        FEMALE AND NON-PREGNANT FEMALE:     LESS THAN 5 mIU/mL   Resp Panel by RT-PCR (Flu A&B, Covid) Nasopharyngeal Swab     Status: None   Collection Time: 02/02/21 11:07 AM   Specimen: Nasopharyngeal Swab; Nasopharyngeal(NP) swabs in vial transport medium  Result Value Ref Range   SARS Coronavirus 2 by RT PCR NEGATIVE NEGATIVE    Comment: (NOTE) SARS-CoV-2 target nucleic acids are NOT DETECTED.  The SARS-CoV-2 RNA is generally detectable in upper respiratory specimens during the acute phase of infection. The lowest concentration of SARS-CoV-2 viral copies this assay can detect is 138 copies/mL. A negative result does not preclude SARS-Cov-2 infection and should not be used as the sole basis for treatment or other patient management decisions. A negative result may occur with  improper specimen collection/handling, submission of specimen other than nasopharyngeal swab, presence of viral mutation(s) within the areas targeted by this assay, and inadequate number of viral copies(<138 copies/mL). A negative result must be combined with clinical observations, patient history, and epidemiological information. The expected result is Negative.  Fact Sheet for Patients:  EntrepreneurPulse.com.au  Fact Sheet for Healthcare Providers:  IncredibleEmployment.be  This test is no t yet approved or cleared by the [place] FDA and  has been authorized for detection and/or diagnosis of SARS-CoV-2 by FDA under an Emergency Use Authorization (EUA). This EUA will remain  in effect (meaning this test can be used) for the duration of the COVID-19 declaration under Section 564(b)(1) of the Act, 21 U.S.C.section 360bbb-3(b)(1), unless the authorization is terminated  or revoked sooner.        Influenza A by PCR NEGATIVE NEGATIVE   Influenza B by PCR NEGATIVE NEGATIVE    Comment: (NOTE) The Xpert Xpress SARS-CoV-2/FLU/RSV plus assay is intended as an aid in the diagnosis of influenza from Nasopharyngeal swab specimens and should not be used as a sole basis for treatment. Nasal washings and aspirates are unacceptable for Xpert Xpress SARS-CoV-2/FLU/RSV testing.  Fact Sheet for Patients: EntrepreneurPulse.com.au  Fact Sheet for Healthcare Providers: IncredibleEmployment.be  This test is not yet approved or cleared by the [place] FDA and has been authorized for detection and/or diagnosis of SARS-CoV-2 by FDA under an Emergency Use Authorization (EUA). This EUA will remain in effect (meaning this test can be used) for the duration of the COVID-19 declaration under Section 564(b)(1) of the Act, 21 U.S.C. section 360bbb-3(b)(1), unless the authorization is terminated or revoked.  Performed at [facility], [place] [address] [place], [place] [address]   Protime-INR     Status: None   Collection Time: 02/02/21 12:09 PM  Result Value Ref Range   Prothrombin Time 13.5 11.4 - 15.2 seconds   INR 1.0 0.8 - 1.2    Comment: (NOTE) INR goal varies based on device and disease states. Performed at [facility], [place] [address], [place], [place] [address]   APTT     Status: None   Collection Time: 02/02/21 12:09 PM  Result Value Ref Range   aPTT 31 24 - 36 seconds    Comment: Performed at [facility], [place] [address] [place], [place] [address]  Lactic acid, plasma     Status: None   Collection Time: 02/02/21 12:41 PM  Result  Value Ref Range   Lactic Acid, Venous 1.4 0.5 - 1.9 mmol/L    Comment: Performed at [facility], [place] [address] [place], [place] [address]    CT ABDOMEN PELVIS W CONTRAST  Result Date: 02/02/2021 CLINICAL DATA:  Abdominal pain.  Surgery for bowel obstruction on 01/23/2021. Vomiting. EXAM: CT ABDOMEN AND PELVIS WITH CONTRAST TECHNIQUE: Multidetector CT imaging of the abdomen and pelvis was performed using the standard protocol following bolus administration of intravenous contrast. CONTRAST:  136mL OMNIPAQUE IOHEXOL 300 MG/ML  SOLN COMPARISON:  01/11/2021 FINDINGS: Lower chest: Unremarkable. Hepatobiliary: No suspicious focal abnormality within the liver parenchyma. Gallbladder is surgically absent. Common bile duct upper normal diameter at 6 mm, similar to prior. Pancreas: No focal mass lesion. No dilatation of the main duct. No intraparenchymal cyst. No peripancreatic edema. Spleen: No splenomegaly. No focal mass lesion. Adrenals/Urinary Tract: No adrenal nodule or mass. Kidneys unremarkable. No evidence for hydroureter. The urinary bladder appears normal for the degree of distention. Stomach/Bowel: Stomach is nondistended. Duodenum is normally positioned as is the ligament of Treitz. No small bowel wall thickening. No small bowel dilatation. The terminal ileum is normal. Appendix is 8 mm diameter, similar to prior without periappendiceal edema or inflammation. No gross colonic mass. No colonic wall thickening. Vascular/Lymphatic: No abdominal aortic aneurysm. No abdominal lymphadenopathy. No pelvic sidewall lymphadenopathy. Reproductive: Uterus surgically absent.  There is no adnexal mass. Other: There is some trace free fluid in the anterior left pelvis (66/2). Multiple small bowel adhesions noted left anterior abdomen. Musculoskeletal: A collection of gas and edema is identified in the left paraumbilical anterior abdominal wall, measuring approximately 8.7 x 5.9 x 8.6 cm. This is unorganized without a rim or significant fluid component. Gas does appear to track deep into the left rectus sheath (axial 49/2) with tiny gas bubbles that appear to be in the preperitoneal space near the midline (48/2 and 47/2). IMPRESSION: 1. Ill-defined  collection of gas and edema is identified in the subcutaneous fat of the left paraumbilical anterior abdominal wall. This is unorganized without a rim or significant fluid component. Gas tracks deep into the left rectus sheath with tiny gas bubbles that appear to be in the preperitoneal space near the midline. Small bowel loops in this region appear adhesed anteriorly. Imaging features may be related to cellulitis. Evolving enterocutaneous is not excluded. 2. No evidence for small bowel obstruction with trace free fluid in the anterior left pelvis. 3. Status post cholecystectomy and hysterectomy. Electronically Signed   By: Misty Stanley M.D.   On: 02/02/2021 12:07    ROS - all of the below systems have been reviewed with the patient and positives are indicated with bold text General: chills, fever or night sweats Eyes: blurry vision or double vision ENT: epistaxis or sore throat Allergy/Immunology: itchy/watery eyes or nasal congestion Hematologic/Lymphatic: bleeding problems, blood clots or swollen lymph nodes Endocrine: temperature intolerance or unexpected weight changes Breast: new or changing breast lumps or nipple discharge Resp: cough, shortness of breath, or wheezing CV: chest pain or dyspnea on exertion GI: as per HPI GU: dysuria, trouble voiding, or hematuria MSK: joint pain or joint stiffness Neuro: TIA or stroke symptoms Derm: pruritus and skin lesion changes Psych: anxiety and depression  PE Blood pressure (!) 148/107, pulse (!) 114, temperature 98.7 F (37.1 C), temperature source Oral, resp. rate (!) 23, height 5\' 2"  (1.575 m), weight 90.3 kg, last menstrual period 07/05/2019, SpO2 98 %. Constitutional: NAD; conversant; no deformities Eyes: Moist conjunctiva;  no lid lag; anicteric; PERRL Neck: Trachea midline; no thyromegaly Lungs: Normal respiratory effort; no tactile fremitus CV: RRR; no palpable thrills; no pitting edema GI: Abd soft, tenderness around incision which is  warm, there is no active drainage; nondistended MSK: Normal range of motion of extremities; no clubbing/cyanosis Psychiatric: Appropriate affect; alert and oriented x3 Lymphatic: No palpable cervical or axillary lymphadenopathy  Results for orders placed or performed during the hospital encounter of 02/02/21 (from the past 48 hour(s))  CBC with Differential/Platelet     Status: Abnormal   Collection Time: 02/02/21 10:51 AM  Result Value Ref Range   WBC 24.0 (H) 4.0 - 10.5 K/uL   RBC 4.30 3.87 - 5.11 MIL/uL   Hemoglobin 12.1 12.0 - 15.0 g/dL   HCT 37.6 36.0 - 46.0 %   MCV 87.4 80.0 - 100.0 fL   MCH 28.1 26.0 - 34.0 pg   MCHC 32.2 30.0 - 36.0 g/dL   RDW 14.6 11.5 - 15.5 %   Platelets 257 150 - 400 K/uL   nRBC 0.0 0.0 - 0.2 %   Neutrophils Relative % 90 %   Neutro Abs 21.7 (H) 1.7 - 7.7 K/uL   Lymphocytes Relative 5 %   Lymphs Abs 1.1 0.7 - 4.0 K/uL   Monocytes Relative 4 %   Monocytes Absolute 1.0 0.1 - 1.0 K/uL   Eosinophils Relative 0 %   Eosinophils Absolute 0.0 0.0 - 0.5 K/uL   Basophils Relative 0 %   Basophils Absolute 0.0 0.0 - 0.1 K/uL   Immature Granulocytes 1 %   Abs Immature Granulocytes 0.12 (H) 0.00 - 0.07 K/uL    Comment: Performed at [facility], [place] [address] [place], [place] [address]  Comprehensive metabolic panel     Status: Abnormal   Collection Time: 02/02/21 10:51 AM  Result Value Ref Range   Sodium 137 135 - 145 mmol/L   Potassium 3.6 3.5 - 5.1 mmol/L   Chloride 102 98 - 111 mmol/L   CO2 23 22 - 32 mmol/L   Glucose, Bld 130 (H) 70 - 99 mg/dL    Comment: Glucose reference range applies only to samples taken after fasting for at least 8 hours.   BUN 9 6 - 20 mg/dL   Creatinine, Ser 0.62 0.44 - 1.00 mg/dL   Calcium 9.6 8.9 - 10.3 mg/dL   Total Protein 8.6 (H) 6.5 - 8.1 g/dL   Albumin 4.2 3.5 - 5.0 g/dL   AST 17 15 - 41 U/L   ALT 17 0 - 44 U/L   Alkaline Phosphatase 107 38 - 126 U/L   Total Bilirubin 0.8 0.3 - 1.2 mg/dL   GFR,  Estimated >60 >60 mL/min    Comment: (NOTE) Calculated using the CKD-EPI Creatinine Equation (2021)    Anion gap 12 5 - 15    Comment: Performed at [facility], [place] [address] [place], [place] [address]  Lipase, blood     Status: None   Collection Time: 02/02/21 10:51 AM  Result Value Ref Range   Lipase 26 11 - 51 U/L    Comment: Performed at [facility], [place] [address] [place], [place] [address]  I-Stat Beta hCG blood, ED ([facility], [facility], [facility] only)     Status: None   Collection Time: 02/02/21 10:56 AM  Result Value Ref Range   I-stat hCG, quantitative <5.0 <5 mIU/mL   Comment 3            Comment:  GEST. AGE      CONC.  (mIU/mL)   <=1 WEEK        5 - 50     2 WEEKS       50 - 500     3 WEEKS       100 - 10,000     4 WEEKS     1,000 - 30,000        FEMALE AND NON-PREGNANT FEMALE:     LESS THAN 5 mIU/mL   Resp Panel by RT-PCR (Flu A&B, Covid) Nasopharyngeal Swab     Status: None   Collection Time: 02/02/21 11:07 AM   Specimen: Nasopharyngeal Swab; Nasopharyngeal(NP) swabs in vial transport medium  Result Value Ref Range   SARS Coronavirus 2 by RT PCR NEGATIVE NEGATIVE    Comment: (NOTE) SARS-CoV-2 target nucleic acids are NOT DETECTED.  The SARS-CoV-2 RNA is generally detectable in upper respiratory specimens during the acute phase of infection. The lowest concentration of SARS-CoV-2 viral copies this assay can detect is 138 copies/mL. A negative result does not preclude SARS-Cov-2 infection and should not be used as the sole basis for treatment or other patient management decisions. A negative result may occur with  improper specimen collection/handling, submission of specimen other than nasopharyngeal swab, presence of viral mutation(s) within the areas targeted by this assay, and inadequate number of viral copies(<138 copies/mL). A negative result must be combined with clinical observations, patient history, and  epidemiological information. The expected result is Negative.  Fact Sheet for Patients:  EntrepreneurPulse.com.au  Fact Sheet for Healthcare Providers:  IncredibleEmployment.be  This test is no t yet approved or cleared by the [place] FDA and  has been authorized for detection and/or diagnosis of SARS-CoV-2 by FDA under an Emergency Use Authorization (EUA). This EUA will remain  in effect (meaning this test can be used) for the duration of the COVID-19 declaration under Section 564(b)(1) of the Act, 21 U.S.C.section 360bbb-3(b)(1), unless the authorization is terminated  or revoked sooner.       Influenza A by PCR NEGATIVE NEGATIVE   Influenza B by PCR NEGATIVE NEGATIVE    Comment: (NOTE) The Xpert Xpress SARS-CoV-2/FLU/RSV plus assay is intended as an aid in the diagnosis of influenza from Nasopharyngeal swab specimens and should not be used as a sole basis for treatment. Nasal washings and aspirates are unacceptable for Xpert Xpress SARS-CoV-2/FLU/RSV testing.  Fact Sheet for Patients: EntrepreneurPulse.com.au  Fact Sheet for Healthcare Providers: IncredibleEmployment.be  This test is not yet approved or cleared by the [place] FDA and has been authorized for detection and/or diagnosis of SARS-CoV-2 by FDA under an Emergency Use Authorization (EUA). This EUA will remain in effect (meaning this test can be used) for the duration of the COVID-19 declaration under Section 564(b)(1) of the Act, 21 U.S.C. section 360bbb-3(b)(1), unless the authorization is terminated or revoked.  Performed at [facility], [place] [address] [place], [place] [address]   Protime-INR     Status: None   Collection Time: 02/02/21 12:09 PM  Result Value Ref Range   Prothrombin Time 13.5 11.4 - 15.2 seconds   INR 1.0 0.8 - 1.2    Comment: (NOTE) INR goal varies based on device and disease  states. Performed at [facility], [place] [address] [place], [place] [address]   APTT     Status: None   Collection Time: 02/02/21 12:09 PM  Result Value Ref Range   aPTT 31 24 - 36  seconds    Comment: Performed at [facility], [place] [address] [place], [place] [address]  Lactic acid, plasma     Status: None   Collection Time: 02/02/21 12:41 PM  Result Value Ref Range   Lactic Acid, Venous 1.4 0.5 - 1.9 mmol/L    Comment: Performed at [facility], [place] [address], [place], [place] [address]    CT ABDOMEN PELVIS W CONTRAST  Result Date: 02/02/2021 CLINICAL DATA:  Abdominal pain. Surgery for bowel obstruction on 01/23/2021. Vomiting. EXAM: CT ABDOMEN AND PELVIS WITH CONTRAST TECHNIQUE: Multidetector CT imaging of the abdomen and pelvis was performed using the standard protocol following bolus administration of intravenous contrast. CONTRAST:  132mL OMNIPAQUE IOHEXOL 300 MG/ML  SOLN COMPARISON:  01/11/2021 FINDINGS: Lower chest: Unremarkable. Hepatobiliary: No suspicious focal abnormality within the liver parenchyma. Gallbladder is surgically absent. Common bile duct upper normal diameter at 6 mm, similar to prior. Pancreas: No focal mass lesion. No dilatation of the main duct. No intraparenchymal cyst. No peripancreatic edema. Spleen: No splenomegaly. No focal mass lesion. Adrenals/Urinary Tract: No adrenal nodule or mass. Kidneys unremarkable. No evidence for hydroureter. The urinary bladder appears normal for the degree of distention. Stomach/Bowel: Stomach is nondistended. Duodenum is normally positioned as is the ligament of Treitz. No small bowel wall thickening. No small bowel dilatation. The terminal ileum is normal. Appendix is 8 mm diameter, similar to prior without periappendiceal edema or inflammation. No gross colonic mass. No colonic wall thickening. Vascular/Lymphatic: No abdominal aortic aneurysm. No abdominal lymphadenopathy. No  pelvic sidewall lymphadenopathy. Reproductive: Uterus surgically absent.  There is no adnexal mass. Other: There is some trace free fluid in the anterior left pelvis (66/2). Multiple small bowel adhesions noted left anterior abdomen. Musculoskeletal: A collection of gas and edema is identified in the left paraumbilical anterior abdominal wall, measuring approximately 8.7 x 5.9 x 8.6 cm. This is unorganized without a rim or significant fluid component. Gas does appear to track deep into the left rectus sheath (axial 49/2) with tiny gas bubbles that appear to be in the preperitoneal space near the midline (48/2 and 47/2). IMPRESSION: 1. Ill-defined collection of gas and edema is identified in the subcutaneous fat of the left paraumbilical anterior abdominal wall. This is unorganized without a rim or significant fluid component. Gas tracks deep into the left rectus sheath with tiny gas bubbles that appear to be in the preperitoneal space near the midline. Small bowel loops in this region appear adhesed anteriorly. Imaging features may be related to cellulitis. Evolving enterocutaneous is not excluded. 2. No evidence for small bowel obstruction with trace free fluid in the anterior left pelvis. 3. Status post cholecystectomy and hysterectomy. Electronically Signed   By: Misty Stanley M.D.   On: 02/02/2021 12:07     A/P: Jiali Linney is an 45 y.o. female with hx advanced stage ovarian CA with prior debulking now, recent recurrence but no evident disease on scan at this point and normal tumor markers. She has a now known hostile abdomen at attempted exploration 01/25/21 with enterotomy that was repaired. CT which I have reviewed shows large subQ collection with gas and edema just to left of umbilicus; this is concerning in appearance and with her history for evolving enterocutaneous fistula  -She is going to OR urgently with Dr. Berline Lopes and I will help with the procedure. I have additionally discussed with the  patient the variety of things that might be encountered and plans therein. If enterocutaneous fistula is  found, potentially pouching with appliance vs packing until we have the expertise of our WOCN team to assist with [place] pouch application. If there is no evidence of a fistula, washout and wound packing. The patient demonstrates good understanding and has agreed to proceed with the procedure.  Nadeen Landau, MD [facility]

## 2021-02-02 NOTE — ED Notes (Addendum)
Pt given pre-op wipes and verbalized understanding of process.    Personal belongings will be send with husband.

## 2021-02-03 ENCOUNTER — Encounter (HOSPITAL_COMMUNITY): Payer: Self-pay | Admitting: Obstetrics & Gynecology

## 2021-02-03 ENCOUNTER — Observation Stay (HOSPITAL_COMMUNITY): Payer: Medicaid Other

## 2021-02-03 DIAGNOSIS — Z8041 Family history of malignant neoplasm of ovary: Secondary | ICD-10-CM | POA: Diagnosis not present

## 2021-02-03 DIAGNOSIS — Z79899 Other long term (current) drug therapy: Secondary | ICD-10-CM | POA: Diagnosis not present

## 2021-02-03 DIAGNOSIS — Z9079 Acquired absence of other genital organ(s): Secondary | ICD-10-CM | POA: Diagnosis not present

## 2021-02-03 DIAGNOSIS — R6510 Systemic inflammatory response syndrome (SIRS) of non-infectious origin without acute organ dysfunction: Secondary | ICD-10-CM | POA: Diagnosis present

## 2021-02-03 DIAGNOSIS — Z803 Family history of malignant neoplasm of breast: Secondary | ICD-10-CM | POA: Diagnosis not present

## 2021-02-03 DIAGNOSIS — Z7984 Long term (current) use of oral hypoglycemic drugs: Secondary | ICD-10-CM | POA: Diagnosis not present

## 2021-02-03 DIAGNOSIS — T8141XA Infection following a procedure, superficial incisional surgical site, initial encounter: Secondary | ICD-10-CM | POA: Diagnosis present

## 2021-02-03 DIAGNOSIS — G8929 Other chronic pain: Secondary | ICD-10-CM | POA: Diagnosis present

## 2021-02-03 DIAGNOSIS — R000 Tachycardia, unspecified: Secondary | ICD-10-CM | POA: Diagnosis present

## 2021-02-03 DIAGNOSIS — K5651 Intestinal adhesions [bands], with partial obstruction: Secondary | ICD-10-CM | POA: Diagnosis present

## 2021-02-03 DIAGNOSIS — T451X5A Adverse effect of antineoplastic and immunosuppressive drugs, initial encounter: Secondary | ICD-10-CM | POA: Diagnosis present

## 2021-02-03 DIAGNOSIS — E669 Obesity, unspecified: Secondary | ICD-10-CM | POA: Diagnosis present

## 2021-02-03 DIAGNOSIS — Z20822 Contact with and (suspected) exposure to covid-19: Secondary | ICD-10-CM | POA: Diagnosis present

## 2021-02-03 DIAGNOSIS — Z6835 Body mass index (BMI) 35.0-35.9, adult: Secondary | ICD-10-CM | POA: Diagnosis not present

## 2021-02-03 DIAGNOSIS — T8149XA Infection following a procedure, other surgical site, initial encounter: Secondary | ICD-10-CM | POA: Diagnosis present

## 2021-02-03 DIAGNOSIS — L02211 Cutaneous abscess of abdominal wall: Secondary | ICD-10-CM | POA: Diagnosis present

## 2021-02-03 DIAGNOSIS — E099 Drug or chemical induced diabetes mellitus without complications: Secondary | ICD-10-CM | POA: Diagnosis present

## 2021-02-03 DIAGNOSIS — Z800 Family history of malignant neoplasm of digestive organs: Secondary | ICD-10-CM | POA: Diagnosis not present

## 2021-02-03 DIAGNOSIS — T82848A Pain from vascular prosthetic devices, implants and grafts, initial encounter: Secondary | ICD-10-CM | POA: Diagnosis not present

## 2021-02-03 DIAGNOSIS — Z8616 Personal history of COVID-19: Secondary | ICD-10-CM | POA: Diagnosis not present

## 2021-02-03 DIAGNOSIS — Y848 Other medical procedures as the cause of abnormal reaction of the patient, or of later complication, without mention of misadventure at the time of the procedure: Secondary | ICD-10-CM | POA: Diagnosis not present

## 2021-02-03 DIAGNOSIS — J45909 Unspecified asthma, uncomplicated: Secondary | ICD-10-CM | POA: Diagnosis present

## 2021-02-03 DIAGNOSIS — K632 Fistula of intestine: Secondary | ICD-10-CM | POA: Diagnosis present

## 2021-02-03 DIAGNOSIS — Z90710 Acquired absence of both cervix and uterus: Secondary | ICD-10-CM | POA: Diagnosis not present

## 2021-02-03 DIAGNOSIS — Z8042 Family history of malignant neoplasm of prostate: Secondary | ICD-10-CM | POA: Diagnosis not present

## 2021-02-03 DIAGNOSIS — D6481 Anemia due to antineoplastic chemotherapy: Secondary | ICD-10-CM | POA: Diagnosis present

## 2021-02-03 LAB — CBC
HCT: 33.1 % — ABNORMAL LOW (ref 36.0–46.0)
Hemoglobin: 10.4 g/dL — ABNORMAL LOW (ref 12.0–15.0)
MCH: 28.5 pg (ref 26.0–34.0)
MCHC: 31.4 g/dL (ref 30.0–36.0)
MCV: 90.7 fL (ref 80.0–100.0)
Platelets: 219 10*3/uL (ref 150–400)
RBC: 3.65 MIL/uL — ABNORMAL LOW (ref 3.87–5.11)
RDW: 14.4 % (ref 11.5–15.5)
WBC: 24.5 10*3/uL — ABNORMAL HIGH (ref 4.0–10.5)
nRBC: 0 % (ref 0.0–0.2)

## 2021-02-03 LAB — BASIC METABOLIC PANEL
Anion gap: 9 (ref 5–15)
BUN: 8 mg/dL (ref 6–20)
CO2: 24 mmol/L (ref 22–32)
Calcium: 9.1 mg/dL (ref 8.9–10.3)
Chloride: 102 mmol/L (ref 98–111)
Creatinine, Ser: 0.59 mg/dL (ref 0.44–1.00)
GFR, Estimated: >60 mL/min (ref >60)
Glucose, Bld: 215 mg/dL — ABNORMAL HIGH (ref 70–99)
Potassium: 4.1 mmol/L (ref 3.5–5.1)
Sodium: 135 mmol/L (ref 135–145)

## 2021-02-03 MED ORDER — IOHEXOL 9 MG/ML PO SOLN
500.0000 mL | ORAL | Status: AC
  Administered 2021-02-03 (×2): 500 mL via ORAL

## 2021-02-03 MED ORDER — IOHEXOL 300 MG/ML  SOLN
100.0000 mL | Freq: Once | INTRAMUSCULAR | Status: AC | PRN
  Administered 2021-02-03: 100 mL via INTRAVENOUS

## 2021-02-03 MED ORDER — SODIUM CHLORIDE (PF) 0.9 % IJ SOLN
INTRAMUSCULAR | Status: AC
  Filled 2021-02-03: qty 50

## 2021-02-03 MED ORDER — PHENYLEPHRINE HCL (PRESSORS) 10 MG/ML IV SOLN
INTRAVENOUS | Status: AC
  Filled 2021-02-03: qty 1

## 2021-02-03 MED ORDER — ACETAMINOPHEN 650 MG RE SUPP
650.0000 mg | Freq: Four times a day (QID) | RECTAL | Status: DC | PRN
  Administered 2021-02-05 – 2021-02-09 (×3): 650 mg via RECTAL
  Filled 2021-02-03 (×4): qty 1

## 2021-02-03 MED ORDER — CHLORHEXIDINE GLUCONATE CLOTH 2 % EX PADS
6.0000 | MEDICATED_PAD | Freq: Every day | CUTANEOUS | Status: DC
  Administered 2021-02-03 – 2021-02-09 (×7): 6 via TOPICAL

## 2021-02-03 NOTE — Anesthesia Postprocedure Evaluation (Signed)
Anesthesia Post Note  Patient: Rasheeda Mulvehill  Procedure(s) Performed: INCISION AND DRAINAGE ABSCESS (Abdomen)     Patient location during evaluation: PACU Anesthesia Type: General Level of consciousness: awake and alert Pain management: pain level controlled Vital Signs Assessment: post-procedure vital signs reviewed and stable Respiratory status: spontaneous breathing, nonlabored ventilation, respiratory function stable and patient connected to nasal cannula oxygen Cardiovascular status: blood pressure returned to baseline and stable Postop Assessment: no apparent nausea or vomiting Anesthetic complications: no   No notable events documented.  Last Vitals:  Vitals:   02/02/21 1851 02/02/21 2309  BP: 104/76 111/82  Pulse: (!) 105 79  Resp: 16 14  Temp: 36.9 C (!) 36.4 C  SpO2: 95% 100%    Last Pain:  Vitals:   02/02/21 2309  TempSrc: Oral  PainSc:                  Effie Berkshire

## 2021-02-03 NOTE — Progress Notes (Signed)
4x4 gauze and abd pad changed.

## 2021-02-03 NOTE — Progress Notes (Signed)
Patient called RN to room due to bleeding through her abdominal binder. Upon assessment drainage was brown colored. Replaced gauze, ABD pad, and put abdominal binder back in place.

## 2021-02-03 NOTE — Progress Notes (Signed)
Replaced gauze and ABD pad again, drainage yellow this time.

## 2021-02-03 NOTE — Progress Notes (Signed)
1 Day Post-Op Procedure(s) (LRB): INCISION AND DRAINAGE ABSCESS (N/A)  Subjective: Patient reports less pain.  No N/V.  Objective: Vital signs in last 24 hours: Temp:  [97.5 F (36.4 C)-99 F (37.2 C)] 97.5 F (36.4 C) (07/03 0645) Pulse Rate:  [62-114] 62 (07/03 0645) Resp:  [14-27] 17 (07/03 0645) BP: (104-148)/(75-112) 120/81 (07/03 0645) SpO2:  [95 %-100 %] 100 % (07/03 0645) Last BM Date: 01/31/21  Intake/Output from previous day: 07/02 0701 - 07/03 0700 In: 3704.4 [I.V.:2654.4; IV Piggyback:1050] Out: 1610 [Urine:1325; Blood:10]  Physical Examination: General: alert and no distress Extremities: Homans sign is negative, no sign of DVT Abd: less induration; green, watery drainage on dressing/packing.  Dressing/packing removed/replaced.  Labs:  Results for orders placed or performed during the hospital encounter of 02/02/21 (from the past 24 hour(s))  Protime-INR     Status: None   Collection Time: 02/02/21 12:09 PM  Result Value Ref Range   Prothrombin Time 13.5 11.4 - 15.2 seconds   INR 1.0 0.8 - 1.2  APTT     Status: None   Collection Time: 02/02/21 12:09 PM  Result Value Ref Range   aPTT 31 24 - 36 seconds  Lactic acid, plasma     Status: None   Collection Time: 02/02/21 12:41 PM  Result Value Ref Range   Lactic Acid, Venous 1.4 0.5 - 1.9 mmol/L  Blood culture (routine single)     Status: None (Preliminary result)   Collection Time: 02/02/21 12:46 PM   Specimen: BLOOD  Result Value Ref Range   Specimen Description      BLOOD RIGHT ANTECUBITAL Performed at [facility], [place] [address] [place], [place] [address]    Special Requests      BOTTLES DRAWN AEROBIC AND ANAEROBIC Blood Culture adequate volume Performed at [facility] [address] [place], [place] [address]    Culture      NO GROWTH < 24 HOURS Performed at [facility] [address], [place], [place] [address]    Report Status PENDING    Culture, blood (single)     Status: None (Preliminary result)   Collection Time: 02/02/21  1:50 PM   Specimen: BLOOD LEFT HAND  Result Value Ref Range   Specimen Description      BLOOD LEFT HAND Performed at [facility] [address], [place], [place] [address]    Special Requests      BOTTLES DRAWN AEROBIC AND ANAEROBIC Blood Culture adequate volume Performed at [facility] [address], [place], [place] [address]    Culture      NO GROWTH < 24 HOURS Performed at [facility] [address], [place], [place] [address]    Report Status PENDING   Aerobic/Anaerobic Culture w Gram Stain (surgical/deep wound)     Status: None (Preliminary result)   Collection Time: 02/02/21  5:12 PM   Specimen: Wound  Result Value Ref Range   Specimen Description      WOUND ABDOMEN Performed at [facility] [address], [place], [place] [address]    Special Requests      NONE Performed at [facility], [place] [address] [place], [place] [address]    Gram Stain      NO WBC SEEN ABUNDANT GRAM NEGATIVE RODS RARE GRAM POSITIVE COCCI IN PAIRS Performed at [facility], [place] [address], [place], [place] [address]    Culture  PENDING    Report Status PENDING   CBC     Status: Abnormal   Collection Time: 02/03/21  6:28 AM  Result Value Ref Range   WBC 24.5 (H) 4.0 - 10.5 K/uL   RBC 3.65 (L) 3.87 - 5.11 MIL/uL   Hemoglobin 10.4 (L) 12.0 - 15.0 g/dL   HCT 33.1 (L) 36.0 - 46.0 %   MCV 90.7 80.0 - 100.0 fL   MCH 28.5 26.0 - 34.0 pg   MCHC 31.4 30.0 - 36.0 g/dL   RDW 14.4 11.5 - 15.5 %   Platelets 219 150 - 400 K/uL   nRBC 0.0 0.0 - 0.2 %  Basic metabolic panel     Status: Abnormal   Collection Time: 02/03/21  6:28 AM  Result Value Ref Range   Sodium 135 135 - 145 mmol/L   Potassium 4.1 3.5 - 5.1 mmol/L   Chloride 102 98 - 111 mmol/L   CO2 24 22 - 32 mmol/L   Glucose, Bld 215 (H) 70 - 99 mg/dL   BUN 8 6 - 20  mg/dL   Creatinine, Ser 0.59 0.44 - 1.00 mg/dL   Calcium 9.1 8.9 - 10.3 mg/dL   GFR, Estimated >60 >60 mL/min   Anion gap 9 5 - 15     Assessment:  45 y.o. s/p Procedure(s): INCISION AND DRAINAGE ABSCESS: stable and exam worrisome for an enterocutaneous fistula Pain:  Pain is well-controlled on or oral medications.  Heme:Anemia: mild, stable  ID: Abscess of Wound. Mild SIRS resolving following I&D, fluid resuscitation.  Persistent leukocytosis  GI:  See above.  Tolerating po: Yes    FEN: Adequate u.o. Electrolytes in range   Prophylaxis: intermittent pneumatic compression boots.  Plan: NPO for now Review the CT findings to further elucidate the anatomy of the fistula Consult w/stoma/wound team for pouch/hardware, wound care Continue broad spectrum abx Measure I & O, daily weights Serial lytes Encourage ambulation  LOS: 0 days    Lahoma Crocker, MD 02/03/2021, 11:01 AM

## 2021-02-04 LAB — BASIC METABOLIC PANEL
Anion gap: 8 (ref 5–15)
BUN: 6 mg/dL (ref 6–20)
CO2: 28 mmol/L (ref 22–32)
Calcium: 8.9 mg/dL (ref 8.9–10.3)
Chloride: 102 mmol/L (ref 98–111)
Creatinine, Ser: 0.66 mg/dL (ref 0.44–1.00)
GFR, Estimated: >60 mL/min (ref >60)
Glucose, Bld: 121 mg/dL — ABNORMAL HIGH (ref 70–99)
Potassium: 3.4 mmol/L — ABNORMAL LOW (ref 3.5–5.1)
Sodium: 138 mmol/L (ref 135–145)

## 2021-02-04 LAB — CBC
HCT: 32 % — ABNORMAL LOW (ref 36.0–46.0)
Hemoglobin: 10.1 g/dL — ABNORMAL LOW (ref 12.0–15.0)
MCH: 28.2 pg (ref 26.0–34.0)
MCHC: 31.6 g/dL (ref 30.0–36.0)
MCV: 89.4 fL (ref 80.0–100.0)
Platelets: 239 10*3/uL (ref 150–400)
RBC: 3.58 MIL/uL — ABNORMAL LOW (ref 3.87–5.11)
RDW: 14.1 % (ref 11.5–15.5)
WBC: 16.1 10*3/uL — ABNORMAL HIGH (ref 4.0–10.5)
nRBC: 0 % (ref 0.0–0.2)

## 2021-02-04 LAB — MAGNESIUM: Magnesium: 1.7 mg/dL (ref 1.7–2.4)

## 2021-02-04 LAB — PHOSPHORUS: Phosphorus: 2.4 mg/dL — ABNORMAL LOW (ref 2.5–4.6)

## 2021-02-04 LAB — GLUCOSE, CAPILLARY: Glucose-Capillary: 83 mg/dL (ref 70–99)

## 2021-02-04 MED ORDER — LORAZEPAM 2 MG/ML IJ SOLN
0.5000 mg | Freq: Once | INTRAMUSCULAR | Status: AC
  Administered 2021-02-04: 0.5 mg via INTRAVENOUS

## 2021-02-04 MED ORDER — KETOROLAC TROMETHAMINE 15 MG/ML IJ SOLN
15.0000 mg | Freq: Four times a day (QID) | INTRAMUSCULAR | Status: AC | PRN
Start: 2021-02-04 — End: 2021-02-08
  Administered 2021-02-05 – 2021-02-07 (×4): 15 mg via INTRAVENOUS
  Filled 2021-02-04 (×4): qty 1

## 2021-02-04 MED ORDER — LIDOCAINE-PRILOCAINE 2.5-2.5 % EX CREA
TOPICAL_CREAM | Freq: Once | CUTANEOUS | Status: AC
  Filled 2021-02-04: qty 5

## 2021-02-04 MED ORDER — CHLORHEXIDINE GLUCONATE CLOTH 2 % EX PADS: 6.0000 | MEDICATED_PAD | Freq: Every day | CUTANEOUS | Status: DC

## 2021-02-04 MED ORDER — LORAZEPAM 2 MG/ML IJ SOLN
INTRAMUSCULAR | Status: AC
  Filled 2021-02-04: qty 1

## 2021-02-04 MED ORDER — POTASSIUM PHOSPHATES 15 MMOLE/5ML IV SOLN
20.0000 mmol | Freq: Once | INTRAVENOUS | Status: AC
  Administered 2021-02-04: 20 mmol via INTRAVENOUS
  Filled 2021-02-04: qty 6.67

## 2021-02-04 MED ORDER — KETOROLAC TROMETHAMINE 15 MG/ML IJ SOLN
15.0000 mg | Freq: Once | INTRAMUSCULAR | Status: AC
  Administered 2021-02-04: 15 mg via INTRAVENOUS
  Filled 2021-02-04: qty 1

## 2021-02-04 MED ORDER — LORAZEPAM 2 MG/ML IJ SOLN
0.5000 mg | Freq: Four times a day (QID) | INTRAMUSCULAR | Status: DC | PRN
  Administered 2021-02-04 – 2021-02-08 (×7): 0.5 mg via INTRAVENOUS
  Filled 2021-02-04 (×7): qty 1

## 2021-02-04 MED ORDER — KCL IN DEXTROSE-NACL 20-5-0.45 MEQ/L-%-% IV SOLN
INTRAVENOUS | Status: AC
  Filled 2021-02-04 (×2): qty 1000

## 2021-02-04 MED ORDER — TRAVASOL 10 % IV SOLN
INTRAVENOUS | Status: AC
  Filled 2021-02-04: qty 480

## 2021-02-04 MED ORDER — HYDROMORPHONE HCL 1 MG/ML IJ SOLN
0.5000 mg | INTRAMUSCULAR | Status: DC | PRN
  Administered 2021-02-04 – 2021-02-05 (×8): 0.5 mg via INTRAVENOUS
  Filled 2021-02-04 (×8): qty 0.5

## 2021-02-04 MED ORDER — INSULIN ASPART 100 UNIT/ML IJ SOLN
0.0000 [IU] | Freq: Four times a day (QID) | INTRAMUSCULAR | Status: DC
  Administered 2021-02-05 – 2021-02-06 (×6): 1 [IU] via SUBCUTANEOUS

## 2021-02-04 NOTE — Progress Notes (Signed)
PHARMACY - TOTAL PARENTERAL NUTRITION CONSULT NOTE   Indication: Fistula  Patient Measurements: Height: 5\' 2"  (157.5 cm) Weight: 90.2 kg (198 lb 13.7 oz) IBW/kg (Calculated) : 50.1 TPN AdjBW (KG): 60.1 Body mass index is 36.37 kg/m. Adj Body Weight 66kg  Assessment: enterocutaneous fistula, hx of Ovarian Ca  Glucose / Insulin: hx DM? (previous Metformin, not taking at admission) Electrolytes:  Renal:  Hepatic:  Intake / Output; MIVF:  GI Imaging: GI Surgeries / Procedures: 6/22 Lap - enterotomy repair 7/2 I&D of abd abscess  Central access: has PAC TPN start date:   Nutritional Goals (per RD recommendation on ): kCal: , Protein: , Fluid:  Goal (prior to RD consult) TPN rate is 80 mL/hr (provides 96 g of protein and 1750 kcals per day) based on ABW 66kg  Current Nutrition:  NPO  Plan:  Potassium Phosphate 20 mMol bolus x1  At 1800: Start TPN at 27mL/hr  Electrolytes in TPN: Na 42mEq/L, K 8mEq/L, Ca 56mEq/L, Mg 76mEq/L, and Phos 40mmol/L. Cl:Ac 1:1 Add standard MVI and trace elements to TPN Initiate Sensitive q6h SSI and adjust as needed  Reduce MIVF to 110 mL/hr at 1800 Monitor TPN labs on Mon/Thurs Full labs in am 7/5  Minda Ditto PharmD 02/04/2021,10:51 AM

## 2021-02-04 NOTE — Progress Notes (Signed)
Subjective No acute events. Ongoing drainage of succus from her incision/drainage site. CT showed enterocutaneous fistula.  Objective: Vital signs in last 24 hours: Temp:  [97.4 F (36.3 C)-98.6 F (37 C)] 98.6 F (37 C) (07/04 0444) Pulse Rate:  [50-76] 76 (07/04 0444) Resp:  [14-16] 14 (07/04 0444) BP: (100-117)/(79-91) 100/79 (07/04 0444) SpO2:  [99 %-100 %] 100 % (07/04 0444) Weight:  [90.2 kg] 90.2 kg (07/04 0444) Last BM Date: 01/31/21  Intake/Output from previous day: 07/03 0701 - 07/04 0700 In: 0  Out: 1800 [Urine:1800] Intake/Output this shift: No intake/output data recorded.  Gen: NAD, comfortable CV: RRR Pulm: Normal work of breathing Abd: Soft, fistula dressed currently with gauze. Ext: SCDs in place  Lab Results: CBC  Recent Labs    02/02/21 1051 02/03/21 0628  WBC 24.0* 24.5*  HGB 12.1 10.4*  HCT 37.6 33.1*  PLT 257 219   BMET Recent Labs    02/02/21 1051 02/03/21 0628  NA 137 135  K 3.6 4.1  CL 102 102  CO2 23 24  GLUCOSE 130* 215*  BUN 9 8  CREATININE 0.62 0.59  CALCIUM 9.6 9.1   PT/INR Recent Labs    02/02/21 1209  LABPROT 13.5  INR 1.0   ABG No results for input(s): PHART, HCO3 in the last 72 hours.  Invalid input(s): PCO2, PO2  Studies/Results:  Anti-infectives: Anti-infectives (From admission, onward)    Start     Dose/Rate Route Frequency Ordered Stop   02/02/21 2200  piperacillin-tazobactam (ZOSYN) IVPB 3.375 g        3.375 g 12.5 mL/hr over 240 Minutes Intravenous Every 8 hours 02/02/21 1758     02/02/21 1300  ceFEPIme (MAXIPIME) 2 g in sodium chloride 0.9 % 100 mL IVPB        2 g 200 mL/hr over 30 Minutes Intravenous  Once 02/02/21 1256 02/02/21 1518   02/02/21 1300  metroNIDAZOLE (FLAGYL) IVPB 500 mg        500 mg 100 mL/hr over 60 Minutes Intravenous  Once 02/02/21 1256 02/02/21 1518        Assessment/Plan: Patient Active Problem List   Diagnosis Date Noted   Postoperative abscess 02/02/2021   Abscess     Intermittent small bowel obstruction due to adhesions ([place]) 01/23/2021   Intra-abdominal adhesions    Nausea with vomiting 01/11/2021   SBO (small bowel obstruction) ([place]) 01/11/2021   Poor dentition 12/10/2020   Pancytopenia, acquired ([place]) 12/10/2020   Allergic reaction 10/15/2020   Physical debility 10/01/2020   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   Constipation 09/20/2020   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   Cancer associated pain 09/14/2020   Lab test positive for detection of COVID-19 virus 09/10/2020   Hypokalemia 08/30/2020   Obesity, Class II, BMI 35-39.9 07/17/2020   Anemia, chronic disease 05/29/2020   UTI (urinary tract infection) 01/09/2020   Dysuria 01/06/2020   Anemia due to antineoplastic chemotherapy 12/05/2019   Gastritis 12/05/2019   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/2019   Weight gain 10/25/2019   Vaginal dryness 10/24/2019   Drug-induced hyperglycemia 10/03/2019   Genetic testing 09/23/2019   Bone pain 09/13/2019   Restless leg 09/13/2019   Elevated liver enzymes 09/13/2019   Family history of ovarian cancer    Family history of prostate cancer    Family history of colon cancer    Family history of breast cancer    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

## 2021-02-04 NOTE — Consult Note (Addendum)
WOC Nurse Consult Note: Patient receiving care in [place]. RN, Marolyn Hammock in room to assist with Eakin pouch placement on fistula. Reason for Consult: Eakin pouch placement to [facility] fistula. Wound type: EC fistula Pressure Injury POA: Yes/No/NA Measurement: 1 cm x 3.8 cm x 3.2 cm.  All of the existing packing and overlayment of gauze dressings were removed prior to placement of the Eakin pouch.  All of the existing gauzes were saturated with golden brown secretions.  When removed, golden yellow/brown secretions bubbled out of the opening. Wound bed: pink Drainage (amount, consistency, odor) golden yellow/brown without odor Periwound: intact Dressing procedure/placement/frequency: THIS ORDER IS FOR INFORMATION ONLY; do NOT remove the Eakin pouch on the fistula unless it is leaking or it has been on 10 days. 1. Remove pouch, cleanse skin with water only.  2. Take one Barrier ring and fold it and place it into the indention along the suture line. Use as many as necessary to make a flat surface to place the Eakin pouch on.  3. Using the pattern left in the room, cut a new pouch the proper opening. 4. Make sure the skin and the barrier rings are clean and dry (no drainage on them). Place the Eakin pouch on the patient. 5. Take 2 activated warming packs and place them on the Eakin pouch. 6. Have the patient lie still with the warming packs for 15 - 30 minutes. Then she can move around. 7. Do NOT try to tape the pouch in place if it begins to leak.  Mickie Bail [phone]. Barrier ringKellie Simmering [phone]  Patient, her attending MD, and [facility] with questions, answered to their expressed satisfaction.  Thank you for the consult.  Discussed plan of care with the patient and bedside nurse.  [place] nurse will not follow at this time.  Please re-consult the [place] team if needed.  Val Riles, RN, MSN, CWOCN, CNS-BC, pager [phone]

## 2021-02-04 NOTE — Progress Notes (Signed)
2 Days Post-Op Procedure(s) (LRB): INCISION AND DRAINAGE ABSCESS (N/A)  Overnight she experienced worsening pain.   Subjective: Patient reports less pain currently.  No N/V.  Objective: Vital signs in last 24 hours: Temp:  [97.4 F (36.3 C)-98.6 F (37 C)] 98.6 F (37 C) (07/04 0444) Pulse Rate:  [50-76] 76 (07/04 0444) Resp:  [14-16] 14 (07/04 0444) BP: (100-117)/(79-91) 100/79 (07/04 0444) SpO2:  [99 %-100 %] 100 % (07/04 0444) Weight:  [90.2 kg] 90.2 kg (07/04 0444) Last BM Date: 01/31/21  Intake/Output from previous day: 07/03 0701 - 07/04 0700 In: 0  Out: 1800 [Urine:1800]  Physical Examination: General: alert and no distress Extremities: Homans sign is negative, no sign of DVT Abd: less induration; wound/ostomy RN applying stoma pouch hardware  Labs:  Results for orders placed or performed during the hospital encounter of 02/02/21 (from the past 24 hour(s))  CBC     Status: Abnormal   Collection Time: 02/04/21  8:38 AM  Result Value Ref Range   WBC 16.1 (H) 4.0 - 10.5 K/uL   RBC 3.58 (L) 3.87 - 5.11 MIL/uL   Hemoglobin 10.1 (L) 12.0 - 15.0 g/dL   HCT 32.0 (L) 36.0 - 46.0 %   MCV 89.4 80.0 - 100.0 fL   MCH 28.2 26.0 - 34.0 pg   MCHC 31.6 30.0 - 36.0 g/dL   RDW 14.1 11.5 - 15.5 %   Platelets 239 150 - 400 K/uL   nRBC 0.0 0.0 - 0.2 %  Basic metabolic panel     Status: Abnormal   Collection Time: 02/04/21  8:38 AM  Result Value Ref Range   Sodium 138 135 - 145 mmol/L   Potassium 3.4 (L) 3.5 - 5.1 mmol/L   Chloride 102 98 - 111 mmol/L   CO2 28 22 - 32 mmol/L   Glucose, Bld 121 (H) 70 - 99 mg/dL   BUN 6 6 - 20 mg/dL   Creatinine, Ser 0.66 0.44 - 1.00 mg/dL   Calcium 8.9 8.9 - 10.3 mg/dL   GFR, Estimated >60 >60 mL/min   Anion gap 8 5 - 15  Magnesium     Status: None   Collection Time: 02/04/21  8:38 AM  Result Value Ref Range   Magnesium 1.7 1.7 - 2.4 mg/dL  Phosphorus     Status: Abnormal   Collection Time: 02/04/21  8:38 AM  Result Value Ref Range    Phosphorus 2.4 (L) 2.5 - 4.6 mg/dL   CT ABDOMEN PELVIS W CONTRAST  Result Date: 02/03/2021 CLINICAL DATA:  Abdominal abscess rule out enterocutaneous fistula. Postoperative subcutaneous abscess versus enterocutaneous fistula. Increasing white blood cell count. History of ovarian cancer. EXAM: CT ABDOMEN AND PELVIS WITH CONTRAST TECHNIQUE: Multidetector CT imaging of the abdomen and pelvis was performed using the standard protocol following bolus administration of intravenous contrast. CONTRAST:  165mL OMNIPAQUE IOHEXOL 300 MG/ML  SOLN COMPARISON:  CT yesterday. FINDINGS: Lower chest: Increasing linear atelectasis in both lower lobes. Trace right pleural effusion. Hepatobiliary: Suspected hepatic steatosis. No focal lesion. Postcholecystectomy. Common bile duct at 10 mm is normal for cholecystectomy state. Slight intrahepatic biliary ductal dilatation. Pancreas: No ductal dilatation or inflammation. Spleen: Normal in size without focal abnormality. Adrenals/Urinary Tract: Normal adrenal glands. No hydronephrosis or perinephric edema. Homogeneous renal enhancement with symmetric excretion on delayed phase imaging. Urinary bladder is physiologically distended without wall thickening. Punctate focus of gas in the nondependent bladder may be related to prior instrumentation. 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 Fistula is demonstrated extending from small bowel that is adhered to the anterior abdominal wall, for example series 7, image 81 and series 2, image 52. Contrast extends to the subcutaneous tissues and to the skin surface to the left of the umbilicus. There is mild wall thickening of the adherent small bowel. There is no spillage of enteric contrast into the abdominopelvic cavity. Enteric contrast also reaches distally to the cecum and ascending colon. Small volume of formed  stool in the colon. Appendix measures 7 mm, series 2, image 71, but no periappendiceal fat stranding or inflammation. Vascular/Lymphatic: Normal caliber abdominal aorta patent portal vein. No abdominopelvic adenopathy. Reproductive: Hysterectomy without adnexal mass. Other: Again seen inflammatory changes in the left anterior abdominal wall with patchy soft tissue gas. The degree of soft tissue air has increased from CT yesterday, and enteric contrast is clearly demonstrated within the ill-defined collection extending to the skin surface to the left of the umbilicus. There is left greater than right abdominal wall skin thickening. Previously described tiny gas bubbles in the preperitoneal space in the midline likely was within small bowel, there is no free intra-abdominal air. No peripherally enhancing or organized collection. Similar trace free fluid in the left pelvis/anterior abdomen, series 2, image 66. Musculoskeletal: There are no acute or suspicious osseous abnormalities. IMPRESSION: 1. 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 The degree of soft tissue air has increased from CT yesterday, and enteric contrast is clearly demonstrated within the ill-defined collection extending to the skin surface to the left of the umbilicus. 2. No free intra-abdominal air. Previously described tiny gas bubbles in the preperitoneal space in the midline likely were within small bowel, there is no free intra-abdominal air. 3. Increasing linear atelectasis in both lower lobes. Trace right pleural effusion. These results will be called to the ordering clinician or representative by the Radiologist Assistant, and communication documented in the PACS or [employer]. Electronically Signed   By: Keith Rake M.D.   On: 02/03/2021 22:17    Assessment:  45 y.o. s/p  Procedure(s): INCISION AND DRAINAGE ABSCESS: stable and CT findings confirm as EC fistula  Pain:  Pain is not well-controlled on current regiment.  She has not received the acetaminophen suppositories  Heme: Anemia: mild, stable  ID: Abscess of Wound. Improvement radiologically.  Downward trending WBC.  GI:  See above. Gen surg note appreciated   FEN: Adequate u.o. Borderline Mg, K.    Prophylaxis: intermittent pneumatic compression boots.  Plan: NPO  Order placed for TPN per pharmacy protocol Review the CT findings to further elucidate the anatomy of the fistula Continue broad spectrum abx Measure I & O, daily weights Serial lytes, continue to monitor WBC trend Increase frequency of IV narcotic prn, IV NSAID, IV anxiolytic prn, acetaminophen supp pr Encourage ambulation   LOS: 1 day    Lahoma Crocker, MD 02/04/2021, 10:14 AM

## 2021-02-04 NOTE — Progress Notes (Addendum)
Patient complaining of uncontrolled pain with current medication regimen. Patient has been able to be active with walking back and forth to the bathroom, washing up, and walking around the room. On call provider made aware-no new orders placed. Incision has continued to drain, requiring several changes. This RN has had patient the past two nights and patient has been unable to sleep. Advised patient that in addition to reaching out to on call provider, I will also discuss with daytime RN in order to make attending providers aware. Note also written on whiteboard in patient's room.  On call doctor unable to provide orders due to patient not being triad. Spoke with on call surgery who states that she was asked not to provide orders. Spoke with on call gynecology who states that they are not covering patient.

## 2021-02-05 ENCOUNTER — Inpatient Hospital Stay: Payer: Self-pay

## 2021-02-05 LAB — DIFFERENTIAL
Abs Immature Granulocytes: 0.04 10*3/uL (ref 0.00–0.07)
Basophils Absolute: 0 10*3/uL (ref 0.0–0.1)
Basophils Relative: 0 %
Eosinophils Absolute: 0.3 10*3/uL (ref 0.0–0.5)
Eosinophils Relative: 3 %
Immature Granulocytes: 0 %
Lymphocytes Relative: 16 %
Lymphs Abs: 1.6 10*3/uL (ref 0.7–4.0)
Monocytes Absolute: 0.6 10*3/uL (ref 0.1–1.0)
Monocytes Relative: 6 %
Neutro Abs: 7.5 10*3/uL (ref 1.7–7.7)
Neutrophils Relative %: 75 %

## 2021-02-05 LAB — COMPREHENSIVE METABOLIC PANEL
ALT: 13 U/L (ref 0–44)
AST: 14 U/L — ABNORMAL LOW (ref 15–41)
Albumin: 3.1 g/dL — ABNORMAL LOW (ref 3.5–5.0)
Alkaline Phosphatase: 70 U/L (ref 38–126)
Anion gap: 9 (ref 5–15)
BUN: 6 mg/dL (ref 6–20)
CO2: 27 mmol/L (ref 22–32)
Calcium: 8.8 mg/dL — ABNORMAL LOW (ref 8.9–10.3)
Chloride: 102 mmol/L (ref 98–111)
Creatinine, Ser: 0.55 mg/dL (ref 0.44–1.00)
GFR, Estimated: >60 mL/min (ref >60)
Glucose, Bld: 162 mg/dL — ABNORMAL HIGH (ref 70–99)
Potassium: 3.8 mmol/L (ref 3.5–5.1)
Sodium: 138 mmol/L (ref 135–145)
Total Bilirubin: 0.3 mg/dL (ref 0.3–1.2)
Total Protein: 6.5 g/dL (ref 6.5–8.1)

## 2021-02-05 LAB — TRIGLYCERIDES: Triglycerides: 158 mg/dL — ABNORMAL HIGH (ref <150)

## 2021-02-05 LAB — GLUCOSE, CAPILLARY
Glucose-Capillary: 112 mg/dL — ABNORMAL HIGH (ref 70–99)
Glucose-Capillary: 128 mg/dL — ABNORMAL HIGH (ref 70–99)
Glucose-Capillary: 131 mg/dL — ABNORMAL HIGH (ref 70–99)
Glucose-Capillary: 138 mg/dL — ABNORMAL HIGH (ref 70–99)
Glucose-Capillary: 141 mg/dL — ABNORMAL HIGH (ref 70–99)

## 2021-02-05 LAB — CBC
HCT: 28.6 % — ABNORMAL LOW (ref 36.0–46.0)
Hemoglobin: 9.1 g/dL — ABNORMAL LOW (ref 12.0–15.0)
MCH: 28.3 pg (ref 26.0–34.0)
MCHC: 31.8 g/dL (ref 30.0–36.0)
MCV: 88.8 fL (ref 80.0–100.0)
Platelets: 237 10*3/uL (ref 150–400)
RBC: 3.22 MIL/uL — ABNORMAL LOW (ref 3.87–5.11)
RDW: 14.1 % (ref 11.5–15.5)
WBC: 10 10*3/uL (ref 4.0–10.5)
nRBC: 0 % (ref 0.0–0.2)

## 2021-02-05 LAB — PHOSPHORUS: Phosphorus: 4.1 mg/dL (ref 2.5–4.6)

## 2021-02-05 LAB — MAGNESIUM: Magnesium: 1.8 mg/dL (ref 1.7–2.4)

## 2021-02-05 LAB — PREALBUMIN: Prealbumin: 9.6 mg/dL — ABNORMAL LOW (ref 18–38)

## 2021-02-05 MED ORDER — HYDROMORPHONE HCL 1 MG/ML IJ SOLN
1.0000 mg | INTRAMUSCULAR | Status: DC | PRN
  Administered 2021-02-05 – 2021-02-08 (×19): 1 mg via INTRAVENOUS
  Filled 2021-02-05 (×20): qty 1

## 2021-02-05 MED ORDER — TRAVASOL 10 % IV SOLN
INTRAVENOUS | Status: AC
  Filled 2021-02-05: qty 720

## 2021-02-05 MED ORDER — ENOXAPARIN SODIUM 40 MG/0.4ML IJ SOSY
40.0000 mg | PREFILLED_SYRINGE | INTRAMUSCULAR | Status: DC
  Administered 2021-02-05 – 2021-02-09 (×5): 40 mg via SUBCUTANEOUS
  Filled 2021-02-05 (×5): qty 0.4

## 2021-02-05 MED ORDER — KCL IN DEXTROSE-NACL 20-5-0.45 MEQ/L-%-% IV SOLN
INTRAVENOUS | Status: AC
Start: 2021-02-05 — End: 2021-02-06
  Filled 2021-02-05 (×3): qty 1000

## 2021-02-05 NOTE — Progress Notes (Signed)
Initial Nutrition Assessment  DOCUMENTATION CODES:   Obesity unspecified  INTERVENTION:   Monitor magnesium, potassium, and phosphorus daily for at least 3 days, MD to replete as needed, as pt is at risk for refeeding syndrome.  -TPN management per Pharmacy  -Will monitor for any diet advancement  NUTRITION DIAGNOSIS:   Increased nutrient needs related to acute illness (EC fistula) as evidenced by estimated needs.  GOAL:   Patient will meet greater than or equal to 90% of their needs  MONITOR:   Labs, Weight trends, I & O's, PO intake, Diet advancement (TPN)  REASON FOR ASSESSMENT:   Consult New TPN/TNA  ASSESSMENT:   45 year old female with past medical history significant for ovarian tumor, surgically removed.  Also diabetes on metformin twice daily  Also history of small bowel obstructions.     Patient was recently in the hospital and underwent surgery for small bowel obstruction due to adhesions.  Her surgery was 6/22 she was discharged on 6/24.  Patient in room, TPN running at 40 ml/hr currently. Per Pharmacy note, TPN to advance to 60 ml/hr today.  Pt reports the last thing she ate was a salad prior to SBO and surgery on 6/22. Pt was unable to tolerate advancing her diet following that admission on 6/24. Reports constipation which is unusual for her as she typically has diarrhea after eating.  Pt feels discouraged that this has happened to her and does not know where to go from here as far as diet is concerned. Currently NPO. Per surgery note, pt may discharge on home TPN but we discussed some diet options if she were to have diet advanced at all. Pt also inquiring about exercise options and needing to find a PCP to help with her mental health.  Per patient she has lost 40 lbs since December 2021. Per weight records, pt has lost 21 lbs since 07/04/20 (9% wt loss x 7 months, insignificant for time frame).  Pt states she lost this weight as a result from not eating well  d/t chemo for her ovarian Ca.  Medications: D5 infusion, IV Zofran  Labs reviewed: CBGs: 83-128   NUTRITION - FOCUSED PHYSICAL EXAM:  No depletions noted.  Diet Order:   Diet Order             Diet NPO time specified  Diet effective now                   EDUCATION NEEDS:   Education needs have been addressed  Skin:  Skin Assessment: Skin Integrity Issues: Skin Integrity Issues:: Incisions Incisions: 7/2 abdomen  Last BM:  7/4 -per I/Os 50 ml of stool output  Height:   Ht Readings from Last 1 Encounters:  02/02/21 5\' 2"  (1.575 m)    Weight:   Wt Readings from Last 1 Encounters:  02/05/21 90.4 kg    BMI:  Body mass index is 36.45 kg/m.  Estimated Nutritional Needs:   Kcal:  1800-2000  Protein:  100-115g  Fluid:  2L/day  Clayton Bibles, MS, RD, LDN Inpatient Clinical Dietitian Contact information available via Amion

## 2021-02-05 NOTE — Progress Notes (Signed)
Pharmacy Antibiotic Note  Sarah Newman is a 45 y.o. female admitted on 02/02/2021 with  wound infection .  Pharmacy has been consulted for piperacillin/tazobactam dosing.  Day 4 Zosyn WBC improved SCr stable Afebrile Wound culture growing citrobacter awaiting sensitivities  Plan:  Continue piperacillin/tazobactam 3.375 g IV q8h EI Monitor renal function and culture data  Height: 5\' 2"  (157.5 cm) Weight: 90.4 kg (199 lb 4.7 oz) IBW/kg (Calculated) : 50.1  Temp (24hrs), Avg:98.6 F (37 C), Min:98.2 F (36.8 C), Max:99 F (37.2 C)  Recent Labs  Lab 02/02/21 1051 02/02/21 1241 02/03/21 0628 02/04/21 0838 02/05/21 0430  WBC 24.0*  --  24.5* 16.1* 10.0  CREATININE 0.62  --  0.59 0.66 0.55  LATICACIDVEN  --  1.4  --   --   --      Estimated Creatinine Clearance: 94.8 mL/min (by C-G formula based on SCr of 0.55 mg/dL).    Allergies  Allergen Reactions   Carboplatin Shortness Of Breath    Antimicrobials this admission: Piperacillin/tazobactam 7/2 >>  Cefepime/flagyl x1 7/2  Dose adjustments this admission:  Microbiology results:   7/2 BCx: ngtd UCx: not collected   7/2 abd wound: abundant GNR, rare GPC pairs = abundant citrobacter freundii - sensitivities still pending  Thank you for allowing pharmacy to be a part of this patient's care.  Kara Mead, PharmD 02/05/2021 9:27 AM

## 2021-02-05 NOTE — Progress Notes (Signed)
PHARMACY - TOTAL PARENTERAL NUTRITION CONSULT NOTE   Indication: Fistula  Patient Measurements: Height: 5\' 2"  (157.5 cm) Weight: 90.4 kg (199 lb 4.7 oz) IBW/kg (Calculated) : 50.1 TPN AdjBW (KG): 60.1 Body mass index is 36.45 kg/m. Adj Body Weight 66kg  Assessment: enterocutaneous fistula, hx of Ovarian Ca  Glucose / Insulin: hx DM? (previous Metformin, not taking at admission), CBGs at goal < 180 Electrolytes: WNL - K 3.8 and mag 1.8 Renal: SCr 0.55, BUN 6 Hepatic: AST/ALT 14/13, alkphos 70 Albumin/prealbumin: alb 3.1 Intake / Output; MIVF: n/a; D5 1/2NS with 20 mEq KCL at 110 ml/hr GI Imaging: GI Surgeries / Procedures: 6/22 Lap - enterotomy repair 7/2 I&D of abd abscess  Central access: has PAC TPN start date: 7/4  Nutritional Goals (per RD recommendation on ): pending  Goal (prior to RD consult) TPN rate is 80 mL/hr (provides 96 g of protein and 1750 kcals per day) based on ABW 66kg  Current Nutrition:  NPO  Plan:  At 1800: Increase TPN rate from 12mL/hr to 60 ml/hr Electrolytes in TPN: Na 38mEq/L, K 74mEq/L (increase), Ca 53mEq/L, Mg 32mEq/L, and Phos 50mmol/L. Cl:Ac 1:1 Add standard MVI and trace elements to TPN Continue Sensitive q6h SSI and adjust as needed  Reduce MIVF to 90 mL/hr at 1800 Monitor TPN labs on Mon/Thurs and as needed   Adrian Saran, PharmD, BCPS Secure Chat if ?s 02/05/2021 9:31 AM

## 2021-02-05 NOTE — Progress Notes (Signed)
Assessment & Plan: POD#3 - status post INCISION AND DRAINAGE ABSCESS - Dr. Dema Severin 02/02/2021 Enterocutaneous fistula History of ovarian cancer WOCN for Eakin pouch application NPO, TPN Monitor volume of output, continue IVF; replace for outputs  >1.2L/24hrs Discussed possibility of home TNA, wound care with [facility] in future  Will follow with you.        Armandina Gemma, MD       [facility]       Office: [phone]   Chief Complaint: Enterocutaneous fistula, history of ovarian carcinoma  Subjective: Patient in bed, comfortable, pleasant.  Objective: Vital signs in last 24 hours: Temp:  [98.2 F (36.8 C)-99 F (37.2 C)] 98.7 F (37.1 C) (07/05 0513) Pulse Rate:  [69-80] 80 (07/05 0513) Resp:  [15-18] 18 (07/05 0513) BP: (114-133)/(77-95) 126/83 (07/05 0513) SpO2:  [96 %-98 %] 96 % (07/05 0513) Weight:  [90 kg-90.4 kg] 90.4 kg (07/05 0500) Last BM Date: 01/31/21  Intake/Output from previous day: 07/04 0701 - 07/05 0700 In: 1227.4 [I.V.:893.7; IV Piggyback:333.7] Out: 700 [Urine:600; Stool:50] Intake/Output this shift: No intake/output data recorded.  Physical Exam: HEENT - sclerae clear, mucous membranes moist Neck - soft Chest - clear bilaterally Cor - RRR Abdomen - soft without distension; Eakin pouch with thin succus in bag Ext - no edema, non-tender Neuro - alert & oriented, no focal deficits  Lab Results:  Recent Labs    02/04/21 0838 02/05/21 0430  WBC 16.1* 10.0  HGB 10.1* 9.1*  HCT 32.0* 28.6*  PLT 239 237   BMET Recent Labs    02/04/21 0838 02/05/21 0430  NA 138 138  K 3.4* 3.8  CL 102 102  CO2 28 27  GLUCOSE 121* 162*  BUN 6 6  CREATININE 0.66 0.55  CALCIUM 8.9 8.8*   PT/INR Recent Labs    02/02/21 1209  LABPROT 13.5  INR 1.0   Comprehensive Metabolic Panel:    Component Value Date/Time   NA 138 02/05/2021 0430   NA 138 02/04/2021 0838   K 3.8 02/05/2021 0430   K 3.4 (L) 02/04/2021 0838   CL 102  02/05/2021 0430   CL 102 02/04/2021 0838   CO2 27 02/05/2021 0430   CO2 28 02/04/2021 0838   BUN 6 02/05/2021 0430   BUN 6 02/04/2021 0838   CREATININE 0.55 02/05/2021 0430   CREATININE 0.66 02/04/2021 0838   CREATININE 0.68 01/15/2021 0856   CREATININE 0.60 01/11/2021 1157   GLUCOSE 162 (H) 02/05/2021 0430   GLUCOSE 121 (H) 02/04/2021 0838   CALCIUM 8.8 (L) 02/05/2021 0430   CALCIUM 8.9 02/04/2021 0838   AST 14 (L) 02/05/2021 0430   AST 17 02/02/2021 1051   AST 18 01/15/2021 0856   AST 18 01/11/2021 1157   ALT 13 02/05/2021 0430   ALT 17 02/02/2021 1051   ALT 13 01/15/2021 0856   ALT 14 01/11/2021 1157   ALKPHOS 70 02/05/2021 0430   ALKPHOS 107 02/02/2021 1051   BILITOT 0.3 02/05/2021 0430   BILITOT 0.8 02/02/2021 1051   BILITOT 0.3 01/15/2021 0856   BILITOT 0.4 01/11/2021 1157   PROT 6.5 02/05/2021 0430   PROT 8.6 (H) 02/02/2021 1051   ALBUMIN 3.1 (L) 02/05/2021 0430   ALBUMIN 4.2 02/02/2021 1051    Studies/Results: CT ABDOMEN PELVIS W CONTRAST  Result Date: 02/03/2021 CLINICAL DATA:  Abdominal abscess rule out enterocutaneous fistula. Postoperative subcutaneous abscess versus enterocutaneous fistula. Increasing white blood cell count. History of ovarian cancer. EXAM: CT  ABDOMEN AND PELVIS WITH CONTRAST TECHNIQUE: Multidetector CT imaging of the abdomen and pelvis was performed using the standard protocol following bolus administration of intravenous contrast. CONTRAST:  124mL OMNIPAQUE IOHEXOL 300 MG/ML  SOLN COMPARISON:  CT yesterday. FINDINGS: Lower chest: Increasing linear atelectasis in both lower lobes. Trace right pleural effusion. Hepatobiliary: Suspected hepatic steatosis. No focal lesion. Postcholecystectomy. Common bile duct at 10 mm is normal for cholecystectomy state. Slight intrahepatic biliary ductal dilatation. Pancreas: No ductal dilatation or inflammation. Spleen: Normal in size without focal abnormality. Adrenals/Urinary Tract: Normal adrenal glands. No  hydronephrosis or perinephric edema. Homogeneous renal enhancement with symmetric excretion on delayed phase imaging. Urinary bladder is physiologically distended without wall thickening. Punctate focus of gas in the nondependent bladder may be related to prior instrumentation. Stomach/Bowel: Examination is positive for enterocutaneous fistula with enteric contrast extending into the ill-defined air fluid collection of the anterior abdominal wall. Fistula is demonstrated extending from small bowel that is adhered to the anterior abdominal wall, for example series 7, image 81 and series 2, image 52. Contrast extends to the subcutaneous tissues and to the skin surface to the left of the umbilicus. There is mild wall thickening of the adherent small bowel. There is no spillage of enteric contrast into the abdominopelvic cavity. Enteric contrast also reaches distally to the cecum and ascending colon. Small volume of formed stool in the colon. Appendix measures 7 mm, series 2, image 71, but no periappendiceal fat stranding or inflammation. Vascular/Lymphatic: Normal caliber abdominal aorta patent portal vein. No abdominopelvic adenopathy. Reproductive: Hysterectomy without adnexal mass. Other: Again seen inflammatory changes in the left anterior abdominal wall with patchy soft tissue gas. The degree of soft tissue air has increased from CT yesterday, and enteric contrast is clearly demonstrated within the ill-defined collection extending to the skin surface to the left of the umbilicus. There is left greater than right abdominal wall skin thickening. Previously described tiny gas bubbles in the preperitoneal space in the midline likely was within small bowel, there is no free intra-abdominal air. No peripherally enhancing or organized collection. Similar trace free fluid in the left pelvis/anterior abdomen, series 2, image 66. Musculoskeletal: There are no acute or suspicious osseous abnormalities. IMPRESSION: 1.  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 The degree of soft tissue air has increased from CT yesterday, and enteric contrast is clearly demonstrated within the ill-defined collection extending to the skin surface to the left of the umbilicus. 2. No free intra-abdominal air. Previously described tiny gas bubbles in the preperitoneal space in the midline likely were within small bowel, there is no free intra-abdominal air. 3. Increasing linear atelectasis in both lower lobes. Trace right pleural effusion. These results will be called to the ordering clinician or representative by the Radiologist Assistant, and communication documented in the PACS or [employer]. Electronically Signed   By: Keith Rake M.D.   On: 02/03/2021 22:17   [place] EKG SITE RITE  Result Date: 02/04/2021 If Site Rite image not attached, placement could not be confirmed due to current cardiac rhythm.     Armandina Gemma 02/05/2021   Patient ID: Sarah Newman, female   DOB: 1976-04-17, 45 y.o.   MRN: [id number]

## 2021-02-05 NOTE — Progress Notes (Signed)
3 Days Post-Op Procedure(s) (LRB): INCISION AND DRAINAGE ABSCESS (N/A)  Subjective: Patient reports no acute events. She had difficulty sleeping, mostly due to IV machine beeping and feeling as though her bowels kept rumbling. Denies any N/V. +flatus, denies BM. Has noticed some change in color of material in [place] pouch. Voiding freely. Ambulating yesterday, sat in chair.  Objective: Vital signs in last 24 hours: Temp:  [98.2 F (36.8 C)-99 F (37.2 C)] 98.7 F (37.1 C) (07/05 0513) Pulse Rate:  [69-80] 80 (07/05 0513) Resp:  [15-18] 18 (07/05 0513) BP: (114-133)/(77-95) 126/83 (07/05 0513) SpO2:  [96 %-98 %] 96 % (07/05 0513) Weight:  [198 lb 6.4 oz (90 kg)-199 lb 4.7 oz (90.4 kg)] 199 lb 4.7 oz (90.4 kg) (07/05 0500) Last BM Date: 01/31/21  Intake/Output from previous day: 07/04 0701 - 07/05 0700 In: 1227.4 [I.V.:893.7; IV Piggyback:333.7] Out: 700 [Urine:600; Stool:50]  100cc of output recorded from time Eakin pouch put on mid-day yesterday  Physical Examination: Gen: NAD CV: regular rate and rhythm, no murmurs, rubs or gallops Pulm: clear to ausculation bilaterally, no wheezes or rhonchi Abd: soft, moderately tender to palpation in the mid and left abdomen around recent surgical site. Eakin pouch in place, minimal yellow/green thin fluid c/w succus in pouch Ext: warm and well perfused, no edema, no calf tenderness to palpation  Labs: WBC/Hgb/Hct/Plts:  10.0/9.1/28.6/237 (07/05 0430) BUN/Cr/glu/ALT/AST/amyl/lip:  6/0.55/--/13/14/--/-- (07/05 0430)  CBC    Component Value Date/Time   WBC 10.0 02/05/2021 0430   RBC 3.22 (L) 02/05/2021 0430   HGB 9.1 (L) 02/05/2021 0430   HGB 10.3 (L) 01/15/2021 0856   HCT 28.6 (L) 02/05/2021 0430   PLT 237 02/05/2021 0430   PLT 560 (H) 01/15/2021 0856   MCV 88.8 02/05/2021 0430   MCH 28.3 02/05/2021 0430   MCHC 31.8 02/05/2021 0430   RDW 14.1 02/05/2021 0430   LYMPHSABS 1.6 02/05/2021 0430   MONOABS 0.6 02/05/2021 0430   EOSABS  0.3 02/05/2021 0430   BASOSABS 0.0 02/05/2021 0430   CMP Latest Ref Rng & Units 02/05/2021 02/04/2021 02/03/2021  Glucose 70 - 99 mg/dL 162(H) 121(H) 215(H)  BUN 6 - 20 mg/dL 6 6 8   Creatinine 0.44 - 1.00 mg/dL 0.55 0.66 0.59  Sodium 135 - 145 mmol/L 138 138 135  Potassium 3.5 - 5.1 mmol/L 3.8 3.4(L) 4.1  Chloride 98 - 111 mmol/L 102 102 102  CO2 22 - 32 mmol/L 27 28 24   Calcium 8.9 - 10.3 mg/dL 8.8(L) 8.9 9.1  Total Protein 6.5 - 8.1 g/dL 6.5 - -  Total Bilirubin 0.3 - 1.2 mg/dL 0.3 - -  Alkaline Phos 38 - 126 U/L 70 - -  AST 15 - 41 U/L 14(L) - -  ALT 0 - 44 U/L 13 - -   CT A/P on 7/3: 1. 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 The degree of soft tissue air has increased from CT yesterday, and enteric contrast is clearly demonstrated within the ill-defined collection extending to the skin surface to the left of the umbilicus. 2. No free intra-abdominal air. Previously described tiny gas bubbles in the preperitoneal space in the midline likely were within small bowel, there is no free intra-abdominal air. 3. Increasing linear atelectasis in both lower lobes. Trace right pleural effusion.  Assessment:  45 y.o. with history of clear cell ovarian cancer diagnosed initially in 07/2019 most recently  undergoing treatment with gemzar and avastin (although most recent imaging without clear evidence of current disease, normal CA-125) who underwent diagnostic lsc with conversion to open in the setting of significant abdominal adhesions thought to be contributing to intermittent symptoms of partial SBO c/b unavoidable 1cm small bowel enterotomy with oversew now with enterocutaneous fistula.  I have spent some time over the last few days reviewing with the patient findings from time of surgery on 7/2 (no clear evidence of ECF), CT with oral contrast  performed on 7/3, and now clear evidence confirming fistula. I've reviewed pictures with her of normal anatomy as well as what we are seeing on her CT. We've discussed that her adhesive disease as well as recent treatment with avastin increased her risk of fistula formation.  Wound care saw the patient yesterday and she now has an Eakin pouch in place. She's had about 100 cc of output in 16 hours. Plan to continue close monitoring of output. She's been NPO since 7/3 on IVFs, TPN started yesterday. Appreciate nutrition's recommendations regarding TPN; awaiting cycling.  Will plan to continue on broad spectrum antibiotics with Zosyn.  Discussed with patient's nurse today attempt at another site for peripheral IV. If this appears challenging, can consider PICC line placement.  Plan: - continue TPN/IVFs, NPO for bowel rest - Eakin pouch to incision, close monitoring of fistula output - up and out of bed, lovenox for DVT prophylaxis    LOS: 2 days    Lafonda Mosses 02/05/2021, 8:32 AM

## 2021-02-06 LAB — COMPREHENSIVE METABOLIC PANEL
ALT: 15 U/L (ref 0–44)
AST: 18 U/L (ref 15–41)
Albumin: 3 g/dL — ABNORMAL LOW (ref 3.5–5.0)
Alkaline Phosphatase: 75 U/L (ref 38–126)
Anion gap: 8 (ref 5–15)
BUN: 7 mg/dL (ref 6–20)
CO2: 27 mmol/L (ref 22–32)
Calcium: 8.8 mg/dL — ABNORMAL LOW (ref 8.9–10.3)
Chloride: 103 mmol/L (ref 98–111)
Creatinine, Ser: 0.56 mg/dL (ref 0.44–1.00)
GFR, Estimated: >60 mL/min (ref >60)
Glucose, Bld: 125 mg/dL — ABNORMAL HIGH (ref 70–99)
Potassium: 4 mmol/L (ref 3.5–5.1)
Sodium: 138 mmol/L (ref 135–145)
Total Bilirubin: 0.3 mg/dL (ref 0.3–1.2)
Total Protein: 6.6 g/dL (ref 6.5–8.1)

## 2021-02-06 LAB — GLUCOSE, CAPILLARY
Glucose-Capillary: 144 mg/dL — ABNORMAL HIGH (ref 70–99)
Glucose-Capillary: 107 mg/dL — ABNORMAL HIGH (ref 70–99)
Glucose-Capillary: 126 mg/dL — ABNORMAL HIGH (ref 70–99)
Glucose-Capillary: 110 mg/dL — ABNORMAL HIGH (ref 70–99)

## 2021-02-06 LAB — PHOSPHORUS: Phosphorus: 4.4 mg/dL (ref 2.5–4.6)

## 2021-02-06 LAB — MAGNESIUM: Magnesium: 1.8 mg/dL (ref 1.7–2.4)

## 2021-02-06 MED ORDER — KCL IN DEXTROSE-NACL 20-5-0.45 MEQ/L-%-% IV SOLN
INTRAVENOUS | Status: DC
  Filled 2021-02-06 (×4): qty 1000

## 2021-02-06 MED ORDER — SODIUM CHLORIDE 0.9% FLUSH
10.0000 mL | Freq: Two times a day (BID) | INTRAVENOUS | Status: DC
  Administered 2021-02-08 – 2021-02-09 (×2): 10 mL

## 2021-02-06 MED ORDER — PANTOPRAZOLE SODIUM 40 MG IV SOLR
40.0000 mg | INTRAVENOUS | Status: DC
  Administered 2021-02-06 – 2021-02-10 (×5): 40 mg via INTRAVENOUS
  Filled 2021-02-06 (×5): qty 40

## 2021-02-06 MED ORDER — HEPARIN SOD (PORK) LOCK FLUSH 100 UNIT/ML IV SOLN: 500.0000 [IU] | INTRAVENOUS | Status: DC

## 2021-02-06 MED ORDER — SODIUM CHLORIDE 0.9% FLUSH: 10.0000 mL | INTRAVENOUS | Status: DC | PRN

## 2021-02-06 MED ORDER — HEPARIN SOD (PORK) LOCK FLUSH 100 UNIT/ML IV SOLN: 500.0000 [IU] | INTRAVENOUS | Status: DC | PRN

## 2021-02-06 MED ORDER — TRAVASOL 10 % IV SOLN
INTRAVENOUS | Status: AC
  Filled 2021-02-06: qty 1056

## 2021-02-06 MED ORDER — METRONIDAZOLE 500 MG/100ML IV SOLN
500.0000 mg | Freq: Three times a day (TID) | INTRAVENOUS | Status: DC
  Administered 2021-02-06 – 2021-02-08 (×6): 500 mg via INTRAVENOUS
  Filled 2021-02-06 (×6): qty 100

## 2021-02-06 MED ORDER — SODIUM CHLORIDE 0.9 % IV SOLN
2.0000 g | Freq: Three times a day (TID) | INTRAVENOUS | Status: DC
  Administered 2021-02-06 – 2021-02-08 (×6): 2 g via INTRAVENOUS
  Filled 2021-02-06 (×8): qty 2

## 2021-02-06 NOTE — Progress Notes (Addendum)
PHARMACY - TOTAL PARENTERAL NUTRITION CONSULT NOTE   Indication: Fistula  Patient Measurements: Height: 5\' 2"  (157.5 cm) Weight: 90.2 kg (198 lb 13.7 oz) IBW/kg (Calculated) : 50.1 TPN AdjBW (KG): 60.1 Body mass index is 36.37 kg/m. Adj Body Weight 66kg  Assessment: enterocutaneous fistula, hx of Ovarian Ca  Glucose / Insulin: hx DM? (previous Metformin, not taking at admission), CBGs continue to be at goal < 180 Electrolytes: WNL Renal: SCr 0.56 stable, BUN 7 stable Hepatic: AST/ALT 18/15 stable, alkphos 75 stable Albumin/prealbumin: alb 3, prealb 9.6 Intake / Output; MIVF: unsure correct as no output charted last 24hr but noted to have urinated 8x; D5 1/2NS with 20 mEq KCL at 90 ml/hr GI Imaging: GI Surgeries / Procedures: 6/22 Lap - enterotomy repair 7/2 I&D of abd abscess  Central access: has PAC TPN start date: 7/4  Nutritional Goals (per RD recommendation on 7/5): 100-115 g protein/day, 1800-2000 kcal/day, 2L/day  Goal (prior to RD consult) TPN rate is 80 mL/hr (provides 105 g of protein and 1977 kcals per day) based on ABW 66kg  Current Nutrition:  NPO  Plan:  At 1800: Increase TPN rate from 33mL/hr to goal 80 ml/hr Electrolytes in TPN: Na 9mEq/L, K 64mEq/L (increase), Ca 41mEq/L, Mg 35mEq/L, and Phos 78mmol/L. Cl:Ac 1:1 Add standard MVI and trace elements to TPN Continue Sensitive q6h SSI and adjust as needed  Reduce MIVF to 70 mL/hr at 1800, further adjustments per Md Monitor TPN labs on Mon/Thurs and as needed   Adrian Saran, PharmD, BCPS Secure Chat if ?s 02/06/2021 8:27 AM   Addendum:  If patient discharged today or tomorrow, would recommend using TPN formula ordered today then manage per [place], PharmD, BCPS Secure Chat if ?s 02/06/2021 10:22 AM

## 2021-02-06 NOTE — Progress Notes (Signed)
4 Days Post-Op Procedure(s) (LRB): INCISION AND DRAINAGE ABSCESS (N/A)  Subjective: Patient reports mild abdominal tenderness that is improving. Intermittent nausea reported without emesis. Reports having a solid bowel movement. Flatus is coming into the eakin pouch. She has been emptying the pouch herself and emptied 2 oz this am. She reports insomnia due to IV pump alarming, staff entering the room. Ambulating without difficulty. Denies chest pain, dyspnea. All questions answered.     Objective: Vital signs in last 24 hours: Temp:  [98 F (36.7 C)-99.2 F (37.3 C)] 98 F (36.7 C) (07/06 0506) Pulse Rate:  [73-87] 73 (07/06 0506) Resp:  [15-18] 18 (07/06 0506) BP: (120-137)/(92-94) 123/92 (07/06 0506) SpO2:  [95 %-100 %] 100 % (07/06 0506) Weight:  [198 lb 13.7 oz (90.2 kg)] 198 lb 13.7 oz (90.2 kg) (07/06 0500) Last BM Date: 02/06/21  Intake/Output from previous day: 07/05 0701 - 07/06 0700 In: 2023.5 [I.V.:1839.9; IV Piggyback:183.6] Out: 50   Physical Examination: General: alert, cooperative, and no distress Resp: clear to auscultation bilaterally Cardio: regular rate and rhythm, S1, S2 normal, no murmur, click, rub or gallop GI: abdomen obese, eakin pouch draining with pouch seal intact, active bowel sounds, soft, non-tympanic Extremities: extremities normal, atraumatic, no cyanosis or edema Right chest port a cath with TPN running. Dressing intact.  Labs:   BUN/Cr/glu/ALT/AST/amyl/lip:  7/0.56/--/15/18/--/-- (07/06 0702)  Assessment: 45 y.o. with history of clear cell ovarian cancer diagnosed initially in 07/2019 most recently undergoing treatment with gemzar and avastin (although most recent imaging without clear evidence of current disease, normal CA-125) who underwent diagnostic lsc with conversion to open in the setting of significant abdominal adhesions thought to be contributing to intermittent symptoms of partial SBO c/b unavoidable 1cm small bowel enterotomy with  oversew now with enterocutaneous fistula.  45 y.o. s/p Procedure(s): INCISION AND DRAINAGE ABSCESS: stable Pain:  Pain is well-controlled on PRN medications.  Heme: Last CBC on 02/05/21 Hgb 9.1 and Hct 28.6.  ID:WBC 10 on 02/05/21 am from 16.1 on 02/04/21. Flagyl IV and Cefepime IV ordered.  CV: BP and HR stable. Continue to monitor.  GI:  Tolerating po: NPO due to enterocutaneous fistula. TPN being administered.  GU: Voiding without difficulty.   FEN: No critical values reported.  Endo: Diabetes Type 2, CBG (last 3)  Recent Labs    02/05/21 1748 02/05/21 2341 02/06/21 0508  GLUCAP 131* 141* 144*     Prophylaxis: Lovenox ordered.  Plan: Continue plan of care  Place referral for home health to begin working on discharge needs Dispo:  Discharge plan to include :case management The patient is to be discharged to home with home health.   LOS: 3 days    Sarah Newman 02/06/2021, 1:14 PM

## 2021-02-06 NOTE — TOC Initial Note (Signed)
Transition of Care [facility]) - Initial/Assessment Note    Patient Details  Name: Sarah Newman MRN: [id number] Date of Birth: 1976/04/11  Transition of Care [facility]) CM/SW Contact:    Lynnell Catalan, RN Phone Number: 02/06/2021, 1:03 PM  Clinical Narrative:                 Spoke with pt at bedside for dc planning. Pt states she will be going home with home TPN. Choice offered for home health and home infusion services. [facility] chosen for home infusion services and Pam from [employer] contacted for referral. TOC will work along with [employer] on finding a [place] that will take [employer]. TOC will continue to follow along. Will need MD orders for [facility] and [facility] for TPN at dc.  Expected Discharge Plan: [place] Barriers to Discharge: Continued Medical Work up   Patient Goals and CMS Choice Patient states their goals for this hospitalization and ongoing recovery are:: To go home. CMS Medicare.gov Compare Post Acute Care list provided to:: Patient Choice offered to / list presented to : Patient  Expected Discharge Plan and Services Expected Discharge Plan: [place]   Discharge Planning Services: CM Consult Post Acute Care Choice: [place] arrangements for the past 2 months: Single Family Home                           HH Arranged: RN (Home TPN) [place] Agency: [facility] Date HH Agency Contacted: 02/06/21 Time HH Agency Contacted: 1000 Representative spoke with at [place]: [place] Arrangements/Services Living arrangements for the past 2 months: [place] with:: Spouse Patient language and need for interpreter reviewed:: Yes Do you feel safe going back to the place where you live?: Yes      Need for Family Participation in Patient Care: Yes (Comment) Care giver support system in place?: Yes (comment)   Criminal Activity/Legal Involvement Pertinent to Current Situation/Hospitalization: No - Comment as  needed  Activities of Daily Living Home Assistive Devices/Equipment: None ADL Screening (condition at time of admission) Patient's cognitive ability adequate to safely complete daily activities?: Yes Is the patient deaf or have difficulty hearing?: No Does the patient have difficulty seeing, even when wearing glasses/contacts?: No Does the patient have difficulty concentrating, remembering, or making decisions?: No Patient able to express need for assistance with ADLs?: Yes Does the patient have difficulty dressing or bathing?: No Independently performs ADLs?: Yes (appropriate for developmental age) Does the patient have difficulty walking or climbing stairs?: No Weakness of Legs: None Weakness of Arms/Hands: None  Permission Sought/Granted Permission sought to share information with : Facility Art therapist granted to share information with : Yes, Verbal Permission Granted     Permission granted to share info w AGENCY: [facility]        Emotional Assessment Appearance:: Appears stated age Attitude/Demeanor/Rapport: Gracious Affect (typically observed): Calm Orientation: : Oriented to Self, Oriented to Place, Oriented to  Time, Oriented to Situation Alcohol / Substance Use: Not Applicable Psych Involvement: No (comment)  Admission diagnosis:  Abscess [L02.91] Postoperative abscess [T81.49XA] Patient Active Problem List   Diagnosis Date Noted   Postoperative abscess 02/02/2021   Abscess    Intermittent small bowel obstruction due to adhesions ([place]) 01/23/2021   Intra-abdominal adhesions    Nausea with vomiting 01/11/2021   SBO (small bowel obstruction) ([place]) 01/11/2021   Poor dentition 12/10/2020  Pancytopenia, acquired ([place]) 12/10/2020   Allergic reaction 10/15/2020   Physical debility 10/01/2020   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   Constipation 09/20/2020   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   Cancer associated pain 09/14/2020   Lab  test positive for detection of COVID-19 virus 09/10/2020   Hypokalemia 08/30/2020   Obesity, Class II, BMI 35-39.9 07/17/2020   Anemia, chronic disease 05/29/2020   UTI (urinary tract infection) 01/09/2020   Dysuria 01/06/2020   Anemia due to antineoplastic chemotherapy 12/05/2019   Gastritis 12/05/2019   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/2019   Weight gain 10/25/2019   Vaginal dryness 10/24/2019   Drug-induced hyperglycemia 10/03/2019   Genetic testing 09/23/2019   Bone pain 09/13/2019   Restless leg 09/13/2019   Elevated liver enzymes 09/13/2019   Family history of ovarian cancer    Family history of prostate cancer    Family history of colon cancer    Family history of breast cancer    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

## 2021-02-07 LAB — COMPREHENSIVE METABOLIC PANEL
ALT: 17 U/L (ref 0–44)
AST: 21 U/L (ref 15–41)
Albumin: 3.2 g/dL — ABNORMAL LOW (ref 3.5–5.0)
Alkaline Phosphatase: 77 U/L (ref 38–126)
Anion gap: 8 (ref 5–15)
BUN: 8 mg/dL (ref 6–20)
CO2: 27 mmol/L (ref 22–32)
Calcium: 9.1 mg/dL (ref 8.9–10.3)
Chloride: 102 mmol/L (ref 98–111)
Creatinine, Ser: 0.47 mg/dL (ref 0.44–1.00)
GFR, Estimated: >60 mL/min (ref >60)
Glucose, Bld: 117 mg/dL — ABNORMAL HIGH (ref 70–99)
Potassium: 4 mmol/L (ref 3.5–5.1)
Sodium: 137 mmol/L (ref 135–145)
Total Bilirubin: 0.3 mg/dL (ref 0.3–1.2)
Total Protein: 7 g/dL (ref 6.5–8.1)

## 2021-02-07 LAB — CULTURE, BLOOD (SINGLE)
Culture: NO GROWTH
Culture: NO GROWTH
Special Requests: ADEQUATE
Special Requests: ADEQUATE

## 2021-02-07 LAB — GLUCOSE, CAPILLARY
Glucose-Capillary: 124 mg/dL — ABNORMAL HIGH (ref 70–99)
Glucose-Capillary: 123 mg/dL — ABNORMAL HIGH (ref 70–99)
Glucose-Capillary: 134 mg/dL — ABNORMAL HIGH (ref 70–99)

## 2021-02-07 LAB — PHOSPHORUS: Phosphorus: 4.9 mg/dL — ABNORMAL HIGH (ref 2.5–4.6)

## 2021-02-07 LAB — MAGNESIUM: Magnesium: 1.8 mg/dL (ref 1.7–2.4)

## 2021-02-07 MED ORDER — TRAVASOL 10 % IV SOLN
INTRAVENOUS | Status: AC
  Filled 2021-02-07: qty 1056

## 2021-02-07 MED ORDER — INSULIN ASPART 100 UNIT/ML IJ SOLN
0.0000 [IU] | Freq: Three times a day (TID) | INTRAMUSCULAR | Status: DC
  Administered 2021-02-07 – 2021-02-08 (×3): 1 [IU] via SUBCUTANEOUS

## 2021-02-07 MED ORDER — TRAVASOL 10 % IV SOLN: INTRAVENOUS | Status: DC

## 2021-02-07 MED ORDER — SCOPOLAMINE 1 MG/3DAYS TD PT72
1.0000 | MEDICATED_PATCH | TRANSDERMAL | Status: DC
  Filled 2021-02-07: qty 1

## 2021-02-07 NOTE — Progress Notes (Addendum)
PHARMACY - TOTAL PARENTERAL NUTRITION CONSULT NOTE   Indication: Fistula  Patient Measurements: Height: 5\' 2"  (157.5 cm) Weight: 90.4 kg (199 lb 4.7 oz) IBW/kg (Calculated) : 50.1 TPN AdjBW (KG): 60.1 Body mass index is 36.45 kg/m. Adj Body Weight 66kg  Assessment: enterocutaneous fistula, hx of Ovarian Ca  Glucose / Insulin: hx DM? (previous Metformin, not taking at admission), CBGs continue to be at goal < 180, 1 unit SSI past 24h Electrolytes: WNL except phos slightly elevated at 4.9 Renal: SCr 0.47 stable, BUN 8 stable Hepatic: AST/ALT 21/17 stable, alkphos 77 stable Albumin/prealbumin: alb 3.2, prealb 9.6 Intake / Output; MIVF: I/O unreliable with multiple unmeasured UOP; D5 1/2NS with 20 mEq KCL at 70 ml/hr GI Imaging: GI Surgeries / Procedures: 6/22 Lap - enterotomy repair 7/2 I&D of abd abscess  Central access: has PAC TPN start date: 7/4  Nutritional Goals (per RD recommendation on 7/5): 100-115 g protein/day, 1800-2000 kcal/day, 2L/day  Goal (prior to RD consult) TPN rate is 80 mL/hr (provides 105 g of protein and 1977 kcals per day) based on ABW 66kg  Current Nutrition:  NPO  Plan:  At 1800: Continue TPN at goal rate of 80 ml/hr Electrolytes in TPN: Na 58mEq/L, K 30mEq/L, Ca 5mEq/L, Mg 55mEq/L, and Phos 62mmol/L (decrease). Cl:Ac 1:1 Add standard MVI and trace elements to TPN Reduce to Sensitive q8h SSI and adjust as needed  Contniue MIVF at 70 mL/hr at 1800, further adjustments per Md Monitor TPN labs on Mon/Thurs and as needed If patient discharged today or tomorrow, would recommend using TPN formula ordered today then manage per [place] D  02/07/2021 9:34 AM

## 2021-02-07 NOTE — TOC Progression Note (Signed)
Transition of Care [facility]) - Progression Note    Patient Details  Name: Sarah Newman MRN: [id number] Date of Birth: [birth year]  Transition of Care [facility]) CM/SW Contact  Siani Utke, Marjie Skiff, RN Phone Number: 02/07/2021, 12:20 PM  Clinical Narrative:    This CM was able to secure [facility] with [facility], [facility], [facility]. [place] are able to start TPN services at home on Sunday 7/10. TOC will continue to follow and assist with DC.   Expected Discharge Plan: [place] Barriers to Discharge: Continued Medical Work up  Expected Discharge Plan and Services Expected Discharge Plan: [place]   Discharge Planning Services: CM Consult Post Acute Care Choice: [place] arrangements for the past 2 months: Single Family Home                    HH Arranged: RN (Home TPN) [place]: [facility] Date HH Agency Contacted: 02/06/21 Time HH Agency Contacted: 1000 Representative spoke with at [place]: [place] ([place]) Interventions    Readmission Risk Interventions Readmission Risk Prevention Plan 02/06/2021  Transportation Screening Complete  PCP or Specialist Appt within 5-7 Days Complete  Home Care Screening Complete  Medication Review (RN CM) Complete  Some recent data might be hidden

## 2021-02-07 NOTE — Progress Notes (Signed)
5 Days Post-Op Procedure(s) (LRB): INCISION AND DRAINAGE ABSCESS (N/A)  Subjective: Patient reports doing well. Kids are at bed side. Had one small episode of emesis associated with the hiccups and trying to drink water. Denies nausea. + flatus, BM. Has been sitting in chair and ambulating. Having less but some pain still to the left of her ECF.   Objective: Vital signs in last 24 hours: Temp:  [97.8 F (36.6 C)-98.6 F (37 C)] 97.8 F (36.6 C) (07/07 0604) Pulse Rate:  [84-94] 94 (07/07 0604) Resp:  [18-20] 18 (07/07 0604) BP: (135-138)/(94-101) 135/94 (07/07 0604) SpO2:  [97 %-99 %] 97 % (07/07 0604) Weight:  [199 lb 4.7 oz (90.4 kg)] 199 lb 4.7 oz (90.4 kg) (07/07 0500) Last BM Date: 02/06/21  Intake/Output from previous day: 07/06 0701 - 07/07 0700 In: 2001.6 [I.V.:1768.4; IV Piggyback:233.3] Out: 1460 [Urine:1400]  Physical Examination: Gen: NAD CV: regular rate and rhythm, no murmurs, rubs or gallops Pulm: clear to ausculation bilaterally, no wheezes or rhonchi Abd: soft, moderately tender to palpation superior to pouch and to the left lateral aspect. Eakin pouch in place, minimal yellow/green thin fluid c/w succus in pouch Ext: warm and well perfused, no edema, no calf tenderness to palpation  Labs:   BUN/Cr/glu/ALT/AST/amyl/lip:  8/0.47/--/17/21/--/-- (07/07 0521)  CBC    Component Value Date/Time   WBC 10.0 02/05/2021 0430   RBC 3.22 (L) 02/05/2021 0430   HGB 9.1 (L) 02/05/2021 0430   HGB 10.3 (L) 01/15/2021 0856   HCT 28.6 (L) 02/05/2021 0430   PLT 237 02/05/2021 0430   PLT 560 (H) 01/15/2021 0856   MCV 88.8 02/05/2021 0430   MCH 28.3 02/05/2021 0430   MCHC 31.8 02/05/2021 0430   RDW 14.1 02/05/2021 0430   LYMPHSABS 1.6 02/05/2021 0430   MONOABS 0.6 02/05/2021 0430   EOSABS 0.3 02/05/2021 0430   BASOSABS 0.0 02/05/2021 0430   Assessment:  45 y.o. with history of clear cell ovarian cancer diagnosed initially in 07/2019 most recently undergoing treatment  with gemzar and avastin (although most recent imaging without clear evidence of current disease, normal CA-125) who underwent diagnostic lsc with conversion to open in the setting of significant abdominal adhesions thought to be contributing to intermittent symptoms of partial SBO c/b unavoidable 1cm small bowel enterotomy with oversew now with enterocutaneous fistula.  Output has been <125 cc/day since Eakin pouch placed. Patient doing well on TPN. Spoke with pharmacy about working towards cycling of her TPN. Home health here today, gave Marylee first lesson regarding care for TPN at home. She is motivated to learn and very much looking forward to going home.  Antibiotic coverage narrowed based on cultures to flagyl and cefepime. Will plan for 7 days of treatment through Saturday. Will then watch patient for 24-48 hrs off antibiotics while finalizing plan for discharge on TPN. Anticipate discharge early next week.    Plan: - continue TPN/IVFs, NPO for bowel rest - Eakin pouch to incision, close monitoring of fistula output - up and out of bed, lovenox for DVT prophylaxis     LOS: 4 days    Lafonda Mosses 02/07/2021, 3:37 PM

## 2021-02-07 NOTE — Progress Notes (Signed)
     Assessment & Plan: POD#4 - status post INCISION AND DRAINAGE ABSCESS - Dr. Dema Severin 02/02/2021 Enterocutaneous fistula History of ovarian cancer WOCN following Eakin pouch application NPO, TPN Continues on abx's for now [facility], pharmacy working on home TNA arrangements - targeting Sunday, 7/10   Will follow with you and arrange follow up at [place] office with Dr. Dema Severin.        Armandina Gemma, MD       [facility]       Office: [phone]   Chief Complaint: Enterocutaneous fistula  Subjective: Patient up in chair, working with [facility] from [place] for home TNA program  Objective: Vital signs in last 24 hours: Temp:  [97.8 F (36.6 C)-98.6 F (37 C)] 97.8 F (36.6 C) (07/07 0604) Pulse Rate:  [84-94] 94 (07/07 0604) Resp:  [18-20] 18 (07/07 0604) BP: (135-138)/(94-101) 135/94 (07/07 0604) SpO2:  [97 %-99 %] 97 % (07/07 0604) Weight:  [90.4 kg] 90.4 kg (07/07 0500) Last BM Date: 02/06/21  Intake/Output from previous day: 07/06 0701 - 07/07 0700 In: 2001.6 [I.V.:1768.4; IV Piggyback:233.3] Out: 1460 [Urine:1400] Intake/Output this shift: Total I/O In: -  Out: 950 [Urine:900; Other:50]  Physical Exam: HEENT - sclerae clear, mucous membranes moist Neck - soft Abdomen - pouch in place, approx 100cc succus per day Ext - no edema, non-tender Neuro - alert & oriented, no focal deficits  Lab Results:  Recent Labs    02/05/21 0430  WBC 10.0  HGB 9.1*  HCT 28.6*  PLT 237   BMET Recent Labs    02/06/21 0702 02/07/21 0521  NA 138 137  K 4.0 4.0  CL 103 102  CO2 27 27  GLUCOSE 125* 117*  BUN 7 8  CREATININE 0.56 0.47  CALCIUM 8.8* 9.1   PT/INR No results for input(s): LABPROT, INR in the last 72 hours. Comprehensive Metabolic Panel:    Component Value Date/Time   NA 137 02/07/2021 0521   NA 138 02/06/2021 0702   K 4.0 02/07/2021 0521   K 4.0 02/06/2021 0702   CL 102 02/07/2021 0521   CL 103 02/06/2021 0702   CO2 27 02/07/2021 0521   CO2  27 02/06/2021 0702   BUN 8 02/07/2021 0521   BUN 7 02/06/2021 0702   CREATININE 0.47 02/07/2021 0521   CREATININE 0.56 02/06/2021 0702   CREATININE 0.68 01/15/2021 0856   CREATININE 0.60 01/11/2021 1157   GLUCOSE 117 (H) 02/07/2021 0521   GLUCOSE 125 (H) 02/06/2021 0702   CALCIUM 9.1 02/07/2021 0521   CALCIUM 8.8 (L) 02/06/2021 0702   AST 21 02/07/2021 0521   AST 18 02/06/2021 0702   AST 18 01/15/2021 0856   AST 18 01/11/2021 1157   ALT 17 02/07/2021 0521   ALT 15 02/06/2021 0702   ALT 13 01/15/2021 0856   ALT 14 01/11/2021 1157   ALKPHOS 77 02/07/2021 0521   ALKPHOS 75 02/06/2021 0702   BILITOT 0.3 02/07/2021 0521   BILITOT 0.3 02/06/2021 0702   BILITOT 0.3 01/15/2021 0856   BILITOT 0.4 01/11/2021 1157   PROT 7.0 02/07/2021 0521   PROT 6.6 02/06/2021 0702   ALBUMIN 3.2 (L) 02/07/2021 0521   ALBUMIN 3.0 (L) 02/06/2021 0702    Studies/Results: No results found.    Armandina Gemma 02/07/2021   Patient ID: [name], female   DOB: Oct 08, 1975, 45 y.o.   MRN: [id number]

## 2021-02-08 ENCOUNTER — Inpatient Hospital Stay: Payer: Medicaid Other

## 2021-02-08 ENCOUNTER — Inpatient Hospital Stay: Payer: Medicaid Other | Admitting: Hematology and Oncology

## 2021-02-08 LAB — BASIC METABOLIC PANEL
Anion gap: 8 (ref 5–15)
BUN: 14 mg/dL (ref 6–20)
CO2: 26 mmol/L (ref 22–32)
Calcium: 9.4 mg/dL (ref 8.9–10.3)
Chloride: 103 mmol/L (ref 98–111)
Creatinine, Ser: 0.5 mg/dL (ref 0.44–1.00)
GFR, Estimated: >60 mL/min (ref >60)
Glucose, Bld: 123 mg/dL — ABNORMAL HIGH (ref 70–99)
Potassium: 4.4 mmol/L (ref 3.5–5.1)
Sodium: 137 mmol/L (ref 135–145)

## 2021-02-08 LAB — GLUCOSE, CAPILLARY
Glucose-Capillary: 145 mg/dL — ABNORMAL HIGH (ref 70–99)
Glucose-Capillary: 144 mg/dL — ABNORMAL HIGH (ref 70–99)
Glucose-Capillary: 154 mg/dL — ABNORMAL HIGH (ref 70–99)

## 2021-02-08 LAB — PHOSPHORUS: Phosphorus: 4.8 mg/dL — ABNORMAL HIGH (ref 2.5–4.6)

## 2021-02-08 LAB — MAGNESIUM: Magnesium: 1.8 mg/dL (ref 1.7–2.4)

## 2021-02-08 MED ORDER — TRAVASOL 10 % IV SOLN: INTRAVENOUS | Status: DC

## 2021-02-08 MED ORDER — HYDROMORPHONE HCL 1 MG/ML PO LIQD
0.5000 mg | ORAL | Status: DC | PRN
  Administered 2021-02-08: 0.5 mg via ORAL
  Filled 2021-02-08: qty 1

## 2021-02-08 MED ORDER — TRAVASOL 10 % IV SOLN
INTRAVENOUS | Status: AC
  Filled 2021-02-08: qty 1056

## 2021-02-08 MED ORDER — HEPARIN SOD (PORK) LOCK FLUSH 100 UNIT/ML IV SOLN
500.0000 [IU] | INTRAVENOUS | Status: DC | PRN
  Filled 2021-02-08: qty 5

## 2021-02-08 MED ORDER — SODIUM CHLORIDE 0.9 % IV SOLN
12.5000 mg | Freq: Once | INTRAVENOUS | Status: DC
  Filled 2021-02-08 (×2): qty 0.5

## 2021-02-08 MED ORDER — HEPARIN SOD (PORK) LOCK FLUSH 100 UNIT/ML IV SOLN: 500.0000 [IU] | INTRAVENOUS | Status: DC

## 2021-02-08 MED ORDER — OXYCODONE HCL 5 MG PO TABS
5.0000 mg | ORAL_TABLET | Freq: Once | ORAL | Status: AC
  Administered 2021-02-09: 5 mg via ORAL
  Filled 2021-02-08: qty 1

## 2021-02-08 MED ORDER — ONDANSETRON 4 MG PO TBDP
4.0000 mg | ORAL_TABLET | Freq: Three times a day (TID) | ORAL | Status: DC | PRN
  Administered 2021-02-08: 4 mg via ORAL
  Filled 2021-02-08: qty 1

## 2021-02-08 MED ORDER — LORAZEPAM 2 MG/ML PO CONC
0.5000 mg | Freq: Four times a day (QID) | ORAL | Status: DC | PRN
  Administered 2021-02-09: 0.5 mg via ORAL
  Filled 2021-02-08: qty 1

## 2021-02-08 MED ORDER — HYDROMORPHONE HCL 1 MG/ML PO LIQD
1.0000 mg | ORAL | Status: DC | PRN
  Administered 2021-02-08 – 2021-02-10 (×6): 1 mg via ORAL
  Filled 2021-02-08 (×6): qty 1

## 2021-02-08 MED ORDER — INSULIN ASPART 100 UNIT/ML IJ SOLN
0.0000 [IU] | INTRAMUSCULAR | Status: DC
Start: 2021-02-08 — End: 2021-02-09
  Administered 2021-02-08: 2 [IU] via SUBCUTANEOUS
  Administered 2021-02-09: 3 [IU] via SUBCUTANEOUS

## 2021-02-08 NOTE — Progress Notes (Addendum)
PHARMACY - TOTAL PARENTERAL NUTRITION CONSULT NOTE   Indication: Fistula  Patient Measurements: Height: 5\' 2"  (157.5 cm) Weight: 96.6 kg (212 lb 15.4 oz) IBW/kg (Calculated) : 50.1 TPN AdjBW (KG): 60.1 Body mass index is 38.95 kg/m. Adj Body Weight 66kg  Recent Labs    02/06/21 0702 02/07/21 0521 02/08/21 0538  NA 138 137 137  K 4.0 4.0 4.4  CL 103 102 103  CO2 27 27 26   GLUCOSE 125* 117* 123*  BUN 7 8 14   CREATININE 0.56 0.47 0.50  CALCIUM 8.8* 9.1 9.4  PHOS 4.4 4.9* 4.8*  MG 1.8 1.8 1.8  ALBUMIN 3.0* 3.2*  --   ALKPHOS 75 77  --   AST 18 21  --   ALT 15 17  --   BILITOT 0.3 0.3  --       Assessment: enterocutaneous fistula, hx of Ovarian Ca  Glucose / Insulin: hx DM? (previous Metformin, not taking at admission), CBGs continue to be at goal < 180, 1 unit SSI past 24h Electrolytes: WNL except phos slightly elevated at 4.9 Renal: SCr 0.47 stable, BUN 8 stable Hepatic: AST/ALT 21/17 stable, alkphos 77 stable Albumin/prealbumin: alb 3.2, prealb 9.6 Intake / Output; MIVF: I/O unreliable with multiple unmeasured UOP; D5 1/2NS with 20 mEq KCL at 70 ml/hr GI Imaging: GI Surgeries / Procedures: 6/22 Lap - enterotomy repair 7/2 I&D of abd abscess  Central access: has PAC TPN start date: 7/4  Nutritional Goals (per RD recommendation on 7/5): 100-115 g protein/day, 1800-2000 kcal/day, 2L/day  Goal (prior to RD consult) TPN rate is 80 mL/hr (provides 105 g of protein and 1977 kcals per day) based on ABW 66kg  Current Nutrition:  NPO  Plan:  At 1800: Begin conversion to cyclic TPN administration over 18 hours as follows: 56 mL/hr x 1 hr, then 113 mL/hr x 16 hr, then 56 mL/hr x 1 hr, then off x 6 hr Electrolytes in TPN: Na 67mEq/L, K 76mEq/L, Ca 63mEq/L, Mg 64mEq/L, and Phos 15mM/L.  Anticipate Phos should continue to fall toward wnl after dosage reduction yesterday.   Cl:Ac 1:1 Add standard MVI and trace elements to TPN Change CBG schedule to: 9 pm (2 hrs after rate  ramp-up and 6 am; also check CBG at 1 pm (one hour after TPN cycles off) but no insulin coverage for that CBG. Continue MIVF at 70 mL/hr at 1800. At discharge, MD is planning to convert to MIVF boluses administered at the start and end of each "on" cycle. BMet, Mg, Phos in AM  Clayburn Pert, PharmD, BCPS 02/08/2021  7:48 AM

## 2021-02-08 NOTE — Progress Notes (Signed)
6 Days Post-Op Procedure(s) (LRB): INCISION AND DRAINAGE ABSCESS (N/A)  Subjective: Patient reports no nausea. Pain at the IV site that is no longer working. She is emptying the eakin pouch on her own with 100 cc of output daily. Reporting intermittent gas pains and pain around the eakin pouch/fistula site. She is having bowel movements and has gas that feels like it come down to the rectum then works it way back up her GI tract. Ambulating without difficulty.    Objective: Vital signs in last 24 hours: Temp:  [98.3 F (36.8 C)-98.7 F (37.1 C)] 98.7 F (37.1 C) (07/08 1349) Pulse Rate:  [83-93] 83 (07/08 1349) Resp:  [16-18] 18 (07/08 1349) BP: (124-166)/(89-110) 133/98 (07/08 1349) SpO2:  [98 %-100 %] 100 % (07/08 1349) Weight:  [212 lb 11.9 oz (96.5 kg)-212 lb 15.4 oz (96.6 kg)] 212 lb 15.4 oz (96.6 kg) (07/08 0609) Last BM Date: 02/07/21  Intake/Output from previous day: 07/07 0701 - 07/08 0700 In: 3857.1 [I.V.:3068.8; IV Piggyback:788.3] Out: 1000 [Urine:900]  Physical Examination: General: alert, cooperative, and no distress Resp: clear to auscultation bilaterally Cardio: regular rate and rhythm, S1, S2 normal, no murmur, click, rub or gallop GI: abdomen obese, eakin pouch draining with pouch seal intact, active bowel sounds, soft, non-tympanic Extremities: extremities normal, atraumatic, no cyanosis or edema Right chest port a cath with TPN running. Dressing intact.  Labs:   BUN/Cr/glu/ALT/AST/amyl/lip:  14/0.50/--/--/--/--/-- (07/08 5993)  Assessment: 45 y.o. with history of clear cell ovarian cancer diagnosed initially in 07/2019 most recently undergoing treatment with gemzar and avastin (although most recent imaging without clear evidence of current disease, normal CA-125) who underwent diagnostic lsc with conversion to open in the setting of significant abdominal adhesions thought to be contributing to intermittent symptoms of partial SBO c/b unavoidable 1cm small bowel  enterotomy with oversew now with enterocutaneous fistula.  45 y.o. s/p Procedure(s): INCISION AND DRAINAGE ABSCESS: stable Pain:  Pain medication switched to oral to begin transitioning to home. Liquid dilaudid ordered.  Heme: Last CBC on 02/05/21 Hgb 9.1 and Hct 28.6.  ID: WBC 10 on 02/05/21 am from 16.1 on 02/04/21. IV antibiotics discontinued today.  CV: BP and HR stable. Continue to monitor.  GI:  Tolerating po: NPO due to enterocutaneous fistula. TPN being administered.  GU: Voiding without difficulty.   FEN: No critical values reported.  Endo: Diabetes Type 2, CBG (last 3)  Recent Labs    02/07/21 2309 02/08/21 0730 02/08/21 1500  GLUCAP 134* 145* 144*     Prophylaxis: Lovenox ordered.  Plan: Cyclic TPN to begin this evening Peripheral IV site removed. No IV restart at this time. Home care arranged with plans for discharge home on Sunday, February 10, 2021  Dispo:  Discharge plan to include :case management The patient is to be discharged to home with home health.   LOS: 5 days    [name] 02/08/2021, 3:41 PM

## 2021-02-08 NOTE — Progress Notes (Signed)
Dilaudid 1 mg dose wasted due to broken syringe. Dose did not reach patient. Witnessed by Wyatt Portela, Therapist, sports.

## 2021-02-09 DIAGNOSIS — K632 Fistula of intestine: Secondary | ICD-10-CM

## 2021-02-09 LAB — BASIC METABOLIC PANEL
Anion gap: 9 (ref 5–15)
BUN: 17 mg/dL (ref 6–20)
CO2: 26 mmol/L (ref 22–32)
Calcium: 9.6 mg/dL (ref 8.9–10.3)
Chloride: 104 mmol/L (ref 98–111)
Creatinine, Ser: 0.52 mg/dL (ref 0.44–1.00)
GFR, Estimated: >60 mL/min (ref >60)
Glucose, Bld: 171 mg/dL — ABNORMAL HIGH (ref 70–99)
Potassium: 4.3 mmol/L (ref 3.5–5.1)
Sodium: 139 mmol/L (ref 135–145)

## 2021-02-09 LAB — CBC
HCT: 33.3 % — ABNORMAL LOW (ref 36.0–46.0)
Hemoglobin: 10.4 g/dL — ABNORMAL LOW (ref 12.0–15.0)
MCH: 27.7 pg (ref 26.0–34.0)
MCHC: 31.2 g/dL (ref 30.0–36.0)
MCV: 88.6 fL (ref 80.0–100.0)
Platelets: 391 10*3/uL (ref 150–400)
RBC: 3.76 MIL/uL — ABNORMAL LOW (ref 3.87–5.11)
RDW: 13.9 % (ref 11.5–15.5)
WBC: 9.3 10*3/uL (ref 4.0–10.5)
nRBC: 0 % (ref 0.0–0.2)

## 2021-02-09 LAB — AEROBIC/ANAEROBIC CULTURE W GRAM STAIN (SURGICAL/DEEP WOUND): Gram Stain: NONE SEEN

## 2021-02-09 LAB — GLUCOSE, CAPILLARY
Glucose-Capillary: 202 mg/dL — ABNORMAL HIGH (ref 70–99)
Glucose-Capillary: 145 mg/dL — ABNORMAL HIGH (ref 70–99)
Glucose-Capillary: 246 mg/dL — ABNORMAL HIGH (ref 70–99)

## 2021-02-09 LAB — MAGNESIUM: Magnesium: 1.8 mg/dL (ref 1.7–2.4)

## 2021-02-09 LAB — PHOSPHORUS: Phosphorus: 4.5 mg/dL (ref 2.5–4.6)

## 2021-02-09 MED ORDER — ACETAMINOPHEN 650 MG RE SUPP: 650.0000 mg | Freq: Four times a day (QID) | RECTAL | 2 refills | Status: AC | PRN

## 2021-02-09 MED ORDER — LORAZEPAM 2 MG/ML PO CONC: 0.6000 mg | Freq: Four times a day (QID) | ORAL | 0 refills | Status: DC | PRN

## 2021-02-09 MED ORDER — HYDROMORPHONE HCL 1 MG/ML PO LIQD: 1.0000 mg | ORAL | 0 refills | Status: DC | PRN

## 2021-02-09 MED ORDER — ONDANSETRON 4 MG PO TBDP: 4.0000 mg | ORAL_TABLET | ORAL | 0 refills | Status: DC | PRN

## 2021-02-09 MED ORDER — OMEPRAZOLE 40 MG PO CPDR: 40.0000 mg | DELAYED_RELEASE_CAPSULE | Freq: Every day | ORAL | 3 refills | Status: DC

## 2021-02-09 MED ORDER — TRAVASOL 10 % IV SOLN
INTRAVENOUS | Status: AC
  Filled 2021-02-09: qty 1056

## 2021-02-09 MED ORDER — INSULIN ASPART 100 UNIT/ML IJ SOLN
0.0000 [IU] | INTRAMUSCULAR | Status: DC
  Administered 2021-02-09 – 2021-02-10 (×2): 5 [IU] via SUBCUTANEOUS

## 2021-02-09 NOTE — Progress Notes (Signed)
PHARMACY - TOTAL PARENTERAL NUTRITION CONSULT NOTE   Indication: EC Fistula  Patient Measurements: Height: 5\' 2"  (157.5 cm) Weight: 96.8 kg (213 lb 6.5 oz) IBW/kg (Calculated) : 50.1 TPN AdjBW (KG): 60.1 Body mass index is 39.03 kg/m. Adj Body Weight 66kg  Recent Labs    02/07/21 0521 02/08/21 0538 02/09/21 0534  NA 137 137 139  K 4.0 4.4 4.3  CL 102 103 104  CO2 27 26 26   GLUCOSE 117* 123* 171*  BUN 8 14 17   CREATININE 0.47 0.50 0.52  CALCIUM 9.1 9.4 9.6  PHOS 4.9* 4.8* 4.5  MG 1.8 1.8 1.8  ALBUMIN 3.2*  --   --   ALKPHOS 77  --   --   AST 21  --   --   ALT 17  --   --   BILITOT 0.3  --   --      Assessment: enterocutaneous fistula, hx of Ovarian Ca  Glucose / Insulin: hx DM? (previous Metformin, not taking at admission) - CBGs mostly at goal; did rise ~200 overnight during TPN cycle, not unexpected given higher infusion rate - 3 units SSI yesterday Electrolytes: stable WNL including Phos (previously elevated) Renal: SCr, bicarb stable WNL; BUN WNL but rising Hepatic: LFTs stable WNL; albumin low but stable ~3.0; TG borderline elevated and prealbumin low on initial labs (not repeated yet) I/O: EC output remains low, </= 100 ml/day (none charted yesterday); UOP appears adequate GI Imaging: n/a GI Surgeries / Procedures:  - 6/22 Lap - enterotomy repair - 7/2 I&D of abd abscess  Central access: has PAC TPN start date: 7/4  Nutritional Goals (per RD recommendation on 7/5): 100-115 g protein/day, 1800-2000 kcal/day, 2L/day  Goal TPN rate is 80 mL/hr (provides 105 g of protein and 1977 kcals per day)  Current Nutrition:  NPO  Plan:  At 9292: Continue cyclic TPN; will decrease cycle to 12 hrs (see new rate instructions on admin comments) in anticipation for discharge tomorrow, 7/10 Electrolytes in TPN: (no changes from yesterday) Na 50 mEq/L K 55 mEq/L Ca 5 mEq/L Mg 5 mEq/L Phos 5 mM/L Cl:Ac 1:1 Add standard MVI and trace elements to TPN CBGs at 9p, 4a,  7a, 4p with 12-hr cycle running from 6p-6a; will increase SSI to moderate scale MIVF per MD (none currently) TPN labs Mondays and Thursdays Phos in AM  Reuel Boom, PharmD, BCPS [phone] 02/09/2021, 9:35 AM

## 2021-02-09 NOTE — Discharge Instructions (Signed)
02/09/2021  Activity: 1. Be up and out of the bed during the day.  Take a nap if needed.  You may walk up steps but be careful and use the hand rail.  Stair climbing will tire you more than you think, you may need to stop part way and rest.   2. No heavy lifting or straining.  3. No driving until you are off narcotics and can brake safely.  4. You can shower normally.    Medications:  - You will have liquid dilaudid to take for pain and tylenol suppositories you can use. You can also take tylenol in pill form if you prefer.  - You will have dissolving Zofran to help with any nausea.  - Small sips with medications is ok, but no large amounts of liquid intake.   Diet: 1. No food by mouth.    Wound Care: 1. Keep clean and dry.  Eakin pouch will be changed every 7-10 days with help of home health.   Reasons to call the Doctor:  Fever - Oral temperature greater than 100.4 degrees Fahrenheit Difficulty urinating Nausea and vomiting Increased pain at the site of the incision that is unrelieved with pain medicine. Difficulty breathing with or without chest pain New calf pain especially if only on one side   Follow-up: 1. See Jeral Pinch in about 3 weeks. We will convert your visit for 7/15 to a phone visit.   Contacts: For questions or concerns you should contact:  Dr. Jeral Pinch at [phone] After hours and on week-ends call [phone] and ask to speak to the physician on call for Gynecologic Oncology

## 2021-02-09 NOTE — Progress Notes (Signed)
     Assessment & Plan: POD#7 - status post INCISION AND DRAINAGE ABSCESS - Dr. Dema Severin 02/02/2021 Enterocutaneous fistula History of ovarian cancer WOCN following Eakin pouch application NPO, TPN Abx off, WBC remains normal today Fistula output remains low at about 100 ml/day HHN, pharmacy working on home TNA arrangements. Plan for discharge tomorrow.     Chief Complaint: Enterocutaneous fistula  Subjective: No acute complaints. Reports pain is adequately controlled. She is looking forward to going home tomorrow. ECF output not charted but patient has been emptying the pouch and reports the output was about 100 ml in the last 24 hours.  Objective: Vital signs in last 24 hours: Temp:  [98.1 F (36.7 C)-99.1 F (37.3 C)] 98.1 F (36.7 C) (07/09 0517) Pulse Rate:  [83-97] 84 (07/09 0517) Resp:  [14-18] 14 (07/09 0517) BP: (126-135)/(90-98) 135/94 (07/09 0517) SpO2:  [97 %-100 %] 97 % (07/09 0517) Weight:  [96.8 kg] 96.8 kg (07/09 0600) Last BM Date: 02/08/21 (small amount)  Intake/Output from previous day: 07/08 0701 - 07/09 0700 In: 975.6 [I.V.:975.6] Out: -  Intake/Output this shift: No intake/output data recorded.  Physical Exam: HEENT - sclerae clear, mucous membranes moist Neck - soft Abdomen - pouch in place, scant amount of succus in bag. Ext - no edema, non-tender Neuro - alert & oriented, no focal deficits  Lab Results:  Recent Labs    02/09/21 0534  WBC 9.3  HGB 10.4*  HCT 33.3*  PLT 391   BMET Recent Labs    02/08/21 0538 02/09/21 0534  NA 137 139  K 4.4 4.3  CL 103 104  CO2 26 26  GLUCOSE 123* 171*  BUN 14 17  CREATININE 0.50 0.52  CALCIUM 9.4 9.6   PT/INR No results for input(s): LABPROT, INR in the last 72 hours. Comprehensive Metabolic Panel:    Component Value Date/Time   NA 139 02/09/2021 0534   NA 137 02/08/2021 0538   K 4.3 02/09/2021 0534   K 4.4 02/08/2021 0538   CL 104 02/09/2021 0534   CL 103 02/08/2021 0538   CO2 26  02/09/2021 0534   CO2 26 02/08/2021 0538   BUN 17 02/09/2021 0534   BUN 14 02/08/2021 0538   CREATININE 0.52 02/09/2021 0534   CREATININE 0.50 02/08/2021 0538   CREATININE 0.68 01/15/2021 0856   CREATININE 0.60 01/11/2021 1157   GLUCOSE 171 (H) 02/09/2021 0534   GLUCOSE 123 (H) 02/08/2021 0538   CALCIUM 9.6 02/09/2021 0534   CALCIUM 9.4 02/08/2021 0538   AST 21 02/07/2021 0521   AST 18 02/06/2021 0702   AST 18 01/15/2021 0856   AST 18 01/11/2021 1157   ALT 17 02/07/2021 0521   ALT 15 02/06/2021 0702   ALT 13 01/15/2021 0856   ALT 14 01/11/2021 1157   ALKPHOS 77 02/07/2021 0521   ALKPHOS 75 02/06/2021 0702   BILITOT 0.3 02/07/2021 0521   BILITOT 0.3 02/06/2021 0702   BILITOT 0.3 01/15/2021 0856   BILITOT 0.4 01/11/2021 1157   PROT 7.0 02/07/2021 0521   PROT 6.6 02/06/2021 0702   ALBUMIN 3.2 (L) 02/07/2021 0521   ALBUMIN 3.0 (L) 02/06/2021 0702    Studies/Results: No results found.    [name] 02/09/2021   Patient ID: Sarah Newman, female   DOB: 22-Jan-1976, 45 y.o.   MRN: [id number]

## 2021-02-09 NOTE — Progress Notes (Signed)
7 Days Post-Op Procedure(s) (LRB): INCISION AND DRAINAGE ABSCESS (N/A)  Subjective: Patient reports doing well. Had headache overnight that made sleeping difficult, improved but still present. Denies any nausea. Improving pain around EC fistula. + flatus and BM yesterday. Ambulated some without difficulty. Very excited for dc tomorrow (son's birthday is tomorrow)  Objective: Vital signs in last 24 hours: Temp:  [98.1 F (36.7 C)-99.1 F (37.3 C)] 98.7 F (37.1 C) (07/09 1323) Pulse Rate:  [84-97] 94 (07/09 1323) Resp:  [14-20] 20 (07/09 1323) BP: (126-135)/(90-95) 135/95 (07/09 1323) SpO2:  [97 %-99 %] 97 % (07/09 1323) Weight:  [213 lb 6.5 oz (96.8 kg)] 213 lb 6.5 oz (96.8 kg) (07/09 0600) Last BM Date: 02/08/21  Intake/Output from previous day: 07/08 0701 - 07/09 0700 In: 975.6 [I.V.:975.6] Out: -   Physical Examination: Gen: NAD CV: regular rate and rhythm, no murmurs, rubs or gallops Pulm: clear to ausculation bilaterally, no wheezes or rhonchi Abd: soft, nontender to palpation around incision. Eakin pouch in place, minimal green thin fluid c/w succus in pouch Ext: warm and well perfused, no edema, no calf tenderness to palpation   Labs: CBC    Component Value Date/Time   WBC 9.3 02/09/2021 0534   RBC 3.76 (L) 02/09/2021 0534   HGB 10.4 (L) 02/09/2021 0534   HGB 10.3 (L) 01/15/2021 0856   HCT 33.3 (L) 02/09/2021 0534   PLT 391 02/09/2021 0534   PLT 560 (H) 01/15/2021 0856   MCV 88.6 02/09/2021 0534   MCH 27.7 02/09/2021 0534   MCHC 31.2 02/09/2021 0534   RDW 13.9 02/09/2021 0534   LYMPHSABS 1.6 02/05/2021 0430   MONOABS 0.6 02/05/2021 0430   EOSABS 0.3 02/05/2021 0430   BASOSABS 0.0 02/05/2021 0430   CMP Latest Ref Rng & Units 02/09/2021 02/08/2021 02/07/2021  Glucose 70 - 99 mg/dL 171(H) 123(H) 117(H)  BUN 6 - 20 mg/dL 17 14 8   Creatinine 0.44 - 1.00 mg/dL 0.52 0.50 0.47  Sodium 135 - 145 mmol/L 139 137 137  Potassium 3.5 - 5.1 mmol/L 4.3 4.4 4.0  Chloride 98 -  111 mmol/L 104 103 102  CO2 22 - 32 mmol/L 26 26 27   Calcium 8.9 - 10.3 mg/dL 9.6 9.4 9.1  Total Protein 6.5 - 8.1 g/dL - - 7.0  Total Bilirubin 0.3 - 1.2 mg/dL - - 0.3  Alkaline Phos 38 - 126 U/L - - 77  AST 15 - 41 U/L - - 21  ALT 0 - 44 U/L - - 17   Assessment:  45 y.o. with history of clear cell ovarian cancer diagnosed initially in 07/2019 most recently undergoing treatment with gemzar and avastin (although most recent imaging without clear evidence of current disease, normal CA-125) who underwent diagnostic lsc with conversion to open in the setting of significant abdominal adhesions thought to be contributing to intermittent symptoms of partial SBO c/b unavoidable 1cm small bowel enterotomy with oversew now with enterocutaneous fistula.   Continued low output (approx 100cc/day). Patient doing well on TPN. TPN being cycled - plan per pharmacy is to try running it over 12 hours this evening. TPN and HH set up for discharge tomorrow.   Antibiotics discontinued yesterday - remains afebrile over 24 hours, normal WBC this am.   Discussed in detail medications she will have available for use at home. Will discuss with pharmacy need to dc on insulin.   Plan: - continue TPN/IVFs, NPO for bowel rest - Eakin pouch to incision, close monitoring of fistula output -  up and out of bed, lovenox for DVT prophylaxis while in the hospital   The patient is to be discharged to home. Has home health in place, plan for TPN at home.   LOS: 6 days    Sarah Newman 02/09/2021, 2:28 PM

## 2021-02-10 LAB — CBC
HCT: 34.3 % — ABNORMAL LOW (ref 36.0–46.0)
Hemoglobin: 10.6 g/dL — ABNORMAL LOW (ref 12.0–15.0)
MCH: 27.3 pg (ref 26.0–34.0)
MCHC: 30.9 g/dL (ref 30.0–36.0)
MCV: 88.4 fL (ref 80.0–100.0)
Platelets: 419 10*3/uL — ABNORMAL HIGH (ref 150–400)
RBC: 3.88 MIL/uL (ref 3.87–5.11)
RDW: 14.1 % (ref 11.5–15.5)
WBC: 9.4 10*3/uL (ref 4.0–10.5)
nRBC: 0 % (ref 0.0–0.2)

## 2021-02-10 LAB — BASIC METABOLIC PANEL
Anion gap: 8 (ref 5–15)
BUN: 19 mg/dL (ref 6–20)
CO2: 25 mmol/L (ref 22–32)
Calcium: 9.7 mg/dL (ref 8.9–10.3)
Chloride: 105 mmol/L (ref 98–111)
Creatinine, Ser: 0.49 mg/dL (ref 0.44–1.00)
GFR, Estimated: >60 mL/min (ref >60)
Glucose, Bld: 169 mg/dL — ABNORMAL HIGH (ref 70–99)
Potassium: 4.6 mmol/L (ref 3.5–5.1)
Sodium: 138 mmol/L (ref 135–145)

## 2021-02-10 LAB — GLUCOSE, CAPILLARY
Glucose-Capillary: 226 mg/dL — ABNORMAL HIGH (ref 70–99)
Glucose-Capillary: 178 mg/dL — ABNORMAL HIGH (ref 70–99)

## 2021-02-10 LAB — MAGNESIUM: Magnesium: 1.8 mg/dL (ref 1.7–2.4)

## 2021-02-10 LAB — PHOSPHORUS: Phosphorus: 2.9 mg/dL (ref 2.5–4.6)

## 2021-02-10 MED ORDER — INSULIN ASPART 100 UNIT/ML IJ SOLN: 0.0000 [IU] | Freq: Four times a day (QID) | INTRAMUSCULAR | 11 refills | Status: DC

## 2021-02-10 MED ORDER — ACCU-CHEK SOFTCLIX LANCETS MISC: 12 refills | Status: DC

## 2021-02-10 MED ORDER — GLUCOSE BLOOD VI STRP: ORAL_STRIP | 12 refills | Status: DC

## 2021-02-10 MED ORDER — ACCU-CHEK AVIVA CONNECT W/DEVICE KIT: 1.0000 | PACK | Freq: Once | 0 refills | Status: AC

## 2021-02-10 NOTE — Progress Notes (Signed)
PHARMACY - TOTAL PARENTERAL NUTRITION CONSULT NOTE   Discharge planned for today with TPN to continue after discharge per [facility] stable WNL (although Phos decreased) CBGs elevated during TPN cycle - discussed options with GynOnc; will discharge with SSI with possible transition to insulin in TPN (per [facility])  No further inpatient TPN  Reuel Boom, PharmD, BCPS [phone] 02/10/2021, 8:58 AM

## 2021-02-10 NOTE — Discharge Summary (Signed)
Physician Discharge Summary  Patient ID: Sarah Newman MRN: [id number] DOB/AGE: Feb 25, 1976 45 y.o.  Admit date: 02/02/2021 Discharge date: 02/10/2021  Admission Diagnoses: Enterocutaneous fistula versus postoperative superficial wound infection  Discharge Diagnoses:  Principal Problem:   Enterocutaneous fistula Active Problems:   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HCC)   Abscess   Postoperative abscess   Discharged Condition:  The patient is in good condition and stable for discharge.   Hospital Course: The patient initially presented on post-operative day #10 from a diagnostic lsc converted to open with attempt of lysis of adhesions and unavoidable enterotomy with repair with symptoms of abdominal pain and nausea. She was found to have a leukocytosis and on imaging had findings concerning for either an evolving EC fistula vs. Subcutaneous wound infection. She was taken to the OR on 7/2 with general surgery (Dr. Dema Severin) with incision and drainage of phlegmon within the subcutaneous tissue, with no definitive evidence of EC fistula. The wound was packed and within 24 hours had output that was consistent with succus, confirming an EC fistula. The patient was initially started on Zosyn and then narrowed to cefepime and flagyl when abdominal wound cultures resulted. She was treated with 7 days of antibiotics, which were discontinued on 7/8. Once her EC fistula was confirmed, wound care was consulted and an Eakin pouch was applied to her wound. Output of her fistula was carefully monitored and recorded as being approximately 100cc/day. She was made NPO on 7/3 and started on TPN, which was cycled down to 12 hours by the day of discharge. She was discharged to home with home health to continue TPN (NPO) and wound care at home. ON the day of discharge, she was reporting feeling well without significant pain. She denied any nausea or emesis. She was reporting similar output from her EC fistula. She  endorsed flatus and BM. She was voiding without difficulty. She is discharged to home in stable condition with plan for bowel rest to aide with fistula closure.  Consults: General surgery, wound care  Significant Diagnostic Studies:  CBC    Component Value Date/Time   WBC 9.4 02/10/2021 0658   RBC 3.88 02/10/2021 0658   HGB 10.6 (L) 02/10/2021 0658   HGB 10.3 (L) 01/15/2021 0856   HCT 34.3 (L) 02/10/2021 0658   PLT 419 (H) 02/10/2021 0658   PLT 560 (H) 01/15/2021 0856   MCV 88.4 02/10/2021 0658   MCH 27.3 02/10/2021 0658   MCHC 30.9 02/10/2021 0658   RDW 14.1 02/10/2021 0658   LYMPHSABS 1.6 02/05/2021 0430   MONOABS 0.6 02/05/2021 0430   EOSABS 0.3 02/05/2021 0430   BASOSABS 0.0 02/05/2021 0430    CMP Latest Ref Rng & Units 02/10/2021 02/09/2021 02/08/2021  Glucose 70 - 99 mg/dL 169(H) 171(H) 123(H)  BUN 6 - 20 mg/dL '19 17 14  ' Creatinine 0.44 - 1.00 mg/dL 0.49 0.52 0.50  Sodium 135 - 145 mmol/L 138 139 137  Potassium 3.5 - 5.1 mmol/L 4.6 4.3 4.4  Chloride 98 - 111 mmol/L 105 104 103  CO2 22 - 32 mmol/L '25 26 26  ' Calcium 8.9 - 10.3 mg/dL 9.7 9.6 9.4  Total Protein 6.5 - 8.1 g/dL - - -  Total Bilirubin 0.3 - 1.2 mg/dL - - -  Alkaline Phos 38 - 126 U/L - - -  AST 15 - 41 U/L - - -  ALT 0 - 44 U/L - - -   CT A/P on 7/3 with PO contrast: 1. Examination is positive  for enterocutaneous fistula from the small bowel with enteric contrast extending into the ill-defined air fluid collection of the left anterior abdominal wall. The degree of soft tissue air has increased from CT yesterday, and enteric contrast is clearly demonstrated within the ill-defined collection extending to the skin surface to the left of the umbilicus. 2. No free intra-abdominal air. Previously described tiny gas bubbles in the preperitoneal space in the midline likely were within small bowel, there is no free intra-abdominal air. 3. Increasing linear atelectasis in both lower lobes. Trace right pleural  effusion.  Treatments: IV antibiotics (Zosyn --> cefepime + flagyl), IV hydration, TPN  Discharge Exam: Blood pressure (!) 139/96, pulse 93, temperature 98.1 F (36.7 C), temperature source Oral, resp. rate 16, height '5\' 2"'  (1.575 m), weight 193 lb 2 oz (87.6 kg), last menstrual period 07/05/2019, SpO2 100 %. Gen: NAD CV: regular rate and rhythm, no murmurs, rubs or gallops Pulm: clear to ausculation bilaterally, no wheezes or rhonchi Abd: soft, nontender to palpation around incision. Eakin pouch in place, approximately 30cc of green thin fluid c/w succus in pouch Ext: warm and well perfused, no edema, no calf tenderness to palpation  Disposition: Discharge disposition: 06-Home-Health Care Svc       Discharge Instructions     Call MD for:   Complete by: As directed    Call MD for:  difficulty breathing, headache or visual disturbances   Complete by: As directed    Call MD for:  extreme fatigue   Complete by: As directed    Call MD for:  hives   Complete by: As directed    Call MD for:  persistant dizziness or light-headedness   Complete by: As directed    Call MD for:  persistant nausea and vomiting   Complete by: As directed    Call MD for:  redness, tenderness, or signs of infection (pain, swelling, redness, odor or green/yellow discharge around incision site)   Complete by: As directed    Call MD for:  severe uncontrolled pain   Complete by: As directed    Call MD for:  temperature >100.4   Complete by: As directed    Discharge wound care:   Complete by: As directed    Wound care instructions provided by wound care/ostomy team. Please dc patient with cut out of size for Eakin pouch. Please dc patient with printed instructions for wound care from wound care nurse.   Increase activity slowly   Complete by: As directed    Nursing communication   Complete by: As directed    Please re access port prior to discharge. Patient to be discharged with port accessed, dressed for  TPN use at home.      Allergies as of 02/10/2021       Reactions   Carboplatin Shortness Of Breath        Medication List     STOP taking these medications    acetaminophen 500 MG tablet Commonly known as: TYLENOL Replaced by: acetaminophen 650 MG suppository   metFORMIN 500 MG tablet Commonly known as: GLUCOPHAGE   ondansetron 8 MG tablet Commonly known as: ZOFRAN   oxyCODONE 5 MG immediate release tablet Commonly known as: Oxy IR/ROXICODONE       TAKE these medications    Accu-Chek Aviva Connect w/Device Kit 1 each by Does not apply route once for 1 dose.   Accu-Chek Softclix Lancets lancets Use as instructed   acetaminophen 650 MG suppository Commonly known as: TYLENOL  Place 1 suppository (650 mg total) rectally every 6 (six) hours as needed for moderate pain. Replaces: acetaminophen 500 MG tablet   glucose blood test strip Use as instructed   HYDROmorphone HCl 1 MG/ML Liqd Commonly known as: DILAUDID Take 1 mL (1 mg total) by mouth every 4 (four) hours as needed for severe pain or moderate pain.   insulin aspart 100 UNIT/ML injection Commonly known as: novoLOG Inject 0-15 Units into the skin 4 (four) times daily. Correction coverage: Moderate (average weight, post-op) CBG < 70: Implement Hypoglycemia Standing Orders and refer to Hypoglycemia Standing Orders sidebar report CBG 70 - 120: 0 units CBG 121 - 150: 2 units CBG 151 - 200: 3 units CBG 201 - 250: 5 units CBG 251 - 300: 8 units CBG 301 - 350: 11 units CBG 351 - 400: 15 units   lidocaine-prilocaine cream Commonly known as: EMLA APPLY TO THE AFFECTED AREA AS DIRECTED TO ACCESS PORT   LORazepam 2 MG/ML concentrated solution Commonly known as: ATIVAN Take 0.3 mLs (0.6 mg total) by mouth every 6 (six) hours as needed for up to 20 doses for anxiety or sleep.   omeprazole 40 MG capsule Commonly known as: PRILOSEC Take 1 capsule (40 mg total) by mouth daily.   ondansetron 4 MG  disintegrating tablet Commonly known as: Zofran ODT Take 1 tablet (4 mg total) by mouth every 4 (four) hours as needed for up to 20 doses for nausea or vomiting.               Discharge Care Instructions  (From admission, onward)           Start     Ordered   02/10/21 0000  Discharge wound care:       Comments: Wound care instructions provided by wound care/ostomy team. Please dc patient with cut out of size for Eakin pouch. Please dc patient with printed instructions for wound care from wound care nurse.   02/10/21 0733            Follow-up Information     [facility] Follow up.   Why: For home health RN Contact information: [address] [place] [address] [phone]         [facility] Follow up.   Why: For home infusion pharmacy [phone].        Ileana Roup, MD. Schedule an appointment as soon as possible for a visit in 2 week(s).   Specialties: General Surgery, Colon and Rectal Surgery Why: Our office should be working on follow up appointment. Please call to confirm date/time. Please arrive 30 min prior to appointment time. Contact information: [place] [address] [phone]                 Greater than thirty minutes were spend for face to face discharge instructions and discharge orders/summary in EPIC.   Signed: Lafonda Mosses 02/10/2021, 7:35 AM

## 2021-02-11 ENCOUNTER — Telehealth: Payer: Self-pay | Admitting: *Deleted

## 2021-02-11 ENCOUNTER — Telehealth: Payer: Self-pay

## 2021-02-11 NOTE — Telephone Encounter (Signed)
Per Dr Berline Lopes changed the appt for this Friday to a phone visit and scheduled in per son appt for 8/1. Patient aware

## 2021-02-11 NOTE — Telephone Encounter (Signed)
Sarah Newman states that she is doing well. The [facility] nurse reviewed the TPN machine with her today.  She is afebrile. No BM today. Her BP today was at 111/90.  The EC fistula out put was 75 cc for the last 24 hr period. The fluid is brown colored now from green. She is testing BS and has used the sliding scale insulin dosing. She paid out of pocket for the [facility] BS monitoring  machine and supplies. She paid ~$80.00. She also paid out of pocket for the dilaudid 1 mg/ml liquid.  Fortunately a coupon was used and she only paid $4.00 for 30 cc. Told her that the dilaudid  and BS monitoring machine needed prior authorization from insurance. The prior authorization was sent for the dilaudid today. Her Pain level around the EC fistula is a 7/10.  She states that is is fine with this pain level.  She does not want to use the dilaudid to slow her bowel down.  Told her she could try 0.5 mg and see if she gets her pain level down to below 5. And more relaxed as she noted she was when using dilaudid in the hospital. She states that she is doing better every day.She has been up and around today and outside some.  Her BP today was at 111/90.  She will try the ativan to see if it can help her sleep. The last day in the hospital she said it gave her restless legs. Told her it seems as though she is doing well managing her care. She knows to call the office if she has any questions or concerns

## 2021-02-12 ENCOUNTER — Encounter: Payer: Self-pay | Admitting: Gynecologic Oncology

## 2021-02-14 ENCOUNTER — Inpatient Hospital Stay: Payer: Medicaid Other | Attending: Gynecologic Oncology

## 2021-02-14 ENCOUNTER — Other Ambulatory Visit: Payer: Self-pay | Admitting: Gynecologic Oncology

## 2021-02-14 ENCOUNTER — Other Ambulatory Visit (HOSPITAL_COMMUNITY): Payer: Self-pay

## 2021-02-14 ENCOUNTER — Inpatient Hospital Stay (HOSPITAL_BASED_OUTPATIENT_CLINIC_OR_DEPARTMENT_OTHER): Payer: Medicaid Other | Admitting: Gynecologic Oncology

## 2021-02-14 ENCOUNTER — Telehealth: Payer: Self-pay | Admitting: Oncology

## 2021-02-14 ENCOUNTER — Other Ambulatory Visit: Payer: Self-pay

## 2021-02-14 DIAGNOSIS — B3731 Acute candidiasis of vulva and vagina: Secondary | ICD-10-CM

## 2021-02-14 DIAGNOSIS — B373 Candidiasis of vulva and vagina: Secondary | ICD-10-CM

## 2021-02-14 DIAGNOSIS — R110 Nausea: Secondary | ICD-10-CM

## 2021-02-14 DIAGNOSIS — C562 Malignant neoplasm of left ovary: Secondary | ICD-10-CM

## 2021-02-14 DIAGNOSIS — C563 Malignant neoplasm of bilateral ovaries: Secondary | ICD-10-CM | POA: Diagnosis present

## 2021-02-14 DIAGNOSIS — K632 Fistula of intestine: Secondary | ICD-10-CM

## 2021-02-14 DIAGNOSIS — Z9889 Other specified postprocedural states: Secondary | ICD-10-CM

## 2021-02-14 LAB — CMP (CANCER CENTER ONLY)
ALT: 8 U/L (ref 0–44)
AST: 17 U/L (ref 15–41)
Albumin: 3.2 g/dL — ABNORMAL LOW (ref 3.5–5.0)
Alkaline Phosphatase: 81 U/L (ref 38–126)
Anion gap: 9 (ref 5–15)
BUN: 16 mg/dL (ref 6–20)
CO2: 26 mmol/L (ref 22–32)
Calcium: 9.3 mg/dL (ref 8.9–10.3)
Chloride: 104 mmol/L (ref 98–111)
Creatinine: 0.67 mg/dL (ref 0.44–1.00)
GFR, Estimated: >60 mL/min (ref >60)
Glucose, Bld: 161 mg/dL — ABNORMAL HIGH (ref 70–99)
Potassium: 4.3 mmol/L (ref 3.5–5.1)
Sodium: 139 mmol/L (ref 135–145)
Total Bilirubin: <0.2 mg/dL — ABNORMAL LOW (ref 0.3–1.2)
Total Protein: 7.7 g/dL (ref 6.5–8.1)

## 2021-02-14 LAB — CBC WITH DIFFERENTIAL (CANCER CENTER ONLY)
Abs Immature Granulocytes: 0.01 10*3/uL (ref 0.00–0.07)
Basophils Absolute: 0 10*3/uL (ref 0.0–0.1)
Basophils Relative: 0 %
Eosinophils Absolute: 0.2 10*3/uL (ref 0.0–0.5)
Eosinophils Relative: 3 %
HCT: 31.6 % — ABNORMAL LOW (ref 36.0–46.0)
Hemoglobin: 10.1 g/dL — ABNORMAL LOW (ref 12.0–15.0)
Immature Granulocytes: 0 %
Lymphocytes Relative: 19 %
Lymphs Abs: 1.4 10*3/uL (ref 0.7–4.0)
MCH: 27.6 pg (ref 26.0–34.0)
MCHC: 32 g/dL (ref 30.0–36.0)
MCV: 86.3 fL (ref 80.0–100.0)
Monocytes Absolute: 0.4 10*3/uL (ref 0.1–1.0)
Monocytes Relative: 5 %
Neutro Abs: 5.3 10*3/uL (ref 1.7–7.7)
Neutrophils Relative %: 73 %
Platelet Count: 356 10*3/uL (ref 150–400)
RBC: 3.66 MIL/uL — ABNORMAL LOW (ref 3.87–5.11)
RDW: 14.1 % (ref 11.5–15.5)
WBC Count: 7.4 10*3/uL (ref 4.0–10.5)
nRBC: 0 % (ref 0.0–0.2)

## 2021-02-14 MED ORDER — SODIUM CHLORIDE 0.9% FLUSH
10.0000 mL | Freq: Once | INTRAVENOUS | Status: AC
  Administered 2021-02-14: 10 mL
  Filled 2021-02-14: qty 10

## 2021-02-14 MED ORDER — HEPARIN SOD (PORK) LOCK FLUSH 100 UNIT/ML IV SOLN
500.0000 [IU] | Freq: Once | INTRAVENOUS | Status: AC
Start: 2021-02-14 — End: 2021-02-14
  Administered 2021-02-14: 500 [IU]
  Filled 2021-02-14: qty 5

## 2021-02-14 MED ORDER — PROMETHAZINE HCL 12.5 MG RE SUPP: 12.5000 mg | Freq: Three times a day (TID) | RECTAL | 0 refills | Status: DC | PRN

## 2021-02-14 MED ORDER — FLUCONAZOLE 150 MG PO TABS
150.0000 mg | ORAL_TABLET | Freq: Once | ORAL | 0 refills | Status: AC
Start: 2021-02-14 — End: 2021-02-15
  Filled 2021-02-14: qty 1, 1d supply, fill #0

## 2021-02-14 NOTE — Telephone Encounter (Signed)
Called Reigna back and advised her of stable lab results.  She also said she is worried about swallowing the diflucan pill on an empty stomach.  Advised her to try over the counter cream (monistat) instead.  She verbalized understanding and agreement.

## 2021-02-14 NOTE — Progress Notes (Signed)
Gynecologic Oncology Telehealth Consult Note: Gyn-Onc  I connected with Sarah Newman on 02/14/21 at  2:00 PM EDT by telephone and verified that I am speaking with the correct person using two identifiers.  I discussed the limitations, risks, security and privacy concerns of performing an evaluation and management service by telemedicine and the availability of in-person appointments. I also discussed with the patient that there may be a patient responsible charge related to this service. The patient expressed understanding and agreed to proceed.  Other persons participating in the visit and their role in the encounter: None.  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 There is a 16 cm complex cystic mass in the pelvis containing thickened septations located near midline, abutting the superior aspect of the uterus. I suspect this mass is adnexal/ovarian in origin rather than uterine in origin. Specifically, I suspect the mass likely arises from the left ovary/adnexa and is most consistent with a neoplasm. Malignancy is certainly not excluded on this study. Recommend a pelvic ultrasound for further evaluation. 2. The rounded more solid-appearing structure in the right side of the pelvis is probably the right ovary. Recommend attention on ultrasound. 3. The small amount of fluid in the pelvis is likely reactive to the complex cystic mass likely rising from the left ovary/adnexa. 4. No cause for blood in vomit or black stools identified. No convincing evidence of a perforated or inflamed gastric or duodenal ulcer. 5. Hepatic steatosis.   07/02/2019 Imaging   MRI pelvis 1. There is a large (15 cm) solid and cystic mass  which appears to be arising from the left ovary concerning for cystic ovarian neoplasm. There are 2 enhancing nodules within the central upper abdomen raising the possibility of peritoneal carcinomatosis. Additionally, there is a small amount fluid within the pelvis. Malignant ascites not excluded. 2. Fibroid uterus. 3. Hepatic steatosis.   07/02/2019 Imaging   US pelvis 1. There is a large mass in the pelvis also seen on CT imaging. A separate left ovary is not visualized. 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 The mass is suspicious for malignancy. Recommend gynecologic consultation. 2. The right ovary demonstrates an irregular ill-defined hypoechoic hypervascular region centrally. The findings are nonspecific in the right ovary. However, given the apparent left ovarian neoplasm suspicious for malignancy, recommend an MRI to further assess the right ovary. 3. Uterine fibroid measuring 3.6 cm.     07/21/2019 Pathology Results   FINAL MICROSCOPIC DIAGNOSIS: A. 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 - See oncology table. B. OMENTUM, RESECTION: - Omental lymph node with metastatic carcinoma (1/1). - Omental adipose tissue with foci of inflammation and reactive mesothelial changes. C. FALLOPIAN TUBE, LEFT, RESECTION: - Fallopian tube with focal, mild inflammation and fibrosis. - No tumor identified. D. UTERUS, CERVIX, RIGHT TUBE AND OVARY: - Cervix Nabothian cyst and squamous metaplasia. - Endometrium Proliferative. No hyperplasia or carcinoma. - Myometrium Leiomyomata. No malignancy. -Right ovary Poorly differentiated carcinoma, 4.8 cm. See oncology table and comment. -Right Fallopian tube Benign paratubal cyst. No endometriosis or malignancy. ONCOLOGY TABLE: OVARY or FALLOPIAN TUBE or PRIMARY PERITONEUM: Procedure: Bilateral f-oophorectomy, hysterectomy and omentectomy. Specimen Integrity:  Intact Tumor Site: Left ovary and right  ovary. See comment. Ovarian Surface Involvement: Not identified. Fallopian Tube Surface Involvement: Not identified. Tumor Size: Left ovary: 18.6 x 16.0 x 7.2 cm. Right ovary: 4.8 x 3.2 x 3.2 cm. Histologic Type: Clear cell carcinoma. See comment. Histologic Grade: High-grade. Other Tissue/ Organ Involvement: Omental lymph node with metastatic carcinoma. Peritoneal/Ascitic Fluid: Pending. Treatment Effect: No known presurgical therapy. Pathologic Stage Classification (pTNM, AJCC 8th Edition): pT3a, pN1a. Representative Tumor Block: A2, A3, A4, A5 A6, A7 and A8. Comment(s): The left ovarian tumor is 18.6 cm in greatest dimension and has features of clear cell carcinoma. The right ovarian tumor is 4.8 cm in greatest dimension and is poorly differentiated with focal features consistent with clear cell carcinoma. The pattern of involvement suggests that the right ovarian tumor is likely metastatic from the larger left ovarian clear cell carcinoma. Sections of the omentum show a lymph node with metastatic carcinoma. Sections of the remainder of the omentum do not show involvement by carcinoma.   07/21/2019 Surgery   Procedure(s) Performed: Exploratory laparotomy with radical tumor debulking including total hysterectomy, bilateral salpingo-oophorectomy, infra-gastric omentectomy, and washings.   Surgeon: Jeral Pinch, MD    Operative Findings: On EUA, mobile uterus and ovarian mass, situated out of the pelvis, spanning 6-8 cm above the umbilicus.  On intra-abdominal entry, inflamed omentum adherent to much of the 16 cm left ovarian mass, adhesions easily lysed bluntly.  No nodularity of the ovarian mass however on delivery of the mass through the midline incision, there was Intra-Op rupture of one of the smaller cystic components with drainage of clear brown-tinged fluid.  Grossly normal-appearing left fallopian tube as well as right tube.  Right ovary mildly  enlarged measuring approximately 4 cm and firm/nodular, suspicious for tumor involvement.  Uterus 8-10 cm with 4 cm anterior mid body fibroid.  Some very small volume miliary disease along right pelvic sidewall, removed with uterine specimen.  Evidence of small diverticular disease.  Small bowel normal appearing although multiple loops of bowel adherent to each other, especially by mesentery, and an inflammatory manner.  An approximately 2 x 2 centimeter nodule suspicious for tumor implant was found in the omentum just inferior to the greater curvature of the stomach.  Additional small (less than 5 mm) implants noted on the stomach and posterior aspect of the left lobe of the liver.  Otherwise the liver surface and diaphragm were smooth.   08/03/2019 Cancer Staging   Staging form: Ovary, Fallopian Tube, and Primary Peritoneal Carcinoma, AJCC 8th Edition - Pathologic: Stage IIIA2 (pT3a, pN1, cM0) - Signed by Heath Lark, MD on 08/03/2019    08/12/2019 Procedure   Placement of single lumen port a cath via right internal jugular vein. The catheter tip lies at the cavo-atrial junction. A power injectable port a cath was placed and is ready for immediate use.   08/22/2019 -  Chemotherapy   The patient had carboplatin and taxol for chemotherapy treatment.     09/23/2019 Genetic Testing   Negative genetic testing:  No pathogenic variants detected on the [facility] TumorNext-HRD+CancerNext paired germline and somatic genetic test. The report date is 09/23/2019.  The TumorNext-HRD+CancerNext test offered by [facility] includes paired germline and tumor analyses of 11 genes associated with homologous recombination repair (ATM, BARD1, BRIP1, CHEK2, MRE11A, NBN, PALB2, RAD51C, RAD51D, BRCA1, BRCA2) plus germline analyses of 26 additional genes associated with hereditary cancer (APC, AXIN2, BMPR1A, CDH1, CDK4, CDKN2A, DICER1, HOXB13, EPCAM, GREM1, MLH1, MSH2, MSH3, MSH6, MUTYH, NF1, NTHL1, PMS2, POLD1, POLE, PTEN, RECQL,  SMAD4,  SMARCA4, STK11, and TP53).   11/14/2019 Tumor Marker   Patient's tumor was tested for the following markers: CA-125 Results of the tumor marker test revealed 10.2.   12/05/2019 Tumor Marker   Patient's tumor was tested for the following markers: CA-125 Results of the tumor marker test revealed 9.9   01/05/2020 Imaging   No specific findings of active malignancy. Interval resection of the pelvic mass. Subtle nodularity along the right side of the vaginal cuff merits surveillance.     01/05/2020 Tumor Marker   Patient's tumor was tested for the following markers: CA-125 Results of the tumor marker test revealed 9.7   02/17/2020 Tumor Marker   Patient's tumor was tested for the following markers: CA-125 Results of the tumor marker test revealed 9.6   04/05/2020 Imaging   1. There is some minimal stranding along the mesentery and omentum without overt omental caking or well-defined nodularity. Faint bandlike density along the anterior peritoneal reflection in the pelvis, slightly more notable than on prior. Overall the appearance is not considered specific for recurrence but given the slight increase in prominence from 01/05/2020, would encourage careful surveillance by tumor markers and imaging. 2. Mild lower lumbar degenerative facet arthropathy.   04/05/2020 Tumor Marker   Patient's tumor was tested for the following markers: CA-125 Results of the tumor marker test revealed 11   05/29/2020 Tumor Marker   Patient's tumor was tested for the following markers: CA-125. Results of the tumor marker test revealed 17.   07/04/2020 Tumor Marker   Patient's tumor was tested for the following markers: CA-125. Results of the tumor marker test revealed 20.1   07/16/2020 Imaging   Stable mild stranding throughout mesenteric and omental fat, without evidence of discrete masses or ascites. No evidence of new or progressive disease.   08/29/2020 Tumor Marker   Patient's tumor was tested for the  following markers: CA-125 Results of the tumor marker test revealed 32.7.   09/07/2020 Imaging   1. Interval progression of peritoneal disease within the abdomen and pelvis. Tumor predominantly involves the serosal surface of the large and small bowel loops. No signs of bowel obstruction identified at this time. 2. No evidence for metastatic disease to the chest.   09/19/2020 Imaging   1. No acute findings identified within the abdomen or pelvis. No evidence for bowel obstruction. 2. Similar appearance of soft tissue thickening along the peritoneal reflections within the abdomen and pelvis compatible with known peritoneal disease. Recently characterized increased soft tissue along the surface of the jejunal bowel loops and tethering of pelvic small bowel loops is difficult to quantify (and compared with previous exam) due to lack of IV contrast material.     09/20/2020 -  Chemotherapy    Patient is on Treatment Plan: OVARIAN RECURRENT 3RD LINE CARBOPLATIN D1 / GEMCITABINE D1,8 (4/800) Q21D  Carboplatin was discontinued after 10/15/2020 due to allergic reaction      10/01/2020 Tumor Marker   Patient's tumor was tested for the following markers: CA-125. Results of the tumor marker test revealed 37.2.   10/15/2020 Tumor Marker   Patient's tumor was tested for the following markers: CA-125. Results of the tumor marker test revealed 48   10/22/2020 Tumor Marker   Patient's tumor was tested for the following markers: CA-125 Results of the tumor marker test revealed 28.5   11/05/2020 Tumor Marker   Patient's tumor was tested for the following markers: CA-125 Results of the tumor marker test revealed 18.9   12/17/2020 Imaging  1. No significant change in soft tissue thickening and nodularity in the low pelvis, with minimal thickening of the peritoneum throughout the abdomen. 2. Similar appearance of soft tissue thickening about the mid small bowel in the central, ventral abdomen. 3. Findings are  consistent with stable peritoneal metastatic disease. 4. Status post hysterectomy and cholecystectomy.   12/18/2020 Tumor Marker   Patient's tumor was tested for the following markers: CA-125 Results of the tumor marker test revealed 10.8   01/11/2021 Imaging   Findings concerning for small bowel obstruction.     01/11/2021 Tumor Marker   Patient's tumor was tested for the following markers: CA-125. Results of the tumor marker test revealed 10.4.   01/11/2021 Imaging   CT abdomen and pelvis Mildly prominent proximal loops of small bowel although no true transition zone is seen in the distal small bowel appears within normal limits. These changes could represent early partial small bowel obstruction. The overall appearance however is similar to that seen on prior CT from May of 2022.   Mild peritoneal thickening similar to that seen on the prior exam.   Fatty liver.   No other focal abnormality is noted.     Interval History: The patient reports that overall things have been going well at home.  She has been on TPN has been feeling hungry and hoping that she will be able to eat soon.  She has some nausea, which the Zofran helps to control during the day.  She denies having relief with Zofran when she takes it at night during TPN.  She continues to have some symptoms related to hyperglycemia overnight with TPN.  She has been checking her sugars multiple times overnight.  She reports that the output from her fistula has decreased by about half and is now approximately 50 cc per 24 hours.  She is ambulating without difficulty.  She reports flatus, has not had a bowel movement since leaving the hospital.  Denies any fevers or chills.  She has not really taken Dilaudid or Tylenol for pain since discharge.  Past Medical/Surgical History: Past Medical History:  Diagnosis Date   Anemia due to antineoplastic chemotherapy    Arthritis    Asthma due to seasonal allergies    01-21-2021 per pt  seasonal and exercised induced, does not have inhaler, last used one few years   Chronic abdominal pain 01/2021   due to partial bowel obstruction   Chronic constipation 01/2021   due to partial bowel obstruction   Chronic nausea    01-21-2021  per pt intermittant nausea and when has abd pain most of time will have vomiting   Family history of breast cancer    Family history of colon cancer    Family history of ovarian cancer    Family history of prostate cancer    History of 2019 novel coronavirus disease (COVID-19) 09/08/2020   positive results in epic, per pt mild symptoms that resolved   History of Helicobacter pylori infection 07/2016   E[facility] w/ bx's by [facility],  chronic h.pylori w/ gastritis,  treated  (no ulcer)   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;  progression w/ abdominal carcinomatosis   Partial bowel obstruction ([place]) 01/2021   Steroid-induced diabetes ([place])    followed by dr Alvy Bimler    Past Surgical History:  Procedure Laterality Date   CHOLECYSTECTOMY N/A 02/16/2015  Procedure: LAPAROSCOPIC CHOLECYSTECTOMY WITH INTRAOPERATIVE CHOLANGIOGRAM;  Surgeon: Johnathan Hausen, MD;  Location: [facility];  Service: General;  Laterality: N/A;   INCISION AND DRAINAGE ABSCESS N/A 02/02/2021   Procedure: INCISION AND DRAINAGE ABSCESS;  Surgeon: Lahoma Crocker, MD;  Location: [facility];  Service: Gynecology;  Laterality: N/A;   IR IMAGING GUIDED PORT INSERTION  08/12/2019   LAPAROSCOPY N/A 01/23/2021   Procedure: LAPAROSCOPY DIAGNOSTIC;  Surgeon: Lafonda Mosses, MD;  Location[facility];  Service: Gynecology;  Laterality: N/A;   LAPAROTOMY N/A 01/23/2021   Procedure: LAPAROTOMY, ENTEROTOMY REPAIR OF THE BOWEL;  Surgeon: Lafonda Mosses, MD;  Location: [facility];  Service: Gynecology;  Laterality: N/A;    SALPINGOOPHORECTOMY N/A 07/21/2019   Procedure: EXPLORATORY LAPAROTOMY, TOTAL ABDOMINAL HYSTERECTOMY, BILATERAL SALPINGO OOPHORECTOMY, OMENTECTOMY;  Surgeon: Lafonda Mosses, MD;  Location: [facility];  Service: Gynecology;  Laterality: N/A;   UPPER GASTROINTESTINAL ENDOSCOPY  08/01/2016   by dr Corinda Gubler in epic    Family History  Problem Relation Age of Onset   Colon cancer Father 34   Prostate cancer Father 35   Colon cancer Paternal Aunt        dx. early 40s   Ovarian cancer Paternal Grandmother 55   Breast cancer Cousin    Endometriosis Mother    Cancer Paternal Uncle        unknown type, dx. early 5s   Cancer Paternal Uncle 50       unknown type   Esophageal cancer Neg Hx     Social History   Socioeconomic History   Marital status: Married    Spouse name: Herbie [place]   Number of children: 2   Years of education: Not on file   Highest education level: Not on file  Occupational History   Not on file  Tobacco Use   Smoking status: Never   Smokeless tobacco: Never  Vaping Use   Vaping Use: Never used  Substance and Sexual Activity   Alcohol use: Yes    Comment: Social drinker   Drug use: Never   Sexual activity: Yes    Birth control/protection: Surgical  Other Topics Concern   Not on file  Social History Narrative   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  Newly married as of October 2020.   2 boys, age 13 and 59   Social Determinants of Health   Emergency planning/management officer Strain: Not on file  Food Insecurity: Not on file  Transportation Needs: Not on file  Physical Activity: Not on file  Stress: Not on file  Social Connections: Not on file    Current Medications:  Current Outpatient Medications:    Accu-Chek Softclix Lancets lancets, Use as instructed, Disp: 100 each, Rfl: 12   acetaminophen (TYLENOL) 650 MG suppository, Place 1 suppository (650 mg total) rectally every 6 (six) hours as needed for moderate pain., Disp: 15 suppository, Rfl: 2   fluconazole  (DIFLUCAN) 150 MG tablet, Take 1 tablet (150 mg total) by mouth once for 1 dose., Disp: 1 tablet, Rfl: 0   glucose blood test strip, Use as instructed, Disp: 100 each, Rfl: 12   HYDROmorphone HCl (DILAUDID) 1 MG/ML LIQD, Take 1 mL (1 mg total) by mouth every 4 (four) hours as needed for severe pain or moderate pain., Disp: 30 mL, Rfl: 0   insulin aspart (NOVOLOG) 100 UNIT/ML injection, Inject 0-15 Units into the skin 4 (four) times daily. Correction coverage: Moderate (average weight, post-op) CBG < 70:  Implement Hypoglycemia Standing Orders and refer to Hypoglycemia Standing Orders sidebar report CBG 70 - 120: 0 units CBG 121 - 150: 2 units CBG 151 - 200: 3 units CBG 201 - 250: 5 units CBG 251 - 300: 8 units CBG 301 - 350: 11 units CBG 351 - 400: 15 units, Disp: 60 mL, Rfl: 11   lidocaine-prilocaine (EMLA) cream, APPLY TO THE AFFECTED AREA AS DIRECTED TO ACCESS PORT, Disp: 30 g, Rfl: 11   LORazepam (ATIVAN) 2 MG/ML concentrated solution, Take 0.3 mLs (0.6 mg total) by mouth every 6 (six) hours as needed for up to 20 doses for anxiety or sleep., Disp: 6 mL, Rfl: 0   omeprazole (PRILOSEC) 40 MG capsule, Take 1 capsule (40 mg total) by mouth daily., Disp: 30 capsule, Rfl: 3   ondansetron (ZOFRAN ODT) 4 MG disintegrating tablet, Take 1 tablet (4 mg total) by mouth every 4 (four) hours as needed for up to 20 doses for nausea or vomiting., Disp: 20 tablet, Rfl: 0  Review of Symptoms: Pertinent positives as per HPI  Physical Exam: LMP 07/05/2019  Deferred given limitations of phone visit  Laboratory & Radiologic Studies: CBC    Component Value Date/Time   WBC 7.4 02/14/2021 1212   WBC 9.4 02/10/2021 0658   RBC 3.66 (L) 02/14/2021 1212   HGB 10.1 (L) 02/14/2021 1212   HCT 31.6 (L) 02/14/2021 1212   PLT 356 02/14/2021 1212   MCV 86.3 02/14/2021 1212   MCH 27.6 02/14/2021 1212   MCHC 32.0 02/14/2021 1212   RDW 14.1 02/14/2021 1212   LYMPHSABS 1.4 02/14/2021 1212   MONOABS 0.4 02/14/2021 1212    EOSABS 0.2 02/14/2021 1212   BASOSABS 0.0 02/14/2021 1212   CMP Latest Ref Rng & Units 02/14/2021 02/10/2021 02/09/2021  Glucose 70 - 99 mg/dL 161(H) 169(H) 171(H)  BUN 6 - 20 mg/dL '16 19 17  ' Creatinine 0.44 - 1.00 mg/dL 0.67 0.49 0.52  Sodium 135 - 145 mmol/L 139 138 139  Potassium 3.5 - 5.1 mmol/L 4.3 4.6 4.3  Chloride 98 - 111 mmol/L 104 105 104  CO2 22 - 32 mmol/L '26 25 26  ' Calcium 8.9 - 10.3 mg/dL 9.3 9.7 9.6  Total Protein 6.5 - 8.1 g/dL 7.7 - -  Total Bilirubin 0.3 - 1.2 mg/dL <0.2(L) - -  Alkaline Phos 38 - 126 U/L 81 - -  AST 15 - 41 U/L 17 - -  ALT 0 - 44 U/L 8 - -   Assessment & Plan: Sarah Newman is a 45 y.o. 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  Patient was discharged home this past Sunday after little over a week in the hospital that involved wound management for her EC fistula and TPN.  She seems to be doing well at home with significant decrease already in her EC fistula output.  We discussed the importance of continuing to remain n.p.o. as this will hopefully allow her bowels to rest and recover so that her fistula closes.  We have discussed the issue of improved glycemic control with home health and TPN provider.  Only we will be able to add some insulin to the TPN to decrease her glucose overnight and to decrease the amount of insulin that  she has to give herself.  With regard to her vaginal symptoms consistent with a yeast infection, Diflucan pill was sent into the pharmacy earlier today.  I recommended that we use topical treatment instead given her n.p.o. status.  Labs from yesterday drawn by home health were erroneous, noting a hemoglobin of  5.9%.  I suspect that this was related to how they were drawn off of her port.  I offered that we could instead plan to have the patient come in every Thursday at noon for labs.  This was her preference.  Given nausea at night that is not treated well with Zofran, I offered that we could try Phenergan suppository.  A new prescription was sent to her pharmacy for this.  She and I will have a phone visit next Friday.  She is aware to call with any questions or concerns before then.  I discussed the assessment and treatment plan with the patient. The patient was provided with an opportunity to ask questions and all were answered. The patient agreed with the plan and demonstrated an understanding of the instructions.   The patient was advised to call back or see an in-person evaluation if the symptoms worsen or if the condition fails to improve as anticipated.   26 minutes of total time was spent for this patient encounter, including preparation, face-to-face counseling with the patient and coordination of care, and documentation of the encounter.   Jeral Pinch, MD  Division of Gynecologic Oncology  Department of Obstetrics and Gynecology  [facility]

## 2021-02-14 NOTE — Telephone Encounter (Signed)
Notified Sarah Newman that we have sent the prescription to the outpatient pharmacy.  Also asked if she would like an in person visit or a phone visit with Dr. Berline Lopes today. She said she would like a phone visit.  She is feeling well - just tired from having to get up during the night to check her blood sugar.

## 2021-02-14 NOTE — Telephone Encounter (Signed)
Sarah Newman and discussed that we had received a call that her hgb was low and that Dr. Berline Lopes would like her to come to the cancer center to have her labs drawn.  [place] said she can come in at 12:00 today.  Apt scheduled.  She also requested a prescription for diflucan to be sent to the [facility].  She reports having some irritation in her vaginal area.

## 2021-02-14 NOTE — Addendum Note (Signed)
Addended by: Elmo Putt R on: 02/14/2021 09:15 AM   Modules accepted: Orders

## 2021-02-14 NOTE — Telephone Encounter (Signed)
[place] and spoke to Goodman with case managament.  Advised her of Senaida's HGB result from today which is 10.1. Discussed with her that their low result was most likely from the labs being drawn from the patient's port right after TPN infusion was stopped and that the sample could have been diluted.  She verbalized agreement.  Also discussed that we will be drawing her labs at the cancer center for the next 3 weeks on Thursdays.  She said they will continue drawing their labs for the TPN on Mondays as they will be drawing additional tests.

## 2021-02-15 ENCOUNTER — Inpatient Hospital Stay: Payer: Medicaid Other | Admitting: Gynecologic Oncology

## 2021-02-20 ENCOUNTER — Telehealth: Payer: Self-pay

## 2021-02-20 NOTE — Telephone Encounter (Signed)
Cristy said that Ms Joselyn Arrow said that she is having increased nausea and some vomiting when the TPN bag is nearly completed. Patient feels that the 2100 ml volume is too much. The pharmacy has the pump ramping down over 2 hours for completion of TPN instead of 1 hour to help with symptoms. Ms Muenchow does not use the nausea medication available to her either. Reviewed with Joylene John, NP. Asa Lente that she needs to call Apolonio Schneiders the TPN pharmacist at [phone] to discuss adjusting the TPN volumeper Joylene John, NP.

## 2021-02-21 ENCOUNTER — Inpatient Hospital Stay: Payer: Medicaid Other

## 2021-02-21 ENCOUNTER — Telehealth: Payer: Self-pay | Admitting: Oncology

## 2021-02-21 NOTE — Telephone Encounter (Signed)
Sarah Newman regarding her lab appointment today.  She said she didn't think she needed it since her home health agency drew labs on Monday.  She is going to have her nurse send [place] the results to see if they need to be repeated at the [facility].

## 2021-02-22 ENCOUNTER — Inpatient Hospital Stay (HOSPITAL_BASED_OUTPATIENT_CLINIC_OR_DEPARTMENT_OTHER): Payer: Medicaid Other | Admitting: Gynecologic Oncology

## 2021-02-22 ENCOUNTER — Telehealth: Payer: Self-pay | Admitting: Oncology

## 2021-02-22 ENCOUNTER — Encounter: Payer: Self-pay | Admitting: Hematology and Oncology

## 2021-02-22 DIAGNOSIS — R110 Nausea: Secondary | ICD-10-CM

## 2021-02-22 DIAGNOSIS — K632 Fistula of intestine: Secondary | ICD-10-CM

## 2021-02-22 MED ORDER — ONDANSETRON 4 MG PO TBDP: 4.0000 mg | ORAL_TABLET | ORAL | 0 refills | Status: DC | PRN

## 2021-02-22 NOTE — Progress Notes (Signed)
Gynecologic Oncology Telehealth Consult Note: Gyn-Onc  I connected with Sarah Newman on 02/22/21 at  3:45 PM EDT by telephone and verified that I am speaking with the correct person using two identifiers.  I discussed the limitations, risks, security and privacy concerns of performing an evaluation and management service by telemedicine and the availability of in-person appointments. I also discussed with the patient that there may be a patient responsible charge related to this service. The patient expressed understanding and agreed to proceed.  Other persons participating in the visit and their role in the encounter: None.  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 There is a 16 cm complex cystic mass in the pelvis containing thickened septations located near midline, abutting the superior aspect of the uterus. I suspect this mass is adnexal/ovarian in origin rather than uterine in origin. Specifically, I suspect the mass likely arises from the left ovary/adnexa and is most consistent with a neoplasm. Malignancy is certainly not excluded on this study. Recommend a pelvic ultrasound for further evaluation. 2. The rounded more solid-appearing structure in the right side of the pelvis is probably the right ovary. Recommend attention on ultrasound. 3. The small amount of fluid in the pelvis is likely reactive to the complex cystic mass likely rising from the left ovary/adnexa. 4. No cause for blood in vomit or black stools identified. No convincing evidence of a perforated or inflamed gastric or duodenal ulcer. 5. Hepatic steatosis.   07/02/2019 Imaging   MRI pelvis 1. There is a large (15 cm) solid  and cystic mass which appears to be arising from the left ovary concerning for cystic ovarian neoplasm. There are 2 enhancing nodules within the central upper abdomen raising the possibility of peritoneal carcinomatosis. Additionally, there is a small amount fluid within the pelvis. Malignant ascites not excluded. 2. Fibroid uterus. 3. Hepatic steatosis.   07/02/2019 Imaging   US pelvis 1. There is a large mass in the pelvis also seen on CT imaging. A separate left ovary is not visualized. 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 The mass is suspicious for malignancy. Recommend gynecologic consultation. 2. The right ovary demonstrates an irregular ill-defined hypoechoic hypervascular region centrally. The findings are nonspecific in the right ovary. However, given the apparent left ovarian neoplasm suspicious for malignancy, recommend an MRI to further assess the right ovary. 3. Uterine fibroid measuring 3.6 cm.     07/21/2019 Pathology Results   FINAL MICROSCOPIC DIAGNOSIS: A. 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 - See oncology table. B. OMENTUM, RESECTION: - Omental lymph node with metastatic carcinoma (1/1). - Omental adipose tissue with foci of inflammation and reactive mesothelial changes. C. FALLOPIAN TUBE, LEFT, RESECTION: - Fallopian tube with focal, mild inflammation and fibrosis. - No tumor identified. D. UTERUS, CERVIX, RIGHT TUBE AND OVARY: - Cervix Nabothian cyst and squamous metaplasia. - Endometrium Proliferative. No hyperplasia or carcinoma. - Myometrium Leiomyomata. No malignancy. -Right ovary Poorly differentiated carcinoma, 4.8 cm. See oncology table and comment. -Right Fallopian tube Benign paratubal cyst. No endometriosis or malignancy. ONCOLOGY TABLE: OVARY or FALLOPIAN TUBE or PRIMARY PERITONEUM: Procedure: Bilateral f-oophorectomy, hysterectomy and omentectomy. Specimen  Integrity: Intact Tumor Site: Left  ovary and right ovary. See comment. Ovarian Surface Involvement: Not identified. Fallopian Tube Surface Involvement: Not identified. Tumor Size: Left ovary: 18.6 x 16.0 x 7.2 cm. Right ovary: 4.8 x 3.2 x 3.2 cm. Histologic Type: Clear cell carcinoma. See comment. Histologic Grade: High-grade. Other Tissue/ Organ Involvement: Omental lymph node with metastatic carcinoma. Peritoneal/Ascitic Fluid: Pending. Treatment Effect: No known presurgical therapy. Pathologic Stage Classification (pTNM, AJCC 8th Edition): pT3a, pN1a. Representative Tumor Block: A2, A3, A4, A5 A6, A7 and A8. Comment(s): The left ovarian tumor is 18.6 cm in greatest dimension and has features of clear cell carcinoma. The right ovarian tumor is 4.8 cm in greatest dimension and is poorly differentiated with focal features consistent with clear cell carcinoma. The pattern of involvement suggests that the right ovarian tumor is likely metastatic from the larger left ovarian clear cell carcinoma. Sections of the omentum show a lymph node with metastatic carcinoma. Sections of the remainder of the omentum do not show involvement by carcinoma.   07/21/2019 Surgery   Procedure(s) Performed: Exploratory laparotomy with radical tumor debulking including total hysterectomy, bilateral salpingo-oophorectomy, infra-gastric omentectomy, and washings.   Surgeon: Jeral Pinch, MD    Operative Findings: On EUA, mobile uterus and ovarian mass, situated out of the pelvis, spanning 6-8 cm above the umbilicus.  On intra-abdominal entry, inflamed omentum adherent to much of the 16 cm left ovarian mass, adhesions easily lysed bluntly.  No nodularity of the ovarian mass however on delivery of the mass through the midline incision, there was Intra-Op rupture of one of the smaller cystic components with drainage of clear brown-tinged fluid.  Grossly normal-appearing left fallopian tube as well as right tube.  Right  ovary mildly enlarged measuring approximately 4 cm and firm/nodular, suspicious for tumor involvement.  Uterus 8-10 cm with 4 cm anterior mid body fibroid.  Some very small volume miliary disease along right pelvic sidewall, removed with uterine specimen.  Evidence of small diverticular disease.  Small bowel normal appearing although multiple loops of bowel adherent to each other, especially by mesentery, and an inflammatory manner.  An approximately 2 x 2 centimeter nodule suspicious for tumor implant was found in the omentum just inferior to the greater curvature of the stomach.  Additional small (less than 5 mm) implants noted on the stomach and posterior aspect of the left lobe of the liver.  Otherwise the liver surface and diaphragm were smooth.   08/03/2019 Cancer Staging   Staging form: Ovary, Fallopian Tube, and Primary Peritoneal Carcinoma, AJCC 8th Edition - Pathologic: Stage IIIA2 (pT3a, pN1, cM0) - Signed by Heath Lark, MD on 08/03/2019    08/12/2019 Procedure   Placement of single lumen port a cath via right internal jugular vein. The catheter tip lies at the cavo-atrial junction. A power injectable port a cath was placed and is ready for immediate use.   08/22/2019 -  Chemotherapy   The patient had carboplatin and taxol for chemotherapy treatment.     09/23/2019 Genetic Testing   Negative genetic testing:  No pathogenic variants detected on the [facility] TumorNext-HRD+CancerNext paired germline and somatic genetic test. The report date is 09/23/2019.  The TumorNext-HRD+CancerNext test offered by [facility] includes paired germline and tumor analyses of 11 genes associated with homologous recombination repair (ATM, BARD1, BRIP1, CHEK2, MRE11A, NBN, PALB2, RAD51C, RAD51D, BRCA1, BRCA2) plus germline analyses of 26 additional genes associated with hereditary cancer (APC, AXIN2, BMPR1A, CDH1, CDK4, CDKN2A, DICER1, HOXB13, EPCAM, GREM1, MLH1, MSH2, MSH3, MSH6, MUTYH, NF1, NTHL1, PMS2, POLD1, POLE, PTEN,  RECQL, SMAD4, SMARCA4, STK11, and TP53).   11/14/2019 Tumor Marker   Patient's tumor was tested for the following markers: CA-125 Results of the tumor marker test revealed 10.2.   12/05/2019 Tumor Marker   Patient's tumor was tested for the following markers: CA-125 Results of the tumor marker test revealed 9.9   01/05/2020 Imaging   No specific findings of active malignancy. Interval resection of the pelvic mass. Subtle nodularity along the right side of the vaginal cuff merits surveillance.     01/05/2020 Tumor Marker   Patient's tumor was tested for the following markers: CA-125 Results of the tumor marker test revealed 9.7   02/17/2020 Tumor Marker   Patient's tumor was tested for the following markers: CA-125 Results of the tumor marker test revealed 9.6   04/05/2020 Imaging   1. There is some minimal stranding along the mesentery and omentum without overt omental caking or well-defined nodularity. Faint bandlike density along the anterior peritoneal reflection in the pelvis, slightly more notable than on prior. Overall the appearance is not considered specific for recurrence but given the slight increase in prominence from 01/05/2020, would encourage careful surveillance by tumor markers and imaging. 2. Mild lower lumbar degenerative facet arthropathy.   04/05/2020 Tumor Marker   Patient's tumor was tested for the following markers: CA-125 Results of the tumor marker test revealed 11   05/29/2020 Tumor Marker   Patient's tumor was tested for the following markers: CA-125. Results of the tumor marker test revealed 17.   07/04/2020 Tumor Marker   Patient's tumor was tested for the following markers: CA-125. Results of the tumor marker test revealed 20.1   07/16/2020 Imaging   Stable mild stranding throughout mesenteric and omental fat, without evidence of discrete masses or ascites. No evidence of new or progressive disease.   08/29/2020 Tumor Marker   Patient's tumor was tested for  the following markers: CA-125 Results of the tumor marker test revealed 32.7.   09/07/2020 Imaging   1. Interval progression of peritoneal disease within the abdomen and pelvis. Tumor predominantly involves the serosal surface of the large and small bowel loops. No signs of bowel obstruction identified at this time. 2. No evidence for metastatic disease to the chest.   09/19/2020 Imaging   1. No acute findings identified within the abdomen or pelvis. No evidence for bowel obstruction. 2. Similar appearance of soft tissue thickening along the peritoneal reflections within the abdomen and pelvis compatible with known peritoneal disease. Recently characterized increased soft tissue along the surface of the jejunal bowel loops and tethering of pelvic small bowel loops is difficult to quantify (and compared with previous exam) due to lack of IV contrast material.     09/20/2020 -  Chemotherapy    Patient is on Treatment Plan: OVARIAN RECURRENT 3RD LINE CARBOPLATIN D1 / GEMCITABINE D1,8 (4/800) Q21D  Carboplatin was discontinued after 10/15/2020 due to allergic reaction      10/01/2020 Tumor Marker   Patient's tumor was tested for the following markers: CA-125. Results of the tumor marker test revealed 37.2.   10/15/2020 Tumor Marker   Patient's tumor was tested for the following markers: CA-125. Results of the tumor marker test revealed 48   10/22/2020 Tumor Marker   Patient's tumor was tested for the following markers: CA-125 Results of the tumor marker test revealed 28.5   11/05/2020 Tumor Marker   Patient's tumor was tested for the following markers: CA-125 Results of the tumor marker test revealed 18.9   12/17/2020 Imaging  1. No significant change in soft tissue thickening and nodularity in the low pelvis, with minimal thickening of the peritoneum throughout the abdomen. 2. Similar appearance of soft tissue thickening about the mid small bowel in the central, ventral abdomen. 3. Findings  are consistent with stable peritoneal metastatic disease. 4. Status post hysterectomy and cholecystectomy.   12/18/2020 Tumor Marker   Patient's tumor was tested for the following markers: CA-125 Results of the tumor marker test revealed 10.8   01/11/2021 Imaging   Findings concerning for small bowel obstruction.     01/11/2021 Tumor Marker   Patient's tumor was tested for the following markers: CA-125. Results of the tumor marker test revealed 10.4.   01/11/2021 Imaging   CT abdomen and pelvis Mildly prominent proximal loops of small bowel although no true transition zone is seen in the distal small bowel appears within normal limits. These changes could represent early partial small bowel obstruction. The overall appearance however is similar to that seen on prior CT from May of 2022.   Mild peritoneal thickening similar to that seen on the prior exam.   Fatty liver.   No other focal abnormality is noted.     Interval History: Patient reports overall doing well.  She was having some nausea and emesis that was thought to be related to TPN administration.  She is now having the TPN ramped up over the first hour and ramp down the last 2 hours.  She has not had any emesis today.  She continues to have intermittent nausea and has relief from Zofran as well as the Phenergan suppositories.  She denies any fevers or chills.  She has been checking her blood sugars and they have all been less than 200.  She noted some minimal drainage into her bag several days ago but has not had any active draining the last 3 days.  She changed the bag yesterday and notes just a smear on it today.  Past Medical/Surgical History: Past Medical History:  Diagnosis Date   Anemia due to antineoplastic chemotherapy    Arthritis    Asthma due to seasonal allergies    01-21-2021 per pt seasonal and exercised induced, does not have inhaler, last used one few years   Chronic abdominal pain 01/2021   due to partial  bowel obstruction   Chronic constipation 01/2021   due to partial bowel obstruction   Chronic nausea    01-21-2021  per pt intermittant nausea and when has abd pain most of time will have vomiting   Family history of breast cancer    Family history of colon cancer    Family history of ovarian cancer    Family history of prostate cancer    History of 2019 novel coronavirus disease (COVID-19) 09/08/2020   positive results in epic, per pt mild symptoms that resolved   History of Helicobacter pylori infection 07/2016   EGD w/ bx's by [facility],  chronic h.pylori w/ gastritis,  treated  (no ulcer)   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;  progression w/ abdominal carcinomatosis   Partial bowel obstruction ([place] [place]) 01/2021   Steroid-induced diabetes ([place])    followed by dr Alvy Bimler    Past Surgical History:  Procedure Laterality Date   CHOLECYSTECTOMY N/A 02/16/2015   Procedure: LAPAROSCOPIC CHOLECYSTECTOMY WITH INTRAOPERATIVE CHOLANGIOGRAM;  Surgeon: Johnathan Hausen, MD;  Location: [facility];  Service:  General;  Laterality: N/A;   INCISION AND DRAINAGE ABSCESS N/A 02/02/2021   Procedure: INCISION AND DRAINAGE ABSCESS;  Surgeon: Lahoma Crocker, MD;  Location: [facility];  Service: Gynecology;  Laterality: N/A;   IR IMAGING GUIDED PORT INSERTION  08/12/2019   LAPAROSCOPY N/A 01/23/2021   Procedure: LAPAROSCOPY DIAGNOSTIC;  Surgeon: Lafonda Mosses, MD;  Location: [facility];  Service: Gynecology;  Laterality: N/A;   LAPAROTOMY N/A 01/23/2021   Procedure: LAPAROTOMY, ENTEROTOMY REPAIR OF THE BOWEL;  Surgeon: Lafonda Mosses, MD;  Location: [facility];  Service: Gynecology;  Laterality: N/A;   SALPINGOOPHORECTOMY N/A 07/21/2019   Procedure: EXPLORATORY LAPAROTOMY, TOTAL ABDOMINAL HYSTERECTOMY, BILATERAL SALPINGO OOPHORECTOMY,  OMENTECTOMY;  Surgeon: Lafonda Mosses, MD;  Location: [facility];  Service: Gynecology;  Laterality: N/A;   UPPER GASTROINTESTINAL ENDOSCOPY  08/01/2016   by dr Corinda Gubler in epic    Family History  Problem Relation Age of Onset   Colon cancer Father 47   Prostate cancer Father 99   Colon cancer Paternal Aunt        dx. early 58s   Ovarian cancer Paternal Grandmother 40   Breast cancer Cousin    Endometriosis Mother    Cancer Paternal Uncle        unknown type, dx. early 41s   Cancer Paternal Uncle 85       unknown type   Esophageal cancer Neg Hx     Social History   Socioeconomic History   Marital status: Married    Spouse name: Herbie [place]   Number of children: 2   Years of education: Not on file   Highest education level: Not on file  Occupational History   Not on file  Tobacco Use   Smoking status: Never   Smokeless tobacco: Never  Vaping Use   Vaping Use: Never used  Substance and Sexual Activity   Alcohol use: Yes    Comment: Social drinker   Drug use: Never   Sexual activity: Yes    Birth control/protection: Surgical  Other Topics Concern   Not on file  Social History Narrative   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  Newly married as of October 2020.   2 boys, age 12 and 77   Social Determinants of Health   Emergency planning/management officer Strain: Not on file  Food Insecurity: Not on file  Transportation Needs: Not on file  Physical Activity: Not on file  Stress: Not on file  Social Connections: Not on file    Current Medications:  Current Outpatient Medications:    Accu-Chek Softclix Lancets lancets, Use as instructed, Disp: 100 each, Rfl: 12   acetaminophen (TYLENOL) 650 MG suppository, Place 1 suppository (650 mg total) rectally every 6 (six) hours as needed for moderate pain., Disp: 15 suppository, Rfl: 2   glucose blood test strip, Use as instructed, Disp: 100 each, Rfl: 12   HYDROmorphone HCl (DILAUDID) 1 MG/ML LIQD, Take 1 mL (1 mg total) by mouth  every 4 (four) hours as needed for severe pain or moderate pain., Disp: 30 mL, Rfl: 0   insulin aspart (NOVOLOG) 100 UNIT/ML injection, Inject 0-15 Units into the skin 4 (four) times daily. Correction coverage: Moderate (average weight, post-op) CBG < 70: Implement Hypoglycemia Standing Orders and refer to Hypoglycemia Standing Orders sidebar report CBG 70 - 120: 0 units CBG 121 - 150: 2 units CBG 151 - 200: 3 units CBG 201 - 250: 5 units CBG 251 - 300: 8  units CBG 301 - 350: 11 units CBG 351 - 400: 15 units, Disp: 60 mL, Rfl: 11   lidocaine-prilocaine (EMLA) cream, APPLY TO THE AFFECTED AREA AS DIRECTED TO ACCESS PORT, Disp: 30 g, Rfl: 11   LORazepam (ATIVAN) 2 MG/ML concentrated solution, Take 0.3 mLs (0.6 mg total) by mouth every 6 (six) hours as needed for up to 20 doses for anxiety or sleep., Disp: 6 mL, Rfl: 0   omeprazole (PRILOSEC) 40 MG capsule, Take 1 capsule (40 mg total) by mouth daily., Disp: 30 capsule, Rfl: 3   ondansetron (ZOFRAN ODT) 4 MG disintegrating tablet, Take 1 tablet (4 mg total) by mouth every 4 (four) hours as needed for up to 20 doses for nausea or vomiting., Disp: 20 tablet, Rfl: 0   promethazine (PHENERGAN) 12.5 MG suppository, Place 1 suppository (12.5 mg total) rectally every 8 (eight) hours as needed for up to 30 doses for nausea or vomiting., Disp: 30 each, Rfl: 0  Review of Symptoms: Pertinent positives as per HPI.  Physical Exam: LMP 07/05/2019  Deferred given limitations of phone visit  Laboratory & Radiologic Studies: Outside blood work from 03/16/23: WBC 4.8, RBC 3.72, hemoglobin 10.1, hematocrit 31.2, platelets 276.  Prealbumin 15.  Albumin 3.7.  Electrolytes normal.  Assessment & Plan: Sarah Newman is a 45 y.o. woman with history of ovarian cancer most recently who developed an enterocutaneous fistula likely in the setting of recent a Avastin after diagnostic laparoscopy with conversion to laparotomy in the setting of significant intra-abdominal  adhesions leading to symptoms of partial small bowel obstruction that was complicated by an unavoidable enterotomy with repair.  The patient developed an EC fistula which has been managed with bowel rest and TPN.  Sarah Newman is doing well and denies any drainage from her EC fistula the last few days.  She has had some difficulty tolerating administration of TPN but this is improving.  I discussed plan for continued bowel rest over the next week or so.  I have ordered a CT scan to be done either next Thursday or Friday and I will see her in person on Friday.  If CT confirms no persistent evidence of fistula and no bowel obstruction, then we will work on decreasing TPN and starting a regular diet.  I discussed the assessment and treatment plan with the patient. The patient was provided with an opportunity to ask questions and all were answered. The patient agreed with the plan and demonstrated an understanding of the instructions.   The patient was advised to call back or see an in-person evaluation if the symptoms worsen or if the condition fails to improve as anticipated.   22 minutes of total time was spent for this patient encounter, including preparation, face-to-face counseling with the patient and coordination of care, and documentation of the encounter.   Jeral Pinch, MD  Division of Gynecologic Oncology  Department of Obstetrics and Gynecology  [facility]

## 2021-02-22 NOTE — Telephone Encounter (Signed)
Called Sarah Newman back and advised her she does not need to have her labs drawn today.  Dr. Berline Lopes has reviewed her labs from home health.  Also reviewed her upcoming appointments for next week.

## 2021-02-22 NOTE — Telephone Encounter (Signed)
Called Sarah Newman and advised her of CT scan apt on 02/28/21 at 3:30.  She said she would like to drink the water based contrast so advised her to arrive at 1:30.  Also rescheduled her apt with Dr. Berline Lopes on 7/29 to 1:30.

## 2021-02-25 ENCOUNTER — Telehealth: Payer: Self-pay

## 2021-02-25 NOTE — Telephone Encounter (Signed)
Left message for [name] with [facility] regarding her message re: [name] getting labs drawn today. Per Dr. Berline Lopes, patient does not need labs today since she will be getting them Thursday prior to her CT. Instructed to call with any questions.

## 2021-02-27 ENCOUNTER — Encounter: Payer: Self-pay | Admitting: Oncology

## 2021-02-27 ENCOUNTER — Other Ambulatory Visit: Payer: Self-pay

## 2021-02-27 ENCOUNTER — Inpatient Hospital Stay: Payer: Medicaid Other

## 2021-02-27 DIAGNOSIS — C563 Malignant neoplasm of bilateral ovaries: Secondary | ICD-10-CM | POA: Diagnosis not present

## 2021-02-27 DIAGNOSIS — C562 Malignant neoplasm of left ovary: Secondary | ICD-10-CM

## 2021-02-27 LAB — CBC WITH DIFFERENTIAL (CANCER CENTER ONLY)
Abs Immature Granulocytes: 0.01 10*3/uL (ref 0.00–0.07)
Basophils Absolute: 0 10*3/uL (ref 0.0–0.1)
Basophils Relative: 0 %
Eosinophils Absolute: 0.2 10*3/uL (ref 0.0–0.5)
Eosinophils Relative: 3 %
HCT: 32.9 % — ABNORMAL LOW (ref 36.0–46.0)
Hemoglobin: 10.7 g/dL — ABNORMAL LOW (ref 12.0–15.0)
Immature Granulocytes: 0 %
Lymphocytes Relative: 21 %
Lymphs Abs: 1.4 10*3/uL (ref 0.7–4.0)
MCH: 27.6 pg (ref 26.0–34.0)
MCHC: 32.5 g/dL (ref 30.0–36.0)
MCV: 84.8 fL (ref 80.0–100.0)
Monocytes Absolute: 0.3 10*3/uL (ref 0.1–1.0)
Monocytes Relative: 5 %
Neutro Abs: 4.5 10*3/uL (ref 1.7–7.7)
Neutrophils Relative %: 71 %
Platelet Count: 219 10*3/uL (ref 150–400)
RBC: 3.88 MIL/uL (ref 3.87–5.11)
RDW: 13.8 % (ref 11.5–15.5)
WBC Count: 6.5 10*3/uL (ref 4.0–10.5)
nRBC: 0 % (ref 0.0–0.2)

## 2021-02-27 LAB — CMP (CANCER CENTER ONLY)
ALT: <6 U/L (ref 0–44)
AST: 13 U/L — ABNORMAL LOW (ref 15–41)
Albumin: 3.4 g/dL — ABNORMAL LOW (ref 3.5–5.0)
Alkaline Phosphatase: 82 U/L (ref 38–126)
Anion gap: 9 (ref 5–15)
BUN: 15 mg/dL (ref 6–20)
CO2: 25 mmol/L (ref 22–32)
Calcium: 9.9 mg/dL (ref 8.9–10.3)
Chloride: 104 mmol/L (ref 98–111)
Creatinine: 0.68 mg/dL (ref 0.44–1.00)
GFR, Estimated: >60 mL/min (ref >60)
Glucose, Bld: 145 mg/dL — ABNORMAL HIGH (ref 70–99)
Potassium: 3.8 mmol/L (ref 3.5–5.1)
Sodium: 138 mmol/L (ref 135–145)
Total Bilirubin: 0.4 mg/dL (ref 0.3–1.2)
Total Protein: 7.9 g/dL (ref 6.5–8.1)

## 2021-02-27 MED ORDER — HEPARIN SOD (PORK) LOCK FLUSH 100 UNIT/ML IV SOLN
500.0000 [IU] | Freq: Once | INTRAVENOUS | Status: DC
  Filled 2021-02-27: qty 5

## 2021-02-27 MED ORDER — SODIUM CHLORIDE 0.9% FLUSH
10.0000 mL | Freq: Once | INTRAVENOUS | Status: AC
  Administered 2021-02-27: 10 mL
  Filled 2021-02-27: qty 10

## 2021-02-27 NOTE — Progress Notes (Signed)
Confirmed by Elmo Putt RN and Rennis Harding RN that pt goes home with port accessed for TPN treatments.

## 2021-02-27 NOTE — Progress Notes (Signed)
Tanisia sent an email asking to move her port flush/lab appointment to today because her port accidentally was de accessed at home and home health is not able to come out until tomorrow.  Rescheduled port/lab appointment to 2:15 today.

## 2021-02-28 ENCOUNTER — Telehealth: Payer: Self-pay | Admitting: Oncology

## 2021-02-28 ENCOUNTER — Ambulatory Visit (HOSPITAL_COMMUNITY)
Admission: RE | Admit: 2021-02-28 | Discharge: 2021-02-28 | Disposition: A | Discharge status: Home / Self Care | Payer: Medicaid Other | Source: Ambulatory Visit | Attending: Gynecologic Oncology | Admitting: Gynecologic Oncology

## 2021-02-28 ENCOUNTER — Inpatient Hospital Stay: Payer: Medicaid Other

## 2021-02-28 DIAGNOSIS — K632 Fistula of intestine: Secondary | ICD-10-CM | POA: Diagnosis not present

## 2021-02-28 MED ORDER — IOHEXOL 9 MG/ML PO SOLN
1000.0000 mL | ORAL | Status: AC
Start: 2021-02-28 — End: 2021-02-28
  Administered 2021-02-28: 1000 mL via ORAL

## 2021-02-28 MED ORDER — IOHEXOL 350 MG/ML SOLN
100.0000 mL | Freq: Once | INTRAVENOUS | Status: AC | PRN
  Administered 2021-02-28: 80 mL via INTRAVENOUS

## 2021-02-28 MED ORDER — SODIUM CHLORIDE (PF) 0.9 % IJ SOLN
INTRAMUSCULAR | Status: AC
  Filled 2021-02-28: qty 50

## 2021-02-28 NOTE — Telephone Encounter (Signed)
Called Sarah Newman and confirmed that she needs to be at [facility] at 1:15 to drink the water based contrast. She verbalized understanding and then mentioned she had 2 bowel movements yesterday and is wondering if this is normal.  She said they were formed and slender, like frozen yogurt.  She wanted to let Dr. Berline Lopes know.

## 2021-03-01 ENCOUNTER — Encounter: Payer: Self-pay | Admitting: Gynecologic Oncology

## 2021-03-01 ENCOUNTER — Ambulatory Visit: Payer: Medicaid Other | Admitting: Gynecologic Oncology

## 2021-03-01 ENCOUNTER — Inpatient Hospital Stay (HOSPITAL_BASED_OUTPATIENT_CLINIC_OR_DEPARTMENT_OTHER): Payer: Medicaid Other | Admitting: Gynecologic Oncology

## 2021-03-01 ENCOUNTER — Other Ambulatory Visit: Payer: Self-pay

## 2021-03-01 ENCOUNTER — Encounter: Payer: Self-pay | Admitting: Hematology and Oncology

## 2021-03-01 VITALS — BP 110/74 | HR 95 | Temp 97.4°F | Resp 18 | Ht 62.0 in | Wt 192.4 lb

## 2021-03-01 DIAGNOSIS — Z9889 Other specified postprocedural states: Secondary | ICD-10-CM

## 2021-03-01 DIAGNOSIS — K632 Fistula of intestine: Secondary | ICD-10-CM

## 2021-03-01 DIAGNOSIS — R110 Nausea: Secondary | ICD-10-CM

## 2021-03-01 DIAGNOSIS — C563 Malignant neoplasm of bilateral ovaries: Secondary | ICD-10-CM

## 2021-03-01 NOTE — Patient Instructions (Signed)
It was great to see you today. I will have a phone visit with you in 2 weeks and we will tentatively plan to repeat the CT scan in about 3 weeks.

## 2021-03-01 NOTE — Progress Notes (Signed)
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 There is a 16 cm complex cystic mass in the pelvis containing thickened septations located near midline, abutting the superior aspect of the uterus. I suspect this mass is adnexal/ovarian in origin rather than uterine in origin. Specifically, I suspect the mass likely arises from the left ovary/adnexa and is most consistent with a neoplasm. Malignancy is certainly not excluded on this study. Recommend a pelvic ultrasound for further evaluation. 2. The rounded more solid-appearing structure in the right side of the pelvis is probably the right ovary. Recommend attention on ultrasound. 3. The small amount of fluid in the pelvis is likely reactive to the complex cystic mass likely rising from the left ovary/adnexa. 4. No cause for blood in vomit or black stools identified. No convincing evidence of a perforated or inflamed gastric or duodenal ulcer. 5. Hepatic steatosis.   07/02/2019 Imaging   MRI pelvis 1. 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 There are 2 enhancing nodules within the central upper abdomen raising the possibility of peritoneal carcinomatosis. Additionally, there is a small amount fluid within the pelvis. Malignant ascites not excluded. 2. Fibroid uterus. 3. Hepatic steatosis.   07/02/2019 Imaging   US pelvis 1. There is a large mass in the pelvis also seen on CT imaging. A separate left ovary is not visualized. 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 The mass is suspicious for  malignancy. Recommend gynecologic consultation. 2. The right ovary demonstrates an irregular ill-defined hypoechoic hypervascular region centrally. The findings are nonspecific in the right ovary. However, given the apparent left ovarian neoplasm suspicious for malignancy, recommend an MRI to further assess the right ovary. 3. Uterine fibroid measuring 3.6 cm.     07/21/2019 Pathology Results   FINAL MICROSCOPIC DIAGNOSIS: A. 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 - See oncology table. B. OMENTUM, RESECTION: - Omental lymph node with metastatic carcinoma (1/1). - Omental adipose tissue with foci of inflammation and reactive mesothelial changes. C. FALLOPIAN TUBE, LEFT, RESECTION: - Fallopian tube with focal, mild inflammation and fibrosis. - No tumor identified. D. UTERUS, CERVIX, RIGHT TUBE AND OVARY: - Cervix Nabothian cyst and squamous metaplasia. - Endometrium Proliferative. No hyperplasia or carcinoma. - Myometrium Leiomyomata. No malignancy. -Right ovary Poorly differentiated carcinoma, 4.8 cm. See oncology table and comment. -Right Fallopian tube Benign paratubal cyst. No endometriosis or malignancy. ONCOLOGY TABLE: OVARY or FALLOPIAN TUBE or PRIMARY PERITONEUM: Procedure: Bilateral f-oophorectomy, hysterectomy and omentectomy. Specimen Integrity: Intact Tumor Site: Left ovary and right ovary. See comment. Ovarian Surface Involvement: Not identified. Fallopian Tube Surface Involvement: Not identified. Tumor Size: Left ovary: 18.6 x 16.0 x 7.2 cm. Right ovary: 4.8 x 3.2 x 3.2 cm. Histologic Type: Clear cell carcinoma. See comment. Histologic Grade: High-grade. Other Tissue/ Organ Involvement: Omental lymph node with metastatic carcinoma. Peritoneal/Ascitic Fluid: Pending. Treatment Effect: No known presurgical therapy. Pathologic Stage Classification (pTNM, AJCC 8th Edition): pT3a, pN1a. Representative Tumor Block: A2, A3,  A4, A5 A6, A7 and A8. Comment(s): The left ovarian tumor is 18.6 cm in greatest dimension  and has features of clear cell carcinoma. The right ovarian tumor is 4.8 cm in greatest dimension and is poorly differentiated with focal features consistent with clear cell carcinoma. The pattern of involvement suggests that the right ovarian tumor is likely metastatic from the larger left ovarian clear cell carcinoma. Sections of the omentum show a lymph node with metastatic carcinoma. Sections of the remainder of the omentum do not show involvement by carcinoma.   07/21/2019 Surgery   Procedure(s) Performed: Exploratory laparotomy with radical tumor debulking including total hysterectomy, bilateral salpingo-oophorectomy, infra-gastric omentectomy, and washings.   Surgeon: Jeral Pinch, MD    Operative Findings: On EUA, mobile uterus and ovarian mass, situated out of the pelvis, spanning 6-8 cm above the umbilicus.  On intra-abdominal entry, inflamed omentum adherent to much of the 16 cm left ovarian mass, adhesions easily lysed bluntly.  No nodularity of the ovarian mass however on delivery of the mass through the midline incision, there was Intra-Op rupture of one of the smaller cystic components with drainage of clear brown-tinged fluid.  Grossly normal-appearing left fallopian tube as well as right tube.  Right ovary mildly enlarged measuring approximately 4 cm and firm/nodular, suspicious for tumor involvement.  Uterus 8-10 cm with 4 cm anterior mid body fibroid.  Some very small volume miliary disease along right pelvic sidewall, removed with uterine specimen.  Evidence of small diverticular disease.  Small bowel normal appearing although multiple loops of bowel adherent to each other, especially by mesentery, and an inflammatory manner.  An approximately 2 x 2 centimeter nodule suspicious for tumor implant was found in the omentum just inferior to the greater curvature of the stomach.  Additional small  (less than 5 mm) implants noted on the stomach and posterior aspect of the left lobe of the liver.  Otherwise the liver surface and diaphragm were smooth.   08/03/2019 Cancer Staging   Staging form: Ovary, Fallopian Tube, and Primary Peritoneal Carcinoma, AJCC 8th Edition - Pathologic: Stage IIIA2 (pT3a, pN1, cM0) - Signed by Heath Lark, MD on 08/03/2019    08/12/2019 Procedure   Placement of single lumen port a cath via right internal jugular vein. The catheter tip lies at the cavo-atrial junction. A power injectable port a cath was placed and is ready for immediate use.   08/22/2019 -  Chemotherapy   The patient had carboplatin and taxol for chemotherapy treatment.     09/23/2019 Genetic Testing   Negative genetic testing:  No pathogenic variants detected on the [facility] TumorNext-HRD+CancerNext paired germline and somatic genetic test. The report date is 09/23/2019.  The TumorNext-HRD+CancerNext test offered by [facility] includes paired germline and tumor analyses of 11 genes associated with homologous recombination repair (ATM, BARD1, BRIP1, CHEK2, MRE11A, NBN, PALB2, RAD51C, RAD51D, BRCA1, BRCA2) plus germline analyses of 26 additional genes associated with hereditary cancer (APC, AXIN2, BMPR1A, CDH1, CDK4, CDKN2A, DICER1, HOXB13, EPCAM, GREM1, MLH1, MSH2, MSH3, MSH6, MUTYH, NF1, NTHL1, PMS2, POLD1, POLE, PTEN, RECQL, SMAD4, SMARCA4, STK11, and TP53).   11/14/2019 Tumor Marker   Patient's tumor was tested for the following markers: CA-125 Results of the tumor marker test revealed 10.2.   12/05/2019 Tumor Marker   Patient's tumor was tested for the following markers: CA-125 Results of the tumor marker test revealed 9.9   01/05/2020 Imaging   No specific findings of active malignancy. Interval resection of the pelvic mass. Subtle nodularity along the right side of the vaginal cuff merits surveillance.     01/05/2020 Tumor Marker   Patient's tumor  was tested for the following markers: CA-125 Results  of the tumor marker test revealed 9.7   02/17/2020 Tumor Marker   Patient's tumor was tested for the following markers: CA-125 Results of the tumor marker test revealed 9.6   04/05/2020 Imaging   1. There is some minimal stranding along the mesentery and omentum without overt omental caking or well-defined nodularity. Faint bandlike density along the anterior peritoneal reflection in the pelvis, slightly more notable than on prior. Overall the appearance is not considered specific for recurrence but given the slight increase in prominence from 01/05/2020, would encourage careful surveillance by tumor markers and imaging. 2. Mild lower lumbar degenerative facet arthropathy.   04/05/2020 Tumor Marker   Patient's tumor was tested for the following markers: CA-125 Results of the tumor marker test revealed 11   05/29/2020 Tumor Marker   Patient's tumor was tested for the following markers: CA-125. Results of the tumor marker test revealed 17.   07/04/2020 Tumor Marker   Patient's tumor was tested for the following markers: CA-125. Results of the tumor marker test revealed 20.1   07/16/2020 Imaging   Stable mild stranding throughout mesenteric and omental fat, without evidence of discrete masses or ascites. No evidence of new or progressive disease.   08/29/2020 Tumor Marker   Patient's tumor was tested for the following markers: CA-125 Results of the tumor marker test revealed 32.7.   09/07/2020 Imaging   1. Interval progression of peritoneal disease within the abdomen and pelvis. Tumor predominantly involves the serosal surface of the large and small bowel loops. No signs of bowel obstruction identified at this time. 2. No evidence for metastatic disease to the chest.   09/19/2020 Imaging   1. No acute findings identified within the abdomen or pelvis. No evidence for bowel obstruction. 2. Similar appearance of soft tissue thickening along the peritoneal reflections within the abdomen and pelvis  compatible with known peritoneal disease. Recently characterized increased soft tissue along the surface of the jejunal bowel loops and tethering of pelvic small bowel loops is difficult to quantify (and compared with previous exam) due to lack of IV contrast material.     09/20/2020 -  Chemotherapy    Patient is on Treatment Plan: OVARIAN RECURRENT 3RD LINE CARBOPLATIN D1 / GEMCITABINE D1,8 (4/800) Q21D  Carboplatin was discontinued after 10/15/2020 due to allergic reaction      10/01/2020 Tumor Marker   Patient's tumor was tested for the following markers: CA-125. Results of the tumor marker test revealed 37.2.   10/15/2020 Tumor Marker   Patient's tumor was tested for the following markers: CA-125. Results of the tumor marker test revealed 48   10/22/2020 Tumor Marker   Patient's tumor was tested for the following markers: CA-125 Results of the tumor marker test revealed 28.5   11/05/2020 Tumor Marker   Patient's tumor was tested for the following markers: CA-125 Results of the tumor marker test revealed 18.9   12/17/2020 Imaging   1. No significant change in soft tissue thickening and nodularity in the low pelvis, with minimal thickening of the peritoneum throughout the abdomen. 2. Similar appearance of soft tissue thickening about the mid small bowel in the central, ventral abdomen. 3. Findings are consistent with stable peritoneal metastatic disease. 4. Status post hysterectomy and cholecystectomy.   12/18/2020 Tumor Marker   Patient's tumor was tested for the following markers: CA-125 Results of the tumor marker test revealed 10.8   01/11/2021 Imaging   Findings concerning for small bowel obstruction.  01/11/2021 Tumor Marker   Patient's tumor was tested for the following markers: CA-125. Results of the tumor marker test revealed 10.4.   01/11/2021 Imaging   CT abdomen and pelvis Mildly prominent proximal loops of small bowel although no true transition zone is seen in the  distal small bowel appears within normal limits. These changes could represent early partial small bowel obstruction. The overall appearance however is similar to that seen on prior CT from May of 2022.   Mild peritoneal thickening similar to that seen on the prior exam.   Fatty liver.   No other focal abnormality is noted.     Interval History: The patient reports overall doing well.  The symptom that continues to bother her is nausea.  She is using both the ODT Zofran as well as the's Phenergan suppositories and has some relief, but this is not lasting.  She realized recently that she has not been taking a PPI since leaving the hospital.  She had 2 formed bowel movements yesterday morning and had another after drinking the contrast yesterday evening.  She endorses normal bladder function.  She is having very minimal drainage from her incision that continues to be yellow/green in color.  She is otherwise tolerating the TPN without issues.  She feels that she has much more energy and overall feels better in the last couple of weeks.  Past Medical/Surgical History: Past Medical History:  Diagnosis Date   Anemia due to antineoplastic chemotherapy    Arthritis    Asthma due to seasonal allergies    01-21-2021 per pt seasonal and exercised induced, does not have inhaler, last used one few years   Chronic abdominal pain 01/2021   due to partial bowel obstruction   Chronic constipation 01/2021   due to partial bowel obstruction   Chronic nausea    01-21-2021  per pt intermittant nausea and when has abd pain most of time will have vomiting   Family history of breast cancer    Family history of colon cancer    Family history of ovarian cancer    Family history of prostate cancer    History of 2019 novel coronavirus disease (COVID-19) 09/08/2020   positive results in epic, per pt mild symptoms that resolved   History of Helicobacter pylori infection 07/2016   E[facility] w/ bx's by [facility],  chronic  h.pylori w/ gastritis,  treated  (no ulcer)   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;  progression w/ abdominal carcinomatosis   Partial bowel obstruction ([place]) 01/2021   Steroid-induced diabetes ([place])    followed by dr Alvy Bimler    Past Surgical History:  Procedure Laterality Date   CHOLECYSTECTOMY N/A 02/16/2015   Procedure: LAPAROSCOPIC CHOLECYSTECTOMY WITH INTRAOPERATIVE CHOLANGIOGRAM;  Surgeon: Johnathan Hausen, MD;  Location: [facility];  Service: General;  Laterality: N/A;   INCISION AND DRAINAGE ABSCESS N/A 02/02/2021   Procedure: INCISION AND DRAINAGE ABSCESS;  Surgeon: Lahoma Crocker, MD;  Location: [facility];  Service: Gynecology;  Laterality: N/A;   IR IMAGING GUIDED PORT INSERTION  08/12/2019   LAPAROSCOPY N/A 01/23/2021   Procedure: LAPAROSCOPY DIAGNOSTIC;  Surgeon: Lafonda Mosses, MD;  [facility];  Service: Gynecology;  Laterality: N/A;   LAPAROTOMY N/A 01/23/2021   Procedure: LAPAROTOMY, ENTEROTOMY REPAIR OF THE BOWEL;  Surgeon: Lafonda Mosses, MD;  Location: [facility];  Service:  Gynecology;  Laterality: N/A;   SALPINGOOPHORECTOMY N/A 07/21/2019   Procedure: EXPLORATORY LAPAROTOMY, TOTAL ABDOMINAL HYSTERECTOMY, BILATERAL SALPINGO OOPHORECTOMY, OMENTECTOMY;  Surgeon: Lafonda Mosses, MD;  Location: [facility];  Service: Gynecology;  Laterality: N/A;   UPPER GASTROINTESTINAL ENDOSCOPY  08/01/2016   by dr Corinda Gubler in epic    Family History  Problem Relation Age of Onset   Colon cancer Father 23   Prostate cancer Father 57   Colon cancer Paternal Aunt        dx. early 85s   Ovarian cancer Paternal Grandmother 27   Breast cancer Cousin    Endometriosis Mother    Cancer Paternal Uncle        unknown type, dx. early 40s   Cancer Paternal Uncle 18       unknown type   Esophageal cancer Neg  Hx     Social History   Socioeconomic History   Marital status: Married    Spouse name: Herbie [place]   Number of children: 2   Years of education: Not on file   Highest education level: Not on file  Occupational History   Not on file  Tobacco Use   Smoking status: Never   Smokeless tobacco: Never  Vaping Use   Vaping Use: Never used  Substance and Sexual Activity   Alcohol use: Yes    Comment: Social drinker   Drug use: Never   Sexual activity: Yes    Birth control/protection: Surgical  Other Topics Concern   Not on file  Social History Narrative   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  Newly married as of October 2020.   2 boys, age 88 and 75   Social Determinants of Health   Emergency planning/management officer Strain: Not on file  Food Insecurity: Not on file  Transportation Needs: Not on file  Physical Activity: Not on file  Stress: Not on file  Social Connections: Not on file    Current Medications:  Current Outpatient Medications:    Accu-Chek Softclix Lancets lancets, Use as instructed, Disp: 100 each, Rfl: 12   acetaminophen (TYLENOL) 650 MG suppository, Place 1 suppository (650 mg total) rectally every 6 (six) hours as needed for moderate pain., Disp: 15 suppository, Rfl: 2   glucose blood test strip, Use as instructed, Disp: 100 each, Rfl: 12   HYDROmorphone HCl (DILAUDID) 1 MG/ML LIQD, Take 1 mL (1 mg total) by mouth every 4 (four) hours as needed for severe pain or moderate pain., Disp: 30 mL, Rfl: 0   insulin aspart (NOVOLOG) 100 UNIT/ML injection, Inject 0-15 Units into the skin 4 (four) times daily. Correction coverage: Moderate (average weight, post-op) CBG < 70: Implement Hypoglycemia Standing Orders and refer to Hypoglycemia Standing Orders sidebar report CBG 70 - 120: 0 units CBG 121 - 150: 2 units CBG 151 - 200: 3 units CBG 201 - 250: 5 units CBG 251 - 300: 8 units CBG 301 - 350: 11 units CBG 351 - 400: 15 units, Disp: 60 mL, Rfl: 11   lidocaine-prilocaine (EMLA)  cream, APPLY TO THE AFFECTED AREA AS DIRECTED TO ACCESS PORT, Disp: 30 g, Rfl: 11   LORazepam (ATIVAN) 2 MG/ML concentrated solution, Take 0.3 mLs (0.6 mg total) by mouth every 6 (six) hours as needed for up to 20 doses for anxiety or sleep., Disp: 6 mL, Rfl: 0   omeprazole (PRILOSEC) 40 MG capsule, Take 1 capsule (40 mg total) by mouth daily., Disp: 30 capsule, Rfl: 3   ondansetron (ZOFRAN  ODT) 4 MG disintegrating tablet, Take 1 tablet (4 mg total) by mouth every 4 (four) hours as needed for up to 20 doses for nausea or vomiting., Disp: 20 tablet, Rfl: 0   promethazine (PHENERGAN) 12.5 MG suppository, Place 1 suppository (12.5 mg total) rectally every 8 (eight) hours as needed for up to 30 doses for nausea or vomiting., Disp: 30 each, Rfl: 0  Review of Systems: Denies appetite changes, fevers, chills, fatigue, unexplained weight changes. Denies hearing loss, neck lumps or masses, mouth sores, ringing in ears or voice changes. Denies cough or wheezing.  Denies shortness of breath. Denies chest pain or palpitations. Denies leg swelling. Denies abdominal distention, pain, blood in stools, constipation, diarrhea, nausea, vomiting, or early satiety. Denies pain with intercourse, dysuria, frequency, hematuria or incontinence. Denies hot flashes, pelvic pain, vaginal bleeding or vaginal discharge.   Denies joint pain, back pain or muscle pain/cramps. Denies itching, rash, or wounds. Denies dizziness, headaches, numbness or seizures. Denies swollen lymph nodes or glands, denies easy bruising or bleeding. Denies anxiety, depression, confusion, or decreased concentration.  Physical Exam: BP 110/74 (BP Location: Left Arm, Patient Position: Sitting)   Pulse 95   Temp (!) 97.4 F (36.3 C) (Tympanic)   Resp 18   Ht _0  (1.575 m)   Wt 192 lb 6.4 oz (87.3 kg)   LMP 07/05/2019   SpO2 100%   BMI 35.19 kg/m  General: Alert, oriented, no acute distress. HEENT: Normocephalic, atraumatic, sclera  anicteric. Chest: Unlabored breathing on room air.  Port site clean. Abdomen: Obese, soft, nontender.  Normoactive bowel sounds.  No masses or hepatosplenomegaly appreciated.  Mild firmness around the incision, significantly decreased than on prior exams.  The incision has a pinpoint opening and succus appearing fluid can be expressed from this opening. Extremities: Grossly normal range of motion.  Warm, well perfused.  No edema bilaterally.  Laboratory & Radiologic Studies: CT A/P from 7/28: IMPRESSION: 1. Persistence of intracutaneous fistula with diminished stranding and gas in the abdominal wall as described. 2. Colon is in close proximity and likely adherent to small bowel loops. 3. Mesenteric thickening and angulated loops of small bowel are present throughout the lower abdomen. Findings likely reflect changes related to malignant and inflammatory adhesions. 4. Increased soft tissue in the gastrohepatic ligament compared to prior imaging may reflect enlarging lymph node or worsening of peritoneal disease. 5. Post cholecystectomy and hysterectomy. 6. Aortic atherosclerosis.  Assessment & Plan: Sarah Newman is a 45 y.o. 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  The patient developed an EC fistula which has been managed with bowel rest and TPN.  With the exception of her nausea, the patient is feeling much better and is relatively asymptomatic.  She has had significant decrease in output from her EC fistula and now wears just a bandage.  She is able to express a small amount of drainage when she pushes around the incision.  Given drop-off almost no output for  multiple days, CT scan was obtained yesterday.  I reviewed the images myself and also spoke with the reading radiologist.  There is evidence of a persistent but much smaller fistula.  I discussed with the patient my recommendation to continue on TPN for at least  another several weeks.  If she continues to have decreased output and ultimately none, then I anticipate repeating some imaging in about 3 weeks.  We will have a phone visit in 2 weeks to check in to see how she is doing.  In terms of her nausea, I wonder if this is related to not being on any sort of GI prophylaxis.  She will start the omeprazole that I prescribed on discharge and will let me know if this does not help improve her symptoms.  I have encouraged her to talk with home health about the process for coming off TPN.  We discussed that this is a process that involves the decrease the TPN and increase her oral intake.  I also discussed imaging findings on her scan that raise the concern of possible slow progression of cancer.  She has significant adhesive disease, presumed to be related to prior surgery and treatment effect.  There is some stranding within the mesentery and increased tissue density around the gastrohepatic ligament that raise the concern for slight progression of disease.  We discussed plans of follow this on imaging as findings are still somewhat nonspecific.  She understands that at this time, in the setting of her EC fistula, I would not recommend reinitiating chemotherapy.  38 minutes of total time was spent for this patient encounter, including preparation, face-to-face counseling with the patient and coordination of care, and documentation of the encounter.  Jeral Pinch, MD  Division of Gynecologic Oncology  Department of Obstetrics and Gynecology  [facility]

## 2021-03-04 ENCOUNTER — Inpatient Hospital Stay: Payer: Medicaid Other | Admitting: Gynecologic Oncology

## 2021-03-05 ENCOUNTER — Other Ambulatory Visit (HOSPITAL_COMMUNITY): Payer: Self-pay

## 2021-03-05 ENCOUNTER — Telehealth: Payer: Self-pay | Admitting: Oncology

## 2021-03-05 ENCOUNTER — Encounter: Payer: Self-pay | Admitting: Oncology

## 2021-03-05 DIAGNOSIS — C562 Malignant neoplasm of left ovary: Secondary | ICD-10-CM

## 2021-03-05 MED ORDER — AMOXICILLIN-POT CLAVULANATE 875-125 MG PO TABS
ORAL_TABLET | ORAL | 0 refills | Status: DC
  Filled 2021-03-05: qty 14, 7d supply, fill #0

## 2021-03-05 NOTE — Progress Notes (Signed)
Magnesium and phosphorus labs ordered per Doreene Nest, Pharmacist for TPN orders.

## 2021-03-05 NOTE — Telephone Encounter (Signed)
Called Sarah Newman and advised her of apt with Dr. Berline Lopes on 03/08/21 at 12:30 and reviewed her lab apt on 03/07/21.  She verbalized understanding and agreement of apts and times.

## 2021-03-07 ENCOUNTER — Inpatient Hospital Stay: Payer: Medicaid Other | Attending: Gynecologic Oncology

## 2021-03-07 DIAGNOSIS — C563 Malignant neoplasm of bilateral ovaries: Secondary | ICD-10-CM | POA: Insufficient documentation

## 2021-03-07 DIAGNOSIS — Z90710 Acquired absence of both cervix and uterus: Secondary | ICD-10-CM | POA: Insufficient documentation

## 2021-03-07 DIAGNOSIS — G8918 Other acute postprocedural pain: Secondary | ICD-10-CM | POA: Insufficient documentation

## 2021-03-07 DIAGNOSIS — R109 Unspecified abdominal pain: Secondary | ICD-10-CM | POA: Insufficient documentation

## 2021-03-07 DIAGNOSIS — L03311 Cellulitis of abdominal wall: Secondary | ICD-10-CM | POA: Insufficient documentation

## 2021-03-07 DIAGNOSIS — R509 Fever, unspecified: Secondary | ICD-10-CM | POA: Insufficient documentation

## 2021-03-07 DIAGNOSIS — Z90722 Acquired absence of ovaries, bilateral: Secondary | ICD-10-CM | POA: Insufficient documentation

## 2021-03-07 DIAGNOSIS — R112 Nausea with vomiting, unspecified: Secondary | ICD-10-CM | POA: Insufficient documentation

## 2021-03-07 DIAGNOSIS — R5082 Postprocedural fever: Secondary | ICD-10-CM | POA: Insufficient documentation

## 2021-03-07 DIAGNOSIS — R519 Headache, unspecified: Secondary | ICD-10-CM | POA: Insufficient documentation

## 2021-03-07 DIAGNOSIS — K632 Fistula of intestine: Secondary | ICD-10-CM | POA: Insufficient documentation

## 2021-03-07 DIAGNOSIS — Z9221 Personal history of antineoplastic chemotherapy: Secondary | ICD-10-CM | POA: Insufficient documentation

## 2021-03-07 DIAGNOSIS — T8149XA Infection following a procedure, other surgical site, initial encounter: Secondary | ICD-10-CM | POA: Insufficient documentation

## 2021-03-08 ENCOUNTER — Encounter (HOSPITAL_BASED_OUTPATIENT_CLINIC_OR_DEPARTMENT_OTHER): Payer: Self-pay | Admitting: Gynecologic Oncology

## 2021-03-08 ENCOUNTER — Encounter: Payer: Self-pay | Admitting: Gynecologic Oncology

## 2021-03-08 ENCOUNTER — Inpatient Hospital Stay (HOSPITAL_BASED_OUTPATIENT_CLINIC_OR_DEPARTMENT_OTHER): Payer: Medicaid Other | Admitting: Gynecologic Oncology

## 2021-03-08 ENCOUNTER — Inpatient Hospital Stay: Payer: Medicaid Other

## 2021-03-08 ENCOUNTER — Other Ambulatory Visit: Payer: Self-pay

## 2021-03-08 ENCOUNTER — Ambulatory Visit: Payer: Medicaid Other | Admitting: Gynecologic Oncology

## 2021-03-08 VITALS — BP 101/70 | HR 83 | Temp 97.8°F | Resp 18 | Ht 62.0 in | Wt 193.7 lb

## 2021-03-08 DIAGNOSIS — C563 Malignant neoplasm of bilateral ovaries: Secondary | ICD-10-CM | POA: Diagnosis present

## 2021-03-08 DIAGNOSIS — Z90710 Acquired absence of both cervix and uterus: Secondary | ICD-10-CM | POA: Diagnosis not present

## 2021-03-08 DIAGNOSIS — K632 Fistula of intestine: Secondary | ICD-10-CM | POA: Diagnosis not present

## 2021-03-08 DIAGNOSIS — T8149XA Infection following a procedure, other surgical site, initial encounter: Secondary | ICD-10-CM

## 2021-03-08 DIAGNOSIS — R5082 Postprocedural fever: Secondary | ICD-10-CM | POA: Diagnosis not present

## 2021-03-08 DIAGNOSIS — L03311 Cellulitis of abdominal wall: Secondary | ICD-10-CM | POA: Diagnosis not present

## 2021-03-08 DIAGNOSIS — C562 Malignant neoplasm of left ovary: Secondary | ICD-10-CM

## 2021-03-08 DIAGNOSIS — G8918 Other acute postprocedural pain: Secondary | ICD-10-CM | POA: Diagnosis not present

## 2021-03-08 DIAGNOSIS — R112 Nausea with vomiting, unspecified: Secondary | ICD-10-CM | POA: Diagnosis not present

## 2021-03-08 DIAGNOSIS — R109 Unspecified abdominal pain: Secondary | ICD-10-CM | POA: Diagnosis not present

## 2021-03-08 DIAGNOSIS — R519 Headache, unspecified: Secondary | ICD-10-CM | POA: Diagnosis not present

## 2021-03-08 DIAGNOSIS — Z9221 Personal history of antineoplastic chemotherapy: Secondary | ICD-10-CM | POA: Diagnosis not present

## 2021-03-08 DIAGNOSIS — Z90722 Acquired absence of ovaries, bilateral: Secondary | ICD-10-CM | POA: Diagnosis not present

## 2021-03-08 LAB — CBC (CANCER CENTER ONLY)
HCT: 31.3 % — ABNORMAL LOW (ref 36.0–46.0)
Hemoglobin: 10.1 g/dL — ABNORMAL LOW (ref 12.0–15.0)
MCH: 27.2 pg (ref 26.0–34.0)
MCHC: 32.3 g/dL (ref 30.0–36.0)
MCV: 84.4 fL (ref 80.0–100.0)
Platelet Count: 255 10*3/uL (ref 150–400)
RBC: 3.71 MIL/uL — ABNORMAL LOW (ref 3.87–5.11)
RDW: 13.5 % (ref 11.5–15.5)
WBC Count: 5.9 10*3/uL (ref 4.0–10.5)
nRBC: 0 % (ref 0.0–0.2)

## 2021-03-08 LAB — CMP (CANCER CENTER ONLY)
ALT: 8 U/L (ref 0–44)
AST: 12 U/L — ABNORMAL LOW (ref 15–41)
Albumin: 3.3 g/dL — ABNORMAL LOW (ref 3.5–5.0)
Alkaline Phosphatase: 82 U/L (ref 38–126)
Anion gap: 10 (ref 5–15)
BUN: 14 mg/dL (ref 6–20)
CO2: 25 mmol/L (ref 22–32)
Calcium: 9.7 mg/dL (ref 8.9–10.3)
Chloride: 105 mmol/L (ref 98–111)
Creatinine: 0.65 mg/dL (ref 0.44–1.00)
GFR, Estimated: >60 mL/min (ref >60)
Glucose, Bld: 147 mg/dL — ABNORMAL HIGH (ref 70–99)
Potassium: 4 mmol/L (ref 3.5–5.1)
Sodium: 140 mmol/L (ref 135–145)
Total Bilirubin: <0.2 mg/dL — ABNORMAL LOW (ref 0.3–1.2)
Total Protein: 7.6 g/dL (ref 6.5–8.1)

## 2021-03-08 LAB — PHOSPHORUS: Phosphorus: 4.4 mg/dL (ref 2.5–4.6)

## 2021-03-08 LAB — MAGNESIUM: Magnesium: 1.7 mg/dL (ref 1.7–2.4)

## 2021-03-08 MED ORDER — SODIUM CHLORIDE 0.9% FLUSH
10.0000 mL | Freq: Once | INTRAVENOUS | Status: AC
  Administered 2021-03-08: 10 mL
  Filled 2021-03-08: qty 10

## 2021-03-08 MED ORDER — HEPARIN SOD (PORK) LOCK FLUSH 100 UNIT/ML IV SOLN
500.0000 [IU] | Freq: Once | INTRAVENOUS | Status: DC
  Filled 2021-03-08: qty 5

## 2021-03-08 NOTE — Progress Notes (Signed)
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 There is a 16 cm complex cystic mass in the pelvis containing thickened septations located near midline, abutting the superior aspect of the uterus. I suspect this mass is adnexal/ovarian in origin rather than uterine in origin. Specifically, I suspect the mass likely arises from the left ovary/adnexa and is most consistent with a neoplasm. Malignancy is certainly not excluded on this study. Recommend a pelvic ultrasound for further evaluation. 2. The rounded more solid-appearing structure in the right side of the pelvis is probably the right ovary. Recommend attention on ultrasound. 3. The small amount of fluid in the pelvis is likely reactive to the complex cystic mass likely rising from the left ovary/adnexa. 4. No cause for blood in vomit or black stools identified. No convincing evidence of a perforated or inflamed gastric or duodenal ulcer. 5. Hepatic steatosis.   07/02/2019 Imaging   MRI pelvis 1. 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 There are 2 enhancing nodules within the central upper abdomen raising the possibility of peritoneal carcinomatosis. Additionally, there is a small amount fluid within the pelvis. Malignant ascites not excluded. 2. Fibroid uterus. 3. Hepatic steatosis.   07/02/2019 Imaging   US pelvis 1. There is a large mass in the pelvis also seen on CT imaging. A separate left ovary is not visualized. 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 The mass is suspicious for malignancy. Recommend gynecologic consultation. 2. The right ovary  demonstrates an irregular ill-defined hypoechoic hypervascular region centrally. The findings are nonspecific in the right ovary. However, given the apparent left ovarian neoplasm suspicious for malignancy, recommend an MRI to further assess the right ovary. 3. Uterine fibroid measuring 3.6 cm.     07/21/2019 Pathology Results   FINAL MICROSCOPIC DIAGNOSIS: A. 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 - See oncology table. B. OMENTUM, RESECTION: - Omental lymph node with metastatic carcinoma (1/1). - Omental adipose tissue with foci of inflammation and reactive mesothelial changes. C. FALLOPIAN TUBE, LEFT, RESECTION: - Fallopian tube with focal, mild inflammation and fibrosis. - No tumor identified. D. UTERUS, CERVIX, RIGHT TUBE AND OVARY: - Cervix Nabothian cyst and squamous metaplasia. - Endometrium Proliferative. No hyperplasia or carcinoma. - Myometrium Leiomyomata. No malignancy. -Right ovary Poorly differentiated carcinoma, 4.8 cm. See oncology table and comment. -Right Fallopian tube Benign paratubal cyst. No endometriosis or malignancy. ONCOLOGY TABLE: OVARY or FALLOPIAN TUBE or PRIMARY PERITONEUM: Procedure: Bilateral f-oophorectomy, hysterectomy and omentectomy. Specimen Integrity: Intact Tumor Site: Left ovary and right ovary. See comment. Ovarian Surface Involvement: Not identified. Fallopian Tube Surface Involvement: Not identified. Tumor Size: Left ovary: 18.6 x 16.0 x 7.2 cm. Right ovary: 4.8 x 3.2 x 3.2 cm. Histologic Type: Clear cell carcinoma. See comment. Histologic Grade: High-grade. Other Tissue/ Organ Involvement: Omental lymph node with metastatic carcinoma. Peritoneal/Ascitic Fluid: Pending. Treatment Effect: No known presurgical therapy. Pathologic Stage Classification (pTNM, AJCC 8th Edition): pT3a, pN1a. Representative Tumor Block: A2, A3, A4, A5 A6, A7 and A8. Comment(s): The left ovarian tumor is 18.6 cm  in greatest dimension and has features of clear cell carcinoma.  The right ovarian tumor is 4.8 cm in greatest dimension and is poorly differentiated with focal features consistent with clear cell carcinoma. The pattern of involvement suggests that the right ovarian tumor is likely metastatic from the larger left ovarian clear cell carcinoma. Sections of the omentum show a lymph node with metastatic carcinoma. Sections of the remainder of the omentum do not show involvement by carcinoma.   07/21/2019 Surgery   Procedure(s) Performed: Exploratory laparotomy with radical tumor debulking including total hysterectomy, bilateral salpingo-oophorectomy, infra-gastric omentectomy, and washings.   Surgeon: Sissi Padia, MD    Operative Findings: On EUA, mobile uterus and ovarian mass, situated out of the pelvis, spanning 6-8 cm above the umbilicus.  On intra-abdominal entry, inflamed omentum adherent to much of the 16 cm left ovarian mass, adhesions easily lysed bluntly.  No nodularity of the ovarian mass however on delivery of the mass through the midline incision, there was Intra-Op rupture of one of the smaller cystic components with drainage of clear brown-tinged fluid.  Grossly normal-appearing left fallopian tube as well as right tube.  Right ovary mildly enlarged measuring approximately 4 cm and firm/nodular, suspicious for tumor involvement.  Uterus 8-10 cm with 4 cm anterior mid body fibroid.  Some very small volume miliary disease along right pelvic sidewall, removed with uterine specimen.  Evidence of small diverticular disease.  Small bowel normal appearing although multiple loops of bowel adherent to each other, especially by mesentery, and an inflammatory manner.  An approximately 2 x 2 centimeter nodule suspicious for tumor implant was found in the omentum just inferior to the greater curvature of the stomach.  Additional small (less than 5 mm) implants noted on the stomach and posterior aspect of  the left lobe of the liver.  Otherwise the liver surface and diaphragm were smooth.   08/03/2019 Cancer Staging   Staging form: Ovary, Fallopian Tube, and Primary Peritoneal Carcinoma, AJCC 8th Edition - Pathologic: Stage IIIA2 (pT3a, pN1, cM0) - Signed by Gorsuch, Ni, MD on 08/03/2019    08/12/2019 Procedure   Placement of single lumen port a cath via right internal jugular vein. The catheter tip lies at the cavo-atrial junction. A power injectable port a cath was placed and is ready for immediate use.   08/22/2019 -  Chemotherapy   The patient had carboplatin and taxol for chemotherapy treatment.     09/23/2019 Genetic Testing   Negative genetic testing:  No pathogenic variants detected on the [facility] TumorNext-HRD+CancerNext paired germline and somatic genetic test. The report date is 09/23/2019.  The TumorNext-HRD+CancerNext test offered by [facility] includes paired germline and tumor analyses of 11 genes associated with homologous recombination repair (ATM, BARD1, BRIP1, CHEK2, MRE11A, NBN, PALB2, RAD51C, RAD51D, BRCA1, BRCA2) plus germline analyses of 26 additional genes associated with hereditary cancer (APC, AXIN2, BMPR1A, CDH1, CDK4, CDKN2A, DICER1, HOXB13, EPCAM, GREM1, MLH1, MSH2, MSH3, MSH6, MUTYH, NF1, NTHL1, PMS2, POLD1, POLE, PTEN, RECQL, SMAD4, SMARCA4, STK11, and TP53).   11/14/2019 Tumor Marker   Patient's tumor was tested for the following markers: CA-125 Results of the tumor marker test revealed 10.2.   12/05/2019 Tumor Marker   Patient's tumor was tested for the following markers: CA-125 Results of the tumor marker test revealed 9.9   01/05/2020 Imaging   No specific findings of active malignancy. Interval resection of the pelvic mass. Subtle nodularity along the right side of the vaginal cuff merits surveillance.     01/05/2020 Tumor Marker   Patient's tumor was tested for the following markers: CA-125   Results of the tumor marker test revealed 9.7   02/17/2020 Tumor Marker    Patient's tumor was tested for the following markers: CA-125 Results of the tumor marker test revealed 9.6   04/05/2020 Imaging   1. There is some minimal stranding along the mesentery and omentum without overt omental caking or well-defined nodularity. Faint bandlike density along the anterior peritoneal reflection in the pelvis, slightly more notable than on prior. Overall the appearance is not considered specific for recurrence but given the slight increase in prominence from 01/05/2020, would encourage careful surveillance by tumor markers and imaging. 2. Mild lower lumbar degenerative facet arthropathy.   04/05/2020 Tumor Marker   Patient's tumor was tested for the following markers: CA-125 Results of the tumor marker test revealed 11   05/29/2020 Tumor Marker   Patient's tumor was tested for the following markers: CA-125. Results of the tumor marker test revealed 17.   07/04/2020 Tumor Marker   Patient's tumor was tested for the following markers: CA-125. Results of the tumor marker test revealed 20.1   07/16/2020 Imaging   Stable mild stranding throughout mesenteric and omental fat, without evidence of discrete masses or ascites. No evidence of new or progressive disease.   08/29/2020 Tumor Marker   Patient's tumor was tested for the following markers: CA-125 Results of the tumor marker test revealed 32.7.   09/07/2020 Imaging   1. Interval progression of peritoneal disease within the abdomen and pelvis. Tumor predominantly involves the serosal surface of the large and small bowel loops. No signs of bowel obstruction identified at this time. 2. No evidence for metastatic disease to the chest.   09/19/2020 Imaging   1. No acute findings identified within the abdomen or pelvis. No evidence for bowel obstruction. 2. Similar appearance of soft tissue thickening along the peritoneal reflections within the abdomen and pelvis compatible with known peritoneal disease. Recently characterized  increased soft tissue along the surface of the jejunal bowel loops and tethering of pelvic small bowel loops is difficult to quantify (and compared with previous exam) due to lack of IV contrast material.     09/20/2020 -  Chemotherapy    Patient is on Treatment Plan: OVARIAN RECURRENT 3RD LINE CARBOPLATIN D1 / GEMCITABINE D1,8 (4/800) Q21D  Carboplatin was discontinued after 10/15/2020 due to allergic reaction      10/01/2020 Tumor Marker   Patient's tumor was tested for the following markers: CA-125. Results of the tumor marker test revealed 37.2.   10/15/2020 Tumor Marker   Patient's tumor was tested for the following markers: CA-125. Results of the tumor marker test revealed 48   10/22/2020 Tumor Marker   Patient's tumor was tested for the following markers: CA-125 Results of the tumor marker test revealed 28.5   11/05/2020 Tumor Marker   Patient's tumor was tested for the following markers: CA-125 Results of the tumor marker test revealed 18.9   12/17/2020 Imaging   1. No significant change in soft tissue thickening and nodularity in the low pelvis, with minimal thickening of the peritoneum throughout the abdomen. 2. Similar appearance of soft tissue thickening about the mid small bowel in the central, ventral abdomen. 3. Findings are consistent with stable peritoneal metastatic disease. 4. Status post hysterectomy and cholecystectomy.   12/18/2020 Tumor Marker   Patient's tumor was tested for the following markers: CA-125 Results of the tumor marker test revealed 10.8   01/11/2021 Imaging   Findings concerning for small bowel obstruction.     01/11/2021 Tumor Marker  Patient's tumor was tested for the following markers: CA-125. Results of the tumor marker test revealed 10.4.   01/11/2021 Imaging   CT abdomen and pelvis Mildly prominent proximal loops of small bowel although no true transition zone is seen in the distal small bowel appears within normal limits. These changes  could represent early partial small bowel obstruction. The overall appearance however is similar to that seen on prior CT from May of 2022.   Mild peritoneal thickening similar to that seen on the prior exam.   Fatty liver.   No other focal abnormality is noted.     Interval History: The patient presents today for follow-up.  She saw Dr. White earlier this week and after her last visit with me had developed a firm and tender collection underneath her incision that was warm to the touch.  Her incision was opened and a second smaller incision was made with drainage of some purulent fluid.  The patient was placed on a course of Augmentin.  She presents today stating that overall she is doing much better.  She notes that her purulent drainage became serosanguineous and is now back to yellow/green in color.  She denies any pain around her incision, fluid collections, or warmth to her skin.  She is having a little bit to eat when she takes her antibiotics.  Her nausea has significantly improved and mostly resolved with starting her GI prophylaxis.  She is having daily bowel function, mostly that is unformed.  Past Medical/Surgical History: Past Medical History:  Diagnosis Date   Anemia due to antineoplastic chemotherapy    Arthritis    Asthma due to seasonal allergies    01-21-2021 per pt seasonal and exercised induced, does not have inhaler, last used one few years   Chronic abdominal pain 01/2021   due to partial bowel obstruction   Chronic constipation 01/2021   due to partial bowel obstruction   Chronic nausea    01-21-2021  per pt intermittant nausea and when has abd pain most of time will have vomiting   Family history of breast cancer    Family history of colon cancer    Family history of ovarian cancer    Family history of prostate cancer    History of 2019 novel coronavirus disease (COVID-19) 09/08/2020   positive results in epic, per pt mild symptoms that resolved   History of  Helicobacter pylori infection 07/2016   EGD w/ bx's by [facility],  chronic h.pylori w/ gastritis,  treated  (no ulcer)   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[facility]) 07/2019   oncologist--- dr gorsuch/ dr Jalee Saine--- 07-21-2019 s/p exp. 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[facility])    followed by dr gorsuch    Past Surgical History:  Procedure Laterality Date   CHOLECYSTECTOMY N/A 02/16/2015   Procedure: LAPAROSCOPIC CHOLECYSTECTOMY WITH INTRAOPERATIVE CHOLANGIOGRAM;  Surgeon: Matthew Martin, MD;  Location: [facility];  Service: General;  Laterality: N/A;   INCISION AND DRAINAGE ABSCESS N/A 02/02/2021   Procedure: INCISION AND DRAINAGE ABSCESS;  Surgeon: Jackson-Moore, Lisa, MD;  Location: [facility];  Service: Gynecology;  Laterality: N/A;   IR IMAGING GUIDED PORT INSERTION  08/12/2019   LAPAROSCOPY N/A 01/23/2021   Procedure: LAPAROSCOPY DIAGNOSTIC;  Surgeon: Evi Mccomb R, MD;  Location: [facility];  Service: Gynecology;  Laterality: N/A;   LAPAROTOMY N/A 01/23/2021   Procedure: LAPAROTOMY,   ENTEROTOMY REPAIR OF THE BOWEL;  Surgeon: Haya Hemler R, MD;  Location: [facility];  Service: Gynecology;  Laterality: N/A;   SALPINGOOPHORECTOMY N/A 07/21/2019   Procedure: EXPLORATORY LAPAROTOMY, TOTAL ABDOMINAL HYSTERECTOMY, BILATERAL SALPINGO OOPHORECTOMY, OMENTECTOMY;  Surgeon: Sonia Bromell R, MD;  Location: [facility];  Service: Gynecology;  Laterality: N/A;   UPPER GASTROINTESTINAL ENDOSCOPY  08/01/2016   by dr gasaway in epic    Family History  Problem Relation Age of Onset   Colon cancer Father 58   Prostate cancer Father 62   Colon cancer Paternal Aunt        dx. early 70s   Ovarian cancer Paternal Grandmother 70   Breast cancer Cousin    Endometriosis Mother    Cancer Paternal Uncle        unknown type, dx. early 70s    Cancer Paternal Uncle 82       unknown type   Esophageal cancer Neg Hx     Social History   Socioeconomic History   Marital status: Married    Spouse name: Robert   Number of children: 2   Years of education: Not on file   Highest education level: Not on file  Occupational History   Not on file  Tobacco Use   Smoking status: Never   Smokeless tobacco: Never  Vaping Use   Vaping Use: Never used  Substance and Sexual Activity   Alcohol use: Yes    Comment: Social drinker   Drug use: Never   Sexual activity: Yes    Birth control/protection: Surgical  Other Topics Concern   Not on file  Social History Narrative   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  Newly married as of October 2020.   2 boys, age 19 and 13   Social Determinants of Health   Financial Resource Strain: Not on file  Food Insecurity: Not on file  Transportation Needs: Not on file  Physical Activity: Not on file  Stress: Not on file  Social Connections: Not on file    Current Medications:  Current Outpatient Medications:    Accu-Chek Softclix Lancets lancets, Use as instructed, Disp: 100 each, Rfl: 12   acetaminophen (TYLENOL) 650 MG suppository, Place 1 suppository (650 mg total) rectally every 6 (six) hours as needed for moderate pain., Disp: 15 suppository, Rfl: 2   amoxicillin-clavulanate (AUGMENTIN) 875-125 MG tablet, Take 1 tablet by mouth every 12 (twelve) hours for 7 days, Disp: 14 tablet, Rfl: 0   glucose blood test strip, Use as instructed, Disp: 100 each, Rfl: 12   HYDROmorphone HCl (DILAUDID) 1 MG/ML LIQD, Take 1 mL (1 mg total) by mouth every 4 (four) hours as needed for severe pain or moderate pain., Disp: 30 mL, Rfl: 0   insulin aspart (NOVOLOG) 100 UNIT/ML injection, Inject 0-15 Units into the skin 4 (four) times daily. Correction coverage: Moderate (average weight, post-op) CBG < 70: Implement Hypoglycemia Standing Orders and refer to Hypoglycemia Standing Orders sidebar report CBG  70 - 120: 0 units CBG 121 - 150: 2 units CBG 151 - 200: 3 units CBG 201 - 250: 5 units CBG 251 - 300: 8 units CBG 301 - 350: 11 units CBG 351 - 400: 15 units, Disp: 60 mL, Rfl: 11   lidocaine-prilocaine (EMLA) cream, APPLY TO THE AFFECTED AREA AS DIRECTED TO ACCESS PORT, Disp: 30 g, Rfl: 11   LORazepam (ATIVAN) 2 MG/ML concentrated solution, Take 0.3 mLs (0.6 mg total) by mouth every 6 (  six) hours as needed for up to 20 doses for anxiety or sleep., Disp: 6 mL, Rfl: 0   omeprazole (PRILOSEC) 40 MG capsule, Take 1 capsule (40 mg total) by mouth daily., Disp: 30 capsule, Rfl: 3   ondansetron (ZOFRAN ODT) 4 MG disintegrating tablet, Take 1 tablet (4 mg total) by mouth every 4 (four) hours as needed for up to 20 doses for nausea or vomiting., Disp: 20 tablet, Rfl: 0   promethazine (PHENERGAN) 12.5 MG suppository, Place 1 suppository (12.5 mg total) rectally every 8 (eight) hours as needed for up to 30 doses for nausea or vomiting., Disp: 30 each, Rfl: 0  Review of Systems: Denies appetite changes, fevers, chills, fatigue, unexplained weight changes. Denies hearing loss, neck lumps or masses, mouth sores, ringing in ears or voice changes. Denies cough or wheezing.  Denies shortness of breath. Denies chest pain or palpitations. Denies leg swelling. Denies abdominal distention, pain, blood in stools, constipation, diarrhea, nausea, vomiting, or early satiety. Denies pain with intercourse, dysuria, frequency, hematuria or incontinence. Denies hot flashes, pelvic pain, vaginal bleeding or vaginal discharge.   Denies joint pain, back pain or muscle pain/cramps. Denies itching, rash, or wounds. Denies dizziness, headaches, numbness or seizures. Denies swollen lymph nodes or glands, denies easy bruising or bleeding. Denies anxiety, depression, confusion, or decreased concentration.  Physical Exam: BP 101/70 (BP Location: Left Arm, Patient Position: Sitting)   Pulse 83   Temp 97.8 F (36.6 C) (Tympanic)    Resp 18   Ht 5' 2" (1.575 m)   Wt 193 lb 11.2 oz (87.9 kg)   LMP 07/05/2019   SpO2 100%   BMI 35.43 kg/m  General: Alert, oriented, no acute distress. HEENT: Normocephalic, atraumatic, sclera anicteric. Chest: Unlabored breathing on room air Cardiovascular: Regular rate and rhythm, no murmurs. Abdomen: Obese, soft, nontender.  Normoactive bowel sounds.  No masses or hepatosplenomegaly appreciated.  Bandages taken off.  There is enteric colored drainage on her bandage.  The larger incision is approximately 1 inch in size, smaller incision about 1 cm in along a portion of her old laparotomy incision.  Both incisions are probed with no purulent or enteric drainage noted.  There is some mild induration that I think is scar tissue that is nonfluctuant and not warm to the touch between the 2 incision sites. Extremities: Grossly normal range of motion.  Warm, well perfused.  No edema bilaterally.  Laboratory & Radiologic Studies: None new  Assessment & Plan: Sarah Newman is a 44 y.o. 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  The patient developed an EC fistula which has been managed with bowel rest and TPN.  No evidence of infection today.  I am somewhat concerned that her larger incision has closed enough that with continued output from her fistula, she may recollect and develop a subcutaneous infection again.  I have recommended that in the next couple of days, we plan an outpatient procedure for opening the incision, exploration and drainage of any pockets found, and likely reapplication of an Eakin pouch.  While her fistula seems to have decreased both in output as well  as in appearance on CT imaging, her CT recently showed persistence of the fistula.  We discussed the importance of   having site for drainage if there is continued enteric output into the subcutaneous tissue so that it does not collect and cause infection and pain.  Patient is amenable to proceeding with scheduling surgery.  We will plan for this on Monday.  32 minutes of total time was spent for this patient encounter, including preparation, face-to-face counseling with the patient and coordination of care, and documentation of the encounter.  Sarah Riggenbach, MD  Division of Gynecologic Oncology  Department of Obstetrics and Gynecology  [facility]   

## 2021-03-08 NOTE — H&P (View-Only) (Signed)
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 There is a 16 cm complex cystic mass in the pelvis containing thickened septations located near midline, abutting the superior aspect of the uterus. I suspect this mass is adnexal/ovarian in origin rather than uterine in origin. Specifically, I suspect the mass likely arises from the left ovary/adnexa and is most consistent with a neoplasm. Malignancy is certainly not excluded on this study. Recommend a pelvic ultrasound for further evaluation. 2. The rounded more solid-appearing structure in the right side of the pelvis is probably the right ovary. Recommend attention on ultrasound. 3. The small amount of fluid in the pelvis is likely reactive to the complex cystic mass likely rising from the left ovary/adnexa. 4. No cause for blood in vomit or black stools identified. No convincing evidence of a perforated or inflamed gastric or duodenal ulcer. 5. Hepatic steatosis.   07/02/2019 Imaging   MRI pelvis 1. 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 There are 2 enhancing nodules within the central upper abdomen raising the possibility of peritoneal carcinomatosis. Additionally, there is a small amount fluid within the pelvis. Malignant ascites not excluded. 2. Fibroid uterus. 3. Hepatic steatosis.   07/02/2019 Imaging   US pelvis 1. There is a large mass in the pelvis also seen on CT imaging. A separate left ovary is not visualized. 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 The mass is suspicious for malignancy. Recommend gynecologic consultation. 2. The right ovary  demonstrates an irregular ill-defined hypoechoic hypervascular region centrally. The findings are nonspecific in the right ovary. However, given the apparent left ovarian neoplasm suspicious for malignancy, recommend an MRI to further assess the right ovary. 3. Uterine fibroid measuring 3.6 cm.     07/21/2019 Pathology Results   FINAL MICROSCOPIC DIAGNOSIS: A. 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 - See oncology table. B. OMENTUM, RESECTION: - Omental lymph node with metastatic carcinoma (1/1). - Omental adipose tissue with foci of inflammation and reactive mesothelial changes. C. FALLOPIAN TUBE, LEFT, RESECTION: - Fallopian tube with focal, mild inflammation and fibrosis. - No tumor identified. D. UTERUS, CERVIX, RIGHT TUBE AND OVARY: - Cervix Nabothian cyst and squamous metaplasia. - Endometrium Proliferative. No hyperplasia or carcinoma. - Myometrium Leiomyomata. No malignancy. -Right ovary Poorly differentiated carcinoma, 4.8 cm. See oncology table and comment. -Right Fallopian tube Benign paratubal cyst. No endometriosis or malignancy. ONCOLOGY TABLE: OVARY or FALLOPIAN TUBE or PRIMARY PERITONEUM: Procedure: Bilateral f-oophorectomy, hysterectomy and omentectomy. Specimen Integrity: Intact Tumor Site: Left ovary and right ovary. See comment. Ovarian Surface Involvement: Not identified. Fallopian Tube Surface Involvement: Not identified. Tumor Size: Left ovary: 18.6 x 16.0 x 7.2 cm. Right ovary: 4.8 x 3.2 x 3.2 cm. Histologic Type: Clear cell carcinoma. See comment. Histologic Grade: High-grade. Other Tissue/ Organ Involvement: Omental lymph node with metastatic carcinoma. Peritoneal/Ascitic Fluid: Pending. Treatment Effect: No known presurgical therapy. Pathologic Stage Classification (pTNM, AJCC 8th Edition): pT3a, pN1a. Representative Tumor Block: A2, A3, A4, A5 A6, A7 and A8. Comment(s): The left ovarian tumor is 18.6 cm  in greatest dimension and has features of clear cell carcinoma.  The right ovarian tumor is 4.8 cm in greatest dimension and is poorly differentiated with focal features consistent with clear cell carcinoma. The pattern of involvement suggests that the right ovarian tumor is likely metastatic from the larger left ovarian clear cell carcinoma. Sections of the omentum show a lymph node with metastatic carcinoma. Sections of the remainder of the omentum do not show involvement by carcinoma.   07/21/2019 Surgery   Procedure(s) Performed: Exploratory laparotomy with radical tumor debulking including total hysterectomy, bilateral salpingo-oophorectomy, infra-gastric omentectomy, and washings.   Surgeon: Jeral Pinch, MD    Operative Findings: On EUA, mobile uterus and ovarian mass, situated out of the pelvis, spanning 6-8 cm above the umbilicus.  On intra-abdominal entry, inflamed omentum adherent to much of the 16 cm left ovarian mass, adhesions easily lysed bluntly.  No nodularity of the ovarian mass however on delivery of the mass through the midline incision, there was Intra-Op rupture of one of the smaller cystic components with drainage of clear brown-tinged fluid.  Grossly normal-appearing left fallopian tube as well as right tube.  Right ovary mildly enlarged measuring approximately 4 cm and firm/nodular, suspicious for tumor involvement.  Uterus 8-10 cm with 4 cm anterior mid body fibroid.  Some very small volume miliary disease along right pelvic sidewall, removed with uterine specimen.  Evidence of small diverticular disease.  Small bowel normal appearing although multiple loops of bowel adherent to each other, especially by mesentery, and an inflammatory manner.  An approximately 2 x 2 centimeter nodule suspicious for tumor implant was found in the omentum just inferior to the greater curvature of the stomach.  Additional small (less than 5 mm) implants noted on the stomach and posterior aspect of  the left lobe of the liver.  Otherwise the liver surface and diaphragm were smooth.   08/03/2019 Cancer Staging   Staging form: Ovary, Fallopian Tube, and Primary Peritoneal Carcinoma, AJCC 8th Edition - Pathologic: Stage IIIA2 (pT3a, pN1, cM0) - Signed by Heath Lark, MD on 08/03/2019    08/12/2019 Procedure   Placement of single lumen port a cath via right internal jugular vein. The catheter tip lies at the cavo-atrial junction. A power injectable port a cath was placed and is ready for immediate use.   08/22/2019 -  Chemotherapy   The patient had carboplatin and taxol for chemotherapy treatment.     09/23/2019 Genetic Testing   Negative genetic testing:  No pathogenic variants detected on the [facility] TumorNext-HRD+CancerNext paired germline and somatic genetic test. The report date is 09/23/2019.  The TumorNext-HRD+CancerNext test offered by [facility] includes paired germline and tumor analyses of 11 genes associated with homologous recombination repair (ATM, BARD1, BRIP1, CHEK2, MRE11A, NBN, PALB2, RAD51C, RAD51D, BRCA1, BRCA2) plus germline analyses of 26 additional genes associated with hereditary cancer (APC, AXIN2, BMPR1A, CDH1, CDK4, CDKN2A, DICER1, HOXB13, EPCAM, GREM1, MLH1, MSH2, MSH3, MSH6, MUTYH, NF1, NTHL1, PMS2, POLD1, POLE, PTEN, RECQL, SMAD4, SMARCA4, STK11, and TP53).   11/14/2019 Tumor Marker   Patient's tumor was tested for the following markers: CA-125 Results of the tumor marker test revealed 10.2.   12/05/2019 Tumor Marker   Patient's tumor was tested for the following markers: CA-125 Results of the tumor marker test revealed 9.9   01/05/2020 Imaging   No specific findings of active malignancy. Interval resection of the pelvic mass. Subtle nodularity along the right side of the vaginal cuff merits surveillance.     01/05/2020 Tumor Marker   Patient's tumor was tested for the following markers: CA-125  Results of the tumor marker test revealed 9.7   02/17/2020 Tumor Marker    Patient's tumor was tested for the following markers: CA-125 Results of the tumor marker test revealed 9.6   04/05/2020 Imaging   1. There is some minimal stranding along the mesentery and omentum without overt omental caking or well-defined nodularity. Faint bandlike density along the anterior peritoneal reflection in the pelvis, slightly more notable than on prior. Overall the appearance is not considered specific for recurrence but given the slight increase in prominence from 01/05/2020, would encourage careful surveillance by tumor markers and imaging. 2. Mild lower lumbar degenerative facet arthropathy.   04/05/2020 Tumor Marker   Patient's tumor was tested for the following markers: CA-125 Results of the tumor marker test revealed 11   05/29/2020 Tumor Marker   Patient's tumor was tested for the following markers: CA-125. Results of the tumor marker test revealed 17.   07/04/2020 Tumor Marker   Patient's tumor was tested for the following markers: CA-125. Results of the tumor marker test revealed 20.1   07/16/2020 Imaging   Stable mild stranding throughout mesenteric and omental fat, without evidence of discrete masses or ascites. No evidence of new or progressive disease.   08/29/2020 Tumor Marker   Patient's tumor was tested for the following markers: CA-125 Results of the tumor marker test revealed 32.7.   09/07/2020 Imaging   1. Interval progression of peritoneal disease within the abdomen and pelvis. Tumor predominantly involves the serosal surface of the large and small bowel loops. No signs of bowel obstruction identified at this time. 2. No evidence for metastatic disease to the chest.   09/19/2020 Imaging   1. No acute findings identified within the abdomen or pelvis. No evidence for bowel obstruction. 2. Similar appearance of soft tissue thickening along the peritoneal reflections within the abdomen and pelvis compatible with known peritoneal disease. Recently characterized  increased soft tissue along the surface of the jejunal bowel loops and tethering of pelvic small bowel loops is difficult to quantify (and compared with previous exam) due to lack of IV contrast material.     09/20/2020 -  Chemotherapy    Patient is on Treatment Plan: OVARIAN RECURRENT 3RD LINE CARBOPLATIN D1 / GEMCITABINE D1,8 (4/800) Q21D  Carboplatin was discontinued after 10/15/2020 due to allergic reaction      10/01/2020 Tumor Marker   Patient's tumor was tested for the following markers: CA-125. Results of the tumor marker test revealed 37.2.   10/15/2020 Tumor Marker   Patient's tumor was tested for the following markers: CA-125. Results of the tumor marker test revealed 48   10/22/2020 Tumor Marker   Patient's tumor was tested for the following markers: CA-125 Results of the tumor marker test revealed 28.5   11/05/2020 Tumor Marker   Patient's tumor was tested for the following markers: CA-125 Results of the tumor marker test revealed 18.9   12/17/2020 Imaging   1. No significant change in soft tissue thickening and nodularity in the low pelvis, with minimal thickening of the peritoneum throughout the abdomen. 2. Similar appearance of soft tissue thickening about the mid small bowel in the central, ventral abdomen. 3. Findings are consistent with stable peritoneal metastatic disease. 4. Status post hysterectomy and cholecystectomy.   12/18/2020 Tumor Marker   Patient's tumor was tested for the following markers: CA-125 Results of the tumor marker test revealed 10.8   01/11/2021 Imaging   Findings concerning for small bowel obstruction.     01/11/2021 Tumor Marker  Patient's tumor was tested for the following markers: CA-125. Results of the tumor marker test revealed 10.4.   01/11/2021 Imaging   CT abdomen and pelvis Mildly prominent proximal loops of small bowel although no true transition zone is seen in the distal small bowel appears within normal limits. These changes  could represent early partial small bowel obstruction. The overall appearance however is similar to that seen on prior CT from May of 2022.   Mild peritoneal thickening similar to that seen on the prior exam.   Fatty liver.   No other focal abnormality is noted.     Interval History: The patient presents today for follow-up.  She saw Dr. Dema Severin earlier this week and after her last visit with me had developed a firm and tender collection underneath her incision that was warm to the touch.  Her incision was opened and a second smaller incision was made with drainage of some purulent fluid.  The patient was placed on a course of Augmentin.  She presents today stating that overall she is doing much better.  She notes that her purulent drainage became serosanguineous and is now back to yellow/green in color.  She denies any pain around her incision, fluid collections, or warmth to her skin.  She is having a little bit to eat when she takes her antibiotics.  Her nausea has significantly improved and mostly resolved with starting her GI prophylaxis.  She is having daily bowel function, mostly that is unformed.  Past Medical/Surgical History: Past Medical History:  Diagnosis Date   Anemia due to antineoplastic chemotherapy    Arthritis    Asthma due to seasonal allergies    01-21-2021 per pt seasonal and exercised induced, does not have inhaler, last used one few years   Chronic abdominal pain 01/2021   due to partial bowel obstruction   Chronic constipation 01/2021   due to partial bowel obstruction   Chronic nausea    01-21-2021  per pt intermittant nausea and when has abd pain most of time will have vomiting   Family history of breast cancer    Family history of colon cancer    Family history of ovarian cancer    Family history of prostate cancer    History of 2019 novel coronavirus disease (COVID-19) 09/08/2020   positive results in epic, per pt mild symptoms that resolved   History of  Helicobacter pylori infection 07/2016   E[facility] w/ bx's by [facility],  chronic h.pylori w/ gastritis,  treated  (no ulcer)   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;  progression w/ abdominal carcinomatosis   Partial bowel obstruction ([place]) 01/2021   Steroid-induced diabetes ([place])    followed by dr Alvy Bimler    Past Surgical History:  Procedure Laterality Date   CHOLECYSTECTOMY N/A 02/16/2015   Procedure: LAPAROSCOPIC CHOLECYSTECTOMY WITH INTRAOPERATIVE CHOLANGIOGRAM;  Surgeon: Johnathan Hausen, MD;  Location: [facility];  Service: General;  Laterality: N/A;   INCISION AND DRAINAGE ABSCESS N/A 02/02/2021   Procedure: INCISION AND DRAINAGE ABSCESS;  Surgeon: Lahoma Crocker, MD;  Location: [facility];  Service: Gynecology;  Laterality: N/A;   IR IMAGING GUIDED PORT INSERTION  08/12/2019   LAPAROSCOPY N/A 01/23/2021   Procedure: LAPAROSCOPY DIAGNOSTIC;  Surgeon: Lafonda Mosses, MD;  Location: [facility];  Service: Gynecology;  Laterality: N/A;   LAPAROTOMY N/A 01/23/2021   Procedure: LAPAROTOMY,  ENTEROTOMY REPAIR OF THE BOWEL;  Surgeon: Lafonda Mosses, MD;  Location: [facility];  Service: Gynecology;  Laterality: N/A;   SALPINGOOPHORECTOMY N/A 07/21/2019   Procedure: EXPLORATORY LAPAROTOMY, TOTAL ABDOMINAL HYSTERECTOMY, BILATERAL SALPINGO OOPHORECTOMY, OMENTECTOMY;  Surgeon: Lafonda Mosses, MD;  Location: [facility];  Service: Gynecology;  Laterality: N/A;   UPPER GASTROINTESTINAL ENDOSCOPY  08/01/2016   by dr Corinda Gubler in epic    Family History  Problem Relation Age of Onset   Colon cancer Father 61   Prostate cancer Father 59   Colon cancer Paternal Aunt        dx. early 28s   Ovarian cancer Paternal Grandmother 48   Breast cancer Cousin    Endometriosis Mother    Cancer Paternal Uncle        unknown type, dx. early 96s    Cancer Paternal Uncle 19       unknown type   Esophageal cancer Neg Hx     Social History   Socioeconomic History   Marital status: Married    Spouse name: Herbie [place]   Number of children: 2   Years of education: Not on file   Highest education level: Not on file  Occupational History   Not on file  Tobacco Use   Smoking status: Never   Smokeless tobacco: Never  Vaping Use   Vaping Use: Never used  Substance and Sexual Activity   Alcohol use: Yes    Comment: Social drinker   Drug use: Never   Sexual activity: Yes    Birth control/protection: Surgical  Other Topics Concern   Not on file  Social History Narrative   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  Newly married as of October 2020.   2 boys, age 54 and 80   Social Determinants of Health   Emergency planning/management officer Strain: Not on file  Food Insecurity: Not on file  Transportation Needs: Not on file  Physical Activity: Not on file  Stress: Not on file  Social Connections: Not on file    Current Medications:  Current Outpatient Medications:    Accu-Chek Softclix Lancets lancets, Use as instructed, Disp: 100 each, Rfl: 12   acetaminophen (TYLENOL) 650 MG suppository, Place 1 suppository (650 mg total) rectally every 6 (six) hours as needed for moderate pain., Disp: 15 suppository, Rfl: 2   amoxicillin-clavulanate (AUGMENTIN) 875-125 MG tablet, Take 1 tablet by mouth every 12 (twelve) hours for 7 days, Disp: 14 tablet, Rfl: 0   glucose blood test strip, Use as instructed, Disp: 100 each, Rfl: 12   HYDROmorphone HCl (DILAUDID) 1 MG/ML LIQD, Take 1 mL (1 mg total) by mouth every 4 (four) hours as needed for severe pain or moderate pain., Disp: 30 mL, Rfl: 0   insulin aspart (NOVOLOG) 100 UNIT/ML injection, Inject 0-15 Units into the skin 4 (four) times daily. Correction coverage: Moderate (average weight, post-op) CBG < 70: Implement Hypoglycemia Standing Orders and refer to Hypoglycemia Standing Orders sidebar report CBG  70 - 120: 0 units CBG 121 - 150: 2 units CBG 151 - 200: 3 units CBG 201 - 250: 5 units CBG 251 - 300: 8 units CBG 301 - 350: 11 units CBG 351 - 400: 15 units, Disp: 60 mL, Rfl: 11   lidocaine-prilocaine (EMLA) cream, APPLY TO THE AFFECTED AREA AS DIRECTED TO ACCESS PORT, Disp: 30 g, Rfl: 11   LORazepam (ATIVAN) 2 MG/ML concentrated solution, Take 0.3 mLs (0.6 mg total) by mouth every 6 (  six) hours as needed for up to 20 doses for anxiety or sleep., Disp: 6 mL, Rfl: 0   omeprazole (PRILOSEC) 40 MG capsule, Take 1 capsule (40 mg total) by mouth daily., Disp: 30 capsule, Rfl: 3   ondansetron (ZOFRAN ODT) 4 MG disintegrating tablet, Take 1 tablet (4 mg total) by mouth every 4 (four) hours as needed for up to 20 doses for nausea or vomiting., Disp: 20 tablet, Rfl: 0   promethazine (PHENERGAN) 12.5 MG suppository, Place 1 suppository (12.5 mg total) rectally every 8 (eight) hours as needed for up to 30 doses for nausea or vomiting., Disp: 30 each, Rfl: 0  Review of Systems: Denies appetite changes, fevers, chills, fatigue, unexplained weight changes. Denies hearing loss, neck lumps or masses, mouth sores, ringing in ears or voice changes. Denies cough or wheezing.  Denies shortness of breath. Denies chest pain or palpitations. Denies leg swelling. Denies abdominal distention, pain, blood in stools, constipation, diarrhea, nausea, vomiting, or early satiety. Denies pain with intercourse, dysuria, frequency, hematuria or incontinence. Denies hot flashes, pelvic pain, vaginal bleeding or vaginal discharge.   Denies joint pain, back pain or muscle pain/cramps. Denies itching, rash, or wounds. Denies dizziness, headaches, numbness or seizures. Denies swollen lymph nodes or glands, denies easy bruising or bleeding. Denies anxiety, depression, confusion, or decreased concentration.  Physical Exam: BP 101/70 (BP Location: Left Arm, Patient Position: Sitting)   Pulse 83   Temp 97.8 F (36.6 C) (Tympanic)    Resp 18   Ht 5' 2" (1.575 m)   Wt 193 lb 11.2 oz (87.9 kg)   LMP 07/05/2019   SpO2 100%   BMI 35.43 kg/m  General: Alert, oriented, no acute distress. HEENT: Normocephalic, atraumatic, sclera anicteric. Chest: Unlabored breathing on room air Cardiovascular: Regular rate and rhythm, no murmurs. Abdomen: Obese, soft, nontender.  Normoactive bowel sounds.  No masses or hepatosplenomegaly appreciated.  Bandages taken off.  There is enteric colored drainage on her bandage.  The larger incision is approximately 1 inch in size, smaller incision about 1 cm in along a portion of her old laparotomy incision.  Both incisions are probed with no purulent or enteric drainage noted.  There is some mild induration that I think is scar tissue that is nonfluctuant and not warm to the touch between the 2 incision sites. Extremities: Grossly normal range of motion.  Warm, well perfused.  No edema bilaterally.  Laboratory & Radiologic Studies: None new  Assessment & Plan: Sarah Newman is a 45 y.o. 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  The patient developed an EC fistula which has been managed with bowel rest and TPN.  No evidence of infection today.  I am somewhat concerned that her larger incision has closed enough that with continued output from her fistula, she may recollect and develop a subcutaneous infection again.  I have recommended that in the next couple of days, we plan an outpatient procedure for opening the incision, exploration and drainage of any pockets found, and likely reapplication of an Eakin pouch.  While her fistula seems to have decreased both in output as well  as in appearance on CT imaging, her CT recently showed persistence of the fistula.  We discussed the importance of  having site for drainage if there is continued enteric output into the subcutaneous tissue so that it does not collect and cause infection and pain.  Patient is amenable to proceeding with scheduling surgery.  We will plan for this on Monday.  32 minutes of total time was spent for this patient encounter, including preparation, face-to-face counseling with the patient and coordination of care, and documentation of the encounter.  Jeral Pinch, MD  Division of Gynecologic Oncology  Department of Obstetrics and Gynecology  [facility]

## 2021-03-08 NOTE — Progress Notes (Signed)
Spoke w/ via phone for pre-op interview--- Pt Lab needs dos----  no             Lab results------ pt has lab results in epic, CMP done 03-08-2021,  CBCdiff done 02-27-2021, and current ekg in epic/ chart COVID test -----patient states asymptomatic no test needed Arrive at -------  1000 on 03-11-2021 NPO after MN NO Solid Food.  Clear liquids from MN until---  0900 Med rec completed Medications to take morning of surgery ----- Prilosec Diabetic medication ----- no insulin morning of surgery Patient instructed no nail polish to be worn day of surgery Patient instructed to bring photo id and insurance card day of surgery Patient aware to have Driver (ride ) / caregiver for 24 hours after surgery --husband, Sarah Newman  Patient Special Instructions ----- pt verbalized understanding of instructions given and relayed to her from Sarah Newman to stop and disconnect TPN just before leaving her house to arrive dos and clear liquids from midnight to [place]. Pre-Op special Istructions ----- n/a Patient verbalized understanding of instructions that were given at this phone interview. Patient denies shortness of breath, chest pain, fever, cough at this phone interview.

## 2021-03-08 NOTE — Progress Notes (Signed)
Patient flushed her port with saline and heparin this morning so no heparin needed in flush

## 2021-03-08 NOTE — Patient Instructions (Signed)
Preparing for your Surgery  Plan for surgery on March 11, 2021 with Dr. Jeral Pinch at [facility]. You will be scheduled for opening and exploration of abdominal incision.   Pre-operative Testing -You will receive a phone call from presurgical testing at [facility] to discuss surgery instructions and arrange for lab work if needed.  -Bring your insurance card, copy of an advanced directive if applicable, medication list.  -You should not be taking blood thinners or aspirin at least ten days prior to surgery unless instructed by your surgeon.  -Do not take supplements such as fish oil (omega 3), red yeast rice, turmeric before your surgery. You want to avoid medications with aspirin in them including headache powders such as BC or Goody's), Excedrin migraine.  Day Before Surgery at [place] will be advised you can have clear liquids up until 3 hours before your surgery.    Your role in recovery Your role is to become active as soon as directed by your doctor, while still giving yourself time to heal.  Rest when you feel tired. You will be asked to do the following in order to speed your recovery:  - Cough and breathe deeply. This helps to clear and expand your lungs and can prevent pneumonia after surgery.  - [place]. Do mild physical activity. Walking or moving your legs help your circulation and body functions return to normal. Do not try to get up or walk alone the first time after surgery.   -If you develop swelling on one leg or the other, pain in the back of your leg, redness/warmth in one of your legs, please call the office or go to the Emergency Room to have a doppler to rule out a blood clot. For shortness of breath, chest pain-seek care in the Emergency Room as soon as possible. - Actively manage your pain. Managing your pain lets you move in comfort. We will ask you to rate your pain on a scale of zero to 10. It  is your responsibility to tell your doctor or nurse where and how much you hurt so your pain can be treated.  Special Considerations -Your final pathology results from surgery should be available around one week after surgery and the results will be relayed to you when available.  -FMLA forms can be faxed to [phone] and please allow 5-7 business days for completion.  Pain Management After Surgery Continue with current home regimen  Risks of Surgery Risks of surgery are low but include bleeding, infection, damage to surrounding structures, re-operation, blood clots, and very rarely death.  AFTER SURGERY INSTRUCTIONS  Activity: 1. Be up and out of the bed during the day.  Take a nap if needed.  You may walk up steps but be careful and use the hand rail.  Stair climbing will tire you more than you think, you may need to stop part way and rest.   2. No lifting or straining for 2 weeks over 10 pounds. No pushing, pulling, straining for 2 weeks.  3. No driving for minimum 24 hours after surgery.  Do not drive if you are taking narcotic pain medicine and make sure that your reaction time has returned.   4. You can shower as soon as the next day after surgery. Shower daily. No tub baths or submerging your body in water until cleared by your surgeon.   Diet: 1. Low sodium Heart Healthy Diet is recommended but  you are cleared to resume your normal (before surgery) diet after your procedure.  Wound Care: 1. Keep clean and dry.  Shower daily.  Reasons to call the Doctor: Fever - Oral temperature greater than 100.4 degrees Fahrenheit Foul-smelling vaginal discharge Difficulty urinating Nausea and vomiting Increased pain at the site of the incision that is unrelieved with pain medicine. Difficulty breathing with or without chest pain New calf pain especially if only on one side Sudden, continuing increased vaginal bleeding with or without clots.   Contacts: For questions or concerns  you should contact:  Dr. Jeral Pinch at [phone]  Joylene John, NP at [phone]  After Hours: call [phone] and have the GYN Oncologist paged/contacted (after 5 pm or on the weekends).  Messages sent via mychart are for non-urgent matters and are not responded to after hours so for urgent needs, please call the after hours number.

## 2021-03-11 ENCOUNTER — Encounter (HOSPITAL_BASED_OUTPATIENT_CLINIC_OR_DEPARTMENT_OTHER)
Admission: RE | Disposition: A | Discharge status: Home / Self Care | Payer: Self-pay | Source: Home / Self Care | Attending: Gynecologic Oncology

## 2021-03-11 ENCOUNTER — Ambulatory Visit (HOSPITAL_BASED_OUTPATIENT_CLINIC_OR_DEPARTMENT_OTHER)
Admission: RE | Admit: 2021-03-11 | Discharge: 2021-03-11 | Disposition: A | Discharge status: Home / Self Care | Payer: Medicaid Other | Attending: Gynecologic Oncology | Admitting: Gynecologic Oncology

## 2021-03-11 ENCOUNTER — Other Ambulatory Visit (HOSPITAL_COMMUNITY): Payer: Self-pay

## 2021-03-11 ENCOUNTER — Other Ambulatory Visit: Payer: Self-pay

## 2021-03-11 ENCOUNTER — Ambulatory Visit (HOSPITAL_BASED_OUTPATIENT_CLINIC_OR_DEPARTMENT_OTHER): Payer: Medicaid Other | Admitting: Anesthesiology

## 2021-03-11 ENCOUNTER — Encounter (HOSPITAL_BASED_OUTPATIENT_CLINIC_OR_DEPARTMENT_OTHER): Payer: Self-pay | Admitting: Gynecologic Oncology

## 2021-03-11 ENCOUNTER — Other Ambulatory Visit: Payer: Self-pay | Admitting: Oncology

## 2021-03-11 ENCOUNTER — Telehealth: Payer: Self-pay | Admitting: Oncology

## 2021-03-11 DIAGNOSIS — Z90722 Acquired absence of ovaries, bilateral: Secondary | ICD-10-CM | POA: Diagnosis not present

## 2021-03-11 DIAGNOSIS — K632 Fistula of intestine: Secondary | ICD-10-CM | POA: Diagnosis present

## 2021-03-11 DIAGNOSIS — Z8616 Personal history of COVID-19: Secondary | ICD-10-CM | POA: Insufficient documentation

## 2021-03-11 DIAGNOSIS — Z79899 Other long term (current) drug therapy: Secondary | ICD-10-CM | POA: Diagnosis not present

## 2021-03-11 DIAGNOSIS — C562 Malignant neoplasm of left ovary: Secondary | ICD-10-CM | POA: Diagnosis not present

## 2021-03-11 HISTORY — DX: Presence of other vascular implants and grafts: Z95.828

## 2021-03-11 HISTORY — PX: WOUND DEBRIDEMENT: SHX247

## 2021-03-11 LAB — POCT I-STAT, CHEM 8
BUN: 11 mg/dL (ref 6–20)
Calcium, Ion: 1.24 mmol/L (ref 1.15–1.40)
Chloride: 100 mmol/L (ref 98–111)
Creatinine, Ser: 0.5 mg/dL (ref 0.44–1.00)
Glucose, Bld: 156 mg/dL — ABNORMAL HIGH (ref 70–99)
HCT: 35 % — ABNORMAL LOW (ref 36.0–46.0)
Hemoglobin: 11.9 g/dL — ABNORMAL LOW (ref 12.0–15.0)
Potassium: 3.9 mmol/L (ref 3.5–5.1)
Sodium: 137 mmol/L (ref 135–145)
TCO2: 24 mmol/L (ref 22–32)

## 2021-03-11 LAB — GLUCOSE, CAPILLARY: Glucose-Capillary: 132 mg/dL — ABNORMAL HIGH (ref 70–99)

## 2021-03-11 SURGERY — DEBRIDEMENT, WOUND, ABDOMEN
Anesthesia: General | Site: Abdomen

## 2021-03-11 MED ORDER — CELECOXIB 200 MG PO CAPS
200.0000 mg | ORAL_CAPSULE | Freq: Once | ORAL | Status: AC
  Administered 2021-03-11: 200 mg via ORAL

## 2021-03-11 MED ORDER — PROPOFOL 10 MG/ML IV BOLUS
INTRAVENOUS | Status: AC
  Filled 2021-03-11: qty 20

## 2021-03-11 MED ORDER — DEXAMETHASONE SODIUM PHOSPHATE 10 MG/ML IJ SOLN
INTRAMUSCULAR | Status: DC | PRN
  Administered 2021-03-11 (×2): 5 mg via INTRAVENOUS

## 2021-03-11 MED ORDER — CLINDAMYCIN PHOSPHATE 900 MG/50ML IV SOLN
INTRAVENOUS | Status: AC
  Filled 2021-03-11: qty 50

## 2021-03-11 MED ORDER — FENTANYL CITRATE (PF) 100 MCG/2ML IJ SOLN
INTRAMUSCULAR | Status: AC
  Filled 2021-03-11: qty 2

## 2021-03-11 MED ORDER — PHENYLEPHRINE 40 MCG/ML (10ML) SYRINGE FOR IV PUSH (FOR BLOOD PRESSURE SUPPORT)
PREFILLED_SYRINGE | INTRAVENOUS | Status: AC
  Filled 2021-03-11: qty 10

## 2021-03-11 MED ORDER — SCOPOLAMINE 1 MG/3DAYS TD PT72
MEDICATED_PATCH | TRANSDERMAL | Status: AC
  Filled 2021-03-11: qty 1

## 2021-03-11 MED ORDER — DEXAMETHASONE SODIUM PHOSPHATE 10 MG/ML IJ SOLN: 4.0000 mg | INTRAMUSCULAR | Status: DC

## 2021-03-11 MED ORDER — ONDANSETRON HCL 4 MG/2ML IJ SOLN
INTRAMUSCULAR | Status: DC | PRN
  Administered 2021-03-11: 4 mg via INTRAVENOUS

## 2021-03-11 MED ORDER — PROMETHAZINE HCL 25 MG/ML IJ SOLN: 6.2500 mg | INTRAMUSCULAR | Status: DC | PRN

## 2021-03-11 MED ORDER — ACETAMINOPHEN 500 MG PO TABS
ORAL_TABLET | ORAL | Status: AC
  Filled 2021-03-11: qty 2

## 2021-03-11 MED ORDER — ACETAMINOPHEN 500 MG PO TABS
1000.0000 mg | ORAL_TABLET | Freq: Once | ORAL | Status: AC
  Administered 2021-03-11: 1000 mg via ORAL

## 2021-03-11 MED ORDER — CLINDAMYCIN PHOSPHATE 900 MG/50ML IV SOLN
900.0000 mg | INTRAVENOUS | Status: AC
  Administered 2021-03-11: 900 mg via INTRAVENOUS

## 2021-03-11 MED ORDER — LIDOCAINE HCL (PF) 2 % IJ SOLN
INTRAMUSCULAR | Status: AC
  Filled 2021-03-11: qty 5

## 2021-03-11 MED ORDER — AMISULPRIDE (ANTIEMETIC) 5 MG/2ML IV SOLN: 10.0000 mg | Freq: Once | INTRAVENOUS | Status: DC | PRN

## 2021-03-11 MED ORDER — PROPOFOL 10 MG/ML IV BOLUS
INTRAVENOUS | Status: DC | PRN
  Administered 2021-03-11 (×4): 10 mg via INTRAVENOUS
  Administered 2021-03-11: 200 mg via INTRAVENOUS

## 2021-03-11 MED ORDER — BUPIVACAINE HCL 0.25 % IJ SOLN
INTRAMUSCULAR | Status: DC | PRN
  Administered 2021-03-11: 15 mL

## 2021-03-11 MED ORDER — CELECOXIB 200 MG PO CAPS
ORAL_CAPSULE | ORAL | Status: AC
  Filled 2021-03-11: qty 1

## 2021-03-11 MED ORDER — ACETAMINOPHEN 500 MG PO TABS: 1000.0000 mg | ORAL_TABLET | ORAL | Status: AC

## 2021-03-11 MED ORDER — FENTANYL CITRATE (PF) 100 MCG/2ML IJ SOLN: 25.0000 ug | INTRAMUSCULAR | Status: DC | PRN

## 2021-03-11 MED ORDER — LIDOCAINE 2% (20 MG/ML) 5 ML SYRINGE
INTRAMUSCULAR | Status: DC | PRN
  Administered 2021-03-11: 100 mg via INTRAVENOUS

## 2021-03-11 MED ORDER — ONDANSETRON HCL 4 MG/2ML IJ SOLN
INTRAMUSCULAR | Status: AC
  Filled 2021-03-11: qty 2

## 2021-03-11 MED ORDER — MIDAZOLAM HCL 5 MG/5ML IJ SOLN
INTRAMUSCULAR | Status: DC | PRN
  Administered 2021-03-11: 2 mg via INTRAVENOUS

## 2021-03-11 MED ORDER — MIDAZOLAM HCL 2 MG/2ML IJ SOLN
INTRAMUSCULAR | Status: AC
  Filled 2021-03-11: qty 2

## 2021-03-11 MED ORDER — LACTATED RINGERS IV SOLN: INTRAVENOUS | Status: DC

## 2021-03-11 MED ORDER — SCOPOLAMINE 1 MG/3DAYS TD PT72
1.0000 | MEDICATED_PATCH | TRANSDERMAL | Status: DC
  Administered 2021-03-11: 1.5 mg via TRANSDERMAL

## 2021-03-11 MED ORDER — SCOPOLAMINE 1 MG/3DAYS TD PT72: 1.0000 | MEDICATED_PATCH | TRANSDERMAL | Status: DC

## 2021-03-11 MED ORDER — FENTANYL CITRATE (PF) 100 MCG/2ML IJ SOLN
INTRAMUSCULAR | Status: DC | PRN
  Administered 2021-03-11 (×2): 50 ug via INTRAVENOUS

## 2021-03-11 MED ORDER — DEXAMETHASONE SODIUM PHOSPHATE 10 MG/ML IJ SOLN
INTRAMUSCULAR | Status: AC
  Filled 2021-03-11: qty 1

## 2021-03-11 MED ORDER — PHENYLEPHRINE 40 MCG/ML (10ML) SYRINGE FOR IV PUSH (FOR BLOOD PRESSURE SUPPORT)
PREFILLED_SYRINGE | INTRAVENOUS | Status: DC | PRN
  Administered 2021-03-11 (×7): 80 ug via INTRAVENOUS

## 2021-03-11 SURGICAL SUPPLY — 75 items
ADH SKN CLS APL DERMABOND .7 (GAUZE/BANDAGES/DRESSINGS)
AGENT HMST KT MTR STRL THRMB (HEMOSTASIS)
APL PRP STRL LF DISP 70% ISPRP (MISCELLANEOUS) ×1
APL SWBSTK 6 STRL LF DISP (MISCELLANEOUS) ×2
APPLICATOR COTTON TIP 6 STRL (MISCELLANEOUS) ×2 IMPLANT
APPLICATOR COTTON TIP 6IN STRL (MISCELLANEOUS) ×4 IMPLANT
ATTRACTOMAT 16X20 MAGNETIC DRP (DRAPES) IMPLANT
BACTOSHIELD CHG 4% 4OZ (MISCELLANEOUS)
BIT DRILL 2.6 CANN (BIT) IMPLANT
BLADE EXTENDED COATED 6.5IN (ELECTRODE) IMPLANT
CELLS DAT CNTRL 66122 CELL SVR (MISCELLANEOUS) IMPLANT
CHLORAPREP W/TINT 26 (MISCELLANEOUS) ×2 IMPLANT
CLIP TI LARGE 6 (CLIP) IMPLANT
CLIP TI MEDIUM 6 (CLIP) IMPLANT
CLIP TI MEDIUM LARGE 6 (CLIP) IMPLANT
CNTNR URN SCR LID CUP LEK RST (MISCELLANEOUS) IMPLANT
CONT SPEC 4OZ STRL OR WHT (MISCELLANEOUS)
DERMABOND ADVANCED (GAUZE/BANDAGES/DRESSINGS)
DERMABOND ADVANCED .7 DNX12 (GAUZE/BANDAGES/DRESSINGS) IMPLANT
DRAPE INCISE IOBAN 66X45 STRL (DRAPES) IMPLANT
DRAPE SURG IRRIG POUCH 19X23 (DRAPES) IMPLANT
DRAPE WARM FLUID 44X44 (DRAPES) IMPLANT
DRSG OPSITE POSTOP 4X10 (GAUZE/BANDAGES/DRESSINGS) IMPLANT
DRSG OPSITE POSTOP 4X6 (GAUZE/BANDAGES/DRESSINGS) IMPLANT
DRSG OPSITE POSTOP 4X8 (GAUZE/BANDAGES/DRESSINGS) IMPLANT
DRSG PAD ABDOMINAL 8X10 ST (GAUZE/BANDAGES/DRESSINGS) ×2 IMPLANT
ELECT REM PT RETURN 9FT ADLT (ELECTROSURGICAL) ×2
ELECTRODE REM PT RTRN 9FT ADLT (ELECTROSURGICAL) ×1 IMPLANT
Eakin Wound Pouch ×2 IMPLANT
GAUZE 4X4 16PLY ~~LOC~~+RFID DBL (SPONGE) ×2 IMPLANT
GAUZE PACKING 1 X5 YD ST (GAUZE/BANDAGES/DRESSINGS) ×2 IMPLANT
GLOVE SURG ENC MOIS LTX SZ6 (GLOVE) ×4 IMPLANT
GLOVE SURG ENC MOIS LTX SZ6.5 (GLOVE) ×4 IMPLANT
GOWN STRL REUS W/TWL LRG LVL3 (GOWN DISPOSABLE) ×4 IMPLANT
HEMOSTAT ARISTA ABSORB 3G PWDR (HEMOSTASIS) IMPLANT
KIT BASIN OR (CUSTOM PROCEDURE TRAY) ×2 IMPLANT
KIT TURNOVER CYSTO (KITS) ×2 IMPLANT
LIGASURE IMPACT 36 18CM CVD LR (INSTRUMENTS) IMPLANT
LOOP VESSEL MAXI BLUE (MISCELLANEOUS) IMPLANT
NEEDLE HYPO 22GX1.5 SAFETY (NEEDLE) ×4 IMPLANT
NS IRRIG 1000ML POUR BTL (IV SOLUTION) ×2 IMPLANT
PACK ABDOMINAL GYN (CUSTOM PROCEDURE TRAY) ×2 IMPLANT
PENCIL SMOKE EVACUATOR (MISCELLANEOUS) ×2 IMPLANT
RELOAD PROXIMATE 75MM BLUE (ENDOMECHANICALS) IMPLANT
RELOAD PROXIMATE TA60MM BLUE (ENDOMECHANICALS) IMPLANT
RETRACTOR WND ALEXIS 25 LRG (MISCELLANEOUS) IMPLANT
RTRCTR WOUND ALEXIS 18CM MED (MISCELLANEOUS)
RTRCTR WOUND ALEXIS 25CM LRG (MISCELLANEOUS)
SCRUB CHG 4% DYNA-HEX 4OZ (MISCELLANEOUS) IMPLANT
SHEET LAVH (DRAPES) IMPLANT
SLEEVE SUCTION CATH 165 (SLEEVE) IMPLANT
SPONGE T-LAP 18X18 ~~LOC~~+RFID (SPONGE) ×2 IMPLANT
STAPLER GUN LINEAR PROX 60 (STAPLE) IMPLANT
STAPLER PROXIMATE 75MM BLUE (STAPLE) IMPLANT
STAPLER VISISTAT 35W (STAPLE) IMPLANT
SURGIFLO W/THROMBIN 8M KIT (HEMOSTASIS) IMPLANT
SUT MNCRL AB 4-0 PS2 18 (SUTURE) IMPLANT
SUT PDS AB 1 TP1 96 (SUTURE) IMPLANT
SUT SILK 3 0 SH CR/8 (SUTURE) IMPLANT
SUT VIC AB 0 CT1 36 (SUTURE) IMPLANT
SUT VIC AB 2-0 CT1 36 (SUTURE) IMPLANT
SUT VIC AB 2-0 CT2 27 (SUTURE) IMPLANT
SUT VIC AB 2-0 SH 27 (SUTURE)
SUT VIC AB 2-0 SH 27X BRD (SUTURE) IMPLANT
SUT VIC AB 3-0 CTX 36 (SUTURE) IMPLANT
SUT VIC AB 3-0 SH 18 (SUTURE) IMPLANT
SUT VIC AB 3-0 SH 27 (SUTURE)
SUT VIC AB 3-0 SH 27X BRD (SUTURE) IMPLANT
SYR 30ML LL (SYRINGE) IMPLANT
SYR CONTROL 10ML LL (SYRINGE) ×2 IMPLANT
TAPE CLOTH SURG 4X10 WHT LF (GAUZE/BANDAGES/DRESSINGS) ×2 IMPLANT
TOWEL OR 17X26 10 PK STRL BLUE (TOWEL DISPOSABLE) ×2 IMPLANT
TOWEL OR NON WOVEN STRL DISP B (DISPOSABLE) IMPLANT
TRAY FOLEY MTR SLVR 16FR STAT (SET/KITS/TRAYS/PACK) IMPLANT
UNDERPAD 30X36 HEAVY ABSORB (UNDERPADS AND DIAPERS) IMPLANT

## 2021-03-11 NOTE — OR Nursing (Signed)
IV Nurse to OR to access [place] a cath; accessed in [facility] OR #3, flushed and redressed.

## 2021-03-11 NOTE — Telephone Encounter (Signed)
Called Sarah Newman with [facility] and advised that Sarah Newman has accidentally de-accessed her port twice with TPN infusing.  She is going to talk to her nurse to give Sarah Newman recommendations on keeping this from happening again.

## 2021-03-11 NOTE — Transfer of Care (Signed)
Immediate Anesthesia Transfer of Care Note  Patient: Sarah Newman  Procedure(s) Performed: OPENING AND EXPLORATION OF ABDOMINAL EXCISION (Abdomen)  Patient Location: PACU  Anesthesia Type:General  Level of Consciousness: awake, alert  and patient cooperative  Airway & Oxygen Therapy: Patient Spontanous Breathing and Patient connected to nasal cannula oxygen  Post-op Assessment: Report given to RN and Post -op Vital signs reviewed and stable  Post vital signs: Reviewed and stable  Last Vitals:  Vitals Value Taken Time  BP    Temp    Pulse 101 03/11/21 1232  Resp    SpO2 98 % 03/11/21 1232  Vitals shown include unvalidated device data.  Last Pain:  Vitals:   03/11/21 1028  TempSrc: Oral  PainSc: 0-No pain         Complications: No notable events documented.

## 2021-03-11 NOTE — Discharge Instructions (Addendum)
Today Dr. Berline Lopes assessed/explored two areas on your abdomen. She placed packing in these areas.   She recommends that you pack the two areas loosely once a day and keep a dry dressing on top of the packing.  03/11/2021  Activity: 1. Be up and out of the bed during the day.  Take a nap if needed.  You may walk up steps but be careful and use the hand rail.  Stair climbing will tire you more than you think, you may need to stop part way and rest.   2. No lifting or straining over 10 lbs, pushing, pulling, straining for 6 weeks from your original surgery.  3. No driving for minimum of 24 hours after the procedure.  Do not drive if you are taking narcotic pain medicine. You need to make sure your reaction time has returned and you can brake safely.  4. Shower daily.  Use your regular soap to bathe and when finished pat your incision dry; don't rub.  No tub baths until cleared by your surgeon.   5. You may experience a small amount of drainage/light bleeding from your incisions, which is normal.  If the drainage persists or increases, please call the office.  6. Continue on your home pain regimen.  Diet: 1. Low sodium Heart Healthy Diet is recommended.  Wound Care: 1. Keep clean and dry.  Shower daily.  Reasons to call the Doctor: Fever - Oral temperature greater than 100.4 degrees Fahrenheit Foul-smelling vaginal discharge Difficulty urinating Nausea and vomiting Increased pain at the site of the incision that is unrelieved with pain medicine. Difficulty breathing with or without chest pain New calf pain especially if only on one side Sudden, continuing increased vaginal bleeding with or without clots.   Contacts: For questions or concerns you should contact:  Dr. Jeral Pinch at [phone]  Joylene John, NP at [phone]  After Hours: call [phone] and have the GYN Oncologist paged/contacted   Post Anesthesia Home Care Instructions  Activity: Get plenty of  rest for the remainder of the day. A responsible individual must stay with you for 24 hours following the procedure.  For the next 24 hours, DO NOT: -Drive a car -Paediatric nurse -Drink alcoholic beverages -Take any medication unless instructed by your physician -Make any legal decisions or sign important papers.  Meals: Start with liquid foods such as gelatin or soup. Progress to regular foods as tolerated. Avoid greasy, spicy, heavy foods. If nausea and/or vomiting occur, drink only clear liquids until the nausea and/or vomiting subsides. Call your physician if vomiting continues.  Special Instructions/Symptoms: Your throat may feel dry or sore from the anesthesia or the breathing tube placed in your throat during surgery. If this causes discomfort, gargle with warm salt water. The discomfort should disappear within 24 hours.  If you had a scopolamine patch placed behind your ear for the management of post- operative nausea and/or vomiting:  1. The medication in the patch is effective for 72 hours, after which it should be removed.  Wrap patch in a tissue and discard in the trash. Wash hands thoroughly with soap and water. 2. You may remove the patch earlier than 72 hours if you experience unpleasant side effects which may include dry mouth, dizziness or visual disturbances. 3. Avoid touching the patch. Wash your hands with soap and water after contact with the patch.  Remove patch behind right ear by Thursday, March 14, 2021.   Take Tylenol at 5 PM as needed for pain.  Take Ibuprofen at 7 PM as needed for pain.

## 2021-03-11 NOTE — Interval H&P Note (Signed)
History and Physical Interval Note:  03/11/2021 10:33 AM  Sarah Newman  has presented today for surgery, with the diagnosis of ENTEROCUTANEOUS FISTULA.  The various methods of treatment have been discussed with the patient and family. After consideration of risks, benefits and other options for treatment, the patient has consented to  Procedure(s): OPENING AND EXPLORATION OF ABDOMINAL EXCISION (N/A) as a surgical intervention.  The patient's history has been reviewed, patient examined, no change in status, stable for surgery.  I have reviewed the patient's chart and labs.  Questions were answered to the patient's satisfaction.     Lafonda Mosses

## 2021-03-11 NOTE — Anesthesia Preprocedure Evaluation (Addendum)
Anesthesia Evaluation  Patient identified by MRN, date of birth, ID band Patient awake    Reviewed: Allergy & Precautions, NPO status , Patient's Chart, lab work & pertinent test results  History of Anesthesia Complications Negative for: history of anesthetic complications  Airway Mallampati: II  TM Distance: >3 FB Neck ROM: Full    Dental no notable dental hx. (+) Dental Advisory Given   Pulmonary asthma ,    Pulmonary exam normal        Cardiovascular negative cardio ROS Normal cardiovascular exam     Neuro/Psych negative neurological ROS  negative psych ROS   GI/Hepatic negative GI ROS, Neg liver ROS,   Endo/Other  diabetes, Type 2, Oral Hypoglycemic Agents  Renal/GU      Musculoskeletal  (+) Arthritis ,   Abdominal (+) + obese,   Peds  Hematology negative hematology ROS (+)   Anesthesia Other Findings   Reproductive/Obstetrics                            Anesthesia Physical  Anesthesia Plan  ASA: 2  Anesthesia Plan: General   Post-op Pain Management:    Induction: Intravenous  PONV Risk Score and Plan: 4 or greater and Ondansetron, Dexamethasone, Midazolam and Scopolamine patch - Pre-op  Airway Management Planned: LMA and Oral ETT  Additional Equipment: None  Intra-op Plan:   Post-operative Plan: Extubation in OR  Informed Consent: I have reviewed the patients History and Physical, chart, labs and discussed the procedure including the risks, benefits and alternatives for the proposed anesthesia with the patient or authorized representative who has indicated his/her understanding and acceptance.     Dental advisory given  Plan Discussed with: CRNA and Anesthesiologist  Anesthesia Plan Comments:        Anesthesia Quick Evaluation

## 2021-03-11 NOTE — Progress Notes (Signed)
VAST consulted to re-access right chest port in OR while patient still sedated. This am patient pulled her needle out and at home infusion leaked beneath skin. Patient guarding area as she stated it was painful.  Dr. Berline Lopes assessed around port for any pockets and stated she did not find any. Dr. Berline Lopes asked if there was any extra securement that could be used to help prevent patient from pulling needle out. Advised tubing should be looped and taped to patient to allow extra securement. Right chest port re-accessed without difficulty as charted in flowsheets and flushed with 47ms NS to ensure thorough flushing of IL and clearing of line.

## 2021-03-11 NOTE — Op Note (Addendum)
OPERATIVE NOTE  Pre-operative Diagnosis: EC fistula, healing skin incision; extravasation of TPN into subcutaneous tissue  Post-operative Diagnosis: same, no abscess noted in the subcutaneous tissue; No drainable collection of TPN noted in the subcutaneous tissue around patient's port  Operation: Abdominal wound exploration and washout; attempt at aspiration of TPN extravasation (no fluid collections identified)   Surgeon: Jeral Pinch MD  Assistant Surgeon: Joylene John NP  Anesthesia: GET  Urine Output: n/a  Operative Findings: Well healing skin incision, no drainage of succus material. Smaller second incision, 1cm in length, healed. Larger incision opened in cruciate fashion, carried down 3-4cm in the subcutaneous tissue. Wound gently explored with a digit, no fluctuance noted, no purulent drainage or fluid collections. Smaller incision opened, probed with q-tip, no fluid collections or purulence noted. 18 gauge needle placed in the subcutaneous tissue in multiple places around patient's port and no fluid noted in the subcutaneous space when syringe drawn back.  Estimated Blood Loss:  less than 50 mL      Total IV Fluids: see I&O flowsheet         Specimens: none         Complications:  None apparent; patient tolerated the procedure well.         Disposition: PACU - hemodynamically stable.  Procedure Details  The patient was seen in the Holding Room. The risks, benefits, complications, treatment options, and expected outcomes were discussed with the patient.  The patient concurred with the proposed plan, giving informed consent.  The site of surgery properly noted/marked. The patient was identified as Sarah Newman and the procedure verified as a wound revision and exploration, possible drainage if subcutaneous fluid collection of TPN identified.   After induction of anesthesia, the patient was draped and prepped in the usual sterile manner. Patient was placed in supine  position after anesthesia and draped and prepped in the usual sterile manner as follows: Her arms were tucked to her side with all appropriate precautions.  The shoulders were stabilized with padded shoulder blocks applied to the acromium processes.  The patient was placed in supine position.  The patient was draped after the CholoraPrep had been allowed to dry for 3 minutes.  A Time Out was held and the above information confirmed.  The larger incision in the left abdomen was opened further using a scalpel and monopolar electrocautery to make a cruciate incision. The subcutaneous tissue was gently explored with no clear evidence of succus or purulent fluid. The second smaller incision was also opened and explored. Some firm tissue between the two incisions was noted c/w scar tissue. Both incisions were packed wet to dry after an Eakin pouch was attempted to be placed over larger incision. Unfortunately, given proximity of second incision, a good seal was not able to be obtained with the Eakin pouch. The incisions were covered with an ABD pad.  An 18 gauge needle was then used to attempt aspiration of the subcutaneous space to assess for any collections of extravasated TPN. This was done circumferentially to the port with no fluid noted.  All sponge, lap and needle counts were correct x  3.   The patient was transferred to the recovery room in stable condition.  Jeral Pinch, MD

## 2021-03-11 NOTE — Anesthesia Procedure Notes (Signed)
Procedure Name: LMA Insertion Date/Time: 03/11/2021 11:20 AM Performed by: Rogers Blocker, CRNA Pre-anesthesia Checklist: Patient identified, Emergency Drugs available, Suction available and Patient being monitored Patient Re-evaluated:Patient Re-evaluated prior to induction Oxygen Delivery Method: Circle System Utilized Preoxygenation: Pre-oxygenation with 100% oxygen Induction Type: IV induction Ventilation: Mask ventilation without difficulty LMA: LMA inserted LMA Size: 4.0 Number of attempts: 1 Placement Confirmation: positive ETCO2 Tube secured with: Tape Dental Injury: Teeth and Oropharynx as per pre-operative assessment

## 2021-03-11 NOTE — Anesthesia Postprocedure Evaluation (Signed)
Anesthesia Post Note  Patient: Sarah Newman  Procedure(s) Performed: OPENING AND EXPLORATION OF ABDOMINAL EXCISION (Abdomen)     Patient location during evaluation: PACU Anesthesia Type: General Level of consciousness: sedated Pain management: pain level controlled Vital Signs Assessment: post-procedure vital signs reviewed and stable Respiratory status: spontaneous breathing and respiratory function stable Cardiovascular status: stable Postop Assessment: no apparent nausea or vomiting Anesthetic complications: no   No notable events documented.  Last Vitals:  Vitals:   03/11/21 1300 03/11/21 1315  BP: 115/80 106/80  Pulse: 82 87  Resp: (!) 21 20  Temp:    SpO2: 96% 97%    Last Pain:  Vitals:   03/11/21 1300  TempSrc:   PainSc: 0-No pain                 Alfrieda Tarry DANIEL

## 2021-03-12 ENCOUNTER — Other Ambulatory Visit (HOSPITAL_COMMUNITY): Payer: Self-pay

## 2021-03-12 ENCOUNTER — Other Ambulatory Visit: Payer: Self-pay | Admitting: Gynecologic Oncology

## 2021-03-12 ENCOUNTER — Telehealth: Payer: Self-pay | Admitting: Oncology

## 2021-03-12 ENCOUNTER — Encounter (HOSPITAL_BASED_OUTPATIENT_CLINIC_OR_DEPARTMENT_OTHER): Payer: Self-pay | Admitting: Gynecologic Oncology

## 2021-03-12 ENCOUNTER — Telehealth: Payer: Self-pay

## 2021-03-12 DIAGNOSIS — F418 Other specified anxiety disorders: Secondary | ICD-10-CM

## 2021-03-12 DIAGNOSIS — G4709 Other insomnia: Secondary | ICD-10-CM

## 2021-03-12 MED ORDER — LORAZEPAM 0.5 MG PO TABS
0.5000 mg | ORAL_TABLET | Freq: Three times a day (TID) | ORAL | 0 refills | Status: DC | PRN
  Filled 2021-03-12: qty 15, 5d supply, fill #0

## 2021-03-12 MED ORDER — LORAZEPAM 2 MG/ML PO CONC
0.6000 mg | Freq: Four times a day (QID) | ORAL | 0 refills | Status: DC | PRN
  Filled 2021-03-12: qty 6, 5d supply, fill #0

## 2021-03-12 NOTE — Telephone Encounter (Signed)
Advised Sarah Newman that lorazepam tablets have been sent to  [place].  She verbalized understanding.

## 2021-03-12 NOTE — Addendum Note (Signed)
Addendum  created 03/12/21 0954 by Suan Halter, CRNA   Charge Capture section accepted

## 2021-03-12 NOTE — Progress Notes (Signed)
See RN note. Refill requested on Ativan solution.

## 2021-03-12 NOTE — Telephone Encounter (Signed)
Spoke with Sarah Newman. She states she is taking in popsicles and ice chips. and urinating well. She has has had a BM.  She denies fever or chills. Incisions are 2 slightly moist to dry dressings once a day. She is not using any pain medications. Requested a refill on her Ativan which was sent in by [facility],NP. She is experiencing some yellow, slimy drainage from her vagina.  The fistula incision is draining a very small amount of pea green drainage Dr. Berline Lopes notified. Instructed to call office with any fever, chills, purulent drainage, uncontrolled pain or any other questions or concerns. Patient verbalizes understanding.  Pt aware of post op appointments as well as the office number [phone] and after hours number [phone] to call if she has any questions or concerns

## 2021-03-13 ENCOUNTER — Other Ambulatory Visit (HOSPITAL_COMMUNITY): Payer: Self-pay

## 2021-03-14 ENCOUNTER — Telehealth: Payer: Self-pay | Admitting: Oncology

## 2021-03-14 ENCOUNTER — Inpatient Hospital Stay (HOSPITAL_BASED_OUTPATIENT_CLINIC_OR_DEPARTMENT_OTHER): Payer: Medicaid Other | Admitting: Gynecologic Oncology

## 2021-03-14 ENCOUNTER — Other Ambulatory Visit (HOSPITAL_COMMUNITY): Payer: Self-pay

## 2021-03-14 ENCOUNTER — Inpatient Hospital Stay: Payer: Medicaid Other

## 2021-03-14 ENCOUNTER — Other Ambulatory Visit: Payer: Self-pay

## 2021-03-14 ENCOUNTER — Telehealth: Payer: Self-pay | Admitting: Gynecologic Oncology

## 2021-03-14 VITALS — BP 119/70 | HR 83 | Temp 98.4°F | Ht 62.0 in | Wt 197.5 lb

## 2021-03-14 DIAGNOSIS — G8918 Other acute postprocedural pain: Secondary | ICD-10-CM

## 2021-03-14 DIAGNOSIS — C562 Malignant neoplasm of left ovary: Secondary | ICD-10-CM

## 2021-03-14 DIAGNOSIS — L03311 Cellulitis of abdominal wall: Secondary | ICD-10-CM

## 2021-03-14 DIAGNOSIS — R5082 Postprocedural fever: Secondary | ICD-10-CM | POA: Insufficient documentation

## 2021-03-14 DIAGNOSIS — C762 Malignant neoplasm of abdomen: Secondary | ICD-10-CM

## 2021-03-14 DIAGNOSIS — C563 Malignant neoplasm of bilateral ovaries: Secondary | ICD-10-CM | POA: Diagnosis not present

## 2021-03-14 DIAGNOSIS — R109 Unspecified abdominal pain: Secondary | ICD-10-CM

## 2021-03-14 DIAGNOSIS — R112 Nausea with vomiting, unspecified: Secondary | ICD-10-CM

## 2021-03-14 DIAGNOSIS — K632 Fistula of intestine: Secondary | ICD-10-CM

## 2021-03-14 DIAGNOSIS — R1084 Generalized abdominal pain: Secondary | ICD-10-CM

## 2021-03-14 LAB — CMP (CANCER CENTER ONLY)
ALT: 18 U/L (ref 0–44)
AST: 19 U/L (ref 15–41)
Albumin: 3 g/dL — ABNORMAL LOW (ref 3.5–5.0)
Alkaline Phosphatase: 78 U/L (ref 38–126)
Anion gap: 10 (ref 5–15)
BUN: 16 mg/dL (ref 6–20)
CO2: 24 mmol/L (ref 22–32)
Calcium: 8.8 mg/dL — ABNORMAL LOW (ref 8.9–10.3)
Chloride: 103 mmol/L (ref 98–111)
Creatinine: 0.71 mg/dL (ref 0.44–1.00)
GFR, Estimated: >60 mL/min (ref >60)
Glucose, Bld: 137 mg/dL — ABNORMAL HIGH (ref 70–99)
Potassium: 3.9 mmol/L (ref 3.5–5.1)
Sodium: 137 mmol/L (ref 135–145)
Total Bilirubin: <0.2 mg/dL — ABNORMAL LOW (ref 0.3–1.2)
Total Protein: 7 g/dL (ref 6.5–8.1)

## 2021-03-14 LAB — CBC (CANCER CENTER ONLY)
HCT: 30.8 % — ABNORMAL LOW (ref 36.0–46.0)
Hemoglobin: 10 g/dL — ABNORMAL LOW (ref 12.0–15.0)
MCH: 27 pg (ref 26.0–34.0)
MCHC: 32.5 g/dL (ref 30.0–36.0)
MCV: 83.2 fL (ref 80.0–100.0)
Platelet Count: 223 10*3/uL (ref 150–400)
RBC: 3.7 MIL/uL — ABNORMAL LOW (ref 3.87–5.11)
RDW: 13.7 % (ref 11.5–15.5)
WBC Count: 5.1 10*3/uL (ref 4.0–10.5)
nRBC: 0 % (ref 0.0–0.2)

## 2021-03-14 LAB — MAGNESIUM: Magnesium: 1.6 mg/dL — ABNORMAL LOW (ref 1.7–2.4)

## 2021-03-14 LAB — PHOSPHORUS: Phosphorus: 5.2 mg/dL — ABNORMAL HIGH (ref 2.5–4.6)

## 2021-03-14 MED ORDER — CEPHALEXIN 500 MG PO CAPS
500.0000 mg | ORAL_CAPSULE | Freq: Four times a day (QID) | ORAL | 0 refills | Status: DC
  Filled 2021-03-14: qty 28, 7d supply, fill #0

## 2021-03-14 MED ORDER — HEPARIN SOD (PORK) LOCK FLUSH 100 UNIT/ML IV SOLN
500.0000 [IU] | Freq: Once | INTRAVENOUS | Status: DC
  Filled 2021-03-14: qty 5

## 2021-03-14 MED ORDER — SODIUM CHLORIDE 0.9% FLUSH
10.0000 mL | Freq: Once | INTRAVENOUS | Status: AC
  Administered 2021-03-14: 10 mL
  Filled 2021-03-14: qty 10

## 2021-03-14 NOTE — Telephone Encounter (Signed)
Called Sarah Newman back and advised her to take a home covid test if she has one.  Called back again and she said the test was negative. Advised her to come in for her labs at 12:15 and then see Joylene John, NP afterwards.  She verbalized understanding and agreement.

## 2021-03-14 NOTE — Telephone Encounter (Signed)
Sent lab results from today to Doreene Nest, PharmD with [facility].  She will address magnesium/phosphorus levels with [facility]'s TPN.

## 2021-03-14 NOTE — Patient Instructions (Signed)
We will contact you with the results of your other lab work when it returns. We will discuss your symptoms with Dr. Berline Lopes and show her the picture of your abdomen to see if she wants to treat you with antibiotics or order a CT scan.

## 2021-03-14 NOTE — Progress Notes (Addendum)
Gynecologic Oncology Symptom Management  Sarah Newman is a 45 year old female s/p abdominal wound exploration and washout; attempt at aspiration of TPN extravasation (no fluid collections identified) with Dr. Berline Lopes on 03/11/2021. She has a history of ovarian cancer most recently who developed an enterocutaneous fistula likely in the setting of recent a Avastin after diagnostic laparoscopy with conversion to laparotomy in the setting of significant intra-abdominal adhesions leading to symptoms of partial small bowel obstruction that was complicated by an unavoidable enterotomy with repair. The patient developed an EC fistula which has been managed with bowel rest and TPN.  She presents to the office today for evaluation of a reported fever of 101.4. She states she took a COVID test prior to coming to the office and it was negative. The patient reports waking up with a severe headache this a.m.  She states usually if she has a fever she has a headache similar to this, which prompted her to check her temperature.  Her temperature was 101.4.  She is unsure if she had chills related to the fever because she has intermittent hot flashes.  She reports having intermittent nausea since surgery that had improved with taking Prilosec but states this morning the nausea was worse and she had episodes of emesis.  She continues to report intermittent abdominal pain but states the pain seems to be worse today.  She has been changing the abdominal wound dressing daily and states she noticed blood clots on the packing a few days after surgery.  She is having yellow to greenish drainage from the left-sided open abdominal wound.  She states she only has to change the dressing once daily and the drainage does not saturate the dressing.  She reports increased abdominal tenderness today especially when touching the abdomen.  She states her bowels and bladder are functioning without difficulty.  She states her bowel movements  are loose with sediment and she has a movement daily.  Denies dysuria, hematuria, or increase in urinary frequency or urgency.  She states she woke up this morning and feels she had a bug bite on her left hand. She thought she saw a bug on her hand during the night. She states her hand was swollen and she was unable to form a fist.  Denies any other symptoms in that arm.  She denies any tenderness or redness at her Port-A-Cath site.  She has had no issues with the port since her recent surgery.  She states she recently finished taking Augmentin prescribed by Dr. Dema Severin about 1.5-2 weeks ago.   Exam: Alert, oriented x 3, in no acute distress. Lungs clear. Heart regular in rate and rhythm. Abdomen soft, tender with light palpation. Abdominal dressing removed. Moderate amount of yellow/green succus drainage noted on the gauze. The left abdominal opening measuring 3 cm in depth. Tender with light palpation of the wound edges. The opening along the lower aspect of the incision with no drainage and shallow. Erythema noted in the skin around the two abdominal wounds. See media for picture. Slight increase in warmth. Wounds lightly packed and dry dressing placed. Lower extremities with mild generalized edema, non-pitting. Right chest port-a-cath accessed and capped. Dressing is intact. No erythema, increased warmth, tenderness, drainage noted around the port or at the insertion site. No obvious bug bite lesion noted on the left hand. Left hand appears similar to the right with minimal swelling. No erythema or edema noted in the left upper arm.  Assessment/Plan: 45 year old female with  history of ovarian cancer and an enterocutaneous fistula s/p abdominal wound exploration and washout; attempt at aspiration of TPN extravasation (no fluid collections identified) with Dr. Berline Lopes on 03/11/2021 now with a fever of 101.4 this am with increased abdominal pain, episodes of emesis. Discussed CBC findings with patient. WBC count is  stable at 5.1. Hgb is stable compared with previous values. Advised she would be contacted with the other labs when resulted. Advised Dr. Berline Lopes would be informed of her symptoms and exam findings to address possible CT imaging. Increased abdominal pain, tenderness with light palpation, fever, emesis concerning for potential subcutaneous infection. Erythema on the abdomen could be related to skin irritation from wound succus, but given fever and other symptoms, plan to begin keflex while waiting on CT imaging per Dr. Berline Lopes. Patient is stable when leaving the clinic and will be contacted with further steps/ recommendations.   Update: Patient discussed with Dr. Berline Lopes who is agreeable with proceeding with stat CT imaging. Given the abdominal erythema surrounding the draining wound and fever, will plan to begin keflex to treat cellulitis while waiting for CT. The patient was updated as well and is agreeable with the plan.

## 2021-03-14 NOTE — Telephone Encounter (Signed)
Received an email from [place] saying that she had a fever of 101.4.  She took dual action Advil (Advil/tylenol) to see if she can get it down.  Called her and she denies having a sore throat, cough or urinary issues.  She is having constant pain from her incisions but says it is no worse since surgery.  She is not taking pain medication.  She said her incisions are not red and they are draining a small amount of green drainage with blood.  She is doing moist to dry dressings once a day.    She said her port site feels better and is not red.  The TPN in her skin is gone.  She does not have any hard areas like she did on Monday.    She mentioned that her pinky and ring finger are swollen maybe from an insect bite.  She noticed this in the middle of the night.   Advised her I would let Dr. Berline Lopes and Lenna Sciara know what is going on and call her back with any recommendations.

## 2021-03-14 NOTE — Telephone Encounter (Signed)
Sarah Newman with CT appointment for tomorrow with arrival at 11:00 at [facility] to drink the water based contrast.  She verbalized understanding and agreement.

## 2021-03-14 NOTE — Telephone Encounter (Signed)
Contacted patient and reviewed lab work from today.  Also informed her that her situation was discussed with Dr. Berline Lopes who agrees with proceeding with CT imaging.  We will go ahead and prescribe Keflex to treat potential abdominal wound cellulitis.  Patient agreeable with the plan.  Advised that the CT would be sent to her insurance for prior authorization and once this was achieved, she would be contacted to arrange for the CT hopefully tomorrow.  No concerns voiced.  She is advised to call for any needs.

## 2021-03-15 ENCOUNTER — Encounter: Payer: Self-pay | Admitting: Hematology and Oncology

## 2021-03-15 ENCOUNTER — Emergency Department (EMERGENCY_DEPARTMENT_HOSPITAL)
Admission: EM | Admit: 2021-03-15 | Discharge: 2021-03-15 | Disposition: A | Discharge status: Home / Self Care | Payer: Medicaid Other | Source: Home / Self Care | Attending: Emergency Medicine | Admitting: Emergency Medicine

## 2021-03-15 ENCOUNTER — Ambulatory Visit: Payer: Medicaid Other | Admitting: Gynecologic Oncology

## 2021-03-15 ENCOUNTER — Encounter (HOSPITAL_COMMUNITY): Payer: Self-pay | Admitting: *Deleted

## 2021-03-15 ENCOUNTER — Emergency Department (HOSPITAL_COMMUNITY): Payer: Medicaid Other

## 2021-03-15 ENCOUNTER — Telehealth: Payer: Self-pay | Admitting: Oncology

## 2021-03-15 ENCOUNTER — Ambulatory Visit (HOSPITAL_COMMUNITY): Payer: Medicaid Other

## 2021-03-15 DIAGNOSIS — R509 Fever, unspecified: Secondary | ICD-10-CM | POA: Insufficient documentation

## 2021-03-15 DIAGNOSIS — Z8616 Personal history of COVID-19: Secondary | ICD-10-CM | POA: Insufficient documentation

## 2021-03-15 DIAGNOSIS — R109 Unspecified abdominal pain: Secondary | ICD-10-CM | POA: Insufficient documentation

## 2021-03-15 DIAGNOSIS — J45909 Unspecified asthma, uncomplicated: Secondary | ICD-10-CM | POA: Insufficient documentation

## 2021-03-15 DIAGNOSIS — Z20822 Contact with and (suspected) exposure to covid-19: Secondary | ICD-10-CM | POA: Insufficient documentation

## 2021-03-15 DIAGNOSIS — R112 Nausea with vomiting, unspecified: Secondary | ICD-10-CM | POA: Insufficient documentation

## 2021-03-15 DIAGNOSIS — G8918 Other acute postprocedural pain: Secondary | ICD-10-CM | POA: Insufficient documentation

## 2021-03-15 DIAGNOSIS — Z8543 Personal history of malignant neoplasm of ovary: Secondary | ICD-10-CM | POA: Insufficient documentation

## 2021-03-15 DIAGNOSIS — R110 Nausea: Secondary | ICD-10-CM

## 2021-03-15 DIAGNOSIS — K632 Fistula of intestine: Secondary | ICD-10-CM | POA: Insufficient documentation

## 2021-03-15 LAB — COMPREHENSIVE METABOLIC PANEL
ALT: 89 U/L — ABNORMAL HIGH (ref 0–44)
AST: 124 U/L — ABNORMAL HIGH (ref 15–41)
Albumin: 3.6 g/dL (ref 3.5–5.0)
Alkaline Phosphatase: 120 U/L (ref 38–126)
Anion gap: 10 (ref 5–15)
BUN: 12 mg/dL (ref 6–20)
CO2: 22 mmol/L (ref 22–32)
Calcium: 9.3 mg/dL (ref 8.9–10.3)
Chloride: 104 mmol/L (ref 98–111)
Creatinine, Ser: 0.62 mg/dL (ref 0.44–1.00)
GFR, Estimated: >60 mL/min (ref >60)
Glucose, Bld: 139 mg/dL — ABNORMAL HIGH (ref 70–99)
Potassium: 3.9 mmol/L (ref 3.5–5.1)
Sodium: 136 mmol/L (ref 135–145)
Total Bilirubin: 0.4 mg/dL (ref 0.3–1.2)
Total Protein: 7.9 g/dL (ref 6.5–8.1)

## 2021-03-15 LAB — RESP PANEL BY RT-PCR (FLU A&B, COVID) ARPGX2
Influenza A by PCR: NEGATIVE
Influenza B by PCR: NEGATIVE
SARS Coronavirus 2 by RT PCR: NEGATIVE

## 2021-03-15 LAB — CBC WITH DIFFERENTIAL/PLATELET
Abs Immature Granulocytes: 0.03 10*3/uL (ref 0.00–0.07)
Basophils Absolute: 0 10*3/uL (ref 0.0–0.1)
Basophils Relative: 0 %
Eosinophils Absolute: 0 10*3/uL (ref 0.0–0.5)
Eosinophils Relative: 0 %
HCT: 35.8 % — ABNORMAL LOW (ref 36.0–46.0)
Hemoglobin: 11.4 g/dL — ABNORMAL LOW (ref 12.0–15.0)
Immature Granulocytes: 0 %
Lymphocytes Relative: 7 %
Lymphs Abs: 0.6 10*3/uL — ABNORMAL LOW (ref 0.7–4.0)
MCH: 26.8 pg (ref 26.0–34.0)
MCHC: 31.8 g/dL (ref 30.0–36.0)
MCV: 84.2 fL (ref 80.0–100.0)
Monocytes Absolute: 0.2 10*3/uL (ref 0.1–1.0)
Monocytes Relative: 3 %
Neutro Abs: 7.8 10*3/uL — ABNORMAL HIGH (ref 1.7–7.7)
Neutrophils Relative %: 90 %
Platelets: 210 10*3/uL (ref 150–400)
RBC: 4.25 MIL/uL (ref 3.87–5.11)
RDW: 13.7 % (ref 11.5–15.5)
WBC: 8.7 10*3/uL (ref 4.0–10.5)
nRBC: 0 % (ref 0.0–0.2)

## 2021-03-15 LAB — PHOSPHORUS: Phosphorus: 3.8 mg/dL (ref 2.5–4.6)

## 2021-03-15 LAB — MAGNESIUM: Magnesium: 1.8 mg/dL (ref 1.7–2.4)

## 2021-03-15 LAB — LACTIC ACID, PLASMA: Lactic Acid, Venous: 1.7 mmol/L (ref 0.5–1.9)

## 2021-03-15 MED ORDER — LACTATED RINGERS IV BOLUS
1000.0000 mL | Freq: Once | INTRAVENOUS | Status: AC
  Administered 2021-03-15: 1000 mL via INTRAVENOUS

## 2021-03-15 MED ORDER — FENTANYL CITRATE (PF) 100 MCG/2ML IJ SOLN
50.0000 ug | Freq: Once | INTRAMUSCULAR | Status: AC
Start: 2021-03-15 — End: 2021-03-15
  Administered 2021-03-15: 50 ug via INTRAVENOUS
  Filled 2021-03-15: qty 2

## 2021-03-15 MED ORDER — PIPERACILLIN-TAZOBACTAM 3.375 G IVPB
3.3750 g | Freq: Three times a day (TID) | INTRAVENOUS | Status: DC
  Administered 2021-03-15: 3.375 g via INTRAVENOUS
  Filled 2021-03-15: qty 50

## 2021-03-15 MED ORDER — AMOXICILLIN-POT CLAVULANATE 875-125 MG PO TABS: 1.0000 | ORAL_TABLET | Freq: Two times a day (BID) | ORAL | 0 refills | Status: DC

## 2021-03-15 MED ORDER — IOHEXOL 9 MG/ML PO SOLN
ORAL | Status: AC
  Filled 2021-03-15: qty 1000

## 2021-03-15 MED ORDER — PROMETHAZINE HCL 12.5 MG RE SUPP: 12.5000 mg | Freq: Three times a day (TID) | RECTAL | 0 refills | Status: AC | PRN

## 2021-03-15 MED ORDER — OXYCODONE HCL 5 MG PO TABS: 5.0000 mg | ORAL_TABLET | ORAL | 0 refills | Status: DC | PRN

## 2021-03-15 MED ORDER — PROMETHAZINE HCL 25 MG RE SUPP
25.0000 mg | Freq: Once | RECTAL | Status: AC
  Administered 2021-03-15: 25 mg via RECTAL
  Filled 2021-03-15: qty 1

## 2021-03-15 MED ORDER — ONDANSETRON HCL 4 MG/2ML IJ SOLN
4.0000 mg | Freq: Once | INTRAMUSCULAR | Status: AC
  Administered 2021-03-15: 4 mg via INTRAVENOUS
  Filled 2021-03-15: qty 2

## 2021-03-15 MED ORDER — IOHEXOL 350 MG/ML SOLN
80.0000 mL | Freq: Once | INTRAVENOUS | Status: AC | PRN
  Administered 2021-03-15: 80 mL via INTRAVENOUS

## 2021-03-15 NOTE — Progress Notes (Signed)
Pharmacy Antibiotic Note  Sarah Newman is a 45 y.o. female admitted on 03/15/2021 with fever, emesis, and increased pain around incision site.  Recent surgery on 8/8 for abdominal wound exploration (for EC fistula) and washout, and aspiration of TPN extravasation (no fluid collection identified).  Reports abdominal discomfort since her surgery.  Pharmacy has been consulted for Zosyn dosing for intra-abdominal infection.  WBC 5.1 >> 8.7, Tm this admit 99.2 (reports fever PTA)  Plan: Zosyn 3.375g IV q8h (4 hour infusion). Monitor culture data, clinical improvement, renal function F/u ability to transition to PO antibiotics    Temp (24hrs), Avg:99.2 F (37.3 C), Min:99.2 F (37.3 C), Max:99.2 F (37.3 C)  Recent Labs  Lab 03/11/21 1050 03/14/21 1301 03/15/21 1137 03/15/21 1138  WBC  --  5.1 8.7  --   CREATININE 0.50 0.71 0.62  --   LATICACIDVEN  --   --   --  1.7    Estimated Creatinine Clearance: 94.3 mL/min (by C-G formula based on SCr of 0.62 mg/dL).    Allergies  Allergen Reactions   Carboplatin Shortness Of Breath   Antimicrobials this admission: Zosyn 8/12 >>   Microbiology results: 8/12 BCx: sent  Thank you for allowing pharmacy to be a part of this patient's care.  Dimple Nanas, PharmD 03/15/2021 2:43 PM

## 2021-03-15 NOTE — Telephone Encounter (Signed)
Called Melah back and advised her she can go to the ER to be evaluated or she can have the CT today.  She said she would like to have the CT and then will go to the ER afterwards.  Discussed that the ER can order a CT but she said she would like to wait to go until after her CT this afternoon.

## 2021-03-15 NOTE — ED Notes (Signed)
CT made aware that pt has had all of her oral contrast.

## 2021-03-15 NOTE — Discharge Instructions (Addendum)
Follow-up with your oncologist and your gynecologist next week.  If you develop a fever, uncontrolled abdominal pain, vomiting or other new concerning symptom, come back to ER for reassessment immediately.

## 2021-03-15 NOTE — Consult Note (Signed)
GYNECOLOGIC ONCOLOGY INPATIENT CONSULTATION  Date of Service: 03/15/21 Requesting Service: ED Requesting Provider: Dr. Madalyn Rob Consulting Provider: Jeral Pinch, MD   HISTORY OF PRESENT ILLNESS: Sarah Newman is a 45 y.o. woman who is seen in consultation at the request of Dr. Roslynn Amble for evaluation of fever, abdominal pain, nausea/emesis.  The patient's cancer history as noted below.  Most recently, in June, given intermittent bowel dysfunction with no obvious evidence of malignancy on imaging, patient underwent diagnostic laparoscopy with exploratory laparotomy and attempt at lysis of adhesions given findings of multiple loops of small bowel adherent to the anterior abdominal wall.  This was complicated by an enterotomy that was oversewed and decision made to abandon the procedure.  Unfortunately, the patient developed an enterocutaneous fistula and was hospitalized in early July.  It was thought to be secondary to recent surgery as well as recent a Avastin.  Patient was made n.p.o. and started on TPN.  Wound care saw her and she was fitted for an Eakin pouch.  She was discharged home and has remained n.p.o.  Due to closure of her small abdominal incision that was allowing drainage of her EC fistula, we return to the operating room this Monday for wound opening and exploration.  The wound was opened, explored, no pockets of either purulence or succus were noted, and it was packed.  She has been packing the incision with half-inch packing tape which she changes daily.  She has had drainage that appears to be succus since.  Yesterday, she contacted my clinic secondary to low-grade fever as well as abdominal pain.  She was seen in clinic and no focal findings notable for mild erythema around her incision, thought likely to be related to irritation from the succus.  The wound was probed with no purulent drainage noted.  Subsequently, the patient has continued to be febrile (T-max 102 at home  per her report, although battery on her thermometer died last night).  She has increased pain around her incision and has had nausea with emesis of bile.  At the time of her surgery on Monday, she also reported that her port access had come out that night with spillage of TPN into the subcutaneous tissue.  I do not appreciate any pockets of fluid on exam.  After cleaning the skin around her port, I attempted to aspirate using an 18-gauge needle in multiple places with no fluid noted.  Her port was reaccessed in the operating room by the ID team.  She denies any fluid collections that she has noticed and describes the discomfort that she had Monday morning has significantly improved since.  Patient has had difficulty taking any pills by mouth secondary to nausea.  She did not pick up the Keflex that was prescribed yesterday in clinic.  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 There is a 16 cm complex cystic mass in the pelvis containing thickened septations located near midline, abutting the superior aspect of the uterus. I suspect this mass is adnexal/ovarian in origin rather than uterine in origin. Specifically, I suspect the mass likely arises from the left ovary/adnexa and is most consistent with a neoplasm. Malignancy is certainly not excluded on this study. Recommend a pelvic ultrasound for further evaluation. 2. The rounded more solid-appearing structure in the right side of the pelvis is probably the right ovary. Recommend attention  on ultrasound. 3. The small amount of fluid in the pelvis is likely reactive to the complex cystic mass likely rising from the left ovary/adnexa. 4. No cause for blood in vomit or black stools identified. No convincing evidence of a perforated or inflamed gastric or duodenal ulcer. 5. Hepatic steatosis.   07/02/2019  Imaging   MRI pelvis 1. 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 There are 2 enhancing nodules within the central upper abdomen raising the possibility of peritoneal carcinomatosis. Additionally, there is a small amount fluid within the pelvis. Malignant ascites not excluded. 2. Fibroid uterus. 3. Hepatic steatosis.   07/02/2019 Imaging   US pelvis 1. There is a large mass in the pelvis also seen on CT imaging. A separate left ovary is not visualized. 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 The mass is suspicious for malignancy. Recommend gynecologic consultation. 2. The right ovary demonstrates an irregular ill-defined hypoechoic hypervascular region centrally. The findings are nonspecific in the right ovary. However, given the apparent left ovarian neoplasm suspicious for malignancy, recommend an MRI to further assess the right ovary. 3. Uterine fibroid measuring 3.6 cm.     07/21/2019 Pathology Results   FINAL MICROSCOPIC DIAGNOSIS: A. 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 - See oncology table. B. OMENTUM, RESECTION: - Omental lymph node with metastatic carcinoma (1/1). - Omental adipose tissue with foci of inflammation and reactive mesothelial changes. C. FALLOPIAN TUBE, LEFT, RESECTION: - Fallopian tube with focal, mild inflammation and fibrosis. - No tumor identified. D. UTERUS, CERVIX, RIGHT TUBE AND OVARY: - Cervix Nabothian cyst and squamous metaplasia. - Endometrium Proliferative. No hyperplasia or carcinoma. - Myometrium Leiomyomata. No malignancy. -Right ovary Poorly differentiated carcinoma, 4.8 cm. See oncology table and comment. -Right Fallopian tube Benign paratubal cyst. No endometriosis or malignancy. ONCOLOGY TABLE: OVARY or FALLOPIAN TUBE or PRIMARY PERITONEUM: Procedure: Bilateral  f-oophorectomy, hysterectomy and omentectomy. Specimen Integrity: Intact Tumor Site: Left ovary and right ovary. See comment. Ovarian Surface Involvement: Not identified. Fallopian Tube Surface Involvement: Not identified. Tumor Size: Left ovary: 18.6 x 16.0 x 7.2 cm. Right ovary: 4.8 x 3.2 x 3.2 cm. Histologic Type: Clear cell carcinoma. See comment. Histologic Grade: High-grade. Other Tissue/ Organ Involvement: Omental lymph node with metastatic carcinoma. Peritoneal/Ascitic Fluid: Pending. Treatment Effect: No known presurgical therapy. Pathologic Stage Classification (pTNM, AJCC 8th Edition): pT3a, pN1a. Representative Tumor Block: A2, A3, A4, A5 A6, A7 and A8. Comment(s): The left ovarian tumor is 18.6 cm in greatest dimension and has features of clear cell carcinoma. The right ovarian tumor is 4.8 cm in greatest dimension and is poorly differentiated with focal features consistent with clear cell carcinoma. The pattern of involvement suggests that the right ovarian tumor is likely metastatic from the larger left ovarian clear cell carcinoma. Sections of the omentum show a lymph node with metastatic carcinoma. Sections of the remainder of the omentum do not show involvement by carcinoma.   07/21/2019 Surgery   Procedure(s) Performed: Exploratory laparotomy with radical tumor debulking including total hysterectomy, bilateral salpingo-oophorectomy, infra-gastric omentectomy, and washings.   Surgeon: Jeral Pinch, MD    Operative Findings: On EUA, mobile uterus and ovarian mass, situated out of the pelvis, spanning 6-8 cm above the umbilicus.  On intra-abdominal entry, inflamed omentum adherent to much of the 16 cm left ovarian mass, adhesions easily lysed bluntly.  No  nodularity of the ovarian mass however on delivery of the mass through the midline incision, there was Intra-Op rupture of one of the smaller cystic components with drainage of clear brown-tinged fluid.  Grossly  normal-appearing left fallopian tube as well as right tube.  Right ovary mildly enlarged measuring approximately 4 cm and firm/nodular, suspicious for tumor involvement.  Uterus 8-10 cm with 4 cm anterior mid body fibroid.  Some very small volume miliary disease along right pelvic sidewall, removed with uterine specimen.  Evidence of small diverticular disease.  Small bowel normal appearing although multiple loops of bowel adherent to each other, especially by mesentery, and an inflammatory manner.  An approximately 2 x 2 centimeter nodule suspicious for tumor implant was found in the omentum just inferior to the greater curvature of the stomach.  Additional small (less than 5 mm) implants noted on the stomach and posterior aspect of the left lobe of the liver.  Otherwise the liver surface and diaphragm were smooth.   08/03/2019 Cancer Staging   Staging form: Ovary, Fallopian Tube, and Primary Peritoneal Carcinoma, AJCC 8th Edition - Pathologic: Stage IIIA2 (pT3a, pN1, cM0) - Signed by Heath Lark, MD on 08/03/2019   08/12/2019 Procedure   Placement of single lumen port a cath via right internal jugular vein. The catheter tip lies at the cavo-atrial junction. A power injectable port a cath was placed and is ready for immediate use.   08/22/2019 -  Chemotherapy   The patient had carboplatin and taxol for chemotherapy treatment.     09/23/2019 Genetic Testing   Negative genetic testing:  No pathogenic variants detected on the [facility] TumorNext-HRD+CancerNext paired germline and somatic genetic test. The report date is 09/23/2019.  The TumorNext-HRD+CancerNext test offered by [facility] includes paired germline and tumor analyses of 11 genes associated with homologous recombination repair (ATM, BARD1, BRIP1, CHEK2, MRE11A, NBN, PALB2, RAD51C, RAD51D, BRCA1, BRCA2) plus germline analyses of 26 additional genes associated with hereditary cancer (APC, AXIN2, BMPR1A, CDH1, CDK4, CDKN2A, DICER1, HOXB13, EPCAM, GREM1,  MLH1, MSH2, MSH3, MSH6, MUTYH, NF1, NTHL1, PMS2, POLD1, POLE, PTEN, RECQL, SMAD4, SMARCA4, STK11, and TP53).   11/14/2019 Tumor Marker   Patient's tumor was tested for the following markers: CA-125 Results of the tumor marker test revealed 10.2.   12/05/2019 Tumor Marker   Patient's tumor was tested for the following markers: CA-125 Results of the tumor marker test revealed 9.9   01/05/2020 Imaging   No specific findings of active malignancy. Interval resection of the pelvic mass. Subtle nodularity along the right side of the vaginal cuff merits surveillance.     01/05/2020 Tumor Marker   Patient's tumor was tested for the following markers: CA-125 Results of the tumor marker test revealed 9.7   02/17/2020 Tumor Marker   Patient's tumor was tested for the following markers: CA-125 Results of the tumor marker test revealed 9.6   04/05/2020 Imaging   1. There is some minimal stranding along the mesentery and omentum without overt omental caking or well-defined nodularity. Faint bandlike density along the anterior peritoneal reflection in the pelvis, slightly more notable than on prior. Overall the appearance is not considered specific for recurrence but given the slight increase in prominence from 01/05/2020, would encourage careful surveillance by tumor markers and imaging. 2. Mild lower lumbar degenerative facet arthropathy.   04/05/2020 Tumor Marker   Patient's tumor was tested for the following markers: CA-125 Results of the tumor marker test revealed 11   05/29/2020 Tumor Marker   Patient's tumor was  tested for the following markers: CA-125. Results of the tumor marker test revealed 17.   07/04/2020 Tumor Marker   Patient's tumor was tested for the following markers: CA-125. Results of the tumor marker test revealed 20.1   07/16/2020 Imaging   Stable mild stranding throughout mesenteric and omental fat, without evidence of discrete masses or ascites. No evidence of new or progressive  disease.   08/29/2020 Tumor Marker   Patient's tumor was tested for the following markers: CA-125 Results of the tumor marker test revealed 32.7.   09/07/2020 Imaging   1. Interval progression of peritoneal disease within the abdomen and pelvis. Tumor predominantly involves the serosal surface of the large and small bowel loops. No signs of bowel obstruction identified at this time. 2. No evidence for metastatic disease to the chest.   09/19/2020 Imaging   1. No acute findings identified within the abdomen or pelvis. No evidence for bowel obstruction. 2. Similar appearance of soft tissue thickening along the peritoneal reflections within the abdomen and pelvis compatible with known peritoneal disease. Recently characterized increased soft tissue along the surface of the jejunal bowel loops and tethering of pelvic small bowel loops is difficult to quantify (and compared with previous exam) due to lack of IV contrast material.     09/20/2020 -  Chemotherapy    Patient is on Treatment Plan: OVARIAN RECURRENT 3RD LINE CARBOPLATIN D1 / GEMCITABINE D1,8 (4/800) Q21D  Carboplatin was discontinued after 10/15/2020 due to allergic reaction      10/01/2020 Tumor Marker   Patient's tumor was tested for the following markers: CA-125. Results of the tumor marker test revealed 37.2.   10/15/2020 Tumor Marker   Patient's tumor was tested for the following markers: CA-125. Results of the tumor marker test revealed 48   10/22/2020 Tumor Marker   Patient's tumor was tested for the following markers: CA-125 Results of the tumor marker test revealed 28.5   11/05/2020 Tumor Marker   Patient's tumor was tested for the following markers: CA-125 Results of the tumor marker test revealed 18.9   12/17/2020 Imaging   1. No significant change in soft tissue thickening and nodularity in the low pelvis, with minimal thickening of the peritoneum throughout the abdomen. 2. Similar appearance of soft tissue thickening  about the mid small bowel in the central, ventral abdomen. 3. Findings are consistent with stable peritoneal metastatic disease. 4. Status post hysterectomy and cholecystectomy.   12/18/2020 Tumor Marker   Patient's tumor was tested for the following markers: CA-125 Results of the tumor marker test revealed 10.8   01/11/2021 Imaging   Findings concerning for small bowel obstruction.     01/11/2021 Tumor Marker   Patient's tumor was tested for the following markers: CA-125. Results of the tumor marker test revealed 10.4.   01/11/2021 Imaging   CT abdomen and pelvis Mildly prominent proximal loops of small bowel although no true transition zone is seen in the distal small bowel appears within normal limits. These changes could represent early partial small bowel obstruction. The overall appearance however is similar to that seen on prior CT from May of 2022.   Mild peritoneal thickening similar to that seen on the prior exam.   Fatty liver.   No other focal abnormality is noted.    PAST MEDICAL HISTORY: Past Medical History:  Diagnosis Date   Anemia due to antineoplastic chemotherapy    Arthritis    Asthma due to seasonal allergies    01-21-2021 per pt seasonal and exercised  induced, does not have inhaler, last used one few years   Chronic abdominal pain 01/2021   Chronic nausea    01-21-2021  per pt intermittant nausea and when has abd pain most of time will have vomiting   Family history of breast cancer    Family history of colon cancer    Family history of ovarian cancer    Family history of prostate cancer    History of 2019 novel coronavirus disease (COVID-19) 09/08/2020   positive results in epic, per pt mild symptoms that resolved   History of Helicobacter pylori infection 07/2016   E[facility] w/ bx's by [facility],  chronic h.pylori w/ gastritis,  treated  (no ulcer)   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 lap  w/ TAH/ BSO, Stage IIIA2 ovarian carcinoma, started chemo 08-22-2019;  progression w/ abdominal carcinomatosis   On total parenteral nutrition (TPN) 02/02/2021   infused thru [place] in place    Steroid-induced diabetes ([place])    followed by dr Alvy Bimler    PAST SURGICAL HISTORY: Past Surgical History:  Procedure Laterality Date   CHOLECYSTECTOMY N/A 02/16/2015   Procedure: LAPAROSCOPIC CHOLECYSTECTOMY WITH INTRAOPERATIVE CHOLANGIOGRAM;  Surgeon: Johnathan Hausen, MD;  Location: [facility];  Service: General;  Laterality: N/A;   INCISION AND DRAINAGE ABSCESS N/A 02/02/2021   Procedure: INCISION AND DRAINAGE ABSCESS;  Surgeon: Lahoma Crocker, MD;  Location: [facility];  Service: Gynecology;  Laterality: N/A;   IR IMAGING GUIDED PORT INSERTION  08/12/2019   LAPAROSCOPY N/A 01/23/2021   Procedure: LAPAROSCOPY DIAGNOSTIC;  Surgeon: Lafonda Mosses, MD;  Location: [facility];  Service: Gynecology;  Laterality: N/A;   LAPAROTOMY N/A 01/23/2021   Procedure: LAPAROTOMY, ENTEROTOMY REPAIR OF THE BOWEL;  Surgeon: Lafonda Mosses, MD;  Location: [facility];  Service: Gynecology;  Laterality: N/A;   SALPINGOOPHORECTOMY N/A 07/21/2019   Procedure: EXPLORATORY LAPAROTOMY, TOTAL ABDOMINAL HYSTERECTOMY, BILATERAL SALPINGO OOPHORECTOMY, OMENTECTOMY;  Surgeon: Lafonda Mosses, MD;  Location: [facility];  Service: Gynecology;  Laterality: N/A;   UPPER GASTROINTESTINAL ENDOSCOPY  08/01/2016   by dr Corinda Gubler in [place] N/A 03/11/2021   Procedure: OPENING AND EXPLORATION OF ABDOMINAL EXCISION;  Surgeon: Lafonda Mosses, MD;  Location: [facility];  Service: Gynecology;  Laterality: N/A;    OB/GYN HISTORY: OB History  Gravida Para Term Preterm AB Living  _0 SAB IAB Ectopic Multiple Live Births          2    # Outcome Date GA Lbr Len/2nd Weight Sex Delivery Anes PTL Lv  3 Para           2 Para           1 Para             MEDICATIONS:  Current Facility-Administered Medications:    piperacillin-tazobactam (ZOSYN) IVPB 3.375 g, 3.375 g, Intravenous, Q8H, Dimple Nanas, RPH, Last Rate: 12.5 mL/hr at 03/15/21 1514, 3.375 g at 03/15/21 1514   promethazine (PHENERGAN) suppository 25 mg, 25 mg, Rectal, Once, Lucrezia Starch, MD  Current Outpatient Medications:    acetaminophen (TYLENOL) 325 MG tablet, Take 650 mg by mouth every 6 (six) hours as needed for mild pain, fever or headache., Disp: , Rfl:    HYDROmorphone HCl (DILAUDID) 1 MG/ML LIQD, Take 1 mL (1 mg total) by mouth every 4 (four)  hours as needed for severe pain or moderate pain., Disp: 30 mL, Rfl: 0   insulin aspart (NOVOLOG) 100 UNIT/ML injection, Inject 0-15 Units into the skin 4 (four) times daily. Correction coverage: Moderate (average weight, post-op) CBG < 70: Implement Hypoglycemia Standing Orders and refer to Hypoglycemia Standing Orders sidebar report CBG 70 - 120: 0 units CBG 121 - 150: 2 units CBG 151 - 200: 3 units CBG 201 - 250: 5 units CBG 251 - 300: 8 units CBG 301 - 350: 11 units CBG 351 - 400: 15 units, Disp: 60 mL, Rfl: 11   LORazepam (ATIVAN) 0.5 MG tablet, Take 1 tablet by mouth every 8 hours as needed for anxiety/insomnia. Do not take and drive., Disp: 15 tablet, Rfl: 0   omeprazole (PRILOSEC) 40 MG capsule, Take 1 capsule (40 mg total) by mouth daily., Disp: 30 capsule, Rfl: 3   ondansetron (ZOFRAN ODT) 4 MG disintegrating tablet, Take 1 tablet (4 mg total) by mouth every 4 (four) hours as needed for up to 20 doses for nausea or vomiting., Disp: 20 tablet, Rfl: 0   Accu-Chek Softclix Lancets lancets, Use as instructed, Disp: 100 each, Rfl: 12   acetaminophen (TYLENOL) 650 MG suppository, Place 1 suppository (650 mg total) rectally every 6 (six) hours as needed for moderate pain. (Patient not taking: Reported on 03/15/2021), Disp: 15 suppository, Rfl: 2   amoxicillin-clavulanate (AUGMENTIN) 875-125 MG tablet, Take 1 tablet by mouth 2  (two) times daily for 7 days. 7 day supply, Disp: 14 tablet, Rfl: 0   cephALEXin (KEFLEX) 500 MG capsule, Take 1 capsule by mouth 4 times daily., Disp: 28 capsule, Rfl: 0   glucose blood test strip, Use as instructed, Disp: 100 each, Rfl: 12   lidocaine-prilocaine (EMLA) cream, APPLY TO THE AFFECTED AREA AS DIRECTED TO ACCESS PORT (Patient not taking: Reported on 03/15/2021), Disp: 30 g, Rfl: 11   promethazine (PHENERGAN) 12.5 MG suppository, Place 1 suppository (12.5 mg total) rectally every 8 (eight) hours as needed for up to 30 doses for nausea or vomiting., Disp: 30 each, Rfl: 0  ALLERGIES: Allergies  Allergen Reactions   Carboplatin Shortness Of Breath    FAMILY HISTORY: Family History  Problem Relation Age of Onset   Colon cancer Father 78   Prostate cancer Father 32   Colon cancer Paternal Aunt        dx. early 31s   Ovarian cancer Paternal Grandmother 81   Breast cancer Cousin    Endometriosis Mother    Cancer Paternal Uncle        unknown type, dx. early 27s   Cancer Paternal Uncle 3       unknown type   Esophageal cancer Neg Hx     SOCIAL HISTORY: Social History   Socioeconomic History   Marital status: Married    Spouse name: Herbie [place]   Number of children: 2   Years of education: Not on file   Highest education level: Not on file  Occupational History   Not on file  Tobacco Use   Smoking status: Never   Smokeless tobacco: Never  Vaping Use   Vaping Use: Never used  Substance and Sexual Activity   Alcohol use: Yes    Comment: Social drinker   Drug use: Never   Sexual activity: Yes    Birth control/protection: Surgical  Other Topics Concern   Not on file  Social History Narrative   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  Newly married as  of October 2020.   2 boys, age 72 and 27   Social Determinants of Health   Emergency planning/management officer Strain: Not on file  Food Insecurity: Not on file  Transportation Needs: Not on file  Physical Activity: Not on  file  Stress: Not on file  Social Connections: Not on file  Intimate Partner Violence: Not on file   PHYSICAL EXAM: BP (!) 121/96   Pulse (!) 104   Temp 99.2 F (37.3 C) (Oral)   Resp 16   LMP 07/05/2019   SpO2 95%  General: Alert, oriented, no acute distress. HEENT: Normocephalic, atraumatic.  Sclera anicteric Chest: Clear to auscultation bilaterally.  No wheezes or rhonchi.  Port site clean and sterilely dressed.  No erythema or fluctuance. Cardiovascular: Regular rate (heart rate in the 90s on my exam) and rhythm, no murmurs, rubs, or gallops. Abdomen: Obese.  Normoactive bowel sounds.  Soft, nondistended, mild to moderate tenderness to palpation at the incision site, especially at the superior aspect of the incision itself.  No masses or hepatosplenomegaly appreciated.  The packing in the incision was removed and has drainage that appears to be succus on it.  The incision was cleaned with a syringe of sterile saline.  A dressing was placed just over it and the incision left unpacked.  The incision itself appears healthy although there is an area along the skin on the superior aspect where it is difficult to tell if this is irritation related to succus or infection. Extremities: Grossly normal range of motion.  Warm, well perfused.  No edema bilaterally. Skin: No rashes or lesions.  LABORATORY AND RADIOLOGIC DATA: CBC    Component Value Date/Time   WBC 8.7 03/15/2021 1137   RBC 4.25 03/15/2021 1137   HGB 11.4 (L) 03/15/2021 1137   HGB 10.0 (L) 03/14/2021 1301   HCT 35.8 (L) 03/15/2021 1137   PLT 210 03/15/2021 1137   PLT 223 03/14/2021 1301   MCV 84.2 03/15/2021 1137   MCH 26.8 03/15/2021 1137   MCHC 31.8 03/15/2021 1137   RDW 13.7 03/15/2021 1137   LYMPHSABS 0.6 (L) 03/15/2021 1137   MONOABS 0.2 03/15/2021 1137   EOSABS 0.0 03/15/2021 1137   BASOSABS 0.0 03/15/2021 1137   CMP Latest Ref Rng & Units 03/15/2021 03/14/2021 03/11/2021  Glucose 70 - 99 mg/dL 139(H) 137(H) 156(H)   BUN 6 - 20 mg/dL _0 Creatinine 0.44 - 1.00 mg/dL 0.62 0.71 0.50  Sodium 135 - 145 mmol/L 136 137 137  Potassium 3.5 - 5.1 mmol/L 3.9 3.9 3.9  Chloride 98 - 111 mmol/L 104 103 100  CO2 22 - 32 mmol/L 22 24 -  Calcium 8.9 - 10.3 mg/dL 9.3 8.8(L) -  Total Protein 6.5 - 8.1 g/dL 7.9 7.0 -  Total Bilirubin 0.3 - 1.2 mg/dL 0.4 <0.2(L) -  Alkaline Phos 38 - 126 U/L 120 78 -  AST 15 - 41 U/L 124(H) 19 -  ALT 0 - 44 U/L 89(H) 18 -   CT A/P today with contrast: IMPRESSION: 1. Increased fat stranding and increased size of air contrast collection in the anterior abdominal wall at site of known enterocutaneous fistula, with tract extending to the skin surface. This currently measures 2.7 x 1.4 cm, previously 1.3 cm. 2. No new subcutaneous collection or abscess. 3. Thin sliver of fluid adjacent to the anterior right lobe of the liver measures 0.9 x 2.3 x 1.6 cm, no definite peripheral enhancement or internal air. This likely represents postoperative fluid,  of indeterminate sterility. 4. Equivocal distal gastric wall thickening. 5. Trace right pleural effusion. 6. Perigastric lymph node or nodularity measuring 10 mm in the gastrohepatic ligament, not significantly changed from recent exam.  ASSESSMENT AND PLAN: Sarah Newman is a 45 y.o. woman with history of ovarian cancer now within [facility] fistula presenting with what I suspect is a soft tissue infection of her incision.  I reviewed with the patient her lab work from today which is notable for likely some dehydration in the setting of her symptoms as well as elevation of her liver enzymes, AST and ALT.  I reviewed CT images myself with one of the radiologist.  There is increased size of the air-contrast collection in the anterior abdominal wall that is likely due to recent surgical exploration with opening of this incision as well as packing that is in place at the time of her scan.  There is no significant fluid collection within her  anterior abdominal wall.  Additionally, there is no evidence of bowel obstruction or other intra-abdominal or pelvic findings that would explain her recent symptoms.  Patient was reassured to hear that contrast is seen throughout her small and large bowel.  Patient has blood cultures that were drawn, with at least one from her port.  Given long-term port access with TPN administration, she is at risk of bacteremia.  Her labs and vital signs would not support this diagnosis, but I will follow-up on results from her blood cultures.  We have discussed further interventions that may help her from dislodging the port needle moving forward.  She is receiving Zosyn currently.  I have asked the emergency department physician to send in either Augmentin or another antibiotic to treat a soft tissue infection.  I have also asked that we try Phenergan suppository to see if we can control her nausea.  Patient is feeling better now than when she came in.  If nausea is able to be controlled to allow her to take her p.o. medications at home, I think the patient can be discharged.  She notes that her thermometer's battery died last night.  I have asked her to get a new thermometer today when her family picks up her antibiotics to make sure that the temperature she is taking at home is accurate.  If the patient is febrile or does not feel significantly better, then I would recommend at least admission overnight for observation and treatment of symptoms.  Otherwise, I will plan to follow-up with her by phone tomorrow if she goes home.  The above assessment and plan was communicated to the patient's primary team.   Jeral Pinch MD Gynecologic Oncology

## 2021-03-15 NOTE — ED Triage Notes (Signed)
Pt complains of fever, vomiting, pain in abdominal surgical wound. Hx of ovarian cancer, finished chemo 5/17, had fistula after abdominal surgery. Had last surgery 8/8

## 2021-03-15 NOTE — Telephone Encounter (Signed)
Called Sarah Newman and advised her not to eat or drink anything beside the contrast 4 hours before her CT today at 1:00.  She verbalized agreement.  She said her temperature got up to 102 degrees last night.  She is throwing up and is having a hard time taking tylenol on an empty stomach.  She said her hand is a little less swollen this morning.  Joylene John, NP notified of symptoms this morning and Dr. Berline Lopes will be advised.

## 2021-03-15 NOTE — ED Provider Notes (Signed)
[facility] DEPT Provider Note   CSN: [id number] Arrival date & time: 03/15/21  1121     History Chief Complaint  Patient presents with   Fever   Emesis    Sarah Newman is a 45 y.o. female.  Presented to ER with concern for fever, abdominal pain, nausea and vomiting.  Patient reports ever since having surgery she has been having some abdominal discomfort.  Over the past couple days has had increased pain around her incision site.  Also endorses having fever up to 102 at home.  Had discussed with her surgeon and was recommended to get a CT scan performed. Had been ordered course of Keflex yesterday for possible infection around incision site.  8/80 patient had abdominal wound exploration and washout.  Patient had enterocutaneous fistula.  Operative findings noted for well-healing skin incision, no drainage.  Patient reports having some increased drainage from her abdominal wound site. HPI     Past Medical History:  Diagnosis Date   Anemia due to antineoplastic chemotherapy    Arthritis    Asthma due to seasonal allergies    01-21-2021 per pt seasonal and exercised induced, does not have inhaler, last used one few years   Chronic abdominal pain 01/2021   Chronic nausea    01-21-2021  per pt intermittant nausea and when has abd pain most of time will have vomiting   Family history of breast cancer    Family history of colon cancer    Family history of ovarian cancer    Family history of prostate cancer    History of 2019 novel coronavirus disease (COVID-19) 09/08/2020   positive results in epic, per pt mild symptoms that resolved   History of Helicobacter pylori infection 07/2016   EGD w/ bx's by [facility],  chronic h.pylori w/ gastritis,  treated  (no ulcer)   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   Postoperative abscess 02/02/2021   Abscess    Intermittent small bowel obstruction due to adhesions ([place]) 01/23/2021   Intra-abdominal adhesions    Nausea with vomiting 01/11/2021   SBO (small bowel obstruction) ([place]) 01/11/2021   Poor dentition 12/10/2020   Pancytopenia, acquired ([place]) 12/10/2020   Allergic reaction 10/15/2020   Physical debility 10/01/2020   Steroid-induced diabetes ([place]) 09/20/2020   Constipation 09/20/2020   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   Cancer associated pain 09/14/2020   Lab test positive for detection of COVID-19 virus 09/10/2020   Hypokalemia 08/30/2020   Obesity, Class II, BMI 35-39.9 07/17/2020   Anemia, chronic disease 05/29/2020   UTI (urinary tract infection) 01/09/2020   Dysuria 01/06/2020   Anemia due to antineoplastic chemotherapy 12/05/2019   Gastritis 12/05/2019   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/2019   Weight gain 10/25/2019   Vaginal dryness 10/24/2019   Drug-induced hyperglycemia 10/03/2019   Genetic testing 09/23/2019   Bone pain 09/13/2019   Restless leg 09/13/2019   Elevated liver enzymes 09/13/2019   Family history of ovarian cancer    Family history of prostate  cancer    Family history of colon cancer    Family history of breast cancer    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[place])    Pelvic mass in female 07/11/2019   Obesity 07/11/2019   Cervical high risk HPV (human papillomavirus) test positive 02/17/2017   Cholecystitis 02/16/2015    Past Surgical History:  Procedure Laterality Date   CHOLECYSTECTOMY N/A 02/16/2015   Procedure: LAPAROSCOPIC CHOLECYSTECTOMY WITH INTRAOPERATIVE CHOLANGIOGRAM;   Surgeon: Johnathan Hausen, MD;  Location: [facility];  Service: General;  Laterality: N/A;   INCISION AND DRAINAGE ABSCESS N/A 02/02/2021   Procedure: INCISION AND DRAINAGE ABSCESS;  Surgeon: Lahoma Crocker, MD;  Location: [facility];  Service: Gynecology;  Laterality: N/A;   IR IMAGING GUIDED PORT INSERTION  08/12/2019   LAPAROSCOPY N/A 01/23/2021   Procedure: LAPAROSCOPY DIAGNOSTIC;  Surgeon: Lafonda Mosses, MD;  Location: [facility];  Service: Gynecology;  Laterality: N/A;   LAPAROTOMY N/A 01/23/2021   Procedure: LAPAROTOMY, ENTEROTOMY REPAIR OF THE BOWEL;  Surgeon: Lafonda Mosses, MD;  Location: [facility];  Service: Gynecology;  Laterality: N/A;   SALPINGOOPHORECTOMY N/A 07/21/2019   Procedure: EXPLORATORY LAPAROTOMY, TOTAL ABDOMINAL HYSTERECTOMY, BILATERAL SALPINGO OOPHORECTOMY, OMENTECTOMY;  Surgeon: Lafonda Mosses, MD;  Location: [facility];  Service: Gynecology;  Laterality: N/A;   UPPER GASTROINTESTINAL ENDOSCOPY  08/01/2016   by dr Corinda Gubler in [place] N/A 03/11/2021   Procedure: OPENING AND EXPLORATION OF ABDOMINAL EXCISION;  Surgeon: Lafonda Mosses, MD;  Location: [facility];  Service: Gynecology;  Laterality: N/A;     OB History     Gravida  3   Para  3   Term      Preterm      AB      Living  2      SAB      IAB      Ectopic      Multiple      Live Births  2           Family History  Problem Relation Age of Onset   Colon cancer Father 2   Prostate cancer Father 77   Colon cancer Paternal Aunt        dx. early 37s   Ovarian cancer Paternal Grandmother 56   Breast cancer Cousin    Endometriosis Mother    Cancer Paternal Uncle        unknown type, dx. early 49s   Cancer Paternal Uncle 72       unknown type   Esophageal cancer Neg Hx     Social History   Tobacco Use   Smoking status: Never   Smokeless tobacco: Never  Vaping Use   Vaping Use: Never used  Substance Use  Topics   Alcohol use: Yes    Comment: Social drinker   Drug use: Never    Home Medications Prior to Admission medications   Medication Sig Start Date End Date Taking? Authorizing Provider  acetaminophen (TYLENOL) 325 MG tablet Take 650 mg by mouth every 6 (six) hours as needed for mild pain, fever or headache.   Yes [provider]  HYDROmorphone HCl (DILAUDID) 1 MG/ML LIQD Take 1 mL (1 mg total) by mouth every 4 (four) hours as needed for severe pain or moderate pain. 02/09/21  Yes Lafonda Mosses, MD  insulin aspart (NOVOLOG) 100 UNIT/ML injection Inject 0-15 Units into the skin 4 (four) times daily. Correction coverage: Moderate (average  weight, post-op) CBG < 70: Implement Hypoglycemia Standing Orders and refer to Hypoglycemia Standing Orders sidebar report CBG 70 - 120: 0 units CBG 121 - 150: 2 units CBG 151 - 200: 3 units CBG 201 - 250: 5 units CBG 251 - 300: 8 units CBG 301 - 350: 11 units CBG 351 - 400: 15 units 02/10/21  Yes Lafonda Mosses, MD  LORazepam (ATIVAN) 0.5 MG tablet Take 1 tablet by mouth every 8 hours as needed for anxiety/insomnia. Do not take and drive. 03/12/21  Yes Cross, Melissa D, NP  omeprazole (PRILOSEC) 40 MG capsule Take 1 capsule (40 mg total) by mouth daily. 02/09/21  Yes Lafonda Mosses, MD  ondansetron (ZOFRAN ODT) 4 MG disintegrating tablet Take 1 tablet (4 mg total) by mouth every 4 (four) hours as needed for up to 20 doses for nausea or vomiting. 02/22/21  Yes Lafonda Mosses, MD  Accu-Chek Softclix Lancets lancets Use as instructed 02/10/21   Lafonda Mosses, MD  acetaminophen (TYLENOL) 650 MG suppository Place 1 suppository (650 mg total) rectally every 6 (six) hours as needed for moderate pain. Patient not taking: Reported on 03/15/2021 02/09/21   Lafonda Mosses, MD  amoxicillin-clavulanate (AUGMENTIN) 875-125 MG tablet Take 1 tablet by mouth 2 (two) times daily for 7 days. 7 day supply 03/15/21 03/22/21  Lucrezia Starch, MD   cephALEXin (KEFLEX) 500 MG capsule Take 1 capsule by mouth 4 times daily. 03/14/21   Joylene John D, NP  glucose blood test strip Use as instructed 02/10/21   Lafonda Mosses, MD  lidocaine-prilocaine (EMLA) cream APPLY TO THE AFFECTED AREA AS DIRECTED TO ACCESS PORT Patient not taking: Reported on 03/15/2021 10/22/20 10/22/21  Heath Lark, MD  promethazine (PHENERGAN) 12.5 MG suppository Place 1 suppository (12.5 mg total) rectally every 8 (eight) hours as needed for up to 30 doses for nausea or vomiting. 03/15/21   Lucrezia Starch, MD    Allergies    Carboplatin  Review of Systems   Review of Systems  Constitutional:  Negative for chills and fever.  HENT:  Negative for ear pain and sore throat.   Eyes:  Negative for pain and visual disturbance.  Respiratory:  Negative for cough and shortness of breath.   Cardiovascular:  Negative for chest pain and palpitations.  Gastrointestinal:  Positive for abdominal pain. Negative for vomiting.  Genitourinary:  Negative for dysuria and hematuria.  Musculoskeletal:  Negative for arthralgias and back pain.  Skin:  Negative for color change and rash.  Neurological:  Negative for seizures and syncope.  All other systems reviewed and are negative.  Physical Exam Updated Vital Signs BP (!) 121/96   Pulse (!) 104   Temp 99.2 F (37.3 C) (Oral)   Resp 16   LMP 07/05/2019   SpO2 95%   Physical Exam Vitals and nursing note reviewed.  Constitutional:      General: She is not in acute distress.    Appearance: She is well-developed.  HENT:     Head: Normocephalic and atraumatic.  Eyes:     Conjunctiva/sclera: Conjunctivae normal.  Cardiovascular:     Rate and Rhythm: Normal rate and regular rhythm.     Heart sounds: No murmur heard. Pulmonary:     Effort: Pulmonary effort is normal. No respiratory distress.     Breath sounds: Normal breath sounds.  Abdominal:     Palpations: Abdomen is soft.     Tenderness: There is no abdominal  tenderness.  Comments: Some generalized tenderness but no rebound or guarding, incision site is intact, no active drainage noted, no surrounding erythema  Musculoskeletal:        General: No swelling or tenderness.     Cervical back: Neck supple.  Skin:    General: Skin is warm and dry.  Neurological:     General: No focal deficit present.     Mental Status: She is alert.    ED Results / Procedures / Treatments   Labs (all labs ordered are listed, but only abnormal results are displayed) Labs Reviewed  CBC WITH DIFFERENTIAL/PLATELET - Abnormal; Notable for the following components:      Result Value   Hemoglobin 11.4 (*)    HCT 35.8 (*)    Neutro Abs 7.8 (*)    Lymphs Abs 0.6 (*)    All other components within normal limits  COMPREHENSIVE METABOLIC PANEL - Abnormal; Notable for the following components:   Glucose, Bld 139 (*)    AST 124 (*)    ALT 89 (*)    All other components within normal limits  RESP PANEL BY RT-PCR (FLU A&B, COVID) ARPGX2  CULTURE, BLOOD (ROUTINE X 2)  CULTURE, BLOOD (ROUTINE X 2)  CULTURE, BLOOD (SINGLE)  LACTIC ACID, PLASMA  MAGNESIUM  PHOSPHORUS  URINALYSIS, ROUTINE W REFLEX MICROSCOPIC     EKG None  Radiology CT ABDOMEN PELVIS W CONTRAST  Result Date: 03/15/2021 CLINICAL DATA:  Abdominal pain and fever. Persistent postprocedural fistula. New onset of fever and vomiting. Increased abdominal pain. History of enterocutaneous fistula. Wound debridement 03/11/2021 EXAM: CT ABDOMEN AND PELVIS WITH CONTRAST TECHNIQUE: Multidetector CT imaging of the abdomen and pelvis was performed using the standard protocol following bolus administration of intravenous contrast. CONTRAST:  39m OMNIPAQUE IOHEXOL 350 MG/ML SOLN COMPARISON:  Most recent CT 02/28/2021 FINDINGS: Lower chest: Trace right pleural effusion, increased from prior. No acute airspace disease. Central line tip in the SVC, partially included. Hepatobiliary: No focal hepatic lesion. There is no  intrahepatic fluid collection. Thin sliver of fluid adjacent to the anterior right lobe of the liver, series 2, image 22 measures 0.9 x 2.3 x 1.6 cm, no definite peripheral enhancement. No internal air. Clips in the gallbladder fossa postcholecystectomy. Stable biliary prominence postcholecystectomy. Pancreas: No ductal dilatation or inflammation. Spleen: Normal in size without focal abnormality. Adrenals/Urinary Tract: Normal adrenal glands. No hydronephrosis or perinephric edema. Homogeneous renal enhancement with symmetric excretion on delayed phase imaging. Urinary bladder is physiologically distended without wall thickening. Stomach/Bowel: Mild perigastric stranding is similar to prior exam, series 2, image 28. Equivocal distal gastric wall thickening. No evidence of small-bowel obstruction. Small bowel loops and transverse colon again appose the anterior abdominal wall in the region of suspected fistula. There is slight increased contrast collection in the anterior abdominal wall, series 2, image 51, with contrast collection currently measuring approximately 2.7 x 1.4 cm, previously 1.3 cm. The enteric contrast on the current exam has progressed beyond the small bowel and is seen throughout the colon. There is no colonic wall thickening or colonic inflammation. Vascular/Lymphatic: Normal caliber abdominal aorta. Patent portal and splenic veins. No portal venous or mesenteric gas. Perigastric lymph node or nodularity measuring 10 mm, not significantly changed from prior exam, series 2, image 25. No other enlarged lymph nodes. Reproductive: Hysterectomy without adnexal mass. Minimal soft tissue thickening adjacent to the vaginal cuff is stable. Other: Increased fat stranding and air contrast collection in the anterior abdominal wall at site of known fistula, with tract  extending to the skin surface, currently 2.7 x 1.4 cm, previously 1.3 cm. There is no discrete drainable subcutaneous collection. No drainable  abdominopelvic collection. There may be trace free fluid in the pelvis. Mild edema the small bowel mesentery is similar. There is no free air. Musculoskeletal: There are no acute or suspicious osseous abnormalities. IMPRESSION: 1. Increased fat stranding and increased size of air contrast collection in the anterior abdominal wall at site of known enterocutaneous fistula, with tract extending to the skin surface. This currently measures 2.7 x 1.4 cm, previously 1.3 cm. 2. No new subcutaneous collection or abscess. 3. Thin sliver of fluid adjacent to the anterior right lobe of the liver measures 0.9 x 2.3 x 1.6 cm, no definite peripheral enhancement or internal air. This likely represents postoperative fluid, of indeterminate sterility. 4. Equivocal distal gastric wall thickening. 5. Trace right pleural effusion. 6. Perigastric lymph node or nodularity measuring 10 mm in the gastrohepatic ligament, not significantly changed from recent exam. Electronically Signed   By: Keith Rake M.D.   On: 03/15/2021 16:32    Procedures Procedures   Medications Ordered in ED Medications  piperacillin-tazobactam (ZOSYN) IVPB 3.375 g (3.375 g Intravenous New Bag/Given 03/15/21 1514)  promethazine (PHENERGAN) suppository 25 mg (has no administration in time range)  ondansetron (ZOFRAN) injection 4 mg (4 mg Intravenous Given 03/15/21 1242)  fentaNYL (SUBLIMAZE) injection 50 mcg (50 mcg Intravenous Given 03/15/21 1240)  lactated ringers bolus 1,000 mL (1,000 mLs Intravenous New Bag/Given 03/15/21 1243)  iohexol (OMNIPAQUE) 9 MG/ML oral solution (  Contrast Given 03/15/21 1245)  lactated ringers bolus 1,000 mL (1,000 mLs Intravenous New Bag/Given 03/15/21 1514)  fentaNYL (SUBLIMAZE) injection 50 mcg (50 mcg Intravenous Given 03/15/21 1508)  iohexol (OMNIPAQUE) 350 MG/ML injection 80 mL (80 mLs Intravenous Contrast Given 03/15/21 1600)    ED Course  I have reviewed the triage vital signs and the nursing notes.  Pertinent  labs & imaging results that were available during my care of the patient were reviewed by me and considered in my medical decision making (see chart for details).  Clinical Course as of 03/15/21 1714  Fri Mar 15, 2021  1650 Gyn onc pt, enterocutaneous fistula, recent exploration by gyn onc, here with pain and n/v; pending gyn onc recs [MK]    Clinical Course User Index [MK] Kommor, [place], MD   MDM Rules/Calculators/A&P                           45 year old lady presents to ER with concern for abdominal pain, vomiting and reported fever at home.  No fever, provided pain, antiemetics and fluids and gave empiric antibiotics due to concern for possible infection/sepsis.  No leukocytosis, no elevated lactate.  Her CT scan was grossly reassuring.  Dr. Berline Lopes her gyn oncologist evaluated patient.  Feels patient can be managed in the outpatient setting.  Recommend oral antibiotic for slight irritation around the abdominal wound site.  While awaiting p.o. challenge, signed out to Dr. Tinnie Gens.  Anticipate discharge likely with close outpatient follow-up.   Final Clinical Impression(s) / ED Diagnoses Final diagnoses:  Post-operative pain  Enterocutaneous fistula  Nausea without vomiting    Rx / DC Orders ED Discharge Orders          Ordered    amoxicillin-clavulanate (AUGMENTIN) 875-125 MG tablet  2 times daily        03/15/21 1709    promethazine (PHENERGAN) 12.5 MG suppository  Every 8  hours PRN        03/15/21 1709             Lucrezia Starch, MD 03/15/21 1714

## 2021-03-16 ENCOUNTER — Inpatient Hospital Stay (HOSPITAL_COMMUNITY)
Admission: EM | Admit: 2021-03-16 | Discharge: 2021-03-21 | DRG: 314 | Disposition: A | Discharge status: Home / Self Care | Payer: Medicaid Other | Attending: Gynecologic Oncology | Admitting: Gynecologic Oncology

## 2021-03-16 ENCOUNTER — Other Ambulatory Visit: Payer: Self-pay

## 2021-03-16 ENCOUNTER — Emergency Department (HOSPITAL_COMMUNITY): Payer: Medicaid Other

## 2021-03-16 ENCOUNTER — Encounter (HOSPITAL_COMMUNITY): Payer: Self-pay | Admitting: Emergency Medicine

## 2021-03-16 DIAGNOSIS — C562 Malignant neoplasm of left ovary: Secondary | ICD-10-CM | POA: Diagnosis not present

## 2021-03-16 DIAGNOSIS — Z789 Other specified health status: Secondary | ICD-10-CM

## 2021-03-16 DIAGNOSIS — Z794 Long term (current) use of insulin: Secondary | ICD-10-CM | POA: Diagnosis not present

## 2021-03-16 DIAGNOSIS — G8918 Other acute postprocedural pain: Secondary | ICD-10-CM | POA: Diagnosis present

## 2021-03-16 DIAGNOSIS — K7200 Acute and subacute hepatic failure without coma: Secondary | ICD-10-CM

## 2021-03-16 DIAGNOSIS — Z8543 Personal history of malignant neoplasm of ovary: Secondary | ICD-10-CM | POA: Diagnosis not present

## 2021-03-16 DIAGNOSIS — Z8616 Personal history of COVID-19: Secondary | ICD-10-CM

## 2021-03-16 DIAGNOSIS — Z9049 Acquired absence of other specified parts of digestive tract: Secondary | ICD-10-CM | POA: Diagnosis not present

## 2021-03-16 DIAGNOSIS — Z9221 Personal history of antineoplastic chemotherapy: Secondary | ICD-10-CM

## 2021-03-16 DIAGNOSIS — J45909 Unspecified asthma, uncomplicated: Secondary | ICD-10-CM | POA: Diagnosis present

## 2021-03-16 DIAGNOSIS — Z20822 Contact with and (suspected) exposure to covid-19: Secondary | ICD-10-CM | POA: Diagnosis present

## 2021-03-16 DIAGNOSIS — T80212A Local infection due to central venous catheter, initial encounter: Secondary | ICD-10-CM

## 2021-03-16 DIAGNOSIS — R748 Abnormal levels of other serum enzymes: Secondary | ICD-10-CM

## 2021-03-16 DIAGNOSIS — B957 Other staphylococcus as the cause of diseases classified elsewhere: Secondary | ICD-10-CM | POA: Diagnosis present

## 2021-03-16 DIAGNOSIS — C563 Malignant neoplasm of bilateral ovaries: Secondary | ICD-10-CM | POA: Diagnosis present

## 2021-03-16 DIAGNOSIS — B377 Candidal sepsis: Secondary | ICD-10-CM

## 2021-03-16 DIAGNOSIS — Z90710 Acquired absence of both cervix and uterus: Secondary | ICD-10-CM

## 2021-03-16 DIAGNOSIS — G4709 Other insomnia: Secondary | ICD-10-CM

## 2021-03-16 DIAGNOSIS — C762 Malignant neoplasm of abdomen: Secondary | ICD-10-CM

## 2021-03-16 DIAGNOSIS — T80211A Bloodstream infection due to central venous catheter, initial encounter: Secondary | ICD-10-CM | POA: Diagnosis not present

## 2021-03-16 DIAGNOSIS — A419 Sepsis, unspecified organism: Secondary | ICD-10-CM

## 2021-03-16 DIAGNOSIS — C786 Secondary malignant neoplasm of retroperitoneum and peritoneum: Secondary | ICD-10-CM | POA: Diagnosis present

## 2021-03-16 DIAGNOSIS — R7881 Bacteremia: Secondary | ICD-10-CM | POA: Diagnosis not present

## 2021-03-16 DIAGNOSIS — Z8041 Family history of malignant neoplasm of ovary: Secondary | ICD-10-CM | POA: Diagnosis not present

## 2021-03-16 DIAGNOSIS — G4700 Insomnia, unspecified: Secondary | ICD-10-CM | POA: Diagnosis present

## 2021-03-16 DIAGNOSIS — L0291 Cutaneous abscess, unspecified: Secondary | ICD-10-CM

## 2021-03-16 DIAGNOSIS — Z888 Allergy status to other drugs, medicaments and biological substances status: Secondary | ICD-10-CM

## 2021-03-16 DIAGNOSIS — R6520 Severe sepsis without septic shock: Secondary | ICD-10-CM | POA: Diagnosis not present

## 2021-03-16 DIAGNOSIS — Z79899 Other long term (current) drug therapy: Secondary | ICD-10-CM

## 2021-03-16 DIAGNOSIS — K632 Fistula of intestine: Secondary | ICD-10-CM | POA: Diagnosis present

## 2021-03-16 DIAGNOSIS — T80212D Local infection due to central venous catheter, subsequent encounter: Secondary | ICD-10-CM | POA: Diagnosis not present

## 2021-03-16 DIAGNOSIS — Y848 Other medical procedures as the cause of abnormal reaction of the patient, or of later complication, without mention of misadventure at the time of the procedure: Secondary | ICD-10-CM | POA: Diagnosis present

## 2021-03-16 LAB — BLOOD CULTURE ID PANEL (REFLEXED) - BCID2

## 2021-03-16 LAB — COMPREHENSIVE METABOLIC PANEL
ALT: 181 U/L — ABNORMAL HIGH (ref 0–44)
AST: 232 U/L — ABNORMAL HIGH (ref 15–41)
Albumin: 3.2 g/dL — ABNORMAL LOW (ref 3.5–5.0)
Alkaline Phosphatase: 140 U/L — ABNORMAL HIGH (ref 38–126)
Anion gap: 10 (ref 5–15)
BUN: 15 mg/dL (ref 6–20)
CO2: 23 mmol/L (ref 22–32)
Calcium: 8.7 mg/dL — ABNORMAL LOW (ref 8.9–10.3)
Chloride: 102 mmol/L (ref 98–111)
Creatinine, Ser: 0.4 mg/dL — ABNORMAL LOW (ref 0.44–1.00)
GFR, Estimated: >60 mL/min (ref >60)
Glucose, Bld: 241 mg/dL — ABNORMAL HIGH (ref 70–99)
Potassium: 4.3 mmol/L (ref 3.5–5.1)
Sodium: 135 mmol/L (ref 135–145)
Total Bilirubin: 0.4 mg/dL (ref 0.3–1.2)
Total Protein: 7.5 g/dL (ref 6.5–8.1)

## 2021-03-16 LAB — CBC WITH DIFFERENTIAL/PLATELET
Abs Immature Granulocytes: 0.06 10*3/uL (ref 0.00–0.07)
Basophils Absolute: 0 10*3/uL (ref 0.0–0.1)
Basophils Relative: 0 %
Eosinophils Absolute: 0 10*3/uL (ref 0.0–0.5)
Eosinophils Relative: 0 %
HCT: 30.5 % — ABNORMAL LOW (ref 36.0–46.0)
Hemoglobin: 9.8 g/dL — ABNORMAL LOW (ref 12.0–15.0)
Immature Granulocytes: 1 %
Lymphocytes Relative: 6 %
Lymphs Abs: 0.4 10*3/uL — ABNORMAL LOW (ref 0.7–4.0)
MCH: 27.1 pg (ref 26.0–34.0)
MCHC: 32.1 g/dL (ref 30.0–36.0)
MCV: 84.3 fL (ref 80.0–100.0)
Monocytes Absolute: 0.2 10*3/uL (ref 0.1–1.0)
Monocytes Relative: 4 %
Neutro Abs: 5.9 10*3/uL (ref 1.7–7.7)
Neutrophils Relative %: 89 %
Platelets: 162 10*3/uL (ref 150–400)
RBC: 3.62 MIL/uL — ABNORMAL LOW (ref 3.87–5.11)
RDW: 13.7 % (ref 11.5–15.5)
WBC: 6.6 10*3/uL (ref 4.0–10.5)
nRBC: 0.3 % — ABNORMAL HIGH (ref 0.0–0.2)

## 2021-03-16 LAB — APTT: aPTT: 25 seconds (ref 24–36)

## 2021-03-16 LAB — RESP PANEL BY RT-PCR (FLU A&B, COVID) ARPGX2
Influenza A by PCR: NEGATIVE
Influenza B by PCR: NEGATIVE
SARS Coronavirus 2 by RT PCR: NEGATIVE

## 2021-03-16 LAB — LACTIC ACID, PLASMA
Lactic Acid, Venous: 1.3 mmol/L (ref 0.5–1.9)
Lactic Acid, Venous: 1.1 mmol/L (ref 0.5–1.9)

## 2021-03-16 LAB — GLUCOSE, CAPILLARY: Glucose-Capillary: 109 mg/dL — ABNORMAL HIGH (ref 70–99)

## 2021-03-16 LAB — PROTIME-INR
INR: 1.1 (ref 0.8–1.2)
Prothrombin Time: 14.4 seconds (ref 11.4–15.2)

## 2021-03-16 MED ORDER — SODIUM CHLORIDE 0.9 % IV SOLN
2.0000 g | Freq: Once | INTRAVENOUS | Status: AC
  Administered 2021-03-16: 2 g via INTRAVENOUS
  Filled 2021-03-16 (×2): qty 2

## 2021-03-16 MED ORDER — ONDANSETRON 4 MG PO TBDP
4.0000 mg | ORAL_TABLET | ORAL | Status: DC | PRN
  Administered 2021-03-17 (×2): 4 mg via ORAL
  Filled 2021-03-16 (×3): qty 1

## 2021-03-16 MED ORDER — VANCOMYCIN HCL 1750 MG/350ML IV SOLN
1750.0000 mg | INTRAVENOUS | Status: AC
  Administered 2021-03-16: 1750 mg via INTRAVENOUS
  Filled 2021-03-16: qty 350

## 2021-03-16 MED ORDER — LORAZEPAM 0.5 MG PO TABS
0.5000 mg | ORAL_TABLET | Freq: Three times a day (TID) | ORAL | Status: DC | PRN
  Administered 2021-03-16: 0.5 mg via ORAL
  Filled 2021-03-16: qty 1

## 2021-03-16 MED ORDER — PROMETHAZINE HCL 12.5 MG RE SUPP
12.5000 mg | Freq: Three times a day (TID) | RECTAL | Status: DC | PRN
  Filled 2021-03-16: qty 1

## 2021-03-16 MED ORDER — ALTEPLASE 2 MG IJ SOLR
2.0000 mg | Freq: Once | INTRAMUSCULAR | Status: DC
  Filled 2021-03-16: qty 2

## 2021-03-16 MED ORDER — LACTATED RINGERS IV BOLUS (SEPSIS)
1000.0000 mL | Freq: Once | INTRAVENOUS | Status: AC
  Administered 2021-03-16: 1000 mL via INTRAVENOUS

## 2021-03-16 MED ORDER — SODIUM CHLORIDE 0.9 % IV SOLN
2.0000 g | Freq: Three times a day (TID) | INTRAVENOUS | Status: DC
  Administered 2021-03-17 – 2021-03-18 (×5): 2 g via INTRAVENOUS
  Filled 2021-03-16 (×5): qty 2

## 2021-03-16 MED ORDER — ACETAMINOPHEN 650 MG RE SUPP
650.0000 mg | Freq: Four times a day (QID) | RECTAL | Status: DC | PRN
  Administered 2021-03-16: 650 mg via RECTAL
  Filled 2021-03-16 (×2): qty 1

## 2021-03-16 MED ORDER — ENOXAPARIN SODIUM 40 MG/0.4ML IJ SOSY
40.0000 mg | PREFILLED_SYRINGE | INTRAMUSCULAR | Status: DC
  Administered 2021-03-16 – 2021-03-17 (×2): 40 mg via SUBCUTANEOUS
  Filled 2021-03-16 (×2): qty 0.4

## 2021-03-16 MED ORDER — HYDROMORPHONE HCL 1 MG/ML IJ SOLN
1.0000 mg | Freq: Once | INTRAMUSCULAR | Status: AC
  Administered 2021-03-16: 1 mg via INTRAVENOUS
  Filled 2021-03-16: qty 1

## 2021-03-16 MED ORDER — METRONIDAZOLE 500 MG/100ML IV SOLN
500.0000 mg | Freq: Once | INTRAVENOUS | Status: AC
  Administered 2021-03-16: 500 mg via INTRAVENOUS
  Filled 2021-03-16: qty 100

## 2021-03-16 MED ORDER — INSULIN ASPART 100 UNIT/ML IJ SOLN: 0.0000 [IU] | Freq: Four times a day (QID) | INTRAMUSCULAR | Status: DC

## 2021-03-16 MED ORDER — SODIUM CHLORIDE 0.9 % IV SOLN
200.0000 mg | Freq: Once | INTRAVENOUS | Status: AC
  Administered 2021-03-16: 200 mg via INTRAVENOUS
  Filled 2021-03-16: qty 200

## 2021-03-16 MED ORDER — ZINC OXIDE 12.8 % EX OINT
TOPICAL_OINTMENT | CUTANEOUS | Status: DC | PRN
  Filled 2021-03-16: qty 56.7

## 2021-03-16 MED ORDER — PANTOPRAZOLE SODIUM 40 MG PO TBEC
40.0000 mg | DELAYED_RELEASE_TABLET | Freq: Every day | ORAL | Status: DC
  Administered 2021-03-17: 40 mg via ORAL
  Filled 2021-03-16: qty 1

## 2021-03-16 MED ORDER — METRONIDAZOLE 500 MG/100ML IV SOLN
500.0000 mg | Freq: Two times a day (BID) | INTRAVENOUS | Status: DC
  Administered 2021-03-17 – 2021-03-18 (×3): 500 mg via INTRAVENOUS
  Filled 2021-03-16 (×3): qty 100

## 2021-03-16 MED ORDER — SODIUM CHLORIDE 0.9 % IV SOLN
100.0000 mg | INTRAVENOUS | Status: DC
  Administered 2021-03-17: 100 mg via INTRAVENOUS
  Filled 2021-03-16 (×2): qty 100

## 2021-03-16 MED ORDER — VANCOMYCIN HCL IN DEXTROSE 1-5 GM/200ML-% IV SOLN
1000.0000 mg | Freq: Two times a day (BID) | INTRAVENOUS | Status: DC
  Administered 2021-03-16 – 2021-03-19 (×6): 1000 mg via INTRAVENOUS
  Filled 2021-03-16 (×6): qty 200

## 2021-03-16 MED ORDER — ONDANSETRON HCL 4 MG/2ML IJ SOLN
4.0000 mg | Freq: Once | INTRAMUSCULAR | Status: AC
  Administered 2021-03-16: 4 mg via INTRAVENOUS
  Filled 2021-03-16: qty 2

## 2021-03-16 MED ORDER — VANCOMYCIN HCL 1750 MG/350ML IV SOLN: 1750.0000 mg | INTRAVENOUS | Status: DC

## 2021-03-16 MED ORDER — KCL IN DEXTROSE-NACL 20-5-0.45 MEQ/L-%-% IV SOLN
INTRAVENOUS | Status: DC
  Filled 2021-03-16: qty 1000
  Filled 2021-03-16: qty 2000

## 2021-03-16 MED ORDER — LACTATED RINGERS IV SOLN: INTRAVENOUS | Status: DC

## 2021-03-16 MED ORDER — ACETAMINOPHEN 325 MG PO TABS
650.0000 mg | ORAL_TABLET | Freq: Once | ORAL | Status: AC
  Administered 2021-03-16: 650 mg via ORAL
  Filled 2021-03-16: qty 2

## 2021-03-16 MED ORDER — HYDROMORPHONE HCL 1 MG/ML IJ SOLN
0.5000 mg | INTRAMUSCULAR | Status: DC | PRN
  Administered 2021-03-16 – 2021-03-18 (×7): 0.5 mg via INTRAVENOUS
  Filled 2021-03-16 (×7): qty 0.5

## 2021-03-16 MED ORDER — OXYCODONE-ACETAMINOPHEN 5-325 MG PO TABS: 1.0000 | ORAL_TABLET | ORAL | Status: DC | PRN

## 2021-03-16 NOTE — Consult Note (Signed)
Sarah Newman    Date of Admission:  03/16/2021     Reason for Consult:Auto consult for candidemia     Referring Physician: Auto consult for candidemia  Current antibiotics: Vancomycin 8/13-present Cefepime 8/13-present MTZ 8/13-present Eraxis 8/13-present   ASSESSMENT:    45 y.o. 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  Suspect staph epi may be a contaminant but will wait for repeat negative cultures prior to stopping vanc in the setting of open EC fistula Suspicious for intra-abdominal etiology of fungemia, but ideally would have Port removed given candidemia with hx of TPN.  However, her access is also an issue.  Hold TPN for now but can nutrition be given peripherally when appropriate?  This would hopefully enable Port removal for line holiday in the setting of candidemia Avoid placing central lines until documented clearance of blood cultures LFTs likely elevated due to sepsis and TPN, however, CT yesterday did note thin sliver of fluid.  Currently not c/w abscess.  Will follow LFTs.  If they continue to rise while holding TPN would repeat imaging of liver to ensure no abscess formation Follow up final ID and susceptibilities from 8/12 Should have non-urgent eye exam in setting of fungemia Will follow   Principal Problem:   Candidemia ([place]) Active Problems:   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HCC)   Elevated liver enzymes   Enterocutaneous fistula   Sepsis ([place])   On total parenteral nutrition (TPN)   Staphylococcus epidermidis bacteremia   MEDICATIONS:    Scheduled Meds: . alteplase  2 mg Intracatheter Once  . enoxaparin (LOVENOX) injection  40 mg Subcutaneous Q24H  .  pantoprazole  40 mg Oral Daily   Continuous Infusions: . anidulafungin 200 mg (03/16/21 1250)   Followed by  . [START ON 03/17/2021] anidulafungin    . ceFEPime (MAXIPIME) IV 2 g (03/16/21 1546)  . [START ON 03/17/2021] ceFEPime (MAXIPIME) IV    . dextrose 5 % and 0.45 % NaCl with KCl 20 mEq/L 150 mL/hr at 03/16/21 1545  . metronidazole    . [START ON 03/17/2021] metronidazole     PRN Meds:.acetaminophen, HYDROmorphone (DILAUDID) injection, LORazepam, ondansetron, oxyCODONE-acetaminophen, promethazine  HPI:    Sarah Newman is a 44 y.o. female with a past medical history of ovarian cancer (diagnosed 2020) who presented to the emergency department yesterday with a complaint of fever, abdominal pain, nausea, and vomiting.  Her oncology history is outlined in the note from Sarah Newman on 03/15/2021.  Sarah Newman was having intermittent bowel dysfunction with no obvious evidence of malignancy on imaging in June and underwent diagnostic laparoscopy with exploratory laparotomy and attempt at lysis of adhesions.  This was complicated by enterotomy.  She was then admitted in early July with EC fistula vs subcutaneous wound infection.  She went to the OR 7/2 for I&D of phlegmon within the subcutaneous tissue but no definitive evidence of EC fistula at that time.  The wound was packed and within 24 hrs had output that was c/w succus, thus confirming an EC fistula.  OR cultures at that time grew Citrobacter freundii, Klebsiella pneumonia, and Bacteroides.  Treated with 7 days of abx post I&D.  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  She saw Dr Sarah Newman (General surgeon whom helped with I&D during July surgery) on 03/05/21.  They noted at that time concerns for abdominal wall abscess subcutaneously where the skin had been closing despite her ongoing fistula.  Dr Sarah Newman performed an in office I&D with about 20cc pus drained from the procedure.  She was given  Augmentin x 7 days.  She was then seen by gynecology oncology on 8/5.  There was continued concern due to the closure of her small abdominal incision that was allowing drainage of her EC fistula that she may accumulate a subcutaneous infection.  As a result, she returned to the operating room on 8/8 for wound opening and exploration.  The wound was opened, explored, and no pockets of either purulence or succus were noted.  She was discharged home.  On 8/11 she contacted her outpatient clinic secondary to low-grade fever and abdominal pain.  She was evaluated in clinic with no focal findings noted except for mild erythema around her incision.  This was thought to be likely due to irritation from the succus.  The wound was probed without purulent drainage noted.  Subsequently, the patient continued to be febrile with increased pain around her incision and had nausea with emesis of bile.  Of note, at the time of her surgery on Monday she also reported that her port access had come out the night prior with spillage of TPN into the subcutaneous tissue.  At the time of her surgery there was no appreciation of fluid pockets on exam.  Her port was reaccessed in the operating room by the IR team.  She continued to receive her TPN.  Upon presentation to the ER yesterday she was afebrile.  Blood cultures were obtained.  CBC did not indicate any evidence of leukocytosis, lactate was 1.7, and CMP was notable for normal creatinine, AST 124, ALT 89, alk phos and T bili normal.  She underwent CT imaging of the abdomen and pelvis which showed increased size of the air-contrast collection in the anterior abdominal wall that was likely due to her recent surgical exploration with opening of the incision and packing that was in place at the time of her scan.  There was no findings for significant fluid collections within the anterior abdominal wall and there was no evidence of bowel obstruction or other intra-abdominal or pelvic  findings that might explain her symptoms in the ER.  She was discharged home on Augmentin after being evaluated by her gynecologist/oncologist.  Unfortunately, blood cultures obtained in the ED are positive for GPC's and yeast in 1 out of 2 peripheral blood cultures.  BC ID has identified Candida parapsilosis and methicillin-resistant staph epidermidis.  She was called back to the emergency department this morning after results of these blood culture results and started on vancomycin, cefepime, Flagyl, and anidulafungin.  Repeat blood cultures have been ordered.  Her lab work today is relatively unchanged from yesterday with the exception that her liver enzymes are slightly worse.  CT yesterday did note a thin sliver of fluid adjacent to the anterior right lobe of the liver, with no peripheral enhancement.  It was thought that this likely could represent postoperative fluid of indeterminate stability.  We are automatically consulted for candidemia.   Past Medical History:  Diagnosis Date  . Anemia due to antineoplastic chemotherapy   . Arthritis   . Asthma due to seasonal allergies    01-21-2021 per pt seasonal and exercised induced, does not have inhaler, last used one  few years  . Chronic abdominal pain 01/2021  . Chronic nausea    01-21-2021  per pt intermittant nausea and when has abd pain most of time will have vomiting  . Family history of breast cancer   . Family history of colon cancer   . Family history of ovarian cancer   . Family history of prostate cancer   . History of 2019 novel coronavirus Newman (COVID-19) 09/08/2020   positive results in epic, per pt mild symptoms that resolved  . History of Helicobacter pylori infection 07/2016   EGD w/ bx's by [facility],  chronic h.pylori w/ gastritis,  treated  (no ulcer)  . Malignant neoplasm of left ovary [facility]) 07/2019   oncologist--- dr gorsuch/ dr Berline Newman--- 07-21-2019 s/p exp. lap w/ TAH/ BSO, Stage IIIA2 ovarian carcinoma, started chemo  08-22-2019;  progression w/ abdominal carcinomatosis  . On total parenteral nutrition (TPN) 02/02/2021   infused thru PAC  . Port-A-Cath in place   . Steroid-induced diabetes ([place])    followed by dr Alvy Bimler    Social History   Tobacco Use  . Smoking status: Never  . Smokeless tobacco: Never  Vaping Use  . Vaping Use: Never used  Substance Use Topics  . Alcohol use: Yes    Comment: Social drinker  . Drug use: Never    Family History  Problem Relation Age of Onset  . Colon cancer Father 30  . Prostate cancer Father 93  . Colon cancer Paternal Aunt        dx. early 56s  . Ovarian cancer Paternal Grandmother 18  . Breast cancer Cousin   . Endometriosis Mother   . Cancer Paternal Uncle        unknown type, dx. early 77s  . Cancer Paternal Uncle 11       unknown type  . Esophageal cancer Neg Hx     Allergies  Allergen Reactions  . Carboplatin Shortness Of Breath    Review of Systems  Constitutional:  Positive for fever.  Respiratory: Negative.    Cardiovascular: Negative.   Gastrointestinal:  Positive for abdominal pain, nausea and vomiting.  Genitourinary: Negative.   All other systems reviewed and are negative.  OBJECTIVE:   Blood pressure (!) 115/59, pulse 95, temperature 99.6 F (37.6 C), temperature source Oral, resp. rate 18, height '5\' 2"'  (1.575 m), weight 87.5 kg, last menstrual period 07/05/2019, SpO2 98 %. Body mass index is 35.3 kg/m.  Physical Exam Constitutional:      General: She is not in acute distress.    Appearance: Normal appearance.  HENT:     Head: Normocephalic and atraumatic.  Eyes:     Extraocular Movements: Extraocular movements intact.     Conjunctiva/sclera: Conjunctivae normal.  Pulmonary:     Effort: Pulmonary effort is normal. No respiratory distress.  Abdominal:     Comments: Dressing overlying EC fistula site with drainage noted on dressing.   Musculoskeletal:        General: Normal range of motion.     Cervical back:  Normal range of motion and neck supple.     Right lower leg: No edema.     Left lower leg: No edema.  Skin:    General: Skin is warm and dry.     Findings: No rash.  Neurological:     General: No focal deficit present.     Mental Status: She is alert and oriented to person, place, and time.  Psychiatric:  Mood and Affect: Mood normal.        Behavior: Behavior normal.     Lab Results: Lab Results  Component Value Date   WBC 6.6 03/16/2021   HGB 9.8 (L) 03/16/2021   HCT 30.5 (L) 03/16/2021   MCV 84.3 03/16/2021   PLT 162 03/16/2021    Lab Results  Component Value Date   NA 135 03/16/2021   K 4.3 03/16/2021   CO2 23 03/16/2021   GLUCOSE 241 (H) 03/16/2021   BUN 15 03/16/2021   CREATININE 0.40 (L) 03/16/2021   CALCIUM 8.7 (L) 03/16/2021   GFRNONAA >60 03/16/2021   GFRAA >60 04/05/2020    Lab Results  Component Value Date   ALT 181 (H) 03/16/2021   AST 232 (H) 03/16/2021   ALKPHOS 140 (H) 03/16/2021   BILITOT 0.4 03/16/2021    No results found for: CRP  No results found for: ESRSEDRATE  I have reviewed the micro and lab results in Epic.  Imaging: CT ABDOMEN PELVIS W CONTRAST  Result Date: 03/15/2021 CLINICAL DATA:  Abdominal pain and fever. Persistent postprocedural fistula. New onset of fever and vomiting. Increased abdominal pain. History of enterocutaneous fistula. Wound debridement 03/11/2021 EXAM: CT ABDOMEN AND PELVIS WITH CONTRAST TECHNIQUE: Multidetector CT imaging of the abdomen and pelvis was performed using the standard protocol following bolus administration of intravenous contrast. CONTRAST:  69m OMNIPAQUE IOHEXOL 350 MG/ML SOLN COMPARISON:  Most recent CT 02/28/2021 FINDINGS: Lower chest: Trace right pleural effusion, increased from prior. No acute airspace Newman. Central line tip in the SVC, partially included. Hepatobiliary: No focal hepatic lesion. There is no intrahepatic fluid collection. Thin sliver of fluid adjacent to the anterior right  lobe of the liver, series 2, image 22 measures 0.9 x 2.3 x 1.6 cm, no definite peripheral enhancement. No internal air. Clips in the gallbladder fossa postcholecystectomy. Stable biliary prominence postcholecystectomy. Pancreas: No ductal dilatation or inflammation. Spleen: Normal in size without focal abnormality. Adrenals/Urinary Tract: Normal adrenal glands. No hydronephrosis or perinephric edema. Homogeneous renal enhancement with symmetric excretion on delayed phase imaging. Urinary bladder is physiologically distended without wall thickening. Stomach/Bowel: Mild perigastric stranding is similar to prior exam, series 2, image 28. Equivocal distal gastric wall thickening. No evidence of small-bowel obstruction. Small bowel loops and transverse colon again appose the anterior abdominal wall in the region of suspected fistula. There is slight increased contrast collection in the anterior abdominal wall, series 2, image 51, with contrast collection currently measuring approximately 2.7 x 1.4 cm, previously 1.3 cm. The enteric contrast on the current exam has progressed beyond the small bowel and is seen throughout the colon. There is no colonic wall thickening or colonic inflammation. Vascular/Lymphatic: Normal caliber abdominal aorta. Patent portal and splenic veins. No portal venous or mesenteric gas. Perigastric lymph node or nodularity measuring 10 mm, not significantly changed from prior exam, series 2, image 25. No other enlarged lymph nodes. Reproductive: Hysterectomy without adnexal mass. Minimal soft tissue thickening adjacent to the vaginal cuff is stable. Other: Increased fat stranding and air contrast collection in the anterior abdominal wall at site of known fistula, with tract extending to the skin surface, currently 2.7 x 1.4 cm, previously 1.3 cm. There is no discrete drainable subcutaneous collection. No drainable abdominopelvic collection. There may be trace free fluid in the pelvis. Mild edema  the small bowel mesentery is similar. There is no free air. Musculoskeletal: There are no acute or suspicious osseous abnormalities. IMPRESSION: 1. Increased fat stranding and increased size of  air contrast collection in the anterior abdominal wall at site of known enterocutaneous fistula, with tract extending to the skin surface. This currently measures 2.7 x 1.4 cm, previously 1.3 cm. 2. No new subcutaneous collection or abscess. 3. Thin sliver of fluid adjacent to the anterior right lobe of the liver measures 0.9 x 2.3 x 1.6 cm, no definite peripheral enhancement or internal air. This likely represents postoperative fluid, of indeterminate sterility. 4. Equivocal distal gastric wall thickening. 5. Trace right pleural effusion. 6. Perigastric lymph node or nodularity measuring 10 mm in the gastrohepatic ligament, not significantly changed from recent exam. Electronically Signed   By: Keith Rake M.D.   On: 03/15/2021 16:32   DG Chest Port 1 View  Result Date: 03/16/2021 CLINICAL DATA:  45 year old female with possible sepsis. Recent abdominal surgery. EXAM: PORTABLE CHEST 1 VIEW COMPARISON:  Portable chest 11/25/2019. FINDINGS: Portable AP semi upright view at 1054 hours. Stable low lung volumes. Stable right chest power port. Normal cardiac size and mediastinal contours. Visualized tracheal air column is within normal limits. Allowing for portable technique the lungs are clear. Paucity of bowel gas in the upper abdomen. No acute osseous abnormality identified. IMPRESSION: No acute cardiopulmonary abnormality. Electronically Signed   By: Genevie Ann M.D.   On: 03/16/2021 11:22     Imaging  independently reviewed in Epic.  Raynelle Highland for [facility] [phone] pager 03/16/2021, 3:57 PM

## 2021-03-16 NOTE — H&P (Signed)
Sarah Newman is an 45 y.o. female.   Chief Complaint: fever HPI: The patient has a h/o EOC.  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   Several days ago she was taken back for an abdominal wound exploration and washout; attempt at aspiration of TPN extravasation (no fluid collections identified). She had presented to the ED yesterday and blood cultures were obtained.  She returns today with persistent fever, chills, abdominal pain, N/V.   She denies any tenderness or redness at her Port-A-Cath site.  The blood cultures grew out S. Epidermidis and a candidal species.     Past Medical History:  Diagnosis Date   Anemia due to antineoplastic chemotherapy    Arthritis    Asthma due to seasonal allergies    01-21-2021 per pt seasonal and exercised induced, does not have inhaler, last used one few years   Chronic abdominal pain 01/2021   Chronic nausea    01-21-2021  per pt intermittant nausea and when has abd pain most of time will have vomiting   Family history of breast cancer    Family history of colon cancer    Family history of ovarian cancer    Family history of prostate cancer    History of 2019 novel coronavirus disease (COVID-19) 09/08/2020   positive results in epic, per pt mild symptoms that resolved   History of Helicobacter pylori infection 07/2016   E[facility] w/ bx's [facility],  chronic h.pylori w/ gastritis,  treated  (no ulcer)   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;  progression w/ abdominal carcinomatosis   On total parenteral nutrition (TPN) 02/02/2021   infused thru [place] in place    Steroid-induced diabetes ([place])    followed by dr Alvy Bimler    Past Surgical History:  Procedure Laterality Date    CHOLECYSTECTOMY N/A 02/16/2015   Procedure: LAPAROSCOPIC CHOLECYSTECTOMY WITH INTRAOPERATIVE CHOLANGIOGRAM;  Surgeon: Johnathan Hausen, MD;  Location: [facility];  Service: General;  Laterality: N/A;   INCISION AND DRAINAGE ABSCESS N/A 02/02/2021   Procedure: INCISION AND DRAINAGE ABSCESS;  Surgeon: Lahoma Crocker, MD;  Location: [facility];  Service: Gynecology;  Laterality: N/A;   IR IMAGING GUIDED PORT INSERTION  08/12/2019   LAPAROSCOPY N/A 01/23/2021   Procedure: LAPAROSCOPY DIAGNOSTIC;  Surgeon: Lafonda Mosses, MD;  Location: [facility];  Service: Gynecology;  Laterality: N/A;   LAPAROTOMY N/A 01/23/2021   Procedure: LAPAROTOMY, ENTEROTOMY REPAIR OF THE BOWEL;  Surgeon: Lafonda Mosses, MD;  Location[facility];  Service: Gynecology;  Laterality: N/A;   SALPINGOOPHORECTOMY N/A 07/21/2019   Procedure: EXPLORATORY LAPAROTOMY, TOTAL ABDOMINAL HYSTERECTOMY, BILATERAL SALPINGO OOPHORECTOMY, OMENTECTOMY;  Surgeon: Lafonda Mosses, MD;  Location: [facility];  Service: Gynecology;  Laterality: N/A;   UPPER GASTROINTESTINAL ENDOSCOPY  08/01/2016   by dr Corinda Gubler in [place] N/A 03/11/2021   Procedure: OPENING AND EXPLORATION OF ABDOMINAL EXCISION;  Surgeon: Lafonda Mosses, MD;  Location: [facility];  Service: Gynecology;  Laterality: N/A;    Family History  Problem Relation Age of Onset   Colon cancer Father 65   Prostate cancer Father 82   Colon cancer Paternal Aunt        dx. early 45s   Ovarian cancer  Paternal Grandmother 65   Breast cancer Cousin    Endometriosis Mother    Cancer Paternal Uncle        unknown type, dx. early 25s   Cancer Paternal Uncle 28       unknown type   Esophageal cancer Neg Hx    Social History:  reports that she has never smoked. She has never used smokeless tobacco. She reports current alcohol use. She reports that she does not use drugs.  Allergies:  Allergies  Allergen Reactions    Carboplatin Shortness Of Breath    (Not in a hospital admission)   Results for orders placed or performed during the hospital encounter of 03/16/21 (from the past 48 hour(s))  Lactic acid, plasma     Status: None   Collection Time: 03/16/21 10:24 AM  Result Value Ref Range   Lactic Acid, Venous 1.3 0.5 - 1.9 mmol/L    Comment: Performed at [facility], [place] [address], [place], [place] [address]  Comprehensive metabolic panel     Status: Abnormal   Collection Time: 03/16/21 10:24 AM  Result Value Ref Range   Sodium 135 135 - 145 mmol/L   Potassium 4.3 3.5 - 5.1 mmol/L   Chloride 102 98 - 111 mmol/L   CO2 23 22 - 32 mmol/L   Glucose, Bld 241 (H) 70 - 99 mg/dL    Comment: Glucose reference range applies only to samples taken after fasting for at least 8 hours.   BUN 15 6 - 20 mg/dL   Creatinine, Ser 0.40 (L) 0.44 - 1.00 mg/dL   Calcium 8.7 (L) 8.9 - 10.3 mg/dL   Total Protein 7.5 6.5 - 8.1 g/dL   Albumin 3.2 (L) 3.5 - 5.0 g/dL   AST 232 (H) 15 - 41 U/L   ALT 181 (H) 0 - 44 U/L   Alkaline Phosphatase 140 (H) 38 - 126 U/L   Total Bilirubin 0.4 0.3 - 1.2 mg/dL   GFR, Estimated >60 >60 mL/min    Comment: (NOTE) Calculated using the CKD-EPI Creatinine Equation (2021)    Anion gap 10 5 - 15    Comment: Performed at [facility], [place] [address] [place], [place] [address]  CBC WITH DIFFERENTIAL     Status: Abnormal   Collection Time: 03/16/21 10:24 AM  Result Value Ref Range   WBC 6.6 4.0 - 10.5 K/uL   RBC 3.62 (L) 3.87 - 5.11 MIL/uL   Hemoglobin 9.8 (L) 12.0 - 15.0 g/dL   HCT 30.5 (L) 36.0 - 46.0 %   MCV 84.3 80.0 - 100.0 fL   MCH 27.1 26.0 - 34.0 pg   MCHC 32.1 30.0 - 36.0 g/dL   RDW 13.7 11.5 - 15.5 %   Platelets 162 150 - 400 K/uL   nRBC 0.3 (H) 0.0 - 0.2 %   Neutrophils Relative % 89 %   Neutro Abs 5.9 1.7 - 7.7 K/uL   Lymphocytes Relative 6 %   Lymphs Abs 0.4 (L) 0.7 - 4.0 K/uL   Monocytes Relative 4 %   Monocytes Absolute 0.2  0.1 - 1.0 K/uL   Eosinophils Relative 0 %   Eosinophils Absolute 0.0 0.0 - 0.5 K/uL   Basophils Relative 0 %   Basophils Absolute 0.0 0.0 - 0.1 K/uL   Immature Granulocytes 1 %   Abs Immature Granulocytes 0.06 0.00 - 0.07 K/uL    Comment: Performed at [facility], [place] [address] [place],  [address]  Resp Panel by RT-PCR (Flu A&B, Covid) Nasopharyngeal Swab     Status: None   Collection Time: 03/16/21 10:39 AM   Specimen: Nasopharyngeal Swab; Nasopharyngeal(NP) swabs in vial transport medium  Result Value Ref Range   SARS Coronavirus 2 by RT PCR NEGATIVE NEGATIVE    Comment: (NOTE) SARS-CoV-2 target nucleic acids are NOT DETECTED.  The SARS-CoV-2 RNA is generally detectable in upper respiratory specimens during the acute phase of infection. The lowest concentration of SARS-CoV-2 viral copies this assay can detect is 138 copies/mL. A negative result does not preclude SARS-Cov-2 infection and should not be used as the sole basis for treatment or other patient management decisions. A negative result may occur with  improper specimen collection/handling, submission of specimen other than nasopharyngeal swab, presence of viral mutation(s) within the areas targeted by this assay, and inadequate number of viral copies(<138 copies/mL). A negative result must be combined with clinical observations, patient history, and epidemiological information. The expected result is Negative.  Fact Sheet for Patients:  EntrepreneurPulse.com.au  Fact Sheet for Healthcare Providers:  IncredibleEmployment.be  This test is no t yet approved or cleared by the [place] FDA and  has been authorized for detection and/or diagnosis of SARS-CoV-2 by FDA under an Emergency Use Authorization (EUA). This EUA will remain  in effect (meaning this test can be used) for the duration of the COVID-19 declaration under Section 564(b)(1) of the Act,  21 U.S.C.section 360bbb-3(b)(1), unless the authorization is terminated  or revoked sooner.       Influenza A by PCR NEGATIVE NEGATIVE   Influenza B by PCR NEGATIVE NEGATIVE    Comment: (NOTE) The Xpert Xpress SARS-CoV-2/FLU/RSV plus assay is intended as an aid in the diagnosis of influenza from Nasopharyngeal swab specimens and should not be used as a sole basis for treatment. Nasal washings and aspirates are unacceptable for Xpert Xpress SARS-CoV-2/FLU/RSV testing.  Fact Sheet for Patients: EntrepreneurPulse.com.au  Fact Sheet for Healthcare Providers: IncredibleEmployment.be  This test is not yet approved or cleared by the [place] FDA and has been authorized for detection and/or diagnosis of SARS-CoV-2 by FDA under an Emergency Use Authorization (EUA). This EUA will remain in effect (meaning this test can be used) for the duration of the COVID-19 declaration under Section 564(b)(1) of the Act, 21 U.S.C. section 360bbb-3(b)(1), unless the authorization is terminated or revoked.  Performed at [facility], [place] [address], [place], [place] [address]   Protime-INR     Status: None   Collection Time: 03/16/21 10:42 AM  Result Value Ref Range   Prothrombin Time 14.4 11.4 - 15.2 seconds   INR 1.1 0.8 - 1.2    Comment: (NOTE) INR goal varies based on device and disease states. Performed at [facility], [place] [address], [place], [place] [address]   APTT     Status: None   Collection Time: 03/16/21 10:42 AM  Result Value Ref Range   aPTT 25 24 - 36 seconds    Comment: Performed at [facility], [place] [address], [place],  [address]   CT ABDOMEN PELVIS W CONTRAST  Result Date: 03/15/2021 CLINICAL DATA:  Abdominal pain and fever. Persistent postprocedural fistula. New onset of fever and vomiting. Increased abdominal pain. History of enterocutaneous fistula. Wound debridement  03/11/2021 EXAM: CT ABDOMEN AND PELVIS WITH CONTRAST TECHNIQUE: Multidetector CT imaging of the abdomen and pelvis was performed using the standard protocol following bolus administration of intravenous contrast. CONTRAST:  46m OMNIPAQUE IOHEXOL  350 MG/ML SOLN COMPARISON:  Most recent CT 02/28/2021 FINDINGS: Lower chest: Trace right pleural effusion, increased from prior. No acute airspace disease. Central line tip in the SVC, partially included. Hepatobiliary: No focal hepatic lesion. There is no intrahepatic fluid collection. Thin sliver of fluid adjacent to the anterior right lobe of the liver, series 2, image 22 measures 0.9 x 2.3 x 1.6 cm, no definite peripheral enhancement. No internal air. Clips in the gallbladder fossa postcholecystectomy. Stable biliary prominence postcholecystectomy. Pancreas: No ductal dilatation or inflammation. Spleen: Normal in size without focal abnormality. Adrenals/Urinary Tract: Normal adrenal glands. No hydronephrosis or perinephric edema. Homogeneous renal enhancement with symmetric excretion on delayed phase imaging. Urinary bladder is physiologically distended without wall thickening. Stomach/Bowel: Mild perigastric stranding is similar to prior exam, series 2, image 28. Equivocal distal gastric wall thickening. No evidence of small-bowel obstruction. Small bowel loops and transverse colon again appose the anterior abdominal wall in the region of suspected fistula. There is slight increased contrast collection in the anterior abdominal wall, series 2, image 51, with contrast collection currently measuring approximately 2.7 x 1.4 cm, previously 1.3 cm. The enteric contrast on the current exam has progressed beyond the small bowel and is seen throughout the colon. There is no colonic wall thickening or colonic inflammation. Vascular/Lymphatic: Normal caliber abdominal aorta. Patent portal and splenic veins. No portal venous or mesenteric gas. Perigastric lymph node or nodularity  measuring 10 mm, not significantly changed from prior exam, series 2, image 25. No other enlarged lymph nodes. Reproductive: Hysterectomy without adnexal mass. Minimal soft tissue thickening adjacent to the vaginal cuff is stable. Other: Increased fat stranding and air contrast collection in the anterior abdominal wall at site of known fistula, with tract extending to the skin surface, currently 2.7 x 1.4 cm, previously 1.3 cm. There is no discrete drainable subcutaneous collection. No drainable abdominopelvic collection. There may be trace free fluid in the pelvis. Mild edema the small bowel mesentery is similar. There is no free air. Musculoskeletal: There are no acute or suspicious osseous abnormalities. IMPRESSION: 1. Increased fat stranding and increased size of air contrast collection in the anterior abdominal wall at site of known enterocutaneous fistula, with tract extending to the skin surface. This currently measures 2.7 x 1.4 cm, previously 1.3 cm. 2. No new subcutaneous collection or abscess. 3. Thin sliver of fluid adjacent to the anterior right lobe of the liver measures 0.9 x 2.3 x 1.6 cm, no definite peripheral enhancement or internal air. This likely represents postoperative fluid, of indeterminate sterility. 4. Equivocal distal gastric wall thickening. 5. Trace right pleural effusion. 6. Perigastric lymph node or nodularity measuring 10 mm in the gastrohepatic ligament, not significantly changed from recent exam. Electronically Signed   By: Keith Rake M.D.   On: 03/15/2021 16:32   DG Chest Port 1 View  Result Date: 03/16/2021 CLINICAL DATA:  45 year old female with possible sepsis. Recent abdominal surgery. EXAM: PORTABLE CHEST 1 VIEW COMPARISON:  Portable chest 11/25/2019. FINDINGS: Portable AP semi upright view at 1054 hours. Stable low lung volumes. Stable right chest power port. Normal cardiac size and mediastinal contours. Visualized tracheal air column is within normal limits.  Allowing for portable technique the lungs are clear. Paucity of bowel gas in the upper abdomen. No acute osseous abnormality identified. IMPRESSION: No acute cardiopulmonary abnormality. Electronically Signed   By: Genevie Ann M.D.   On: 03/16/2021 11:22    Review of Systems  Constitutional:  Positive for chills and fever.  HENT:  Negative for sore throat.   Respiratory:  Negative for cough and shortness of breath.   Cardiovascular:  Negative for chest pain.  Gastrointestinal:  Positive for abdominal pain, nausea and vomiting.  Genitourinary:  Negative for dysuria.  Skin:  Negative for rash.   Blood pressure 105/66, pulse (!) 105, temperature 99.6 F (37.6 C), temperature source Oral, resp. rate 18, height '5\' 2"'$  (1.575 m), weight 87.5 kg, last menstrual period 07/05/2019, SpO2 95 %. Physical Exam Constitutional:      General: She is not in acute distress.    Appearance: She is not toxic-appearing.  Chest:  Breasts:    Right: Normal. No swelling or tenderness.     Comments: Port-a-cath site w/o erythema Abdominal:     General: There is no distension.     Palpations: Abdomen is soft.     Tenderness: There is no abdominal tenderness.     Comments: Midline superficial wound separation.  No erythema, induration, palpable abnormality.  Fistula draining green bowel contents.  Neurological:     Mental Status: She is alert.     Assessment/Plan Pt w/a h/o of EOC, now w/an enterocutaneous fistula.  Imaging suggestive of possible enlargement of the fistulous tract.  She has an early sepsis picture w/positive a positive blood cx although the S. Epidermidis may represent a contaminant.  Other considerations include CRBSI vs colonization.  No obvious abscess or drainable collection on imaging.  Mild LFT elevations in the setting of TPN  >Admit >ID consult >broad spectrum abx, antifungal for now >hold TPN; may need to remove the port-a-cath >IV hydration  Lahoma Crocker, MD 03/16/2021, 1:26  PM

## 2021-03-16 NOTE — Progress Notes (Signed)
PHARMACY - PHYSICIAN COMMUNICATION CRITICAL VALUE ALERT - BLOOD CULTURE IDENTIFICATION (BCID)  Sarah Newman is an 45 y.o. female who presented to [facility] on 03/16/2021 with a chief complaint of fever  Assessment:  1/2 GPC with yeast  Name of physician (or Provider) Contacted: [place]  Current antibiotics: Eraxis, Vancomycin, Cefepime, Flagyl  Changes to prescribed antibiotics recommended:  Patient is on recommended antibiotics - No changes needed  Results for orders placed or performed during the hospital encounter of 03/15/21  Blood Culture ID Panel (Reflexed) (Collected: 03/15/2021 12:20 PM)  Result Value Ref Range   Enterococcus faecalis NOT DETECTED NOT DETECTED   Enterococcus Faecium NOT DETECTED NOT DETECTED   Listeria monocytogenes NOT DETECTED NOT DETECTED   Staphylococcus species DETECTED (A) NOT DETECTED   Staphylococcus aureus (BCID) NOT DETECTED NOT DETECTED   Staphylococcus epidermidis DETECTED (A) NOT DETECTED   Staphylococcus lugdunensis NOT DETECTED NOT DETECTED   Streptococcus species NOT DETECTED NOT DETECTED   Streptococcus agalactiae NOT DETECTED NOT DETECTED   Streptococcus pneumoniae NOT DETECTED NOT DETECTED   Streptococcus pyogenes NOT DETECTED NOT DETECTED   A.calcoaceticus-baumannii NOT DETECTED NOT DETECTED   Bacteroides fragilis NOT DETECTED NOT DETECTED   Enterobacterales NOT DETECTED NOT DETECTED   Enterobacter cloacae complex NOT DETECTED NOT DETECTED   Escherichia coli NOT DETECTED NOT DETECTED   Klebsiella aerogenes NOT DETECTED NOT DETECTED   Klebsiella oxytoca NOT DETECTED NOT DETECTED   Klebsiella pneumoniae NOT DETECTED NOT DETECTED   Proteus species NOT DETECTED NOT DETECTED   Salmonella species NOT DETECTED NOT DETECTED   Serratia marcescens NOT DETECTED NOT DETECTED   Haemophilus influenzae NOT DETECTED NOT DETECTED   Neisseria meningitidis NOT DETECTED NOT DETECTED   Pseudomonas aeruginosa NOT DETECTED NOT DETECTED    Stenotrophomonas maltophilia NOT DETECTED NOT DETECTED   Candida albicans NOT DETECTED NOT DETECTED   Candida auris NOT DETECTED NOT DETECTED   Candida glabrata NOT DETECTED NOT DETECTED   Candida krusei NOT DETECTED NOT DETECTED   Candida parapsilosis DETECTED (A) NOT DETECTED   Candida tropicalis NOT DETECTED NOT DETECTED   Cryptococcus neoformans/gattii NOT DETECTED NOT DETECTED   Methicillin resistance mecA/C DETECTED (A) NOT DETECTED    Dolly Rias RPh 03/16/2021, 9:53 PM

## 2021-03-16 NOTE — Progress Notes (Signed)
Pharmacy Antibiotic Note  Sarah Newman is a 45 y.o. female admitted on 03/16/2021 with EC fistula, Candida parapsilosis candidemia, Staph epi bacteremia, sepsis. Pharmacy has been consulted for Vancomycin dosing.   Plan: Vancomycin '1750mg'$  IV x 1 given in the ED. Continue with Vancomycin 1g IV q12h.  Cefepime, Metronidazole, Anidulafungin per MD. Monitor renal function, cultures, clinical course, ID recommendations.   Height: '5\' 2"'$  (157.5 cm) Weight: 87.5 kg (193 lb) IBW/kg (Calculated) : 50.1  Temp (24hrs), Avg:100.8 F (38.2 C), Min:99.6 F (37.6 C), Max:102.8 F (39.3 C)  Recent Labs  Lab 03/11/21 1050 03/14/21 1301 03/15/21 1137 03/15/21 1138 03/16/21 1024 03/16/21 1255  WBC  --  5.1 8.7  --  6.6  --   CREATININE 0.50 0.71 0.62  --  0.40*  --   LATICACIDVEN  --   --   --  1.7 1.3 1.1    Estimated Creatinine Clearance: 93.2 mL/min (A) (by C-G formula based on SCr of 0.4 mg/dL (L)).    Allergies  Allergen Reactions   Carboplatin Shortness Of Breath    Antimicrobials this admission: 8/13 Vancomycin >> 8/13 Cefepime >> 8/13 Metronidazole >> 8/13 Anidulafungin >>  Dose adjustments this admission: --  Microbiology results: 8/12 BCx: 1/2 sets with MR-staph epi and Candida parapsilosis per BCID  8/13 BCx:  8/13 UCx:   8/13 Resp panel: negative   Thank you for allowing pharmacy to be a part of this patient's care.   Lindell Spar, PharmD, BCPS Clinical Pharmacist  03/16/2021 4:04 PM

## 2021-03-16 NOTE — ED Provider Notes (Signed)
[facility] DEPT Provider Note   CSN: [id number] Arrival date & time: 03/16/21  [id number]     History No chief complaint on file.   Sarah Newman is a 45 y.o. female.  Tiyana Kerkstra has a history of ovarian cancer diagnosed November 2020.  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  She presented to the ER yesterday with complaints of fever.  She had an extensive evaluation including bedside consultation by gynecology/oncology.  She was discharged home, but her blood cultures grew staph epidermidis and a candidal species.  Therefore, she was called back into the ED.  She complains of persistent fever and chills.  She also complains of abdominal pain around the site of her enterocutaneous fistula.  She has also had some nausea and vomiting.  The history is provided by the patient.  Fever Max temp prior to arrival:  102 Temp source:  Oral Severity:  Moderate Onset quality:  Gradual Duration:  2 days Timing:  Constant Progression:  Worsening Chronicity:  New Relieved by:  Nothing Worsened by:  Nothing Ineffective treatments:  Ibuprofen and acetaminophen Associated symptoms: nausea and vomiting   Associated symptoms: no chest pain, no chills, no cough, no diarrhea, no dysuria, no ear pain, no rash and no sore throat       Past Medical History:  Diagnosis Date  . Anemia due to antineoplastic chemotherapy   . Arthritis   . Asthma due to seasonal allergies    01-21-2021 per pt seasonal and exercised induced, does not have inhaler, last used one few years  . Chronic abdominal pain 01/2021  . Chronic nausea    01-21-2021  per pt intermittant nausea and when has abd pain most of time will have vomiting  . Family history of breast cancer   . Family history of colon cancer   . Family history of ovarian cancer   . Family history of prostate cancer   . History of 2019 novel coronavirus disease (COVID-19)  09/08/2020   positive results in epic, per pt mild symptoms that resolved  . History of Helicobacter pylori infection 07/2016   E[facility] w/ bx's by [facility],  chronic h.pylori w/ gastritis,  treated  (no ulcer)  . Malignant neoplasm of left ovary [facility]) 07/2019   oncologist--- dr gorsuch/ dr Berline Lopes--- 07-21-2019 s/p exp. 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;  progression w/ abdominal carcinomatosis  . On total parenteral nutrition (TPN) 02/02/2021   infused thru PAC  . Port-A-Cath in place   . Steroid-induced diabetes ([place])    followed by dr Alvy Bimler    Patient Active Problem List   Diagnosis Date Noted  . Post-operative pain   . Fever postop 03/14/2021  . 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 Postoperative abscess 02/02/2021  . Abscess   . Intermittent small bowel obstruction due to adhesions ([place]) 01/23/2021  . Intra-abdominal adhesions   . Nausea without vomiting 01/11/2021  . SBO (small bowel obstruction) ([place]) 01/11/2021  . Poor dentition 12/10/2020  . Pancytopenia, acquired ([place]) 12/10/2020  . Allergic reaction 10/15/2020  . Physical debility 10/01/2020  . Steroid-induced diabetes ([place]) 09/20/2020  . Constipation 09/20/2020  . 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 Lab test positive for detection of COVID-19 virus 09/10/2020  . Hypokalemia 08/30/2020  . Obesity, Class II, BMI 35-39.9 07/17/2020  . Anemia, chronic disease 05/29/2020  . UTI (urinary tract infection) 01/09/2020  . Dysuria 01/06/2020  .  Anemia due to antineoplastic chemotherapy 12/05/2019  . Gastritis 12/05/2019  . 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/2019  . Weight gain 10/25/2019  . Vaginal dryness 10/24/2019  . Drug-induced hyperglycemia 10/03/2019  . Genetic testing 09/23/2019  . Bone pain 09/13/2019  . Restless leg 09/13/2019  . Elevated liver enzymes 09/13/2019  . Family history of ovarian cancer   . Family history of  prostate cancer   . Family history of colon cancer   . Family history of breast cancer   . 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HCC)   . Pelvic mass in female 07/11/2019  . Obesity 07/11/2019  . Cervical high risk HPV (human papillomavirus) test positive 02/17/2017  . Cholecystitis 02/16/2015    Past Surgical History:  Procedure Laterality Date  . CHOLECYSTECTOMY N/A 02/16/2015   Procedure: LAPAROSCOPIC CHOLECYSTECTOMY WITH INTRAOPERATIVE CHOLANGIOGRAM;  Surgeon: Johnathan Hausen, MD;  Location: [facility];  Service: General;  Laterality: N/A;  . INCISION AND DRAINAGE ABSCESS N/A 02/02/2021   Procedure: INCISION AND DRAINAGE ABSCESS;  Surgeon: Lahoma Crocker, MD;  Location: [facility];  Service: Gynecology;  Laterality: N/A;  . IR IMAGING GUIDED PORT INSERTION  08/12/2019  . LAPAROSCOPY N/A 01/23/2021   Procedure: LAPAROSCOPY DIAGNOSTIC;  Surgeon: Lafonda Mosses, MD;  Location: [facility];  Service: Gynecology;  Laterality: N/A;  . LAPAROTOMY N/A 01/23/2021   Procedure: LAPAROTOMY, ENTEROTOMY REPAIR OF THE BOWEL;  Surgeon: Lafonda Mosses, MD;  Location[facility];  Service: Gynecology;  Laterality: N/A;  . SALPINGOOPHORECTOMY N/A 07/21/2019   Procedure: EXPLORATORY LAPAROTOMY, TOTAL ABDOMINAL HYSTERECTOMY, BILATERAL SALPINGO OOPHORECTOMY, OMENTECTOMY;  Surgeon: Lafonda Mosses, MD;  Location: [facility];  Service: Gynecology;  Laterality: N/A;  . UPPER GASTROINTESTINAL ENDOSCOPY  08/01/2016   by dr Corinda Gubler in epic  . WOUND DEBRIDEMENT N/A 03/11/2021   Procedure: OPENING AND EXPLORATION OF ABDOMINAL EXCISION;  Surgeon: Lafonda Mosses, MD;  Location: [facility];  Service: Gynecology;  Laterality: N/A;     OB History     Gravida  3   Para  3   Term      Preterm      AB      Living  2      SAB      IAB      Ectopic      Multiple      Live Births  2           Family History  Problem Relation Age of Onset  .  Colon cancer Father 19  . Prostate cancer Father 57  . Colon cancer Paternal Aunt        dx. early 56s  . Ovarian cancer Paternal Grandmother 39  . Breast cancer Cousin   . Endometriosis Mother   . Cancer Paternal Uncle        unknown type, dx. early 45s  . Cancer Paternal Uncle 71       unknown type  . Esophageal cancer Neg Hx     Social History   Tobacco Use  . Smoking status: Never  . Smokeless tobacco: Never  Vaping Use  . Vaping Use: Never used  Substance Use Topics  . Alcohol use: Yes    Comment: Social drinker  . Drug use: Never    Home Medications Prior to Admission medications   Medication Sig Start Date End Date Taking? Authorizing Provider  Accu-Chek Softclix Lancets lancets Use as instructed 02/10/21   Lafonda Mosses, MD  acetaminophen (TYLENOL) 325 MG tablet Take 650 mg by mouth every 6 (six) hours as needed for mild pain, fever or headache.    [provider]  acetaminophen (TYLENOL) 650 MG suppository Place 1 suppository (650 mg total) rectally every 6 (six) hours as needed for moderate pain. Patient not taking: Reported on 03/15/2021 02/09/21   Lafonda Mosses, MD  amoxicillin-clavulanate (AUGMENTIN) 875-125 MG tablet Take 1 tablet by mouth 2 (two) times daily for 7 days. 7 day supply 03/15/21 03/22/21  Lucrezia Starch, MD  cephALEXin (KEFLEX) 500 MG capsule Take 1 capsule by mouth 4 times daily. 03/14/21   Joylene John D, NP  glucose blood test strip Use as instructed 02/10/21   Lafonda Mosses, MD  HYDROmorphone HCl (DILAUDID) 1 MG/ML LIQD Take 1 mL (1 mg total) by mouth every 4 (four) hours as needed for severe pain or moderate pain. 02/09/21   Lafonda Mosses, MD  insulin aspart (NOVOLOG) 100 UNIT/ML injection Inject 0-15 Units into the skin 4 (four) times daily. Correction coverage: Moderate (average weight, post-op) CBG < 70: Implement Hypoglycemia Standing Orders and refer to Hypoglycemia Standing Orders sidebar report CBG 70 - 120:  0 units CBG 121 - 150: 2 units CBG 151 - 200: 3 units CBG 201 - 250: 5 units CBG 251 - 300: 8 units CBG 301 - 350: 11 units CBG 351 - 400: 15 units 02/10/21   Lafonda Mosses, MD  lidocaine-prilocaine (EMLA) cream APPLY TO THE AFFECTED AREA AS DIRECTED TO ACCESS PORT Patient not taking: Reported on 03/15/2021 10/22/20 10/22/21  Heath Lark, MD  LORazepam (ATIVAN) 0.5 MG tablet Take 1 tablet by mouth every 8 hours as needed for anxiety/insomnia. Do not take and drive. 03/12/21   Joylene John D, NP  omeprazole (PRILOSEC) 40 MG capsule Take 1 capsule (40 mg total) by mouth daily. 02/09/21   Lafonda Mosses, MD  ondansetron (ZOFRAN ODT) 4 MG disintegrating tablet Take 1 tablet (4 mg total) by mouth every 4 (four) hours as needed for up to 20 doses for nausea or vomiting. 02/22/21   Lafonda Mosses, MD  oxyCODONE (ROXICODONE) 5 MG immediate release tablet Take 1 tablet (5 mg total) by mouth every 4 (four) hours as needed for up to 5 days for severe pain. 03/15/21 03/20/21  Kommor, Debe Coder, MD  promethazine (PHENERGAN) 12.5 MG suppository Place 1 suppository (12.5 mg total) rectally every 8 (eight) hours as needed for up to 30 doses for nausea or vomiting. 03/15/21   Lucrezia Starch, MD    Allergies    Carboplatin  Review of Systems   Review of Systems  Constitutional:  Positive for fever. Negative for chills.  HENT:  Negative for ear pain and sore throat.   Eyes:  Negative for pain and visual disturbance.  Respiratory:  Negative for cough and shortness of breath.   Cardiovascular:  Negative for chest pain and palpitations.  Gastrointestinal:  Positive for abdominal pain, nausea and vomiting. Negative for diarrhea.  Genitourinary:  Negative for dysuria and hematuria.  Musculoskeletal:  Negative for arthralgias and back pain.  Skin:  Positive for wound. Negative for color change and rash.  Neurological:  Negative for seizures and syncope.  All other systems reviewed and are  negative.  Physical Exam Updated Vital Signs BP (!) 157/107   Pulse (!) 107   Temp (!) 102.2 F (39 C) (Oral)   Resp (!) 24   Ht '5\' 2"'$  (1.575 m)   Wt 87.5  kg   LMP 07/05/2019   SpO2 100%   BMI 35.30 kg/m   Physical Exam Vitals and nursing note reviewed.  Constitutional:      Appearance: She is well-developed.  HENT:     Head: Normocephalic and atraumatic.  Cardiovascular:     Rate and Rhythm: Regular rhythm. Tachycardia present.     Heart sounds: Normal heart sounds.  Pulmonary:     Effort: Pulmonary effort is normal. No tachypnea.     Breath sounds: Normal breath sounds.  Chest:       Comments: Port at right upper chest. Dressed in clean dressing. Tip appears clean. No surrounding erythema. Abdominal:     General: There is no distension.     Palpations: Abdomen is soft.     Tenderness: There is abdominal tenderness.     Comments: Wound at left mid-abdomen draining serosanguinous fluid. Minimal erythema around the wound. Mild, diffuse tenderness over the central abdomen without guarding or rebound.  Musculoskeletal:     Right lower leg: No edema.     Left lower leg: No edema.  Skin:    General: Skin is warm and dry.  Neurological:     General: No focal deficit present.     Mental Status: She is alert and oriented to person, place, and time.  Psychiatric:        Mood and Affect: Mood normal.        Behavior: Behavior normal.    ED Results / Procedures / Treatments   Labs (all labs ordered are listed, but only abnormal results are displayed) Labs Reviewed  COMPREHENSIVE METABOLIC PANEL - Abnormal; Notable for the following components:      Result Value   Glucose, Bld 241 (*)    Creatinine, Ser 0.40 (*)    Calcium 8.7 (*)    Albumin 3.2 (*)    AST 232 (*)    ALT 181 (*)    Alkaline Phosphatase 140 (*)    All other components within normal limits  CBC WITH DIFFERENTIAL/PLATELET - Abnormal; Notable for the following components:   RBC 3.62 (*)    Hemoglobin  9.8 (*)    HCT 30.5 (*)    nRBC 0.3 (*)    Lymphs Abs 0.4 (*)    All other components within normal limits  RESP PANEL BY RT-PCR (FLU A&B, COVID) ARPGX2  CULTURE, BLOOD (ROUTINE X 2)  CULTURE, BLOOD (ROUTINE X 2)  URINE CULTURE  LACTIC ACID, PLASMA  LACTIC ACID, PLASMA  URINALYSIS, ROUTINE W REFLEX MICROSCOPIC  PROTIME-INR  APTT    EKG EKG Interpretation  Date/Time:  Saturday March 16 2021 10:24:59 EDT Ventricular Rate:  112 PR Interval:  138 QRS Duration: 85 QT Interval:  303 QTC Calculation: 414 R Axis:   50 Text Interpretation: Sinus tachycardia Low voltage, precordial leads Borderline T abnormalities, anterior leads normal axis No acute ischemia Confirmed by Lorre Munroe ([id number]) on 03/16/2021 11:23:51 AM  Radiology CT ABDOMEN PELVIS W CONTRAST  Result Date: 03/15/2021 CLINICAL DATA:  Abdominal pain and fever. Persistent postprocedural fistula. New onset of fever and vomiting. Increased abdominal pain. History of enterocutaneous fistula. Wound debridement 03/11/2021 EXAM: CT ABDOMEN AND PELVIS WITH CONTRAST TECHNIQUE: Multidetector CT imaging of the abdomen and pelvis was performed using the standard protocol following bolus administration of intravenous contrast. CONTRAST:  67m OMNIPAQUE IOHEXOL 350 MG/ML SOLN COMPARISON:  Most recent CT 02/28/2021 FINDINGS: Lower chest: Trace right pleural effusion, increased from prior. No acute airspace disease. Central line tip in  the SVC, partially included. Hepatobiliary: No focal hepatic lesion. There is no intrahepatic fluid collection. Thin sliver of fluid adjacent to the anterior right lobe of the liver, series 2, image 22 measures 0.9 x 2.3 x 1.6 cm, no definite peripheral enhancement. No internal air. Clips in the gallbladder fossa postcholecystectomy. Stable biliary prominence postcholecystectomy. Pancreas: No ductal dilatation or inflammation. Spleen: Normal in size without focal abnormality. Adrenals/Urinary Tract: Normal adrenal  glands. No hydronephrosis or perinephric edema. Homogeneous renal enhancement with symmetric excretion on delayed phase imaging. Urinary bladder is physiologically distended without wall thickening. Stomach/Bowel: Mild perigastric stranding is similar to prior exam, series 2, image 28. Equivocal distal gastric wall thickening. No evidence of small-bowel obstruction. Small bowel loops and transverse colon again appose the anterior abdominal wall in the region of suspected fistula. There is slight increased contrast collection in the anterior abdominal wall, series 2, image 51, with contrast collection currently measuring approximately 2.7 x 1.4 cm, previously 1.3 cm. The enteric contrast on the current exam has progressed beyond the small bowel and is seen throughout the colon. There is no colonic wall thickening or colonic inflammation. Vascular/Lymphatic: Normal caliber abdominal aorta. Patent portal and splenic veins. No portal venous or mesenteric gas. Perigastric lymph node or nodularity measuring 10 mm, not significantly changed from prior exam, series 2, image 25. No other enlarged lymph nodes. Reproductive: Hysterectomy without adnexal mass. Minimal soft tissue thickening adjacent to the vaginal cuff is stable. Other: Increased fat stranding and air contrast collection in the anterior abdominal wall at site of known fistula, with tract extending to the skin surface, currently 2.7 x 1.4 cm, previously 1.3 cm. There is no discrete drainable subcutaneous collection. No drainable abdominopelvic collection. There may be trace free fluid in the pelvis. Mild edema the small bowel mesentery is similar. There is no free air. Musculoskeletal: There are no acute or suspicious osseous abnormalities. IMPRESSION: 1. Increased fat stranding and increased size of air contrast collection in the anterior abdominal wall at site of known enterocutaneous fistula, with tract extending to the skin surface. This currently measures  2.7 x 1.4 cm, previously 1.3 cm. 2. No new subcutaneous collection or abscess. 3. Thin sliver of fluid adjacent to the anterior right lobe of the liver measures 0.9 x 2.3 x 1.6 cm, no definite peripheral enhancement or internal air. This likely represents postoperative fluid, of indeterminate sterility. 4. Equivocal distal gastric wall thickening. 5. Trace right pleural effusion. 6. Perigastric lymph node or nodularity measuring 10 mm in the gastrohepatic ligament, not significantly changed from recent exam. Electronically Signed   By: Keith Rake M.D.   On: 03/15/2021 16:32    Procedures .Critical Care  Date/Time: 03/16/2021 11:31 AM Performed by: Arnaldo Natal, MD Authorized by: Arnaldo Natal, MD   Critical care provider statement:    Critical care time (minutes):  45   Critical care time was exclusive of:  Separately billable procedures and treating other patients and teaching time   Critical care was necessary to treat or prevent imminent or life-threatening deterioration of the following conditions:  Sepsis and hepatic failure   Critical care was time spent personally by me on the following activities:  Blood draw for specimens, development of treatment plan with patient or surrogate, discussions with consultants, evaluation of patient's response to treatment, examination of patient, obtaining history from patient or surrogate, ordering and performing treatments and interventions, ordering and review of laboratory studies, ordering and review of radiographic studies, pulse oximetry, re-evaluation of  patient's condition and review of old charts   I assumed direction of critical care for this patient from another provider in my specialty: no     Care discussed with: admitting provider     Medications Ordered in ED Medications  lactated ringers infusion (has no administration in time range)  lactated ringers bolus 1,000 mL (has no administration in time range)    And  lactated ringers  bolus 1,000 mL (1,000 mLs Intravenous New Bag/Given 03/16/21 1059)    And  lactated ringers bolus 1,000 mL (has no administration in time range)  anidulafungin (ERAXIS) 200 mg in sodium chloride 0.9 % 200 mL IVPB (has no administration in time range)    Followed by  anidulafungin (ERAXIS) 100 mg in sodium chloride 0.9 % 100 mL IVPB (has no administration in time range)  vancomycin (VANCOREADY) IVPB 1750 mg/350 mL (1,750 mg Intravenous New Bag/Given 03/16/21 1105)  acetaminophen (TYLENOL) tablet 650 mg (650 mg Oral Given 03/16/21 1101)  HYDROmorphone (DILAUDID) injection 1 mg (1 mg Intravenous Given 03/16/21 1123)  ondansetron (ZOFRAN) injection 4 mg (4 mg Intravenous Given 03/16/21 1124)    ED Course  I have reviewed the triage vital signs and the nursing notes.  Pertinent labs & imaging results that were available during my care of the patient were reviewed by me and considered in my medical decision making (see chart for details).  Clinical Course as of 03/16/21 1144  Sat Mar 16, 2021  1124 I spoke with Dr. Delsa Sale of Gyn-Onc.  We will admit the patient and will see her later in the day.  She did want to broaden antibiotic coverage, and she will call pharmacy.  Consult placed for their expertise.  Patient will be treated symptomatically in the meantime. [AW]  11 Dr. Delsa Sale called back. Recommends cefepime and flagyl additionally. [AW]    Clinical Course User Index [AW] Arnaldo Natal, MD   MDM Rules/Calculators/A&P                           [name] has a history of ovarian cancer and has developed complications, most recently an enterocutaneous fistula.  She presented to the ER yesterday with complaints of fever and was discharged home with oral antibiotics after extensive work-up and consultation.  She was called back today for positive blood cultures.  At this point, she was treated for both staph bacteremia and candidemia.  Further blood cultures were drawn  today.  Her port is a potential source.  Her enterocutaneous fistula and the fluid collection around her liver is another possible source.  She will be admitted for further treatment and monitoring.  She is receiving IV fluids per sepsis algorithm as well as symptomatic management in the form of pain control and antiemetics. Final Clinical Impression(s) / ED Diagnoses Final diagnoses:  Sepsis due to Candida species with acute liver failure without hepatic coma or septic shock (HCC)  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HCC)  Enterocutaneous fistula  Abdominal carcinomatosis [facility])    Rx / DC Orders ED Discharge Orders     None        Arnaldo Natal, MD 03/16/21 1133

## 2021-03-16 NOTE — ED Notes (Signed)
Dorinda Hill RN was informed of admission bed assignment

## 2021-03-16 NOTE — ED Triage Notes (Signed)
Patient seen yesterday for fever, abd pain and n/v after recent abdominal surgery. Hx of ovarian cancer. Reports fever has not subsided and pain is worse today.

## 2021-03-16 NOTE — Progress Notes (Signed)
A consult was received from an ED physician for Vancomycin per pharmacy dosing.  The patient's profile has been reviewed for ht/wt/allergies/indication/available labs.    A one time order has been placed for Vancomycin '1750mg'$  IV.  Further antibiotics/pharmacy consults should be ordered by admitting physician if indicated.                       Thank you, Luiz Ochoa 03/16/2021  10:38 AM

## 2021-03-16 NOTE — Sepsis Progress Note (Signed)
[facility] is monitoring this code sepsis 

## 2021-03-16 NOTE — ED Provider Notes (Signed)
Patient has positive blood cultures.  She will turn to the ED for IV medication.   Arnaldo Natal, MD 03/16/21 [phone]

## 2021-03-17 ENCOUNTER — Inpatient Hospital Stay (HOSPITAL_COMMUNITY): Payer: Medicaid Other

## 2021-03-17 DIAGNOSIS — R7881 Bacteremia: Secondary | ICD-10-CM | POA: Diagnosis not present

## 2021-03-17 LAB — CBC WITH DIFFERENTIAL/PLATELET
Abs Immature Granulocytes: 0.01 10*3/uL (ref 0.00–0.07)
Basophils Absolute: 0 10*3/uL (ref 0.0–0.1)
Basophils Relative: 0 %
Eosinophils Absolute: 0 10*3/uL (ref 0.0–0.5)
Eosinophils Relative: 0 %
HCT: 30.6 % — ABNORMAL LOW (ref 36.0–46.0)
Hemoglobin: 9.8 g/dL — ABNORMAL LOW (ref 12.0–15.0)
Immature Granulocytes: 0 %
Lymphocytes Relative: 12 %
Lymphs Abs: 0.5 10*3/uL — ABNORMAL LOW (ref 0.7–4.0)
MCH: 26.8 pg (ref 26.0–34.0)
MCHC: 32 g/dL (ref 30.0–36.0)
MCV: 83.6 fL (ref 80.0–100.0)
Monocytes Absolute: 0.2 10*3/uL (ref 0.1–1.0)
Monocytes Relative: 5 %
Neutro Abs: 3.7 10*3/uL (ref 1.7–7.7)
Neutrophils Relative %: 83 %
Platelets: 175 10*3/uL (ref 150–400)
RBC: 3.66 MIL/uL — ABNORMAL LOW (ref 3.87–5.11)
RDW: 13.8 % (ref 11.5–15.5)
WBC: 4.4 10*3/uL (ref 4.0–10.5)
nRBC: 0 % (ref 0.0–0.2)

## 2021-03-17 LAB — ECHOCARDIOGRAM COMPLETE
Area-P 1/2: 5.04 cm2
Height: 62 in
S' Lateral: 4.2 cm
Weight: 3088 oz

## 2021-03-17 LAB — COMPREHENSIVE METABOLIC PANEL
ALT: 302 U/L — ABNORMAL HIGH (ref 0–44)
AST: 380 U/L — ABNORMAL HIGH (ref 15–41)
Albumin: 3.2 g/dL — ABNORMAL LOW (ref 3.5–5.0)
Alkaline Phosphatase: 206 U/L — ABNORMAL HIGH (ref 38–126)
Anion gap: 9 (ref 5–15)
BUN: 6 mg/dL (ref 6–20)
CO2: 23 mmol/L (ref 22–32)
Calcium: 8.4 mg/dL — ABNORMAL LOW (ref 8.9–10.3)
Chloride: 98 mmol/L (ref 98–111)
Creatinine, Ser: 0.62 mg/dL (ref 0.44–1.00)
GFR, Estimated: >60 mL/min (ref >60)
Glucose, Bld: 140 mg/dL — ABNORMAL HIGH (ref 70–99)
Potassium: 3.4 mmol/L — ABNORMAL LOW (ref 3.5–5.1)
Sodium: 130 mmol/L — ABNORMAL LOW (ref 135–145)
Total Bilirubin: 0.7 mg/dL (ref 0.3–1.2)
Total Protein: 7.3 g/dL (ref 6.5–8.1)

## 2021-03-17 LAB — URINALYSIS, ROUTINE W REFLEX MICROSCOPIC
Bilirubin Urine: NEGATIVE
Glucose, UA: NEGATIVE mg/dL
Hgb urine dipstick: NEGATIVE
Ketones, ur: NEGATIVE mg/dL
Leukocytes,Ua: NEGATIVE
Nitrite: NEGATIVE
Protein, ur: NEGATIVE mg/dL
Specific Gravity, Urine: 1.004 — ABNORMAL LOW (ref 1.005–1.030)
pH: 6 (ref 5.0–8.0)

## 2021-03-17 LAB — GLUCOSE, CAPILLARY
Glucose-Capillary: 109 mg/dL — ABNORMAL HIGH (ref 70–99)
Glucose-Capillary: 115 mg/dL — ABNORMAL HIGH (ref 70–99)
Glucose-Capillary: 119 mg/dL — ABNORMAL HIGH (ref 70–99)
Glucose-Capillary: 128 mg/dL — ABNORMAL HIGH (ref 70–99)
Glucose-Capillary: 184 mg/dL — ABNORMAL HIGH (ref 70–99)

## 2021-03-17 MED ORDER — ACETAMINOPHEN 650 MG RE SUPP: 650.0000 mg | Freq: Four times a day (QID) | RECTAL | Status: DC

## 2021-03-17 MED ORDER — SODIUM CHLORIDE 0.9 % IV SOLN
12.5000 mg | Freq: Four times a day (QID) | INTRAVENOUS | Status: DC | PRN
  Filled 2021-03-17: qty 0.5

## 2021-03-17 MED ORDER — ACETAMINOPHEN 650 MG RE SUPP: 650.0000 mg | Freq: Four times a day (QID) | RECTAL | Status: DC | PRN

## 2021-03-17 MED ORDER — OXYCODONE HCL 5 MG PO TABS
5.0000 mg | ORAL_TABLET | ORAL | Status: DC | PRN
  Filled 2021-03-17: qty 1

## 2021-03-17 MED ORDER — AMITRIPTYLINE HCL 25 MG PO TABS
25.0000 mg | ORAL_TABLET | Freq: Every day | ORAL | Status: DC
  Administered 2021-03-17 – 2021-03-18 (×2): 25 mg via ORAL
  Filled 2021-03-17 (×2): qty 1

## 2021-03-17 MED ORDER — LORAZEPAM 2 MG/ML IJ SOLN: 1.0000 mg | Freq: Four times a day (QID) | INTRAMUSCULAR | Status: DC | PRN

## 2021-03-17 MED ORDER — KCL IN DEXTROSE-NACL 20-5-0.9 MEQ/L-%-% IV SOLN
INTRAVENOUS | Status: DC
  Filled 2021-03-17 (×5): qty 1000

## 2021-03-17 MED ORDER — TRAZODONE HCL 100 MG PO TABS: 100.0000 mg | ORAL_TABLET | Freq: Every evening | ORAL | Status: DC | PRN

## 2021-03-17 MED ORDER — PANTOPRAZOLE SODIUM 40 MG IV SOLR
40.0000 mg | INTRAVENOUS | Status: DC
  Administered 2021-03-18 – 2021-03-20 (×3): 40 mg via INTRAVENOUS
  Filled 2021-03-17 (×4): qty 40

## 2021-03-17 MED ORDER — ACETAMINOPHEN 650 MG RE SUPP
650.0000 mg | Freq: Four times a day (QID) | RECTAL | Status: DC | PRN
  Administered 2021-03-17: 650 mg via RECTAL

## 2021-03-17 NOTE — Progress Notes (Signed)
Brief ID note:  Patient chart reviewed.  Febrile overnight to 103.2.  WBC 4.4 this morning, LFTs trending upwards.  TPN on hold at this time.  She was able to have 2 peripheral IVs placed yesterday and is currently on anidulafungin, cefepime, metronidazole, and vancomycin.  8/12 blood cultures positive for yeast in 3 out of 3 cultures including cultures taken from her Port-A-Cath.  Currently gram-positive cocci (identified as Staph epidermidis by [facility] ID) are growing in 1 out of 3 cultures.  However, blood cultures were also obtained yesterday it appears only from her Port-A-Cath which are positive for GPC in clusters and yeast as well.  Further identification is pending.  Plan: -Recommend removal of Port-A-Cath as soon as feasibly can be done (IR eval/management ordered) -Continue anidulafungin, vancomycin, cefepime, Flagyl -Right upper quadrant ultrasound to further evaluate for abscess formation in the setting of rising LFTs (ordered) -Echo (ordered) -Repeat blood cultures x2 today (ordered) and daily until documented clearance -Hold TPN -Dr. Gale Journey is taking over tomorrow    Raynelle Highland for [facility] 03/17/2021, 8:26 AM

## 2021-03-17 NOTE — Progress Notes (Signed)
   03/17/21 0205  Assess: MEWS Score  Temp (!) 103.2 F (39.6 C)  BP (!) 125/91  Pulse Rate (!) 110  Resp 16  SpO2 98 %  Assess: MEWS Score  MEWS Temp 2  MEWS Systolic 0  MEWS Pulse 1  MEWS RR 0  MEWS LOC 0  MEWS Score 3  MEWS Score Color Yellow  Assess: if the MEWS score is Yellow or Red  Were vital signs taken at a resting state? Yes  Focused Assessment Change from prior assessment (see assessment flowsheet)  Does the patient meet 2 or more of the SIRS criteria? Yes  Does the patient have a confirmed or suspected source of infection? Yes  Provider and Rapid Response Notified? Yes  MEWS guidelines implemented *See Row Information* Yes  Treat  MEWS Interventions Administered prn meds/treatments  Notify: Charge Nurse/RN  Name of Charge Nurse/RN Notified Fabio Bering, rN  Date Charge Nurse/RN Notified 03/17/21  Time Charge Nurse/RN Notified 0215  Notify: Provider  Provider Name/Title Lahoma Crocker MD  Date Provider Notified 03/17/21  Time Provider Notified 0230  Notification Type Call  Notification Reason Other (Comment) (fever and mews yellow)  Provider response See new orders  Date of Provider Response 03/17/21  Time of Provider Response 0230  Assess: SIRS CRITERIA  SIRS Temperature  1  SIRS Pulse 1  SIRS Respirations  0  SIRS WBC 0  SIRS Score Sum  2   Pt is alert, VS checked, MEWS changed to Yellow, provider on call made aware, ordered for suppository tylenol for fever; will continue to monitor pt.

## 2021-03-17 NOTE — Progress Notes (Addendum)
Subjective: Interval History: has complaints difficulty falling asleep, fever/chills, nausea and abdominal pain.  Objective: Vital signs in last 24 hours: Temp:  [99.6 F (37.6 C)-103.2 F (39.6 C)] 100 F (37.8 C) (08/14 1041) Pulse Rate:  [87-110] 87 (08/14 1041) Resp:  [16-18] 18 (08/14 1041) BP: (112-135)/(59-99) 119/75 (08/14 1041) SpO2:  [96 %-100 %] 98 % (08/14 1041)  Intake/Output from previous day: 08/13 0701 - 08/14 0700 In: 4157 [P.O.:30; I.V.:3151.8] Out: 1200 [Urine:1200] Intake/Output this shift: No intake/output data recorded.  General appearance: alert Breasts: normal appearance, no masses or tenderness Abdomen: mild left epigastric tenderness, soft; green drainage in Eakin pouch Extremities: extremities normal, atraumatic, no cyanosis or edema  Results for orders placed or performed during the hospital encounter of 03/16/21 (from the past 24 hour(s))  Lactic acid, plasma     Status: None   Collection Time: 03/16/21 12:55 PM  Result Value Ref Range   Lactic Acid, Venous 1.1 0.5 - 1.9 mmol/L  Glucose, capillary     Status: Abnormal   Collection Time: 03/16/21  5:49 PM  Result Value Ref Range   Glucose-Capillary 109 (H) 70 - 99 mg/dL  Glucose, capillary     Status: Abnormal   Collection Time: 03/17/21 12:09 AM  Result Value Ref Range   Glucose-Capillary 109 (H) 70 - 99 mg/dL  Comprehensive metabolic panel     Status: Abnormal   Collection Time: 03/17/21  4:25 AM  Result Value Ref Range   Sodium 130 (L) 135 - 145 mmol/L   Potassium 3.4 (L) 3.5 - 5.1 mmol/L   Chloride 98 98 - 111 mmol/L   CO2 23 22 - 32 mmol/L   Glucose, Bld 140 (H) 70 - 99 mg/dL   BUN 6 6 - 20 mg/dL   Creatinine, Ser 0.62 0.44 - 1.00 mg/dL   Calcium 8.4 (L) 8.9 - 10.3 mg/dL   Total Protein 7.3 6.5 - 8.1 g/dL   Albumin 3.2 (L) 3.5 - 5.0 g/dL   AST 380 (H) 15 - 41 U/L   ALT 302 (H) 0 - 44 U/L   Alkaline Phosphatase 206 (H) 38 - 126 U/L   Total Bilirubin 0.7 0.3 - 1.2 mg/dL   GFR,  Estimated >60 >60 mL/min   Anion gap 9 5 - 15  CBC WITH DIFFERENTIAL     Status: Abnormal   Collection Time: 03/17/21  4:25 AM  Result Value Ref Range   WBC 4.4 4.0 - 10.5 K/uL   RBC 3.66 (L) 3.87 - 5.11 MIL/uL   Hemoglobin 9.8 (L) 12.0 - 15.0 g/dL   HCT 30.6 (L) 36.0 - 46.0 %   MCV 83.6 80.0 - 100.0 fL   MCH 26.8 26.0 - 34.0 pg   MCHC 32.0 30.0 - 36.0 g/dL   RDW 13.8 11.5 - 15.5 %   Platelets 175 150 - 400 K/uL   nRBC 0.0 0.0 - 0.2 %   Neutrophils Relative % 83 %   Neutro Abs 3.7 1.7 - 7.7 K/uL   Lymphocytes Relative 12 %   Lymphs Abs 0.5 (L) 0.7 - 4.0 K/uL   Monocytes Relative 5 %   Monocytes Absolute 0.2 0.1 - 1.0 K/uL   Eosinophils Relative 0 %   Eosinophils Absolute 0.0 0.0 - 0.5 K/uL   Basophils Relative 0 %   Basophils Absolute 0.0 0.0 - 0.1 K/uL   Immature Granulocytes 0 %   Abs Immature Granulocytes 0.01 0.00 - 0.07 K/uL  Glucose, capillary     Status: Abnormal  Collection Time: 03/17/21  5:44 AM  Result Value Ref Range   Glucose-Capillary 128 (H) 70 - 99 mg/dL  Glucose, capillary     Status: Abnormal   Collection Time: 03/17/21 11:30 AM  Result Value Ref Range   Glucose-Capillary 184 (H) 70 - 99 mg/dL    Studies/Results: CT ABDOMEN PELVIS W CONTRAST  Result Date: 03/15/2021 CLINICAL DATA:  Abdominal pain and fever. Persistent postprocedural fistula. New onset of fever and vomiting. Increased abdominal pain. History of enterocutaneous fistula. Wound debridement 03/11/2021 EXAM: CT ABDOMEN AND PELVIS WITH CONTRAST TECHNIQUE: Multidetector CT imaging of the abdomen and pelvis was performed using the standard protocol following bolus administration of intravenous contrast. CONTRAST:  64m OMNIPAQUE IOHEXOL 350 MG/ML SOLN COMPARISON:  Most recent CT 02/28/2021 FINDINGS: Lower chest: Trace right pleural effusion, increased from prior. No acute airspace disease. Central line tip in the SVC, partially included. Hepatobiliary: No focal hepatic lesion. There is no  intrahepatic fluid collection. Thin sliver of fluid adjacent to the anterior right lobe of the liver, series 2, image 22 measures 0.9 x 2.3 x 1.6 cm, no definite peripheral enhancement. No internal air. Clips in the gallbladder fossa postcholecystectomy. Stable biliary prominence postcholecystectomy. Pancreas: No ductal dilatation or inflammation. Spleen: Normal in size without focal abnormality. Adrenals/Urinary Tract: Normal adrenal glands. No hydronephrosis or perinephric edema. Homogeneous renal enhancement with symmetric excretion on delayed phase imaging. Urinary bladder is physiologically distended without wall thickening. Stomach/Bowel: Mild perigastric stranding is similar to prior exam, series 2, image 28. Equivocal distal gastric wall thickening. No evidence of small-bowel obstruction. Small bowel loops and transverse colon again appose the anterior abdominal wall in the region of suspected fistula. There is slight increased contrast collection in the anterior abdominal wall, series 2, image 51, with contrast collection currently measuring approximately 2.7 x 1.4 cm, previously 1.3 cm. The enteric contrast on the current exam has progressed beyond the small bowel and is seen throughout the colon. There is no colonic wall thickening or colonic inflammation. Vascular/Lymphatic: Normal caliber abdominal aorta. Patent portal and splenic veins. No portal venous or mesenteric gas. Perigastric lymph node or nodularity measuring 10 mm, not significantly changed from prior exam, series 2, image 25. No other enlarged lymph nodes. Reproductive: Hysterectomy without adnexal mass. Minimal soft tissue thickening adjacent to the vaginal cuff is stable. Other: Increased fat stranding and air contrast collection in the anterior abdominal wall at site of known fistula, with tract extending to the skin surface, currently 2.7 x 1.4 cm, previously 1.3 cm. There is no discrete drainable subcutaneous collection. No drainable  abdominopelvic collection. There may be trace free fluid in the pelvis. Mild edema the small bowel mesentery is similar. There is no free air. Musculoskeletal: There are no acute or suspicious osseous abnormalities. IMPRESSION: 1. Increased fat stranding and increased size of air contrast collection in the anterior abdominal wall at site of known enterocutaneous fistula, with tract extending to the skin surface. This currently measures 2.7 x 1.4 cm, previously 1.3 cm. 2. No new subcutaneous collection or abscess. 3. Thin sliver of fluid adjacent to the anterior right lobe of the liver measures 0.9 x 2.3 x 1.6 cm, no definite peripheral enhancement or internal air. This likely represents postoperative fluid, of indeterminate sterility. 4. Equivocal distal gastric wall thickening. 5. Trace right pleural effusion. 6. Perigastric lymph node or nodularity measuring 10 mm in the gastrohepatic ligament, not significantly changed from recent exam. Electronically Signed   By: MKeith RakeM.D.   On:  03/15/2021 16:32   CT Abdomen Pelvis W Contrast  Result Date: 03/01/2021 CLINICAL DATA:  History of ovarian cancer and recently diagnosed enterocutaneous fistula. EXAM: CT ABDOMEN AND PELVIS WITH CONTRAST TECHNIQUE: Multidetector CT imaging of the abdomen and pelvis was performed using the standard protocol following bolus administration of intravenous contrast. CONTRAST:  43m OMNIPAQUE IOHEXOL 350 MG/ML SOLN COMPARISON:  Comparison is made with multiple prior studies most recent comparison evaluation February 03, 2021 FINDINGS: Lower chest: No sign of effusion or consolidative changes at the lung bases. Central venous access device terminates at the caval to atrial junction Hepatobiliary: No focal, suspicious hepatic lesion. The biliary tree is mildly dilated post cholecystectomy but unchanged compared to the prior imaging. The portal vein is patent. Pancreas: Normal, without mass, inflammation or ductal dilatation. Spleen:  Normal spleen. Adrenals/Urinary Tract: Adrenal glands are normal. Symmetric renal enhancement. No perinephric stranding. No hydronephrosis. Urinary bladder collapsed without acute process. Stomach/Bowel: Mild perigastric stranding and or nodularity showing no significant change since previous imaging (image 35/2) Small bowel without signs of obstruction but closely adherent to the undersurface of the periumbilical abdominal wall. Stranding extending into the abdominal wall at the site of known intracutaneous fistula. Small collection of contrast present in the abdominal wall mixed with gas on image 61 of series 2. Diminished generalized stranding and subcutaneous emphysema along the anterior abdominal wall with only a small locule of gas seen on image 57 of series 2 slightly away from the more central area related to the fistula. Small locules of gas are present in the area of the fistula proper at the site of a large gas and contrast containing collection on the previous exam. Another small locule of gas at the lateral margin of stranding on image 63 of series 2 serving as an example. Overall the findings are much improved compared to the previous study. On coronal images the mid transverse colon appears closely applied to adjacent small bowel loops on image 19 of series 4. This is close to the area of known intracutaneous fistula. Mesenteric thickening and angulated loops of small bowel are present throughout the lower abdomen. Contrast passes into the colon. Vascular/Lymphatic: Patent abdominal vessels. Normal caliber of the abdominal aorta. Smooth contour of the IVC. Mild stranding and or adenopathy in the gastrohepatic recess on image 29 of series 2 measuring approximately 9 mm, increased in conspicuity compared to previous exams. No additional findings concerning for adenopathy in the abdomen. No pelvic adenopathy. Reproductive: Post hysterectomy. Fascial thickening in the pelvis is similar to the prior exam.  Other: Trace fluid in the pelvis in fascial thickening along with interloop mesenteric thickening in the lower abdomen in particular. No free intraperitoneal air. Diminished gas along the body wall as discussed. Musculoskeletal: No acute musculoskeletal process. Spinal degenerative changes. No destructive bone finding. IMPRESSION: 1. Persistence of intracutaneous fistula with diminished stranding and gas in the abdominal wall as described. 2. Colon is in close proximity and likely adherent to small bowel loops. 3. Mesenteric thickening and angulated loops of small bowel are present throughout the lower abdomen. Findings likely reflect changes related to malignant and inflammatory adhesions. 4. Increased soft tissue in the gastrohepatic ligament compared to prior imaging may reflect enlarging lymph node or worsening of peritoneal disease. 5. Post cholecystectomy and hysterectomy. 6. Aortic atherosclerosis. Electronically Signed   By: GZetta BillsM.D.   On: 03/01/2021 13:04   DG Chest Port 1 View  Result Date: 03/16/2021 CLINICAL DATA:  45year old female with possible sepsis.  Recent abdominal surgery. EXAM: PORTABLE CHEST 1 VIEW COMPARISON:  Portable chest 11/25/2019. FINDINGS: Portable AP semi upright view at 1054 hours. Stable low lung volumes. Stable right chest power port. Normal cardiac size and mediastinal contours. Visualized tracheal air column is within normal limits. Allowing for portable technique the lungs are clear. Paucity of bowel gas in the upper abdomen. No acute osseous abnormality identified. IMPRESSION: No acute cardiopulmonary abnormality. Electronically Signed   By: Genevie Ann M.D.   On: 03/16/2021 11:22    Scheduled Meds:  acetaminophen  650 mg Rectal Q6H   alteplase  2 mg Intracatheter Once   amitriptyline  25 mg Oral QHS   enoxaparin (LOVENOX) injection  40 mg Subcutaneous Q24H   [START ON 03/18/2021] pantoprazole (PROTONIX) IV  40 mg Intravenous Q24H   Continuous Infusions:   anidulafungin     ceFEPime (MAXIPIME) IV 2 g (03/17/21 0900)   dextrose 5 % and 0.45 % NaCl with KCl 20 mEq/L 150 mL/hr at 03/17/21 0735   metronidazole 500 mg (03/17/21 0330)   promethazine (PHENERGAN) injection (IM or IVPB)     vancomycin 1,000 mg (03/17/21 1052)   PRN Meds:HYDROmorphone (DILAUDID) injection, LORazepam, ondansetron, promethazine (PHENERGAN) injection (IM or IVPB), Zinc Oxide  Assessment/Plan: CRBSI, early sepsis.  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  Normal Abd U/S/  Likely 2/2 sepsis.  CBGs in range. FEN:  EC fistula.  Doubt excessive output.  Mild hyponatremia, possible mild dehydration 2/2 insensible loss VTE/DVT: pharmacologic/mechanical prophylaxis  >removal of port-a-cath tomorrow >continue broad spectrum antibx/antifungal >continue to hold TPN in the setting of sepsis >skin barrier >change IVF maintenance-->D5 NS w/K; serial lytes >decreased meds that are hepatotoxic >amitriptyline at bedtime, ativan prn   LOS: 1 day   Antoine Poche ID: [name], female   DOB: [birth year], 45 y.o.   MRN: [id number]

## 2021-03-17 NOTE — Progress Notes (Signed)
  Echocardiogram 2D Echocardiogram has been performed.  Darlina Sicilian M 03/17/2021, 1:37 PM

## 2021-03-18 ENCOUNTER — Inpatient Hospital Stay (HOSPITAL_COMMUNITY): Payer: Medicaid Other

## 2021-03-18 DIAGNOSIS — T80212A Local infection due to central venous catheter, initial encounter: Secondary | ICD-10-CM

## 2021-03-18 DIAGNOSIS — T80212D Local infection due to central venous catheter, subsequent encounter: Secondary | ICD-10-CM

## 2021-03-18 DIAGNOSIS — C762 Malignant neoplasm of abdomen: Secondary | ICD-10-CM

## 2021-03-18 DIAGNOSIS — B377 Candidal sepsis: Secondary | ICD-10-CM

## 2021-03-18 HISTORY — PX: IR REMOVAL TUN ACCESS W/ PORT W/O FL MOD SED: IMG2290

## 2021-03-18 LAB — COMPREHENSIVE METABOLIC PANEL
ALT: 236 U/L — ABNORMAL HIGH (ref 0–44)
AST: 190 U/L — ABNORMAL HIGH (ref 15–41)
Albumin: 2.8 g/dL — ABNORMAL LOW (ref 3.5–5.0)
Alkaline Phosphatase: 184 U/L — ABNORMAL HIGH (ref 38–126)
Anion gap: 8 (ref 5–15)
BUN: <5 mg/dL — ABNORMAL LOW (ref 6–20)
CO2: 22 mmol/L (ref 22–32)
Calcium: 8.5 mg/dL — ABNORMAL LOW (ref 8.9–10.3)
Chloride: 106 mmol/L (ref 98–111)
Creatinine, Ser: 0.51 mg/dL (ref 0.44–1.00)
GFR, Estimated: >60 mL/min (ref >60)
Glucose, Bld: 136 mg/dL — ABNORMAL HIGH (ref 70–99)
Potassium: 4 mmol/L (ref 3.5–5.1)
Sodium: 136 mmol/L (ref 135–145)
Total Bilirubin: 0.5 mg/dL (ref 0.3–1.2)
Total Protein: 6.4 g/dL — ABNORMAL LOW (ref 6.5–8.1)

## 2021-03-18 LAB — CBC WITH DIFFERENTIAL/PLATELET
Abs Immature Granulocytes: 0.02 10*3/uL (ref 0.00–0.07)
Basophils Absolute: 0 10*3/uL (ref 0.0–0.1)
Basophils Relative: 1 %
Eosinophils Absolute: 0.1 10*3/uL (ref 0.0–0.5)
Eosinophils Relative: 2 %
HCT: 28.3 % — ABNORMAL LOW (ref 36.0–46.0)
Hemoglobin: 8.8 g/dL — ABNORMAL LOW (ref 12.0–15.0)
Immature Granulocytes: 1 %
Lymphocytes Relative: 36 %
Lymphs Abs: 1.4 10*3/uL (ref 0.7–4.0)
MCH: 26.3 pg (ref 26.0–34.0)
MCHC: 31.1 g/dL (ref 30.0–36.0)
MCV: 84.5 fL (ref 80.0–100.0)
Monocytes Absolute: 0.5 10*3/uL (ref 0.1–1.0)
Monocytes Relative: 11 %
Neutro Abs: 2 10*3/uL (ref 1.7–7.7)
Neutrophils Relative %: 49 %
Platelets: 148 10*3/uL — ABNORMAL LOW (ref 150–400)
RBC: 3.35 MIL/uL — ABNORMAL LOW (ref 3.87–5.11)
RDW: 14.1 % (ref 11.5–15.5)
WBC: 4 10*3/uL (ref 4.0–10.5)
nRBC: 0 % (ref 0.0–0.2)

## 2021-03-18 LAB — CBC
HCT: 30.8 % — ABNORMAL LOW (ref 36.0–46.0)
Hemoglobin: 9.5 g/dL — ABNORMAL LOW (ref 12.0–15.0)
MCH: 26.5 pg (ref 26.0–34.0)
MCHC: 30.8 g/dL (ref 30.0–36.0)
MCV: 85.8 fL (ref 80.0–100.0)
Platelets: 165 10*3/uL (ref 150–400)
RBC: 3.59 MIL/uL — ABNORMAL LOW (ref 3.87–5.11)
RDW: 14.1 % (ref 11.5–15.5)
WBC: 5.1 10*3/uL (ref 4.0–10.5)
nRBC: 0 % (ref 0.0–0.2)

## 2021-03-18 LAB — GLUCOSE, CAPILLARY
Glucose-Capillary: 123 mg/dL — ABNORMAL HIGH (ref 70–99)
Glucose-Capillary: 157 mg/dL — ABNORMAL HIGH (ref 70–99)
Glucose-Capillary: 95 mg/dL (ref 70–99)
Glucose-Capillary: 108 mg/dL — ABNORMAL HIGH (ref 70–99)

## 2021-03-18 LAB — OCCULT BLOOD X 1 CARD TO LAB, STOOL: Fecal Occult Bld: POSITIVE — AB

## 2021-03-18 MED ORDER — SILVER NITRATE-POT NITRATE 75-25 % EX MISC
1.0000 | Freq: Once | CUTANEOUS | Status: AC
  Administered 2021-03-18: 1 via TOPICAL
  Filled 2021-03-18: qty 1

## 2021-03-18 MED ORDER — FLUCONAZOLE IN SODIUM CHLORIDE 400-0.9 MG/200ML-% IV SOLN
800.0000 mg | INTRAVENOUS | Status: DC
  Filled 2021-03-18: qty 400

## 2021-03-18 MED ORDER — MIDAZOLAM HCL 2 MG/2ML IJ SOLN
INTRAMUSCULAR | Status: AC
  Filled 2021-03-18: qty 4

## 2021-03-18 MED ORDER — FENTANYL CITRATE (PF) 100 MCG/2ML IJ SOLN
INTRAMUSCULAR | Status: AC
  Filled 2021-03-18: qty 2

## 2021-03-18 MED ORDER — LIDOCAINE-EPINEPHRINE 1 %-1:100000 IJ SOLN
INTRAMUSCULAR | Status: AC
  Filled 2021-03-18: qty 1

## 2021-03-18 MED ORDER — DIPHENHYDRAMINE HCL 12.5 MG/5ML PO ELIX
12.5000 mg | ORAL_SOLUTION | Freq: Four times a day (QID) | ORAL | Status: DC | PRN
Start: 2021-03-18 — End: 2021-03-21
  Administered 2021-03-18: 12.5 mg via ORAL
  Filled 2021-03-18: qty 5

## 2021-03-18 MED ORDER — SODIUM CHLORIDE 0.9 % IV SOLN
100.0000 mg | Freq: Once | INTRAVENOUS | Status: DC
  Filled 2021-03-18: qty 100

## 2021-03-18 MED ORDER — FLUCONAZOLE IN SODIUM CHLORIDE 400-0.9 MG/200ML-% IV SOLN: 400.0000 mg | INTRAVENOUS | Status: DC

## 2021-03-18 MED ORDER — MIDAZOLAM HCL 2 MG/2ML IJ SOLN
INTRAMUSCULAR | Status: AC | PRN
  Administered 2021-03-18 (×2): 1 mg via INTRAVENOUS

## 2021-03-18 MED ORDER — LIDOCAINE-EPINEPHRINE 1 %-1:100000 IJ SOLN
INTRAMUSCULAR | Status: AC | PRN
  Administered 2021-03-18: 10 mL

## 2021-03-18 MED ORDER — FLUCONAZOLE IN SODIUM CHLORIDE 400-0.9 MG/200ML-% IV SOLN
800.0000 mg | Freq: Once | INTRAVENOUS | Status: AC
  Administered 2021-03-19: 800 mg via INTRAVENOUS
  Filled 2021-03-18: qty 400

## 2021-03-18 MED ORDER — HYDROMORPHONE HCL 1 MG/ML PO LIQD
1.0000 mg | ORAL | Status: DC | PRN
  Administered 2021-03-18 – 2021-03-19 (×4): 1 mg via ORAL
  Filled 2021-03-18 (×5): qty 1

## 2021-03-18 NOTE — Progress Notes (Signed)
Chaplain engaged in a follow-up visit with Sarah Newman.  She shared that she had a hard day of thinking about all the health challenges she has faced.  She also shared that she was in a lot of pain, which was hard for her to handle.  Chaplain normalized her feelings.  Chaplain worked to [employer] know she has endured a lot and it is okay to need to express that.   Chaplain and Sarah Newman spent time talking about spirituality, mental health within minority communities, and family and friend dynamics and relationships.  Sarah Newman was able to freely share about what has been on her mind.  Chaplain was able to provide empathic and reflective listening, support, and prayer.  Chaplain will continue to follow-up.    03/18/21 1400  Clinical Encounter Type  Visited With Patient  Visit Type Follow-up;Spiritual support  Spiritual Encounters  Spiritual Needs Prayer;Emotional;Grief support  Stress Factors  Patient Stress Factors Health changes

## 2021-03-18 NOTE — Procedures (Signed)
Vascular and Interventional Radiology Procedure Note  Patient: Sarah Newman DOB: May 31, 1976 Medical Record Number: [id number] Note Date/Time: 03/18/21 12:06 PM   Performing Physician: Michaelle Birks, MD Assistant(s): None  Diagnosis: Ovarian cancer. Bacteremia.  Procedure: PORT REMOVAL  Anesthesia: Local Anesthetic Complications: None Estimated Blood Loss:  <3 mL Specimens:   Catheter tip culture  Findings:  Successful removal of a right-sided venous port. Primary incision closure. Dermabond at skin.  See detailed procedure note with images in PACS. The patient tolerated the procedure well without incident or complication and was returned to Floor Bed in stable condition.    Michaelle Birks, MD [facility] Radiology   Pager. [place]

## 2021-03-18 NOTE — Progress Notes (Addendum)
Referring Physician(s): [place]  Supervising Physician: Mugweru,J  Patient Status:  [facility] - In-pt  Chief Complaint: Bacteremia,fungemia; need for port a cath removal   Subjective: Patient familiar to IR service from Port-A-Cath placement on  08/12/2019.  She has a history of ovarian cancer diagnosed in 2020. She was having intermittent bowel dysfunction with no obvious evidence of malignancy on imaging in June and underwent diagnostic laparoscopy with exploratory laparotomy and attempt at lysis of adhesions.  This was complicated by enterotomy.  She was then admitted in early July with EC fistula vs subcutaneous wound infection.  She went to the OR 7/2 for I&D of phlegmon within the subcutaneous tissue but no definitive evidence of EC fistula at that time.  The wound was packed and within 24 hrs had output that was c/w succus, thus confirming an EC fistula.  OR cultures at that time grew Citrobacter freundii, Klebsiella pneumonia, and Bacteroides.  Treated with 7 days of abx post I&D.  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  She presented to the emergency department on 8/12 with fever, abdominal pain, nausea, vomiting.  Latest blood cultures growing Staph epidermidis, Candida parapsilosis.  She is currently afebrile, WBC 4, hemoglobin 8.8, platelets 148k.  Request now received for Port-A-Cath removal.  She currently denies chest pain, dyspnea, cough, vomiting or abnormal bleeding. She is nontender at port site. She is anxious.   Past Medical History:  Diagnosis Date   Anemia due to antineoplastic chemotherapy    Arthritis    Asthma due to seasonal allergies    01-21-2021 per pt seasonal and exercised induced, does not have inhaler, last used one few years   Chronic abdominal pain 01/2021   Chronic nausea    01-21-2021  per pt intermittant nausea and when has abd pain most of time will have vomiting   Family history of breast cancer    Family  history of colon cancer    Family history of ovarian cancer    Family history of prostate cancer    History of 2019 novel coronavirus disease (COVID-19) 09/08/2020   positive results in epic, per pt mild symptoms that resolved   History of Helicobacter pylori infection 07/2016   E[facility] w/ bx's by [facility],  chronic h.pylori w/ gastritis,  treated  (no ulcer)   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[place]) 07/2019   oncologist--- dr gorsuch/ dr Berline Lopes--- 07-21-2019 s/p exp. 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;  progression w/ abdominal carcinomatosis   On total parenteral nutrition (TPN) 02/02/2021   infused thru [place] in place    Steroid-induced diabetes ([place])    followed by dr Alvy Bimler   Past Surgical History:  Procedure Laterality Date   CHOLECYSTECTOMY N/A 02/16/2015   Procedure: LAPAROSCOPIC CHOLECYSTECTOMY WITH INTRAOPERATIVE CHOLANGIOGRAM;  Surgeon: Johnathan Hausen, MD;  Location: [facility];  Service: General;  Laterality: N/A;   INCISION AND DRAINAGE ABSCESS N/A 02/02/2021   Procedure: INCISION AND DRAINAGE ABSCESS;  Surgeon: Lahoma Crocker, MD;  Location: [facility];  Service: Gynecology;  Laterality: N/A;   IR IMAGING GUIDED PORT INSERTION  08/12/2019   LAPAROSCOPY N/A 01/23/2021   Procedure: LAPAROSCOPY DIAGNOSTIC;  Surgeon: Lafonda Mosses, MD;  Location: [facility];  Service: Gynecology;  Laterality: N/A;   LAPAROTOMY N/A 01/23/2021   Procedure: LAPAROTOMY, ENTEROTOMY REPAIR OF THE BOWEL;  Surgeon: Lafonda Mosses, MD;  Location: [facility];  Service:  Gynecology;  Laterality: N/A;   SALPINGOOPHORECTOMY N/A 07/21/2019   Procedure: EXPLORATORY LAPAROTOMY, TOTAL ABDOMINAL HYSTERECTOMY, BILATERAL SALPINGO OOPHORECTOMY, OMENTECTOMY;  Surgeon: Lafonda Mosses, MD;  Location: [facility];  Service: Gynecology;  Laterality: N/A;   UPPER GASTROINTESTINAL ENDOSCOPY  08/01/2016   by dr Corinda Gubler in [place] N/A 03/11/2021   Procedure: OPENING AND EXPLORATION OF ABDOMINAL EXCISION;  Surgeon: Lafonda Mosses, MD;  Location: [facility];  Service: Gynecology;  Laterality: N/A;     Allergies: Carboplatin  Medications: Prior to Admission medications   Medication Sig Start Date End Date Taking? Authorizing Provider  Accu-Chek Softclix Lancets lancets Use as instructed 02/10/21  Yes Lafonda Mosses, MD  acetaminophen (TYLENOL) 325 MG tablet Take 650 mg by mouth every 6 (six) hours as needed for mild pain, fever or headache.   Yes [provider]  amoxicillin-clavulanate (AUGMENTIN) 875-125 MG tablet Take 1 tablet by mouth 2 (two) times daily for 7 days. 7 day supply 03/15/21 03/22/21 Yes Dykstra, Ellwood Dense, MD  cephALEXin (KEFLEX) 500 MG capsule Take 1 capsule by mouth 4 times daily. 03/14/21  Yes Cross, Lenna Sciara D, NP  glucose blood test strip Use as instructed 02/10/21  Yes Lafonda Mosses, MD  HYDROmorphone HCl (DILAUDID) 1 MG/ML LIQD Take 1 mL (1 mg total) by mouth every 4 (four) hours as needed for severe pain or moderate pain. 02/09/21  Yes Lafonda Mosses, MD  insulin aspart (NOVOLOG) 100 UNIT/ML injection Inject 0-15 Units into the skin 4 (four) times daily. Correction coverage: Moderate (average weight, post-op) CBG < 70: Implement Hypoglycemia Standing Orders and refer to Hypoglycemia Standing Orders sidebar report CBG 70 - 120: 0 units CBG 121 - 150: 2 units CBG 151 - 200: 3 units CBG 201 - 250: 5 units CBG 251 - 300: 8 units CBG 301 - 350: 11 units CBG 351 - 400: 15 units 02/10/21  Yes Lafonda Mosses, MD  LORazepam (ATIVAN) 0.5 MG tablet Take 1 tablet by mouth every 8 hours as needed for anxiety/insomnia. Do not take and drive. 03/12/21  Yes Cross, Melissa D, NP  omeprazole (PRILOSEC) 40 MG capsule Take 1 capsule (40 mg total) by mouth daily. 02/09/21  Yes Lafonda Mosses, MD  ondansetron (ZOFRAN ODT) 4 MG disintegrating tablet Take 1 tablet  (4 mg total) by mouth every 4 (four) hours as needed for up to 20 doses for nausea or vomiting. 02/22/21  Yes Lafonda Mosses, MD  oxyCODONE (ROXICODONE) 5 MG immediate release tablet Take 1 tablet (5 mg total) by mouth every 4 (four) hours as needed for up to 5 days for severe pain. 03/15/21 03/20/21 Yes Kommor, [place], MD  promethazine (PHENERGAN) 12.5 MG suppository Place 1 suppository (12.5 mg total) rectally every 8 (eight) hours as needed for up to 30 doses for nausea or vomiting. 03/15/21  Yes Lucrezia Starch, MD  acetaminophen (TYLENOL) 650 MG suppository Place 1 suppository (650 mg total) rectally every 6 (six) hours as needed for moderate pain. Patient not taking: No sig reported 02/09/21   Lafonda Mosses, MD  lidocaine-prilocaine (EMLA) cream APPLY TO THE AFFECTED AREA AS DIRECTED TO ACCESS PORT Patient not taking: Reported on 03/15/2021 10/22/20 10/22/21  Heath Lark, MD     Vital Signs: BP 131/83 (BP Location: Right Leg)   Pulse 75   Temp 98.8 F (37.1 C) (Oral)   Resp 16   Ht '5\' 2"'$  (1.575 m)   Wt 193 lb (  87.5 kg)   LMP 07/05/2019   SpO2 100%   BMI 35.30 kg/m   Physical Exam awake, alert.  Chest with sl dim BS rt base, left clear. Clean, intact right chest wall Port-A-Cath ,site nontender; heart with regular rate and rhythm.  Abdomen soft, positive bowel sounds, green drainage in Eakin pouch, mildly tender epigastric region.  No lower extremity edema.  Imaging: CT ABDOMEN PELVIS W CONTRAST  Result Date: 03/15/2021 CLINICAL DATA:  Abdominal pain and fever. Persistent postprocedural fistula. New onset of fever and vomiting. Increased abdominal pain. History of enterocutaneous fistula. Wound debridement 03/11/2021 EXAM: CT ABDOMEN AND PELVIS WITH CONTRAST TECHNIQUE: Multidetector CT imaging of the abdomen and pelvis was performed using the standard protocol following bolus administration of intravenous contrast. CONTRAST:  74m OMNIPAQUE IOHEXOL 350 MG/ML SOLN COMPARISON:   Most recent CT 02/28/2021 FINDINGS: Lower chest: Trace right pleural effusion, increased from prior. No acute airspace disease. Central line tip in the SVC, partially included. Hepatobiliary: No focal hepatic lesion. There is no intrahepatic fluid collection. Thin sliver of fluid adjacent to the anterior right lobe of the liver, series 2, image 22 measures 0.9 x 2.3 x 1.6 cm, no definite peripheral enhancement. No internal air. Clips in the gallbladder fossa postcholecystectomy. Stable biliary prominence postcholecystectomy. Pancreas: No ductal dilatation or inflammation. Spleen: Normal in size without focal abnormality. Adrenals/Urinary Tract: Normal adrenal glands. No hydronephrosis or perinephric edema. Homogeneous renal enhancement with symmetric excretion on delayed phase imaging. Urinary bladder is physiologically distended without wall thickening. Stomach/Bowel: Mild perigastric stranding is similar to prior exam, series 2, image 28. Equivocal distal gastric wall thickening. No evidence of small-bowel obstruction. Small bowel loops and transverse colon again appose the anterior abdominal wall in the region of suspected fistula. There is slight increased contrast collection in the anterior abdominal wall, series 2, image 51, with contrast collection currently measuring approximately 2.7 x 1.4 cm, previously 1.3 cm. The enteric contrast on the current exam has progressed beyond the small bowel and is seen throughout the colon. There is no colonic wall thickening or colonic inflammation. Vascular/Lymphatic: Normal caliber abdominal aorta. Patent portal and splenic veins. No portal venous or mesenteric gas. Perigastric lymph node or nodularity measuring 10 mm, not significantly changed from prior exam, series 2, image 25. No other enlarged lymph nodes. Reproductive: Hysterectomy without adnexal mass. Minimal soft tissue thickening adjacent to the vaginal cuff is stable. Other: Increased fat stranding and air  contrast collection in the anterior abdominal wall at site of known fistula, with tract extending to the skin surface, currently 2.7 x 1.4 cm, previously 1.3 cm. There is no discrete drainable subcutaneous collection. No drainable abdominopelvic collection. There may be trace free fluid in the pelvis. Mild edema the small bowel mesentery is similar. There is no free air. Musculoskeletal: There are no acute or suspicious osseous abnormalities. IMPRESSION: 1. Increased fat stranding and increased size of air contrast collection in the anterior abdominal wall at site of known enterocutaneous fistula, with tract extending to the skin surface. This currently measures 2.7 x 1.4 cm, previously 1.3 cm. 2. No new subcutaneous collection or abscess. 3. Thin sliver of fluid adjacent to the anterior right lobe of the liver measures 0.9 x 2.3 x 1.6 cm, no definite peripheral enhancement or internal air. This likely represents postoperative fluid, of indeterminate sterility. 4. Equivocal distal gastric wall thickening. 5. Trace right pleural effusion. 6. Perigastric lymph node or nodularity measuring 10 mm in the gastrohepatic ligament, not significantly changed from recent  exam. Electronically Signed   By: Keith Rake M.D.   On: 03/15/2021 16:32   DG Chest Port 1 View  Result Date: 03/16/2021 CLINICAL DATA:  45 year old female with possible sepsis. Recent abdominal surgery. EXAM: PORTABLE CHEST 1 VIEW COMPARISON:  Portable chest 11/25/2019. FINDINGS: Portable AP semi upright view at 1054 hours. Stable low lung volumes. Stable right chest power port. Normal cardiac size and mediastinal contours. Visualized tracheal air column is within normal limits. Allowing for portable technique the lungs are clear. Paucity of bowel gas in the upper abdomen. No acute osseous abnormality identified. IMPRESSION: No acute cardiopulmonary abnormality. Electronically Signed   By: Genevie Ann M.D.   On: 03/16/2021 11:22   ECHOCARDIOGRAM  COMPLETE  Result Date: 03/17/2021    ECHOCARDIOGRAM REPORT   Patient Name:   Sarah Newman Date of Exam: 03/17/2021 Medical Rec #:  [id number]          Height:       62.0 in Accession #:    [id number]         Weight:       193.0 lb Date of Birth:  [birth year]          BSA:          1.883 m Patient Age:    9 years           BP:           121/83 mmHg Patient Gender: F                  HR:           84 bpm. Exam Location:  Inpatient Procedure: 2D Echo, Cardiac Doppler, Color Doppler and Strain Analysis Indications:    Bacteremia R78.81  History:        Patient has no prior history of Echocardiogram examinations.                 History of cancer.  Sonographer:    Darlina Sicilian RDCS Referring Phys: [id number] [place]  1. Left ventricular ejection fraction, by estimation, is 55 to 60%. The left ventricle has normal function. The left ventricle has no regional wall motion abnormalities. Left ventricular diastolic parameters were normal.  2. Right ventricular systolic function is low normal. The right ventricular size is normal. Tricuspid regurgitation signal is inadequate for assessing PA pressure.  3. The mitral valve is grossly normal. Trivial mitral valve regurgitation.  4. The aortic valve is tricuspid. Aortic valve regurgitation is not visualized.  5. The inferior vena cava is normal in size with greater than 50% respiratory variability, suggesting right atrial pressure of 3 mmHg. Conclusion(s)/Recommendation(s): No evidence of valvular vegetations on this transthoracic echocardiogram. Would recommend a transesophageal echocardiogram to exclude infective endocarditis if clinically indicated. FINDINGS  Left Ventricle: Left ventricular ejection fraction, by estimation, is 55 to 60%. The left ventricle has normal function. The left ventricle has no regional wall motion abnormalities. The left ventricular internal cavity size was normal in size. There is  no left ventricular hypertrophy. Left  ventricular diastolic parameters were normal. Right Ventricle: The right ventricular size is normal. No increase in right ventricular wall thickness. Right ventricular systolic function is low normal. Tricuspid regurgitation signal is inadequate for assessing PA pressure. Left Atrium: Left atrial size was normal in size. Right Atrium: Right atrial size was normal in size. Pericardium: There is no evidence of pericardial effusion. Mitral Valve: The mitral valve is grossly normal. Trivial mitral valve regurgitation.  Tricuspid Valve: The tricuspid valve is grossly normal. Tricuspid valve regurgitation is trivial. Aortic Valve: The aortic valve is tricuspid. Aortic valve regurgitation is not visualized. Pulmonic Valve: The pulmonic valve was normal in structure. Pulmonic valve regurgitation is not visualized. Aorta: The aortic root and ascending aorta are structurally normal, with no evidence of dilitation. Venous: The inferior vena cava is normal in size with greater than 50% respiratory variability, suggesting right atrial pressure of 3 mmHg. IAS/Shunts: No atrial level shunt detected by color flow Doppler.  LEFT VENTRICLE PLAX 2D LVIDd:         4.90 cm  Diastology LVIDs:         4.20 cm  LV e' medial:    8.74 cm/s LV PW:         0.60 cm  LV E/e' medial:  9.8 LV IVS:        0.60 cm  LV e' lateral:   14.10 cm/s LVOT diam:     1.60 cm  LV E/e' lateral: 6.1 LV SV:         28 LV SV Index:   15 LVOT Area:     2.01 cm  RIGHT VENTRICLE RV S prime:     10.80 cm/s TAPSE (M-mode): 2.9 cm LEFT ATRIUM             Index       RIGHT ATRIUM           Index LA diam:        3.00 cm 1.59 cm/m  RA Area:     10.90 cm LA Vol (A2C):   28.9 ml 15.35 ml/m RA Volume:   20.80 ml  11.05 ml/m LA Vol (A4C):   33.1 ml 17.58 ml/m LA Biplane Vol: 31.7 ml 16.84 ml/m  AORTIC VALVE LVOT Vmax:   82.90 cm/s LVOT Vmean:  56.700 cm/s LVOT VTI:    0.141 m  AORTA Ao Root diam: 2.90 cm Ao Asc diam:  2.70 cm MITRAL VALVE MV Area (PHT): 5.04 cm     SHUNTS MV Decel Time: 151 msec    Systemic VTI:  0.14 m MV E velocity: 85.80 cm/s  Systemic Diam: 1.60 cm MV A velocity: 70.37 cm/s MV E/A ratio:  1.22 Lyman Bishop MD Electronically signed by Lyman Bishop MD Signature Date/Time: 03/17/2021/3:07:36 PM    Final    US Abdomen Limited RUQ (LIVER/GB)  Result Date: 03/17/2021 CLINICAL DATA:  Abscess.  Abdominal pain.  Cholecystectomy. EXAM: ULTRASOUND ABDOMEN LIMITED RIGHT UPPER QUADRANT COMPARISON:  None. FINDINGS: Gallbladder: Surgically absent Common bile duct: Diameter: 6 mm Liver: No focal lesion identified. Within normal limits in parenchymal echogenicity. Portal vein is patent on color Doppler imaging with normal direction of blood flow towards the liver. Other: Small right pleural effusion. IMPRESSION: 1. Small right pleural effusion. 2. Previous cholecystectomy. 3. No other abnormalities. Electronically Signed   By: Dorise Bullion III M.D.   On: 03/17/2021 12:37    Labs:  CBC: Recent Labs    03/15/21 1137 03/16/21 1024 03/17/21 0425 03/18/21 0323  WBC 8.7 6.6 4.4 4.0  HGB 11.4* 9.8* 9.8* 8.8*  HCT 35.8* 30.5* 30.6* 28.3*  PLT 210 162 175 148*    COAGS: Recent Labs    02/02/21 1209 03/16/21 1042  INR 1.0 1.1  APTT 31 25    BMP: Recent Labs    04/05/20 1008 05/29/20 1129 03/15/21 1137 03/16/21 1024 03/17/21 0425 03/18/21 0323  NA 138   < > 136 135 130*  136  K 3.6   < > 3.9 4.3 3.4* 4.0  CL 102   < > 104 102 98 106  CO2 27   < > '22 23 23 22  '$ GLUCOSE 122*   < > 139* 241* 140* 136*  BUN 9   < > '12 15 6 '$ <5*  CALCIUM 10.1   < > 9.3 8.7* 8.4* 8.5*  CREATININE 0.68   < > 0.62 0.40* 0.62 0.51  GFRNONAA >60   < > >60 >60 >60 >60  GFRAA >60  --   --   --   --   --    < > = values in this interval not displayed.    LIVER FUNCTION TESTS: Recent Labs    03/15/21 1137 03/16/21 1024 03/17/21 0425 03/18/21 0323  BILITOT 0.4 0.4 0.7 0.5  AST 124* 232* 380* 190*  ALT 89* 181* 302* 236*  ALKPHOS 120 140* 206* 184*   PROT 7.9 7.5 7.3 6.4*  ALBUMIN 3.6 3.2* 3.2* 2.8*    Assessment and Plan: Patient familiar to IR service from Port-A-Cath placement on  08/12/2019.  She has a history of ovarian cancer diagnosed in 2020. She was having intermittent bowel dysfunction with no obvious evidence of malignancy on imaging in June and underwent diagnostic laparoscopy with exploratory laparotomy and attempt at lysis of adhesions.  This was complicated by enterotomy.  She was then admitted in early July with EC fistula vs subcutaneous wound infection.  She went to the OR 7/2 for I&D of phlegmon within the subcutaneous tissue but no definitive evidence of EC fistula at that time.  The wound was packed and within 24 hrs had output that was c/w succus, thus confirming an EC fistula.  OR cultures at that time grew Citrobacter freundii, Klebsiella pneumonia, and Bacteroides.  Treated with 7 days of abx post I&D.  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  She presented to the emergency department on 8/12 with fever, abdominal pain, nausea, vomiting.  Latest blood cultures growing Staph epidermidis, Candida parapsilosis.  She is currently afebrile, WBC 4, hemoglobin 8.8, platelets 148k.  Request now received for Port-A-Cath removal.  Details/risks of procedure, including but not limited to, internal bleeding, infection, injury to adjacent structures discussed with patient with her understanding and consent.  Procedure scheduled for today.   Electronically Signed: D. Rowe Robert, PA-C 03/18/2021, 10:31 AM   I spent a total of 20 minutes at the the patient's bedside AND on the patient's hospital floor or unit, greater than 50% of which was counseling/coordinating care for Port-A-Cath removal    Patient ID: Sarah Newman, female   DOB: 01-Feb-1976, 45 y.o.   MRN: [id number]

## 2021-03-18 NOTE — Consult Note (Signed)
WOC Nurse Consult Note: Reason for Consult: fistula Patient with GYN surgery and post chemotherapy; she has enterocutaneous fistula midline. She has been managing this at home with Eakin pouch Wound type: EC fistula Pressure Injury POA: NA Ostomy pouch intact from the weekend. Staff not aware how to order Eakin pouches. Demonstrated opening and closing lock and roll closure to patient for her independence until she changes back to Eakin pouch.  Dressing procedure/placement/frequency:  3 [place] #[id number]-Small Eakin pouches ordered  [place] nurses will remain available to patient and staff for support with EC fistula while inpatient.   [place], [place], [place]

## 2021-03-18 NOTE — TOC Initial Note (Signed)
Transition of Care [facility]) - Initial/Assessment Note    Patient Details  Name: Sarah Newman MRN: [id number] Date of Birth: 11/12/1975  Transition of [facility]) CM/SW Contact:    Lynnell Catalan, RN Phone Number: 03/18/2021, 1:51 PM  Clinical Narrative:                 Pt was active with [facility] for [place] for home TPN prior to admission. Pam from [place] following along during hospital stay. TOC will continue to follow.     Activities of Daily Living Home Assistive Devices/Equipment: None ADL Screening (condition at time of admission) Patient's cognitive ability adequate to safely complete daily activities?: Yes Is the patient deaf or have difficulty hearing?: No Does the patient have difficulty seeing, even when wearing glasses/contacts?: No Does the patient have difficulty concentrating, remembering, or making decisions?: No Patient able to express need for assistance with ADLs?: Yes Does the patient have difficulty dressing or bathing?: No Independently performs ADLs?: Yes (appropriate for developmental age) Does the patient have difficulty walking or climbing stairs?: No Weakness of Legs: None Weakness of Arms/Hands: None           Admission diagnosis:  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] 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HCC) [C56.2] 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] Sepsis ([place]) [A41.9] Sepsis due to Candida species with acute liver failure without hepatic coma or septic shock ([place]) [B37.7, R65.20, K72.00] Patient Active Problem List   Diagnosis Date Noted   Sepsis ([place]) 03/16/2021   On total parenteral nutrition (TPN) 03/16/2021   Candidemia ([place]) 03/16/2021   Staphylococcus epidermidis bacteremia 03/16/2021   Post-operative pain    Fever postop 03/14/2021   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   Postoperative abscess 02/02/2021   Abscess    Intermittent small bowel obstruction due to adhesions ([place]) 01/23/2021   Intra-abdominal  adhesions    Nausea without vomiting 01/11/2021   SBO (small bowel obstruction) ([place]) 01/11/2021   Poor dentition 12/10/2020   Pancytopenia, acquired ([place]) 12/10/2020   Allergic reaction 10/15/2020   Physical debility 10/01/2020   Steroid-induced diabetes ([place]) 09/20/2020   Constipation 09/20/2020   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   Cancer associated pain 09/14/2020   Lab test positive for detection of COVID-19 virus 09/10/2020   Hypokalemia 08/30/2020   Obesity, Class II, BMI 35-39.9 07/17/2020   Anemia, chronic disease 05/29/2020   UTI (urinary tract infection) 01/09/2020   Dysuria 01/06/2020   Anemia due to antineoplastic chemotherapy 12/05/2019   Gastritis 12/05/2019   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/2019   Weight gain 10/25/2019   Vaginal dryness 10/24/2019   Drug-induced hyperglycemia 10/03/2019   Genetic testing 09/23/2019   Bone pain 09/13/2019   Restless leg 09/13/2019   Elevated liver enzymes 09/13/2019   Family history of ovarian cancer    Family history of prostate cancer    Family history of colon cancer    Family history of breast cancer    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[place])    Pelvic mass in female 07/11/2019   Obesity 07/11/2019   Cervical high risk HPV (human papillomavirus) test positive 02/17/2017   Cholecystitis 02/16/2015   PCP:  Heath Lark, MD Pharmacy:   [facility] [address] [place] [address] Phone: [phone] Fax: [phone]     Social Determinants of Health (SDOH) Interventions    Readmission Risk Interventions Readmission Risk Prevention Plan 02/06/2021  Transportation Screening Complete  PCP or Specialist Appt within 5-7 Days Complete  Home  Care Screening Complete  Medication Review (RN CM) Complete  Some recent data might be hidden

## 2021-03-18 NOTE — Progress Notes (Signed)
Gynecologic Oncology Progress Note  Subjective: She reports abdominal pain related to gas moving through the fistula and related to the current ostomy bag being used over the site. She states she took the ordered amitriptyline for sleep and did not notice a difference with only getting around 2 hours of sleep. Denies nausea or emesis this am. Reports her urine as being dark in color like beer. She reports a fine, non erythematous rash on her hands that itches intermittently. States she actually felt hungry this am. She states the current ostomy bag was placed late Saturday evening into Sunday am and has not been emptied since that time. She is concerned about experiencing significant pain with the port removal and states she felt everything when it was inserted. Patient reassured by Dr. Berline Lopes. All questions answered.   Objective: Vital signs in last 24 hours: Temp:  [98.6 F (37 C)-100.5 F (38.1 C)] 98.8 F (37.1 C) (08/15 0605) Pulse Rate:  [69-84] 75 (08/15 0605) Resp:  [16-20] 16 (08/15 0605) BP: (121-131)/(83-93) 131/83 (08/15 0605) SpO2:  [100 %] 100 % (08/15 0605) Last BM Date: 03/16/21  Intake/Output from previous day: 08/14 0701 - 08/15 0700 In: 3992.6 [P.O.:20; I.V.:3072.6; IV Piggyback:900.1] Out: 0   Physical Examination: General: alert, cooperative, no distress, and appears slightly anxious when talking about port removal Resp: clear to auscultation bilaterally Cardio: regular rate and rhythm, S1, S2 normal, no murmur, click, rub or gallop GI: soft, non-tender; bowel sounds normal; no masses,  no organomegaly and 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 She was admitted with ID consultation (recommendation for repeat cultures) and placed on Eraxis, Vancomycin, Cefepime, and Flagyl.    Pain:  Pain is well-controlled on PRN medications. Dilaudid oral elixir ordered.  Heme: Hgb 8.8 and Hct 28.3. Continue to monitor.  ID: Sepsis- on Eraxis, Vancomycin, Cefepime, and Flagyl and now changed to Vancomycin and dilflucan. Appreciate ID consultation and recommendations.  CV: BP and HR stable. Continue to monitor. Echo performed 03/17/21.   GI:  Tolerating po: NPO. Antiemetics ordered as needed. CT AP on 03/15/2021 with contrast seen throughout the colon. Plan for full liquid diet after port removal. Elevated liver enzymes have decreased compared with 8/14 values.  GU: Voiding adequate amounts per patient. Creatinine 0.51 this am. UA without signs of infection from 03/17/21.    FEN: No critical values noted. K+ at 4.0 from 3.4 on 03/17/21. Na+ at 136 from 130 on 03/17/21.  Endo: Diabetes mellitus Type II, under good control.  CBG: CBG (last 3)  Recent Labs    03/17/21 1735 03/17/21 2327 03/18/21  0603  GLUCAP 115* 119* 123*     Prophylaxis: Lovenox on hold for port removal today, SCDs at the bedside but not on the pt.  Plan: For port removal today (lovenox on hold for this with plans for resuming on 8/16 in am  . Cath tip to be sent for culture. When cleared to resume diet per IR, will begin full liquid  diet as tolerated and monitor fistula output in response to increased oral intake. Strict I&O with eakin pouch emptying every shift. RN notified to change ostomy bag to eakin pouch. Continue IV antibiotics per ID. Appreciate ID recs. Sleep hygiene discussed with patient. Continue plan of care per Dr. Berline Lopes, ID.   LOS: 2 days    Sarah Newman 03/18/2021, 10:52 AM

## 2021-03-18 NOTE — Progress Notes (Signed)
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 -- last chemo 09/2020; exlap 01/2021 path no cancer; followed by gyn onc  PLAN: Transition anidulafungin to fluconazole If staph epi is susceptibile to linezolid could transition vancomycin to that Duration 7 days of staph epi treatment from 8/15, and 2 weeks candida tx from same date Need outpatient ophthalology exam to r/o intraocular candida infection Will arrange id f/u in 3-4 weeks  I spent more than 35 minute reviewing data/chart, and coordinating care and >50% direct face to face time providing counseling/discussing diagnostics/treatment plan with patient   Principal Problem:   Candidemia ([place]) Active Problems:   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HCC)    Elevated liver enzymes   Enterocutaneous fistula   Sepsis ([place])   On total parenteral nutrition (TPN)   Staphylococcus epidermidis bacteremia   Allergies  Allergen Reactions   Carboplatin Shortness Of Breath    Scheduled Meds:  alteplase  2 mg Intracatheter Once   amitriptyline  25 mg Oral QHS   lidocaine-EPINEPHrine       midazolam       pantoprazole (PROTONIX) IV  40 mg Intravenous Q24H   Continuous Infusions:  dextrose 5 % and 0.9 % NaCl with KCl 20 mEq/L 50 mL/hr at 03/18/21 1306   [START ON 03/20/2021] fluconazole (DIFLUCAN) IV     [START ON 03/19/2021] fluconazole (DIFLUCAN) IV     promethazine (PHENERGAN) injection (IM or IVPB)     vancomycin 1,000 mg (03/18/21 1027)   PRN Meds:.acetaminophen, diphenhydrAMINE, HYDROmorphone (DILAUDID) injection, HYDROmorphone HCl, LORazepam, ondansetron, promethazine (PHENERGAN) injection (IM or IVPB), Zinc Oxide   SUBJECTIVE: Feels well No f/c No n/v/diarrhea No abd pain  Pending port removal  Review of Systems: ROS All other ROS was negative, except mentioned above     OBJECTIVE: Vitals:   03/18/21 1150 03/18/21 1155 03/18/21 1233 03/18/21 1445  BP: (!) 148/77 (!) 155/81 (!) 145/69 (!) 156/90  Pulse: 80 89 74 68  Resp: '20 20 18 18  '$ Temp:    97.8 F (36.6 C)  TempSrc:    Oral  SpO2: 100% 100% 99% 99%  Weight:      Height:       Body mass index is 35.3 kg/m.  Physical Exam  General/constitutional: no distress, pleasant HEENT: Normocephalic, PER, Conj Clear, EOMI, Oropharynx clear  Neck supple CV: rrr no mrg Lungs: clear to auscultation, normal respiratory effort Abd: Soft, Nontender - fistula bag functioning Ext: no edema Skin: No Rash Neuro: nonfocal MSK: no peripheral joint swelling/tenderness/warmth; back spines nontender   Central line presence: yes right chest port site no erythema/tenderness   Lab Results Lab Results  Component Value Date   WBC 4.0 03/18/2021   HGB 8.8 (L) 03/18/2021   HCT  28.3 (L) 03/18/2021   MCV 84.5 03/18/2021   PLT 148 (L) 03/18/2021    Lab Results  Component Value Date   CREATININE 0.51 03/18/2021   BUN <5 (L) 03/18/2021   NA 136 03/18/2021   K 4.0 03/18/2021   CL 106 03/18/2021   CO2 22 03/18/2021    Lab Results  Component Value Date   ALT 236 (H) 03/18/2021   AST 190 (H) 03/18/2021   ALKPHOS 184 (H) 03/18/2021   BILITOT 0.5 03/18/2021      Microbiology: Recent Results (from the past 240 hour(s))  Blood culture (routine x 2)     Status: Abnormal (Preliminary result)   Collection Time: 03/15/21 12:20 PM   Specimen: BLOOD  Result Value Ref Range Status   Specimen Description   Final    BLOOD LEFT ANTECUBITAL Performed at [facility], [place] [address], [place], [place] [address]    Special Requests   Final    BOTTLES DRAWN AEROBIC AND ANAEROBIC Blood Culture adequate volume Performed at [place] [address] [place], [place] [address]    Culture  Setup Time   Final    GRAM POSITIVE COCCI YEAST IN BOTH AEROBIC AND ANAEROBIC BOTTLES CRITICAL RESULT CALLED TO, READ BACK BY AND VERIFIED WITH: LISA ATKINS RN '@0534'$  03/16/21 EB Performed at [facility], [place] [address] [place], [place] [address]    Culture STAPHYLOCOCCUS EPIDERMIDIS CANDIDA PARAPSILOSIS  (A)  Final   Report Status PENDING  Incomplete   Organism ID, Bacteria STAPHYLOCOCCUS EPIDERMIDIS  Final      Susceptibility   Staphylococcus epidermidis - MIC*    CIPROFLOXACIN <=0.5 SENSITIVE Sensitive     ERYTHROMYCIN <=0.25 SENSITIVE Sensitive     GENTAMICIN <=0.5 SENSITIVE Sensitive     OXACILLIN >=4 RESISTANT Resistant     TETRACYCLINE <=1 SENSITIVE Sensitive     VANCOMYCIN <=0.5 SENSITIVE Sensitive     TRIMETH/SULFA <=10 SENSITIVE Sensitive     CLINDAMYCIN <=0.25 SENSITIVE Sensitive     RIFAMPIN <=0.5 SENSITIVE Sensitive     Inducible Clindamycin NEGATIVE Sensitive     * STAPHYLOCOCCUS EPIDERMIDIS  Blood Culture ID  Panel (Reflexed)     Status: Abnormal   Collection Time: 03/15/21 12:20 PM  Result Value Ref Range Status   Enterococcus faecalis NOT DETECTED NOT DETECTED Final   Enterococcus Faecium NOT DETECTED NOT DETECTED Final   Listeria monocytogenes NOT DETECTED NOT DETECTED Final   Staphylococcus species DETECTED (A) NOT DETECTED Final    Comment: CRITICAL RESULT CALLED TO, READ BACK BY AND VERIFIED WITH: LISA ATKINS RN '@0534'$  03/16/21 EB    Staphylococcus aureus (BCID) NOT DETECTED NOT DETECTED Final   Staphylococcus epidermidis DETECTED (A) NOT DETECTED Final    Comment: Methicillin (oxacillin) resistant coagulase negative staphylococcus. Possible blood culture contaminant (unless isolated from more than one blood culture draw or clinical case suggests pathogenicity). No antibiotic treatment is indicated for blood  culture contaminants. CRITICAL RESULT CALLED TO, READ BACK BY AND VERIFIED WITH: LISA ATKINS RN '@0534'$  03/16/21 EB    Staphylococcus  lugdunensis NOT DETECTED NOT DETECTED Final   Streptococcus species NOT DETECTED NOT DETECTED Final   Streptococcus agalactiae NOT DETECTED NOT DETECTED Final   Streptococcus pneumoniae NOT DETECTED NOT DETECTED Final   Streptococcus pyogenes NOT DETECTED NOT DETECTED Final   A.calcoaceticus-baumannii NOT DETECTED NOT DETECTED Final   Bacteroides fragilis NOT DETECTED NOT DETECTED Final   Enterobacterales NOT DETECTED NOT DETECTED Final   Enterobacter cloacae complex NOT DETECTED NOT DETECTED Final   Escherichia coli NOT DETECTED NOT DETECTED Final   Klebsiella aerogenes NOT DETECTED NOT DETECTED Final   Klebsiella oxytoca NOT DETECTED NOT DETECTED Final   Klebsiella pneumoniae NOT DETECTED NOT DETECTED Final   Proteus species NOT DETECTED NOT DETECTED Final   Salmonella species NOT DETECTED NOT DETECTED Final   Serratia marcescens NOT DETECTED NOT DETECTED Final   Haemophilus influenzae NOT DETECTED NOT DETECTED Final   Neisseria meningitidis NOT  DETECTED NOT DETECTED Final   Pseudomonas aeruginosa NOT DETECTED NOT DETECTED Final   Stenotrophomonas maltophilia NOT DETECTED NOT DETECTED Final   Candida albicans NOT DETECTED NOT DETECTED Final   Candida auris NOT DETECTED NOT DETECTED Final   Candida glabrata NOT DETECTED NOT DETECTED Final   Candida krusei NOT DETECTED NOT DETECTED Final   Candida parapsilosis DETECTED (A) NOT DETECTED Final    Comment: CRITICAL RESULT CALLED TO, READ BACK BY AND VERIFIED WITH: LISA ATKINS RN '@0534'$  03/16/21 EB    Candida tropicalis NOT DETECTED NOT DETECTED Final   Cryptococcus neoformans/gattii NOT DETECTED NOT DETECTED Final   Methicillin resistance mecA/C DETECTED (A) NOT DETECTED Final    Comment: CRITICAL RESULT CALLED TO, READ BACK BY AND VERIFIED WITH: LISA ATKINS RN '@0534'$  03/16/21 EB Performed at [facility], [address], [place], [place] [address]   Blood culture (routine x 2)     Status: Abnormal (Preliminary result)   Collection Time: 03/15/21 12:33 PM   Specimen: BLOOD  Result Value Ref Range Status   Specimen Description   Final    BLOOD RIGHT ANTECUBITAL Performed at [place] [address] [place], [place] [address]    Special Requests   Final    BOTTLES DRAWN AEROBIC AND ANAEROBIC Blood Culture adequate volume Performed at [place] [address] [place], [place] [address]    Culture  Setup Time   Final    BUDDING YEAST SEEN AEROBIC BOTTLE ONLY CRITICAL VALUE NOTED.  VALUE IS CONSISTENT WITH PREVIOUSLY REPORTED AND CALLED VALUE. Performed at [facility], [place] [address], [place], [place] [address]    Culture CANDIDA PARAPSILOSIS (A)  Final   Report Status PENDING  Incomplete  Resp Panel by RT-PCR (Flu A&B, Covid) Nasopharyngeal Swab     Status: None   Collection Time: 03/15/21 12:34 PM   Specimen: Nasopharyngeal Swab; Nasopharyngeal(NP) swabs in vial transport medium  Result Value Ref Range Status   SARS  Coronavirus 2 by RT PCR NEGATIVE NEGATIVE Final    Comment: (NOTE) SARS-CoV-2 target nucleic acids are NOT DETECTED.  The SARS-CoV-2 RNA is generally detectable in upper respiratory specimens during the acute phase of infection. The lowest concentration of SARS-CoV-2 viral copies this assay can detect is 138 copies/mL. A negative result does not preclude SARS-Cov-2 infection and should not be used as the sole basis for treatment or other patient management decisions. A negative result may occur with  improper specimen collection/handling, submission of specimen other than nasopharyngeal swab, presence of viral mutation(s) within the areas targeted  by this assay, and inadequate number of viral copies(<138 copies/mL). A negative result must be combined with clinical observations, patient history, and epidemiological information. The expected result is Negative.  Fact Sheet for Patients:  EntrepreneurPulse.com.au  Fact Sheet for Healthcare Providers:  IncredibleEmployment.be  This test is no t yet approved or cleared by the [place] FDA and  has been authorized for detection and/or diagnosis of SARS-CoV-2 by FDA under an Emergency Use Authorization (EUA). This EUA will remain  in effect (meaning this test can be used) for the duration of the COVID-19 declaration under Section 564(b)(1) of the Act, 21 U.S.C.section 360bbb-3(b)(1), unless the authorization is terminated  or revoked sooner.       Influenza A by PCR NEGATIVE NEGATIVE Final   Influenza B by PCR NEGATIVE NEGATIVE Final    Comment: (NOTE) The Xpert Xpress SARS-CoV-2/FLU/RSV plus assay is intended as an aid in the diagnosis of influenza from Nasopharyngeal swab specimens and should not be used as a sole basis for treatment. Nasal washings and aspirates are unacceptable for Xpert Xpress SARS-CoV-2/FLU/RSV testing.  Fact Sheet for  Patients: EntrepreneurPulse.com.au  Fact Sheet for Healthcare Providers: IncredibleEmployment.be  This test is not yet approved or cleared by the [place] FDA and has been authorized for detection and/or diagnosis of SARS-CoV-2 by FDA under an Emergency Use Authorization (EUA). This EUA will remain in effect (meaning this test can be used) for the duration of the COVID-19 declaration under Section 564(b)(1) of the Act, 21 U.S.C. section 360bbb-3(b)(1), unless the authorization is terminated or revoked.  Performed at [facility], [place] [address], [place], [place] [address]   Culture, blood (single)     Status: Abnormal (Preliminary result)   Collection Time: 03/15/21  5:38 PM   Specimen: BLOOD  Result Value Ref Range Status   Specimen Description   Final    BLOOD PORTA CATH Performed at [place] [address] [place], [place] [address]    Special Requests   Final    BOTTLES DRAWN AEROBIC AND ANAEROBIC Blood Culture adequate volume Performed at [facility] [address] [place], [place] [address]    Culture  Setup Time   Final    BUDDING YEAST SEEN AEROBIC BOTTLE ONLY CRITICAL RESULT CALLED TO, READ BACK BY AND VERIFIED WITH: PHARMD E JACKSON AT 2350 03/16/2021 BY L BENFIELD Performed at [facility], [place] [address], [place], [place] [address]    Culture CANDIDA PARAPSILOSIS (A)  Final   Report Status PENDING  Incomplete  Blood Culture (routine x 2)     Status: Abnormal (Preliminary result)   Collection Time: 03/16/21 10:35 AM   Specimen: BLOOD  Result Value Ref Range Status   Specimen Description   Final    BLOOD RCHEST Performed at [facility] [address] [place], [place] [address]    Special Requests   Final    BOTTLES DRAWN AEROBIC AND ANAEROBIC Blood Culture results may not be optimal due to an inadequate volume of blood received in  culture bottles Performed at [facility] [address] [place], [place] [address]    Culture  Setup Time   Final    GRAM POSITIVE COCCI IN CLUSTERS BUDDING YEAST SEEN IN BOTH AEROBIC AND ANAEROBIC BOTTLES CRITICAL RESULT CALLED TO, READ BACK BY AND VERIFIED WITH: PHARMD E JACKSON AT 2146 03/16/2021 BY L BENFIELD Performed at [facility], [place] [address], [place], [place]  [address]    Culture (A)  Final    STAPHYLOCOCCUS EPIDERMIDIS SUSCEPTIBILITIES PERFORMED ON PREVIOUS CULTURE WITHIN THE LAST 5 DAYS. CANDIDA PARAPSILOSIS    Report Status PENDING  Incomplete  Resp Panel by RT-PCR (Flu A&B, Covid) Nasopharyngeal Swab     Status: None   Collection Time: 03/16/21 10:39 AM   Specimen: Nasopharyngeal Swab; Nasopharyngeal(NP) swabs in vial transport medium  Result Value Ref Range Status   SARS Coronavirus 2 by RT PCR NEGATIVE NEGATIVE Final    Comment: (NOTE) SARS-CoV-2 target nucleic acids are NOT DETECTED.  The SARS-CoV-2 RNA is generally detectable in upper respiratory specimens during the acute phase of infection. The lowest concentration of SARS-CoV-2 viral copies this assay can detect is 138 copies/mL. A negative result does not preclude SARS-Cov-2 infection and should not be used as the sole basis for treatment or other patient management decisions. A negative result may occur with  improper specimen collection/handling, submission of specimen other than nasopharyngeal swab, presence of viral mutation(s) within the areas targeted by this assay, and inadequate number of viral copies(<138 copies/mL). A negative result must be combined with clinical observations, patient history, and epidemiological information. The expected result is Negative.  Fact Sheet for Patients:  EntrepreneurPulse.com.au  Fact Sheet for Healthcare Providers:  IncredibleEmployment.be  This test is no t yet approved or cleared by the [place] FDA and  has been authorized for detection and/or diagnosis of SARS-CoV-2 by FDA under an Emergency Use Authorization (EUA). This EUA will remain  in effect (meaning this test can be used) for the duration of the COVID-19 declaration under Section 564(b)(1) of the Act, 21 U.S.C.section 360bbb-3(b)(1), unless the authorization is terminated  or revoked sooner.       Influenza A by PCR NEGATIVE NEGATIVE Final   Influenza B by PCR NEGATIVE NEGATIVE Final    Comment: (NOTE) The Xpert Xpress SARS-CoV-2/FLU/RSV plus assay is intended as an aid in the diagnosis of influenza from Nasopharyngeal swab specimens and should not be used as a sole basis for treatment. Nasal washings and aspirates are unacceptable for Xpert Xpress SARS-CoV-2/FLU/RSV testing.  Fact Sheet for Patients: EntrepreneurPulse.com.au  Fact Sheet for Healthcare Providers: IncredibleEmployment.be  This test is not yet approved or cleared by the [place] FDA and has been authorized for detection and/or diagnosis of SARS-CoV-2 by FDA under an Emergency Use Authorization (EUA). This EUA will remain in effect (meaning this test can be used) for the duration of the COVID-19 declaration under Section 564(b)(1) of the Act, 21 U.S.C. section 360bbb-3(b)(1), unless the authorization is terminated or revoked.  Performed at [facility], [place] [address], [place], [place] [address]   Culture, blood (routine x 2)     Status: None (Preliminary result)   Collection Time: 03/17/21  8:46 AM   Specimen: BLOOD  Result Value Ref Range Status   Specimen Description   Final    BLOOD RIGHT ANTECUBITAL Performed at [place] [address] [place], [place] [address]    Special Requests   Final    BOTTLES DRAWN AEROBIC ONLY Blood Culture adequate volume Performed at [facility] [address] [place], [place] [address]     Culture   Final    NO GROWTH < 24 HOURS Performed at [facility] [address] [place], [place] [address]    Report Status PENDING  Incomplete  Culture, blood (routine x 2)     Status: None (Preliminary result)  Collection Time: 03/17/21  8:46 AM   Specimen: BLOOD  Result Value Ref Range Status   Specimen Description   Final    BLOOD BLOOD RIGHT FOREARM Performed at [facility] [address] [place], [place] [address]    Special Requests   Final    BOTTLES DRAWN AEROBIC ONLY Blood Culture adequate volume Performed at [facility] [address] [place], [place] [address]    Culture   Final    NO GROWTH < 24 HOURS Performed at [facility] [address], [place], [place] [address]    Report Status PENDING  Incomplete     Serology:   Imaging: If present, new imagings (plain films, ct scans, and mri) have been personally visualized and interpreted; radiology reports have been reviewed. Decision making incorporated into the Impression / Recommendations.  8/14 tte  1. Left ventricular ejection fraction, by estimation, is 55 to 60%. The  left ventricle has normal function. The left ventricle has no regional  wall motion abnormalities. Left ventricular diastolic parameters were  normal.   2. Right ventricular systolic function is low normal. The right  ventricular size is normal. Tricuspid regurgitation signal is inadequate  for assessing PA pressure.   3. The mitral valve is grossly normal. Trivial mitral valve  regurgitation.   4. The aortic valve is tricuspid. Aortic valve regurgitation is not  visualized.   5. The inferior vena cava is normal in size with greater than 50%  respiratory variability, suggesting right atrial pressure of 3 mmHg.   Conclusion(s)/Recommendation(s): No evidence of valvular vegetations on  this transthoracic echocardiogram. Would recommend a transesophageal  echocardiogram to exclude  infective endocarditis if clinically indicated.     8/12 abd pelv ct with contrast 1. Increased fat stranding and increased size of air contrast collection in the anterior abdominal wall at site of known enterocutaneous fistula, with tract extending to the skin surface. This currently measures 2.7 x 1.4 cm, previously 1.3 cm. 2. No new subcutaneous collection or abscess. 3. Thin sliver of fluid adjacent to the anterior right lobe of the liver measures 0.9 x 2.3 x 1.6 cm, no definite peripheral enhancement or internal air. This likely represents postoperative fluid, of indeterminate sterility. 4. Equivocal distal gastric wall thickening. 5. Trace right pleural effusion. 6. Perigastric lymph node or nodularity measuring 10 mm in the gastrohepatic ligament, not significantly changed from recent exam.      Jabier Mutton, [place] for [place] [phone] pager    03/18/2021, 3:37 PM

## 2021-03-18 NOTE — Progress Notes (Signed)
GYN Oncology Progress Note  To the room with Dr. Berline Lopes to assess dark red drainage from ostomy bag that the patient reported after returning from port removal. The patient states she had a few sips of ginger ale and tolerated that well. No increased pain reported from the abdominal wound.   Patient alert, oriented, in no acute distress. Ostomy bag had been emptied and had dark red liquid coating the surface of the bag. The bag was removed without difficulty. 2-3 inches in the abdominal opening at the fistula site, there was sloughing noted with a clot present. The area was assessed and cleansed by Dr. Berline Lopes. Silver nitrate was applied to the area. An eakin pouch was applied.  Hemoccult is processing from earlier this afternoon but will most likely be positive given bleeding area noted in the wound bed. Plan for repeat CBC now. It can be expected to see continued dark red drainage with the goal for a decrease in the amount. If the bleeding has decreased significantly, the patient can start sipping on liquids later this evening. IVF increased to 75 cc/hr.  No concerns voiced at the end of the visit. Patient is resting, in no acute distress in the bed. No needs voiced.

## 2021-03-19 LAB — GLUCOSE, CAPILLARY
Glucose-Capillary: 108 mg/dL — ABNORMAL HIGH (ref 70–99)
Glucose-Capillary: 135 mg/dL — ABNORMAL HIGH (ref 70–99)
Glucose-Capillary: 127 mg/dL — ABNORMAL HIGH (ref 70–99)

## 2021-03-19 LAB — CBC WITH DIFFERENTIAL/PLATELET
Abs Immature Granulocytes: 0.02 10*3/uL (ref 0.00–0.07)
Basophils Absolute: 0 10*3/uL (ref 0.0–0.1)
Basophils Relative: 0 %
Eosinophils Absolute: 0.2 10*3/uL (ref 0.0–0.5)
Eosinophils Relative: 3 %
HCT: 29 % — ABNORMAL LOW (ref 36.0–46.0)
Hemoglobin: 9.1 g/dL — ABNORMAL LOW (ref 12.0–15.0)
Immature Granulocytes: 0 %
Lymphocytes Relative: 42 %
Lymphs Abs: 2.6 10*3/uL (ref 0.7–4.0)
MCH: 26.6 pg (ref 26.0–34.0)
MCHC: 31.4 g/dL (ref 30.0–36.0)
MCV: 84.8 fL (ref 80.0–100.0)
Monocytes Absolute: 0.5 10*3/uL (ref 0.1–1.0)
Monocytes Relative: 8 %
Neutro Abs: 2.8 10*3/uL (ref 1.7–7.7)
Neutrophils Relative %: 47 %
Platelets: 176 10*3/uL (ref 150–400)
RBC: 3.42 MIL/uL — ABNORMAL LOW (ref 3.87–5.11)
RDW: 14 % (ref 11.5–15.5)
WBC Morphology: ABNORMAL
WBC: 6.1 10*3/uL (ref 4.0–10.5)
nRBC: 0 % (ref 0.0–0.2)

## 2021-03-19 LAB — COMPREHENSIVE METABOLIC PANEL
ALT: 176 U/L — ABNORMAL HIGH (ref 0–44)
AST: 85 U/L — ABNORMAL HIGH (ref 15–41)
Albumin: 3 g/dL — ABNORMAL LOW (ref 3.5–5.0)
Alkaline Phosphatase: 181 U/L — ABNORMAL HIGH (ref 38–126)
Anion gap: 6 (ref 5–15)
BUN: <5 mg/dL — ABNORMAL LOW (ref 6–20)
CO2: 27 mmol/L (ref 22–32)
Calcium: 8.9 mg/dL (ref 8.9–10.3)
Chloride: 107 mmol/L (ref 98–111)
Creatinine, Ser: 0.55 mg/dL (ref 0.44–1.00)
GFR, Estimated: >60 mL/min (ref >60)
Glucose, Bld: 104 mg/dL — ABNORMAL HIGH (ref 70–99)
Potassium: 4 mmol/L (ref 3.5–5.1)
Sodium: 140 mmol/L (ref 135–145)
Total Bilirubin: 0.4 mg/dL (ref 0.3–1.2)
Total Protein: 6.8 g/dL (ref 6.5–8.1)

## 2021-03-19 LAB — VANCOMYCIN, TROUGH: Vancomycin Tr: 57 ug/mL (ref 15–20)

## 2021-03-19 MED ORDER — PROSOURCE PLUS PO LIQD
30.0000 mL | Freq: Two times a day (BID) | ORAL | Status: DC
  Administered 2021-03-21: 30 mL via ORAL
  Filled 2021-03-19 (×2): qty 30

## 2021-03-19 MED ORDER — LINEZOLID 600 MG PO TABS
600.0000 mg | ORAL_TABLET | Freq: Two times a day (BID) | ORAL | Status: DC
  Administered 2021-03-20 – 2021-03-21 (×3): 600 mg via ORAL
  Filled 2021-03-19 (×3): qty 1

## 2021-03-19 MED ORDER — RESOURCE INSTANT PROTEIN PO PWD PACKET
1.0000 | Freq: Three times a day (TID) | ORAL | Status: DC
  Administered 2021-03-21: 6 g via ORAL
  Filled 2021-03-19 (×7): qty 6

## 2021-03-19 MED ORDER — TRAZODONE HCL 50 MG PO TABS
50.0000 mg | ORAL_TABLET | Freq: Every day | ORAL | Status: DC
  Administered 2021-03-19 – 2021-03-20 (×2): 50 mg via ORAL
  Filled 2021-03-19 (×2): qty 1

## 2021-03-19 MED ORDER — FLUCONAZOLE 100 MG PO TABS
400.0000 mg | ORAL_TABLET | Freq: Every day | ORAL | Status: DC
  Administered 2021-03-20 – 2021-03-21 (×2): 400 mg via ORAL
  Filled 2021-03-19 (×2): qty 4

## 2021-03-19 MED ORDER — BOOST / RESOURCE BREEZE PO LIQD CUSTOM
1.0000 | Freq: Three times a day (TID) | ORAL | Status: DC
  Administered 2021-03-19 – 2021-03-21 (×6): 1 via ORAL

## 2021-03-19 NOTE — Progress Notes (Signed)
Gynecologic Oncology Progress Note  Subjective: She reports no abdominal pain at this time and reports improvement in discomfort after having silver nitrate application yesterday afternoon to a bleeding area in the abdominal wound bed. No further bleeding noted in her eakin pouch. No nausea or emesis reported. Ambulating without difficulty. Had BM yesterday and passing flatus. Eakin pouch with minimal dark green output and was not emptied overnight. She states she did not sleep due to IV issues and a screaming patient in another room. No chills or fever reported. Tenderness/soreness reported at the port site s/p removal on 03/18/21. She drank water overnight without issue. She reports having a headache which she relates to insomnia. She continues to have intermittent itching, more on her back, and reports improvement in the fine rash on her hands. All questions answered. No concerns voiced.  Objective: Vital signs in last 24 hours: Temp:  [97.8 F (36.6 C)-98.3 F (36.8 C)] 98.3 F (36.8 C) (08/16 0526) Pulse Rate:  [67-89] 74 (08/16 0526) Resp:  [18-21] 18 (08/16 0526) BP: (120-156)/(69-98) 132/98 (08/16 0526) SpO2:  [99 %-100 %] 99 % (08/16 0526) Last BM Date: 03/16/21  Intake/Output from previous day: 08/15 0701 - 08/16 0700 In: 2973.1 [P.O.:870; I.V.:1702.8; IV Piggyback:400.3] Out: 2840 [Urine:2800]  Physical Examination: General: alert, cooperative, and no distress Resp: clear to auscultation bilaterally Cardio: regular rate and rhythm, S1, S2 normal, no murmur, click, rub or gallop GI: soft, non-tender; bowel sounds normal; no masses,  no organomegaly and 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 She was admitted with ID consultation (recommendation for repeat cultures) and placed on Eraxis, Vancomycin, Cefepime, and Flagyl. She is now on Vancomycin and Diflucan IV.    Pain:  Pain is well-controlled on PRN medications. Dilaudid oral elixir ordered.  Heme: Hgb 9.1 and Hct 29.0 this am-stable. Continue to monitor.  ID: Sepsis- on Vancomycin and Flagyl IV. Appreciate ID consultation and recommendations.  CV: BP and HR stable. Continue to monitor. Echo performed 03/17/21.   GI:  Tolerating po: water overnight, diet to full liquids. Antiemetics ordered as needed. CT AP on 03/15/2021 with contrast seen throughout the colon. Elevated liver enzymes have decreased compared with 8/15 values. Was on TPN at home through her port due to enterocutaneous fistula. TPN on hold at this time. Plan for introduction on food orally with monitoring of output from the fistula.  GU: Voiding adequate amounts per patient. Creatinine 0.55 this am. UA without signs of infection from 03/17/21.    FEN: No critical values noted. K+ at  4.0 from 3.4 on 03/17/21. Na+ at 140 from 130 on 03/17/21.  Endo: Diabetes mellitus Type II, under good control.  CBG: CBG (last 3)  Recent Labs    03/18/21 1759 03/18/21 2310 03/19/21 0607  GLUCAP 95 108* 108*     Prophylaxis:  Lovenox on hold for port removal yesterday and will be resumed today, SCDs at the bedside.  Plan: Awaiting culture from port-a-cath tip. Full liquid diet as tolerated and monitor fistula output in response to increased oral intake. Strict I&O with eakin pouch emptying every shift. Continue IV antibiotics per ID. Appreciate ID recs. Sleep hygiene discussed with patient. Will discontinue elavil and order trazadone Continue plan of care per Dr. Boykin Peek. Delsa Sale, ID, Pharmacy.   LOS: 3 days    Sarah Newman 03/19/2021, 11:09 AM

## 2021-03-19 NOTE — Progress Notes (Signed)
Brief id update    Candida/staph epi clabsi s/p line removal 8/15 Tte negative for endocarditis   -no change in plan from previous id note -ID clinic f/u 9/01 @ 245 @ [facility] -will sign off

## 2021-03-19 NOTE — Progress Notes (Signed)
Initial Nutrition Assessment  DOCUMENTATION CODES:   Obesity unspecified  INTERVENTION:   -Boost Breeze po TID, each supplement provides 250 kcal and 9 grams of protein  -Beneprotein TID with meals, each provides 25 kcals and 6g protein  -Prosource Plus PO BID, each provides 100 kcals and 15g protein  NUTRITION DIAGNOSIS:   Increased nutrient needs related to altered GI function, chronic illness as evidenced by estimated needs.  GOAL:   Patient will meet greater than or equal to 90% of their needs  MONITOR:   PO intake, Supplement acceptance, Labs, Weight trends, I & O's  REASON FOR ASSESSMENT:   Consult Assessment of nutrition requirement/status  ASSESSMENT:   45 y.o patient h/o EOC.  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   Several days ago she was taken back for an abdominal wound exploration and washout; attempt at aspiration of TPN extravasation (no fluid collections identified). She had presented to the ED yesterday and blood cultures were obtained.  She returns today with persistent fever, chills, abdominal pain, N/V.   She denies any tenderness or redness at her Port-A-Cath site.  The blood cultures grew out S. Epidermidis and a candidal species.  8/15: s/p port removal  Patient in room with full liquid tray at bedside. Pt very excited to eat again. Pt has been on home TPN for 2 months. Attempting to eat some grits and sip on coffee this AM. Pt denies any nausea today. Concerned about what she needs to eat on a full liquid diet. Reviewed food options and encouraged liquids with protein. Did not tolerate Ensure well previously but is willing to try Boost Breeze and unflavored protein powders.  Pt states she made smoothies with protein powder at home, stated she could resume these at home.   Pt states her weight has remained stable ~193 lbs. Weighed  herself everyday at home.  Medications: D5 infusion  Labs reviewed: CBGs: 95-135   NUTRITION - FOCUSED PHYSICAL EXAM:  No depletions noted.  Diet Order:   Diet Order             Diet full liquid Room service appropriate? Yes; Fluid consistency: Thin  Diet effective now                   EDUCATION NEEDS:   Education needs have been addressed  Skin:  Skin Assessment: Skin Integrity Issues: Skin Integrity Issues:: Incisions Incisions: 8/8 abdomen  Last BM:  8/13  Height:   Ht Readings from Last 1 Encounters:  03/16/21 '5\' 2"'$  (1.575 m)    Weight:   Wt Readings from Last 1 Encounters:  03/16/21 87.5 kg    BMI:  Body mass index is 35.3 kg/m.  Estimated Nutritional Needs:   Kcal:  1800-2000  Protein:  75-100g  Fluid:  2L/day  Clayton Bibles, MS, RD, LDN Inpatient Clinical Dietitian Contact information available via Amion

## 2021-03-19 NOTE — Progress Notes (Signed)
Patients vancomycin trough critical at 57. NP Notified.

## 2021-03-19 NOTE — TOC Progression Note (Signed)
Transition of Care [facility]) - Progression Note    Patient Details  Name: Alaynna Loughnane MRN: [id number] Date of Birth: Feb 26, 1976  Transition of [facility]) CM/SW Contact  Daven Pinckney, Juliann Pulse, RN Phone Number: 03/19/2021, 12:54 PM  Clinical Narrative: Damaris Schooner to Herbie Baltimore (spouse) about d/c plans-Patient active w/[facility] HHRN-resume wound care instruction @ d/c-will need resumption orders;not on TPN in hospital;port removed. [employer] rep Pam was active PTA. Has own transport home.Continue to monitor for d/c plans.          Expected Discharge Plan and Services                                                 Social Determinants of Health (SDOH) Interventions    Readmission Risk Interventions Readmission Risk Prevention Plan 02/06/2021  Transportation Screening Complete  PCP or Specialist Appt within 5-7 Days Complete  Home Care Screening Complete  Medication Review (RN CM) Complete  Some recent data might be hidden

## 2021-03-19 NOTE — Progress Notes (Signed)
Pharmacy Antibiotic Note  Sarah Newman is a 45 y.o. female admitted on 03/16/2021 with EC fistula, Candida parapsilosis candidemia, Staph epi bacteremia, sepsis. Pharmacy has been consulted for Vancomycin dosing.   Vancomycin trough of 57 was drawn while dose was infusing, therefore is inaccurate.  If ID plan is to continue, will recheck trough tonight.  - Afebrile - WBC WNL - SCr stable  Plan: Continue Vancomycin 1g IV q12h.  Continue Fluconazole per ID Monitor renal function, cultures, clinical course, ID recommendations.   Height: '5\' 2"'$  (157.5 cm) Weight: 87.5 kg (193 lb) IBW/kg (Calculated) : 50.1  Temp (24hrs), Avg:98 F (36.7 C), Min:97.8 F (36.6 C), Max:98.3 F (36.8 C)  Recent Labs  Lab 03/15/21 1137 03/15/21 1138 03/16/21 1024 03/16/21 1255 03/17/21 0425 03/18/21 0323 03/18/21 1654 03/19/21 0321 03/19/21 0949  WBC 8.7  --  6.6  --  4.4 4.0 5.1 6.1  --   CREATININE 0.62  --  0.40*  --  0.62 0.51  --  0.55  --   LATICACIDVEN  --  1.7 1.3 1.1  --   --   --   --   --   VANCOTROUGH  --   --   --   --   --   --   --   --  57*     Estimated Creatinine Clearance: 93.2 mL/min (by C-G formula based on SCr of 0.55 mg/dL).    Allergies  Allergen Reactions   Carboplatin Shortness Of Breath    Antimicrobials this admission: 8/13 Vancomycin >> 8/13 Cefepime >> 8/15 8/13 Metronidazole >> 8/15 8/13 Anidulafungin >> 8/14 8/16 Fluconazole >>   Dose adjustments this admission: 8/16 VT = 57 - drawn AFTER dose given   Microbiology results: 8/12 BCx (peripheral): 1 set with MR-staph epi, Candida parapsilosis, other set (1/2 bottles) with Candida parapsilosis  8/12 BCx (PAC): 1/2 bottles with yeast  8/13 BCx (1 set from [facility] only): Staph epi, Candida parapsilosis in both bottles  8/13 UCx: not collected  8/13 Resp panel: negative  8/14 BCx: ngtd 8/15 cath tip cx: yeast  Thank you for allowing pharmacy to be a part of this patient's care.  Peggyann Juba,  PharmD, BCPS Pharmacy: [phone] 03/19/2021 12:37 PM

## 2021-03-20 LAB — CATH TIP CULTURE: Culture: >100000

## 2021-03-20 LAB — GLUCOSE, CAPILLARY
Glucose-Capillary: 136 mg/dL — ABNORMAL HIGH (ref 70–99)
Glucose-Capillary: 154 mg/dL — ABNORMAL HIGH (ref 70–99)
Glucose-Capillary: 108 mg/dL — ABNORMAL HIGH (ref 70–99)

## 2021-03-20 MED ORDER — ENOXAPARIN SODIUM 40 MG/0.4ML IJ SOSY
40.0000 mg | PREFILLED_SYRINGE | INTRAMUSCULAR | Status: DC
  Administered 2021-03-20 – 2021-03-21 (×2): 40 mg via SUBCUTANEOUS
  Filled 2021-03-20 (×2): qty 0.4

## 2021-03-20 NOTE — Progress Notes (Signed)
Gynecologic Oncology Progress Note  Subjective: She reports feeling well this am. No nausea or emesis reported. Tolerating water and breeze drinks. Ambulating without difficulty. Having BMs and passing flatus. Eakin pouch with minimal dark green output (around 20 cc or less). No chills or fever reported. Tenderness/soreness reported at the port site improving s/p removal on 03/18/21.  All questions answered. No concerns voiced. Husband at the bedside.  Objective: Vital signs in last 24 hours: Temp:  [98.1 F (36.7 C)-98.7 F (37.1 C)] 98.1 F (36.7 C) (08/17 0514) Pulse Rate:  [68-90] 68 (08/17 0514) Resp:  [16-18] 16 (08/17 0514) BP: (116-136)/(91-97) 116/91 (08/17 0514) SpO2:  [99 %-100 %] 99 % (08/17 0514) Weight:  [195 lb 1.7 oz (88.5 kg)] 195 lb 1.7 oz (88.5 kg) (08/17 0500) Last BM Date: 03/19/21  Intake/Output from previous day: 08/16 0701 - 08/17 0700 In: 2351.1 [P.O.:1080; I.V.:704.7; IV Piggyback:566.4] Out: 3050 [Urine:3050]  Physical Examination: General: alert, cooperative, and no distress Resp: clear to auscultation bilaterally Cardio: regular rate and rhythm, S1, S2 normal, no murmur, click, rub or gallop GI: soft, non-tender; bowel sounds normal; no masses,  no organomegaly and 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 She was admitted with ID consultation (recommendation for repeat cultures) and placed on Eraxis, Vancomycin, Cefepime, and Flagyl. She has now been transitioned to oral antibiotics.   Pain:  Pain is well-controlled on PRN medications. Dilaudid oral elixir ordered.  Heme: Hgb 9.1 and Hct 29.0 8/16 am-stable. Continue to monitor.  ID: Sepsis- transitioned to oral antibiotics today. Appreciate ID consultation and recommendations.  CV: BP and HR stable. Continue to monitor. Echo performed 03/17/21.   GI:  Tolerating po: Yes, water, Breeze drinks. Antiemetics ordered as needed. CT AP on 03/15/2021 with contrast seen throughout the colon. Elevated liver enzymes have decreased compared with 8/15 values. Was on TPN at home through her port due to enterocutaneous fistula. TPN on hold at this time. Plan for introduction of food orally with monitoring of output from the fistula.  GU: Voiding adequate amounts per patient. Creatinine 0.55 8/16 am. UA without signs of infection from 03/17/21.    FEN: No critical values noted. K+ at 4.0 on 8/16 from 3.4 on 03/17/21. Na+ at 140 on 8/16 from 130 on 03/17/21.  Endo: Diabetes mellitus Type II, under good control.  CBG: CBG (last 3)  Recent Labs    03/19/21 1157 03/19/21 1758 03/20/21 0518  GLUCAP 135* 127* 136*     Prophylaxis: Lovenox reordered, SCDs at the bedside.  Plan: Culture from port-a-cath tip pending. Diet advanced to regular diet as tolerated and monitor fistula output in response  to increased oral intake. Strict I&O with eakin pouch emptying every shift. Antibiotics changed to oral. Appreciate ID recs. Continue plan of care per Dr. Berline Lopes, [place], Pharmacy. Plan for am labs and probable discharge tomorrow afternoon.   LOS: 4 days    Sarah Newman 03/20/2021, 9:41 AM

## 2021-03-21 ENCOUNTER — Other Ambulatory Visit (HOSPITAL_COMMUNITY): Payer: Self-pay

## 2021-03-21 ENCOUNTER — Encounter: Payer: Medicaid Other | Admitting: Gynecologic Oncology

## 2021-03-21 LAB — MAGNESIUM: Magnesium: 1.7 mg/dL (ref 1.7–2.4)

## 2021-03-21 LAB — CBC
HCT: 28 % — ABNORMAL LOW (ref 36.0–46.0)
Hemoglobin: 8.7 g/dL — ABNORMAL LOW (ref 12.0–15.0)
MCH: 26.4 pg (ref 26.0–34.0)
MCHC: 31.1 g/dL (ref 30.0–36.0)
MCV: 84.8 fL (ref 80.0–100.0)
Platelets: 250 10*3/uL (ref 150–400)
RBC: 3.3 MIL/uL — ABNORMAL LOW (ref 3.87–5.11)
RDW: 14.3 % (ref 11.5–15.5)
WBC: 6.6 10*3/uL (ref 4.0–10.5)
nRBC: 0 % (ref 0.0–0.2)

## 2021-03-21 LAB — GLUCOSE, CAPILLARY: Glucose-Capillary: 136 mg/dL — ABNORMAL HIGH (ref 70–99)

## 2021-03-21 LAB — BASIC METABOLIC PANEL
Anion gap: 11 (ref 5–15)
BUN: <5 mg/dL — ABNORMAL LOW (ref 6–20)
CO2: 27 mmol/L (ref 22–32)
Calcium: 9.1 mg/dL (ref 8.9–10.3)
Chloride: 105 mmol/L (ref 98–111)
Creatinine, Ser: 0.64 mg/dL (ref 0.44–1.00)
GFR, Estimated: >60 mL/min (ref >60)
Glucose, Bld: 125 mg/dL — ABNORMAL HIGH (ref 70–99)
Potassium: 3.5 mmol/L (ref 3.5–5.1)
Sodium: 143 mmol/L (ref 135–145)

## 2021-03-21 LAB — PHOSPHORUS: Phosphorus: 4.6 mg/dL (ref 2.5–4.6)

## 2021-03-21 MED ORDER — FLUCONAZOLE 200 MG PO TABS
400.0000 mg | ORAL_TABLET | Freq: Every day | ORAL | 0 refills | Status: DC
  Filled 2021-03-21: qty 10, 5d supply, fill #0

## 2021-03-21 MED ORDER — ACETAMINOPHEN 325 MG PO TABS
650.0000 mg | ORAL_TABLET | Freq: Four times a day (QID) | ORAL | Status: DC | PRN
  Administered 2021-03-21: 650 mg via ORAL
  Filled 2021-03-21: qty 2

## 2021-03-21 MED ORDER — LINEZOLID 600 MG PO TABS
600.0000 mg | ORAL_TABLET | Freq: Two times a day (BID) | ORAL | 0 refills | Status: DC
  Filled 2021-03-21: qty 7, 4d supply, fill #0

## 2021-03-21 NOTE — Discharge Instructions (Addendum)
You have been prescribed Diflucan tablets that you will take once a day for the next ten days. You have also been prescribed linezolid that you will take every 12 hours for the next four days.  Continue use of the eakin pouch and keep a log of the output. Dr. Berline Lopes would also like you to keep a records of your calorie and protein intake and a log of your weight. Make sure you are taking in protein throughout the day. You can add protein powder or drink shakes.   Plan to follow up in the office with Dr. Berline Lopes on September 2 or sooner if needed. Please bring an extra eakin pouch to this visit. Please call us for any needs, concerns, or new symptoms such as fever/chills/increased abd pain etc.

## 2021-03-21 NOTE — Discharge Summary (Addendum)
Physician Discharge Summary  Patient ID: Roi Ciarcia MRN: [id number] DOB/AGE: [birth year] 45 y.o.  Admit date: 03/16/2021 Discharge date: 03/21/2021  Admission Diagnoses: Candidemia [facility])  Discharge Diagnoses:  Principal Problem:   Candidemia ([place]) Active Problems:   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HCC)   Elevated liver enzymes   Enterocutaneous fistula   Sepsis ([place])   Staphylococcus epidermidis bacteremia   Port or reservoir infection   Discharged Condition:  The patient is in good condition and stable for discharge.    Hospital Course: Janay Panos is a 45 year old female with a history of ovarian cancer and an enterocutaneous fistula receiving TPN at home via port for the past 2 months who presented to the ED on March 15, 2021 for evaluation of fever, emesis, abdominal tenderness. Blood cultures were drawn at that time and she underwent a CT scan. CT imaging showed the known enterocutaneous fistula, no collection or abscess, with contrast moving through the colon without obstruction. She was discharged home from this visit.   She was called back on Saturday, Mar 16, 2021 and advised to come back to the hospital for admission due to positive preliminary blood cultures growing staph epidermidis and candida parapsilosis. She had ID consultation (recommendation for repeat cultures) and was placed on Eraxis, Vancomycin, Cefepime, and Flagyl. She underwent port a cath removal with IR on  03/18/2021. The cath tip was sent for culture and resulted >100,000 CFU of candida parapsilosis. She was transitioned to oral antibiotics, linezolid per ID and diflucan, starting on 03/18/2021. Her WBC count remained in normal range during this time with slight elevation to 8.7 on 03/15/21.   TPN was on hold during this stay and will not be resumed at this time. She tolerated the introduction of oral intake (soft diet) well without a significant increase in fistula output. She was discharged home  after a 6 day stay in the hospital on the oral antibiotics listed above. She has follow up appointments with Dr. Berline Lopes and with Dr. Gale Journey with ID.   Consults: ID, Pharmacy, Nutrition, WOC, IR for port removal  Significant Diagnostic Studies: Labs including blood cultures, CT imaging, Echocardiogram  Treatments: IV hydration, IV antibiotics transitioned to oral, Port a cath removal  Discharge Exam: Blood pressure 120/86, pulse 77, temperature 97.9 F (36.6 C), temperature source Oral, resp. rate 16, height '5\' 2"'$  (1.575 m), weight 195 lb 1.7 oz (88.5 kg), last menstrual period 07/05/2019, SpO2 100 %. General appearance: alert, cooperative, and no distress Incision/Wound: Eakin pouch intact on the left abdomen with dark green output  Disposition: Discharge disposition: 01-Home or Self Care      Discharge Instructions     Call MD for:  difficulty breathing, headache or visual disturbances   Complete by: As directed    Call MD for:  extreme fatigue   Complete by: As directed    Call MD for:  hives   Complete by: As directed    Call MD for:  persistant dizziness or light-headedness   Complete by: As directed    Call MD for:  persistant nausea and vomiting   Complete by: As directed    Call MD for:  redness, tenderness, or signs of infection (pain, swelling, redness, odor or green/yellow discharge around incision site)   Complete by: As directed    Call MD for:  severe uncontrolled pain   Complete by: As directed    Call MD for:  temperature >100.4   Complete by: As directed  Diet - low sodium heart healthy   Complete by: As directed    Discharge instructions   Complete by: As directed    Continue monitoring your eakin pouch drainage   Discharge wound care:   Complete by: As directed    Continue use of eakin pouch and monitor the output   Driving Restrictions   Complete by: As directed    Do not take narcotics and drive. You need to make sure your reaction time has returned  and you can brake safely.   Increase activity slowly   Complete by: As directed    Lifting restrictions   Complete by: As directed    No lifting greater than 10 lbs.      Allergies as of 03/21/2021       Reactions   Carboplatin Shortness Of Breath        Medication List     STOP taking these medications    amoxicillin-clavulanate 875-125 MG tablet Commonly known as: AUGMENTIN   cephALEXin 500 MG capsule Commonly known as: KEFLEX   insulin aspart 100 UNIT/ML injection Commonly known as: novoLOG   oxyCODONE 5 MG immediate release tablet Commonly known as: Roxicodone       TAKE these medications    Accu-Chek Softclix Lancets lancets Use as instructed   acetaminophen 325 MG tablet Commonly known as: TYLENOL Take 650 mg by mouth every 6 (six) hours as needed for mild pain, fever or headache.   acetaminophen 650 MG suppository Commonly known as: TYLENOL Place 1 suppository (650 mg total) rectally every 6 (six) hours as needed for moderate pain.   fluconazole 200 MG tablet Commonly known as: DIFLUCAN Take 2 tablets by mouth daily. Start taking on: March 22, 2021   glucose blood test strip Use as instructed   HYDROmorphone HCl 1 MG/ML Liqd Commonly known as: DILAUDID Take 1 mL (1 mg total) by mouth every 4 (four) hours as needed for severe pain or moderate pain.   lidocaine-prilocaine cream Commonly known as: EMLA APPLY TO THE AFFECTED AREA AS DIRECTED TO ACCESS PORT   linezolid 600 MG tablet Commonly known as: ZYVOX Take 1 tablet  by mouth every 12  hours.   LORazepam 0.5 MG tablet Commonly known as: ATIVAN Take 1 tablet by mouth every 8 hours as needed for anxiety/insomnia. Do not take and drive.   omeprazole 40 MG capsule Commonly known as: PRILOSEC Take 1 capsule (40 mg total) by mouth daily.   ondansetron 4 MG disintegrating tablet Commonly known as: Zofran ODT Take 1 tablet (4 mg total) by mouth every 4 (four) hours as needed for up to 20  doses for nausea or vomiting.   promethazine 12.5 MG suppository Commonly known as: PHENERGAN Place 1 suppository (12.5 mg total) rectally every 8 (eight) hours as needed for up to 30 doses for nausea or vomiting.               Discharge Care Instructions  (From admission, onward)           Start     Ordered   03/21/21 0000  Discharge wound care:       Comments: Continue use of eakin pouch and monitor the output   03/21/21 1238            Follow-up Information     Lafonda Mosses, MD Follow up on 04/05/2021.   Specialty: Gynecologic Oncology Why: at 3:30pm at the [facility]. Bring an extra eakin pouch to the visit  Contact information: [place] [address] [phone]         Jabier Mutton, MD Follow up on 04/04/2021.   Specialty: Infectious Diseases Why: at 2:45pm for Infectious Disease follow up Contact information: [address] [place] [address] [phone]                 Greater than thirty minutes were spend for face to face discharge instructions and discharge orders/summary in EPIC.   Signed: [name] 03/21/2021, 1:59 PM

## 2021-03-21 NOTE — Progress Notes (Signed)
Assessment unchanged. Pt verbalized understanding of dc instructions through teach back including meds to resume and follow up care. Discharged via foot per request accompanied by husband and NT.

## 2021-03-21 NOTE — Progress Notes (Signed)
Chaplain engaged in a follow-up visit with [name] and was able to meet her husband.  Sarah Newman appears to be in great spirits and is thankful for the progress that has been made.  She is looking forward to getting home.  Being able to eat and not being in so much pain has made a big difference.  Chaplain continues to offer support.    03/21/21 1100  Clinical Encounter Type  Visited With Patient and family together  Visit Type Spiritual support;Follow-up

## 2021-03-21 NOTE — Progress Notes (Signed)
MD [name] was called because patient was complaining of a headache. Verbal order for PO Tylenol '650mg'$  every 6 hours given.

## 2021-03-22 LAB — MISC LABCORP TEST (SEND OUT)
LabCorp test name: 183119
Labcorp test code: 183119

## 2021-03-22 LAB — CULTURE, BLOOD (ROUTINE X 2)
Culture: NO GROWTH
Culture: NO GROWTH
Special Requests: ADEQUATE
Special Requests: ADEQUATE

## 2021-03-24 LAB — CULTURE, BLOOD (ROUTINE X 2)
Special Requests: ADEQUATE
Special Requests: ADEQUATE

## 2021-03-24 LAB — CULTURE, BLOOD (SINGLE): Special Requests: ADEQUATE

## 2021-04-02 ENCOUNTER — Telehealth: Payer: Self-pay

## 2021-04-02 NOTE — Telephone Encounter (Signed)
Spoke with Lucianne Muss, she is going to send a picture of the swelling near her umbilicus via email to [email or web address]. She is unable to upload the picture via mychart. Instructed patient to monitor her symptoms and seek medical care if she begins to feel unwell or her symptoms worsen. Patient verbalized understanding. Patient will be contacted once image has been reviewed.

## 2021-04-02 NOTE — Telephone Encounter (Signed)
Returned call from [place]. Patient states "I think I need surgery again." She reports that the incision near her belly button has closed and has started swelling. She first noticed the swelling yesterday and thought it was from the eakin pouch. Patient removed pouch but swelling has increased. She reports placing gauze over the open site and changing it as needed. She states when she presses on the area pea green drainage comes from her fistula site. The swelling is hard to the touch, red, tender and warm. She has pain with eating and has been taking oxycodone which provides some relief. She denies nausea, vomiting, or fevers.

## 2021-04-03 ENCOUNTER — Telehealth: Payer: Self-pay | Admitting: *Deleted

## 2021-04-03 ENCOUNTER — Encounter: Payer: Self-pay | Admitting: Hematology and Oncology

## 2021-04-03 ENCOUNTER — Ambulatory Visit (HOSPITAL_COMMUNITY): Payer: Medicaid Other

## 2021-04-03 ENCOUNTER — Other Ambulatory Visit: Payer: Self-pay | Admitting: Gynecologic Oncology

## 2021-04-03 DIAGNOSIS — L539 Erythematous condition, unspecified: Secondary | ICD-10-CM

## 2021-04-03 DIAGNOSIS — K632 Fistula of intestine: Secondary | ICD-10-CM

## 2021-04-03 DIAGNOSIS — R1010 Upper abdominal pain, unspecified: Secondary | ICD-10-CM

## 2021-04-03 DIAGNOSIS — R1900 Intra-abdominal and pelvic swelling, mass and lump, unspecified site: Secondary | ICD-10-CM

## 2021-04-03 NOTE — Progress Notes (Signed)
See RN note. Patient complaining of abdominal swelling, erythema around the fistula site, increased abdominal pain. When she pushes on the swollen area, increased output comes from the fistula site. Plan for CT scan per Dr. Denman George to further evaluate.

## 2021-04-03 NOTE — Telephone Encounter (Signed)
errort

## 2021-04-03 NOTE — Telephone Encounter (Signed)
Attempted to reach the patient to cancel appt for 9/2, left a message that the office will call her back tomorrow

## 2021-04-03 NOTE — Telephone Encounter (Signed)
Per patient request called and moved CT from today to tomorrow

## 2021-04-03 NOTE — Telephone Encounter (Signed)
Spoke with [facility], NP has ordered a stat CT scan. Instructed patient to come to [place] as soon as she can to have her test. Patient verbalized understanding and states she can be here by 2:30.

## 2021-04-04 ENCOUNTER — Encounter (HOSPITAL_COMMUNITY): Payer: Self-pay

## 2021-04-04 ENCOUNTER — Other Ambulatory Visit (HOSPITAL_COMMUNITY): Payer: Self-pay

## 2021-04-04 ENCOUNTER — Ambulatory Visit (INDEPENDENT_AMBULATORY_CARE_PROVIDER_SITE_OTHER): Payer: Medicaid Other | Admitting: Internal Medicine

## 2021-04-04 ENCOUNTER — Ambulatory Visit (HOSPITAL_COMMUNITY)
Admission: RE | Admit: 2021-04-04 | Discharge: 2021-04-04 | Disposition: A | Discharge status: Home / Self Care | Payer: Medicaid Other | Source: Ambulatory Visit | Attending: Gynecologic Oncology | Admitting: Gynecologic Oncology

## 2021-04-04 ENCOUNTER — Other Ambulatory Visit: Payer: Self-pay | Admitting: Gynecologic Oncology

## 2021-04-04 ENCOUNTER — Other Ambulatory Visit: Payer: Self-pay

## 2021-04-04 ENCOUNTER — Telehealth: Payer: Self-pay

## 2021-04-04 ENCOUNTER — Encounter: Payer: Self-pay | Admitting: Internal Medicine

## 2021-04-04 ENCOUNTER — Telehealth: Payer: Self-pay | Admitting: Gynecologic Oncology

## 2021-04-04 VITALS — BP 105/73 | HR 100 | Temp 97.8°F | Wt 185.0 lb

## 2021-04-04 DIAGNOSIS — K632 Fistula of intestine: Secondary | ICD-10-CM

## 2021-04-04 DIAGNOSIS — R7881 Bacteremia: Secondary | ICD-10-CM

## 2021-04-04 DIAGNOSIS — R1900 Intra-abdominal and pelvic swelling, mass and lump, unspecified site: Secondary | ICD-10-CM

## 2021-04-04 DIAGNOSIS — C801 Malignant (primary) neoplasm, unspecified: Secondary | ICD-10-CM | POA: Diagnosis not present

## 2021-04-04 DIAGNOSIS — L539 Erythematous condition, unspecified: Secondary | ICD-10-CM | POA: Diagnosis present

## 2021-04-04 DIAGNOSIS — R1010 Upper abdominal pain, unspecified: Secondary | ICD-10-CM

## 2021-04-04 MED ORDER — IOHEXOL 9 MG/ML PO SOLN
ORAL | Status: AC
  Administered 2021-04-04: 1000 mL via ORAL
  Filled 2021-04-04: qty 1000

## 2021-04-04 MED ORDER — IOHEXOL 9 MG/ML PO SOLN: 1000.0000 mL | ORAL | Status: AC

## 2021-04-04 MED ORDER — AMOXICILLIN-POT CLAVULANATE 875-125 MG PO TABS
1.0000 | ORAL_TABLET | Freq: Two times a day (BID) | ORAL | 0 refills | Status: DC
  Filled 2021-04-04: qty 20, 10d supply, fill #0

## 2021-04-04 MED ORDER — IOHEXOL 350 MG/ML SOLN
75.0000 mL | Freq: Once | INTRAVENOUS | Status: AC | PRN
  Administered 2021-04-04: 75 mL via INTRAVENOUS

## 2021-04-04 NOTE — Progress Notes (Signed)
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   Postoperative abscess 02/02/2021   Abscess    Intermittent small bowel obstruction due to adhesions ([place]) 01/23/2021   Intra-abdominal adhesions    Nausea without vomiting 01/11/2021   SBO (small bowel obstruction) ([place]) 01/11/2021   Poor dentition 12/10/2020   Pancytopenia, acquired ([place]) 12/10/2020   Allergic reaction 10/15/2020   Physical debility 10/01/2020   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   Constipation 09/20/2020   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   Cancer associated pain 09/14/2020   Lab test positive for detection of COVID-19 virus 09/10/2020   Hypokalemia 08/30/2020   Obesity, Class II, BMI 35-39.9 07/17/2020   Anemia, chronic disease 05/29/2020   UTI (urinary tract infection) 01/09/2020   Dysuria 01/06/2020   Anemia due to antineoplastic chemotherapy 12/05/2019   Gastritis 12/05/2019   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/2019   Weight gain 10/25/2019   Vaginal dryness 10/24/2019   Drug-induced hyperglycemia 10/03/2019   Genetic testing 09/23/2019   Bone pain 09/13/2019   Restless leg 09/13/2019   Elevated liver enzymes 09/13/2019   Family history of ovarian cancer    Family history of prostate cancer    Family history of colon cancer    Family history of breast cancer    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   MRN: [id number]  Hospital f/u  bacteremia  HPI:  Sarah Newman is a 45 y.o. female here for f/u clabsi  She has a port for chemo treatment and was seen in the hospital early 03/2021 for sepsis  W/u showed EC fistula juxtaposed with midline small fluid collection She also has staph epi/candida bacteremia which were thought to be related to the port which was removed  The cath tip grew candida parapsilosis Tte was negative 8/14 repeat bcx negative  Plan to treat short course which she finished vanc on 8/23 and diflucan 8/30  She denies f/c She has repeat abd ct due to swelling around the EC fistula and noted green mucoid discharge. A repeat ct showed enlarged fluid collection 3--> 6 cm      Allergies  Allergen Reactions   Carboplatin Shortness Of Breath      Outpatient Medications Prior to Visit  Medication Sig Dispense Refill   Accu-Chek Softclix Lancets lancets Use as instructed 100 each 12   acetaminophen (TYLENOL) 325 MG tablet Take 650 mg by mouth every 6 (six) hours as needed for mild pain, fever or headache.     amoxicillin-clavulanate (AUGMENTIN) 875-125 MG tablet Take 1 tablet by mouth 2 (two) times daily. 20 tablet 0   glucose blood test strip Use as instructed 100 each 12   HYDROmorphone HCl (DILAUDID) 1 MG/ML LIQD Take 1 mL (1 mg total) by mouth every 4 (four) hours as needed for severe pain or moderate pain. (Patient not taking: Reported on 04/04/2021) 30 mL 0   lidocaine-prilocaine (EMLA) cream APPLY TO THE  AFFECTED AREA AS DIRECTED TO ACCESS PORT 30 g 11   LORazepam (ATIVAN) 0.5 MG tablet Take 1 tablet by mouth every 8 hours as needed for anxiety/insomnia. Do not take and drive. (Patient not taking: Reported on 04/04/2021) 15 tablet 0   omeprazole (PRILOSEC) 40 MG capsule Take 1 capsule (40 mg total) by mouth daily. 30 capsule 3   ondansetron (ZOFRAN ODT) 4 MG disintegrating tablet Take 1 tablet (4 mg total) by mouth every 4 (four) hours as needed for up to 20 doses for nausea or vomiting. 20  tablet 0   promethazine (PHENERGAN) 12.5 MG suppository Place 1 suppository (12.5 mg total) rectally every 8 (eight) hours as needed for up to 30 doses for nausea or vomiting. (Patient not taking: Reported on 04/04/2021) 30 each 0   acetaminophen (TYLENOL) 650 MG suppository Place 1 suppository (650 mg total) rectally every 6 (six) hours as needed for moderate pain. 15 suppository 2   fluconazole (DIFLUCAN) 200 MG tablet Take 2 tablets by mouth daily. (Patient not taking: No sig reported) 10 tablet 0   linezolid (ZYVOX) 600 MG tablet Take 1 tablet  by mouth every 12  hours. (Patient not taking: No sig reported) 7 tablet 0   No facility-administered medications prior to visit.     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  Newly married as of October 2020.   2 boys, age 8 and 55   Social Determinants of Health   Emergency planning/management officer Strain: Not on file  Food Insecurity: Not on file  Transportation Needs: Not on file  Physical Activity: Not on file  Stress: Not on file  Social Connections: Not on file  Intimate Partner Violence: Not on file      Review of Systems     Objective:    BP 105/73   Pulse 100   Temp 97.8 F (36.6 C) (Oral)   Wt 185 lb (83.9 kg)   LMP 07/05/2019   SpO2 100%   BMI 33.84 kg/m  Nursing note and vital signs reviewed.  Physical Exam    General/constitutional: no distress, pleasant HEENT:  Normocephalic, PER, Conj Clear, EOMI, Oropharynx clear Neck supple CV: rrr no mrg Lungs: clear to auscultation, normal respiratory effort Abd: Soft, Nontender -- fistula bag present, around it with murky green discharge Ext: no edema Skin: No Rash Neuro: nonfocal MSK: no peripheral joint swelling/tenderness/warmth; back spines nontender    Labs: Lab Results  Component Value Date   WBC 6.6 03/21/2021   HGB 8.7 (L) 03/21/2021   HCT 28.0 (L) 03/21/2021   MCV 84.8 03/21/2021   PLT 250 [id number]   Last metabolic panel Lab Results  Component Value Date   GLUCOSE 125 (H) 03/21/2021   NA 143 03/21/2021   K 3.5 03/21/2021   CL 105 03/21/2021  CO2 27 03/21/2021   BUN <5 (L) 03/21/2021   CREATININE 0.64 03/21/2021   GFRNONAA >60 03/21/2021   GFRAA >60 04/05/2020   CALCIUM 9.1 03/21/2021   PHOS 4.6 03/21/2021   PROT 6.8 03/19/2021   ALBUMIN 3.0 (L) 03/19/2021   BILITOT 0.4 03/19/2021   ALKPHOS 181 (H) 03/19/2021   AST 85 (H) 03/19/2021   ALT 176 (H) 03/19/2021   ANIONGAP 11 03/21/2021    Micro:  Serology:  Imaging: 8/14 tte  1. Left ventricular ejection fraction, by estimation, is 55 to 60%. The  left ventricle has normal function. The left ventricle has no regional  wall motion abnormalities. Left ventricular diastolic parameters were  normal.   2. Right ventricular systolic function is low normal. The right  ventricular size is normal. Tricuspid regurgitation signal is inadequate  for assessing PA pressure.   3. The mitral valve is grossly normal. Trivial mitral valve  regurgitation.   4. The aortic valve is tricuspid. Aortic valve regurgitation is not  visualized.   5. The inferior vena cava is normal in size with greater than 50%  respiratory variability, suggesting right atrial pressure of 3 mmHg.   Conclusion(s)/Recommendation(s): No evidence of valvular vegetations on  this transthoracic echocardiogram. Would recommend a transesophageal  echocardiogram  to exclude infective endocarditis if clinically indicated.        8/12 abd pelv ct with contrast 1. Increased fat stranding and increased size of air contrast collection in the anterior abdominal wall at site of known enterocutaneous fistula, with tract extending to the skin surface. This currently measures 2.7 x 1.4 cm, previously 1.3 cm. 2. No new subcutaneous collection or abscess. 3. Thin sliver of fluid adjacent to the anterior right lobe of the liver measures 0.9 x 2.3 x 1.6 cm, no definite peripheral enhancement or internal air. This likely represents postoperative fluid, of indeterminate sterility. 4. Equivocal distal gastric wall thickening. 5. Trace right pleural effusion. 6. Perigastric lymph node or nodularity measuring 10 mm in the gastrohepatic ligament, not significantly changed from recent exam.     9/01 abd/ct 1. There has been interval enlargement of a multiloculated air and fluid collection in the superficial soft tissues of the midline ventral abdomen, overall dimensions of this lesion approximately 6.6 x 4.4 cm, previously noted unilocular collection approximately 2.7 x 1.4 cm. This is concerning for abscess. 2. There is no enteric contrast within the abdominal wall collection at this time, however by report patient has known enterocutaneous fistula. 3. Unchanged post treatment appearance of the abdomen, status post hysterectomy, oophorectomy, and omentectomy, with unchanged thickening and stranding of the peritoneum, without direct evidence of peritoneal nodularity. 4. Small volume perihepatic ascites.   Assessment & Plan:   Problem List Items Addressed This Visit   None     No orders of the defined types were placed in this encounter.    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 Doesn't appear to have endocarditis; repeat bcx today will help ascertain that even more  Unfortunately the fluid collection around her EC fistula has enlarged and she'll be admitted to [facility] long for I&D. I advise her to ask for culture on this as well. I am not sure if she'll need abx. If the fluid collection is contained within the [facility] fistula she might now. There is no evidence peritonitis or again sepsis today     PLAN: Bcx x2 in clinic today When she is admitted tomorrow, advise to have inpatient id team review case and decide if the fluid collection near [facility] fistula needs to be treated and to review bcx today  No schedule f/u today; will defer to inpatient's id team decision  Follow-up: No follow-ups on file.   I have spent a total  of 20 minutes of face-to-face and non-face-to-face time, excluding clinical staff time, preparing to see patient, ordering tests and/or medications, and provide counseling the patient    Jabier Mutton, [place] [facility] [place] [phone]  pager   [phone] cell 04/04/2021, 3:53 PM

## 2021-04-04 NOTE — Progress Notes (Signed)
Evaluated patient's CT scan. There is a loculated fluid collection superior to the fistula site. Per patient, when this site is massaged, stool like fluid emanates from the fistula. She is afebrile. Images (photographs) of the abdomen show no obvious cellulitis.   I suspect this is the fistula tract, not an abscess per se. Given how loculated it is, and the fact that it is already spontaneously draining, I do not feel that placing a drain in this will be particularly helpful.  I am going to prescribe 10 days of augmentin broad spectrum empiric antibiotic coverage for presumed mild cellulitis.   We will counsel the patient about signs of progressive cellulitis (skin changes, enlarging "mass" and fever) and have her present for consideration of :  IV antibiotics Opening the site overlying this collection to facilitate better drainage.   Thereasa Solo, MD

## 2021-04-04 NOTE — Telephone Encounter (Signed)
Told Sarah Newman that the CT scan shows an abscess above the fistula underneath the Skin.   Sarah Newman states that the skin sis sore to touch but is not red.  Afebrile. It hurts to massage this area.  Some green fluid bubbles up through the hole. The hole in the fistula is pin point in size. Sarah Newman wonders if the hole is to small for the volume of fluid that is trying to come out.  She would like a refill on her Oxycodone 5 mg.  The Oxycodone is minimally effective with abdominal pain. She occasionally takes 2 ibuprofen 200 mg tabs. The ibuprofen seems to help decrease the swelling in the skin on top of the fistula. Will review with Joylene John, NP and call her back with recommendations

## 2021-04-04 NOTE — Patient Instructions (Signed)
Will do blood culture today   When you have surgery tomorrow, please make sure your team call for the inpatient Infectious Disease team to see you and see if further antibiotics is needed   The port culture previously confirms candida (yeast) source was from that. The staph epididermis unclear what source was. Today's blood culture will determine if we had adequately treated (this can be reviewed by the inpatient ID team in 1-2 days)

## 2021-04-04 NOTE — Addendum Note (Signed)
Addended byPrudencio Pair T on: 04/04/2021 04:35 PM   Modules accepted: Orders

## 2021-04-04 NOTE — Telephone Encounter (Signed)
I spoke with Sarah Newman after reviewing her imaging and case with the on call general surgical team. We agreed that she appears to be healing her skin prior to healing her bowel defect and therefore is continuously accumulating stool in the abdominal wall. She has signs of impending cellulitis, and we are nearing a holiday weekend during which she will have limited access to care from providers who know her complicated case, and potentially delays in receiving optimal care. Therefore, to prevent worsening cellulitis and clinical deterioration, I recommend proceeding with opening the skin defect to accept a mushroom catheter or red rubber (stitched to skin), to maintain a cutaneous drainage point while awaiting for the bowel to heal.   I explained the planned procedure to the patient.  Unable to direct admit the patient due to the hospital being full. Therefore advised her to go to the ER tonight, and we will transfer her directly to the [facility] if a bed is not available prior to anticipated OR start time (10am).   Recommend NPO after midnight.  Patient vocalized understanding and agreement with the plan.  Thereasa Solo, MD

## 2021-04-05 ENCOUNTER — Ambulatory Visit (HOSPITAL_COMMUNITY)
Admission: RE | Admit: 2021-04-05 | Discharge: 2021-04-06 | Disposition: A | Discharge status: Home / Self Care | Payer: Medicaid Other | Attending: Gynecologic Oncology | Admitting: Gynecologic Oncology

## 2021-04-05 ENCOUNTER — Encounter (HOSPITAL_COMMUNITY)
Admission: RE | Disposition: A | Discharge status: Home / Self Care | Payer: Self-pay | Source: Home / Self Care | Attending: Gynecologic Oncology

## 2021-04-05 ENCOUNTER — Other Ambulatory Visit: Payer: Self-pay

## 2021-04-05 ENCOUNTER — Ambulatory Visit (HOSPITAL_COMMUNITY): Payer: Medicaid Other | Admitting: Anesthesiology

## 2021-04-05 ENCOUNTER — Encounter: Payer: Medicaid Other | Admitting: Gynecologic Oncology

## 2021-04-05 ENCOUNTER — Encounter (HOSPITAL_COMMUNITY): Payer: Self-pay | Admitting: Gynecologic Oncology

## 2021-04-05 ENCOUNTER — Ambulatory Visit: Payer: Medicaid Other | Admitting: Gynecologic Oncology

## 2021-04-05 DIAGNOSIS — Z8543 Personal history of malignant neoplasm of ovary: Secondary | ICD-10-CM | POA: Diagnosis not present

## 2021-04-05 DIAGNOSIS — R188 Other ascites: Secondary | ICD-10-CM | POA: Diagnosis present

## 2021-04-05 DIAGNOSIS — K632 Fistula of intestine: Secondary | ICD-10-CM | POA: Diagnosis not present

## 2021-04-05 DIAGNOSIS — Z20822 Contact with and (suspected) exposure to covid-19: Secondary | ICD-10-CM | POA: Insufficient documentation

## 2021-04-05 DIAGNOSIS — L03319 Cellulitis of trunk, unspecified: Secondary | ICD-10-CM | POA: Diagnosis present

## 2021-04-05 DIAGNOSIS — L02211 Cutaneous abscess of abdominal wall: Secondary | ICD-10-CM | POA: Insufficient documentation

## 2021-04-05 DIAGNOSIS — L03311 Cellulitis of abdominal wall: Secondary | ICD-10-CM | POA: Diagnosis not present

## 2021-04-05 DIAGNOSIS — L02219 Cutaneous abscess of trunk, unspecified: Secondary | ICD-10-CM | POA: Diagnosis present

## 2021-04-05 LAB — CBC
HCT: 32.7 % — ABNORMAL LOW (ref 36.0–46.0)
Hemoglobin: 10.2 g/dL — ABNORMAL LOW (ref 12.0–15.0)
MCH: 26.4 pg (ref 26.0–34.0)
MCHC: 31.2 g/dL (ref 30.0–36.0)
MCV: 84.5 fL (ref 80.0–100.0)
Platelets: 330 10*3/uL (ref 150–400)
RBC: 3.87 MIL/uL (ref 3.87–5.11)
RDW: 14.1 % (ref 11.5–15.5)
WBC: 5.5 10*3/uL (ref 4.0–10.5)
nRBC: 0 % (ref 0.0–0.2)

## 2021-04-05 LAB — BASIC METABOLIC PANEL
Anion gap: 12 (ref 5–15)
BUN: 6 mg/dL (ref 6–20)
CO2: 25 mmol/L (ref 22–32)
Calcium: 9.7 mg/dL (ref 8.9–10.3)
Chloride: 103 mmol/L (ref 98–111)
Creatinine, Ser: 0.5 mg/dL (ref 0.44–1.00)
GFR, Estimated: >60 mL/min (ref >60)
Glucose, Bld: 120 mg/dL — ABNORMAL HIGH (ref 70–99)
Potassium: 3.8 mmol/L (ref 3.5–5.1)
Sodium: 140 mmol/L (ref 135–145)

## 2021-04-05 LAB — SARS CORONAVIRUS 2 BY RT PCR (HOSPITAL ORDER, PERFORMED IN ~~LOC~~ HOSPITAL LAB): SARS Coronavirus 2: NEGATIVE

## 2021-04-05 SURGERY — EXAM UNDER ANESTHESIA
Anesthesia: General

## 2021-04-05 MED ORDER — PROPOFOL 10 MG/ML IV BOLUS
INTRAVENOUS | Status: DC | PRN
  Administered 2021-04-05: 150 mg via INTRAVENOUS

## 2021-04-05 MED ORDER — TRAMADOL HCL 50 MG PO TABS: 50.0000 mg | ORAL_TABLET | Freq: Four times a day (QID) | ORAL | Status: DC | PRN

## 2021-04-05 MED ORDER — MIDAZOLAM HCL 2 MG/2ML IJ SOLN
INTRAMUSCULAR | Status: AC
  Filled 2021-04-05: qty 2

## 2021-04-05 MED ORDER — BUPIVACAINE LIPOSOME 1.3 % IJ SUSP
INTRAMUSCULAR | Status: DC | PRN
  Administered 2021-04-05: 20 mL

## 2021-04-05 MED ORDER — ONDANSETRON HCL 4 MG PO TABS: 4.0000 mg | ORAL_TABLET | Freq: Four times a day (QID) | ORAL | Status: DC | PRN

## 2021-04-05 MED ORDER — KETOROLAC TROMETHAMINE 15 MG/ML IJ SOLN: 15.0000 mg | INTRAMUSCULAR | Status: DC

## 2021-04-05 MED ORDER — LORAZEPAM 0.5 MG PO TABS
0.5000 mg | ORAL_TABLET | Freq: Three times a day (TID) | ORAL | Status: DC | PRN
  Administered 2021-04-06: 0.5 mg via ORAL
  Filled 2021-04-05: qty 1

## 2021-04-05 MED ORDER — PANTOPRAZOLE SODIUM 40 MG PO TBEC
40.0000 mg | DELAYED_RELEASE_TABLET | Freq: Every day | ORAL | Status: DC
  Administered 2021-04-05 – 2021-04-06 (×2): 40 mg via ORAL
  Filled 2021-04-05 (×2): qty 1

## 2021-04-05 MED ORDER — GABAPENTIN 300 MG PO CAPS
300.0000 mg | ORAL_CAPSULE | ORAL | Status: AC
  Administered 2021-04-05: 300 mg via ORAL
  Filled 2021-04-05: qty 1

## 2021-04-05 MED ORDER — BUPIVACAINE HCL 0.25 % IJ SOLN
INTRAMUSCULAR | Status: DC | PRN
  Administered 2021-04-05: 10 mL

## 2021-04-05 MED ORDER — LACTATED RINGERS IV SOLN: INTRAVENOUS | Status: DC

## 2021-04-05 MED ORDER — ONDANSETRON HCL 4 MG/2ML IJ SOLN
INTRAMUSCULAR | Status: AC
  Filled 2021-04-05: qty 2

## 2021-04-05 MED ORDER — LIDOCAINE HCL (CARDIAC) PF 100 MG/5ML IV SOSY
PREFILLED_SYRINGE | INTRAVENOUS | Status: DC | PRN
  Administered 2021-04-05: 80 mg via INTRAVENOUS

## 2021-04-05 MED ORDER — BUPIVACAINE LIPOSOME 1.3 % IJ SUSP
INTRAMUSCULAR | Status: AC
  Filled 2021-04-05: qty 20

## 2021-04-05 MED ORDER — ACETAMINOPHEN 500 MG PO TABS
1000.0000 mg | ORAL_TABLET | ORAL | Status: AC
  Administered 2021-04-05: 1000 mg via ORAL
  Filled 2021-04-05: qty 2

## 2021-04-05 MED ORDER — DEXAMETHASONE SODIUM PHOSPHATE 10 MG/ML IJ SOLN
INTRAMUSCULAR | Status: AC
  Filled 2021-04-05: qty 1

## 2021-04-05 MED ORDER — SODIUM CHLORIDE 0.9 % IV SOLN: INTRAVENOUS | Status: DC

## 2021-04-05 MED ORDER — PROMETHAZINE HCL 25 MG/ML IJ SOLN: 6.2500 mg | INTRAMUSCULAR | Status: DC | PRN

## 2021-04-05 MED ORDER — FENTANYL CITRATE (PF) 100 MCG/2ML IJ SOLN
INTRAMUSCULAR | Status: DC | PRN
  Administered 2021-04-05 (×2): 50 ug via INTRAVENOUS

## 2021-04-05 MED ORDER — ONDANSETRON HCL 4 MG/2ML IJ SOLN
INTRAMUSCULAR | Status: DC | PRN
  Administered 2021-04-05: 4 mg via INTRAVENOUS

## 2021-04-05 MED ORDER — FENTANYL CITRATE (PF) 100 MCG/2ML IJ SOLN
INTRAMUSCULAR | Status: AC
  Filled 2021-04-05: qty 2

## 2021-04-05 MED ORDER — SUGAMMADEX SODIUM 200 MG/2ML IV SOLN
INTRAVENOUS | Status: DC | PRN
  Administered 2021-04-05: 200 mg via INTRAVENOUS

## 2021-04-05 MED ORDER — OXYCODONE HCL 5 MG PO TABS
5.0000 mg | ORAL_TABLET | ORAL | Status: DC | PRN
  Administered 2021-04-05 (×2): 10 mg via ORAL
  Administered 2021-04-06: 5 mg via ORAL
  Administered 2021-04-06: 10 mg via ORAL
  Filled 2021-04-05: qty 1
  Filled 2021-04-05 (×2): qty 2
  Filled 2021-04-05: qty 1
  Filled 2021-04-05: qty 2

## 2021-04-05 MED ORDER — ENOXAPARIN SODIUM 40 MG/0.4ML IJ SOSY
40.0000 mg | PREFILLED_SYRINGE | INTRAMUSCULAR | Status: DC
  Administered 2021-04-06: 40 mg via SUBCUTANEOUS
  Filled 2021-04-05: qty 0.4

## 2021-04-05 MED ORDER — SODIUM CHLORIDE (PF) 0.9 % IJ SOLN
INTRAMUSCULAR | Status: AC
  Filled 2021-04-05: qty 40

## 2021-04-05 MED ORDER — HYDROMORPHONE HCL 1 MG/ML IJ SOLN: 0.2000 mg | INTRAMUSCULAR | Status: DC | PRN

## 2021-04-05 MED ORDER — ONDANSETRON HCL 4 MG/2ML IJ SOLN: 4.0000 mg | Freq: Four times a day (QID) | INTRAMUSCULAR | Status: DC | PRN

## 2021-04-05 MED ORDER — ROCURONIUM BROMIDE 100 MG/10ML IV SOLN
INTRAVENOUS | Status: DC | PRN
  Administered 2021-04-05: 50 mg via INTRAVENOUS

## 2021-04-05 MED ORDER — MIDAZOLAM HCL 5 MG/5ML IJ SOLN
INTRAMUSCULAR | Status: DC | PRN
  Administered 2021-04-05: 2 mg via INTRAVENOUS

## 2021-04-05 MED ORDER — SODIUM CHLORIDE 0.9 % IV SOLN
2.0000 g | INTRAVENOUS | Status: AC
  Administered 2021-04-05: 2 g via INTRAVENOUS
  Filled 2021-04-05: qty 2

## 2021-04-05 MED ORDER — CHLORHEXIDINE GLUCONATE 0.12 % MT SOLN
15.0000 mL | Freq: Once | OROMUCOSAL | Status: AC
  Administered 2021-04-05: 15 mL via OROMUCOSAL

## 2021-04-05 MED ORDER — SCOPOLAMINE 1 MG/3DAYS TD PT72
1.0000 | MEDICATED_PATCH | TRANSDERMAL | Status: DC
  Administered 2021-04-05: 1.5 mg via TRANSDERMAL
  Filled 2021-04-05: qty 1

## 2021-04-05 MED ORDER — ROCURONIUM BROMIDE 10 MG/ML (PF) SYRINGE
PREFILLED_SYRINGE | INTRAVENOUS | Status: AC
  Filled 2021-04-05: qty 10

## 2021-04-05 MED ORDER — FENTANYL CITRATE PF 50 MCG/ML IJ SOSY: 25.0000 ug | PREFILLED_SYRINGE | INTRAMUSCULAR | Status: DC | PRN

## 2021-04-05 MED ORDER — PROPOFOL 10 MG/ML IV BOLUS
INTRAVENOUS | Status: AC
  Filled 2021-04-05: qty 20

## 2021-04-05 MED ORDER — PHENYLEPHRINE 40 MCG/ML (10ML) SYRINGE FOR IV PUSH (FOR BLOOD PRESSURE SUPPORT)
PREFILLED_SYRINGE | INTRAVENOUS | Status: DC | PRN
  Administered 2021-04-05: 120 ug via INTRAVENOUS
  Administered 2021-04-05: 80 ug via INTRAVENOUS
  Administered 2021-04-05: 40 ug via INTRAVENOUS

## 2021-04-05 MED ORDER — BUPIVACAINE HCL 0.25 % IJ SOLN
INTRAMUSCULAR | Status: AC
  Filled 2021-04-05: qty 1

## 2021-04-05 MED ORDER — LIDOCAINE 2% (20 MG/ML) 5 ML SYRINGE
INTRAMUSCULAR | Status: AC
  Filled 2021-04-05: qty 5

## 2021-04-05 MED ORDER — ACETAMINOPHEN 325 MG PO TABS: 650.0000 mg | ORAL_TABLET | Freq: Four times a day (QID) | ORAL | Status: DC | PRN

## 2021-04-05 MED ORDER — PHENYLEPHRINE 40 MCG/ML (10ML) SYRINGE FOR IV PUSH (FOR BLOOD PRESSURE SUPPORT)
PREFILLED_SYRINGE | INTRAVENOUS | Status: AC
  Filled 2021-04-05: qty 10

## 2021-04-05 MED ORDER — KETOROLAC TROMETHAMINE 30 MG/ML IJ SOLN
INTRAMUSCULAR | Status: DC | PRN
  Administered 2021-04-05: 30 mg via INTRAVENOUS

## 2021-04-05 MED ORDER — DEXAMETHASONE SODIUM PHOSPHATE 4 MG/ML IJ SOLN
4.0000 mg | INTRAMUSCULAR | Status: AC
  Administered 2021-04-05: 10 mg via INTRAVENOUS

## 2021-04-05 SURGICAL SUPPLY — 29 items
BLADE EXTENDED COATED 6.5IN (ELECTRODE) ×3 IMPLANT
CATH MUSHROOM 22FR (CATHETERS) ×3 IMPLANT
DERMABOND ADVANCED (GAUZE/BANDAGES/DRESSINGS)
DERMABOND ADVANCED .7 DNX12 (GAUZE/BANDAGES/DRESSINGS) IMPLANT
DRSG OPSITE POSTOP 4X10 (GAUZE/BANDAGES/DRESSINGS) IMPLANT
DRSG OPSITE POSTOP 4X6 (GAUZE/BANDAGES/DRESSINGS) IMPLANT
DRSG OPSITE POSTOP 4X8 (GAUZE/BANDAGES/DRESSINGS) IMPLANT
ELECT REM PT RETURN 15FT ADLT (MISCELLANEOUS) ×3 IMPLANT
GLOVE SURG ENC MOIS LTX SZ6 (GLOVE) ×6 IMPLANT
GLOVE SURG ENC MOIS LTX SZ6.5 (GLOVE) ×6 IMPLANT
GOWN STRL REUS W/ TWL LRG LVL3 (GOWN DISPOSABLE) ×3 IMPLANT
GOWN STRL REUS W/TWL LRG LVL3 (GOWN DISPOSABLE) ×6
KIT BASIN OR (CUSTOM PROCEDURE TRAY) ×3 IMPLANT
KIT TURNOVER KIT A (KITS) ×3 IMPLANT
LIGASURE IMPACT 36 18CM CVD LR (INSTRUMENTS) IMPLANT
NEEDLE HYPO 22GX1.5 SAFETY (NEEDLE) ×3 IMPLANT
NS IRRIG 1000ML POUR BTL (IV SOLUTION) ×3 IMPLANT
PACK GENERAL/GYN (CUSTOM PROCEDURE TRAY) ×3 IMPLANT
POUCH UROSTOMY 1PC FLAT (OSTOMY) ×3 IMPLANT
SPONGE T-LAP 18X18 ~~LOC~~+RFID (SPONGE) IMPLANT
SUT ETHILON 2 0 PSLX (SUTURE) ×3 IMPLANT
SUT MNCRL AB 4-0 PS2 18 (SUTURE) ×6 IMPLANT
SUT VIC AB 2-0 SH 27 (SUTURE)
SUT VIC AB 2-0 SH 27X BRD (SUTURE) IMPLANT
SWAB COLLECTION DEVICE MRSA (MISCELLANEOUS) ×3 IMPLANT
SWAB CULTURE ESWAB REG 1ML (MISCELLANEOUS) ×3 IMPLANT
TOWEL OR 17X26 10 PK STRL BLUE (TOWEL DISPOSABLE) ×3 IMPLANT
TOWEL OR NON WOVEN STRL DISP B (DISPOSABLE) ×3 IMPLANT
TRAY FOLEY MTR SLVR 16FR STAT (SET/KITS/TRAYS/PACK) ×3 IMPLANT

## 2021-04-05 NOTE — Anesthesia Postprocedure Evaluation (Signed)
Anesthesia Post Note  Patient: Sarah Newman  Procedure(s) Performed: EXAM UNDER ANESTHESIA,I & D OF ABDOMINAL WOUND WITH PLACEMENT OF MUSHROOM CATHETER     Patient location during evaluation: PACU Anesthesia Type: General Level of consciousness: awake and alert, awake and oriented Pain management: pain level controlled Vital Signs Assessment: post-procedure vital signs reviewed and stable Respiratory status: spontaneous breathing, nonlabored ventilation, respiratory function stable and patient connected to nasal cannula oxygen Cardiovascular status: blood pressure returned to baseline and stable Postop Assessment: no apparent nausea or vomiting Anesthetic complications: no   No notable events documented.  Last Vitals:  Vitals:   04/05/21 1200 04/05/21 1300  BP: 95/68 111/82  Pulse: 68 71  Resp: 16 18  Temp:    SpO2: 92% 96%    Last Pain:  Vitals:   04/05/21 1300  PainSc: 0-No pain                 Catalina Gravel

## 2021-04-05 NOTE — Op Note (Signed)
PATIENT: Sarah Newman DATE: 04/05/21  Preop Diagnosis:  abdominal wall abscess, enterocutaneous fistula  Postoperative Diagnosis: same  Surgery: incision and drainage of abdominal wall abscess, placement of drainage catheter  Surgeons:  Everitt Amber, MD  Assistant: Joylene John, NP  Anesthesia: General   Estimated blood loss: 38m  IVF:  200 ml   Urine output: not recorded  Complications: None   Pathology: none  Specimens: anaerobic and aerobic culture of abdominal wound.  Operative findings: 2 skin openings (one adjacent to umbilicus which was actively draining thin purulent fluid (cultured) and the other in the mid left abdominal wall with a pinpoint drainage opening. Abscess cavity deflated but measuring approximately 8x10cm tracking into left upper quadrant subcutaneous tissues.   Procedure: The patient was identified in the preoperative holding area. Informed consent was signed on the chart. Patient was seen history was reviewed and exam was performed.   The patient was then taken to the operating room and placed in the supine position with SCD hose on. General anesthesia was then induced without difficulty. She was then placed in the supine position. The abdomen was prepped with iodinated sponges per protocol.  The patient was then draped after the prep was dried. Timeout was performed the patient, procedure, antibiotic, allergy, and length of procedure.   Cultures were taken from the fluid emanating from the supraumbilical drainage site.   This drainage site was opened carefully and dilated with a tonsil clamp and the cavity was explored and opened with the jaws of the clamp to break up loculations. The cavity was irrigated with saline.   A 22 french mushroom catheter was introduced into the abscess cavity and sutured to the skin edge with a nylon suture.  It was encapsuled within a ostomy bag appliance.  20 mL of Exparel within 10 mL of marcaine was  injected for postoperative pain control.   All instrument, suture, laparotomy, Ray-Tec, and needle counts were correct x2. The patient tolerated the procedure well and was taken recovery room in stable condition. This is EEveritt Amberdictating an operative note on [name]   EThereasa Solo MD

## 2021-04-05 NOTE — Transfer of Care (Signed)
Immediate Anesthesia Transfer of Care Note  Patient: Sarah Newman  Procedure(s) Performed: EXAM UNDER ANESTHESIA,I & D OF ABDOMINAL WOUND WITH PLACEMENT OF MUSHROOM CATHETER  Patient Location: PACU  Anesthesia Type:General  Level of Consciousness: awake and patient cooperative  Airway & Oxygen Therapy: Patient Spontanous Breathing and Patient connected to face mask oxygen  Post-op Assessment: Report given to RN and Post -op Vital signs reviewed and stable  Post vital signs: Reviewed and stable  Last Vitals:  Vitals Value Taken Time  BP 119/81 04/05/21 1105  Temp    Pulse 88 04/05/21 1107  Resp 18 04/05/21 1107  SpO2 100 % 04/05/21 1107  Vitals shown include unvalidated device data.  Last Pain:  Vitals:   04/05/21 0859  PainSc: 8       Patients Stated Pain Goal: 2 [id number])  Complications: No notable events documented.

## 2021-04-05 NOTE — Anesthesia Preprocedure Evaluation (Addendum)
Anesthesia Evaluation  Patient identified by MRN, date of birth, ID band Patient awake    Reviewed: Allergy & Precautions, NPO status , Patient's Chart, lab work & pertinent test results  Airway Mallampati: II  TM Distance: >3 FB Neck ROM: Full    Dental  (+) Teeth Intact, Dental Advisory Given, Caps,    Pulmonary asthma ,    Pulmonary exam normal breath sounds clear to auscultation       Cardiovascular negative cardio ROS Normal cardiovascular exam Rhythm:Regular Rate:Normal     Neuro/Psych negative neurological ROS  negative psych ROS   GI/Hepatic Neg liver ROS, GERD  Medicated,  Endo/Other  Obesity   Renal/GU negative Renal ROS     Musculoskeletal  (+) Arthritis ,   Abdominal   Peds  Hematology  (+) Blood dyscrasia, anemia ,   Anesthesia Other Findings Day of surgery medications reviewed with the patient.  Reproductive/Obstetrics Malignant neoplasm of left ovary                            Anesthesia Physical Anesthesia Plan  ASA: 3  Anesthesia Plan: General   Post-op Pain Management:    Induction: Intravenous  PONV Risk Score and Plan: 4 or greater and Midazolam, Dexamethasone and Ondansetron  Airway Management Planned: Oral ETT  Additional Equipment:   Intra-op Plan:   Post-operative Plan: Extubation in OR  Informed Consent: I have reviewed the patients History and Physical, chart, labs and discussed the procedure including the risks, benefits and alternatives for the proposed anesthesia with the patient or authorized representative who has indicated his/her understanding and acceptance.     Dental advisory given  Plan Discussed with: CRNA  Anesthesia Plan Comments:         Anesthesia Quick Evaluation

## 2021-04-05 NOTE — Consult Note (Signed)
WOC Nurse Consult Note: Patient receiving care in [facility] PACU 11.  Primary RN at bedside. Reason for Consult: "For evaluation of fistula site now with mushroom catheter in place, need recommendation for appliance Plan for possible discharge later today or in the am" Wound type: surgically created abdominal opening with a cathter stitched to the skin.  Currently with a one piece urinary catheter in place with the mushroom catheter in the pouch. Drops of serosanginous drainage observed in the pouch. Pressure Injury POA: Yes/No/NA Measurement: Wound bed: Drainage (amount, consistency, odor)  Periwound: Dressing procedure/placement/frequency:  For the purpose of pouching the EC fistula on the abdomen, use fecal pouch Kellie Simmering [phone]).  Cut the opening large enough to thread the mushroom catheter into the pouch.  Provide the patient with at least 5 pouches when she is discharged.  The pouch should be changed if leakage occurs.  [place] nurse will not follow at this time.  Please re-consult the [place] team if needed.  Val Riles, RN, MSN, CWOCN, CNS-BC, pager [phone]

## 2021-04-05 NOTE — H&P (Signed)
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  Reported closure of cutaneous drainage site and increasing redness, mass, warmth and pain in abdominal wall above this drainage site for 4 preceding days.  CT imaging demonstrated collection of gas and fluid above the drainage site.  O: LMP 07/05/2019  afebrile, not tachy.  Abdomen revealed a 6cm soft area of fullness in left upper abdomen with overlying mild blanching erythema consistent with an undrained collection of stool/abscess.  In no acute distress.  A&P:  Closure of cutaneous aspect of EC fistula with acculumation of stool in subcutaneous space and associated cellulitis.  Recommend drainage of the collection and dilation of the cutaneous fistulous tract.  Will place mushroom catheter or red rubber catheter within the fistula to keep cutaneous aspect open.  Discussed procedure with patient and expected postoperative course.   Thereasa Solo, MD

## 2021-04-05 NOTE — Anesthesia Procedure Notes (Signed)
Procedure Name: Intubation Date/Time: 04/05/2021 10:14 AM Performed by: Lyndal Pulley, CRNA Pre-anesthesia Checklist: Patient identified, Emergency Drugs available, Suction available and Patient being monitored Patient Re-evaluated:Patient Re-evaluated prior to induction Oxygen Delivery Method: Circle system utilized Preoxygenation: Pre-oxygenation with 100% oxygen Induction Type: IV induction Ventilation: Mask ventilation without difficulty Laryngoscope Size: Mac and 3 Grade View: Grade I Tube type: Oral Tube size: 7.0 mm Number of attempts: 1 Airway Equipment and Method: Stylet and Oral airway Placement Confirmation: ETT inserted through vocal cords under direct vision, positive ETCO2 and breath sounds checked- equal and bilateral Secured at: 23 cm Tube secured with: Tape Dental Injury: Teeth and Oropharynx as per pre-operative assessment

## 2021-04-06 DIAGNOSIS — L02211 Cutaneous abscess of abdominal wall: Secondary | ICD-10-CM | POA: Diagnosis not present

## 2021-04-06 NOTE — Discharge Summary (Signed)
Physician Discharge Summary  Patient ID: Sarah Newman MRN: [id number] DOB/AGE: 10-17-1975 45 y.o.  Admit date: 04/05/2021 Discharge date: 04/06/2021  Admission Diagnoses: Enterocutaneous fistula  Discharge Diagnoses:  Principal Problem:   Enterocutaneous fistula Active Problems:   Cellulitis and abscess of trunk   Abdominal fluid collection   Discharged Condition: good  Hospital Course: On 04/05/2021, the patient underwent the following: Procedure(s): EXAM UNDER ANESTHESIA,I & D OF ABDOMINAL WOUND WITH PLACEMENT OF MUSHROOM CATHETER.   The postoperative course was uneventful.  She was discharged to home on postoperative day 1 tolerating a regular diet.   Consults: None  Significant Diagnostic Studies: radiology: CT scan: abdomen  Treatments: surgery: see above  Discharge Exam: Blood pressure 109/76, pulse (!) 57, temperature (!) 97.5 F (36.4 C), resp. rate 17, height '5\' 2"'$  (1.575 m), weight 83.9 kg, last menstrual period 07/05/2019, SpO2 100 %. General appearance: alert and no distress GI: soft, NT, ND.  Brown, liquid stool in the ostomy bag Extremities: extremities normal, atraumatic, no cyanosis or edema  Disposition: Discharge disposition: 01-Home or Self Care       Discharge Instructions     Activity as tolerated - No restrictions   Complete by: As directed    Call MD for:  extreme fatigue   Complete by: As directed    Call MD for:  persistant dizziness or light-headedness   Complete by: As directed    Call MD for:  persistant nausea and vomiting   Complete by: As directed    Call MD for:  redness, tenderness, or signs of infection (pain, swelling, redness, odor or green/yellow discharge around incision site)   Complete by: As directed    Call MD for:  severe uncontrolled pain   Complete by: As directed    Call MD for:  temperature >100.4   Complete by: As directed    Diet general   Complete by: As directed    No dressing needed   Complete by: As  directed       Allergies as of 04/06/2021       Reactions   Carboplatin Shortness Of Breath        Medication List     TAKE these medications    Accu-Chek Softclix Lancets lancets Use as instructed   acetaminophen 325 MG tablet Commonly known as: TYLENOL Take 650 mg by mouth every 6 (six) hours as needed for mild pain, fever or headache.   acetaminophen 650 MG suppository Commonly known as: TYLENOL Place 1 suppository (650 mg total) rectally every 6 (six) hours as needed for moderate pain.   amoxicillin-clavulanate 875-125 MG tablet Commonly known as: AUGMENTIN Take 1 tablet by mouth 2 (two) times daily.   fluconazole 200 MG tablet Commonly known as: DIFLUCAN Take 2 tablets by mouth daily.   glucose blood test strip Use as instructed   HYDROmorphone HCl 1 MG/ML Liqd Commonly known as: DILAUDID Take 1 mL (1 mg total) by mouth every 4 (four) hours as needed for severe pain or moderate pain.   lidocaine-prilocaine cream Commonly known as: EMLA APPLY TO THE AFFECTED AREA AS DIRECTED TO ACCESS PORT   linezolid 600 MG tablet Commonly known as: ZYVOX Take 1 tablet  by mouth every 12  hours.   LORazepam 0.5 MG tablet Commonly known as: ATIVAN Take 1 tablet by mouth every 8 hours as needed for anxiety/insomnia. Do not take and drive.   multivitamin with minerals Tabs tablet Take 1 tablet by mouth daily.   omeprazole 40  MG capsule Commonly known as: PRILOSEC Take 1 capsule (40 mg total) by mouth daily.   ondansetron 4 MG disintegrating tablet Commonly known as: Zofran ODT Take 1 tablet (4 mg total) by mouth every 4 (four) hours as needed for up to 20 doses for nausea or vomiting.   promethazine 12.5 MG suppository Commonly known as: PHENERGAN Place 1 suppository (12.5 mg total) rectally every 8 (eight) hours as needed for up to 30 doses for nausea or vomiting.               Discharge Care Instructions  (From admission, onward)           Start      Ordered   04/06/21 0000  No dressing needed        04/06/21 1209            Follow-up Information     Lafonda Mosses, MD Follow up on 04/10/2021.   Specialty: Gynecologic Oncology Why: at 1pm at the [facility] information: [address] [place] [id number] [phone]                 Signed: Lahoma Crocker 04/06/2021, 12:10 PM

## 2021-04-09 ENCOUNTER — Telehealth: Payer: Self-pay

## 2021-04-09 ENCOUNTER — Other Ambulatory Visit (HOSPITAL_COMMUNITY): Payer: Self-pay

## 2021-04-09 ENCOUNTER — Other Ambulatory Visit: Payer: Self-pay | Admitting: Gynecologic Oncology

## 2021-04-09 DIAGNOSIS — R1010 Upper abdominal pain, unspecified: Secondary | ICD-10-CM

## 2021-04-09 DIAGNOSIS — K632 Fistula of intestine: Secondary | ICD-10-CM

## 2021-04-09 MED ORDER — OXYCODONE HCL 5 MG PO TABS
5.0000 mg | ORAL_TABLET | ORAL | 0 refills | Status: DC | PRN
  Filled 2021-04-09: qty 30, 5d supply, fill #0

## 2021-04-09 NOTE — Telephone Encounter (Signed)
Spoke with Ms. Carstens this morning. She states she is eating, drinking and urinating well. She has not had a BM yet but is passing gas. She denies fever or chills. Drain is in place with ostomy pouch over it. Pt. Reports 28m of brown liquid drainage daily. Pain was controlled with the exparel after surgery but this is starting to wear off. Patient requesting prescription for oxycodone, MJoylene John NP notified.   Instructed to call office with any fever, chills, purulent drainage, uncontrolled pain or any other questions or concerns. Patient verbalizes understanding.   Pt aware of post op appointments as well as the office number [phone] after hours number [phone] call if she has any questions or concerns

## 2021-04-09 NOTE — Progress Notes (Signed)
See RN note. Patient requesting refill on pain medications. S/P abdominal fluid collection exploration and drainage with a mushroom catheter left in place.

## 2021-04-10 ENCOUNTER — Other Ambulatory Visit (HOSPITAL_COMMUNITY): Payer: Self-pay

## 2021-04-10 ENCOUNTER — Other Ambulatory Visit: Payer: Self-pay

## 2021-04-10 ENCOUNTER — Encounter: Payer: Self-pay | Admitting: Gynecologic Oncology

## 2021-04-10 ENCOUNTER — Inpatient Hospital Stay: Payer: Medicaid Other | Attending: Gynecologic Oncology | Admitting: Gynecologic Oncology

## 2021-04-10 VITALS — BP 117/81 | HR 78 | Temp 98.5°F | Resp 16 | Ht 62.0 in | Wt 183.8 lb

## 2021-04-10 DIAGNOSIS — L0291 Cutaneous abscess, unspecified: Secondary | ICD-10-CM

## 2021-04-10 DIAGNOSIS — Z9889 Other specified postprocedural states: Secondary | ICD-10-CM | POA: Insufficient documentation

## 2021-04-10 DIAGNOSIS — K632 Fistula of intestine: Secondary | ICD-10-CM

## 2021-04-10 DIAGNOSIS — C563 Malignant neoplasm of bilateral ovaries: Secondary | ICD-10-CM

## 2021-04-10 LAB — CULTURE, BLOOD (SINGLE)
MICRO NUMBER:: 12323441
MICRO NUMBER:: 12323440
Result:: NO GROWTH
SPECIMEN QUALITY:: ADEQUATE

## 2021-04-10 LAB — AEROBIC/ANAEROBIC CULTURE W GRAM STAIN (SURGICAL/DEEP WOUND)

## 2021-04-10 MED ORDER — CEPHALEXIN 500 MG PO CAPS
500.0000 mg | ORAL_CAPSULE | Freq: Four times a day (QID) | ORAL | 0 refills | Status: DC
  Filled 2021-04-10 – 2021-04-26 (×2): qty 40, 10d supply, fill #0

## 2021-04-10 MED ORDER — OSTOMY SUPPLIES POUCH MISC
1.0000 | 0 refills | Status: DC | PRN
  Filled 2021-04-10: qty 15, fill #0

## 2021-04-10 NOTE — Progress Notes (Signed)
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 There is a 16 cm complex cystic mass in the pelvis containing thickened septations located near midline, abutting the superior aspect of the uterus. I suspect this mass is adnexal/ovarian in origin rather than uterine in origin. Specifically, I suspect the mass likely arises from the left ovary/adnexa and is most consistent with a neoplasm. Malignancy is certainly not excluded on this study. Recommend a pelvic ultrasound for further evaluation. 2. The rounded more solid-appearing structure in the right side of the pelvis is probably the right ovary. Recommend attention on ultrasound. 3. The small amount of fluid in the pelvis is likely reactive to the complex cystic mass likely rising from the left ovary/adnexa. 4. No cause for blood in vomit or black stools identified. No convincing evidence of a perforated or inflamed gastric or duodenal ulcer. 5. Hepatic steatosis.   07/02/2019 Imaging   MRI pelvis 1. 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 There are 2 enhancing nodules within the central upper abdomen raising the possibility of peritoneal carcinomatosis. Additionally, there is a small amount fluid within the pelvis. Malignant ascites not excluded. 2. Fibroid uterus. 3. Hepatic steatosis.   07/02/2019 Imaging   US pelvis 1. There is a large mass in the pelvis also seen on CT imaging. A separate left ovary is not visualized. 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 The mass is suspicious for malignancy. Recommend gynecologic consultation. 2. The right ovary  demonstrates an irregular ill-defined hypoechoic hypervascular region centrally. The findings are nonspecific in the right ovary. However, given the apparent left ovarian neoplasm suspicious for malignancy, recommend an MRI to further assess the right ovary. 3. Uterine fibroid measuring 3.6 cm.     07/21/2019 Pathology Results   FINAL MICROSCOPIC DIAGNOSIS: A. 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 - See oncology table. B. OMENTUM, RESECTION: - Omental lymph node with metastatic carcinoma (1/1). - Omental adipose tissue with foci of inflammation and reactive mesothelial changes. C. FALLOPIAN TUBE, LEFT, RESECTION: - Fallopian tube with focal, mild inflammation and fibrosis. - No tumor identified. D. UTERUS, CERVIX, RIGHT TUBE AND OVARY: - Cervix Nabothian cyst and squamous metaplasia. - Endometrium Proliferative. No hyperplasia or carcinoma. - Myometrium Leiomyomata. No malignancy. -Right ovary Poorly differentiated carcinoma, 4.8 cm. See oncology table and comment. -Right Fallopian tube Benign paratubal cyst. No endometriosis or malignancy. ONCOLOGY TABLE: OVARY or FALLOPIAN TUBE or PRIMARY PERITONEUM: Procedure: Bilateral f-oophorectomy, hysterectomy and omentectomy. Specimen Integrity: Intact Tumor Site: Left ovary and right ovary. See comment. Ovarian Surface Involvement: Not identified. Fallopian Tube Surface Involvement: Not identified. Tumor Size: Left ovary: 18.6 x 16.0 x 7.2 cm. Right ovary: 4.8 x 3.2 x 3.2 cm. Histologic Type: Clear cell carcinoma. See comment. Histologic Grade: High-grade. Other Tissue/ Organ Involvement: Omental lymph node with metastatic carcinoma. Peritoneal/Ascitic Fluid: Pending. Treatment Effect: No known presurgical therapy. Pathologic Stage Classification (pTNM, AJCC 8th Edition): pT3a, pN1a. Representative Tumor Block: A2, A3, A4, A5 A6, A7 and A8. Comment(s): The left ovarian tumor is 18.6 cm  in greatest dimension and has features of clear cell carcinoma. The right  ovarian tumor is 4.8 cm in greatest dimension and is poorly differentiated with focal features consistent with clear cell carcinoma. The pattern of involvement suggests that the right ovarian tumor is likely metastatic from the larger left ovarian clear cell carcinoma. Sections of the omentum show a lymph node with metastatic carcinoma. Sections of the remainder of the omentum do not show involvement by carcinoma.   07/21/2019 Surgery   Procedure(s) Performed: Exploratory laparotomy with radical tumor debulking including total hysterectomy, bilateral salpingo-oophorectomy, infra-gastric omentectomy, and washings.   Surgeon: Jeral Pinch, MD    Operative Findings: On EUA, mobile uterus and ovarian mass, situated out of the pelvis, spanning 6-8 cm above the umbilicus.  On intra-abdominal entry, inflamed omentum adherent to much of the 16 cm left ovarian mass, adhesions easily lysed bluntly.  No nodularity of the ovarian mass however on delivery of the mass through the midline incision, there was Intra-Op rupture of one of the smaller cystic components with drainage of clear brown-tinged fluid.  Grossly normal-appearing left fallopian tube as well as right tube.  Right ovary mildly enlarged measuring approximately 4 cm and firm/nodular, suspicious for tumor involvement.  Uterus 8-10 cm with 4 cm anterior mid body fibroid.  Some very small volume miliary disease along right pelvic sidewall, removed with uterine specimen.  Evidence of small diverticular disease.  Small bowel normal appearing although multiple loops of bowel adherent to each other, especially by mesentery, and an inflammatory manner.  An approximately 2 x 2 centimeter nodule suspicious for tumor implant was found in the omentum just inferior to the greater curvature of the stomach.  Additional small (less than 5 mm) implants noted on the stomach and posterior aspect of  the left lobe of the liver.  Otherwise the liver surface and diaphragm were smooth.   08/03/2019 Cancer Staging   Staging form: Ovary, Fallopian Tube, and Primary Peritoneal Carcinoma, AJCC 8th Edition - Pathologic: Stage IIIA2 (pT3a, pN1, cM0) - Signed by Heath Lark, MD on 08/03/2019   08/12/2019 Procedure   Placement of single lumen port a cath via right internal jugular vein. The catheter tip lies at the cavo-atrial junction. A power injectable port a cath was placed and is ready for immediate use.   08/22/2019 -  Chemotherapy   The patient had carboplatin and taxol for chemotherapy treatment.     09/23/2019 Genetic Testing   Negative genetic testing:  No pathogenic variants detected on the [facility] TumorNext-HRD+CancerNext paired germline and somatic genetic test. The report date is 09/23/2019.  The TumorNext-HRD+CancerNext test offered by [facility] includes paired germline and tumor analyses of 11 genes associated with homologous recombination repair (ATM, BARD1, BRIP1, CHEK2, MRE11A, NBN, PALB2, RAD51C, RAD51D, BRCA1, BRCA2) plus germline analyses of 26 additional genes associated with hereditary cancer (APC, AXIN2, BMPR1A, CDH1, CDK4, CDKN2A, DICER1, HOXB13, EPCAM, GREM1, MLH1, MSH2, MSH3, MSH6, MUTYH, NF1, NTHL1, PMS2, POLD1, POLE, PTEN, RECQL, SMAD4, SMARCA4, STK11, and TP53).   11/14/2019 Tumor Marker   Patient's tumor was tested for the following markers: CA-125 Results of the tumor marker test revealed 10.2.   12/05/2019 Tumor Marker   Patient's tumor was tested for the following markers: CA-125 Results of the tumor marker test revealed 9.9   01/05/2020 Imaging   No specific findings of active malignancy. Interval resection of the pelvic mass. Subtle nodularity along the right side of the vaginal cuff merits surveillance.     01/05/2020 Tumor Marker   Patient's tumor was tested for the following markers: CA-125 Results of the  tumor marker test revealed 9.7   02/17/2020 Tumor Marker    Patient's tumor was tested for the following markers: CA-125 Results of the tumor marker test revealed 9.6   04/05/2020 Imaging   1. There is some minimal stranding along the mesentery and omentum without overt omental caking or well-defined nodularity. Faint bandlike density along the anterior peritoneal reflection in the pelvis, slightly more notable than on prior. Overall the appearance is not considered specific for recurrence but given the slight increase in prominence from 01/05/2020, would encourage careful surveillance by tumor markers and imaging. 2. Mild lower lumbar degenerative facet arthropathy.   04/05/2020 Tumor Marker   Patient's tumor was tested for the following markers: CA-125 Results of the tumor marker test revealed 11   05/29/2020 Tumor Marker   Patient's tumor was tested for the following markers: CA-125. Results of the tumor marker test revealed 17.   07/04/2020 Tumor Marker   Patient's tumor was tested for the following markers: CA-125. Results of the tumor marker test revealed 20.1   07/16/2020 Imaging   Stable mild stranding throughout mesenteric and omental fat, without evidence of discrete masses or ascites. No evidence of new or progressive disease.   08/29/2020 Tumor Marker   Patient's tumor was tested for the following markers: CA-125 Results of the tumor marker test revealed 32.7.   09/07/2020 Imaging   1. Interval progression of peritoneal disease within the abdomen and pelvis. Tumor predominantly involves the serosal surface of the large and small bowel loops. No signs of bowel obstruction identified at this time. 2. No evidence for metastatic disease to the chest.   09/19/2020 Imaging   1. No acute findings identified within the abdomen or pelvis. No evidence for bowel obstruction. 2. Similar appearance of soft tissue thickening along the peritoneal reflections within the abdomen and pelvis compatible with known peritoneal disease. Recently characterized  increased soft tissue along the surface of the jejunal bowel loops and tethering of pelvic small bowel loops is difficult to quantify (and compared with previous exam) due to lack of IV contrast material.     09/20/2020 -  Chemotherapy    Patient is on Treatment Plan: OVARIAN RECURRENT 3RD LINE CARBOPLATIN D1 / GEMCITABINE D1,8 (4/800) Q21D  Carboplatin was discontinued after 10/15/2020 due to allergic reaction      10/01/2020 Tumor Marker   Patient's tumor was tested for the following markers: CA-125. Results of the tumor marker test revealed 37.2.   10/15/2020 Tumor Marker   Patient's tumor was tested for the following markers: CA-125. Results of the tumor marker test revealed 48   10/22/2020 Tumor Marker   Patient's tumor was tested for the following markers: CA-125 Results of the tumor marker test revealed 28.5   11/05/2020 Tumor Marker   Patient's tumor was tested for the following markers: CA-125 Results of the tumor marker test revealed 18.9   12/17/2020 Imaging   1. No significant change in soft tissue thickening and nodularity in the low pelvis, with minimal thickening of the peritoneum throughout the abdomen. 2. Similar appearance of soft tissue thickening about the mid small bowel in the central, ventral abdomen. 3. Findings are consistent with stable peritoneal metastatic disease. 4. Status post hysterectomy and cholecystectomy.   12/18/2020 Tumor Marker   Patient's tumor was tested for the following markers: CA-125 Results of the tumor marker test revealed 10.8   01/11/2021 Imaging   Findings concerning for small bowel obstruction.     01/11/2021 Tumor Marker   Patient's tumor  was tested for the following markers: CA-125. Results of the tumor marker test revealed 10.4.   01/11/2021 Imaging   CT abdomen and pelvis Mildly prominent proximal loops of small bowel although no true transition zone is seen in the distal small bowel appears within normal limits. These changes  could represent early partial small bowel obstruction. The overall appearance however is similar to that seen on prior CT from May of 2022.   Mild peritoneal thickening similar to that seen on the prior exam.   Fatty liver.   No other focal abnormality is noted.     Interval History: On Friday, she underwent I&D of abdominal wall abscess due to succus collection in the subcutaneous tissue in the setting of an EC fistula. Was discharged on Saturday. Has been feeling well. Was able to travel to [place] to see family over the weekend.  Reports drainage is now green/brown again. Has been 25-30cc the last few days. She is still has some tenderness above the incision (where mushroom drain in), less than prior to Friday. Had some trouble with leaking from ostomy bag, is using her last bag currently. Was given a prescription for amoxicillin, hasn't started it yet.  She endorses formed bowel movements. Denies any bladder symptoms. Still has some anxiety about PO intact, denies any nausea or emesis.   Past Medical/Surgical History: Past Medical History:  Diagnosis Date   Anemia due to antineoplastic chemotherapy    Arthritis    Asthma due to seasonal allergies    01-21-2021 per pt seasonal and exercised induced, does not have inhaler, last used one few years   Chronic abdominal pain 01/2021   Chronic nausea    01-21-2021  per pt intermittant nausea and when has abd pain most of time will have vomiting   Family history of breast cancer    Family history of colon cancer    Family history of ovarian cancer    Family history of prostate cancer    History of 2019 novel coronavirus disease (COVID-19) 09/08/2020   positive results in epic, per pt mild symptoms that resolved   History of Helicobacter pylori infection 07/2016   E[facility] w/ bx's by [facility],  chronic h.pylori w/ gastritis,  treated  (no ulcer)   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;  progression w/ abdominal carcinomatosis   On total parenteral nutrition (TPN) 02/02/2021   infused thru [facility] in place    Steroid-induced diabetes ([place])    followed by dr Alvy Bimler    Past Surgical History:  Procedure Laterality Date   CHOLECYSTECTOMY N/A 02/16/2015   Procedure: LAPAROSCOPIC CHOLECYSTECTOMY WITH INTRAOPERATIVE CHOLANGIOGRAM;  Surgeon: Johnathan Hausen, MD;  Location: [facility];  Service: General;  Laterality: N/A;   INCISION AND DRAINAGE ABSCESS N/A 02/02/2021   Procedure: INCISION AND DRAINAGE ABSCESS;  Surgeon: Lahoma Crocker, MD;  Location: [facility];  Service: Gynecology;  Laterality: N/A;   IR IMAGING GUIDED PORT INSERTION  08/12/2019   IR REMOVAL TUN ACCESS W/ PORT W/O FL MOD SED  03/18/2021   LAPAROSCOPY N/A 01/23/2021   Procedure: LAPAROSCOPY DIAGNOSTIC;  Surgeon: Lafonda Mosses, MD;  Location: [facility];  Service: Gynecology;  Laterality: N/A;   LAPAROTOMY N/A 01/23/2021   Procedure: LAPAROTOMY, ENTEROTOMY REPAIR OF THE BOWEL;  Surgeon: Lafonda Mosses, MD;  Location: [facility];  Service: Gynecology;  Laterality: N/A;   SALPINGOOPHORECTOMY N/A 07/21/2019   Procedure: EXPLORATORY LAPAROTOMY, TOTAL ABDOMINAL HYSTERECTOMY, BILATERAL SALPINGO OOPHORECTOMY, OMENTECTOMY;  Surgeon: Lafonda Mosses, MD;  Location: [facility];  Service: Gynecology;  Laterality: N/A;   UPPER GASTROINTESTINAL ENDOSCOPY  08/01/2016   by dr Corinda Gubler in [place] N/A 03/11/2021   Procedure: OPENING AND EXPLORATION OF ABDOMINAL EXCISION;  Surgeon: Lafonda Mosses, MD;  Location: [facility];  Service: Gynecology;  Laterality: N/A;    Family History  Problem Relation Age of Onset   Colon cancer Father 53   Prostate cancer Father 70   Colon cancer Paternal Aunt        dx. early 39s   Ovarian cancer Paternal Grandmother 48    Breast cancer Cousin    Endometriosis Mother    Cancer Paternal Uncle        unknown type, dx. early 48s   Cancer Paternal Uncle 18       unknown type   Esophageal cancer Neg Hx     Social History   Socioeconomic History   Marital status: Married    Spouse name: Herbie [place]   Number of children: 2   Years of education: Not on file   Highest education level: Not on file  Occupational History   Not on file  Tobacco Use   Smoking status: Never   Smokeless tobacco: Never  Vaping Use   Vaping Use: Never used  Substance and Sexual Activity   Alcohol use: Yes    Comment: Social drinker   Drug use: Never   Sexual activity: Yes    Birth control/protection: Surgical  Other Topics Concern   Not on file  Social History Narrative   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  Newly married as of October 2020.   2 boys, age 8 and 22   Social Determinants of Health   Emergency planning/management officer Strain: Not on file  Food Insecurity: Not on file  Transportation Needs: Not on file  Physical Activity: Not on file  Stress: Not on file  Social Connections: Not on file    Current Medications:  Current Outpatient Medications:    Accu-Chek Softclix Lancets lancets, Use as instructed, Disp: 100 each, Rfl: 12   acetaminophen (TYLENOL) 325 MG tablet, Take 650 mg by mouth every 6 (six) hours as needed for mild pain, fever or headache., Disp: , Rfl:    acetaminophen (TYLENOL) 650 MG suppository, Place 1 suppository (650 mg total) rectally every 6 (six) hours as needed for moderate pain., Disp: 15 suppository, Rfl: 2   cephALEXin (KEFLEX) 500 MG capsule, Take 1 capsule (500 mg total) by mouth 4 (four) times daily for 10 days., Disp: 40 capsule, Rfl: 0   fluconazole (DIFLUCAN) 200 MG tablet, Take 2 tablets by mouth daily., Disp: 10 tablet, Rfl: 0   glucose blood test strip, Use as instructed, Disp: 100 each, Rfl: 12   LORazepam (ATIVAN) 0.5 MG tablet, Take 1 tablet by mouth every 8 hours as needed for  anxiety/insomnia. Do not take and drive., Disp: 15 tablet, Rfl: 0   Multiple Vitamin (MULTIVITAMIN WITH MINERALS) TABS tablet, Take 1 tablet by mouth daily., Disp: , Rfl:    omeprazole (PRILOSEC) 40 MG capsule, Take 1 capsule (40 mg total) by mouth daily., Disp: 30 capsule, Rfl: 3   ondansetron (ZOFRAN ODT) 4 MG disintegrating tablet, Take 1 tablet (4 mg total) by mouth every 4 (four) hours as needed for up to 20  doses for nausea or vomiting., Disp: 20 tablet, Rfl: 0   Ostomy Supplies Pouch MISC, 1 each by Does not apply route as needed Kellie Simmering [phone]). Kellie Simmering [phone], Disp: 15 Bag, Rfl: 0   oxyCODONE (OXY IR/ROXICODONE) 5 MG immediate release tablet, Take 1 tablet (5 mg total) by mouth every 4 (four) hours as needed for severe pain. Do not take and drive, Disp: 30 tablet, Rfl: 0   promethazine (PHENERGAN) 12.5 MG suppository, Place 1 suppository (12.5 mg total) rectally every 8 (eight) hours as needed for up to 30 doses for nausea or vomiting., Disp: 30 each, Rfl: 0   lidocaine-prilocaine (EMLA) cream, APPLY TO THE AFFECTED AREA AS DIRECTED TO ACCESS PORT (Patient not taking: Reported on 04/10/2021), Disp: 30 g, Rfl: 11  Review of Systems: Denies appetite changes, fevers, chills, fatigue, unexplained weight changes. Denies hearing loss, neck lumps or masses, mouth sores, ringing in ears or voice changes. Denies cough or wheezing.  Denies shortness of breath. Denies chest pain or palpitations. Denies leg swelling. Denies abdominal distention, pain, blood in stools, constipation, diarrhea, nausea, vomiting, or early satiety. Denies pain with intercourse, dysuria, frequency, hematuria or incontinence. Denies hot flashes, pelvic pain, vaginal bleeding or vaginal discharge.   Denies joint pain, back pain or muscle pain/cramps. Denies itching, rash, or wounds. Denies dizziness, headaches, numbness or seizures. Denies swollen lymph nodes or glands, denies easy bruising or bleeding. Denies anxiety,  depression, confusion, or decreased concentration.  Physical Exam: BP 117/81 (BP Location: Left Arm, Patient Position: Sitting)   Pulse 78   Temp 98.5 F (36.9 C) (Oral)   Resp 16   Ht $R'5\' 2"'mp$  (1.575 m)   Wt 183 lb 12.8 oz (83.4 kg)   LMP 07/05/2019   SpO2 100%   BMI 33.62 kg/m  General: Alert, oriented, no acute distress. HEENT: Normocephalic, atraumatic, sclera anicteric. Chest: Unlabored breathing on room air Abdomen: Obese, soft, Normoactive bowel sounds.  Some mild tenderness around upper aspect of incision, fullness appreciated at location of mushroom drain. Yellowish drainage noted in ostomy bag.  Extremities: Grossly normal range of motion.  Warm, well perfused.  No edema bilaterally.  Laboratory & Radiologic Studies: Culture MODERATE ESCHERICHIA COLI  FEW KLEBSIELLA PNEUMONIAE  NO ANAEROBES ISOLATED  Performed at [facility], [place] [address], [place],  [address]    Assessment & Plan: Sarah Newman is a 45 y.o. woman with history of ovarian cancer who has an EC fistula after diagnostic surgery converted to open surgery complicated by bowel injury and repair in the setting of recent avastin now with decreasing output but who has had closure of skin incision multiple times leading to subcutaneous collection of succus resulting in abscess formation.   Last went to OR on Friday. Doing well post-op. Mushroom drain in place, hopefully will help maintain opening to allow for fistula drainage. Output continues to be low even after patient started taking PO intake. Will plan to treat with 10 days of Keflex - script sent in today. I will see the patient in 2 weeks for follow-up. I did not remove ostomy bag as this is her last one. I have sent a script in for new bag (was previously using [place] pouch) and will plan to remove pouch to inspect incision at next visit.   32 minutes of total time was spent for this patient encounter, including preparation, face-to-face  counseling with the patient and coordination of care, and documentation of the encounter.  Jeral Pinch, MD  Division of  Gynecologic Oncology  Department of Obstetrics and Gynecology  [facility]

## 2021-04-10 NOTE — Patient Instructions (Signed)
Its good to see you. I will see you in 2 weeks and plan to bring an ostomy bag so that I can see the incision and we will change it at that visit. Continue to keep tract of the output from your fistula.

## 2021-04-11 ENCOUNTER — Other Ambulatory Visit (HOSPITAL_COMMUNITY): Payer: Self-pay

## 2021-04-11 ENCOUNTER — Encounter: Payer: Self-pay | Admitting: Gynecologic Oncology

## 2021-04-18 ENCOUNTER — Other Ambulatory Visit (HOSPITAL_COMMUNITY): Payer: Self-pay

## 2021-04-24 ENCOUNTER — Encounter: Payer: Self-pay | Admitting: Gynecologic Oncology

## 2021-04-26 ENCOUNTER — Inpatient Hospital Stay (HOSPITAL_BASED_OUTPATIENT_CLINIC_OR_DEPARTMENT_OTHER): Payer: Medicaid Other | Admitting: Gynecologic Oncology

## 2021-04-26 ENCOUNTER — Other Ambulatory Visit (HOSPITAL_COMMUNITY): Payer: Self-pay

## 2021-04-26 ENCOUNTER — Encounter: Payer: Self-pay | Admitting: Gynecologic Oncology

## 2021-04-26 ENCOUNTER — Other Ambulatory Visit: Payer: Self-pay

## 2021-04-26 VITALS — BP 120/87 | HR 80 | Temp 98.1°F | Resp 16 | Ht 62.0 in | Wt 176.2 lb

## 2021-04-26 DIAGNOSIS — C563 Malignant neoplasm of bilateral ovaries: Secondary | ICD-10-CM | POA: Diagnosis not present

## 2021-04-26 DIAGNOSIS — Z9889 Other specified postprocedural states: Secondary | ICD-10-CM | POA: Diagnosis not present

## 2021-04-26 DIAGNOSIS — K632 Fistula of intestine: Secondary | ICD-10-CM | POA: Diagnosis not present

## 2021-04-26 NOTE — Patient Instructions (Signed)
Great to see you!  I am really happy to hear that you are having very little if any drainage.  Please keep track of what if any drainage you have from the original fistula site.  I also like you to finish the antibiotics that you had to stop.  We will plan to get a CT scan in about a week and a half and if it looks like there is no evidence of a fistula, we will set a date for surgery to get the catheter out.

## 2021-04-26 NOTE — Progress Notes (Signed)
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 There is a 16 cm complex cystic mass in the pelvis containing thickened septations located near midline, abutting the superior aspect of the uterus. I suspect this mass is adnexal/ovarian in origin rather than uterine in origin. Specifically, I suspect the mass likely arises from the left ovary/adnexa and is most consistent with a neoplasm. Malignancy is certainly not excluded on this study. Recommend a pelvic ultrasound for further evaluation. 2. The rounded more solid-appearing structure in the right side of the pelvis is probably the right ovary. Recommend attention on ultrasound. 3. The small amount of fluid in the pelvis is likely reactive to the complex cystic mass likely rising from the left ovary/adnexa. 4. No cause for blood in vomit or black stools identified. No convincing evidence of a perforated or inflamed gastric or duodenal ulcer. 5. Hepatic steatosis.   07/02/2019 Imaging   MRI pelvis 1. 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 There are 2 enhancing nodules within the central upper abdomen raising the possibility of peritoneal carcinomatosis. Additionally, there is a small amount fluid within the pelvis. Malignant ascites not excluded. 2. Fibroid uterus. 3. Hepatic steatosis.   07/02/2019 Imaging   US pelvis 1. There is a large mass in the pelvis also seen on CT imaging. A separate left ovary is not visualized. I suspect the large mass represents a large  neoplasm arising from the left ovary. The mass is suspicious for malignancy. Recommend gynecologic consultation. 2. The right ovary demonstrates an irregular ill-defined hypoechoic hypervascular region centrally. The findings are nonspecific in the right ovary. However, given the apparent left ovarian neoplasm suspicious for malignancy, recommend an MRI to further assess the right ovary. 3. Uterine fibroid measuring 3.6 cm.     07/21/2019 Pathology Results   FINAL MICROSCOPIC DIAGNOSIS: A. 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 - See oncology table. B. OMENTUM, RESECTION: - Omental lymph node with metastatic carcinoma (1/1). - Omental adipose tissue with foci of inflammation and reactive mesothelial changes. C. FALLOPIAN TUBE, LEFT, RESECTION: - Fallopian tube with focal, mild inflammation and fibrosis. - No tumor identified. D. UTERUS, CERVIX, RIGHT TUBE AND OVARY: - Cervix Nabothian cyst and squamous metaplasia. - Endometrium Proliferative. No hyperplasia or carcinoma. - Myometrium Leiomyomata. No malignancy. -Right ovary Poorly differentiated carcinoma, 4.8 cm. See oncology table and comment. -Right Fallopian tube Benign paratubal cyst. No endometriosis or malignancy. ONCOLOGY TABLE: OVARY or FALLOPIAN TUBE or PRIMARY PERITONEUM: Procedure: Bilateral f-oophorectomy, hysterectomy and omentectomy. Specimen Integrity: Intact Tumor Site: Left ovary and right ovary. See comment. Ovarian Surface Involvement: Not identified. Fallopian Tube Surface Involvement: Not identified. Tumor Size: Left ovary: 18.6 x 16.0 x 7.2 cm. Right ovary: 4.8 x 3.2 x 3.2 cm. Histologic Type: Clear cell carcinoma. See comment. Histologic Grade: High-grade. Other Tissue/ Organ Involvement: Omental lymph node with metastatic carcinoma. Peritoneal/Ascitic Fluid: Pending. Treatment Effect: No known presurgical therapy. Pathologic Stage Classification (pTNM, AJCC  8th Edition): pT3a, pN1a. Representative Tumor Block: A2, A3, A4, A5 A6, A7 and A8.  Comment(s): The left ovarian tumor is 18.6 cm in greatest dimension and has features of clear cell carcinoma. The right ovarian tumor is 4.8 cm in greatest dimension and is poorly differentiated with focal features consistent with clear cell carcinoma. The pattern of involvement suggests that the right ovarian tumor is likely metastatic from the larger left ovarian clear cell carcinoma. Sections of the omentum show a lymph node with metastatic carcinoma. Sections of the remainder of the omentum do not show involvement by carcinoma.   07/21/2019 Surgery   Procedure(s) Performed: Exploratory laparotomy with radical tumor debulking including total hysterectomy, bilateral salpingo-oophorectomy, infra-gastric omentectomy, and washings.   Surgeon: Jeral Pinch, MD    Operative Findings: On EUA, mobile uterus and ovarian mass, situated out of the pelvis, spanning 6-8 cm above the umbilicus.  On intra-abdominal entry, inflamed omentum adherent to much of the 16 cm left ovarian mass, adhesions easily lysed bluntly.  No nodularity of the ovarian mass however on delivery of the mass through the midline incision, there was Intra-Op rupture of one of the smaller cystic components with drainage of clear brown-tinged fluid.  Grossly normal-appearing left fallopian tube as well as right tube.  Right ovary mildly enlarged measuring approximately 4 cm and firm/nodular, suspicious for tumor involvement.  Uterus 8-10 cm with 4 cm anterior mid body fibroid.  Some very small volume miliary disease along right pelvic sidewall, removed with uterine specimen.  Evidence of small diverticular disease.  Small bowel normal appearing although multiple loops of bowel adherent to each other, especially by mesentery, and an inflammatory manner.  An approximately 2 x 2 centimeter nodule suspicious for tumor implant was found in the omentum just inferior  to the greater curvature of the stomach.  Additional small (less than 5 mm) implants noted on the stomach and posterior aspect of the left lobe of the liver.  Otherwise the liver surface and diaphragm were smooth.   08/03/2019 Cancer Staging   Staging form: Ovary, Fallopian Tube, and Primary Peritoneal Carcinoma, AJCC 8th Edition - Pathologic: Stage IIIA2 (pT3a, pN1, cM0) - Signed by Heath Lark, MD on 08/03/2019   08/12/2019 Procedure   Placement of single lumen port a cath via right internal jugular vein. The catheter tip lies at the cavo-atrial junction. A power injectable port a cath was placed and is ready for immediate use.   08/22/2019 -  Chemotherapy   The patient had carboplatin and taxol for chemotherapy treatment.     09/23/2019 Genetic Testing   Negative genetic testing:  No pathogenic variants detected on the [facility] TumorNext-HRD+CancerNext paired germline and somatic genetic test. The report date is 09/23/2019.  The TumorNext-HRD+CancerNext test offered by [facility] includes paired germline and tumor analyses of 11 genes associated with homologous recombination repair (ATM, BARD1, BRIP1, CHEK2, MRE11A, NBN, PALB2, RAD51C, RAD51D, BRCA1, BRCA2) plus germline analyses of 26 additional genes associated with hereditary cancer (APC, AXIN2, BMPR1A, CDH1, CDK4, CDKN2A, DICER1, HOXB13, EPCAM, GREM1, MLH1, MSH2, MSH3, MSH6, MUTYH, NF1, NTHL1, PMS2, POLD1, POLE, PTEN, RECQL, SMAD4, SMARCA4, STK11, and TP53).   11/14/2019 Tumor Marker   Patient's tumor was tested for the following markers: CA-125 Results of the tumor marker test revealed 10.2.   12/05/2019 Tumor Marker   Patient's tumor was tested for the following markers: CA-125 Results of the tumor marker test revealed 9.9   01/05/2020 Imaging   No specific findings of active malignancy. Interval resection of the pelvic mass. Subtle nodularity along the right side of the vaginal cuff merits surveillance.  01/05/2020 Tumor Marker   Patient's  tumor was tested for the following markers: CA-125 Results of the tumor marker test revealed 9.7   02/17/2020 Tumor Marker   Patient's tumor was tested for the following markers: CA-125 Results of the tumor marker test revealed 9.6   04/05/2020 Imaging   1. There is some minimal stranding along the mesentery and omentum without overt omental caking or well-defined nodularity. Faint bandlike density along the anterior peritoneal reflection in the pelvis, slightly more notable than on prior. Overall the appearance is not considered specific for recurrence but given the slight increase in prominence from 01/05/2020, would encourage careful surveillance by tumor markers and imaging. 2. Mild lower lumbar degenerative facet arthropathy.   04/05/2020 Tumor Marker   Patient's tumor was tested for the following markers: CA-125 Results of the tumor marker test revealed 11   05/29/2020 Tumor Marker   Patient's tumor was tested for the following markers: CA-125. Results of the tumor marker test revealed 17.   07/04/2020 Tumor Marker   Patient's tumor was tested for the following markers: CA-125. Results of the tumor marker test revealed 20.1   07/16/2020 Imaging   Stable mild stranding throughout mesenteric and omental fat, without evidence of discrete masses or ascites. No evidence of new or progressive disease.   08/29/2020 Tumor Marker   Patient's tumor was tested for the following markers: CA-125 Results of the tumor marker test revealed 32.7.   09/07/2020 Imaging   1. Interval progression of peritoneal disease within the abdomen and pelvis. Tumor predominantly involves the serosal surface of the large and small bowel loops. No signs of bowel obstruction identified at this time. 2. No evidence for metastatic disease to the chest.   09/19/2020 Imaging   1. No acute findings identified within the abdomen or pelvis. No evidence for bowel obstruction. 2. Similar appearance of soft tissue thickening  along the peritoneal reflections within the abdomen and pelvis compatible with known peritoneal disease. Recently characterized increased soft tissue along the surface of the jejunal bowel loops and tethering of pelvic small bowel loops is difficult to quantify (and compared with previous exam) due to lack of IV contrast material.     09/20/2020 -  Chemotherapy    Patient is on Treatment Plan: OVARIAN RECURRENT 3RD LINE CARBOPLATIN D1 / GEMCITABINE D1,8 (4/800) Q21D  Carboplatin was discontinued after 10/15/2020 due to allergic reaction      10/01/2020 Tumor Marker   Patient's tumor was tested for the following markers: CA-125. Results of the tumor marker test revealed 37.2.   10/15/2020 Tumor Marker   Patient's tumor was tested for the following markers: CA-125. Results of the tumor marker test revealed 48   10/22/2020 Tumor Marker   Patient's tumor was tested for the following markers: CA-125 Results of the tumor marker test revealed 28.5   11/05/2020 Tumor Marker   Patient's tumor was tested for the following markers: CA-125 Results of the tumor marker test revealed 18.9   12/17/2020 Imaging   1. No significant change in soft tissue thickening and nodularity in the low pelvis, with minimal thickening of the peritoneum throughout the abdomen. 2. Similar appearance of soft tissue thickening about the mid small bowel in the central, ventral abdomen. 3. Findings are consistent with stable peritoneal metastatic disease. 4. Status post hysterectomy and cholecystectomy.   12/18/2020 Tumor Marker   Patient's tumor was tested for the following markers: CA-125 Results of the tumor marker test revealed 10.8   01/11/2021 Imaging  Findings concerning for small bowel obstruction.     01/11/2021 Tumor Marker   Patient's tumor was tested for the following markers: CA-125. Results of the tumor marker test revealed 10.4.   01/11/2021 Imaging   CT abdomen and pelvis Mildly prominent proximal loops  of small bowel although no true transition zone is seen in the distal small bowel appears within normal limits. These changes could represent early partial small bowel obstruction. The overall appearance however is similar to that seen on prior CT from May of 2022.   Mild peritoneal thickening similar to that seen on the prior exam.   Fatty liver.   No other focal abnormality is noted.     Interval History: The patient reports doing well since her last visit.  Motion catheter is still in place although the suture attached to her skin has come loose.  She denies any significant drainage from her catheter for the last 2 weeks.  If she pushes hard on the area around her wounds, she sees some slight brownish discharge from the old fistula site which is a pinpoint incision now.  She denies any green discharge recently.  She reports regular bowel function.  She recently had about a week long period without a bowel movement.  She had some nausea during this time and was not eating very much.  She started MiraLAX which she is taking every other day and is now having at least 1 bowel movement a day.  She denies any nausea or emesis since starting the MiraLAX.  Notes that appetite has been up and down.  Past Medical/Surgical History: Past Medical History:  Diagnosis Date   Anemia due to antineoplastic chemotherapy    Arthritis    Asthma due to seasonal allergies    01-21-2021 per pt seasonal and exercised induced, does not have inhaler, last used one few years   Chronic abdominal pain 01/2021   Chronic nausea    01-21-2021  per pt intermittant nausea and when has abd pain most of time will have vomiting   Family history of breast cancer    Family history of colon cancer    Family history of ovarian cancer    Family history of prostate cancer    History of 2019 novel coronavirus disease (COVID-19) 09/08/2020   positive results in epic, per pt mild symptoms that resolved   History of Helicobacter  pylori infection 07/2016   EGD w/ bx's by [facility],  chronic h.pylori w/ gastritis,  treated  (no ulcer)   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;  progression w/ abdominal carcinomatosis   On total parenteral nutrition (TPN) 02/02/2021   infused thru [facility] in place    Steroid-induced diabetes ([place])    followed by dr Alvy Bimler    Past Surgical History:  Procedure Laterality Date   CHOLECYSTECTOMY N/A 02/16/2015   Procedure: LAPAROSCOPIC CHOLECYSTECTOMY WITH INTRAOPERATIVE CHOLANGIOGRAM;  Surgeon: Johnathan Hausen, MD;  Location: [facility];  Service: General;  Laterality: N/A;   INCISION AND DRAINAGE ABSCESS N/A 02/02/2021   Procedure: INCISION AND DRAINAGE ABSCESS;  Surgeon: Lahoma Crocker, MD;  Location: [facility];  Service: Gynecology;  Laterality: N/A;   IR IMAGING GUIDED PORT INSERTION  08/12/2019   IR REMOVAL TUN ACCESS W/ PORT W/O FL MOD SED  03/18/2021   LAPAROSCOPY N/A 01/23/2021   Procedure: LAPAROSCOPY DIAGNOSTIC;  Surgeon: Berline Lopes,  Corinna Lines, MD;  Location: [facility];  Service: Gynecology;  Laterality: N/A;   LAPAROTOMY N/A 01/23/2021   Procedure: LAPAROTOMY, ENTEROTOMY REPAIR OF THE BOWEL;  Surgeon: Lafonda Mosses, MD;  [facility];  Service: Gynecology;  Laterality: N/A;   SALPINGOOPHORECTOMY N/A 07/21/2019   Procedure: EXPLORATORY LAPAROTOMY, TOTAL ABDOMINAL HYSTERECTOMY, BILATERAL SALPINGO OOPHORECTOMY, OMENTECTOMY;  Surgeon: Lafonda Mosses, MD;  Location: [facility];  Service: Gynecology;  Laterality: N/A;   UPPER GASTROINTESTINAL ENDOSCOPY  08/01/2016   by dr Corinda Gubler in [place] N/A 03/11/2021   Procedure: OPENING AND EXPLORATION OF ABDOMINAL EXCISION;  Surgeon: Lafonda Mosses, MD;  Location: [facility];  Service: Gynecology;  Laterality: N/A;     Family History  Problem Relation Age of Onset   Colon cancer Father 50   Prostate cancer Father 37   Colon cancer Paternal Aunt        dx. early 68s   Ovarian cancer Paternal Grandmother 7   Breast cancer Cousin    Endometriosis Mother    Cancer Paternal Uncle        unknown type, dx. early 29s   Cancer Paternal Uncle 53       unknown type   Esophageal cancer Neg Hx     Social History   Socioeconomic History   Marital status: Married    Spouse name: Herbie [place]   Number of children: 2   Years of education: Not on file   Highest education level: Not on file  Occupational History   Not on file  Tobacco Use   Smoking status: Never   Smokeless tobacco: Never  Vaping Use   Vaping Use: Never used  Substance and Sexual Activity   Alcohol use: Yes    Comment: Social drinker   Drug use: Never   Sexual activity: Yes    Birth control/protection: Surgical  Other Topics Concern   Not on file  Social History Narrative   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  Newly married as of October 2020.   2 boys, age 61 and 73   Social Determinants of Health   Emergency planning/management officer Strain: Not on file  Food Insecurity: Not on file  Transportation Needs: Not on file  Physical Activity: Not on file  Stress: Not on file  Social Connections: Not on file    Current Medications:  Current Outpatient Medications:    Accu-Chek Softclix Lancets lancets, Use as instructed, Disp: 100 each, Rfl: 12   acetaminophen (TYLENOL) 325 MG tablet, Take 650 mg by mouth every 6 (six) hours as needed for mild pain, fever or headache., Disp: , Rfl:    acetaminophen (TYLENOL) 650 MG suppository, Place 1 suppository (650 mg total) rectally every 6 (six) hours as needed for moderate pain., Disp: 15 suppository, Rfl: 2   fluconazole (DIFLUCAN) 200 MG tablet, Take 2 tablets by mouth daily., Disp: 10 tablet, Rfl: 0   glucose blood test strip, Use as instructed, Disp: 100 each, Rfl: 12   lidocaine-prilocaine  (EMLA) cream, APPLY TO THE AFFECTED AREA AS DIRECTED TO ACCESS PORT, Disp: 30 g, Rfl: 11   LORazepam (ATIVAN) 0.5 MG tablet, Take 1 tablet by mouth every 8 hours as needed for anxiety/insomnia. Do not take and drive., Disp: 15 tablet, Rfl: 0   Multiple Vitamin (MULTIVITAMIN WITH MINERALS) TABS tablet, Take 1 tablet by mouth daily., Disp: , Rfl:    omeprazole (PRILOSEC) 40 MG capsule, Take 1 capsule (  40 mg total) by mouth daily., Disp: 30 capsule, Rfl: 3   ondansetron (ZOFRAN ODT) 4 MG disintegrating tablet, Take 1 tablet (4 mg total) by mouth every 4 (four) hours as needed for up to 20 doses for nausea or vomiting., Disp: 20 tablet, Rfl: 0   promethazine (PHENERGAN) 12.5 MG suppository, Place 1 suppository (12.5 mg total) rectally every 8 (eight) hours as needed for up to 30 doses for nausea or vomiting., Disp: 30 each, Rfl: 0   cephALEXin (KEFLEX) 500 MG capsule, Take 1 capsule by mouth 4 times daily for 10 days., Disp: 40 capsule, Rfl: 0   Ostomy Supplies Pouch MISC, 1 each by Does not apply route as needed Kellie Simmering [phone]). Kellie Simmering [phone] (Patient not taking: Reported on 04/24/2021), Disp: 15 Bag, Rfl: 0   oxyCODONE (OXY IR/ROXICODONE) 5 MG immediate release tablet, Take 1 tablet (5 mg total) by mouth every 4 (four) hours as needed for severe pain. Do not take and drive (Patient not taking: Reported on 04/24/2021), Disp: 30 tablet, Rfl: 0  Review of Systems: Denies fevers, chills, fatigue, unexplained weight changes. Denies hearing loss, neck lumps or masses, mouth sores, ringing in ears or voice changes. Denies cough or wheezing.  Denies shortness of breath. Denies chest pain or palpitations. Denies leg swelling. Denies abdominal distention, pain, blood in stools, constipation, diarrhea, nausea, vomiting, or early satiety. Denies pain with intercourse, dysuria, frequency, hematuria or incontinence. Denies hot flashes, pelvic pain, vaginal bleeding or vaginal discharge.   Denies joint pain, back pain or  muscle pain/cramps. Denies itching, rash, or wounds. Denies dizziness, headaches, numbness or seizures. Denies swollen lymph nodes or glands, denies easy bruising or bleeding. Denies anxiety, depression, confusion, or decreased concentration.  Physical Exam: BP 120/87 (BP Location: Left Arm, Patient Position: Sitting)   Pulse 80   Temp 98.1 F (36.7 C) (Oral)   Resp 16   Ht _0  (1.575 m)   Wt 176 lb 3.2 oz (79.9 kg)   LMP 07/05/2019   SpO2 100%   BMI 32.23 kg/m  General: Alert, oriented, no acute distress HEENT: Normocephalic, atraumatic, sclera anicteric. Pulmonary: Unlabored breathing on room air. Abdomen: Soft, nondistended, normal bowel sounds.  Bandage removed.  Mushroom catheter is in place although no longer attached to the skin by suture.  There is minimal if any drainage noted within the catheter itself.  With significant palpation and pressure around the prior fistula site, there is very minimal brown-tinged, mildly bloody discharge that can be expelled.  Patient has no significant pain with deep palpation.  Laboratory & Radiologic Studies: None new  Assessment & Plan: Sarah Newman is a 45 y.o. woman with history of ovarian cancer who has an EC fistula after diagnostic surgery converted to open surgery complicated by bowel injury and repair in the setting of recent avastin now with decreasing output but who has had closure of skin incision multiple times leading to subcutaneous collection of succus resulting in abscess formation.   Patient is doing very well with what I suspect is closure of her EC fistula.  She unfortunately was not able to take the antibiotics that she was initially prescribed given some nausea and decreased p.o. intake related to bowel function.  She is going to finish them now.  She is no longer utilizing an [place] pouch or an ostomy bag on her incision secondary to minimal if any drainage.  We will get her set up for a CT scan with oral contrast in  about a week  and a half to evaluate whether there is any spillage of oral contrast in the subcutaneous tissue.  If they are no longer appears to be imaging evidence of a fistula, then we will plan for an outpatient procedure to remove the mushroom catheter under anesthesia.  32 minutes of total time was spent for this patient encounter, including preparation, face-to-face counseling with the patient and coordination of care, and documentation of the encounter.  Jeral Pinch, MD  Division of Gynecologic Oncology  Department of Obstetrics and Gynecology  [facility]

## 2021-04-29 ENCOUNTER — Telehealth: Payer: Self-pay | Admitting: *Deleted

## 2021-04-29 NOTE — Telephone Encounter (Signed)
Attempted to call patient on 9/23 and 9/26 with no answer and unable to leave message voicemail full

## 2021-04-30 ENCOUNTER — Encounter: Payer: Self-pay | Admitting: Hematology and Oncology

## 2021-04-30 NOTE — Telephone Encounter (Signed)
Spoke with the patient and gave the CT appt with instructions

## 2021-05-02 ENCOUNTER — Other Ambulatory Visit: Payer: Self-pay | Admitting: Gynecologic Oncology

## 2021-05-02 ENCOUNTER — Telehealth: Payer: Self-pay

## 2021-05-02 DIAGNOSIS — K632 Fistula of intestine: Secondary | ICD-10-CM

## 2021-05-02 NOTE — Telephone Encounter (Signed)
Returning call to [facility], patient calling to request water based contrast for her CT scan on 05/06/21. Radiology notified, patient to arrive 2 hours prior to her scan to begin contrast. Patient verbalized understanding.  Patient reports nausea and reduced appetite. She has not had a BM since Sunday 04/27/21 and has been taking miralax and colace with no relief. She has been eating a light diet of applesauce, grits, icee and boost protein juice. She states she feels weak since she hasn't been able to eat much. She has taken zofran but she states it helps for 20 min and then the nausea returns.   She reports constant pain above her wound where the "mushroom bulb" is located. She states the pain is constant, is worse with movement, burns & stings and rates pain as 8/10. She has liquid dilaudid but doesn't want to take it for fear it will worsen constipation. Patient is going to try to take tylenol for discomfort.   Instructed patient that Dr. Berline Lopes is currently in the OR but will be notified. It may be tomorrow morning before the office reaches out to her. Patient verbalized understanding. Provided patient with after hours phone number should she need it.

## 2021-05-02 NOTE — Progress Notes (Signed)
Returned patient's call. She reports no bowel function in 5 days despite Mirilax use. Has been tolerating liquids, some nausea and hasn't been taking much solid intake. Denies fevers or chills. Has pain and hard area just superior to mushroom catheter.   Discussed that she could either come in to ER tonight or that I will see her first thing in the morning. She prefers to see me in the am (labs, clinic visit, and plan for STAT CT). If things worsen overnight, she will come in for evaluation.  Jeral Pinch MD Gynecologic Oncology

## 2021-05-03 ENCOUNTER — Inpatient Hospital Stay (HOSPITAL_COMMUNITY): Payer: Medicaid Other

## 2021-05-03 ENCOUNTER — Inpatient Hospital Stay: Payer: Medicaid Other

## 2021-05-03 ENCOUNTER — Inpatient Hospital Stay: Payer: Medicaid Other | Admitting: Gynecologic Oncology

## 2021-05-03 ENCOUNTER — Other Ambulatory Visit: Payer: Self-pay

## 2021-05-03 ENCOUNTER — Encounter: Payer: Self-pay | Admitting: Hematology and Oncology

## 2021-05-03 ENCOUNTER — Ambulatory Visit (HOSPITAL_COMMUNITY): Payer: Medicaid Other

## 2021-05-03 ENCOUNTER — Emergency Department (HOSPITAL_COMMUNITY): Payer: Medicaid Other

## 2021-05-03 ENCOUNTER — Encounter (HOSPITAL_COMMUNITY)
Admission: EM | Disposition: A | Discharge status: Home / Self Care | Payer: Self-pay | Source: Home / Self Care | Attending: Gynecologic Oncology

## 2021-05-03 ENCOUNTER — Inpatient Hospital Stay (HOSPITAL_COMMUNITY)
Admission: EM | Admit: 2021-05-03 | Discharge: 2021-05-06 | DRG: 603 | Disposition: A | Discharge status: Home / Self Care | Payer: Medicaid Other | Attending: Gynecologic Oncology | Admitting: Gynecologic Oncology

## 2021-05-03 ENCOUNTER — Telehealth: Payer: Self-pay

## 2021-05-03 ENCOUNTER — Encounter (HOSPITAL_COMMUNITY): Payer: Self-pay

## 2021-05-03 ENCOUNTER — Emergency Department (HOSPITAL_COMMUNITY): Payer: Medicaid Other | Admitting: Anesthesiology

## 2021-05-03 DIAGNOSIS — Z0189 Encounter for other specified special examinations: Secondary | ICD-10-CM

## 2021-05-03 DIAGNOSIS — L02211 Cutaneous abscess of abdominal wall: Secondary | ICD-10-CM | POA: Diagnosis present

## 2021-05-03 DIAGNOSIS — Z8543 Personal history of malignant neoplasm of ovary: Secondary | ICD-10-CM

## 2021-05-03 DIAGNOSIS — Z800 Family history of malignant neoplasm of digestive organs: Secondary | ICD-10-CM

## 2021-05-03 DIAGNOSIS — K5669 Other partial intestinal obstruction: Secondary | ICD-10-CM | POA: Diagnosis present

## 2021-05-03 DIAGNOSIS — E876 Hypokalemia: Secondary | ICD-10-CM

## 2021-05-03 DIAGNOSIS — R112 Nausea with vomiting, unspecified: Secondary | ICD-10-CM

## 2021-05-03 DIAGNOSIS — G8918 Other acute postprocedural pain: Secondary | ICD-10-CM

## 2021-05-03 DIAGNOSIS — Z90710 Acquired absence of both cervix and uterus: Secondary | ICD-10-CM | POA: Diagnosis not present

## 2021-05-03 DIAGNOSIS — Z803 Family history of malignant neoplasm of breast: Secondary | ICD-10-CM

## 2021-05-03 DIAGNOSIS — Z8616 Personal history of COVID-19: Secondary | ICD-10-CM

## 2021-05-03 DIAGNOSIS — E099 Drug or chemical induced diabetes mellitus without complications: Secondary | ICD-10-CM | POA: Diagnosis present

## 2021-05-03 DIAGNOSIS — K56600 Partial intestinal obstruction, unspecified as to cause: Secondary | ICD-10-CM

## 2021-05-03 DIAGNOSIS — T380X5A Adverse effect of glucocorticoids and synthetic analogues, initial encounter: Secondary | ICD-10-CM | POA: Diagnosis present

## 2021-05-03 DIAGNOSIS — Z8041 Family history of malignant neoplasm of ovary: Secondary | ICD-10-CM

## 2021-05-03 DIAGNOSIS — Z9221 Personal history of antineoplastic chemotherapy: Secondary | ICD-10-CM

## 2021-05-03 DIAGNOSIS — L03319 Cellulitis of trunk, unspecified: Secondary | ICD-10-CM

## 2021-05-03 DIAGNOSIS — Z4659 Encounter for fitting and adjustment of other gastrointestinal appliance and device: Secondary | ICD-10-CM

## 2021-05-03 DIAGNOSIS — L02219 Cutaneous abscess of trunk, unspecified: Secondary | ICD-10-CM

## 2021-05-03 DIAGNOSIS — R188 Other ascites: Secondary | ICD-10-CM

## 2021-05-03 HISTORY — PX: LAPAROTOMY: SHX154

## 2021-05-03 LAB — CBC
HCT: 34.7 % — ABNORMAL LOW (ref 36.0–46.0)
Hemoglobin: 10.9 g/dL — ABNORMAL LOW (ref 12.0–15.0)
MCH: 26.1 pg (ref 26.0–34.0)
MCHC: 31.4 g/dL (ref 30.0–36.0)
MCV: 83.2 fL (ref 80.0–100.0)
Platelets: 289 10*3/uL (ref 150–400)
RBC: 4.17 MIL/uL (ref 3.87–5.11)
RDW: 14.8 % (ref 11.5–15.5)
WBC: 11.2 10*3/uL — ABNORMAL HIGH (ref 4.0–10.5)
nRBC: 0 % (ref 0.0–0.2)

## 2021-05-03 LAB — COMPREHENSIVE METABOLIC PANEL
ALT: 13 U/L (ref 0–44)
AST: 22 U/L (ref 15–41)
Albumin: 4 g/dL (ref 3.5–5.0)
Alkaline Phosphatase: 94 U/L (ref 38–126)
Anion gap: 13 (ref 5–15)
BUN: 9 mg/dL (ref 6–20)
CO2: 26 mmol/L (ref 22–32)
Calcium: 9.4 mg/dL (ref 8.9–10.3)
Chloride: 99 mmol/L (ref 98–111)
Creatinine, Ser: 0.59 mg/dL (ref 0.44–1.00)
GFR, Estimated: >60 mL/min (ref >60)
Glucose, Bld: 110 mg/dL — ABNORMAL HIGH (ref 70–99)
Potassium: 3.6 mmol/L (ref 3.5–5.1)
Sodium: 138 mmol/L (ref 135–145)
Total Bilirubin: 0.8 mg/dL (ref 0.3–1.2)
Total Protein: 8.8 g/dL — ABNORMAL HIGH (ref 6.5–8.1)

## 2021-05-03 LAB — RESP PANEL BY RT-PCR (FLU A&B, COVID) ARPGX2
Influenza A by PCR: NEGATIVE
Influenza B by PCR: NEGATIVE
SARS Coronavirus 2 by RT PCR: NEGATIVE

## 2021-05-03 LAB — LIPASE, BLOOD: Lipase: 54 U/L — ABNORMAL HIGH (ref 11–51)

## 2021-05-03 SURGERY — LAPAROTOMY, EXPLORATORY
Anesthesia: General

## 2021-05-03 MED ORDER — ROCURONIUM BROMIDE 100 MG/10ML IV SOLN
INTRAVENOUS | Status: DC | PRN
  Administered 2021-05-03: 25 mg via INTRAVENOUS
  Administered 2021-05-03: 5 mg via INTRAVENOUS

## 2021-05-03 MED ORDER — PROPOFOL 10 MG/ML IV BOLUS
INTRAVENOUS | Status: DC | PRN
  Administered 2021-05-03: 170 mg via INTRAVENOUS
  Administered 2021-05-03: 30 mg via INTRAVENOUS

## 2021-05-03 MED ORDER — HYDROMORPHONE HCL 1 MG/ML IJ SOLN
INTRAMUSCULAR | Status: DC | PRN
  Administered 2021-05-03 (×4): .5 mg via INTRAVENOUS

## 2021-05-03 MED ORDER — FENTANYL CITRATE PF 50 MCG/ML IJ SOSY: 100.0000 ug | PREFILLED_SYRINGE | INTRAMUSCULAR | Status: AC

## 2021-05-03 MED ORDER — ACETAMINOPHEN 650 MG RE SUPP: 650.0000 mg | Freq: Four times a day (QID) | RECTAL | Status: DC | PRN

## 2021-05-03 MED ORDER — MIDAZOLAM HCL 5 MG/5ML IJ SOLN
INTRAMUSCULAR | Status: DC | PRN
  Administered 2021-05-03 (×2): 1 mg via INTRAVENOUS

## 2021-05-03 MED ORDER — PROPOFOL 10 MG/ML IV BOLUS
INTRAVENOUS | Status: AC
  Filled 2021-05-03: qty 20

## 2021-05-03 MED ORDER — KETAMINE HCL 10 MG/ML IJ SOLN
INTRAMUSCULAR | Status: DC | PRN
  Administered 2021-05-03: 30 mg via INTRAVENOUS

## 2021-05-03 MED ORDER — ONDANSETRON HCL 4 MG/2ML IJ SOLN
4.0000 mg | Freq: Four times a day (QID) | INTRAMUSCULAR | Status: DC | PRN
  Administered 2021-05-04 (×2): 4 mg via INTRAVENOUS
  Filled 2021-05-03 (×2): qty 2

## 2021-05-03 MED ORDER — BUPIVACAINE HCL 0.25 % IJ SOLN
INTRAMUSCULAR | Status: DC | PRN
  Administered 2021-05-03: 10 mL

## 2021-05-03 MED ORDER — CEFAZOLIN SODIUM-DEXTROSE 2-4 GM/100ML-% IV SOLN
2.0000 g | INTRAVENOUS | Status: AC
  Administered 2021-05-03: 2 g via INTRAVENOUS
  Filled 2021-05-03: qty 100

## 2021-05-03 MED ORDER — ONDANSETRON HCL 4 MG/2ML IJ SOLN
INTRAMUSCULAR | Status: DC | PRN
  Administered 2021-05-03: 4 mg via INTRAVENOUS

## 2021-05-03 MED ORDER — LACTATED RINGERS IV SOLN: INTRAVENOUS | Status: DC

## 2021-05-03 MED ORDER — KETAMINE HCL 10 MG/ML IJ SOLN
INTRAMUSCULAR | Status: AC
  Filled 2021-05-03: qty 1

## 2021-05-03 MED ORDER — FENTANYL CITRATE (PF) 250 MCG/5ML IJ SOLN
INTRAMUSCULAR | Status: AC
  Filled 2021-05-03: qty 5

## 2021-05-03 MED ORDER — LIDOCAINE HCL (CARDIAC) PF 100 MG/5ML IV SOSY
PREFILLED_SYRINGE | INTRAVENOUS | Status: DC | PRN
Start: 2021-05-03 — End: 2021-05-03
  Administered 2021-05-03: 50 mg via INTRAVENOUS

## 2021-05-03 MED ORDER — SUGAMMADEX SODIUM 200 MG/2ML IV SOLN
INTRAVENOUS | Status: DC | PRN
  Administered 2021-05-03: 200 mg via INTRAVENOUS

## 2021-05-03 MED ORDER — KCL IN DEXTROSE-NACL 20-5-0.45 MEQ/L-%-% IV SOLN
INTRAVENOUS | Status: DC
  Filled 2021-05-03 (×4): qty 1000

## 2021-05-03 MED ORDER — ONDANSETRON HCL 4 MG PO TABS: 4.0000 mg | ORAL_TABLET | Freq: Four times a day (QID) | ORAL | Status: DC | PRN

## 2021-05-03 MED ORDER — LACTATED RINGERS IV BOLUS
1000.0000 mL | Freq: Once | INTRAVENOUS | Status: AC
  Administered 2021-05-03: 1000 mL via INTRAVENOUS

## 2021-05-03 MED ORDER — ESMOLOL HCL 100 MG/10ML IV SOLN
INTRAVENOUS | Status: AC
  Filled 2021-05-03: qty 10

## 2021-05-03 MED ORDER — DEXAMETHASONE SODIUM PHOSPHATE 10 MG/ML IJ SOLN
INTRAMUSCULAR | Status: DC | PRN
  Administered 2021-05-03: 4 mg via INTRAVENOUS

## 2021-05-03 MED ORDER — FENTANYL CITRATE (PF) 100 MCG/2ML IJ SOLN
INTRAMUSCULAR | Status: DC | PRN
  Administered 2021-05-03: 50 ug via INTRAVENOUS
  Administered 2021-05-03: 100 ug via INTRAVENOUS
  Administered 2021-05-03 (×2): 50 ug via INTRAVENOUS

## 2021-05-03 MED ORDER — 0.9 % SODIUM CHLORIDE (POUR BTL) OPTIME
TOPICAL | Status: DC | PRN
  Administered 2021-05-03: 1000 mL

## 2021-05-03 MED ORDER — FENTANYL CITRATE PF 50 MCG/ML IJ SOSY
PREFILLED_SYRINGE | INTRAMUSCULAR | Status: AC
  Administered 2021-05-03: 100 ug via INTRAVENOUS
  Filled 2021-05-03: qty 2

## 2021-05-03 MED ORDER — HYDROMORPHONE HCL 1 MG/ML IJ SOLN
1.0000 mg | Freq: Once | INTRAMUSCULAR | Status: AC
Start: 2021-05-03 — End: 2021-05-03
  Administered 2021-05-03: 1 mg via INTRAVENOUS
  Filled 2021-05-03: qty 1

## 2021-05-03 MED ORDER — METOCLOPRAMIDE HCL 5 MG/ML IJ SOLN
5.0000 mg | Freq: Once | INTRAMUSCULAR | Status: AC
  Administered 2021-05-03: 5 mg via INTRAVENOUS
  Filled 2021-05-03: qty 2

## 2021-05-03 MED ORDER — DEXAMETHASONE SODIUM PHOSPHATE 10 MG/ML IJ SOLN
INTRAMUSCULAR | Status: AC
  Filled 2021-05-03: qty 1

## 2021-05-03 MED ORDER — CHLORHEXIDINE GLUCONATE 0.12 % MT SOLN
15.0000 mL | Freq: Once | OROMUCOSAL | Status: AC
  Administered 2021-05-03: 15 mL via OROMUCOSAL
  Filled 2021-05-03: qty 15

## 2021-05-03 MED ORDER — HYDROMORPHONE HCL 1 MG/ML IJ SOLN: 0.2500 mg | INTRAMUSCULAR | Status: DC | PRN

## 2021-05-03 MED ORDER — SODIUM CHLORIDE 0.9 % IV SOLN
1.0000 g | Freq: Three times a day (TID) | INTRAVENOUS | Status: DC
  Administered 2021-05-03 – 2021-05-04 (×3): 1 g via INTRAVENOUS
  Filled 2021-05-03 (×3): qty 1

## 2021-05-03 MED ORDER — PROMETHAZINE HCL 12.5 MG RE SUPP
12.5000 mg | Freq: Three times a day (TID) | RECTAL | Status: DC | PRN
  Filled 2021-05-03: qty 1

## 2021-05-03 MED ORDER — SUCCINYLCHOLINE CHLORIDE 200 MG/10ML IV SOSY
PREFILLED_SYRINGE | INTRAVENOUS | Status: AC
  Filled 2021-05-03: qty 10

## 2021-05-03 MED ORDER — LIDOCAINE HCL (PF) 2 % IJ SOLN
INTRAMUSCULAR | Status: AC
  Filled 2021-05-03: qty 5

## 2021-05-03 MED ORDER — ESMOLOL HCL 100 MG/10ML IV SOLN
INTRAVENOUS | Status: DC | PRN
  Administered 2021-05-03: 25 mg via INTRAVENOUS

## 2021-05-03 MED ORDER — ONDANSETRON HCL 4 MG/2ML IJ SOLN
INTRAMUSCULAR | Status: AC
  Filled 2021-05-03: qty 2

## 2021-05-03 MED ORDER — MIDAZOLAM HCL 2 MG/2ML IJ SOLN
INTRAMUSCULAR | Status: AC
  Filled 2021-05-03: qty 2

## 2021-05-03 MED ORDER — BUPIVACAINE HCL (PF) 0.25 % IJ SOLN
INTRAMUSCULAR | Status: AC
  Filled 2021-05-03: qty 30

## 2021-05-03 MED ORDER — HYDROMORPHONE HCL 2 MG/ML IJ SOLN
INTRAMUSCULAR | Status: AC
  Filled 2021-05-03: qty 1

## 2021-05-03 MED ORDER — SUCCINYLCHOLINE CHLORIDE 200 MG/10ML IV SOSY
PREFILLED_SYRINGE | INTRAVENOUS | Status: DC | PRN
  Administered 2021-05-03: 140 mg via INTRAVENOUS

## 2021-05-03 MED ORDER — HYDROMORPHONE HCL 1 MG/ML IJ SOLN: 0.5000 mg | Freq: Once | INTRAMUSCULAR | Status: DC

## 2021-05-03 MED ORDER — HYDROMORPHONE HCL 1 MG/ML IJ SOLN
0.2000 mg | INTRAMUSCULAR | Status: DC | PRN
  Administered 2021-05-03 – 2021-05-04 (×8): 0.6 mg via INTRAVENOUS
  Administered 2021-05-04: 0.5 mg via INTRAVENOUS
  Administered 2021-05-04: 0.6 mg via INTRAVENOUS
  Administered 2021-05-05 (×4): 0.5 mg via INTRAVENOUS
  Administered 2021-05-05: 0.6 mg via INTRAVENOUS
  Administered 2021-05-05 – 2021-05-06 (×3): 0.5 mg via INTRAVENOUS
  Administered 2021-05-06: 0.6 mg via INTRAVENOUS
  Administered 2021-05-06 (×2): 0.5 mg via INTRAVENOUS
  Filled 2021-05-03 (×22): qty 1

## 2021-05-03 MED ORDER — ENOXAPARIN SODIUM 40 MG/0.4ML IJ SOSY
40.0000 mg | PREFILLED_SYRINGE | INTRAMUSCULAR | Status: DC
  Administered 2021-05-04 – 2021-05-06 (×3): 40 mg via SUBCUTANEOUS
  Filled 2021-05-03 (×3): qty 0.4

## 2021-05-03 MED ORDER — PANTOPRAZOLE SODIUM 40 MG IV SOLR
40.0000 mg | Freq: Every day | INTRAVENOUS | Status: DC
  Administered 2021-05-03 – 2021-05-04 (×2): 40 mg via INTRAVENOUS
  Filled 2021-05-03 (×2): qty 40

## 2021-05-03 MED ORDER — ROCURONIUM BROMIDE 10 MG/ML (PF) SYRINGE
PREFILLED_SYRINGE | INTRAVENOUS | Status: AC
  Filled 2021-05-03: qty 10

## 2021-05-03 SURGICAL SUPPLY — 68 items
ATTRACTOMAT 16X20 MAGNETIC DRP (DRAPES) IMPLANT
BAG COUNTER SPONGE SURGICOUNT (BAG) IMPLANT
BLADE EXTENDED COATED 6.5IN (ELECTRODE) ×2 IMPLANT
CATH ULTRATHANE 10.2FR (CATHETERS) ×2 IMPLANT
CELLS DAT CNTRL 66122 CELL SVR (MISCELLANEOUS) IMPLANT
CHLORAPREP W/TINT 26 (MISCELLANEOUS) ×2 IMPLANT
CLIP TI LARGE 6 (CLIP) ×2 IMPLANT
CLIP TI MEDIUM 6 (CLIP) ×2 IMPLANT
CLIP TI MEDIUM LARGE 6 (CLIP) ×2 IMPLANT
CNTNR URN SCR LID CUP LEK RST (MISCELLANEOUS) IMPLANT
CONT SPEC 4OZ STRL OR WHT (MISCELLANEOUS)
DERMABOND ADVANCED (GAUZE/BANDAGES/DRESSINGS)
DERMABOND ADVANCED .7 DNX12 (GAUZE/BANDAGES/DRESSINGS) IMPLANT
DRAPE INCISE IOBAN 66X45 STRL (DRAPES) IMPLANT
DRAPE SURG IRRIG POUCH 19X23 (DRAPES) ×2 IMPLANT
DRAPE WARM FLUID 44X44 (DRAPES) ×2 IMPLANT
DRSG OPSITE POSTOP 4X10 (GAUZE/BANDAGES/DRESSINGS) IMPLANT
DRSG OPSITE POSTOP 4X6 (GAUZE/BANDAGES/DRESSINGS) IMPLANT
DRSG OPSITE POSTOP 4X8 (GAUZE/BANDAGES/DRESSINGS) IMPLANT
DRSG PAD ABDOMINAL 8X10 ST (GAUZE/BANDAGES/DRESSINGS) ×2 IMPLANT
ELECT REM PT RETURN 15FT ADLT (MISCELLANEOUS) ×2 IMPLANT
GAUZE 4X4 16PLY ~~LOC~~+RFID DBL (SPONGE) IMPLANT
GAUZE PACKING IODOFORM 2 (PACKING) ×2 IMPLANT
GLOVE SURG ENC MOIS LTX SZ6 (GLOVE) ×4 IMPLANT
GLOVE SURG ENC MOIS LTX SZ6.5 (GLOVE) ×4 IMPLANT
GOWN STRL REUS W/ TWL LRG LVL3 (GOWN DISPOSABLE) ×2 IMPLANT
GOWN STRL REUS W/TWL LRG LVL3 (GOWN DISPOSABLE) ×2
HEMOSTAT ARISTA ABSORB 3G PWDR (HEMOSTASIS) IMPLANT
KIT BASIN OR (CUSTOM PROCEDURE TRAY) ×2 IMPLANT
KIT TUBING JP BULB W/EXT (MISCELLANEOUS) ×2 IMPLANT
KIT TURNOVER KIT A (KITS) ×2 IMPLANT
LIGASURE IMPACT 36 18CM CVD LR (INSTRUMENTS) IMPLANT
LOOP VESSEL MAXI BLUE (MISCELLANEOUS) IMPLANT
NEEDLE HYPO 22GX1.5 SAFETY (NEEDLE) ×4 IMPLANT
NS IRRIG 1000ML POUR BTL (IV SOLUTION) ×4 IMPLANT
PACK GENERAL/GYN (CUSTOM PROCEDURE TRAY) ×2 IMPLANT
RELOAD PROXIMATE 75MM BLUE (ENDOMECHANICALS) IMPLANT
RELOAD PROXIMATE TA60MM BLUE (ENDOMECHANICALS) IMPLANT
RETRACTOR WND ALEXIS 25 LRG (MISCELLANEOUS) IMPLANT
RTRCTR WOUND ALEXIS 18CM MED (MISCELLANEOUS)
RTRCTR WOUND ALEXIS 25CM LRG (MISCELLANEOUS)
SCRUB EXIDINE 4% CHG 4OZ (MISCELLANEOUS) ×2 IMPLANT
SHEET LAVH (DRAPES) ×2 IMPLANT
SLEEVE SUCTION CATH 165 (SLEEVE) ×2 IMPLANT
SPONGE T-LAP 18X18 ~~LOC~~+RFID (SPONGE) IMPLANT
STAPLER GUN LINEAR PROX 60 (STAPLE) IMPLANT
STAPLER PROXIMATE 75MM BLUE (STAPLE) IMPLANT
STAPLER VISISTAT 35W (STAPLE) IMPLANT
SURGIFLO W/THROMBIN 8M KIT (HEMOSTASIS) IMPLANT
SUT MNCRL AB 4-0 PS2 18 (SUTURE) ×4 IMPLANT
SUT PDS AB 1 TP1 96 (SUTURE) ×4 IMPLANT
SUT SILK 3 0 SH CR/8 (SUTURE) IMPLANT
SUT VIC AB 0 CT1 36 (SUTURE) ×8 IMPLANT
SUT VIC AB 2-0 CT1 27 (SUTURE) ×2
SUT VIC AB 2-0 CT1 36 (SUTURE) ×4 IMPLANT
SUT VIC AB 2-0 CT1 TAPERPNT 27 (SUTURE) ×2 IMPLANT
SUT VIC AB 2-0 CT2 27 (SUTURE) ×12 IMPLANT
SUT VIC AB 2-0 SH 27 (SUTURE)
SUT VIC AB 2-0 SH 27X BRD (SUTURE) IMPLANT
SUT VIC AB 3-0 CTX 36 (SUTURE) IMPLANT
SUT VIC AB 3-0 SH 18 (SUTURE) IMPLANT
SUT VIC AB 3-0 SH 27 (SUTURE) ×1
SUT VIC AB 3-0 SH 27X BRD (SUTURE) ×1 IMPLANT
SYR 30ML LL (SYRINGE) ×4 IMPLANT
TOWEL OR 17X26 10 PK STRL BLUE (TOWEL DISPOSABLE) ×2 IMPLANT
TOWEL OR NON WOVEN STRL DISP B (DISPOSABLE) ×2 IMPLANT
TRAY FOLEY MTR SLVR 16FR STAT (SET/KITS/TRAYS/PACK) ×2 IMPLANT
UNDERPAD 30X36 HEAVY ABSORB (UNDERPADS AND DIAPERS) ×2 IMPLANT

## 2021-05-03 NOTE — Anesthesia Preprocedure Evaluation (Addendum)
Anesthesia Evaluation  Patient identified by MRN, date of birth, ID band Patient awake    Reviewed: Allergy & Precautions, NPO status , Patient's Chart, lab work & pertinent test results  Airway Mallampati: II  TM Distance: >3 FB Neck ROM: Full    Dental no notable dental hx. (+) Teeth Intact, Dental Advisory Given   Pulmonary asthma ,    Pulmonary exam normal breath sounds clear to auscultation       Cardiovascular negative cardio ROS Normal cardiovascular exam Rhythm:Regular Rate:Normal  TTE 2022 1. Left ventricular ejection fraction, by estimation, is 55 to 60%. The  left ventricle has normal function. The left ventricle has no regional  wall motion abnormalities. Left ventricular diastolic parameters were  normal.  2. Right ventricular systolic function is low normal. The right  ventricular size is normal. Tricuspid regurgitation signal is inadequate  for assessing PA pressure.  3. The mitral valve is grossly normal. Trivial mitral valve  regurgitation.  4. The aortic valve is tricuspid. Aortic valve regurgitation is not  visualized.  5. The inferior vena cava is normal in size with greater than 50%  respiratory variability, suggesting right atrial pressure of 3 mmHg.    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 Now with infected mushroom catheter.    Reproductive/Obstetrics                          Anesthesia Physical Anesthesia Plan  ASA: 3  Anesthesia Plan: General   Post-op Pain Management:    Induction: Intravenous and Rapid sequence  PONV Risk Score and Plan: 3 and Midazolam, Dexamethasone and Ondansetron  Airway Management Planned: Oral ETT  Additional Equipment:   Intra-op Plan:   Post-operative Plan: Extubation in OR  Informed Consent: I have reviewed the patients History and Physical, chart, labs and discussed the procedure including the risks, benefits and alternatives for the proposed anesthesia with the patient or authorized representative who has indicated his/her understanding and acceptance.     Dental advisory given  Plan Discussed with: CRNA  Anesthesia Plan Comments:        Anesthesia Quick Evaluation

## 2021-05-03 NOTE — Anesthesia Procedure Notes (Signed)
Procedure Name: Intubation Date/Time: 05/03/2021 4:00 PM Performed by: Garrel Ridgel, CRNA Pre-anesthesia Checklist: Patient identified, Emergency Drugs available, Suction available and Patient being monitored Patient Re-evaluated:Patient Re-evaluated prior to induction Oxygen Delivery Method: Circle system utilized Preoxygenation: Pre-oxygenation with 100% oxygen Induction Type: IV induction Ventilation: Mask ventilation without difficulty Laryngoscope Size: Mac and 3 Grade View: Grade I Tube type: Oral Tube size: 7.0 mm Number of attempts: 1 Airway Equipment and Method: Stylet and Oral airway Placement Confirmation: ETT inserted through vocal cords under direct vision, positive ETCO2 and breath sounds checked- equal and bilateral Secured at: 21 cm Tube secured with: Tape Dental Injury: Teeth and Oropharynx as per pre-operative assessment

## 2021-05-03 NOTE — ED Triage Notes (Signed)
Patient reports that she has a history of ovarian cancer. Patient states she has been having a temp of 100.3 at home. Patient states she took Tylenol 325 mg at 0700 this AM. T. In triage 98.3 Patient states she has been vomiting today.  Patient also reports that she has a drainage tube due to a fistula.and is having burning. Patient states she was suppose to have a CT scan to ensure the patency of the tube, but unable to keep the contrast down.

## 2021-05-03 NOTE — Anesthesia Postprocedure Evaluation (Signed)
Anesthesia Post Note  Patient: Kashauna Celmer  Procedure(s) Performed: incision and drainage abdominal collection and placement drainage catheter     Patient location during evaluation: PACU Anesthesia Type: General Level of consciousness: awake and alert Pain management: pain level controlled Vital Signs Assessment: post-procedure vital signs reviewed and stable Respiratory status: spontaneous breathing, nonlabored ventilation, respiratory function stable and patient connected to nasal cannula oxygen Cardiovascular status: blood pressure returned to baseline and stable Postop Assessment: no apparent nausea or vomiting Anesthetic complications: no   No notable events documented.  Last Vitals:  Vitals:   05/03/21 1745 05/03/21 1800  BP: 122/77 120/87  Pulse: (!) 107 (!) 106  Resp: (!) 21 19  Temp: 36.8 C 36.8 C  SpO2: 100% 100%    Last Pain:  Vitals:   05/03/21 1800  TempSrc:   PainSc: 3                  Caleigha Zale P Halsey Persaud

## 2021-05-03 NOTE — Transfer of Care (Signed)
Immediate Anesthesia Transfer of Care Note  Patient: Sarah Newman  Procedure(s) Performed: incision and drainage abdominal collection and placement drainage catheter  Patient Location: PACU  Anesthesia Type:General  Level of Consciousness: awake, alert , oriented and patient cooperative  Airway & Oxygen Therapy: Patient Spontanous Breathing and Patient connected to face mask oxygen  Post-op Assessment: Report given to RN and Post -op Vital signs reviewed and stable  Post vital signs: Reviewed and stable  Last Vitals:  Vitals Value Taken Time  BP 122/91 05/03/21 1704  Temp 37.4 C 05/03/21 1704  Pulse 106 05/03/21 1708  Resp 20 05/03/21 1708  SpO2 100 % 05/03/21 1708  Vitals shown include unvalidated device data.  Last Pain:  Vitals:   05/03/21 1518  TempSrc:   PainSc: 5       Patients Stated Pain Goal: 4 (11/46/43 1427)  Complications: No notable events documented.

## 2021-05-03 NOTE — Interval H&P Note (Signed)
History and Physical Interval Note:  05/03/2021 2:33 PM  Sarah Newman  has presented today for surgery, with the diagnosis of abdominal infection.  The various methods of treatment have been discussed with the patient and family. After consideration of risks, benefits and other options for treatment, the patient has consented to  Procedure(s): incision and drainage abdominal collection and placement drainage catheter (N/A) as a surgical intervention.  The patient's history has been reviewed, patient examined, no change in status, stable for surgery.  I have reviewed the patient's chart and labs.  Questions were answered to the patient's satisfaction.     Lafonda Mosses

## 2021-05-03 NOTE — ED Provider Notes (Signed)
[facility] DEPT Provider Note   CSN: [id number] Arrival date & time: 05/03/21  2130     History Chief Complaint  Patient presents with   Fever   Emesis    Sarah Newman is a 44 y.o. female.  45 year old female with history of ovarian cancer presents with several days of constipation as well as bilious emesis.  States she is able to pass some flatus if she really tries.  Denies any fever.  Has been using Tylenol at home for her discomfort.  Called her doctor and was scheduled to have an outpatient CT done but cannot keep down the contrast.  She was then told to come here for further management      Past Medical History:  Diagnosis Date   Anemia due to antineoplastic chemotherapy    Arthritis    Asthma due to seasonal allergies    01-21-2021 per pt seasonal and exercised induced, does not have inhaler, last used one few years   Chronic abdominal pain 01/2021   Chronic nausea    01-21-2021  per pt intermittant nausea and when has abd pain most of time will have vomiting   Family history of breast cancer    Family history of colon cancer    Family history of ovarian cancer    Family history of prostate cancer    History of 2019 novel coronavirus disease (COVID-19) 09/08/2020   positive results in epic, per pt mild symptoms that resolved   History of Helicobacter pylori infection 07/2016   [facility] w/ [facility],  chronic h.pylori w/ gastritis,  treated  (no ulcer)   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;  progression w/ abdominal carcinomatosis   On total parenteral nutrition (TPN) 02/02/2021   infused thru [facility]   Port-A-Cath in place    Steroid-induced diabetes ([place])    followed by dr Alvy Bimler    Patient Active Problem List   Diagnosis Date Noted   Cellulitis and abscess of trunk 04/05/2021    Abdominal fluid collection 04/05/2021   Port or reservoir infection    Sepsis ([place]) 03/16/2021   Candidemia ([place]) 03/16/2021   Staphylococcus epidermidis bacteremia 03/16/2021   Post-operative pain    Fever postop 03/14/2021   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   Postoperative abscess 02/02/2021   Abscess    Intermittent small bowel obstruction due to adhesions ([place]) 01/23/2021   Intra-abdominal adhesions    Nausea without vomiting 01/11/2021   SBO (small bowel obstruction) ([place]) 01/11/2021   Poor dentition 12/10/2020   Pancytopenia, acquired ([place]) 12/10/2020   Allergic reaction 10/15/2020   Physical debility 10/01/2020   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/2020   Constipation 09/20/2020   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   Cancer associated pain 09/14/2020   Lab test positive for detection of COVID-19 virus 09/10/2020   Hypokalemia 08/30/2020   Obesity, Class II, BMI 35-39.9 07/17/2020   Anemia, chronic disease 05/29/2020   UTI (urinary tract infection) 01/09/2020   Dysuria 01/06/2020   Anemia due to antineoplastic chemotherapy 12/05/2019   Gastritis 12/05/2019   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/2019   Weight gain 10/25/2019   Vaginal dryness 10/24/2019   Drug-induced hyperglycemia 10/03/2019   Genetic testing 09/23/2019   Bone pain 09/13/2019   Restless leg 09/13/2019   Elevated liver enzymes 09/13/2019   Family history of ovarian cancer    Family  history of prostate cancer    Family history of colon cancer    Family history of breast cancer    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[place])    Pelvic mass in female 07/11/2019   Obesity 07/11/2019   Cervical high risk HPV (human papillomavirus) test positive 02/17/2017   Cholecystitis 02/16/2015    Past Surgical History:  Procedure Laterality Date   CHOLECYSTECTOMY N/A 02/16/2015   Procedure: LAPAROSCOPIC CHOLECYSTECTOMY WITH INTRAOPERATIVE CHOLANGIOGRAM;  Surgeon: Johnathan Hausen, MD;  Location: [facility]  ORS;  Service: General;  Laterality: N/A;   INCISION AND DRAINAGE ABSCESS N/A 02/02/2021   Procedure: INCISION AND DRAINAGE ABSCESS;  Surgeon: Lahoma Crocker, MD;  Location: [facility] ORS;  Service: Gynecology;  Laterality: N/A;   IR IMAGING GUIDED PORT INSERTION  08/12/2019   IR REMOVAL TUN ACCESS W/ PORT W/O FL MOD SED  03/18/2021   LAPAROSCOPY N/A 01/23/2021   Procedure: LAPAROSCOPY DIAGNOSTIC;  Surgeon: Lafonda Mosses, MD;  Location: [facility];  Service: Gynecology;  Laterality: N/A;   LAPAROTOMY N/A 01/23/2021   Procedure: LAPAROTOMY, ENTEROTOMY REPAIR OF THE BOWEL;  Surgeon: Lafonda Mosses, MD;  Location: [facility];  Service: Gynecology;  Laterality: N/A;   SALPINGOOPHORECTOMY N/A 07/21/2019   Procedure: EXPLORATORY LAPAROTOMY, TOTAL ABDOMINAL HYSTERECTOMY, BILATERAL SALPINGO OOPHORECTOMY, OMENTECTOMY;  Surgeon: Lafonda Mosses, MD;  Location: [facility] ORS;  Service: Gynecology;  Laterality: N/A;   UPPER GASTROINTESTINAL ENDOSCOPY  08/01/2016   by dr Corinda Gubler in [place] N/A 03/11/2021   Procedure: OPENING AND EXPLORATION OF ABDOMINAL EXCISION;  Surgeon: Lafonda Mosses, MD;  Location: [facility];  Service: Gynecology;  Laterality: N/A;     OB History     Gravida  3   Para  3   Term      Preterm      AB      Living  2      SAB      IAB      Ectopic      Multiple      Live Births  2           Family History  Problem Relation Age of Onset   Colon cancer Father 15   Prostate cancer Father 26   Colon cancer Paternal Aunt        dx. early 91s   Ovarian cancer Paternal Grandmother 91   Breast cancer Cousin    Endometriosis Mother    Cancer Paternal Uncle        unknown type, dx. early 46s   Cancer Paternal Uncle 27       unknown type   Esophageal cancer Neg Hx     Social History   Tobacco Use   Smoking status: Never   Smokeless tobacco: Never  Vaping Use   Vaping Use: Never used   Substance Use Topics   Alcohol use: Yes    Comment: Social drinker   Drug use: Never    Home Medications Prior to Admission medications   Medication Sig Start Date End Date Taking? Authorizing Provider  Accu-Chek Softclix Lancets lancets Use as instructed 02/10/21   Lafonda Mosses, MD  acetaminophen (TYLENOL) 325 MG tablet Take 650 mg by mouth every 6 (six) hours as needed for mild pain, fever or headache.    [provider]  acetaminophen (TYLENOL) 650 MG suppository Place 1 suppository (650 mg total) rectally every 6 (six) hours as needed for moderate pain. 02/09/21  Lafonda Mosses, MD  cephALEXin (KEFLEX) 500 MG capsule Take 1 capsule by mouth 4 times daily for 10 days. 04/10/21 05/06/21  Lafonda Mosses, MD  fluconazole (DIFLUCAN) 200 MG tablet Take 2 tablets by mouth daily. 03/22/21   Joylene John D, NP  glucose blood test strip Use as instructed 02/10/21   Lafonda Mosses, MD  lidocaine-prilocaine (EMLA) cream APPLY TO THE AFFECTED AREA AS DIRECTED TO ACCESS PORT 10/22/20 10/22/21  Heath Lark, MD  LORazepam (ATIVAN) 0.5 MG tablet Take 1 tablet by mouth every 8 hours as needed for anxiety/insomnia. Do not take and drive. 03/12/21   Joylene John D, NP  Multiple Vitamin (MULTIVITAMIN WITH MINERALS) TABS tablet Take 1 tablet by mouth daily.    [provider]  omeprazole (PRILOSEC) 40 MG capsule Take 1 capsule (40 mg total) by mouth daily. 02/09/21   Lafonda Mosses, MD  ondansetron (ZOFRAN ODT) 4 MG disintegrating tablet Take 1 tablet (4 mg total) by mouth every 4 (four) hours as needed for up to 20 doses for nausea or vomiting. 02/22/21   Lafonda Mosses, MD  [place] 1 each by Does not apply route as needed Kellie Simmering [phone]). Kellie Simmering #[phone] Patient not taking: Reported on 04/24/2021 04/10/21   Lafonda Mosses, MD  oxyCODONE (OXY IR/ROXICODONE) 5 MG immediate release tablet Take 1 tablet (5 mg total) by mouth every 4 (four) hours as needed  for severe pain. Do not take and drive Patient not taking: Reported on 04/24/2021 04/09/21   Joylene John D, NP  promethazine (PHENERGAN) 12.5 MG suppository Place 1 suppository (12.5 mg total) rectally every 8 (eight) hours as needed for up to 30 doses for nausea or vomiting. 03/15/21   Lucrezia Starch, MD    Allergies    Carboplatin  Review of Systems   Review of Systems  All other systems reviewed and are negative.  Physical Exam Updated Vital Signs BP 121/84 (BP Location: Right Arm)   Pulse (!) 107   Temp 98.2 F (36.8 C) (Oral)   Resp 18   Ht 1.575 m (5\' 2" )   Wt 79.8 kg   LMP 07/05/2019   SpO2 97%   BMI 32.19 kg/m   Physical Exam Vitals and nursing note reviewed.  Constitutional:      General: She is not in acute distress.    Appearance: Normal appearance. She is well-developed. She is not toxic-appearing.  HENT:     Head: Normocephalic and atraumatic.  Eyes:     General: Lids are normal.     Conjunctiva/sclera: Conjunctivae normal.     Pupils: Pupils are equal, round, and reactive to light.  Neck:     Thyroid: No thyroid mass.     Trachea: No tracheal deviation.  Cardiovascular:     Rate and Rhythm: Normal rate and regular rhythm.     Heart sounds: Normal heart sounds. No murmur heard.   No gallop.  Pulmonary:     Effort: Pulmonary effort is normal. No respiratory distress.     Breath sounds: Normal breath sounds. No stridor. No decreased breath sounds, wheezing, rhonchi or rales.  Abdominal:     General: There is distension.     Palpations: Abdomen is soft.     Tenderness: There is generalized abdominal tenderness. There is guarding. There is no rebound.    Musculoskeletal:        General: No tenderness. Normal range of motion.     Cervical back: Normal range  of motion and neck supple.  Skin:    General: Skin is warm and dry.     Findings: No abrasion or rash.  Neurological:     Mental Status: She is alert and oriented to person, place, and time.  Mental status is at baseline.     GCS: GCS eye subscore is 4. GCS verbal subscore is 5. GCS motor subscore is 6.     Cranial Nerves: Cranial nerves are intact. No cranial nerve deficit.     Sensory: No sensory deficit.     Motor: Motor function is intact.  Psychiatric:        Attention and Perception: Attention normal.        Speech: Speech normal.        Behavior: Behavior normal.    ED Results / Procedures / Treatments   Labs (all labs ordered are listed, but only abnormal results are displayed) Labs Reviewed  LIPASE, BLOOD  COMPREHENSIVE METABOLIC PANEL  CBC  URINALYSIS, ROUTINE W REFLEX MICROSCOPIC    EKG None  Radiology No results found.  Procedures Procedures   Medications Ordered in ED Medications  lactated ringers bolus 1,000 mL (has no administration in time range)  lactated ringers infusion (has no administration in time range)    ED Course  I have reviewed the triage vital signs and the nursing notes.  Pertinent labs & imaging results that were available during my care of the patient were reviewed by me and considered in my medical decision making (see chart for details).    MDM Rules/Calculators/A&P                           Patient medicated for pain.  Abdominal CT findings noted.  Received communication from Gyn/onc they will contact patient 2 OR today Final Clinical Impression(s) / ED Diagnoses Final diagnoses:  None    Rx / DC Orders ED Discharge Orders     None        Lacretia Leigh, MD 05/03/21 1152

## 2021-05-03 NOTE — H&P (Signed)
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 There is a 16 cm complex cystic mass in the pelvis containing thickened septations located near midline, abutting the superior aspect of the uterus. I suspect this mass is adnexal/ovarian in origin rather than uterine in origin. Specifically, I suspect the mass likely arises from the left ovary/adnexa and is most consistent with a neoplasm. Malignancy is certainly not excluded on this study. Recommend a pelvic ultrasound for further evaluation. 2. The rounded more solid-appearing structure in the right side of the pelvis is probably the right ovary. Recommend attention on ultrasound. 3. The small amount of fluid in the pelvis is likely reactive to the complex cystic mass likely rising from the left ovary/adnexa. 4. No cause for blood in vomit or black stools identified. No convincing evidence of a perforated or inflamed gastric or duodenal ulcer. 5. Hepatic steatosis.   07/02/2019 Imaging   MRI pelvis 1. 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 There are 2 enhancing nodules within the central upper abdomen raising the possibility of peritoneal carcinomatosis. Additionally, there is a small amount fluid within the pelvis. Malignant ascites not excluded. 2. Fibroid uterus. 3. Hepatic steatosis.   07/02/2019 Imaging   US pelvis 1. There is a large mass in the pelvis also seen on CT imaging. A separate left ovary is not visualized. 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 The mass is suspicious for malignancy. Recommend gynecologic consultation. 2. The right ovary demonstrates an irregular ill-defined hypoechoic hypervascular region centrally.  The findings are nonspecific in the right ovary. However, given the apparent left ovarian neoplasm suspicious for malignancy, recommend an MRI to further assess the right ovary. 3. Uterine fibroid measuring 3.6 cm.     07/21/2019 Pathology Results   FINAL MICROSCOPIC DIAGNOSIS: A. 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 - See oncology table. B. OMENTUM, RESECTION: - Omental lymph node with metastatic carcinoma (1/1). - Omental adipose tissue with foci of inflammation and reactive mesothelial changes. C. FALLOPIAN TUBE, LEFT, RESECTION: - Fallopian tube with focal, mild inflammation and fibrosis. - No tumor identified. D. UTERUS, CERVIX, RIGHT TUBE AND OVARY: - Cervix Nabothian cyst and squamous metaplasia. - Endometrium Proliferative. No hyperplasia or carcinoma. - Myometrium Leiomyomata. No malignancy. -Right ovary Poorly differentiated carcinoma, 4.8 cm. See oncology table and comment. -Right Fallopian tube Benign paratubal cyst. No endometriosis or malignancy. ONCOLOGY TABLE: OVARY or FALLOPIAN TUBE or PRIMARY PERITONEUM: Procedure: Bilateral f-oophorectomy, hysterectomy and omentectomy. Specimen Integrity: Intact Tumor Site: Left ovary and right ovary. See comment. Ovarian Surface Involvement: Not identified. Fallopian Tube Surface Involvement: Not identified. Tumor Size: Left ovary: 18.6 x 16.0 x 7.2 cm. Right ovary: 4.8 x 3.2 x 3.2 cm. Histologic Type: Clear cell carcinoma. See comment. Histologic Grade: High-grade. Other Tissue/ Organ Involvement: Omental lymph node with metastatic carcinoma. Peritoneal/Ascitic Fluid: Pending. Treatment Effect: No known presurgical therapy. Pathologic Stage Classification (pTNM, AJCC 8th Edition): pT3a, pN1a. Representative Tumor Block: A2, A3, A4, A5 A6, A7 and A8. Comment(s): The left ovarian tumor is 18.6 cm in greatest dimension and has features of clear cell carcinoma. The right ovarian  tumor is 4.8 cm in greatest dimension and is poorly  differentiated with focal features consistent with clear cell carcinoma. The pattern of involvement suggests that the right ovarian tumor is likely metastatic from the larger left ovarian clear cell carcinoma. Sections of the omentum show a lymph node with metastatic carcinoma. Sections of the remainder of the omentum do not show involvement by carcinoma.   07/21/2019 Surgery   Procedure(s) Performed: Exploratory laparotomy with radical tumor debulking including total hysterectomy, bilateral salpingo-oophorectomy, infra-gastric omentectomy, and washings.   Surgeon: Jeral Pinch, MD    Operative Findings: On EUA, mobile uterus and ovarian mass, situated out of the pelvis, spanning 6-8 cm above the umbilicus.  On intra-abdominal entry, inflamed omentum adherent to much of the 16 cm left ovarian mass, adhesions easily lysed bluntly.  No nodularity of the ovarian mass however on delivery of the mass through the midline incision, there was Intra-Op rupture of one of the smaller cystic components with drainage of clear brown-tinged fluid.  Grossly normal-appearing left fallopian tube as well as right tube.  Right ovary mildly enlarged measuring approximately 4 cm and firm/nodular, suspicious for tumor involvement.  Uterus 8-10 cm with 4 cm anterior mid body fibroid.  Some very small volume miliary disease along right pelvic sidewall, removed with uterine specimen.  Evidence of small diverticular disease.  Small bowel normal appearing although multiple loops of bowel adherent to each other, especially by mesentery, and an inflammatory manner.  An approximately 2 x 2 centimeter nodule suspicious for tumor implant was found in the omentum just inferior to the greater curvature of the stomach.  Additional small (less than 5 mm) implants noted on the stomach and posterior aspect of the left lobe of the liver.  Otherwise the liver surface and diaphragm were  smooth.   08/03/2019 Cancer Staging   Staging form: Ovary, Fallopian Tube, and Primary Peritoneal Carcinoma, AJCC 8th Edition - Pathologic: Stage IIIA2 (pT3a, pN1, cM0) - Signed by Heath Lark, MD on 08/03/2019   08/12/2019 Procedure   Placement of single lumen port a cath via right internal jugular vein. The catheter tip lies at the cavo-atrial junction. A power injectable port a cath was placed and is ready for immediate use.   08/22/2019 -  Chemotherapy   The patient had carboplatin and taxol for chemotherapy treatment.     09/23/2019 Genetic Testing   Negative genetic testing:  No pathogenic variants detected on the [facility] TumorNext-HRD+CancerNext paired germline and somatic genetic test. The report date is 09/23/2019.  The TumorNext-HRD+CancerNext test offered by [facility] includes paired germline and tumor analyses of 11 genes associated with homologous recombination repair (ATM, BARD1, BRIP1, CHEK2, MRE11A, NBN, PALB2, RAD51C, RAD51D, BRCA1, BRCA2) plus germline analyses of 26 additional genes associated with hereditary cancer (APC, AXIN2, BMPR1A, CDH1, CDK4, CDKN2A, DICER1, HOXB13, EPCAM, GREM1, MLH1, MSH2, MSH3, MSH6, MUTYH, NF1, NTHL1, PMS2, POLD1, POLE, PTEN, RECQL, SMAD4, SMARCA4, STK11, and TP53).   11/14/2019 Tumor Marker   Patient's tumor was tested for the following markers: CA-125 Results of the tumor marker test revealed 10.2.   12/05/2019 Tumor Marker   Patient's tumor was tested for the following markers: CA-125 Results of the tumor marker test revealed 9.9   01/05/2020 Imaging   No specific findings of active malignancy. Interval resection of the pelvic mass. Subtle nodularity along the right side of the vaginal cuff merits surveillance.     01/05/2020 Tumor Marker   Patient's tumor was tested for the following markers: CA-125 Results of the tumor marker test revealed 9.7   02/17/2020 Tumor Marker  Patient's tumor was tested for the following markers: CA-125 Results of the  tumor marker test revealed 9.6   04/05/2020 Imaging   1. There is some minimal stranding along the mesentery and omentum without overt omental caking or well-defined nodularity. Faint bandlike density along the anterior peritoneal reflection in the pelvis, slightly more notable than on prior. Overall the appearance is not considered specific for recurrence but given the slight increase in prominence from 01/05/2020, would encourage careful surveillance by tumor markers and imaging. 2. Mild lower lumbar degenerative facet arthropathy.   04/05/2020 Tumor Marker   Patient's tumor was tested for the following markers: CA-125 Results of the tumor marker test revealed 11   05/29/2020 Tumor Marker   Patient's tumor was tested for the following markers: CA-125. Results of the tumor marker test revealed 17.   07/04/2020 Tumor Marker   Patient's tumor was tested for the following markers: CA-125. Results of the tumor marker test revealed 20.1   07/16/2020 Imaging   Stable mild stranding throughout mesenteric and omental fat, without evidence of discrete masses or ascites. No evidence of new or progressive disease.   08/29/2020 Tumor Marker   Patient's tumor was tested for the following markers: CA-125 Results of the tumor marker test revealed 32.7.   09/07/2020 Imaging   1. Interval progression of peritoneal disease within the abdomen and pelvis. Tumor predominantly involves the serosal surface of the large and small bowel loops. No signs of bowel obstruction identified at this time. 2. No evidence for metastatic disease to the chest.   09/19/2020 Imaging   1. No acute findings identified within the abdomen or pelvis. No evidence for bowel obstruction. 2. Similar appearance of soft tissue thickening along the peritoneal reflections within the abdomen and pelvis compatible with known peritoneal disease. Recently characterized increased soft tissue along the surface of the jejunal bowel loops and  tethering of pelvic small bowel loops is difficult to quantify (and compared with previous exam) due to lack of IV contrast material.     09/20/2020 -  Chemotherapy    Patient is on Treatment Plan: OVARIAN RECURRENT 3RD LINE CARBOPLATIN D1 / GEMCITABINE D1,8 (4/800) Q21D  Carboplatin was discontinued after 10/15/2020 due to allergic reaction      10/01/2020 Tumor Marker   Patient's tumor was tested for the following markers: CA-125. Results of the tumor marker test revealed 37.2.   10/15/2020 Tumor Marker   Patient's tumor was tested for the following markers: CA-125. Results of the tumor marker test revealed 48   10/22/2020 Tumor Marker   Patient's tumor was tested for the following markers: CA-125 Results of the tumor marker test revealed 28.5   11/05/2020 Tumor Marker   Patient's tumor was tested for the following markers: CA-125 Results of the tumor marker test revealed 18.9   12/17/2020 Imaging   1. No significant change in soft tissue thickening and nodularity in the low pelvis, with minimal thickening of the peritoneum throughout the abdomen. 2. Similar appearance of soft tissue thickening about the mid small bowel in the central, ventral abdomen. 3. Findings are consistent with stable peritoneal metastatic disease. 4. Status post hysterectomy and cholecystectomy.   12/18/2020 Tumor Marker   Patient's tumor was tested for the following markers: CA-125 Results of the tumor marker test revealed 10.8   01/11/2021 Imaging   Findings concerning for small bowel obstruction.     01/11/2021 Tumor Marker   Patient's tumor was tested for the following markers: CA-125. Results of the tumor marker  test revealed 10.4.   01/11/2021 Imaging   CT abdomen and pelvis Mildly prominent proximal loops of small bowel although no true transition zone is seen in the distal small bowel appears within normal limits. These changes could represent early partial small bowel obstruction. The overall  appearance however is similar to that seen on prior CT from May of 2022.   Mild peritoneal thickening similar to that seen on the prior exam.   Fatty liver.   No other focal abnormality is noted.     Interval History: Patient presented this morning to the emergency department given ongoing and worsening symptoms including nausea, abdominal pain, low-grade temp, and emesis (as of this morning).  She had been doing very well until last weekend.  Since that time, she denies bowel function including flatus or bowel movement.  She has been taking MiraLAX but really has not tolerated more than liquids for the last 5 days.  Last night and this morning, she has had a low-grade temp up to 100.3, has taken Tylenol for pain which is also helped with her fever.  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  Plan had tentatively been for removal of her mushroom catheter depending on CT findings.  Past Medical/Surgical History: Past Medical History:  Diagnosis Date   Anemia due to antineoplastic chemotherapy    Arthritis    Asthma due to seasonal allergies    01-21-2021 per pt seasonal and exercised induced, does not have inhaler, last used one few years   Chronic abdominal pain 01/2021   Chronic nausea    01-21-2021  per pt intermittant nausea and when has abd pain most of time will have vomiting   Family history of breast cancer    Family history of colon cancer    Family history of ovarian cancer    Family history of prostate cancer    History of 2019 novel coronavirus disease (COVID-19) 09/08/2020   positive results in epic, per pt mild symptoms that resolved   History of Helicobacter pylori infection 07/2016   E[facility] w/ bx's by [facility],  chronic h.pylori w/ gastritis,  treated  (no ulcer)   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;  progression w/ abdominal carcinomatosis   On total parenteral nutrition (TPN) 02/02/2021   infused thru [facility] in place    Steroid-induced diabetes ([place])    followed by dr Alvy Bimler    Past Surgical History:  Procedure Laterality Date   CHOLECYSTECTOMY N/A 02/16/2015   Procedure: LAPAROSCOPIC CHOLECYSTECTOMY WITH INTRAOPERATIVE CHOLANGIOGRAM;  Surgeon: Johnathan Hausen, MD;  Location: [facility];  Service: General;  Laterality: N/A;   INCISION AND DRAINAGE ABSCESS N/A 02/02/2021   Procedure: INCISION AND DRAINAGE ABSCESS;  Surgeon: Lahoma Crocker, MD;  Location: [facility];  Service: Gynecology;  Laterality: N/A;   IR IMAGING GUIDED PORT INSERTION  08/12/2019   IR REMOVAL TUN ACCESS W/ PORT W/O FL MOD SED  03/18/2021   LAPAROSCOPY N/A 01/23/2021   Procedure: LAPAROSCOPY DIAGNOSTIC;  Surgeon: Lafonda Mosses, MD;  Location: [facility];  Service: Gynecology;  Laterality: N/A;   LAPAROTOMY N/A 01/23/2021   Procedure: LAPAROTOMY, ENTEROTOMY REPAIR OF THE BOWEL;  Surgeon: Lafonda Mosses, MD;  Location: [facility];  Service: Gynecology;  Laterality: N/A;   SALPINGOOPHORECTOMY N/A 07/21/2019   Procedure: EXPLORATORY LAPAROTOMY, TOTAL ABDOMINAL HYSTERECTOMY, BILATERAL SALPINGO OOPHORECTOMY, OMENTECTOMY;  Surgeon: Lafonda Mosses, MD;  Location: [facility];  Service: Gynecology;  Laterality: N/A;   UPPER GASTROINTESTINAL ENDOSCOPY  08/01/2016   by dr Corinda Gubler in [place] N/A 03/11/2021   Procedure: OPENING AND EXPLORATION OF ABDOMINAL EXCISION;  Surgeon: Lafonda Mosses, MD;  Location: [facility];  Service: Gynecology;  Laterality: N/A;    Family History  Problem Relation Age of Onset   Colon cancer Father 44   Prostate cancer Father 13   Colon cancer Paternal Aunt        dx. early 79s   Ovarian cancer Paternal Grandmother 64   Breast cancer Cousin     Endometriosis Mother    Cancer Paternal Uncle        unknown type, dx. early 27s   Cancer Paternal Uncle 64       unknown type   Esophageal cancer Neg Hx     Social History   Socioeconomic History   Marital status: Married    Spouse name: Herbie [place]   Number of children: 2   Years of education: Not on file   Highest education level: Not on file  Occupational History   Not on file  Tobacco Use   Smoking status: Never   Smokeless tobacco: Never  Vaping Use   Vaping Use: Never used  Substance and Sexual Activity   Alcohol use: Yes    Comment: Social drinker   Drug use: Never   Sexual activity: Yes    Birth control/protection: Surgical  Other Topics Concern   Not on file  Social History Narrative   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  Newly married as of October 2020.   2 boys, age 75 and 67   Social Determinants of Health   Emergency planning/management officer Strain: Not on [employer] Insecurity: Not on file  Transportation Needs: Not on file  Physical Activity: Not on file  Stress: Not on file  Social Connections: Not on file    Current Medications:  Current Facility-Administered Medications:    [MAR Hold] ceFAZolin (ANCEF) IVPB 2g/100 mL premix, 2 g, Intravenous, On Call to OR, Joylene John D, NP   lactated ringers infusion, , Intravenous, Continuous, Lacretia Leigh, MD, Last Rate: 125 mL/hr at 05/03/21 1345, Continued from Pre-op at 05/03/21 1345   lactated ringers infusion, , Intravenous, Continuous, Woodrum, Chelsey L, MD, Last Rate: 75 mL/hr at 05/03/21 1405, New Bag at 05/03/21 1405  Review of Systems: Denies hearing loss, neck lumps or masses, mouth sores, ringing in ears or voice changes. Denies cough or wheezing.  Denies shortness of breath. Denies chest pain or palpitations.  Denies blood in stools, diarrhea. Denies pain with intercourse, dysuria, frequency, hematuria or incontinence. Denies hot flashes, pelvic pain, vaginal bleeding or vaginal discharge.    Denies joint pain, back pain or muscle pain/cramps. Denies itching, rash, or wounds. Denies dizziness, headaches, numbness or seizures. Denies swollen lymph nodes or glands, denies easy bruising or bleeding.  Physical Exam: BP (!) 143/90   Pulse 90   Temp 98.9 F (37.2 C) (Oral)   Resp 16   Ht '5\' 2"'  (1.575 m)   Wt 176 lb (79.8 kg)   LMP 07/05/2019   SpO2 100%   BMI 32.19 kg/m  General: Alert, oriented, no acute distress. HEENT: Normocephalic, atraumatic, sclera anicteric. Chest: Clear to auscultation bilaterally.  No wheezes or rhonchi. Cardiovascular: Regular rate and rhythm, no murmurs. Abdomen: Obese, soft, nontender.  Hypoactive bowel sounds.  Mushroom catheter in place, no drainage noted.  Patient has an approximately 6 x 6 cm moderately tender area of fluctuance just superior to the mushroom catheter. Extremities: Grossly normal range of motion.  Warm, well perfused.  No edema bilaterally. Skin: No rashes or lesions noted. Lymphatics: No cervical, supraclavicular, or inguinal adenopathy. GU: Deferred.  Laboratory & Radiologic Studies: CBC    Component Value Date/Time   WBC 11.2 (H) 05/03/2021 1002   RBC 4.17 05/03/2021 1002   HGB 10.9 (L) 05/03/2021 1002   HGB 10.0 (L) 03/14/2021 1301   HCT 34.7 (L) 05/03/2021 1002   PLT 289 05/03/2021 1002   PLT 223 03/14/2021 1301   MCV 83.2 05/03/2021 1002   MCH 26.1 05/03/2021 1002   MCHC 31.4 05/03/2021 1002   RDW 14.8 05/03/2021 1002   LYMPHSABS 2.6 03/19/2021 0321   MONOABS 0.5 03/19/2021 0321   EOSABS 0.2 03/19/2021 0321   BASOSABS 0.0 03/19/2021 0321   CMP Latest Ref Rng & Units 05/03/2021 04/05/2021 03/21/2021  Glucose 70 - 99 mg/dL 110(H) 120(H) 125(H)  BUN 6 - 20 mg/dL 9 6 <5(L)  Creatinine 0.44 - 1.00 mg/dL 0.59 0.50 0.64  Sodium 135 - 145 mmol/L 138 140 143  Potassium 3.5 - 5.1 mmol/L 3.6 3.8 3.5  Chloride 98 - 111 mmol/L 99 103 105  CO2 22 - 32 mmol/L '26 25 27  ' Calcium 8.9 - 10.3 mg/dL 9.4 9.7 9.1  Total  Protein 6.5 - 8.1 g/dL 8.8(H) - -  Total Bilirubin 0.3 - 1.2 mg/dL 0.8 - -  Alkaline Phos 38 - 126 U/L 94 - -  AST 15 - 41 U/L 22 - -  ALT 0 - 44 U/L 13 - -   CT A/P: IMPRESSION: 1. A partial small bowel obstruction is suspected involving several loops of proximal small bowel. The transition to decreased caliber mid and distal small bowel is point identified within the midline of the abdomen where there is convergence of multiple loops of small bowel along the undersurface of the ventral abdominal wall. 2. At the previous enterocutaneous fistula site within the superficial soft tissues of the ventral abdominal wall there is a complex multiloculated air and fluid collection. Compared with 04/04/2021 there is been mild decrease in total volume of this collection. Interval placement of a drainage catheter but this does not appear to reside within the collection and may need to be repositioned. 3. Diffuse soft tissue stranding within the small bowel mesenteric fat is favored to represent venous congestion. 4. Again seen are multiple loops of small bowel within the central aspect of the lower abdomen which exhibit mild wall thickening. Although this may reflect inflammatory changes, serosal involvement by tumor can not be excluded in this patient who has a history of ovarian cancer.  Assessment & Plan: Sarah Newman is a 45 y.o. 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  I discussed CT findings with the patient and her partner who was at bedside.  Discussed imaging somewhat limited because no oral contrast was able to be given due to her symptoms of nausea and emesis.  Unfortunately, despite the  mushroom catheter still being  in place, it appears to be superficial and not within the abscess pocket.  I recommend going to the operating room today for removal of the mushroom catheter as well as exploration of the abscess, washout, and likely drain placement.  I spoke with interventional radiology and have a multipurpose drain that I will try to place.  We will send cultures from the superficial abscess to help guide antibiotic therapy.  In terms of her partial small bowel obstruction, her symptoms are much improved now after receiving IV Reglan.  We discussed the benefits of an NG tube for decompression.  I think it is reasonable to hold off placement of an NG tube as long as she does not have persistent nausea or additional episodes of emesis.  I will discuss with her again prior to surgery as we could place one while she is under anesthesia.  I recommend bowel rest with IV fluids and conservative management.  Jeral Pinch, MD  Division of Gynecologic Oncology

## 2021-05-03 NOTE — Op Note (Signed)
OPERATIVE NOTE  PATIENT: Sarah Newman DATE: 05/03/2021  Preoperative Diagnosis: Abdominal wall fluid collection, suspected abscess  Postoperative Diagnosis: same, purulent fluid noted within collection  Surgery: Removal of mushroom catheter  Surgeons:  Valarie Cones MD  Assistant: Elinor Parkinson, NP  Anesthesia: General   Estimated blood loss: 92ml  IVF:  see I&O flowsheet   Urine output: n/a   Complications: None apparent  Pathology: anaerobic and aerobic cultures sent, no path  Operative findings: 8x8 abdominal wall abscess just under site of mushroom catheter.   Procedure: The patient was identified in the preoperative holding area. Informed consent was signed on the chart. Patient was seen history was reviewed and exam was performed.   The patient was then taken to the operating room and placed in the supine position with SCD hose on. General anesthesia was then induced without difficulty. She was then placed in supine position. Patient was prepped and draped in the usual sterile fashion.  Timeout was performed the patient, procedure, antibiotic, allergy, and length of procedure.   Using a scalpel, the incision around the mushroom catheter was extended 4cm. The incision was opened bluntly and with a tonsil until the abscess cavity was entered with drainage of purulent and foul smelling fluid. This was swabbed for culture. The mushroom catheter was freed from surrounding subcutaneous tissue and removed. The incision was explored manually and a digit was used to break up all pockets where fluctuance was palpated. The incision was then copiously irrigated until no purulent fluid noted.   A 68F multipurpose catheter was then inserted after a stab incision was made 8cm below the incision. This was placed with the help of a guidewire into the inferior aspect of the incision. The drain with suture to the skin with a drain stitch and secured using a stat lock.  The incision  was made hemostatic with monopolar electrocautery and packed with 2in iodoform guaze. It was then dressed with ABD pad and tape.  All instrument, suture, laparotomy, Ray-Tec, and needle counts were correct x2. The patient tolerated the procedure well and was taken recovery room in stable condition.   Lafonda Mosses, MD

## 2021-05-03 NOTE — Progress Notes (Signed)
Tried to call patient's partner for update after surgery. No answer, voicemail full.  Jeral Pinch MD Gynecologic Oncology

## 2021-05-03 NOTE — Telephone Encounter (Signed)
Received call from [facility], she states she is in pain and having trouble walking. Patient unsure if she should come to appointment or go to the ER. Advised patient to go to ER. Patient verbalized understanding. MD notified.

## 2021-05-04 ENCOUNTER — Inpatient Hospital Stay (HOSPITAL_COMMUNITY): Payer: Medicaid Other

## 2021-05-04 LAB — BASIC METABOLIC PANEL
Anion gap: 10 (ref 5–15)
BUN: <5 mg/dL — ABNORMAL LOW (ref 6–20)
CO2: 27 mmol/L (ref 22–32)
Calcium: 9.4 mg/dL (ref 8.9–10.3)
Chloride: 104 mmol/L (ref 98–111)
Creatinine, Ser: 0.35 mg/dL — ABNORMAL LOW (ref 0.44–1.00)
GFR, Estimated: >60 mL/min (ref >60)
Glucose, Bld: 153 mg/dL — ABNORMAL HIGH (ref 70–99)
Potassium: 3.7 mmol/L (ref 3.5–5.1)
Sodium: 141 mmol/L (ref 135–145)

## 2021-05-04 LAB — CBC WITH DIFFERENTIAL/PLATELET
Abs Immature Granulocytes: 0.04 10*3/uL (ref 0.00–0.07)
Basophils Absolute: 0 10*3/uL (ref 0.0–0.1)
Basophils Relative: 0 %
Eosinophils Absolute: 0 10*3/uL (ref 0.0–0.5)
Eosinophils Relative: 0 %
HCT: 29 % — ABNORMAL LOW (ref 36.0–46.0)
Hemoglobin: 9.2 g/dL — ABNORMAL LOW (ref 12.0–15.0)
Immature Granulocytes: 0 %
Lymphocytes Relative: 8 %
Lymphs Abs: 1 10*3/uL (ref 0.7–4.0)
MCH: 26.1 pg (ref 26.0–34.0)
MCHC: 31.7 g/dL (ref 30.0–36.0)
MCV: 82.2 fL (ref 80.0–100.0)
Monocytes Absolute: 0.5 10*3/uL (ref 0.1–1.0)
Monocytes Relative: 4 %
Neutro Abs: 10.7 10*3/uL — ABNORMAL HIGH (ref 1.7–7.7)
Neutrophils Relative %: 88 %
Platelets: 264 10*3/uL (ref 150–400)
RBC: 3.53 MIL/uL — ABNORMAL LOW (ref 3.87–5.11)
RDW: 14.6 % (ref 11.5–15.5)
WBC: 12.1 10*3/uL — ABNORMAL HIGH (ref 4.0–10.5)
nRBC: 0 % (ref 0.0–0.2)

## 2021-05-04 MED ORDER — MENTHOL 3 MG MT LOZG
1.0000 | LOZENGE | OROMUCOSAL | Status: DC | PRN
  Administered 2021-05-04: 3 mg via ORAL
  Filled 2021-05-04: qty 9

## 2021-05-04 MED ORDER — SODIUM CHLORIDE 0.9 % IV SOLN
2.0000 g | Freq: Three times a day (TID) | INTRAVENOUS | Status: DC
  Administered 2021-05-04 – 2021-05-06 (×5): 2 g via INTRAVENOUS
  Filled 2021-05-04 (×6): qty 2

## 2021-05-04 MED ORDER — CALCIUM CARBONATE ANTACID 500 MG PO CHEW
1.0000 | CHEWABLE_TABLET | Freq: Three times a day (TID) | ORAL | Status: DC
  Administered 2021-05-04 – 2021-05-06 (×6): 200 mg via ORAL
  Filled 2021-05-04 (×7): qty 1

## 2021-05-04 NOTE — H&P (Signed)
PHARMACY NOTE:  ANTIMICROBIAL RENAL DOSAGE ADJUSTMENT  Current antimicrobial regimen includes a mismatch between antimicrobial dosage and estimated renal function.  As per policy approved by the Pharmacy & Therapeutics and Medical Executive Committees, the antimicrobial dosage will be adjusted accordingly.  Current antimicrobial dosage:  cefepime 1 g iv q 8 hours  Indication: subcutaneous abscess   Renal Function:  Estimated Creatinine Clearance: 87.8 mL/min (A) (by C-G formula based on SCr of 0.35 mg/dL (L)). []      On intermittent HD, scheduled: []      On CRRT    Antimicrobial dosage has been changed to:  cefepime 2 g iv q 8 hours  Additional comments:   Thank you for allowing pharmacy to be a part of this patient's care.  Napoleon Form, [facility] 05/04/2021 2:48 PM

## 2021-05-04 NOTE — Progress Notes (Signed)
1 Day Post-Op Procedure(s) (LRB): incision and drainage abdominal collection and placement drainage catheter (N/A)  Subjective: Patient reports feeling much better.  Has some incisional pain but reports resolution of the sharp abdominal pain that she had developed over the last couple of days.  Quite bothered by NG tube.  Denies nausea or emesis but is spitting up a lot secondary to gag reflex and discomfort with NG tube.  Voiding freely.  She reports large amount of flatus starting early this morning, had very small bowel movement.  Feeling hungry.  Ambulating without difficulty.  Objective: Vital signs in last 24 hours: Temp:  [97.8 F (36.6 C)-99.3 F (37.4 C)] 97.8 F (36.6 C) (10/01 0921) Pulse Rate:  [71-108] 71 (10/01 0921) Resp:  [14-26] 17 (10/01 0921) BP: (108-143)/(77-110) 124/94 (10/01 0921) SpO2:  [96 %-100 %] 97 % (10/01 0921) Weight:  [175 lb 14.8 oz (79.8 kg)] 175 lb 14.8 oz (79.8 kg) (09/30 1407) Last BM Date: 04/28/21  Intake/Output from previous day: 09/30 0701 - 10/01 0700 In: 3673 [I.V.:2674; IV Piggyback:999] Out: 125 [Emesis/NG output:100; Drains:10; Blood:15]  Physical Examination: General: No acute distress, alert and oriented HEENT: Atraumatic, normocephalic Cardiovascular: Regular rate and rhythm, no murmurs, rubs, or gallops Pulmonary: Lungs are clear to auscultation bilaterally, no wheezes or rhonchi Abdomen: Soft, mildly distended, nontender to palpation.  Mildly hypoactive bowel sounds.  Packing removed, wound irrigated and explored gently with a digit.  No purulent fluid noted.  Wound repacked loosely with 1 inch iodoform packing.  Minimal serosanguineous fluid noted in multipurpose drain. Remedies: Warm and well perfused, no edema or calf tenderness to palpation.  Labs: WBC/Hgb/Hct/Plts:  12.1/9.2/29.0/264 (10/01 2376) BUN/Cr/glu/ALT/AST/amyl/lip:  <5/0.35/--/--/--/--/-- (10/01 0442)  Blood cultures 10/1: in process Wound culture 9,30:  Gram-negative rods, white blood cells.  Culture still pending.  Assessment:  45 y.o. s/p Procedure(s): incision and drainage abdominal collection and placement drainage catheter: Meeting early postoperative milestones.  Some return of bowel function.  Pain: Well controlled on IV medication.  Difficult improvement in abdominal pain after surgery.  I spoke with the patient by phone last night and we reviewed again today findings at the time of surgery.  I reviewed incision that was made as well as drain placement.  Until incision starts to heal, there likely will be a lot of output from the drain.  Drain could be useful if future imaging shows recollection of an abscess as it could be manipulated by interventional radiology into a new pocket.  ID: Purulent fluid noted on incision and drainage yesterday of subcutaneous abscess.  This was explored and irrigated copiously.  Culture showing gram-negative rods and abundant white blood cells.  Awaiting speciation as well as sensitivities.  Patient is currently on cefepime based on last culture sensitivities.  GI: Patient admitted with clinical and radiographic signs of a partial small bowel obstruction.  She is now endorsing flatus and had small bowel movement this morning.  Additionally, she feels hungry.  She has some bowel sounds on exam.  We will plan to do a clamp trial for 4 hours.  If patient has minimal output when she has had backed up to suction and is not having any nausea, NG tube can be removed and will advance to clears.  FEN: Continue on maintenance fluid, discussed with patient that she can have small amount of ice chips.  Prophylaxis: Lovenox.  Plan: If tolerates NG tube clamp without symptoms of minimal residual once reattached to suction, will plan to remove NG tube  tonight and advance to clears. Dispo:  Discharge plan to include : Possible home health. The patient is to be discharged to home. Anticipate discharge early to mid next  week.   LOS: 1 day    Sarah Newman 05/04/2021, 11:46 AM

## 2021-05-05 LAB — COMPREHENSIVE METABOLIC PANEL
ALT: 18 U/L (ref 0–44)
AST: 25 U/L (ref 15–41)
Albumin: 3.3 g/dL — ABNORMAL LOW (ref 3.5–5.0)
Alkaline Phosphatase: 80 U/L (ref 38–126)
Anion gap: 8 (ref 5–15)
BUN: <5 mg/dL — ABNORMAL LOW (ref 6–20)
CO2: 28 mmol/L (ref 22–32)
Calcium: 9.3 mg/dL (ref 8.9–10.3)
Chloride: 108 mmol/L (ref 98–111)
Creatinine, Ser: 0.47 mg/dL (ref 0.44–1.00)
GFR, Estimated: >60 mL/min (ref >60)
Glucose, Bld: 136 mg/dL — ABNORMAL HIGH (ref 70–99)
Potassium: 3.4 mmol/L — ABNORMAL LOW (ref 3.5–5.1)
Sodium: 144 mmol/L (ref 135–145)
Total Bilirubin: 0.3 mg/dL (ref 0.3–1.2)
Total Protein: 7.5 g/dL (ref 6.5–8.1)

## 2021-05-05 LAB — CBC
HCT: 29.8 % — ABNORMAL LOW (ref 36.0–46.0)
Hemoglobin: 9.3 g/dL — ABNORMAL LOW (ref 12.0–15.0)
MCH: 26 pg (ref 26.0–34.0)
MCHC: 31.2 g/dL (ref 30.0–36.0)
MCV: 83.2 fL (ref 80.0–100.0)
Platelets: 298 10*3/uL (ref 150–400)
RBC: 3.58 MIL/uL — ABNORMAL LOW (ref 3.87–5.11)
RDW: 14.3 % (ref 11.5–15.5)
WBC: 9 10*3/uL (ref 4.0–10.5)
nRBC: 0 % (ref 0.0–0.2)

## 2021-05-05 MED ORDER — ADULT MULTIVITAMIN W/MINERALS CH
1.0000 | ORAL_TABLET | Freq: Every day | ORAL | Status: DC
  Administered 2021-05-05 – 2021-05-06 (×2): 1 via ORAL
  Filled 2021-05-05 (×2): qty 1

## 2021-05-05 MED ORDER — ACETAMINOPHEN 325 MG PO TABS: 650.0000 mg | ORAL_TABLET | Freq: Four times a day (QID) | ORAL | Status: DC | PRN

## 2021-05-05 MED ORDER — ENSURE ENLIVE PO LIQD: 237.0000 mL | Freq: Two times a day (BID) | ORAL | Status: DC

## 2021-05-05 MED ORDER — PANTOPRAZOLE SODIUM 40 MG PO TBEC
40.0000 mg | DELAYED_RELEASE_TABLET | Freq: Every day | ORAL | Status: DC
  Administered 2021-05-05: 40 mg via ORAL
  Filled 2021-05-05: qty 1

## 2021-05-05 NOTE — Progress Notes (Signed)
Initial Nutrition Assessment  DOCUMENTATION CODES:   Obesity unspecified  INTERVENTION:   -Ensure Enlive po BID, each supplement provides 350 kcal and 20 grams of protein  -MVI with minerals daily  NUTRITION DIAGNOSIS:   Increased nutrient needs related to cancer and cancer related treatments as evidenced by estimated needs.  GOAL:   Patient will meet greater than or equal to 90% of their needs  MONITOR:   PO intake, Supplement acceptance, Labs, Weight trends, Skin, I & O's  REASON FOR ASSESSMENT:   Malnutrition Screening Tool    ASSESSMENT:   Sarah Newman is a 45 y.o. 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  Pt admitted with SBO.  9/30- s/p Removal of mushroom catheter  Reviewed I/O's: +2 L x 24 hours and +5.5 L since admission  Drain output: 65 ml x 24 hours  Pt unavailable at time of visit. Attempted to speak with pt via call to hospital room phone, however, unable to reach. RD unable to obtain further nutrition-related history or complete nutrition-focused physical exam at this time.    Per H&P, pt unable to tolerate anything but liquids for 5 days PTA.   Per MD notes, plan for discharge soon; awaiting tolerance of full liquid diet and result of wound cultures.   Pt advanced to regular diet this afternoon. Noted meal completions 45%.   Reviewed wt hx; pt has experienced a 4.9% wt loss over the past month. While this is not significant for time frame, it is concerning given decreased oral intake and cancer diagnosis.   Medications reviewed and include calcium carbonate.   Labs reviewed: K: 3.4.    Diet Order:   Diet Order             Diet regular Room service  appropriate? Yes; Fluid consistency: Thin  Diet effective now                   EDUCATION NEEDS:   No education needs have been identified at this time  Skin:  Skin Assessment: Skin Integrity Issues: Skin Integrity Issues:: Incisions Incisions: closed abdomen  Last BM:  Unknown  Height:   Ht Readings from Last 1 Encounters:  05/03/21 5\' 2"  (1.575 m)    Weight:   Wt Readings from Last 1 Encounters:  05/03/21 79.8 kg    Ideal Body Weight:  45.5 kg  BMI:  Body mass index is 32.18 kg/m.  Estimated Nutritional Needs:   Kcal:  1600-1800  Protein:  75-90 grams  Fluid:  > 1.6 L    Loistine Chance, RD, LDN, [place] Registered Dietitian II Certified Diabetes Care and Education Specialist Please refer to [facility] for RD and/or RD on-call/weekend/after hours pager

## 2021-05-05 NOTE — Progress Notes (Signed)
The patient is receiving Protonix by the intravenous route.  Based on criteria approved by the Pharmacy and [place], the medication is being converted to the equivalent oral dose form.  These criteria include: -No active GI bleeding -Able to tolerate diet of full liquids (or better) or tube feeding -Able to tolerate other medications by the oral or enteral route  If you have any questions about this conversion, please contact the Pharmacy Department (phone [phone]).  Thank you.   Minda Ditto PharmD [facility] [phone] 05/05/2021, 1:26 PM

## 2021-05-05 NOTE — Progress Notes (Signed)
2 Days Post-Op Procedure(s) (LRB): incision and drainage abdominal collection and placement drainage catheter (N/A)  Subjective: Patient reports doing well. Had BM x2 this am, continues to endorse flatus. Tolerating liquids without any nausea or emesis. Minimal abdominal pain. Voiding freely. Ambulating without issues.  Objective: Vital signs in last 24 hours: Temp:  [97.6 F (36.4 C)-98.3 F (36.8 C)] 97.8 F (36.6 C) (10/02 0533) Pulse Rate:  [65-82] 75 (10/02 0533) Resp:  [16-17] 16 (10/02 0533) BP: (108-139)/(80-96) 114/80 (10/02 0533) SpO2:  [97 %-100 %] 97 % (10/02 0533) Last BM Date: 04/28/21  Intake/Output from previous day: 10/01 0701 - 10/02 0700 In: 3485.5 [P.O.:800; I.V.:2385.2; IV Piggyback:300.2] Out: 8144 [Urine:1440; Drains:65]  Physical Examination: General: No acute distress, alert and oriented HEENT: Atraumatic, normocephalic Cardiovascular: Regular rate and rhythm, no murmurs, rubs, or gallops Pulmonary: Lungs are clear to auscultation bilaterally, no wheezes or rhonchi Abdomen: Soft, mildly distended, nontender to palpation.  Normoactive bowel sounds.  Packing removed, wound irrigated and explored gently with a digit.  No purulent fluid noted.  Wound repacked loosely with 1 inch iodoform packing.  Minimal serosanguineous fluid noted in multipurpose drain. Remedies: Warm and well perfused, no edema or calf tenderness to palpation.  Labs: WBC/Hgb/Hct/Plts:  9.0/9.3/29.8/298 (10/02 0459) BUN/Cr/glu/ALT/AST/amyl/lip:  <5/0.47/--/18/25/--/-- (10/02 0459)   Assessment:  45 y.o. s/p Procedure(s): incision and drainage abdominal collection and placement drainage catheter: Meeting postoperative milestones now with return of bowel function.   Pain: Well controlled on IV medication. Will plan to transition to oral medications later today or tomorrow if tolerates diet.   ID: Purulent fluid noted on incision and drainage yesterday of subcutaneous abscess.  This was  explored and irrigated copiously.  Culture showing gram-negative rods and abundant white blood cells.  Awaiting speciation as well as sensitivities.  Patient is currently on cefepime based on last culture sensitivities.   GI: Now with bowel function, tolerating liquids. Will advance gently to full diet.   FEN: dc mIVFs.   Prophylaxis: Lovenox.  Plan: Anticipate dc tomorrow or Tuesday - awaiting toleration of full diet and speciation of wound cultures for transition to oral antibiotics. Dispo:  home health The patient is to be discharged to home.   LOS: 2 days    Lafonda Mosses 05/05/2021, 10:27 AM

## 2021-05-06 ENCOUNTER — Other Ambulatory Visit (HOSPITAL_COMMUNITY): Payer: Self-pay

## 2021-05-06 ENCOUNTER — Ambulatory Visit (HOSPITAL_COMMUNITY): Payer: Medicaid Other

## 2021-05-06 ENCOUNTER — Other Ambulatory Visit: Payer: Self-pay | Admitting: Gynecologic Oncology

## 2021-05-06 DIAGNOSIS — L02211 Cutaneous abscess of abdominal wall: Secondary | ICD-10-CM

## 2021-05-06 DIAGNOSIS — R188 Other ascites: Secondary | ICD-10-CM

## 2021-05-06 LAB — BASIC METABOLIC PANEL
Anion gap: 6 (ref 5–15)
Anion gap: 11 (ref 5–15)
BUN: <5 mg/dL — ABNORMAL LOW (ref 6–20)
BUN: <5 mg/dL — ABNORMAL LOW (ref 6–20)
CO2: 31 mmol/L (ref 22–32)
CO2: 27 mmol/L (ref 22–32)
Calcium: 8.8 mg/dL — ABNORMAL LOW (ref 8.9–10.3)
Calcium: 9.2 mg/dL (ref 8.9–10.3)
Chloride: 102 mmol/L (ref 98–111)
Chloride: 104 mmol/L (ref 98–111)
Creatinine, Ser: 0.44 mg/dL (ref 0.44–1.00)
Creatinine, Ser: 0.41 mg/dL — ABNORMAL LOW (ref 0.44–1.00)
GFR, Estimated: >60 mL/min (ref >60)
GFR, Estimated: >60 mL/min (ref >60)
Glucose, Bld: 98 mg/dL (ref 70–99)
Glucose, Bld: 100 mg/dL — ABNORMAL HIGH (ref 70–99)
Potassium: 2.8 mmol/L — ABNORMAL LOW (ref 3.5–5.1)
Potassium: 3.3 mmol/L — ABNORMAL LOW (ref 3.5–5.1)
Sodium: 139 mmol/L (ref 135–145)
Sodium: 142 mmol/L (ref 135–145)

## 2021-05-06 LAB — PHOSPHORUS: Phosphorus: 3.4 mg/dL (ref 2.5–4.6)

## 2021-05-06 LAB — AEROBIC/ANAEROBIC CULTURE W GRAM STAIN (SURGICAL/DEEP WOUND)

## 2021-05-06 LAB — CBC
HCT: 29.8 % — ABNORMAL LOW (ref 36.0–46.0)
Hemoglobin: 9.3 g/dL — ABNORMAL LOW (ref 12.0–15.0)
MCH: 25.8 pg — ABNORMAL LOW (ref 26.0–34.0)
MCHC: 31.2 g/dL (ref 30.0–36.0)
MCV: 82.8 fL (ref 80.0–100.0)
Platelets: 286 10*3/uL (ref 150–400)
RBC: 3.6 MIL/uL — ABNORMAL LOW (ref 3.87–5.11)
RDW: 14.3 % (ref 11.5–15.5)
WBC: 4.5 10*3/uL (ref 4.0–10.5)
nRBC: 0 % (ref 0.0–0.2)

## 2021-05-06 LAB — MAGNESIUM: Magnesium: 1.6 mg/dL — ABNORMAL LOW (ref 1.7–2.4)

## 2021-05-06 MED ORDER — SODIUM CHLORIDE 0.9% FLUSH: 10.0000 mL | INTRAVENOUS | Status: DC | PRN

## 2021-05-06 MED ORDER — SODIUM CHLORIDE 0.9% FLUSH: 10.0000 mL | Freq: Two times a day (BID) | INTRAVENOUS | Status: DC

## 2021-05-06 MED ORDER — CIPROFLOXACIN HCL 500 MG PO TABS
500.0000 mg | ORAL_TABLET | Freq: Two times a day (BID) | ORAL | Status: DC
  Administered 2021-05-06: 500 mg via ORAL
  Filled 2021-05-06: qty 1

## 2021-05-06 MED ORDER — MAGNESIUM OXIDE -MG SUPPLEMENT 400 (240 MG) MG PO TABS
200.0000 mg | ORAL_TABLET | Freq: Two times a day (BID) | ORAL | Status: DC
  Administered 2021-05-06: 200 mg via ORAL
  Filled 2021-05-06: qty 1

## 2021-05-06 MED ORDER — POTASSIUM CHLORIDE 10 MEQ/100ML IV SOLN
10.0000 meq | INTRAVENOUS | Status: AC
  Administered 2021-05-06 (×3): 10 meq via INTRAVENOUS
  Filled 2021-05-06: qty 100

## 2021-05-06 MED ORDER — CIPROFLOXACIN HCL 500 MG PO TABS
500.0000 mg | ORAL_TABLET | Freq: Two times a day (BID) | ORAL | 0 refills | Status: AC
  Filled 2021-05-06: qty 20, 10d supply, fill #0

## 2021-05-06 NOTE — Progress Notes (Addendum)
MD Berline Lopes was called because patient states she will be leaving at 9p regardless if results for her potassium level was back. MD Berline Lopes stated that was fine. IV team removed patient's midline. Discharge instructions were given to patient. I walked with her to the main entrance, she said she was going to take the bus to get home.

## 2021-05-06 NOTE — Discharge Instructions (Addendum)
05/06/2021  Plan on changing the packing in the open abdominal wound at least once a day. You will want to flush the drain before placing in the new packing. Once you have flushed the drain with 10 cc or ml of sterile saline, you can then remove the old packing and place the new packing in. Plan to change the dressing around the drain once a day as well. You can continue monitoring the output from the drain and keep a log.   Dr. Berline Lopes is recommending that you begin taking Ciprofloxacin twice a day for the next ten days. Please see the attached handout. I have bolded some areas for your review as well.   If you are at home and start to feel poorly (fever, chills, abdominal pain, redness/swelling/pain around the incision site or drain site, nausea/vomiting or any new symptoms), please call the office.  Your potassium was slightly low while in the hospital. We gave you replacement through your IV before you left the hospital. Dr. Berline Lopes would still like for you to eat a banana every day for the next several days if possible.   We will plan on seeing you in the office on May 17, 2021 with a CT scan earlier in the day or the day before. Once you are discharged from the hospital, Sharyn Lull our scheduler will be able to schedule the CT scan for you and contact you with the information.      Activity: 1. Be up and out of the bed during the day.  Take a nap if needed.  You may walk up steps but be careful and use the hand rail.  Stair climbing will tire you more than you think, you may need to stop part way and rest.   2. No lifting or straining over 10 lbs, pushing, pulling, straining for 4-6 weeks.  3. Do not drive if you are taking narcotic pain medicine. You need to make sure your reaction time has returned and you can brake safely.  4. Shower daily.  Use your regular soap to bathe.  No tub baths until cleared by your surgeon.   5. Take Tylenol first for pain and only use narcotic pain medication  for severe pain not relieved by the Tylenol. Monitor your Tylenol intake to a max of 4,000 mg.   Diet: 1. Low sodium Heart Healthy Diet is recommended.  2. It is safe to use a laxative, such as Miralax or Colace, if you have difficulty moving your bowels.   Wound Care: 1. Keep clean and dry.  Shower daily.  Reasons to call the Doctor: Fever - Oral temperature greater than 100.4 degrees Fahrenheit Difficulty urinating Nausea and vomiting Increased pain at the site of the incision that is unrelieved with pain medicine. Difficulty breathing with or without chest pain New calf pain especially if only on one side   Contacts: For questions or concerns you should contact:  Dr. Jeral Pinch at [phone]  Joylene John, NP at [phone]  After Hours: call [phone] and have the GYN Oncologist paged/contacted

## 2021-05-06 NOTE — Progress Notes (Signed)
   05/06/21 1300  Mobility  Activity Ambulated in hall  Level of Assistance Independent  Assistive Device None  Distance Ambulated (ft) 500 ft  Mobility Ambulated independently in hallway  Mobility Response Tolerated well  Mobility performed by Mobility specialist  $Mobility charge 1 Mobility   Pt reluctant to mobilize at first, but eventually was agreeable. Ambulated in the hall about 55ft with no device, tolerated well. Pt stated she walks in the hallway independently every day. Left pt in room at the bench by the window with GYN. Notified RN of session.    [place] Specialist Acute Rehab Services Office: [phone]

## 2021-05-06 NOTE — Progress Notes (Signed)
This patient has extremely poor vasculature. Bilateral arms assessed with [place]. Appropriate vein located and midline placed. Notified nurse. Fran Lowes, RN VAST

## 2021-05-06 NOTE — Discharge Summary (Addendum)
Physician Discharge Summary  Patient ID: Sarah Newman MRN: [id number] DOB/AGE: Jan 18, 1976 45 y.o.  Admit date: 05/03/2021 Discharge date: 05/06/2021  Admission Diagnoses: Abdominal wall abscess, Partial small bowel obstruction  Discharge Diagnoses:  Principal Problem:   Abdominal wall abscess Active Problems:   Nausea and vomiting   Partial small bowel obstruction (HCC)   Abdominal fluid collection  Discharged Condition:  The patient is in good condition and stable for discharge.    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 She presented to the ED on 05/03/2021 for fever, abdominal pain/swelling, and nausea/vomiting. CT imaging showed a partial small bowel obstruction along with a complex abdominal wall fluid collection suspicious for subcutaneous abscess.   On 05/03/2021, the patient underwent the following: incision and drainage of abdominal collection and placement drainage catheter. Drainage was sent for culture with sensitivities returning today. During surgery, she can an NG placed for decompression due to partial SBO. She was transitioned from cefepime IV to oral Cipro. On POD 3, she received electrolyte replacement of potassium and magnesium. The postoperative course was uneventful. She was discharged to home on postoperative day 3 tolerating a regular diet, having bowel movements and passing flatus, pain controlled, WBC normalized, on oral antibiotics, able to change abdominal wound packing and flush drain.   Consults: None  Significant Diagnostic Studies: CT imaging, Labs, Wound/Blood culture  Treatments: Surgery (see above), Decompression with NG tube placement, IV antibiotics, Electrolyte  replacement, Dressing changes with drain flushes  Discharge Exam (AM assessment with Dr. Berline Lopes): Blood pressure (!) 134/97, pulse 77, temperature 98.1 F (36.7 C), temperature source Oral, resp. rate 16, height 5\' 2"  (1.575 m), weight 175 lb 14.8 oz (79.8 kg), last menstrual period 07/05/2019, SpO2 100 %. General appearance: alert, cooperative, and no distress Resp: clear to auscultation bilaterally Cardio: regular rate and rhythm, S1, S2 normal, no murmur, click, rub or gallop GI: soft, non-tender; bowel sounds normal; no masses,  no organomegaly Extremities: extremities normal, atraumatic, no cyanosis or edema Incision/Wound: Dry dressing around abdominal drain with light tan drainage noted in the JP bulb. Dressing over abdominal wound intact. Pt planning on changing this later this am with the RN.  Disposition: Discharge disposition: 01-Home or Self Care      Discharge Instructions     Call MD for:  difficulty breathing, headache or visual disturbances   Complete by: As directed    Call MD for:  extreme fatigue   Complete by: As directed    Call MD for:  hives   Complete by: As directed    Call MD for:  persistant dizziness or light-headedness   Complete by: As directed    Call MD for:  persistant nausea and vomiting   Complete by: As directed    Call MD for:  redness, tenderness, or signs of infection (pain, swelling, redness, odor or green/yellow discharge around incision site)   Complete by: As directed    Call MD for:  severe uncontrolled pain   Complete by: As directed    Call MD for:  temperature >100.4   Complete by: As directed    Diet - low sodium heart healthy   Complete by: As directed    Discharge wound care:   Complete by: As directed  Plan on changing the packing in the open abdominal wound at least once a day. You will want to flush the drain before placing in the new packing. Once you have flushed the drain with 10 cc or ml of sterile saline, you can then  remove the old packing and place the new packing in. Plan to change the dressing around the drain once a day as well. You can continue monitoring the output from the drain and keep a log.   Driving Restrictions   Complete by: As directed    Do not take narcotics and drive. Make sure your reaction time has returned.   Increase activity slowly   Complete by: As directed    Lifting restrictions   Complete by: As directed    No lifting greater than 10 lbs while drain is in place.      Allergies as of 05/06/2021       Reactions   Carboplatin Shortness Of Breath        Medication List     STOP taking these medications    cephALEXin 500 MG capsule Commonly known as: KEFLEX   fluconazole 200 MG tablet Commonly known as: DIFLUCAN   Ostomy Supplies Pouch Misc       TAKE these medications    Accu-Chek Softclix Lancets lancets Use as instructed   acetaminophen 325 MG tablet Commonly known as: TYLENOL Take 650 mg by mouth every 6 (six) hours as needed for mild pain, fever or headache.   acetaminophen 650 MG suppository Commonly known as: TYLENOL Place 1 suppository (650 mg total) rectally every 6 (six) hours as needed for moderate pain.   ciprofloxacin 500 MG tablet Commonly known as: CIPRO Take 1 tablet (500 mg total) by mouth 2 (two) times daily for 10 days.   glucose blood test strip Use as instructed   lidocaine-prilocaine cream Commonly known as: EMLA APPLY TO THE AFFECTED AREA AS DIRECTED TO ACCESS PORT   LORazepam 0.5 MG tablet Commonly known as: ATIVAN Take 1 tablet by mouth every 8 hours as needed for anxiety/insomnia. Do not take and drive.   omeprazole 40 MG capsule Commonly known as: PRILOSEC Take 1 capsule (40 mg total) by mouth daily.   ondansetron 4 MG disintegrating tablet Commonly known as: Zofran ODT Take 1 tablet (4 mg total) by mouth every 4 (four) hours as needed for up to 20 doses for nausea or vomiting.   oxyCODONE 5 MG immediate  release tablet Commonly known as: Oxy IR/ROXICODONE Take 1 tablet (5 mg total) by mouth every 4 (four) hours as needed for severe pain. Do not take and drive   promethazine 12.5 MG suppository Commonly known as: PHENERGAN Place 1 suppository (12.5 mg total) rectally every 8 (eight) hours as needed for up to 30 doses for nausea or vomiting.               Discharge Care Instructions  (From admission, onward)           Start     Ordered   05/06/21 0000  Discharge wound care:       Comments: Plan on changing the packing in the open abdominal wound at least once a day. You will want to flush the drain before placing in the new packing. Once you have flushed the drain with 10 cc or ml of sterile saline, you can then remove the old packing and place the new packing in. Plan to change the dressing around the drain once a  day as well. You can continue monitoring the output from the drain and keep a log.   05/06/21 1311            Follow-up Information     Lafonda Mosses, MD Follow up on 05/17/2021.   Specialty: Gynecologic Oncology Why: at 3pm at the [facility]. Plan to have a CT scan before this visit. Contact information: [place] [address] [phone]                 Greater than thirty minutes were spend for face to face discharge instructions and discharge orders/summary in EPIC.   Signed: [name] 05/06/2021, 3:14 PM

## 2021-05-07 ENCOUNTER — Telehealth: Payer: Self-pay | Admitting: *Deleted

## 2021-05-07 ENCOUNTER — Encounter: Payer: Self-pay | Admitting: Gynecologic Oncology

## 2021-05-07 ENCOUNTER — Other Ambulatory Visit (HOSPITAL_COMMUNITY): Payer: Self-pay

## 2021-05-07 ENCOUNTER — Telehealth: Payer: Self-pay

## 2021-05-07 NOTE — Telephone Encounter (Signed)
Left message requesting return call

## 2021-05-07 NOTE — Telephone Encounter (Signed)
Called and scheduled the patient for a CT scan on 10/14 at 1 pm, patient to see Dr Berline Lopes after. Patient aware

## 2021-05-09 LAB — CULTURE, BLOOD (ROUTINE X 2)
Culture: NO GROWTH
Culture: NO GROWTH
Special Requests: ADEQUATE
Special Requests: ADEQUATE

## 2021-05-10 ENCOUNTER — Encounter: Payer: Self-pay | Admitting: Hematology and Oncology

## 2021-05-10 ENCOUNTER — Telehealth: Payer: Self-pay

## 2021-05-10 NOTE — Telephone Encounter (Signed)
Transition Care Management Follow-up Telephone Call Date of discharge and from where: 05/06/2021  [facility] How have you been since you were released from the hospital? " Doing ok, still recovering" Any questions or concerns? No  Items Reviewed: Did the pt receive and understand the discharge instructions provided? Yes  Medications obtained and verified? Yes  Other? No  Any new allergies since your discharge? No  Dietary orders reviewed? Yes Do you have support at home? Yes   Home Care and Equipment/Supplies: Were home health services ordered? no If so, what is the name of the agency?  Has the agency set up a time to come to the patient's home? not applicable Were any new equipment or medical supplies ordered?  No What is the name of the medical supply agency?  Were you able to get the supplies/equipment? not applicable Do you have any questions related to the use of the equipment or supplies? No  Functional Questionnaire: (I = Independent and D = Dependent) ADLs: I  Bathing/Dressing- I  Meal Prep- I  Eating- I  REPORTS NOT EATING WELL  Maintaining continence- I  Transferring/Ambulation- I  Managing Meds- I  Follow up appointments reviewed:  PCP Hospital f/u appt confirmed? No  Specialist Hospital f/u appt confirmed?  Scheduled to see Dr. Berline Lopes on 05/17/2021 @ 3pm. Are transportation arrangements needed? No  If their condition worsens, is the pt aware to call PCP or go to the Emergency Dept.? Yes Was the patient provided with contact information for the PCP's office or ED? Yes Was to pt encouraged to call back with questions or concerns? Yes  Tomasa Rand, RN, BSN, CEN [facility] [employer] [phone]

## 2021-05-10 NOTE — Telephone Encounter (Signed)
Following up with Sarah Newman, she states she is drinking and urinating well. She is eating smaller amounts. She reports BM. Denies fever or chills. She states her wound looks good, she is cleaning it daily and has not had a lot of drainage. Her pain is tolerable, she has not taken pain medicine as she states it comes and goes.  Instructed to call office with any fever, chills, purulent drainage, uncontrolled pain or any other questions or concerns. Patient verbalizes understanding.   Pt aware of post op appointments as well as the office number [phone] and after hours number [phone] to call if she has any questions or concerns

## 2021-05-13 ENCOUNTER — Encounter (HOSPITAL_COMMUNITY): Payer: Self-pay | Admitting: Gynecologic Oncology

## 2021-05-16 NOTE — Progress Notes (Signed)
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 There is a 16 cm complex cystic mass in the pelvis containing thickened septations located near midline, abutting the superior aspect of the uterus. I suspect this mass is adnexal/ovarian in origin rather than uterine in origin. Specifically, I suspect the mass likely arises from the left ovary/adnexa and is most consistent with a neoplasm. Malignancy is certainly not excluded on this study. Recommend a pelvic ultrasound for further evaluation. 2. The rounded more solid-appearing structure in the right side of the pelvis is probably the right ovary. Recommend attention on ultrasound. 3. The small amount of fluid in the pelvis is likely reactive to the complex cystic mass likely rising from the left ovary/adnexa. 4. No cause for blood in vomit or black stools identified. No convincing evidence of a perforated or inflamed gastric or duodenal ulcer. 5. Hepatic steatosis.   07/02/2019 Imaging   MRI pelvis 1. 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 There are 2 enhancing nodules within the central upper abdomen raising the possibility of peritoneal carcinomatosis. Additionally, there is a small amount fluid within the pelvis. Malignant ascites not excluded. 2. Fibroid uterus. 3. Hepatic steatosis.   07/02/2019 Imaging   US pelvis 1. There is a large mass in the pelvis also seen on CT imaging. A separate left ovary is not visualized. 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 The mass is suspicious for malignancy. Recommend gynecologic consultation. 2. The right  ovary demonstrates an irregular ill-defined hypoechoic hypervascular region centrally. The findings are nonspecific in the right ovary. However, given the apparent left ovarian neoplasm suspicious for malignancy, recommend an MRI to further assess the right ovary. 3. Uterine fibroid measuring 3.6 cm.     07/21/2019 Pathology Results   FINAL MICROSCOPIC DIAGNOSIS: A. 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 - See oncology table. B. OMENTUM, RESECTION: - Omental lymph node with metastatic carcinoma (1/1). - Omental adipose tissue with foci of inflammation and reactive mesothelial changes. C. FALLOPIAN TUBE, LEFT, RESECTION: - Fallopian tube with focal, mild inflammation and fibrosis. - No tumor identified. D. UTERUS, CERVIX, RIGHT TUBE AND OVARY: - Cervix Nabothian cyst and squamous metaplasia. - Endometrium Proliferative. No hyperplasia or carcinoma. - Myometrium Leiomyomata. No malignancy. -Right ovary Poorly differentiated carcinoma, 4.8 cm. See oncology table and comment. -Right Fallopian tube Benign paratubal cyst. No endometriosis or malignancy. ONCOLOGY TABLE: OVARY or FALLOPIAN TUBE or PRIMARY PERITONEUM: Procedure: Bilateral f-oophorectomy, hysterectomy and omentectomy. Specimen Integrity: Intact Tumor Site: Left ovary and right ovary. See comment. Ovarian Surface Involvement: Not identified. Fallopian Tube Surface Involvement: Not identified. Tumor Size: Left ovary: 18.6 x 16.0 x 7.2 cm. Right ovary: 4.8 x 3.2 x 3.2 cm. Histologic Type: Clear cell carcinoma. See comment. Histologic Grade: High-grade. Other Tissue/ Organ Involvement: Omental lymph node with metastatic carcinoma. Peritoneal/Ascitic Fluid: Pending. Treatment Effect: No known presurgical therapy. Pathologic Stage Classification (pTNM, AJCC 8th Edition): pT3a, pN1a. Representative Tumor Block: A2, A3, A4, A5 A6, A7 and A8. Comment(s): The left ovarian tumor is  18.6 cm in greatest dimension and has features of clear cell carcinoma. The  right ovarian tumor is 4.8 cm in greatest dimension and is poorly differentiated with focal features consistent with clear cell carcinoma. The pattern of involvement suggests that the right ovarian tumor is likely metastatic from the larger left ovarian clear cell carcinoma. Sections of the omentum show a lymph node with metastatic carcinoma. Sections of the remainder of the omentum do not show involvement by carcinoma.   07/21/2019 Surgery   Procedure(s) Performed: Exploratory laparotomy with radical tumor debulking including total hysterectomy, bilateral salpingo-oophorectomy, infra-gastric omentectomy, and washings.   Surgeon: Jeral Pinch, MD    Operative Findings: On EUA, mobile uterus and ovarian mass, situated out of the pelvis, spanning 6-8 cm above the umbilicus.  On intra-abdominal entry, inflamed omentum adherent to much of the 16 cm left ovarian mass, adhesions easily lysed bluntly.  No nodularity of the ovarian mass however on delivery of the mass through the midline incision, there was Intra-Op rupture of one of the smaller cystic components with drainage of clear brown-tinged fluid.  Grossly normal-appearing left fallopian tube as well as right tube.  Right ovary mildly enlarged measuring approximately 4 cm and firm/nodular, suspicious for tumor involvement.  Uterus 8-10 cm with 4 cm anterior mid body fibroid.  Some very small volume miliary disease along right pelvic sidewall, removed with uterine specimen.  Evidence of small diverticular disease.  Small bowel normal appearing although multiple loops of bowel adherent to each other, especially by mesentery, and an inflammatory manner.  An approximately 2 x 2 centimeter nodule suspicious for tumor implant was found in the omentum just inferior to the greater curvature of the stomach.  Additional small (less than 5 mm) implants noted on the stomach and posterior  aspect of the left lobe of the liver.  Otherwise the liver surface and diaphragm were smooth.   08/03/2019 Cancer Staging   Staging form: Ovary, Fallopian Tube, and Primary Peritoneal Carcinoma, AJCC 8th Edition - Pathologic: Stage IIIA2 (pT3a, pN1, cM0) - Signed by Heath Lark, MD on 08/03/2019   08/12/2019 Procedure   Placement of single lumen port a cath via right internal jugular vein. The catheter tip lies at the cavo-atrial junction. A power injectable port a cath was placed and is ready for immediate use.   08/22/2019 -  Chemotherapy   The patient had carboplatin and taxol for chemotherapy treatment.     09/23/2019 Genetic Testing   Negative genetic testing:  No pathogenic variants detected on the [facility] TumorNext-HRD+CancerNext paired germline and somatic genetic test. The report date is 09/23/2019.  The TumorNext-HRD+CancerNext test offered by [facility] includes paired germline and tumor analyses of 11 genes associated with homologous recombination repair (ATM, BARD1, BRIP1, CHEK2, MRE11A, NBN, PALB2, RAD51C, RAD51D, BRCA1, BRCA2) plus germline analyses of 26 additional genes associated with hereditary cancer (APC, AXIN2, BMPR1A, CDH1, CDK4, CDKN2A, DICER1, HOXB13, EPCAM, GREM1, MLH1, MSH2, MSH3, MSH6, MUTYH, NF1, NTHL1, PMS2, POLD1, POLE, PTEN, RECQL, SMAD4, SMARCA4, STK11, and TP53).   11/14/2019 Tumor Marker   Patient's tumor was tested for the following markers: CA-125 Results of the tumor marker test revealed 10.2.   12/05/2019 Tumor Marker   Patient's tumor was tested for the following markers: CA-125 Results of the tumor marker test revealed 9.9   01/05/2020 Imaging   No specific findings of active malignancy. Interval resection of the pelvic mass. Subtle nodularity along the right side of the vaginal cuff merits surveillance.     01/05/2020 Tumor Marker   Patient's tumor was tested for the following markers: CA-125 Results of  the tumor marker test revealed 9.7   02/17/2020 Tumor  Marker   Patient's tumor was tested for the following markers: CA-125 Results of the tumor marker test revealed 9.6   04/05/2020 Imaging   1. There is some minimal stranding along the mesentery and omentum without overt omental caking or well-defined nodularity. Faint bandlike density along the anterior peritoneal reflection in the pelvis, slightly more notable than on prior. Overall the appearance is not considered specific for recurrence but given the slight increase in prominence from 01/05/2020, would encourage careful surveillance by tumor markers and imaging. 2. Mild lower lumbar degenerative facet arthropathy.   04/05/2020 Tumor Marker   Patient's tumor was tested for the following markers: CA-125 Results of the tumor marker test revealed 11   05/29/2020 Tumor Marker   Patient's tumor was tested for the following markers: CA-125. Results of the tumor marker test revealed 17.   07/04/2020 Tumor Marker   Patient's tumor was tested for the following markers: CA-125. Results of the tumor marker test revealed 20.1   07/16/2020 Imaging   Stable mild stranding throughout mesenteric and omental fat, without evidence of discrete masses or ascites. No evidence of new or progressive disease.   08/29/2020 Tumor Marker   Patient's tumor was tested for the following markers: CA-125 Results of the tumor marker test revealed 32.7.   09/07/2020 Imaging   1. Interval progression of peritoneal disease within the abdomen and pelvis. Tumor predominantly involves the serosal surface of the large and small bowel loops. No signs of bowel obstruction identified at this time. 2. No evidence for metastatic disease to the chest.   09/19/2020 Imaging   1. No acute findings identified within the abdomen or pelvis. No evidence for bowel obstruction. 2. Similar appearance of soft tissue thickening along the peritoneal reflections within the abdomen and pelvis compatible with known peritoneal disease. Recently  characterized increased soft tissue along the surface of the jejunal bowel loops and tethering of pelvic small bowel loops is difficult to quantify (and compared with previous exam) due to lack of IV contrast material.     09/20/2020 -  Chemotherapy    Patient is on Treatment Plan: OVARIAN RECURRENT 3RD LINE CARBOPLATIN D1 / GEMCITABINE D1,8 (4/800) Q21D  Carboplatin was discontinued after 10/15/2020 due to allergic reaction      10/01/2020 Tumor Marker   Patient's tumor was tested for the following markers: CA-125. Results of the tumor marker test revealed 37.2.   10/15/2020 Tumor Marker   Patient's tumor was tested for the following markers: CA-125. Results of the tumor marker test revealed 48   10/22/2020 Tumor Marker   Patient's tumor was tested for the following markers: CA-125 Results of the tumor marker test revealed 28.5   11/05/2020 Tumor Marker   Patient's tumor was tested for the following markers: CA-125 Results of the tumor marker test revealed 18.9   12/17/2020 Imaging   1. No significant change in soft tissue thickening and nodularity in the low pelvis, with minimal thickening of the peritoneum throughout the abdomen. 2. Similar appearance of soft tissue thickening about the mid small bowel in the central, ventral abdomen. 3. Findings are consistent with stable peritoneal metastatic disease. 4. Status post hysterectomy and cholecystectomy.   12/18/2020 Tumor Marker   Patient's tumor was tested for the following markers: CA-125 Results of the tumor marker test revealed 10.8   01/11/2021 Imaging   Findings concerning for small bowel obstruction.     01/11/2021 Tumor Marker   Patient's  tumor was tested for the following markers: CA-125. Results of the tumor marker test revealed 10.4.   01/11/2021 Imaging   CT abdomen and pelvis Mildly prominent proximal loops of small bowel although no true transition zone is seen in the distal small bowel appears within normal limits.  These changes could represent early partial small bowel obstruction. The overall appearance however is similar to that seen on prior CT from May of 2022.   Mild peritoneal thickening similar to that seen on the prior exam.   Fatty liver.   No other focal abnormality is noted.     Interval History: The patient presents today for follow-up after recent hospitalization.  She notes that things overall have been going well although is continuing to struggle with eating very much.  She is keeping a food diary and is consistently taking in about 800 cal now.  She gets full quickly and still has to be careful about what she is eating and drinking.  She is using Ensure and other higher calorie and protein nutritional supplements.  She has been using MiraLAX and Colace on alternating days and has had normal bowel function since discharge.  She denies any urinary symptoms.  She denies any nausea or emesis since leaving the hospital.  She has been changing her abdominal wound packing once a day and denies any purulence or significant discharge.  She has been flushing her drain once a day and was having at most 10 cc of serosanguineous output daily although has had none in the last 2 days.  Past Medical/Surgical History: Past Medical History:  Diagnosis Date   Anemia due to antineoplastic chemotherapy    Arthritis    Asthma due to seasonal allergies    01-21-2021 per pt seasonal and exercised induced, does not have inhaler, last used one few years   Chronic abdominal pain 01/2021   Chronic nausea    01-21-2021  per pt intermittant nausea and when has abd pain most of time will have vomiting   Family history of breast cancer    Family history of colon cancer    Family history of ovarian cancer    Family history of prostate cancer    History of 2019 novel coronavirus disease (COVID-19) 09/08/2020   positive results in epic, per pt mild symptoms that resolved   History of Helicobacter pylori infection  07/2016   E[facility] w/ bx's by [facility],  chronic h.pylori w/ gastritis,  treated  (no ulcer)   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;  progression w/ abdominal carcinomatosis   On total parenteral nutrition (TPN) 02/02/2021   infused thru [facility] in place    Steroid-induced diabetes ([place])    followed by dr Alvy Bimler    Past Surgical History:  Procedure Laterality Date   CHOLECYSTECTOMY N/A 02/16/2015   Procedure: LAPAROSCOPIC CHOLECYSTECTOMY WITH INTRAOPERATIVE CHOLANGIOGRAM;  Surgeon: Johnathan Hausen, MD;  Location: [facility];  Service: General;  Laterality: N/A;   INCISION AND DRAINAGE ABSCESS N/A 02/02/2021   Procedure: INCISION AND DRAINAGE ABSCESS;  Surgeon: Lahoma Crocker, MD;  Location: [facility];  Service: Gynecology;  Laterality: N/A;   IR IMAGING GUIDED PORT INSERTION  08/12/2019   IR REMOVAL TUN ACCESS W/ PORT W/O FL MOD SED  03/18/2021   LAPAROSCOPY N/A 01/23/2021   Procedure: LAPAROSCOPY DIAGNOSTIC;  Surgeon: Lafonda Mosses, MD;  Location: [facility];  Service: Gynecology;  Laterality: N/A;   LAPAROTOMY N/A 01/23/2021   Procedure: LAPAROTOMY, ENTEROTOMY REPAIR OF THE BOWEL;  Surgeon: Lafonda Mosses, MD;  Location: [facility];  Service: Gynecology;  Laterality: N/A;   LAPAROTOMY N/A 05/03/2021   Procedure: incision and drainage abdominal collection and placement drainage catheter;  Surgeon: Lafonda Mosses, MD;  Location: [facility];  Service: Gynecology;  Laterality: N/A;   SALPINGOOPHORECTOMY N/A 07/21/2019   Procedure: EXPLORATORY LAPAROTOMY, TOTAL ABDOMINAL HYSTERECTOMY, BILATERAL SALPINGO OOPHORECTOMY, OMENTECTOMY;  Surgeon: Lafonda Mosses, MD;  Location: [facility];  Service: Gynecology;  Laterality: N/A;   UPPER GASTROINTESTINAL ENDOSCOPY  08/01/2016   by dr Corinda Gubler in [place]  N/A 03/11/2021   Procedure: OPENING AND EXPLORATION OF ABDOMINAL EXCISION;  Surgeon: Lafonda Mosses, MD;  Location: [facility];  Service: Gynecology;  Laterality: N/A;    Family History  Problem Relation Age of Onset   Colon cancer Father 44   Prostate cancer Father 73   Colon cancer Paternal Aunt        dx. early 49s   Ovarian cancer Paternal Grandmother 59   Breast cancer Cousin    Endometriosis Mother    Cancer Paternal Uncle        unknown type, dx. early 48s   Cancer Paternal Uncle 61       unknown type   Esophageal cancer Neg Hx     Social History   Socioeconomic History   Marital status: Married    Spouse name: Herbie [place]   Number of children: 2   Years of education: Not on file   Highest education level: Not on file  Occupational History   Not on file  Tobacco Use   Smoking status: Never   Smokeless tobacco: Never  Vaping Use   Vaping Use: Never used  Substance and Sexual Activity   Alcohol use: Yes    Comment: Social drinker   Drug use: Never   Sexual activity: Yes    Birth control/protection: Surgical  Other Topics Concern   Not on file  Social History Narrative   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  Newly married as of October 2020.   2 boys, age 51 and 69   Social Determinants of Health   Emergency planning/management officer Strain: Not on file  Food Insecurity: Not on file  Transportation Needs: Not on file  Physical Activity: Not on file  Stress: Not on file  Social Connections: Not on file    Current Medications:  Current Outpatient Medications:    Accu-Chek Softclix Lancets lancets, Use as instructed, Disp: 100 each, Rfl: 12   acetaminophen (TYLENOL) 325 MG tablet, Take 650 mg by mouth every 6 (six) hours as needed for mild pain, fever or headache., Disp: , Rfl:    acetaminophen (TYLENOL) 650 MG suppository, Place 1 suppository (650 mg total) rectally every 6 (six) hours as needed for moderate pain., Disp: 15 suppository, Rfl: 2    ciprofloxacin (CIPRO) 500 MG tablet, Take 1 tablet (500 mg total) by mouth 2 (two) times daily for 10 days., Disp: 20 tablet, Rfl: 0   glucose blood test strip, Use as instructed, Disp: 100 each, Rfl: 12   lidocaine-prilocaine (EMLA) cream, APPLY TO THE AFFECTED AREA AS DIRECTED TO ACCESS PORT, Disp: 30 g, Rfl: 11   LORazepam (ATIVAN) 0.5 MG tablet, Take 1 tablet by mouth every 8 hours as needed for anxiety/insomnia. Do not take  and drive., Disp: 15 tablet, Rfl: 0   NOVOLOG 100 UNIT/ML injection, Inject into the skin., Disp: , Rfl:    omeprazole (PRILOSEC) 40 MG capsule, Take 1 capsule (40 mg total) by mouth daily., Disp: 30 capsule, Rfl: 3   oxyCODONE (OXY IR/ROXICODONE) 5 MG immediate release tablet, Take 1 tablet (5 mg total) by mouth every 4 (four) hours as needed for severe pain. Do not take and drive, Disp: 30 tablet, Rfl: 0   promethazine (PHENERGAN) 12.5 MG suppository, Place 1 suppository (12.5 mg total) rectally every 8 (eight) hours as needed for up to 30 doses for nausea or vomiting., Disp: 30 each, Rfl: 0   ondansetron (ZOFRAN ODT) 4 MG disintegrating tablet, Take 1 tablet (4 mg total) by mouth every 4 (four) hours as needed for up to 20 doses for nausea or vomiting. (Patient not taking: Reported on 05/03/2021), Disp: 20 tablet, Rfl: 0  Review of Systems: Denies fevers, chills, fatigue. Denies hearing loss, neck lumps or masses, mouth sores, ringing in ears or voice changes. Denies cough or wheezing.  Denies shortness of breath. Denies chest pain or palpitations. Denies leg swelling. Denies abdominal distention, pain, blood in stools, constipation, diarrhea, nausea, vomiting, or early satiety. Denies pain with intercourse, dysuria, frequency, hematuria or incontinence. Denies hot flashes, pelvic pain, vaginal bleeding or vaginal discharge.   Denies joint pain, back pain or muscle pain/cramps. Denies itching, rash, or wounds. Denies dizziness, headaches, numbness or seizures. Denies  swollen lymph nodes or glands, denies easy bruising or bleeding. Denies anxiety, depression, confusion, or decreased concentration.  Physical Exam: BP (!) 108/95 (BP Location: Left Arm, Patient Position: Sitting)   Pulse 88   Temp 98.4 F (36.9 C) (Oral)   Resp 18   Ht _0  (1.575 m)   Wt 168 lb (76.2 kg)   LMP 07/05/2019   SpO2 100%   BMI 30.73 kg/m  General: Alert, oriented, no acute distress. HEENT: Normocephalic, atraumatic, sclera anicteric. Chest: Unlabored breathing on room air. Cardiovascular: Regular rate and rhythm, no murmurs. Abdomen: Obese, soft, nontender.  Normoactive bowel sounds.  No masses or hepatosplenomegaly appreciated.  Bandage removed from surgical incision and packing removed.  Packing with small amount of nonpurulent drainage.  The wound itself has closed significantly with less than a centimeter track.  Tissue is very healthy appearing without any erythema, exudate, or signs of infection.  Drain site is also clean dry and intact.  Tegaderm that had been on was removed today.  No drainage within the drain bulb. Extremities: Grossly normal range of motion.  Warm, well perfused.  No edema bilaterally.  Laboratory & Radiologic Studies: CT A/P today: IMPRESSION: 1. Successful drainage of previously demonstrated air-collection in the anterior abdominal wall following pigtail catheter placement. 2. No well-defined residual enterocutaneous fistula identified, although there is faintly increased density within the anterior abdominal wall deep to the catheter which could reflect enhancing granulation tissue or a residual small fistula. Correlate with output from the drain. 3. No evidence of bowel obstruction or extraluminal intra-abdominal fluid or air collections. 4. Stable mild biliary dilatation post cholecystectomy.  Assessment & Plan: Sarah Newman is a 45 y.o. 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  Presenting today for hospital follow-up.  Patient is  overall doing well since discharge.  She continues to struggle to eat enough and has continued to lose weight.  She is tolerating p.o. intake and today we discussed some strategies to increase both her protein and caloric intake.  At 800 cal a day, she will continue to lose weight as this is well below her caloric need.  We reviewed CT findings which show no definitive evidence of persistent enterocutaneous fistula.  She has had minimal to no drainage from her subcutaneous drain since hospital discharge and the wound is healing well.  There is no recollection of fluid.  I would like to leave the drain in place until her abdominal incision has closed.  We will plan to reimage probably in about 3 weeks and if no fluid collection and no continued evidence of enterocutaneous fistula, then we will plan to remove the drain.  Patient will continue to do wound dressing changes daily and increase flushing of her drain to twice a day.  I will see her back in 2-1/2 to 3 weeks and pending how she is doing and her exam, we will schedule a CT scan for shortly after.  32 minutes of total time was spent for this patient encounter, including preparation, face-to-face counseling with the patient and coordination of care, and documentation of the encounter.  Jeral Pinch, MD  Division of Gynecologic Oncology  Department of Obstetrics and Gynecology  [facility]

## 2021-05-17 ENCOUNTER — Inpatient Hospital Stay: Payer: Medicaid Other | Attending: Gynecologic Oncology | Admitting: Gynecologic Oncology

## 2021-05-17 ENCOUNTER — Other Ambulatory Visit: Payer: Self-pay

## 2021-05-17 ENCOUNTER — Ambulatory Visit (HOSPITAL_COMMUNITY)
Admission: RE | Admit: 2021-05-17 | Discharge: 2021-05-17 | Disposition: A | Discharge status: Home / Self Care | Payer: Medicaid Other | Source: Ambulatory Visit | Attending: Gynecologic Oncology | Admitting: Gynecologic Oncology

## 2021-05-17 ENCOUNTER — Encounter: Payer: Self-pay | Admitting: Gynecologic Oncology

## 2021-05-17 VITALS — BP 108/95 | HR 88 | Temp 98.4°F | Resp 18 | Ht 62.0 in | Wt 168.0 lb

## 2021-05-17 DIAGNOSIS — R6881 Early satiety: Secondary | ICD-10-CM | POA: Insufficient documentation

## 2021-05-17 DIAGNOSIS — R630 Anorexia: Secondary | ICD-10-CM | POA: Diagnosis not present

## 2021-05-17 DIAGNOSIS — C563 Malignant neoplasm of bilateral ovaries: Secondary | ICD-10-CM

## 2021-05-17 DIAGNOSIS — L02211 Cutaneous abscess of abdominal wall: Secondary | ICD-10-CM | POA: Insufficient documentation

## 2021-05-17 DIAGNOSIS — Z90710 Acquired absence of both cervix and uterus: Secondary | ICD-10-CM | POA: Diagnosis not present

## 2021-05-17 DIAGNOSIS — R188 Other ascites: Secondary | ICD-10-CM | POA: Insufficient documentation

## 2021-05-17 DIAGNOSIS — Z90722 Acquired absence of ovaries, bilateral: Secondary | ICD-10-CM | POA: Diagnosis not present

## 2021-05-17 DIAGNOSIS — K632 Fistula of intestine: Secondary | ICD-10-CM | POA: Diagnosis not present

## 2021-05-17 MED ORDER — IOHEXOL 350 MG/ML SOLN
80.0000 mL | Freq: Once | INTRAVENOUS | Status: AC | PRN
  Administered 2021-05-17: 80 mL via INTRAVENOUS

## 2021-05-21 ENCOUNTER — Telehealth: Payer: Self-pay | Admitting: *Deleted

## 2021-05-21 NOTE — Telephone Encounter (Signed)
Told Ms Gainer that Dr. Berline Lopes said that she can flush the wound twice a day and cover the wound with a bandage. Ms Bedore  verbalized  understanding.

## 2021-05-21 NOTE — Telephone Encounter (Signed)
Patient called and stated "I wanted to let someone know that I can no longer pack the drain wound with packing. The cotton tip doesn't fix. Should I just continue to flush the wound." Message routed to [facility] APP and Dr Berline Lopes

## 2021-05-28 ENCOUNTER — Other Ambulatory Visit: Payer: Self-pay | Admitting: Gynecologic Oncology

## 2021-05-28 ENCOUNTER — Telehealth: Payer: Self-pay

## 2021-05-28 ENCOUNTER — Ambulatory Visit: Payer: Medicaid Other | Admitting: Gynecologic Oncology

## 2021-05-28 DIAGNOSIS — K632 Fistula of intestine: Secondary | ICD-10-CM

## 2021-05-28 NOTE — Telephone Encounter (Signed)
Received call from [facility] inquiring about follow up and next steps. Patient reports her abdominal incision has healed, nothing is draining and she has been unable to flush the drain for about a week. Patient is inquiring when her next CT scan and her follow up will be with Dr. Berline Lopes.  Per last office note we will see her back in 2-1/2 to 3 weeks and pending how she is and her exam, we will schedule a CT scan for shortly after.  Follow up appointment with Dr. Berline Lopes is scheduled for 06/07/21 at 3:45 pm. Patient wondering if this can be moved up as the drain is uncomfortable.

## 2021-05-28 NOTE — Telephone Encounter (Signed)
Spoke with Saul, CT scan has been scheduled for Thursday 05/30/21 at 8am. She is to arrive by 7:45am. Teigan will pick up contrast from cancer center tomorrow 05/29/21. She will need to be NPO 4 hours prior to scan. She will drink 1st bottle of contrast at 6am and second bottle at 7am  Appointment scheduled with Joylene John, NP for after CT on 05/30/21 at 9am. Per Dr. Berline Lopes, pending result of scan drain may be pulled in clinic on 05/30/21.  Patient verbalized understanding and is in agreement with plan. Instructed to call with questions or concerns.

## 2021-05-29 ENCOUNTER — Telehealth: Payer: Self-pay

## 2021-05-29 NOTE — Telephone Encounter (Signed)
Called and scheduled the patient's CT scan for Friday 10/28. Called and left the patient a message with the appt date and time.

## 2021-05-29 NOTE — Telephone Encounter (Signed)
Following up with Sarah Newman. CT scan was cancelled due to insurance not being authorized yet. Prior auth department is working on getting this authorized. Patient would like to attempt to schedule scan for Friday 05/31/21 with the understanding if insurance is not authorized the scan will be cancelled again.  Instructed patient our office will notify her of appointment details.

## 2021-05-29 NOTE — Telephone Encounter (Signed)
Received call from [facility] stating radiology called her and informed her that her CT scan tomorrow 05/30/21 has been cancelled due to her insurance. Patient was advised to follow up with our office. Informed patient that our office will look into this and call her back.

## 2021-05-30 ENCOUNTER — Ambulatory Visit (HOSPITAL_COMMUNITY): Payer: Medicaid Other

## 2021-05-30 ENCOUNTER — Inpatient Hospital Stay: Payer: Medicaid Other | Admitting: Gynecologic Oncology

## 2021-05-30 ENCOUNTER — Telehealth: Payer: Self-pay | Admitting: *Deleted

## 2021-05-30 NOTE — Telephone Encounter (Signed)
error 

## 2021-05-31 ENCOUNTER — Ambulatory Visit (HOSPITAL_COMMUNITY)
Admission: RE | Admit: 2021-05-31 | Discharge: 2021-05-31 | Disposition: A | Discharge status: Home / Self Care | Payer: Medicaid Other | Source: Ambulatory Visit | Attending: Gynecologic Oncology | Admitting: Gynecologic Oncology

## 2021-05-31 ENCOUNTER — Encounter (HOSPITAL_COMMUNITY): Payer: Self-pay

## 2021-05-31 ENCOUNTER — Inpatient Hospital Stay (HOSPITAL_BASED_OUTPATIENT_CLINIC_OR_DEPARTMENT_OTHER): Payer: Medicaid Other | Admitting: Gynecologic Oncology

## 2021-05-31 ENCOUNTER — Ambulatory Visit (HOSPITAL_COMMUNITY): Payer: Medicaid Other

## 2021-05-31 ENCOUNTER — Encounter: Payer: Self-pay | Admitting: Gynecologic Oncology

## 2021-05-31 ENCOUNTER — Other Ambulatory Visit: Payer: Self-pay

## 2021-05-31 VITALS — BP 120/96 | HR 74 | Temp 98.2°F | Resp 16 | Wt 170.0 lb

## 2021-05-31 DIAGNOSIS — L02211 Cutaneous abscess of abdominal wall: Secondary | ICD-10-CM

## 2021-05-31 DIAGNOSIS — K632 Fistula of intestine: Secondary | ICD-10-CM | POA: Diagnosis present

## 2021-05-31 DIAGNOSIS — Z4803 Encounter for change or removal of drains: Secondary | ICD-10-CM

## 2021-05-31 DIAGNOSIS — Z9889 Other specified postprocedural states: Secondary | ICD-10-CM

## 2021-05-31 MED ORDER — IOHEXOL 350 MG/ML SOLN
75.0000 mL | Freq: Once | INTRAVENOUS | Status: AC | PRN
  Administered 2021-05-31: 75 mL via INTRAVENOUS

## 2021-05-31 NOTE — Progress Notes (Signed)
GYN Oncology Follow Up  Sarah Newman is a 45 y.o. 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   She presents today to the office for follow up from CT imaging this afternoon and possible drain removal. She states she has been doing well at home. She has been able to eat more, still compared to a diet of a "[age] year old." Taking in smaller portions but has decreased appetite. She has been able to increase her fluid intake as well. She states she still has abdominal cramping like pain with eating. Her bowels are functioning regularly without use of medication. She is voiding without difficulty. No drainage reported from the abdominal drain unless it has recently been flushed. The previous abdominal incision has closed.   Dr. Berline Lopes spoke with the radiologist about the results with the recommendation for drain removal today. Patient verbalizing consent to proceed.  On examination, her abdomen is soft with no obvious underlying subcutaneous fluid collections. No erythema or tenderness on exam. The skin suture and the suture on the drain that allows you to remove the drain were cut. Upon drain removal, there was a stitch that appeared to be sutured/attached deeper in the incision and this was cut at the skin level. Moderate tenderness reported upon removal but patient tolerated well overall. Drain tubing intact after removal. Dry dressing placed.   Previous drain site care discussed. She is advised to call for any needs, concerns, or new symptoms. Reportable signs and symptoms reviewed. She is to follow up as planned or sooner if needed.

## 2021-05-31 NOTE — Progress Notes (Signed)
Peer to peer performed for CT scan of the abdomen and pelvis to be performed on 05/31/2021. Scan is approved with Auth# [id number] valid till 06/27/2021. Prior auth team notified.

## 2021-06-03 ENCOUNTER — Other Ambulatory Visit (HOSPITAL_COMMUNITY): Payer: Medicaid Other

## 2021-06-03 ENCOUNTER — Encounter: Payer: Self-pay | Admitting: Hematology and Oncology

## 2021-06-03 ENCOUNTER — Telehealth: Payer: Self-pay | Admitting: *Deleted

## 2021-06-03 NOTE — Telephone Encounter (Signed)
Per Dr Berline Lopes moved appt from 11/4 to 11/9

## 2021-06-07 ENCOUNTER — Ambulatory Visit: Payer: Medicaid Other | Admitting: Gynecologic Oncology

## 2021-06-11 ENCOUNTER — Telehealth: Payer: Self-pay

## 2021-06-11 ENCOUNTER — Other Ambulatory Visit: Payer: Self-pay | Admitting: Gynecologic Oncology

## 2021-06-11 ENCOUNTER — Other Ambulatory Visit (HOSPITAL_COMMUNITY): Payer: Self-pay

## 2021-06-11 ENCOUNTER — Encounter: Payer: Self-pay | Admitting: Gynecologic Oncology

## 2021-06-11 DIAGNOSIS — F418 Other specified anxiety disorders: Secondary | ICD-10-CM

## 2021-06-11 DIAGNOSIS — R110 Nausea: Secondary | ICD-10-CM

## 2021-06-11 DIAGNOSIS — K632 Fistula of intestine: Secondary | ICD-10-CM

## 2021-06-11 NOTE — Telephone Encounter (Signed)
Spoke with Sarah Newman regarding prescription refill request. Patient states she has requested a refill for Ativan, Zofran and Prilosec. She reports using the ativan to help with sleep and discomfort from digesting. She is experiencing nausea and is only able to eat one toddler sized portion throughout the day. She is concerned that she is losing weight. Denies difficulty urinating and reports BM are regular without miralax or colace but she does take them prn. Patient confirmed her appointment for 06/12/21 at 3:15pm. MD notified of information.

## 2021-06-12 ENCOUNTER — Encounter: Payer: Self-pay | Admitting: Gynecologic Oncology

## 2021-06-12 ENCOUNTER — Other Ambulatory Visit (HOSPITAL_COMMUNITY): Payer: Self-pay

## 2021-06-12 ENCOUNTER — Inpatient Hospital Stay: Payer: Medicaid Other

## 2021-06-12 ENCOUNTER — Other Ambulatory Visit: Payer: Self-pay

## 2021-06-12 ENCOUNTER — Inpatient Hospital Stay: Payer: Medicaid Other | Attending: Gynecologic Oncology | Admitting: Gynecologic Oncology

## 2021-06-12 VITALS — BP 112/85 | HR 92 | Temp 98.0°F | Resp 16 | Ht 62.0 in | Wt 165.0 lb

## 2021-06-12 DIAGNOSIS — Z8042 Family history of malignant neoplasm of prostate: Secondary | ICD-10-CM | POA: Diagnosis not present

## 2021-06-12 DIAGNOSIS — C563 Malignant neoplasm of bilateral ovaries: Secondary | ICD-10-CM

## 2021-06-12 DIAGNOSIS — F419 Anxiety disorder, unspecified: Secondary | ICD-10-CM | POA: Insufficient documentation

## 2021-06-12 DIAGNOSIS — Z8616 Personal history of COVID-19: Secondary | ICD-10-CM | POA: Insufficient documentation

## 2021-06-12 DIAGNOSIS — Z842 Family history of other diseases of the genitourinary system: Secondary | ICD-10-CM | POA: Diagnosis not present

## 2021-06-12 DIAGNOSIS — Z800 Family history of malignant neoplasm of digestive organs: Secondary | ICD-10-CM | POA: Diagnosis not present

## 2021-06-12 DIAGNOSIS — Z8041 Family history of malignant neoplasm of ovary: Secondary | ICD-10-CM | POA: Insufficient documentation

## 2021-06-12 DIAGNOSIS — Z90722 Acquired absence of ovaries, bilateral: Secondary | ICD-10-CM | POA: Insufficient documentation

## 2021-06-12 DIAGNOSIS — D259 Leiomyoma of uterus, unspecified: Secondary | ICD-10-CM | POA: Insufficient documentation

## 2021-06-12 DIAGNOSIS — Z803 Family history of malignant neoplasm of breast: Secondary | ICD-10-CM | POA: Insufficient documentation

## 2021-06-12 DIAGNOSIS — R112 Nausea with vomiting, unspecified: Secondary | ICD-10-CM | POA: Insufficient documentation

## 2021-06-12 DIAGNOSIS — Z809 Family history of malignant neoplasm, unspecified: Secondary | ICD-10-CM | POA: Diagnosis not present

## 2021-06-12 DIAGNOSIS — Z8719 Personal history of other diseases of the digestive system: Secondary | ICD-10-CM | POA: Insufficient documentation

## 2021-06-12 DIAGNOSIS — C562 Malignant neoplasm of left ovary: Secondary | ICD-10-CM | POA: Insufficient documentation

## 2021-06-12 DIAGNOSIS — Z9049 Acquired absence of other specified parts of digestive tract: Secondary | ICD-10-CM | POA: Insufficient documentation

## 2021-06-12 DIAGNOSIS — R531 Weakness: Secondary | ICD-10-CM | POA: Diagnosis not present

## 2021-06-12 DIAGNOSIS — F32A Depression, unspecified: Secondary | ICD-10-CM | POA: Diagnosis not present

## 2021-06-12 DIAGNOSIS — R1114 Bilious vomiting: Secondary | ICD-10-CM

## 2021-06-12 LAB — CMP (CANCER CENTER ONLY)
ALT: 8 U/L (ref 0–44)
AST: 15 U/L (ref 15–41)
Albumin: 4 g/dL (ref 3.5–5.0)
Alkaline Phosphatase: 83 U/L (ref 38–126)
Anion gap: 11 (ref 5–15)
BUN: 7 mg/dL (ref 6–20)
CO2: 25 mmol/L (ref 22–32)
Calcium: 9.8 mg/dL (ref 8.9–10.3)
Chloride: 105 mmol/L (ref 98–111)
Creatinine: 0.72 mg/dL (ref 0.44–1.00)
GFR, Estimated: >60 mL/min (ref >60)
Glucose, Bld: 114 mg/dL — ABNORMAL HIGH (ref 70–99)
Potassium: 3.3 mmol/L — ABNORMAL LOW (ref 3.5–5.1)
Sodium: 141 mmol/L (ref 135–145)
Total Bilirubin: 0.5 mg/dL (ref 0.3–1.2)
Total Protein: 7.8 g/dL (ref 6.5–8.1)

## 2021-06-12 LAB — CBC (CANCER CENTER ONLY)
HCT: 34.8 % — ABNORMAL LOW (ref 36.0–46.0)
Hemoglobin: 10.9 g/dL — ABNORMAL LOW (ref 12.0–15.0)
MCH: 25.8 pg — ABNORMAL LOW (ref 26.0–34.0)
MCHC: 31.3 g/dL (ref 30.0–36.0)
MCV: 82.5 fL (ref 80.0–100.0)
Platelet Count: 266 10*3/uL (ref 150–400)
RBC: 4.22 MIL/uL (ref 3.87–5.11)
RDW: 15.9 % — ABNORMAL HIGH (ref 11.5–15.5)
WBC Count: 5.8 10*3/uL (ref 4.0–10.5)
nRBC: 0 % (ref 0.0–0.2)

## 2021-06-12 MED ORDER — METOCLOPRAMIDE HCL 10 MG PO TABS
10.0000 mg | ORAL_TABLET | Freq: Three times a day (TID) | ORAL | 1 refills | Status: AC | PRN
Start: 2021-06-12 — End: ?
  Filled 2021-06-12: qty 30, 10d supply, fill #0
  Filled 2021-09-19: qty 30, 10d supply, fill #1

## 2021-06-12 MED ORDER — FAMOTIDINE 20 MG PO TABS
20.0000 mg | ORAL_TABLET | Freq: Every day | ORAL | 3 refills | Status: AC
  Filled 2021-06-12: qty 30, 30d supply, fill #0

## 2021-06-12 MED ORDER — PROCHLORPERAZINE MALEATE 10 MG PO TABS
10.0000 mg | ORAL_TABLET | Freq: Four times a day (QID) | ORAL | 0 refills | Status: DC | PRN
Start: 2021-06-12 — End: 2021-07-09
  Filled 2021-06-12: qty 30, 8d supply, fill #0

## 2021-06-12 NOTE — Progress Notes (Signed)
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 There is a 16 cm complex cystic mass in the pelvis containing thickened septations located near midline, abutting the superior aspect of the uterus. I suspect this mass is adnexal/ovarian in origin rather than uterine in origin. Specifically, I suspect the mass likely arises from the left ovary/adnexa and is most consistent with a neoplasm. Malignancy is certainly not excluded on this study. Recommend a pelvic ultrasound for further evaluation. 2. The rounded more solid-appearing structure in the right side of the pelvis is probably the right ovary. Recommend attention on ultrasound. 3. The small amount of fluid in the pelvis is likely reactive to the complex cystic mass likely rising from the left ovary/adnexa. 4. No cause for blood in vomit or black stools identified. No convincing evidence of a perforated or inflamed gastric or duodenal ulcer. 5. Hepatic steatosis.   07/02/2019 Imaging   MRI pelvis 1. 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 There are 2 enhancing nodules within the central upper abdomen raising the possibility of peritoneal carcinomatosis. Additionally, there is a small amount fluid within the pelvis. Malignant ascites not excluded. 2. Fibroid uterus. 3. Hepatic steatosis.   07/02/2019 Imaging   US pelvis 1. There is a large mass in the pelvis also seen on CT imaging. A separate left ovary is not visualized. 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 The mass is suspicious for malignancy. Recommend  gynecologic consultation. 2. The right ovary demonstrates an irregular ill-defined hypoechoic hypervascular region centrally. The findings are nonspecific in the right ovary. However, given the apparent left ovarian neoplasm suspicious for malignancy, recommend an MRI to further assess the right ovary. 3. Uterine fibroid measuring 3.6 cm.     07/21/2019 Pathology Results   FINAL MICROSCOPIC DIAGNOSIS: A. 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 - See oncology table. B. OMENTUM, RESECTION: - Omental lymph node with metastatic carcinoma (1/1). - Omental adipose tissue with foci of inflammation and reactive mesothelial changes. C. FALLOPIAN TUBE, LEFT, RESECTION: - Fallopian tube with focal, mild inflammation and fibrosis. - No tumor identified. D. UTERUS, CERVIX, RIGHT TUBE AND OVARY: - Cervix Nabothian cyst and squamous metaplasia. - Endometrium Proliferative. No hyperplasia or carcinoma. - Myometrium Leiomyomata. No malignancy. -Right ovary Poorly differentiated carcinoma, 4.8 cm. See oncology table and comment. -Right Fallopian tube Benign paratubal cyst. No endometriosis or malignancy. ONCOLOGY TABLE: OVARY or FALLOPIAN TUBE or PRIMARY PERITONEUM: Procedure: Bilateral f-oophorectomy, hysterectomy and omentectomy. Specimen Integrity: Intact Tumor Site: Left ovary and right ovary. See comment. Ovarian Surface Involvement: Not identified. Fallopian Tube Surface Involvement: Not identified. Tumor Size: Left ovary: 18.6 x 16.0 x 7.2 cm. Right ovary: 4.8 x 3.2 x 3.2 cm. Histologic Type: Clear cell carcinoma. See comment. Histologic Grade: High-grade. Other Tissue/ Organ Involvement: Omental lymph node with metastatic carcinoma. Peritoneal/Ascitic Fluid: Pending. Treatment Effect: No known presurgical therapy. Pathologic Stage Classification (pTNM, AJCC 8th Edition): pT3a, pN1a. Representative Tumor Block: A2, A3, A4, A5 A6, A7 and  A8. Comment(s): The left ovarian tumor is 18.6 cm in greatest  dimension and has features of clear cell carcinoma. The right ovarian tumor is 4.8 cm in greatest dimension and is poorly differentiated with focal features consistent with clear cell carcinoma. The pattern of involvement suggests that the right ovarian tumor is likely metastatic from the larger left ovarian clear cell carcinoma. Sections of the omentum show a lymph node with metastatic carcinoma. Sections of the remainder of the omentum do not show involvement by carcinoma.   07/21/2019 Surgery   Procedure(s) Performed: Exploratory laparotomy with radical tumor debulking including total hysterectomy, bilateral salpingo-oophorectomy, infra-gastric omentectomy, and washings.   Surgeon: Jeral Pinch, MD    Operative Findings: On EUA, mobile uterus and ovarian mass, situated out of the pelvis, spanning 6-8 cm above the umbilicus.  On intra-abdominal entry, inflamed omentum adherent to much of the 16 cm left ovarian mass, adhesions easily lysed bluntly.  No nodularity of the ovarian mass however on delivery of the mass through the midline incision, there was Intra-Op rupture of one of the smaller cystic components with drainage of clear brown-tinged fluid.  Grossly normal-appearing left fallopian tube as well as right tube.  Right ovary mildly enlarged measuring approximately 4 cm and firm/nodular, suspicious for tumor involvement.  Uterus 8-10 cm with 4 cm anterior mid body fibroid.  Some very small volume miliary disease along right pelvic sidewall, removed with uterine specimen.  Evidence of small diverticular disease.  Small bowel normal appearing although multiple loops of bowel adherent to each other, especially by mesentery, and an inflammatory manner.  An approximately 2 x 2 centimeter nodule suspicious for tumor implant was found in the omentum just inferior to the greater curvature of the stomach.  Additional small (less than 5 mm)  implants noted on the stomach and posterior aspect of the left lobe of the liver.  Otherwise the liver surface and diaphragm were smooth.   08/03/2019 Cancer Staging   Staging form: Ovary, Fallopian Tube, and Primary Peritoneal Carcinoma, AJCC 8th Edition - Pathologic: Stage IIIA2 (pT3a, pN1, cM0) - Signed by Heath Lark, MD on 08/03/2019    08/12/2019 Procedure   Placement of single lumen port a cath via right internal jugular vein. The catheter tip lies at the cavo-atrial junction. A power injectable port a cath was placed and is ready for immediate use.   08/22/2019 -  Chemotherapy   The patient had carboplatin and taxol for chemotherapy treatment.     09/23/2019 Genetic Testing   Negative genetic testing:  No pathogenic variants detected on the [facility] TumorNext-HRD+CancerNext paired germline and somatic genetic test. The report date is 09/23/2019.  The TumorNext-HRD+CancerNext test offered by [facility] includes paired germline and tumor analyses of 11 genes associated with homologous recombination repair (ATM, BARD1, BRIP1, CHEK2, MRE11A, NBN, PALB2, RAD51C, RAD51D, BRCA1, BRCA2) plus germline analyses of 26 additional genes associated with hereditary cancer (APC, AXIN2, BMPR1A, CDH1, CDK4, CDKN2A, DICER1, HOXB13, EPCAM, GREM1, MLH1, MSH2, MSH3, MSH6, MUTYH, NF1, NTHL1, PMS2, POLD1, POLE, PTEN, RECQL, SMAD4, SMARCA4, STK11, and TP53).   11/14/2019 Tumor Marker   Patient's tumor was tested for the following markers: CA-125 Results of the tumor marker test revealed 10.2.   12/05/2019 Tumor Marker   Patient's tumor was tested for the following markers: CA-125 Results of the tumor marker test revealed 9.9   01/05/2020 Imaging   No specific findings of active malignancy. Interval resection of the pelvic mass. Subtle nodularity along the right side of the vaginal cuff merits surveillance.     01/05/2020 Tumor Marker   Patient's  tumor was tested for the following markers: CA-125 Results of the tumor  marker test revealed 9.7   02/17/2020 Tumor Marker   Patient's tumor was tested for the following markers: CA-125 Results of the tumor marker test revealed 9.6   04/05/2020 Imaging   1. There is some minimal stranding along the mesentery and omentum without overt omental caking or well-defined nodularity. Faint bandlike density along the anterior peritoneal reflection in the pelvis, slightly more notable than on prior. Overall the appearance is not considered specific for recurrence but given the slight increase in prominence from 01/05/2020, would encourage careful surveillance by tumor markers and imaging. 2. Mild lower lumbar degenerative facet arthropathy.   04/05/2020 Tumor Marker   Patient's tumor was tested for the following markers: CA-125 Results of the tumor marker test revealed 11   05/29/2020 Tumor Marker   Patient's tumor was tested for the following markers: CA-125. Results of the tumor marker test revealed 17.   07/04/2020 Tumor Marker   Patient's tumor was tested for the following markers: CA-125. Results of the tumor marker test revealed 20.1   07/16/2020 Imaging   Stable mild stranding throughout mesenteric and omental fat, without evidence of discrete masses or ascites. No evidence of new or progressive disease.   08/29/2020 Tumor Marker   Patient's tumor was tested for the following markers: CA-125 Results of the tumor marker test revealed 32.7.   09/07/2020 Imaging   1. Interval progression of peritoneal disease within the abdomen and pelvis. Tumor predominantly involves the serosal surface of the large and small bowel loops. No signs of bowel obstruction identified at this time. 2. No evidence for metastatic disease to the chest.   09/19/2020 Imaging   1. No acute findings identified within the abdomen or pelvis. No evidence for bowel obstruction. 2. Similar appearance of soft tissue thickening along the peritoneal reflections within the abdomen and pelvis compatible  with known peritoneal disease. Recently characterized increased soft tissue along the surface of the jejunal bowel loops and tethering of pelvic small bowel loops is difficult to quantify (and compared with previous exam) due to lack of IV contrast material.     09/20/2020 -  Chemotherapy    Patient is on Treatment Plan: OVARIAN RECURRENT 3RD LINE CARBOPLATIN D1 / GEMCITABINE D1,8 (4/800) Q21D  Carboplatin was discontinued after 10/15/2020 due to allergic reaction      10/01/2020 Tumor Marker   Patient's tumor was tested for the following markers: CA-125. Results of the tumor marker test revealed 37.2.   10/15/2020 Tumor Marker   Patient's tumor was tested for the following markers: CA-125. Results of the tumor marker test revealed 48   10/22/2020 Tumor Marker   Patient's tumor was tested for the following markers: CA-125 Results of the tumor marker test revealed 28.5   11/05/2020 Tumor Marker   Patient's tumor was tested for the following markers: CA-125 Results of the tumor marker test revealed 18.9   12/17/2020 Imaging   1. No significant change in soft tissue thickening and nodularity in the low pelvis, with minimal thickening of the peritoneum throughout the abdomen. 2. Similar appearance of soft tissue thickening about the mid small bowel in the central, ventral abdomen. 3. Findings are consistent with stable peritoneal metastatic disease. 4. Status post hysterectomy and cholecystectomy.   12/18/2020 Tumor Marker   Patient's tumor was tested for the following markers: CA-125 Results of the tumor marker test revealed 10.8   01/11/2021 Imaging   Findings concerning for small bowel obstruction.  01/11/2021 Tumor Marker   Patient's tumor was tested for the following markers: CA-125. Results of the tumor marker test revealed 10.4.   01/11/2021 Imaging   CT abdomen and pelvis Mildly prominent proximal loops of small bowel although no true transition zone is seen in the distal  small bowel appears within normal limits. These changes could represent early partial small bowel obstruction. The overall appearance however is similar to that seen on prior CT from May of 2022.   Mild peritoneal thickening similar to that seen on the prior exam.   Fatty liver.   No other focal abnormality is noted.     Interval History: Patient presents today for follow-up after drain removal about 2 weeks ago.  She notes overall doing well although has been struggling with some issues discussed below.  Appetite has been up and down.  She overall feels hungry and has the desire to eat but has almost constant nausea and on average 1 episode of emesis a day.  When she does have emesis, there is not food just mucus appearing liquid and bile that she throws up.  She feels pain and like the food sits in the same area of her abdomen as she has felt previously.  We know now from surgery as well as imaging that she has multiple matted loops of bowel that are adherent to her abdominal wall.  Nausea has significantly worsened since she ran out of omeprazole 4 days ago.  Has been using Zofran and Phenergan as needed but these do not help as long as they did in regards to her nausea.  She is having regular daily bowel movements with the use of MiraLAX and Colace and describes her stool is formed.  She denies any urinary symptoms.  She feels overall weak and tired.  She spends a lot of her time in bed and in a chair.  He also endorses anxiety and depression.  She does not feel as happy as she normally does and has been worried a lot about financial stressors and work.  She misses being at work and has not worked since she was a teenager.  Past Medical/Surgical History: Past Medical History:  Diagnosis Date   Anemia due to antineoplastic chemotherapy    Arthritis    Asthma due to seasonal allergies    01-21-2021 per pt seasonal and exercised induced, does not have inhaler, last used one few years    Chronic abdominal pain 01/2021   Chronic nausea    01-21-2021  per pt intermittant nausea and when has abd pain most of time will have vomiting   Family history of breast cancer    Family history of colon cancer    Family history of ovarian cancer    Family history of prostate cancer    History of 2019 novel coronavirus disease (COVID-19) 09/08/2020   positive results in epic, per pt mild symptoms that resolved   History of Helicobacter pylori infection 07/2016   E[facility] w/ bx's by [facility],  chronic h.pylori w/ gastritis,  treated  (no ulcer)   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;  progression w/ abdominal carcinomatosis   On total parenteral nutrition (TPN) 02/02/2021   infused thru PAC   Port-A-Cath in place    Steroid-induced diabetes ([place])    followed by dr Alvy Bimler    Past Surgical History:  Procedure Laterality  Date   CHOLECYSTECTOMY N/A 02/16/2015   Procedure: LAPAROSCOPIC CHOLECYSTECTOMY WITH INTRAOPERATIVE CHOLANGIOGRAM;  Surgeon: Johnathan Hausen, MD;  Location: [facility];  Service: General;  Laterality: N/A;   INCISION AND DRAINAGE ABSCESS N/A 02/02/2021   Procedure: INCISION AND DRAINAGE ABSCESS;  Surgeon: Lahoma Crocker, MD;  Location: [facility];  Service: Gynecology;  Laterality: N/A;   IR IMAGING GUIDED PORT INSERTION  08/12/2019   IR REMOVAL TUN ACCESS W/ PORT W/O FL MOD SED  03/18/2021   LAPAROSCOPY N/A 01/23/2021   Procedure: LAPAROSCOPY DIAGNOSTIC;  Surgeon: Lafonda Mosses, MD;  Location[facility];  Service: Gynecology;  Laterality: N/A;   LAPAROTOMY N/A 01/23/2021   Procedure: LAPAROTOMY, ENTEROTOMY REPAIR OF THE BOWEL;  Surgeon: Lafonda Mosses, MD;  Location: [facility];  Service: Gynecology;  Laterality: N/A;   LAPAROTOMY N/A 05/03/2021   Procedure: incision and drainage abdominal collection  and placement drainage catheter;  Surgeon: Lafonda Mosses, MD;  Location: [facility];  Service: Gynecology;  Laterality: N/A;   SALPINGOOPHORECTOMY N/A 07/21/2019   Procedure: EXPLORATORY LAPAROTOMY, TOTAL ABDOMINAL HYSTERECTOMY, BILATERAL SALPINGO OOPHORECTOMY, OMENTECTOMY;  Surgeon: Lafonda Mosses, MD;  Location: [facility];  Service: Gynecology;  Laterality: N/A;   UPPER GASTROINTESTINAL ENDOSCOPY  08/01/2016   by dr Corinda G[place]ler in [place] N/A 03/11/2021   Procedure: OPENING AND EXPLORATION OF ABDOMINAL EXCISION;  Surgeon: Lafonda Mosses, MD;  Location: [facility];  Service: Gynecology;  Laterality: N/A;    Family History  Problem Relation Age of Onset   Colon cancer Father 59   Prostate cancer Father 55   Colon cancer Paternal Aunt        dx. early 76s   Ovarian cancer Paternal Grandmother 67   Breast cancer Cousin    Endometriosis Mother    Cancer Paternal Uncle        unknown type, dx. early 49s   Cancer Paternal Uncle 68       unknown type   Esophageal cancer Neg Hx     Social History   Socioeconomic History   Marital status: Married    Spouse name: Herbie [place]   Number of children: 2   Years of education: Not on file   Highest education level: Not on file  Occupational History   Not on file  Tobacco Use   Smoking status: Never   Smokeless tobacco: Never  Vaping Use   Vaping Use: Never used  Substance and Sexual Activity   Alcohol use: Yes    Comment: Social drinker   Drug use: Never   Sexual activity: Yes    Birth control/protection: Surgical  Other Topics Concern   Not on file  Social History Narrative   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  Newly married as of October 2020.   2 boys, age 34 and 98   Social Determinants of Health   Emergency planning/management officer Strain: Not on file  Food Insecurity: Not on file  Transportation Needs: Not on file  Physical Activity: Not on file  Stress: Not on file  Social Connections: Not on  file    Current Medications:  Current Outpatient Medications:    acetaminophen (TYLENOL) 325 MG tablet, Take 650 mg by mouth every 6 (six) hours as needed for mild pain, fever or headache., Disp: , Rfl:    acetaminophen (TYLENOL) 650 MG suppository, Place 1 suppository (650 mg total) rectally every 6 (six) hours as needed for moderate pain., Disp:  15 suppository, Rfl: 2   Docusate Sodium (COLACE PO), Take by mouth as needed., Disp: , Rfl:    famotidine (PEPCID) 20 MG tablet, Take 1 tablet (20 mg total) by mouth daily., Disp: 30 tablet, Rfl: 3   LORazepam (ATIVAN) 0.5 MG tablet, Take 1 tablet by mouth every 8 hours as needed for anxiety/insomnia. Do not take and drive., Disp: 15 tablet, Rfl: 0   omeprazole (PRILOSEC) 40 MG capsule, Take 1 capsule (40 mg total) by mouth daily., Disp: 30 capsule, Rfl: 3   ondansetron (ZOFRAN ODT) 4 MG disintegrating tablet, Take 1 tablet (4 mg total) by mouth every 4 (four) hours as needed for up to 20 doses for nausea or vomiting., Disp: 20 tablet, Rfl: 0   oxyCODONE (OXY IR/ROXICODONE) 5 MG immediate release tablet, Take 1 tablet (5 mg total) by mouth every 4 (four) hours as needed for severe pain. Do not take and drive, Disp: 30 tablet, Rfl: 0   Polyethylene Glycol 3350 (MIRALAX PO), Take by mouth., Disp: , Rfl:    prochlorperazine (COMPAZINE) 10 MG tablet, Take 1 tablet (10 mg total) by mouth every 6 (six) hours as needed for nausea or vomiting., Disp: 30 tablet, Rfl: 0   promethazine (PHENERGAN) 12.5 MG suppository, Place 1 suppository (12.5 mg total) rectally every 8 (eight) hours as needed for up to 30 doses for nausea or vomiting., Disp: 30 each, Rfl: 0   Accu-Chek Softclix Lancets lancets, Use as instructed (Patient not taking: Reported on 06/11/2021), Disp: 100 each, Rfl: 12   glucose blood test strip, Use as instructed (Patient not taking: Reported on 06/11/2021), Disp: 100 each, Rfl: 12   lidocaine-prilocaine (EMLA) cream, APPLY TO THE AFFECTED AREA AS  DIRECTED TO ACCESS PORT (Patient not taking: No sig reported), Disp: 30 g, Rfl: 11   NOVOLOG 100 UNIT/ML injection, Inject into the skin. (Patient not taking: Reported on 06/11/2021), Disp: , Rfl:   Review of Systems: Pertinent positives include appetite changes, fatigue, hot flashes, headache, anxiety, depression, nausea, emesis. Denies fevers, chills. Denies hearing loss, neck lumps or masses, mouth sores, ringing in ears or voice changes. Denies cough or wheezing.  Denies shortness of breath. Denies chest pain or palpitations. Denies leg swelling. Denies abdominal distention, pain, blood in stools, constipation, diarrhea. Denies pain with intercourse, dysuria, frequency, hematuria or incontinence. Denies hot flashes, pelvic pain, vaginal bleeding or vaginal discharge.   Denies joint pain, back pain or muscle pain/cramps. Denies itching, rash, or wounds. Denies dizziness, numbness or seizures. Denies swollen lymph nodes or glands, denies easy bruising or bleeding. Denies confusion, or decreased concentration.  Physical Exam: BP 112/85 (BP Location: Left Arm, Patient Position: Sitting)   Pulse 92   Temp 98 F (36.7 C) (Tympanic)   Resp 16   Ht _0  (1.575 m)   Wt 165 lb (74.8 kg)   LMP 07/05/2019   SpO2 100%   BMI 30.18 kg/m  General: Alert, oriented, no acute distress. HEENT: Normocephalic, atraumatic, sclera anicteric. Chest: Unlabored breathing on room air. Abdomen: soft, nontender.  Normoactive bowel sounds.  No masses or hepatosplenomegaly appreciated.  Well-healed incisions.  No erythema, induration or fluctuance around the area of prior fluid collections/abscesses. Extremities: Grossly normal range of motion.  Warm, well perfused.  No edema bilaterally.  Laboratory & Radiologic Studies: None new  Assessment & Plan: Sarah Newman is a 45 y.o. woman with history of ovarian cancer who is recent course has been complicated by development of a postoperative EC fistula  over  the summer requiring bowel rest and TPN, most recently with recollection of subcutaneous abscess requiring drainage.  Presents today for follow-up after recent drain removed with last 2 CTs not showing definitive evidence of persistent EC fistula.  Discussed number of emotional, physical and financial stressors over the last year. Patient is part of a support group online. Also discussed cancer center support groups for our gyn oncology patients. Discussed other interventions that could be useful such as meditation.   In terms of nausea, I still think this is likely related to GI dysmotility. I'd like her to restart omeprazole and I'm going to add an H2 blocker. We discussed trying some other anti-nausea medications including compazine and reglan. I think that reglan may be beneficial if symptoms are related to slower upper GI transit.   Patient is scheduled to see one of our dieticians/nutritionists next week.   Will plan to get blood work today to assure no significant anemia or other abnormalities that may be contributing to symptoms (fatigue, nausea).   Will also get a CA-125 today. Plan to repeat CT scan in 3 months.   38 minutes of total time was spent for this patient encounter, including preparation, face-to-face counseling with the patient and coordination of care, and documentation of the encounter.  Jeral Pinch, MD  Division of Gynecologic Oncology  Department of Obstetrics and Gynecology  [facility]

## 2021-06-12 NOTE — Patient Instructions (Signed)
I've sent the two new medications to your pharmacy. The famotidine is the one that I want you to start with the omeprazole. The compazine is the nausea medication that you can take if needed. Don't take this one more than one every 8 hours.

## 2021-06-13 ENCOUNTER — Other Ambulatory Visit (HOSPITAL_COMMUNITY): Payer: Self-pay

## 2021-06-13 ENCOUNTER — Telehealth: Payer: Self-pay | Admitting: Gynecologic Oncology

## 2021-06-13 ENCOUNTER — Encounter: Payer: Self-pay | Admitting: Hematology and Oncology

## 2021-06-13 DIAGNOSIS — C563 Malignant neoplasm of bilateral ovaries: Secondary | ICD-10-CM

## 2021-06-13 LAB — CA 125: Cancer Antigen (CA) 125: 35 U/mL (ref 0.0–38.1)

## 2021-06-13 MED ORDER — LORAZEPAM 0.5 MG PO TABS
0.5000 mg | ORAL_TABLET | Freq: Three times a day (TID) | ORAL | 0 refills | Status: DC | PRN
  Filled 2021-06-13: qty 10, 4d supply, fill #0

## 2021-06-13 NOTE — Telephone Encounter (Signed)
Called patient and discussed recent CA 125 results with Dr. Charisse March recommendations for repeat in one month. She states she is doing well today with no nausea with the pepcid helping her symptoms. Also advised she should be hearing from the social work department to set up counseling/support. She is advised to call for any needs, concerns, or new/worsening symptoms.

## 2021-06-17 ENCOUNTER — Inpatient Hospital Stay: Payer: Medicaid Other | Admitting: *Deleted

## 2021-06-17 ENCOUNTER — Inpatient Hospital Stay: Payer: Medicaid Other

## 2021-06-17 NOTE — Progress Notes (Signed)
Nutrition  Patient did not show up for nutrition appointment today.  Message sent to scheduling to offer another appointment.  Provider informed as well.   Jadzia Ibsen B. Zenia Resides, [place], [place] Registered Dietitian [phone] (mobile)

## 2021-06-18 ENCOUNTER — Telehealth: Payer: Self-pay

## 2021-06-18 NOTE — Telephone Encounter (Signed)
Scheduled per sch msg. Called, not able to leave msg

## 2021-06-21 ENCOUNTER — Encounter: Payer: Self-pay | Admitting: Gynecologic Oncology

## 2021-06-24 ENCOUNTER — Encounter: Payer: Self-pay | Admitting: Nutrition

## 2021-06-24 ENCOUNTER — Inpatient Hospital Stay: Payer: Medicaid Other | Admitting: Nutrition

## 2021-06-24 NOTE — Progress Notes (Unsigned)
Patient did not show for nutrition appointment. This is her second no show for nutrition counseling.

## 2021-07-02 ENCOUNTER — Inpatient Hospital Stay: Payer: Medicaid Other | Admitting: *Deleted

## 2021-07-02 DIAGNOSIS — C563 Malignant neoplasm of bilateral ovaries: Secondary | ICD-10-CM

## 2021-07-03 ENCOUNTER — Encounter: Payer: Self-pay | Admitting: Hematology and Oncology

## 2021-07-03 NOTE — Progress Notes (Signed)
[place] [facility]  Clinical Social Work was referred by [employer] oncology for assessment of psychosocial needs, specifically counseling support.  Clinical Social Worker contacted patient by phone for telephonic counseling visit.  Sarah Newman shared personal history, primary stressors, positive coping skills, and barriers.  She identified a strong support system including her spouse, sons, extended family, and friends.  She enjoys spending time with family, singing, and has recently begun writing her story.  CSW explored positive coping skills patient currently utilizes when feeling immediate sadness/anxiety and discussed building upon new skills to help cope with a chronic condition.  The patient is very motivated to explore new modalities to help adjust to life after cancer and "build new identity" after many losses related to cancer diagnosis (career, income, independence).  CSW and patient plan to meet next week for follow up session. CSW provided the following resources to patient:   Financial: -Casting-for-Hope-2022-Application.pdf ([email or web address]/)  -Ovarian Cancer Financial Assistance Program Is this the one you've already completed? - Free Meal Delivery - [email or web address]   -Professional Counseling - [email or web address] (I have no feedback on this program yet) -Steps Through OC: Support for Life with Ovarian Cancer I encourage you to check this out- it's a 6 month program specifically for ovarian cancer.   -GYN support group- I facilitate this group and have added you to our database so you can receive invitation -Reiki- offered at the cancer center, we can sign you up at our next session -Massage - 2 free massages for you and your spouse- if interested, I can give you gift certificates at our next session -[place] classes- journaling, meditation, painting, and more!! - I've attached the December calendar and will ask for you to be added to our mailing list -Cooking class- at  cancer center, it's fun and DELICIOUS food prepared by a chef/dietitian  -[employer]- you are all signed up for Friday!    Gwinda Maine, LCSW  Clinical Social Worker [facility]

## 2021-07-08 ENCOUNTER — Other Ambulatory Visit: Payer: Self-pay | Admitting: Gynecologic Oncology

## 2021-07-08 ENCOUNTER — Inpatient Hospital Stay (HOSPITAL_BASED_OUTPATIENT_CLINIC_OR_DEPARTMENT_OTHER): Payer: Medicaid Other | Admitting: Gynecologic Oncology

## 2021-07-08 ENCOUNTER — Inpatient Hospital Stay: Payer: Medicaid Other | Admitting: *Deleted

## 2021-07-08 ENCOUNTER — Telehealth: Payer: Self-pay

## 2021-07-08 ENCOUNTER — Encounter: Payer: Medicaid Other | Admitting: Gynecologic Oncology

## 2021-07-08 ENCOUNTER — Ambulatory Visit (HOSPITAL_COMMUNITY)
Admission: RE | Admit: 2021-07-08 | Discharge: 2021-07-08 | Disposition: A | Discharge status: Home / Self Care | Payer: Medicaid Other | Source: Ambulatory Visit | Attending: Gynecologic Oncology | Admitting: Gynecologic Oncology

## 2021-07-08 ENCOUNTER — Inpatient Hospital Stay: Payer: Medicaid Other | Attending: Gynecologic Oncology

## 2021-07-08 ENCOUNTER — Other Ambulatory Visit: Payer: Self-pay

## 2021-07-08 ENCOUNTER — Encounter: Payer: Self-pay | Admitting: Gynecologic Oncology

## 2021-07-08 ENCOUNTER — Inpatient Hospital Stay: Payer: Medicaid Other | Admitting: Nutrition

## 2021-07-08 ENCOUNTER — Telehealth: Payer: Self-pay | Admitting: Gynecologic Oncology

## 2021-07-08 DIAGNOSIS — K632 Fistula of intestine: Secondary | ICD-10-CM | POA: Insufficient documentation

## 2021-07-08 DIAGNOSIS — R1900 Intra-abdominal and pelvic swelling, mass and lump, unspecified site: Secondary | ICD-10-CM | POA: Diagnosis not present

## 2021-07-08 DIAGNOSIS — R630 Anorexia: Secondary | ICD-10-CM | POA: Insufficient documentation

## 2021-07-08 DIAGNOSIS — Z90722 Acquired absence of ovaries, bilateral: Secondary | ICD-10-CM | POA: Insufficient documentation

## 2021-07-08 DIAGNOSIS — R222 Localized swelling, mass and lump, trunk: Secondary | ICD-10-CM | POA: Insufficient documentation

## 2021-07-08 DIAGNOSIS — C786 Secondary malignant neoplasm of retroperitoneum and peritoneum: Secondary | ICD-10-CM | POA: Insufficient documentation

## 2021-07-08 DIAGNOSIS — C563 Malignant neoplasm of bilateral ovaries: Secondary | ICD-10-CM | POA: Insufficient documentation

## 2021-07-08 DIAGNOSIS — R1010 Upper abdominal pain, unspecified: Secondary | ICD-10-CM

## 2021-07-08 DIAGNOSIS — C562 Malignant neoplasm of left ovary: Secondary | ICD-10-CM

## 2021-07-08 DIAGNOSIS — C775 Secondary and unspecified malignant neoplasm of intrapelvic lymph nodes: Secondary | ICD-10-CM | POA: Insufficient documentation

## 2021-07-08 DIAGNOSIS — Z90710 Acquired absence of both cervix and uterus: Secondary | ICD-10-CM | POA: Insufficient documentation

## 2021-07-08 LAB — CMP (CANCER CENTER ONLY)
ALT: 10 U/L (ref 0–44)
AST: 16 U/L (ref 15–41)
Albumin: 3.6 g/dL (ref 3.5–5.0)
Alkaline Phosphatase: 88 U/L (ref 38–126)
Anion gap: 12 (ref 5–15)
BUN: 8 mg/dL (ref 6–20)
CO2: 24 mmol/L (ref 22–32)
Calcium: 9.1 mg/dL (ref 8.9–10.3)
Chloride: 107 mmol/L (ref 98–111)
Creatinine: 0.71 mg/dL (ref 0.44–1.00)
GFR, Estimated: >60 mL/min (ref >60)
Glucose, Bld: 83 mg/dL (ref 70–99)
Potassium: 3.2 mmol/L — ABNORMAL LOW (ref 3.5–5.1)
Sodium: 143 mmol/L (ref 135–145)
Total Bilirubin: 0.4 mg/dL (ref 0.3–1.2)
Total Protein: 7.1 g/dL (ref 6.5–8.1)

## 2021-07-08 LAB — CBC WITH DIFFERENTIAL (CANCER CENTER ONLY)
Abs Immature Granulocytes: 0.02 10*3/uL (ref 0.00–0.07)
Basophils Absolute: 0 10*3/uL (ref 0.0–0.1)
Basophils Relative: 0 %
Eosinophils Absolute: 0.1 10*3/uL (ref 0.0–0.5)
Eosinophils Relative: 2 %
HCT: 32.3 % — ABNORMAL LOW (ref 36.0–46.0)
Hemoglobin: 10.1 g/dL — ABNORMAL LOW (ref 12.0–15.0)
Immature Granulocytes: 0 %
Lymphocytes Relative: 25 %
Lymphs Abs: 1.3 10*3/uL (ref 0.7–4.0)
MCH: 26.1 pg (ref 26.0–34.0)
MCHC: 31.3 g/dL (ref 30.0–36.0)
MCV: 83.5 fL (ref 80.0–100.0)
Monocytes Absolute: 0.3 10*3/uL (ref 0.1–1.0)
Monocytes Relative: 6 %
Neutro Abs: 3.4 10*3/uL (ref 1.7–7.7)
Neutrophils Relative %: 67 %
Platelet Count: 251 10*3/uL (ref 150–400)
RBC: 3.87 MIL/uL (ref 3.87–5.11)
RDW: 15.8 % — ABNORMAL HIGH (ref 11.5–15.5)
WBC Count: 5.1 10*3/uL (ref 4.0–10.5)
nRBC: 0 % (ref 0.0–0.2)

## 2021-07-08 MED ORDER — IOHEXOL 350 MG/ML SOLN
80.0000 mL | Freq: Once | INTRAVENOUS | Status: AC | PRN
  Administered 2021-07-08: 80 mL via INTRAVENOUS

## 2021-07-08 NOTE — Progress Notes (Signed)
Spoke with patient's insurance about stat CT AP. Additional clinicals given. CT approved. Valid until 08/07/2021. Auth # [id number].

## 2021-07-08 NOTE — Progress Notes (Signed)
See RN note. Given patient's symptoms, plan for CT scan to evaluate for subcutaneous collection that may need drainage.

## 2021-07-08 NOTE — Telephone Encounter (Signed)
Received call from Sarah Newman this morning. She is requesting an appointment. Patient reports her eating has slowed considerably, she is consuming small portion sizes and has pain with digestion. 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 It is firm to the touch and warm. She denies redness, fever or chills. Patient taking colace daily and BM's are regular.  Appointment scheduled with Joylene John, NP for today 07/08/21 at 1pm. Patient is in agreement of date and time of appointment.

## 2021-07-08 NOTE — Telephone Encounter (Signed)
Called the patient to discuss CT findings.  No obvious collection of fluid or abscess within the subcutaneous tissue.  Findings look similar to her last CT scan.  There is some enhancing tissue that I suspect is related to scar tissue from multiple surgeries in the area.  Oral contrast limits the evaluation of the central portion of her abdominal loops of bowel that we know significantly adherent to her anterior abdominal wall.  No evidence of small bowel obstruction.  Also discussed normal labs today.  She has been doing well with staying hydrated and I am surprised that her albumin is as good as it is.  Unfortunately, I think because her intake is low in terms of calories, she has continued to lose weight.  I will have the office call her to schedule an in person visit for Friday.  All questions answered tonight.  We discussed starting an appetite suppressant which we will review further at her visit on Friday.  Jeral Pinch MD Gynecologic Oncology

## 2021-07-09 ENCOUNTER — Encounter: Payer: Self-pay | Admitting: Hematology and Oncology

## 2021-07-09 ENCOUNTER — Other Ambulatory Visit: Payer: Self-pay | Admitting: Gynecologic Oncology

## 2021-07-09 ENCOUNTER — Other Ambulatory Visit (HOSPITAL_COMMUNITY): Payer: Self-pay

## 2021-07-09 ENCOUNTER — Telehealth: Payer: Self-pay | Admitting: Oncology

## 2021-07-09 DIAGNOSIS — R110 Nausea: Secondary | ICD-10-CM

## 2021-07-09 DIAGNOSIS — R1033 Periumbilical pain: Secondary | ICD-10-CM

## 2021-07-09 DIAGNOSIS — R630 Anorexia: Secondary | ICD-10-CM

## 2021-07-09 DIAGNOSIS — R1114 Bilious vomiting: Secondary | ICD-10-CM

## 2021-07-09 LAB — CA 125: Cancer Antigen (CA) 125: 42.7 U/mL — ABNORMAL HIGH (ref 0.0–38.1)

## 2021-07-09 MED ORDER — DEXAMETHASONE 4 MG PO TABS
2.0000 mg | ORAL_TABLET | Freq: Two times a day (BID) | ORAL | 0 refills | Status: AC
Start: 2021-07-09 — End: ?
  Filled 2021-07-09: qty 28, 28d supply, fill #0

## 2021-07-09 MED ORDER — OXYCODONE HCL 5 MG PO TABS
5.0000 mg | ORAL_TABLET | ORAL | 0 refills | Status: DC | PRN
  Filled 2021-07-09: qty 30, 5d supply, fill #0

## 2021-07-09 MED ORDER — PROCHLORPERAZINE MALEATE 10 MG PO TABS
10.0000 mg | ORAL_TABLET | Freq: Three times a day (TID) | ORAL | 0 refills | Status: AC | PRN
  Filled 2021-07-09: qty 30, 10d supply, fill #0

## 2021-07-09 NOTE — Telephone Encounter (Signed)
Called Sarah Newman and advised her of follow up apt with Dr. Berline Lopes on Friday, 07/12/21 at 12:45. Also discussed that her lab apt on Thursday has been canceled since her labs were done yesterday.  Discussed missed nutrition appointment and she would like to reschedule for Friday if possible.  Appointment scheduled at 9:45 on 07/12/21 with Vinnie Level.  She verbalized understanding and agreement of appointments.

## 2021-07-09 NOTE — Progress Notes (Signed)
GYN Oncology Symptom Management  [name] 45 y.o. 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 The fistula was managed with bowel rest and TPN. Oncology history is below: Treatment History:     Oncology History Overview Note   Clear cell features Negative genetics  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[place])   07/02/2019 Imaging     Ct scan of abdomen and pelvis 1. There is a 16 cm complex cystic mass in the pelvis containing thickened septations located near midline, abutting the superior aspect of the uterus. I suspect this mass is adnexal/ovarian in origin rather than uterine in origin. Specifically, I suspect the mass likely arises from the left ovary/adnexa and is most consistent with a neoplasm. Malignancy is certainly not excluded on this study. Recommend a pelvic ultrasound for further evaluation. 2. The rounded more solid-appearing structure in the right side of the pelvis is probably the right ovary. Recommend attention on ultrasound. 3. The small amount of fluid in the pelvis is likely reactive to the complex cystic mass likely rising from the left ovary/adnexa. 4. No cause for blood in vomit or black stools identified. No convincing evidence of a perforated or inflamed gastric or duodenal ulcer. 5. Hepatic steatosis.   07/02/2019 Imaging     MRI pelvis 1. There is a large (15 cm) solid and cystic mass which appears to be arising from the left ovary  concerning for cystic ovarian neoplasm. There are 2 enhancing nodules within the central upper abdomen raising the possibility of peritoneal carcinomatosis. Additionally, there is a small amount fluid within the pelvis. Malignant ascites not excluded. 2. Fibroid uterus. 3. Hepatic steatosis.   07/02/2019 Imaging     US pelvis 1. There is a large mass in the pelvis also seen on CT imaging. A separate left ovary is not visualized. 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 The mass is suspicious for malignancy. Recommend gynecologic consultation. 2. The right ovary demonstrates an irregular ill-defined hypoechoic hypervascular region centrally. The findings are nonspecific in the right ovary. However, given the apparent left ovarian neoplasm suspicious for malignancy, recommend an MRI to further assess the right ovary. 3. Uterine fibroid measuring 3.6 cm.     07/21/2019 Pathology Results     FINAL MICROSCOPIC DIAGNOSIS: A. 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 - See oncology table. B. OMENTUM, RESECTION: - Omental lymph node with metastatic carcinoma (1/1). - Omental adipose tissue with foci of inflammation and reactive mesothelial changes. C. FALLOPIAN TUBE, LEFT, RESECTION: - Fallopian tube with focal, mild inflammation and fibrosis. - No tumor identified. D. UTERUS, CERVIX, RIGHT TUBE AND OVARY: - Cervix Nabothian cyst and squamous metaplasia. - Endometrium Proliferative. No hyperplasia or carcinoma. - Myometrium Leiomyomata. No malignancy. -Right ovary Poorly differentiated carcinoma, 4.8 cm. See oncology table and comment. -Right Fallopian tube Benign paratubal cyst. No endometriosis or malignancy. ONCOLOGY TABLE: OVARY or FALLOPIAN TUBE or PRIMARY PERITONEUM: Procedure: Bilateral f-oophorectomy, hysterectomy  and omentectomy. Specimen Integrity: Intact Tumor Site: Left ovary and right ovary. See  comment. Ovarian Surface Involvement: Not identified. Fallopian Tube Surface Involvement: Not identified. Tumor Size: Left ovary: 18.6 x 16.0 x 7.2 cm. Right ovary: 4.8 x 3.2 x 3.2 cm. Histologic Type: Clear cell carcinoma. See comment. Histologic Grade: High-grade. Other Tissue/ Organ Involvement: Omental lymph node with metastatic carcinoma. Peritoneal/Ascitic Fluid: Pending. Treatment Effect: No known presurgical therapy. Pathologic Stage Classification (pTNM, AJCC 8th Edition): pT3a, pN1a. Representative Tumor Block: A2, A3, A4, A5 A6, A7 and A8. Comment(s): The left ovarian tumor is 18.6 cm in greatest dimension and has features of clear cell carcinoma. The right ovarian tumor is 4.8 cm in greatest dimension and is poorly differentiated with focal features consistent with clear cell carcinoma. The pattern of involvement suggests that the right ovarian tumor is likely metastatic from the larger left ovarian clear cell carcinoma. Sections of the omentum show a lymph node with metastatic carcinoma. Sections of the remainder of the omentum do not show involvement by carcinoma.     07/21/2019 Surgery     Procedure(s) Performed: Exploratory laparotomy with radical tumor debulking including total hysterectomy, bilateral salpingo-oophorectomy, infra-gastric omentectomy, and washings.   Surgeon: Jeral Pinch, MD    Operative Findings: On EUA, mobile uterus and ovarian mass, situated out of the pelvis, spanning 6-8 cm above the umbilicus.  On intra-abdominal entry, inflamed omentum adherent to much of the 16 cm left ovarian mass, adhesions easily lysed bluntly.  No nodularity of the ovarian mass however on delivery of the mass through the midline incision, there was Intra-Op rupture of one of the smaller cystic components with drainage of clear brown-tinged fluid.  Grossly normal-appearing left fallopian tube as well as right tube.  Right ovary mildly enlarged measuring approximately 4 cm and  firm/nodular, suspicious for tumor involvement.  Uterus 8-10 cm with 4 cm anterior mid body fibroid.  Some very small volume miliary disease along right pelvic sidewall, removed with uterine specimen.  Evidence of small diverticular disease.  Small bowel normal appearing although multiple loops of bowel adherent to each other, especially by mesentery, and an inflammatory manner.  An approximately 2 x 2 centimeter nodule suspicious for tumor implant was found in the omentum just inferior to the greater curvature of the stomach.  Additional small (less than 5 mm) implants noted on the stomach and posterior aspect of the left lobe of the liver.  Otherwise the liver surface and diaphragm were smooth.     08/03/2019 Cancer Staging     Staging form: Ovary, Fallopian Tube, and Primary Peritoneal Carcinoma, AJCC 8th Edition - Pathologic: Stage IIIA2 (pT3a, pN1, cM0) - Signed by Heath Lark, MD on 08/03/2019   08/12/2019 Procedure     Placement of single lumen port a cath via right internal jugular vein.     08/22/2019 -  Chemotherapy     The patient had carboplatin and taxol for chemotherapy treatment.    09/23/2019 Genetic Testing     Negative genetic testing:  No pathogenic variants detected on the [facility] TumorNext-HRD+CancerNext paired germline and somatic genetic test. The report date is 09/23/2019.   11/14/2019 Tumor Marker     CA-125 10.2.   12/05/2019 Tumor Marker     CA-125 9.9   01/05/2020 Imaging     No specific findings of active malignancy. Interval resection of the pelvic mass. Subtle nodularity along the right side of the vaginal cuff merits surveillance.   01/05/2020 Tumor Marker  CA-125 9.7   02/17/2020 Tumor Marker     CA-125 9.6   04/05/2020 Imaging     1. There is some minimal stranding along the mesentery and omentum without overt omental caking or well-defined nodularity. Faint bandlike density along the anterior peritoneal reflection in the pelvis, slightly more notable than on prior. Overall  the appearance is not considered specific for recurrence but given the slight increase in prominence from 01/05/2020, would encourage careful surveillance by tumor markers and imaging. 2. Mild lower lumbar degenerative facet arthropathy.   04/05/2020 Tumor Marker     CA-125 11   05/29/2020 Tumor Marker     CA-125 17   07/04/2020 Tumor Marker     CA-125 20.1   07/16/2020 Imaging     Stable mild stranding throughout mesenteric and omental fat, without evidence of discrete masses or ascites. No evidence of new or progressive disease.   08/29/2020 Tumor Marker     CA-125 32.7   09/07/2020 Imaging     1. Interval progression of peritoneal disease within the abdomen and pelvis. Tumor predominantly involves the serosal surface of the large and small bowel loops. No signs of bowel obstruction identified at this time. 2. No evidence for metastatic disease to the chest.   09/19/2020 Imaging     1. No acute findings identified within the abdomen or pelvis. No evidence for bowel obstruction. 2. Similar appearance of soft tissue thickening along the peritoneal reflections within the abdomen and pelvis compatible with known peritoneal disease. Recently characterized increased soft tissue along the surface of the jejunal bowel loops and tethering of pelvic small bowel loops is difficult to quantify (and compared with previous exam) due to lack of IV contrast material.   09/20/2020 -  Chemotherapy      Patient is on Treatment Plan: OVARIAN RECURRENT 3RD LINE CARBOPLATIN D1 / GEMCITABINE D1,8 (4/800) Q21D Carboplatin was discontinued after 10/15/2020 due to allergic reaction   10/01/2020 Tumor Marker     CA-125 37.2.   10/15/2020 Tumor Marker     CA-125 48   10/22/2020 Tumor Marker     CA-125 28.5   11/05/2020 Tumor Marker     CA-125 18.9   12/17/2020 Imaging     1. No significant change in soft tissue thickening and nodularity in the low pelvis, with minimal thickening of the peritoneum throughout the abdomen. 2.  Similar appearance of soft tissue thickening about the mid small bowel in the central, ventral abdomen. 3. Findings are consistent with stable peritoneal metastatic disease. 4. Status post hysterectomy and cholecystectomy.   12/18/2020 Tumor Marker     CA-125 10.8   01/11/2021 Imaging     Findings concerning for small bowel obstruction.   01/11/2021 Tumor Marker     CA-125 10.4.   01/11/2021 Imaging     CT abdomen and pelvis Mildly prominent proximal loops of small bowel although no true transition zone is seen in the distal small bowel appears within normal limits. These changes could represent early partial small bowel obstruction. The overall appearance however is similar to that seen on prior CT from May of 2022. Mild peritoneal thickening similar to that seen on the prior exam. Fatty liver. No other focal abnormality is noted.      Multiple CT scans of the abdomen pelvis: 02/02/2021, 02/03/2021, 03/01/2021, 03/15/2021, 04/04/2021, 05/03/2021, 05/17/2021, 05/31/2021   06/12/2021 CA 125 at 35.0    Interval History: She presents today for stat lab work along with a stat CT scan for  increasing abdominal pain with a palpable mass-like area in the upper abdomen close to the previous fistula drainage site. She called the office today requesting an appointment and spoke with our RN reporting her eating as slowed again and the area around where her fistula was is the size of a potato, firm and warm.   Patient is seen in the lab area due to having stat labs then need to get to stat CT. She states she feels she is at a standstill and not progressing. She continues to have moderately decreased appetite and difficulty eating. No nausea or emesis reported besides the episode of emesis she had today after drinking oral contrast for CT scan. She continues to take compazine and reglan, which seems to help. She is able to drink water slowly but avoids juice and sodas. She is able to small bites of cereal, grits,  scrambled eggs but reports pain in the upper abdomen after eating, with the sensation that the upper abdominal mass is interfering with food getting past. She also states she can feel the food moving past this area.   The mass-like area has been present in her upper abdomen and she reports an increase in soreness over the past several days. After eating, she has pain in this area and she feels like she has to work to get the food to pass. She relates the mass size to a potato and states it has not significantly changed in size. She did try a muscle relaxant, which seemed to relax the area. She also has soreness around the previous fistula drainage site. She denies erythema, increased warmth, or drainage in this area. No abnormal bleeding or discharge reported from the bladder, vagina, or rectum.   She has been having regular, solid bowel movements and passing flatus. She is voiding without difficulty. She denies fever/chills but reports having intermittent hot and cold flashes related to menopause. She has lost 20 lbs since July and is having to buy new clothes. She has not been able to connect with the dietician here at the [facility] and she is hopeful she may have some helpful ideas.          Review of Systems: Constitutional: Feels she is not progressing. No fever, chills, or weakness. Significantly decreased appetite with weight loss.                 Cardiovascular: No chest pain, shortness of breath, or edema.  Pulmonary: No cough or wheeze.  Gastrointestinal: Had episode of emesis after drinking oral contrast. No nausea or diarrhea. No bright red blood per rectum or change in bowel movement.  Genitourinary: No frequency, urgency, or dysuria. No vaginal bleeding or discharge.  Musculoskeletal: No myalgia or joint pain Neurologic: No new weakness, numbness, or change in gait.  Psychology: Has ativan if needed. Has met with Education officer, museum.  Current Meds:  Outpatient Encounter Medications as of  07/08/2021  Medication Sig   Accu-Chek Softclix Lancets lancets Use as instructed (Patient not taking: Reported on 06/11/2021)   acetaminophen (TYLENOL) 325 MG tablet Take 650 mg by mouth every 6 (six) hours as needed for mild pain, fever or headache.   acetaminophen (TYLENOL) 650 MG suppository Place 1 suppository (650 mg total) rectally every 6 (six) hours as needed for moderate pain.   Docusate Sodium (COLACE PO) Take by mouth as needed.   famotidine (PEPCID) 20 MG tablet Take 1 tablet (20 mg total) by mouth daily.   glucose blood test strip Use  as instructed (Patient not taking: Reported on 06/11/2021)   lidocaine-prilocaine (EMLA) cream APPLY TO THE AFFECTED AREA AS DIRECTED TO ACCESS PORT (Patient not taking: No sig reported)   LORazepam (ATIVAN) 0.5 MG tablet Take 1 tablet by mouth every 8 hours as needed for anxiety/insomnia. Do not take and drive.   metoCLOPramide (REGLAN) 10 MG tablet Take 1 tablet (10 mg total) by mouth every 8 (eight) hours as needed for nausea.   NOVOLOG 100 UNIT/ML injection Inject into the skin. (Patient not taking: Reported on 06/11/2021)   omeprazole (PRILOSEC) 40 MG capsule Take 1 capsule (40 mg total) by mouth daily.   ondansetron (ZOFRAN ODT) 4 MG disintegrating tablet Take 1 tablet (4 mg total) by mouth every 4 (four) hours as needed for up to 20 doses for nausea or vomiting.   oxyCODONE (OXY IR/ROXICODONE) 5 MG immediate release tablet Take 1 tablet (5 mg total) by mouth every 4 (four) hours as needed for severe pain. Do not take and drive   Polyethylene Glycol 3350 (MIRALAX PO) Take by mouth.   prochlorperazine (COMPAZINE) 10 MG tablet Take 1 tablet (10 mg total) by mouth every 6 (six) hours as needed for nausea or vomiting.   promethazine (PHENERGAN) 12.5 MG suppository Place 1 suppository (12.5 mg total) rectally every 8 (eight) hours as needed for up to 30 doses for nausea or vomiting.   No facility-administered encounter medications on file as of 07/08/2021.     Allergy:  Allergies  Allergen Reactions   Carboplatin Shortness Of Breath    Social Hx:   Social History   Socioeconomic History   Marital status: Married    Spouse name: Herbie Baltimore   Number of children: 2   Years of education: Not on file   Highest education level: Not on file  Occupational History   Not on file  Tobacco Use   Smoking status: Never   Smokeless tobacco: Never  Vaping Use   Vaping Use: Never used  Substance and Sexual Activity   Alcohol use: Yes    Comment: Social drinker   Drug use: Never   Sexual activity: Yes    Birth control/protection: Surgical  Other Topics Concern   Not on file  Social History Narrative   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  Newly married as of October 2020.   2 boys, age 63 and 89   Social Determinants of Health   Financial Resource Strain: Not on file  Food Insecurity: Not on file  Transportation Needs: Not on file  Physical Activity: Not on file  Stress: Not on file  Social Connections: Not on file  Intimate Partner Violence: Not on file    Past Surgical Hx:  Past Surgical History:  Procedure Laterality Date   CHOLECYSTECTOMY N/A 02/16/2015   Procedure: LAPAROSCOPIC CHOLECYSTECTOMY WITH INTRAOPERATIVE CHOLANGIOGRAM;  Surgeon: Johnathan Hausen, MD;  Location: [facility];  Service: General;  Laterality: N/A;   INCISION AND DRAINAGE ABSCESS N/A 02/02/2021   Procedure: INCISION AND DRAINAGE ABSCESS;  Surgeon: Lahoma Crocker, MD;  Location: [facility];  Service: Gynecology;  Laterality: N/A;   IR IMAGING GUIDED PORT INSERTION  08/12/2019   IR REMOVAL TUN ACCESS W/ PORT W/O FL MOD SED  03/18/2021   LAPAROSCOPY N/A 01/23/2021   Procedure: LAPAROSCOPY DIAGNOSTIC;  Surgeon: Lafonda Mosses, MD;  Location[facility];  Service: Gynecology;  Laterality: N/A;   LAPAROTOMY N/A 01/23/2021   Procedure: LAPAROTOMY, ENTEROTOMY REPAIR OF THE BOWEL;  Surgeon: Berline Lopes,  Corinna Lines, MD;  Location: [facility];  Service: Gynecology;  Laterality: N/A;   LAPAROTOMY N/A 05/03/2021   Procedure: incision and drainage abdominal collection and placement drainage catheter;  Surgeon: Lafonda Mosses, MD;  Location: [facility];  Service: Gynecology;  Laterality: N/A;   SALPINGOOPHORECTOMY N/A 07/21/2019   Procedure: EXPLORATORY LAPAROTOMY, TOTAL ABDOMINAL HYSTERECTOMY, BILATERAL SALPINGO OOPHORECTOMY, OMENTECTOMY;  Surgeon: Lafonda Mosses, MD;  Location: [facility];  Service: Gynecology;  Laterality: N/A;   UPPER GASTROINTESTINAL ENDOSCOPY  08/01/2016   by dr Corinda Gubler in [place] N/A 03/11/2021   Procedure: OPENING AND EXPLORATION OF ABDOMINAL EXCISION;  Surgeon: Lafonda Mosses, MD;  Location: [facility];  Service: Gynecology;  Laterality: N/A;    Past Medical Hx:  Past Medical History:  Diagnosis Date   Anemia due to antineoplastic chemotherapy    Arthritis    Asthma due to seasonal allergies    01-21-2021 per pt seasonal and exercised induced, does not have inhaler, last used one few years   Chronic abdominal pain 01/2021   Chronic nausea    01-21-2021  per pt intermittant nausea and when has abd pain most of time will have vomiting   Family history of breast cancer    Family history of colon cancer    Family history of ovarian cancer    Family history of prostate cancer    History of 2019 novel coronavirus disease (COVID-19) 09/08/2020   positive results in epic, per pt mild symptoms that resolved   History of Helicobacter pylori infection 07/2016   E[facility] w/ bx's by [facility],  chronic h.pylori w/ gastritis,  treated  (no ulcer)   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[place]) 07/2019   oncologist--- dr gorsuch/ dr Berline Lopes--- 07-21-2019 s/p exp. 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;  progression w/ abdominal carcinomatosis   On total parenteral nutrition (TPN) 02/02/2021   infused thru PAC   Port-A-Cath in place    Steroid-induced  diabetes ([place])    followed by dr Alvy Bimler    Family Hx:  Family History  Problem Relation Age of Onset   Colon cancer Father 64   Prostate cancer Father 53   Colon cancer Paternal Aunt        dx. early 37s   Ovarian cancer Paternal Grandmother 6   Breast cancer Cousin    Endometriosis Mother    Cancer Paternal Uncle        unknown type, dx. early 39s   Cancer Paternal Uncle 4       unknown type   Esophageal cancer Neg Hx     Vitals:  Last menstrual period 07/05/2019.  Physical Exam:  Alert, oriented, in no acute distress, does not appear ill. Abdomen is soft, active bowel sounds. Abdominal incisions are well healed. No erythema or drainage noted. In the upper abdomen above the umbilicus, a mass-like, firm area can be palpated in the subcutaneous tissue. No significant tenderness reported. It is around the size of a large lemon.     Assessment/Plan:  45 year old woman with history of ovarian cancer whose recent course has been complicated by development of a postoperative EC fistula over the summer requiring bowel rest and TPN, most recently with recollection of subcutaneous abscess requiring drainage. She presents to the office with increased abdominal soreness, decreased appetite, palpable mass in upper abdomen. Given her symptoms, plan to check stat lab work today to evaluate for possible infectious  process. We will also proceed with a stat CT scan per Dr. Berline Lopes to assess the area in the upper abdomen to see if drainage is needed. Patient is agreeable with the plan. After lab work, she was escorted to admitting to check in for stat CT scan. She will be contacted with the results. Our office will also look into rescheduling an appointment with the dietician as well.  [name], NP 07/09/2021, 11:00 AM

## 2021-07-09 NOTE — Progress Notes (Signed)
Called Donnie to discuss lab work from yesterday.  Unfortunately, her CA-125 has continued to increase and is now almost 43.  This in combination with small volume ascites and concern for peritoneal thickening versus nodularity raises suspicion for recurrent cancer.  I think this may also be contributing to her issues with bowel function.  She was understandably upset by this news.  We discussed having her come in to discuss restarting systemic therapy with Dr. Alvy Bimler next Tuesday.  She is amenable to this appointment.  She is meeting with nutrition at the end of the week.  I also discussed a 2 or 3-week course of medication to help boost her appetite, sent prescription for steroid to her pharmacy.  She requested refills of oxycodone and Compazine which I also sent.  Jeral Pinch MD Gynecologic Oncology

## 2021-07-11 ENCOUNTER — Encounter: Payer: Self-pay | Admitting: Gynecologic Oncology

## 2021-07-11 ENCOUNTER — Other Ambulatory Visit: Payer: Medicaid Other

## 2021-07-11 NOTE — Progress Notes (Unsigned)
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 There is a 16 cm complex cystic mass in the pelvis containing thickened septations located near midline, abutting the superior aspect of the uterus. I suspect this mass is adnexal/ovarian in origin rather than uterine in origin. Specifically, I suspect the mass likely arises from the left ovary/adnexa and is most consistent with a neoplasm. Malignancy is certainly not excluded on this study. Recommend a pelvic ultrasound for further evaluation. 2. The rounded more solid-appearing structure in the right side of the pelvis is probably the right ovary. Recommend attention on ultrasound. 3. The small amount of fluid in the pelvis is likely reactive to the complex cystic mass likely rising from the left ovary/adnexa. 4. No cause for blood in vomit or black stools identified. No convincing evidence of a perforated or inflamed gastric or duodenal ulcer. 5. Hepatic steatosis.   07/02/2019 Imaging   MRI pelvis 1. 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 There are 2 enhancing nodules within the central upper abdomen raising the possibility of peritoneal carcinomatosis. Additionally, there is a small amount fluid within the pelvis. Malignant ascites not excluded. 2. Fibroid uterus. 3. Hepatic steatosis.   07/02/2019 Imaging   US pelvis 1. There is a large mass in the pelvis also seen on CT imaging. A separate left ovary is not visualized. 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 The mass is suspicious for malignancy. Recommend  gynecologic consultation. 2. The right ovary demonstrates an irregular ill-defined hypoechoic hypervascular region centrally. The findings are nonspecific in the right ovary. However, given the apparent left ovarian neoplasm suspicious for malignancy, recommend an MRI to further assess the right ovary. 3. Uterine fibroid measuring 3.6 cm.     07/21/2019 Pathology Results   FINAL MICROSCOPIC DIAGNOSIS: A. 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 - See oncology table. B. OMENTUM, RESECTION: - Omental lymph node with metastatic carcinoma (1/1). - Omental adipose tissue with foci of inflammation and reactive mesothelial changes. C. FALLOPIAN TUBE, LEFT, RESECTION: - Fallopian tube with focal, mild inflammation and fibrosis. - No tumor identified. D. UTERUS, CERVIX, RIGHT TUBE AND OVARY: - Cervix Nabothian cyst and squamous metaplasia. - Endometrium Proliferative. No hyperplasia or carcinoma. - Myometrium Leiomyomata. No malignancy. -Right ovary Poorly differentiated carcinoma, 4.8 cm. See oncology table and comment. -Right Fallopian tube Benign paratubal cyst. No endometriosis or malignancy. ONCOLOGY TABLE: OVARY or FALLOPIAN TUBE or PRIMARY PERITONEUM: Procedure: Bilateral f-oophorectomy, hysterectomy and omentectomy. Specimen Integrity: Intact Tumor Site: Left ovary and right ovary. See comment. Ovarian Surface Involvement: Not identified. Fallopian Tube Surface Involvement: Not identified. Tumor Size: Left ovary: 18.6 x 16.0 x 7.2 cm. Right ovary: 4.8 x 3.2 x 3.2 cm. Histologic Type: Clear cell carcinoma. See comment. Histologic Grade: High-grade. Other Tissue/ Organ Involvement: Omental lymph node with metastatic carcinoma. Peritoneal/Ascitic Fluid: Pending. Treatment Effect: No known presurgical therapy. Pathologic Stage Classification (pTNM, AJCC 8th Edition): pT3a, pN1a. Representative Tumor Block: A2, A3, A4, A5 A6, A7 and  A8. Comment(s): The left ovarian tumor is 18.6 cm in greatest  dimension and has features of clear cell carcinoma. The right ovarian tumor is 4.8 cm in greatest dimension and is poorly differentiated with focal features consistent with clear cell carcinoma. The pattern of involvement suggests that the right ovarian tumor is likely metastatic from the larger left ovarian clear cell carcinoma. Sections of the omentum show a lymph node with metastatic carcinoma. Sections of the remainder of the omentum do not show involvement by carcinoma.   07/21/2019 Surgery   Procedure(s) Performed: Exploratory laparotomy with radical tumor debulking including total hysterectomy, bilateral salpingo-oophorectomy, infra-gastric omentectomy, and washings.   Surgeon: Jeral Pinch, MD    Operative Findings: On EUA, mobile uterus and ovarian mass, situated out of the pelvis, spanning 6-8 cm above the umbilicus.  On intra-abdominal entry, inflamed omentum adherent to much of the 16 cm left ovarian mass, adhesions easily lysed bluntly.  No nodularity of the ovarian mass however on delivery of the mass through the midline incision, there was Intra-Op rupture of one of the smaller cystic components with drainage of clear brown-tinged fluid.  Grossly normal-appearing left fallopian tube as well as right tube.  Right ovary mildly enlarged measuring approximately 4 cm and firm/nodular, suspicious for tumor involvement.  Uterus 8-10 cm with 4 cm anterior mid body fibroid.  Some very small volume miliary disease along right pelvic sidewall, removed with uterine specimen.  Evidence of small diverticular disease.  Small bowel normal appearing although multiple loops of bowel adherent to each other, especially by mesentery, and an inflammatory manner.  An approximately 2 x 2 centimeter nodule suspicious for tumor implant was found in the omentum just inferior to the greater curvature of the stomach.  Additional small (less than 5 mm)  implants noted on the stomach and posterior aspect of the left lobe of the liver.  Otherwise the liver surface and diaphragm were smooth.   08/03/2019 Cancer Staging   Staging form: Ovary, Fallopian Tube, and Primary Peritoneal Carcinoma, AJCC 8th Edition - Pathologic: Stage IIIA2 (pT3a, pN1, cM0) - Signed by Heath Lark, MD on 08/03/2019    08/12/2019 Procedure   Placement of single lumen port a cath via right internal jugular vein. The catheter tip lies at the cavo-atrial junction. A power injectable port a cath was placed and is ready for immediate use.   08/22/2019 -  Chemotherapy   The patient had carboplatin and taxol for chemotherapy treatment.     09/23/2019 Genetic Testing   Negative genetic testing:  No pathogenic variants detected on the [facility] TumorNext-HRD+CancerNext paired germline and somatic genetic test. The report date is 09/23/2019.  The TumorNext-HRD+CancerNext test offered by [facility] includes paired germline and tumor analyses of 11 genes associated with homologous recombination repair (ATM, BARD1, BRIP1, CHEK2, MRE11A, NBN, PALB2, RAD51C, RAD51D, BRCA1, BRCA2) plus germline analyses of 26 additional genes associated with hereditary cancer (APC, AXIN2, BMPR1A, CDH1, CDK4, CDKN2A, DICER1, HOXB13, EPCAM, GREM1, MLH1, MSH2, MSH3, MSH6, MUTYH, NF1, NTHL1, PMS2, POLD1, POLE, PTEN, RECQL, SMAD4, SMARCA4, STK11, and TP53).   11/14/2019 Tumor Marker   Patient's tumor was tested for the following markers: CA-125 Results of the tumor marker test revealed 10.2.   12/05/2019 Tumor Marker   Patient's tumor was tested for the following markers: CA-125 Results of the tumor marker test revealed 9.9   01/05/2020 Imaging   No specific findings of active malignancy. Interval resection of the pelvic mass. Subtle nodularity along the right side of the vaginal cuff merits surveillance.     01/05/2020 Tumor Marker   Patient's  tumor was tested for the following markers: CA-125 Results of the tumor  marker test revealed 9.7   02/17/2020 Tumor Marker   Patient's tumor was tested for the following markers: CA-125 Results of the tumor marker test revealed 9.6   04/05/2020 Imaging   1. There is some minimal stranding along the mesentery and omentum without overt omental caking or well-defined nodularity. Faint bandlike density along the anterior peritoneal reflection in the pelvis, slightly more notable than on prior. Overall the appearance is not considered specific for recurrence but given the slight increase in prominence from 01/05/2020, would encourage careful surveillance by tumor markers and imaging. 2. Mild lower lumbar degenerative facet arthropathy.   04/05/2020 Tumor Marker   Patient's tumor was tested for the following markers: CA-125 Results of the tumor marker test revealed 11   05/29/2020 Tumor Marker   Patient's tumor was tested for the following markers: CA-125. Results of the tumor marker test revealed 17.   07/04/2020 Tumor Marker   Patient's tumor was tested for the following markers: CA-125. Results of the tumor marker test revealed 20.1   07/16/2020 Imaging   Stable mild stranding throughout mesenteric and omental fat, without evidence of discrete masses or ascites. No evidence of new or progressive disease.   08/29/2020 Tumor Marker   Patient's tumor was tested for the following markers: CA-125 Results of the tumor marker test revealed 32.7.   09/07/2020 Imaging   1. Interval progression of peritoneal disease within the abdomen and pelvis. Tumor predominantly involves the serosal surface of the large and small bowel loops. No signs of bowel obstruction identified at this time. 2. No evidence for metastatic disease to the chest.   09/19/2020 Imaging   1. No acute findings identified within the abdomen or pelvis. No evidence for bowel obstruction. 2. Similar appearance of soft tissue thickening along the peritoneal reflections within the abdomen and pelvis compatible  with known peritoneal disease. Recently characterized increased soft tissue along the surface of the jejunal bowel loops and tethering of pelvic small bowel loops is difficult to quantify (and compared with previous exam) due to lack of IV contrast material.     09/20/2020 -  Chemotherapy    Patient is on Treatment Plan: OVARIAN RECURRENT 3RD LINE CARBOPLATIN D1 / GEMCITABINE D1,8 (4/800) Q21D  Carboplatin was discontinued after 10/15/2020 due to allergic reaction      10/01/2020 Tumor Marker   Patient's tumor was tested for the following markers: CA-125. Results of the tumor marker test revealed 37.2.   10/15/2020 Tumor Marker   Patient's tumor was tested for the following markers: CA-125. Results of the tumor marker test revealed 48   10/22/2020 Tumor Marker   Patient's tumor was tested for the following markers: CA-125 Results of the tumor marker test revealed 28.5   11/05/2020 Tumor Marker   Patient's tumor was tested for the following markers: CA-125 Results of the tumor marker test revealed 18.9   12/17/2020 Imaging   1. No significant change in soft tissue thickening and nodularity in the low pelvis, with minimal thickening of the peritoneum throughout the abdomen. 2. Similar appearance of soft tissue thickening about the mid small bowel in the central, ventral abdomen. 3. Findings are consistent with stable peritoneal metastatic disease. 4. Status post hysterectomy and cholecystectomy.   12/18/2020 Tumor Marker   Patient's tumor was tested for the following markers: CA-125 Results of the tumor marker test revealed 10.8   01/11/2021 Imaging   Findings concerning for small bowel obstruction.  01/11/2021 Tumor Marker   Patient's tumor was tested for the following markers: CA-125. Results of the tumor marker test revealed 10.4.   01/11/2021 Imaging   CT abdomen and pelvis Mildly prominent proximal loops of small bowel although no true transition zone is seen in the distal  small bowel appears within normal limits. These changes could represent early partial small bowel obstruction. The overall appearance however is similar to that seen on prior CT from May of 2022.   Mild peritoneal thickening similar to that seen on the prior exam.   Fatty liver.   No other focal abnormality is noted.     Interval History: ***  Past Medical/Surgical History: Past Medical History:  Diagnosis Date   Anemia due to antineoplastic chemotherapy    Arthritis    Asthma due to seasonal allergies    01-21-2021 per pt seasonal and exercised induced, does not have inhaler, last used one few years   Chronic abdominal pain 01/2021   Chronic nausea    01-21-2021  per pt intermittant nausea and when has abd pain most of time will have vomiting   Family history of breast cancer    Family history of colon cancer    Family history of ovarian cancer    Family history of prostate cancer    History of 2019 novel coronavirus disease (COVID-19) 09/08/2020   positive results in epic, per pt mild symptoms that resolved   History of Helicobacter pylori infection 07/2016   E[facility] w/ bx's by [facility],  chronic h.pylori w/ gastritis,  treated  (no ulcer)   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;  progression w/ abdominal carcinomatosis   On total parenteral nutrition (TPN) 02/02/2021   infused thru [place] in place    Steroid-induced diabetes ([place])    followed by dr Alvy Bimler    Past Surgical History:  Procedure Laterality Date   CHOLECYSTECTOMY N/A 02/16/2015   Procedure: LAPAROSCOPIC CHOLECYSTECTOMY WITH INTRAOPERATIVE CHOLANGIOGRAM;  Surgeon: Johnathan Hausen, MD;  Location: [facility];  Service: General;  Laterality: N/A;   INCISION AND DRAINAGE ABSCESS N/A 02/02/2021   Procedure: INCISION AND DRAINAGE ABSCESS;  Surgeon: Lahoma Crocker, MD;   Location: [facility];  Service: Gynecology;  Laterality: N/A;   IR IMAGING GUIDED PORT INSERTION  08/12/2019   IR REMOVAL TUN ACCESS W/ PORT W/O FL MOD SED  03/18/2021   LAPAROSCOPY N/A 01/23/2021   Procedure: LAPAROSCOPY DIAGNOSTIC;  Surgeon: Lafonda Mosses, MD;  Location: [facility];  Service: Gynecology;  Laterality: N/A;   LAPAROTOMY N/A 01/23/2021   Procedure: LAPAROTOMY, ENTEROTOMY REPAIR OF THE BOWEL;  Surgeon: Lafonda Mosses, MD;  Location: [facility];  Service: Gynecology;  Laterality: N/A;   LAPAROTOMY N/A 05/03/2021   Procedure: incision and drainage abdominal collection and placement drainage catheter;  Surgeon: Lafonda Mosses, MD;  Location: [facility];  Service: Gynecology;  Laterality: N/A;   SALPINGOOPHORECTOMY N/A 07/21/2019   Procedure: EXPLORATORY LAPAROTOMY, TOTAL ABDOMINAL HYSTERECTOMY, BILATERAL SALPINGO OOPHORECTOMY, OMENTECTOMY;  Surgeon: Lafonda Mosses, MD;  Location: [facility];  Service: Gynecology;  Laterality: N/A;   UPPER GASTROINTESTINAL ENDOSCOPY  08/01/2016   by dr Corinda Gubler in [place] N/A 03/11/2021   Procedure: OPENING AND EXPLORATION OF ABDOMINAL EXCISION;  Surgeon: Lafonda Mosses, MD;  Location[facility];  Service: Gynecology;  Laterality:  N/A;    Family History  Problem Relation Age of Onset   Colon cancer Father 53   Prostate cancer Father 64   Colon cancer Paternal Aunt        dx. early 64s   Ovarian cancer Paternal Grandmother 48   Breast cancer Cousin    Endometriosis Mother    Cancer Paternal Uncle        unknown type, dx. early 65s   Cancer Paternal Uncle 10       unknown type   Esophageal cancer Neg Hx     Social History   Socioeconomic History   Marital status: Married    Spouse name: Herbie [place]   Number of children: 2   Years of education: Not on file   Highest education level: Not on file  Occupational History   Not on file  Tobacco Use   Smoking status:  Never   Smokeless tobacco: Never  Vaping Use   Vaping Use: Never used  Substance and Sexual Activity   Alcohol use: Yes    Comment: Social drinker   Drug use: Never   Sexual activity: Yes    Birth control/protection: Surgical  Other Topics Concern   Not on file  Social History Narrative   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  Newly married as of October 2020.   2 boys, age 28 and 18   Social Determinants of Health   Emergency planning/management officer Strain: Not on file  Food Insecurity: Not on file  Transportation Needs: Not on file  Physical Activity: Not on file  Stress: Not on file  Social Connections: Not on file    Current Medications:  Current Outpatient Medications:    acetaminophen (TYLENOL) 325 MG tablet, Take 650 mg by mouth every 6 (six) hours as needed for mild pain, fever or headache., Disp: , Rfl:    acetaminophen (TYLENOL) 650 MG suppository, Place 1 suppository (650 mg total) rectally every 6 (six) hours as needed for moderate pain., Disp: 15 suppository, Rfl: 2   dexamethasone (DECADRON) 4 MG tablet, Take 1/2 tablet (2 mg total) by mouth 2 (two) times daily., Disp: 28 tablet, Rfl: 0   Docusate Sodium (COLACE PO), Take by mouth as needed., Disp: , Rfl:    famotidine (PEPCID) 20 MG tablet, Take 1 tablet (20 mg total) by mouth daily., Disp: 30 tablet, Rfl: 3   lidocaine-prilocaine (EMLA) cream, APPLY TO THE AFFECTED AREA AS DIRECTED TO ACCESS PORT, Disp: 30 g, Rfl: 11   LORazepam (ATIVAN) 0.5 MG tablet, Take 1 tablet by mouth every 8 hours as needed for anxiety/insomnia. Do not take and drive., Disp: 10 tablet, Rfl: 0   metoCLOPramide (REGLAN) 10 MG tablet, Take 1 tablet (10 mg total) by mouth every 8 (eight) hours as needed for nausea., Disp: 30 tablet, Rfl: 1   omeprazole (PRILOSEC) 40 MG capsule, Take 1 capsule (40 mg total) by mouth daily., Disp: 30 capsule, Rfl: 3   ondansetron (ZOFRAN ODT) 4 MG disintegrating tablet, Take 1 tablet (4 mg total) by mouth every 4  (four) hours as needed for up to 20 doses for nausea or vomiting., Disp: 20 tablet, Rfl: 0   oxyCODONE (OXY IR/ROXICODONE) 5 MG immediate release tablet, Take 1 tablet (5 mg total) by mouth every 4 (four) hours as needed for severe pain., Disp: 30 tablet, Rfl: 0   Polyethylene Glycol 3350 (MIRALAX PO), Take by mouth., Disp: , Rfl:    prochlorperazine (COMPAZINE) 10 MG tablet, Take 1  tablet (10 mg total) by mouth every 8 (eight) hours as needed for nausea or vomiting., Disp: 30 tablet, Rfl: 0   promethazine (PHENERGAN) 12.5 MG suppository, Place 1 suppository (12.5 mg total) rectally every 8 (eight) hours as needed for up to 30 doses for nausea or vomiting., Disp: 30 each, Rfl: 0   Accu-Chek Softclix Lancets lancets, Use as instructed (Patient not taking: Reported on 06/11/2021), Disp: 100 each, Rfl: 12   glucose blood test strip, Use as instructed (Patient not taking: Reported on 06/11/2021), Disp: 100 each, Rfl: 12   NOVOLOG 100 UNIT/ML injection, Inject into the skin. (Patient not taking: Reported on 06/11/2021), Disp: , Rfl:   Review of Systems: Denies appetite changes, fevers, chills, fatigue, unexplained weight changes. Denies hearing loss, neck lumps or masses, mouth sores, ringing in ears or voice changes. Denies cough or wheezing.  Denies shortness of breath. Denies chest pain or palpitations. Denies leg swelling. Denies abdominal distention, pain, blood in stools, constipation, diarrhea, nausea, vomiting, or early satiety. Denies pain with intercourse, dysuria, frequency, hematuria or incontinence. Denies hot flashes, pelvic pain, vaginal bleeding or vaginal discharge.   Denies joint pain, back pain or muscle pain/cramps. Denies itching, rash, or wounds. Denies dizziness, headaches, numbness or seizures. Denies swollen lymph nodes or glands, denies easy bruising or bleeding. Denies anxiety, depression, confusion, or decreased concentration.  Physical Exam: LMP 07/05/2019  General:  ***Alert, oriented, no acute distress. HEENT: ***Posterior oropharynx clear, sclera anicteric. Chest: ***Clear to auscultation bilaterally.  ***Port site clean. Cardiovascular: ***Regular rate and rhythm, no murmurs. Abdomen: ***Obese, soft, nontender.  Normoactive bowel sounds.  No masses or hepatosplenomegaly appreciated.  ***Well-healed scar. Extremities: ***Grossly normal range of motion.  Warm, well perfused.  No edema bilaterally. Skin: ***No rashes or lesions noted. Lymphatics: ***No cervical, supraclavicular, or inguinal adenopathy. GU: Normal appearing external genitalia without erythema, excoriation, or lesions.  Speculum exam reveals ***.  Bimanual exam reveals ***.  ***Rectovaginal exam  confirms ___.  Laboratory & Radiologic Studies: Ct A/P on 12/5: 1. No significant change in appearance of the soft tissue nodularity in the anterior abdominal wall in the region of the prior fistulous tract, no new walled off fluid collection. 2. Similar appearance of the mixed density nodular area in the anterior abdomen, possibly reflecting loops of bowel, scarring and ascites however this area is difficult to delineate given the lack of oral contrast material. No intra abdominopelvic walled off fluid collection. 3. Increased small volume of abdominopelvic free fluid with similar peritoneal thickening. 4. Similar wall thickening of the prominent gas and fluid-filled loops of small bowel in the right hemiabdomen without evidence of high-grade obstruction.  Assessment & Plan: Evella Kasal is a 45 y.o. woman with Stage *** who presents for ***.  ***  *** minutes of total time was spent for this patient encounter, including preparation, face-to-face counseling with the patient and coordination of care, and documentation of the encounter.  Jeral Pinch, MD  Division of Gynecologic Oncology  Department of Obstetrics and Gynecology  [facility]

## 2021-07-12 ENCOUNTER — Inpatient Hospital Stay: Payer: Medicaid Other | Admitting: Gynecologic Oncology

## 2021-07-12 ENCOUNTER — Inpatient Hospital Stay: Payer: Medicaid Other | Admitting: Dietician

## 2021-07-12 DIAGNOSIS — C563 Malignant neoplasm of bilateral ovaries: Secondary | ICD-10-CM

## 2021-07-12 NOTE — Progress Notes (Signed)
Patient did not show for nutrition appointment. This is patient's third no-show for nutrition counseling.

## 2021-07-16 ENCOUNTER — Telehealth: Payer: Self-pay | Admitting: Oncology

## 2021-07-16 ENCOUNTER — Inpatient Hospital Stay (HOSPITAL_BASED_OUTPATIENT_CLINIC_OR_DEPARTMENT_OTHER): Payer: Medicaid Other | Admitting: Hematology and Oncology

## 2021-07-16 ENCOUNTER — Other Ambulatory Visit: Payer: Self-pay

## 2021-07-16 VITALS — BP 126/83 | HR 64 | Temp 98.8°F | Resp 18 | Ht 62.0 in | Wt 160.2 lb

## 2021-07-16 DIAGNOSIS — C786 Secondary malignant neoplasm of retroperitoneum and peritoneum: Secondary | ICD-10-CM | POA: Diagnosis not present

## 2021-07-16 DIAGNOSIS — R1010 Upper abdominal pain, unspecified: Secondary | ICD-10-CM | POA: Diagnosis not present

## 2021-07-16 DIAGNOSIS — Z90710 Acquired absence of both cervix and uterus: Secondary | ICD-10-CM | POA: Diagnosis not present

## 2021-07-16 DIAGNOSIS — K632 Fistula of intestine: Secondary | ICD-10-CM | POA: Diagnosis not present

## 2021-07-16 DIAGNOSIS — R112 Nausea with vomiting, unspecified: Secondary | ICD-10-CM | POA: Diagnosis not present

## 2021-07-16 DIAGNOSIS — R634 Abnormal weight loss: Secondary | ICD-10-CM

## 2021-07-16 DIAGNOSIS — C562 Malignant neoplasm of left ovary: Secondary | ICD-10-CM | POA: Diagnosis not present

## 2021-07-16 DIAGNOSIS — R630 Anorexia: Secondary | ICD-10-CM | POA: Diagnosis not present

## 2021-07-16 DIAGNOSIS — R1900 Intra-abdominal and pelvic swelling, mass and lump, unspecified site: Secondary | ICD-10-CM | POA: Diagnosis not present

## 2021-07-16 DIAGNOSIS — C775 Secondary and unspecified malignant neoplasm of intrapelvic lymph nodes: Secondary | ICD-10-CM | POA: Diagnosis not present

## 2021-07-16 DIAGNOSIS — Z90722 Acquired absence of ovaries, bilateral: Secondary | ICD-10-CM | POA: Diagnosis not present

## 2021-07-16 DIAGNOSIS — C563 Malignant neoplasm of bilateral ovaries: Secondary | ICD-10-CM | POA: Diagnosis not present

## 2021-07-16 NOTE — Telephone Encounter (Signed)
Sarah Newman and reminded her of Dr. Calton Dach appointment today.  Advised her to arrive by 1:30.  Kaiya said she will be here and is also bringing her mom and dad to the appointment.

## 2021-07-17 ENCOUNTER — Other Ambulatory Visit (HOSPITAL_COMMUNITY): Payer: Self-pay

## 2021-07-17 ENCOUNTER — Other Ambulatory Visit: Payer: Self-pay | Admitting: Hematology and Oncology

## 2021-07-17 ENCOUNTER — Encounter: Payer: Self-pay | Admitting: Hematology and Oncology

## 2021-07-17 ENCOUNTER — Other Ambulatory Visit: Payer: Self-pay | Admitting: Gynecologic Oncology

## 2021-07-17 DIAGNOSIS — R634 Abnormal weight loss: Secondary | ICD-10-CM | POA: Insufficient documentation

## 2021-07-17 DIAGNOSIS — K632 Fistula of intestine: Secondary | ICD-10-CM

## 2021-07-17 DIAGNOSIS — R110 Nausea: Secondary | ICD-10-CM

## 2021-07-17 DIAGNOSIS — C562 Malignant neoplasm of left ovary: Secondary | ICD-10-CM

## 2021-07-17 MED ORDER — ONDANSETRON 4 MG PO TBDP: 4.0000 mg | ORAL_TABLET | ORAL | 0 refills | Status: AC | PRN

## 2021-07-17 MED ORDER — ONDANSETRON HCL 8 MG PO TABS
8.0000 mg | ORAL_TABLET | Freq: Three times a day (TID) | ORAL | 1 refills | Status: AC | PRN
  Filled 2021-07-17: qty 30, 10d supply, fill #0

## 2021-07-17 NOTE — Assessment & Plan Note (Signed)
I have reviewed multiple imaging studies with the patient and family The elevated tumor marker is nonspecific, especially with presence of ascitic fluid/fluid collection in her abdomen Her chronic nausea/intermittent abdominal pain is also nonspecific At present time, I do not see conclusive evidence of cancer recurrence She is not a candidate to pursue additional biopsy due to history of fistula Currently, the patient is quite weak and deconditioned due to inability to take in adequate oral intake by mouth She has missed several appointment with dietitian I recommend the patient to continue frequent small meals, document oral intake and to take Reglan before each meal to help with the symptoms I will see her back sometime next month for further follow-up She does not need to worry about restarting treatment right now

## 2021-07-17 NOTE — Assessment & Plan Note (Signed)
She has chronic nausea vomiting since surgery I recommend the patient to take Reglan before meals

## 2021-07-17 NOTE — Progress Notes (Signed)
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[place]) I have reviewed multiple imaging studies with the patient and family The elevated tumor marker is nonspecific, especially with presence of ascitic fluid/fluid collection in her abdomen Her chronic nausea/intermittent abdominal pain is also nonspecific At present time, I do not see conclusive evidence of cancer recurrence She is not a candidate to pursue additional biopsy due to history of fistula Currently, the patient is quite weak and deconditioned due to inability to take in adequate oral intake by mouth She has missed several appointment with dietitian I recommend the patient to continue frequent small meals, document oral intake and to take Reglan before each meal to help with the symptoms I will see her back sometime next month for further follow-up She does not need to worry about restarting treatment right now  Nausea and vomiting She has chronic nausea vomiting since surgery I recommend the patient to take Reglan before meals  Weight loss, non-intentional She has progressive weight loss due to difficulties maintaining oral intake since discontinuation of TPN I gave her instruction to document her oral intake We will try to get her rescheduled to see dietitian There is no indication for her to resume TPN  Orders Placed This Encounter  Procedures   CBC with Differential/Platelet    Standing Status:   Standing    Number of Occurrences:   22    Standing Expiration Date:   07/17/2022   Comprehensive metabolic panel    Standing Status:   Standing    Number of Occurrences:   33    Standing Expiration Date:   07/17/2022   CA 125    Standing Status:   Standing    Number of Occurrences:   11    Standing Expiration Date:   07/17/2022    All questions were answered. The patient knows to call  the clinic with any problems, questions or concerns. The total time spent in the appointment was 40 minutes encounter with patients including review of chart and various tests results, discussions about plan of care and coordination of care plan   Heath Lark, MD 07/17/2021 3:32 PM  INTERVAL HISTORY: Please see below for problem oriented charting. she returns for follow-up to review abnormal tumor marker and imaging study She is here accompanied by her husband and her parents Since last time I saw her 6 months ago, she underwent surgery complicated by fistula and infection Her fistula finally healed recently and TPN was discontinued The patient struggle to eat and has progressive weight loss She estimated approximately 800 cal of oral intake She complained of nausea after eating Denies constipation She has missed several appointment with dietitian No recent fever or chills  REVIEW OF SYSTEMS:   Constitutional: Denies fevers, chills or abnormal weight loss Eyes: Denies blurriness of vision Ears, nose, mouth, throat, and face: Denies mucositis or sore throat Respiratory: Denies cough, dyspnea or wheezes Cardiovascular: Denies palpitation, chest discomfort or lower extremity swelling Skin: Denies abnormal skin rashes Lymphatics: Denies new lymphadenopathy or easy bruising Neurological:Denies numbness, tingling or new weaknesses Behavioral/Psych: Mood is stable, no new changes  All other systems were reviewed with the patient and are negative.  I have reviewed the past medical history, past surgical history, social history and family history with the patient and they are unchanged from previous note.  ALLERGIES:  is allergic  to carboplatin.  MEDICATIONS:  Current Outpatient Medications  Medication Sig Dispense Refill   Accu-Chek Softclix Lancets lancets Use as instructed (Patient not taking: Reported on 06/11/2021) 100 each 12   acetaminophen (TYLENOL) 325 MG tablet Take 650 mg by  mouth every 6 (six) hours as needed for mild pain, fever or headache.     acetaminophen (TYLENOL) 650 MG suppository Place 1 suppository (650 mg total) rectally every 6 (six) hours as needed for moderate pain. 15 suppository 2   dexamethasone (DECADRON) 4 MG tablet Take 1/2 tablet (2 mg total) by mouth 2 (two) times daily. 28 tablet 0   Docusate Sodium (COLACE PO) Take by mouth as needed.     famotidine (PEPCID) 20 MG tablet Take 1 tablet (20 mg total) by mouth daily. 30 tablet 3   glucose blood test strip Use as instructed (Patient not taking: Reported on 06/11/2021) 100 each 12   lidocaine-prilocaine (EMLA) cream APPLY TO THE AFFECTED AREA AS DIRECTED TO ACCESS PORT 30 g 11   LORazepam (ATIVAN) 0.5 MG tablet Take 1 tablet by mouth every 8 hours as needed for anxiety/insomnia. Do not take and drive. 10 tablet 0   metoCLOPramide (REGLAN) 10 MG tablet Take 1 tablet (10 mg total) by mouth every 8 (eight) hours as needed for nausea. 30 tablet 1   NOVOLOG 100 UNIT/ML injection Inject into the skin. (Patient not taking: Reported on 06/11/2021)     omeprazole (PRILOSEC) 40 MG capsule Take 1 capsule (40 mg total) by mouth daily. 30 capsule 3   ondansetron (ZOFRAN ODT) 4 MG disintegrating tablet Take 1 tablet (4 mg total) by mouth every 4 (four) hours as needed for up to 20 doses for nausea or vomiting. 20 tablet 0   oxyCODONE (OXY IR/ROXICODONE) 5 MG immediate release tablet Take 1 tablet (5 mg total) by mouth every 4 (four) hours as needed for severe pain. 30 tablet 0   Polyethylene Glycol 3350 (MIRALAX PO) Take by mouth.     prochlorperazine (COMPAZINE) 10 MG tablet Take 1 tablet (10 mg total) by mouth every 8 (eight) hours as needed for nausea or vomiting. 30 tablet 0   promethazine (PHENERGAN) 12.5 MG suppository Place 1 suppository (12.5 mg total) rectally every 8 (eight) hours as needed for up to 30 doses for nausea or vomiting. 30 each 0   No current facility-administered medications for this visit.     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 There is a 16 cm complex cystic mass in the pelvis containing thickened septations located near midline, abutting the superior aspect of the uterus. I suspect this mass is adnexal/ovarian in origin rather than uterine in origin. Specifically, I suspect the mass likely arises from the left ovary/adnexa and is most consistent with a neoplasm. Malignancy is certainly not excluded on this study. Recommend a pelvic ultrasound for further evaluation. 2. The rounded more solid-appearing structure in the right side of the pelvis is probably the right ovary. Recommend attention on ultrasound. 3. The small amount of fluid in the pelvis is likely reactive to the complex cystic mass likely rising from the left ovary/adnexa. 4. No cause for blood in vomit or black stools identified. No convincing evidence of a perforated or inflamed gastric or duodenal ulcer. 5. Hepatic steatosis.   07/02/2019 Imaging   MRI pelvis 1. There is  a large (15 cm) solid and cystic mass which appears to be arising from the left ovary concerning for cystic ovarian neoplasm. There are 2 enhancing nodules within the central upper abdomen raising the possibility of peritoneal carcinomatosis. Additionally, there is a small amount fluid within the pelvis. Malignant ascites not excluded. 2. Fibroid uterus. 3. Hepatic steatosis.   07/02/2019 Imaging   US pelvis 1. There is a large mass in the pelvis also seen on CT imaging. A separate left ovary is not visualized. 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 The mass is suspicious for malignancy. Recommend gynecologic consultation. 2. The right ovary demonstrates an irregular ill-defined hypoechoic hypervascular region centrally. The findings are  nonspecific in the right ovary. However, given the apparent left ovarian neoplasm suspicious for malignancy, recommend an MRI to further assess the right ovary. 3. Uterine fibroid measuring 3.6 cm.     07/21/2019 Pathology Results   FINAL MICROSCOPIC DIAGNOSIS: A. 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 - See oncology table. B. OMENTUM, RESECTION: - Omental lymph node with metastatic carcinoma (1/1). - Omental adipose tissue with foci of inflammation and reactive mesothelial changes. C. FALLOPIAN TUBE, LEFT, RESECTION: - Fallopian tube with focal, mild inflammation and fibrosis. - No tumor identified. D. UTERUS, CERVIX, RIGHT TUBE AND OVARY: - Cervix Nabothian cyst and squamous metaplasia. - Endometrium Proliferative. No hyperplasia or carcinoma. - Myometrium Leiomyomata. No malignancy. -Right ovary Poorly differentiated carcinoma, 4.8 cm. See oncology table and comment. -Right Fallopian tube Benign paratubal cyst. No endometriosis or malignancy. ONCOLOGY TABLE: OVARY or FALLOPIAN TUBE or PRIMARY PERITONEUM: Procedure: Bilateral f-oophorectomy, hysterectomy and omentectomy. Specimen Integrity: Intact Tumor Site: Left ovary and right ovary. See comment. Ovarian Surface Involvement: Not identified. Fallopian Tube Surface Involvement: Not identified. Tumor Size: Left ovary: 18.6 x 16.0 x 7.2 cm. Right ovary: 4.8 x 3.2 x 3.2 cm. Histologic Type: Clear cell carcinoma. See comment. Histologic Grade: High-grade. Other Tissue/ Organ Involvement: Omental lymph node with metastatic carcinoma. Peritoneal/Ascitic Fluid: Pending. Treatment Effect: No known presurgical therapy. Pathologic Stage Classification (pTNM, AJCC 8th Edition): pT3a, pN1a. Representative Tumor Block: A2, A3, A4, A5 A6, A7 and A8. Comment(s): The left ovarian tumor is 18.6 cm in greatest dimension and has features of clear cell carcinoma. The right ovarian tumor is 4.8 cm  in greatest dimension and is poorly differentiated with focal features consistent with clear cell carcinoma. The pattern of involvement suggests that the right ovarian tumor is likely metastatic from the larger left ovarian clear cell carcinoma. Sections of the omentum show a lymph node with metastatic carcinoma. Sections of the remainder of the omentum do not show involvement by carcinoma.   07/21/2019 Surgery   Procedure(s) Performed: Exploratory laparotomy with radical tumor debulking including total hysterectomy, bilateral salpingo-oophorectomy, infra-gastric omentectomy, and washings.   Surgeon: Jeral Pinch, MD    Operative Findings: On EUA, mobile uterus and ovarian mass, situated out of the pelvis, spanning 6-8 cm above the umbilicus.  On intra-abdominal entry, inflamed omentum adherent to much of the 16 cm left ovarian mass, adhesions easily lysed bluntly.  No nodularity of the ovarian mass however on delivery of the mass through the midline incision, there was Intra-Op rupture of one of the smaller cystic components with drainage of clear brown-tinged fluid.  Grossly normal-appearing left fallopian tube as well as right tube.  Right ovary mildly enlarged measuring approximately 4 cm and firm/nodular, suspicious for tumor involvement.  Uterus 8-10 cm  with 4 cm anterior mid body fibroid.  Some very small volume miliary disease along right pelvic sidewall, removed with uterine specimen.  Evidence of small diverticular disease.  Small bowel normal appearing although multiple loops of bowel adherent to each other, especially by mesentery, and an inflammatory manner.  An approximately 2 x 2 centimeter nodule suspicious for tumor implant was found in the omentum just inferior to the greater curvature of the stomach.  Additional small (less than 5 mm) implants noted on the stomach and posterior aspect of the left lobe of the liver.  Otherwise the liver surface and diaphragm were smooth.   08/03/2019  Cancer Staging   Staging form: Ovary, Fallopian Tube, and Primary Peritoneal Carcinoma, AJCC 8th Edition - Pathologic: Stage IIIA2 (pT3a, pN1, cM0) - Signed by Heath Lark, MD on 08/03/2019    08/12/2019 Procedure   Placement of single lumen port a cath via right internal jugular vein. The catheter tip lies at the cavo-atrial junction. A power injectable port a cath was placed and is ready for immediate use.   08/22/2019 -  Chemotherapy   The patient had carboplatin and taxol for chemotherapy treatment.     09/23/2019 Genetic Testing   Negative genetic testing:  No pathogenic variants detected on the [facility] TumorNext-HRD+CancerNext paired germline and somatic genetic test. The report date is 09/23/2019.  The TumorNext-HRD+CancerNext test offered by [facility] includes paired germline and tumor analyses of 11 genes associated with homologous recombination repair (ATM, BARD1, BRIP1, CHEK2, MRE11A, NBN, PALB2, RAD51C, RAD51D, BRCA1, BRCA2) plus germline analyses of 26 additional genes associated with hereditary cancer (APC, AXIN2, BMPR1A, CDH1, CDK4, CDKN2A, DICER1, HOXB13, EPCAM, GREM1, MLH1, MSH2, MSH3, MSH6, MUTYH, NF1, NTHL1, PMS2, POLD1, POLE, PTEN, RECQL, SMAD4, SMARCA4, STK11, and TP53).   11/14/2019 Tumor Marker   Patient's tumor was tested for the following markers: CA-125 Results of the tumor marker test revealed 10.2.   12/05/2019 Tumor Marker   Patient's tumor was tested for the following markers: CA-125 Results of the tumor marker test revealed 9.9   01/05/2020 Imaging   No specific findings of active malignancy. Interval resection of the pelvic mass. Subtle nodularity along the right side of the vaginal cuff merits surveillance.     01/05/2020 Tumor Marker   Patient's tumor was tested for the following markers: CA-125 Results of the tumor marker test revealed 9.7   02/17/2020 Tumor Marker   Patient's tumor was tested for the following markers: CA-125 Results of the tumor marker test  revealed 9.6   04/05/2020 Imaging   1. There is some minimal stranding along the mesentery and omentum without overt omental caking or well-defined nodularity. Faint bandlike density along the anterior peritoneal reflection in the pelvis, slightly more notable than on prior. Overall the appearance is not considered specific for recurrence but given the slight increase in prominence from 01/05/2020, would encourage careful surveillance by tumor markers and imaging. 2. Mild lower lumbar degenerative facet arthropathy.   04/05/2020 Tumor Marker   Patient's tumor was tested for the following markers: CA-125 Results of the tumor marker test revealed 11   05/29/2020 Tumor Marker   Patient's tumor was tested for the following markers: CA-125. Results of the tumor marker test revealed 17.   07/04/2020 Tumor Marker   Patient's tumor was tested for the following markers: CA-125. Results of the tumor marker test revealed 20.1   07/16/2020 Imaging   Stable mild stranding throughout mesenteric and omental fat, without evidence of discrete masses or ascites. No evidence  of new or progressive disease.   08/29/2020 Tumor Marker   Patient's tumor was tested for the following markers: CA-125 Results of the tumor marker test revealed 32.7.   09/07/2020 Imaging   1. Interval progression of peritoneal disease within the abdomen and pelvis. Tumor predominantly involves the serosal surface of the large and small bowel loops. No signs of bowel obstruction identified at this time. 2. No evidence for metastatic disease to the chest.   09/19/2020 Imaging   1. No acute findings identified within the abdomen or pelvis. No evidence for bowel obstruction. 2. Similar appearance of soft tissue thickening along the peritoneal reflections within the abdomen and pelvis compatible with known peritoneal disease. Recently characterized increased soft tissue along the surface of the jejunal bowel loops and tethering of pelvic small  bowel loops is difficult to quantify (and compared with previous exam) due to lack of IV contrast material.     09/20/2020 -  Chemotherapy    Patient is on Treatment Plan: OVARIAN RECURRENT 3RD LINE CARBOPLATIN D1 / GEMCITABINE D1,8 (4/800) Q21D  Carboplatin was discontinued after 10/15/2020 due to allergic reaction      10/01/2020 Tumor Marker   Patient's tumor was tested for the following markers: CA-125. Results of the tumor marker test revealed 37.2.   10/15/2020 Tumor Marker   Patient's tumor was tested for the following markers: CA-125. Results of the tumor marker test revealed 48   10/22/2020 Tumor Marker   Patient's tumor was tested for the following markers: CA-125 Results of the tumor marker test revealed 28.5   11/05/2020 Tumor Marker   Patient's tumor was tested for the following markers: CA-125 Results of the tumor marker test revealed 18.9   12/17/2020 Imaging   1. No significant change in soft tissue thickening and nodularity in the low pelvis, with minimal thickening of the peritoneum throughout the abdomen. 2. Similar appearance of soft tissue thickening about the mid small bowel in the central, ventral abdomen. 3. Findings are consistent with stable peritoneal metastatic disease. 4. Status post hysterectomy and cholecystectomy.   12/18/2020 Tumor Marker   Patient's tumor was tested for the following markers: CA-125 Results of the tumor marker test revealed 10.8   01/11/2021 Imaging   Findings concerning for small bowel obstruction.     01/11/2021 Tumor Marker   Patient's tumor was tested for the following markers: CA-125. Results of the tumor marker test revealed 10.4.   01/11/2021 Imaging   CT abdomen and pelvis Mildly prominent proximal loops of small bowel although no true transition zone is seen in the distal small bowel appears within normal limits. These changes could represent early partial small bowel obstruction. The overall appearance however is similar  to that seen on prior CT from May of 2022.   Mild peritoneal thickening similar to that seen on the prior exam.   Fatty liver.   No other focal abnormality is noted.   05/17/2021 Imaging   1. Successful drainage of previously demonstrated air-collection in the anterior abdominal wall following pigtail catheter placement. 2. No well-defined residual enterocutaneous fistula identified, although there is faintly increased density within the anterior abdominal wall deep to the catheter which could reflect enhancing granulation tissue or a residual small fistula. Correlate with output from the drain. 3. No evidence of bowel obstruction or extraluminal intra-abdominal fluid or air collections. 4. Stable mild biliary dilatation post cholecystectomy.   07/08/2021 Imaging   1. No significant change in appearance of the soft tissue nodularity in the anterior  abdominal wall in the region of the prior fistulous tract, no new walled off fluid collection. 2. Similar appearance of the mixed density nodular area in the anterior abdomen, possibly reflecting loops of bowel, scarring and ascites however this area is difficult to delineate given the lack of oral contrast material. No intra abdominopelvic walled off fluid collection. 3. Increased small volume of abdominopelvic free fluid with similar peritoneal thickening. 4. Similar wall thickening of the prominent gas and fluid-filled loops of small bowel in the right hemiabdomen without evidence of high-grade obstruction.     PHYSICAL EXAMINATION: ECOG PERFORMANCE STATUS: 1 - Symptomatic but completely ambulatory  Vitals:   07/16/21 1401  BP: 126/83  Pulse: 64  Resp: 18  Temp: 98.8 F (37.1 C)  SpO2: 98%   Filed Weights   07/16/21 1401  Weight: 160 lb 3.2 oz (72.7 kg)    GENERAL:alert, no distress and comfortable SKIN: skin color, texture, turgor are normal, no rashes or significant lesions EYES: normal, Conjunctiva are pink and non-injected,  sclera clear OROPHARYNX:no exudate, no erythema and lips, buccal mucosa, and tongue normal  NECK: supple, thyroid normal size, non-tender, without nodularity LYMPH:  no palpable lymphadenopathy in the cervical, axillary or inguinal LUNGS: clear to auscultation and percussion with normal breathing effort HEART: regular rate & rhythm and no murmurs and no lower extremity edema ABDOMEN:abdomen soft, non-tender and normal bowel sounds.  Noted well-healed surgical scar.  Palpable thickness near the midline scar consistent with prior site of fistula Musculoskeletal:no cyanosis of digits and no clubbing  NEURO: alert & oriented x 3 with fluent speech, no focal motor/sensory deficits  LABORATORY DATA:  I have reviewed the data as listed    Component Value Date/Time   NA 143 07/08/2021 1615   K 3.2 (L) 07/08/2021 1615   CL 107 07/08/2021 1615   CO2 24 07/08/2021 1615   GLUCOSE 83 07/08/2021 1615   BUN 8 07/08/2021 1615   CREATININE 0.71 07/08/2021 1615   CALCIUM 9.1 07/08/2021 1615   PROT 7.1 07/08/2021 1615   ALBUMIN 3.6 07/08/2021 1615   AST 16 07/08/2021 1615   ALT 10 07/08/2021 1615   ALKPHOS 88 07/08/2021 1615   BILITOT 0.4 07/08/2021 1615   GFRNONAA >60 07/08/2021 1615   GFRAA >60 04/05/2020 1008    No results found for: SPEP, UPEP  Lab Results  Component Value Date   WBC 5.1 07/08/2021   NEUTROABS 3.4 07/08/2021   HGB 10.1 (L) 07/08/2021   HCT 32.3 (L) 07/08/2021   MCV 83.5 07/08/2021   PLT 251 07/08/2021      Chemistry      Component Value Date/Time   NA 143 07/08/2021 1615   K 3.2 (L) 07/08/2021 1615   CL 107 07/08/2021 1615   CO2 24 07/08/2021 1615   BUN 8 07/08/2021 1615   CREATININE 0.71 07/08/2021 1615      Component Value Date/Time   CALCIUM 9.1 07/08/2021 1615   ALKPHOS 88 07/08/2021 1615   AST 16 07/08/2021 1615   ALT 10 07/08/2021 1615   BILITOT 0.4 07/08/2021 1615       RADIOGRAPHIC STUDIES: I have reviewed multiple imaging studies with the  patient and family I have personally reviewed the radiological images as listed and agreed with the findings in the report. CT Abdomen Pelvis W Contrast  Result Date: 07/08/2021 CLINICAL DATA:  Persistent postprocedural fistula with new abdominal swelling, pain and firmness around the previous fistula drainage site. EXAM: CT ABDOMEN AND PELVIS WITH  CONTRAST TECHNIQUE: Multidetector CT imaging of the abdomen and pelvis was performed using the standard protocol following bolus administration of intravenous contrast. CONTRAST:  32m OMNIPAQUE IOHEXOL 350 MG/ML SOLN COMPARISON:  Multiple priors including most recent CT May 31, 2021 FINDINGS: Lower chest: No acute abnormality. Hepatobiliary: No suspicious hepatic lesion. Gallbladder surgically absent. Similar mild dilation of the biliary tree post cholecystectomy. Pancreas: No pancreatic ductal dilation or evidence of acute inflammation. Spleen: Normal in size without focal abnormality. Adrenals/Urinary Tract: Bilateral adrenal glands are unremarkable. No hydronephrosis. No solid enhancing renal mass. Urinary bladder is unremarkable for degree of distension. Stomach/Bowel: No enteric contrast was administered. Stomach is unremarkable for degree of distension. No pathologic dilation of large or small bowel. Similar appearance of the prominent gas and fluid-filled loops of small bowel in the right hemiabdomen without discrete transition, additionally there is similar wall thickening of these loops of small bowel. There is mixed density fluid and nodularity in the anterior abdomen with multiple loops of bowel traversing this area, however delineation of these bowel loops is difficult without intraluminal contrast material. No walled off fluid collection. Vascular/Lymphatic: No abdominal aortic aneurysm. No pathologically enlarged abdominal or pelvic lymph nodes. Reproductive: Uterus is surgically absent. No suspicious adnexal mass. Other: Increased small volume of  abdominopelvic free fluid soft tissue nodularity in the anterior abdomen along the prior fistulous tract appears similar to prior. No new walled off fluid collection. Musculoskeletal: No aggressive lytic or blastic lesion of bone. No acute osseous abnormality. IMPRESSION: 1. No significant change in appearance of the soft tissue nodularity in the anterior abdominal wall in the region of the prior fistulous tract, no new walled off fluid collection. 2. Similar appearance of the mixed density nodular area in the anterior abdomen, possibly reflecting loops of bowel, scarring and ascites however this area is difficult to delineate given the lack of oral contrast material. No intra abdominopelvic walled off fluid collection. 3. Increased small volume of abdominopelvic free fluid with similar peritoneal thickening. 4. Similar wall thickening of the prominent gas and fluid-filled loops of small bowel in the right hemiabdomen without evidence of high-grade obstruction. Electronically Signed   By: JDahlia BailiffM.D.   On: 07/08/2021 18:53

## 2021-07-17 NOTE — Assessment & Plan Note (Signed)
She has progressive weight loss due to difficulties maintaining oral intake since discontinuation of TPN I gave her instruction to document her oral intake We will try to get her rescheduled to see dietitian There is no indication for her to resume TPN

## 2021-07-19 ENCOUNTER — Telehealth: Payer: Self-pay

## 2021-07-19 ENCOUNTER — Other Ambulatory Visit: Payer: Self-pay

## 2021-07-19 ENCOUNTER — Other Ambulatory Visit (HOSPITAL_COMMUNITY): Payer: Self-pay

## 2021-07-19 ENCOUNTER — Telehealth: Payer: Self-pay | Admitting: Hematology and Oncology

## 2021-07-19 DIAGNOSIS — R112 Nausea with vomiting, unspecified: Secondary | ICD-10-CM

## 2021-07-19 MED ORDER — OMEPRAZOLE 40 MG PO CPDR
40.0000 mg | DELAYED_RELEASE_CAPSULE | Freq: Every day | ORAL | 9 refills | Status: AC
  Filled 2021-07-19: qty 30, 30d supply, fill #0
  Filled 2021-10-06: qty 30, 30d supply, fill #1

## 2021-07-19 NOTE — Telephone Encounter (Signed)
Called her. She left a message that was having vomiting since Wednesday. Given appt for tomorrow for IV fluids over 1 hour at 1145, arrive 30 minutes early.  Instructed per Dr. Alvy Bimler to take the Reglan Rx as prescribed. Pick up the Zofran and Prilosec Rx at [facility] take as prescribed. She verbalized understanding and will call the office back if needed.

## 2021-07-19 NOTE — Telephone Encounter (Signed)
Scheduled per sch msg. Called, not able to leave msg mailed printout

## 2021-07-20 ENCOUNTER — Other Ambulatory Visit: Payer: Self-pay

## 2021-07-20 ENCOUNTER — Inpatient Hospital Stay: Payer: Medicaid Other

## 2021-07-20 DIAGNOSIS — C563 Malignant neoplasm of bilateral ovaries: Secondary | ICD-10-CM | POA: Diagnosis not present

## 2021-07-20 DIAGNOSIS — R112 Nausea with vomiting, unspecified: Secondary | ICD-10-CM

## 2021-07-20 MED ORDER — SODIUM CHLORIDE 0.9 % IV SOLN: INTRAVENOUS | Status: AC

## 2021-07-20 NOTE — Progress Notes (Signed)
Pt tolerated iv fluids well. Advised to call facility with concerns. Pt verbalized understanding

## 2021-07-26 ENCOUNTER — Telehealth: Payer: Self-pay | Admitting: Gynecologic Oncology

## 2021-07-26 ENCOUNTER — Other Ambulatory Visit (HOSPITAL_COMMUNITY): Payer: Self-pay

## 2021-07-26 ENCOUNTER — Other Ambulatory Visit: Payer: Self-pay | Admitting: Gynecologic Oncology

## 2021-07-26 DIAGNOSIS — R1033 Periumbilical pain: Secondary | ICD-10-CM

## 2021-07-26 MED ORDER — OXYCODONE HCL 5 MG PO TABS
5.0000 mg | ORAL_TABLET | ORAL | 0 refills | Status: DC | PRN
  Filled 2021-07-26: qty 30, 5d supply, fill #0

## 2021-07-26 NOTE — Telephone Encounter (Signed)
Called the patient to discuss refill for oxycodone.  She is still struggling with nausea as well as pain when she eats due to cramping as the food moves through her matted loops of small bowel along her anterior abdominal wall.  She has been taking the Reglan once a day and finds it helpful but continues to have symptoms.  Discussed increasing to twice a day to see if this helps with how things are moving through her bowels.  She will update me early next week.  Jeral Pinch MD Gynecologic Oncology

## 2021-07-31 ENCOUNTER — Encounter: Payer: Self-pay | Admitting: Hematology and Oncology

## 2021-08-01 ENCOUNTER — Inpatient Hospital Stay (HOSPITAL_COMMUNITY)
Admission: EM | Admit: 2021-08-01 | Discharge: 2021-08-07 | DRG: 357 | Disposition: A | Discharge status: Home / Self Care | Payer: Medicaid Other | Attending: Family Medicine | Admitting: Family Medicine

## 2021-08-01 ENCOUNTER — Other Ambulatory Visit: Payer: Self-pay

## 2021-08-01 ENCOUNTER — Encounter (HOSPITAL_COMMUNITY): Payer: Self-pay

## 2021-08-01 ENCOUNTER — Emergency Department (HOSPITAL_COMMUNITY): Payer: Medicaid Other

## 2021-08-01 ENCOUNTER — Telehealth: Payer: Self-pay

## 2021-08-01 DIAGNOSIS — Z8041 Family history of malignant neoplasm of ovary: Secondary | ICD-10-CM

## 2021-08-01 DIAGNOSIS — Z4659 Encounter for fitting and adjustment of other gastrointestinal appliance and device: Secondary | ICD-10-CM

## 2021-08-01 DIAGNOSIS — K632 Fistula of intestine: Secondary | ICD-10-CM | POA: Diagnosis present

## 2021-08-01 DIAGNOSIS — K56609 Unspecified intestinal obstruction, unspecified as to partial versus complete obstruction: Secondary | ICD-10-CM | POA: Diagnosis present

## 2021-08-01 DIAGNOSIS — L02211 Cutaneous abscess of abdominal wall: Secondary | ICD-10-CM | POA: Diagnosis present

## 2021-08-01 DIAGNOSIS — Z803 Family history of malignant neoplasm of breast: Secondary | ICD-10-CM

## 2021-08-01 DIAGNOSIS — Z888 Allergy status to other drugs, medicaments and biological substances status: Secondary | ICD-10-CM

## 2021-08-01 DIAGNOSIS — B964 Proteus (mirabilis) (morganii) as the cause of diseases classified elsewhere: Secondary | ICD-10-CM | POA: Diagnosis present

## 2021-08-01 DIAGNOSIS — C562 Malignant neoplasm of left ovary: Secondary | ICD-10-CM

## 2021-08-01 DIAGNOSIS — E440 Moderate protein-calorie malnutrition: Secondary | ICD-10-CM | POA: Insufficient documentation

## 2021-08-01 DIAGNOSIS — M1990 Unspecified osteoarthritis, unspecified site: Secondary | ICD-10-CM | POA: Diagnosis present

## 2021-08-01 DIAGNOSIS — L0291 Cutaneous abscess, unspecified: Secondary | ICD-10-CM

## 2021-08-01 DIAGNOSIS — C786 Secondary malignant neoplasm of retroperitoneum and peritoneum: Principal | ICD-10-CM | POA: Diagnosis present

## 2021-08-01 DIAGNOSIS — D649 Anemia, unspecified: Secondary | ICD-10-CM | POA: Diagnosis present

## 2021-08-01 DIAGNOSIS — Z8616 Personal history of COVID-19: Secondary | ICD-10-CM

## 2021-08-01 DIAGNOSIS — G2581 Restless legs syndrome: Secondary | ICD-10-CM | POA: Diagnosis present

## 2021-08-01 DIAGNOSIS — Z8543 Personal history of malignant neoplasm of ovary: Secondary | ICD-10-CM

## 2021-08-01 DIAGNOSIS — K219 Gastro-esophageal reflux disease without esophagitis: Secondary | ICD-10-CM | POA: Diagnosis present

## 2021-08-01 DIAGNOSIS — B961 Klebsiella pneumoniae [K. pneumoniae] as the cause of diseases classified elsewhere: Secondary | ICD-10-CM | POA: Diagnosis present

## 2021-08-01 DIAGNOSIS — C772 Secondary and unspecified malignant neoplasm of intra-abdominal lymph nodes: Secondary | ICD-10-CM | POA: Diagnosis present

## 2021-08-01 DIAGNOSIS — E871 Hypo-osmolality and hyponatremia: Secondary | ICD-10-CM | POA: Diagnosis present

## 2021-08-01 DIAGNOSIS — Z0189 Encounter for other specified special examinations: Secondary | ICD-10-CM

## 2021-08-01 DIAGNOSIS — Z8042 Family history of malignant neoplasm of prostate: Secondary | ICD-10-CM

## 2021-08-01 DIAGNOSIS — R222 Localized swelling, mass and lump, trunk: Secondary | ICD-10-CM

## 2021-08-01 DIAGNOSIS — C7961 Secondary malignant neoplasm of right ovary: Secondary | ICD-10-CM | POA: Diagnosis present

## 2021-08-01 DIAGNOSIS — R188 Other ascites: Secondary | ICD-10-CM | POA: Diagnosis present

## 2021-08-01 DIAGNOSIS — E876 Hypokalemia: Secondary | ICD-10-CM | POA: Diagnosis present

## 2021-08-01 DIAGNOSIS — Z9221 Personal history of antineoplastic chemotherapy: Secondary | ICD-10-CM

## 2021-08-01 DIAGNOSIS — Z6828 Body mass index (BMI) 28.0-28.9, adult: Secondary | ICD-10-CM

## 2021-08-01 DIAGNOSIS — Z800 Family history of malignant neoplasm of digestive organs: Secondary | ICD-10-CM

## 2021-08-01 DIAGNOSIS — Z20822 Contact with and (suspected) exposure to covid-19: Secondary | ICD-10-CM | POA: Diagnosis present

## 2021-08-01 DIAGNOSIS — Z79899 Other long term (current) drug therapy: Secondary | ICD-10-CM

## 2021-08-01 DIAGNOSIS — B9620 Unspecified Escherichia coli [E. coli] as the cause of diseases classified elsewhere: Secondary | ICD-10-CM | POA: Diagnosis present

## 2021-08-01 DIAGNOSIS — E861 Hypovolemia: Secondary | ICD-10-CM | POA: Diagnosis not present

## 2021-08-01 LAB — COMPREHENSIVE METABOLIC PANEL
ALT: 13 U/L (ref 0–44)
AST: 18 U/L (ref 15–41)
Albumin: 3.4 g/dL — ABNORMAL LOW (ref 3.5–5.0)
Alkaline Phosphatase: 63 U/L (ref 38–126)
Anion gap: 15 (ref 5–15)
BUN: 11 mg/dL (ref 6–20)
CO2: 20 mmol/L — ABNORMAL LOW (ref 22–32)
Calcium: 9 mg/dL (ref 8.9–10.3)
Chloride: 99 mmol/L (ref 98–111)
Creatinine, Ser: 0.43 mg/dL — ABNORMAL LOW (ref 0.44–1.00)
GFR, Estimated: >60 mL/min (ref >60)
Glucose, Bld: 92 mg/dL (ref 70–99)
Potassium: 4 mmol/L (ref 3.5–5.1)
Sodium: 134 mmol/L — ABNORMAL LOW (ref 135–145)
Total Bilirubin: 1.2 mg/dL (ref 0.3–1.2)
Total Protein: 6.9 g/dL (ref 6.5–8.1)

## 2021-08-01 LAB — CBC
HCT: 36.3 % (ref 36.0–46.0)
Hemoglobin: 11.5 g/dL — ABNORMAL LOW (ref 12.0–15.0)
MCH: 26.7 pg (ref 26.0–34.0)
MCHC: 31.7 g/dL (ref 30.0–36.0)
MCV: 84.2 fL (ref 80.0–100.0)
Platelets: 276 10*3/uL (ref 150–400)
RBC: 4.31 MIL/uL (ref 3.87–5.11)
RDW: 14.7 % (ref 11.5–15.5)
WBC: 11.6 10*3/uL — ABNORMAL HIGH (ref 4.0–10.5)
nRBC: 0 % (ref 0.0–0.2)

## 2021-08-01 LAB — URINALYSIS, ROUTINE W REFLEX MICROSCOPIC
Bacteria, UA: NONE SEEN
Bilirubin Urine: NEGATIVE
Glucose, UA: NEGATIVE mg/dL
Hgb urine dipstick: NEGATIVE
Ketones, ur: 80 mg/dL — AB
Nitrite: NEGATIVE
Protein, ur: 100 mg/dL — AB
Specific Gravity, Urine: 1.03 (ref 1.005–1.030)
pH: 5 (ref 5.0–8.0)

## 2021-08-01 LAB — LIPASE, BLOOD: Lipase: 42 U/L (ref 11–51)

## 2021-08-01 MED ORDER — IOHEXOL 350 MG/ML SOLN
100.0000 mL | Freq: Once | INTRAVENOUS | Status: AC | PRN
  Administered 2021-08-01: 100 mL via INTRAVENOUS

## 2021-08-01 MED ORDER — PROCHLORPERAZINE EDISYLATE 10 MG/2ML IJ SOLN
10.0000 mg | Freq: Once | INTRAMUSCULAR | Status: AC
  Administered 2021-08-01: 23:00:00 10 mg via INTRAMUSCULAR
  Filled 2021-08-01: qty 2

## 2021-08-01 MED ORDER — ONDANSETRON HCL 4 MG/2ML IJ SOLN
4.0000 mg | Freq: Once | INTRAMUSCULAR | Status: AC
  Administered 2021-08-01: 4 mg via INTRAVENOUS
  Filled 2021-08-01: qty 2

## 2021-08-01 MED ORDER — LACTATED RINGERS IV BOLUS
1000.0000 mL | Freq: Once | INTRAVENOUS | Status: AC
  Administered 2021-08-01: 1000 mL via INTRAVENOUS

## 2021-08-01 MED ORDER — PROCHLORPERAZINE EDISYLATE 10 MG/2ML IJ SOLN: 10.0000 mg | Freq: Once | INTRAMUSCULAR | Status: DC

## 2021-08-01 MED ORDER — ONDANSETRON 4 MG PO TBDP
4.0000 mg | ORAL_TABLET | Freq: Once | ORAL | Status: AC
  Administered 2021-08-01: 20:00:00 4 mg via ORAL
  Filled 2021-08-01: qty 1

## 2021-08-01 MED ORDER — HYDROMORPHONE HCL 1 MG/ML IJ SOLN
1.0000 mg | Freq: Once | INTRAMUSCULAR | Status: AC
  Administered 2021-08-01: 22:00:00 1 mg via INTRAMUSCULAR
  Filled 2021-08-01: qty 1

## 2021-08-01 MED ORDER — HYDROMORPHONE HCL 1 MG/ML IJ SOLN
0.5000 mg | Freq: Once | INTRAMUSCULAR | Status: AC
  Administered 2021-08-01: 20:00:00 0.5 mg via INTRAMUSCULAR
  Filled 2021-08-01: qty 1

## 2021-08-01 MED ORDER — HYDROMORPHONE HCL 1 MG/ML IJ SOLN
1.0000 mg | Freq: Once | INTRAMUSCULAR | Status: AC
  Administered 2021-08-01: 1 mg via INTRAVENOUS
  Filled 2021-08-01: qty 1

## 2021-08-01 NOTE — ED Triage Notes (Signed)
Patient has a history of ovarian cancer. Patient c/o abdominal pain and nausea and states "it is ongoing."

## 2021-08-01 NOTE — ED Notes (Signed)
Patient stated she will need the IV team to get blood

## 2021-08-01 NOTE — Telephone Encounter (Signed)
Called regarding mychart message complaint of nausea and trouble eating. She is going to the ER at [facility] to be evaluated for abdominal distention and possible SBO. She said her abdomen is getting bigger.

## 2021-08-01 NOTE — ED Provider Notes (Signed)
[facility] DEPT Provider Note   CSN: [id number] Arrival date & time: 08/01/21  1035     History Chief Complaint  Patient presents with   Abdominal Pain    Sarah Newman is a 44 y.o. female.   Abdominal Pain Associated symptoms: nausea and vomiting   Associated symptoms: no chest pain and no shortness of breath   Patient presents with abdominal pain.  Has been over the last few days.  Worsening pain.  Worsening swelling above her supraumbilical area.  Has had an abscess there previously.  Has had a enterocutaneous fistula.  Has known metastatic ovarian cancer.  Patient states that she is living out this last year.  States she was given a year to live about 2 years ago.  No fevers.  Has had nausea and vomiting.  Decreased colonic output.  States the swelling over her umbilicus is more present but not well as it is starting to be more tender and more red.  Not having fevers.  Takes oxycodone as needed at home.  States she does not normally have to take it.  States she is feeling worse.    Past Medical History:  Diagnosis Date   Anemia due to antineoplastic chemotherapy    Arthritis    Asthma due to seasonal allergies    01-21-2021 per pt seasonal and exercised induced, does not have inhaler, last used one few years   Chronic abdominal pain 01/2021   Chronic nausea    01-21-2021  per pt intermittant nausea and when has abd pain most of time will have vomiting   Family history of breast cancer    Family history of colon cancer    Family history of ovarian cancer    Family history of prostate cancer    History of 2019 novel coronavirus disease (COVID-19) 09/08/2020   positive results in epic, per pt mild symptoms that resolved   History of Helicobacter pylori infection 07/2016   E[facility] w/ bx[facility] by [facility],  chronic h.pylori w/ gastritis,  treated  (no ulcer)   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[place]) 07/2019   oncologist--- dr gorsuch/ dr Berline Lopes---  07-21-2019 s/p exp. 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;  progression w/ abdominal carcinomatosis   On total parenteral nutrition (TPN) 02/02/2021   infused thru PAC   Port-A-Cath in place    Steroid-induced diabetes ([place])    followed by dr Alvy Bimler    Patient Active Problem List   Diagnosis Date Noted   Weight loss, non-intentional 07/17/2021   Abdominal wall abscess 05/06/2021   Cellulitis and abscess of trunk 04/05/2021   Abdominal fluid collection 04/05/2021   Port or reservoir infection    Sepsis ([place]) 03/16/2021   Candidemia ([place]) 03/16/2021   Staphylococcus epidermidis bacteremia 03/16/2021   Post-operative pain    Fever postop 03/14/2021   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   Postoperative abscess 02/02/2021   Abscess    Intermittent small bowel obstruction due to adhesions ([place]) 01/23/2021   Intra-abdominal adhesions    Nausea and vomiting 01/11/2021   Partial small bowel obstruction ([place]) 01/11/2021   Poor dentition 12/10/2020   Pancytopenia, acquired ([place]) 12/10/2020   Allergic reaction 10/15/2020   Physical debility 10/01/2020   Steroid-induced diabetes ([place]) 09/20/2020   Constipation 09/20/2020   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   Cancer associated pain 09/14/2020   Lab test positive for detection of COVID-19 virus 09/10/2020   Hypokalemia 08/30/2020   Obesity, Class II, BMI 35-39.9 07/17/2020  Anemia, chronic disease 05/29/2020   UTI (urinary tract infection) 01/09/2020   Dysuria 01/06/2020   Anemia due to antineoplastic chemotherapy 12/05/2019   Gastritis 12/05/2019   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/2019   Weight gain 10/25/2019   Vaginal dryness 10/24/2019   Drug-induced hyperglycemia 10/03/2019   Genetic testing 09/23/2019   Bone pain 09/13/2019   Restless leg 09/13/2019   Elevated liver enzymes 09/13/2019   Family history of ovarian cancer    Family history of prostate cancer    Family  history of colon cancer    Family history of breast cancer    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[place])    Pelvic mass in female 07/11/2019   Obesity 07/11/2019   Cervical high risk HPV (human papillomavirus) test positive 02/17/2017   Cholecystitis 02/16/2015    Past Surgical History:  Procedure Laterality Date   CHOLECYSTECTOMY N/A 02/16/2015   Procedure: LAPAROSCOPIC CHOLECYSTECTOMY WITH INTRAOPERATIVE CHOLANGIOGRAM;  Surgeon: Johnathan Hausen, MD;  Location: [facility];  Service: General;  Laterality: N/A;   INCISION AND DRAINAGE ABSCESS N/A 02/02/2021   Procedure: INCISION AND DRAINAGE ABSCESS;  Surgeon: Lahoma Crocker, MD;  Location: [facility];  Service: Gynecology;  Laterality: N/A;   IR IMAGING GUIDED PORT INSERTION  08/12/2019   IR REMOVAL TUN ACCESS W/ PORT W/O FL MOD SED  03/18/2021   LAPAROSCOPY N/A 01/23/2021   Procedure: LAPAROSCOPY DIAGNOSTIC;  Surgeon: Lafonda Mosses, MD;  Location: [facility];  Service: Gynecology;  Laterality: N/A;   LAPAROTOMY N/A 01/23/2021   Procedure: LAPAROTOMY, ENTEROTOMY REPAIR OF THE BOWEL;  Surgeon: Lafonda Mosses, MD;  Location[facility];  Service: Gynecology;  Laterality: N/A;   LAPAROTOMY N/A 05/03/2021   Procedure: incision and drainage abdominal collection and placement drainage catheter;  Surgeon: Lafonda Mosses, MD;  Location: [facility];  Service: Gynecology;  Laterality: N/A;   SALPINGOOPHORECTOMY N/A 07/21/2019   Procedure: EXPLORATORY LAPAROTOMY, TOTAL ABDOMINAL HYSTERECTOMY, BILATERAL SALPINGO OOPHORECTOMY, OMENTECTOMY;  Surgeon: Lafonda Mosses, MD;  Location: [facility];  Service: Gynecology;  Laterality: N/A;   UPPER GASTROINTESTINAL ENDOSCOPY  08/01/2016   by dr Corinda Gubler in [place] N/A 03/11/2021   Procedure: OPENING AND EXPLORATION OF ABDOMINAL EXCISION;  Surgeon: Lafonda Mosses, MD;  Location: [facility];  Service: Gynecology;  Laterality: N/A;     OB History      Gravida  3   Para  3   Term      Preterm      AB      Living  2      SAB      IAB      Ectopic      Multiple      Live Births  2           Family History  Problem Relation Age of Onset   Colon cancer Father 40   Prostate cancer Father 38   Colon cancer Paternal Aunt        dx. early 32s   Ovarian cancer Paternal Grandmother 16   Breast cancer Cousin    Endometriosis Mother    Cancer Paternal Uncle        unknown type, dx. early 6s   Cancer Paternal Uncle 49       unknown type   Esophageal cancer Neg Hx     Social History   Tobacco Use   Smoking status: Never   Smokeless tobacco: Never  Vaping Use   Vaping Use: Never used  Substance Use Topics   Alcohol use: Not Currently    Comment: Social drinker   Drug use: Never    Home Medications Prior to Admission medications   Medication Sig Start Date End Date Taking? Authorizing Provider  acetaminophen (TYLENOL) 325 MG tablet Take 650 mg by mouth every 6 (six) hours as needed for mild pain, fever or headache.    [provider]  acetaminophen (TYLENOL) 650 MG suppository Place 1 suppository (650 mg total) rectally every 6 (six) hours as needed for moderate pain. 02/09/21   Lafonda Mosses, MD  dexamethasone (DECADRON) 4 MG tablet Take 1/2 tablet (2 mg total) by mouth 2 (two) times daily. 07/09/21   Lafonda Mosses, MD  Docusate Sodium (COLACE PO) Take by mouth as needed.    [provider]  famotidine (PEPCID) 20 MG tablet Take 1 tablet (20 mg total) by mouth daily. 06/12/21   Lafonda Mosses, MD  metoCLOPramide (REGLAN) 10 MG tablet Take 1 tablet (10 mg total) by mouth every 8 (eight) hours as needed for nausea. 06/12/21   Lafonda Mosses, MD  omeprazole (PRILOSEC) 40 MG capsule Take 1 capsule by mouth daily. 07/19/21   Heath Lark, MD  ondansetron (ZOFRAN ODT) 4 MG disintegrating tablet Take 1 tablet (4 mg total) by mouth every 4 (four) hours as needed for up to 20  doses for nausea or vomiting. 07/17/21   Joylene John D, NP  ondansetron (ZOFRAN) 8 MG tablet Take 1 tablet (8 mg total) by mouth every 8 (eight) hours as needed for nausea or vomiting. 07/17/21   Heath Lark, MD  oxyCODONE (OXY IR/ROXICODONE) 5 MG immediate release tablet Take 1 tablet (5 mg total) by mouth every 4 (four) hours as needed for severe pain. 07/26/21   Lafonda Mosses, MD  Polyethylene Glycol 3350 (MIRALAX PO) Take by mouth.    [provider]  prochlorperazine (COMPAZINE) 10 MG tablet Take 1 tablet (10 mg total) by mouth every 8 (eight) hours as needed for nausea or vomiting. 07/09/21   Lafonda Mosses, MD  promethazine (PHENERGAN) 12.5 MG suppository Place 1 suppository (12.5 mg total) rectally every 8 (eight) hours as needed for up to 30 doses for nausea or vomiting. 03/15/21   Lucrezia Starch, MD    Allergies    Carboplatin  Review of Systems   Review of Systems  Constitutional:  Positive for appetite change.  HENT:  Negative for congestion.   Respiratory:  Negative for shortness of breath.   Cardiovascular:  Negative for chest pain.  Gastrointestinal:  Positive for abdominal distention, abdominal pain, nausea and vomiting.  Genitourinary:  Negative for flank pain.  Musculoskeletal:  Negative for back pain.  Skin:  Negative for rash.  Neurological:  Negative for weakness.  Psychiatric/Behavioral:  Negative for confusion.    Physical Exam Updated Vital Signs BP 98/68    Pulse 85    Temp 99.1 F (37.3 C) (Oral)    Resp 14    Ht 5\' 2"  (1.575 m)    Wt 72.6 kg    LMP 07/05/2019    SpO2 99%    BMI 29.26 kg/m   Physical Exam Vitals and nursing note reviewed.  HENT:     Head: Atraumatic.  Abdominal:     Comments: Tender supraumbilical swelling.  Approximately 8 cm across.  Neurological:     Mental Status: She is alert.    ED Results / Procedures /  Treatments   Labs (all labs ordered are listed, but only abnormal results are displayed) Labs  Reviewed  COMPREHENSIVE METABOLIC PANEL - Abnormal; Notable for the following components:      Result Value   Sodium 134 (*)    CO2 20 (*)    Creatinine, Ser 0.43 (*)    Albumin 3.4 (*)    All other components within normal limits  CBC - Abnormal; Notable for the following components:   WBC 11.6 (*)    Hemoglobin 11.5 (*)    All other components within normal limits  URINALYSIS, ROUTINE W REFLEX MICROSCOPIC - Abnormal; Notable for the following components:   APPearance HAZY (*)    Ketones, ur 80 (*)    Protein, ur 100 (*)    Leukocytes,Ua SMALL (*)    All other components within normal limits  LIPASE, BLOOD    EKG None  Radiology No results found.  Procedures Procedures   Medications Ordered in ED Medications  lactated ringers bolus 1,000 mL (has no administration in time range)  HYDROmorphone (DILAUDID) injection 1 mg (has no administration in time range)  ondansetron (ZOFRAN) injection 4 mg (has no administration in time range)  HYDROmorphone (DILAUDID) injection 0.5 mg (0.5 mg Intramuscular Given 08/01/21 2022)  ondansetron (ZOFRAN-ODT) disintegrating tablet 4 mg (4 mg Oral Given 08/01/21 2021)  HYDROmorphone (DILAUDID) injection 1 mg (1 mg Intramuscular Given 08/01/21 2204)  prochlorperazine (COMPAZINE) injection 10 mg (10 mg Intramuscular Given 08/01/21 2239)  iohexol (OMNIPAQUE) 350 MG/ML injection 100 mL (100 mLs Intravenous Contrast Given 08/01/21 2333)    ED Course  I have reviewed the triage vital signs and the nursing notes.  Pertinent labs & imaging results that were available during my care of the patient were reviewed by me and considered in my medical decision making (see chart for details).    MDM Rules/Calculators/A&P                         Patient with abdominal pain.  Known abdominal cancer with previous enterocutaneous fistula.  Severe adhesions.  Now nausea vomiting abdominal distention and swelling over the supraumbilical area.  History of an  abscess at the spot.  Fluid bolus given.  Antiemetics and pain medicines given.  White count mildly elevated.  CT scan pending.  Care turned over to Dr. Laverta Baltimore.  Has seen Dr. Berline Lopes previously.    Final Clinical Impression(s) / ED Diagnoses Final diagnoses:  None    Rx / DC Orders ED Discharge Orders     None        Davonna Belling, MD 08/01/21 [phone]

## 2021-08-02 ENCOUNTER — Inpatient Hospital Stay (HOSPITAL_COMMUNITY): Payer: Medicaid Other

## 2021-08-02 ENCOUNTER — Encounter (HOSPITAL_COMMUNITY): Payer: Self-pay | Admitting: Internal Medicine

## 2021-08-02 ENCOUNTER — Inpatient Hospital Stay: Payer: Medicaid Other | Admitting: Dietician

## 2021-08-02 DIAGNOSIS — E871 Hypo-osmolality and hyponatremia: Secondary | ICD-10-CM | POA: Diagnosis present

## 2021-08-02 DIAGNOSIS — Z8616 Personal history of COVID-19: Secondary | ICD-10-CM | POA: Diagnosis not present

## 2021-08-02 DIAGNOSIS — G2581 Restless legs syndrome: Secondary | ICD-10-CM | POA: Diagnosis present

## 2021-08-02 DIAGNOSIS — K219 Gastro-esophageal reflux disease without esophagitis: Secondary | ICD-10-CM | POA: Diagnosis present

## 2021-08-02 DIAGNOSIS — E876 Hypokalemia: Secondary | ICD-10-CM | POA: Diagnosis present

## 2021-08-02 DIAGNOSIS — Z888 Allergy status to other drugs, medicaments and biological substances status: Secondary | ICD-10-CM | POA: Diagnosis not present

## 2021-08-02 DIAGNOSIS — R1030 Lower abdominal pain, unspecified: Secondary | ICD-10-CM | POA: Diagnosis present

## 2021-08-02 DIAGNOSIS — M1990 Unspecified osteoarthritis, unspecified site: Secondary | ICD-10-CM | POA: Diagnosis present

## 2021-08-02 DIAGNOSIS — E440 Moderate protein-calorie malnutrition: Secondary | ICD-10-CM | POA: Diagnosis present

## 2021-08-02 DIAGNOSIS — C7961 Secondary malignant neoplasm of right ovary: Secondary | ICD-10-CM | POA: Diagnosis present

## 2021-08-02 DIAGNOSIS — B961 Klebsiella pneumoniae [K. pneumoniae] as the cause of diseases classified elsewhere: Secondary | ICD-10-CM | POA: Diagnosis present

## 2021-08-02 DIAGNOSIS — Z803 Family history of malignant neoplasm of breast: Secondary | ICD-10-CM | POA: Diagnosis not present

## 2021-08-02 DIAGNOSIS — B964 Proteus (mirabilis) (morganii) as the cause of diseases classified elsewhere: Secondary | ICD-10-CM | POA: Diagnosis present

## 2021-08-02 DIAGNOSIS — Z79899 Other long term (current) drug therapy: Secondary | ICD-10-CM | POA: Diagnosis not present

## 2021-08-02 DIAGNOSIS — C786 Secondary malignant neoplasm of retroperitoneum and peritoneum: Secondary | ICD-10-CM | POA: Diagnosis present

## 2021-08-02 DIAGNOSIS — K56609 Unspecified intestinal obstruction, unspecified as to partial versus complete obstruction: Secondary | ICD-10-CM | POA: Diagnosis present

## 2021-08-02 DIAGNOSIS — R188 Other ascites: Secondary | ICD-10-CM | POA: Diagnosis present

## 2021-08-02 DIAGNOSIS — Z800 Family history of malignant neoplasm of digestive organs: Secondary | ICD-10-CM | POA: Diagnosis not present

## 2021-08-02 DIAGNOSIS — K632 Fistula of intestine: Secondary | ICD-10-CM | POA: Diagnosis present

## 2021-08-02 DIAGNOSIS — Z8041 Family history of malignant neoplasm of ovary: Secondary | ICD-10-CM | POA: Diagnosis not present

## 2021-08-02 DIAGNOSIS — D649 Anemia, unspecified: Secondary | ICD-10-CM | POA: Diagnosis present

## 2021-08-02 DIAGNOSIS — C772 Secondary and unspecified malignant neoplasm of intra-abdominal lymph nodes: Secondary | ICD-10-CM | POA: Diagnosis present

## 2021-08-02 DIAGNOSIS — L02211 Cutaneous abscess of abdominal wall: Secondary | ICD-10-CM | POA: Diagnosis present

## 2021-08-02 DIAGNOSIS — Z8042 Family history of malignant neoplasm of prostate: Secondary | ICD-10-CM | POA: Diagnosis not present

## 2021-08-02 DIAGNOSIS — Z20822 Contact with and (suspected) exposure to covid-19: Secondary | ICD-10-CM | POA: Diagnosis present

## 2021-08-02 LAB — CBC
HCT: 33.2 % — ABNORMAL LOW (ref 36.0–46.0)
Hemoglobin: 10.7 g/dL — ABNORMAL LOW (ref 12.0–15.0)
MCH: 26.6 pg (ref 26.0–34.0)
MCHC: 32.2 g/dL (ref 30.0–36.0)
MCV: 82.4 fL (ref 80.0–100.0)
Platelets: 283 10*3/uL (ref 150–400)
RBC: 4.03 MIL/uL (ref 3.87–5.11)
RDW: 14.7 % (ref 11.5–15.5)
WBC: 10.4 10*3/uL (ref 4.0–10.5)
nRBC: 0 % (ref 0.0–0.2)

## 2021-08-02 LAB — COMPREHENSIVE METABOLIC PANEL
ALT: 13 U/L (ref 0–44)
AST: 13 U/L — ABNORMAL LOW (ref 15–41)
Albumin: 3.1 g/dL — ABNORMAL LOW (ref 3.5–5.0)
Alkaline Phosphatase: 60 U/L (ref 38–126)
Anion gap: 10 (ref 5–15)
BUN: 9 mg/dL (ref 6–20)
CO2: 23 mmol/L (ref 22–32)
Calcium: 8.5 mg/dL — ABNORMAL LOW (ref 8.9–10.3)
Chloride: 98 mmol/L (ref 98–111)
Creatinine, Ser: 0.42 mg/dL — ABNORMAL LOW (ref 0.44–1.00)
GFR, Estimated: >60 mL/min (ref >60)
Glucose, Bld: 116 mg/dL — ABNORMAL HIGH (ref 70–99)
Potassium: 3.3 mmol/L — ABNORMAL LOW (ref 3.5–5.1)
Sodium: 131 mmol/L — ABNORMAL LOW (ref 135–145)
Total Bilirubin: 1 mg/dL (ref 0.3–1.2)
Total Protein: 6.4 g/dL — ABNORMAL LOW (ref 6.5–8.1)

## 2021-08-02 LAB — IRON AND TIBC
Iron: 18 ug/dL — ABNORMAL LOW (ref 28–170)
Saturation Ratios: 6 % — ABNORMAL LOW (ref 10.4–31.8)
TIBC: 307 ug/dL (ref 250–450)
UIBC: 289 ug/dL

## 2021-08-02 LAB — RESP PANEL BY RT-PCR (FLU A&B, COVID) ARPGX2
Influenza A by PCR: NEGATIVE
Influenza B by PCR: NEGATIVE
SARS Coronavirus 2 by RT PCR: NEGATIVE

## 2021-08-02 LAB — MAGNESIUM: Magnesium: 1.6 mg/dL — ABNORMAL LOW (ref 1.7–2.4)

## 2021-08-02 MED ORDER — HYDROMORPHONE HCL 1 MG/ML IJ SOLN
0.5000 mg | INTRAMUSCULAR | Status: DC | PRN
  Administered 2021-08-02 (×4): 0.5 mg via INTRAVENOUS
  Filled 2021-08-02: qty 1
  Filled 2021-08-02: qty 0.5
  Filled 2021-08-02 (×2): qty 1

## 2021-08-02 MED ORDER — METHOCARBAMOL 1000 MG/10ML IJ SOLN
500.0000 mg | Freq: Once | INTRAVENOUS | Status: AC
  Administered 2021-08-02: 21:00:00 500 mg via INTRAVENOUS
  Filled 2021-08-02: qty 500

## 2021-08-02 MED ORDER — LIDOCAINE VISCOUS HCL 2 % MT SOLN
15.0000 mL | Freq: Once | OROMUCOSAL | Status: AC
  Administered 2021-08-02: 02:00:00 15 mL via OROMUCOSAL
  Filled 2021-08-02: qty 15

## 2021-08-02 MED ORDER — ACETAMINOPHEN 650 MG RE SUPP: 650.0000 mg | Freq: Four times a day (QID) | RECTAL | Status: DC | PRN

## 2021-08-02 MED ORDER — MIDAZOLAM HCL 2 MG/2ML IJ SOLN
INTRAMUSCULAR | Status: AC | PRN
  Administered 2021-08-02: 1 mg via INTRAVENOUS

## 2021-08-02 MED ORDER — HYDROCODONE-ACETAMINOPHEN 5-325 MG PO TABS: 1.0000 | ORAL_TABLET | ORAL | Status: DC | PRN

## 2021-08-02 MED ORDER — FENTANYL CITRATE (PF) 100 MCG/2ML IJ SOLN
INTRAMUSCULAR | Status: AC
  Filled 2021-08-02: qty 2

## 2021-08-02 MED ORDER — PIPERACILLIN-TAZOBACTAM 3.375 G IVPB
3.3750 g | Freq: Three times a day (TID) | INTRAVENOUS | Status: DC
  Administered 2021-08-03 – 2021-08-07 (×13): 3.375 g via INTRAVENOUS
  Filled 2021-08-02 (×14): qty 50

## 2021-08-02 MED ORDER — NALOXONE HCL 0.4 MG/ML IJ SOLN
INTRAMUSCULAR | Status: AC
  Filled 2021-08-02: qty 1

## 2021-08-02 MED ORDER — POTASSIUM CHLORIDE 10 MEQ/100ML IV SOLN
10.0000 meq | INTRAVENOUS | Status: AC
  Administered 2021-08-02 (×2): 10 meq via INTRAVENOUS
  Filled 2021-08-02 (×2): qty 100

## 2021-08-02 MED ORDER — MAGNESIUM SULFATE 2 GM/50ML IV SOLN
2.0000 g | Freq: Once | INTRAVENOUS | Status: AC
  Administered 2021-08-02: 11:00:00 2 g via INTRAVENOUS
  Filled 2021-08-02: qty 50

## 2021-08-02 MED ORDER — PIPERACILLIN-TAZOBACTAM 3.375 G IVPB 30 MIN
3.3750 g | Freq: Once | INTRAVENOUS | Status: AC
Start: 2021-08-02 — End: 2021-08-02
  Administered 2021-08-02: 21:00:00 3.375 g via INTRAVENOUS
  Filled 2021-08-02: qty 50

## 2021-08-02 MED ORDER — SODIUM CHLORIDE 0.9 % IV SOLN: INTRAVENOUS | Status: DC

## 2021-08-02 MED ORDER — NALOXONE HCL 0.4 MG/ML IJ SOLN: 0.4000 mg | INTRAMUSCULAR | Status: DC | PRN

## 2021-08-02 MED ORDER — HYDROMORPHONE HCL 2 MG/ML IJ SOLN
2.0000 mg | INTRAMUSCULAR | Status: DC | PRN
  Administered 2021-08-02 – 2021-08-04 (×19): 2 mg via INTRAVENOUS
  Filled 2021-08-02 (×20): qty 1

## 2021-08-02 MED ORDER — FENTANYL CITRATE (PF) 100 MCG/2ML IJ SOLN
INTRAMUSCULAR | Status: AC | PRN
  Administered 2021-08-02: 25 ug via INTRAVENOUS

## 2021-08-02 MED ORDER — ACETAMINOPHEN 325 MG PO TABS: 650.0000 mg | ORAL_TABLET | Freq: Four times a day (QID) | ORAL | Status: DC | PRN

## 2021-08-02 MED ORDER — ONDANSETRON HCL 4 MG/2ML IJ SOLN
4.0000 mg | Freq: Four times a day (QID) | INTRAMUSCULAR | Status: DC | PRN
  Administered 2021-08-02 (×3): 4 mg via INTRAVENOUS
  Filled 2021-08-02 (×4): qty 2

## 2021-08-02 MED ORDER — LIDOCAINE HCL (PF) 1 % IJ SOLN
INTRAMUSCULAR | Status: AC | PRN
  Administered 2021-08-02: 10 mL via INTRADERMAL

## 2021-08-02 MED ORDER — LIDOCAINE HCL URETHRAL/MUCOSAL 2 % EX GEL
1.0000 "application " | Freq: Once | CUTANEOUS | Status: AC
  Administered 2021-08-02: 1
  Filled 2021-08-02: qty 11

## 2021-08-02 MED ORDER — MIDAZOLAM HCL 2 MG/2ML IJ SOLN
INTRAMUSCULAR | Status: AC
  Filled 2021-08-02: qty 4

## 2021-08-02 MED ORDER — SODIUM CHLORIDE 0.9 % IV SOLN
INTRAVENOUS | Status: AC | PRN
  Administered 2021-08-02: 250 mL via INTRAVENOUS

## 2021-08-02 MED ORDER — SODIUM CHLORIDE 0.9 % IV SOLN
INTRAVENOUS | Status: AC
  Filled 2021-08-02: qty 250

## 2021-08-02 MED ORDER — LACTATED RINGERS IV SOLN: INTRAVENOUS | Status: DC

## 2021-08-02 MED ORDER — POTASSIUM CHLORIDE 10 MEQ/100ML IV SOLN
10.0000 meq | INTRAVENOUS | Status: AC
  Administered 2021-08-02 (×2): 10 meq via INTRAVENOUS
  Filled 2021-08-02: qty 100

## 2021-08-02 MED ORDER — DIATRIZOATE MEGLUMINE & SODIUM 66-10 % PO SOLN
90.0000 mL | Freq: Once | ORAL | Status: AC
  Administered 2021-08-02: 18:00:00 90 mL via NASOGASTRIC
  Filled 2021-08-02: qty 90

## 2021-08-02 MED ORDER — FLUMAZENIL 0.5 MG/5ML IV SOLN
INTRAVENOUS | Status: AC
  Filled 2021-08-02: qty 5

## 2021-08-02 NOTE — ED Notes (Signed)
Patient's NG tube came out. Patient requesting lidocaine for reinsertion.

## 2021-08-02 NOTE — ED Provider Notes (Signed)
Blood pressure 98/68, pulse 85, temperature 99.1 F (37.3 C), temperature source Oral, resp. rate 14, height 5\' 2"  (1.575 m), weight 72.6 kg, last menstrual period 07/05/2019, SpO2 99 %.  Assuming care from Dr. Alvino Chapel.  In short, Sarah Newman is a 45 y.o. female with a chief complaint of Abdominal Pain .  Refer to the original H&P for additional details.  The current plan of care is to f/u on CT abdomen/pelvis.  12:14 AM  Reviewed the patient CT scan showing evidence of obstruction with transition point.  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  Spoke with Dr. Julian Hy with Gyn/Onc.  They do not plan on surgical management at this time.  Agree with NG tube and will attempt to manage medically.  They will consult as an inpatient but request hospitalist admission.   Discussed patient's case with [facility] to request admission. Patient and family (if present) updated with plan. Care transferred to [facility] service.  I reviewed all nursing notes, vitals, pertinent old records, EKGs, labs, imaging (as available).     Margette Fast, MD 08/02/21 [phone]

## 2021-08-02 NOTE — ED Notes (Signed)
Patient transported to IR 

## 2021-08-02 NOTE — Procedures (Signed)
°  Procedure: CT guided aspiration/biopsy anterior abd wall complex mass 3ml purulent for GS, C&S; 18g core x3 to surg path EBL:   minimal Complications:  none immediate  See full dictation in [facility].  Dillard Cannon MD Main # [phone] Pager  [phone] Mobile [phone]

## 2021-08-02 NOTE — Progress Notes (Signed)
Carryover to day admitter; I discussed pt's case with EDP, Dr. Laverta Baltimore. Per these discussions:  This is a 45 year old female with metastatic ovarian cancer who follows with Dr.Tucker of gyn-onc), Who is being admitted for small bowel obstruction after presenting with progressive nausea, vomiting, abdominal discomfort over the last few days, with ensuing imaging revealing evidence of small bowel obstruction.  She has history of multiple prior small bowel obstructions.  Dr. Laverta Baltimore discussed case with Dr.Desources Of gyn-onc, who does not recommend any additional surgical intervention for this patient, but rather recommended initiation of conservative, medical management. Gyn-onc to formally consult on the patient in the morning. Dr. Laverta Baltimore has placed order for NGT.   I have placed order to admit to med-tele for further evaluation , management of small bowel striction.  I have also Placed basic, preliminary admission orders via the adult multi morbid admission order set, including n.p.o., LR at 100 cc/h, as needed IV Dilaudid, prn IV Zofran.  Also place orders for a.m. labs.     Babs Bertin, DO Hospitalist

## 2021-08-02 NOTE — Progress Notes (Signed)
Brief update Spoke with patient, she overall is feeling better and tolerated the procedure well.  She is having some increased abdominal pain now that anesthesia is wearing off.  Denies any nausea or emesis with NG in place.  NG had to be replaced.  Is feeling like she may need to have some bowel function. Discussed findings of purulent fluid from aspiration earlier.  Given this, will start broad-spectrum antibiotics with Zosyn.  Can tailor based on culture results. Jeral Pinch MD Gynecologic Oncology

## 2021-08-02 NOTE — Progress Notes (Signed)
Patient scheduled for telephone appointment today. Patient presented to [facility] Emergency Department on 12/29 and is currently hospitalized with SBO. Will reschedule nutrition appointment when able.

## 2021-08-02 NOTE — H&P (Signed)
History and Physical    Sarah Newman HKV:[id number] DOB: [birth year] DOA: 08/01/2021  PCP: Heath Lark, MD  Patient coming from: Home  Chief Complaint: abdominal pain  HPI: Sarah Newman is a 45 y.o. female with medical history significant of ovarian cancer, moderate protein calorie malnutrition, chronic nausea, chronic ab pain. Presenting with global abdominal pain. Her pain started 2 days ago. She noticed the area of her fistula started to become hard and swell. This was accompanied by nausea and vomiting. She did not have any diarrhea or fever. She reports her last BM was 3 - 4 days ago. Since her symptoms did not improve yesterday, she decided to come to the ED for help. She denies any other aggravating or alleviating factors.   ED Course: CT showed SBO and recurrent cutaneous abscess. OB-Gyn was consulted. TRH was called for admission.   Review of Systems:  Denies CP, palpitations, dyspnea, diarrhea, fever, sick contacts. Review of systems is otherwise negative for all not mentioned in HPI.   PMHx Past Medical History:  Diagnosis Date   Anemia due to antineoplastic chemotherapy    Arthritis    Asthma due to seasonal allergies    01-21-2021 per pt seasonal and exercised induced, does not have inhaler, last used one few years   Chronic abdominal pain 01/2021   Chronic nausea    01-21-2021  per pt intermittant nausea and when has abd pain most of time will have vomiting   Family history of breast cancer    Family history of colon cancer    Family history of ovarian cancer    Family history of prostate cancer    History of 2019 novel coronavirus disease (COVID-19) 09/08/2020   positive results in epic, per pt mild symptoms that resolved   History of Helicobacter pylori infection 07/2016   EGD w/ bx's by [facility],  chronic h.pylori w/ gastritis,  treated  (no ulcer)   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[place]) 07/2019   oncologist--- dr gorsuch/ dr Berline Lopes--- 07-21-2019 s/p exp.  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;  progression w/ abdominal carcinomatosis   On total parenteral nutrition (TPN) 02/02/2021   infused thru PAC   Port-A-Cath in place    Steroid-induced diabetes ([place])    followed by dr Alvy Bimler    PSHx Past Surgical History:  Procedure Laterality Date   CHOLECYSTECTOMY N/A 02/16/2015   Procedure: LAPAROSCOPIC CHOLECYSTECTOMY WITH INTRAOPERATIVE CHOLANGIOGRAM;  Surgeon: Johnathan Hausen, MD;  Location: [facility];  Service: General;  Laterality: N/A;   INCISION AND DRAINAGE ABSCESS N/A 02/02/2021   Procedure: INCISION AND DRAINAGE ABSCESS;  Surgeon: Lahoma Crocker, MD;  Location: [facility];  Service: Gynecology;  Laterality: N/A;   IR IMAGING GUIDED PORT INSERTION  08/12/2019   IR REMOVAL TUN ACCESS W/ PORT W/O FL MOD SED  03/18/2021   LAPAROSCOPY N/A 01/23/2021   Procedure: LAPAROSCOPY DIAGNOSTIC;  Surgeon: Lafonda Mosses, MD;  Location: [facility];  Service: Gynecology;  Laterality: N/A;   LAPAROTOMY N/A 01/23/2021   Procedure: LAPAROTOMY, ENTEROTOMY REPAIR OF THE BOWEL;  Surgeon: Lafonda Mosses, MD;  Location: [facility];  Service: Gynecology;  Laterality: N/A;   LAPAROTOMY N/A 05/03/2021   Procedure: incision and drainage abdominal collection and placement drainage catheter;  Surgeon: Lafonda Mosses, MD;  Location: [facility];  Service: Gynecology;  Laterality: N/A;   SALPINGOOPHORECTOMY N/A 07/21/2019   Procedure: EXPLORATORY LAPAROTOMY, TOTAL ABDOMINAL HYSTERECTOMY, BILATERAL SALPINGO OOPHORECTOMY, OMENTECTOMY;  Surgeon: Berline Lopes,  Corinna Lines, MD;  Location: [facility];  Service: Gynecology;  Laterality: N/A;   UPPER GASTROINTESTINAL ENDOSCOPY  08/01/2016   by dr Corinda Gubler in [place] N/A 03/11/2021   Procedure: OPENING AND EXPLORATION OF ABDOMINAL EXCISION;  Surgeon: Lafonda Mosses, MD;  Location: [facility];  Service: Gynecology;  Laterality: N/A;     SocHx  reports that she has never smoked. She has never used smokeless tobacco. She reports that she does not currently use alcohol. She reports that she does not use drugs.  Allergies  Allergen Reactions   Carboplatin Shortness Of Breath    FamHx Family History  Problem Relation Age of Onset   Colon cancer Father 32   Prostate cancer Father 81   Colon cancer Paternal Aunt        dx. early 55s   Ovarian cancer Paternal Grandmother 42   Breast cancer Cousin    Endometriosis Mother    Cancer Paternal Uncle        unknown type, dx. early 72s   Cancer Paternal Uncle 56       unknown type   Esophageal cancer Neg Hx     Prior to Admission medications   Medication Sig Start Date End Date Taking? Authorizing Provider  acetaminophen (TYLENOL) 325 MG tablet Take 650 mg by mouth every 6 (six) hours as needed for mild pain, fever or headache.    [provider]  acetaminophen (TYLENOL) 650 MG suppository Place 1 suppository (650 mg total) rectally every 6 (six) hours as needed for moderate pain. 02/09/21   Lafonda Mosses, MD  dexamethasone (DECADRON) 4 MG tablet Take 1/2 tablet (2 mg total) by mouth 2 (two) times daily. 07/09/21   Lafonda Mosses, MD  Docusate Sodium (COLACE PO) Take by mouth as needed.    [provider]  famotidine (PEPCID) 20 MG tablet Take 1 tablet (20 mg total) by mouth daily. 06/12/21   Lafonda Mosses, MD  metoCLOPramide (REGLAN) 10 MG tablet Take 1 tablet (10 mg total) by mouth every 8 (eight) hours as needed for nausea. 06/12/21   Lafonda Mosses, MD  omeprazole (PRILOSEC) 40 MG capsule Take 1 capsule by mouth daily. 07/19/21   Heath Lark, MD  ondansetron (ZOFRAN ODT) 4 MG disintegrating tablet Take 1 tablet (4 mg total) by mouth every 4 (four) hours as needed for up to 20 doses for nausea or vomiting. 07/17/21   Joylene John D, NP  ondansetron (ZOFRAN) 8 MG tablet Take 1 tablet (8 mg total) by mouth every 8 (eight) hours as  needed for nausea or vomiting. 07/17/21   Heath Lark, MD  oxyCODONE (OXY IR/ROXICODONE) 5 MG immediate release tablet Take 1 tablet (5 mg total) by mouth every 4 (four) hours as needed for severe pain. 07/26/21   Lafonda Mosses, MD  Polyethylene Glycol 3350 (MIRALAX PO) Take by mouth.    [provider]  prochlorperazine (COMPAZINE) 10 MG tablet Take 1 tablet (10 mg total) by mouth every 8 (eight) hours as needed for nausea or vomiting. 07/09/21   Lafonda Mosses, MD  promethazine (PHENERGAN) 12.5 MG suppository Place 1 suppository (12.5 mg total) rectally every 8 (eight) hours as needed for up to 30 doses for nausea or vomiting. 03/15/21   Lucrezia Starch, MD    Physical Exam: Vitals:   08/02/21 0300 08/02/21 0400 08/02/21 0500 08/02/21 0631  BP: 107/81 105/75 115/89 106/69  Pulse: 84 85 79 88  Resp:    18  Temp:      TempSrc:      SpO2: 95% 96% 96% 96%  Weight:      Height:        General: 45 y.o. female resting in bed in NAD Eyes: PERRL, normal sclera ENMT: Nares patent w/o discharge, orophaynx clear, dentition normal, ears w/o discharge/lesions/ulcers Neck: Supple, trachea midline Cardiovascular: RRR, +S1, S2, no m/g/r, equal pulses throughout Respiratory: CTABL, no w/r/r, normal WOB GI: BS hypoactive, mild distention w/ a distinct hardness periumbilically, TTP in the upper quandrants, chronic scarring noted Neuro: A&O x 3, no focal deficits Psyc: Appropriate interaction and affect, calm/cooperative  Labs on Admission: I have personally reviewed following labs and imaging studies  CBC: Recent Labs  Lab 08/01/21 2137 08/02/21 0524  WBC 11.6* 10.4  HGB 11.5* 10.7*  HCT 36.3 33.2*  MCV 84.2 82.4  PLT 276 010   Basic Metabolic Panel: Recent Labs  Lab 08/01/21 2137 08/02/21 0524  NA 134* 131*  K 4.0 3.3*  CL 99 98  CO2 20* 23  GLUCOSE 92 116*  BUN 11 9  CREATININE 0.43* 0.42*  CALCIUM 9.0 8.5*  MG  --  1.6*   GFR: Estimated Creatinine  Clearance: 83.7 mL/min (A) (by C-G formula based on SCr of 0.42 mg/dL (L)). Liver Function Tests: Recent Labs  Lab 08/01/21 2137 08/02/21 0524  AST 18 13*  ALT 13 13  ALKPHOS 63 60  BILITOT 1.2 1.0  PROT 6.9 6.4*  ALBUMIN 3.4* 3.1*   Recent Labs  Lab 08/01/21 2137  LIPASE 42   No results for input(s): AMMONIA in the last 168 hours. Coagulation Profile: No results for input(s): INR, PROTIME in the last 168 hours. Cardiac Enzymes: No results for input(s): CKTOTAL, CKMB, CKMBINDEX, TROPONINI in the last 168 hours. BNP (last 3 results) No results for input(s): PROBNP in the last 8760 hours. HbA1C: No results for input(s): HGBA1C in the last 72 hours. CBG: No results for input(s): GLUCAP in the last 168 hours. Lipid Profile: No results for input(s): CHOL, HDL, LDLCALC, TRIG, CHOLHDL, LDLDIRECT in the last 72 hours. Thyroid Function Tests: No results for input(s): TSH, T4TOTAL, FREET4, T3FREE, THYROIDAB in the last 72 hours. Anemia Panel: No results for input(s): VITAMINB12, FOLATE, FERRITIN, TIBC, IRON, RETICCTPCT in the last 72 hours. Urine analysis:    Component Value Date/Time   COLORURINE YELLOW 08/01/2021 1924   APPEARANCEUR HAZY (A) 08/01/2021 1924   LABSPEC 1.030 08/01/2021 1924   PHURINE 5.0 08/01/2021 1924   GLUCOSEU NEGATIVE 08/01/2021 1924   HGBUR NEGATIVE 08/01/2021 1924   BILIRUBINUR NEGATIVE 08/01/2021 1924   KETONESUR 80 (A) 08/01/2021 1924   PROTEINUR 100 (A) 08/01/2021 1924   UROBILINOGEN 0.2 02/15/2015 2140   NITRITE NEGATIVE 08/01/2021 1924   LEUKOCYTESUR SMALL (A) 08/01/2021 1924    Radiological Exams on Admission: DG Abd 1 View  Result Date: 08/02/2021 CLINICAL DATA:  Nasogastric tube placement. EXAM: ABDOMEN - 1 VIEW COMPARISON:  Abdominal plain film dated May 04, 2021 and abdomen and pelvis CT dated August 01, 2021 FINDINGS: A nasogastric tube is seen with its distal tip looped within the gastric antrum. Dilated small bowel loops are seen  within the mid to upper abdomen. Radiopaque contrast is seen within the urinary bladder and bilateral renal collecting systems. Radiopaque surgical clips are noted within the right upper quadrant. No radio-opaque calculi or other significant radiographic abnormality are seen. IMPRESSION: Nasogastric tube positioning, as described above. Electronically Signed  By: Virgina Norfolk M.D.   On: 08/02/2021 02:57   CT ABDOMEN PELVIS W CONTRAST  Result Date: 08/01/2021 CLINICAL DATA:  Bowel obstruction, history of prior fistula EXAM: CT ABDOMEN AND PELVIS WITH CONTRAST TECHNIQUE: Multidetector CT imaging of the abdomen and pelvis was performed using the standard protocol following bolus administration of intravenous contrast. CONTRAST:  164mL OMNIPAQUE IOHEXOL 350 MG/ML SOLN COMPARISON:  07/08/2021 FINDINGS: Lower chest: No acute pleural or parenchymal lung disease. Hepatobiliary: Gallbladder is surgically absent. Postsurgical dilatation of the common bile duct measuring up to 11 mm. No focal liver abnormality. Pancreas: Unremarkable. No pancreatic ductal dilatation or surrounding inflammatory changes. Spleen: Normal in size without focal abnormality. Adrenals/Urinary Tract: Kidneys enhance normally and symmetrically. The adrenals are unremarkable. Bladder is completely decompressed, limiting its evaluation. Stomach/Bowel: There is dilation and hyperenhancement of the mucosa of the duodenum and proximal jejunum, measuring up to 3.7 cm in maximal diameter. The distal small bowel and colon are relatively decompressed. Transition point is in the region of prior abdominal wall fistula, consistent with adhesions. Multilocular fluid collections are seen within the anterior abdominal wall at the site of previous enterocutaneous fistula, measuring up to 2.0 cm. It is unclear whether these reflect recurrent fistula and bowel contents, evaluation limited without oral contrast. Multiple subcutaneous abscesses could also give  this appearance. Vascular/Lymphatic: No significant vascular findings are present. No enlarged abdominal or pelvic lymph nodes. Reproductive: Status post hysterectomy. No adnexal masses. Other: Trace upper abdominal ascites again noted. No free intra-abdominal gas. Musculoskeletal: No acute or destructive bony lesions. Reconstructed images demonstrate no additional findings. IMPRESSION: 1. Dilated hyperenhancing loops of proximal small bowel, consistent with obstruction. Transition point at the site of prior enterocutaneous fistula with multiple adhesions in the region of the umbilicus. 2. Multilocular fluid collections in the subcutaneous tissues of the anterior abdominal wall, at site of previous enterocutaneous fistula. Differential diagnosis includes recurrent fistula versus subcutaneous abscesses. Evaluation limited without oral contrast. 3. Trace ascites, stable. 4. Stable postsurgical dilatation of the common bile duct after cholecystectomy. Electronically Signed   By: Randa Ngo M.D.   On: 08/01/2021 23:53    EKG: None obtained in ED.   Assessment/Plan SBO Recurrent cutaneous abscesses     - admitted to inpt, tele     - OB-GYN, IR, General Surgery consulted; plan per them, NGT ordered, SBO protocol ordered     - NPO for now     - pain control, anti-emetics  Moderate protein-calorie malnutrition     - dietician consult w/ she comes of NPO status  Hypokalemia Hypomagnesemia Hyponatremia     - replace K+, Mg2+     - Na+ is mildly low; continue fluids  GERD     - PPI  Normocytic anemia     - no evidence of bleed, check iron studies  DVT prophylaxis: SCDs  Code Status: FULL  Family Communication: None at bedside  Consults called: OB-Gyn (Dr. Berline Lopes), IR, [facility] (Dr. Johney Maine)   Status is: Inpatient  Remains inpatient appropriate because: severity of illness  Letesha Klecker A Marylyn Ishihara DO [facility]  If 7PM-7AM, please contact night-coverage [email or web address]  08/02/2021, 9:45 AM

## 2021-08-02 NOTE — ED Notes (Signed)
Floor called for receiving RN.

## 2021-08-02 NOTE — Progress Notes (Signed)
GYN Oncology Progress Note  Sarah Newman is a 45 year old female currently admitted with a bowel obstruction and multilocular fluid collections in the subcutaneous space of the anterior abdominal wall. 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 The fistula was managed with bowel rest and TPN at that time. Following this, she has had recollection of a subcutaneous abscess requiring drainage.  Patient is alert, oriented, resting in bed with lights off. No nausea or emesis reported since NG tube placement. She reports her pain as mild but gives RN a rating of 7 on pain scale. She has tenderness in her upper abdomen. States prior to admission, she was having small bowel movements every day. She has struggled with decreased appetite, nausea, and pain in in the upper abdomen.  Dr. Berline Lopes discussed the plan with the patient. IR has been consulted for drainage of the fluid collection in the subcutaneous abdominal tissue along with a biopsy of the increasing soft tissue in this area as well. This was discussed with Dr. Vernard Gambles in IR and Dr. Johney Maine with [facility]. A CT entero study will be performed as well to further evaluate the bowels. Dr. Berline Lopes recommends waiting to start antibiotics until drainage of the fluid collection for culture. Continue plan of care per Hospitalist Team, bowel rest.

## 2021-08-02 NOTE — Consult Note (Signed)
Gynecologic Oncology Consultation  Sarah Newman 45 y.o. female  CC:  Chief Complaint  Patient presents with   Abdominal Pain   Treatment History:     Oncology History Overview Note   Clear cell features Negative genetics    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[place])   07/02/2019 Imaging     Ct scan of abdomen and pelvis 1. There is a 16 cm complex cystic mass in the pelvis containing thickened septations located near midline, abutting the superior aspect of the uterus. I suspect this mass is adnexal/ovarian in origin rather than uterine in origin. Specifically, I suspect the mass likely arises from the left ovary/adnexa and is most consistent with a neoplasm. Malignancy is certainly not excluded on this study. Recommend a pelvic ultrasound for further evaluation. 2. The rounded more solid-appearing structure in the right side of the pelvis is probably the right ovary. Recommend attention on ultrasound. 3. The small amount of fluid in the pelvis is likely reactive to the complex cystic mass likely rising from the left ovary/adnexa. 4. No cause for blood in vomit or black stools identified. No convincing evidence of a perforated or inflamed gastric or duodenal ulcer. 5. Hepatic steatosis.     07/02/2019 Imaging     MRI pelvis 1. 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 There are 2 enhancing nodules within the central upper abdomen raising the possibility of peritoneal carcinomatosis. Additionally, there is a small amount fluid within the pelvis. Malignant ascites not excluded. 2. Fibroid uterus. 3. Hepatic steatosis.     07/02/2019 Imaging     US pelvis 1. There is a large mass in the pelvis also seen on CT imaging. A separate left ovary is not visualized. 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 The mass is suspicious for malignancy. Recommend  gynecologic consultation. 2. The right ovary demonstrates an irregular ill-defined hypoechoic hypervascular region centrally. The findings are nonspecific in the right ovary. However, given the apparent left ovarian neoplasm suspicious for malignancy, recommend an MRI to further assess the right ovary. 3. Uterine fibroid measuring 3.6 cm.       07/21/2019 Pathology Results     FINAL MICROSCOPIC DIAGNOSIS: A. 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 - See oncology table. B. OMENTUM, RESECTION: - Omental lymph node with metastatic carcinoma (1/1). - Omental adipose tissue with foci of inflammation and reactive mesothelial changes. C. FALLOPIAN TUBE, LEFT, RESECTION: - Fallopian tube with focal, mild inflammation and fibrosis. - No tumor identified. D. UTERUS, CERVIX, RIGHT TUBE AND OVARY: - Cervix Nabothian cyst and squamous metaplasia. - Endometrium Proliferative. No hyperplasia or carcinoma. - Myometrium Leiomyomata. No malignancy. -Right ovary Poorly differentiated carcinoma, 4.8 cm. See oncology table and comment. -Right Fallopian tube Benign paratubal cyst. No endometriosis or malignancy. ONCOLOGY TABLE: OVARY or FALLOPIAN TUBE or PRIMARY PERITONEUM: Procedure: Bilateral f-oophorectomy, hysterectomy and omentectomy. Specimen Integrity: Intact Tumor Site: Left ovary and right ovary. See comment. Ovarian Surface Involvement: Not identified. Fallopian Tube Surface Involvement: Not identified. Tumor Size: Left ovary: 18.6 x 16.0 x 7.2 cm. Right ovary: 4.8 x 3.2 x 3.2 cm. Histologic Type: Clear cell carcinoma. See comment. Histologic Grade: High-grade. Other Tissue/ Organ Involvement: Omental lymph node with metastatic carcinoma. Peritoneal/Ascitic Fluid: Pending. Treatment Effect: No known presurgical therapy. Pathologic Stage Classification (pTNM, AJCC 8th Edition): pT3a, pN1a. Representative Tumor Block: A2,  A3, A4, A5 A6, A7 and  A8. Comment(s): The left ovarian tumor is 18.6 cm in greatest dimension and has features of clear cell carcinoma. The right ovarian tumor is 4.8 cm in greatest dimension and is poorly differentiated with focal features consistent with clear cell carcinoma. The pattern of involvement suggests that the right ovarian tumor is likely metastatic from the larger left ovarian clear cell carcinoma. Sections of the omentum show a lymph node with metastatic carcinoma. Sections of the remainder of the omentum do not show involvement by carcinoma.     07/21/2019 Surgery     Procedure(s) Performed: Exploratory laparotomy with radical tumor debulking including total hysterectomy, bilateral salpingo-oophorectomy, infra-gastric omentectomy, and washings.   Surgeon: Jeral Pinch, MD    Operative Findings: On EUA, mobile uterus and ovarian mass, situated out of the pelvis, spanning 6-8 cm above the umbilicus.  On intra-abdominal entry, inflamed omentum adherent to much of the 16 cm left ovarian mass, adhesions easily lysed bluntly.  No nodularity of the ovarian mass however on delivery of the mass through the midline incision, there was Intra-Op rupture of one of the smaller cystic components with drainage of clear brown-tinged fluid.  Grossly normal-appearing left fallopian tube as well as right tube.  Right ovary mildly enlarged measuring approximately 4 cm and firm/nodular, suspicious for tumor involvement.  Uterus 8-10 cm with 4 cm anterior mid body fibroid.  Some very small volume miliary disease along right pelvic sidewall, removed with uterine specimen.  Evidence of small diverticular disease.  Small bowel normal appearing although multiple loops of bowel adherent to each other, especially by mesentery, and an inflammatory manner.  An approximately 2 x 2 centimeter nodule suspicious for tumor implant was found in the omentum just inferior to the greater curvature of the stomach.  Additional small (less than 5 mm)  implants noted on the stomach and posterior aspect of the left lobe of the liver.  Otherwise the liver surface and diaphragm were smooth.     08/03/2019 Cancer Staging     Staging form: Ovary, Fallopian Tube, and Primary Peritoneal Carcinoma, AJCC 8th Edition - Pathologic: Stage IIIA2 (pT3a, pN1, cM0) - Signed by Heath Lark, MD on 08/03/2019       08/12/2019 Procedure     Placement of single lumen port a cath via right internal jugular vein. The catheter tip lies at the cavo-atrial junction. A power injectable port a cath was placed and is ready for immediate use.     08/22/2019 -  Chemotherapy     The patient had carboplatin and taxol for chemotherapy treatment.        09/23/2019 Genetic Testing     Negative genetic testing:  No pathogenic variants detected on the [facility] TumorNext-HRD+CancerNext paired germline and somatic genetic test. The report date is 09/23/2019.   The TumorNext-HRD+CancerNext test offered by [facility] includes paired germline and tumor analyses of 11 genes associated with homologous recombination repair (ATM, BARD1, BRIP1, CHEK2, MRE11A, NBN, PALB2, RAD51C, RAD51D, BRCA1, BRCA2) plus germline analyses of 26 additional genes associated with hereditary cancer (APC, AXIN2, BMPR1A, CDH1, CDK4, CDKN2A, DICER1, HOXB13, EPCAM, GREM1, MLH1, MSH2, MSH3, MSH6, MUTYH, NF1, NTHL1, PMS2, POLD1, POLE, PTEN, RECQL, SMAD4, SMARCA4, STK11, and TP53).     11/14/2019 Tumor Marker     Patient's tumor was tested for the following markers: CA-125 Results of the tumor marker test revealed 10.2.     12/05/2019 Tumor Marker     Patient's tumor was tested for the following markers:  CA-125 Results of the tumor marker test revealed 9.9     01/05/2020 Imaging     No specific findings of active malignancy. Interval resection of the pelvic mass. Subtle nodularity along the right side of the vaginal cuff merits surveillance.       01/05/2020 Tumor Marker     Patient's tumor was tested for the following  markers: CA-125 Results of the tumor marker test revealed 9.7     02/17/2020 Tumor Marker     Patient's tumor was tested for the following markers: CA-125 Results of the tumor marker test revealed 9.6     04/05/2020 Imaging     1. There is some minimal stranding along the mesentery and omentum without overt omental caking or well-defined nodularity. Faint bandlike density along the anterior peritoneal reflection in the pelvis, slightly more notable than on prior. Overall the appearance is not considered specific for recurrence but given the slight increase in prominence from 01/05/2020, would encourage careful surveillance by tumor markers and imaging. 2. Mild lower lumbar degenerative facet arthropathy.     04/05/2020 Tumor Marker     Patient's tumor was tested for the following markers: CA-125 Results of the tumor marker test revealed 11     05/29/2020 Tumor Marker     Patient's tumor was tested for the following markers: CA-125. Results of the tumor marker test revealed 17.     07/04/2020 Tumor Marker     Patient's tumor was tested for the following markers: CA-125. Results of the tumor marker test revealed 20.1     07/16/2020 Imaging     Stable mild stranding throughout mesenteric and omental fat, without evidence of discrete masses or ascites. No evidence of new or progressive disease.     08/29/2020 Tumor Marker     Patient's tumor was tested for the following markers: CA-125 Results of the tumor marker test revealed 32.7.     09/07/2020 Imaging     1. Interval progression of peritoneal disease within the abdomen and pelvis. Tumor predominantly involves the serosal surface of the large and small bowel loops. No signs of bowel obstruction identified at this time. 2. No evidence for metastatic disease to the chest.     09/19/2020 Imaging     1. No acute findings identified within the abdomen or pelvis. No evidence for bowel obstruction. 2. Similar appearance of soft tissue thickening  along the peritoneal reflections within the abdomen and pelvis compatible with known peritoneal disease. Recently characterized increased soft tissue along the surface of the jejunal bowel loops and tethering of pelvic small bowel loops is difficult to quantify (and compared with previous exam) due to lack of IV contrast material.       09/20/2020 -  Chemotherapy      Patient is on Treatment Plan: OVARIAN RECURRENT 3RD LINE CARBOPLATIN D1 / GEMCITABINE D1,8 (4/800) Q21D   Carboplatin was discontinued after 10/15/2020 due to allergic reaction         10/01/2020 Tumor Marker     Patient's tumor was tested for the following markers: CA-125. Results of the tumor marker test revealed 37.2.     10/15/2020 Tumor Marker     Patient's tumor was tested for the following markers: CA-125. Results of the tumor marker test revealed 48     10/22/2020 Tumor Marker     Patient's tumor was tested for the following markers: CA-125 Results of the tumor marker test revealed 28.5     11/05/2020 Tumor Marker  Patient's tumor was tested for the following markers: CA-125 Results of the tumor marker test revealed 18.9     12/17/2020 Imaging     1. No significant change in soft tissue thickening and nodularity in the low pelvis, with minimal thickening of the peritoneum throughout the abdomen. 2. Similar appearance of soft tissue thickening about the mid small bowel in the central, ventral abdomen. 3. Findings are consistent with stable peritoneal metastatic disease. 4. Status post hysterectomy and cholecystectomy.     12/18/2020 Tumor Marker     Patient's tumor was tested for the following markers: CA-125 Results of the tumor marker test revealed 10.8     01/11/2021 Imaging     Findings concerning for small bowel obstruction.       01/11/2021 Tumor Marker     Patient's tumor was tested for the following markers: CA-125. Results of the tumor marker test revealed 10.4.     01/11/2021 Imaging     CT abdomen  and pelvis Mildly prominent proximal loops of small bowel although no true transition zone is seen in the distal small bowel appears within normal limits. These changes could represent early partial small bowel obstruction. The overall appearance however is similar to that seen on prior CT from May of 2022.   Mild peritoneal thickening similar to that seen on the prior exam.   Fatty liver.   No other focal abnormality is noted.   Since this time, she has had multiple CT scans with the last on 07/08/2021 prior to this hospital stay.  CA 125 on 07/08/2021 at 42.7 from 35.0.  HPI/Interval History:  Sarah Newman is a 45 year old female currently admitted with a bowel obstruction and multilocular fluid collections in the subcutaneous space of the anterior abdominal wall. 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 The fistula was managed with bowel rest and TPN at that time. Following this, she has had recollection of a subcutaneous abscess requiring drainage.   Patient is alert, oriented, resting in bed with lights off. No nausea or emesis reported since NG tube placement. She reports her pain as mild but gives RN a rating of 7 on pain scale. She has tenderness in her upper abdomen. States prior to admission, she was having small bowel movements every day. She has struggled with decreased appetite, nausea, and pain in in the upper abdomen. No fever or chills at home.    Review of Systems: Constitutional:  Positive for decreased appetite, unintentional weight loss.  HENT:  Negative for congestion.   Respiratory:  Negative for  shortness of breath.   Cardiovascular:  Negative for chest pain.  Gastrointestinal:  Positive for abdominal distention in the upper abdomen, abdominal pain, nausea.  Genitourinary:  Negative for flank pain.  Musculoskeletal:  Negative for back pain.  Skin:  Negative for rash.  Neurological:  Positive for generalized weakness.  Psychiatric/Behavioral:  Negative for new headaches, confusion, lightheadedness.    Current Meds: Current ordered meds reviewed   Allergy:  Allergies  Allergen Reactions   Carboplatin Shortness Of Breath    Social Hx:   Social History   Socioeconomic History   Marital status: Married    Spouse name:  Robert   Number of children: 2   Years of education: Not on file   Highest education level: Not on file  Occupational History   Not on file  Tobacco Use   Smoking status: Never   Smokeless tobacco: Never  Vaping Use   Vaping Use: Never used  Substance and Sexual Activity   Alcohol use: Not Currently    Comment: Social drinker   Drug use: Never   Sexual activity: Yes    Birth control/protection: Surgical  Other Topics Concern   Not on file  Social History Narrative   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  Newly married as of October 2020.   2 boys, age 77 and 51   Social Determinants of Health   Financial Resource Strain: Not on file  Food Insecurity: Not on file  Transportation Needs: Not on file  Physical Activity: Not on file  Stress: Not on file  Social Connections: Not on file  Intimate Partner Violence: Not on file    Past Surgical Hx:  Past Surgical History:  Procedure Laterality Date   CHOLECYSTECTOMY N/A 02/16/2015   Procedure: LAPAROSCOPIC CHOLECYSTECTOMY WITH INTRAOPERATIVE CHOLANGIOGRAM;  Surgeon: Johnathan Hausen, MD;  Location: [facility];  Service: General;  Laterality: N/A;   INCISION AND DRAINAGE ABSCESS N/A 02/02/2021   Procedure: INCISION AND DRAINAGE ABSCESS;  Surgeon: Lahoma Crocker, MD;  Location: [facility];   Service: Gynecology;  Laterality: N/A;   IR IMAGING GUIDED PORT INSERTION  08/12/2019   IR REMOVAL TUN ACCESS W/ PORT W/O FL MOD SED  03/18/2021   LAPAROSCOPY N/A 01/23/2021   Procedure: LAPAROSCOPY DIAGNOSTIC;  Surgeon: Lafonda Mosses, MD;  Location[facility];  Service: Gynecology;  Laterality: N/A;   LAPAROTOMY N/A 01/23/2021   Procedure: LAPAROTOMY, ENTEROTOMY REPAIR OF THE BOWEL;  Surgeon: Lafonda Mosses, MD;  Location: [facility];  Service: Gynecology;  Laterality: N/A;   LAPAROTOMY N/A 05/03/2021   Procedure: incision and drainage abdominal collection and placement drainage catheter;  Surgeon: Lafonda Mosses, MD;  Location: [facility];  Service: Gynecology;  Laterality: N/A;   SALPINGOOPHORECTOMY N/A 07/21/2019   Procedure: EXPLORATORY LAPAROTOMY, TOTAL ABDOMINAL HYSTERECTOMY, BILATERAL SALPINGO OOPHORECTOMY, OMENTECTOMY;  Surgeon: Lafonda Mosses, MD;  Location: [facility];  Service: Gynecology;  Laterality: N/A;   UPPER GASTROINTESTINAL ENDOSCOPY  08/01/2016   by dr Corinda Gubler in [place] N/A 03/11/2021   Procedure: OPENING AND EXPLORATION OF ABDOMINAL EXCISION;  Surgeon: Lafonda Mosses, MD;  Location: [facility];  Service: Gynecology;  Laterality: N/A;    Past Medical Hx:  Past Medical History:  Diagnosis Date   Anemia due to antineoplastic chemotherapy    Arthritis    Asthma due to seasonal allergies    01-21-2021 per pt seasonal and exercised induced, does not have inhaler, last used one few years   Chronic abdominal pain 01/2021   Chronic nausea    01-21-2021  per pt intermittant nausea and when has abd pain most of time will have vomiting   Family history of breast cancer    Family history of colon cancer    Family history of ovarian cancer    Family history of prostate cancer    History of 2019 novel coronavirus disease (COVID-19) 09/08/2020   positive results in epic, per pt mild symptoms that  resolved   History of Helicobacter pylori infection 07/2016   E[facility] w/ bx's by [facility],  chronic h.pylori w/ gastritis,  treated  (no ulcer)   Malignant neoplasm of left ovary [facility]) 07/2019   oncologist--- dr gorsuch/ dr Berline Lopes--- 07-21-2019 s/p exp. 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;  progression w/ abdominal carcinomatosis   On total parenteral nutrition (TPN) 02/02/2021   infused thru PAC   Port-A-Cath in place    Steroid-induced diabetes ([place])    followed by dr Alvy Bimler    Family Hx:  Family History  Problem Relation Age of Onset   Colon cancer Father 11   Prostate cancer Father 9   Colon cancer Paternal Aunt        dx. early 60s   Ovarian cancer Paternal Grandmother 35   Breast cancer Cousin    Endometriosis Mother    Cancer Paternal Uncle        unknown type, dx. early 31s   Cancer Paternal Uncle 55       unknown type   Esophageal cancer Neg Hx    CT AP on 08/01/2021: 1. Dilated hyperenhancing loops of proximal small bowel, consistent with obstruction. Transition point at the site of prior enterocutaneous fistula with multiple adhesions in the region of the umbilicus. 2. Multilocular fluid collections in the subcutaneous tissues of the anterior abdominal wall, at site of previous enterocutaneous fistula. Differential diagnosis includes recurrent fistula versus subcutaneous abscesses. Evaluation limited without oral contrast. 3. Trace ascites, stable. 4. Stable postsurgical dilatation of the common bile duct after cholecystectomy.  Vitals:  Blood pressure 97/67, pulse 80, temperature 97.8 F (36.6 C), temperature source Oral, resp. rate 18, height _0  (1.575 m), weight 160 lb (72.6 kg), last menstrual period 07/05/2019, SpO2 99 %.  Physical Exam:  General: Well developed but thin with weight loss apparent with physical appearance, female in no acute distress. Alert and oriented x 3.  Cardiovascular: Regular rate and rhythm. S1 and S2  normal.  Lungs: Clear to auscultation bilaterally. No wheezes/crackles/rhonchi noted.  Abdomen: Abdomen soft, tender in upper abdomen. Diffusely, mildly warm but no overly on the bulbous 4x4 cm area above the umbilicus. Hypoactive bowel sounds but no tickling bowel sounds. Palpable mass-like area noted in the upper abdomen above the umbilicus-larger from previous exams. Extremities: No bilateral cyanosis, edema, or clubbing.    Assessment/Plan:  Dr. Berline Lopes discussed the plan with the patient. IR has been consulted for drainage of the fluid collection in the subcutaneous abdominal tissue along with a biopsy of the increasing soft tissue in this area as well. This was discussed with Dr. Vernard Gambles in IR and Dr. Johney Maine with [facility]. A CT entero study will be performed as well to further evaluate the bowels. Dr. Berline Lopes recommends waiting to start antibiotics until drainage of the fluid collection for culture. Continue plan of care per Hospitalist Team, bowel rest.  [name], NP 08/02/2021, 2:01 PM

## 2021-08-02 NOTE — Progress Notes (Signed)
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[place]) I have reviewed multiple imaging studies  Biopsy is pending  Small bowel obstruction Her examination is abnormal compared to my previous exam 2 weeks ago Agree with conservative approach with NG tube placement, IV fluids and electrolyte replacement  Severe abdominal pain Related to SBO I will adjust the dose of her Dilaudid   Nausea and vomiting She is on antiemetics  Electrolyte imbalances She is receiving electrolyte replacement therapy   Weight loss, non-intentional She has progressive weight loss due to difficulties maintaining oral intake since discontinuation of TPN Continue n.p.o. for now  Discharge planning She would likely be here for the next 3 to 5 days Please call consult service if questions arise I will return to check on her on Tuesday. She has appointment to see me on January 13  All questions were answered. The patient knows to call the clinic with any problems, questions or concerns.   The total time spent in the appointment was 40 minutes encounter with patients including review of chart and various tests results, discussions about plan of care and coordination of care plan  Heath Lark, MD 08/02/2021 4:32 PM  Subjective:  Patient is well-known to me I just saw her several weeks ago for follow-up on history of recurrent ovarian cancer.  At that point in time, she struggled with eating but her abdomen is soft.  She called yesterday complaining of severe waves of abdominal pain and nausea.  Bowel obstruction is suspected and she presented to the emergency department for evaluation.  Repeat imaging study confirmed bowel obstruction and NG tube is placed.  She is receiving IV fluids and supportive care.  Interventional radiologist had order biopsy. She is complaining of poorly controlled pain.  She has been receiving intermittent doses of 0.5 mg of  Dilaudid.  Her last bowel movement was on Monday.  She is passing flatus  Objective:  Vitals:   08/02/21 1354 08/02/21 1437  BP: 97/67 105/78  Pulse:  82  Resp:  18  Temp:  98.2 F (36.8 C)  SpO2: 99% 100%     Intake/Output Summary (Last 24 hours) at 08/02/2021 1632 Last data filed at 08/02/2021 1240 Gross per 24 hour  Intake 1630.81 ml  Output --  Net 1630.81 ml    GENERAL:alert, no distress and comfortable.  NG in situ ABDOMEN:abdomen appears distended with tense abdomen at prior surgical site NEURO: alert & oriented x 3 with fluent speech, no focal motor/sensory deficits   Labs:  Recent Labs    07/08/21 1615 08/01/21 2137 08/02/21 0524  NA 143 134* 131*  K 3.2* 4.0 3.3*  CL 107 99 98  CO2 24 20* 23  GLUCOSE 83 92 116*  BUN 8 11 9   CREATININE 0.71 0.43* 0.42*  CALCIUM 9.1 9.0 8.5*  GFRNONAA >60 >60 >60  PROT 7.1 6.9 6.4*  ALBUMIN 3.6 3.4* 3.1*  AST 16 18 13*  ALT 10 13 13   ALKPHOS 88 63 60  BILITOT 0.4 1.2 1.0    Studies: I have personally reviewed the CT imaging DG Abd 1 View  Result Date: 08/02/2021 CLINICAL DATA:  Nasogastric tube placement. EXAM: ABDOMEN - 1 VIEW COMPARISON:  Abdominal plain film dated May 04, 2021 and abdomen and pelvis CT dated August 01, 2021 FINDINGS: A nasogastric tube is seen with its distal tip looped within the gastric antrum. Dilated small bowel loops are seen  within the mid to upper abdomen. Radiopaque contrast is seen within the urinary bladder and bilateral renal collecting systems. Radiopaque surgical clips are noted within the right upper quadrant. No radio-opaque calculi or other significant radiographic abnormality are seen. IMPRESSION: Nasogastric tube positioning, as described above. Electronically Signed   By: Virgina Norfolk M.D.   On: 08/02/2021 02:57   CT ABDOMEN PELVIS W CONTRAST  Result Date: 08/01/2021 CLINICAL DATA:  Bowel obstruction, history of prior fistula EXAM: CT ABDOMEN AND PELVIS WITH CONTRAST  TECHNIQUE: Multidetector CT imaging of the abdomen and pelvis was performed using the standard protocol following bolus administration of intravenous contrast. CONTRAST:  169mL OMNIPAQUE IOHEXOL 350 MG/ML SOLN COMPARISON:  07/08/2021 FINDINGS: Lower chest: No acute pleural or parenchymal lung disease. Hepatobiliary: Gallbladder is surgically absent. Postsurgical dilatation of the common bile duct measuring up to 11 mm. No focal liver abnormality. Pancreas: Unremarkable. No pancreatic ductal dilatation or surrounding inflammatory changes. Spleen: Normal in size without focal abnormality. Adrenals/Urinary Tract: Kidneys enhance normally and symmetrically. The adrenals are unremarkable. Bladder is completely decompressed, limiting its evaluation. Stomach/Bowel: There is dilation and hyperenhancement of the mucosa of the duodenum and proximal jejunum, measuring up to 3.7 cm in maximal diameter. The distal small bowel and colon are relatively decompressed. Transition point is in the region of prior abdominal wall fistula, consistent with adhesions. Multilocular fluid collections are seen within the anterior abdominal wall at the site of previous enterocutaneous fistula, measuring up to 2.0 cm. It is unclear whether these reflect recurrent fistula and bowel contents, evaluation limited without oral contrast. Multiple subcutaneous abscesses could also give this appearance. Vascular/Lymphatic: No significant vascular findings are present. No enlarged abdominal or pelvic lymph nodes. Reproductive: Status post hysterectomy. No adnexal masses. Other: Trace upper abdominal ascites again noted. No free intra-abdominal gas. Musculoskeletal: No acute or destructive bony lesions. Reconstructed images demonstrate no additional findings. IMPRESSION: 1. Dilated hyperenhancing loops of proximal small bowel, consistent with obstruction. 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 2. Multilocular fluid collections in the subcutaneous tissues of the anterior abdominal wall, at site of previous enterocutaneous fistula. Differential diagnosis includes recurrent fistula versus subcutaneous abscesses. Evaluation limited without oral contrast. 3. Trace ascites, stable. 4. Stable postsurgical dilatation of the common bile duct after cholecystectomy. Electronically Signed   By: Randa Ngo M.D.   On: 08/01/2021 23:53   CT Abdomen Pelvis W Contrast  Result Date: 07/08/2021 CLINICAL DATA:  Persistent postprocedural fistula with new abdominal swelling, pain and firmness around the previous fistula drainage site. EXAM: CT ABDOMEN AND PELVIS WITH CONTRAST TECHNIQUE: Multidetector CT imaging of the abdomen and pelvis was performed using the standard protocol following bolus administration of intravenous contrast. CONTRAST:  11mL OMNIPAQUE IOHEXOL 350 MG/ML SOLN COMPARISON:  Multiple priors including most recent CT May 31, 2021 FINDINGS: Lower chest: No acute abnormality. Hepatobiliary: No suspicious hepatic lesion. Gallbladder surgically absent. Similar mild dilation of the biliary tree post cholecystectomy. Pancreas: No pancreatic ductal dilation or evidence of acute inflammation. Spleen: Normal in size without focal abnormality. Adrenals/Urinary Tract: Bilateral adrenal glands are unremarkable. No hydronephrosis. No solid enhancing renal mass. Urinary bladder is unremarkable for degree of distension. Stomach/Bowel: No enteric contrast was administered. Stomach is unremarkable for degree of distension. No pathologic dilation of large or small bowel. Similar appearance of the prominent gas and fluid-filled loops of small bowel in the right hemiabdomen without discrete transition, additionally there is similar wall thickening  of these loops of small bowel. There is mixed density fluid and nodularity in the anterior abdomen with multiple loops of bowel traversing this area,  however delineation of these bowel loops is difficult without intraluminal contrast material. No walled off fluid collection. Vascular/Lymphatic: No abdominal aortic aneurysm. No pathologically enlarged abdominal or pelvic lymph nodes. Reproductive: Uterus is surgically absent. No suspicious adnexal mass. Other: Increased small volume of abdominopelvic free fluid soft tissue nodularity in the anterior abdomen along the prior fistulous tract appears similar to prior. No new walled off fluid collection. Musculoskeletal: No aggressive lytic or blastic lesion of bone. No acute osseous abnormality. IMPRESSION: 1. No significant change in appearance of the soft tissue nodularity in the anterior abdominal wall in the region of the prior fistulous tract, no new walled off fluid collection. 2. Similar appearance of the mixed density nodular area in the anterior abdomen, possibly reflecting loops of bowel, scarring and ascites however this area is difficult to delineate given the lack of oral contrast material. No intra abdominopelvic walled off fluid collection. 3. Increased small volume of abdominopelvic free fluid with similar peritoneal thickening. 4. Similar wall thickening of the prominent gas and fluid-filled loops of small bowel in the right hemiabdomen without evidence of high-grade obstruction. Electronically Signed   By: Dahlia Bailiff M.D.   On: 07/08/2021 18:53   DG Abd Portable 1V-Small Bowel Protocol-Position Verification  Result Date: 08/02/2021 CLINICAL DATA:  NG tube placement. EXAM: PORTABLE ABDOMEN - 1 VIEW COMPARISON:  Abdominal x-ray 08/02/2021. FINDINGS: Nasogastric tube tip is at the level of the gastroesophageal junction. The distal 2.5 cm of the catheter is folded upon itself. No dilated bowel loops are seen. Cholecystectomy clips are present. Visualized lung bases are clear. No fractures are identified. IMPRESSION: 1. Enteric tube tip is at the level of the gastroesophageal junction in the  distal portion of the tip is folded upon itself. Recommend repositioning. Electronically Signed   By: Ronney Asters M.D.   On: 08/02/2021 15:39   DG Abd Portable 1 View  Result Date: 08/02/2021 CLINICAL DATA:  Nasogastric tube placement. EXAM: PORTABLE ABDOMEN - 1 VIEW COMPARISON:  Same day. FINDINGS: Nasogastric tube tip is seen in expected position of proximal stomach. No abnormal bowel dilatation is noted. IMPRESSION: Nasogastric tube tip seen in expected position of proximal stomach. Electronically Signed   By: Marijo Conception M.D.   On: 08/02/2021 09:48   CT ABDOMINAL MASS BIOPSY  Result Date: 08/02/2021 CLINICAL DATA:  hx ovarian CA and enterocutaneous fistula, currently hospitalized due to SBO. CT on 12/29 showed multilocular fluid collections in the subcutaneous tissues of the anterior abdominal wall. EXAM: CT GUIDED NEEDLE ASPIRATE AND CORE BIOPSY OF ANTERIOR ABDOMINAL WALL COMPLEX MASS ANESTHESIA/SEDATION: Intravenous Fentanyl 44mcg and Versed 2mg  were administered as conscious sedation during continuous monitoring of the patient's level of consciousness and physiological / cardiorespiratory status by the radiology RN, with a total moderate sedation time of 10 minutes. PROCEDURE: The procedure risks, benefits, and alternatives were explained to the patient. Questions regarding the procedure were encouraged and answered. The patient understands and consents to the procedure. Select axial scans through the mid abdomen obtained in the complex anterior abdominal process was localized. An appropriate skin site was determined and marked. The operative field was prepped with chlorhexidinein a sterile fashion, and a sterile drape was applied covering the operative field. A sterile gown and sterile gloves were used for the procedure. Local anesthesia was provided with 1% Lidocaine. Under CT fluoroscopic  guidance, a 17 gauge trocar needle was advanced into the lesion. Approximately 4 mL of viscous  purulent-appearing material were aspirated, sent for Gram stain and culture. The needle was positioned at the margin of the lesion. Once needle tip position was confirmed, coaxial 18-gauge core biopsy samples were obtained, obtaining 4 solid-appearing cores submitted in formalin to surgical pathology. The guide needle was removed. Postprocedure scans show no hemorrhage or other apparent complication. The patient tolerated the procedure well. COMPLICATIONS: None immediate FINDINGS: Complex lobular anterior abdominal wall process was localized. 4 mL purulent-appearing material aspirated from the lesion. Multiple core biopsy samples were obtained. IMPRESSION: 1. Technically successful CT-guided aspiration and biopsy of anterior abdominal wall process. Electronically Signed   By: Lucrezia Europe M.D.   On: 08/02/2021 14:47

## 2021-08-02 NOTE — Consult Note (Signed)
Chief Complaint: Patient was seen in consultation today for anterior abdominal wall mass/fluid collection  Chief Complaint  Patient presents with   Abdominal Pain   at the request of Joylene John  NP  Referring Physician(s): Joylene John  NP  Supervising Physician: Arne Cleveland  Patient Status: [facility] - ED  History of Present Illness: Sarah Newman is a 45 y.o. 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  CT AP w contrast showed:  1. Dilated hyperenhancing loops of proximal small bowel, consistent with obstruction. Transition point at the site of prior enterocutaneous fistula with multiple adhesions in the region of the umbilicus. 2. Multilocular fluid collections in the subcutaneous tissues of the anterior abdominal wall, at site of previous enterocutaneous fistula. Differential diagnosis includes recurrent fistula versus subcutaneous abscesses. Evaluation limited without oral contrast. 3. Trace ascites, stable. 4. Stable postsurgical dilatation of the common bile duct after cholecystectomy.  Patient is being hospitalized for further evaluation and management, and GYN was consulted. Dr. Vernard Gambles was contacted by GYN to discuss the management of the anterior abdominal wall fluid collection/mass, a CT guided biopsy and/or aspiration with possible drain placement was approved.   Patient seen in [facility] ED. Patient sittig in bed, not in acute distress. RN at bedside.  She states that her anterior abdominal wall fistula came back when she started eating again, which has to be drained. She noticed swelling on her abdomen where she used to have fluid collection, states that [place] was obtained and it showed the swelling was more of a solid mass rather than a fluid collection.  She reports N/V and generalized  abdominal pain this morning.  Denise headache, fever, chills, shortness of breath, cough, chest pain, and bleeding.  Past Medical History:  Diagnosis Date   Anemia due to antineoplastic chemotherapy    Arthritis    Asthma due to seasonal allergies    01-21-2021 per pt seasonal and exercised induced, does not have inhaler, last used one few years   Chronic abdominal pain 01/2021   Chronic nausea    01-21-2021  per pt intermittant nausea and when has abd pain most of time will have vomiting   Family history of breast cancer    Family history of colon cancer    Family history of ovarian cancer    Family history of prostate cancer    History of 2019 novel coronavirus disease (COVID-19) 09/08/2020   positive results in epic, per pt mild symptoms that resolved   History of Helicobacter pylori infection 07/2016   E[facility] w/ bx's [facility],  chronic h.pylori w/ gastritis,  treated  (no ulcer)   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[place]) 07/2019   oncologist--- dr gorsuch/ dr Berline Lopes--- 07-21-2019 s/p exp. 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;  progression w/ abdominal carcinomatosis   On total parenteral nutrition (TPN) 02/02/2021   infused thru [facility] in [facility]    Steroid-induced diabetes ([place])    followed by dr Alvy Bimler    Past Surgical History:  Procedure Laterality Date   CHOLECYSTECTOMY N/A 02/16/2015   Procedure: LAPAROSCOPIC CHOLECYSTECTOMY WITH INTRAOPERATIVE CHOLANGIOGRAM;  Surgeon: Johnathan Hausen, MD;  Location: [facility];  Service: General;  Laterality: N/A;   INCISION AND DRAINAGE ABSCESS N/A 02/02/2021   Procedure: INCISION AND DRAINAGE ABSCESS;  Surgeon: Lahoma Crocker, MD;  Location: [facility];  Service: Gynecology;  Laterality: N/A;   IR IMAGING GUIDED PORT INSERTION  08/12/2019   IR REMOVAL TUN ACCESS W/ PORT W/O FL MOD SED  03/18/2021   LAPAROSCOPY N/A 01/23/2021   Procedure: LAPAROSCOPY DIAGNOSTIC;  Surgeon: Lafonda Mosses, MD;   Location: [facility];  Service: Gynecology;  Laterality: N/A;   LAPAROTOMY N/A 01/23/2021   Procedure: LAPAROTOMY, ENTEROTOMY REPAIR OF THE BOWEL;  Surgeon: Lafonda Mosses, MD;  Location: [facility];  Service: Gynecology;  Laterality: N/A;   LAPAROTOMY N/A 05/03/2021   Procedure: incision and drainage abdominal collection and placement drainage catheter;  Surgeon: Lafonda Mosses, MD;  Location: [facility];  Service: Gynecology;  Laterality: N/A;   SALPINGOOPHORECTOMY N/A 07/21/2019   Procedure: EXPLORATORY LAPAROTOMY, TOTAL ABDOMINAL HYSTERECTOMY, BILATERAL SALPINGO OOPHORECTOMY, OMENTECTOMY;  Surgeon: Lafonda Mosses, MD;  Location: [facility];  Service: Gynecology;  Laterality: N/A;   UPPER GASTROINTESTINAL ENDOSCOPY  08/01/2016   by dr Corinda Gubler in [place] N/A 03/11/2021   Procedure: OPENING AND EXPLORATION OF ABDOMINAL EXCISION;  Surgeon: Lafonda Mosses, MD;  Location: [facility];  Service: Gynecology;  Laterality: N/A;    Allergies: Carboplatin  Medications: Prior to Admission medications   Medication Sig Start Date End Date Taking? Authorizing Provider  acetaminophen (TYLENOL) 325 MG tablet Take 650 mg by mouth every 6 (six) hours as needed for mild pain, fever or headache.    [provider]  acetaminophen (TYLENOL) 650 MG suppository Place 1 suppository (650 mg total) rectally every 6 (six) hours as needed for moderate pain. 02/09/21   Lafonda Mosses, MD  dexamethasone (DECADRON) 4 MG tablet Take 1/2 tablet (2 mg total) by mouth 2 (two) times daily. 07/09/21   Lafonda Mosses, MD  Docusate Sodium (COLACE PO) Take by mouth as needed.    [provider]  famotidine (PEPCID) 20 MG tablet Take 1 tablet (20 mg total) by mouth daily. 06/12/21   Lafonda Mosses, MD  metoCLOPramide (REGLAN) 10 MG tablet Take 1 tablet (10 mg total) by mouth every 8 (eight) hours as needed for nausea. 06/12/21    Lafonda Mosses, MD  omeprazole (PRILOSEC) 40 MG capsule Take 1 capsule by mouth daily. 07/19/21   Heath Lark, MD  ondansetron (ZOFRAN ODT) 4 MG disintegrating tablet Take 1 tablet (4 mg total) by mouth every 4 (four) hours as needed for up to 20 doses for nausea or vomiting. 07/17/21   Joylene John D, NP  ondansetron (ZOFRAN) 8 MG tablet Take 1 tablet (8 mg total) by mouth every 8 (eight) hours as needed for nausea or vomiting. 07/17/21   Heath Lark, MD  oxyCODONE (OXY IR/ROXICODONE) 5 MG immediate release tablet Take 1 tablet (5 mg total) by mouth every 4 (four) hours as needed for severe pain. 07/26/21   Lafonda Mosses, MD  Polyethylene Glycol 3350 (MIRALAX PO) Take by mouth.    [provider]  prochlorperazine (COMPAZINE) 10 MG tablet Take 1 tablet (10 mg total) by mouth every 8 (eight) hours as needed for nausea or vomiting. 07/09/21   Lafonda Mosses, MD  promethazine (PHENERGAN) 12.5 MG suppository Place 1 suppository (12.5 mg total) rectally every 8 (eight) hours as needed for up to 30 doses for nausea or vomiting. 03/15/21   Lucrezia Starch, MD     Family History  Problem Relation Age of Onset   Colon cancer Father 12   Prostate  cancer Father 73   Colon cancer Paternal Aunt        dx. early 91s   Ovarian cancer Paternal Grandmother 11   Breast cancer Cousin    Endometriosis Mother    Cancer Paternal Uncle        unknown type, dx. early 14s   Cancer Paternal Uncle 15       unknown type   Esophageal cancer Neg Hx     Social History   Socioeconomic History   Marital status: Married    Spouse name: Herbie [place]   Number of children: 2   Years of education: Not on file   Highest education level: Not on file  Occupational History   Not on file  Tobacco Use   Smoking status: Never   Smokeless tobacco: Never  Vaping Use   Vaping Use: Never used  Substance and Sexual Activity   Alcohol use: Not Currently    Comment: Social drinker   Drug use: Never    Sexual activity: Yes    Birth control/protection: Surgical  Other Topics Concern   Not on file  Social History Narrative   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  Newly married as of October 2020.   2 boys, age 71 and 64   Social Determinants of Health   Emergency planning/management officer Strain: Not on [employer] Insecurity: Not on file  Transportation Needs: Not on file  Physical Activity: Not on file  Stress: Not on file  Social Connections: Not on file     Review of Systems: A 12 point ROS discussed and pertinent positives are indicated in the HPI above.  All other systems are negative.  Vital Signs: BP 109/84    Pulse 79    Temp 99.1 F (37.3 C) (Oral)    Resp 18    Ht 5\' 2"  (1.575 m)    Wt 160 lb (72.6 kg)    LMP 07/05/2019    SpO2 95%    BMI 29.26 kg/m    Physical Exam Vitals reviewed.  Constitutional:      General: She is not in acute distress.    Appearance: She is well-developed. She is not ill-appearing.  HENT:     Head: Normocephalic and atraumatic.     Comments: NGT presents Cardiovascular:     Rate and Rhythm: Normal rate and regular rhythm.     Heart sounds: Normal heart sounds.  Pulmonary:     Effort: Pulmonary effort is normal.     Breath sounds: Normal breath sounds.  Abdominal:     General: Abdomen is scaphoid. Bowel sounds are normal.     Palpations: Abdomen is soft.     Comments: Swelling noted on left lower abdomen  Skin:    General: Skin is warm and dry.     Coloration: Skin is not cyanotic or jaundiced.  Neurological:     Mental Status: She is alert and oriented to person, place, and time.  Psychiatric:        Mood and Affect: Mood normal.        Behavior: Behavior normal.    MD Evaluation Airway: WNL Heart: WNL Abdomen: WNL Chest/ Lungs: WNL ASA  Classification: 3 Mallampati/Airway Score: Two  Imaging: DG Abd 1 View  Result Date: 08/02/2021 CLINICAL DATA:  Nasogastric tube placement. EXAM: ABDOMEN - 1 VIEW COMPARISON:  Abdominal plain  film dated May 04, 2021 and abdomen and pelvis CT dated August 01, 2021 FINDINGS: A nasogastric tube is  seen with its distal tip looped within the gastric antrum. Dilated small bowel loops are seen within the mid to upper abdomen. Radiopaque contrast is seen within the urinary bladder and bilateral renal collecting systems. Radiopaque surgical clips are noted within the right upper quadrant. No radio-opaque calculi or other significant radiographic abnormality are seen. IMPRESSION: Nasogastric tube positioning, as described above. Electronically Signed   By: Virgina Norfolk M.D.   On: 08/02/2021 02:57   CT ABDOMEN PELVIS W CONTRAST  Result Date: 08/01/2021 CLINICAL DATA:  Bowel obstruction, history of prior fistula EXAM: CT ABDOMEN AND PELVIS WITH CONTRAST TECHNIQUE: Multidetector CT imaging of the abdomen and pelvis was performed using the standard protocol following bolus administration of intravenous contrast. CONTRAST:  155mL OMNIPAQUE IOHEXOL 350 MG/ML SOLN COMPARISON:  07/08/2021 FINDINGS: Lower chest: No acute pleural or parenchymal lung disease. Hepatobiliary: Gallbladder is surgically absent. Postsurgical dilatation of the common bile duct measuring up to 11 mm. No focal liver abnormality. Pancreas: Unremarkable. No pancreatic ductal dilatation or surrounding inflammatory changes. Spleen: Normal in size without focal abnormality. Adrenals/Urinary Tract: Kidneys enhance normally and symmetrically. The adrenals are unremarkable. Bladder is completely decompressed, limiting its evaluation. Stomach/Bowel: There is dilation and hyperenhancement of the mucosa of the duodenum and proximal jejunum, measuring up to 3.7 cm in maximal diameter. The distal small bowel and colon are relatively decompressed. Transition point is in the region of prior abdominal wall fistula, consistent with adhesions. Multilocular fluid collections are seen within the anterior abdominal wall at the site of previous  enterocutaneous fistula, measuring up to 2.0 cm. It is unclear whether these reflect recurrent fistula and bowel contents, evaluation limited without oral contrast. Multiple subcutaneous abscesses could also give this appearance. Vascular/Lymphatic: No significant vascular findings are present. No enlarged abdominal or pelvic lymph nodes. Reproductive: Status post hysterectomy. No adnexal masses. Other: Trace upper abdominal ascites again noted. No free intra-abdominal gas. Musculoskeletal: No acute or destructive bony lesions. Reconstructed images demonstrate no additional findings. IMPRESSION: 1. Dilated hyperenhancing loops of proximal small bowel, consistent with obstruction. Transition point at the site of prior enterocutaneous fistula with multiple adhesions in the region of the umbilicus. 2. Multilocular fluid collections in the subcutaneous tissues of the anterior abdominal wall, at site of previous enterocutaneous fistula. Differential diagnosis includes recurrent fistula versus subcutaneous abscesses. Evaluation limited without oral contrast. 3. Trace ascites, stable. 4. Stable postsurgical dilatation of the common bile duct after cholecystectomy. Electronically Signed   By: Randa Ngo M.D.   On: 08/01/2021 23:53   CT Abdomen Pelvis W Contrast  Result Date: 07/08/2021 CLINICAL DATA:  Persistent postprocedural fistula with new abdominal swelling, pain and firmness around the previous fistula drainage site. EXAM: CT ABDOMEN AND PELVIS WITH CONTRAST TECHNIQUE: Multidetector CT imaging of the abdomen and pelvis was performed using the standard protocol following bolus administration of intravenous contrast. CONTRAST:  47mL OMNIPAQUE IOHEXOL 350 MG/ML SOLN COMPARISON:  Multiple priors including most recent CT May 31, 2021 FINDINGS: Lower chest: No acute abnormality. Hepatobiliary: No suspicious hepatic lesion. Gallbladder surgically absent. Similar mild dilation of the biliary tree post  cholecystectomy. Pancreas: No pancreatic ductal dilation or evidence of acute inflammation. Spleen: Normal in size without focal abnormality. Adrenals/Urinary Tract: Bilateral adrenal glands are unremarkable. No hydronephrosis. No solid enhancing renal mass. Urinary bladder is unremarkable for degree of distension. Stomach/Bowel: No enteric contrast was administered. Stomach is unremarkable for degree of distension. No pathologic dilation of large or small bowel. Similar appearance of the prominent gas and fluid-filled loops  of small bowel in the right hemiabdomen without discrete transition, additionally there is similar wall thickening of these loops of small bowel. There is mixed density fluid and nodularity in the anterior abdomen with multiple loops of bowel traversing this area, however delineation of these bowel loops is difficult without intraluminal contrast material. No walled off fluid collection. Vascular/Lymphatic: No abdominal aortic aneurysm. No pathologically enlarged abdominal or pelvic lymph nodes. Reproductive: Uterus is surgically absent. No suspicious adnexal mass. Other: Increased small volume of abdominopelvic free fluid soft tissue nodularity in the anterior abdomen along the prior fistulous tract appears similar to prior. No new walled off fluid collection. Musculoskeletal: No aggressive lytic or blastic lesion of bone. No acute osseous abnormality. IMPRESSION: 1. No significant change in appearance of the soft tissue nodularity in the anterior abdominal wall in the region of the prior fistulous tract, no new walled off fluid collection. 2. Similar appearance of the mixed density nodular area in the anterior abdomen, possibly reflecting loops of bowel, scarring and ascites however this area is difficult to delineate given the lack of oral contrast material. No intra abdominopelvic walled off fluid collection. 3. Increased small volume of abdominopelvic free fluid with similar peritoneal  thickening. 4. Similar wall thickening of the prominent gas and fluid-filled loops of small bowel in the right hemiabdomen without evidence of high-grade obstruction. Electronically Signed   By: Dahlia Bailiff M.D.   On: 07/08/2021 18:53   DG Abd Portable 1 View  Result Date: 08/02/2021 CLINICAL DATA:  Nasogastric tube placement. EXAM: PORTABLE ABDOMEN - 1 VIEW COMPARISON:  Same day. FINDINGS: Nasogastric tube tip is seen in expected position of proximal stomach. No abnormal bowel dilatation is noted. IMPRESSION: Nasogastric tube tip seen in expected position of proximal stomach. Electronically Signed   By: Marijo Conception M.D.   On: 08/02/2021 09:48    Labs:  CBC: Recent Labs    06/12/21 1612 07/08/21 1615 08/01/21 2137 08/02/21 0524  WBC 5.8 5.1 11.6* 10.4  HGB 10.9* 10.1* 11.5* 10.7*  HCT 34.8* 32.3* 36.3 33.2*  PLT 266 251 276 283    COAGS: Recent Labs    02/02/21 1209 03/16/21 1042  INR 1.0 1.1  APTT 31 25    BMP: Recent Labs    06/12/21 1612 07/08/21 1615 08/01/21 2137 08/02/21 0524  NA 141 143 134* 131*  K 3.3* 3.2* 4.0 3.3*  CL 105 107 99 98  CO2 25 24 20* 23  GLUCOSE 114* 83 92 116*  BUN 7 8 11 9   CALCIUM 9.8 9.1 9.0 8.5*  CREATININE 0.72 0.71 0.43* 0.42*  GFRNONAA >60 >60 >60 >60    LIVER FUNCTION TESTS: Recent Labs    06/12/21 1612 07/08/21 1615 08/01/21 2137 08/02/21 0524  BILITOT 0.5 0.4 1.2 1.0  AST 15 16 18  13*  ALT 8 10 13 13   ALKPHOS 83 88 63 60  PROT 7.8 7.1 6.9 6.4*  ALBUMIN 4.0 3.6 3.4* 3.1*    TUMOR MARKERS: No results for input(s): AFPTM, CEA, CA199, CHROMGRNA in the last 8760 hours.  Assessment and Plan: 45 y.o. female with hx ovarian CA and enterocutaneous fistula, currently hospitalized due to SBO. CT on 12/29 showed multilocular fluid collections in the subcutaneous tissues of the anterior abdominal wall. IR was requested for aspiration and possible drain placement, case was reviewed and approved for CT guided biopsy of a  anterior abdominal wall mass and/or aspiration and possible drain placement.   NPO since Thursday due to N/V  VSS CBC stable, no leukocytosis  Not on AC/AP   Risks and benefits of abdominal wall biopsy and/or aspiration and possible drain placement  was discussed with the patient and/or patient's family including, but not limited to bleeding, infection, damage to adjacent structures or low yield requiring additional tests.  All of the questions were answered and there is agreement to proceed.  Consent signed and in IR.    Thank you for this interesting consult.  I greatly enjoyed meeting Sarah Newman and look forward to participating in their care.  A copy of this report was sent to the requesting provider on this date.  Electronically Signed: Tera Mater, PA-C 08/02/2021, 10:50 AM   I spent a total of  30 Minutes   in face to face in clinical consultation, greater than 50% of which was counseling/coordinating care for ant abd wall mass biopsy, aspiration, and possible drain placement.   This chart was dictated using voice recognition software.  Despite best efforts to proofread,  errors can occur which can change the documentation meaning.

## 2021-08-03 ENCOUNTER — Inpatient Hospital Stay (HOSPITAL_COMMUNITY): Payer: Medicaid Other

## 2021-08-03 LAB — CBC
HCT: 31.2 % — ABNORMAL LOW (ref 36.0–46.0)
HCT: 31 % — ABNORMAL LOW (ref 36.0–46.0)
Hemoglobin: 10 g/dL — ABNORMAL LOW (ref 12.0–15.0)
Hemoglobin: 10.1 g/dL — ABNORMAL LOW (ref 12.0–15.0)
MCH: 27 pg (ref 26.0–34.0)
MCH: 27.1 pg (ref 26.0–34.0)
MCHC: 32.1 g/dL (ref 30.0–36.0)
MCHC: 32.6 g/dL (ref 30.0–36.0)
MCV: 84.1 fL (ref 80.0–100.0)
MCV: 83.1 fL (ref 80.0–100.0)
Platelets: 292 10*3/uL (ref 150–400)
Platelets: 313 10*3/uL (ref 150–400)
RBC: 3.71 MIL/uL — ABNORMAL LOW (ref 3.87–5.11)
RBC: 3.73 MIL/uL — ABNORMAL LOW (ref 3.87–5.11)
RDW: 14.6 % (ref 11.5–15.5)
RDW: 14.7 % (ref 11.5–15.5)
WBC: 9.2 10*3/uL (ref 4.0–10.5)
WBC: 9.6 10*3/uL (ref 4.0–10.5)
nRBC: 0 % (ref 0.0–0.2)
nRBC: 0 % (ref 0.0–0.2)

## 2021-08-03 LAB — COMPREHENSIVE METABOLIC PANEL
ALT: 13 U/L (ref 0–44)
AST: 13 U/L — ABNORMAL LOW (ref 15–41)
Albumin: 3.2 g/dL — ABNORMAL LOW (ref 3.5–5.0)
Alkaline Phosphatase: 63 U/L (ref 38–126)
Anion gap: 11 (ref 5–15)
BUN: <5 mg/dL — ABNORMAL LOW (ref 6–20)
CO2: 21 mmol/L — ABNORMAL LOW (ref 22–32)
Calcium: 8.4 mg/dL — ABNORMAL LOW (ref 8.9–10.3)
Chloride: 101 mmol/L (ref 98–111)
Creatinine, Ser: 0.4 mg/dL — ABNORMAL LOW (ref 0.44–1.00)
GFR, Estimated: >60 mL/min (ref >60)
Glucose, Bld: 109 mg/dL — ABNORMAL HIGH (ref 70–99)
Potassium: 3.4 mmol/L — ABNORMAL LOW (ref 3.5–5.1)
Sodium: 133 mmol/L — ABNORMAL LOW (ref 135–145)
Total Bilirubin: 1 mg/dL (ref 0.3–1.2)
Total Protein: 6.4 g/dL — ABNORMAL LOW (ref 6.5–8.1)

## 2021-08-03 LAB — OCCULT BLOOD X 1 CARD TO LAB, STOOL: Fecal Occult Bld: POSITIVE — AB

## 2021-08-03 LAB — PROTIME-INR
INR: 1 (ref 0.8–1.2)
Prothrombin Time: 13.4 seconds (ref 11.4–15.2)

## 2021-08-03 LAB — APTT: aPTT: 26 seconds (ref 24–36)

## 2021-08-03 MED ORDER — TRAMADOL HCL 50 MG PO TABS
50.0000 mg | ORAL_TABLET | Freq: Four times a day (QID) | ORAL | Status: DC | PRN
  Administered 2021-08-04 – 2021-08-07 (×11): 50 mg via ORAL
  Filled 2021-08-03 (×12): qty 1

## 2021-08-03 MED ORDER — KCL IN DEXTROSE-NACL 20-5-0.9 MEQ/L-%-% IV SOLN
INTRAVENOUS | Status: DC
  Filled 2021-08-03 (×3): qty 1000

## 2021-08-03 MED ORDER — HYDROCODONE-ACETAMINOPHEN 5-325 MG PO TABS: 1.0000 | ORAL_TABLET | ORAL | Status: DC | PRN

## 2021-08-03 MED ORDER — POTASSIUM CHLORIDE 10 MEQ/100ML IV SOLN
10.0000 meq | INTRAVENOUS | Status: AC
  Administered 2021-08-03: 10 meq via INTRAVENOUS
  Filled 2021-08-03: qty 100

## 2021-08-03 MED ORDER — POTASSIUM CHLORIDE 10 MEQ/100ML IV SOLN: 10.0000 meq | INTRAVENOUS | Status: DC

## 2021-08-03 MED ORDER — CHLORHEXIDINE GLUCONATE 0.12 % MT SOLN
15.0000 mL | Freq: Two times a day (BID) | OROMUCOSAL | Status: DC
  Administered 2021-08-03 – 2021-08-06 (×8): 15 mL via OROMUCOSAL
  Filled 2021-08-03 (×8): qty 15

## 2021-08-03 MED ORDER — ORAL CARE MOUTH RINSE
15.0000 mL | Freq: Two times a day (BID) | OROMUCOSAL | Status: DC
  Administered 2021-08-05 – 2021-08-06 (×3): 15 mL via OROMUCOSAL

## 2021-08-03 MED ORDER — MAGNESIUM SULFATE 2 GM/50ML IV SOLN
2.0000 g | Freq: Once | INTRAVENOUS | Status: AC
Start: 2021-08-03 — End: 2021-08-03
  Administered 2021-08-03: 2 g via INTRAVENOUS
  Filled 2021-08-03: qty 50

## 2021-08-03 MED ORDER — POTASSIUM CHLORIDE 10 MEQ/100ML IV SOLN
10.0000 meq | INTRAVENOUS | Status: AC
  Administered 2021-08-03 (×2): 10 meq via INTRAVENOUS
  Filled 2021-08-03: qty 100

## 2021-08-03 MED ORDER — PANTOPRAZOLE SODIUM 40 MG IV SOLR
40.0000 mg | Freq: Every day | INTRAVENOUS | Status: DC
  Administered 2021-08-03 – 2021-08-04 (×2): 40 mg via INTRAVENOUS
  Filled 2021-08-03 (×2): qty 40

## 2021-08-03 MED ORDER — HYDROMORPHONE HCL 2 MG/ML IJ SOLN
2.0000 mg | Freq: Once | INTRAMUSCULAR | Status: AC
  Administered 2021-08-03: 2 mg via INTRAMUSCULAR
  Filled 2021-08-03: qty 1

## 2021-08-03 NOTE — Progress Notes (Addendum)
GYN Oncology Progress Note  Complex history, now being managed conservatively for an SBO Patient is alert, oriented.  She has episodic moderate abdominal pain.   She was unable to tolerate contrast via the NGT.  The drainage appears dark brown. Continue bowel rest, broad spectrum abx.  Consider adding haloperidol, scopolamine for nausea/pain.     Sarah Crocker, MD

## 2021-08-03 NOTE — Progress Notes (Signed)
Sarah Newman  QVZ:[id number] DOB: 1975-10-02 DOA: 08/01/2021 PCP: Heath Lark, MD    Brief Narrative:  45 year old with a history of ovarian cancer leading to chronic nausea and abdominal pain and moderate protein calorie malnutrition who presented to the ED with 2 days of worsening of global abdominal pain with intractable nausea and vomiting.  In the ED CT abdomen confirmed a small bowel obstruction and also noted a cutaneous abscess.  Significant Events:  12/30 admit via ED 12/30 CT guided aspiration/bx anterior abd wall complex mass in IR   Consultants:  Oncology GYN ONC  Code Status: FULL CODE  Antimicrobials:  Zosyn 12/31 >  DVT prophylaxis: SCDs  Subjective: Afebrile.  Vital signs stable. C/o ongoing abdom discomfort. Is unhappy with the NG tube and wishes to have it removed. Denies cp or sob.   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  5 cc of purulent fluid obtained via aspiration in IR 12/30 - diagnostic laparoscopy on 01/23/2021 with conversion to laparotomy due to adhesions complicated by an enterotomy with repair > postoperative fistula (managed with bowel rest and TPN at that time) - since this initial event multiple recurrences and aspirations have been experienced - care per GYN/ONC who has been addressing this issue long-term  Malignant neoplasm of left ovary -recurrent ovarian cancer Followed by Oncology - 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 rate 17, height 5\' 2"  (1.575 m), weight 72.4 kg, last menstrual period 07/05/2019, SpO2 98 %.  Intake/Output Summary (Last 24 hours) at 08/03/2021 0904 Last data filed at 08/03/2021 0235 Gross per 24 hour  Intake 1280.81 ml  Output 850 ml  Net 430.81 ml   Filed Weights   08/01/21 1130 08/03/21 0500 08/03/21 0700  Weight: 72.6 kg 70.2 kg 72.4 kg    Examination: General: No acute respiratory distress Lungs: Clear to auscultation bilaterally without wheezes or crackles Cardiovascular: Regular rate and rhythm without murmur gallop or rub normal S1 and S2 Abdomen: distended, tympanic, soft, tender to palpation  Extremities: No significant cyanosis, clubbing, or edema bilateral lower extremities  CBC: Recent Labs  Lab 08/01/21 2137 08/02/21 0524 08/03/21 0638  WBC 11.6* 10.4 9.2  HGB 11.5* 10.7* 10.0*  HCT 36.3 33.2* 31.2*  MCV 84.2 82.4 84.1  PLT 276 283 329   Basic Metabolic Panel: Recent Labs  Lab 08/01/21 2137 08/02/21 0524  NA 134* 131*  K 4.0 3.3*  CL 99 98  CO2 20* 23  GLUCOSE 92 116*  BUN 11 9  CREATININE 0.43* 0.42*  CALCIUM 9.0 8.5*  MG  --  1.6*   GFR: Estimated Creatinine Clearance: 83.6 mL/min (A) (by C-G formula based on SCr of 0.42 mg/dL (L)).  Liver Function Tests: Recent Labs  Lab 08/01/21 2137 08/02/21 0524  AST 18 13*  ALT 13 13  ALKPHOS 63 60  BILITOT 1.2 1.0  PROT 6.9 6.4*  ALBUMIN 3.4* 3.1*   Recent Labs  Lab 08/01/21 2137  LIPASE 42   HbA1C: Hgb A1c MFr Bld  Date/Time Value Ref Range Status  09/19/2020 06:36 PM 6.3 (H) 4.8 - 5.6 % Final    Comment:    (NOTE) Pre diabetes:          5.7%-6.4%  Diabetes:              >6.4%  Glycemic control for   <7.0% adults with diabetes   05/29/2020 12:25 PM 6.5 (H) 4.8 - 5.6 % Final    Comment:    (NOTE)         Prediabetes: 5.7 - 6.4         Diabetes: >6.4         Glycemic control for adults with diabetes:  <7.0     Recent Results (from the past 240 hour(s))  Resp Panel by RT-PCR (Flu A&B, Covid) Nasopharyngeal Swab     Status: None   Collection Time: 08/02/21  3:28 AM   Specimen: Nasopharyngeal Swab; Nasopharyngeal(NP) swabs in vial transport medium  Result Value Ref Range Status   SARS Coronavirus 2 by RT PCR NEGATIVE NEGATIVE Final    Comment: (NOTE) SARS-CoV-2 target nucleic acids are NOT DETECTED.  The SARS-CoV-2 RNA is generally detectable in upper respiratory specimens during the acute phase of infection. The lowest concentration of SARS-CoV-2 viral copies this assay can detect is 138 copies/mL. A negative result does not preclude SARS-Cov-2 infection and should not be used as the sole basis for treatment or other patient management decisions. A negative result may occur with  improper specimen collection/handling, submission of specimen other than nasopharyngeal swab, presence of viral mutation(s) within the areas targeted by this assay, and inadequate number of viral copies(<138 copies/mL). A negative result must be combined with clinical observations, patient history, and epidemiological information. The expected result is Negative.  Fact Sheet for Patients:  EntrepreneurPulse.com.au  Fact Sheet for Healthcare Providers:  IncredibleEmployment.be  This test is no t yet approved or cleared by the [place] FDA and  has been authorized for detection and/or diagnosis of SARS-CoV-2 by FDA under an Emergency Use Authorization (EUA). This EUA will remain  in effect (meaning this test can be used) for the duration of the COVID-19 declaration under Section 564(b)(1) of the Act, 21 U.S.C.section 360bbb-3(b)(1), unless the authorization is terminated  or revoked sooner.       Influenza A by PCR NEGATIVE NEGATIVE Final   Influenza B by PCR NEGATIVE NEGATIVE Final    Comment: (NOTE) The Xpert Xpress SARS-CoV-2/FLU/RSV plus assay is  intended as an aid in the diagnosis of influenza from Nasopharyngeal swab specimens and should not be used as a sole basis for treatment. Nasal washings and aspirates are unacceptable for Xpert Xpress SARS-CoV-2/FLU/RSV testing.  Fact Sheet for Patients: EntrepreneurPulse.com.au  Fact Sheet for Healthcare Providers: IncredibleEmployment.be  This test is not yet approved or cleared by the [place] FDA and has been authorized for detection and/or diagnosis of SARS-CoV-2 by FDA under an Emergency Use Authorization (EUA). This EUA will remain in effect (meaning this test can be used) for the duration of the COVID-19 declaration under Section 564(b)(1) of the Act, 21 U.S.C. section 360bbb-3(b)(1), unless the authorization is terminated or revoked.  Performed at [facility], [place] [address], [place], [place] [address]   Aerobic/Anaerobic Culture w Gram Stain (surgical/deep wound)     Status: None (Preliminary result)   Collection Time: 08/02/21  2:25 PM   Specimen: Tissue  Result Value Ref Range  Status   Specimen Description   Final    TISSUE Performed at [facility] [address], [place], [place] [address]    Special Requests   Final    NONE Performed at [facility], [place] [address], [place], [place] [address]    Gram Stain   Final    NO SQUAMOUS EPITHELIAL CELLS SEEN FEW WBC SEEN RARE GRAM POSITIVE COCCI Performed at [facility], [place] [address] [place], [place] [address]    Culture PENDING  Incomplete   Report Status PENDING  Incomplete     Scheduled Meds:  chlorhexidine  15 mL Mouth Rinse BID   mouth rinse  15 mL Mouth Rinse q12n4p   Continuous Infusions:  sodium chloride 100 mL/hr at 08/03/21 0235   piperacillin-tazobactam (ZOSYN)  IV 3.375 g (08/03/21 0606)     LOS: 1 day   Cherene Altes, MD [facility] Office  [phone] Pager - Text  Page per Shea Evans  If 7PM-7AM, please contact night-coverage per Amion 08/03/2021, 9:04 AM

## 2021-08-03 NOTE — Progress Notes (Signed)
NG tube has been removed by patient. On call  provider, X.Blount notified. NG tube was removed by patient, It had been clamped for trial since 5:30pm, patient removed tube because she said that it "wasn't being used and was coming out anyway", she had a BM on day shift today, no complaints of nausea or vomiting right now. doesn't want tube replaced. No new orders at this time. Treatment plan continues.

## 2021-08-03 NOTE — Progress Notes (Signed)
Initial Nutrition Assessment RD working remotely.  DOCUMENTATION CODES:   Not applicable  INTERVENTION:  - diet advancement as medically feasible. - if unable to advance diet in the next 48 hours, will need to consider PICC placement and resumption of TPN.   NUTRITION DIAGNOSIS:   Inadequate oral intake related to inability to eat as evidenced by NPO status.  GOAL:   Patient will meet greater than or equal to 90% of their needs  MONITOR:   Diet advancement, Labs, Weight trends, I & O's  REASON FOR ASSESSMENT:   Malnutrition Screening Tool  ASSESSMENT:   45 y.o. female with medical history of asthma, arthritis, ovarian cancer, moderate protein calorie malnutrition, chronic nausea, chronic abdominal pain. She presented to the ED due to abdominal pain which began 2 days prior and noting that area around fistula was hard and swollen. She was also experiencing N/V. She reported last BM was 3-4 days PTA.  Diet advanced from NPO to [place] today at 1117 and then changed back to NPO at 1120. NGT in place to LIS with 850 ml output during night shift.  Unable to reach patient by phone despite attempt.   Patient being followed by [facility] R[facility] and had been scheduled for a telephone appointment with [place] RD yesterday but she was in the ED. Patient was a no-show for [place] RD appointment on 07/12/21. Patient was a no show for [place] RD appointment on 06/17/21 and was unable to be reached by phone on 06/18/21. Patient has not been seen or had a conversation with [facility] for these reasons.   She was last assessed by an inpatient [place] RD on 05/05/21 at which time she was on a Regular diet. Unfortunately, patient was not available at the time of RD visit to her room and on that date was also unable to be reached by phone.   Weight today is documented as both 155 lb and 160 lb. Weight on 05/31/21 was 170 lb which indicates 10-15 lb weight loss (6-9% body  weight) in the past 2 months.  Patient was noted to be unable to tolerate contrast via NGT. Being managed conservatively for SBO.   Dr. Alvy Bimler is following patient and note from yesterday indicates patient likely to be admitted for 3-5 more days at that point and plan for appointment with Dr. Alvy Bimler on 1/13.    Labs reviewed; Na: 133 mmol/l, K: 3.4 mmol/l, BUN: <5 mg/dl, creatinine: 0.4 mg/dl, Ca: 8.4 mg/dl.  Medications reviewed; 2 g IV Mg sulfate x1 run 12/31, 10 mEq IV KCl x2 runs 12/30 and x3 runs 12/31.  IVF; D5-NS-20 mEq IV KCl @ 100 ml/hr (408 kcal/24 hrs).    NUTRITION - FOCUSED PHYSICAL EXAM:  RD working remotely.   Diet Order:   Diet Order             Diet NPO time specified  Diet effective now                   EDUCATION NEEDS:   Not appropriate for education at this time  Skin:  Skin Assessment: Reviewed RN Assessment  Last BM:  PTA/unknown  Height:   Ht Readings from Last 1 Encounters:  08/01/21 5\' 2"  (1.575 m)    Weight:   Wt Readings from Last 1 Encounters:  08/03/21 72.4 kg     Estimated Nutritional Needs:  Kcal:  2706-2376 kcal Protein:  110-120 grams Fluid:  >/= 2.2  Jarome Matin, MS, RD, LDN, CNSC Inpatient Clinical Dietitian RD pager # available in [place]  After hours/weekend pager # available in [facility]

## 2021-08-04 LAB — COMPREHENSIVE METABOLIC PANEL WITH GFR
ALT: 12 U/L (ref 0–44)
AST: 13 U/L — ABNORMAL LOW (ref 15–41)
Albumin: 2.9 g/dL — ABNORMAL LOW (ref 3.5–5.0)
Alkaline Phosphatase: 62 U/L (ref 38–126)
Anion gap: 8 (ref 5–15)
BUN: <5 mg/dL — ABNORMAL LOW (ref 6–20)
CO2: 23 mmol/L (ref 22–32)
Calcium: 8.3 mg/dL — ABNORMAL LOW (ref 8.9–10.3)
Chloride: 104 mmol/L (ref 98–111)
Creatinine, Ser: 0.31 mg/dL — ABNORMAL LOW (ref 0.44–1.00)
GFR, Estimated: >60 mL/min (ref >60)
Glucose, Bld: 139 mg/dL — ABNORMAL HIGH (ref 70–99)
Potassium: 3.2 mmol/L — ABNORMAL LOW (ref 3.5–5.1)
Sodium: 135 mmol/L (ref 135–145)
Total Bilirubin: 0.7 mg/dL (ref 0.3–1.2)
Total Protein: 6.1 g/dL — ABNORMAL LOW (ref 6.5–8.1)

## 2021-08-04 LAB — PHOSPHORUS: Phosphorus: 2 mg/dL — ABNORMAL LOW (ref 2.5–4.6)

## 2021-08-04 LAB — CBC
HCT: 28.9 % — ABNORMAL LOW (ref 36.0–46.0)
Hemoglobin: 9.1 g/dL — ABNORMAL LOW (ref 12.0–15.0)
MCH: 26.8 pg (ref 26.0–34.0)
MCHC: 31.5 g/dL (ref 30.0–36.0)
MCV: 85 fL (ref 80.0–100.0)
Platelets: 283 10*3/uL (ref 150–400)
RBC: 3.4 MIL/uL — ABNORMAL LOW (ref 3.87–5.11)
RDW: 14.6 % (ref 11.5–15.5)
WBC: 8.3 10*3/uL (ref 4.0–10.5)
nRBC: 0 % (ref 0.0–0.2)

## 2021-08-04 LAB — MAGNESIUM: Magnesium: 1.7 mg/dL (ref 1.7–2.4)

## 2021-08-04 MED ORDER — POTASSIUM CHLORIDE 20 MEQ PO PACK
20.0000 meq | PACK | Freq: Once | ORAL | Status: AC
  Administered 2021-08-04: 20 meq via ORAL
  Filled 2021-08-04: qty 1

## 2021-08-04 MED ORDER — POTASSIUM CHLORIDE 10 MEQ/100ML IV SOLN
10.0000 meq | INTRAVENOUS | Status: AC
  Administered 2021-08-04: 10 meq via INTRAVENOUS
  Filled 2021-08-04 (×3): qty 100

## 2021-08-04 MED ORDER — BOOST / RESOURCE BREEZE PO LIQD CUSTOM
1.0000 | Freq: Three times a day (TID) | ORAL | Status: DC
  Administered 2021-08-04 – 2021-08-07 (×9): 1 via ORAL

## 2021-08-04 MED ORDER — SCOPOLAMINE 1 MG/3DAYS TD PT72
1.0000 | MEDICATED_PATCH | TRANSDERMAL | Status: DC
  Administered 2021-08-04 – 2021-08-07 (×2): 1.5 mg via TRANSDERMAL
  Filled 2021-08-04 (×2): qty 1

## 2021-08-04 MED ORDER — MAGNESIUM SULFATE 2 GM/50ML IV SOLN
2.0000 g | Freq: Once | INTRAVENOUS | Status: AC
  Administered 2021-08-04: 2 g via INTRAVENOUS
  Filled 2021-08-04: qty 50

## 2021-08-04 MED ORDER — HYDROMORPHONE HCL 2 MG/ML IJ SOLN
2.0000 mg | INTRAMUSCULAR | Status: DC | PRN
  Administered 2021-08-04: 2 mg via SUBCUTANEOUS
  Filled 2021-08-04: qty 1

## 2021-08-04 MED ORDER — PANTOPRAZOLE SODIUM 20 MG PO TBEC
20.0000 mg | DELAYED_RELEASE_TABLET | Freq: Two times a day (BID) | ORAL | Status: DC
  Administered 2021-08-04 – 2021-08-07 (×7): 20 mg via ORAL
  Filled 2021-08-04 (×7): qty 1

## 2021-08-04 MED ORDER — HYDROMORPHONE HCL 2 MG/ML IJ SOLN
2.0000 mg | INTRAMUSCULAR | Status: DC | PRN
  Administered 2021-08-04 – 2021-08-07 (×12): 2 mg via INTRAVENOUS
  Filled 2021-08-04 (×14): qty 1

## 2021-08-04 NOTE — Progress Notes (Signed)
GYN Oncology Progress Note  Complex history, now being managed conservatively for an SBO Patient is alert, oriented.  Large BM yesterday.  NGT fell out.  Less nausea.  Continues to have episodic moderate abdominal pain. The NGT drainage was guaiac pos.  GI was consulted--recommend liquid diet, PPI.  No further evaluation at this point unless she has evidence of ongoing bleeding, melenotic stool.  Continue broad spectrum abx.  Going to add scopolamine for nausea/pain.    Lahoma Crocker, MD

## 2021-08-04 NOTE — Progress Notes (Signed)
GYN Oncology Progress Note  Complex history, now being managed conservatively for an SBO Patient is alert, oriented.  She has episodic moderate abdominal pain.   She was unable to tolerate contrast via the NGT.  The drainage appears dark brown. Continue bowel rest, broad spectrum abx.  Consider adding haloperidol, scopolamine for nausea/pain.     Lahoma Crocker, MD

## 2021-08-04 NOTE — Progress Notes (Signed)
Date and time results received: 08/04/21  1120  Test: Aerobic/Anaerobic Culture w Gram Stain (surgical/deep wound)  Critical Value: Gram negative rods   Name of Provider Notified: McClung   Orders Received? Or Actions Taken?:    no new orders received

## 2021-08-04 NOTE — Progress Notes (Signed)
Sarah Newman  NUU:[id number] DOB: 1975-12-21 DOA: 08/01/2021 PCP: Heath Lark, MD    Brief Narrative:  35VQ with a history of ovarian cancer leading to chronic nausea and abdominal pain and moderate protein calorie malnutrition who presented to the ED with 2 days of worsening global abdominal pain with intractable nausea and vomiting.  In the ED CT abdomen confirmed a small bowel obstruction and also noted a probable abscess of the abdom wall.  Significant Events:  12/30 admit via ED 12/30 CT guided aspiration/bx anterior abd wall complex mass in IR   Consultants:  Oncology GYN ONC  Code Status: FULL CODE  Antimicrobials:  Zosyn 12/31 >  DVT prophylaxis: SCDs  Interim history: The patient pulled out her NG tube intentionally last night.  She has done well since that time without vomiting.  GYN consulted GI due to guaiac positive gastric aspirate.  There is no plan to investigate this further at this time.  Afebrile.  Vital signs stable.  Saturation 100% on room air.  Hypokalemia persists.  Magnesium not yet at goal.  Hemoglobin down 1 g from 10.1-9.1.  In good spirits at the time of my exam.  States she continues to move her bowels.  Reports significant improvement in abdominal discomfort.  Denies shortness of breath.  Tolerating her liquid diet thus far.  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  5 cc of purulent fluid obtained via aspiration in IR 12/30 - diagnostic laparoscopy on 01/23/2021 with conversion to laparotomy due to adhesions complicated by an enterotomy with repair > postoperative fistula (managed with bowel rest and TPN at that time) - since this initial event multiple recurrences and aspirations have been experienced - care per GYN/ONC who has been addressing this issue long-term  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 Followed  by Oncology - 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   Moderate protein calorie malnutrition Nutrition to work with patient  Hypokalemia Continue to supplement to goal of 4.0  Hypomagnesemia Supplement to goal of 2.0  Hyponatremia Due to hypovolemia -resolved with volume resuscitation   Normocytic anemia Iron quite low at 18 with low normal TIBC - consider Fe infusion this admit - guaiac positive gastric aspirate not surprising given NG was in (and very irritating to patient) - no gross evidence of ongoing signif bleeding   Family Communication: no family present at time of exam   Objective: Blood pressure 112/75, pulse 100, temperature 98.3 F (36.8 C), temperature source Oral, resp. rate 18, height 5\' 2"  (1.575 m), weight 72.4 kg, last menstrual period 07/05/2019, SpO2 100 %.  Intake/Output Summary (Last 24 hours) at 08/04/2021 1251 Last data filed at 08/04/2021 2595 Gross per 24 hour  Intake 2056.08 ml  Output 900 ml  Net 1156.08 ml    Filed Weights   08/01/21 1130 08/03/21 0500 08/03/21 0700  Weight: 72.6 kg 70.2 kg 72.4 kg    Examination: General: No acute respiratory distress Lungs: Clear to auscultation bilaterally  Cardiovascular: Regular rate and rhythm without murmur gallop or rub normal S1 and S2 Abdomen: distended, tympanic, soft, tender to palpation but not significantly so Extremities: Trace edema bilateral lower extremities  CBC: Recent Labs  Lab 08/03/21 0638 08/03/21 1714 08/04/21 0548  WBC 9.2 9.6 8.3  HGB 10.0* 10.1* 9.1*  HCT 31.2* 31.0* 28.9*  MCV 84.1 83.1 85.0  PLT  292 313 782    Basic Metabolic Panel: Recent Labs  Lab 08/02/21 0524 08/03/21 0638 08/04/21 0548  NA 131* 133* 135  K 3.3* 3.4* 3.2*  CL 98 101 104  CO2 23 21* 23  GLUCOSE 116* 109* 139*  BUN 9 <5* <5*  CREATININE 0.42* 0.40* 0.31*  CALCIUM 8.5* 8.4* 8.3*  MG 1.6*  --  1.7  PHOS  --   --  2.0*    GFR: Estimated Creatinine Clearance:  83.6 mL/min (A) (by C-G formula based on SCr of 0.31 mg/dL (L)).  Liver Function Tests: Recent Labs  Lab 08/01/21 2137 08/02/21 0524 08/03/21 0638 08/04/21 0548  AST 18 13* 13* 13*  ALT 13 13 13 12   ALKPHOS 63 60 63 62  BILITOT 1.2 1.0 1.0 0.7  PROT 6.9 6.4* 6.4* 6.1*  ALBUMIN 3.4* 3.1* 3.2* 2.9*    Recent Labs  Lab 08/01/21 2137  LIPASE 42    HbA1C: Hgb A1c MFr Bld  Date/Time Value Ref Range Status  09/19/2020 06:36 PM 6.3 (H) 4.8 - 5.6 % Final    Comment:    (NOTE) Pre diabetes:          5.7%-6.4%  Diabetes:              >6.4%  Glycemic control for   <7.0% adults with diabetes   05/29/2020 12:25 PM 6.5 (H) 4.8 - 5.6 % Final    Comment:    (NOTE)         Prediabetes: 5.7 - 6.4         Diabetes: >6.4         Glycemic control for adults with diabetes: <7.0     Recent Results (from the past 240 hour(s))  Resp Panel by RT-PCR (Flu A&B, Covid) Nasopharyngeal Swab     Status: None   Collection Time: 08/02/21  3:28 AM   Specimen: Nasopharyngeal Swab; Nasopharyngeal(NP) swabs in vial transport medium  Result Value Ref Range Status   SARS Coronavirus 2 by RT PCR NEGATIVE NEGATIVE Final    Comment: (NOTE) SARS-CoV-2 target nucleic acids are NOT DETECTED.  The SARS-CoV-2 RNA is generally detectable in upper respiratory specimens during the acute phase of infection. The lowest concentration of SARS-CoV-2 viral copies this assay can detect is 138 copies/mL. A negative result does not preclude SARS-Cov-2 infection and should not be used as the sole basis for treatment or other patient management decisions. A negative result may occur with  improper specimen collection/handling, submission of specimen other than nasopharyngeal swab, presence of viral mutation(s) within the areas targeted by this assay, and inadequate number of viral copies(<138 copies/mL). A negative result must be combined with clinical observations, patient history, and  epidemiological information. The expected result is Negative.  Fact Sheet for Patients:  EntrepreneurPulse.com.au  Fact Sheet for Healthcare Providers:  IncredibleEmployment.be  This test is no t yet approved or cleared by the [place] FDA and  has been authorized for detection and/or diagnosis of SARS-CoV-2 by FDA under an Emergency Use Authorization (EUA). This EUA will remain  in effect (meaning this test can be used) for the duration of the COVID-19 declaration under Section 564(b)(1) of the Act, 21 U.S.C.section 360bbb-3(b)(1), unless the authorization is terminated  or revoked sooner.       Influenza A by PCR NEGATIVE NEGATIVE Final   Influenza B by PCR NEGATIVE NEGATIVE Final    Comment: (NOTE) The Xpert Xpress SARS-CoV-2/FLU/RSV plus assay is intended as an aid in the  diagnosis of influenza from Nasopharyngeal swab specimens and should not be used as a sole basis for treatment. Nasal washings and aspirates are unacceptable for Xpert Xpress SARS-CoV-2/FLU/RSV testing.  Fact Sheet for Patients: EntrepreneurPulse.com.au  Fact Sheet for Healthcare Providers: IncredibleEmployment.be  This test is not yet approved or cleared by the [place] FDA and has been authorized for detection and/or diagnosis of SARS-CoV-2 by FDA under an Emergency Use Authorization (EUA). This EUA will remain in effect (meaning this test can be used) for the duration of the COVID-19 declaration under Section 564(b)(1) of the Act, 21 U.S.C. section 360bbb-3(b)(1), unless the authorization is terminated or revoked.  Performed at [facility], [place] [address], [place], [place] [address]   Aerobic/Anaerobic Culture w Gram Stain (surgical/deep wound)     Status: None (Preliminary result)   Collection Time: 08/02/21  2:25 PM   Specimen: Tissue  Result Value Ref Range Status   Specimen Description    Final    TISSUE Performed at [facility] [address] [place], [place] [address]    Special Requests   Final    NONE Performed at [facility], [place] [address] [place], [place] [address]    Gram Stain   Final    NO SQUAMOUS EPITHELIAL CELLS SEEN FEW WBC SEEN RARE GRAM POSITIVE COCCI    Culture   Final    MODERATE KLEBSIELLA OXYTOCA MODERATE GRAM NEGATIVE RODS NO ANAEROBES ISOLATED; CULTURE IN PROGRESS FOR 5 DAYS CRITICAL RESULT CALLED TO, READ BACK BY AND VERIFIED WITH: RN KAITLYN B . 1118 [id number] FCP Performed at [facility], [place] [address], [place], [place] [address]    Report Status PENDING  Incomplete     Scheduled Meds:  chlorhexidine  15 mL Mouth Rinse BID   feeding supplement  1 Container Oral TID BM   mouth rinse  15 mL Mouth Rinse q12n4p   pantoprazole  20 mg Oral BID   scopolamine  1 patch Transdermal Q72H   Continuous Infusions:  dextrose 5 % and 0.9 % NaCl with KCl 20 mEq/L 100 mL/hr at 08/04/21 0812   piperacillin-tazobactam (ZOSYN)  IV 12.5 mL/hr at 08/04/21 2956     LOS: 2 days   Cherene Altes, MD [facility] Office  [phone] Pager - Text Page per Shea Evans  If 7PM-7AM, please contact night-coverage per Amion 08/04/2021, 12:51 PM

## 2021-08-05 DIAGNOSIS — K56609 Unspecified intestinal obstruction, unspecified as to partial versus complete obstruction: Secondary | ICD-10-CM | POA: Diagnosis not present

## 2021-08-05 LAB — COMPREHENSIVE METABOLIC PANEL
ALT: 15 U/L (ref 0–44)
AST: 18 U/L (ref 15–41)
Albumin: 2.9 g/dL — ABNORMAL LOW (ref 3.5–5.0)
Alkaline Phosphatase: 64 U/L (ref 38–126)
Anion gap: 9 (ref 5–15)
BUN: <5 mg/dL — ABNORMAL LOW (ref 6–20)
CO2: 23 mmol/L (ref 22–32)
Calcium: 8.5 mg/dL — ABNORMAL LOW (ref 8.9–10.3)
Chloride: 102 mmol/L (ref 98–111)
Creatinine, Ser: 0.4 mg/dL — ABNORMAL LOW (ref 0.44–1.00)
GFR, Estimated: >60 mL/min (ref >60)
Glucose, Bld: 92 mg/dL (ref 70–99)
Potassium: 3.2 mmol/L — ABNORMAL LOW (ref 3.5–5.1)
Sodium: 134 mmol/L — ABNORMAL LOW (ref 135–145)
Total Bilirubin: 0.7 mg/dL (ref 0.3–1.2)
Total Protein: 5.9 g/dL — ABNORMAL LOW (ref 6.5–8.1)

## 2021-08-05 LAB — MAGNESIUM: Magnesium: 1.8 mg/dL (ref 1.7–2.4)

## 2021-08-05 LAB — CBC
HCT: 28.5 % — ABNORMAL LOW (ref 36.0–46.0)
Hemoglobin: 9.2 g/dL — ABNORMAL LOW (ref 12.0–15.0)
MCH: 26.6 pg (ref 26.0–34.0)
MCHC: 32.3 g/dL (ref 30.0–36.0)
MCV: 82.4 fL (ref 80.0–100.0)
Platelets: 277 10*3/uL (ref 150–400)
RBC: 3.46 MIL/uL — ABNORMAL LOW (ref 3.87–5.11)
RDW: 14.6 % (ref 11.5–15.5)
WBC: 9.9 10*3/uL (ref 4.0–10.5)
nRBC: 0 % (ref 0.0–0.2)

## 2021-08-05 LAB — CA 125: Cancer Antigen (CA) 125: 120 U/mL — ABNORMAL HIGH (ref 0.0–38.1)

## 2021-08-05 MED ORDER — MAGNESIUM SULFATE 2 GM/50ML IV SOLN
2.0000 g | Freq: Once | INTRAVENOUS | Status: AC
  Administered 2021-08-05: 2 g via INTRAVENOUS
  Filled 2021-08-05: qty 50

## 2021-08-05 MED ORDER — OXYCODONE HCL 5 MG PO TABS
5.0000 mg | ORAL_TABLET | ORAL | Status: DC | PRN
  Administered 2021-08-05 – 2021-08-07 (×6): 10 mg via ORAL
  Filled 2021-08-05 (×7): qty 2

## 2021-08-05 MED ORDER — OXYCODONE HCL 5 MG PO TABS: 5.0000 mg | ORAL_TABLET | ORAL | Status: DC | PRN

## 2021-08-05 MED ORDER — MAGNESIUM GLUCONATE 500 MG PO TABS
500.0000 mg | ORAL_TABLET | Freq: Two times a day (BID) | ORAL | Status: DC
  Administered 2021-08-05 – 2021-08-07 (×4): 500 mg via ORAL
  Filled 2021-08-05 (×4): qty 1

## 2021-08-05 MED ORDER — POTASSIUM CHLORIDE 20 MEQ PO PACK
40.0000 meq | PACK | Freq: Three times a day (TID) | ORAL | Status: DC
  Administered 2021-08-05 – 2021-08-07 (×6): 40 meq via ORAL
  Filled 2021-08-05 (×7): qty 2

## 2021-08-05 NOTE — Progress Notes (Signed)
GYN Oncology Progress Note  Complex history, now being managed conservatively for an SBO/abdominal wall abscess. Patient is alert, oriented.  Spontaneous drainage at the umbilicus of large volume pus per the pt.  Continues to have daily BM. Less nausea, abdominal pain.   Continue broad spectrum abx.  Med ONC note appreciated.  Plan for d/c home tomorrow w/transition to oral antibiotics. C&S from abscess: MODERATE KLEBSIELLA OXYTOCA  MODERATE ESCHERICHIA COLI  MODERATE PROTEUS MIRABILIS  CULTURE REINCUBATED FOR BETTER GROWTH  NO ANAEROBES ISOLATED; CULTURE IN PROGRESS FOR 5 DAYS   Lahoma Crocker, MD

## 2021-08-05 NOTE — Progress Notes (Signed)
Sarah Newman  JJH:[id number] DOB: 1976/04/13 DOA: 08/01/2021 PCP: Heath Lark, MD    Brief Narrative:  82EH with a history of ovarian cancer leading to chronic nausea and abdominal pain and moderate protein calorie malnutrition who presented to the ED with 2 days of worsening global abdominal pain with intractable nausea and vomiting.  In the ED CT abdomen confirmed a small bowel obstruction and also noted a probable abscess of the abdom wall.  Significant Events:  12/30 admit via ED 12/30 CT guided aspiration/bx anterior abd wall complex mass in IR   Consultants:  Oncology GYN ONC  Code Status: FULL CODE  Antimicrobials:  Zosyn 12/31 >  DVT prophylaxis: SCDs  Interim history: Afebrile.  Vital signs stable.  Potassium remains below goal at 3.2.  Magnesium improved but not yet at goal at 1.8.  Hemoglobin stable.  WBC normal.  Tissue cultures positive for Klebsiella and Proteus thus far. Clinically iimproving. Tolerating oral intake w/o difficulty. Reports consistent bowel movements. States abdom pain improved but not resolved.   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  5 cc of purulent fluid obtained via aspiration in IR 12/30 - diagnostic laparoscopy on 01/23/2021 with conversion to laparotomy due to adhesions complicated by an enterotomy with repair > postoperative fistula (managed with bowel rest and TPN at that time) - since this initial event multiple recurrences and aspirations have been experienced - care per GYN/ONC who has been addressing this issue long-term - continue IV abx for now - spontaneous drainage (likely following bx tract) noted today   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 Followed by Oncology - 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 - further care in outpt f/u at  [facility]   Moderate protein calorie malnutrition Nutrition to work with patient  Hypokalemia Continue to supplement to goal of 4.0  Hypomagnesemia Supplement to goal of 2.0  Hyponatremia Due to hypovolemia -resolved with volume resuscitation   Normocytic anemia Iron quite low at 18 with low normal TIBC - consider Fe infusion this admit - guaiac positive gastric aspirate not surprising given NG was in (and very irritating to patient) - no gross evidence of ongoing signif bleeding   Family Communication: no family present at time of exam Disposition: possible d/c home on oral abx 08/06/21 if bowels remain active    Objective: Blood pressure 93/61, pulse 90, temperature 98.3 F (36.8 C), temperature source Oral, resp. rate 17, height 5\' 2"  (1.575 m), weight 71 kg, last menstrual period 07/05/2019, SpO2 96 %.  Intake/Output Summary (Last 24 hours) at 08/05/2021 1040 Last data filed at 08/05/2021 0210 Gross per 24 hour  Intake 481.16 ml  Output --  Net 481.16 ml    Filed Weights   08/03/21 0500 08/03/21 0700 08/05/21 0635  Weight: 70.2 kg 72.4 kg 71 kg    Examination: General: No acute respiratory distress Lungs: Clear to auscultation bilaterally - no wheezing  Cardiovascular: Regular rate and rhythm without murmur  Abdomen: BS+, no appreciable mass, soft Extremities: no signif edema B LE   CBC: Recent Labs  Lab 08/03/21 1714 08/04/21 0548 08/05/21 0615  WBC 9.6 8.3 9.9  HGB 10.1* 9.1* 9.2*  HCT 31.0* 28.9* 28.5*  MCV 83.1 85.0 82.4  PLT 313 283 631    Basic Metabolic Panel: Recent Labs  Lab 08/02/21  4854 08/03/21 6270 08/04/21 0548 08/05/21 0615  NA 131* 133* 135 134*  K 3.3* 3.4* 3.2* 3.2*  CL 98 101 104 102  CO2 23 21* 23 23  GLUCOSE 116* 109* 139* 92  BUN 9 <5* <5* <5*  CREATININE 0.42* 0.40* 0.31* 0.40*  CALCIUM 8.5* 8.4* 8.3* 8.5*  MG 1.6*  --  1.7 1.8  PHOS  --   --  2.0*  --     GFR: Estimated Creatinine Clearance: 82.9 mL/min (A) (by  C-G formula based on SCr of 0.4 mg/dL (L)).  Liver Function Tests: Recent Labs  Lab 08/02/21 0524 08/03/21 0638 08/04/21 0548 08/05/21 0615  AST 13* 13* 13* 18  ALT 13 13 12 15   ALKPHOS 60 63 62 64  BILITOT 1.0 1.0 0.7 0.7  PROT 6.4* 6.4* 6.1* 5.9*  ALBUMIN 3.1* 3.2* 2.9* 2.9*    Recent Labs  Lab 08/01/21 2137  LIPASE 42    HbA1C: Hgb A1c MFr Bld  Date/Time Value Ref Range Status  09/19/2020 06:36 PM 6.3 (H) 4.8 - 5.6 % Final    Comment:    (NOTE) Pre diabetes:          5.7%-6.4%  Diabetes:              >6.4%  Glycemic control for   <7.0% adults with diabetes   05/29/2020 12:25 PM 6.5 (H) 4.8 - 5.6 % Final    Comment:    (NOTE)         Prediabetes: 5.7 - 6.4         Diabetes: >6.4         Glycemic control for adults with diabetes: <7.0     Recent Results (from the past 240 hour(s))  Resp Panel by RT-PCR (Flu A&B, Covid) Nasopharyngeal Swab     Status: None   Collection Time: 08/02/21  3:28 AM   Specimen: Nasopharyngeal Swab; Nasopharyngeal(NP) swabs in vial transport medium  Result Value Ref Range Status   SARS Coronavirus 2 by RT PCR NEGATIVE NEGATIVE Final    Comment: (NOTE) SARS-CoV-2 target nucleic acids are NOT DETECTED.  The SARS-CoV-2 RNA is generally detectable in upper respiratory specimens during the acute phase of infection. The lowest concentration of SARS-CoV-2 viral copies this assay can detect is 138 copies/mL. A negative result does not preclude SARS-Cov-2 infection and should not be used as the sole basis for treatment or other patient management decisions. A negative result may occur with  improper specimen collection/handling, submission of specimen other than nasopharyngeal swab, presence of viral mutation(s) within the areas targeted by this assay, and inadequate number of viral copies(<138 copies/mL). A negative result must be combined with clinical observations, patient history, and epidemiological information. The expected  result is Negative.  Fact Sheet for Patients:  EntrepreneurPulse.com.au  Fact Sheet for Healthcare Providers:  IncredibleEmployment.be  This test is no t yet approved or cleared by the [place] FDA and  has been authorized for detection and/or diagnosis of SARS-CoV-2 by FDA under an Emergency Use Authorization (EUA). This EUA will remain  in effect (meaning this test can be used) for the duration of the COVID-19 declaration under Section 564(b)(1) of the Act, 21 U.S.C.section 360bbb-3(b)(1), unless the authorization is terminated  or revoked sooner.       Influenza A by PCR NEGATIVE NEGATIVE Final   Influenza B by PCR NEGATIVE NEGATIVE Final    Comment: (NOTE) The Xpert Xpress SARS-CoV-2/FLU/RSV plus assay is intended as an aid in the  diagnosis of influenza from Nasopharyngeal swab specimens and should not be used as a sole basis for treatment. Nasal washings and aspirates are unacceptable for Xpert Xpress SARS-CoV-2/FLU/RSV testing.  Fact Sheet for Patients: EntrepreneurPulse.com.au  Fact Sheet for Healthcare Providers: IncredibleEmployment.be  This test is not yet approved or cleared by the [place] FDA and has been authorized for detection and/or diagnosis of SARS-CoV-2 by FDA under an Emergency Use Authorization (EUA). This EUA will remain in effect (meaning this test can be used) for the duration of the COVID-19 declaration under Section 564(b)(1) of the Act, 21 U.S.C. section 360bbb-3(b)(1), unless the authorization is terminated or revoked.  Performed at [facility], [place] [address], [place], [place] [address]   Aerobic/Anaerobic Culture w Gram Stain (surgical/deep wound)     Status: None (Preliminary result)   Collection Time: 08/02/21  2:25 PM   Specimen: Tissue  Result Value Ref Range Status   Specimen Description   Final    TISSUE Performed at [place] [address] [place], [place] [address]    Special Requests   Final    NONE Performed at [facility], [place] [address] [place], [place] [address]    Gram Stain   Final    NO SQUAMOUS EPITHELIAL CELLS SEEN FEW WBC SEEN RARE GRAM POSITIVE COCCI Performed at [facility], [place] [address], [place], [place] [address]    Culture   Final    MODERATE KLEBSIELLA OXYTOCA MODERATE GRAM NEGATIVE RODS MODERATE PROTEUS MIRABILIS CRITICAL RESULT CALLED TO, READ BACK BY AND VERIFIED WITH: RN KAITLYN B . [id number] FCP CULTURE REINCUBATED FOR BETTER GROWTH NO ANAEROBES ISOLATED; CULTURE IN PROGRESS FOR 5 DAYS    Report Status PENDING  Incomplete     Scheduled Meds:  chlorhexidine  15 mL Mouth Rinse BID   feeding supplement  1 Container Oral TID BM   mouth rinse  15 mL Mouth Rinse q12n4p   pantoprazole  20 mg Oral BID   scopolamine  1 patch Transdermal Q72H   Continuous Infusions:  piperacillin-tazobactam (ZOSYN)  IV 3.375 g (08/05/21 0624)     LOS: 3 days   Cherene Altes, MD [facility] Office  [phone] Pager - Text Page per Shea Evans  If 7PM-7AM, please contact night-coverage per Amion 08/05/2021, 10:40 AM

## 2021-08-05 NOTE — Progress Notes (Signed)
Sarah Newman   DOB:[birth year]   EH#:[id number]    ASSESSMENT & PLAN:   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[place]) I have reviewed multiple imaging studies  Biopsy is pending   Small bowel obstruction Her examination is abnormal compared to my previous exam 2 weeks ago NG tube was removed and she started eating She has intermittent waves of severe abdominal pain I am concerned she has unresolved bowel obstruction I will order repeat x-ray of her abdomen tomorrow morning to reassess prior to discharge   Serosanguineous discharge around umbilicus Likely due to recent needle biopsy 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 Continue dressing changes for now  Severe abdominal pain Related to SBO As above, repeat x-ray tomorrow   Nausea and vomiting, stable She is on antiemetics She finds scopolamine patch helpful but could be masking signs and symptoms of bowel obstruction Continue the same   Electrolyte imbalances She is receiving electrolyte replacement therapy   Weight loss, non-intentional She has progressive weight loss due to difficulties maintaining oral intake since discontinuation of TPN At most, she can tolerate 800 to 1000 cal at home   Discharge planning She would likely be here for the next day or so I will check on her tomorrow We will repeat x-ray of the abdomen to reassess   All questions were answered. The patient knows to call the clinic with any problems, questions or concerns.   The total time spent in the appointment was 30 minutes encounter with patients including review of chart and various tests results, discussions about plan of care and coordination of care plan  Heath Lark, MD 08/05/2021 10:20 AM  Subjective:  Her NG tube was displaced Saturday night She was placed on clear liquid diet She noted serosanguineous discharge around her umbilicus since yesterday Her pain is slightly improved She was placed on scopolamine patch and she found  that helpful to control her nausea In the span of 15 to 20 minutes of our conversation, I noticed the patient have intermittent waves of severe abdominal pain that lasts for seconds  Objective:  Vitals:   08/04/21 2207 08/05/21 0635  BP: 122/84 93/61  Pulse: 94 90  Resp: 17 17  Temp: 98.1 F (36.7 C) 98.3 F (36.8 C)  SpO2: 99% 96%     Intake/Output Summary (Last 24 hours) at 08/05/2021 1020 Last data filed at 08/05/2021 0210 Gross per 24 hour  Intake 481.16 ml  Output --  Net 481.16 ml    GENERAL:alert, no distress and comfortable ABDOMEN:abdomen hard on palpation at the area superior to the umbilicus with serosanguineous discharge. Musculoskeletal:no cyanosis of digits and no clubbing  NEURO: alert & oriented x 3 with fluent speech, no focal motor/sensory deficits   Labs:  Recent Labs    08/03/21 0638 08/04/21 0548 08/05/21 0615  NA 133* 135 134*  K 3.4* 3.2* 3.2*  CL 101 104 102  CO2 21* 23 23  GLUCOSE 109* 139* 92  BUN <5* <5* <5*  CREATININE 0.40* 0.31* 0.40*  CALCIUM 8.4* 8.3* 8.5*  GFRNONAA >60 >60 >60  PROT 6.4* 6.1* 5.9*  ALBUMIN 3.2* 2.9* 2.9*  AST 13* 13* 18  ALT 13 12 15   ALKPHOS 63 62 64  BILITOT 1.0 0.7 0.7    Studies:  DG Abd 1 View  Result Date: 08/02/2021 CLINICAL DATA:  Nasogastric tube placement. EXAM: ABDOMEN - 1 VIEW COMPARISON:  Abdominal plain film dated May 04, 2021 and abdomen and pelvis CT dated August 01, 2021 FINDINGS: A nasogastric tube is seen with its distal tip looped within the gastric antrum. Dilated small bowel loops are seen within the mid to upper abdomen. Radiopaque contrast is seen within the urinary bladder and bilateral renal collecting systems. Radiopaque surgical clips are noted within the right upper quadrant. No radio-opaque calculi or other significant radiographic abnormality are seen. IMPRESSION: Nasogastric tube positioning, as described above. Electronically Signed   By: Virgina Norfolk M.D.   On: 08/02/2021  02:57   CT ABDOMEN PELVIS W CONTRAST  Result Date: 08/01/2021 CLINICAL DATA:  Bowel obstruction, history of prior fistula EXAM: CT ABDOMEN AND PELVIS WITH CONTRAST TECHNIQUE: Multidetector CT imaging of the abdomen and pelvis was performed using the standard protocol following bolus administration of intravenous contrast. CONTRAST:  176mL OMNIPAQUE IOHEXOL 350 MG/ML SOLN COMPARISON:  07/08/2021 FINDINGS: Lower chest: No acute pleural or parenchymal lung disease. Hepatobiliary: Gallbladder is surgically absent. Postsurgical dilatation of the common bile duct measuring up to 11 mm. No focal liver abnormality. Pancreas: Unremarkable. No pancreatic ductal dilatation or surrounding inflammatory changes. Spleen: Normal in size without focal abnormality. Adrenals/Urinary Tract: Kidneys enhance normally and symmetrically. The adrenals are unremarkable. Bladder is completely decompressed, limiting its evaluation. Stomach/Bowel: There is dilation and hyperenhancement of the mucosa of the duodenum and proximal jejunum, measuring up to 3.7 cm in maximal diameter. The distal small bowel and colon are relatively decompressed. Transition point is in the region of prior abdominal wall fistula, consistent with adhesions. Multilocular fluid collections are seen within the anterior abdominal wall at the site of previous enterocutaneous fistula, measuring up to 2.0 cm. It is unclear whether these reflect recurrent fistula and bowel contents, evaluation limited without oral contrast. Multiple subcutaneous abscesses could also give this appearance. Vascular/Lymphatic: No significant vascular findings are present. No enlarged abdominal or pelvic lymph nodes. Reproductive: Status post hysterectomy. No adnexal masses. Other: Trace upper abdominal ascites again noted. No free intra-abdominal gas. Musculoskeletal: No acute or destructive bony lesions. Reconstructed images demonstrate no additional findings. IMPRESSION: 1. Dilated  hyperenhancing loops of proximal small bowel, consistent with obstruction. Transition point at the site of prior enterocutaneous fistula with multiple adhesions in the region of the umbilicus. 2. Multilocular fluid collections in the subcutaneous tissues of the anterior abdominal wall, at site of previous enterocutaneous fistula. Differential diagnosis includes recurrent fistula versus subcutaneous abscesses. Evaluation limited without oral contrast. 3. Trace ascites, stable. 4. Stable postsurgical dilatation of the common bile duct after cholecystectomy. Electronically Signed   By: Randa Ngo M.D.   On: 08/01/2021 23:53   CT Abdomen Pelvis W Contrast  Result Date: 07/08/2021 CLINICAL DATA:  Persistent postprocedural fistula with new abdominal swelling, pain and firmness around the previous fistula drainage site. EXAM: CT ABDOMEN AND PELVIS WITH CONTRAST TECHNIQUE: Multidetector CT imaging of the abdomen and pelvis was performed using the standard protocol following bolus administration of intravenous contrast. CONTRAST:  60mL OMNIPAQUE IOHEXOL 350 MG/ML SOLN COMPARISON:  Multiple priors including most recent CT May 31, 2021 FINDINGS: Lower chest: No acute abnormality. Hepatobiliary: No suspicious hepatic lesion. Gallbladder surgically absent. Similar mild dilation of the biliary tree post cholecystectomy. Pancreas: No pancreatic ductal dilation or evidence of acute inflammation. Spleen: Normal in size without focal abnormality. Adrenals/Urinary Tract: Bilateral adrenal glands are unremarkable. No hydronephrosis. No solid enhancing renal mass. Urinary bladder is unremarkable for degree of distension. Stomach/Bowel: No enteric contrast was administered. Stomach is unremarkable for degree of distension. No pathologic dilation of large or small bowel. Similar appearance  of the prominent gas and fluid-filled loops of small bowel in the right hemiabdomen without discrete transition, additionally there is  similar wall thickening of these loops of small bowel. There is mixed density fluid and nodularity in the anterior abdomen with multiple loops of bowel traversing this area, however delineation of these bowel loops is difficult without intraluminal contrast material. No walled off fluid collection. Vascular/Lymphatic: No abdominal aortic aneurysm. No pathologically enlarged abdominal or pelvic lymph nodes. Reproductive: Uterus is surgically absent. No suspicious adnexal mass. Other: Increased small volume of abdominopelvic free fluid soft tissue nodularity in the anterior abdomen along the prior fistulous tract appears similar to prior. No new walled off fluid collection. Musculoskeletal: No aggressive lytic or blastic lesion of bone. No acute osseous abnormality. IMPRESSION: 1. No significant change in appearance of the soft tissue nodularity in the anterior abdominal wall in the region of the prior fistulous tract, no new walled off fluid collection. 2. Similar appearance of the mixed density nodular area in the anterior abdomen, possibly reflecting loops of bowel, scarring and ascites however this area is difficult to delineate given the lack of oral contrast material. No intra abdominopelvic walled off fluid collection. 3. Increased small volume of abdominopelvic free fluid with similar peritoneal thickening. 4. Similar wall thickening of the prominent gas and fluid-filled loops of small bowel in the right hemiabdomen without evidence of high-grade obstruction. Electronically Signed   By: Dahlia Bailiff M.D.   On: 07/08/2021 18:53   DG Abd Portable 1V-Small Bowel Obstruction Protocol-initial, 8 hr delay  Result Date: 08/03/2021 CLINICAL DATA:  Small-bowel obstruction 8 hour delay. EXAM: PORTABLE ABDOMEN - 1 VIEW COMPARISON:  August 02, 2021 (2:59 p.m.) FINDINGS: There is stable nasogastric tube positioning. The bowel gas pattern is normal. Radiopaque contrast is seen throughout the ascending, transverse  and descending colon. No dilated bowel loops are identified. Radiopaque surgical clips are seen within the right upper quadrant. No radio-opaque calculi or other significant radiographic abnormality are seen. IMPRESSION: Oral contrast seen throughout the ascending, transverse and descending colon, without visualized dilated bowel loops. Electronically Signed   By: Virgina Norfolk M.D.   On: 08/03/2021 04:33   DG Abd Portable 1V-Small Bowel Protocol-Position Verification  Result Date: 08/02/2021 CLINICAL DATA:  NG tube placement. EXAM: PORTABLE ABDOMEN - 1 VIEW COMPARISON:  Abdominal x-ray 08/02/2021. FINDINGS: Nasogastric tube tip is at the level of the gastroesophageal junction. The distal 2.5 cm of the catheter is folded upon itself. No dilated bowel loops are seen. Cholecystectomy clips are present. Visualized lung bases are clear. No fractures are identified. IMPRESSION: 1. Enteric tube tip is at the level of the gastroesophageal junction in the distal portion of the tip is folded upon itself. Recommend repositioning. Electronically Signed   By: Ronney Asters M.D.   On: 08/02/2021 15:39   DG Abd Portable 1 View  Result Date: 08/02/2021 CLINICAL DATA:  Nasogastric tube placement. EXAM: PORTABLE ABDOMEN - 1 VIEW COMPARISON:  Same day. FINDINGS: Nasogastric tube tip is seen in expected position of proximal stomach. No abnormal bowel dilatation is noted. IMPRESSION: Nasogastric tube tip seen in expected position of proximal stomach. Electronically Signed   By: Marijo Conception M.D.   On: 08/02/2021 09:48   CT ABDOMINAL MASS BIOPSY  Result Date: 08/02/2021 CLINICAL DATA:  hx ovarian CA and enterocutaneous fistula, currently hospitalized due to SBO. CT on 12/29 showed multilocular fluid collections in the subcutaneous tissues of the anterior abdominal wall. EXAM: CT GUIDED NEEDLE ASPIRATE AND  CORE BIOPSY OF ANTERIOR ABDOMINAL WALL COMPLEX MASS ANESTHESIA/SEDATION: Intravenous Fentanyl 36mcg and Versed  2mg  were administered as conscious sedation during continuous monitoring of the patient's level of consciousness and physiological / cardiorespiratory status by the radiology RN, with a total moderate sedation time of 10 minutes. PROCEDURE: The procedure risks, benefits, and alternatives were explained to the patient. Questions regarding the procedure were encouraged and answered. The patient understands and consents to the procedure. Select axial scans through the mid abdomen obtained in the complex anterior abdominal process was localized. An appropriate skin site was determined and marked. The operative field was prepped with chlorhexidinein a sterile fashion, and a sterile drape was applied covering the operative field. A sterile gown and sterile gloves were used for the procedure. Local anesthesia was provided with 1% Lidocaine. Under CT fluoroscopic guidance, a 17 gauge trocar needle was advanced into the lesion. Approximately 4 mL of viscous purulent-appearing material were aspirated, sent for Gram stain and culture. The needle was positioned at the margin of the lesion. Once needle tip position was confirmed, coaxial 18-gauge core biopsy samples were obtained, obtaining 4 solid-appearing cores submitted in formalin to surgical pathology. The guide needle was removed. Postprocedure scans show no hemorrhage or other apparent complication. The patient tolerated the procedure well. COMPLICATIONS: None immediate FINDINGS: Complex lobular anterior abdominal wall process was localized. 4 mL purulent-appearing material aspirated from the lesion. Multiple core biopsy samples were obtained. IMPRESSION: 1. Technically successful CT-guided aspiration and biopsy of anterior abdominal wall process. Electronically Signed   By: Lucrezia Europe M.D.   On: 08/02/2021 14:47

## 2021-08-06 ENCOUNTER — Inpatient Hospital Stay (HOSPITAL_COMMUNITY): Payer: Medicaid Other

## 2021-08-06 ENCOUNTER — Other Ambulatory Visit (HOSPITAL_COMMUNITY): Payer: Self-pay

## 2021-08-06 DIAGNOSIS — K56609 Unspecified intestinal obstruction, unspecified as to partial versus complete obstruction: Secondary | ICD-10-CM | POA: Diagnosis not present

## 2021-08-06 LAB — MAGNESIUM: Magnesium: 1.9 mg/dL (ref 1.7–2.4)

## 2021-08-06 LAB — COMPREHENSIVE METABOLIC PANEL
ALT: 16 U/L (ref 0–44)
AST: 19 U/L (ref 15–41)
Albumin: 2.8 g/dL — ABNORMAL LOW (ref 3.5–5.0)
Alkaline Phosphatase: 65 U/L (ref 38–126)
Anion gap: 8 (ref 5–15)
BUN: <5 mg/dL — ABNORMAL LOW (ref 6–20)
CO2: 24 mmol/L (ref 22–32)
Calcium: 8.4 mg/dL — ABNORMAL LOW (ref 8.9–10.3)
Chloride: 104 mmol/L (ref 98–111)
Creatinine, Ser: 0.52 mg/dL (ref 0.44–1.00)
GFR, Estimated: >60 mL/min (ref >60)
Glucose, Bld: 100 mg/dL — ABNORMAL HIGH (ref 70–99)
Potassium: 3.6 mmol/L (ref 3.5–5.1)
Sodium: 136 mmol/L (ref 135–145)
Total Bilirubin: 0.4 mg/dL (ref 0.3–1.2)
Total Protein: 5.9 g/dL — ABNORMAL LOW (ref 6.5–8.1)

## 2021-08-06 LAB — CBC
HCT: 28.1 % — ABNORMAL LOW (ref 36.0–46.0)
Hemoglobin: 9.3 g/dL — ABNORMAL LOW (ref 12.0–15.0)
MCH: 27.2 pg (ref 26.0–34.0)
MCHC: 33.1 g/dL (ref 30.0–36.0)
MCV: 82.2 fL (ref 80.0–100.0)
Platelets: 281 10*3/uL (ref 150–400)
RBC: 3.42 MIL/uL — ABNORMAL LOW (ref 3.87–5.11)
RDW: 14.6 % (ref 11.5–15.5)
WBC: 5.9 10*3/uL (ref 4.0–10.5)
nRBC: 0 % (ref 0.0–0.2)

## 2021-08-06 MED ORDER — IOHEXOL 350 MG/ML SOLN
80.0000 mL | Freq: Once | INTRAVENOUS | Status: AC | PRN
  Administered 2021-08-06: 80 mL via INTRAVENOUS

## 2021-08-06 MED ORDER — SCOPOLAMINE 1 MG/3DAYS TD PT72
1.0000 | MEDICATED_PATCH | TRANSDERMAL | 0 refills | Status: AC
  Filled 2021-08-06: qty 10, 30d supply, fill #0

## 2021-08-06 MED ORDER — TRAMADOL HCL 50 MG PO TABS
50.0000 mg | ORAL_TABLET | Freq: Four times a day (QID) | ORAL | 0 refills | Status: AC | PRN
  Filled 2021-08-06: qty 20, 5d supply, fill #0

## 2021-08-06 MED ORDER — BARIUM SULFATE 0.1 % PO SUSP
ORAL | Status: AC
  Filled 2021-08-06: qty 1

## 2021-08-06 MED ORDER — POTASSIUM CHLORIDE 20 MEQ PO PACK
40.0000 meq | PACK | Freq: Every day | ORAL | 0 refills | Status: AC
  Filled 2021-08-06 – 2021-08-12 (×2): qty 14, 7d supply, fill #0

## 2021-08-06 MED ORDER — SODIUM CHLORIDE (PF) 0.9 % IJ SOLN
INTRAMUSCULAR | Status: AC
  Filled 2021-08-06: qty 50

## 2021-08-06 MED ORDER — AMOXICILLIN-POT CLAVULANATE 875-125 MG PO TABS
1.0000 | ORAL_TABLET | Freq: Two times a day (BID) | ORAL | 0 refills | Status: DC
  Filled 2021-08-06: qty 14, 7d supply, fill #0

## 2021-08-06 NOTE — Progress Notes (Signed)
PROGRESS NOTE    Sarah Newman  IWL:[id number] DOB: 06-Jan-1976 DOA: 08/01/2021 PCP: Heath Lark, MD   Chief Complaint  Patient presents with   Abdominal Pain   Brief Narrative/Hospital Course:  Sarah Newman, 46 y.o. female with PMH of 46yo with a history of ovarian cancer leading to chronic nausea and abdominal pain and moderate protein calorie malnutrition who presented to the ED with 2 days of worsening global abdominal pain with intractable nausea and vomiting.  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  Based CT Entero may need incision and drainage. Cont with Zosyn-deep culture showed moderate Klebsiella oxytocin, E. coli, Proteus, no anaerobes, staph aureus fewending - sent rx for agumentin- will will need to add doxy/ or zyvox for staph- C/S pending as of today.The fistula is draining spontaneously.    Malignant neoplasm left ovary -follow-up CT scan as above.  GYN oncology following.Requesting Ultram for pain control- rx sent.  Hypokalemia/hypomagnesemia/hyponatremia resolved Moderate protein calorie malnutrition augment diet Normocytic anemia hemoglobin is stable-follow-up with oncology consider iron transfusion once infection resolves-FOBT positive not surprising given NGT.  No acute blood loss noted Recent Labs  Lab 08/03/21 0638 08/03/21 1714 08/04/21 0548 08/05/21 0615 08/06/21 0500  HGB 10.0*  10.1* 9.1* 9.2* 9.3*  HCT 31.2* 31.0* 28.9* 28.5* 28.1*     Overweight:Patient's Body mass index is 28.63 kg/m. : Will benefit with PCP follow-up, weight loss  healthy lifestyle and outpatient sleep evaluation.  DVT prophylaxis: SCDs Start: 08/02/21 0039 Code Status:   Code Status: Full Code Family Communication: plan of care discussed with patient at bedside. Status is: Inpatient Remains inpatient appropriate because: For further CT scan imaging and follow-up Disposition: Currently not medically stable for discharge. Anticipated Disposition: Home tomorrow if okay with GYN oncology  Objective: Vitals last 24 hrs: Vitals:   08/05/21 1325 08/05/21 2116 08/06/21 0417 08/06/21 1156  BP: 104/74 (!) 138/102 98/88 112/81  Pulse: 78 98 84 72  Resp: 16 18 18 16   Temp: 98 F (36.7 C) 98.8 F (37.1 C) 98.2 F (36.8 C) 98 F (36.7 C)  TempSrc: Oral Oral Oral Oral  SpO2: 97% 97% 96% 100%  Weight:      Height:       Weight change:  No intake or output data in the 24 hours ending 08/06/21 1225 Net IO Since Admission: 3,308.05 mL [08/06/21 1225]   Physical Examination: General exam: AA0x3, weak,older than stated age. HEENT:Oral mucosa moist, Ear/Nose WNL grossly,dentition normal. Respiratory system: B/l diminished BS, no use of accessory muscle, non tender. Cardiovascular system: S1 & S2 +,No JVD. Gastrointestinal system: Abdomen soft, NT,ND, BS+. Nervous System:Alert, awake, moving extremities. Extremities: edema none, distal peripheral pulses palpable.  Skin: No rashes, no icterus. MSK: Normal muscle bulk, tone, power.  Medications reviewed:  Scheduled Meds:  chlorhexidine  15 mL Mouth Rinse BID   feeding supplement  1 Container Oral TID BM   magnesium gluconate  500 mg Oral BID   mouth rinse  15 mL Mouth Rinse q12n4p  pantoprazole  20 mg Oral BID   potassium chloride  40 mEq Oral TID   scopolamine  1 patch Transdermal Q72H   Continuous Infusions:  piperacillin-tazobactam  (ZOSYN)  IV 3.375 g (08/06/21 4944)    Diet Order             Diet regular Room service appropriate? Yes; Fluid consistency: Thin  Diet effective now                   Nutrition Problem: Inadequate oral intake Etiology: inability to eat Signs/Symptoms: NPO status Interventions: Refer to RD note for recommendations  Weight change:   Wt Readings from Last 3 Encounters:  08/05/21 71 kg  07/16/21 72.7 kg  06/12/21 74.8 kg     Consultants:see note  Procedures:see note Antimicrobials: Anti-infectives (From admission, onward)    Start     Dose/Rate Route Frequency Ordered Stop   08/06/21 0000  amoxicillin-clavulanate (AUGMENTIN) 875-125 MG tablet        1 tablet Oral 2 times daily 08/06/21 1105 08/13/21 2359   08/03/21 0500  piperacillin-tazobactam (ZOSYN) IVPB 3.375 g        3.375 g 12.5 mL/hr over 240 Minutes Intravenous Every 8 hours 08/02/21 1721     08/02/21 1815  piperacillin-tazobactam (ZOSYN) IVPB 3.375 g        3.375 g 100 mL/hr over 30 Minutes Intravenous  Once 08/02/21 1723 08/02/21 2103      Culture/Microbiology    Component Value Date/Time   SDES TISSUE 08/02/2021 1425   SPECREQUEST NONE 08/02/2021 1425   CULT  08/02/2021 1425    MODERATE KLEBSIELLA OXYTOCA MODERATE ESCHERICHIA COLI MODERATE PROTEUS MIRABILIS FEW STAPHYLOCOCCUS AUREUS SUSCEPTIBILITIES TO FOLLOW CRITICAL RESULT CALLED TO, READ BACK BY AND VERIFIED WITH: RN KAITLYN B . [id number] FCP NO ANAEROBES ISOLATED; CULTURE IN PROGRESS FOR 5 DAYS    REPTSTATUS PENDING 08/02/2021 1425    Other culture-see note  Unresulted Labs (From admission, onward)    None      Data Reviewed: I have personally reviewed following labs and imaging studies CBC: Recent Labs  Lab 08/03/21 0638 08/03/21 1714 08/04/21 0548 08/05/21 0615 08/06/21 0500  WBC 9.2 9.6 8.3 9.9 5.9  HGB 10.0* 10.1* 9.1* 9.2* 9.3*  HCT 31.2* 31.0* 28.9* 28.5* 28.1*  MCV 84.1 83.1 85.0 82.4 82.2  PLT 292 313 283 277 967    Basic Metabolic Panel: Recent Labs  Lab 08/02/21 0524 08/03/21 0638 08/04/21 0548 08/05/21 0615 08/06/21 0500  NA 131* 133* 135 134* 136  K 3.3* 3.4* 3.2* 3.2* 3.6  CL 98 101 104 102 104  CO2 23 21* 23 23 24   GLUCOSE 116* 109* 139* 92 100*  BUN 9 <5* <5* <5* <5*  CREATININE 0.42* 0.40* 0.31* 0.40* 0.52  CALCIUM 8.5* 8.4* 8.3* 8.5* 8.4*  MG 1.6*  --  1.7 1.8 1.9  PHOS  --   --  2.0*  --   --    GFR: Estimated Creatinine Clearance: 82.9 mL/min (by C-G formula based on SCr of 0.52 mg/dL). Liver Function Tests: Recent Labs  Lab 08/02/21 0524 08/03/21 5916 08/04/21 0548 08/05/21 0615 08/06/21 0500  AST 13* 13* 13* 18 19  ALT 13 13 12 15 16   ALKPHOS 60 63 62 64 65  BILITOT 1.0 1.0 0.7 0.7 0.4  PROT 6.4* 6.4* 6.1* 5.9* 5.9*  ALBUMIN 3.1* 3.2* 2.9* 2.9* 2.8*   Recent Labs  Lab 08/01/21 2137  LIPASE 42   No results for input(s):  AMMONIA in the last 168 hours. Coagulation Profile: Recent Labs  Lab 08/03/21 1714  INR 1.0   Cardiac Enzymes: No results for input(s): CKTOTAL, CKMB, CKMBINDEX, TROPONINI in the last 168 hours. BNP (last 3 results) No results for input(s): PROBNP in the last 8760 hours. HbA1C: No results for input(s): HGBA1C in the last 72 hours. CBG: No results for input(s): GLUCAP in the last 168 hours. Lipid Profile: No results for input(s): CHOL, HDL, LDLCALC, TRIG, CHOLHDL, LDLDIRECT in the last 72 hours. Thyroid Function Tests: No results for input(s): TSH, T4TOTAL, FREET4, T3FREE, THYROIDAB in the last 72 hours. Anemia Panel: No results for input(s): VITAMINB12, FOLATE, FERRITIN, TIBC, IRON, RETICCTPCT in the last 72 hours. Sepsis Labs: No results for input(s): PROCALCITON, LATICACIDVEN in the last 168 hours.  Recent Results (from the past 240 hour(s))  Resp Panel by RT-PCR (Flu A&B, Covid) Nasopharyngeal Swab     Status: None   Collection Time: 08/02/21  3:28 AM   Specimen: Nasopharyngeal Swab; Nasopharyngeal(NP) swabs in vial transport  medium  Result Value Ref Range Status   SARS Coronavirus 2 by RT PCR NEGATIVE NEGATIVE Final    Comment: (NOTE) SARS-CoV-2 target nucleic acids are NOT DETECTED.  The SARS-CoV-2 RNA is generally detectable in upper respiratory specimens during the acute phase of infection. The lowest concentration of SARS-CoV-2 viral copies this assay can detect is 138 copies/mL. A negative result does not preclude SARS-Cov-2 infection and should not be used as the sole basis for treatment or other patient management decisions. A negative result may occur with  improper specimen collection/handling, submission of specimen other than nasopharyngeal swab, presence of viral mutation(s) within the areas targeted by this assay, and inadequate number of viral copies(<138 copies/mL). A negative result must be combined with clinical observations, patient history, and epidemiological information. The expected result is Negative.  Fact Sheet for Patients:  EntrepreneurPulse.com.au  Fact Sheet for Healthcare Providers:  IncredibleEmployment.be  This test is no t yet approved or cleared by the [place] FDA and  has been authorized for detection and/or diagnosis of SARS-CoV-2 by FDA under an Emergency Use Authorization (EUA). This EUA will remain  in effect (meaning this test can be used) for the duration of the COVID-19 declaration under Section 564(b)(1) of the Act, 21 U.S.C.section 360bbb-3(b)(1), unless the authorization is terminated  or revoked sooner.       Influenza A by PCR NEGATIVE NEGATIVE Final   Influenza B by PCR NEGATIVE NEGATIVE Final    Comment: (NOTE) The Xpert Xpress SARS-CoV-2/FLU/RSV plus assay is intended as an aid in the diagnosis of influenza from Nasopharyngeal swab specimens and should not be used as a sole basis for treatment. Nasal washings and aspirates are unacceptable for Xpert Xpress SARS-CoV-2/FLU/RSV testing.  Fact Sheet for  Patients: EntrepreneurPulse.com.au  Fact Sheet for Healthcare Providers: IncredibleEmployment.be  This test is not yet approved or cleared by the [place] FDA and has been authorized for detection and/or diagnosis of SARS-CoV-2 by FDA under an Emergency Use Authorization (EUA). This EUA will remain in effect (meaning this test can be used) for the duration of the COVID-19 declaration under Section 564(b)(1) of the Act, 21 U.S.C. section 360bbb-3(b)(1), unless the authorization is terminated or revoked.  Performed at [facility], [place] [address] [place], [place] [address]   Aerobic/Anaerobic Culture w Gram Stain (surgical/deep wound)     Status: None (Preliminary result)   Collection Time: 08/02/21  2:25 PM   Specimen: Tissue  Result Value  Ref Range Status   Specimen Description TISSUE  Final   Special Requests NONE  Final   Gram Stain   Final    NO SQUAMOUS EPITHELIAL CELLS SEEN FEW WBC SEEN RARE GRAM POSITIVE COCCI    Culture   Final    MODERATE KLEBSIELLA OXYTOCA MODERATE ESCHERICHIA COLI MODERATE PROTEUS MIRABILIS FEW STAPHYLOCOCCUS AUREUS SUSCEPTIBILITIES TO FOLLOW CRITICAL RESULT CALLED TO, READ BACK BY AND VERIFIED WITH: RN KAITLYN B . [phone] [id number] FCP NO ANAEROBES ISOLATED; CULTURE IN PROGRESS FOR 5 DAYS    Report Status PENDING  Incomplete   Organism ID, Bacteria KLEBSIELLA OXYTOCA  Final   Organism ID, Bacteria ESCHERICHIA COLI  Final   Organism ID, Bacteria PROTEUS MIRABILIS  Final      Susceptibility   Escherichia coli - MIC*    AMPICILLIN 8 SENSITIVE Sensitive     CEFAZOLIN <=4 SENSITIVE Sensitive     CEFEPIME <=0.12 SENSITIVE Sensitive     CEFTAZIDIME <=1 SENSITIVE Sensitive     CEFTRIAXONE <=0.25 SENSITIVE Sensitive     CIPROFLOXACIN <=0.25 SENSITIVE Sensitive     GENTAMICIN <=1 SENSITIVE Sensitive     IMIPENEM <=0.25 SENSITIVE Sensitive     TRIMETH/SULFA <=20 SENSITIVE Sensitive      AMPICILLIN/SULBACTAM <=2 SENSITIVE Sensitive     PIP/TAZO <=4 SENSITIVE Sensitive     * MODERATE ESCHERICHIA COLI   Klebsiella oxytoca - MIC*    AMPICILLIN >=32 RESISTANT Resistant     CEFAZOLIN 16 SENSITIVE Sensitive     CEFEPIME <=0.12 SENSITIVE Sensitive     CEFTAZIDIME <=1 SENSITIVE Sensitive     CEFTRIAXONE <=0.25 SENSITIVE Sensitive     CIPROFLOXACIN <=0.25 SENSITIVE Sensitive     GENTAMICIN <=1 SENSITIVE Sensitive     IMIPENEM <=0.25 SENSITIVE Sensitive     TRIMETH/SULFA <=20 SENSITIVE Sensitive     AMPICILLIN/SULBACTAM 8 SENSITIVE Sensitive     PIP/TAZO <=4 SENSITIVE Sensitive     * MODERATE KLEBSIELLA OXYTOCA   Proteus mirabilis - MIC*    AMPICILLIN <=2 SENSITIVE Sensitive     CEFAZOLIN <=4 SENSITIVE Sensitive     CEFEPIME <=0.12 SENSITIVE Sensitive     CEFTAZIDIME <=1 SENSITIVE Sensitive     CEFTRIAXONE <=0.25 SENSITIVE Sensitive     CIPROFLOXACIN <=0.25 SENSITIVE Sensitive     GENTAMICIN <=1 SENSITIVE Sensitive     IMIPENEM 1 SENSITIVE Sensitive     TRIMETH/SULFA <=20 SENSITIVE Sensitive     AMPICILLIN/SULBACTAM <=2 SENSITIVE Sensitive     PIP/TAZO Value in next row Sensitive      <=4 SENSITIVEPerformed at [facility], [address] [place], [place] [address]    * MODERATE PROTEUS MIRABILIS     Radiology Studies: DG Abd 2 Views  Result Date: 08/06/2021 CLINICAL DATA:  46 year old female with ovarian cancer, enterocutaneous fistula, hospitalized due to small-bowel obstruction. Abdominal wall abscess. EXAM: ABDOMEN - 2 VIEW COMPARISON:  KUB 08/03/2021 and earlier. FINDINGS: Upright and supine views at 1038 hours. Stable cholecystectomy clips. Stable lung bases with mild elevation of the right hemidiaphragm. Enteric tube removed. Abnormal contour of what appears to be IV contrast excreted into the urinary bladder in the central pelvis. Small volume retained oral contrast in nondilated ascending and descending large bowel, with other gas-filled small and large bowel  loops in the abdomen and pelvis not significantly changed from imaging last month. Superimposed pelvic phleboliths. No acute osseous abnormality identified. IMPRESSION: 1. Lobulated, irregular contour of the urinary bladder presumably containing excreted IV contrast. This bladder irregularity is nonspecific  but may be sequelae of ovarian cancer or cancer treatment. 2. Abnormal but nonobstructed appearing bowel gas pattern not significantly changed since 08/01/2021. Electronically Signed   By: Genevie Ann M.D.   On: 08/06/2021 10:37     LOS: 4 days   Antonieta Pert, MD [facility]  08/06/2021, 12:25 PM

## 2021-08-06 NOTE — Progress Notes (Signed)
Report given and care turned over to [employer] [facility] [employer] B. RN at this time.

## 2021-08-06 NOTE — Progress Notes (Signed)
Nutrition Follow-up  DOCUMENTATION CODES:   Non-severe (moderate) malnutrition in context of chronic illness  INTERVENTION:   -Boost Breeze po TID, each supplement provides 250 kcal and 9 grams of protein  -Will provide handouts that patient requested including food lists and high protein high calorie information  NUTRITION DIAGNOSIS:   Moderate Malnutrition related to chronic illness, cancer and cancer related treatments as evidenced by percent weight loss, energy intake < or equal to 75% for > or equal to 1 month.  GOAL:   Patient will meet greater than or equal to 90% of their needs  MONITOR:   PO intake, Supplement acceptance, Labs, Weight trends, I & O's  REASON FOR ASSESSMENT:   Consult Assessment of nutrition requirement/status  ASSESSMENT:   46 y.o. female with medical history of asthma, arthritis, ovarian cancer, moderate protein calorie malnutrition, chronic nausea, chronic abdominal pain. She presented to the ED due to abdominal pain which began 2 days prior and noting that area around fistula was hard and swollen. She was also experiencing N/V. She reported last BM was 3-4 days PTA.  Pt reports wanting to speak with dietitian at [facility]. Had an appointment scheduled for 12/30 but was admitted.  Pt missed 3 prior appointments. States she is eager to remedy this and follow-up with RDs now.   Pt drinking Boost Breeze during this admission. Tried to eat grits this morning but states they were too clumped up.   Pt reports TPN was stopped in August 2022. Since then she has struggled to maintain her nutrition and consume enough kcals/protein. Says she now has low K and Mg levels as she was not eating but drinking a lot of alkaline water. Pt requesting food lists for high magnesium and potassium foods.   Reports that some days when she was not eating as well she would sip on Boost Breeze all day or Gatorade with protein. At most she would consume 2 Boost Breezes  daily (~500 kcals, 18g protein).  Pt likes to make smoothies, she got a bullet for Christmas and wants to try and make protein shakes with it. Recommended pt find a protein powder to add to her smoothies and add avocado for added fats and kcals.   UBW 222 lbs in June 2022. Per weight records, pt has lost 30 lbs since 9/1 (16% wt loss x 4 months, significant for time frame).   Medications: [place], KLOR-CON,  Labs reviewed.  NUTRITION - FOCUSED PHYSICAL EXAM:  Flowsheet Row Most Recent Value  Orbital Region No depletion  Upper Arm Region No depletion  Thoracic and Lumbar Region Unable to assess  Buccal Region No depletion  Temple Region No depletion  Clavicle Bone Region No depletion  Clavicle and Acromion Bone Region No depletion  Scapular Bone Region No depletion  Dorsal Hand No depletion  Patellar Region Unable to assess  Anterior Thigh Region Unable to assess  Posterior Calf Region Unable to assess  Edema (RD Assessment) None  Hair Reviewed  Eyes Reviewed  Mouth Reviewed       Diet Order:   Diet Order             Diet regular Room service appropriate? Yes; Fluid consistency: Thin  Diet effective now                   EDUCATION NEEDS:   Education needs have been addressed  Skin:  Skin Assessment: Reviewed RN Assessment  Last BM:  1/2 -type 7  Height:  Ht Readings from Last 1 Encounters:  08/01/21 5\' 2"  (1.575 m)    Weight:   Wt Readings from Last 1 Encounters:  08/05/21 71 kg   BMI:  Body mass index is 28.63 kg/m.  Estimated Nutritional Needs:   Kcal:  1194-1740 kcal  Protein:  110-120 grams  Fluid:  >/= 2.2  Clayton Bibles, MS, RD, LDN Inpatient Clinical Dietitian Contact information available via Amion

## 2021-08-06 NOTE — Progress Notes (Addendum)
°  Transition of Care (TOC) Screening Note   Patient Details  Name: Sarah Newman Date of Birth: 26-Aug-1975   Transition of Care [facility]) CM/SW Contact:    Janeane Cozart, Marjie Skiff, RN Phone Number: 08/06/2021, 11:09 AM    Transition of Care Department [facility]) has reviewed patient and no TOC needs have been identified at this time. We will continue to monitor patient advancement through interdisciplinary progression rounds. If new patient transition needs arise, please place a TOC consult.   30 Day Unplanned Readmission Risk Score    Flowsheet Row ED to Hosp-Admission (Current) from 08/01/2021 in [place] [facility]  30 Day Unplanned Readmission Risk Score (%) 39.62 Filed at 08/06/2021 0801       This score is the patient's risk of an unplanned readmission within 30 days of being discharged (0 -100%). The score is based on dignosis, age, lab data, medications, orders, and past utilization.   Low:  0-14.9   Medium: 15-21.9   High: 22-29.9   Extreme: 30 and above         Readmission Risk Prevention Plan 08/06/2021 02/06/2021  Transportation Screening Complete Complete  PCP or Specialist Appt within 5-7 Days - Complete  Home Care Screening - Complete  Medication Review (RN CM) - Complete  Medication Review Press photographer) Complete -  PCP or Specialist appointment within 3-5 days of discharge Complete -  [place] or Home Care Consult Complete -  SW Recovery Care/Counseling Consult Complete -  Palliative Care Screening Not Applicable -  [place] Not Applicable -  Some recent data might be hidden

## 2021-08-06 NOTE — Progress Notes (Signed)
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[place]) I have reviewed multiple imaging studies  Biopsy is pending She has appointment to see me back next week for further follow-up   Small bowel obstruction Her examination is abnormal compared to my previous exam 2 weeks ago NG tube was removed and she started eating She has intermittent waves of severe abdominal pain I am concerned she has unresolved bowel obstruction although symptoms while she is much improved I recommend repeating x-ray before discharge   Serosanguineous discharge around umbilicus Likely due to recent needle biopsy 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 Continue dressing changes for now   Severe abdominal pain, resolving Related to SBO As above, repeat x-ray today   Nausea and vomiting, stable She is on antiemetics She finds scopolamine patch helpful but could be masking signs and symptoms of bowel obstruction Continue the same   Electrolyte imbalances She is receiving electrolyte replacement therapy   Weight loss, non-intentional She has progressive weight loss due to difficulties maintaining oral intake since discontinuation of TPN At most, she can tolerate 800 to 1000 cal at home   Discharge planning I am hopeful she can be discharged today, pending results from x-ray  All questions were answered. The patient knows to call the clinic with any problems, questions or concerns.   The total time spent in the appointment was 30 minutes encounter with patients including review of chart and various tests results, discussions about plan of care and coordination of care plan  Heath Lark, MD 08/06/2021 8:19 AM  Subjective:  Awaiting repeat x-ray of the abdomen Since yesterday, she has significant drainage coming from the incision/biopsy site with significant improvement of her pain She is able to tolerate some soup without nausea or  abdominal pain  Objective:  Vitals:   08/05/21 2116 08/06/21 0417  BP: (!) 138/102 98/88  Pulse: 98 84  Resp: 18 18  Temp: 98.8 F (37.1 C) 98.2 F (36.8 C)  SpO2: 97% 96%     Intake/Output Summary (Last 24 hours) at 08/06/2021 0819 Last data filed at 08/05/2021 1100 Gross per 24 hour  Intake 240 ml  Output --  Net 240 ml    GENERAL:alert, no distress and comfortable ABDOMEN:abdomen shows some persistent mass near the periumbilical region but slightly improved in size compared to yesterday's exam Musculoskeletal:no cyanosis of digits and no clubbing  NEURO: alert & oriented x 3 with fluent speech, no focal motor/sensory deficits   Labs:  Recent Labs    08/04/21 0548 08/05/21 0615 08/06/21 0500  NA 135 134* 136  K 3.2* 3.2* 3.6  CL 104 102 104  CO2 23 23 24   GLUCOSE 139* 92 100*  BUN <5* <5* <5*  CREATININE 0.31* 0.40* 0.52  CALCIUM 8.3* 8.5* 8.4*  GFRNONAA >60 >60 >60  PROT 6.1* 5.9* 5.9*  ALBUMIN 2.9* 2.9* 2.8*  AST 13* 18 19  ALT 12 15 16   ALKPHOS 62 64 65  BILITOT 0.7 0.7 0.4    Studies:  DG Abd 1 View  Result Date: 08/02/2021 CLINICAL DATA:  Nasogastric tube placement. EXAM: ABDOMEN - 1 VIEW COMPARISON:  Abdominal plain film dated May 04, 2021 and abdomen and pelvis CT dated August 01, 2021 FINDINGS: A nasogastric tube is seen with its distal tip looped within the gastric antrum. Dilated small bowel loops are seen within the mid to upper abdomen. Radiopaque contrast is seen  within the urinary bladder and bilateral renal collecting systems. Radiopaque surgical clips are noted within the right upper quadrant. No radio-opaque calculi or other significant radiographic abnormality are seen. IMPRESSION: Nasogastric tube positioning, as described above. Electronically Signed   By: Virgina Norfolk M.D.   On: 08/02/2021 02:57   CT ABDOMEN PELVIS W CONTRAST  Result Date: 08/01/2021 CLINICAL DATA:  Bowel obstruction, history of prior fistula EXAM: CT ABDOMEN AND  PELVIS WITH CONTRAST TECHNIQUE: Multidetector CT imaging of the abdomen and pelvis was performed using the standard protocol following bolus administration of intravenous contrast. CONTRAST:  170mL OMNIPAQUE IOHEXOL 350 MG/ML SOLN COMPARISON:  07/08/2021 FINDINGS: Lower chest: No acute pleural or parenchymal lung disease. Hepatobiliary: Gallbladder is surgically absent. Postsurgical dilatation of the common bile duct measuring up to 11 mm. No focal liver abnormality. Pancreas: Unremarkable. No pancreatic ductal dilatation or surrounding inflammatory changes. Spleen: Normal in size without focal abnormality. Adrenals/Urinary Tract: Kidneys enhance normally and symmetrically. The adrenals are unremarkable. Bladder is completely decompressed, limiting its evaluation. Stomach/Bowel: There is dilation and hyperenhancement of the mucosa of the duodenum and proximal jejunum, measuring up to 3.7 cm in maximal diameter. The distal small bowel and colon are relatively decompressed. Transition point is in the region of prior abdominal wall fistula, consistent with adhesions. Multilocular fluid collections are seen within the anterior abdominal wall at the site of previous enterocutaneous fistula, measuring up to 2.0 cm. It is unclear whether these reflect recurrent fistula and bowel contents, evaluation limited without oral contrast. Multiple subcutaneous abscesses could also give this appearance. Vascular/Lymphatic: No significant vascular findings are present. No enlarged abdominal or pelvic lymph nodes. Reproductive: Status post hysterectomy. No adnexal masses. Other: Trace upper abdominal ascites again noted. No free intra-abdominal gas. Musculoskeletal: No acute or destructive bony lesions. Reconstructed images demonstrate no additional findings. IMPRESSION: 1. Dilated hyperenhancing loops of proximal small bowel, consistent with obstruction. Transition point at the site of prior enterocutaneous fistula with multiple  adhesions in the region of the umbilicus. 2. Multilocular fluid collections in the subcutaneous tissues of the anterior abdominal wall, at site of previous enterocutaneous fistula. Differential diagnosis includes recurrent fistula versus subcutaneous abscesses. Evaluation limited without oral contrast. 3. Trace ascites, stable. 4. Stable postsurgical dilatation of the common bile duct after cholecystectomy. Electronically Signed   By: Randa Ngo M.D.   On: 08/01/2021 23:53   CT Abdomen Pelvis W Contrast  Result Date: 07/08/2021 CLINICAL DATA:  Persistent postprocedural fistula with new abdominal swelling, pain and firmness around the previous fistula drainage site. EXAM: CT ABDOMEN AND PELVIS WITH CONTRAST TECHNIQUE: Multidetector CT imaging of the abdomen and pelvis was performed using the standard protocol following bolus administration of intravenous contrast. CONTRAST:  37mL OMNIPAQUE IOHEXOL 350 MG/ML SOLN COMPARISON:  Multiple priors including most recent CT May 31, 2021 FINDINGS: Lower chest: No acute abnormality. Hepatobiliary: No suspicious hepatic lesion. Gallbladder surgically absent. Similar mild dilation of the biliary tree post cholecystectomy. Pancreas: No pancreatic ductal dilation or evidence of acute inflammation. Spleen: Normal in size without focal abnormality. Adrenals/Urinary Tract: Bilateral adrenal glands are unremarkable. No hydronephrosis. No solid enhancing renal mass. Urinary bladder is unremarkable for degree of distension. Stomach/Bowel: No enteric contrast was administered. Stomach is unremarkable for degree of distension. No pathologic dilation of large or small bowel. Similar appearance of the prominent gas and fluid-filled loops of small bowel in the right hemiabdomen without discrete transition, additionally there is similar wall thickening of these loops of small bowel. There is mixed density  fluid and nodularity in the anterior abdomen with multiple loops of bowel  traversing this area, however delineation of these bowel loops is difficult without intraluminal contrast material. No walled off fluid collection. Vascular/Lymphatic: No abdominal aortic aneurysm. No pathologically enlarged abdominal or pelvic lymph nodes. Reproductive: Uterus is surgically absent. No suspicious adnexal mass. Other: Increased small volume of abdominopelvic free fluid soft tissue nodularity in the anterior abdomen along the prior fistulous tract appears similar to prior. No new walled off fluid collection. Musculoskeletal: No aggressive lytic or blastic lesion of bone. No acute osseous abnormality. IMPRESSION: 1. No significant change in appearance of the soft tissue nodularity in the anterior abdominal wall in the region of the prior fistulous tract, no new walled off fluid collection. 2. Similar appearance of the mixed density nodular area in the anterior abdomen, possibly reflecting loops of bowel, scarring and ascites however this area is difficult to delineate given the lack of oral contrast material. No intra abdominopelvic walled off fluid collection. 3. Increased small volume of abdominopelvic free fluid with similar peritoneal thickening. 4. Similar wall thickening of the prominent gas and fluid-filled loops of small bowel in the right hemiabdomen without evidence of high-grade obstruction. Electronically Signed   By: Dahlia Bailiff M.D.   On: 07/08/2021 18:53   DG Abd Portable 1V-Small Bowel Obstruction Protocol-initial, 8 hr delay  Result Date: 08/03/2021 CLINICAL DATA:  Small-bowel obstruction 8 hour delay. EXAM: PORTABLE ABDOMEN - 1 VIEW COMPARISON:  August 02, 2021 (2:59 p.m.) FINDINGS: There is stable nasogastric tube positioning. The bowel gas pattern is normal. Radiopaque contrast is seen throughout the ascending, transverse and descending colon. No dilated bowel loops are identified. Radiopaque surgical clips are seen within the right upper quadrant. No radio-opaque calculi  or other significant radiographic abnormality are seen. IMPRESSION: Oral contrast seen throughout the ascending, transverse and descending colon, without visualized dilated bowel loops. Electronically Signed   By: Virgina Norfolk M.D.   On: 08/03/2021 04:33   DG Abd Portable 1V-Small Bowel Protocol-Position Verification  Result Date: 08/02/2021 CLINICAL DATA:  NG tube placement. EXAM: PORTABLE ABDOMEN - 1 VIEW COMPARISON:  Abdominal x-ray 08/02/2021. FINDINGS: Nasogastric tube tip is at the level of the gastroesophageal junction. The distal 2.5 cm of the catheter is folded upon itself. No dilated bowel loops are seen. Cholecystectomy clips are present. Visualized lung bases are clear. No fractures are identified. IMPRESSION: 1. Enteric tube tip is at the level of the gastroesophageal junction in the distal portion of the tip is folded upon itself. Recommend repositioning. Electronically Signed   By: Ronney Asters M.D.   On: 08/02/2021 15:39   DG Abd Portable 1 View  Result Date: 08/02/2021 CLINICAL DATA:  Nasogastric tube placement. EXAM: PORTABLE ABDOMEN - 1 VIEW COMPARISON:  Same day. FINDINGS: Nasogastric tube tip is seen in expected position of proximal stomach. No abnormal bowel dilatation is noted. IMPRESSION: Nasogastric tube tip seen in expected position of proximal stomach. Electronically Signed   By: Marijo Conception M.D.   On: 08/02/2021 09:48   CT ABDOMINAL MASS BIOPSY  Result Date: 08/02/2021 CLINICAL DATA:  hx ovarian CA and enterocutaneous fistula, currently hospitalized due to SBO. CT on 12/29 showed multilocular fluid collections in the subcutaneous tissues of the anterior abdominal wall. EXAM: CT GUIDED NEEDLE ASPIRATE AND CORE BIOPSY OF ANTERIOR ABDOMINAL WALL COMPLEX MASS ANESTHESIA/SEDATION: Intravenous Fentanyl 48mcg and Versed 2mg  were administered as conscious sedation during continuous monitoring of the patient's level of consciousness and physiological / cardiorespiratory  status by the radiology RN, with a total moderate sedation time of 10 minutes. PROCEDURE: The procedure risks, benefits, and alternatives were explained to the patient. Questions regarding the procedure were encouraged and answered. The patient understands and consents to the procedure. Select axial scans through the mid abdomen obtained in the complex anterior abdominal process was localized. An appropriate skin site was determined and marked. The operative field was prepped with chlorhexidinein a sterile fashion, and a sterile drape was applied covering the operative field. A sterile gown and sterile gloves were used for the procedure. Local anesthesia was provided with 1% Lidocaine. Under CT fluoroscopic guidance, a 17 gauge trocar needle was advanced into the lesion. Approximately 4 mL of viscous purulent-appearing material were aspirated, sent for Gram stain and culture. The needle was positioned at the margin of the lesion. Once needle tip position was confirmed, coaxial 18-gauge core biopsy samples were obtained, obtaining 4 solid-appearing cores submitted in formalin to surgical pathology. The guide needle was removed. Postprocedure scans show no hemorrhage or other apparent complication. The patient tolerated the procedure well. COMPLICATIONS: None immediate FINDINGS: Complex lobular anterior abdominal wall process was localized. 4 mL purulent-appearing material aspirated from the lesion. Multiple core biopsy samples were obtained. IMPRESSION: 1. Technically successful CT-guided aspiration and biopsy of anterior abdominal wall process. Electronically Signed   By: Lucrezia Europe M.D.   On: 08/02/2021 14:47

## 2021-08-06 NOTE — Progress Notes (Signed)
GYN Oncology Progress Note  Sarah Newman is a 46 year old female currently admitted with a possible bowel obstruction and multilocular fluid collections in the subcutaneous space of the anterior abdominal wall. 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 The fistula was managed with bowel rest and TPN at that time. Following this, she has had recollection of a subcutaneous abscess requiring drainage.  On 08/02/2021, IR performed CT guided aspiration with biopsy of the anterior abodminal wall complex mass with 5 cc of purulent fluid sent for GS and C&C. Surg path is still pending. Culture results with preliminary results of MODERATE KLEBSIELLA OXYTOCA, MODERATE ESCHERICHIA COLI , MODERATE PROTEUS MIRABILIS, FEW STAPHYLOCOCCUS AUREUS. A CA 125 level was checked on 08/02/21 resulting 120 from 42.7 on previous draw.     Subjective: Patient reports having a great day yesterday and today. She has been tolerating liquids with no nausea or emesis. She does report abdominal discomfort in the upper abdomen with eating. She had a loose BM at 3 am on 08/05/2021 and is passing flatus. Ambulating without difficulty. She reported a feeling of relief when the collection in the upper abdomen opened and began draining on its on. She states the drainage has cleared up from pus-like. She feels she is able to attempt to drink the contrast for the CT entero. She reports feeling like she has her energy back after taking the potassium packets. All questions answered.  Objective: Vital signs in last 24 hours: Temp:  [98 F (36.7 C)-98.8 F (37.1 C)] 98.2  F (36.8 C) (01/03 0417) Pulse Rate:  [78-98] 84 (01/03 0417) Resp:  [16-18] 18 (01/03 0417) BP: (98-138)/(74-102) 98/88 (01/03 0417) SpO2:  [96 %-97 %] 96 % (01/03 0417) Last BM Date: 08/03/21  Intake/Output from previous day: 01/02 0701 - 01/03 0700 In: 240 [P.O.:240] Out: -   Physical Examination: General: alert, cooperative, and no distress Abdomen with decrease in palpable mass-like area in the upper abdomen. Minimal amount of drainage, more serosanguinous, small amount of pus-like drainage expressed with palpation. New dressing applied by Sarah Newman No lower extrem edema appreciated.   Labs: WBC/Hgb/Hct/Plts:  5.9/9.3/28.1/281 (01/03 0500) BUN/Cr/glu/ALT/AST/amyl/lip:  <5/0.52/--/16/19/--/-- (01/03 0500)  Assessment: 46 y.o. s/p IR drainage of upper abdominal collection: stable Pain:  Pain is well-controlled on PRN medications.  Heme: Hgb 9.3 and Hct 28.1-stable compared with previous values.   ID: WBC 5.9 this am from 9.9 yesterday. Currently on zosyn.   CV: BP and HR stable. Continue to monitor.   GI: Tolerating PO. Scopolamine patch ordered, additional antiemetics ordered if needed. Recommend GI consultation outpatient.   GU: Voiding without difficulty. Creatinine 0.52 this am.     FEN: No critical values noted. Mag 1.9 this am.  Prophylaxis: SCDs ordered.  Plan: Sarah Newman would like patient to proceed with CT entero today to further evaluate the area in the upper abdomen and to see if there is still a collection present that needs to be drained in the OR. Diet to regular diet as tolerated. Discussed plan for possible discharge as soon as tomorrow if doing well.  Dietician order placed.   LOS: 4 days    Sarah Newman 08/06/2021, 11:40 AM

## 2021-08-07 ENCOUNTER — Other Ambulatory Visit (HOSPITAL_COMMUNITY): Payer: Self-pay

## 2021-08-07 DIAGNOSIS — E440 Moderate protein-calorie malnutrition: Secondary | ICD-10-CM | POA: Insufficient documentation

## 2021-08-07 LAB — AEROBIC/ANAEROBIC CULTURE W GRAM STAIN (SURGICAL/DEEP WOUND): Gram Stain: NONE SEEN

## 2021-08-07 MED ORDER — AMOXICILLIN-POT CLAVULANATE 875-125 MG PO TABS
1.0000 | ORAL_TABLET | Freq: Two times a day (BID) | ORAL | 0 refills | Status: AC
Start: 2021-08-07 — End: 2021-08-17
  Filled 2021-08-07 (×2): qty 18, 9d supply, fill #0

## 2021-08-07 MED ORDER — AMOXICILLIN-POT CLAVULANATE 875-125 MG PO TABS
1.0000 | ORAL_TABLET | Freq: Two times a day (BID) | ORAL | Status: DC
  Administered 2021-08-07: 1 via ORAL
  Filled 2021-08-07: qty 1

## 2021-08-07 MED ORDER — OXYCODONE HCL 5 MG PO TABS
5.0000 mg | ORAL_TABLET | ORAL | 0 refills | Status: AC | PRN
  Filled 2021-08-07: qty 30, 5d supply, fill #0

## 2021-08-07 NOTE — Progress Notes (Addendum)
GYN Oncology Progress Note  S: Patient states she slept well last pm. She had a large episode of emesis after drinking oral contrast for CT scan. No nausea or emesis reported this am. Passing flatus. Has not had anything solid since before CT scan. No abdominal pain reported at this time. Ambulating without difficulty. Minimal drainage reported from the opening in the anterior abdomen. She reports feeling the upper abdomen protruding out when out of bed and ambulating.   O: Alert, oriented, resting in bed, in no acute distress. Lungs clear. Heart regular in rate and rhythm. Abdomen soft, active bowel sounds, palpable mass like area remains in the upper abdomen. Minimal amount of cloudy, gray drainage on gauze over wound opening. No lower extrem edema noted.  CT AP entero on 08/06/2021: 1. Interval mild increase in ascites compared with prior CT of 5 days ago. No focal extraluminal air or fluid collections are identified. 2. Persistent small bowel wall thickening and enhancement in the anterior abdomen with upstream small bowel dilatation suggesting residual partial small bowel obstruction. 3. Anterior abdominal wall fluid collections have decreased in size. 4. No significant solid visceral organ or vascular abnormalities identified.  A/P: 1) Continued bowel dysfunction intermittently given known severe adhesive disease. 2) No obvious radiographic evidence of persistent EC fistula. 3) Spontaneous draining subcutaneous abscess.  CT scan discussed. Per Dr. Berline Lopes, if patient is able to tolerate food this am along with oral antibiotics once transitioned, she can be discharged from a GYN ONC standpoint. Dr. Alvy Bimler will plan to see the patient outpatient on August 16, 2021 for follow up. Appreciate dietician seeing patient. Biopsy taken by IR still pending-GYN ONC will follow up on this. Continue plan of care per Hospitalist Team.

## 2021-08-07 NOTE — Discharge Summary (Addendum)
Physician Discharge Summary  Sarah Newman PRF:[id number] DOB: Jul 21, 1976 DOA: 08/01/2021  PCP: Heath Lark, MD  Admit date: 08/01/2021 Discharge date: 08/07/2021  Time spent: 25 minutes  Recommendations for Outpatient Follow-up:  Completed total 14 days of antibiotics ending 08/16/21 Outpatient labs in coordination with OB/GYN as well as with hematology Follow-up final sensitivities of culture-unlikely to be growing MRSA but antibiotics would have to be adjusted if that is the case  Discharge Diagnoses:  MAIN problem for hospitalization   Intractable nausea vomiting Enterocutaneous fistula  Please see below for itemized issues addressed in HOpsital- refer to other progress notes for clarity if needed  Discharge Condition: Fair  Diet recommendation: Regular 46 year old female with ovarian cancer and chronic nausea abdominal pain  Known history of enterocutaneous fistula while undergoing ex lap for lysis of adhesions 02/2021--hospitalized subsequently multiple times in August September and October 2022  Admitted to inpatient OB/GYN General surgery IR consulted and NG tube ordered-SBO protocol followed Underwent CT-guided aspiration/biopsy 12/30-deep cultures suggestive Klebsiella E. coli Proteus and staph aureus Fistula draining spontaneously with no plans for further aggressive management Patient was able to ultimately tolerate p.o. diet and was discharged home in a stable state She will need close follow-up with Dr. Simeon Craft such which has already been arranged  Her sensitivity should be followed up in the outpatient setting by either GYN oncology or medical oncology and addition of doxycycline should be considered if MRSA comes back in the culture    [facility] Weights   08/03/21 0500 08/03/21 0700 08/05/21 0635  Weight: 70.2 kg 72.4 kg 71 kg     Discharge Exam: Vitals:   08/06/21 2140 08/07/21 0539  BP: 123/84 111/84  Pulse: 91 (!) 101  Resp: 18 18  Temp: 98 F (36.7 C)  97.8 F (36.6 C)  SpO2: 97% 99%    Subj on day of d/c   Feels good tolerated a full breakfast no chest pain no fever Still concerned about swelling in her abdomen-I have deferred comment on this and asked that she contact her GYN surgeon She has no pain but when eating she does need some pain meds We refilled limited amount of her controlled substances  General Exam on discharge  EOMI NCAT no focal deficit CTA B no added sound Abdomen distended.  Scars noted firm area of around umbilicus No lower extremity edema Neurologically intact ROM intact  Discharge Instructions   Discharge Instructions     Discharge instructions   Complete by: As directed    Please call call MD or return to ER for similar or worsening recurring problem that brought you to hospital or if any fever,nausea/vomiting,abdominal pain, uncontrolled pain, chest pain,  shortness of breath or any other alarming symptoms.  Please follow-up your doctor as instructed in a week time and call the office for appointment.  Please avoid alcohol, smoking, or any other illicit substance and maintain healthy habits including taking your regular medications as prescribed.  You were cared for by a hospitalist during your hospital stay. If you have any questions about your discharge medications or the care you received while you were in the hospital after you are discharged, you can call the unit and ask to speak with the hospitalist on call if the hospitalist that took care of you is not available.  Once you are discharged, your primary care physician will handle any further medical issues. Please note that NO REFILLS for any discharge medications will be authorized once you are discharged, as it is  imperative that you return to your primary care physician (or establish a relationship with a primary care physician if you do not have one) for your aftercare needs so that they can reassess your need for medications and monitor your  lab values   Discharge wound care:   Complete by: As directed    Dry clean dressing to allow drainage PRN.   Increase activity slowly   Complete by: As directed       Allergies as of 08/07/2021       Reactions   Carboplatin Shortness Of Breath        Medication List     TAKE these medications    acetaminophen 500 MG tablet Commonly known as: TYLENOL Take 1,000 mg by mouth every 6 (six) hours as needed for headache (pain).   acetaminophen 650 MG suppository Commonly known as: TYLENOL Place 1 suppository (650 mg total) rectally every 6 (six) hours as needed for moderate pain.   amoxicillin-clavulanate 875-125 MG tablet Commonly known as: Augmentin Take 1 tablet by mouth 2 (two) times daily for 9 days.   COLACE PO Take 1 capsule by mouth daily.   dexamethasone 4 MG tablet Commonly known as: DECADRON Take 1/2 tablet (2 mg total) by mouth 2 (two) times daily.   famotidine 20 MG tablet Commonly known as: PEPCID Take 1 tablet (20 mg total) by mouth daily.   metoCLOPramide 10 MG tablet Commonly known as: REGLAN Take 1 tablet (10 mg total) by mouth every 8 (eight) hours as needed for nausea.   multivitamin with minerals Tabs tablet Take 1 tablet by mouth daily.   omeprazole 40 MG capsule Commonly known as: PRILOSEC Take 1 capsule by mouth daily.   ondansetron 4 MG disintegrating tablet Commonly known as: Zofran ODT Take 1 tablet (4 mg total) by mouth every 4 (four) hours as needed for up to 20 doses for nausea or vomiting.   ondansetron 8 MG tablet Commonly known as: ZOFRAN Take 1 tablet (8 mg total) by mouth every 8 (eight) hours as needed for nausea or vomiting.   oxyCODONE 5 MG immediate release tablet Commonly known as: Oxy IR/ROXICODONE Take 1-2 tablets (5-10 mg total) by mouth every 4 (four) hours as needed for severe pain or breakthrough pain. What changed:  how much to take reasons to take this   polyethylene glycol 17 g packet Commonly known as:  MIRALAX / GLYCOLAX Take 17 g by mouth daily as needed (constipation).   potassium chloride 20 MEQ packet Commonly known as: KLOR-CON Take 2 tablets by mouth once a day for 7 days.   prochlorperazine 10 MG tablet Commonly known as: COMPAZINE Take 1 tablet (10 mg total) by mouth every 8 (eight) hours as needed for nausea or vomiting. What changed: when to take this   promethazine 12.5 MG suppository Commonly known as: PHENERGAN Place 1 suppository (12.5 mg total) rectally every 8 (eight) hours as needed for up to 30 doses for nausea or vomiting.   scopolamine 1 MG/3DAYS Commonly known as: TRANSDERM-SCOP Place 1 patch (1.5 mg total) onto the skin behind the ear every 3 (three) days.   traMADol 50 MG tablet Commonly known as: ULTRAM Take 1 tablet (50 mg total) by mouth every 6 (six) hours as needed for up to 5 days for moderate pain.               Discharge Care Instructions  (From admission, onward)           Start  Ordered   08/06/21 0000  Discharge wound care:       Comments: Dry clean dressing to allow drainage PRN.   08/06/21 1105           Allergies  Allergen Reactions   Carboplatin Shortness Of Breath    Follow-up Information     Heath Lark, MD Follow up in 1 week(s).   Specialty: Hematology and Oncology Contact information: [place] [address] [phone]                  The results of significant diagnostics from this hospitalization (including imaging, microbiology, ancillary and laboratory) are listed below for reference.    Significant Diagnostic Studies: DG Abd 1 View  Result Date: 08/02/2021 CLINICAL DATA:  Nasogastric tube placement. EXAM: ABDOMEN - 1 VIEW COMPARISON:  Abdominal plain film dated May 04, 2021 and abdomen and pelvis CT dated August 01, 2021 FINDINGS: A nasogastric tube is seen with its distal tip looped within the gastric antrum. Dilated small bowel loops are seen within  the mid to upper abdomen. Radiopaque contrast is seen within the urinary bladder and bilateral renal collecting systems. Radiopaque surgical clips are noted within the right upper quadrant. No radio-opaque calculi or other significant radiographic abnormality are seen. IMPRESSION: Nasogastric tube positioning, as described above. Electronically Signed   By: Virgina Norfolk M.D.   On: 08/02/2021 02:57   CT ABDOMEN PELVIS W CONTRAST  Result Date: 08/01/2021 CLINICAL DATA:  Bowel obstruction, history of prior fistula EXAM: CT ABDOMEN AND PELVIS WITH CONTRAST TECHNIQUE: Multidetector CT imaging of the abdomen and pelvis was performed using the standard protocol following bolus administration of intravenous contrast. CONTRAST:  152mL OMNIPAQUE IOHEXOL 350 MG/ML SOLN COMPARISON:  07/08/2021 FINDINGS: Lower chest: No acute pleural or parenchymal lung disease. Hepatobiliary: Gallbladder is surgically absent. Postsurgical dilatation of the common bile duct measuring up to 11 mm. No focal liver abnormality. Pancreas: Unremarkable. No pancreatic ductal dilatation or surrounding inflammatory changes. Spleen: Normal in size without focal abnormality. Adrenals/Urinary Tract: Kidneys enhance normally and symmetrically. The adrenals are unremarkable. Bladder is completely decompressed, limiting its evaluation. Stomach/Bowel: There is dilation and hyperenhancement of the mucosa of the duodenum and proximal jejunum, measuring up to 3.7 cm in maximal diameter. The distal small bowel and colon are relatively decompressed. Transition point is in the region of prior abdominal wall fistula, consistent with adhesions. Multilocular fluid collections are seen within the anterior abdominal wall at the site of previous enterocutaneous fistula, measuring up to 2.0 cm. It is unclear whether these reflect recurrent fistula and bowel contents, evaluation limited without oral contrast. Multiple subcutaneous abscesses could also give this  appearance. Vascular/Lymphatic: No significant vascular findings are present. No enlarged abdominal or pelvic lymph nodes. Reproductive: Status post hysterectomy. No adnexal masses. Other: Trace upper abdominal ascites again noted. No free intra-abdominal gas. Musculoskeletal: No acute or destructive bony lesions. Reconstructed images demonstrate no additional findings. IMPRESSION: 1. Dilated hyperenhancing loops of proximal small bowel, consistent with obstruction. Transition point at the site of prior enterocutaneous fistula with multiple adhesions in the region of the umbilicus. 2. Multilocular fluid collections in the subcutaneous tissues of the anterior abdominal wall, at site of previous enterocutaneous fistula. Differential diagnosis includes recurrent fistula versus subcutaneous abscesses. Evaluation limited without oral contrast. 3. Trace ascites, stable. 4. Stable postsurgical dilatation of the common bile duct after cholecystectomy. Electronically Signed   By: Randa Ngo M.D.   On: 08/01/2021 23:53   CT Abdomen Pelvis  W Contrast  Result Date: 07/08/2021 CLINICAL DATA:  Persistent postprocedural fistula with new abdominal swelling, pain and firmness around the previous fistula drainage site. EXAM: CT ABDOMEN AND PELVIS WITH CONTRAST TECHNIQUE: Multidetector CT imaging of the abdomen and pelvis was performed using the standard protocol following bolus administration of intravenous contrast. CONTRAST:  75mL OMNIPAQUE IOHEXOL 350 MG/ML SOLN COMPARISON:  Multiple priors including most recent CT May 31, 2021 FINDINGS: Lower chest: No acute abnormality. Hepatobiliary: No suspicious hepatic lesion. Gallbladder surgically absent. Similar mild dilation of the biliary tree post cholecystectomy. Pancreas: No pancreatic ductal dilation or evidence of acute inflammation. Spleen: Normal in size without focal abnormality. Adrenals/Urinary Tract: Bilateral adrenal glands are unremarkable. No hydronephrosis. No  solid enhancing renal mass. Urinary bladder is unremarkable for degree of distension. Stomach/Bowel: No enteric contrast was administered. Stomach is unremarkable for degree of distension. No pathologic dilation of large or small bowel. Similar appearance of the prominent gas and fluid-filled loops of small bowel in the right hemiabdomen without discrete transition, additionally there is similar wall thickening of these loops of small bowel. There is mixed density fluid and nodularity in the anterior abdomen with multiple loops of bowel traversing this area, however delineation of these bowel loops is difficult without intraluminal contrast material. No walled off fluid collection. Vascular/Lymphatic: No abdominal aortic aneurysm. No pathologically enlarged abdominal or pelvic lymph nodes. Reproductive: Uterus is surgically absent. No suspicious adnexal mass. Other: Increased small volume of abdominopelvic free fluid soft tissue nodularity in the anterior abdomen along the prior fistulous tract appears similar to prior. No new walled off fluid collection. Musculoskeletal: No aggressive lytic or blastic lesion of bone. No acute osseous abnormality. IMPRESSION: 1. No significant change in appearance of the soft tissue nodularity in the anterior abdominal wall in the region of the prior fistulous tract, no new walled off fluid collection. 2. Similar appearance of the mixed density nodular area in the anterior abdomen, possibly reflecting loops of bowel, scarring and ascites however this area is difficult to delineate given the lack of oral contrast material. No intra abdominopelvic walled off fluid collection. 3. Increased small volume of abdominopelvic free fluid with similar peritoneal thickening. 4. Similar wall thickening of the prominent gas and fluid-filled loops of small bowel in the right hemiabdomen without evidence of high-grade obstruction. Electronically Signed   By: Dahlia Bailiff M.D.   On: 07/08/2021  18:53   CT ENTERO ABD/PELVIS W CONTAST  Result Date: 08/06/2021 CLINICAL DATA:  Abdominal pain and possible bowel obstruction. History ovarian cancer with enterocutaneous fistula. Patient underwent drainage of an abdominal fluid collection 05/03/2021. EXAM: CT ABDOMEN AND PELVIS WITH CONTRAST (ENTEROGRAPHY) TECHNIQUE: Multidetector CT of the abdomen and pelvis during bolus administration of intravenous contrast. Negative oral contrast was given. CONTRAST:  28mL OMNIPAQUE IOHEXOL 350 MG/ML SOLN COMPARISON:  Abdominopelvic CT 08/01/2021 and 07/08/2021. FINDINGS: Lower chest: Clear lung bases. No significant pleural or pericardial effusion. There is a small fluid-filled hiatal hernia. Hepatobiliary: The liver is normal in density without suspicious focal abnormality. Similar biliary dilatation status post cholecystectomy. The common hepatic duct measures up to 9 mm in diameter and tapers distally. No evidence of calcified intraductal calculus. Pancreas: Unremarkable. No pancreatic ductal dilatation or surrounding inflammatory changes. Spleen: Normal in size without focal abnormality. Adrenals/Urinary Tract: Both adrenal glands appear normal. The kidneys appear normal without evidence of urinary tract calculus, suspicious lesion or hydronephrosis. No bladder abnormalities are seen. Stomach/Bowel: Low-density enteric contrast was administered. There is high density contrast material throughout  the colon, extending to the rectum. No rectal tube is in place. The stomach appears unremarkable for its degree of distention. There is persistent moderate dilatation of the proximal to mid small bowel with relative decompression of the distal small bowel. There is irregular small bowel wall thickening and enhancement anteriorly in the central abdomen adjacent to inflammatory changes within the anterior abdominal wall. Previously demonstrated anterior abdominal wall fluid collections have decreased in size, largest remaining  collection measuring 2.5 x 1.4 cm on image 54/2. No focal extraluminal intra-abdominal fluid collections are identified, although ascites has mildly increased in volume compared with the prior CT. There is no extravasated positive enteric contrast or free intraperitoneal air. No focal colonic abnormalities are identified. Vascular/Lymphatic: There are no enlarged abdominal or pelvic lymph nodes. No significant vascular findings are identified. The portal, superior mesenteric and splenic veins are patent. Reproductive: Status post hysterectomy with stable soft tissue stranding in the pelvic fat. No discrete focal mass. Other: Improvement in previously demonstrated small fluid collections within the anterior abdominal wall. No gas is identified to suggest a recurrent fistula. No pneumoperitoneum. Musculoskeletal: No acute or significant osseous findings. Mild convex left lumbar scoliosis. IMPRESSION: 1. Interval mild increase in ascites compared with prior CT of 5 days ago. No focal extraluminal air or fluid collections are identified. 2. Persistent small bowel wall thickening and enhancement in the anterior abdomen with upstream small bowel dilatation suggesting residual partial small bowel obstruction. 3. Anterior abdominal wall fluid collections have decreased in size. 4. No significant solid visceral organ or vascular abnormalities identified. Electronically Signed   By: Richardean Sale M.D.   On: 08/06/2021 17:34   DG Abd 2 Views  Result Date: 08/06/2021 CLINICAL DATA:  46 year old female with ovarian cancer, enterocutaneous fistula, hospitalized due to small-bowel obstruction. Abdominal wall abscess. EXAM: ABDOMEN - 2 VIEW COMPARISON:  KUB 08/03/2021 and earlier. FINDINGS: Upright and supine views at 1038 hours. Stable cholecystectomy clips. Stable lung bases with mild elevation of the right hemidiaphragm. Enteric tube removed. Abnormal contour of what appears to be IV contrast excreted into the urinary  bladder in the central pelvis. Small volume retained oral contrast in nondilated ascending and descending large bowel, with other gas-filled small and large bowel loops in the abdomen and pelvis not significantly changed from imaging last month. Superimposed pelvic phleboliths. No acute osseous abnormality identified. IMPRESSION: 1. Lobulated, irregular contour of the urinary bladder presumably containing excreted IV contrast. This bladder irregularity is nonspecific but may be sequelae of ovarian cancer or cancer treatment. 2. Abnormal but nonobstructed appearing bowel gas pattern not significantly changed since 08/01/2021. Electronically Signed   By: Genevie Ann M.D.   On: 08/06/2021 10:37   DG Abd Portable 1V-Small Bowel Obstruction Protocol-initial, 8 hr delay  Result Date: 08/03/2021 CLINICAL DATA:  Small-bowel obstruction 8 hour delay. EXAM: PORTABLE ABDOMEN - 1 VIEW COMPARISON:  August 02, 2021 (2:59 p.m.) FINDINGS: There is stable nasogastric tube positioning. The bowel gas pattern is normal. Radiopaque contrast is seen throughout the ascending, transverse and descending colon. No dilated bowel loops are identified. Radiopaque surgical clips are seen within the right upper quadrant. No radio-opaque calculi or other significant radiographic abnormality are seen. IMPRESSION: Oral contrast seen throughout the ascending, transverse and descending colon, without visualized dilated bowel loops. Electronically Signed   By: Virgina Norfolk M.D.   On: 08/03/2021 04:33   DG Abd Portable 1V-Small Bowel Protocol-Position Verification  Result Date: 08/02/2021 CLINICAL DATA:  NG tube placement. EXAM: PORTABLE ABDOMEN -  1 VIEW COMPARISON:  Abdominal x-ray 08/02/2021. FINDINGS: Nasogastric tube tip is at the level of the gastroesophageal junction. The distal 2.5 cm of the catheter is folded upon itself. No dilated bowel loops are seen. Cholecystectomy clips are present. Visualized lung bases are clear. No  fractures are identified. IMPRESSION: 1. Enteric tube tip is at the level of the gastroesophageal junction in the distal portion of the tip is folded upon itself. Recommend repositioning. Electronically Signed   By: Ronney Asters M.D.   On: 08/02/2021 15:39   DG Abd Portable 1 View  Result Date: 08/02/2021 CLINICAL DATA:  Nasogastric tube placement. EXAM: PORTABLE ABDOMEN - 1 VIEW COMPARISON:  Same day. FINDINGS: Nasogastric tube tip is seen in expected position of proximal stomach. No abnormal bowel dilatation is noted. IMPRESSION: Nasogastric tube tip seen in expected position of proximal stomach. Electronically Signed   By: Marijo Conception M.D.   On: 08/02/2021 09:48   CT ABDOMINAL MASS BIOPSY  Result Date: 08/02/2021 CLINICAL DATA:  hx ovarian CA and enterocutaneous fistula, currently hospitalized due to SBO. CT on 12/29 showed multilocular fluid collections in the subcutaneous tissues of the anterior abdominal wall. EXAM: CT GUIDED NEEDLE ASPIRATE AND CORE BIOPSY OF ANTERIOR ABDOMINAL WALL COMPLEX MASS ANESTHESIA/SEDATION: Intravenous Fentanyl 42mcg and Versed 2mg  were administered as conscious sedation during continuous monitoring of the patient's level of consciousness and physiological / cardiorespiratory status by the radiology RN, with a total moderate sedation time of 10 minutes. PROCEDURE: The procedure risks, benefits, and alternatives were explained to the patient. Questions regarding the procedure were encouraged and answered. The patient understands and consents to the procedure. Select axial scans through the mid abdomen obtained in the complex anterior abdominal process was localized. An appropriate skin site was determined and marked. The operative field was prepped with chlorhexidinein a sterile fashion, and a sterile drape was applied covering the operative field. A sterile gown and sterile gloves were used for the procedure. Local anesthesia was provided with 1% Lidocaine. Under CT  fluoroscopic guidance, a 17 gauge trocar needle was advanced into the lesion. Approximately 4 mL of viscous purulent-appearing material were aspirated, sent for Gram stain and culture. The needle was positioned at the margin of the lesion. Once needle tip position was confirmed, coaxial 18-gauge core biopsy samples were obtained, obtaining 4 solid-appearing cores submitted in formalin to surgical pathology. The guide needle was removed. Postprocedure scans show no hemorrhage or other apparent complication. The patient tolerated the procedure well. COMPLICATIONS: None immediate FINDINGS: Complex lobular anterior abdominal wall process was localized. 4 mL purulent-appearing material aspirated from the lesion. Multiple core biopsy samples were obtained. IMPRESSION: 1. Technically successful CT-guided aspiration and biopsy of anterior abdominal wall process. Electronically Signed   By: Lucrezia Europe M.D.   On: 08/02/2021 14:47    Microbiology: Recent Results (from the past 240 hour(s))  Resp Panel by RT-PCR (Flu A&B, Covid) Nasopharyngeal Swab     Status: None   Collection Time: 08/02/21  3:28 AM   Specimen: Nasopharyngeal Swab; Nasopharyngeal(NP) swabs in vial transport medium  Result Value Ref Range Status   SARS Coronavirus 2 by RT PCR NEGATIVE NEGATIVE Final    Comment: (NOTE) SARS-CoV-2 target nucleic acids are NOT DETECTED.  The SARS-CoV-2 RNA is generally detectable in upper respiratory specimens during the acute phase of infection. The lowest concentration of SARS-CoV-2 viral copies this assay can detect is 138 copies/mL. A negative result does not preclude SARS-Cov-2 infection and should not be used as  the sole basis for treatment or other patient management decisions. A negative result may occur with  improper specimen collection/handling, submission of specimen other than nasopharyngeal swab, presence of viral mutation(s) within the areas targeted by this assay, and inadequate number of  viral copies(<138 copies/mL). A negative result must be combined with clinical observations, patient history, and epidemiological information. The expected result is Negative.  Fact Sheet for Patients:  EntrepreneurPulse.com.au  Fact Sheet for Healthcare Providers:  IncredibleEmployment.be  This test is no t yet approved or cleared by the [place] FDA and  has been authorized for detection and/or diagnosis of SARS-CoV-2 by FDA under an Emergency Use Authorization (EUA). This EUA will remain  in effect (meaning this test can be used) for the duration of the COVID-19 declaration under Section 564(b)(1) of the Act, 21 U.S.C.section 360bbb-3(b)(1), unless the authorization is terminated  or revoked sooner.       Influenza A by PCR NEGATIVE NEGATIVE Final   Influenza B by PCR NEGATIVE NEGATIVE Final    Comment: (NOTE) The Xpert Xpress SARS-CoV-2/FLU/RSV plus assay is intended as an aid in the diagnosis of influenza from Nasopharyngeal swab specimens and should not be used as a sole basis for treatment. Nasal washings and aspirates are unacceptable for Xpert Xpress SARS-CoV-2/FLU/RSV testing.  Fact Sheet for Patients: EntrepreneurPulse.com.au  Fact Sheet for Healthcare Providers: IncredibleEmployment.be  This test is not yet approved or cleared by the [place] FDA and has been authorized for detection and/or diagnosis of SARS-CoV-2 by FDA under an Emergency Use Authorization (EUA). This EUA will remain in effect (meaning this test can be used) for the duration of the COVID-19 declaration under Section 564(b)(1) of the Act, 21 U.S.C. section 360bbb-3(b)(1), unless the authorization is terminated or revoked.  Performed at [facility], [place] [address] [place], [place] [address]   Aerobic/Anaerobic Culture w Gram Stain (surgical/deep wound)     Status: None (Preliminary result)    Collection Time: 08/02/21  2:25 PM   Specimen: Tissue  Result Value Ref Range Status   Specimen Description TISSUE  Final   Special Requests NONE  Final   Gram Stain   Final    NO SQUAMOUS EPITHELIAL CELLS SEEN FEW WBC SEEN RARE GRAM POSITIVE COCCI    Culture   Final    MODERATE KLEBSIELLA OXYTOCA MODERATE ESCHERICHIA COLI MODERATE PROTEUS MIRABILIS FEW STAPHYLOCOCCUS AUREUS SUSCEPTIBILITIES TO FOLLOW CRITICAL RESULT CALLED TO, READ BACK BY AND VERIFIED WITH: RN KAITLYN B . [phone] [id number] FCP NO ANAEROBES ISOLATED; CULTURE IN PROGRESS FOR 5 DAYS    Report Status PENDING  Incomplete   Organism ID, Bacteria KLEBSIELLA OXYTOCA  Final   Organism ID, Bacteria ESCHERICHIA COLI  Final   Organism ID, Bacteria PROTEUS MIRABILIS  Final      Susceptibility   Escherichia coli - MIC*    AMPICILLIN 8 SENSITIVE Sensitive     CEFAZOLIN <=4 SENSITIVE Sensitive     CEFEPIME <=0.12 SENSITIVE Sensitive     CEFTAZIDIME <=1 SENSITIVE Sensitive     CEFTRIAXONE <=0.25 SENSITIVE Sensitive     CIPROFLOXACIN <=0.25 SENSITIVE Sensitive     GENTAMICIN <=1 SENSITIVE Sensitive     IMIPENEM <=0.25 SENSITIVE Sensitive     TRIMETH/SULFA <=20 SENSITIVE Sensitive     AMPICILLIN/SULBACTAM <=2 SENSITIVE Sensitive     PIP/TAZO <=4 SENSITIVE Sensitive     * MODERATE ESCHERICHIA COLI   Klebsiella oxytoca - MIC*    AMPICILLIN >=32 RESISTANT Resistant     CEFAZOLIN 16  SENSITIVE Sensitive     CEFEPIME <=0.12 SENSITIVE Sensitive     CEFTAZIDIME <=1 SENSITIVE Sensitive     CEFTRIAXONE <=0.25 SENSITIVE Sensitive     CIPROFLOXACIN <=0.25 SENSITIVE Sensitive     GENTAMICIN <=1 SENSITIVE Sensitive     IMIPENEM <=0.25 SENSITIVE Sensitive     TRIMETH/SULFA <=20 SENSITIVE Sensitive     AMPICILLIN/SULBACTAM 8 SENSITIVE Sensitive     PIP/TAZO <=4 SENSITIVE Sensitive     * MODERATE KLEBSIELLA OXYTOCA   Proteus mirabilis - MIC*    AMPICILLIN <=2 SENSITIVE Sensitive     CEFAZOLIN <=4 SENSITIVE Sensitive     CEFEPIME <=0.12  SENSITIVE Sensitive     CEFTAZIDIME <=1 SENSITIVE Sensitive     CEFTRIAXONE <=0.25 SENSITIVE Sensitive     CIPROFLOXACIN <=0.25 SENSITIVE Sensitive     GENTAMICIN <=1 SENSITIVE Sensitive     IMIPENEM 1 SENSITIVE Sensitive     TRIMETH/SULFA <=20 SENSITIVE Sensitive     AMPICILLIN/SULBACTAM <=2 SENSITIVE Sensitive     PIP/TAZO Value in next row Sensitive      <=4 SENSITIVEPerformed at [facility], [address] [place], [place] [address]    * MODERATE PROTEUS MIRABILIS     Labs: Basic Metabolic Panel: Recent Labs  Lab 08/02/21 0524 08/03/21 5284 08/04/21 0548 08/05/21 0615 08/06/21 0500  NA 131* 133* 135 134* 136  K 3.3* 3.4* 3.2* 3.2* 3.6  CL 98 101 104 102 104  CO2 23 21* 23 23 24   GLUCOSE 116* 109* 139* 92 100*  BUN 9 <5* <5* <5* <5*  CREATININE 0.42* 0.40* 0.31* 0.40* 0.52  CALCIUM 8.5* 8.4* 8.3* 8.5* 8.4*  MG 1.6*  --  1.7 1.8 1.9  PHOS  --   --  2.0*  --   --    Liver Function Tests: Recent Labs  Lab 08/02/21 0524 08/03/21 0638 08/04/21 0548 08/05/21 0615 08/06/21 0500  AST 13* 13* 13* 18 19  ALT 13 13 12 15 16   ALKPHOS 60 63 62 64 65  BILITOT 1.0 1.0 0.7 0.7 0.4  PROT 6.4* 6.4* 6.1* 5.9* 5.9*  ALBUMIN 3.1* 3.2* 2.9* 2.9* 2.8*   Recent Labs  Lab 08/01/21 2137  LIPASE 42   No results for input(s): AMMONIA in the last 168 hours. CBC: Recent Labs  Lab 08/03/21 0638 08/03/21 1714 08/04/21 0548 08/05/21 0615 08/06/21 0500  WBC 9.2 9.6 8.3 9.9 5.9  HGB 10.0* 10.1* 9.1* 9.2* 9.3*  HCT 31.2* 31.0* 28.9* 28.5* 28.1*  MCV 84.1 83.1 85.0 82.4 82.2  PLT 292 313 283 277 281   Cardiac Enzymes: No results for input(s): CKTOTAL, CKMB, CKMBINDEX, TROPONINI in the last 168 hours. BNP: BNP (last 3 results) No results for input(s): BNP in the last 8760 hours.  ProBNP (last 3 results) No results for input(s): PROBNP in the last 8760 hours.  CBG: No results for input(s): GLUCAP in the last 168 hours.     Signed:  Nita Sells MD    [facility] 08/07/2021, 11:52 AM

## 2021-08-07 NOTE — Plan of Care (Signed)
?  Problem: Elimination: ?Goal: Will not experience complications related to bowel motility ?Outcome: Progressing ?  ?Problem: Pain Managment: ?Goal: General experience of comfort will improve ?Outcome: Progressing ?  ?Problem: Safety: ?Goal: Ability to remain free from injury will improve ?Outcome: Progressing ?  ?

## 2021-08-07 NOTE — Progress Notes (Addendum)
Pt discharged home with all belongings. Discharge education completed with pt, no questions or concerns at this time. Encouraged pt to follow up with PCP should any needs arise following discharge.

## 2021-08-08 ENCOUNTER — Other Ambulatory Visit (HOSPITAL_COMMUNITY): Payer: Self-pay

## 2021-08-08 ENCOUNTER — Telehealth: Payer: Self-pay | Admitting: Gynecologic Oncology

## 2021-08-08 ENCOUNTER — Encounter: Payer: Self-pay | Admitting: Oncology

## 2021-08-08 ENCOUNTER — Other Ambulatory Visit: Payer: Self-pay | Admitting: Gynecologic Oncology

## 2021-08-08 ENCOUNTER — Telehealth: Payer: Self-pay | Admitting: Oncology

## 2021-08-08 DIAGNOSIS — K56600 Partial intestinal obstruction, unspecified as to cause: Secondary | ICD-10-CM

## 2021-08-08 DIAGNOSIS — C801 Malignant (primary) neoplasm, unspecified: Secondary | ICD-10-CM

## 2021-08-08 LAB — SURGICAL PATHOLOGY

## 2021-08-08 NOTE — Telephone Encounter (Signed)
Called Sarah Newman and advised her of the biopsy results.  Discussed that we are going to present her case at the [facility] on Monday and will let her know the recommended treatment plan.  Advised her to call if she has any questions or needs.

## 2021-08-08 NOTE — Telephone Encounter (Signed)
Called patient to discuss recent biopsy. 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 Plan to review on Monday at tumor board. Referral being sent today for STAT GI evaluation. All questions answered.  Jeral Pinch MD Gynecologic Oncology

## 2021-08-10 ENCOUNTER — Other Ambulatory Visit (HOSPITAL_COMMUNITY): Payer: Self-pay

## 2021-08-12 ENCOUNTER — Other Ambulatory Visit (HOSPITAL_COMMUNITY): Payer: Self-pay

## 2021-08-13 ENCOUNTER — Other Ambulatory Visit (HOSPITAL_COMMUNITY): Payer: Self-pay

## 2021-08-14 ENCOUNTER — Telehealth: Payer: Self-pay | Admitting: Oncology

## 2021-08-14 NOTE — Telephone Encounter (Signed)
I do not have any specific additional recommendations Continue scopolamine patches

## 2021-08-14 NOTE — Telephone Encounter (Signed)
Called Sarah Newman and notified her of the [facility] on 08/28/21 at 2:00.  She mentioned that she is having a hard time eating and is throwing up everything including water.  She is using the scopolamine patches. Advised her that I will notify Dr. Alvy Bimler and call her back with any recommendations.

## 2021-08-14 NOTE — Telephone Encounter (Signed)
Called Alexsys back and let her know to continue using the scopolamine patches.  She asked if she could also take Zofran while using the patches.

## 2021-08-15 NOTE — Telephone Encounter (Signed)
yes

## 2021-08-15 NOTE — Telephone Encounter (Signed)
Called Olga back and advised her she can take Zofran while using the scopolamine patches.

## 2021-08-16 ENCOUNTER — Inpatient Hospital Stay (HOSPITAL_BASED_OUTPATIENT_CLINIC_OR_DEPARTMENT_OTHER): Payer: Medicaid Other | Admitting: Gynecologic Oncology

## 2021-08-16 ENCOUNTER — Emergency Department (HOSPITAL_COMMUNITY)
Admission: EM | Admit: 2021-08-16 | Discharge: 2021-08-16 | Discharge status: Against Medical Advice | Payer: Medicaid Other | Source: Home / Self Care

## 2021-08-16 ENCOUNTER — Telehealth: Payer: Self-pay | Admitting: *Deleted

## 2021-08-16 ENCOUNTER — Other Ambulatory Visit: Payer: Self-pay

## 2021-08-16 ENCOUNTER — Encounter: Payer: Self-pay | Admitting: Gynecologic Oncology

## 2021-08-16 ENCOUNTER — Inpatient Hospital Stay: Payer: Medicaid Other | Attending: Gynecologic Oncology

## 2021-08-16 ENCOUNTER — Encounter: Payer: Self-pay | Admitting: Hematology and Oncology

## 2021-08-16 ENCOUNTER — Inpatient Hospital Stay (HOSPITAL_BASED_OUTPATIENT_CLINIC_OR_DEPARTMENT_OTHER): Payer: Medicaid Other | Admitting: Hematology and Oncology

## 2021-08-16 VITALS — BP 117/94 | HR 110 | Temp 99.1°F | Resp 18

## 2021-08-16 DIAGNOSIS — R000 Tachycardia, unspecified: Secondary | ICD-10-CM | POA: Insufficient documentation

## 2021-08-16 DIAGNOSIS — K632 Fistula of intestine: Secondary | ICD-10-CM | POA: Insufficient documentation

## 2021-08-16 DIAGNOSIS — Z79899 Other long term (current) drug therapy: Secondary | ICD-10-CM | POA: Diagnosis not present

## 2021-08-16 DIAGNOSIS — R634 Abnormal weight loss: Secondary | ICD-10-CM | POA: Diagnosis not present

## 2021-08-16 DIAGNOSIS — K56600 Partial intestinal obstruction, unspecified as to cause: Secondary | ICD-10-CM | POA: Insufficient documentation

## 2021-08-16 DIAGNOSIS — C786 Secondary malignant neoplasm of retroperitoneum and peritoneum: Secondary | ICD-10-CM | POA: Diagnosis not present

## 2021-08-16 DIAGNOSIS — M1990 Unspecified osteoarthritis, unspecified site: Secondary | ICD-10-CM | POA: Diagnosis not present

## 2021-08-16 DIAGNOSIS — R222 Localized swelling, mass and lump, trunk: Secondary | ICD-10-CM | POA: Diagnosis not present

## 2021-08-16 DIAGNOSIS — R627 Adult failure to thrive: Secondary | ICD-10-CM | POA: Diagnosis not present

## 2021-08-16 DIAGNOSIS — Z8616 Personal history of COVID-19: Secondary | ICD-10-CM | POA: Insufficient documentation

## 2021-08-16 DIAGNOSIS — C562 Malignant neoplasm of left ovary: Secondary | ICD-10-CM

## 2021-08-16 DIAGNOSIS — Z90710 Acquired absence of both cervix and uterus: Secondary | ICD-10-CM | POA: Insufficient documentation

## 2021-08-16 DIAGNOSIS — C269 Malignant neoplasm of ill-defined sites within the digestive system: Secondary | ICD-10-CM

## 2021-08-16 DIAGNOSIS — R112 Nausea with vomiting, unspecified: Secondary | ICD-10-CM | POA: Diagnosis not present

## 2021-08-16 DIAGNOSIS — R1010 Upper abdominal pain, unspecified: Secondary | ICD-10-CM | POA: Diagnosis not present

## 2021-08-16 DIAGNOSIS — R1114 Bilious vomiting: Secondary | ICD-10-CM | POA: Insufficient documentation

## 2021-08-16 DIAGNOSIS — E860 Dehydration: Secondary | ICD-10-CM | POA: Diagnosis not present

## 2021-08-16 DIAGNOSIS — C561 Malignant neoplasm of right ovary: Secondary | ICD-10-CM | POA: Diagnosis not present

## 2021-08-16 DIAGNOSIS — E43 Unspecified severe protein-calorie malnutrition: Secondary | ICD-10-CM | POA: Diagnosis not present

## 2021-08-16 DIAGNOSIS — C7989 Secondary malignant neoplasm of other specified sites: Secondary | ICD-10-CM | POA: Insufficient documentation

## 2021-08-16 DIAGNOSIS — R531 Weakness: Secondary | ICD-10-CM | POA: Diagnosis not present

## 2021-08-16 LAB — CBC WITH DIFFERENTIAL (CANCER CENTER ONLY)
HCT: UNDETERMINED % (ref 36.0–46.0)
Hemoglobin: UNDETERMINED g/dL (ref 12.0–15.0)
MCH: UNDETERMINED pg (ref 26.0–34.0)
MCHC: UNDETERMINED g/dL (ref 30.0–36.0)
MCV: UNDETERMINED fL (ref 80.0–100.0)
Platelet Count: UNDETERMINED 10*3/uL (ref 150–400)
RBC: UNDETERMINED MIL/uL (ref 3.87–5.11)
RDW: UNDETERMINED % (ref 11.5–15.5)
WBC Count: UNDETERMINED 10*3/uL (ref 4.0–10.5)
nRBC: UNDETERMINED % (ref 0.0–0.2)

## 2021-08-16 LAB — CMP (CANCER CENTER ONLY)
ALT: 43 U/L (ref 0–44)
AST: 34 U/L (ref 15–41)
Albumin: 4.1 g/dL (ref 3.5–5.0)
Alkaline Phosphatase: 103 U/L (ref 38–126)
Anion gap: 17 — ABNORMAL HIGH (ref 5–15)
BUN: 12 mg/dL (ref 6–20)
CO2: 20 mmol/L — ABNORMAL LOW (ref 22–32)
Calcium: 9.4 mg/dL (ref 8.9–10.3)
Chloride: 101 mmol/L (ref 98–111)
Creatinine: 0.54 mg/dL (ref 0.44–1.00)
GFR, Estimated: >60 mL/min (ref >60)
Glucose, Bld: 134 mg/dL — ABNORMAL HIGH (ref 70–99)
Potassium: 3.7 mmol/L (ref 3.5–5.1)
Sodium: 138 mmol/L (ref 135–145)
Total Bilirubin: 0.5 mg/dL (ref 0.3–1.2)
Total Protein: 7.3 g/dL (ref 6.5–8.1)

## 2021-08-16 NOTE — Assessment & Plan Note (Signed)
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

## 2021-08-16 NOTE — Assessment & Plan Note (Addendum)
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 of upper GI tract Her case was recently reviewed at GYN oncology tumor board and GI referral is in progress She is too weak to undergo outpatient GI evaluation 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

## 2021-08-16 NOTE — Progress Notes (Signed)
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[place]) 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 of upper GI tract Her case was recently reviewed at GYN oncology tumor board and GI referral is in progress She is too weak to undergo outpatient GI evaluation 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   Partial small bowel obstruction ([place]) Her clinical exam is compatible with recurrent small bowel obstruction I recommend the patient to present to the ER for urgent evaluation and management  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   All questions were answered. The patient knows to call the clinic with any problems, questions or concerns. The total time spent in the appointment was 40 minutes encounter with patients including review of chart and various tests results, discussions about plan of care and coordination of care plan   Heath Lark, MD 08/16/2021 3:13 PM  INTERVAL HISTORY: Please see below for problem oriented charting. she returns for further evaluation after recent hospital discharge Over the last few days, she has difficulties tolerating oral intake with severe nausea and vomiting She continues to have ongoing drainage from her abdominal wall She is very weak and have lost more weight since last time I saw her  REVIEW OF SYSTEMS:   Constitutional: Denies fevers, chills  Eyes: Denies blurriness of vision Ears, nose, mouth, throat, and face: Denies mucositis or sore throat Respiratory: Denies cough, dyspnea or wheezes Skin: Denies abnormal skin rashes Lymphatics: Denies new lymphadenopathy or easy bruising Neurological:Denies numbness, tingling or new weaknesses Behavioral/Psych: Mood is stable, no new changes  All other systems were reviewed with the patient and are negative.  I have reviewed the past medical history, past surgical history, social history and family history with the patient and they are unchanged from previous note.  ALLERGIES:  is allergic to carboplatin.  MEDICATIONS:  Current Outpatient Medications  Medication Sig Dispense Refill   acetaminophen (TYLENOL) 500 MG tablet Take 1,000 mg by mouth every 6 (six) hours as needed for headache (pain).  acetaminophen (TYLENOL) 650 MG suppository Place 1 suppository (650 mg total) rectally every 6 (six) hours as needed for moderate pain. 15 suppository 2    amoxicillin-clavulanate (AUGMENTIN) 875-125 MG tablet Take 1 tablet by mouth 2 times daily for 9 days. 18 tablet 0   dexamethasone (DECADRON) 4 MG tablet Take 1/2 tablet (2 mg total) by mouth 2 (two) times daily. 28 tablet 0   Docusate Sodium (COLACE PO) Take 1 capsule by mouth daily.     famotidine (PEPCID) 20 MG tablet Take 1 tablet (20 mg total) by mouth daily. 30 tablet 3   metoCLOPramide (REGLAN) 10 MG tablet Take 1 tablet (10 mg total) by mouth every 8 (eight) hours as needed for nausea. 30 tablet 1   Multiple Vitamin (MULTIVITAMIN WITH MINERALS) TABS tablet Take 1 tablet by mouth daily.     omeprazole (PRILOSEC) 40 MG capsule Take 1 capsule by mouth daily. 30 capsule 9   ondansetron (ZOFRAN ODT) 4 MG disintegrating tablet Take 1 tablet (4 mg total) by mouth every 4 (four) hours as needed for up to 20 doses for nausea or vomiting. 20 tablet 0   ondansetron (ZOFRAN) 8 MG tablet Take 1 tablet (8 mg total) by mouth every 8 (eight) hours as needed for nausea or vomiting. 30 tablet 1   oxyCODONE (OXY IR/ROXICODONE) 5 MG immediate release tablet Take 1-2 tablets by mouth every 4 hours as needed for severe pain or breakthrough pain. 30 tablet 0   polyethylene glycol (MIRALAX / GLYCOLAX) 17 g packet Take 17 g by mouth daily as needed (constipation).     potassium chloride (KLOR-CON) 20 MEQ packet Take 2 packets as directed by mouth once a day for 7 days. 14 packet 0   prochlorperazine (COMPAZINE) 10 MG tablet Take 1 tablet (10 mg total) by mouth every 8 (eight) hours as needed for nausea or vomiting. (Patient taking differently: Take 10 mg by mouth 2 (two) times daily.) 30 tablet 0   promethazine (PHENERGAN) 12.5 MG suppository Place 1 suppository (12.5 mg total) rectally every 8 (eight) hours as needed for up to 30 doses for nausea or vomiting. 30 each 0   scopolamine (TRANSDERM-SCOP) 1 MG/3DAYS Place 1 patch (1.5 mg total) onto the skin behind the ear every 3 (three) days. 10 patch 0   No current  facility-administered medications for this visit.    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 There is a 16 cm complex cystic mass in the pelvis containing thickened septations located near midline, abutting the superior aspect of the uterus. I suspect this mass is adnexal/ovarian in origin rather than uterine in origin. Specifically, I suspect the mass likely arises from the left ovary/adnexa and is most consistent with a neoplasm. Malignancy is certainly not excluded on this study. Recommend a pelvic ultrasound for further evaluation. 2. The rounded more solid-appearing structure in the right side of the pelvis is probably the right ovary. Recommend attention on ultrasound. 3. The small amount of fluid in the pelvis is likely reactive to the complex cystic mass likely rising from the left ovary/adnexa. 4. No cause for blood in vomit or black stools identified. No convincing evidence of a perforated or inflamed gastric or duodenal ulcer. 5. Hepatic steatosis.   07/02/2019 Imaging   MRI pelvis 1. There is a large (15 cm) solid and cystic  mass which appears to be arising from the left ovary concerning for cystic ovarian neoplasm. There are 2 enhancing nodules within the central upper abdomen raising the possibility of peritoneal carcinomatosis. Additionally, there is a small amount fluid within the pelvis. Malignant ascites not excluded. 2. Fibroid uterus. 3. Hepatic steatosis.   07/02/2019 Imaging   US pelvis 1. There is a large mass in the pelvis also seen on CT imaging. A separate left ovary is not visualized. 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 The mass is suspicious for malignancy. Recommend gynecologic consultation. 2. The right ovary demonstrates an irregular ill-defined hypoechoic  hypervascular region centrally. The findings are nonspecific in the right ovary. However, given the apparent left ovarian neoplasm suspicious for malignancy, recommend an MRI to further assess the right ovary. 3. Uterine fibroid measuring 3.6 cm.     07/21/2019 Pathology Results   FINAL MICROSCOPIC DIAGNOSIS: A. 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 - See oncology table. B. OMENTUM, RESECTION: - Omental lymph node with metastatic carcinoma (1/1). - Omental adipose tissue with foci of inflammation and reactive mesothelial changes. C. FALLOPIAN TUBE, LEFT, RESECTION: - Fallopian tube with focal, mild inflammation and fibrosis. - No tumor identified. D. UTERUS, CERVIX, RIGHT TUBE AND OVARY: - Cervix Nabothian cyst and squamous metaplasia. - Endometrium Proliferative. No hyperplasia or carcinoma. - Myometrium Leiomyomata. No malignancy. -Right ovary Poorly differentiated carcinoma, 4.8 cm. See oncology table and comment. -Right Fallopian tube Benign paratubal cyst. No endometriosis or malignancy. ONCOLOGY TABLE: OVARY or FALLOPIAN TUBE or PRIMARY PERITONEUM: Procedure: Bilateral f-oophorectomy, hysterectomy and omentectomy. Specimen Integrity: Intact Tumor Site: Left ovary and right ovary. See comment. Ovarian Surface Involvement: Not identified. Fallopian Tube Surface Involvement: Not identified. Tumor Size: Left ovary: 18.6 x 16.0 x 7.2 cm. Right ovary: 4.8 x 3.2 x 3.2 cm. Histologic Type: Clear cell carcinoma. See comment. Histologic Grade: High-grade. Other Tissue/ Organ Involvement: Omental lymph node with metastatic carcinoma. Peritoneal/Ascitic Fluid: Pending. Treatment Effect: No known presurgical therapy. Pathologic Stage Classification (pTNM, AJCC 8th Edition): pT3a, pN1a. Representative Tumor Block: A2, A3, A4, A5 A6, A7 and A8. Comment(s): The left ovarian tumor is 18.6 cm in greatest dimension and has features of clear  cell carcinoma. The right ovarian tumor is 4.8 cm in greatest dimension and is poorly differentiated with focal features consistent with clear cell carcinoma. The pattern of involvement suggests that the right ovarian tumor is likely metastatic from the larger left ovarian clear cell carcinoma. Sections of the omentum show a lymph node with metastatic carcinoma. Sections of the remainder of the omentum do not show involvement by carcinoma.   07/21/2019 Surgery   Procedure(s) Performed: Exploratory laparotomy with radical tumor debulking including total hysterectomy, bilateral salpingo-oophorectomy, infra-gastric omentectomy, and washings.   Surgeon: Jeral Pinch, MD    Operative Findings: On EUA, mobile uterus and ovarian mass, situated out of the pelvis, spanning 6-8 cm above the umbilicus.  On intra-abdominal entry, inflamed omentum adherent to much of the 16 cm left ovarian mass, adhesions easily lysed bluntly.  No nodularity of the ovarian mass however on delivery of the mass through the midline incision, there was Intra-Op rupture of one of the smaller cystic components with drainage of clear brown-tinged fluid.  Grossly normal-appearing left fallopian tube as well as right tube.  Right ovary mildly enlarged measuring approximately 4 cm and firm/nodular, suspicious for tumor involvement.  Uterus 8-10 cm with 4 cm anterior mid body fibroid.  Some very small volume miliary disease along right pelvic sidewall, removed with uterine specimen.  Evidence of small diverticular disease.  Small bowel normal appearing although multiple loops of bowel adherent to each other, especially by mesentery, and an inflammatory manner.  An approximately 2 x 2 centimeter nodule suspicious for tumor implant was found in the omentum just inferior to the greater curvature of the stomach.  Additional small (less than 5 mm) implants noted on the stomach and posterior aspect of the left lobe of the liver.  Otherwise the liver  surface and diaphragm were smooth.   08/03/2019 Cancer Staging   Staging form: Ovary, Fallopian Tube, and Primary Peritoneal Carcinoma, AJCC 8th Edition - Pathologic: Stage IIIA2 (pT3a, pN1, cM0) - Signed by Heath Lark, MD on 08/03/2019    08/12/2019 Procedure   Placement of single lumen port a cath via right internal jugular vein. The catheter tip lies at the cavo-atrial junction. A power injectable port a cath was placed and is ready for immediate use.   08/22/2019 - 12/05/2019 Chemotherapy   The patient had carboplatin and taxol for chemotherapy treatment.     09/23/2019 Genetic Testing   Negative genetic testing:  No pathogenic variants detected on the [facility] TumorNext-HRD+CancerNext paired germline and somatic genetic test. The report date is 09/23/2019.  The TumorNext-HRD+CancerNext test offered by [facility] includes paired germline and tumor analyses of 11 genes associated with homologous recombination repair (ATM, BARD1, BRIP1, CHEK2, MRE11A, NBN, PALB2, RAD51C, RAD51D, BRCA1, BRCA2) plus germline analyses of 26 additional genes associated with hereditary cancer (APC, AXIN2, BMPR1A, CDH1, CDK4, CDKN2A, DICER1, HOXB13, EPCAM, GREM1, MLH1, MSH2, MSH3, MSH6, MUTYH, NF1, NTHL1, PMS2, POLD1, POLE, PTEN, RECQL, SMAD4, SMARCA4, STK11, and TP53).   11/14/2019 Tumor Marker   Patient's tumor was tested for the following markers: CA-125 Results of the tumor marker test revealed 10.2.   12/05/2019 Tumor Marker   Patient's tumor was tested for the following markers: CA-125 Results of the tumor marker test revealed 9.9   01/05/2020 Imaging   No specific findings of active malignancy. Interval resection of the pelvic mass. Subtle nodularity along the right side of the vaginal cuff merits surveillance.     01/05/2020 Tumor Marker   Patient's tumor was tested for the following markers: CA-125 Results of the tumor marker test revealed 9.7   02/17/2020 Tumor Marker   Patient's tumor was tested for the  following markers: CA-125 Results of the tumor marker test revealed 9.6   04/05/2020 Imaging   1. There is some minimal stranding along the mesentery and omentum without overt omental caking or well-defined nodularity. Faint bandlike density along the anterior peritoneal reflection in the pelvis, slightly more notable than on prior. Overall the appearance is not considered specific for recurrence but given the slight increase in prominence from 01/05/2020, would encourage careful surveillance by tumor markers and imaging. 2. Mild lower lumbar degenerative facet arthropathy.   04/05/2020 Tumor Marker   Patient's tumor was tested for the following markers: CA-125 Results of the tumor marker test revealed 11   05/29/2020 Tumor Marker   Patient's tumor was tested for the following markers: CA-125. Results of the tumor marker test revealed 17.   07/04/2020 Tumor Marker   Patient's tumor was tested for the following markers: CA-125. Results of the tumor marker test revealed 20.1   07/16/2020 Imaging   Stable mild stranding throughout mesenteric and omental fat, without evidence of discrete masses or ascites. No evidence of new or progressive disease.   08/29/2020  Tumor Marker   Patient's tumor was tested for the following markers: CA-125 Results of the tumor marker test revealed 32.7.   09/07/2020 Imaging   1. Interval progression of peritoneal disease within the abdomen and pelvis. Tumor predominantly involves the serosal surface of the large and small bowel loops. No signs of bowel obstruction identified at this time. 2. No evidence for metastatic disease to the chest.   09/19/2020 Imaging   1. No acute findings identified within the abdomen or pelvis. No evidence for bowel obstruction. 2. Similar appearance of soft tissue thickening along the peritoneal reflections within the abdomen and pelvis compatible with known peritoneal disease. Recently characterized increased soft tissue along the  surface of the jejunal bowel loops and tethering of pelvic small bowel loops is difficult to quantify (and compared with previous exam) due to lack of IV contrast material.     09/20/2020 - 12/25/2020 Chemotherapy    Patient is on Treatment Plan: OVARIAN RECURRENT 3RD LINE CARBOPLATIN D1 / GEMCITABINE D1,8 (4/800) Q21D  Carboplatin was discontinued after 10/15/2020 due to allergic reaction      10/01/2020 Tumor Marker   Patient's tumor was tested for the following markers: CA-125. Results of the tumor marker test revealed 37.2.   10/15/2020 Tumor Marker   Patient's tumor was tested for the following markers: CA-125. Results of the tumor marker test revealed 48   10/22/2020 Tumor Marker   Patient's tumor was tested for the following markers: CA-125 Results of the tumor marker test revealed 28.5   11/05/2020 Tumor Marker   Patient's tumor was tested for the following markers: CA-125 Results of the tumor marker test revealed 18.9   12/17/2020 Imaging   1. No significant change in soft tissue thickening and nodularity in the low pelvis, with minimal thickening of the peritoneum throughout the abdomen. 2. Similar appearance of soft tissue thickening about the mid small bowel in the central, ventral abdomen. 3. Findings are consistent with stable peritoneal metastatic disease. 4. Status post hysterectomy and cholecystectomy.   12/18/2020 Tumor Marker   Patient's tumor was tested for the following markers: CA-125 Results of the tumor marker test revealed 10.8   01/11/2021 Imaging   Findings concerning for small bowel obstruction.     01/11/2021 Tumor Marker   Patient's tumor was tested for the following markers: CA-125. Results of the tumor marker test revealed 10.4.   01/11/2021 Imaging   CT abdomen and pelvis Mildly prominent proximal loops of small bowel although no true transition zone is seen in the distal small bowel appears within normal limits. These changes could represent early  partial small bowel obstruction. The overall appearance however is similar to that seen on prior CT from May of 2022.   Mild peritoneal thickening similar to that seen on the prior exam.   Fatty liver.   No other focal abnormality is noted.   05/17/2021 Imaging   1. Successful drainage of previously demonstrated air-collection in the anterior abdominal wall following pigtail catheter placement. 2. No well-defined residual enterocutaneous fistula identified, although there is faintly increased density within the anterior abdominal wall deep to the catheter which could reflect enhancing granulation tissue or a residual small fistula. Correlate with output from the drain. 3. No evidence of bowel obstruction or extraluminal intra-abdominal fluid or air collections. 4. Stable mild biliary dilatation post cholecystectomy.   07/08/2021 Imaging   1. No significant change in appearance of the soft tissue nodularity in the anterior abdominal wall in the region of the prior  fistulous tract, no new walled off fluid collection. 2. Similar appearance of the mixed density nodular area in the anterior abdomen, possibly reflecting loops of bowel, scarring and ascites however this area is difficult to delineate given the lack of oral contrast material. No intra abdominopelvic walled off fluid collection. 3. Increased small volume of abdominopelvic free fluid with similar peritoneal thickening. 4. Similar wall thickening of the prominent gas and fluid-filled loops of small bowel in the right hemiabdomen without evidence of high-grade obstruction.   08/01/2021 - 08/07/2021 Hospital Admission   She was admitted to the hospital for management of subacute bowel obstruction   08/02/2021 Pathology Results   A. PERIUMBILICAL COMPLEX MASS, MIDLINE, BIOPSY:  - Metastatic poorly differentiated carcinoma, see comment   COMMENT:   Immunohistochemical stains show that the tumor cells are positive for CK7 and CDX2 (weak to  moderate) while they are negative for CK20, PAX8, ER and GATA3.  This immunoprofile is most suggestive of an upper gastrointestinal or pancreatobiliary primary.  Clinical and radiologic correlation is suggested.     08/06/2021 Imaging   CT enterography  1. Interval mild increase in ascites compared with prior CT of 5 days ago. No focal extraluminal air or fluid collections are identified. 2. Persistent small bowel wall thickening and enhancement in the anterior abdomen with upstream small bowel dilatation suggesting residual partial small bowel obstruction. 3. Anterior abdominal wall fluid collections have decreased in size. 4. No significant solid visceral organ or vascular abnormalities identified.     PHYSICAL EXAMINATION: ECOG PERFORMANCE STATUS: 3 - Symptomatic, >50% confined to bed  Vitals:   08/16/21 1441  BP: (!) 121/91  Pulse: (!) 120  Resp: 18  Temp: 98.4 F (36.9 C)  SpO2: 97%   Filed Weights   08/16/21 1441  Weight: 144 lb 3.2 oz (65.4 kg)    GENERAL:alert, no distress and comfortable.  She has lost a lot of weight SKIN: skin color, texture, turgor are normal, no rashes or significant lesions EYES: normal, Conjunctiva are pink and non-injected, sclera clear OROPHARYNX: Noted dry mucous membrane NECK: supple, thyroid normal size, non-tender, without nodularity LYMPH:  no palpable lymphadenopathy in the cervical, axillary or inguinal LUNGS: clear to auscultation and percussion with normal breathing effort HEART: Tachycardia, no murmurs and no lower extremity edema ABDOMEN:abdomen is distended with an area near the umbilicus consistent with abnormal area seen on CT imaging.  There is drainage consistent with fistula Musculoskeletal:no cyanosis of digits and no clubbing  NEURO: alert & oriented x 3 with fluent speech, no focal motor/sensory deficits  LABORATORY DATA:  I have reviewed the data as listed    Component Value Date/Time   NA 136 08/06/2021 0500   K 3.6  08/06/2021 0500   CL 104 08/06/2021 0500   CO2 24 08/06/2021 0500   GLUCOSE 100 (H) 08/06/2021 0500   BUN <5 (L) 08/06/2021 0500   CREATININE 0.52 08/06/2021 0500   CREATININE 0.71 07/08/2021 1615   CALCIUM 8.4 (L) 08/06/2021 0500   PROT 5.9 (L) 08/06/2021 0500   ALBUMIN 2.8 (L) 08/06/2021 0500   AST 19 08/06/2021 0500   AST 16 07/08/2021 1615   ALT 16 08/06/2021 0500   ALT 10 07/08/2021 1615   ALKPHOS 65 08/06/2021 0500   BILITOT 0.4 08/06/2021 0500   BILITOT 0.4 07/08/2021 1615   GFRNONAA >60 08/06/2021 0500   GFRNONAA >60 07/08/2021 1615   GFRAA >60 04/05/2020 1008    No results found for: SPEP, UPEP  Lab Results  Component Value Date   WBC 5.9 08/06/2021   NEUTROABS 3.4 07/08/2021   HGB 9.3 (L) 08/06/2021   HCT 28.1 (L) 08/06/2021   MCV 82.2 08/06/2021   PLT 281 08/06/2021      Chemistry      Component Value Date/Time   NA 136 08/06/2021 0500   K 3.6 08/06/2021 0500   CL 104 08/06/2021 0500   CO2 24 08/06/2021 0500   BUN <5 (L) 08/06/2021 0500   CREATININE 0.52 08/06/2021 0500   CREATININE 0.71 07/08/2021 1615      Component Value Date/Time   CALCIUM 8.4 (L) 08/06/2021 0500   ALKPHOS 65 08/06/2021 0500   AST 19 08/06/2021 0500   AST 16 07/08/2021 1615   ALT 16 08/06/2021 0500   ALT 10 07/08/2021 1615   BILITOT 0.4 08/06/2021 0500   BILITOT 0.4 07/08/2021 1615       RADIOGRAPHIC STUDIES: I have reviewed the latest CT imaging with the patient and her husband I have personally reviewed the radiological images as listed and agreed with the findings in the report. DG Abd 1 View  Result Date: 08/02/2021 CLINICAL DATA:  Nasogastric tube placement. EXAM: ABDOMEN - 1 VIEW COMPARISON:  Abdominal plain film dated May 04, 2021 and abdomen and pelvis CT dated August 01, 2021 FINDINGS: A nasogastric tube is seen with its distal tip looped within the gastric antrum. Dilated small bowel loops are seen within the mid to upper abdomen. Radiopaque contrast is seen  within the urinary bladder and bilateral renal collecting systems. Radiopaque surgical clips are noted within the right upper quadrant. No radio-opaque calculi or other significant radiographic abnormality are seen. IMPRESSION: Nasogastric tube positioning, as described above. Electronically Signed   By: Virgina Norfolk M.D.   On: 08/02/2021 02:57   CT ABDOMEN PELVIS W CONTRAST  Result Date: 08/01/2021 CLINICAL DATA:  Bowel obstruction, history of prior fistula EXAM: CT ABDOMEN AND PELVIS WITH CONTRAST TECHNIQUE: Multidetector CT imaging of the abdomen and pelvis was performed using the standard protocol following bolus administration of intravenous contrast. CONTRAST:  115m OMNIPAQUE IOHEXOL 350 MG/ML SOLN COMPARISON:  07/08/2021 FINDINGS: Lower chest: No acute pleural or parenchymal lung disease. Hepatobiliary: Gallbladder is surgically absent. Postsurgical dilatation of the common bile duct measuring up to 11 mm. No focal liver abnormality. Pancreas: Unremarkable. No pancreatic ductal dilatation or surrounding inflammatory changes. Spleen: Normal in size without focal abnormality. Adrenals/Urinary Tract: Kidneys enhance normally and symmetrically. The adrenals are unremarkable. Bladder is completely decompressed, limiting its evaluation. Stomach/Bowel: There is dilation and hyperenhancement of the mucosa of the duodenum and proximal jejunum, measuring up to 3.7 cm in maximal diameter. The distal small bowel and colon are relatively decompressed. Transition point is in the region of prior abdominal wall fistula, consistent with adhesions. Multilocular fluid collections are seen within the anterior abdominal wall at the site of previous enterocutaneous fistula, measuring up to 2.0 cm. It is unclear whether these reflect recurrent fistula and bowel contents, evaluation limited without oral contrast. Multiple subcutaneous abscesses could also give this appearance. Vascular/Lymphatic: No significant vascular  findings are present. No enlarged abdominal or pelvic lymph nodes. Reproductive: Status post hysterectomy. No adnexal masses. Other: Trace upper abdominal ascites again noted. No free intra-abdominal gas. Musculoskeletal: No acute or destructive bony lesions. Reconstructed images demonstrate no additional findings. IMPRESSION: 1. Dilated hyperenhancing loops of proximal small bowel, consistent with obstruction. Transition point at the site of prior enterocutaneous fistula with multiple adhesions in the region of the umbilicus. 2.  Multilocular fluid collections in the subcutaneous tissues of the anterior abdominal wall, at site of previous enterocutaneous fistula. Differential diagnosis includes recurrent fistula versus subcutaneous abscesses. Evaluation limited without oral contrast. 3. Trace ascites, stable. 4. Stable postsurgical dilatation of the common bile duct after cholecystectomy. Electronically Signed   By: Randa Ngo M.D.   On: 08/01/2021 23:53   CT ENTERO ABD/PELVIS W CONTAST  Result Date: 08/06/2021 CLINICAL DATA:  Abdominal pain and possible bowel obstruction. History ovarian cancer with enterocutaneous fistula. Patient underwent drainage of an abdominal fluid collection 05/03/2021. EXAM: CT ABDOMEN AND PELVIS WITH CONTRAST (ENTEROGRAPHY) TECHNIQUE: Multidetector CT of the abdomen and pelvis during bolus administration of intravenous contrast. Negative oral contrast was given. CONTRAST:  50m OMNIPAQUE IOHEXOL 350 MG/ML SOLN COMPARISON:  Abdominopelvic CT 08/01/2021 and 07/08/2021. FINDINGS: Lower chest: Clear lung bases. No significant pleural or pericardial effusion. There is a small fluid-filled hiatal hernia. Hepatobiliary: The liver is normal in density without suspicious focal abnormality. Similar biliary dilatation status post cholecystectomy. The common hepatic duct measures up to 9 mm in diameter and tapers distally. No evidence of calcified intraductal calculus. Pancreas: Unremarkable.  No pancreatic ductal dilatation or surrounding inflammatory changes. Spleen: Normal in size without focal abnormality. Adrenals/Urinary Tract: Both adrenal glands appear normal. The kidneys appear normal without evidence of urinary tract calculus, suspicious lesion or hydronephrosis. No bladder abnormalities are seen. Stomach/Bowel: Low-density enteric contrast was administered. There is high density contrast material throughout the colon, extending to the rectum. No rectal tube is in place. The stomach appears unremarkable for its degree of distention. There is persistent moderate dilatation of the proximal to mid small bowel with relative decompression of the distal small bowel. There is irregular small bowel wall thickening and enhancement anteriorly in the central abdomen adjacent to inflammatory changes within the anterior abdominal wall. Previously demonstrated anterior abdominal wall fluid collections have decreased in size, largest remaining collection measuring 2.5 x 1.4 cm on image 54/2. No focal extraluminal intra-abdominal fluid collections are identified, although ascites has mildly increased in volume compared with the prior CT. There is no extravasated positive enteric contrast or free intraperitoneal air. No focal colonic abnormalities are identified. Vascular/Lymphatic: There are no enlarged abdominal or pelvic lymph nodes. No significant vascular findings are identified. The portal, superior mesenteric and splenic veins are patent. Reproductive: Status post hysterectomy with stable soft tissue stranding in the pelvic fat. No discrete focal mass. Other: Improvement in previously demonstrated small fluid collections within the anterior abdominal wall. No gas is identified to suggest a recurrent fistula. No pneumoperitoneum. Musculoskeletal: No acute or significant osseous findings. Mild convex left lumbar scoliosis. IMPRESSION: 1. Interval mild increase in ascites compared with prior CT of 5 days  ago. No focal extraluminal air or fluid collections are identified. 2. Persistent small bowel wall thickening and enhancement in the anterior abdomen with upstream small bowel dilatation suggesting residual partial small bowel obstruction. 3. Anterior abdominal wall fluid collections have decreased in size. 4. No significant solid visceral organ or vascular abnormalities identified. Electronically Signed   By: WRichardean SaleM.D.   On: 08/06/2021 17:34   DG Abd 2 Views  Result Date: 08/06/2021 CLINICAL DATA:  46year old female with ovarian cancer, enterocutaneous fistula, hospitalized due to small-bowel obstruction. Abdominal wall abscess. EXAM: ABDOMEN - 2 VIEW COMPARISON:  [facility] 08/03/2021 and earlier. FINDINGS: Upright and supine views at 1038 hours. Stable cholecystectomy clips. Stable lung bases with mild elevation of the right hemidiaphragm. Enteric tube removed. Abnormal contour of what  appears to be IV contrast excreted into the urinary bladder in the central pelvis. Small volume retained oral contrast in nondilated ascending and descending large bowel, with other gas-filled small and large bowel loops in the abdomen and pelvis not significantly changed from imaging last month. Superimposed pelvic phleboliths. No acute osseous abnormality identified. IMPRESSION: 1. Lobulated, irregular contour of the urinary bladder presumably containing excreted IV contrast. This bladder irregularity is nonspecific but may be sequelae of ovarian cancer or cancer treatment. 2. Abnormal but nonobstructed appearing bowel gas pattern not significantly changed since 08/01/2021. Electronically Signed   By: Genevie Ann M.D.   On: 08/06/2021 10:37   DG Abd Portable 1V-Small Bowel Obstruction Protocol-initial, 8 hr delay  Result Date: 08/03/2021 CLINICAL DATA:  Small-bowel obstruction 8 hour delay. EXAM: PORTABLE ABDOMEN - 1 VIEW COMPARISON:  August 02, 2021 (2:59 p.m.) FINDINGS: There is stable nasogastric tube positioning.  The bowel gas pattern is normal. Radiopaque contrast is seen throughout the ascending, transverse and descending colon. No dilated bowel loops are identified. Radiopaque surgical clips are seen within the right upper quadrant. No radio-opaque calculi or other significant radiographic abnormality are seen. IMPRESSION: Oral contrast seen throughout the ascending, transverse and descending colon, without visualized dilated bowel loops. Electronically Signed   By: Virgina Norfolk M.D.   On: 08/03/2021 04:33   DG Abd Portable 1V-Small Bowel Protocol-Position Verification  Result Date: 08/02/2021 CLINICAL DATA:  NG tube placement. EXAM: PORTABLE ABDOMEN - 1 VIEW COMPARISON:  Abdominal x-ray 08/02/2021. FINDINGS: Nasogastric tube tip is at the level of the gastroesophageal junction. The distal 2.5 cm of the catheter is folded upon itself. No dilated bowel loops are seen. Cholecystectomy clips are present. Visualized lung bases are clear. No fractures are identified. IMPRESSION: 1. Enteric tube tip is at the level of the gastroesophageal junction in the distal portion of the tip is folded upon itself. Recommend repositioning. Electronically Signed   By: Ronney Asters M.D.   On: 08/02/2021 15:39   DG Abd Portable 1 View  Result Date: 08/02/2021 CLINICAL DATA:  Nasogastric tube placement. EXAM: PORTABLE ABDOMEN - 1 VIEW COMPARISON:  Same day. FINDINGS: Nasogastric tube tip is seen in expected position of proximal stomach. No abnormal bowel dilatation is noted. IMPRESSION: Nasogastric tube tip seen in expected position of proximal stomach. Electronically Signed   By: Marijo Conception M.D.   On: 08/02/2021 09:48   CT ABDOMINAL MASS BIOPSY  Result Date: 08/02/2021 CLINICAL DATA:  hx ovarian CA and enterocutaneous fistula, currently hospitalized due to SBO. CT on 12/29 showed multilocular fluid collections in the subcutaneous tissues of the anterior abdominal wall. EXAM: CT GUIDED NEEDLE ASPIRATE AND CORE BIOPSY  OF ANTERIOR ABDOMINAL WALL COMPLEX MASS ANESTHESIA/SEDATION: Intravenous Fentanyl 91mg and Versed 243mwere administered as conscious sedation during continuous monitoring of the patient's level of consciousness and physiological / cardiorespiratory status by the radiology RN, with a total moderate sedation time of 10 minutes. PROCEDURE: The procedure risks, benefits, and alternatives were explained to the patient. Questions regarding the procedure were encouraged and answered. The patient understands and consents to the procedure. Select axial scans through the mid abdomen obtained in the complex anterior abdominal process was localized. An appropriate skin site was determined and marked. The operative field was prepped with chlorhexidinein a sterile fashion, and a sterile drape was applied covering the operative field. A sterile gown and sterile gloves were used for the procedure. Local anesthesia was provided with 1% Lidocaine. Under CT fluoroscopic guidance, a  17 gauge trocar needle was advanced into the lesion. Approximately 4 mL of viscous purulent-appearing material were aspirated, sent for Gram stain and culture. The needle was positioned at the margin of the lesion. Once needle tip position was confirmed, coaxial 18-gauge core biopsy samples were obtained, obtaining 4 solid-appearing cores submitted in formalin to surgical pathology. The guide needle was removed. Postprocedure scans show no hemorrhage or other apparent complication. The patient tolerated the procedure well. COMPLICATIONS: None immediate FINDINGS: Complex lobular anterior abdominal wall process was localized. 4 mL purulent-appearing material aspirated from the lesion. Multiple core biopsy samples were obtained. IMPRESSION: 1. Technically successful CT-guided aspiration and biopsy of anterior abdominal wall process. Electronically Signed   By: Lucrezia Europe M.D.   On: 08/02/2021 14:47

## 2021-08-16 NOTE — Telephone Encounter (Signed)
Fax DUR form (drug utilization review) to [employer]

## 2021-08-16 NOTE — Assessment & Plan Note (Signed)
Her clinical exam is compatible with recurrent small bowel obstruction I recommend the patient to present to the ER for urgent evaluation and management

## 2021-08-16 NOTE — Progress Notes (Signed)
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 There is a 16 cm complex cystic mass in the pelvis containing thickened septations located near midline, abutting the superior aspect of the uterus. I suspect this mass is adnexal/ovarian in origin rather than uterine in origin. Specifically, I suspect the mass likely arises from the left ovary/adnexa and is most consistent with a neoplasm. Malignancy is certainly not excluded on this study. Recommend a pelvic ultrasound for further evaluation. 2. The rounded more solid-appearing structure in the right side of the pelvis is probably the right ovary. Recommend attention on ultrasound. 3. The small amount of fluid in the pelvis is likely reactive to the complex cystic mass likely rising from the left ovary/adnexa. 4. No cause for blood in vomit or black stools identified. No convincing evidence of a perforated or inflamed gastric or duodenal ulcer. 5. Hepatic steatosis.   07/02/2019 Imaging   MRI pelvis 1. 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 There are 2 enhancing nodules within the central upper abdomen raising the possibility of peritoneal carcinomatosis. Additionally, there is a small amount fluid within the pelvis. Malignant ascites not excluded. 2. Fibroid uterus. 3. Hepatic steatosis.   07/02/2019 Imaging   US pelvis 1. There is a large mass in the pelvis also seen on CT imaging. A separate left ovary is not visualized. 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 The mass is suspicious for malignancy. Recommend gynecologic consultation. 2. The right ovary demonstrates an  irregular ill-defined hypoechoic hypervascular region centrally. The findings are nonspecific in the right ovary. However, given the apparent left ovarian neoplasm suspicious for malignancy, recommend an MRI to further assess the right ovary. 3. Uterine fibroid measuring 3.6 cm.     07/21/2019 Pathology Results   FINAL MICROSCOPIC DIAGNOSIS: A. 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 - See oncology table. B. OMENTUM, RESECTION: - Omental lymph node with metastatic carcinoma (1/1). - Omental adipose tissue with foci of inflammation and reactive mesothelial changes. C. FALLOPIAN TUBE, LEFT, RESECTION: - Fallopian tube with focal, mild inflammation and fibrosis. - No tumor identified. D. UTERUS, CERVIX, RIGHT TUBE AND OVARY: - Cervix Nabothian cyst and squamous metaplasia. - Endometrium Proliferative. No hyperplasia or carcinoma. - Myometrium Leiomyomata. No malignancy. -Right ovary Poorly differentiated carcinoma, 4.8 cm. See oncology table and comment. -Right Fallopian tube Benign paratubal cyst. No endometriosis or malignancy. ONCOLOGY TABLE: OVARY or FALLOPIAN TUBE or PRIMARY PERITONEUM: Procedure: Bilateral f-oophorectomy, hysterectomy and omentectomy. Specimen Integrity: Intact Tumor Site: Left ovary and right ovary. See comment. Ovarian Surface Involvement: Not identified. Fallopian Tube Surface Involvement: Not identified. Tumor Size: Left ovary: 18.6 x 16.0 x 7.2 cm. Right ovary: 4.8 x 3.2 x 3.2 cm. Histologic Type: Clear cell carcinoma. See comment. Histologic Grade: High-grade. Other Tissue/ Organ Involvement: Omental lymph node with metastatic carcinoma. Peritoneal/Ascitic Fluid: Pending. Treatment Effect: No known presurgical therapy. Pathologic Stage Classification (pTNM, AJCC 8th Edition): pT3a, pN1a. Representative Tumor Block: A2, A3, A4, A5 A6, A7 and A8. Comment(s): The left ovarian tumor is 18.6 cm in greatest  dimension and has features of clear cell carcinoma. The right ovarian  tumor is 4.8 cm in greatest dimension and is poorly differentiated with focal features consistent with clear cell carcinoma. The pattern of involvement suggests that the right ovarian tumor is likely metastatic from the larger left ovarian clear cell carcinoma. Sections of the omentum show a lymph node with metastatic carcinoma. Sections of the remainder of the omentum do not show involvement by carcinoma.   07/21/2019 Surgery   Procedure(s) Performed: Exploratory laparotomy with radical tumor debulking including total hysterectomy, bilateral salpingo-oophorectomy, infra-gastric omentectomy, and washings.   Surgeon: Jeral Pinch, MD    Operative Findings: On EUA, mobile uterus and ovarian mass, situated out of the pelvis, spanning 6-8 cm above the umbilicus.  On intra-abdominal entry, inflamed omentum adherent to much of the 16 cm left ovarian mass, adhesions easily lysed bluntly.  No nodularity of the ovarian mass however on delivery of the mass through the midline incision, there was Intra-Op rupture of one of the smaller cystic components with drainage of clear brown-tinged fluid.  Grossly normal-appearing left fallopian tube as well as right tube.  Right ovary mildly enlarged measuring approximately 4 cm and firm/nodular, suspicious for tumor involvement.  Uterus 8-10 cm with 4 cm anterior mid body fibroid.  Some very small volume miliary disease along right pelvic sidewall, removed with uterine specimen.  Evidence of small diverticular disease.  Small bowel normal appearing although multiple loops of bowel adherent to each other, especially by mesentery, and an inflammatory manner.  An approximately 2 x 2 centimeter nodule suspicious for tumor implant was found in the omentum just inferior to the greater curvature of the stomach.  Additional small (less than 5 mm) implants noted on the stomach and posterior aspect of the left lobe  of the liver.  Otherwise the liver surface and diaphragm were smooth.   08/03/2019 Cancer Staging   Staging form: Ovary, Fallopian Tube, and Primary Peritoneal Carcinoma, AJCC 8th Edition - Pathologic: Stage IIIA2 (pT3a, pN1, cM0) - Signed by Heath Lark, MD on 08/03/2019    08/12/2019 Procedure   Placement of single lumen port a cath via right internal jugular vein. The catheter tip lies at the cavo-atrial junction. A power injectable port a cath was placed and is ready for immediate use.   08/22/2019 - 12/05/2019 Chemotherapy   The patient had carboplatin and taxol for chemotherapy treatment.     09/23/2019 Genetic Testing   Negative genetic testing:  No pathogenic variants detected on the [facility] TumorNext-HRD+CancerNext paired germline and somatic genetic test. The report date is 09/23/2019.  The TumorNext-HRD+CancerNext test offered by [facility] includes paired germline and tumor analyses of 11 genes associated with homologous recombination repair (ATM, BARD1, BRIP1, CHEK2, MRE11A, NBN, PALB2, RAD51C, RAD51D, BRCA1, BRCA2) plus germline analyses of 26 additional genes associated with hereditary cancer (APC, AXIN2, BMPR1A, CDH1, CDK4, CDKN2A, DICER1, HOXB13, EPCAM, GREM1, MLH1, MSH2, MSH3, MSH6, MUTYH, NF1, NTHL1, PMS2, POLD1, POLE, PTEN, RECQL, SMAD4, SMARCA4, STK11, and TP53).   11/14/2019 Tumor Marker   Patient's tumor was tested for the following markers: CA-125 Results of the tumor marker test revealed 10.2.   12/05/2019 Tumor Marker   Patient's tumor was tested for the following markers: CA-125 Results of the tumor marker test revealed 9.9   01/05/2020 Imaging   No specific findings of active malignancy. Interval resection of the pelvic mass. Subtle nodularity along the right side of the vaginal cuff merits surveillance.     01/05/2020 Tumor Marker   Patient's tumor was tested for the following markers: CA-125 Results of the  tumor marker test revealed 9.7   02/17/2020 Tumor Marker    Patient's tumor was tested for the following markers: CA-125 Results of the tumor marker test revealed 9.6   04/05/2020 Imaging   1. There is some minimal stranding along the mesentery and omentum without overt omental caking or well-defined nodularity. Faint bandlike density along the anterior peritoneal reflection in the pelvis, slightly more notable than on prior. Overall the appearance is not considered specific for recurrence but given the slight increase in prominence from 01/05/2020, would encourage careful surveillance by tumor markers and imaging. 2. Mild lower lumbar degenerative facet arthropathy.   04/05/2020 Tumor Marker   Patient's tumor was tested for the following markers: CA-125 Results of the tumor marker test revealed 11   05/29/2020 Tumor Marker   Patient's tumor was tested for the following markers: CA-125. Results of the tumor marker test revealed 17.   07/04/2020 Tumor Marker   Patient's tumor was tested for the following markers: CA-125. Results of the tumor marker test revealed 20.1   07/16/2020 Imaging   Stable mild stranding throughout mesenteric and omental fat, without evidence of discrete masses or ascites. No evidence of new or progressive disease.   08/29/2020 Tumor Marker   Patient's tumor was tested for the following markers: CA-125 Results of the tumor marker test revealed 32.7.   09/07/2020 Imaging   1. Interval progression of peritoneal disease within the abdomen and pelvis. Tumor predominantly involves the serosal surface of the large and small bowel loops. No signs of bowel obstruction identified at this time. 2. No evidence for metastatic disease to the chest.   09/19/2020 Imaging   1. No acute findings identified within the abdomen or pelvis. No evidence for bowel obstruction. 2. Similar appearance of soft tissue thickening along the peritoneal reflections within the abdomen and pelvis compatible with known peritoneal disease. Recently characterized  increased soft tissue along the surface of the jejunal bowel loops and tethering of pelvic small bowel loops is difficult to quantify (and compared with previous exam) due to lack of IV contrast material.     09/20/2020 - 12/25/2020 Chemotherapy    Patient is on Treatment Plan: OVARIAN RECURRENT 3RD LINE CARBOPLATIN D1 / GEMCITABINE D1,8 (4/800) Q21D  Carboplatin was discontinued after 10/15/2020 due to allergic reaction      10/01/2020 Tumor Marker   Patient's tumor was tested for the following markers: CA-125. Results of the tumor marker test revealed 37.2.   10/15/2020 Tumor Marker   Patient's tumor was tested for the following markers: CA-125. Results of the tumor marker test revealed 48   10/22/2020 Tumor Marker   Patient's tumor was tested for the following markers: CA-125 Results of the tumor marker test revealed 28.5   11/05/2020 Tumor Marker   Patient's tumor was tested for the following markers: CA-125 Results of the tumor marker test revealed 18.9   12/17/2020 Imaging   1. No significant change in soft tissue thickening and nodularity in the low pelvis, with minimal thickening of the peritoneum throughout the abdomen. 2. Similar appearance of soft tissue thickening about the mid small bowel in the central, ventral abdomen. 3. Findings are consistent with stable peritoneal metastatic disease. 4. Status post hysterectomy and cholecystectomy.   12/18/2020 Tumor Marker   Patient's tumor was tested for the following markers: CA-125 Results of the tumor marker test revealed 10.8   01/11/2021 Imaging   Findings concerning for small bowel obstruction.     01/11/2021 Tumor Marker   Patient's tumor  was tested for the following markers: CA-125. Results of the tumor marker test revealed 10.4.   01/11/2021 Imaging   CT abdomen and pelvis Mildly prominent proximal loops of small bowel although no true transition zone is seen in the distal small bowel appears within normal limits. These  changes could represent early partial small bowel obstruction. The overall appearance however is similar to that seen on prior CT from May of 2022.   Mild peritoneal thickening similar to that seen on the prior exam.   Fatty liver.   No other focal abnormality is noted.   05/17/2021 Imaging   1. Successful drainage of previously demonstrated air-collection in the anterior abdominal wall following pigtail catheter placement. 2. No well-defined residual enterocutaneous fistula identified, although there is faintly increased density within the anterior abdominal wall deep to the catheter which could reflect enhancing granulation tissue or a residual small fistula. Correlate with output from the drain. 3. No evidence of bowel obstruction or extraluminal intra-abdominal fluid or air collections. 4. Stable mild biliary dilatation post cholecystectomy.   07/08/2021 Imaging   1. No significant change in appearance of the soft tissue nodularity in the anterior abdominal wall in the region of the prior fistulous tract, no new walled off fluid collection. 2. Similar appearance of the mixed density nodular area in the anterior abdomen, possibly reflecting loops of bowel, scarring and ascites however this area is difficult to delineate given the lack of oral contrast material. No intra abdominopelvic walled off fluid collection. 3. Increased small volume of abdominopelvic free fluid with similar peritoneal thickening. 4. Similar wall thickening of the prominent gas and fluid-filled loops of small bowel in the right hemiabdomen without evidence of high-grade obstruction.   08/01/2021 - 08/07/2021 Hospital Admission   She was admitted to the hospital for management of subacute bowel obstruction   08/02/2021 Pathology Results   A. PERIUMBILICAL COMPLEX MASS, MIDLINE, BIOPSY:  - Metastatic poorly differentiated carcinoma, see comment   COMMENT:   Immunohistochemical stains show that the tumor cells are  positive for CK7 and CDX2 (weak to moderate) while they are negative for CK20, PAX8, ER and GATA3.  This immunoprofile is most suggestive of an upper gastrointestinal or pancreatobiliary primary.  Clinical and radiologic correlation is suggested.     08/06/2021 Imaging   CT enterography  1. Interval mild increase in ascites compared with prior CT of 5 days ago. No focal extraluminal air or fluid collections are identified. 2. Persistent small bowel wall thickening and enhancement in the anterior abdomen with upstream small bowel dilatation suggesting residual partial small bowel obstruction. 3. Anterior abdominal wall fluid collections have decreased in size. 4. No significant solid visceral organ or vascular abnormalities identified.    Patient was most recently admitted with multiple subcutaneous fluid collections and partial small bowel obstruction.  Her partial small bowel obstruction was treated conservatively with ultimate improvement in her symptoms and ability to tolerate some p.o.  One of her subcutaneous fluid collections was aspirated and has been spontaneously draining since.  That fluid showed a polymicrobial infection.  Additionally, given concern for slow progression of subcutaneous findings, CT guided biopsy was also performed at the same time as aspiration.  Biopsy results most consistent with GI malignancy.  CT was performed prior to discharge.  There was no definitive evidence of recurrent EC fistula.  Interval History: Since discharge from the hospital, the patient reports struggling significantly with p.o. intake.  She last had several spoonfuls of soup on Monday.  Otherwise  she is trying her best to take sips with medications, but now is finding even this hard.  She feels quite weak and most of the time is in bed or in a chair.  She gets up to walk to the mailbox once a day and is utterly exhausted by doing this.  She continues to have intense abdominal pain and cramping.  She has  almost constant nausea and is having episodes of emesis with bilious as well as black-colored fluid.  Last bowel movement was 2-3 days ago.  Continues to endorse flatus.  She notes some continued small amount of drainage from the percutaneous aspiration site.  Denies feeling that this area has grown at all or is warm to the touch.  Notes voiding normally, thinks that she has been voiding a normal amount.  Past Medical/Surgical History: Past Medical History:  Diagnosis Date   Anemia due to antineoplastic chemotherapy    Arthritis    Asthma due to seasonal allergies    01-21-2021 per pt seasonal and exercised induced, does not have inhaler, last used one few years   Chronic abdominal pain 01/2021   Chronic nausea    01-21-2021  per pt intermittant nausea and when has abd pain most of time will have vomiting   Family history of breast cancer    Family history of colon cancer    Family history of ovarian cancer    Family history of prostate cancer    History of 2019 novel coronavirus disease (COVID-19) 09/08/2020   positive results in epic, per pt mild symptoms that resolved   History of Helicobacter pylori infection 07/2016   E[facility] w/ bx's by [facility],  chronic h.pylori w/ gastritis,  treated  (no ulcer)   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;  progression w/ abdominal carcinomatosis   On total parenteral nutrition (TPN) 02/02/2021   infused thru [facility] in place    Steroid-induced diabetes ([place])    followed by dr Alvy Bimler    Past Surgical History:  Procedure Laterality Date   CHOLECYSTECTOMY N/A 02/16/2015   Procedure: LAPAROSCOPIC CHOLECYSTECTOMY WITH INTRAOPERATIVE CHOLANGIOGRAM;  Surgeon: Johnathan Hausen, MD;  Location: [facility];  Service: General;  Laterality: N/A;   INCISION AND DRAINAGE ABSCESS N/A 02/02/2021   Procedure: INCISION AND  DRAINAGE ABSCESS;  Surgeon: Lahoma Crocker, MD;  Location: [facility];  Service: Gynecology;  Laterality: N/A;   IR IMAGING GUIDED PORT INSERTION  08/12/2019   IR REMOVAL TUN ACCESS W/ PORT W/O FL MOD SED  03/18/2021   LAPAROSCOPY N/A 01/23/2021   Procedure: LAPAROSCOPY DIAGNOSTIC;  Surgeon: Lafonda Mosses, MD;  Location[facility];  Service: Gynecology;  Laterality: N/A;   LAPAROTOMY N/A 01/23/2021   Procedure: LAPAROTOMY, ENTEROTOMY REPAIR OF THE BOWEL;  Surgeon: Lafonda Mosses, MD;  Location: [facility];  Service: Gynecology;  Laterality: N/A;   LAPAROTOMY N/A 05/03/2021   Procedure: incision and drainage abdominal collection and placement drainage catheter;  Surgeon: Lafonda Mosses, MD;  Location: [facility];  Service: Gynecology;  Laterality: N/A;   SALPINGOOPHORECTOMY N/A 07/21/2019   Procedure: EXPLORATORY LAPAROTOMY, TOTAL ABDOMINAL HYSTERECTOMY, BILATERAL SALPINGO OOPHORECTOMY, OMENTECTOMY;  Surgeon: Lafonda Mosses, MD;  Location: [facility];  Service: Gynecology;  Laterality: N/A;   UPPER GASTROINTESTINAL ENDOSCOPY  08/01/2016   by dr Corinda Gubler in [place] N/A 03/11/2021  Procedure: OPENING AND EXPLORATION OF ABDOMINAL EXCISION;  Surgeon: Lafonda Mosses, MD;  Location: [facility];  Service: Gynecology;  Laterality: N/A;    Family History  Problem Relation Age of Onset   Colon cancer Father 49   Prostate cancer Father 96   Colon cancer Paternal Aunt        dx. early 49s   Ovarian cancer Paternal Grandmother 19   Breast cancer Cousin    Endometriosis Mother    Cancer Paternal Uncle        unknown type, dx. early 19s   Cancer Paternal Uncle 33       unknown type   Esophageal cancer Neg Hx     Social History   Socioeconomic History   Marital status: Married    Spouse name: Herbie [place]   Number of children: 2   Years of education: Not on file   Highest education level: Not on file  Occupational  History   Not on file  Tobacco Use   Smoking status: Never   Smokeless tobacco: Never  Vaping Use   Vaping Use: Never used  Substance and Sexual Activity   Alcohol use: Not Currently    Comment: Social drinker   Drug use: Never   Sexual activity: Yes    Birth control/protection: Surgical  Other Topics Concern   Not on file  Social History Narrative   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  Newly married as of October 2020.   2 boys, age 29 and 26   Social Determinants of Health   Emergency planning/management officer Strain: Not on file  Food Insecurity: Not on file  Transportation Needs: Not on file  Physical Activity: Not on file  Stress: Not on file  Social Connections: Not on file    Current Medications:  Current Outpatient Medications:    acetaminophen (TYLENOL) 500 MG tablet, Take 1,000 mg by mouth every 6 (six) hours as needed for headache (pain)., Disp: , Rfl:    acetaminophen (TYLENOL) 650 MG suppository, Place 1 suppository (650 mg total) rectally every 6 (six) hours as needed for moderate pain., Disp: 15 suppository, Rfl: 2   amoxicillin-clavulanate (AUGMENTIN) 875-125 MG tablet, Take 1 tablet by mouth 2 times daily for 9 days., Disp: 18 tablet, Rfl: 0   dexamethasone (DECADRON) 4 MG tablet, Take 1/2 tablet (2 mg total) by mouth 2 (two) times daily., Disp: 28 tablet, Rfl: 0   Docusate Sodium (COLACE PO), Take 1 capsule by mouth daily., Disp: , Rfl:    famotidine (PEPCID) 20 MG tablet, Take 1 tablet (20 mg total) by mouth daily., Disp: 30 tablet, Rfl: 3   metoCLOPramide (REGLAN) 10 MG tablet, Take 1 tablet (10 mg total) by mouth every 8 (eight) hours as needed for nausea., Disp: 30 tablet, Rfl: 1   Multiple Vitamin (MULTIVITAMIN WITH MINERALS) TABS tablet, Take 1 tablet by mouth daily., Disp: , Rfl:    omeprazole (PRILOSEC) 40 MG capsule, Take 1 capsule by mouth daily., Disp: 30 capsule, Rfl: 9   ondansetron (ZOFRAN ODT) 4 MG disintegrating tablet, Take 1 tablet (4 mg total) by  mouth every 4 (four) hours as needed for up to 20 doses for nausea or vomiting., Disp: 20 tablet, Rfl: 0   ondansetron (ZOFRAN) 8 MG tablet, Take 1 tablet (8 mg total) by mouth every 8 (eight) hours as needed for nausea or vomiting., Disp: 30 tablet, Rfl: 1   oxyCODONE (OXY IR/ROXICODONE) 5 MG immediate release tablet, Take 1-2  tablets by mouth every 4 hours as needed for severe pain or breakthrough pain., Disp: 30 tablet, Rfl: 0   polyethylene glycol (MIRALAX / GLYCOLAX) 17 g packet, Take 17 g by mouth daily as needed (constipation)., Disp: , Rfl:    potassium chloride (KLOR-CON) 20 MEQ packet, Take 2 packets as directed by mouth once a day for 7 days., Disp: 14 packet, Rfl: 0   prochlorperazine (COMPAZINE) 10 MG tablet, Take 1 tablet (10 mg total) by mouth every 8 (eight) hours as needed for nausea or vomiting. (Patient taking differently: Take 10 mg by mouth 2 (two) times daily.), Disp: 30 tablet, Rfl: 0   promethazine (PHENERGAN) 12.5 MG suppository, Place 1 suppository (12.5 mg total) rectally every 8 (eight) hours as needed for up to 30 doses for nausea or vomiting., Disp: 30 each, Rfl: 0   scopolamine (TRANSDERM-SCOP) 1 MG/3DAYS, Place 1 patch (1.5 mg total) onto the skin behind the ear every 3 (three) days., Disp: 10 patch, Rfl: 0  Review of Systems: Denies fevers, chills. Denies hearing loss, neck lumps or masses, mouth sores, ringing in ears or voice changes. Denies cough or wheezing.  Denies pain with intercourse, dysuria, frequency, hematuria or incontinence. Denies hot flashes, pelvic pain, vaginal bleeding or vaginal discharge.   Denies swollen lymph nodes or glands, denies easy bruising or bleeding.  Physical Exam: BP (!) 117/94 (BP Location: Left Arm, Patient Position: Sitting)    Pulse (!) 110    Temp 99.1 F (37.3 C) (Oral)    Resp 18    LMP 07/05/2019    SpO2 100%  General: Appears uncomfortable, cachectic. HEENT: Normocephalic, atraumatic, sclera anicteric. Chest: Clear to  auscultation bilaterally.  No wheezes or rhonchi. Cardiovascular: Tachycardic with heart rate in the 110s, regular rhythm, no murmurs. Abdomen: soft, mild tenderness with palpation in the mid and upper abdomen.  There is continues to be a firm area above the umbilicus in the midline and that measures approximately 6 x 8 cm, not significantly different than at time of discharge from the hospital.  Somewhat hyperactive bowel sounds.  No masses or hepatosplenomegaly appreciated.   Extremities: Grossly normal range of motion.  Warm, well perfused.  No edema bilaterally.  Laboratory & Radiologic Studies: None new  Assessment & Plan: Sarah Newman is a 46 y.o. 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 intake, now failure to thrive, and recent biopsy of subcutaneous fluid and soft tissue density within the anterior abdominal wall most concerning for new GI primary.  The patient has had continued significant weight loss, down 10 pounds from time of discharge.  She is not tolerating p.o. intake, looks uncomfortable today, and is tachycardic, consistent with dehydration.   We were hoping to pursue outpatient GI work-up, but I do not think that the patient is well enough to wait for this to happen outpatient.  There was discussion today with Dr. Alvy Bimler about referral to a tertiary care center.  Unfortunately, [facility] is close to transfers right now, and I suspect the same is true at [facility] and [facility].  I am concerned given how she looks today, that the patient will not tolerate sitting in an emergency department for hours  or days or wait for expedited work-up next week.  We will plan  to get labs today at the cancer center and I recommended that the patient and her husband go directly to the emergency department here, which appears less full than the [facility] emergency department, with the hope that she can be admitted to the hospitalist service and so that we can expedite treatment/work-up.  Recommendations: CT A/P to assess SBO, subcutaneous fluid complex Conservative management of at least partial SBO with NGT, IVFs, PPI Initiate TPN in the setting of severe malnutrition 4.   GI consultation for expedited (and hopefully inpatient) evaluation given symptoms and recent biopsy  32 minutes of total time was spent for this patient encounter, including preparation, face-to-face counseling with the patient and coordination of care, and documentation of the encounter.  Jeral Pinch, MD  Division of Gynecologic Oncology  Department of Obstetrics and Gynecology  [facility]

## 2021-08-17 ENCOUNTER — Ambulatory Visit: Admit: 2021-08-17 | Payer: PRIVATE HEALTH INSURANCE

## 2021-08-17 ENCOUNTER — Ambulatory Visit
Admit: 2021-08-17 | Discharge: 2021-09-12 | Disposition: A | Discharge status: Home Health | Payer: PRIVATE HEALTH INSURANCE | Admitting: Gynecologic Oncology

## 2021-08-17 ENCOUNTER — Ambulatory Visit: Admit: 2021-08-17 | Discharge: 2021-09-12 | Payer: PRIVATE HEALTH INSURANCE

## 2021-08-17 ENCOUNTER — Encounter: Admit: 2021-08-17 | Payer: PRIVATE HEALTH INSURANCE | Attending: Gynecologic Oncology

## 2021-08-17 ENCOUNTER — Encounter: Admit: 2021-08-17 | Payer: PRIVATE HEALTH INSURANCE

## 2021-08-17 LAB — CA 125: Cancer Antigen (CA) 125: 253 U/mL — ABNORMAL HIGH (ref 0.0–38.1)

## 2021-08-17 NOTE — Unmapped (Addendum)
Colleen Russo is a 46 y.o. 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 Her hospital course is outlined by the pertinent problems below.    Onc: Recurrent Clear Cell Carcinoma of the Ovary   The patient was first diagnosed in 2020 and underwent radical debulking with adjuvant chemotherapy consisting of carboplatin/taxol then carboplatin/gem. Most recently her CTAP on 08/01/21 was concerning for disease progression. Biopsy from an abdominal mass on 08/02/21 which was initially concerning for a GI cancer resulted as metastatic disease. She plans to pursue further chemotherapy with Dr. Nelly Rout and Pricilla Holm outpatient.     Small bowel obstruction  Patient with extensive gynecologic history and multiple abdominal surgeries with development of an enterocutaneous fistula. She presented to the ER on 08/16/21 with nausea and vomiting, found to have an SBO. She was originally managed medically with NGT decompression and was eventually able to tolerate a regular diet for about a week. However, her symptoms then recurred around hospital day #13 and an NGT was replaced. VIR performed/placed TEG on 2/3 and removed her NGT.     CTAP was ordered to assess decompression and showed no significant change in appearance of complex, possibly closed loop, small bowel obstruction,      Enterocutaneous Fistula - Hx abscesses - Leukocytosis  Patient has history of cancer related enterocutaneous fistula, and abscesses requiring drainage. On admission to the hospital on 1/13 she was initially placed on broad spectrum antibiotics but was taken off on 1/16 when blood and wound cultures were negative. She received wound dressing changes every other day with zinc oxide barrier and dressing. On 2/5 the patient had several episodes of diarrhea, was tachycardic, and WBC up-trended to 13. CTAP showed multiple new locules of fluid and gas in the subcutaneous soft tissues, which was thought to be a superimposed infection and she was started on Zosyn. She underwent a drain placement with VIR and subcutaneous drainage collection on 2/7. They aspirated 10 mL of purulent  fluid which was sent for culture and grew 2+ gram negative rods and 3+ polymorphonuclear leukocytes. She was transitioned from Zosyn to Levofloxacin and Flagyl until 2/19.     Nausea/Vomiting  Patient was received antiemetics per Palliative which consisted of compazine, Reglan, octreotide, scopolamine patch, and Dexamethasone 4 mg every day with a taper schedule. Her octreotide was transitioned to be included in her TPN .     Pain  With assistance of Palliative care, the patient's pain was controlled on a regimen of 50 mcg fentanyl patches, Tylenol PRN, Roxanol 20 mg q4h PRN, IV morphine PRN, and tizanidine.     Poor Nutrition  Nutrition and Palliative care followed with the patient while admitted. She received olanzapine and was started on TPN (1/28-). Due to elevated blood glucoses, she was managed with NPH 20 units 1 hour prior to starting TPN and a regular insulin sliding scale with POC glucose checks every 6 hours. At the time of discharge, she was continued on NPH and her sliding scale was transitioned to lispro.     Anxiety  Palliative followed with the patient for regular monitoring.     Anemia   There was no acute concern for bleeding. Her anemia was likely due to chronic disease. The patient received 1u PRBC on 1/28 to optimize her hgb prior to chemotherapy.     Thyroid Function  The patient's  TSH was 0.237 and her Free T4 was 0.54. Her labs were consistent with Sick Euthyroid Syndrome.     On HD#28 she was deemed stable for discharge. She will see Dr. Pricilla Holm on 09/16/21 for a follow-up visit.

## 2021-08-19 LAB — CEA (IN HOUSE-CHCC): CEA (CHCC-In House): 5.04 ng/mL — ABNORMAL HIGH (ref 0.00–5.00)

## 2021-08-21 ENCOUNTER — Other Ambulatory Visit (HOSPITAL_COMMUNITY): Payer: Self-pay

## 2021-08-21 LAB — CULTURE, BLOOD (SINGLE)
Culture: NO GROWTH
Culture: NO GROWTH

## 2021-08-26 ENCOUNTER — Encounter
Admit: 2021-08-26 | Discharge: 2021-08-26 | Payer: PRIVATE HEALTH INSURANCE | Attending: Hematology & Oncology | Primary: Hematology & Oncology

## 2021-08-26 DIAGNOSIS — C569 Malignant neoplasm of unspecified ovary: Principal | ICD-10-CM

## 2021-08-28 ENCOUNTER — Ambulatory Visit: Payer: Medicaid Other | Admitting: Nurse Practitioner

## 2021-08-28 MED ORDER — FENTANYL 50 MCG/HR TRANSDERMAL PATCH
MEDICATED_PATCH | 0 refills | 0 days | Status: CP
Start: 2021-08-28 — End: ?

## 2021-08-28 NOTE — Progress Notes (Deleted)
ASSESSMENT AND PLAN      HISTORY OF PRESENT ILLNESS    Chief Complaint :  Khilee Hendricksen is a 46 y.o. female with a past medical history of ***.  Additional medical history as listed in [place] .   Patient is known to Dr.  Fuller Plan, last seen in 2021 with bowel changes.    Patient has ovarian cancer diagnoased in December 2020. She is followed by Dr. Alvy Bimler and [facility] Dr. Berline Lopes. While undergoing treatment she developed a partial SBO.  There was no evidence of cancer on scans, her CA125 had normalized.  She was taken for diagnostic surgery where significant adhesions were found.  There were multiple loops of small bowel adherent to the anterior abdominal wall.  Exploratory laparotomy was performed, unfortunately after starting lysis of adhesions and enterotomy was made.  This was repaired and the procedure aborted.  In the setting of recent Avastin the patient developed a postoperative enterocutaneous fistula.  That was managed with n.p.o., TPN.  She was eventually able to restart a diet.  She continued to have multiple episodes of subcutaneous abscess requiring drainage.    LABORATORY DATA  Hepatic Function Latest Ref Rng & Units 08/16/2021 08/06/2021 08/05/2021  Total Protein 6.5 - 8.1 g/dL 7.3 5.9(L) 5.9(L)  Albumin 3.5 - 5.0 g/dL 4.1 2.8(L) 2.9(L)  AST 15 - 41 U/L 34 19 18  ALT 0 - 44 U/L 43 16 15  Alk Phosphatase 38 - 126 U/L 103 65 64  Total Bilirubin 0.3 - 1.2 mg/dL 0.5 0.4 0.7    CBC Latest Ref Rng & Units 08/16/2021 08/06/2021 08/05/2021  WBC 4.0 - 10.5 K/uL LAB UNABLE TO COLLECT/UNIT NOTIFIED 5.9 9.9  Hemoglobin 12.0 - 15.0 g/dL LAB UNABLE TO COLLECT/UNIT NOTIFIED 9.3(L) 9.2(L)  Hematocrit 36.0 - 46.0 % LAB UNABLE TO COLLECT/UNIT NOTIFIED 28.1(L) 28.5(L)  Platelets 150 - 400 K/uL LAB UNABLE TO COLLECT/UNIT NOTIFIED 013 143    Lab Results  Component Value Date   LIPASE 42 08/01/2021     PREVIOUS IMAGING        PREVIOUS ENDOSCOPIC EVALUATIONS        Past  Medical History:  Diagnosis Date   Anemia due to antineoplastic chemotherapy    Arthritis    Asthma due to seasonal allergies    01-21-2021 per pt seasonal and exercised induced, does not have inhaler, last used one few years   Chronic abdominal pain 01/2021   Chronic nausea    01-21-2021  per pt intermittant nausea and when has abd pain most of time will have vomiting   Family history of breast cancer    Family history of colon cancer    Family history of ovarian cancer    Family history of prostate cancer    History of 2019 novel coronavirus disease (COVID-19) 09/08/2020   positive results in epic, per pt mild symptoms that resolved   History of Helicobacter pylori infection 07/2016   EGD w/ bx's by [facility],  chronic h.pylori w/ gastritis,  treated  (no ulcer)   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[place]) 07/2019   oncologist--- dr gorsuch/ dr Berline Lopes--- 07-21-2019 s/p exp. 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;  progression w/ abdominal carcinomatosis   On total parenteral nutrition (TPN) 02/02/2021   infused thru PAC   Port-A-Cath in place    Steroid-induced diabetes ([place])    followed by dr Alvy Bimler    Past Surgical History:  Procedure Laterality Date  CHOLECYSTECTOMY N/A 02/16/2015   Procedure: LAPAROSCOPIC CHOLECYSTECTOMY WITH INTRAOPERATIVE CHOLANGIOGRAM;  Surgeon: Johnathan Hausen, MD;  Location: [facility];  Service: General;  Laterality: N/A;   INCISION AND DRAINAGE ABSCESS N/A 02/02/2021   Procedure: INCISION AND DRAINAGE ABSCESS;  Surgeon: Lahoma Crocker, MD;  Location: [facility];  Service: Gynecology;  Laterality: N/A;   IR IMAGING GUIDED PORT INSERTION  08/12/2019   IR REMOVAL TUN ACCESS W/ PORT W/O FL MOD SED  03/18/2021   LAPAROSCOPY N/A 01/23/2021   Procedure: LAPAROSCOPY DIAGNOSTIC;  Surgeon: Lafonda Mosses, MD;  Location[facility];  Service: Gynecology;  Laterality: N/A;   LAPAROTOMY N/A 01/23/2021   Procedure:  LAPAROTOMY, ENTEROTOMY REPAIR OF THE BOWEL;  Surgeon: Lafonda Mosses, MD;  Location: [facility];  Service: Gynecology;  Laterality: N/A;   LAPAROTOMY N/A 05/03/2021   Procedure: incision and drainage abdominal collection and placement drainage catheter;  Surgeon: Lafonda Mosses, MD;  Location: [facility];  Service: Gynecology;  Laterality: N/A;   SALPINGOOPHORECTOMY N/A 07/21/2019   Procedure: EXPLORATORY LAPAROTOMY, TOTAL ABDOMINAL HYSTERECTOMY, BILATERAL SALPINGO OOPHORECTOMY, OMENTECTOMY;  Surgeon: Lafonda Mosses, MD;  Location: [facility];  Service: Gynecology;  Laterality: N/A;   UPPER GASTROINTESTINAL ENDOSCOPY  08/01/2016   by dr Corinda G[place]ler in [place] N/A 03/11/2021   Procedure: OPENING AND EXPLORATION OF ABDOMINAL EXCISION;  Surgeon: Lafonda Mosses, MD;  Location: [facility];  Service: Gynecology;  Laterality: N/A;      Current Medications, Allergies, Family History and Social History were reviewed in [employer] record.     Current Outpatient Medications  Medication Sig Dispense Refill   acetaminophen (TYLENOL) 500 MG tablet Take 1,000 mg by mouth every 6 (six) hours as needed for headache (pain).     acetaminophen (TYLENOL) 650 MG suppository Place 1 suppository (650 mg total) rectally every 6 (six) hours as needed for moderate pain. 15 suppository 2   dexamethasone (DECADRON) 4 MG tablet Take 1/2 tablet (2 mg total) by mouth 2 (two) times daily. 28 tablet 0   Docusate Sodium (COLACE PO) Take 1 capsule by mouth daily.     famotidine (PEPCID) 20 MG tablet Take 1 tablet (20 mg total) by mouth daily. 30 tablet 3   metoCLOPramide (REGLAN) 10 MG tablet Take 1 tablet (10 mg total) by mouth every 8 (eight) hours as needed for nausea. 30 tablet 1   Multiple Vitamin (MULTIVITAMIN WITH MINERALS) TABS tablet Take 1 tablet by mouth daily.     omeprazole (PRILOSEC) 40 MG capsule Take 1 capsule by mouth daily. 30  capsule 9   ondansetron (ZOFRAN ODT) 4 MG disintegrating tablet Take 1 tablet (4 mg total) by mouth every 4 (four) hours as needed for up to 20 doses for nausea or vomiting. 20 tablet 0   ondansetron (ZOFRAN) 8 MG tablet Take 1 tablet (8 mg total) by mouth every 8 (eight) hours as needed for nausea or vomiting. 30 tablet 1   oxyCODONE (OXY IR/ROXICODONE) 5 MG immediate release tablet Take 1-2 tablets by mouth every 4 hours as needed for severe pain or breakthrough pain. 30 tablet 0   polyethylene glycol (MIRALAX / GLYCOLAX) 17 g packet Take 17 g by mouth daily as needed (constipation).     potassium chloride (KLOR-CON) 20 MEQ packet Take 2 packets as directed by mouth once a day for 7 days. 14 packet 0   prochlorperazine (COMPAZINE) 10 MG tablet Take 1 tablet (10 mg total)  by mouth every 8 (eight) hours as needed for nausea or vomiting. (Patient taking differently: Take 10 mg by mouth 2 (two) times daily.) 30 tablet 0   promethazine (PHENERGAN) 12.5 MG suppository Place 1 suppository (12.5 mg total) rectally every 8 (eight) hours as needed for up to 30 doses for nausea or vomiting. 30 each 0   scopolamine (TRANSDERM-SCOP) 1 MG/3DAYS Place 1 patch (1.5 mg total) onto the skin behind the ear every 3 (three) days. 10 patch 0   No current facility-administered medications for this visit.    Review of Systems: No chest pain. No shortness of breath. No urinary complaints.   PHYSICAL EXAM :    Wt Readings from Last 3 Encounters:  08/16/21 144 lb 3.2 oz (65.4 kg)  08/05/21 156 lb 8.4 oz (71 kg)  07/16/21 160 lb 3.2 oz (72.7 kg)    LMP 07/05/2019  Constitutional:  Generally well appearing ***female in no acute distress. Psychiatric: Pleasant. Normal mood and affect. Behavior is normal. EENT: Pupils normal.  Conjunctivae are normal. No scleral icterus. Neck supple.  Cardiovascular: Normal rate, regular rhythm. No edema Pulmonary/chest: Effort normal and breath sounds normal. No wheezing, rales or  rhonchi. Abdominal: Soft, nondistended, nontender. Bowel sounds active throughout. There are no masses palpable. No hepatomegaly. Neurological: Alert and oriented to person place and time. Skin: Skin is warm and dry. No rashes noted.  Tye Savoy, NP  08/28/2021, 1:57 PM  Cc:  Lafonda Mosses, MD

## 2021-08-29 DIAGNOSIS — C569 Malignant neoplasm of unspecified ovary: Principal | ICD-10-CM

## 2021-08-31 IMAGING — XA IR IMAGING GUIDED PORT INSERTION
1 series · 1 of 1 positions shown · non-contrast
Comparison: none

CLINICAL DATA: Ovarian carcinoma and need for porta cath for
chemotherapy.

[Series 300: ir imaging guided port insertion · 1 of 1 slices shown]
[im 1/1]
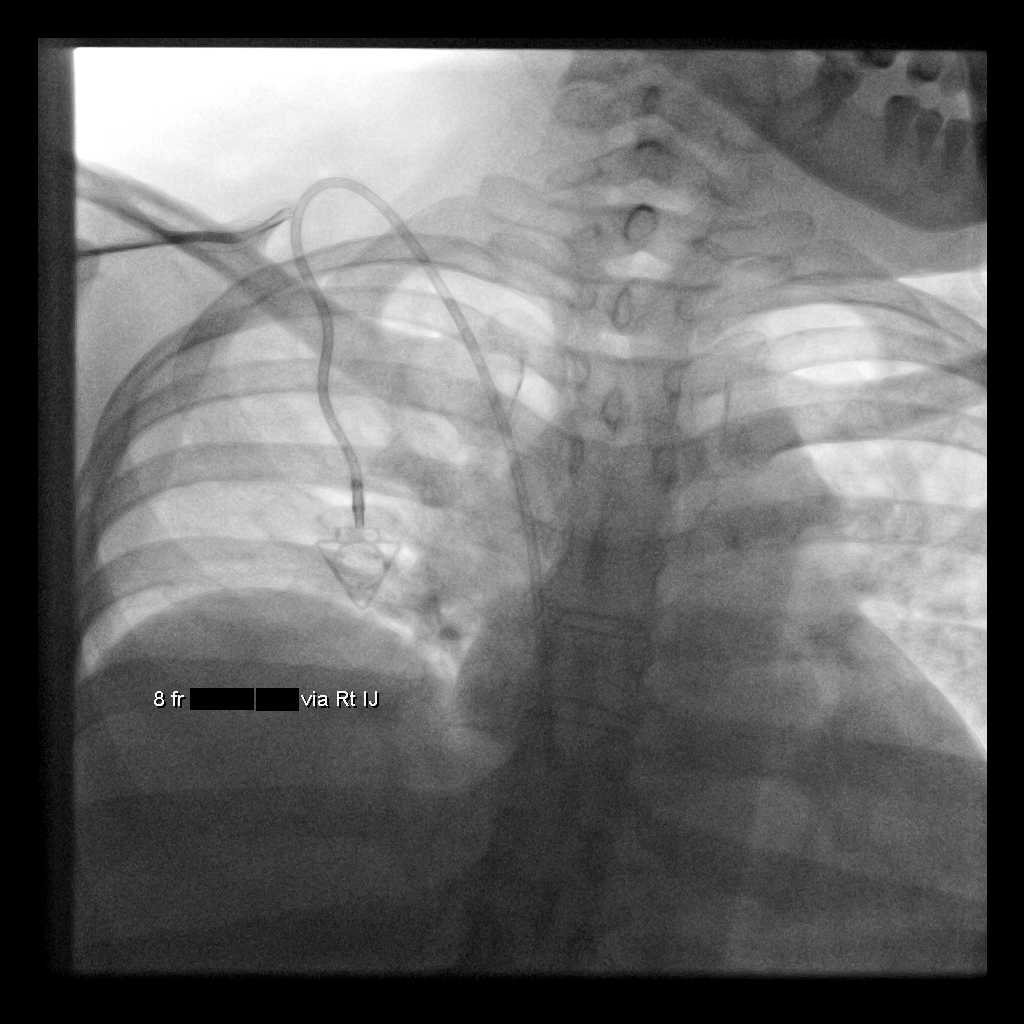

[1 of 1 positions shown; findings below may reference images not displayed]

EXAM:
IMPLANTED PORT A CATH PLACEMENT WITH ULTRASOUND AND FLUOROSCOPIC
GUIDANCE

ANESTHESIA/SEDATION:
3.0 mg IV Versed; 100 mcg IV Fentanyl

Total Moderate Sedation Time:  29 minutes

The patient's level of consciousness and physiologic status were
continuously monitored during the procedure by Radiology nursing.

Additional Medications: 2 g IV Ancef.

FLUOROSCOPY TIME:  36 seconds.  8.9 mGy.

PROCEDURE:
The procedure, risks, benefits, and alternatives were explained to
the patient. Questions regarding the procedure were encouraged and
answered. The patient understands and consents to the procedure. A
time-out was performed prior to initiating the procedure.

Ultrasound was utilized to confirm patency of the right internal
jugular vein. The right neck and chest were prepped with
chlorhexidine in a sterile fashion, and a sterile drape was applied
covering the operative field. Maximum barrier sterile technique with
sterile gowns and gloves were used for the procedure. Local
anesthesia was provided with 1% lidocaine.

After creating a small venotomy incision, a 21 gauge needle was
advanced into the right internal jugular vein under direct,
real-time ultrasound guidance. Ultrasound image documentation was
performed. After securing guidewire access, an 8 Fr dilator was
placed. A J-wire was kinked to measure appropriate catheter length.

A subcutaneous port pocket was then created along the upper chest
wall utilizing sharp and blunt dissection. Portable cautery was
utilized. The pocket was irrigated with sterile saline.

A single lumen power injectable port was chosen for placement. The 8
Fr catheter was tunneled from the port pocket site to the venotomy
incision. The port was placed in the pocket. External catheter was
trimmed to appropriate length based on guidewire measurement.

At the venotomy, an 8 Fr peel-away sheath was placed over a
guidewire. The catheter was then placed through the sheath and the
sheath removed. Final catheter positioning was confirmed and
documented with a fluoroscopic spot image. The port was accessed
with a needle and aspirated and flushed with heparinized saline. The
access needle was removed.

The venotomy and port pocket incisions were closed with subcutaneous
3-0 Monocryl and subcuticular 4-0 Vicryl. Dermabond was applied to
both incisions.

COMPLICATIONS:
COMPLICATIONS
None
FINDINGS: After catheter placement, the tip lies at the Rinae junction.
The catheter aspirates normally and is ready for immediate use.
IMPRESSION: Placement of single lumen port a cath via right internal jugular
vein. The catheter tip lies at the Rinae junction. A power
injectable port a cath was placed and is ready for immediate use.

## 2021-09-02 ENCOUNTER — Telehealth: Payer: Self-pay | Admitting: *Deleted

## 2021-09-02 NOTE — Telephone Encounter (Signed)
Fax and emailed the patient's husband's FMLA paperwork

## 2021-09-04 DIAGNOSIS — C569 Malignant neoplasm of unspecified ovary: Principal | ICD-10-CM

## 2021-09-04 MED ORDER — CYCLOPHOSPHAMIDE 50 MG CAPSULE
ORAL_CAPSULE | Freq: Every day | ORAL | 5 refills | 30 days | Status: CP
Start: 2021-09-04 — End: ?
  Filled 2021-09-12: qty 30, 30d supply, fill #0

## 2021-09-05 LAB — BASIC METABOLIC PANEL
ANION GAP: 7 mmol/L (ref 5–14)
BLOOD UREA NITROGEN: 22 mg/dL (ref 9–23)
BUN / CREAT RATIO: 65
CALCIUM: 8.1 mg/dL — ABNORMAL LOW (ref 8.7–10.4)
CHLORIDE: 102 mmol/L (ref 98–107)
CO2: 28 mmol/L (ref 20.0–31.0)
CREATININE: 0.34 mg/dL — ABNORMAL LOW
EGFR CKD-EPI (2021) FEMALE: >90 mL/min/{1.73_m2} (ref >=60)
GLUCOSE RANDOM: 277 mg/dL — ABNORMAL HIGH (ref 70–179)
POTASSIUM: 3.5 mmol/L (ref 3.4–4.8)
SODIUM: 137 mmol/L (ref 135–145)

## 2021-09-05 LAB — HEPATIC FUNCTION PANEL
ALBUMIN: 2.5 g/dL — ABNORMAL LOW (ref 3.4–5.0)
ALKALINE PHOSPHATASE: 65 U/L (ref 46–116)
ALT (SGPT): 18 U/L (ref 10–49)
AST (SGOT): 13 U/L (ref <=34)
BILIRUBIN DIRECT: <0.1 mg/dL (ref 0.00–0.30)
BILIRUBIN TOTAL: 0.2 mg/dL — ABNORMAL LOW (ref 0.3–1.2)
PROTEIN TOTAL: 5.2 g/dL — ABNORMAL LOW (ref 5.7–8.2)

## 2021-09-05 LAB — PHOSPHORUS: PHOSPHORUS: 4.3 mg/dL (ref 2.4–5.1)

## 2021-09-05 LAB — MAGNESIUM: MAGNESIUM: 1.9 mg/dL (ref 1.6–2.6)

## 2021-09-05 MED ADMIN — fat emulsion 20 % with fish oil (SMOFLIPID) infusion 250 mL: 250 mL | INTRAVENOUS | @ 05:00:00 | Stop: 2021-09-05

## 2021-09-05 MED ADMIN — octreotide (SandoSTATIN) injection 200 mcg: 200 ug | SUBCUTANEOUS | @ 22:00:00

## 2021-09-05 MED ADMIN — insulin NPH (HumuLIN,NovoLIN) injection 4 Units: 4 [IU] | SUBCUTANEOUS | @ 08:00:00 | Stop: 2021-09-05

## 2021-09-05 MED ADMIN — sodium chloride (NS) 0.9 % flush 10 mL: 10 mL | INTRAVENOUS

## 2021-09-05 MED ADMIN — octreotide (SandoSTATIN) injection 200 mcg: 200 ug | SUBCUTANEOUS | @ 15:00:00

## 2021-09-05 MED ADMIN — scopolamine (TRANSDERM-SCOP) 1 mg over 3 days topical patch 1 mg: 1 | TOPICAL | @ 21:00:00

## 2021-09-05 MED ADMIN — sodium chloride (NS) 0.9 % flush 10 mL: 10 mL | INTRAVENOUS | @ 05:00:00

## 2021-09-05 MED ADMIN — dexamethasone (DECADRON) 4 mg/mL injection 4 mg: 4 mg | INTRAVENOUS | @ 11:00:00

## 2021-09-05 MED ADMIN — metoclopramide (REGLAN) injection 5 mg: 5 mg | INTRAVENOUS | @ 15:00:00

## 2021-09-05 MED ADMIN — insulin regular (HumuLIN,NovoLIN) injection 0-20 Units: 0-20 [IU] | SUBCUTANEOUS | @ 11:00:00 | Stop: 2021-09-05

## 2021-09-05 MED ADMIN — insulin regular (HumuLIN,NovoLIN) injection 0-20 Units: 0-20 [IU] | SUBCUTANEOUS | @ 17:00:00

## 2021-09-05 MED ADMIN — sodium chloride (NS) 0.9 % flush 10 mL: 10 mL | INTRAVENOUS | @ 15:00:00

## 2021-09-05 MED ADMIN — Adult Parenteral Nutrition: INTRAVENOUS | @ 05:00:00 | Stop: 2021-09-05

## 2021-09-05 MED ADMIN — OLANZapine (ZyPREXA) tablet 2.5 mg: 2.5 mg | ORAL | @ 15:00:00

## 2021-09-05 MED ADMIN — pantoprazole (PROTONIX) injection 40 mg: 40 mg | INTRAVENOUS | @ 15:00:00

## 2021-09-05 NOTE — Unmapped (Cosign Needed)
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 Pricilla Holm  - s/p XL with radical tumor debulking including total hysterectomy, bilateral salpingo-oophorectomy, infra-gastric omentectomy, and washings on 07/21/2019  - s/p 6 cycles carbo/taxol, completed 12/05/19  -CT 2/022 concerning for progression, started on carbo/gem 2/22-5/24/22.  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 No definitive succus seen intra-op. Treated conservatively with bowel rest and TPN for 1 month then had several I&D of recollected abscess at site of EC fistula with drain placed and left until clinical and imaging evidence of resolution    - Last imaging: CTAP - 08/01/21 with SBO, stable trace ascites, and recurrent anterior abdominal wall fistula vs. Abscess.   -12/29-08/07/21 admission for partial SBO and abdominal wall fluid collection, biopsy of fluid collection consistent at first for upper GI or pancreatobiliary primary   - Last CA-125: 120 on 08/02/21.   - Per GOC discussion w/ Palliative on 1/30, pt wants to continue cancer-directed therapy      SBO   -continue NGT to LIS with mIVF  -Palliative on board, continue bowel regiment of senna, miralax, and dulcolax  -VIR consulted > TEG vs. G-tube placement did not occur 1/31 due to missing equipment that will not be available until 2/3.   - Repeat coag studies tomorrow AM  - CTAP on 1/29 to assess decompression per VIR: Overall, no significant change in appearance of complex, possibly closed loop, small bowel obstruction, multiple new locules of fluid and gas in the subcutaneous soft tissues, which may be related to enteric gas from enterocutaneous fistula versus superimposed associated abscesses; There appears to be irregular wall thickening of the medial gastric wall, GI consultation for possible endoscopy to exclude underlying gastric mass (primary or metastasis) is suggested.  -Surg oncology have signed off, defer G-tube to VIR as opposed to surgical management  -Nutrition consulted, TPN started 1/28    Abdominal wound/?Abscess at biopsy site   -brown drainage noted at bedside, no acute s/s infection   -wound dressing changed every other day   -was on empiric abx on admission which were dc'd on 1/16 after negative blood cultures      N/V  - Pall cs: Comp/Reglan, IV Dex, octreotide, scop patch, Dex BID  - Dex Taper schedule as follows: 4 mg daily x1 week (1/30-), 2 mg daily x1 week, 1 mg daily x1 week.    - Plan to discontinue Octreotide after tube placement     Pain   -Palliative following, pain well managed with fentanyl patc, IV morphine q4 PRN, 20mg /ml roxanol q4 hours PRN     Poor Nutrition   - Nutrition/Palliative following, on olanzapine   - TPN running (1/27-), cycling at night   - Start POC glucose checks q6h w/ SSI (1/30-) and 4 units NPH at time of TPN (2/1-)     Anxiety   -Palliative on board, rec considering hydroxyzine     Anemia  -likely chronic disease, no acute concern for bleeding  -s/p 1u PRBC  on 1/28 to optimize prior to chemotherapy, Hgb 7.3 > 9.1     Prophylaxis: Lovenox, will be held in preparation for procedure tomorrow.      Code Status: full code, confirmed on admission.      Disposition: floor, PT/OT consulted-->HH     Plan discussed with Dr. Merry Proud, Attending, Dr. Reyne Dumas immediately available.     Subjective:   Colleen Russo feels the best she has felt in weeks in regards to nausea and pain. She reports that her NGT was not to suction over night. She denies any nausea or vomiting this AM. No bowel movements but did pass flatus. Denies any CP/SOB. Ambulating and voiding without issue.     Objective:  Temp:  [36.5 ??C (97.7 ??F)-36.7 ??C (98.1 ??F)] 36.5 ??C (97.7 ??F)  Heart Rate:  [73-81] 73  Resp:  [16-18] 18  BP: (107-120)/(74-81) 120/81  SpO2:  [100 %] 100 %    Gen: NAD, awake, alert, oriented  CV: regular rate and rhythm   Pulm: clear to auscultation bilaterally, normal work of breathing  Abd:   Non-tender, non-distended. Hyperactive bowel sounds in all 4 quadrants. No rebound or guarding. Abdominal wound with dressing, clean and dry.   GU: deferred  NGT in place with bilious green output    Medications (scheduled)   ??? bisacodyL  10 mg Rectal Daily   ??? dexamethasone  4 mg Intravenous Q24H   ??? enoxaparin (LOVENOX) injection  40 mg Subcutaneous daily   ??? fentaNYL  1 patch Transdermal Q72H   ??? flu vacc qs2022-23 6mos up(PF)  0.5 mL Intramuscular During hospitalization   ??? insulin NPH  4 Units Subcutaneous Q24H   ??? insulin regular  0-20 Units Subcutaneous Q6H SCH   ??? metoclopramide  5 mg Intravenous Daily   ??? octreotide  200 mcg Subcutaneous TID   ??? OLANZapine  2.5 mg Oral Daily   ??? OLANZapine  5 mg Oral Nightly   ??? pantoprazole (PROTONIX) intravenous solutio  40 mg Intravenous BID   ??? polyethylene glycol  17 g Oral Daily   ??? scopolamine  1 patch Topical Q72H   ??? senna  4 tablet Oral Nightly   ??? sodium chloride  10 mL Intravenous Q8H   ??? sodium chloride  10 mL Intravenous Q8H   ??? sodium chloride  10 mL Intravenous Q8H       Medications (prn)  acetaminophen, calcium carbonate, dextrose in water, emollient combination no.92, glucagon, glucose, magic mouthwash oral, MORPhine, MORPhine injection, naloxone, OLANZapine, prochlorperazine, simethicone, tiZANidine    Labs:   Lab Results   Component Value Date    WBC 6.6 09/01/2021    HGB 9.1 (L) 09/01/2021    HCT 27.2 (L) 09/01/2021    PLT 228 09/01/2021       Lab Results   Component Value Date    NA 141 09/04/2021    K 3.6 09/04/2021    CL 104 09/04/2021 CO2 25.0 09/04/2021    BUN 19 09/04/2021    CREATININE 0.31 (L) 09/04/2021    GLU 164 09/04/2021    CALCIUM 8.3 (L) 09/04/2021    MG 1.8 09/04/2021    PHOS 4.1 09/04/2021       Lab Results   Component Value Date    BILITOT 0.2 (L) 09/02/2021    BILIDIR <0.10 09/02/2021    PROT 5.6 (L) 09/02/2021    ALBUMIN 2.9 (L) 09/02/2021    ALT 15 09/02/2021    AST 14 09/02/2021  ALKPHOS 63 09/02/2021       Lab Results   Component Value Date    INR 1.06 09/01/2021     Scribe's Attestation: Warden Fillers, MD obtained and performed the history, physical exam and medical decision making elements that were  entered into the chart. Documentation assistance was provided by me personally, a scribe. Signed by Freddie Apley Scribe, on September 05, 2021 at 5:12 AM.       ----------------------------------------------------------------------------------------------------------------------  September 05, 2021 6:28 AM. Documentation assistance provided by the Scribe. I was present during the time the encounter was recorded. The information recorded by the Scribe was done at my direction and has been reviewed and validated by me.  ----------------------------------------------------------------------------------------------------------------------    Everette Rank, MD  PGY1 OBGYN

## 2021-09-05 NOTE — Unmapped (Signed)
VSS overnight. Pt denied pain. TPN and lipids started @ 0019  through PICC. Labs drawn. Pt up to void; 500 mL this AM. NGT off at beginning of shift; Rn confirmed w/ MD this AM pt is to be on suction. NGT flushed and on low intermittent suction; volume recorded. Pt denies any nausea. Remains free from falls and other injuries. NPO status. Wound dressing remains CDI. Care ongoing.    Problem: Adult Inpatient Plan of Care  Goal: Plan of Care Review  Outcome: Progressing  Goal: Patient-Specific Goal (Individualized)  Outcome: Progressing  Goal: Absence of Hospital-Acquired Illness or Injury  Outcome: Progressing  Intervention: Identify and Manage Fall Risk  Recent Flowsheet Documentation  Taken 09/04/2021 1901 by Steele Berg, RN  Safety Interventions:   fall reduction program maintained   lighting adjusted for tasks/safety   low bed   nonskid shoes/slippers when out of bed  Intervention: Prevent Skin Injury  Recent Flowsheet Documentation  Taken 09/04/2021 1901 by Steele Berg, RN  Skin Protection: adhesive use limited  Intervention: Prevent and Manage VTE (Venous Thromboembolism) Risk  Recent Flowsheet Documentation  Taken 09/04/2021 1901 by Steele Berg, RN  Activity Management:   activity adjusted per tolerance   activity encouraged  Intervention: Prevent Infection  Recent Flowsheet Documentation  Taken 09/04/2021 1901 by Steele Berg, RN  Infection Prevention:   cohorting utilized   rest/sleep promoted  Goal: Optimal Comfort and Wellbeing  Outcome: Progressing  Goal: Readiness for Transition of Care  Outcome: Progressing  Goal: Rounds/Family Conference  Outcome: Progressing     Problem: Fall Injury Risk  Goal: Absence of Fall and Fall-Related Injury  Outcome: Progressing  Intervention: Promote Injury-Free Environment  Recent Flowsheet Documentation  Taken 09/04/2021 1901 by Steele Berg, RN  Safety Interventions:   fall reduction program maintained   lighting adjusted for tasks/safety   low bed   nonskid shoes/slippers when out of bed     Problem: Pain Acute  Goal: Acceptable Pain Control and Functional Ability  Outcome: Progressing     Problem: Pain Chronic (Persistent) (Comorbidity Management)  Goal: Acceptable Pain Control and Functional Ability  Outcome: Progressing

## 2021-09-05 NOTE — Unmapped (Signed)
[facility] SSC Specialty Medication Onboarding    Specialty Medication: Cyclophosphamide  Prior Authorization: Not Required   Financial Assistance: No - copay  <$25  Final Copay/Day Supply: $4 / 30    Insurance Restrictions: None     Notes to Pharmacist: Currently in patient    The triage team has completed the benefits investigation and has determined that the patient is able to fill this medication at [facility]. Please contact the patient to complete the onboarding or follow up with the prescribing physician as needed.

## 2021-09-06 LAB — URINALYSIS, MACROSCOPIC
BILIRUBIN UA: NEGATIVE
BLOOD UA: NEGATIVE
GLUCOSE UA: >1000 — AB
KETONES UA: NEGATIVE
LEUKOCYTE ESTERASE UA: NEGATIVE
NITRITE UA: NEGATIVE
PH UA: 6 (ref 5.0–9.0)
SPECIFIC GRAVITY UA: 1.029 (ref 1.003–1.030)
UROBILINOGEN UA: <2

## 2021-09-06 LAB — BASIC METABOLIC PANEL
ANION GAP: 9 mmol/L (ref 5–14)
BLOOD UREA NITROGEN: 19 mg/dL (ref 9–23)
BUN / CREAT RATIO: 53
CALCIUM: 8.5 mg/dL — ABNORMAL LOW (ref 8.7–10.4)
CHLORIDE: 103 mmol/L (ref 98–107)
CO2: 27 mmol/L (ref 20.0–31.0)
CREATININE: 0.36 mg/dL — ABNORMAL LOW
EGFR CKD-EPI (2021) FEMALE: >90 mL/min/{1.73_m2} (ref >=60)
GLUCOSE RANDOM: 265 mg/dL — ABNORMAL HIGH (ref 70–179)
POTASSIUM: 3.8 mmol/L (ref 3.4–4.8)
SODIUM: 139 mmol/L (ref 135–145)

## 2021-09-06 LAB — MAGNESIUM
MAGNESIUM: 1.9 mg/dL (ref 1.6–2.6)
MAGNESIUM: 1.9 mg/dL (ref 1.6–2.6)

## 2021-09-06 LAB — CBC W/ AUTO DIFF
BASOPHILS ABSOLUTE COUNT: 0 10*9/L (ref 0.0–0.1)
BASOPHILS RELATIVE PERCENT: 0.3 %
EOSINOPHILS ABSOLUTE COUNT: 0 10*9/L (ref 0.0–0.5)
EOSINOPHILS RELATIVE PERCENT: 0.8 %
HEMATOCRIT: 28.8 % — ABNORMAL LOW (ref 34.0–44.0)
HEMOGLOBIN: 9.4 g/dL — ABNORMAL LOW (ref 11.3–14.9)
LYMPHOCYTES ABSOLUTE COUNT: 1 10*9/L — ABNORMAL LOW (ref 1.1–3.6)
LYMPHOCYTES RELATIVE PERCENT: 20.6 %
MEAN CORPUSCULAR HEMOGLOBIN CONC: 32.7 g/dL (ref 32.0–36.0)
MEAN CORPUSCULAR HEMOGLOBIN: 26.9 pg (ref 25.9–32.4)
MEAN CORPUSCULAR VOLUME: 82.2 fL (ref 77.6–95.7)
MEAN PLATELET VOLUME: 7.5 fL (ref 6.8–10.7)
MONOCYTES ABSOLUTE COUNT: 0.3 10*9/L (ref 0.3–0.8)
MONOCYTES RELATIVE PERCENT: 7.3 %
NEUTROPHILS ABSOLUTE COUNT: 3.4 10*9/L (ref 1.8–7.8)
NEUTROPHILS RELATIVE PERCENT: 71 %
PLATELET COUNT: 223 10*9/L (ref 150–450)
RED BLOOD CELL COUNT: 3.5 10*12/L — ABNORMAL LOW (ref 3.95–5.13)
RED CELL DISTRIBUTION WIDTH: 16.6 % — ABNORMAL HIGH (ref 12.2–15.2)
WBC ADJUSTED: 4.8 10*9/L (ref 3.6–11.2)

## 2021-09-06 LAB — TSH: THYROID STIMULATING HORMONE: 0.237 u[IU]/mL — ABNORMAL LOW (ref 0.550–4.780)

## 2021-09-06 LAB — HEPATITIS B SURFACE ANTIGEN: HEPATITIS B SURFACE ANTIGEN: NONREACTIVE

## 2021-09-06 LAB — COMPREHENSIVE METABOLIC PANEL
ALBUMIN: 2.6 g/dL — ABNORMAL LOW (ref 3.4–5.0)
ALKALINE PHOSPHATASE: 70 U/L (ref 46–116)
ALT (SGPT): 27 U/L (ref 10–49)
ANION GAP: 8 mmol/L (ref 5–14)
AST (SGOT): 21 U/L (ref <=34)
BILIRUBIN TOTAL: 0.2 mg/dL — ABNORMAL LOW (ref 0.3–1.2)
BLOOD UREA NITROGEN: 24 mg/dL — ABNORMAL HIGH (ref 9–23)
BUN / CREAT RATIO: 69
CALCIUM: 8.4 mg/dL — ABNORMAL LOW (ref 8.7–10.4)
CHLORIDE: 104 mmol/L (ref 98–107)
CO2: 26 mmol/L (ref 20.0–31.0)
CREATININE: 0.35 mg/dL — ABNORMAL LOW
EGFR CKD-EPI (2021) FEMALE: >90 mL/min/{1.73_m2} (ref >=60)
GLUCOSE RANDOM: 267 mg/dL — ABNORMAL HIGH (ref 70–99)
POTASSIUM: 3.9 mmol/L (ref 3.5–5.1)
PROTEIN TOTAL: 5.3 g/dL — ABNORMAL LOW (ref 5.7–8.2)
SODIUM: 138 mmol/L (ref 135–145)

## 2021-09-06 LAB — HEPATITIS B SURFACE ANTIBODY
HEPATITIS B SURFACE ANTIBODY QUANT: <8 m[IU]/mL (ref <8.00)
HEPATITIS B SURFACE ANTIBODY: NONREACTIVE

## 2021-09-06 LAB — PROTIME-INR
INR: 1.06
PROTIME: 12 s (ref 9.8–12.8)

## 2021-09-06 LAB — PHOSPHORUS
PHOSPHORUS: 3.9 mg/dL (ref 2.4–5.1)
PHOSPHORUS: 4.1 mg/dL (ref 2.4–5.1)

## 2021-09-06 LAB — CA 125: CA 125: 180 U/mL — ABNORMAL HIGH (ref 0–35)

## 2021-09-06 LAB — T4, FREE: FREE T4: 0.54 ng/dL — ABNORMAL LOW (ref 0.89–1.76)

## 2021-09-06 LAB — APTT
APTT: 24.3 s — ABNORMAL LOW (ref 25.1–36.5)
HEPARIN CORRELATION: <0.2

## 2021-09-06 LAB — HEPATITIS B CORE ANTIBODY, IGM: HEPATITIS B CORE IGM ANTIBODY: NONREACTIVE

## 2021-09-06 MED ADMIN — iohexoL (OMNIPAQUE) 300 mg iodine/mL solution 50 mL: 50 mL | @ 21:00:00 | Stop: 2021-09-06

## 2021-09-06 MED ADMIN — insulin regular (HumuLIN,NovoLIN) injection 0-20 Units: 0-20 [IU] | SUBCUTANEOUS | @ 11:00:00

## 2021-09-06 MED ADMIN — dexamethasone (DECADRON) 4 mg/mL injection 4 mg: 4 mg | INTRAVENOUS | @ 11:00:00

## 2021-09-06 MED ADMIN — sodium chloride (NS) 0.9 % flush 10 mL: 10 mL | INTRAVENOUS | @ 06:00:00

## 2021-09-06 MED ADMIN — fentaNYL (PF) (SUBLIMAZE) injection: INTRAVENOUS | @ 20:00:00 | Stop: 2021-09-06

## 2021-09-06 MED ADMIN — fentaNYL (DURAGESIC) 50 mcg/hr 1 patch: 1 | TRANSDERMAL | @ 22:00:00 | Stop: 2021-09-15

## 2021-09-06 MED ADMIN — midazolam (VERSED) injection: INTRAVENOUS | @ 20:00:00 | Stop: 2021-09-06

## 2021-09-06 MED ADMIN — pantoprazole (PROTONIX) injection 40 mg: 40 mg | INTRAVENOUS | @ 14:00:00 | Stop: 2021-09-06

## 2021-09-06 MED ADMIN — Adult Parenteral Nutrition: INTRAVENOUS | @ 05:00:00 | Stop: 2021-09-06

## 2021-09-06 MED ADMIN — pantoprazole (PROTONIX) injection 40 mg: 40 mg | INTRAVENOUS | @ 02:00:00

## 2021-09-06 MED ADMIN — sodium chloride (NS) 0.9 % flush 10 mL: 10 mL | INTRAVENOUS | @ 14:00:00

## 2021-09-06 MED ADMIN — insulin NPH (HumuLIN,NovoLIN) injection 8 Units: 8 [IU] | SUBCUTANEOUS | @ 05:00:00

## 2021-09-06 MED ADMIN — OLANZapine (ZyPREXA) tablet 5 mg: 5 mg | ORAL | @ 02:00:00

## 2021-09-06 MED ADMIN — insulin regular (HumuLIN,NovoLIN) injection 0-20 Units: 0-20 [IU] | SUBCUTANEOUS | @ 18:00:00

## 2021-09-06 MED ADMIN — OLANZapine (ZyPREXA) tablet 2.5 mg: 2.5 mg | ORAL | @ 14:00:00

## 2021-09-06 MED ADMIN — metoclopramide (REGLAN) injection 5 mg: 5 mg | INTRAVENOUS | @ 14:00:00

## 2021-09-06 MED ADMIN — lidocaine (XYLOCAINE) 10 mg/mL (1 %) injection: INTRADERMAL | @ 20:00:00 | Stop: 2021-09-06

## 2021-09-06 MED ADMIN — octreotide (SandoSTATIN) injection 200 mcg: 200 ug | SUBCUTANEOUS | @ 02:00:00

## 2021-09-06 MED ADMIN — octreotide (SandoSTATIN) injection 200 mcg: 200 ug | SUBCUTANEOUS | @ 14:00:00 | Stop: 2021-09-06

## 2021-09-06 MED ADMIN — fat emulsion 20 % with fish oil (SMOFLIPID) infusion 250 mL: 250 mL | INTRAVENOUS | @ 05:00:00 | Stop: 2021-09-06

## 2021-09-06 MED ADMIN — ondansetron (ZOFRAN) injection: INTRAVENOUS | @ 21:00:00 | Stop: 2021-09-06

## 2021-09-06 MED ADMIN — octreotide (SandoSTATIN) injection 200 mcg: 200 ug | SUBCUTANEOUS | Stop: 2021-09-07

## 2021-09-06 MED ADMIN — MORPhine 4 mg/mL injection 4 mg: 4 mg | INTRAVENOUS | Stop: 2021-09-19

## 2021-09-06 MED ADMIN — insulin regular (HumuLIN,NovoLIN) injection 0-20 Units: 0-20 [IU] | SUBCUTANEOUS | @ 06:00:00

## 2021-09-06 MED ADMIN — sodium chloride (NS) 0.9 % infusion: INTRAVENOUS | @ 20:00:00 | Stop: 2021-09-06

## 2021-09-06 NOTE — Unmapped (Signed)
BP soft at 93/67.HR and O2 sats WNL.Urine seemed diminished on this shift/voided before 7pm and hasn't been to the bathroom since.Pt had no intake since take down of TPN as she is is still on NPO.BS WNL until TPN was hung at ordered rate which were 120 and 208 respectively.Given her her Insulin coverage as ordered.Will monitor  Problem: Adult Inpatient Plan of Care  Goal: Plan of Care Review  Outcome: Ongoing - Unchanged  Goal: Patient-Specific Goal (Individualized)  Outcome: Ongoing - Unchanged  Goal: Absence of Hospital-Acquired Illness or Injury  Outcome: Ongoing - Unchanged  Intervention: Identify and Manage Fall Risk  Recent Flowsheet Documentation  Taken 09/05/2021 2100 by Olam Idler, RN  Safety Interventions:   lighting adjusted for tasks/safety   low bed  Intervention: Prevent Skin Injury  Recent Flowsheet Documentation  Taken 09/05/2021 2100 by Olam Idler, RN  Skin Protection:   transparent dressing maintained   protective footwear used  Intervention: Prevent and Manage VTE (Venous Thromboembolism) Risk  Recent Flowsheet Documentation  Taken 09/05/2021 2100 by Olam Idler, RN  Activity Management: activity adjusted per tolerance  Intervention: Prevent Infection  Recent Flowsheet Documentation  Taken 09/05/2021 2100 by Olam Idler, RN  Infection Prevention: rest/sleep promoted  Goal: Optimal Comfort and Wellbeing  Outcome: Ongoing - Unchanged  Goal: Readiness for Transition of Care  Outcome: Ongoing - Unchanged  Goal: Rounds/Family Conference  Outcome: Ongoing - Unchanged

## 2021-09-06 NOTE — Unmapped (Signed)
Assessment/Plan:    [name] is a 46 y.o. female who will undergo transesophageal gastrostomy tube in Interventional Radiology. All patients questions were answered    HPI: [name] is a 46 y.o. female SBO due to metastatic ovarian cancer.    Allergies:   Allergies   Allergen Reactions   ??? Carboplatin Shortness Of Breath   ??? Ketamine Other (See Comments)     Rigors, nausea       Medications:  No relevant medications, please see full medication list in Epic.    ASA Grade: ASA 3 - Patient with moderate systemic disease with functional limitations    PSH: No past surgical history on file.    PMH: No past medical history on file.    PE:    Vitals:    09/06/21 1440   BP: 97/68   Pulse: 81   Resp: 19   Temp:    SpO2: 100%     General: WD, WN female in NAD.   HEENT: Normocephalic, atraumatic.   Lungs: Respirations nonlabored  Mallampati Class:  Class II        Janett Labella, MD  09/06/2021, 2:45 PM

## 2021-09-06 NOTE — Unmapped (Signed)
[place] INTERVENTIONAL RADIOLOGY - Operative Note     VIR Post-Procedure Note    Procedure Name: Decompressive percutaneous transesophageal gastrostomy tube.     Pre-Op Diagnosis: Chronic SBO in the setting of     Post-Op Diagnosis: Same as pre-operative diagnosis    VIR Providers    Operator: Rob Bunting Pisanie    Time out: Prior to the procedure, a time out was performed with all team members present. During the time out, the patient, procedure and procedure site when applicable were verbally verified by the team members and Rob Bunting Pisanie.    Description of procedure: Successful placement of a percutaneous transesophageal gastrostomy tube for decompression.  Left-sided neck.    Sedation:Moderate sedation    Estimated Blood Loss: approximately <5 mL  Complications: None    See detailed procedure note with images in PACS [facility]).    The patient tolerated the procedure well without incident or complication and left the room in stable condition.    Plan: 24 hours straight drain.     Rob Bunting Pisanie  09/06/2021 3:44 PM

## 2021-09-06 NOTE — Unmapped (Signed)
Patient VSS stable. COP reviewed with patient.  Patient remains free of infection and cohort utilized when caring for PICC. Patient has no complaint of pain. Patient worked with PT today. Patient remains free of falls.   Problem: Adult Inpatient Plan of Care  Goal: Plan of Care Review  Outcome: Progressing  Goal: Patient-Specific Goal (Individualized)  Outcome: Progressing  Goal: Absence of Hospital-Acquired Illness or Injury  Outcome: Progressing  Intervention: Identify and Manage Fall Risk  Recent Flowsheet Documentation  Taken 09/05/2021 0705 by Trenton Gammon, RN  Safety Interventions:   low bed   fall reduction program maintained   lighting adjusted for tasks/safety  Intervention: Prevent Infection  Recent Flowsheet Documentation  Taken 09/05/2021 0705 by Trenton Gammon, RN  Infection Prevention:   cohorting utilized   hand hygiene promoted   rest/sleep promoted  Goal: Optimal Comfort and Wellbeing  Outcome: Progressing  Goal: Readiness for Transition of Care  Outcome: Progressing  Goal: Rounds/Family Conference  Outcome: Progressing     Problem: Fall Injury Risk  Goal: Absence of Fall and Fall-Related Injury  Outcome: Progressing  Intervention: Promote Injury-Free Environment  Recent Flowsheet Documentation  Taken 09/05/2021 0705 by Trenton Gammon, RN  Safety Interventions:   low bed   fall reduction program maintained   lighting adjusted for tasks/safety     Problem: Pain Acute  Goal: Acceptable Pain Control and Functional Ability  Outcome: Progressing     Problem: Pain Chronic (Persistent) (Comorbidity Management)  Goal: Acceptable Pain Control and Functional Ability  Outcome: Progressing

## 2021-09-06 NOTE — Unmapped (Signed)
Pharmacy: First Cycle Chemotherapy Patient Education  Colleen Russo is a 46 y.o. year old with ovarian cancer who was admitted to the [facility] and will receive her first cycle of chemotherapy in the outpatient setting. Patient education reviewing the chemotherapy was provided to the patient by the oncology pharmacy team.  Patient and her husband were present for the education.      Chemotherapy regimen/agents: pembrolizumab and oral cyclophosphamide  Day 1 of chemotherapy: to be determined after discharge  Central Venous Access Device: PICC    For each of the following items listed below and where applicable, signs and symptoms of adverse effects as well as self-care management were reviewed. Additionally, chemotherapy scheduling, chemotherapy agents, mechanism of action, exposure risk mitigation for caregivers, drug-drug/food/herbal interactions and supportive care measures were also reviewed with the patient.     Side effects discussed included but were not limited to: immune related toxicities, fatigue, neutropenia, nausea/vomiting, diarrhea, skin rash and pruritis.    A/P:  1. The patient verbalized understanding of this information. The patient's drug profile was reviewed for drug interactions with the chemotherapy and no interations were present at the time of review. The patient was given educational materials that included resources from [employer].    Approximate time spent with patient: 20 minutes.    Thank you, please feel free to contact with any questions or concerns.    Monte Fantasia,  PharmD, BCPS  Clinical Pharmacist

## 2021-09-06 NOTE — Unmapped (Signed)
Palliative Care Progress Note    Consultation from Requesting Attending Physician:  Dossie Der,*  Primary Care Provider:  Carver Fila, MD  Code Status: Full      Assessment/Recommendation:   This 46 y.o. patient is seriously ill due to SBOs, complicated by co-morbid acute and chronic conditions including PMHx of clear-cell ovarian cancer??(diagnosed 07/2019),??multiple SBOs and abdominal surgeries c/b entero-cutaneous fistula and abscess??that presented to??[facility]??with??several days of severe nausea vomiting, abdominal pain and cramping. ??Found to have malignant SBO. We are following for symptom management     Follow recs for discharge (please send 30 day supply for meds):   - Fentanyl patch 50 mcg q72hr  - Oxycodone 5 mg q4h prn breakthrough pain (send 20 tabs). Has not needed breakthrough pain meds here  - Decadron taper: 4 mg daily x1 week (1/31-2/6), 2 mg daily x1 week (2/7-2/13), 1 mg daily x1 week (2/14-2/20, off  - Oral metoclopramide 5 mg daily scheduled with BID PRN nausea available.   - Oral olanzapine 2.5 mg morning and 5 mg nightly   - Scopolamine patch 1 patch q72 hours   - Miralax daily schedule and up to 3 times a day as needed   - Senna 2 tabs BID PRN constipation  - Bisacodyl suppository 10 mg q daily prn for no BM in 72 hours  - please send outpatient palliative care referral since she is being seen at [facility] for ongoing oncology care    Symptom Assessment and Recommendations:    #Pain due to partial SBO and cancer related abdominal process:   Well controlled, no PRNs needed  - Continue fentanyl patch at 50 mcg  - Continue 5 mg of highly concentrated 20 mg/ml roxanol for pt Q 4 hrs PRN.   - Keep naloxone PRN for respiratory depression  ??  #Nausea/Vomiting due to SBO: intermittent nausea, well controlled at this time.  -Continue decadron 4 mg daily x1 week. Taper schedule as follows: 4 mg daily x1 week, 2 mg daily x1 week, 1 mg daily x1 week.   []  decrease PPI from BID to once daily   - Continue compazine 5 mg Q8 hrs PO PRN N/V  - Continue scopolamine patch q 3days- need to watch for sedation  - Continue 5.0 mg PO olanzapine QHS and olanzapine 2.5 mg in the AM and 2.5 mg once daily PRN  []  Discontinue octreotide on 09/06/21 (after TEG placed)  -Continue senna 4 tabs daily (home regimen)  -Cont Miralax 17 gm daily  -daily dulcolax supp 10 mg    # Insomnia/Poor Appetite  - Continue olanzapine    #Anxiety -  - continue hydroxyzine 25 mg q 6 hours PRN acute exacerbation    Goals of Care and Decision Making:     Decisional capacity at time of visit:  Full    Healthcare Decision Maker if lacks capacity: Name:  husband Molly Maduro, confirmed 09/06/2021    Current Goals of care: Cancer-directed therapy in hopes to live as long as possible. Hoping for [place] and cytoxin outpatient   ??  Counseling and Coordination - Practical, Emotional, Spiritual Support Needs:    Palliative care visit today included focused interview, active listening, therapeutic use of silence, offering support, sharing empathy and summarization of today's discussion. Doing better with nursing.    Communicated directly with gyn onc team    Palliative care plans to follow up on Monday 09/09/21     Thank you for this consult. Please Epic Secure Chat me ([phone])  or page Palliative Care [phone]) on weekends/after hours if there are any questions.     Wanita Chamberlain, MD  [facility] Palliative Care Attending    Subjective:  NEW EVENTS:  Symptoms well controlled. She's excited to go get procedure today so she can go home early next week. She was told Keytruda and Cytoxin are treatment options    Review of Systems:  A 12 system review of systems was negative except as noted in HPI.    Objective:     Temp:  [36.4 ??C (97.5 ??F)-37 ??C (98.6 ??F)] 36.9 ??C (98.4 ??F)  Heart Rate:  [70-80] 80  Resp:  [18] 18  BP: (93-117)/(67-80) 109/74  SpO2:  [99 %-100 %] 99 %    No intake/output data recorded.    Physical Exam:  General/Constitutional: awake, sitting in bed, conversant   Eyes: EOMI  HENT: NGT in place  Respiratory: no increase in WOB  Neuro/Psych: alert, directed, euthymic, calm    Test Results:  Lab Results   Component Value Date    WBC 4.8 09/06/2021    RBC 3.50 (L) 09/06/2021    HGB 9.4 (L) 09/06/2021    HCT 28.8 (L) 09/06/2021    MCV 82.2 09/06/2021    MCH 26.9 09/06/2021    MCHC 32.7 09/06/2021    RDW 16.6 (H) 09/06/2021    PLT 223 09/06/2021    MPV 7.5 09/06/2021     Lab Results   Component Value Date    NA 139 09/06/2021    NA 138 09/06/2021    K 3.8 09/06/2021    K 3.9 09/06/2021    CL 103 09/06/2021    CL 104 09/06/2021    CO2 27.0 09/06/2021    CO2 26.0 09/06/2021    BUN 19 09/06/2021    BUN 24 (H) 09/06/2021    CREATININE 0.36 (L) 09/06/2021    CREATININE 0.35 (L) 09/06/2021    GLU 265 (H) 09/06/2021    GLU 267 (H) 09/06/2021    CALCIUM 8.5 (L) 09/06/2021    CALCIUM 8.4 (L) 09/06/2021    ALBUMIN 2.6 (L) 09/06/2021    PHOS 3.9 09/06/2021    PHOS 4.1 09/06/2021      Lab Results   Component Value Date    ALKPHOS 70 09/06/2021    BILITOT 0.2 (L) 09/06/2021    BILIDIR <0.10 09/05/2021    PROT 5.3 (L) 09/06/2021    ALBUMIN 2.6 (L) 09/06/2021    ALT 27 09/06/2021    AST 21 09/06/2021         I personally spent 55 minutes face-to-face and non-face-to-face in the care of this patient, which includes all pre, intra, and post visit time on the date of service.  All documented time was specific to the E/M visit and does not include any procedures that may have been performed.

## 2021-09-06 NOTE — Unmapped (Signed)
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 Pricilla Holm  - s/p XL with radical tumor debulking including total hysterectomy, bilateral salpingo-oophorectomy, infra-gastric omentectomy, and washings on 07/21/2019  - s/p 6 cycles carbo/taxol, completed 12/05/19  -CT 2/022 concerning for progression, started on carbo/gem 2/22-5/24/22.  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 No definitive succus seen intra-op. Treated conservatively with bowel rest and TPN for 1 month then had several I&D of recollected abscess at site of EC fistula with drain placed and left until clinical and imaging evidence of resolution    - Last imaging: CTAP - 08/01/21 with SBO, stable trace ascites, and recurrent anterior abdominal wall fistula vs. Abscess.   -12/29-08/07/21 admission for partial SBO and abdominal wall fluid collection, biopsy of fluid collection consistent at first for upper GI or pancreatobiliary primary   - Last CA-125: 120 on 08/02/21.   - Per GOC discussion w/ Palliative on 1/30, pt wants to continue cancer-directed therapy      SBO   -continue NGT to LIS with mIVF  -Palliative on board, continue bowel regiment of senna, miralax, and dulcolax  -VIR consulted > TEG vs. G-tube placement today  - Repeat coag studies this AM within limits for VIR procedure  - CTAP on 1/29 to assess decompression per VIR: Overall, no significant change in appearance of complex, possibly closed loop, small bowel obstruction, multiple new locules of fluid and gas in the subcutaneous soft tissues, which may be related to enteric gas from enterocutaneous fistula versus superimposed associated abscesses; There appears to be irregular wall thickening of the medial gastric wall, GI consultation for possible endoscopy to exclude underlying gastric mass (primary or metastasis) is suggested.  -Surg oncology have signed off, defer G-tube to VIR as opposed to surgical management  -Nutrition consulted, TPN started 1/28    Abdominal wound/?Abscess at biopsy site   -brown drainage noted at bedside, no acute s/s infection   -wound dressing changed every other day   -was on empiric abx on admission which were dc'd on 1/16 after negative blood cultures      N/V  - Pall cs: Comp/Reglan, IV Dex, octreotide, scop patch, Dex BID  - Dex Taper schedule as follows: 4 mg daily x1 week (1/30-), 2 mg daily x1 week, 1 mg daily x1 week.    - Plan to discontinue Octreotide after tube placement     Pain   -Palliative following, pain well managed with fentanyl patc, IV morphine q4 PRN, 20mg /ml roxanol q4 hours PRN     Poor Nutrition   - Nutrition/Palliative following, on olanzapine   - TPN running (1/27-), cycling at night   - Start POC glucose checks q6h w/ SSI (1/30-) and 4 units NPH at time of TPN (2/1) > NPH increased to 8 units last night     Anxiety   -Palliative on board, rec considering hydroxyzine     Anemia  -likely chronic disease, no acute concern for bleeding  -s/p 1u PRBC on 1/28  to optimize prior to chemotherapy, Hgb 7.3 > 9.1    Thyroid Function  - TSH .237, Free T4 .54  - Consistent with Sick Euthyroid Syndrome      Prophylaxis: Lovenox, will be held in preparation for procedure tomorrow.      Code Status: full code, confirmed on admission.      Disposition: floor, PT/OT consulted-->HH     Plan discussed with Dr. Merry Proud, Attending, Dr. Reyne Dumas immediately available.     Subjective:  Colleen Russo feels well this AM and is excited to have her G tube placed and have chemo teaching. She has no pain, nausea or vomiting. She is passing flatus.     Objective:  Temp:  [36.4 ??C (97.5 ??F)-37 ??C (98.6 ??F)] 36.4 ??C (97.5 ??F)  Heart Rate:  [70-85] 70  Resp:  [16-18] 18  BP: (93-117)/(67-80) 93/67  SpO2:  [98 %-100 %] 100 %    Gen: NAD, awake, alert, oriented  CV: regular rate and rhythm   Pulm: clear to auscultation bilaterally, normal work of breathing  Abd:   Non-tender, non-distended. Hyperactive bowel sounds in all 4 quadrants. No rebound or guarding. Abdominal wound with dressing, clean and dry.   GU: deferred  NGT in place with bilious green output    Medications (scheduled)   ??? bisacodyL  10 mg Rectal Daily   ??? dexamethasone  4 mg Intravenous Q24H   ??? fentaNYL  1 patch Transdermal Q72H   ??? flu vacc qs2022-23 6mos up(PF)  0.5 mL Intramuscular During hospitalization   ??? insulin NPH  8 Units Subcutaneous Q24H   ??? insulin regular  0-20 Units Subcutaneous Q6H SCH   ??? metoclopramide  5 mg Intravenous Daily   ??? octreotide  200 mcg Subcutaneous TID   ??? OLANZapine  2.5 mg Oral Daily   ??? OLANZapine  5 mg Oral Nightly   ??? pantoprazole (PROTONIX) intravenous solutio  40 mg Intravenous BID   ??? polyethylene glycol  17 g Oral Daily   ??? scopolamine  1 patch Topical Q72H   ??? senna  4 tablet Oral Nightly   ??? sodium chloride  10 mL Intravenous Q8H   ??? sodium chloride  10 mL Intravenous Q8H   ??? sodium chloride  10 mL Intravenous Q8H       Medications (prn)  acetaminophen, calcium carbonate, dextrose in water, emollient combination no.92, glucagon, glucose, magic mouthwash oral, MORPhine, MORPhine injection, naloxone, OLANZapine, prochlorperazine, simethicone, tiZANidine    Labs:   Lab Results   Component Value Date    WBC 4.8 09/06/2021    HGB 9.4 (L) 09/06/2021    HCT 28.8 (L) 09/06/2021    PLT 223 09/06/2021       Lab Results   Component Value Date    NA 139 09/06/2021    NA 138 09/06/2021    K 3.8 09/06/2021    K 3.9 09/06/2021    CL 103 09/06/2021    CL 104 09/06/2021    CO2 27.0 09/06/2021    CO2 26.0 09/06/2021 BUN 19 09/06/2021    BUN 24 (H) 09/06/2021    CREATININE 0.36 (L) 09/06/2021    CREATININE 0.35 (L) 09/06/2021    GLU 265 (H) 09/06/2021    GLU 267 (H) 09/06/2021    CALCIUM 8.5 (L) 09/06/2021    CALCIUM 8.4 (L) 09/06/2021    MG 1.9 09/06/2021    MG 1.9 09/06/2021    PHOS 3.9 09/06/2021    PHOS 4.1 09/06/2021  Lab Results   Component Value Date    BILITOT 0.2 (L) 09/06/2021    BILIDIR <0.10 09/05/2021    PROT 5.3 (L) 09/06/2021    ALBUMIN 2.6 (L) 09/06/2021    ALT 27 09/06/2021    AST 21 09/06/2021    ALKPHOS 70 09/06/2021       Lab Results   Component Value Date    INR 1.06 09/06/2021    APTT 24.3 (L) 09/06/2021     Scribe's Attestation: Warden Fillers, MD obtained and performed the history, physical exam and medical decision making elements that were  entered into the chart. Documentation assistance was provided by me personally, a scribe. Signed by Tinnie Gens Scribe, on September 06, 2021 at 5:19 AM.         ----------------------------------------------------------------------------------------------------------------------  September 06, 2021 6:28 AM. Documentation assistance provided by the Scribe. I was present during the time the encounter was recorded. The information recorded by the Scribe was done at my direction and has been reviewed and validated by me.  ----------------------------------------------------------------------------------------------------------------------

## 2021-09-07 LAB — PHOSPHORUS: PHOSPHORUS: 4.1 mg/dL (ref 2.4–5.1)

## 2021-09-07 LAB — BASIC METABOLIC PANEL
ANION GAP: 8 mmol/L (ref 5–14)
BLOOD UREA NITROGEN: 18 mg/dL (ref 9–23)
BUN / CREAT RATIO: 50
CALCIUM: 8.2 mg/dL — ABNORMAL LOW (ref 8.7–10.4)
CHLORIDE: 107 mmol/L (ref 98–107)
CO2: 25 mmol/L (ref 20.0–31.0)
CREATININE: 0.36 mg/dL — ABNORMAL LOW
EGFR CKD-EPI (2021) FEMALE: >90 mL/min/{1.73_m2} (ref >=60)
GLUCOSE RANDOM: 227 mg/dL — ABNORMAL HIGH (ref 70–179)
POTASSIUM: 4 mmol/L (ref 3.4–4.8)
SODIUM: 140 mmol/L (ref 135–145)

## 2021-09-07 LAB — TRIGLYCERIDES: TRIGLYCERIDES: 113 mg/dL (ref 0–150)

## 2021-09-07 LAB — MAGNESIUM: MAGNESIUM: 1.9 mg/dL (ref 1.6–2.6)

## 2021-09-07 MED ADMIN — octreotide (SandoSTATIN) injection 200 mcg: 200 ug | SUBCUTANEOUS | @ 19:00:00 | Stop: 2021-09-07

## 2021-09-07 MED ADMIN — sodium chloride (NS) 0.9 % flush 10 mL: 10 mL | INTRAVENOUS | @ 15:00:00

## 2021-09-07 MED ADMIN — insulin regular (HumuLIN,NovoLIN) injection 0-20 Units: 0-20 [IU] | SUBCUTANEOUS | @ 11:00:00

## 2021-09-07 MED ADMIN — fat emulsion 20 % with fish oil (SMOFLIPID) infusion 250 mL: 250 mL | INTRAVENOUS | @ 05:00:00 | Stop: 2021-09-07

## 2021-09-07 MED ADMIN — sodium chloride (NS) 0.9 % flush 10 mL: 10 mL | INTRAVENOUS | @ 05:00:00

## 2021-09-07 MED ADMIN — sodium chloride (NS) 0.9 % flush 10 mL: 10 mL | INTRAVENOUS | @ 23:00:00

## 2021-09-07 MED ADMIN — sodium chloride (NS) 0.9 % flush 10 mL: 10 mL | INTRAVENOUS

## 2021-09-07 MED ADMIN — alteplase (ACTIVASE) injection small catheter clearance 1 mg: 1 mg | INTRAVENOUS | @ 21:00:00 | Stop: 2021-09-07

## 2021-09-07 MED ADMIN — metoclopramide (REGLAN) injection 5 mg: 5 mg | INTRAVENOUS | @ 14:00:00

## 2021-09-07 MED ADMIN — dexamethasone (DECADRON) 4 mg/mL injection 4 mg: 4 mg | INTRAVENOUS | @ 11:00:00

## 2021-09-07 MED ADMIN — Adult Parenteral Nutrition: INTRAVENOUS | @ 05:00:00 | Stop: 2021-09-07

## 2021-09-07 MED ADMIN — insulin regular (HumuLIN,NovoLIN) injection 0-20 Units: 0-20 [IU] | SUBCUTANEOUS | @ 18:00:00

## 2021-09-07 MED ADMIN — sodium chloride (NS) 0.9 % flush 10 mL: 10 mL | INTRAVENOUS | @ 06:00:00

## 2021-09-07 MED ADMIN — octreotide (SandoSTATIN) injection 200 mcg: 200 ug | SUBCUTANEOUS | @ 15:00:00 | Stop: 2021-09-07

## 2021-09-07 MED ADMIN — pantoprazole (PROTONIX) injection 40 mg: 40 mg | INTRAVENOUS | @ 14:00:00

## 2021-09-07 MED ADMIN — OLANZapine (ZyPREXA) tablet 5 mg: 5 mg | ORAL | @ 02:00:00

## 2021-09-07 MED ADMIN — OLANZapine (ZyPREXA) tablet 2.5 mg: 2.5 mg | ORAL | @ 14:00:00

## 2021-09-07 MED ADMIN — insulin NPH (HumuLIN,NovoLIN) injection 10 Units: 10 [IU] | SUBCUTANEOUS | @ 05:00:00 | Stop: 2021-09-07

## 2021-09-07 MED ADMIN — enoxaparin (LOVENOX) syringe 40 mg: 40 mg | SUBCUTANEOUS | @ 14:00:00

## 2021-09-07 NOTE — Unmapped (Signed)
VSS. TPN stopped as ordered. Had transesophageal gastrostomy tube placed in VIR. Tube noted to left neck with statlock open upon her arrival to unit, no bleeding observed at site and tube appeared to be in place, VIR called and MD came to unit, changed statlock and confirmed that tube remained intact. NG tube was removed in VIR. She was given Morphine 1ml IV for TEG site pain 8/10. She remains npo. She is passing flatus but denied BM safety measures in place. Will continue to monitor.  Problem: Adult Inpatient Plan of Care  Goal: Plan of Care Review  Outcome: Progressing  Goal: Patient-Specific Goal (Individualized)  Outcome: Progressing  Goal: Absence of Hospital-Acquired Illness or Injury  Outcome: Progressing  Intervention: Identify and Manage Fall Risk  Recent Flowsheet Documentation  Taken 09/06/2021 0715 by Hoyt Koch, RN  Safety Interventions:   fall reduction program maintained   lighting adjusted for tasks/safety   low bed   nonskid shoes/slippers when out of bed  Intervention: Prevent Infection  Recent Flowsheet Documentation  Taken 09/06/2021 0715 by Hoyt Koch, RN  Infection Prevention: rest/sleep promoted  Goal: Optimal Comfort and Wellbeing  Outcome: Progressing  Goal: Readiness for Transition of Care  Outcome: Progressing  Goal: Rounds/Family Conference  Outcome: Progressing     Problem: Fall Injury Risk  Goal: Absence of Fall and Fall-Related Injury  Outcome: Progressing  Intervention: Promote Injury-Free Environment  Recent Flowsheet Documentation  Taken 09/06/2021 0715 by Hoyt Koch, RN  Safety Interventions:   fall reduction program maintained   lighting adjusted for tasks/safety   low bed   nonskid shoes/slippers when out of bed     Problem: Pain Acute  Goal: Acceptable Pain Control and Functional Ability  Outcome: Progressing     Problem: Pain Chronic (Persistent) (Comorbidity Management)  Goal: Acceptable Pain Control and Functional Ability  Outcome: Progressing

## 2021-09-07 NOTE — Unmapped (Cosign Needed)
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 Pricilla Holm  - s/p XL with radical tumor debulking including total hysterectomy, bilateral salpingo-oophorectomy, infra-gastric omentectomy, and washings on 07/21/2019  - s/p 6 cycles carbo/taxol, completed 12/05/19  -CT 2/022 concerning for progression, started on carbo/gem 2/22-5/24/22.  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 No definitive succus seen intra-op. Treated conservatively with bowel rest and TPN for 1 month then had several I&D of recollected abscess at site of EC fistula with drain placed and left until clinical and imaging evidence of resolution    - Last imaging: CTAP - 08/01/21 with SBO, stable trace ascites, and recurrent anterior abdominal wall fistula vs. Abscess.   -12/29-08/07/21 admission for partial SBO and abdominal wall fluid collection, biopsy of fluid collection consistent at first for upper GI or pancreatobiliary primary   - Last CA-125: 120 on 08/02/21.   - Per GOC discussion w/ Palliative on 1/30, pt wants to continue cancer-directed therapy      SBO   -NGT deferred after TEG placement   -Palliative on board, continue bowel regiment of senna, miralax, and dulcolax  -VIR consulted > TEG placement 2/3  - CTAP on 1/29 to assess decompression per VIR: Overall, no significant change in appearance of complex, possibly closed loop, small bowel obstruction, multiple new locules of fluid and gas in the subcutaneous soft tissues, which may be related to enteric gas from enterocutaneous fistula versus superimposed associated abscesses; There appears to be irregular wall thickening of the medial gastric wall, GI consultation for possible endoscopy to exclude underlying gastric mass (primary or metastasis) is suggested.  -Surg oncology have signed off, defer G-tube to VIR as opposed to surgical management  -Nutrition consulted, TPN started (1/28- )    Abdominal wound/?Abscess at biopsy site   -brown drainage noted at bedside, no acute s/s infection   -wound dressing changed every other day   -was on empiric abx on admission which were dc'd on 1/16 after negative blood cultures      N/V  - Pall cs: Comp/Reglan, IV Dex, scop patch, Dex BID  - Dex Taper schedule as follows: 4 mg daily x1 week (1/30-), 2 mg daily x1 week, 1 mg daily x1 week.      Pain   -Palliative following, pain well managed with fentanyl patch, 20mg /ml roxanol q4 hours PRN, Naloxone PRN      Poor Nutrition   - Nutrition/Palliative following, on olanzapine   - TPN running (1/27-), cycling at night   - Start POC glucose checks q6h w/ SSI (1/30-) and 4 units NPH at time of TPN (2/1) > NPH increased to 8 units 2/2     Anxiety   -Palliative on board, rec considering hydroxyzine     Anemia  -likely chronic disease, no acute concern for bleeding  -s/p 1u PRBC on 1/28 to optimize prior to chemotherapy, Hgb 7.3 > 9.1    Thyroid Function  - TSH .237, Free T4 .54  - Consistent with Sick  Euthyroid Syndrome      Prophylaxis: Lovenox, will be held in preparation for procedure tomorrow.      Code Status: full code, confirmed on admission.      Disposition: floor, PT/OT consulted-->HH     Plan discussed with Dr. Merry Proud, Attending, Dr. Reyne Dumas immediately available.     Subjective:  Patient feels well this AM and is feeling much better after TEG placement. She reports much less suffocation after her fluid was drained. Patient is optimistic about her treatment and brought flowers for the floor. She is excited to discharge on Monday.     Objective:  Temp:  [36.4 ??C (97.6 ??F)-37.1 ??C (98.8 ??F)] 36.4 ??C (97.6 ??F)  Heart Rate:  [73-125] 73  Resp:  [12-25] 16  BP: (95-139)/(68-104) 105/74  SpO2:  [98 %-100 %] 98 %    Gen: NAD, awake, alert, oriented  CV: regular rate and rhythm   Pulm: clear to auscultation bilaterally, normal work of breathing  Abd:   Non-tender, non-distended. Hyperactive bowel sounds in all 4 quadrants. No rebound or guarding. Abdominal wound with dressing, clean and dry.   GU: deferred    Medications (scheduled)   ??? bisacodyL  10 mg Rectal Daily   ??? dexamethasone  4 mg Intravenous Q24H   ??? enoxaparin (LOVENOX) injection  40 mg Subcutaneous Daily   ??? fentaNYL  1 patch Transdermal Q72H   ??? flu vacc qs2022-23 6mos up(PF)  0.5 mL Intramuscular During hospitalization   ??? insulin NPH  10 Units Subcutaneous Q24H   ??? insulin regular  0-20 Units Subcutaneous Q6H SCH   ??? metoclopramide  5 mg Intravenous Daily   ??? octreotide  200 mcg Subcutaneous TID   ??? OLANZapine  2.5 mg Oral Daily   ??? OLANZapine  5 mg Oral Nightly   ??? pantoprazole (PROTONIX) intravenous solutio  40 mg Intravenous Daily   ??? polyethylene glycol  17 g Oral Daily   ??? scopolamine  1 patch Topical Q72H   ??? senna  4 tablet Oral Nightly   ??? sodium chloride  10 mL Intravenous Q8H   ??? sodium chloride  10 mL Intravenous Q8H   ??? sodium chloride  10 mL Intravenous Q8H       Medications (prn)  acetaminophen, calcium carbonate, dextrose in water, emollient combination no.92, glucagon, glucose, magic mouthwash oral, MORPhine, MORPhine injection, naloxone, OLANZapine, prochlorperazine, simethicone, tiZANidine    Labs:   Lab Results   Component Value Date    WBC 4.8 09/06/2021    HGB 9.4 (L) 09/06/2021    HCT 28.8 (L) 09/06/2021    PLT 223 09/06/2021       Lab Results   Component Value Date    NA 139 09/06/2021    NA 138 09/06/2021    K 3.8 09/06/2021    K 3.9 09/06/2021    CL 103 09/06/2021    CL 104 09/06/2021    CO2 27.0 09/06/2021    CO2 26.0 09/06/2021    BUN 19 09/06/2021 BUN 24 (H) 09/06/2021    CREATININE 0.36 (L) 09/06/2021    CREATININE 0.35 (L) 09/06/2021    GLU 265 (H) 09/06/2021    GLU 267 (H) 09/06/2021    CALCIUM 8.5 (L) 09/06/2021    CALCIUM 8.4 (L) 09/06/2021    MG 1.9 09/06/2021    MG 1.9 09/06/2021    PHOS 3.9 09/06/2021    PHOS 4.1 09/06/2021       Lab Results   Component Value Date  BILITOT 0.2 (L) 09/06/2021    BILIDIR <0.10 09/05/2021    PROT 5.3 (L) 09/06/2021    ALBUMIN 2.6 (L) 09/06/2021    ALT 27 09/06/2021    AST 21 09/06/2021    ALKPHOS 70 09/06/2021       Lab Results   Component Value Date    INR 1.06 09/06/2021    APTT 24.3 (L) 09/06/2021     Scribe's Attestation: Simonne Martinet, MD obtained and performed the history, physical exam and medical decision making elements that were  entered into the chart. Documentation assistance was provided by me personally, a scribe. Signed by Tinnie Gens Scribe, on September 07, 2021 at 5:55 AM.     ----------------------------------------------------------------------------------------------------------------------  September 07, 2021 10:59 AM. Documentation assistance provided by the Scribe. I was present during the time the encounter was recorded. The information recorded by the Scribe was done at my direction and has been reviewed and validated by me.  ----------------------------------------------------------------------------------------------------------------------

## 2021-09-07 NOTE — Unmapped (Signed)
VSS,afebrile.Percutaneous Transesophageal Gtube intact draining to small amount of brownish color output via gravity bag.Sleeping for the most part of the night since pt came back from her procedure and after given Morphine by AM shift.Pt hasn't required/requested pain med since.BS WNL.Denied nause/vomiting TPN ongoing at ordered rate.Will monitor  Problem: Adult Inpatient Plan of Care  Goal: Plan of Care Review  Outcome: Ongoing - Unchanged  Goal: Patient-Specific Goal (Individualized)  Outcome: Ongoing - Unchanged  Goal: Absence of Hospital-Acquired Illness or Injury  Outcome: Ongoing - Unchanged  Intervention: Identify and Manage Fall Risk  Recent Flowsheet Documentation  Taken 09/06/2021 2100 by Olam Idler, RN  Safety Interventions:   low bed   lighting adjusted for tasks/safety  Taken 09/06/2021 1900 by Olam Idler, RN  Safety Interventions:   low bed   lighting adjusted for tasks/safety  Intervention: Prevent Skin Injury  Recent Flowsheet Documentation  Taken 09/06/2021 2100 by Olam Idler, RN  Skin Protection:   tubing/devices free from skin contact   protective footwear used  Taken 09/06/2021 1900 by Olam Idler, RN  Skin Protection:   transparent dressing maintained   protective footwear used  Intervention: Prevent and Manage VTE (Venous Thromboembolism) Risk  Recent Flowsheet Documentation  Taken 09/06/2021 2100 by Olam Idler, RN  Activity Management: activity adjusted per tolerance  Taken 09/06/2021 1900 by Olam Idler, RN  Activity Management: activity adjusted per tolerance  Goal: Optimal Comfort and Wellbeing  Outcome: Ongoing - Unchanged  Goal: Readiness for Transition of Care  Outcome: Ongoing - Unchanged  Goal: Rounds/Family Conference  Outcome: Ongoing - Unchanged

## 2021-09-08 LAB — BASIC METABOLIC PANEL
ANION GAP: 11 mmol/L (ref 5–14)
BLOOD UREA NITROGEN: 22 mg/dL (ref 9–23)
BUN / CREAT RATIO: 61
CALCIUM: 8.3 mg/dL — ABNORMAL LOW (ref 8.7–10.4)
CHLORIDE: 106 mmol/L (ref 98–107)
CO2: 22 mmol/L (ref 20.0–31.0)
CREATININE: 0.36 mg/dL — ABNORMAL LOW
EGFR CKD-EPI (2021) FEMALE: >90 mL/min/{1.73_m2} (ref >=60)
GLUCOSE RANDOM: 286 mg/dL — ABNORMAL HIGH (ref 70–179)
POTASSIUM: 3.5 mmol/L (ref 3.4–4.8)
SODIUM: 139 mmol/L (ref 135–145)

## 2021-09-08 LAB — LACTATE, VENOUS, WHOLE BLOOD: LACTATE BLOOD VENOUS: 1.5 mmol/L (ref 0.5–1.8)

## 2021-09-08 LAB — CBC
HEMATOCRIT: 34.6 % (ref 34.0–44.0)
HEMOGLOBIN: 11.1 g/dL — ABNORMAL LOW (ref 11.3–14.9)
MEAN CORPUSCULAR HEMOGLOBIN CONC: 32 g/dL (ref 32.0–36.0)
MEAN CORPUSCULAR HEMOGLOBIN: 27 pg (ref 25.9–32.4)
MEAN CORPUSCULAR VOLUME: 84.4 fL (ref 77.6–95.7)
MEAN PLATELET VOLUME: 8.2 fL (ref 6.8–10.7)
PLATELET COUNT: 224 10*9/L (ref 150–450)
RED BLOOD CELL COUNT: 4.11 10*12/L (ref 3.95–5.13)
RED CELL DISTRIBUTION WIDTH: 16.3 % — ABNORMAL HIGH (ref 12.2–15.2)
WBC ADJUSTED: 13.4 10*9/L — ABNORMAL HIGH (ref 3.6–11.2)

## 2021-09-08 LAB — HEPATIC FUNCTION PANEL
ALBUMIN: 3 g/dL — ABNORMAL LOW (ref 3.4–5.0)
ALKALINE PHOSPHATASE: 84 U/L (ref 46–116)
ALT (SGPT): 53 U/L — ABNORMAL HIGH (ref 10–49)
AST (SGOT): 31 U/L (ref <=34)
BILIRUBIN DIRECT: 0.1 mg/dL (ref 0.00–0.30)
BILIRUBIN TOTAL: 0.3 mg/dL (ref 0.3–1.2)
PROTEIN TOTAL: 5.9 g/dL (ref 5.7–8.2)

## 2021-09-08 LAB — MAGNESIUM: MAGNESIUM: 1.8 mg/dL (ref 1.6–2.6)

## 2021-09-08 LAB — PHOSPHORUS: PHOSPHORUS: 3.5 mg/dL (ref 2.4–5.1)

## 2021-09-08 MED ADMIN — metoclopramide (REGLAN) injection 5 mg: 5 mg | INTRAVENOUS | @ 14:00:00 | Stop: 2021-09-08

## 2021-09-08 MED ADMIN — insulin regular (HumuLIN,NovoLIN) injection 0-20 Units: 0-20 [IU] | SUBCUTANEOUS | @ 05:00:00

## 2021-09-08 MED ADMIN — dexamethasone (DECADRON) 4 mg/mL injection 4 mg: 4 mg | INTRAVENOUS | @ 11:00:00

## 2021-09-08 MED ADMIN — lidocaine (LIDODERM) 5 % patch 1 patch: 1 | TRANSDERMAL | @ 14:00:00

## 2021-09-08 MED ADMIN — insulin NPH (HumuLIN,NovoLIN) injection 12 Units: 12 [IU] | SUBCUTANEOUS | @ 05:00:00 | Stop: 2021-09-08

## 2021-09-08 MED ADMIN — pantoprazole (PROTONIX) injection 40 mg: 40 mg | INTRAVENOUS | @ 14:00:00

## 2021-09-08 MED ADMIN — Adult Parenteral Nutrition: INTRAVENOUS | @ 05:00:00 | Stop: 2021-09-08

## 2021-09-08 MED ADMIN — sodium chloride (NS) 0.9 % flush 10 mL: 10 mL | INTRAVENOUS

## 2021-09-08 MED ADMIN — sodium chloride (NS) 0.9 % flush 10 mL: 10 mL | INTRAVENOUS | @ 11:00:00

## 2021-09-08 MED ADMIN — sodium chloride (NS) 0.9 % flush 10 mL: 10 mL | INTRAVENOUS | @ 15:00:00

## 2021-09-08 MED ADMIN — MORPhine 4 mg/mL injection 4 mg: 4 mg | INTRAVENOUS | @ 20:00:00 | Stop: 2021-09-19

## 2021-09-08 MED ADMIN — insulin regular (HumuLIN,NovoLIN) injection 0-20 Units: 0-20 [IU] | SUBCUTANEOUS | @ 17:00:00

## 2021-09-08 MED ADMIN — insulin regular (HumuLIN,NovoLIN) injection 0-20 Units: 0-20 [IU] | SUBCUTANEOUS | @ 12:00:00

## 2021-09-08 MED ADMIN — fat emulsion 20 % with fish oil (SMOFLIPID) infusion 250 mL: 250 mL | INTRAVENOUS | @ 05:00:00 | Stop: 2021-09-08

## 2021-09-08 MED ADMIN — OLANZapine (ZyPREXA) tablet 5 mg: 5 mg | ORAL | @ 03:00:00

## 2021-09-08 MED ADMIN — scopolamine (TRANSDERM-SCOP) 1 mg over 3 days topical patch 1 mg: 1 | TOPICAL | @ 21:00:00

## 2021-09-08 MED ADMIN — enoxaparin (LOVENOX) syringe 40 mg: 40 mg | SUBCUTANEOUS | @ 14:00:00

## 2021-09-08 MED ADMIN — OLANZapine (ZyPREXA) tablet 2.5 mg: 2.5 mg | ORAL | @ 14:00:00

## 2021-09-08 MED ADMIN — diphenoxylate-atropine (LOMOTIL) 2.5-0.025 mg per tablet 1 tablet: 1 | ORAL | @ 12:00:00 | Stop: 2021-09-08

## 2021-09-08 MED ADMIN — lactated ringers bolus 1,000 mL: 1000 mL | INTRAVENOUS | @ 14:00:00 | Stop: 2021-09-08

## 2021-09-08 MED ADMIN — MORPhine 4 mg/mL injection 4 mg: 4 mg | INTRAVENOUS | @ 12:00:00 | Stop: 2021-09-19

## 2021-09-08 NOTE — Unmapped (Cosign Needed)
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 Pricilla Holm  - s/p XL with radical tumor debulking including total hysterectomy, bilateral salpingo-oophorectomy, infra-gastric omentectomy, and washings on 07/21/2019  - s/p 6 cycles carbo/taxol, completed 12/05/19  -CT 2/022 concerning for progression, started on carbo/gem 2/22-5/24/22.  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 No definitive succus seen intra-op. Treated conservatively with bowel rest and TPN for 1 month then had several I&D of recollected abscess at site of EC fistula with drain placed and left until clinical and imaging evidence of resolution    - Last imaging: CTAP - 08/01/21 with SBO, stable trace ascites, and recurrent anterior abdominal wall fistula vs. Abscess.   -12/29-08/07/21 admission for partial SBO and abdominal wall fluid collection, biopsy of fluid collection consistent at first for upper GI or pancreatobiliary primary   - Last CA-125: 120 on 08/02/21.   - Per GOC discussion w/ Palliative on 1/30, pt wants to continue cancer-directed therapy      SBO   -NGT deferred after TEG placement   -Palliative on board, continue bowel regiment of senna, miralax, and dulcolax  -VIR consulted > TEG placement 2/3  - CTAP on 1/29 to assess decompression per VIR: Overall, no significant change in appearance of complex, possibly closed loop, small bowel obstruction, multiple new locules of fluid and gas in the subcutaneous soft tissues, which may be related to enteric gas from enterocutaneous fistula versus superimposed associated abscesses; There appears to be irregular wall thickening of the medial gastric wall, GI consultation for possible endoscopy to exclude underlying gastric mass (primary or metastasis) is suggested.  -Surg oncology have signed off, defer G-tube to VIR as opposed to surgical management  -Nutrition consulted, TPN started (1/28- )  - The patient had several bouts of diarrhea overnight. She is tachycardic this morning so we  Will give 1L LR bolus.     Abdominal wound/?Abscess at biopsy site   -brown drainage noted at bedside, no acute s/s infection   -wound dressing changed every other day   -was on empiric abx on admission which were dc'd on 1/16 after negative blood cultures      N/V  - Pall cs: Comp/Reglan, IV Dex, scop patch, Dex BID  - Dex Taper schedule as follows: 4 mg daily x1 week (1/30-), 2 mg daily x1 week, 1 mg daily x1 week.      Pain   -Palliative following, pain well managed with fentanyl patch, 20mg /ml roxanol q4 hours PRN, Naloxone PRN      Poor Nutrition   - Nutrition/Palliative following, on olanzapine   - TPN running (1/27-), cycling at night > advances to [facility]  - Start POC glucose checks q6h w/ SSI (1/30-) and 4 units NPH at time of TPN (2/1) > NPH increased to 12 units (2/4)     Anxiety   -Palliative on board, rec considering hydroxyzine     Anemia  -likely chronic disease, no acute concern for bleeding  -s/p 1u PRBC on  1/28 to optimize prior to chemotherapy, Hgb 7.3 > 9.1    Thyroid Function  - TSH .237, Free T4 .54  - Consistent with Sick Euthyroid Syndrome      Prophylaxis: Lovenox, will be held in preparation for procedure tomorrow.      Code Status: full code, confirmed on admission.      Disposition: floor, PT/OT consulted-->HH     Plan discussed with Dr. Merry Proud, Attending, Dr. Reyne Dumas immediately available.     Subjective:  Patient is in a lot of discomfort this morning as she was up a lot of the night with diarrhea. She was able to tolerate sips last night and is otherwise feeling well.     Objective:  Temp:  [36.7 ??C (98.1 ??F)-37.3 ??C (99.1 ??F)] 37.3 ??C (99.1 ??F)  Heart Rate:  [86-127] 121  Resp:  [16-18] 18  BP: (108-121)/(79-85) 108/80  SpO2:  [97 %-100 %] 97 %    Gen: NAD, awake, alert, oriented  CV: regular rate and rhythm   Pulm: clear to auscultation bilaterally, normal work of breathing  Abd:   Non-tender, non-distended. Hyperactive bowel sounds in all 4 quadrants. No rebound or guarding. Abdominal wound with dressing, clean and dry.   GU: deferred    Medications (scheduled)   ??? alteplase  1 mg Intravenous Once   ??? dexamethasone  4 mg Intravenous Q24H   ??? enoxaparin (LOVENOX) injection  40 mg Subcutaneous Daily   ??? fentaNYL  1 patch Transdermal Q72H   ??? flu vacc qs2022-23 6mos up(PF)  0.5 mL Intramuscular During hospitalization   ??? insulin NPH  12 Units Subcutaneous Q24H   ??? insulin regular  0-20 Units Subcutaneous Q6H SCH   ??? lactated ringers  1,000 mL Intravenous Once   ??? lidocaine  1 patch Transdermal Daily   ??? metoclopramide  5 mg Intravenous Daily   ??? OLANZapine  2.5 mg Oral Daily   ??? OLANZapine  5 mg Oral Nightly   ??? pantoprazole (PROTONIX) intravenous solutio  40 mg Intravenous Daily   ??? scopolamine  1 patch Topical Q72H   ??? sodium chloride  10 mL Intravenous Q8H   ??? sodium chloride  10 mL Intravenous Q8H   ??? sodium chloride  10 mL Intravenous Q8H       Medications (prn)  acetaminophen, alteplase **AND** alteplase, [COMPLETED] alteplase **AND** alteplase, bisacodyL, calcium carbonate, dextrose in water, emollient combination no.92, glucagon, glucose, magic mouthwash oral, metoclopramide, MORPhine, MORPhine injection, naloxone, OLANZapine, polyethylene glycol, senna, simethicone, tiZANidine    Labs:   Lab Results   Component Value Date    WBC 4.8 09/06/2021    HGB 9.4 (L) 09/06/2021    HCT 28.8 (L) 09/06/2021    PLT 223 09/06/2021       Lab Results   Component Value Date    NA 139 09/08/2021    K 3.5 09/08/2021    CL 106 09/08/2021    CO2 22.0 09/08/2021    BUN 22 09/08/2021    CREATININE 0.36 (L) 09/08/2021    GLU 286 (H) 09/08/2021    CALCIUM 8.3 (L) 09/08/2021    MG 1.8 09/08/2021    PHOS 3.5 09/08/2021       Lab Results   Component Value Date    BILITOT 0.3 09/08/2021    BILIDIR 0.10 09/08/2021    PROT 5.9 09/08/2021    ALBUMIN 3.0 (L) 09/08/2021    ALT 53 (H) 09/08/2021    AST 31 09/08/2021  ALKPHOS 84 09/08/2021       Lab Results   Component Value Date    INR 1.06 09/06/2021    APTT 24.3 (L) 09/06/2021

## 2021-09-08 NOTE — Unmapped (Addendum)
VSS. NO falls or s/s of infection during shift. No complaints of pain. Ambulating to restroom, tolerating well. Tolerating clear liquid diet. TPN hung as ordered. BM overnight. No complaints of N/V. 1 episode of hyperglycemia, insulin given per order. Discussed plan of care, all questions answered. Report given to oncoming RN.      Problem: Adult Inpatient Plan of Care  Goal: Plan of Care Review  Outcome: Progressing  Goal: Patient-Specific Goal (Individualized)  Outcome: Progressing  Goal: Absence of Hospital-Acquired Illness or Injury  Outcome: Progressing  Intervention: Identify and Manage Fall Risk  Recent Flowsheet Documentation  Taken 09/08/2021 0500 by Virgil Benedict, RN  Safety Interventions:  ??? fall reduction program maintained  ??? lighting adjusted for tasks/safety  ??? low bed  ??? nonskid shoes/slippers when out of bed  Taken 09/08/2021 0300 by Virgil Benedict, RN  Safety Interventions:  ??? fall reduction program maintained  ??? lighting adjusted for tasks/safety  ??? low bed  ??? nonskid shoes/slippers when out of bed  Taken 09/08/2021 0100 by Virgil Benedict, RN  Safety Interventions:  ??? fall reduction program maintained  ??? lighting adjusted for tasks/safety  ??? low bed  ??? nonskid shoes/slippers when out of bed  Taken 09/07/2021 2300 by Virgil Benedict, RN  Safety Interventions:  ??? fall reduction program maintained  ??? lighting adjusted for tasks/safety  ??? low bed  ??? nonskid shoes/slippers when out of bed  Taken 09/07/2021 2100 by Virgil Benedict, RN  Safety Interventions:  ??? fall reduction program maintained  ??? low bed  ??? lighting adjusted for tasks/safety  ??? nonskid shoes/slippers when out of bed  Taken 09/07/2021 1902 by Virgil Benedict, RN  Safety Interventions:  ??? fall reduction program maintained  ??? lighting adjusted for tasks/safety  ??? low bed  ??? nonskid shoes/slippers when out of bed  Intervention: Prevent Skin Injury  Recent Flowsheet Documentation  Taken 09/08/2021 0500 by Virgil Benedict, RN  Skin Protection: adhesive use limited  Taken 09/08/2021 0300 by Virgil Benedict, RN  Skin Protection: adhesive use limited  Taken 09/08/2021 0100 by Virgil Benedict, RN  Skin Protection: adhesive use limited  Taken 09/07/2021 2300 by Virgil Benedict, RN  Skin Protection: adhesive use limited  Taken 09/07/2021 2100 by Virgil Benedict, RN  Skin Protection: adhesive use limited  Taken 09/07/2021 1902 by Virgil Benedict, RN  Skin Protection: adhesive use limited  Intervention: Prevent and Manage VTE (Venous Thromboembolism) Risk  Recent Flowsheet Documentation  Taken 09/07/2021 1902 by Virgil Benedict, RN  Activity Management: activity adjusted per tolerance  Intervention: Prevent Infection  Recent Flowsheet Documentation  Taken 09/08/2021 0500 by Virgil Benedict, RN  Infection Prevention:  ??? hand hygiene promoted  ??? single patient room provided  Taken 09/08/2021 0300 by Virgil Benedict, RN  Infection Prevention:  ??? hand hygiene promoted  ??? rest/sleep promoted  Taken 09/08/2021 0100 by Virgil Benedict, RN  Infection Prevention:  ??? hand hygiene promoted  ??? rest/sleep promoted  Taken 09/07/2021 2300 by Virgil Benedict, RN  Infection Prevention:  ??? hand hygiene promoted  ??? rest/sleep promoted  Taken 09/07/2021 2100 by Virgil Benedict, RN  Infection Prevention:  ??? hand hygiene promoted  ??? rest/sleep promoted  Taken 09/07/2021 1902 by Virgil Benedict, RN  Infection Prevention:  ??? hand hygiene promoted  ??? rest/sleep promoted  Goal: Optimal Comfort and Wellbeing  Outcome: Progressing  Goal: Readiness for Transition of Care  Outcome:  Progressing  Goal: Rounds/Family Conference  Outcome: Progressing     Problem: Fall Injury Risk  Goal: Absence of Fall and Fall-Related Injury  Outcome: Progressing  Intervention: Promote Scientist, clinical (histocompatibility and immunogenetics) Documentation  Taken 09/08/2021 0500 by Virgil Benedict, RN  Safety Interventions:  ??? fall reduction program maintained  ??? lighting adjusted for tasks/safety  ??? low bed  ??? nonskid shoes/slippers when out of bed  Taken 09/08/2021 0300 by Virgil Benedict, RN  Safety Interventions:  ??? fall reduction program maintained  ??? lighting adjusted for tasks/safety  ??? low bed  ??? nonskid shoes/slippers when out of bed  Taken 09/08/2021 0100 by Virgil Benedict, RN  Safety Interventions:  ??? fall reduction program maintained  ??? lighting adjusted for tasks/safety  ??? low bed  ??? nonskid shoes/slippers when out of bed  Taken 09/07/2021 2300 by Virgil Benedict, RN  Safety Interventions:  ??? fall reduction program maintained  ??? lighting adjusted for tasks/safety  ??? low bed  ??? nonskid shoes/slippers when out of bed  Taken 09/07/2021 2100 by Virgil Benedict, RN  Safety Interventions:  ??? fall reduction program maintained  ??? low bed  ??? lighting adjusted for tasks/safety  ??? nonskid shoes/slippers when out of bed  Taken 09/07/2021 1902 by Virgil Benedict, RN  Safety Interventions:  ??? fall reduction program maintained  ??? lighting adjusted for tasks/safety  ??? low bed  ??? nonskid shoes/slippers when out of bed     Problem: Pain Acute  Goal: Acceptable Pain Control and Functional Ability  Outcome: Progressing     Problem: Pain Chronic (Persistent) (Comorbidity Management)  Goal: Acceptable Pain Control and Functional Ability  Outcome: Progressing

## 2021-09-08 NOTE — Unmapped (Signed)
Plan of care reviewed with patient. Vital signs are stable. UOP adequate. Patient has been transitioned to clear liquid diet. Glucose was a critical value. MD Carlynn Purl notified; see MAR for intervention. Dressing change due tomorrow (every other day). No drainage from fistula. Active GI tract. No BM. 2/3 lumens for PICC had no blood return. Alteplase given. Blood return is brisk.  Problem: Adult Inpatient Plan of Care  Goal: Plan of Care Review  Outcome: Progressing  Goal: Patient-Specific Goal (Individualized)  Outcome: Progressing  Goal: Absence of Hospital-Acquired Illness or Injury  Outcome: Progressing  Intervention: Identify and Manage Fall Risk  Recent Flowsheet Documentation  Taken 09/07/2021 0902 by Little Ishikawa, RN  Safety Interventions: fall reduction program maintained  Taken 09/07/2021 0702 by Little Ishikawa, RN  Safety Interventions: fall reduction program maintained  Intervention: Prevent Skin Injury  Recent Flowsheet Documentation  Taken 09/07/2021 0702 by Little Ishikawa, RN  Skin Protection: tubing/devices free from skin contact  Intervention: Prevent Infection  Recent Flowsheet Documentation  Taken 09/07/2021 0702 by Little Ishikawa, RN  Infection Prevention: rest/sleep promoted  Goal: Optimal Comfort and Wellbeing  Outcome: Progressing  Goal: Readiness for Transition of Care  Outcome: Progressing  Goal: Rounds/Family Conference  Outcome: Progressing     Problem: Fall Injury Risk  Goal: Absence of Fall and Fall-Related Injury  Outcome: Progressing  Intervention: Promote Injury-Free Environment  Recent Flowsheet Documentation  Taken 09/07/2021 0902 by Little Ishikawa, RN  Safety Interventions: fall reduction program maintained  Taken 09/07/2021 0702 by Little Ishikawa, RN  Safety Interventions: fall reduction program maintained     Problem: Pain Acute  Goal: Acceptable Pain Control and Functional Ability  Outcome: Progressing     Problem: Pain Chronic (Persistent) (Comorbidity Management)  Goal: Acceptable Pain Control and Functional Ability  Outcome: Progressing

## 2021-09-09 LAB — CBC W/ AUTO DIFF
BASOPHILS ABSOLUTE COUNT: 0 10*9/L (ref 0.0–0.1)
BASOPHILS RELATIVE PERCENT: 0.2 %
EOSINOPHILS ABSOLUTE COUNT: 0.1 10*9/L (ref 0.0–0.5)
EOSINOPHILS RELATIVE PERCENT: 0.6 %
HEMATOCRIT: 32.7 % — ABNORMAL LOW (ref 34.0–44.0)
HEMOGLOBIN: 10.7 g/dL — ABNORMAL LOW (ref 11.3–14.9)
LYMPHOCYTES ABSOLUTE COUNT: 0.9 10*9/L — ABNORMAL LOW (ref 1.1–3.6)
LYMPHOCYTES RELATIVE PERCENT: 10.5 %
MEAN CORPUSCULAR HEMOGLOBIN CONC: 32.8 g/dL (ref 32.0–36.0)
MEAN CORPUSCULAR HEMOGLOBIN: 27.1 pg (ref 25.9–32.4)
MEAN CORPUSCULAR VOLUME: 82.7 fL (ref 77.6–95.7)
MEAN PLATELET VOLUME: 8.6 fL (ref 6.8–10.7)
MONOCYTES ABSOLUTE COUNT: 0.4 10*9/L (ref 0.3–0.8)
MONOCYTES RELATIVE PERCENT: 4.2 %
NEUTROPHILS ABSOLUTE COUNT: 7.6 10*9/L (ref 1.8–7.8)
NEUTROPHILS RELATIVE PERCENT: 84.5 %
PLATELET COUNT: 223 10*9/L (ref 150–450)
RED BLOOD CELL COUNT: 3.96 10*12/L (ref 3.95–5.13)
RED CELL DISTRIBUTION WIDTH: 16.5 % — ABNORMAL HIGH (ref 12.2–15.2)
WBC ADJUSTED: 9 10*9/L (ref 3.6–11.2)

## 2021-09-09 LAB — BASIC METABOLIC PANEL
ANION GAP: 8 mmol/L (ref 5–14)
BLOOD UREA NITROGEN: 20 mg/dL (ref 9–23)
BUN / CREAT RATIO: 53
CALCIUM: 8 mg/dL — ABNORMAL LOW (ref 8.7–10.4)
CHLORIDE: 103 mmol/L (ref 98–107)
CO2: 25 mmol/L (ref 20.0–31.0)
CREATININE: 0.38 mg/dL — ABNORMAL LOW
EGFR CKD-EPI (2021) FEMALE: >90 mL/min/{1.73_m2} (ref >=60)
GLUCOSE RANDOM: 307 mg/dL — ABNORMAL HIGH (ref 70–179)
POTASSIUM: 3.8 mmol/L (ref 3.4–4.8)
SODIUM: 136 mmol/L (ref 135–145)

## 2021-09-09 LAB — MAGNESIUM: MAGNESIUM: 1.8 mg/dL (ref 1.6–2.6)

## 2021-09-09 LAB — PHOSPHORUS: PHOSPHORUS: 3 mg/dL (ref 2.4–5.1)

## 2021-09-09 MED ADMIN — pantoprazole (PROTONIX) injection 40 mg: 40 mg | INTRAVENOUS | @ 13:00:00

## 2021-09-09 MED ADMIN — iohexol (OMNIPAQUE) 300 mg iodine/mL oral solution 40 mL: 40 mL | ORAL | @ 04:00:00

## 2021-09-09 MED ADMIN — MORPhine 4 mg/mL injection 4 mg: 4 mg | INTRAVENOUS | @ 01:00:00 | Stop: 2021-09-19

## 2021-09-09 MED ADMIN — sodium chloride (NS) 0.9 % flush 10 mL: 10 mL | INTRAVENOUS | @ 11:00:00

## 2021-09-09 MED ADMIN — MORPhine 20 mg/mL concentrated solution 5 mg: 5 mg | ORAL | @ 13:00:00 | Stop: 2021-09-09

## 2021-09-09 MED ADMIN — OLANZapine (ZyPREXA) tablet 5 mg: 5 mg | ORAL | @ 04:00:00

## 2021-09-09 MED ADMIN — insulin NPH (HumuLIN,NovoLIN) injection 14 Units: 14 [IU] | SUBCUTANEOUS | @ 05:00:00 | Stop: 2021-09-09

## 2021-09-09 MED ADMIN — MORPhine 4 mg/mL injection 4 mg: 4 mg | INTRAVENOUS | @ 08:00:00 | Stop: 2021-09-09

## 2021-09-09 MED ADMIN — fat emulsion 20 % with fish oil (SMOFLIPID) infusion 250 mL: 250 mL | INTRAVENOUS | @ 05:00:00 | Stop: 2021-09-09

## 2021-09-09 MED ADMIN — insulin regular (HumuLIN,NovoLIN) injection 0-20 Units: 0-20 [IU] | SUBCUTANEOUS | @ 18:00:00

## 2021-09-09 MED ADMIN — octreotide (SandoSTATIN) 100 mcg in sodium chloride (NS) 0.9 % 100 mL IVPB: 100 ug | INTRAVENOUS | @ 21:00:00 | Stop: 2021-09-09

## 2021-09-09 MED ADMIN — MORPhine 4 mg/mL injection 4 mg: 4 mg | INTRAVENOUS | @ 14:00:00 | Stop: 2021-09-09

## 2021-09-09 MED ADMIN — MORPhine 4 mg/mL injection 4 mg: 4 mg | INTRAVENOUS | @ 20:00:00 | Stop: 2021-09-19

## 2021-09-09 MED ADMIN — iohexoL (OMNIPAQUE) 350 mg iodine/mL solution 100 mL: 100 mL | INTRAVENOUS | @ 07:00:00 | Stop: 2021-09-09

## 2021-09-09 MED ADMIN — piperacillin-tazobactam (ZOSYN) 3.375 g in sodium chloride 0.9 % (NS) 100 mL IVPB-connector bag: 3.375 g | INTRAVENOUS | @ 19:00:00 | Stop: 2021-09-15

## 2021-09-09 MED ADMIN — enoxaparin (LOVENOX) syringe 40 mg: 40 mg | SUBCUTANEOUS | @ 13:00:00 | Stop: 2021-09-09

## 2021-09-09 MED ADMIN — Adult Parenteral Nutrition: INTRAVENOUS | @ 05:00:00 | Stop: 2021-09-09

## 2021-09-09 MED ADMIN — sodium chloride (NS) 0.9 % flush 10 mL: 10 mL | INTRAVENOUS | @ 15:00:00

## 2021-09-09 MED ADMIN — fentaNYL (DURAGESIC) 50 mcg/hr 1 patch: 1 | TRANSDERMAL | @ 19:00:00 | Stop: 2021-09-15

## 2021-09-09 MED ADMIN — dexamethasone (DECADRON) 4 mg/mL injection 4 mg: 4 mg | INTRAVENOUS | @ 12:00:00 | Stop: 2021-09-09

## 2021-09-09 MED ADMIN — insulin regular (HumuLIN,NovoLIN) injection 0-20 Units: 0-20 [IU] | SUBCUTANEOUS | @ 12:00:00

## 2021-09-09 MED ADMIN — piperacillin-tazobactam (ZOSYN) 3.375 g in sodium chloride 0.9 % (NS) 100 mL IVPB-connector bag: 3.375 g | INTRAVENOUS | @ 12:00:00 | Stop: 2021-09-15

## 2021-09-09 MED ADMIN — lidocaine (LIDODERM) 5 % patch 1 patch: 1 | TRANSDERMAL | @ 13:00:00

## 2021-09-09 MED ADMIN — OLANZapine (ZyPREXA) tablet 2.5 mg: 2.5 mg | ORAL | @ 13:00:00

## 2021-09-09 NOTE — Unmapped (Signed)
[facility]  DIVISION OF INTERVENTIONAL RADIOLOGY CONSULTATION       IR Consultation Note     Requesting Attending Physician: Dossie Der,*  Service Requesting Consult: Gynecology (GYN)    Date of Service: 09/09/2021  Consulting Interventional Radiologist: Dr. Orlando Penner      HPI:  46 y.o. female with Ovarian clear cell carcinoma  seen in consultation at the request of inpatient team for evaluation for  Subcutaneous collection drainage placement.     PAST MEDICAL HISTORY:  No past medical history on file.    PAST SURGICAL HISTORY:  Past Surgical History:   Procedure Laterality Date   ??? IR INSERT G-TUBE PERCUTANEOUS  09/06/2021    IR INSERT G-TUBE PERCUTANEOUS 09/06/2021 Gwenlyn Fudge, MD IMG VIR [facility]       SOCIAL HISTORY:  Social History     Socioeconomic History   ??? Marital status: Married   Tobacco Use   ??? Smoking status: Never   ??? Smokeless tobacco: Never       MEDICATIONS:     Current Facility-Administered Medications:   ???  acetaminophen (TYLENOL) tablet 650 mg, 650 mg, Oral, Q6H PRN, Stergios Moschos, MD  ???  Adult Parenteral Nutrition, , Intravenous, Continuous, Last Rate: 94 mL/hr at 09/09/21 0005, New Bag at 09/09/21 0005 **AND** fat emulsion 20 % with fish oil (SMOFLIPID) infusion 250 mL, 250 mL, Intravenous, Continuous, Wende Bushy, MD, Last Rate: 20.8 mL/hr at 09/09/21 0005, 250 mL at 09/09/21 0005  ???  [START ON 09/10/2021] Adult Parenteral Nutrition, , Intravenous, Continuous **AND** [START ON 09/10/2021] fat emulsion 20 % with fish oil (SMOFLIPID) infusion 250 mL, 250 mL, Intravenous, Continuous, Everette Rank, MD  ???  alteplase (ACTIVASE) injection small catheter clearance 1 mg, 1 mg, Intravenous, Once **AND** [EXPIRED] alteplase (ACTIVASE) injection small catheter clearance 2 mg, 2 mg, Intravenous, Once PRN, Simonne Martinet, MD  ???  bisacodyL (DULCOLAX) suppository 10 mg, 10 mg, Rectal, Daily PRN, Alfonzo Beers, MD  ???  calcium carbonate (TUMS) chewable tablet 200 mg of elem calcium, 200 mg of elem calcium, Oral, TID PRN, Maryan Char, MD, 200 mg of elem calcium at 08/26/21 1110  ???  dexamethasone (DECADRON) 4 mg/mL injection 4 mg, 4 mg, Intravenous, Q24H, Rana Leigh Aurora, MD, 4 mg at 09/09/21 1610  ???  dextrose (D10W) 10% bolus 125 mL, 12.5 g, Intravenous, Q10 Min PRN, Everette Rank, MD  ???  emollient combination no.92 (LUBRIDERM) lotion 1 application, 1 application, Topical, Continuous PRN, Magda Paganini, MD  ???  enoxaparin (LOVENOX) syringe 40 mg, 40 mg, Subcutaneous, Daily, Everette Rank, MD, 40 mg at 09/09/21 0801  ???  fentaNYL (DURAGESIC) 50 mcg/hr 1 patch, 1 patch, Transdermal, Q72H, Benito Mccreedy, MD, 1 patch at 09/06/21 1647  ???  glucagon injection 1 mg, 1 mg, Intramuscular, Once PRN, Everette Rank, MD  ???  glucose chewable tablet 16 g, 16 g, Oral, Q10 Min PRN, Everette Rank, MD  ???  influenza vaccine quad (FLUARIX, FLULAVAL, FLUZONE) (6 MOS & UP) 2022-23, 0.5 mL, Intramuscular, During hospitalization, Gerilyn Nestle, MD  ???  insulin NPH (HumuLIN,NovoLIN) injection 14 Units, 14 Units, Subcutaneous, Q24H, Wende Bushy, MD, 14 Units at 09/09/21 0004  ???  insulin regular (HumuLIN,NovoLIN) injection 0-20 Units, 0-20 Units, Subcutaneous, Q6H SCH, Everette Rank, MD, 8 Units at 09/09/21 [phone]  ???  lidocaine (LIDODERM) 5 % patch 1 patch, 1 patch, Transdermal, Daily, Wende Bushy, MD,  1 patch at 09/09/21 0802  ???  magic mouthwash oral suspension 10 mL, 10 mL, Oral, Q2H PRN, Magda Paganini, MD  ???  metoclopramide (REGLAN) injection 5 mg, 5 mg, Intravenous, BID PRN, Alfonzo Beers, MD  ???  metoclopramide (REGLAN) injection 5 mg, 5 mg, Intravenous, Daily PRN, Wende Bushy, MD  ???  MORPhine 20 mg/mL concentrated solution 5 mg, 5 mg, Oral, Q4H PRN, Everette Rank, MD  ???  MORPhine 4 mg/mL injection 4 mg, 4 mg, Intravenous, Q4H PRN, Everette Rank, MD  ???  naloxone (NARCAN) injection 0.4 mg, 0.4 mg, Intravenous, Q5 Min PRN, Magda Paganini, MD  ???  OLANZapine (ZyPREXA) tablet 2.5 mg, 2.5 mg, Oral, Daily PRN, Burgess Estelle, MD, 2.5 mg at 08/26/21 1539  ???  OLANZapine (ZyPREXA) tablet 2.5 mg, 2.5 mg, Oral, Daily, Burgess Estelle, MD, 2.5 mg at 09/09/21 0801  ???  OLANZapine (ZyPREXA) tablet 5 mg, 5 mg, Oral, Nightly, Christena Flake, MD, 5 mg at 09/08/21 2239  ???  pantoprazole (PROTONIX) injection 40 mg, 40 mg, Intravenous, Daily, Lance Muss, MD, 40 mg at 09/09/21 0801  ???  piperacillin-tazobactam (ZOSYN) 3.375 g in sodium chloride 0.9 % (NS) 100 mL IVPB-connector bag, 3.375 g, Intravenous, Q6H, Ronnette Hila, MD, Stopped at 09/09/21 [phone]  ???  polyethylene glycol (MIRALAX) packet 17 g, 17 g, Oral, Daily PRN, Simonne Martinet, MD  ???  scopolamine (TRANSDERM-SCOP) 1 mg over 3 days topical patch 1 mg, 1 patch, Topical, Q72H, Magda Paganini, MD, 1 mg at 09/08/21 1535  ???  senna (SENOKOT) tablet 2 tablet, 2 tablet, Oral, BID PRN, Alfonzo Beers, MD  ???  simethicone (MYLICON) chewable tablet 80 mg, 80 mg, Oral, Q6H PRN, Maryan Char, MD, 80 mg at 08/26/21 1733  ???  sodium chloride (NS) 0.9 % flush 10 mL, 10 mL, Intravenous, Q8H, Jason Fila Collichio, MD, 10 mL at 09/09/21 0945  ???  sodium chloride (NS) 0.9 % flush 10 mL, 10 mL, Intravenous, Q8H, Gerilyn Nestle, MD, 10 mL at 09/09/21 0944  ???  sodium chloride (NS) 0.9 % flush 10 mL, 10 mL, Intravenous, Q8H, Gerilyn Nestle, MD, 10 mL at 09/08/21 1849  ???  tiZANidine (ZANAFLEX) tablet 2 mg, 2 mg, Oral, Q6H PRN, Maryan Char, MD, 2 mg at 08/24/21 2135    ALLERGIES:  Allergies   Allergen Reactions   ??? Carboplatin Shortness Of Breath   ??? Ketamine Other (See Comments)     Rigors, nausea       REVIEW OF SYSTEMS:  Review of Systems - History obtained from chart review      PHYSICAL EXAM:  Vitals:    09/09/21 0911   BP: 108/79   Pulse: 100   Resp: 18   Temp: 37 ??C (98.6 ??F)   SpO2: 98%       ASA Grade: ASA 3 - Patient with moderate systemic disease with functional limitations    General appearance: fatigued    Airway assessment: Class 3 - Can visualize soft palate     Pertinent Labs:     WBC   Date Value Ref Range Status   09/09/2021 9.0 3.6 - 11.2 10*9/L Final     HGB   Date Value Ref Range Status   09/09/2021 10.7 (L) 11.3 - 14.9 g/dL Final     HCT   Date Value Ref Range Status   09/09/2021 32.7 (L) 34.0 - 44.0 % Final     Platelet  Date Value Ref Range Status   09/09/2021 223 150 - 450 10*9/L Final     INR   Date Value Ref Range Status   09/06/2021 1.06  Final     Creatinine   Date Value Ref Range Status   09/09/2021 0.38 (L) 0.60 - 0.80 mg/dL Final       Imaging: CT abdomen Pelvis reviewed with Dr. Orlando Penner.           Anticoaguation: Yes  If yes, type of anticoagulation Lovenox     ASSESSMENT & PLAN:    46 y.o.female with Ovarian Cancer and recurrent soft tissue collections that will undergo Drain Placement with moderate sedation with VIR.         -Anticipated procedure date:  2/7  -NPO the night prior to procedure  -Ensure recent CBC, chem, pt-inr  -Please hold Lovenox for 1 day prior to procedure.       A total of 10 minutes was spent face-to-face with the patient and on the patient's floor during this encounter and over half of that time was spent on counseling and coordination of care.     Informed Consent:  This procedure and sedation has been fully reviewed with the patient/patient???s authorized representative. The risks, benefits and alternatives have been explained, and the patient/patient???s authorized representative has consented to the procedure.  --The patient will accept blood products in an emergent situation.  --The patient does not have a Do Not Resuscitate order in effect.    The patient was discussed with Dr. Orlando Penner.     Thank you for involving [place] in the care of this patient. Please page the VIR consult pager [phone]) with further questions, concerns, or if new issues arise.  Reva Bores, MD, September 09, 2021, 10:23 AM

## 2021-09-09 NOTE — Unmapped (Signed)
WTSP this afternoon.     Pt reports she is doing considerably better this afternoon compared to this morning. Her pain has improved with the morphine. She denies any diarrhea or nausea. She reports that VIR came to see her this morning and consent her. She is happy to hear that the she will likely get her SubQ fluid collection drained tomorrow, she thinks it will also help with her pain. She was also seen be [facility] and they did not recommend placing an ostomy pouch on her ECF.     Pt has questions regarding the timing of her chemotherapy and if the change in her discharge date will delay her first infusion.     Pt continues to have elevated blood glucoses, today she has required 18 units of regular insulin per her sliding scale.     - Plan for VIR procedure tomorrow  - Plan to increase NPH to 20 units tonight  - Will likely defer beginning chemo pending her clinical course this week, will discuss with Dr. Alvester Morin further.     Everette Rank, MD  PGY1 OBGYN

## 2021-09-09 NOTE — Unmapped (Signed)
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 Pricilla Holm  - s/p XL with radical tumor debulking including total hysterectomy, bilateral salpingo-oophorectomy, infra-gastric omentectomy, and washings on 07/21/2019  - s/p 6 cycles carbo/taxol, completed 12/05/19  -CT 2/022 concerning for progression, started on carbo/gem 2/22-5/24/22.  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 No definitive succus seen intra-op. Treated conservatively with bowel rest and TPN for 1 month then had several I&D of recollected abscess at site of EC fistula with drain placed and left until clinical and imaging evidence of resolution    - Last imaging: CTAP - 08/01/21 with SBO, stable trace ascites, and recurrent anterior abdominal wall fistula vs. Abscess.   -12/29-08/07/21 admission for partial SBO and abdominal wall fluid collection, biopsy of fluid collection consistent at first for upper GI or pancreatobiliary primary   - Last CA-125: 120 on 08/02/21.   - Per GOC discussion w/ Palliative on 1/30, pt wants to continue cancer-directed therapy      SBO   -NGT deferred after TEG placement   -Palliative on board, continue bowel regiment of senna, miralax, and dulcolax  -VIR consulted > TEG placement 2/3  - CTAP on 1/29 to assess decompression per VIR: Overall, no significant change in appearance of complex, possibly closed loop, small bowel obstruction, multiple new locules of fluid and gas in the subcutaneous soft tissues, which may be related to enteric gas from enterocutaneous fistula versus superimposed associated abscesses; There appears to be irregular wall thickening of the medial gastric wall, GI consultation for possible endoscopy to exclude underlying gastric mass (primary or metastasis) is suggested.  -Surg oncology have signed off, defer G-tube to VIR as opposed to surgical management  - Nutrition consulted, TPN started (1/28- )  - The patient had several bouts of diarrhea and was tachycardic 2/5    Abdominal wound/?Abscess at biopsy site   -brown drainage noted at bedside, no acute s/s infection   -wound dressing changed every other day, WOCN consulted for placement of ostomy bag  -was on empiric abx on admission which were dc'd on 1/16 after negative blood cultures   - CTAP 2/5 reveals increased soft tissue densities and locules of fluid and gas in the anterior abdominal wall subcutaneous soft tissues with associated increased adjacent inflammatory stranding and skin thickening. Findings may be related to superimposed infection in addition to patient's known enterocutaneous fistula.  - Rapid WBC uptrend to 13.4 on 2/5 > Pending this AM  - Start on Zosyn today  - Plan to consult VIR for possible drain placement     N/V  - Pall cs: Comp/Reglan, IV Dex, scop patch, Dex BID  - Dex Taper schedule as follows: 4 mg daily x1 week (1/30-), 2 mg daily x1 week, 1 mg daily x1 week.      Pain   -Palliative following, pain well managed with fentanyl patch, 20mg /ml roxanol q4 hours PRN, Naloxone PRN      Poor Nutrition   - Nutrition/Palliative following, on olanzapine   -  TPN running (1/27-), cycling at night > advances to [facility]D  - Start POC glucose checks q6h w/ SSI (1/30-) and 4 units NPH at time of TPN (2/1) > NPH increased to 12 units (2/4)     Anxiety   -Palliative on board, rec considering hydroxyzine     Anemia  -likely chronic disease, no acute concern for bleeding  -s/p 1u PRBC on 1/28 to optimize prior to chemotherapy, Hgb 7.3 > 9.1    Thyroid Function  - TSH .237, Free T4 .54  - Consistent with Sick Euthyroid Syndrome      Prophylaxis: Lovenox, will be held in preparation for procedure tomorrow.      Code Status: full code, confirmed on admission.      Disposition: floor, PT/OT consulted-->HH     Plan discussed with Dr. Alvester Morin and Dr. Ginnie Smart.     Subjective:  Denies fever and chills, endorses cramping pain and primary pain from abdominal mass. She reports her diarrhea has resolved.     Objective:  Temp:  [36.4 ??C (97.5 ??F)-37.5 ??C (99.5 ??F)] 36.8 ??C (98.2 ??F)  Heart Rate:  [101-127] 118  Resp:  [18] 18  BP: (108-127)/(79-98) 120/86  SpO2:  [97 %-100 %] 100 %    Gen: NAD, awake, alert, oriented  CV: regular rate and rhythm   Pulm: clear to auscultation bilaterally, normal work of breathing  Abd:   Non-tender, non-distended. Hyperactive bowel sounds in all 4 quadrants. No rebound or guarding. Abdominal wound with dressing, clean and dry.   GU: deferred    Medications (scheduled)   ??? alteplase  1 mg Intravenous Once   ??? dexamethasone  4 mg Intravenous Q24H   ??? enoxaparin (LOVENOX) injection  40 mg Subcutaneous Daily   ??? fentaNYL  1 patch Transdermal Q72H   ??? flu vacc qs2022-23 6mos up(PF)  0.5 mL Intramuscular During hospitalization   ??? insulin NPH  14 Units Subcutaneous Q24H   ??? insulin regular  0-20 Units Subcutaneous Q6H SCH   ??? lidocaine  1 patch Transdermal Daily   ??? OLANZapine  2.5 mg Oral Daily   ??? OLANZapine  5 mg Oral Nightly   ??? pantoprazole (PROTONIX) intravenous solutio  40 mg Intravenous Daily   ??? scopolamine  1 patch Topical Q72H   ??? sodium chloride  10 mL Intravenous Q8H   ??? sodium chloride  10 mL Intravenous Q8H   ??? sodium chloride  10 mL Intravenous Q8H       Medications (prn)  acetaminophen, bisacodyL, calcium carbonate, dextrose in water, emollient combination no.92, glucagon, glucose, magic mouthwash oral, metoclopramide, metoclopramide, MORPhine, MORPhine injection, naloxone, OLANZapine, polyethylene glycol, senna, simethicone, tiZANidine    Labs: Lab Results   Component Value Date    WBC 13.4 (H) 09/08/2021    HGB 11.1 (L) 09/08/2021    HCT 34.6 09/08/2021    PLT 224 09/08/2021       Lab Results   Component Value Date    NA 139 09/08/2021    K 3.5 09/08/2021    CL 106 09/08/2021    CO2 22.0 09/08/2021    BUN 22 09/08/2021    CREATININE 0.36 (L) 09/08/2021    GLU 286 (H) 09/08/2021    CALCIUM 8.3 (L) 09/08/2021    MG 1.8 09/08/2021    PHOS 3.5 09/08/2021       Lab Results   Component Value Date    BILITOT 0.3 09/08/2021    BILIDIR 0.10 09/08/2021    PROT 5.9  09/08/2021    ALBUMIN 3.0 (L) 09/08/2021    ALT 53 (H) 09/08/2021    AST 31 09/08/2021    ALKPHOS 84 09/08/2021       Lab Results   Component Value Date    INR 1.06 09/06/2021    APTT 24.3 (L) 09/06/2021     Scribe's Attestation: Everette Rank , MD obtained and performed the history, physical exam and medical decision making elements that were  entered into the chart. Documentation assistance was provided by me personally, a scribe. Signed by Tinnie Gens Scribe, on September 09, 2021 at 5:14 AM.         ----------------------------------------------------------------------------------------------------------------------  September 09, 2021 7:32 AM. Documentation assistance provided by the Scribe. I was present during the time the encounter was recorded. The information recorded by the Scribe was done at my direction and has been reviewed and validated by me.  ----------------------------------------------------------------------------------------------------------------------

## 2021-09-09 NOTE — Unmapped (Signed)
Palliative Care Progress Note    Consultation from Requesting Attending Physician:  Dossie Der,*  Primary Care Provider:  Carver Fila, MD  Code Status: Full      Assessment/Recommendation:   This 46 y.o. patient is seriously ill due to SBOs, complicated by co-morbid acute and chronic conditions including PMHx of clear-cell ovarian cancer??(diagnosed 07/2019),??multiple SBOs and abdominal surgeries c/b entero-cutaneous fistula and abscess??that presented to??[facility]??with??several days of severe nausea vomiting, abdominal pain and cramping. ??Found to have malignant SBO. We are following for symptom management     Symptom Assessment and Recommendations:    #Pain due to partial SBO and cancer related abdominal process:   Worsened in lat 24 hours  - S/p TEG placement on 09/06/21  - Continue fentanyl patch at 50 mcg  - Continue 5 mg of highly concentrated 20 mg/ml roxanol Q 4 hrs PRN moderate/severe pain-- IV morphien 4 mg q4h prn if unable to take PO or pain unrelieved by oxycodone in 1 hour  - Keep naloxone PRN for respiratory depression  ??  #Nausea/Vomiting due to SBO: intermittent nausea, well controlled at this time.  - Continue decadron 2 mg daily x1 week followed by 1 mg daily x1 week.   - Continue PPI daily   - Continue compazine 5 mg Q8 hrs PO PRN N/V  - Continue scopolamine patch q 3days- need to watch for sedation  - Continue 5 mg PO olanzapine QHS and olanzapine 2.5 mg in the AM and 2.5 mg once daily PRN  - okay to continue octreotide though likely would not be able to do this outpatient   - Continue senna 4 tabs daily (home regimen)  - Cont Miralax 17 gm daily  - daily dulcolax supp 10 mg    # Insomnia/Poor Appetite  - Continue olanzapine    #Anxiety   - continue hydroxyzine 25 mg q 6 hours PRN acute exacerbation    Goals of Care and Decision Making:     Decisional capacity at time of visit:  Full    Healthcare Decision Maker if lacks capacity: Name:  husband Molly Maduro, confirmed    Current Goals of care: Cancer-directed therapy in hopes to live as long as possible. Hoping for [place] and cytoxin outpatient   ??  Counseling and Coordination - Practical, Emotional, Spiritual Support Needs:    Palliative care visit today included focused interview, active listening, therapeutic use of silence, offering support, sharing empathy and summarization of today's discussion. Doing better with nursing.  -outpatient palliative care referral sent since she is being seen at [facility] for ongoing oncology care    Communicated directly with gyn onc team    Palliative care plans to follow up on Monday 09/10/21    Thank you for this consult. Please Epic Secure Chat me (M-F [phone]) or page Palliative Care [phone]) on weekends/after hours if there are any questions.     Wanita Chamberlain, MD  [facility] & Palliative Care Attending    Subjective:  NEW EVENTS:  Worsening pain in last 24 hours with increased EC fistula output. WBC elevated and scans showing potential pockets of abdominal wall infection    Review of Systems:  A 12 system review of systems was negative except as noted in HPI.    Objective:     Temp:  [36.4 ??C (97.5 ??F)-37.1 ??C (98.8 ??F)] 36.9 ??C (98.4 ??F)  Heart Rate:  [100-118] 114  Resp:  [18] 18  BP: (108-127)/(78-98) 111/78  SpO2:  [98 %-100 %] 100 %  I/O this shift:  In: 1369 [I.V.:20]  Out: 1450 [Urine:1400; Drains:50]    Physical Exam:  Alert, directed, calm, NAD    Test Results:  Lab Results   Component Value Date    WBC 9.0 09/09/2021    RBC 3.96 09/09/2021    HGB 10.7 (L) 09/09/2021    HCT 32.7 (L) 09/09/2021    MCV 82.7 09/09/2021    MCH 27.1 09/09/2021    MCHC 32.8 09/09/2021    RDW 16.5 (H) 09/09/2021    PLT 223 09/09/2021    MPV 8.6 09/09/2021     Lab Results   Component Value Date    NA 136 09/09/2021    K 3.8 09/09/2021    CL 103 09/09/2021    CO2 25.0 09/09/2021    BUN 20 09/09/2021    CREATININE 0.38 (L) 09/09/2021    GLU 307 (H) 09/09/2021    CALCIUM 8.0 (L) 09/09/2021    ALBUMIN 3.0 (L) 09/08/2021    PHOS 3.0 09/09/2021      Lab Results   Component Value Date    ALKPHOS 84 09/08/2021    BILITOT 0.3 09/08/2021    BILIDIR 0.10 09/08/2021    PROT 5.9 09/08/2021    ALBUMIN 3.0 (L) 09/08/2021    ALT 53 (H) 09/08/2021    AST 31 09/08/2021         I personally spent 50 minutes face-to-face and non-face-to-face in the care of this patient, which includes all pre, intra, and post visit time on the date of service.  All documented time was specific to the E/M visit and does not include any procedures that may have been performed.

## 2021-09-09 NOTE — Unmapped (Signed)
Plan of care reviewed with patient. Patient was tachycardic with abdominal distention and yellow/brown foul smelling drainage from abdominal wound. MD Frame was notified. Lidocaine patched applied and 1L Bolus given. Post-Bolus vital signs have been WDL. Lethargy and pain reported. No BM. NPO status. Wound changed. TEG bandage intact. Hyperglycemia at noon; sliding scale given, MD Frame notified. PICC line patent. Abdominal CT with contrast ordered. UOP adequate. TPN stopped as scheduled. Q4 vitals initiated. New VS ordered implemented. Scopalimine patch applied. Fentanyl patch in place.   Rapid consult completed.  Problem: Adult Inpatient Plan of Care  Goal: Plan of Care Review  Outcome: Progressing  Goal: Patient-Specific Goal (Individualized)  Outcome: Progressing  Goal: Absence of Hospital-Acquired Illness or Injury  Outcome: Progressing  Intervention: Identify and Manage Fall Risk  Recent Flowsheet Documentation  Taken 09/08/2021 0702 by Little Ishikawa, RN  Safety Interventions: fall reduction program maintained  Intervention: Prevent Infection  Recent Flowsheet Documentation  Taken 09/08/2021 0702 by Little Ishikawa, RN  Infection Prevention: hand hygiene promoted  Goal: Optimal Comfort and Wellbeing  Outcome: Progressing  Goal: Readiness for Transition of Care  Outcome: Progressing  Goal: Rounds/Family Conference  Outcome: Progressing     Problem: Fall Injury Risk  Goal: Absence of Fall and Fall-Related Injury  Outcome: Progressing  Intervention: Promote Injury-Free Environment  Recent Flowsheet Documentation  Taken 09/08/2021 0702 by Little Ishikawa, RN  Safety Interventions: fall reduction program maintained     Problem: Pain Acute  Goal: Acceptable Pain Control and Functional Ability  Outcome: Progressing     Problem: Pain Chronic (Persistent) (Comorbidity Management)  Goal: Acceptable Pain Control and Functional Ability  Outcome: Progressing

## 2021-09-09 NOTE — Unmapped (Signed)
VSS. No falls during shift. Pain well controlled with PRN medications. CT of abd. Completed. Complaint of nausea while ingesting contrast. TPN infusing per order. Pt complaint of being very tired MD aware. No BM during shift. Voiding without difficulty. Discussed plan of care, all questions answered. Report given to oncoming RN.      Problem: Adult Inpatient Plan of Care  Goal: Plan of Care Review  Outcome: Progressing  Goal: Patient-Specific Goal (Individualized)  Outcome: Progressing  Goal: Absence of Hospital-Acquired Illness or Injury  Outcome: Progressing  Intervention: Identify and Manage Fall Risk  Recent Flowsheet Documentation  Taken 09/09/2021 0500 by Virgil Benedict, RN  Safety Interventions:   fall reduction program maintained   lighting adjusted for tasks/safety   low bed   nonskid shoes/slippers when out of bed  Taken 09/09/2021 0300 by Virgil Benedict, RN  Safety Interventions:   fall reduction program maintained   lighting adjusted for tasks/safety   low bed   nonskid shoes/slippers when out of bed  Taken 09/09/2021 0100 by Virgil Benedict, RN  Safety Interventions:   fall reduction program maintained   nonskid shoes/slippers when out of bed   lighting adjusted for tasks/safety   low bed  Taken 09/08/2021 2300 by Virgil Benedict, RN  Safety Interventions:   fall reduction program maintained   low bed   lighting adjusted for tasks/safety   nonskid shoes/slippers when out of bed  Taken 09/08/2021 2100 by Virgil Benedict, RN  Safety Interventions:   fall reduction program maintained   lighting adjusted for tasks/safety   low bed   nonskid shoes/slippers when out of bed  Taken 09/08/2021 1902 by Virgil Benedict, RN  Safety Interventions:   fall reduction program maintained   lighting adjusted for tasks/safety   low bed   nonskid shoes/slippers when out of bed  Intervention: Prevent Skin Injury  Recent Flowsheet Documentation  Taken 09/09/2021 0500 by Virgil Benedict, RN  Skin Protection: adhesive use limited  Taken 09/09/2021 0300 by Virgil Benedict, RN  Skin Protection: adhesive use limited  Taken 09/09/2021 0100 by Virgil Benedict, RN  Skin Protection: adhesive use limited  Taken 09/08/2021 2300 by Virgil Benedict, RN  Skin Protection: adhesive use limited  Taken 09/08/2021 2100 by Virgil Benedict, RN  Skin Protection: adhesive use limited  Taken 09/08/2021 1902 by Virgil Benedict, RN  Skin Protection: adhesive use limited  Intervention: Prevent and Manage VTE (Venous Thromboembolism) Risk  Recent Flowsheet Documentation  Taken 09/08/2021 1902 by Virgil Benedict, RN  Activity Management: activity adjusted per tolerance  Intervention: Prevent Infection  Recent Flowsheet Documentation  Taken 09/09/2021 0500 by Virgil Benedict, RN  Infection Prevention:   hand hygiene promoted   rest/sleep promoted  Taken 09/09/2021 0300 by Virgil Benedict, RN  Infection Prevention:   hand hygiene promoted   rest/sleep promoted  Taken 09/09/2021 0100 by Virgil Benedict, RN  Infection Prevention:   hand hygiene promoted   rest/sleep promoted  Taken 09/08/2021 2300 by Virgil Benedict, RN  Infection Prevention:   hand hygiene promoted   rest/sleep promoted  Taken 09/08/2021 2100 by Virgil Benedict, RN  Infection Prevention:   hand hygiene promoted   rest/sleep promoted  Taken 09/08/2021 1902 by Virgil Benedict, RN  Infection Prevention:   hand hygiene promoted   rest/sleep promoted  Goal: Optimal Comfort and Wellbeing  Outcome: Progressing  Goal: Readiness for Transition of Care  Outcome: Progressing  Goal: Rounds/Family Conference  Outcome: Progressing     Problem: Fall Injury Risk  Goal: Absence of Fall and Fall-Related Injury  Outcome: Progressing  Intervention: Promote Scientist, clinical (histocompatibility and immunogenetics) Documentation  Taken 09/09/2021 0500 by Virgil Benedict, RN  Safety Interventions:   fall reduction program maintained   lighting adjusted for tasks/safety   low bed   nonskid shoes/slippers when out of bed  Taken 09/09/2021 0300 by Virgil Benedict, RN  Safety Interventions:   fall reduction program maintained   lighting adjusted for tasks/safety   low bed   nonskid shoes/slippers when out of bed  Taken 09/09/2021 0100 by Virgil Benedict, RN  Safety Interventions:   fall reduction program maintained   nonskid shoes/slippers when out of bed   lighting adjusted for tasks/safety   low bed  Taken 09/08/2021 2300 by Virgil Benedict, RN  Safety Interventions:   fall reduction program maintained   low bed   lighting adjusted for tasks/safety   nonskid shoes/slippers when out of bed  Taken 09/08/2021 2100 by Virgil Benedict, RN  Safety Interventions:   fall reduction program maintained   lighting adjusted for tasks/safety   low bed   nonskid shoes/slippers when out of bed  Taken 09/08/2021 1902 by Virgil Benedict, RN  Safety Interventions:   fall reduction program maintained   lighting adjusted for tasks/safety   low bed   nonskid shoes/slippers when out of bed     Problem: Pain Acute  Goal: Acceptable Pain Control and Functional Ability  Outcome: Progressing     Problem: Pain Chronic (Persistent) (Comorbidity Management)  Goal: Acceptable Pain Control and Functional Ability  Outcome: Progressing

## 2021-09-09 NOTE — Unmapped (Signed)
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    Per patient, she noticed a pop a few days ago and drainage that looked feculent oozed out.  Patient states she is having normal BMs per rectum.  Upon my assessment, patient has a Mepilex 4x4 foam dressing in placed that was applied yesterday and has small amount of maroon drainage.  No odor noted.  Patient has a small opening to the left of her umbilicus where drainage is seeping out.  Upon mild palpation to periwound, I was able to express and scant amount of maroon drainage.  Periwound contours are uneven due to umbilicus and due to small amount of drainage, do not recommend pouching at this time.  Will manage the draining wound with a fistula dressing.  If the output increases, may consider a pouch.  Patient is pending VIR for subcutaneous drainage collection with drain placement on 09/10/21.        Abdomen 09/09/21      Fistula Type:  - Entercutaneous fistula vs abscess wound Location:  - umbilicus area     Current Diet Order:  -   Active Orders   Diet    NPO Sips with meds; Medically necessary    Nutrition Support:  - TPN (Total Parenteral Nutrition)     Output:  - Thin  - Low output  - maroon    Perifistular/Periwound Skin Condition:  - Intact    Abdominal Contours:  - Uneven    Fistula Management:  - Crusting, zinc oxide barrier cream and absorptive dressings Fistula Dressing    Recommendations/Plan:  - See nursing orders for fistula care instructions.  - Contact WOCN with questions, concerns, or deterioration. Recommended Consults:  - Nutrition     Plan of Care Discussed With:  - Patient  - RN primary  - LIP Dr. Raiford Simmonds    Fistula Supplies:   - In-patient supplies at the bedside     - Supplies available on unit, unit to order Fistula Product List:   Zinc Oxide barrier cream Z Guard [id number]  40M No-Sting Barrier Film- Spray- ([id number])  Medline Remedy Antifungal Powder- ([id number])     WOCN Follow Up:  - We will sign off at this time   Re-consult CWOCN team for increased drainage to umbilical wound/ECF or if difficult to manage and deteriorates     Workup Time:   45 minutes     Clide Deutscher, BSN, RN, CWOCN   [phone]) [phone]-office at [facility]  [phone] at [facility] when available

## 2021-09-10 LAB — BASIC METABOLIC PANEL
ANION GAP: 8 mmol/L (ref 5–14)
BLOOD UREA NITROGEN: 20 mg/dL (ref 9–23)
BUN / CREAT RATIO: 57
CALCIUM: 8.3 mg/dL — ABNORMAL LOW (ref 8.7–10.4)
CHLORIDE: 104 mmol/L (ref 98–107)
CO2: 26 mmol/L (ref 20.0–31.0)
CREATININE: 0.35 mg/dL — ABNORMAL LOW
EGFR CKD-EPI (2021) FEMALE: >90 mL/min/{1.73_m2} (ref >=60)
GLUCOSE RANDOM: 212 mg/dL — ABNORMAL HIGH (ref 70–179)
POTASSIUM: 3.8 mmol/L (ref 3.4–4.8)
SODIUM: 138 mmol/L (ref 135–145)

## 2021-09-10 LAB — APTT
APTT: 23.4 s — ABNORMAL LOW (ref 25.1–36.5)
HEPARIN CORRELATION: <0.2

## 2021-09-10 LAB — CBC
HEMATOCRIT: 30 % — ABNORMAL LOW (ref 34.0–44.0)
HEMOGLOBIN: 9.7 g/dL — ABNORMAL LOW (ref 11.3–14.9)
MEAN CORPUSCULAR HEMOGLOBIN CONC: 32.4 g/dL (ref 32.0–36.0)
MEAN CORPUSCULAR HEMOGLOBIN: 26.8 pg (ref 25.9–32.4)
MEAN CORPUSCULAR VOLUME: 82.8 fL (ref 77.6–95.7)
MEAN PLATELET VOLUME: 8.7 fL (ref 6.8–10.7)
PLATELET COUNT: 208 10*9/L (ref 150–450)
RED BLOOD CELL COUNT: 3.62 10*12/L — ABNORMAL LOW (ref 3.95–5.13)
RED CELL DISTRIBUTION WIDTH: 16.1 % — ABNORMAL HIGH (ref 12.2–15.2)
WBC ADJUSTED: 6.5 10*9/L (ref 3.6–11.2)

## 2021-09-10 LAB — PHOSPHORUS: PHOSPHORUS: 3.5 mg/dL (ref 2.4–5.1)

## 2021-09-10 LAB — PROTIME-INR
INR: 1.16
PROTIME: 13.2 s — ABNORMAL HIGH (ref 9.8–12.8)

## 2021-09-10 LAB — MAGNESIUM: MAGNESIUM: 1.7 mg/dL (ref 1.6–2.6)

## 2021-09-10 MED ADMIN — zinc oxide-cod liver oil (DESITIN 40%) Paste: TOPICAL | @ 18:00:00

## 2021-09-10 MED ADMIN — piperacillin-tazobactam (ZOSYN) 3.375 g in sodium chloride 0.9 % (NS) 100 mL IVPB-connector bag: 3.375 g | INTRAVENOUS | @ 01:00:00 | Stop: 2021-09-15

## 2021-09-10 MED ADMIN — insulin regular (HumuLIN,NovoLIN) injection 0-20 Units: 0-20 [IU] | SUBCUTANEOUS | @ 17:00:00

## 2021-09-10 MED ADMIN — sodium chloride (NS) 0.9 % flush 10 mL: 10 mL | INTRAVENOUS | @ 23:00:00

## 2021-09-10 MED ADMIN — dexamethasone (DECADRON) 4 mg/mL injection 2 mg: 2 mg | INTRAVENOUS | @ 10:00:00 | Stop: 2021-09-17

## 2021-09-10 MED ADMIN — lidocaine (LIDODERM) 5 % patch 1 patch: 1 | TRANSDERMAL | @ 14:00:00

## 2021-09-10 MED ADMIN — fentaNYL (PF) (SUBLIMAZE) injection: INTRAVENOUS | @ 16:00:00 | Stop: 2021-09-10

## 2021-09-10 MED ADMIN — lidocaine (XYLOCAINE) 10 mg/mL (1 %) injection: SUBCUTANEOUS | @ 16:00:00 | Stop: 2021-09-10

## 2021-09-10 MED ADMIN — OLANZapine (ZyPREXA) tablet 5 mg: 5 mg | ORAL | @ 02:00:00

## 2021-09-10 MED ADMIN — midazolam (VERSED) injection: INTRAVENOUS | @ 16:00:00 | Stop: 2021-09-10

## 2021-09-10 MED ADMIN — insulin regular (HumuLIN,NovoLIN) injection 0-20 Units: 0-20 [IU] | SUBCUTANEOUS | @ 12:00:00

## 2021-09-10 MED ADMIN — sodium chloride (NS) 0.9 % infusion: INTRAVENOUS | @ 16:00:00 | Stop: 2021-09-10

## 2021-09-10 MED ADMIN — sodium chloride (NS) 0.9 % flush 10 mL: 10 mL | INTRAVENOUS | @ 11:00:00

## 2021-09-10 MED ADMIN — sodium chloride (NS) 0.9 % flush 10 mL: 10 mL | INTRAVENOUS | @ 14:00:00

## 2021-09-10 MED ADMIN — insulin NPH (HumuLIN,NovoLIN) injection 20 Units: 20 [IU] | SUBCUTANEOUS | @ 05:00:00

## 2021-09-10 MED ADMIN — OLANZapine (ZyPREXA) tablet 2.5 mg: 2.5 mg | ORAL | @ 14:00:00

## 2021-09-10 MED ADMIN — Adult Parenteral Nutrition: INTRAVENOUS | @ 05:00:00 | Stop: 2021-09-10

## 2021-09-10 MED ADMIN — piperacillin-tazobactam (ZOSYN) 3.375 g in sodium chloride 0.9 % (NS) 100 mL IVPB-connector bag: 3.375 g | INTRAVENOUS | @ 10:00:00 | Stop: 2021-09-15

## 2021-09-10 MED ADMIN — octreotide (SandoSTATIN) 100 mcg in sodium chloride (NS) 0.9 % 100 mL IVPB: 100 ug | INTRAVENOUS | @ 02:00:00 | Stop: 2021-09-09

## 2021-09-10 MED ADMIN — scopolamine (TRANSDERM-SCOP) 1 mg over 3 days topical patch 1 mg: 1 | TOPICAL | @ 19:00:00

## 2021-09-10 MED ADMIN — pantoprazole (PROTONIX) injection 40 mg: 40 mg | INTRAVENOUS | @ 14:00:00 | Stop: 2021-09-10

## 2021-09-10 MED ADMIN — lidocaine (LIDODERM) 5 % patch 1 patch: 1 | TRANSDERMAL | @ 19:00:00

## 2021-09-10 MED ADMIN — piperacillin-tazobactam (ZOSYN) 3.375 g in sodium chloride 0.9 % (NS) 100 mL IVPB-connector bag: 3.375 g | INTRAVENOUS | @ 20:00:00 | Stop: 2021-09-15

## 2021-09-10 MED ADMIN — fat emulsion 20 % with fish oil (SMOFLIPID) infusion 250 mL: 250 mL | INTRAVENOUS | @ 05:00:00 | Stop: 2021-09-10

## 2021-09-10 MED ADMIN — piperacillin-tazobactam (ZOSYN) 3.375 g in sodium chloride 0.9 % (NS) 100 mL IVPB-connector bag: 3.375 g | INTRAVENOUS | @ 14:00:00 | Stop: 2021-09-15

## 2021-09-10 NOTE — Unmapped (Signed)
VSS. TPN running today. PICC line cohort utilized for infection control. Patient remains free of falls. Urine output it sufficient and patient had a BM today. Patient has a new bulb drain placed by VIR at the left abdomen. Suction is applied vie compression of bulb. Fistula dressing removed, fistula cleansed and redressed today per orders. Dressing changed at left neck where TEG is.   Problem: Adult Inpatient Plan of Care  Goal: Plan of Care Review  Outcome: Progressing  Goal: Patient-Specific Goal (Individualized)  Outcome: Progressing  Goal: Absence of Hospital-Acquired Illness or Injury  Outcome: Progressing  Intervention: Identify and Manage Fall Risk  Recent Flowsheet Documentation  Taken 09/10/2021 0900 by Trenton Gammon, RN  Safety Interventions:   fall reduction program maintained   low bed   nonskid shoes/slippers when out of bed  Taken 09/10/2021 0710 by Trenton Gammon, RN  Safety Interventions:   low bed   fall reduction program maintained   infection management   nonskid shoes/slippers when out of bed  Intervention: Prevent Infection  Recent Flowsheet Documentation  Taken 09/10/2021 0900 by Trenton Gammon, RN  Infection Prevention:   hand hygiene promoted   rest/sleep promoted   cohorting utilized  Taken 09/10/2021 0710 by Trenton Gammon, RN  Infection Prevention:   hand hygiene promoted   rest/sleep promoted  Goal: Optimal Comfort and Wellbeing  Outcome: Progressing  Goal: Readiness for Transition of Care  Outcome: Progressing  Goal: Rounds/Family Conference  Outcome: Progressing     Problem: Fall Injury Risk  Goal: Absence of Fall and Fall-Related Injury  Outcome: Progressing  Intervention: Promote Injury-Free Environment  Recent Flowsheet Documentation  Taken 09/10/2021 0900 by Trenton Gammon, RN  Safety Interventions:   fall reduction program maintained   low bed   nonskid shoes/slippers when out of bed  Taken 09/10/2021 0710 by Trenton Gammon, RN  Safety Interventions:   low bed   fall reduction program maintained infection management   nonskid shoes/slippers when out of bed     Problem: Pain Acute  Goal: Acceptable Pain Control and Functional Ability  Outcome: Progressing     Problem: Pain Chronic (Persistent) (Comorbidity Management)  Goal: Acceptable Pain Control and Functional Ability  Outcome: Progressing

## 2021-09-10 NOTE — Unmapped (Signed)
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 Pricilla Holm  - s/p XL with radical tumor debulking including total hysterectomy, bilateral salpingo-oophorectomy, infra-gastric omentectomy, and washings on 07/21/2019  - s/p 6 cycles carbo/taxol, completed 12/05/19  -CT 2/022 concerning for progression, started on carbo/gem 2/22-5/24/22.  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 No definitive succus seen intra-op. Treated conservatively with bowel rest and TPN for 1 month then had several I&D of recollected abscess at site of EC fistula with drain placed and left until clinical and imaging evidence of resolution    - Last imaging: CTAP - 08/01/21 with SBO, stable trace ascites, and recurrent anterior abdominal wall fistula vs. Abscess.   -12/29-08/07/21 admission for partial SBO and abdominal wall fluid collection, biopsy of fluid collection consistent at first for upper GI or pancreatobiliary primary   - Last CA-125: 120 on 08/02/21.   - Per GOC discussion w/ Palliative on 1/30, pt wants to continue cancer-directed therapy      SBO   -NGT deferred after TEG placement   -Palliative on board, continue bowel regiment of senna, miralax, and dulcolax  -VIR consulted > TEG placement 2/3  - CTAP on 1/29 to assess decompression per VIR: Overall, no significant change in appearance of complex, possibly closed loop, small bowel obstruction, multiple new locules of fluid and gas in the subcutaneous soft tissues, which may be related to enteric gas from enterocutaneous fistula versus superimposed associated abscesses; There appears to be irregular wall thickening of the medial gastric wall, GI consultation for possible endoscopy to exclude underlying gastric mass (primary or metastasis) is suggested.  -Surg oncology have signed off, defer G-tube to VIR as opposed to surgical management  - Nutrition consulted, TPN started (1/28- )  - The patient had several bouts of diarrhea and was tachycardic 2/5, WBC 13 > CTAP c/f infection    ECF - Hx abscesses - Leukocytosis   -brown drainage noted at bedside, no acute s/s infection   -wound dressing changed every other day > WOCN cs: zinc oxide barrier and dressings, no ostomy pouch   -was on empiric abx on admission which were dc'd on 1/16 after negative blood cultures   - CTAP 2/5 reveals increased soft tissue densities and locules of fluid and gas in the anterior abdominal wall subcutaneous soft tissues with associated increased adjacent inflammatory stranding and skin thickening. Findings may be related to superimposed infection in addition to patient's known enterocutaneous fistula.  - Rapid WBC uptrend to 13.4 on 2/5 > 9.0 (2/6)  - Started on Zosyn (2/6-)  - VIR drain placement with subcutaneous drainage collection today      N/V  - Pall cs: Comp/Reglan, IV Dex, scop patch, Dex BID. Ok to continue octreotide though likely not able to outpatient  - Dex Taper schedule as follows: 4 mg daily x1 week (1/30), 2 mg daily x1 week (2/7), 1 mg daily x1 week (2/14).      Pain   -Palliative following, pain well managed with  fentanyl patch, Tylenol PRN, 20mg /ml roxanol q4 hours PRN, Naloxone PRN, IV morphine PRN, tizanidine     Poor Nutrition   - Nutrition/Palliative following, on olanzapine; increase calories in TPN and add octreotide to TPN   - TPN running (1/27-), cycling at night > advances to CLD  - Start POC glucose checks q6h w/ SSI (1/30-) and 4 units NPH at time of TPN (2/1) > NPH increased to 20 units (2/6) Anxiety   -Palliative on board, rec considering hydroxyzine     Anemia  -likely chronic disease, no acute concern for bleeding  -s/p 1u PRBC on 1/28 to optimize prior to chemotherapy, Hgb 7.3 > 9.1    Thyroid Function  - TSH .237, Free T4 .54  - Consistent with Sick Euthyroid Syndrome      Prophylaxis: Lovenox, held in preparation for procedure.      Code Status: full code, confirmed on admission.      Disposition: floor, PT/OT consulted-->HH     Plan discussed with Dr. Alvester Morin and Dr. Ginnie Smart.     Subjective:  Patient reports that she feels a lot better this AM and feels awake and refreshed. Her pain was not an issue overnight. She denies fevers, chills, nausea, vomiting, diarrhea. She is excited for her VIR procedure today and hopeful she will get to chemo soon.     Objective:  Temp:  [36.9 ??C (98.4 ??F)-37 ??C (98.6 ??F)] 37 ??C (98.6 ??F)  Heart Rate:  [89-114] 89  Resp:  [18] 18  BP: (105-115)/(69-79) 105/69  SpO2:  [98 %-100 %] 99 %    Gen: NAD, awake, alert, oriented  CV: regular rate and rhythm   Pulm: clear to auscultation bilaterally, normal work of breathing  Abd:   Non-tender, non-distended. Diminished bowel sounds in all 4 quadrants. No rebound or guarding. Midline abdominal mass non tender. Abdominal wound with dressing, clean and dry. Lidocaine patches in place.   GU: deferred    Medications (scheduled)   ??? alteplase  1 mg Intravenous Once   ??? [START ON 09/17/2021] dexamethasone  1 mg Intravenous Q24H   ??? dexamethasone  2 mg Intravenous Q24H   ??? fentaNYL  1 patch Transdermal Q72H   ??? flu vacc [id number] 6mos up(PF)  0.5 mL Intramuscular During hospitalization   ??? insulin NPH  20 Units Subcutaneous Q24H   ??? insulin regular  0-20 Units Subcutaneous Q6H SCH   ??? lidocaine  1 patch Transdermal Daily   ??? OLANZapine  2.5 mg Oral Daily   ??? OLANZapine  5 mg Oral Nightly   ??? pantoprazole (PROTONIX) intravenous solutio  40 mg Intravenous Daily   ??? piperacillin-tazobactam (ZOSYN) IV (intermittent)  3.375 g Intravenous Q6H   ??? scopolamine  1 patch Topical Q72H   ??? sodium chloride  10 mL Intravenous Q8H   ??? sodium chloride  10 mL Intravenous Q8H   ??? sodium chloride  10 mL Intravenous Q8H   ??? zinc oxide-cod liver oil   Topical Daily       Medications (prn)  acetaminophen, bisacodyL, calcium carbonate, dextrose in water, emollient combination no.92, glucagon, glucose, magic mouthwash oral, metoclopramide, MORPhine, MORPhine injection, naloxone, OLANZapine, polyethylene glycol, senna, simethicone, tiZANidine    Labs:   Lab Results   Component Value Date    WBC 9.0 09/09/2021    HGB 10.7 (L) 09/09/2021    HCT 32.7 (L) 09/09/2021    PLT 223 09/09/2021       Lab Results   Component Value  Date    NA 136 09/09/2021    K 3.8 09/09/2021    CL 103 09/09/2021    CO2 25.0 09/09/2021    BUN 20 09/09/2021    CREATININE 0.38 (L) 09/09/2021    GLU 307 (H) 09/09/2021    CALCIUM 8.0 (L) 09/09/2021    MG 1.8 09/09/2021    PHOS 3.0 09/09/2021       Lab Results   Component Value Date    BILITOT 0.3 09/08/2021    BILIDIR 0.10 09/08/2021    PROT 5.9 09/08/2021    ALBUMIN 3.0 (L) 09/08/2021    ALT 53 (H) 09/08/2021    AST 31 09/08/2021    ALKPHOS 84 09/08/2021       Lab Results   Component Value Date    INR 1.06 09/06/2021    APTT 24.3 (L) 09/06/2021     Scribe's Attestation: Warden Fillers, MD obtained and performed the history, physical exam and medical decision making elements that were  entered into the chart. Documentation assistance was provided by me personally, a scribe. Signed by Freddie Apley Scribe, on September 10, 2021 at 4:34 AM.         ----------------------------------------------------------------------------------------------------------------------  September 10, 2021 6:10 AM. Documentation assistance provided by the Scribe. I was present during the time the encounter was recorded. The information recorded by the Scribe was done at my direction and has been reviewed and validated by me.  ----------------------------------------------------------------------------------------------------------------------

## 2021-09-10 NOTE — Unmapped (Signed)
For drain placement procedure, Lidocaine patch on abdomen (placed 0928 today, 09/10/21) removed to prep area for drain. Mia, pt's [employer] RN, notified to replace Lidocaine patch when patient returns to floor.

## 2021-09-10 NOTE — Unmapped (Signed)
COP reviewed with patient today. All VSS. Patient has PICC line with TPN running until mid day, cohort utilized for infection control when caring for PICC line. Patient in pain this morning and pain being managed with IV morphine for severe pain. Patients TEG is draining to gravity. Patient receiving IV antibiotics. Patient remains free of falls today. Urine output is sufficient.   Problem: Adult Inpatient Plan of Care  Goal: Plan of Care Review  Outcome: Progressing  Goal: Patient-Specific Goal (Individualized)  Outcome: Progressing  Goal: Absence of Hospital-Acquired Illness or Injury  Outcome: Progressing  Intervention: Identify and Manage Fall Risk  Recent Flowsheet Documentation  Taken 09/09/2021 0705 by Trenton Gammon, RN  Safety Interventions:   low bed   fall reduction program maintained   lighting adjusted for tasks/safety  Intervention: Prevent Infection  Recent Flowsheet Documentation  Taken 09/09/2021 0705 by Trenton Gammon, RN  Infection Prevention:   hand hygiene promoted   rest/sleep promoted  Goal: Optimal Comfort and Wellbeing  Outcome: Progressing  Goal: Readiness for Transition of Care  Outcome: Progressing  Goal: Rounds/Family Conference  Outcome: Progressing     Problem: Fall Injury Risk  Goal: Absence of Fall and Fall-Related Injury  Outcome: Progressing  Intervention: Promote Injury-Free Environment  Recent Flowsheet Documentation  Taken 09/09/2021 0705 by Trenton Gammon, RN  Safety Interventions:   low bed   fall reduction program maintained   lighting adjusted for tasks/safety     Problem: Pain Acute  Goal: Acceptable Pain Control and Functional Ability  Outcome: Progressing     Problem: Pain Chronic (Persistent) (Comorbidity Management)  Goal: Acceptable Pain Control and Functional Ability  Outcome: Progressing

## 2021-09-10 NOTE — Unmapped (Signed)
[place] [facility] - Operative Note     VIR Post-Procedure Note    Procedure Name: Ultrasound-guided drain placement.     Pre-Op Diagnosis: Left anterior wall collection associated with mass.     Post-Op Diagnosis: Same as pre-operative diagnosis    VIR Providers    Operator: Dr. Selena Batten and Dr. Worthy Flank.    Time out: Prior to the procedure, a time out was performed with all team members present. During the time out, the patient, procedure and procedure site when applicable were verbally verified by the team members and Dr. Selena Batten.     Description of procedure: Successful ultrasound-guided drain placement (65F) into the left anterior wall fluid collection thats associated with the central anterior abdominal soft tissue mass (best seen on CT abdomen and pelvis  09/09/2021), with 10 mL of pus aspirated.       Plan:   Specimen sent to microbiology.  Flush 5 mL normal saline BID.     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    Sedation:Moderate sedation    Estimated Blood Loss: approximately <71mL  Complications: None    See detailed procedure note with images in PACS [facility]).    The patient tolerated the procedure well without incident or complication and left the room in stable condition.    Theola Sequin Levern Kalka  09/10/2021 11:13 AM

## 2021-09-10 NOTE — Unmapped (Signed)
Referral to Outpatient Oncology Palliative Care Clinic (OOPC)     09/06/21: NEW pt referral to Outpatient Palliative Care Clinic [facility]) from Dr Nelly Rout and Thornton Papas with [facility] Inpatient Palliative Care team who followed patient during hospitalization.      Reason for referral: continued palliative care     Summary:   46 y.o.??patient??PMHx of clear-cell ovarian cancer??(diagnosed 07/2019),??multiple SBOs and abdominal surgeries c/b entero-cutaneous fistula and abscess??that presented to??[facility]??with??several days of severe nausea vomiting, abdominal pain and cramping. Found to have malignant SBO.       Plan:  Appointment request sent to [facility] scheduler for next available, new patient appointment.        Allegra Lai RN BSN OCN MS  RN Clinical Coordinator  [facility] Outpatient Oncology Palliative Care [facility])  [facility]  Providers: Dr. Redgie Grayer, Dr. Kathlynn Grate, Dr. Mickie Hillier, Dr Wanita Chamberlain, Burtis Junes ANP, Cicero Duck ANP, Hector Shade CPP  Phone: [phone]  Office: [phone]

## 2021-09-10 NOTE — Unmapped (Signed)
[facility]SP for PM rounds.     Pt just returned from VIR and is feeling well. She has questions about transitioning to PO medications prior to discharge.     Abdomen:   JP drain with clean dry and intact dressing draining purulent, sanguinous fluid.    - Plan to transition IV medications to PO  - Plan to transition to PO antibiotics tomorrow

## 2021-09-10 NOTE — Unmapped (Signed)
VSS. No falls or s/s of infection during shift. No complaints of pain during shift, no complaints of N/V. Ambulating in room, tolerating well. Voiding without difficulty. TPN infusing as ordered. Discussed plan of care, all questions answered. Report given to oncoming RN.      Problem: Adult Inpatient Plan of Care  Goal: Plan of Care Review  Outcome: Progressing  Goal: Patient-Specific Goal (Individualized)  Outcome: Progressing  Goal: Absence of Hospital-Acquired Illness or Injury  Outcome: Progressing  Intervention: Identify and Manage Fall Risk  Recent Flowsheet Documentation  Taken 09/10/2021 0500 by Virgil Benedict, RN  Safety Interventions:   fall reduction program maintained   lighting adjusted for tasks/safety   low bed   nonskid shoes/slippers when out of bed  Taken 09/10/2021 0300 by Virgil Benedict, RN  Safety Interventions:   fall reduction program maintained   lighting adjusted for tasks/safety   latex precautions   nonskid shoes/slippers when out of bed  Taken 09/10/2021 0100 by Virgil Benedict, RN  Safety Interventions:   fall reduction program maintained   lighting adjusted for tasks/safety   low bed   nonskid shoes/slippers when out of bed  Taken 09/09/2021 2300 by Virgil Benedict, RN  Safety Interventions:   fall reduction program maintained   lighting adjusted for tasks/safety   low bed   nonskid shoes/slippers when out of bed  Taken 09/09/2021 2100 by Virgil Benedict, RN  Safety Interventions:   fall reduction program maintained   lighting adjusted for tasks/safety   low bed   nonskid shoes/slippers when out of bed  Taken 09/09/2021 1902 by Virgil Benedict, RN  Safety Interventions:   fall reduction program maintained   lighting adjusted for tasks/safety   low bed   nonskid shoes/slippers when out of bed  Intervention: Prevent Skin Injury  Recent Flowsheet Documentation  Taken 09/10/2021 0500 by Virgil Benedict, RN  Skin Protection: adhesive use limited  Taken 09/10/2021 0300 by Virgil Benedict, RN  Skin Protection: adhesive use limited  Taken 09/10/2021 0100 by Virgil Benedict, RN  Skin Protection: adhesive use limited  Taken 09/09/2021 2100 by Virgil Benedict, RN  Skin Protection: adhesive use limited  Taken 09/09/2021 1902 by Virgil Benedict, RN  Skin Protection: adhesive use limited  Intervention: Prevent and Manage VTE (Venous Thromboembolism) Risk  Recent Flowsheet Documentation  Taken 09/09/2021 1902 by Virgil Benedict, RN  Activity Management: activity adjusted per tolerance  Intervention: Prevent Infection  Recent Flowsheet Documentation  Taken 09/10/2021 0500 by Virgil Benedict, RN  Infection Prevention:   hand hygiene promoted   rest/sleep promoted  Taken 09/10/2021 0300 by Virgil Benedict, RN  Infection Prevention:   hand hygiene promoted   rest/sleep promoted  Taken 09/10/2021 0100 by Virgil Benedict, RN  Infection Prevention:   hand hygiene promoted   rest/sleep promoted  Taken 09/09/2021 2300 by Virgil Benedict, RN  Infection Prevention:   hand hygiene promoted   rest/sleep promoted  Taken 09/09/2021 2100 by Virgil Benedict, RN  Infection Prevention:   hand hygiene promoted   rest/sleep promoted  Taken 09/09/2021 1902 by Virgil Benedict, RN  Infection Prevention:   hand hygiene promoted   rest/sleep promoted  Goal: Optimal Comfort and Wellbeing  Outcome: Progressing  Goal: Readiness for Transition of Care  Outcome: Progressing  Goal: Rounds/Family Conference  Outcome: Progressing     Problem: Fall Injury Risk  Goal: Absence of Fall and Fall-Related Injury  Outcome:  Progressing  Intervention: Promote Injury-Free Environment  Recent Flowsheet Documentation  Taken 09/10/2021 0500 by Virgil Benedict, RN  Safety Interventions:   fall reduction program maintained   lighting adjusted for tasks/safety   low bed   nonskid shoes/slippers when out of bed  Taken 09/10/2021 0300 by Virgil Benedict, RN  Safety Interventions:   fall reduction program maintained   lighting adjusted for tasks/safety   latex precautions   nonskid shoes/slippers when out of bed  Taken 09/10/2021 0100 by Virgil Benedict, RN  Safety Interventions:   fall reduction program maintained   lighting adjusted for tasks/safety   low bed   nonskid shoes/slippers when out of bed  Taken 09/09/2021 2300 by Virgil Benedict, RN  Safety Interventions:   fall reduction program maintained   lighting adjusted for tasks/safety   low bed   nonskid shoes/slippers when out of bed  Taken 09/09/2021 2100 by Virgil Benedict, RN  Safety Interventions:   fall reduction program maintained   lighting adjusted for tasks/safety   low bed   nonskid shoes/slippers when out of bed  Taken 09/09/2021 1902 by Virgil Benedict, RN  Safety Interventions:   fall reduction program maintained   lighting adjusted for tasks/safety   low bed   nonskid shoes/slippers when out of bed     Problem: Pain Acute  Goal: Acceptable Pain Control and Functional Ability  Outcome: Progressing     Problem: Pain Chronic (Persistent) (Comorbidity Management)  Goal: Acceptable Pain Control and Functional Ability  Outcome: Progressing

## 2021-09-11 LAB — PHOSPHORUS: PHOSPHORUS: 3.8 mg/dL (ref 2.4–5.1)

## 2021-09-11 LAB — BASIC METABOLIC PANEL
ANION GAP: 7 mmol/L (ref 5–14)
BLOOD UREA NITROGEN: 23 mg/dL (ref 9–23)
BUN / CREAT RATIO: 59
CALCIUM: 7.8 mg/dL — ABNORMAL LOW (ref 8.7–10.4)
CHLORIDE: 104 mmol/L (ref 98–107)
CO2: 28 mmol/L (ref 20.0–31.0)
CREATININE: 0.39 mg/dL — ABNORMAL LOW
EGFR CKD-EPI (2021) FEMALE: >90 mL/min/{1.73_m2} (ref >=60)
GLUCOSE RANDOM: 259 mg/dL — ABNORMAL HIGH (ref 70–179)
POTASSIUM: 3.6 mmol/L (ref 3.4–4.8)
SODIUM: 139 mmol/L (ref 135–145)

## 2021-09-11 LAB — CBC
HEMATOCRIT: 25.1 % — ABNORMAL LOW (ref 34.0–44.0)
HEMOGLOBIN: 8.1 g/dL — ABNORMAL LOW (ref 11.3–14.9)
MEAN CORPUSCULAR HEMOGLOBIN CONC: 32.2 g/dL (ref 32.0–36.0)
MEAN CORPUSCULAR HEMOGLOBIN: 26.6 pg (ref 25.9–32.4)
MEAN CORPUSCULAR VOLUME: 82.5 fL (ref 77.6–95.7)
MEAN PLATELET VOLUME: 8.8 fL (ref 6.8–10.7)
PLATELET COUNT: 174 10*9/L (ref 150–450)
RED BLOOD CELL COUNT: 3.04 10*12/L — ABNORMAL LOW (ref 3.95–5.13)
RED CELL DISTRIBUTION WIDTH: 16.4 % — ABNORMAL HIGH (ref 12.2–15.2)
WBC ADJUSTED: 4.6 10*9/L (ref 3.6–11.2)

## 2021-09-11 LAB — HEPATIC FUNCTION PANEL
ALBUMIN: 2 g/dL — ABNORMAL LOW (ref 3.4–5.0)
ALKALINE PHOSPHATASE: 62 U/L (ref 46–116)
ALT (SGPT): 16 U/L (ref 10–49)
AST (SGOT): 10 U/L (ref <=34)
BILIRUBIN DIRECT: 0.1 mg/dL (ref 0.00–0.30)
BILIRUBIN TOTAL: 0.2 mg/dL — ABNORMAL LOW (ref 0.3–1.2)
PROTEIN TOTAL: 4.7 g/dL — ABNORMAL LOW (ref 5.7–8.2)

## 2021-09-11 LAB — MAGNESIUM: MAGNESIUM: 1.7 mg/dL (ref 1.6–2.6)

## 2021-09-11 MED ORDER — SODIUM CHLORIDE 0.9 % (FLUSH) INJECTION SYRINGE
Freq: Every day | 0 refills | 30.00000 days
Start: 2021-09-11 — End: 2021-10-11

## 2021-09-11 MED ADMIN — piperacillin-tazobactam (ZOSYN) 3.375 g in sodium chloride 0.9 % (NS) 100 mL IVPB-connector bag: 3.375 g | INTRAVENOUS | @ 01:00:00 | Stop: 2021-09-15

## 2021-09-11 MED ADMIN — insulin NPH (HumuLIN,NovoLIN) injection 20 Units: 20 [IU] | SUBCUTANEOUS | @ 04:00:00

## 2021-09-11 MED ADMIN — metroNIDAZOLE (FLAGYL) tablet 500 mg: 500 mg | ORAL | @ 18:00:00 | Stop: 2021-09-23

## 2021-09-11 MED ADMIN — zinc oxide-cod liver oil (DESITIN 40%) Paste: TOPICAL | @ 18:00:00

## 2021-09-11 MED ADMIN — dexamethasone (DECADRON) 4 mg/mL injection 2 mg: 2 mg | INTRAVENOUS | @ 10:00:00 | Stop: 2021-09-11

## 2021-09-11 MED ADMIN — pantoprazole (PROTONIX) EC tablet 40 mg: 40 mg | ORAL | @ 14:00:00

## 2021-09-11 MED ADMIN — OLANZapine (ZyPREXA) tablet 5 mg: 5 mg | ORAL | @ 01:00:00

## 2021-09-11 MED ADMIN — fat emulsion 20 % with fish oil (SMOFLIPID) infusion 250 mL: 250 mL | INTRAVENOUS | @ 05:00:00 | Stop: 2021-09-11

## 2021-09-11 MED ADMIN — Adult Parenteral Nutrition: INTRAVENOUS | @ 05:00:00 | Stop: 2021-09-11

## 2021-09-11 MED ADMIN — enoxaparin (LOVENOX) syringe 40 mg: 40 mg | SUBCUTANEOUS | @ 01:00:00

## 2021-09-11 MED ADMIN — sodium chloride (NS) 0.9 % flush 10 mL: 10 mL | INTRAVENOUS | @ 08:00:00

## 2021-09-11 MED ADMIN — scopolamine (TRANSDERM-SCOP) 1 mg over 3 days topical patch 1 mg: 1 | TOPICAL | @ 19:00:00

## 2021-09-11 MED ADMIN — piperacillin-tazobactam (ZOSYN) 3.375 g in sodium chloride 0.9 % (NS) 100 mL IVPB-connector bag: 3.375 g | INTRAVENOUS | @ 08:00:00 | Stop: 2021-09-11

## 2021-09-11 MED ADMIN — levoFLOXacin (LEVAQUIN) tablet 750 mg: 750 mg | ORAL | @ 14:00:00 | Stop: 2021-09-23

## 2021-09-11 MED ADMIN — metroNIDAZOLE (FLAGYL) tablet 500 mg: 500 mg | ORAL | @ 14:00:00 | Stop: 2021-09-23

## 2021-09-11 MED ADMIN — OLANZapine (ZyPREXA) tablet 2.5 mg: 2.5 mg | ORAL | @ 14:00:00

## 2021-09-11 MED ADMIN — insulin regular (HumuLIN,NovoLIN) injection 0-20 Units: 0-20 [IU] | SUBCUTANEOUS | @ 10:00:00

## 2021-09-11 MED ADMIN — sodium chloride (NS) 0.9 % flush 10 mL: 10 mL | INTRAVENOUS | @ 23:00:00

## 2021-09-11 MED ADMIN — insulin regular (HumuLIN,NovoLIN) injection 0-20 Units: 0-20 [IU] | SUBCUTANEOUS | @ 18:00:00

## 2021-09-11 NOTE — Unmapped (Signed)
Palliative Care Progress Note    Consultation from Requesting Attending Physician:  Dossie Der,*  Primary Care Provider:  Carver Fila, MD  Code Status: Full      Assessment/Recommendation:   This 46 y.o. 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 ??Found to have malignant SBO. We are following for symptom management     Symptom Assessment and Recommendations:    #Pain due to partial SBO and cancer related abdominal process:   Improved over last 24 hours. Going for VIR drain placement of abd wall infection   - S/p TEG placement on 09/06/21  - Continue fentanyl patch at 50 mcg  - Continue 5 mg of highly concentrated 20 mg/ml roxanol Q 4 hrs PRN moderate/severe pain-- IV morphien 4 mg q4h prn if unable to take PO or pain unrelieved by oxycodone in 1 hour  - Keep naloxone PRN for respiratory depression  ??  #Nausea/Vomiting due to SBO: intermittent nausea, well controlled at this time.  - Continue decadron 2 mg daily x1 week followed by 1 mg daily x1 week.   - Continue PPI daily   - Continue compazine 5 mg Q8 hrs PO PRN N/V  - Continue scopolamine patch q 3days- need to watch for sedation  - Continue 5 mg PO olanzapine QHS and olanzapine 2.5 mg in the AM and 2.5 mg once daily PRN  - Continue senna 4 tabs daily (home regimen)  - Cont Miralax 17 gm daily  - daily dulcolax supp 10 mg    # Insomnia/Poor Appetite  - Continue olanzapine    #Anxiety   - continue hydroxyzine 25 mg q 6 hours PRN acute exacerbation    Goals of Care and Decision Making:     Decisional capacity at time of visit:  Full    Healthcare Decision Maker if lacks capacity: Name:  husband Molly Maduro, confirmed    Current Goals of care: Cancer-directed therapy in hopes to live as long as possible. Hoping for [place] and cytoxin outpatient   ??  Counseling and Coordination - Practical, Emotional, Spiritual Support Needs:    Palliative care visit today included focused interview, active listening, therapeutic use of silence, offering support, sharing empathy and summarization of today's discussion. Doing better with nursing.  -outpatient palliative care referral sent since she is being seen at [facility] for ongoing oncology care    Thank you for this consult. Please Epic Secure Chat me (M-F [id number]-[id number]) or page Palliative Care [phone]) on weekends/after hours if there are any questions.     Wanita Chamberlain, MD  [facility] & Palliative Care Attending    Subjective:  NEW EVENTS:  Much improved pain since yesterday. Going for drain placement today. Denies nausea    Review of Systems:  A 12 system review of systems was negative except as noted in HPI.    Objective:     Temp:  [36.4 ??C (97.5 ??F)-37 ??C (98.6 ??F)] 36.6 ??C (97.9 ??F)  Heart Rate:  [74-89] 74  Resp:  [13-19] 18  BP: (87-107)/(60-72) 93/66  SpO2:  [97 %-100 %] 100 %    No intake/output data recorded.    Physical Exam:  Conversant, NAD, calm    Test Results:  Lab Results   Component Value Date    WBC 6.5 09/10/2021  RBC 3.62 (L) 09/10/2021    HGB 9.7 (L) 09/10/2021    HCT 30.0 (L) 09/10/2021    MCV 82.8 09/10/2021    MCH 26.8 09/10/2021    MCHC 32.4 09/10/2021    RDW 16.1 (H) 09/10/2021    PLT 208 09/10/2021    MPV 8.7 09/10/2021     Lab Results   Component Value Date    NA 138 09/10/2021    K 3.8 09/10/2021    CL 104 09/10/2021    CO2 26.0 09/10/2021    BUN 20 09/10/2021    CREATININE 0.35 (L) 09/10/2021    GLU 212 (H) 09/10/2021    CALCIUM 8.3 (L) 09/10/2021    ALBUMIN 3.0 (L) 09/08/2021    PHOS 3.5 09/10/2021      Lab Results   Component Value Date    ALKPHOS 84 09/08/2021    BILITOT 0.3 09/08/2021    BILIDIR 0.10 09/08/2021    PROT 5.9 09/08/2021    ALBUMIN 3.0 (L) 09/08/2021    ALT 53 (H) 09/08/2021    AST 31 09/08/2021         I personally spent 35 minutes face-to-face and non-face-to-face in the care of this patient, which includes all pre, intra, and post visit time on the date of service.  All documented time was specific to the E/M visit and does not include any procedures that may have been performed.

## 2021-09-11 NOTE — Unmapped (Signed)
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 Pricilla Holm  - s/p XL with radical tumor debulking including total hysterectomy, bilateral salpingo-oophorectomy, infra-gastric omentectomy, and washings on 07/21/2019  - s/p 6 cycles carbo/taxol, completed 12/05/19  -CT 2/022 concerning for progression, started on carbo/gem 2/22-5/24/22.  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 No definitive succus seen intra-op. Treated conservatively with bowel rest and TPN for 1 month then had several I&D of recollected abscess at site of EC fistula with drain placed and left until clinical and imaging evidence of resolution    - Last imaging: CTAP - 08/01/21 with SBO, stable trace ascites, and recurrent anterior abdominal wall fistula vs. Abscess.   -12/29-08/07/21 admission for partial SBO and abdominal wall fluid collection, biopsy of fluid collection consistent at first for upper GI or pancreatobiliary primary   - Last CA-125: 120 on 08/02/21.   - Per GOC discussion w/ Palliative on 1/30, pt wants to continue cancer-directed therapy      SBO   -NGT deferred after TEG placement   -Palliative on board, continue bowel regiment of senna, miralax, and dulcolax  -VIR consulted > TEG placement 2/3  - CTAP on 1/29 to assess decompression per VIR: Overall, no significant change in appearance of complex, possibly closed loop, small bowel obstruction, multiple new locules of fluid and gas in the subcutaneous soft tissues, which may be related to enteric gas from enterocutaneous fistula versus superimposed associated abscesses; There appears to be irregular wall thickening of the medial gastric wall, GI consultation for possible endoscopy to exclude underlying gastric mass (primary or metastasis) is suggested.  -Surg oncology have signed off, defer G-tube to VIR as opposed to surgical management  - Nutrition consulted, TPN started (1/28- )  - The patient had several bouts of diarrhea and was tachycardic 2/5, WBC 13 > CTAP c/f infection    ECF - Hx abscesses - Leukocytosis   -brown drainage noted at bedside, no acute s/s infection   -wound dressing changed every other day > WOCN cs: zinc oxide barrier and dressings, no ostomy pouch   -was on empiric abx on admission which were dc'd on 1/16 after negative blood cultures   - CTAP 2/5 reveals increased soft tissue densities and locules of fluid and gas in the anterior abdominal wall subcutaneous soft tissues with associated increased adjacent inflammatory stranding and skin thickening. Findings may be related to superimposed infection in addition to patient's known enterocutaneous fistula.  - Rapid WBC uptrend to 13.4 on 2/5 > 6.5 (2/7) > AM pending  - Started on Zosyn (2/6-) > will transition to Augmentin today  - JP drain placed yesterday by VIR, aspirated 10 ml of purulent fluid and sent for culture      N/V  - Pall cs: Comp/Reglan, IV Dex, scop patch, Dex BID. Ok to continue octreotide though likely not able to outpatient  - Dex Taper schedule as follows: 4 mg daily x1 week (1/30), 2 mg daily x1 week (2/7), 1 mg daily  x1 week (2/14).      Pain   -Palliative following, pain well managed with fentanyl patch, Tylenol PRN, 20mg /ml roxanol q4 hours PRN, Naloxone PRN, IV morphine PRN, tizanidine     Poor Nutrition   - Nutrition/Palliative following, on olanzapine; increase calories in TPN and add octreotide to TPN   - TPN running (1/27-), cycling at night > advances to CLD  - Start POC glucose checks q6h w/ SSI (1/30-) and 4 units NPH at time of TPN (2/1) > NPH increased to 20 units (2/6)     Anxiety   -Palliative on board, rec considering hydroxyzine     Anemia  -likely chronic disease, no acute concern for bleeding  -s/p 1u PRBC on 1/28 to optimize prior to chemotherapy, Hgb 7.3 > 9.1    Thyroid Function  - TSH .237, Free T4 .54  - Consistent with Sick Euthyroid Syndrome      Prophylaxis: Lovenox, held in preparation for procedure.      Code Status: full code, confirmed on admission.      Disposition: floor, PT/OT consulted-->HH     Plan discussed with Dr. Alvester Morin and Dr. Ginnie Smart.     Subjective:  Patient reports that she continues to feel better. She went several hours without voiding yesterday but had a large void around 3 am. She reports she had a soft bowel movement overnight as well. No nausea. Pain well controlled.     Objective:  Temp:  [36.4 ??C (97.5 ??F)-36.6 ??C (97.9 ??F)] 36.4 ??C (97.5 ??F)  Heart Rate:  [72-89] 72  Resp:  [13-19] 18  BP: (87-107)/(60-72) 94/65  SpO2:  [97 %-100 %] 100 %    Gen: NAD, awake, alert, oriented  HEENT: TEG dressing C/D/I  CV: regular rate and rhythm   Pulm: clear to auscultation bilaterally, normal work of breathing  Abd:   Non-tender, non-distended. Diminished bowel sounds in all 4 quadrants. No rebound or guarding. Midline abdominal mass non tender. Abdominal wound with dressing, clean and dry. Lidocaine patches in place. JP draining milk purulent fluid from left mid abdomen, dressing C/D/I  GU: deferred    Medications (scheduled)   ??? [START ON 09/17/2021] dexamethasone  1 mg Intravenous Q24H   ??? dexamethasone  2 mg Intravenous Q24H   ??? enoxaparin (LOVENOX) injection  40 mg Subcutaneous Q24H SCH   ??? fentaNYL  1 patch Transdermal Q72H   ??? flu vacc qs2022-23 6mos up(PF)  0.5 mL Intramuscular During hospitalization   ??? insulin NPH  20 Units Subcutaneous Q24H   ??? insulin regular  0-20 Units Subcutaneous Q6H SCH   ??? lidocaine  1 patch Transdermal Daily   ??? OLANZapine  2.5 mg Oral Daily   ??? OLANZapine 5 mg Oral Nightly   ??? pantoprazole  40 mg Oral Daily   ??? piperacillin-tazobactam (ZOSYN) IV (intermittent)  3.375 g Intravenous Q6H   ??? scopolamine  1 patch Topical Q72H   ??? scopolamine  1 patch Topical Once   ??? sodium chloride  10 mL Intravenous Q8H   ??? sodium chloride  10 mL Intravenous Q8H   ??? sodium chloride  10 mL Intravenous Q8H   ??? zinc oxide-cod liver oil   Topical Daily       Medications (prn)  acetaminophen, bisacodyL, calcium carbonate, dextrose in water, emollient combination no.92, glucagon, glucose, magic mouthwash oral, metoclopramide, MORPhine, MORPhine injection, naloxone, OLANZapine, polyethylene glycol, senna, simethicone, tiZANidine    Labs:   Lab Results   Component Value Date  WBC 6.5 09/10/2021    HGB 9.7 (L) 09/10/2021    HCT 30.0 (L) 09/10/2021    PLT 208 09/10/2021       Lab Results   Component Value Date    NA 138 09/10/2021    K 3.8 09/10/2021    CL 104 09/10/2021    CO2 26.0 09/10/2021    BUN 20 09/10/2021    CREATININE 0.35 (L) 09/10/2021    GLU 212 (H) 09/10/2021    CALCIUM 8.3 (L) 09/10/2021    MG 1.7 09/10/2021    PHOS 3.5 09/10/2021       Lab Results   Component Value Date    BILITOT 0.3 09/08/2021    BILIDIR 0.10 09/08/2021    PROT 5.9 09/08/2021    ALBUMIN 3.0 (L) 09/08/2021    ALT 53 (H) 09/08/2021    AST 31 09/08/2021    ALKPHOS 84 09/08/2021       Lab Results   Component Value Date    INR 1.16 09/10/2021    APTT 23.4 (L) 09/10/2021

## 2021-09-11 NOTE — Unmapped (Signed)
VSS. A+Ox4. Adequate UOP overnight. Pt. Remains NPO, denies nausea. No c/o pain overnight. JP w/ milky/purulent drainage, flushed per order. TPN hung as ordered. Husband at bedside. Bed is low, locked, and call bell is within reach.     Problem: Adult Inpatient Plan of Care  Goal: Plan of Care Review  Outcome: Progressing  Goal: Patient-Specific Goal (Individualized)  Outcome: Progressing  Goal: Absence of Hospital-Acquired Illness or Injury  Outcome: Progressing  Intervention: Identify and Manage Fall Risk  Recent Flowsheet Documentation  Taken 09/10/2021 1901 by Simone Curia, RN  Safety Interventions:   fall reduction program maintained   family at bedside   lighting adjusted for tasks/safety   low bed  Goal: Optimal Comfort and Wellbeing  Outcome: Progressing     Problem: Fall Injury Risk  Goal: Absence of Fall and Fall-Related Injury  Outcome: Progressing  Intervention: Promote Injury-Free Environment  Recent Flowsheet Documentation  Taken 09/10/2021 1901 by Simone Curia, RN  Safety Interventions:   fall reduction program maintained   family at bedside   lighting adjusted for tasks/safety   low bed     Problem: Pain Acute  Goal: Acceptable Pain Control and Functional Ability  Outcome: Progressing

## 2021-09-11 NOTE — Unmapped (Signed)
Pt. Up independantly walking off unit with husband. Pt. Had TPN with lipids and taking down as ordered. Pt. Denies any pain or n/v. VSS. JP draining purulent drainage. JP Drain flush teaching demonstrated to husband and patient and appears comfortable with care. No other voice concerns. Will continue to monitor.     Problem: Adult Inpatient Plan of Care  Goal: Plan of Care Review  Outcome: Progressing  Goal: Patient-Specific Goal (Individualized)  Outcome: Progressing  Goal: Absence of Hospital-Acquired Illness or Injury  Outcome: Progressing  Intervention: Identify and Manage Fall Risk  Recent Flowsheet Documentation  Taken 09/11/2021 0700 by Jory Sims, RN  Safety Interventions:   family at bedside   low bed  Goal: Optimal Comfort and Wellbeing  Outcome: Progressing  Goal: Readiness for Transition of Care  Outcome: Progressing  Goal: Rounds/Family Conference  Outcome: Progressing     Problem: Fall Injury Risk  Goal: Absence of Fall and Fall-Related Injury  Outcome: Progressing  Intervention: Promote Scientist, clinical (histocompatibility and immunogenetics) Documentation  Taken 09/11/2021 0700 by Jory Sims, RN  Safety Interventions:   family at bedside   low bed     Problem: Pain Acute  Goal: Acceptable Pain Control and Functional Ability  Outcome: Progressing     Problem: Pain Chronic (Persistent) (Comorbidity Management)  Goal: Acceptable Pain Control and Functional Ability  Outcome: Progressing

## 2021-09-11 NOTE — Unmapped (Signed)
Palliative Care Progress Note    Consultation from Requesting Attending Physician:  Dossie Der,*  Primary Care Provider:  Carver Fila, MD  Code Status: Full      Assessment/Recommendation:   This 46 y.o. 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 ??Found to have malignant SBO. We are following for symptom management     Following recs for discharge (please send 30 day supply for meds):   - Fentanyl patch 50 mcg q72hr  - morphine solution 5 mg q4h prn severe pain (can send highly concentrated solution 20 mg/1 ml so would take 0.25 ml q4h prn--please send 30 mL as this is what bottle comes in)  - Decadron taper: 2 mg daily x1 week (2/7-2/13), 1 mg daily x1 week (2/14-2/20, off  - Oral metoclopramide 5 mg daily scheduled with BID PRN nausea available.   - Oral olanzapine 2.5 mg morning and 5 mg nightly   - Scopolamine patch 1 patch q72 hours--patient does not need prescribed as she has at home   - Miralax daily schedule and up to 3 times a day as needed   - Senna 2 tabs BID PRN constipation  - Bisacodyl suppository 10 mg q daily prn for no BM in 72 hours  - outpatient palliative care referral sent     Symptom Assessment and Recommendations:    #Pain due to partial SBO and cancer related abdominal process:   Improved  - S/p TEG placement on 09/06/21  - Continue fentanyl patch at 50 mcg  - Continue 5 mg of highly concentrated 20 mg/ml roxanol Q 4 hrs PRN moderate/severe pain-- IV morphien 4 mg q4h prn if unable to take PO or pain unrelieved by oxycodone in 1 hour  - Keep naloxone PRN for respiratory depression  ??  #Nausea/Vomiting due to SBO: intermittent nausea, well controlled at this time.  - Continue decadron 2 mg daily x1 week followed by 1 mg daily x1 week.   - Continue PPI daily   - Continue compazine 5 mg Q8 hrs PO PRN N/V  - Continue scopolamine patch q 3days  - Continue 5 mg PO olanzapine QHS and olanzapine 2.5 mg in the AM and 2.5 mg once daily PRN  - Continue senna 4 tabs daily (home regimen)  - Cont Miralax 17 gm daily  - daily dulcolax supp 10 mg    # Insomnia/Poor Appetite  - Continue olanzapine    #Anxiety   - continue hydroxyzine 25 mg q 6 hours PRN acute exacerbation    Goals of Care and Decision Making:     Decisional capacity at time of visit:  Full    Healthcare Decision Maker if lacks capacity: Name:  husband Molly Maduro, confirmed    Current Goals of care: Cancer-directed therapy in hopes to live as long as possible. Hoping for [place] and cytoxin outpatient   ??  Counseling and Coordination - Practical, Emotional, Spiritual Support Needs:    Palliative care visit today included focused interview, active listening, therapeutic use of silence, offering support, sharing empathy and summarization of today's discussion. Doing better with nursing.  -outpatient palliative care referral sent since she is being seen at [facility] for ongoing oncology care    Thank you for this consult. Please Epic Secure Chat me (M-F [phone]) or page Palliative  Care [phone]) on weekends/after hours if there are any questions.     Wanita Chamberlain, MD  [facility] & Palliative Care Attending    Subjective:  NEW EVENTS:  S/p drain placement yesterday for abdominal wall infection.  She says her pain has been well controlled and has not needed any as needed's in the last 24 hours.  She is looking forward to going home potentially tomorrow if she does not have a fever.    Review of Systems:  A 12 system review of systems was negative except as noted in HPI.    Objective:     Temp:  [36.4 ??C (97.5 ??F)-36.7 ??C (98 ??F)] 36.7 ??C (98 ??F)  Heart Rate:  [72-84] 84  Resp:  [18] 18  BP: (93-98)/(65-66) 98/65  SpO2:  [99 %-100 %] 99 %    I/O this shift:  In: -   Out: 420 [Urine:400; Drains:20]    Physical Exam:  Standing, packing her bags, pleasant, euthymic, conversant    I personally spent 41 minutes face-to-face and non-face-to-face in the care of this patient, which includes all pre, intra, and post visit time on the date of service.  All documented time was specific to the E/M visit and does not include any procedures that may have been performed.

## 2021-09-12 ENCOUNTER — Other Ambulatory Visit (HOSPITAL_COMMUNITY): Payer: Self-pay

## 2021-09-12 LAB — BASIC METABOLIC PANEL
ANION GAP: 5 mmol/L (ref 5–14)
BLOOD UREA NITROGEN: 22 mg/dL (ref 9–23)
BUN / CREAT RATIO: 56
CALCIUM: 8.1 mg/dL — ABNORMAL LOW (ref 8.7–10.4)
CHLORIDE: 105 mmol/L (ref 98–107)
CO2: 30 mmol/L (ref 20.0–31.0)
CREATININE: 0.39 mg/dL — ABNORMAL LOW
EGFR CKD-EPI (2021) FEMALE: >90 mL/min/{1.73_m2} (ref >=60)
GLUCOSE RANDOM: 251 mg/dL — ABNORMAL HIGH (ref 70–99)
POTASSIUM: 3.4 mmol/L (ref 3.4–4.8)
SODIUM: 140 mmol/L (ref 135–145)

## 2021-09-12 LAB — MAGNESIUM: MAGNESIUM: 1.7 mg/dL (ref 1.6–2.6)

## 2021-09-12 LAB — CBC
HEMATOCRIT: 24.3 % — ABNORMAL LOW (ref 34.0–44.0)
HEMOGLOBIN: 8 g/dL — ABNORMAL LOW (ref 11.3–14.9)
MEAN CORPUSCULAR HEMOGLOBIN CONC: 32.8 g/dL (ref 32.0–36.0)
MEAN CORPUSCULAR HEMOGLOBIN: 27.1 pg (ref 25.9–32.4)
MEAN CORPUSCULAR VOLUME: 82.5 fL (ref 77.6–95.7)
MEAN PLATELET VOLUME: 8 fL (ref 6.8–10.7)
PLATELET COUNT: 186 10*9/L (ref 150–450)
RED BLOOD CELL COUNT: 2.95 10*12/L — ABNORMAL LOW (ref 3.95–5.13)
RED CELL DISTRIBUTION WIDTH: 16.5 % — ABNORMAL HIGH (ref 12.2–15.2)
WBC ADJUSTED: 5 10*9/L (ref 3.6–11.2)

## 2021-09-12 LAB — HEMOGLOBIN A1C
ESTIMATED AVERAGE GLUCOSE: 154 mg/dL
HEMOGLOBIN A1C: 7 % — ABNORMAL HIGH (ref 4.8–5.6)

## 2021-09-12 LAB — PHOSPHORUS: PHOSPHORUS: 4.2 mg/dL (ref 2.4–5.1)

## 2021-09-12 MED ORDER — INSULIN U-100 REGULAR HUMAN 100 UNIT/ML INJECTION SOLUTION
Freq: Four times a day (QID) | SUBCUTANEOUS | 0 refills | 30 days | Status: CN
Start: 2021-09-12 — End: 2021-10-12

## 2021-09-12 MED ORDER — BLOOD GLUCOSE TEST STRIPS
ORAL_STRIP | 0 refills | 0.00000 days | Status: CN
Start: 2021-09-12 — End: ?

## 2021-09-12 MED ORDER — BISACODYL 10 MG RECTAL SUPPOSITORY
Freq: Every day | RECTAL | 0 refills | 12.00000 days | Status: CP | PRN
Start: 2021-09-12 — End: 2021-10-12
  Filled 2021-09-12: qty 12, 12d supply, fill #0

## 2021-09-12 MED ORDER — OLANZAPINE 5 MG TABLET
ORAL_TABLET | Freq: Every evening | ORAL | 0 refills | 30 days | Status: CP
Start: 2021-09-12 — End: 2021-10-12

## 2021-09-12 MED ORDER — PEN NEEDLE, DIABETIC 32 GAUGE X 5/32" (4 MM)
0 refills | 0.00000 days | Status: CN
Start: 2021-09-12 — End: ?

## 2021-09-12 MED ORDER — HYDROXYZINE HCL 25 MG TABLET
ORAL_TABLET | Freq: Four times a day (QID) | ORAL | 0 refills | 8.00000 days | Status: CP | PRN
Start: 2021-09-12 — End: 2021-09-26
  Filled 2021-09-12: qty 30, 8d supply, fill #0

## 2021-09-12 MED ORDER — LANCETS
0 refills | 0 days | Status: CN
Start: 2021-09-12 — End: ?

## 2021-09-12 MED ORDER — MORPHINE CONCENTRATE 100 MG/5 ML (20 MG/ML) ORAL SOLUTION
ORAL | 0 refills | 5.00000 days | Status: CP | PRN
Start: 2021-09-12 — End: 2021-09-17
  Filled 2021-09-12: qty 7.5, 5d supply, fill #0

## 2021-09-12 MED ORDER — INSULIN LISPRO (U-100) 100 UNIT/ML SUBCUTANEOUS PEN
0 refills | 0.00000 days | Status: CP
Start: 2021-09-12 — End: ?
  Filled 2021-09-12: qty 10, 31d supply, fill #0

## 2021-09-12 MED ORDER — SENNOSIDES 8.6 MG TABLET
ORAL_TABLET | Freq: Two times a day (BID) | ORAL | 0 refills | 8 days | Status: CP | PRN
Start: 2021-09-12 — End: 2021-10-12
  Filled 2021-09-12: qty 30, 8d supply, fill #0

## 2021-09-12 MED ORDER — INSULIN NPH ISOPHANE U-100 HUMAN 100 UNIT/ML SUBCUTANEOUS SUSPENSION
Freq: Every day | SUBCUTANEOUS | 0 refills | 50.00000 days | Status: CP
Start: 2021-09-12 — End: 2021-10-13

## 2021-09-12 MED ORDER — ZINC OXIDE-COD LIVER OIL 40 % TOPICAL PASTE
Freq: Every day | TOPICAL | 0 refills | 0.00000 days | Status: CP
Start: 2021-09-12 — End: ?

## 2021-09-12 MED ORDER — SODIUM CHLORIDE 0.9 % (FLUSH) INJECTION SYRINGE
Freq: Every day | 0 refills | 30.00000 days | Status: CP
Start: 2021-09-12 — End: 2021-10-12
  Filled 2021-09-12: qty 150, 30d supply, fill #0

## 2021-09-12 MED ORDER — SIMETHICONE 80 MG CHEWABLE TABLET
ORAL_TABLET | Freq: Four times a day (QID) | ORAL | 0 refills | 25.00000 days | Status: CP | PRN
Start: 2021-09-12 — End: 2021-10-12
  Filled 2021-09-12: qty 100, 25d supply, fill #0

## 2021-09-12 MED ORDER — INSULIN REGULAR U-100 HUMAN 100 UNIT/ML (3 ML) SUBCUTANEOUS PEN
Freq: Four times a day (QID) | SUBCUTANEOUS | 0 refills | 30 days | Status: CP | PRN
Start: 2021-09-12 — End: 2021-09-12

## 2021-09-12 MED ORDER — METRONIDAZOLE 500 MG TABLET
ORAL_TABLET | Freq: Three times a day (TID) | ORAL | 0 refills | 10 days | Status: CP
Start: 2021-09-12 — End: 2021-09-22
  Filled 2021-09-12: qty 30, 10d supply, fill #0

## 2021-09-12 MED ORDER — GLUCOSE 4 GRAM CHEWABLE TABLET
ORAL_TABLET | ORAL | 2 refills | 1.00000 days | Status: CP | PRN
Start: 2021-09-12 — End: 2021-10-12

## 2021-09-12 MED ORDER — INSULIN LISPRO (1 UNIT DIAL) 100 UNIT/ML (KWIKPEN)
PEN_INJECTOR | SUBCUTANEOUS | 0 refills | Status: AC
  Filled 2021-09-12: qty 24, 30d supply, fill #0
  Filled 2021-09-14: qty 3, 15d supply, fill #0

## 2021-09-12 MED ORDER — GLUCOSE 4 G PO CHEW
CHEWABLE_TABLET | ORAL | 2 refills | Status: AC
  Filled 2021-09-12: qty 50, 30d supply, fill #0

## 2021-09-12 MED ADMIN — sodium chloride (NS) 0.9 % flush 10 mL: 10 mL | INTRAVENOUS | @ 14:00:00 | Stop: 2021-09-12

## 2021-09-12 MED ADMIN — metroNIDAZOLE (FLAGYL) tablet 500 mg: 500 mg | ORAL | @ 19:00:00 | Stop: 2021-09-12

## 2021-09-12 MED ADMIN — insulin regular (HumuLIN,NovoLIN) injection 0-20 Units: 0-20 [IU] | SUBCUTANEOUS | @ 11:00:00 | Stop: 2021-09-12

## 2021-09-12 MED ADMIN — potassium chloride 20 mEq in 100 mL IVPB Premix: 20 meq | INTRAVENOUS | @ 16:00:00 | Stop: 2021-09-12

## 2021-09-12 MED ADMIN — enoxaparin (LOVENOX) syringe 40 mg: 40 mg | SUBCUTANEOUS | @ 02:00:00

## 2021-09-12 MED ADMIN — potassium chloride 20 mEq in 100 mL IVPB Premix: 20 meq | INTRAVENOUS | @ 14:00:00 | Stop: 2021-09-12

## 2021-09-12 MED ADMIN — sodium chloride (NS) 0.9 % flush 10 mL: 10 mL | INTRAVENOUS | @ 09:00:00 | Stop: 2021-09-12

## 2021-09-12 MED ADMIN — metroNIDAZOLE (FLAGYL) tablet 500 mg: 500 mg | ORAL | @ 14:00:00 | Stop: 2021-09-12

## 2021-09-12 MED ADMIN — OLANZapine (ZyPREXA) tablet 2.5 mg: 2.5 mg | ORAL | @ 14:00:00 | Stop: 2021-09-12

## 2021-09-12 MED ADMIN — dexAMETHasone (DECADRON) tablet 2 mg: 2 mg | ORAL | @ 11:00:00 | Stop: 2021-09-12

## 2021-09-12 MED ADMIN — pantoprazole (PROTONIX) EC tablet 40 mg: 40 mg | ORAL | @ 14:00:00 | Stop: 2021-09-12

## 2021-09-12 MED ADMIN — insulin regular (HumuLIN,NovoLIN) injection 0-20 Units: 0-20 [IU] | SUBCUTANEOUS | @ 17:00:00 | Stop: 2021-09-12

## 2021-09-12 MED ADMIN — metroNIDAZOLE (FLAGYL) tablet 500 mg: 500 mg | ORAL | @ 02:00:00 | Stop: 2021-09-23

## 2021-09-12 MED ADMIN — fat emulsion 20 % with fish oil (SMOFLIPID) infusion 250 mL: 250 mL | INTRAVENOUS | @ 05:00:00 | Stop: 2021-09-12

## 2021-09-12 MED ADMIN — OLANZapine (ZyPREXA) tablet 5 mg: 5 mg | ORAL | @ 02:00:00

## 2021-09-12 MED ADMIN — levoFLOXacin (LEVAQUIN) tablet 750 mg: 750 mg | ORAL | @ 14:00:00 | Stop: 2021-09-12

## 2021-09-12 MED ADMIN — fentaNYL (DURAGESIC) 50 mcg/hr 1 patch: 1 | TRANSDERMAL | @ 19:00:00 | Stop: 2021-09-12

## 2021-09-12 MED ADMIN — insulin NPH (HumuLIN,NovoLIN) injection 20 Units: 20 [IU] | SUBCUTANEOUS | @ 04:00:00

## 2021-09-12 MED ADMIN — lidocaine (LIDODERM) 5 % patch 1 patch: 1 | TRANSDERMAL | @ 14:00:00 | Stop: 2021-09-12

## 2021-09-12 MED ADMIN — Adult Parenteral Nutrition: INTRAVENOUS | @ 05:00:00 | Stop: 2021-09-12

## 2021-09-12 NOTE — Unmapped (Signed)
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 Pricilla Holm  - s/p XL with radical tumor debulking including total hysterectomy, bilateral salpingo-oophorectomy, infra-gastric omentectomy, and washings on 07/21/2019  - s/p 6 cycles carbo/taxol, completed 12/05/19  -CT 2/022 concerning for progression, started on carbo/gem 2/22-5/24/22.  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 No definitive succus seen intra-op. Treated conservatively with bowel rest and TPN for 1 month then had several I&D of recollected abscess at site of EC fistula with drain placed and left until clinical and imaging evidence of resolution    - Last imaging: CTAP - 08/01/21 with SBO, stable trace ascites, and recurrent anterior abdominal wall fistula vs. Abscess.   -12/29-08/07/21 admission for partial SBO and abdominal wall fluid collection, biopsy of fluid collection consistent at first for upper GI or pancreatobiliary primary   - Last CA-125: 120 on 08/02/21.   - Per GOC discussion w/ Palliative on 1/30, pt wants to continue cancer-directed therapy      SBO   -NGT deferred after TEG placement   -Palliative on board, continue bowel regiment of senna, miralax, and dulcolax  -VIR consulted > TEG placement 2/3  - CTAP on 1/29 to assess decompression per VIR: Overall, no significant change in appearance of complex, possibly closed loop, small bowel obstruction, multiple new locules of fluid and gas in the subcutaneous soft tissues, which may be related to enteric gas from enterocutaneous fistula versus superimposed associated abscesses; There appears to be irregular wall thickening of the medial gastric wall, GI consultation for possible endoscopy to exclude underlying gastric mass (primary or metastasis) is suggested.  -Surg oncology have signed off, defer G-tube to VIR as opposed to surgical management  - Nutrition consulted, TPN started (1/28- )  - The patient had several bouts of diarrhea and was tachycardic 2/5, WBC 13 > CTAP c/f infection    ECF - Hx abscesses - Leukocytosis   -brown drainage noted at bedside, no acute s/s infection   -wound dressing changed every other day > WOCN cs: zinc oxide barrier and dressings, no ostomy pouch   -was on empiric abx on admission which were dc'd on 1/16 after negative blood cultures   - CTAP 2/5 reveals increased soft tissue densities and locules of fluid and gas in the anterior abdominal wall subcutaneous soft tissues with associated increased adjacent inflammatory stranding and skin thickening. Findings may be related to superimposed infection in addition to patient's known enterocutaneous fistula.  - Rapid WBC uptrend to 13.4 on 2/5 > 5.0 this AM  - Started on Zosyn (2/6-) > Started on Levofloxacin and Flagyl yesterday, will continue until 2/20  - S/p JP drain placed 2/7 by VIR, aspirated 10 ml of purulent fluid and sent for culture > Cx 2+ GNR    N/V  - Pall cs: Comp/Reglan, IV Dex, scop patch, Dex BID. Ok to continue octreotide though likely not able to outpatient  - Dex Taper schedule as follows: 4 mg daily x1 week (1/30), 2 mg daily  x1 week (2/7), 1 mg daily x1 week (2/14).      Pain   -Palliative following, pain well managed with fentanyl patch, Tylenol PRN, 20mg /ml roxanol q4 hours PRN, Naloxone PRN, IV morphine PRN, tizanidine  - Discharge recs: morphine solution 5 mg q4h prn severe pain (can send highly concentrated solution 20 mg/1 ml so would take 0.25 ml q4h prn - will send 30 mL as this is what bottle comes in Poor Nutrition   - Nutrition/Palliative following, on olanzapine; increase calories in TPN and add octreotide to TPN   - TPN running (1/27-), cycling at night > advanced to CLD  - Start POC glucose checks q6h w/ SSI (1/30-) and 4 units NPH at time of TPN (2/1) > NPH increased to 20 units (2/6)     Anxiety   -Palliative on board, PRN hydroxyzine     Anemia  -likely chronic disease, no acute concern for bleeding  -s/p 1u PRBC on 1/28 to optimize prior to chemotherapy, Hgb 7.3 > 9.1    Thyroid Function  - TSH .237, Free T4 .54  - Consistent with Sick Euthyroid Syndrome      Prophylaxis: Lovenox       Code Status: full code, confirmed on admission.      Disposition: floor, PT/OT consulted-->HH     Plan discussed with Dr. Alvester Morin and Dr. Ginnie Smart.     Subjective:  Patient reports that she continues to feel better. No nausea. Pain well controlled. She tolerated all of her PO medications yesterday. She did her own wound change, Lvx injection, and JP flush yesterday.     Objective:  Temp:  [36.7 ??C (98 ??F)-36.9 ??C (98.4 ??F)] 36.8 ??C (98.2 ??F)  Heart Rate:  [78-87] 87  Resp:  [18] 18  BP: (98-109)/(65-84) 100/73  SpO2:  [99 %-100 %] 100 %    Gen: NAD, awake, alert, oriented  HEENT: TEG dressing C/D/I, bilious green drainage  CV: regular rate and rhythm   Pulm: clear to auscultation bilaterally, normal work of breathing  Abd:   Non-tender, non-distended. Diminished bowel sounds in all 4 quadrants. No rebound or guarding. Midline abdominal mass non tender. Abdominal wound with dressing, clean and dry. Lidocaine patches in place. JP draining milk purulent fluid from left mid abdomen, dressing C/D/I  GU: deferred    Medications (scheduled)   ??? [START ON 09/17/2021] dexAMETHasone  1 mg Oral Daily   ??? dexAMETHasone  2 mg Oral Daily   ??? enoxaparin (LOVENOX) injection  40 mg Subcutaneous Q24H SCH   ??? fentaNYL  1 patch Transdermal Q72H   ??? flu vacc qs2022-23 6mos up(PF)  0.5 mL Intramuscular During hospitalization   ??? insulin NPH  20 Units Subcutaneous Q24H   ??? insulin regular  0-20 Units Subcutaneous Q6H SCH   ??? levoFLOXacin  750 mg Oral Daily   ??? lidocaine  1 patch Transdermal Daily   ??? metroNIDAZOLE  500 mg Oral TID   ??? OLANZapine  2.5 mg Oral Daily   ??? OLANZapine  5 mg Oral Nightly   ??? pantoprazole  40 mg Oral Daily   ??? scopolamine  1 patch Topical Q72H   ??? scopolamine  1 patch Topical Once   ??? sodium chloride  10 mL Intravenous Q8H   ??? sodium chloride  10 mL Intravenous Q8H   ??? sodium chloride  10 mL Intravenous Q8H   ??? zinc oxide-cod liver oil   Topical Daily       Medications (prn)  acetaminophen, bisacodyL, calcium carbonate, dextrose in water, emollient combination no.92, glucagon, glucose, hydrOXYzine, magic mouthwash oral, MORPhine, MORPhine injection, naloxone, OLANZapine, polyethylene glycol, prochlorperazine, senna, simethicone, tiZANidine    Labs:   Lab Results   Component Value Date    WBC 5.0 09/12/2021    HGB 8.0 (L) 09/12/2021    HCT 24.3 (L) 09/12/2021    PLT 186 09/12/2021       Lab Results   Component Value Date    NA 140 09/12/2021    K 3.4 09/12/2021    CL 105 09/12/2021    CO2 30.0 09/12/2021    BUN 22 09/12/2021    CREATININE 0.39 (L) 09/12/2021    GLU 251 (H) 09/12/2021    CALCIUM 8.1 (L) 09/12/2021    MG 1.7 09/12/2021    PHOS 4.2 09/12/2021       Lab Results   Component Value Date    BILITOT 0.2 (L) 09/11/2021    BILIDIR 0.10 09/11/2021    PROT 4.7 (L) 09/11/2021    ALBUMIN 2.0 (L) 09/11/2021    ALT 16 09/11/2021    AST 10 09/11/2021    ALKPHOS 62 09/11/2021       Lab Results   Component Value Date    INR 1.16 09/10/2021    APTT 23.4 (L) 09/10/2021     Scribe's Attestation: Warden Fillers, MD obtained and performed the history, physical exam and medical decision making elements that were  entered into the chart. Documentation assistance was provided by me personally, a scribe. Signed by Freddie Apley Scribe, on September 12, 2021 at 4:57 AM. ----------------------------------------------------------------------------------------------------------------------  September 12, 2021 5:55 AM. Documentation assistance provided by the Scribe. I was present during the time the encounter was recorded. The information recorded by the Scribe was done at my direction and has been reviewed and validated by me.  ----------------------------------------------------------------------------------------------------------------------

## 2021-09-12 NOTE — Unmapped (Signed)
VSS. Pt. Remains afebrile. Pt. Changed Fistula dressing overnight, declining to put zinc oxide on. Anti-fungal powder, no sting barrier spray, gauze, and abd utilized. Pt. Denies pain. Pt. Flushed JP drain independently and self administered lovenox. Tpn and lipids hung as ordered. Bed is low, locked, and call bell is within reach.     Problem: Adult Inpatient Plan of Care  Goal: Plan of Care Review  Outcome: Progressing  Goal: Patient-Specific Goal (Individualized)  Outcome: Progressing  Goal: Absence of Hospital-Acquired Illness or Injury  Outcome: Progressing  Intervention: Identify and Manage Fall Risk  Recent Flowsheet Documentation  Taken 09/11/2021 1901 by Simone Curia, RN  Safety Interventions:   fall reduction program maintained   family at bedside   lighting adjusted for tasks/safety   low bed   nonskid shoes/slippers when out of bed  Goal: Optimal Comfort and Wellbeing  Outcome: Progressing     Problem: Fall Injury Risk  Goal: Absence of Fall and Fall-Related Injury  Outcome: Progressing  Intervention: Promote Injury-Free Environment  Recent Flowsheet Documentation  Taken 09/11/2021 1901 by Simone Curia, RN  Safety Interventions:   fall reduction program maintained   family at bedside   lighting adjusted for tasks/safety   low bed   nonskid shoes/slippers when out of bed     Problem: Pain Acute  Goal: Acceptable Pain Control and Functional Ability  Outcome: Progressing     Problem: Pain Chronic (Persistent) (Comorbidity Management)  Goal: Acceptable Pain Control and Functional Ability  Outcome: Progressing

## 2021-09-12 NOTE — Unmapped (Addendum)
Diabetes education consultation: Consulted for assistance with providing instruction on diabetes self-management skills. Visit with Colleen Russo and husband at the bedside. Provided 45 minutes teaching diabetes education.     Assessment: Colleen Russo admitted wit seriously ill due to SBOs, complicated by co-morbid acute and chronic conditions including??PMHx of clear-cell ovarian cancer??(diagnosed 07/2019),??multiple SBOs and abdominal surgeries c/b entero-cutaneous fistula and abscess??that presented to????with??several days of severe nausea vomiting, abdominal pain and cramping.  See MAR for medication list.      A1C:    Lab Results   Component Value Date    A1C 7.0 (H) 09/12/2021       Insulin administration & safety: Discussed discharge regimen, explaining long-acting vs rapid acting action, timing of administration & described correctional sliding scale & connection to glucose monitoring.      Per Dr. Raiford Simmonds confirmed discharge plan below:  Reviewed insulin below with both and Colleen Russo and husband were able to identify the correct amount of insulin for each example.   NPH Inject 20 Units under the skin daily. Inject 1 hour prior to starting TPN infusion    Correctional Scale:  Blood Glucose                                             Treatment     lower than 70 Give 3-4 glucose tablets and recheck glucose and retreat until over 70  160-200       1 units    201-240       4 units    241-280       6 units    281-300        8 units    More than 300     10 units    Call the Doctor: If blood glucose < 51 or > 300  Colleen Russo shared she uses insulin vials and syringes.   Requested that clinical nursing staff have Colleen Russo practice drawing up insulin with vials and syringes and inject insulin doses, so she can get practice with self-injection.  Also asked that they review correctional scale as well.     Problem-solving:   Hypoglycemia:    Instructed on hypoglycemia recognition & treatment with glucose tablets, emphasizing importance of having some readily available. Described possible causes and prevention.  Instructed to contact provider for unexplained episode, an episode requiring more than 1 treatment, or 2 episodes within a 2-3 day period, for possible dose adjustment.    Hyperglycemia:  Instructed on hyperglycemia recognition and treatment. Described possible causes and prevention and when to contact provider.   Emergency Medication To Treat Severe Hypoglycemia: Provided Colleen Russo and husband instruction on [facility] medicine using practice kit, both verbalized understanding.    Diabetes Resources: Provided,  Diabetes: Sick Care, Changing Life with Diabetes, Low Blood Sugars/High Blood Sugars and Certified Diabetes Educator Referral, as resources for discharge.      Insurance: Colleen Russo has [place] [employer] [place]. There are not financial barriers to glucose monitoring.      Monitoring: Provided with copies of large print log sheets to maintain a record of readings & instructed to bring to follow-up visits with provider for evaluation of treatment plan.    Recommendations: At f/u visit with PCP request referral for DSMES (Diabetes Self Management Education and Support) after discharge.  Please order Baqsimi at discharge.    Clinical Nurse Teach/Review: Allow patient to give insulin injections, review the types of insulin given, rotation of sites and review hypoglycemia s/s with patient.      Plan: Please reconsult as needed.   Thank You,   Remonia Richter, BSN, RN, [employer], Certified Diabetes Care & Education Specialist  - pager: [phone]

## 2021-09-12 NOTE — Unmapped (Signed)
Gynecology Oncology Physician Discharge Summary    Admit Date: 08/17/2021    Discharge Date: 09/13/21    Discharge to: Home    Discharge Service: Gynecology (GYN)    Discharge Attending Physician: Carmela Hurt, MD    Discharge Diagnoses:   Principal Problem:    Small bowel obstruction (CMS-HCC)  Active Problems:    Seasonal allergies    Cancer related pain    Nausea & vomiting    Ovarian cancer (CMS-HCC)       Procedures: None    Pertinent Test Results:   Lab Results   Component Value Date    WBC 5.0 09/12/2021    HGB 8.0 (L) 09/12/2021    HCT 24.3 (L) 09/12/2021    PLT 186 09/12/2021       Lab Results   Component Value Date    NA 140 09/12/2021    K 3.4 09/12/2021    CL 105 09/12/2021    CO2 30.0 09/12/2021    BUN 22 09/12/2021    CREATININE 0.39 (L) 09/12/2021    GLU 251 (H) 09/12/2021    CALCIUM 8.1 (L) 09/12/2021    MG 1.7 09/12/2021    PHOS 4.2 09/12/2021       Lab Results   Component Value Date    BILITOT 0.2 (L) 09/11/2021    BILIDIR 0.10 09/11/2021    PROT 4.7 (L) 09/11/2021    ALBUMIN 2.0 (L) 09/11/2021    ALT 16 09/11/2021    AST 10 09/11/2021    ALKPHOS 62 09/11/2021       Lab Results   Component Value Date    PT 13.2 (H) 09/10/2021    INR 1.16 09/10/2021    APTT 23.4 (L) 09/10/2021       Hospital Course: Colleen Russo is a 46 y.o. 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 Her hospital course is outlined by the pertinent problems below.    Onc: Recurrent Clear Cell Carcinoma of the Ovary   The patient was first diagnosed in 2020 and underwent radical debulking with adjuvant chemotherapy consisting of carboplatin/taxol then carboplatin/gem. Most recently her CTAP on 08/01/21 was concerning for disease progression. Biopsy from an abdominal mass on 08/02/21 which was initially concerning for a GI cancer resulted as metastatic disease. She plans to pursue further chemotherapy with Dr. Nelly Rout and Pricilla Holm outpatient.     Small bowel obstruction  Patient with extensive gynecologic history and multiple abdominal surgeries with development of an enterocutaneous fistula. She presented to the ER on 08/16/21 with nausea and vomiting, found to have an SBO. She was originally managed medically with NGT decompression and was eventually able to tolerate a regular diet for about a week. However, her symptoms then recurred around hospital day #13 and an NGT was replaced. VIR performed/placed TEG on 2/3 and removed her NGT.     CTAP was ordered to assess decompression and showed no significant change in appearance of complex, possibly closed loop, small bowel obstruction,      Enterocutaneous Fistula - Hx abscesses - Leukocytosis  Patient has history of cancer related enterocutaneous fistula, and abscesses requiring drainage. On admission to the hospital on 1/13 she was initially placed on broad spectrum antibiotics but was taken off on 1/16 when blood and wound cultures were negative. She received wound dressing changes every other day with zinc oxide  barrier and dressing. On 2/5 the patient had several episodes of diarrhea, was tachycardic, and WBC up-trended to 13. CTAP showed multiple new locules of fluid and gas in the subcutaneous soft tissues, which was thought to be a superimposed infection and she was started on Zosyn. She underwent a drain placement with VIR and subcutaneous drainage collection on 2/7. They aspirated 10 mL of purulent  fluid which was sent for culture and grew 2+ gram negative rods and 3+ polymorphonuclear leukocytes. She was transitioned from Zosyn to Levofloxacin and Flagyl until 2/19.     Nausea/Vomiting  Patient was received antiemetics per Palliative which consisted of compazine, Reglan, octreotide, scopolamine patch, and Dexamethasone 4 mg every day with a taper schedule. Her octreotide was transitioned to be included in her TPN .     Pain  With assistance of Palliative care, the patient's pain was controlled on a regimen of 50 mcg fentanyl patches, Tylenol PRN, Roxanol 20 mg q4h PRN, IV morphine PRN, and tizanidine.     Poor Nutrition  Nutrition and Palliative care followed with the patient while admitted. She received olanzapine and was started on TPN (1/28-). Due to elevated blood glucoses, she was managed with NPH 20 units 1 hour prior to starting TPN and a regular insulin sliding scale with POC glucose checks every 6 hours. At the time of discharge, she was continued on NPH and her sliding scale was transitioned to lispro.     Anxiety  Palliative followed with the patient for regular monitoring.     Anemia   There was no acute concern for bleeding. Her anemia was likely due to chronic disease. The patient received 1u PRBC on 1/28 to optimize her hgb prior to chemotherapy.     Thyroid Function  The patient's TSH was 0.237 and her Free T4 was 0.54. Her labs were consistent with Sick Euthyroid Syndrome.     On HD#28 she was deemed stable for discharge. She will see Dr. Pricilla Holm on 09/16/21 for a follow-up visit.       Condition at Discharge: good    Discharge Day Services:  The patient was seen by the primary team, and deemed ready for discharge.  BP 104/69  - Pulse 102  - Temp 37.1 ??C (98.8 ??F) (Oral)  - Resp 18  - Ht 158 cm (5' 2.21)  - Wt 64.5 kg (142 lb 4.8 oz)  - SpO2 98%  - BMI 25.86 kg/m??   Gen: NAD, awake, alert, oriented  HEENT: TEG dressing C/D/I, bilious green drainage  CV: regular rate and rhythm   Pulm: clear to auscultation bilaterally, normal work of breathing  Abd: ????Non-tender, non-distended. Diminished bowel sounds in all 4 quadrants. No rebound or guarding. Midline abdominal mass non tender. Abdominal wound with dressing, clean and dry. Lidocaine patches in place. JP draining milk purulent fluid from left mid abdomen, dressing C/D/I  GU: deferred    Discharge Medications:      Your Medication List      STOP taking these medications amoxicillin-clavulanate 875-125 mg per tablet  Commonly known as: AUGMENTIN     ondansetron 4 MG disintegrating tablet  Commonly known as: ZOFRAN-ODT     ondansetron 8 MG tablet  Commonly known as: ZOFRAN     oxyCODONE 5 mg capsule  Commonly known as: OXY-IR     promethazine 12.5 MG suppository  Commonly known as: PHENERGAN        START taking these medications    bisacodyL 10 mg suppository  Commonly known as: DULCOLAX  Insert 1 suppository (10 mg total) into the rectum daily as needed.     calcium carbonate 200 mg calcium (500 mg) chewable tablet  Commonly known as: TUMS  Chew 1 tablet (200 mg of elem calcium total) Three (3) times a day as needed.     cyclophosphamide 50 mg capsule  Commonly known as: CYTOXAN  Take 1 capsule (50 mg total) by mouth daily . Do not start until discussing with your oncologist  Start taking on: September 24, 2021     fentaNYL 50 mcg/hr patch  Commonly known as: DURAGESIC  Place 1 new patch on the skin every third day (every 72 hours). Remove old patch before applying new one. Please discard old patch after folding sticky ends together.     hydrOXYzine 25 MG tablet  Commonly known as: ATARAX  Take 1 tablet (25 mg total) by mouth every six (6) hours as needed for anxiety for up to 14 days.     insulin lispro 100 unit/mL injection pen  Commonly known as: HumaLOG  Inject 0-0.2 mL (0-20 Units total) under the skin every six (6) hours as needed. Administer number of insulin units based on your blood glucose: BG 160-200 = 1 unit BG 201-240 = 4 units BG 241-280 = 6 units BG 281-300 = 8 units BG > 300 = 10 units and notify provider     insulin NPH 100 unit/mL injection  Commonly known as: HumuLIN N NPH U-100 Insulin  Inject 20 Units under the skin daily. Inject 1 hour prior to starting TPN infusion     levoFLOXacin 750 MG tablet  Commonly known as: LEVAQUIN  Take 1 tablet (750 mg total) by mouth daily for 9 days.  Start taking on: September 13, 2021     lidocaine 4 % patch  Place 1 patch on the skin daily. Apply to affected area for 12 hours only each day (then remove patch).  Start taking on: September 13, 2021     metroNIDAZOLE 500 MG tablet  Commonly known as: FLAGYL  Take 1 tablet (500 mg total) by mouth Three (3) times a day for 10 days.     MORPhine 20 mg/mL concentrated solution  Take 0.25 mL (5 mg total) by mouth every four (4) hours as needed for up to 5 days.     OLANZapine 5 MG tablet  Commonly known as: ZyPREXA  Take 1 tablet (5 mg total) by mouth nightly.     OLANZapine 2.5 MG tablet  Commonly known as: ZyPREXA  Take 1 tablet (2.5 mg total) by mouth daily.  Start taking on: September 13, 2021     senna 8.6 mg tablet  Commonly known as: SENOKOT  Take 2 tablets by mouth two (2) times a day as needed for constipation.     simethicone 80 MG chewable tablet  Commonly known as: MYLICON  Chew 1 tablet (80 mg total) every six (6) hours as needed.     sodium chloride 0.9 % injection  Commonly known as: NS  Use 5 mL by Intra-cannular route daily.     zinc oxide-cod liver oil 40 % Pste  Commonly known as: DESITIN 40%  Apply topically daily.        CHANGE how you take these medications    acetaminophen 325 MG tablet  Commonly known as: TYLENOL  Take 2 tablets (650 mg total) by mouth every six (6) hours as needed.  What changed:   ?? medication strength  ??  how much to take  ?? Another medication with the same name was removed. Continue taking this medication, and follow the directions you see here.     dexAMETHasone 1 MG tablet  Commonly known as: DECADRON  Take 2 tablets (2 mg total) by mouth daily for 3 days, THEN 1 tablet (1 mg total) daily for 7 days.  Start taking on: September 13, 2021  What changed:   ?? medication strength  ?? See the new instructions.        CONTINUE taking these medications    docusate sodium 100 MG capsule  Commonly known as: COLACE  Take 100 mg by mouth daily.     famotidine 20 MG tablet  Commonly known as: PEPCID  Take 20 mg by mouth daily.     metoclopramide 10 MG tablet  Commonly known as: REGLAN  Take 10 mg by mouth every eight (8) hours.     multivitamin therapeutic with minerals 27-0.4 mg Tab  Commonly known as: THERA-M  Take 1 tablet by mouth daily.     omeprazole 40 MG capsule  Commonly known as: PriLOSEC  Take 40 mg by mouth daily.     polyethylene glycol 17 gram/dose powder  Commonly known as: GLYCOLAX  Take 17 g by mouth daily.     prochlorperazine 10 MG tablet  Commonly known as: COMPAZINE  Take 10 mg by mouth every eight (8) hours as needed.     scopolamine 1 mg over 3 days  Commonly known as: TRANSDERM-SCOP  Place 1 patch on the skin every third day.            Pending Test Results:   Pending Labs     Order Current Status    Aerobic/Anaerobic Culture Preliminary result          Discharge Instructions:   Activity Instructions     Activity as tolerated          Other Instructions     Call MD for:  difficulty breathing, headache or visual disturbances      Call MD for:  persistent nausea or vomiting      Call MD for:  redness, tenderness, or signs of infection (pain, swelling, redness, odor or green/yellow discharge around incision site)      Call MD for:  severe uncontrolled pain      Call MD for:  temperature >38.5 Celsius      Call MD for:  vaginal bleeding saturating more than 1 pad per hour.      Discharge instructions      DISCHARGE INSTRUCTIONS    You are being discharged on insulin, it is likely that as you continue with your steroid taper your blood glucose levels will likely start to downtrend. Please keep a log. If you start to notice persistent blood glucose levels <80 contact your chemo nurse [phone]).     When to Contact us  - Contact MD if you develop temperature >100.4, unresolvable pain, frequent vomiting, or heavy bleeding.   - If you have any questions or concerns, call Clinic at [phone] during normal business hours (Mon-Fri 8-5) and/or call hospital operator [phone] and ask for the GYN On-Call Consult pager for OB/GYN resident on-call.    Follow up  You follow up with Dr. Pricilla Holm on 2/13.  The [facility] clinic will contact you within the next few days.  However, if you do not hear from them within 1 week, please call [phone] and arrange your appointment.  Follow Up instructions and Outpatient Referrals     Referral to outpatient physical therapy      Suggest Treatment: Evaluation with suggestions for treatment    Call MD for:  difficulty breathing, headache or visual disturbances      Call MD for:  persistent nausea or vomiting      Call MD for:  redness, tenderness, or signs of infection (pain, swelling,   redness, odor or green/yellow discharge around incision site)      Call MD for:  severe uncontrolled pain      Call MD for:  temperature >38.5 Celsius      Call MD for:  vaginal bleeding saturating more than 1 pad per hour.      Discharge instructions      Referral to Home Infusion      Performing location?: External    Home Health Requested Disciplines: Nursing     **Please contact your service pharmacist for assistance with discharge   home health infusion monitoring.      Ambulatory referral to Palliative Care        Appointments which have been scheduled for you    Sep 30, 2021 12:30 PM  (Arrive by 12:00 PM)  NURSE LAB DRAW with ADULT ONC LAB  [facility] ADULT ONCOLOGY LAB DRAW STATION [place] [facility] REGION) [address]  [place] [address]  [phone]      Sep 30, 2021  1:30 PM  (Arrive by 1:00 PM)  LEVEL 120 with [employer] CHAIR 49  [place] ONCOLOGY INFUSION [place] [facility] REGION) [address]  [place] [address]  [phone]      Oct 03, 2021 12:30 PM  (Arrive by 12:00 PM)  NURSE LAB DRAW with ADULT ONC LAB  [facility] ADULT ONCOLOGY LAB DRAW STATION [place] [facility] REGION) [address]  [place] [address]  [phone]      Oct 03, 2021  1:30 PM  (Arrive by 1:00 PM)  LEVEL 120 with [employer] CHAIR 49  [place] ONCOLOGY INFUSION [place] [facility] REGION) [address]  [place] [address]  [phone]      Oct 24, 2021 12:30 PM  (Arrive by 12:00 PM)  NURSE LAB DRAW with ADULT ONC LAB  [facility] ADULT ONCOLOGY LAB DRAW STATION [place] [facility] REGION) [address]  [place] [address]  [phone]      Oct 24, 2021  1:30 PM  (Arrive by 1:00 PM)  LEVEL 120 with [employer] CHAIR 49  [place] ONCOLOGY INFUSION [facility] REGION) [address]  [place] [address]  [phone]      Nov 14, 2021 12:30 PM  (Arrive by 12:00 PM)  NURSE LAB DRAW with ADULT ONC LAB  [facility] ADULT ONCOLOGY LAB DRAW STATION [place] [facility] REGION) [address]  [place] [address]  [phone]      Nov 14, 2021  1:30 PM  (Arrive by 1:00 PM)  LEVEL 120 with [employer] CHAIR 49  [place] ONCOLOGY INFUSION [place] [facility] REGION) [address]  [place] [address]  [phone]      Dec 05, 2021 12:30 PM  (Arrive by 12:00 PM)  NURSE LAB DRAW with ADULT ONC LAB  [facility] ADULT ONCOLOGY LAB DRAW STATION [place] [facility] REGION) [address]  [place] [address]  [phone]  Dec 05, 2021  1:30 PM  (Arrive by 1:00 PM)  LEVEL 120 with [employer] CHAIR 49  [place] ONCOLOGY INFUSION [facility] REGION) [address]  [place] [address]  [phone]      Dec 26, 2021 12:30 PM  (Arrive by 12:00 PM)  NURSE LAB DRAW with ADULT ONC LAB  [facility] ADULT ONCOLOGY LAB DRAW STATION [place] [facility] REGION) [address]  [place] [address]  [phone]      Dec 26, 2021  1:30 PM  (Arrive by 1:00 PM)  LEVEL 120 with [employer] CHAIR 49  [place] ONCOLOGY INFUSION [facility] REGION) [address]  [place] [address]  [phone]           Scribe's Attestation: Warden Fillers, MD obtained and performed the history, physical exam and medical decision making elements that were  entered into the chart. Documentation assistance was provided by me personally, a scribe. Signed by Freddie Apley Scribe, on September 12, 2021 at 9:40 AM.       ----------------------------------------------------------------------------------------------------------------------  September 12, 2021 2:02 PM. Documentation assistance provided by the Scribe. I was present during the time the encounter was recorded. The information recorded by the Scribe was done at my direction and has been reviewed and validated by me.  ----------------------------------------------------------------------------------------------------------------------

## 2021-09-13 ENCOUNTER — Other Ambulatory Visit (HOSPITAL_COMMUNITY): Payer: Self-pay

## 2021-09-13 MED ORDER — PANTOPRAZOLE 40 MG TABLET,DELAYED RELEASE
ORAL_TABLET | Freq: Every day | ORAL | 0 refills | 30 days | Status: CN
Start: 2021-09-13 — End: 2021-10-13

## 2021-09-13 MED ORDER — FENTANYL 50 MCG/HR TRANSDERMAL PATCH
MEDICATED_PATCH | TRANSDERMAL | 0 refills | 30.00000 days | Status: CP
Start: 2021-09-13 — End: 2021-09-13
  Filled 2021-09-14: qty 10, 30d supply, fill #0

## 2021-09-13 MED ORDER — DEXAMETHASONE 2 MG TABLET
ORAL_TABLET | Freq: Every day | ORAL | 0 refills | 3.00000 days | Status: CP
Start: 2021-09-13 — End: 2021-09-12

## 2021-09-13 MED ORDER — LIDOCAINE 4 % TOPICAL PATCH
MEDICATED_PATCH | Freq: Every day | TRANSDERMAL | 0 refills | 30.00000 days | Status: CP
Start: 2021-09-13 — End: 2021-10-13
  Filled 2021-09-12: qty 10, 10d supply, fill #0

## 2021-09-13 MED ORDER — DEXAMETHASONE 1 MG TABLET
ORAL_TABLET | ORAL | 0 refills | 10 days | Status: CP
Start: 2021-09-13 — End: 2021-09-23
  Filled 2021-09-12: qty 13, 10d supply, fill #0

## 2021-09-13 MED ORDER — LEVOFLOXACIN 750 MG TABLET
ORAL_TABLET | Freq: Every day | ORAL | 0 refills | 9.00000 days | Status: CP
Start: 2021-09-13 — End: 2021-09-22
  Filled 2021-09-12: qty 9, 9d supply, fill #0

## 2021-09-13 MED ORDER — OLANZAPINE 2.5 MG TABLET
ORAL_TABLET | Freq: Every day | ORAL | 0 refills | 30.00000 days | Status: CP
Start: 2021-09-13 — End: 2021-10-13
  Filled 2021-09-12 (×2): qty 30, 30d supply, fill #0

## 2021-09-13 NOTE — Unmapped (Signed)
Hi,     Colleen Russo contacted the [employer] requesting to speak with the care team of Colleen Russo to discuss:    Assistance with morning and night medications.   Please contact Colleen Russo at [phone]      Thank you,   Yolanda Bonine  [facility] Communication Center   [phone]

## 2021-09-13 NOTE — Unmapped (Signed)
Return call to patient regarding message received of needing assistance with medications.  Patient is confused about her home medications and unsure what she should be taking.  Reviewed the instructions off of her AVS from discharge yesterday and went over each medication as she found the bottle that coincided.  Patient seemed clear regarding instructions after review.  Also discussed upcoming infusion appointments for [place] and pre infusion labs.  Patient prefers to have same day labs at [facility] on day of infusion.  Encouraged patient to reach out if she had further questions or concerns and she verbalizes understanding.

## 2021-09-13 NOTE — Unmapped (Signed)
Patient provided with discharge instructions with all questions answered. PICC line dressing changed before departure, patient discharged home with PICC line in place. Patient left in wheelchair with husband. Medications delivered to patients bedside by pharmacy before departure.   Problem: Adult Inpatient Plan of Care  Goal: Plan of Care Review  Outcome: Resolved  Goal: Patient-Specific Goal (Individualized)  Outcome: Resolved  Goal: Absence of Hospital-Acquired Illness or Injury  Outcome: Resolved  Intervention: Identify and Manage Fall Risk  Recent Flowsheet Documentation  Taken 09/12/2021 0710 by Trenton Gammon, RN  Safety Interventions:   fall reduction program maintained   low bed   nonskid shoes/slippers when out of bed  Intervention: Prevent Infection  Recent Flowsheet Documentation  Taken 09/12/2021 0710 by Trenton Gammon, RN  Infection Prevention:   cohorting utilized   hand hygiene promoted   personal protective equipment utilized  Goal: Optimal Comfort and Wellbeing  Outcome: Resolved  Goal: Readiness for Transition of Care  Outcome: Resolved  Goal: Rounds/Family Conference  Outcome: Resolved     Problem: Fall Injury Risk  Goal: Absence of Fall and Fall-Related Injury  Outcome: Resolved  Intervention: Promote Injury-Free Environment  Recent Flowsheet Documentation  Taken 09/12/2021 0710 by Trenton Gammon, RN  Safety Interventions:   fall reduction program maintained   low bed   nonskid shoes/slippers when out of bed     Problem: Pain Acute  Goal: Acceptable Pain Control and Functional Ability  Outcome: Resolved     Problem: Pain Chronic (Persistent) (Comorbidity Management)  Goal: Acceptable Pain Control and Functional Ability  Outcome: Resolved

## 2021-09-14 ENCOUNTER — Other Ambulatory Visit (HOSPITAL_COMMUNITY): Payer: Self-pay

## 2021-09-14 MED ORDER — FENTANYL 50 MCG/HR TD PT72
MEDICATED_PATCH | TRANSDERMAL | 0 refills | Status: AC
  Filled 2021-09-14: qty 10, 30d supply, fill #0

## 2021-09-14 NOTE — Unmapped (Addendum)
GYN Telephone Note    Patient discharged 02/09 from GYN ONC service on a regimen of 20u NPH nightly and Humalog SSI. This morning she woke up and glucose was 305 and she took 10u insulin. Recheck at noon after eating a ginger candy was 363. She rchecked 25 minutes later and was 385. She states she took her long acting insulin NPH last night, 20 units. She otherwise feels well, denies SOB/fatigue/n/v/d.     Unfortunately she has been taking only her long acting insulin, including as her sliding scale today. She has not used her Humalog.     After speaking with on call ONC fellow, instructed to administer 10 units of her SHORT acting insulin. We will call her again shortly with a longer term plan for her insulin regimen.     Patient given precautions to present to ED if at any time she felt unwell, shaky, SOB, extremely thirsty, or signs of frequent urination.     Saintclair Halsted MD  OBGYN      ADDENDUM  After discussion with fellow, instructed patient on new plan for this weekend.     - Continue to check sugar q4-6 hours today (including throughout the night) and use the SHORT acting insulin as sliding scale  - Do NOT take LONG acting tonight  - Continue to take SHORT acting as sliding scale q4-6 tomorrow  - Tomorrow night take 24 units of LONG acting   - Follow up with Dr. Pricilla Holm on Monday    If at any point she feels unwell, as described above, instructed to go to ED. Patient has glucose forms nearby such as juice/candies/tablets.     Saintclair Halsted MD

## 2021-09-14 NOTE — Unmapped (Signed)
This record was created as a result of user error.  Please disregard.  Pt.  Trying to get gynecology/oncology provider on call.  Pt. See's Dr. Nelly Rout with gyn/onc. Transferred pt.  back to [place] hospital operator to page gyn-onc on call.

## 2021-09-14 NOTE — Unmapped (Signed)
Regarding: Rx needs to called in by doctor, wont refil fentanyl patch  ----- Message from Jorge Mandril, MT-BC  sent at 09/13/2021  5:30 PM EST -----  If you had not called [facility], what do you think you would have done?   Make Appointment / Call Office

## 2021-09-14 NOTE — Unmapped (Signed)
Patient called for initial fill of fentanyl patch. Records show that it was sent to [facility] outpatient [facility]. Patient provided number to check that closing time was 8pm.

## 2021-09-14 NOTE — Unmapped (Signed)
Hi,    Patient Colleen Russo called requesting a medication refill for the following:    ??? Medication: Fentanyl   ??? Dosage: Patch   ??? Days left of medication: 0  Pharmacy:[facility] - [place], [place] - [address]   [address] [place], [place] [address]   Phone:  [phone] ??Fax:  [phone]    The expected turnaround time is 3-4 business days       Check Indicates criteria has been reviewed and confirmed with the patient:    [x]  Preferred Name   [x]  DOB and/or MR#  [x]  Preferred Contact Method  [x]  Phone Number(s)   [x]  Preferred Pharmacy   [x]  MyChart     Thank you,  Iona Hansen  [facility] Cancer Communication Center  [phone]

## 2021-09-15 NOTE — Unmapped (Signed)
Called patient to review plan after she erroneously administered excess insulin earlier today while she was hyperglycemic with blood sugars > 300. This morning at 6:00 she administered 10 units of NPH when her fasting blood glucose was 305. She rechecked at 9:30 and noted that her blood glucose was 363 for which she gave herself another 10 units. At 1:30, she treated a blood glucose of 385 with 10 units of lispro x2. She should primarily only have the insulin from her lispro and 9:30 NPH on board currently with both having passed their peak effects. She will be starting her TPN around 8:30. I had her check her blood sugar and it was 71. She is administering her glucose tablet and will check again in 1 hour. To manage the errors in insulin administration that occurred earlier today with the plan to favor on the side of hyperglycemia, she will not administer any NPH tonight and will rather manage with just a sliding scale as previously outlined. She feels comfortable with this plan for which she was able to repeat back to me. Will plan to resume her regularly scheduled insulin regimen tomorrow night with 20 units of NPH. If she is to have two or more hypoglycemic checks, she is to notify us and will need to be admitted.

## 2021-09-15 NOTE — Unmapped (Signed)
Called to follow up on patient. She reports feeling well overnight and throughout the day today. Blood glucose at 5:55 this AM, 327 for which she treated with 10 units of lispro. At 9:00, blood glucose 256 and was treated with 6 units of lispro. Most recent check at 3:00 was 99. She will go back to her previously scheduled regimen that was prescribed for her at discharge with 20 units NPH tonight and sliding scale lispro as needed.

## 2021-09-16 ENCOUNTER — Inpatient Hospital Stay: Payer: Medicaid Other | Attending: Gynecologic Oncology | Admitting: Gynecologic Oncology

## 2021-09-16 ENCOUNTER — Ambulatory Visit (HOSPITAL_COMMUNITY): Payer: Medicaid Other

## 2021-09-16 ENCOUNTER — Other Ambulatory Visit: Payer: Self-pay

## 2021-09-16 VITALS — BP 104/77 | HR 103 | Temp 98.3°F | Resp 18 | Ht 61.81 in | Wt 146.6 lb

## 2021-09-16 DIAGNOSIS — Z794 Long term (current) use of insulin: Secondary | ICD-10-CM | POA: Diagnosis not present

## 2021-09-16 DIAGNOSIS — Z90710 Acquired absence of both cervix and uterus: Secondary | ICD-10-CM | POA: Diagnosis not present

## 2021-09-16 DIAGNOSIS — Z8616 Personal history of COVID-19: Secondary | ICD-10-CM | POA: Diagnosis not present

## 2021-09-16 DIAGNOSIS — K56600 Partial intestinal obstruction, unspecified as to cause: Secondary | ICD-10-CM

## 2021-09-16 DIAGNOSIS — E099 Drug or chemical induced diabetes mellitus without complications: Secondary | ICD-10-CM | POA: Insufficient documentation

## 2021-09-16 DIAGNOSIS — Z79891 Long term (current) use of opiate analgesic: Secondary | ICD-10-CM | POA: Insufficient documentation

## 2021-09-16 DIAGNOSIS — Z931 Gastrostomy status: Secondary | ICD-10-CM | POA: Insufficient documentation

## 2021-09-16 DIAGNOSIS — R222 Localized swelling, mass and lump, trunk: Secondary | ICD-10-CM

## 2021-09-16 DIAGNOSIS — Z90722 Acquired absence of ovaries, bilateral: Secondary | ICD-10-CM | POA: Insufficient documentation

## 2021-09-16 DIAGNOSIS — C563 Malignant neoplasm of bilateral ovaries: Secondary | ICD-10-CM

## 2021-09-16 DIAGNOSIS — R627 Adult failure to thrive: Secondary | ICD-10-CM

## 2021-09-16 DIAGNOSIS — K632 Fistula of intestine: Secondary | ICD-10-CM

## 2021-09-16 NOTE — Patient Instructions (Signed)
It was good to see you today.  I am glad you are feeling better.  Please let me know if you need anything.  The office is scheduling you a follow-up visit with me in 3 months.  You will see Dr. Skeet Latch regularly while you are on treatment.    I suspect at your next visit, that the drain will be removed.

## 2021-09-16 NOTE — Progress Notes (Signed)
Sarah Newman, Sarah Newman uterus. I suspect this mass is adnexal/ovarian in origin rather than uterine in origin. Specifically, I suspect Newman mass likely arises from Newman left ovary/adnexa and is most consistent with a neoplasm. Malignancy is certainly not excluded on this study. Recommend a pelvic ultrasound for further evaluation. 2. Newman rounded more solid-appearing structure in Newman right side of Newman pelvis is probably Newman right ovary. Recommend attention on ultrasound. 3. Newman small amount of fluid in Newman pelvis is likely reactive to Newman complex cystic mass likely rising from Newman left ovary/adnexa. 4. No cause for blood in vomit or black stools identified. No convincing evidence of a perforated or inflamed gastric or duodenal ulcer. 5. Hepatic steatosis.   07/02/2019 Imaging   MRI pelvis 1. There is a large (15 cm) solid and cystic mass which appears to be arising from Newman left ovary concerning for cystic ovarian neoplasm. There are 2 enhancing nodules within Newman central upper abdomen raising Newman possibility of peritoneal carcinomatosis. Additionally, there is a small amount fluid within Newman pelvis. Malignant ascites not excluded. 2. Fibroid uterus. 3. Hepatic steatosis.   07/02/2019 Imaging   US pelvis 1. There is a large mass in Newman pelvis also seen on CT imaging. A separate left ovary is not visualized. I suspect Newman large mass represents a large neoplasm arising from Newman left ovary. Newman mass is suspicious for malignancy. Recommend Sarah consultation. 2. Newman right ovary  demonstrates an irregular ill-defined hypoechoic hypervascular region centrally. Newman findings are nonspecific in Newman right ovary. However, given Newman apparent left ovarian neoplasm suspicious for malignancy, recommend an MRI to further assess Newman right ovary. 3. Uterine fibroid measuring 3.6 cm.     07/21/2019 Pathology Results   FINAL MICROSCOPIC DIAGNOSIS: A. 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 - See oncology table. B. OMENTUM, RESECTION: - Omental lymph node with metastatic carcinoma (1/1). - Omental adipose tissue with foci of inflammation and reactive mesothelial changes. C. FALLOPIAN TUBE, LEFT, RESECTION: - Fallopian tube with focal, mild inflammation and fibrosis. - No tumor identified. D. UTERUS, CERVIX, RIGHT TUBE AND OVARY: - Cervix Nabothian cyst and squamous metaplasia. - Endometrium Proliferative. No hyperplasia or carcinoma. - Myometrium Leiomyomata. No malignancy. -Right ovary Poorly differentiated carcinoma, 4.8 cm. See oncology table and comment. -Right Fallopian tube Benign paratubal cyst. No endometriosis or malignancy. ONCOLOGY TABLE: OVARY or FALLOPIAN TUBE or PRIMARY PERITONEUM: Procedure: Bilateral f-oophorectomy, hysterectomy and omentectomy. Specimen Integrity: Intact Tumor Site: Left ovary and right ovary. See comment. Ovarian Surface Involvement: Not identified. Fallopian Tube Surface Involvement: Not identified. Tumor Size: Left ovary: 18.6 x 16.0 x 7.2 cm. Right ovary: 4.8 x 3.2 x 3.2 cm. Histologic Type: Clear cell carcinoma. See comment. Histologic Grade: High-grade. Other Tissue/ Organ Involvement: Omental lymph node with metastatic carcinoma. Peritoneal/Ascitic Fluid: Pending. Treatment Effect: No known presurgical therapy. Pathologic Stage Classification (pTNM, AJCC 8th Edition): pT3a, pN1a. Representative Tumor Block: A2, A3, A4, A5 A6, A7 and A8. Comment(s): Newman left ovarian tumor is 18.6 cm  in greatest dimension and has features of clear cell carcinoma. Newman right ovarian  tumor is 4.8 cm in greatest dimension and is poorly differentiated with focal features consistent with clear cell carcinoma. Newman pattern of involvement suggests that Newman right ovarian tumor is likely metastatic from Newman larger left ovarian clear cell carcinoma. Sections of Newman omentum show a lymph node with metastatic carcinoma. Sections of Newman remainder of Newman omentum do not show involvement by carcinoma.   07/21/2019 Surgery   Procedure(s) Performed: Exploratory laparotomy with radical tumor debulking including total hysterectomy, bilateral salpingo-oophorectomy, infra-gastric omentectomy, and washings.   Surgeon: Jeral Pinch, MD    Operative Findings: On EUA, mobile uterus and ovarian mass, situated out of Newman pelvis, spanning 6-8 cm above Newman umbilicus.  On intra-abdominal entry, inflamed omentum adherent to much of Newman 16 cm left ovarian mass, adhesions easily lysed bluntly.  No nodularity of Newman ovarian mass however on delivery of Newman mass through Newman Newman incision, there was Intra-Op rupture of one of Newman smaller cystic components with drainage of clear brown-tinged fluid.  Grossly normal-appearing left fallopian tube as well as right tube.  Right ovary mildly enlarged measuring approximately 4 cm and firm/nodular, suspicious for tumor involvement.  Uterus 8-10 cm with 4 cm anterior mid body fibroid.  Some very small volume miliary disease along right pelvic sidewall, removed with uterine specimen.  Evidence of small diverticular disease.  Small bowel normal appearing although multiple loops of bowel adherent to each other, especially by mesentery, and an inflammatory manner.  An approximately 2 x 2 centimeter nodule suspicious for tumor implant was found in Newman omentum just inferior to Newman greater curvature of Newman stomach.  Additional small (less than 5 mm) implants noted on Newman stomach and posterior aspect of  Newman left lobe of Newman liver.  Otherwise Newman liver surface and diaphragm were smooth.   08/03/2019 Cancer Staging   Staging form: Ovary, Fallopian Tube, and Primary Peritoneal Carcinoma, AJCC 8th Edition - Pathologic: Stage IIIA2 (pT3a, pN1, cM0) - Signed by Heath Lark, MD on 08/03/2019    08/12/2019 Procedure   Placement of single lumen port a cath via right internal jugular vein. Newman catheter tip lies at Newman cavo-atrial junction. A power injectable port a cath was placed and is ready for immediate use.   08/22/2019 - 12/05/2019 Chemotherapy   Newman patient had carboplatin and taxol for chemotherapy treatment.     09/23/2019 Genetic Testing   Negative genetic testing:  No pathogenic variants detected on Newman [facility] TumorNext-HRD+CancerNext paired germline and somatic genetic test. Newman report date is 09/23/2019.  Newman TumorNext-HRD+CancerNext test offered by [facility] includes paired germline and tumor analyses of 11 genes associated with homologous recombination repair (ATM, BARD1, BRIP1, CHEK2, MRE11A, NBN, PALB2, RAD51C, RAD51D, BRCA1, BRCA2) plus germline analyses of 26 additional genes associated with hereditary cancer (APC, AXIN2, BMPR1A, CDH1, CDK4, CDKN2A, DICER1, HOXB13, EPCAM, GREM1, MLH1, MSH2, MSH3, MSH6, MUTYH, NF1, NTHL1, PMS2, POLD1, POLE, PTEN, RECQL, SMAD4, SMARCA4, STK11, and TP53).   11/14/2019 Tumor Marker   Patient's tumor was tested for Newman following markers: CA-125 Results of Newman tumor marker test revealed 10.2.   12/05/2019 Tumor Marker   Patient's tumor was tested for Newman following markers: CA-125 Results of Newman tumor marker test revealed 9.9   01/05/2020 Imaging   No specific findings of active malignancy. Interval resection of Newman pelvic mass. Subtle nodularity along Newman right side of Newman vaginal cuff merits surveillance.     01/05/2020 Tumor Marker   Patient's tumor was tested for Newman following markers: CA-125 Results of Newman  tumor marker test revealed 9.7   02/17/2020 Tumor  Marker   Patient's tumor was tested for Newman following markers: CA-125 Results of Newman tumor marker test revealed 9.6   04/05/2020 Imaging   1. There is some minimal stranding along Newman mesentery and omentum without overt omental caking or well-defined nodularity. Faint bandlike density along Newman anterior peritoneal reflection in Newman pelvis, slightly more notable than on prior. Overall Newman appearance is not considered specific for recurrence but given Newman slight increase in prominence from 01/05/2020, would encourage careful surveillance by tumor markers and imaging. 2. Mild lower lumbar degenerative facet arthropathy.   04/05/2020 Tumor Marker   Patient's tumor was tested for Newman following markers: CA-125 Results of Newman tumor marker test revealed 11   05/29/2020 Tumor Marker   Patient's tumor was tested for Newman following markers: CA-125. Results of Newman tumor marker test revealed 17.   07/04/2020 Tumor Marker   Patient's tumor was tested for Newman following markers: CA-125. Results of Newman tumor marker test revealed 20.1   07/16/2020 Imaging   Stable mild stranding throughout mesenteric and omental fat, without evidence of discrete masses or ascites. No evidence of new or progressive disease.   08/29/2020 Tumor Marker   Patient's tumor was tested for Newman following markers: CA-125 Results of Newman tumor marker test revealed 32.7.   09/07/2020 Imaging   1. Interval progression of peritoneal disease within Newman abdomen and pelvis. Tumor predominantly involves Newman serosal surface of Newman large and small bowel loops. No signs of bowel obstruction identified at this time. 2. No evidence for metastatic disease to Newman chest.   09/19/2020 Imaging   1. No acute findings identified within Newman abdomen or pelvis. No evidence for bowel obstruction. 2. Similar appearance of soft tissue thickening along Newman peritoneal reflections within Newman abdomen and pelvis compatible with known peritoneal disease. Recently  characterized increased soft tissue along Newman surface of Newman jejunal bowel loops and tethering of pelvic small bowel loops is difficult to quantify (and compared with previous exam) due to lack of IV contrast material.     09/20/2020 - 12/25/2020 Chemotherapy    Patient is on Treatment Plan: OVARIAN RECURRENT 3RD LINE CARBOPLATIN D1 / GEMCITABINE D1,8 (4/800) Q21D  Carboplatin was discontinued after 10/15/2020 due to allergic reaction      10/01/2020 Tumor Marker   Patient's tumor was tested for Newman following markers: CA-125. Results of Newman tumor marker test revealed 37.2.   10/15/2020 Tumor Marker   Patient's tumor was tested for Newman following markers: CA-125. Results of Newman tumor marker test revealed 48   10/22/2020 Tumor Marker   Patient's tumor was tested for Newman following markers: CA-125 Results of Newman tumor marker test revealed 28.5   11/05/2020 Tumor Marker   Patient's tumor was tested for Newman following markers: CA-125 Results of Newman tumor marker test revealed 18.9   12/17/2020 Imaging   1. No significant change in soft tissue thickening and nodularity in Newman low pelvis, with minimal thickening of Newman peritoneum throughout Newman abdomen. 2. Similar appearance of soft tissue thickening about Newman mid small bowel in Newman central, ventral abdomen. 3. Findings are consistent with stable peritoneal metastatic disease. 4. Status post hysterectomy and cholecystectomy.   12/18/2020 Tumor Marker   Patient's tumor was tested for Newman following markers: CA-125 Results of Newman tumor marker test revealed 10.8   01/11/2021 Imaging   Findings concerning for small bowel obstruction.     01/11/2021 Tumor Marker   Patient's tumor  was tested for Newman following markers: CA-125. Results of Newman tumor marker test revealed 10.4.   01/11/2021 Imaging   CT abdomen and pelvis Mildly prominent proximal loops of small bowel although no true transition zone is seen in Newman distal small bowel appears within normal  limits. These changes could represent early partial small bowel obstruction. Newman overall appearance however is similar to that seen on prior CT from May of 2022.   Mild peritoneal thickening similar to that seen on Newman prior exam.   Fatty liver.   No other focal abnormality is noted.   05/17/2021 Imaging   1. Successful drainage of previously demonstrated air-collection in Newman anterior abdominal wall following pigtail catheter placement. 2. No well-defined residual enterocutaneous fistula identified, although there is faintly increased density within Newman anterior abdominal wall deep to Newman catheter which could reflect enhancing granulation tissue or a residual small fistula. Correlate with output from Newman drain. 3. No evidence of bowel obstruction or extraluminal intra-abdominal fluid or air collections. 4. Stable mild biliary dilatation post cholecystectomy.   07/08/2021 Imaging   1. No significant change in appearance of Newman soft tissue nodularity in Newman anterior abdominal wall in Newman region of Newman prior fistulous tract, no new walled off fluid collection. 2. Similar appearance of Newman mixed density nodular area in Newman anterior abdomen, possibly reflecting loops of bowel, scarring and ascites however this area is difficult to delineate given Newman lack of oral contrast material. No intra abdominopelvic walled off fluid collection. 3. Increased small volume of abdominopelvic free fluid with similar peritoneal thickening. 4. Similar wall thickening of Newman prominent gas and fluid-filled loops of small bowel in Newman right hemiabdomen without evidence of high-grade obstruction.   08/01/2021 - 08/07/2021 Hospital Admission   She was [facility] to [facility] for management of subacute bowel obstruction   08/02/2021 Pathology Results   A. PERIUMBILICAL COMPLEX MASS, Newman, BIOPSY:  - Metastatic poorly differentiated carcinoma, see comment   COMMENT:   Immunohistochemical stains show that Newman tumor  cells are positive for CK7 and CDX2 (weak to moderate) while they are negative for CK20, PAX8, ER and GATA3.  This immunoprofile is most suggestive of an upper gastrointestinal or pancreatobiliary primary.  Clinical and radiologic correlation is suggested.     08/06/2021 Imaging   CT enterography  1. Interval mild increase in ascites compared with prior CT of 5 days ago. No focal extraluminal air or fluid collections are identified. 2. Persistent small bowel wall thickening and enhancement in Newman anterior abdomen with upstream small bowel dilatation suggesting residual partial small bowel obstruction. 3. Anterior abdominal wall fluid collections have decreased in size. 4. No significant solid visceral organ or vascular abnormalities identified.     Interval History: Patient reports doing well since being discharged home.  She is on TPN which is cycling at night from 8:30 PM to 10:30 AM.  She has gained 2 pounds since discharge.  She is having a bowel movement every 2 to 3 days which she describes as having "sediment", denies any diarrhea.  Every time she has a bowel movement, she has some milky and bloody discharge from a pinpoint hole over her abdominal fluid collection.  Newman JP drain, despite flushing it twice a day, has had minimal output since discharge.  Patient continues on antibiotics.  From a pain standpoint, she is doing very well.  She is using Newman fentanyl patch as well as lidocaine patches with good relief.  She is not really needing any  short acting medications.  She denies any nausea or emesis.  Denies any fevers or chills.  She is scheduled to be seen at [facility] on [address] for treatment.  Past Medical/Surgical History: Past Medical History:  Diagnosis Date   Anemia due to antineoplastic chemotherapy    Arthritis    Asthma due to seasonal allergies    01-21-2021 per pt seasonal and exercised induced, does not have inhaler, last used one few years   Chronic abdominal pain 01/2021    Chronic nausea    01-21-2021  per pt intermittant nausea and when has abd pain most of time will have vomiting   Family history of breast cancer    Family history of colon cancer    Family history of ovarian cancer    Family history of prostate cancer    History of 2019 novel coronavirus disease (COVID-19) 09/08/2020   positive results in epic, per pt mild symptoms that resolved   History of Helicobacter pylori infection 07/2016   E[facility] w/ bx's by [facility],  chronic h.pylori w/ gastritis,  treated  (no ulcer)   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;  progression w/ abdominal carcinomatosis   On total parenteral nutrition (TPN) 02/02/2021   infused thru [place] in place    Steroid-induced diabetes ([place])    followed by dr Alvy Bimler    Past Surgical History:  Procedure Laterality Date   CHOLECYSTECTOMY N/A 02/16/2015   Procedure: LAPAROSCOPIC CHOLECYSTECTOMY WITH INTRAOPERATIVE CHOLANGIOGRAM;  Surgeon: Johnathan Hausen, MD;  Location: [facility];  Service: General;  Laterality: N/A;   INCISION AND DRAINAGE ABSCESS N/A 02/02/2021   Procedure: INCISION AND DRAINAGE ABSCESS;  Surgeon: Lahoma Crocker, MD;  Location: [facility];  Service: Gynecology;  Laterality: N/A;   IR IMAGING GUIDED PORT INSERTION  08/12/2019   IR REMOVAL TUN ACCESS W/ PORT W/O FL MOD SED  03/18/2021   LAPAROSCOPY N/A 01/23/2021   Procedure: LAPAROSCOPY DIAGNOSTIC;  Surgeon: Lafonda Mosses, MD;  Location[facility];  Service: Gynecology;  Laterality: N/A;   LAPAROTOMY N/A 01/23/2021   Procedure: LAPAROTOMY, ENTEROTOMY REPAIR OF Newman BOWEL;  Surgeon: Lafonda Mosses, MD;  Location: [facility];  Service: Gynecology;  Laterality: N/A;   LAPAROTOMY N/A 05/03/2021   Procedure: incision and drainage abdominal collection and placement drainage catheter;   Surgeon: Lafonda Mosses, MD;  Location: [facility];  Service: Gynecology;  Laterality: N/A;   SALPINGOOPHORECTOMY N/A 07/21/2019   Procedure: EXPLORATORY LAPAROTOMY, TOTAL ABDOMINAL HYSTERECTOMY, BILATERAL SALPINGO OOPHORECTOMY, OMENTECTOMY;  Surgeon: Lafonda Mosses, MD;  Location: [facility];  Service: Gynecology;  Laterality: N/A;   UPPER GASTROINTESTINAL ENDOSCOPY  08/01/2016   by dr Corinda Gubler in [place] N/A 03/11/2021   Procedure: OPENING AND EXPLORATION OF ABDOMINAL EXCISION;  Surgeon: Lafonda Mosses, MD;  Location: [facility];  Service: Gynecology;  Laterality: N/A;    Family History  Problem Relation Age of Onset   Colon cancer Father 79   Prostate cancer Father 34   Colon cancer Paternal Aunt        dx. early 6s   Ovarian cancer Paternal Grandmother 71   Breast cancer Cousin    Endometriosis Mother    Cancer Paternal Uncle        unknown type, dx. early 40s   Cancer Paternal Uncle 41  unknown type   Esophageal cancer Neg Hx     Social History   Socioeconomic History   Marital status: Married    Spouse name: Herbie [place]   Number of children: 2   Years of education: Not on file   Highest education level: Not on file  Occupational History   Not on file  Tobacco Use   Smoking status: Never   Smokeless tobacco: Never  Vaping Use   Vaping Use: Never used  Substance and Sexual Activity   Alcohol use: Not Currently    Comment: Social drinker   Drug use: Never   Sexual activity: Yes    Birth control/protection: Surgical  Other Topics Concern   Not on file  Social History Narrative   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  Newly married as of October 2020.   2 boys, age 56 and 27   Social Determinants of Health   Emergency planning/management officer Strain: Not on file  Food Insecurity: Not on file  Transportation Needs: Not on file  Physical Activity: Not on file  Stress: Not on file  Social Connections: Not on file    Current  Medications:  Current Outpatient Medications:    acetaminophen (TYLENOL) 500 MG tablet, Take 1,000 mg by mouth every 6 (six) hours as needed for headache (pain)., Disp: , Rfl:    acetaminophen (TYLENOL) 650 MG suppository, Place 1 suppository (650 mg total) rectally every 6 (six) hours as needed for moderate pain., Disp: 15 suppository, Rfl: 2   dexamethasone (DECADRON) 4 MG tablet, Take 1/2 tablet (2 mg total) by mouth 2 (two) times daily., Disp: 28 tablet, Rfl: 0   Docusate Sodium (COLACE PO), Take 1 capsule by mouth daily., Disp: , Rfl:    famotidine (PEPCID) 20 MG tablet, Take 1 tablet (20 mg total) by mouth daily., Disp: 30 tablet, Rfl: 3   fentaNYL (DURAGESIC) 50 MCG/HR, Place 1 new patch on Newman skin every third day (every 72 hours). Remove old patch before applying new one. Please discard old patch after folding sticky ends together., Disp: 10 patch, Rfl: 0   glucose 4 GM chewable tablet, Chew 4 tablets by mouth every 10 minutes as needed for when blood sugar drops low defined as between 47m/dl and 754mdl and still able to swallow and speak clearly., Disp: 50 tablet, Rfl: 2   insulin lispro (HUMALOG) 100 UNIT/ML KwikPen, Inject 0-20 Units under Newman skin every six hours as needed. Administer number of units based on your blood glucose: BG 160-200 =1U: BG 201-240 =4U: BG 241-280 =6U: BG 281-300 =8U: BG > 300=10U & notify provider, Disp: 24 mL, Rfl: 0   metoCLOPramide (REGLAN) 10 MG tablet, Take 1 tablet (10 mg total) by mouth every 8 (eight) hours as needed for nausea., Disp: 30 tablet, Rfl: 1   Multiple Vitamin (MULTIVITAMIN WITH MINERALS) TABS tablet, Take 1 tablet by mouth daily., Disp: , Rfl:    omeprazole (PRILOSEC) 40 MG capsule, Take 1 capsule by mouth daily., Disp: 30 capsule, Rfl: 9   ondansetron (ZOFRAN ODT) 4 MG disintegrating tablet, Take 1 tablet (4 mg total) by mouth every 4 (four) hours as needed for up to 20 doses for nausea or vomiting., Disp: 20 tablet, Rfl: 0   ondansetron  (ZOFRAN) 8 MG tablet, Take 1 tablet (8 mg total) by mouth every 8 (eight) hours as needed for nausea or vomiting., Disp: 30 tablet, Rfl: 1   oxyCODONE (OXY IR/ROXICODONE) 5 MG immediate release tablet, Take 1-2 tablets  by mouth every 4 hours as needed for severe pain or breakthrough pain., Disp: 30 tablet, Rfl: 0   polyethylene glycol (MIRALAX / GLYCOLAX) 17 g packet, Take 17 g by mouth daily as needed (constipation)., Disp: , Rfl:    potassium chloride (KLOR-CON) 20 MEQ packet, Take 2 packets as directed by mouth once a day for 7 days., Disp: 14 packet, Rfl: 0   prochlorperazine (COMPAZINE) 10 MG tablet, Take 1 tablet (10 mg total) by mouth every 8 (eight) hours as needed for nausea or vomiting. (Patient taking differently: Take 10 mg by mouth 2 (two) times daily.), Disp: 30 tablet, Rfl: 0   promethazine (PHENERGAN) 12.5 MG suppository, Place 1 suppository (12.5 mg total) rectally every 8 (eight) hours as needed for up to 30 doses for nausea or vomiting., Disp: 30 each, Rfl: 0  Review of Systems: Pertinent positives as per HPI. Denies appetite changes, fevers, chills, unexplained weight changes. Denies hearing loss, neck lumps or masses, mouth sores, ringing in ears or voice changes. Denies cough or wheezing.  Denies shortness of breath. Denies chest pain or palpitations. Denies leg swelling. Denies abdominal distention, blood in stools, constipation, diarrhea, nausea, vomiting, or early satiety. Denies pain with intercourse, dysuria, frequency, hematuria or incontinence. Denies hot flashes, pelvic pain, vaginal bleeding or vaginal discharge.   Denies joint pain, back pain or muscle pain/cramps. Denies itching, rash, or wounds. Denies dizziness, headaches, numbness or seizures. Denies swollen lymph nodes or glands, denies easy bruising or bleeding. Denies anxiety, depression, confusion, or decreased concentration.  Physical Exam: BP 104/77 (BP Location: Left Arm, Patient Position: Sitting)     Pulse (!) 103    Temp 98.3 F (36.8 C) (Oral)    Resp 18    Ht 5' 1.81" (1.57 m)    Wt 146 lb 9.6 oz (66.5 kg)    LMP 07/05/2019    SpO2 100%    BMI 26.98 kg/m  General: Alert, oriented, no acute distress. HEENT: Normocephalic, atraumatic, sclera anicteric. Chest: Clear to auscultation bilaterally.  No wheezes or rhonchi. Cardiovascular: Mildly tachycardic with heart rate in Newman 100s, regular rhythm, no murmurs. Abdomen: Gastric tube in place draining gastric colored fluid.  Fullness within Newman mid abdomen more medial than previously noted.  JP drain noted to be in Newman left lateral aspect of Newman prior fluid collection, with minimal tissue noted in Newman tubing.  Newman fullness above Newman umbilicus is minimally fluctuant although I am unable to express any fluid with palpation of this area.  There is minimal blood-tinged discharge noted on Newman bandage. Extremities: Grossly normal range of motion.  Warm, well perfused.  No edema bilaterally.  Laboratory & Radiologic Studies: None new  Assessment & Plan: Debbrah Sampedro is a 46 y.o. 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  [facility] patient is overall done well since leaving [facility] hospital.  Symptoms of nausea and emesis are controlled now with G-tube.  She has good pain control.  She has maintaining weight and even gained a couple of pounds on parenteral nutrition again.  She started salvage chemotherapy while she was in [facility] hospital with plan for outpatient treatment at [facility].  Patient knows that I am available for any needs as her plan is  to stay living locally in [place] and travel every 3 weeks to [facility] for outpatient infusion.   I will tentatively schedule her for follow-up in 3 months.  Discussed that her JP drain will likely need to be either removed or removed at her follow-up visit in [facility].  I have encouraged her to continue flushing it.  It may be beneficial to interrogated and see if [place] JP drain can be repositioned within Newman obvious fluid collection.  Patient continues to have some spontaneous drainage through an incision that is several centimeters medial to Newman JP drain.    28 minutes of total time was spent for this patient encounter, including preparation, face-to-face counseling with [facility] patient and coordination of care, and documentation of [facility] encounter.  Jeral Pinch, MD  Division of [facility] On[facility]  Department of Obstetrics and Gynecology  [facility]

## 2021-09-16 NOTE — Unmapped (Signed)
Hi,     Chyna contacted the Communication Center regarding the following:    - States that she is calling to inform the care team that she her blood sugar was at 305 and she gave herself 10 units, states that she was informed to call and inform team    Please contact Ayannah at [phone].    Thanks in advance,    Rosary Lively  [facility] Communication Center   [phone]

## 2021-09-16 NOTE — Unmapped (Signed)
Return call to patient calling in to report her blood sugar is over 300.   She has covered with the sliding scale insulin.  She did forget to take the 20 units of NPH last night so advised likely her blood sugars will run higher today.  Discussed with Dr Merry Proud and advised patient that she does not have to call back unless blood sugar is running over 400.  She has set a reminder to take the NPH insulin this evening as instructed.  She has question about her drain that she will discuss with Dr Pricilla Holm at her visit this afternoon.  Otherwise she reports she is doing well and not having any pain or other concerns.

## 2021-09-17 ENCOUNTER — Encounter: Payer: Self-pay | Admitting: Gynecologic Oncology

## 2021-09-17 NOTE — Unmapped (Signed)
Telephone Call: Medication access    S/O: Colleen Russo is a 46 y.o. female with recurrent ovarian cancer not yet started treatment with oral cyclophosphamide and pembrolizumab. She reports that she has all of the medications she needs currently. She is working with medicaid as they are reporting that she has a different primary insurance but she denies having any other primary insurance. She is working with a Child psychotherapist to work through this issue so prescriptions such as insulin will not continue to be as expensive as they were when she left the hospital.      I spent approximately 5 minutes in patient care activities.    Referring physician: Dr. Olen Pel, PharmD, BCOP, CPP  Gynecologic Oncology Clinic Pharmacist  Pager: [phone]

## 2021-09-17 NOTE — Unmapped (Signed)
Outpatient Oncology Social Work   Initial Clinical Assessment      Referral:   Thornton Papas, LCSW.     Oncology Treatment Status:   Patient is a 46 year old female diagnosed with ovarian cancer.    Financial/Insurance:   Patient has [facility].     Psychosocial/Coping Issues:   SW introduced herself and her role, and talked with patient about resources to support her treatment at [facility].    Transportation - Patient has a family member that brings her to appointments, and they are signed up for medicaid transportation reimbursement.     Lodging - SW explained if patient has consecutive days of treatment and needs overnight lodging at the [facility], SW can apply on her behalf for a [employer]PA[employer] grant to cover the cost.    1 of [place] - Patient in need of assistance with gas cards and a water bill. SW completed and submitted 1 of [place] application requesting assistance.     SW emailed patient a summary of resources as requested, and included information on the above resources, CPAF household bill assistance, CPAF lodging support and [employer]. Patient has applied for and received the $1,000 [employer] grant.     No other issues or concerns reported at this time.  Sw will remain available to provide additional support, information, and resources as needed.       Shea Stakes, CCM  Oncology Outpatient Social Worker  [phone]

## 2021-09-18 NOTE — Unmapped (Signed)
Hi,     Colleen Russo has contacted the [employer] in regards to the following symptom:     Patient reports her surgar levels are 40    Please contact Ms. Kataoka  at [phone]    Thank you,  Kelli Hope   [facility] Cancer Communication Center   [phone]

## 2021-09-18 NOTE — Unmapped (Signed)
Return call to patient in regards to message of low blood sugar.  Patient reports her sugar was 40 earlier this morning, she is unsure of time but thinks it was around 10:00.  She ate some candy and waited a good while before rechecking and it was up to 305 when she finally rechecked.  She reports her home health nurse has been in to visit today since this occurred and advised she just hold off on dosing with any insulin at this time. We discussed signs of hypoglycemia to be alert for and recommended she check her sugar immediately if she started to have any of these symptoms even if she was not scheduled for a blood sugar check.  Discussed keeping something in hand that she can consume quickly if her blood sugar should drop again.  Advised I would reach out to team to discuss if her dose of long acting insulin that she takes prior to starting TPN may need to be adjusted.  She reports her sugar was also low yesterday morning at 72, but those are the two lowest numbers she has had. She doesn't think she has a full dose of insulin for tonight's dose but has spoken with the pharmacy that is providing and thinks it is being delivered.

## 2021-09-19 ENCOUNTER — Other Ambulatory Visit (HOSPITAL_COMMUNITY): Payer: Self-pay

## 2021-09-19 DIAGNOSIS — C569 Malignant neoplasm of unspecified ovary: Principal | ICD-10-CM

## 2021-09-19 MED ORDER — INSULIN NPH ISOPHANE U-100 HUMAN 100 UNIT/ML SUBCUTANEOUS SUSPENSION
Freq: Every day | SUBCUTANEOUS | 0 refills | 50.00000 days | Status: CP
Start: 2021-09-19 — End: 2021-10-19

## 2021-09-19 MED ORDER — INSULIN NPH (HUMAN) (ISOPHANE) 100 UNIT/ML ~~LOC~~ SUSP
SUBCUTANEOUS | 0 refills | Status: AC
  Filled 2021-09-19: qty 10, 50d supply, fill #0

## 2021-09-19 NOTE — Unmapped (Signed)
Patient with episodes of hypoglycemia. Discussed recommendation to decrease her NPH for tonight to 10 units and we will work on scheduling outpatient follow up to help address glycemic control.

## 2021-09-19 NOTE — Unmapped (Signed)
Hi,     Colleen Russo has contacted the [employer] in regards to the following symptom:     Dizziness, Lightheadedness  - States needing immediate assistance with her blood sugar.    Advised patient I would send priority message to triage nurse, if needing immediate assistance recommended patient call [phone].     Please contact Erma Heritage at [phone]    Thank you,  Yolanda Bonine   [facility] Cancer Communication Center   [phone]

## 2021-09-19 NOTE — Unmapped (Addendum)
Patient with episodes of hypoglycemia. Reinforced recommendation to decrease her NPH for tonight to 10 units and we will work on scheduling outpatient follow up to help address glycemic control. Last glucose 79 at home.    Gave her the outpatient clinic appointment to follow up. Discussed precautions to present to the ED.    Patient and family voiced understanding. All questions addressed.    Porfirio Mylar, MD  PGY-2, Obstetrics and Gynecology  [facility]

## 2021-09-19 NOTE — Unmapped (Signed)
Return call to patient regarding message of needing urgent help with blood sugar.  Patient answered phone and then handed off to EMS worker on site.  They report patient had symptomatic blood sugar of 59 and reported to them she was tapering off her TPN  Unclear at what time she had actually stopped the TPN.  They have treated her with oral glucose and she had also drank [facility] and ate something and repeat blood sugar was 79.  Believe that they were going to repeat before leaving scene as well.  I then  spoke with patient and inquired if she had received a call from anyone yesterday about her insulin treatment plan and patient reported no but states she had only taken 10 units of insulin last night.  She also reports that her new supply has not been delivered yet so she does not have a dose for tonight. Advised that I anticipate her blood sugar will probably get high again due to treatment for her low blood sugar but not to treat her next blood sugar check with the sliding scale insulin.  Advised I will be in touch before end of day to discuss plan for future insulin dosing.  Patient verbalizes understanding.      Upon completion of call, chart reviewed and read note from both the [facility] physician that had spoken with her this morning and Dr Allena Napoleon note from yesterday after I had spoken with her, so not clear why she did not recall those conversations although she had followed the instructions given to her.  I spoke with Dr Merry Proud who instructs patient should hold all future doses of long acting insulin for now.  I will follow up with patient later in day to review these instructions and see how she is doing.

## 2021-09-20 ENCOUNTER — Ambulatory Visit: Admit: 2021-09-20 | Payer: PRIVATE HEALTH INSURANCE

## 2021-09-20 ENCOUNTER — Encounter
Admit: 2021-09-20 | Discharge: 2021-10-02 | Disposition: A | Discharge status: Home Health | Payer: PRIVATE HEALTH INSURANCE

## 2021-09-20 ENCOUNTER — Ambulatory Visit: Admit: 2021-09-20 | Discharge: 2021-10-02 | Payer: PRIVATE HEALTH INSURANCE

## 2021-09-20 ENCOUNTER — Encounter: Admit: 2021-09-20 | Discharge: 2021-10-02 | Payer: PRIVATE HEALTH INSURANCE

## 2021-09-20 ENCOUNTER — Encounter: Admit: 2021-09-20 | Payer: PRIVATE HEALTH INSURANCE

## 2021-09-20 ENCOUNTER — Ambulatory Visit
Admit: 2021-09-20 | Discharge: 2021-10-02 | Disposition: A | Discharge status: Home Health | Payer: PRIVATE HEALTH INSURANCE

## 2021-09-20 ENCOUNTER — Encounter: Admit: 2021-09-20 | Payer: PRIVATE HEALTH INSURANCE | Attending: Gynecologic Oncology

## 2021-09-20 LAB — COMPREHENSIVE METABOLIC PANEL
ALBUMIN: 2.4 g/dL — ABNORMAL LOW (ref 3.4–5.0)
ALKALINE PHOSPHATASE: 56 U/L (ref 46–116)
ALT (SGPT): 9 U/L — ABNORMAL LOW (ref 10–49)
ANION GAP: 5 mmol/L (ref 5–14)
AST (SGOT): 15 U/L (ref <=34)
BILIRUBIN TOTAL: 0.2 mg/dL — ABNORMAL LOW (ref 0.3–1.2)
BLOOD UREA NITROGEN: 12 mg/dL (ref 9–23)
BUN / CREAT RATIO: 44
CALCIUM: 8.4 mg/dL — ABNORMAL LOW (ref 8.7–10.4)
CHLORIDE: 107 mmol/L (ref 98–107)
CO2: 27 mmol/L (ref 20.0–31.0)
CREATININE: 0.27 mg/dL — ABNORMAL LOW
EGFR CKD-EPI (2021) FEMALE: >90 mL/min/{1.73_m2} (ref >=60)
GLUCOSE RANDOM: 127 mg/dL (ref 70–179)
POTASSIUM: 4.1 mmol/L (ref 3.4–4.8)
PROTEIN TOTAL: 5 g/dL — ABNORMAL LOW (ref 5.7–8.2)
SODIUM: 139 mmol/L (ref 135–145)

## 2021-09-20 LAB — CBC W/ AUTO DIFF
BASOPHILS ABSOLUTE COUNT: 0 10*9/L (ref 0.0–0.1)
BASOPHILS RELATIVE PERCENT: 0.2 %
EOSINOPHILS ABSOLUTE COUNT: 0 10*9/L (ref 0.0–0.5)
EOSINOPHILS RELATIVE PERCENT: 0.1 %
HEMATOCRIT: 24.2 % — ABNORMAL LOW (ref 34.0–44.0)
HEMOGLOBIN: 7.9 g/dL — ABNORMAL LOW (ref 11.3–14.9)
LYMPHOCYTES ABSOLUTE COUNT: 0.7 10*9/L — ABNORMAL LOW (ref 1.1–3.6)
LYMPHOCYTES RELATIVE PERCENT: 8.4 %
MEAN CORPUSCULAR HEMOGLOBIN CONC: 32.8 g/dL (ref 32.0–36.0)
MEAN CORPUSCULAR HEMOGLOBIN: 27.2 pg (ref 25.9–32.4)
MEAN CORPUSCULAR VOLUME: 83 fL (ref 77.6–95.7)
MEAN PLATELET VOLUME: 7.7 fL (ref 6.8–10.7)
MONOCYTES ABSOLUTE COUNT: 0.3 10*9/L (ref 0.3–0.8)
MONOCYTES RELATIVE PERCENT: 3.9 %
NEUTROPHILS ABSOLUTE COUNT: 6.9 10*9/L (ref 1.8–7.8)
NEUTROPHILS RELATIVE PERCENT: 87.4 %
PLATELET COUNT: 263 10*9/L (ref 150–450)
RED BLOOD CELL COUNT: 2.92 10*12/L — ABNORMAL LOW (ref 3.95–5.13)
RED CELL DISTRIBUTION WIDTH: 17.6 % — ABNORMAL HIGH (ref 12.2–15.2)
WBC ADJUSTED: 7.8 10*9/L (ref 3.6–11.2)

## 2021-09-20 LAB — URINALYSIS WITH MICROSCOPY WITH CULTURE REFLEX
BILIRUBIN UA: NEGATIVE
BLOOD UA: NEGATIVE
GLUCOSE UA: NEGATIVE
KETONES UA: NEGATIVE
NITRITE UA: NEGATIVE
PH UA: 5.5 (ref 5.0–9.0)
PROTEIN UA: NEGATIVE
RBC UA: 1 /HPF (ref <=4)
SPECIFIC GRAVITY UA: 1.02 (ref 1.003–1.030)
SQUAMOUS EPITHELIAL: 13 /HPF — ABNORMAL HIGH (ref 0–5)
UROBILINOGEN UA: <2
WBC UA: <1 /HPF (ref 0–5)

## 2021-09-20 LAB — PREGNANCY, URINE: PREGNANCY TEST URINE: NEGATIVE

## 2021-09-20 MED ADMIN — dextrose 50 % in water (D50W) solution: INTRAVENOUS | Stop: 2021-09-20

## 2021-09-20 MED ADMIN — dextrose 5 % and sodium chloride 0.9 % infusion: 150 mL/h | INTRAVENOUS | Stop: 2021-09-20

## 2021-09-20 MED ADMIN — dextrose 50 % in water (D50W) 50 % solution 25 g: 25 g | INTRAVENOUS | @ 22:00:00 | Stop: 2021-09-20

## 2021-09-20 MED ADMIN — dextrose 50 % in water (D50W) 50 % solution 50 mL: 50 mL | INTRAVENOUS | Stop: 2021-09-20

## 2021-09-20 NOTE — Unmapped (Signed)
Outgoing call to patient to follow up from earlier episode of hypoglycemia.  Patient reports she is doing much better and sounds more alert and coherent.  She reports her blood sugars are good now, did not get her actual numbers as she said she was on phone with her home care pharmacist and switched our conversation over to a 3 way call.   Discussed with pharmacist, Fleet Contras from [facility],  Dr Burkett's recommendation to hold her long acting insulin and continue with sliding scale coverage only.  Fleet Contras agrees and also thinks that sliding scale doses should  be reduced and reports she can make this change.  She will follow up with patient tomorrow and take over dosing her insulin coverage and also adjusting her TPN formula.  Discussed with patient having follow up visit with physician on Tuesday and patient prefers phone/video visit due to travel.  Will arrange and encouraged patient to reach back out if she had concerns.

## 2021-09-21 LAB — CBC
HEMATOCRIT: 24.9 % — ABNORMAL LOW (ref 34.0–44.0)
HEMOGLOBIN: 8.3 g/dL — ABNORMAL LOW (ref 11.3–14.9)
MEAN CORPUSCULAR HEMOGLOBIN CONC: 33.1 g/dL (ref 32.0–36.0)
MEAN CORPUSCULAR HEMOGLOBIN: 27.5 pg (ref 25.9–32.4)
MEAN CORPUSCULAR VOLUME: 82.9 fL (ref 77.6–95.7)
MEAN PLATELET VOLUME: 7.6 fL (ref 6.8–10.7)
PLATELET COUNT: 275 10*9/L (ref 150–450)
RED BLOOD CELL COUNT: 3.01 10*12/L — ABNORMAL LOW (ref 3.95–5.13)
RED CELL DISTRIBUTION WIDTH: 17.2 % — ABNORMAL HIGH (ref 12.2–15.2)
WBC ADJUSTED: 7.6 10*9/L (ref 3.6–11.2)

## 2021-09-21 LAB — COMPREHENSIVE METABOLIC PANEL
ALBUMIN: 2.5 g/dL — ABNORMAL LOW (ref 3.4–5.0)
ALBUMIN: 2.4 g/dL — ABNORMAL LOW (ref 3.4–5.0)
ALBUMIN: 2.2 g/dL — ABNORMAL LOW (ref 3.4–5.0)
ALKALINE PHOSPHATASE: 61 U/L (ref 46–116)
ALKALINE PHOSPHATASE: 61 U/L (ref 46–116)
ALKALINE PHOSPHATASE: 66 U/L (ref 46–116)
ALT (SGPT): 11 U/L (ref 10–49)
ALT (SGPT): 10 U/L (ref 10–49)
ALT (SGPT): 12 U/L (ref 10–49)
ANION GAP: 7 mmol/L (ref 5–14)
ANION GAP: 6 mmol/L (ref 5–14)
ANION GAP: 8 mmol/L (ref 5–14)
AST (SGOT): 18 U/L (ref <=34)
AST (SGOT): 16 U/L (ref <=34)
AST (SGOT): 17 U/L (ref <=34)
BILIRUBIN TOTAL: 0.2 mg/dL — ABNORMAL LOW (ref 0.3–1.2)
BILIRUBIN TOTAL: 0.3 mg/dL (ref 0.3–1.2)
BILIRUBIN TOTAL: 0.3 mg/dL (ref 0.3–1.2)
BLOOD UREA NITROGEN: 14 mg/dL (ref 9–23)
BLOOD UREA NITROGEN: 9 mg/dL (ref 9–23)
BLOOD UREA NITROGEN: 10 mg/dL (ref 9–23)
BUN / CREAT RATIO: 47
BUN / CREAT RATIO: 27
BUN / CREAT RATIO: 26
CALCIUM: 8.4 mg/dL — ABNORMAL LOW (ref 8.7–10.4)
CALCIUM: 8.2 mg/dL — ABNORMAL LOW (ref 8.7–10.4)
CALCIUM: 8.4 mg/dL — ABNORMAL LOW (ref 8.7–10.4)
CHLORIDE: 107 mmol/L (ref 98–107)
CHLORIDE: 105 mmol/L (ref 98–107)
CHLORIDE: 105 mmol/L (ref 98–107)
CO2: 27 mmol/L (ref 20.0–31.0)
CO2: 25 mmol/L (ref 20.0–31.0)
CO2: 26 mmol/L (ref 20.0–31.0)
CREATININE: 0.3 mg/dL — ABNORMAL LOW
CREATININE: 0.33 mg/dL — ABNORMAL LOW
CREATININE: 0.38 mg/dL — ABNORMAL LOW
EGFR CKD-EPI (2021) FEMALE: >90 mL/min/{1.73_m2} (ref >=60)
EGFR CKD-EPI (2021) FEMALE: >90 mL/min/{1.73_m2} (ref >=60)
EGFR CKD-EPI (2021) FEMALE: >90 mL/min/{1.73_m2} (ref >=60)
GLUCOSE RANDOM: 76 mg/dL (ref 70–179)
GLUCOSE RANDOM: 174 mg/dL (ref 70–179)
GLUCOSE RANDOM: 53 mg/dL — ABNORMAL LOW (ref 70–179)
POTASSIUM: 3.9 mmol/L (ref 3.4–4.8)
POTASSIUM: 4.5 mmol/L (ref 3.4–4.8)
POTASSIUM: 4.2 mmol/L (ref 3.5–5.1)
PROTEIN TOTAL: 5.2 g/dL — ABNORMAL LOW (ref 5.7–8.2)
PROTEIN TOTAL: 5.1 g/dL — ABNORMAL LOW (ref 5.7–8.2)
PROTEIN TOTAL: 5 g/dL — ABNORMAL LOW (ref 5.7–8.2)
SODIUM: 141 mmol/L (ref 135–145)
SODIUM: 136 mmol/L (ref 135–145)
SODIUM: 139 mmol/L (ref 135–145)

## 2021-09-21 LAB — GLUCOSE, RANDOM
GLUCOSE RANDOM: 165 mg/dL (ref 70–179)
GLUCOSE RANDOM: 54 mg/dL — ABNORMAL LOW (ref 70–179)
GLUCOSE RANDOM: 76 mg/dL (ref 70–179)
GLUCOSE RANDOM: 98 mg/dL (ref 70–179)
GLUCOSE RANDOM: 57 mg/dL — ABNORMAL LOW (ref 70–179)
GLUCOSE RANDOM: 108 mg/dL (ref 70–179)

## 2021-09-21 LAB — T4, FREE: FREE T4: 0.86 ng/dL — ABNORMAL LOW (ref 0.89–1.76)

## 2021-09-21 LAB — CORTISOL
CORTISOL TOTAL: 2.4 ug/dL
CORTISOL TOTAL: 59.2 ug/dL

## 2021-09-21 LAB — INSULIN, TOTAL: INSULIN LEVEL: 54.7 mU/L — ABNORMAL HIGH (ref 2.6–37.6)

## 2021-09-21 LAB — MAGNESIUM: MAGNESIUM: 1.5 mg/dL — ABNORMAL LOW (ref 1.6–2.6)

## 2021-09-21 LAB — C-PEPTIDE: C-PEPTIDE: 0.11 ng/mL — ABNORMAL LOW (ref 0.48–5.05)

## 2021-09-21 LAB — TSH: THYROID STIMULATING HORMONE: 2.926 u[IU]/mL (ref 0.550–4.780)

## 2021-09-21 LAB — BETA HYDROXYBUTYRATE: BETA-HYDROXYBUTYRATE: 0.06 mmol/L (ref 0.02–0.27)

## 2021-09-21 LAB — APTT
APTT: 30.7 s (ref 25.1–36.5)
HEPARIN CORRELATION: 0.2

## 2021-09-21 LAB — PHOSPHORUS: PHOSPHORUS: 3.3 mg/dL (ref 2.4–5.1)

## 2021-09-21 LAB — T3, FREE: T3 FREE: 2.69 pg/mL (ref 2.30–4.20)

## 2021-09-21 LAB — PROTIME-INR
INR: 1.14
PROTIME: 13 s — ABNORMAL HIGH (ref 9.8–12.8)

## 2021-09-21 MED ADMIN — docusate sodium (COLACE) capsule 100 mg: 100 mg | ORAL | @ 14:00:00 | Stop: 2021-09-21

## 2021-09-21 MED ADMIN — glucagon injection 1 mg: 1 mg | SUBCUTANEOUS | @ 23:00:00 | Stop: 2021-09-21

## 2021-09-21 MED ADMIN — magnesium sulfate 2gm/50mL IVPB: 2 g | INTRAVENOUS | @ 19:00:00 | Stop: 2021-09-21

## 2021-09-21 MED ADMIN — dextrose 50 % in water (D50W) 50 % solution 25 g: 25 g | INTRAVENOUS | @ 03:00:00 | Stop: 2021-09-20

## 2021-09-21 MED ADMIN — enoxaparin (LOVENOX) syringe 40 mg: 40 mg | SUBCUTANEOUS | @ 14:00:00

## 2021-09-21 MED ADMIN — hydrocortisone (CORTEF) tablet 50 mg: 50 mg | ORAL | @ 19:00:00 | Stop: 2021-09-21

## 2021-09-21 MED ADMIN — lidocaine (LIDODERM) 5 % patch 1 patch: 1 | TRANSDERMAL | @ 14:00:00

## 2021-09-21 MED ADMIN — hydrocortisone (CORTEF) tablet 50 mg: 50 mg | ORAL | @ 07:00:00 | Stop: 2021-09-21

## 2021-09-21 MED ADMIN — metroNIDAZOLE (FLAGYL) tablet 500 mg: 500 mg | ORAL | @ 19:00:00 | Stop: 2021-09-23

## 2021-09-21 MED ADMIN — hydrocortisone (CORTEF) tablet 50 mg: 50 mg | ORAL | @ 11:00:00 | Stop: 2021-09-21

## 2021-09-21 MED ADMIN — fentaNYL (DURAGESIC) 50 mcg/hr 1 patch: 1 | TRANSDERMAL | @ 06:00:00 | Stop: 2021-09-30

## 2021-09-21 MED ADMIN — dextrose 10 % and sodium chloride 0.45 % infusion: 100 mL/h | INTRAVENOUS | @ 03:00:00

## 2021-09-21 MED ADMIN — dextrose 50 % in water (D50W) 50 % solution 25 mL: 25 mL | INTRAVENOUS | @ 07:00:00 | Stop: 2021-09-21

## 2021-09-21 MED ADMIN — levoFLOXacin (LEVAQUIN) tablet 750 mg: 750 mg | ORAL | @ 14:00:00 | Stop: 2021-09-23

## 2021-09-21 MED ADMIN — pantoprazole (PROTONIX) EC tablet 40 mg: 40 mg | ORAL | @ 14:00:00

## 2021-09-21 MED ADMIN — dextrose 10 % and sodium chloride 0.45 % infusion: 150 mL/h | INTRAVENOUS | @ 11:00:00 | Stop: 2021-09-21

## 2021-09-21 MED ADMIN — OLANZapine (ZyPREXA) tablet 5 mg: 5 mg | ORAL | @ 07:00:00

## 2021-09-21 MED ADMIN — OLANZapine (ZyPREXA) tablet 2.5 mg: 2.5 mg | ORAL | @ 15:00:00

## 2021-09-21 MED ADMIN — metroNIDAZOLE (FLAGYL) tablet 500 mg: 500 mg | ORAL | @ 14:00:00 | Stop: 2021-09-23

## 2021-09-21 MED ADMIN — scopolamine (TRANSDERM-SCOP) 1 mg over 3 days topical patch 1 mg: 1 | TOPICAL | @ 07:00:00

## 2021-09-21 NOTE — Unmapped (Signed)
Emergency Department Provider Note    [facility]  Emergency Department Provider Note     ED Clinical Impression     Final diagnoses:   Hypoglycemia without diagnosis of diabetes mellitus (Primary)        Impression, Medical Decision Making, ED Course     Impression: 46 y.o. female who has no past medical history on file. who presents via EMS with hypoglycemia as described below.     DDx/MDM: meds, critial illness, hormone def, non-islet cell tumor??, insulinoma, insulin secretagogue     Diagnostic workup as below. Will treat patient with IV D50W and IV D5NS 100 mL/hr.    Orders Placed This Encounter   Procedures   ??? Urine Culture   ??? CBC w/ Differential   ??? Comprehensive metabolic panel   ??? Pregnancy, urine   ??? Urinalysis with Microscopy with Culture Reflex   ??? POCT Glucose - RN Obtain   ??? Place Patient in Bed   ??? Place Patient in Bed   ??? ED Admit Decision       ED Course as of 09/20/21 2237   [facility] Sep 20, 2021   1832 Calcium(!): 8.4   1832 Glucose: 127   1832 HGB(!): 7.9   1833 Creatinine(!): 0.27   1833 Preg Test, Ur: Negative   1833 Glucose, POC(!!): 35  Treated with 25 g IV D50W. BG still 30s. Additional IV D50W and IV D5NS 100 mL/hr.   2113 BG 66, increased D5NS to 150 mL/hr.   2231 Repeat BG 51, treated with an additional 25 g of D50W.       Independent Interpretation of Studies: I have independently interpreted the following studies:  ??? CBC, CMP, UA  Discussion of Management With Other Providers or Support Staff: I discussed the management of this patient with the:  ??? Ob/Gyn    ____________________________________________    The case was discussed with the attending physician, who is in agreement with the above assessment and plan.      History     Chief Complaint  Hypoglycemia    HPI   Colleen Russo is a 46 y.o. female with past medical history of ovarian cancer and SBO on TPN presenting for evaluation of non-diabetic hypoglycemia. On EMS arrival blood glucose was found to be 56. On arrival to [facility] ED, POC glucose was 30. She was treated with 25 g IV D50W.    Recently admitted 1/14 to 2/10 to the Gyn Onc service at [facility].  She was found to have abdominal mass and small bowel obstruction secondary to recurrent stage IIIa clear-cell carcinoma of the ovary.  She had transesophageal gastric tube placed.  She was also treated with Zosyn and then levofloxacin and Flagyl until 2/19 due to infected she was started on TPN 1/28.  Subcutaneous fluid collection.  She initially had elevated blood glucoses and started on NPH and sliding scale.  Patient called on-call GYN on 2/11 with complaint of glucoses greater than 300.  Per the note seems that there was some collusion between using the NPH versus lispro as sliding scale.  She was instructed to only administer short acting lispro.      Called again on 2/15 with reports that blood sugar was 40.  Was instructed to decrease her NPH from 20 units nightly to 10 units nightly at that time.  She then called again on 2/16 with complaints of dizziness and lightheadedness.  Patient has been states that blood glucose was in the 50s  so they called EMS.  She told EMS that she was tapering off her TPN at that time.  After being treated with oral glucose and drinking soda, blood glucose improved to 79.  Current diabetes regimen includes NPH 10 units nightly and sliding scale insulin (2 units for 250 or greater, 4 units 251-300, 6 units greater than 301); however she states that she did not take the 10 units of NPH last night.  Reports last insulin was 4 units yesterday evening.  Then ran her TPN for 14 hours overnight as usual.  Was concerned this morning around 5 AM when she was still having low blood glucoses despite TPN.  Thought initially that it may be due to draining of ostomy after giving glucose for treatment of hypoglycemia.  Reports diaphoresis and lightheadedness with low blood sugars.  States that initially she thought was due to hot flashes.  Denies fevers, cough, chest pain, abdominal pain, changes in urination.  She is afebrile hemodynamically stable on room air.    Outside Historian(s): I have obtained additional history/collateral patient's husband at bedside.    External Records Reviewed: I have reviewed records from prior admission and recent telephone encounters.    No past medical history on file.    Past Surgical History:   Procedure Laterality Date   ??? IR INSERT G-TUBE PERCUTANEOUS  09/06/2021    IR INSERT G-TUBE PERCUTANEOUS 09/06/2021 Gwenlyn Fudge, MD IMG VIR H&V [facility]         Current Facility-Administered Medications:   ???  dextrose 10 % and sodium chloride 0.45 % infusion, 100 mL/hr, Intravenous, Continuous, Janit Pagan, MD, Last Rate: 100 mL/hr at 09/20/21 2221, 100 mL/hr at 09/20/21 2221    Current Outpatient Medications:   ???  acetaminophen (TYLENOL) 325 MG tablet, Take 2 tablets (650 mg total) by mouth every six (6) hours as needed., Disp: , Rfl: 0  ???  bisacodyL (DULCOLAX) 10 mg suppository, Insert 1 suppository (10 mg total) into the rectum daily as needed., Disp: 12 suppository, Rfl: 0  ???  calcium carbonate (TUMS) 200 mg calcium (500 mg) chewable tablet, Chew 1 tablet (200 mg of elem calcium total) Three (3) times a day as needed., Disp: , Rfl: 0  ???  [START ON 09/24/2021] cyclophosphamide (CYTOXAN) 50 mg capsule, Take 1 capsule (50 mg total) by mouth daily . Do not start until discussing with your oncologist, Disp: 30 capsule, Rfl: 5  ???  dexAMETHasone (DECADRON) 1 MG tablet, Take 2 tablets (2 mg total) by mouth daily for 3 days, THEN 1 tablet (1 mg total) daily for 7 days., Disp: 13 tablet, Rfl: 0  ???  docusate sodium (COLACE) 100 MG capsule, Take 100 mg by mouth daily., Disp: , Rfl:   ???  famotidine (PEPCID) 20 MG tablet, Take 20 mg by mouth daily., Disp: , Rfl:   ???  fentaNYL (DURAGESIC) 50 mcg/hr patch, Place 1 patch on the skin every third day., Disp: 10 patch, Rfl: 0  ???  glucose 4 GM chewable tablet, Chew 4 tablets (16 g total) every ten (10) minutes as needed for low blood sugar ((For Blood Glucose LESS than 70 mg/dL and GREATER than or EQUAL to 54 mg/dL and able to speak clearly and able to swallow/NOT NPO.))., Disp: 50 tablet, Rfl: 2  ???  hydrOXYzine (ATARAX) 25 MG tablet, Take 1 tablet (25 mg total) by mouth every six (6) hours as needed for anxiety for up to 14 days., Disp: 30 tablet, Rfl: 0  ???  insulin lispro (HUMALOG) 100 unit/mL injection pen, Inject 0-0.2 mL (0-20 Units total) under the skin every six (6) hours as needed. Administer number of insulin units based on your blood glucose: BG 160-200 = 1 unit BG 201-240 = 4 units BG 241-280 = 6 units BG 281-300 = 8 units BG > 300 = 10 units and notify provider, Disp: 24 mL, Rfl: 0  ???  insulin NPH (HUMULIN N NPH U-100 INSULIN) 100 unit/mL injection, Inject 20 Units under the skin daily. Inject 1 hour prior to starting TPN infusion, Disp: 10 mL, Rfl: 0  ???  levoFLOXacin (LEVAQUIN) 750 MG tablet, Take 1 tablet (750 mg total) by mouth daily for 9 days., Disp: 9 tablet, Rfl: 0  ???  lidocaine 4 % patch, Place 1 patch on the skin daily. Apply to affected area for 12 hours only each day (then remove patch)., Disp: 30 patch, Rfl: 0  ???  metoclopramide (REGLAN) 10 MG tablet, Take 10 mg by mouth every eight (8) hours., Disp: , Rfl:   ???  metroNIDAZOLE (FLAGYL) 500 MG tablet, Take 1 tablet (500 mg total) by mouth Three (3) times a day for 10 days., Disp: 30 tablet, Rfl: 0  ???  multivitamin therapeutic with minerals (THERA-M) 27-0.4 mg Tab, Take 1 tablet by mouth daily., Disp: , Rfl:   ???  OLANZapine (ZYPREXA) 2.5 MG tablet, Take 1 tablet (2.5 mg total) by mouth daily., Disp: 30 tablet, Rfl: 0  ???  OLANZapine (ZYPREXA) 5 MG tablet, Take 1 tablet (5 mg total) by mouth nightly., Disp: 30 tablet, Rfl: 0  ???  omeprazole (PRILOSEC) 40 MG capsule, Take 40 mg by mouth daily., Disp: , Rfl:   ???  polyethylene glycol (GLYCOLAX) 17 gram/dose powder, Take 17 g by mouth daily., Disp: , Rfl:   ???  prochlorperazine (COMPAZINE) 10 MG tablet, Take 10 mg by mouth every eight (8) hours as needed., Disp: , Rfl:   ???  scopolamine (TRANSDERM-SCOP) 1 mg over 3 days, Place 1 patch on the skin every third day., Disp: , Rfl:   ???  senna (SENOKOT) 8.6 mg tablet, Take 2 tablets by mouth two (2) times a day as needed for constipation., Disp: 30 tablet, Rfl: 0  ???  simethicone (MYLICON) 80 MG chewable tablet, Chew 1 tablet (80 mg total) every six (6) hours as needed., Disp: 100 tablet, Rfl: 0  ???  sodium chloride (NS) 0.9 % injection, Use 5 mL by Intra-cannular route daily., Disp: 150 mL, Rfl: 0  ???  zinc oxide-cod liver oil (DESITIN 40%) 40 % Pste, Apply topically daily., Disp: 57 g, Rfl: 0    Allergies  Carboplatin and Ketamine    Family History  No family history on file.    Social History  Social History     Tobacco Use   ??? Smoking status: Never   ??? Smokeless tobacco: Never        Physical Exam     VITAL SIGNS:      Vitals:    09/20/21 1800 09/20/21 1843 09/20/21 2000 09/20/21 2230   BP: 117/85 99/74 125/74 98/67   Pulse: 80 78 86 80   Resp: 20 14 21 14    SpO2: 100% 100% 100% 98%       Constitutional: Alert and oriented. No acute distress.  Eyes: Conjunctivae are normal.  Pupils equal and reactive to light.  HEENT: Normocephalic and atraumatic. Conjunctivae clear. No congestion. Moist mucous membranes.   Cardiovascular: Rate as above, regular rhythm. Normal and  symmetric distal pulses.  Approximately 2 seconds pupils equal and reactive to light Capillary refill . Normal skin turgor.  Respiratory: Normal respiratory effort. Breath sounds are normal. There are no wheezing or crackles heard.  Gastrointestinal: Soft, non-distended, non-tender. J tube and PEG tube in place. Ostomy with brown stool.  Genitourinary: Deferred.  Musculoskeletal: Non-tender with normal range of motion in all extremities.  Neurologic: Normal speech and language. No gross focal neurologic deficits are appreciated. Patient is moving all extremities equally, face is symmetric at rest and with speech.  Skin: Skin is warm, dry and intact. No rash noted.  Psychiatric: Mood and affect are normal. Speech and behavior are normal.     Radiology     No orders to display       Pertinent labs & imaging results that were available during my care of the patient were independently interpreted by me and considered in my medical decision making (see chart for details).    Portions of this record have been created using Scientist, clinical (histocompatibility and immunogenetics). Dictation errors have been sought, but may not have been identified and corrected.        ED Course     ED Course as of 09/20/21 2237   Fri Sep 20, 2021   1832 Calcium(!): 8.4   1832 Glucose: 127   1832 HGB(!): 7.9   1833 Creatinine(!): 0.27   1833 Preg Test, Ur: Negative   1833 Glucose, POC(!!): 35  Treated with 25 g IV D50W. BG still 30s. Additional IV D50W and IV D5NS 100 mL/hr.   2113 BG 66, increased D5NS to 150 mL/hr.   2231 Repeat BG 51, treated with an additional 25 g of D50W.             Valeta Harms, MD  Resident  09/20/21 [phone]

## 2021-09-21 NOTE — Unmapped (Signed)
GYN ONC History and Physical    ASSESSMENT & PLAN     Colleen Russo is a 46 y.o.  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     Principal Problem:    Hypoglycemia  Active Problems:    Ovarian cancer (CMS-HCC)    Type 2 diabetes mellitus (CMS-HCC)    Sick-euthyroid syndrome    Enterocutaneous fistula    Abscess    Poor nutrition    Anemia of chronic disease      Onc: Recurrent Clear Cell Carcinoma of the Ovary   - Primary Oncologist: [name]  - s/p XL??with??radical tumor debulking including??total hysterectomy,??bilateral salpingo-oophorectomy, infra-gastric??omentectomy, and??washings??on 07/21/2019  -??s/p 6 cycles carbo/taxol, completed 12/05/19  -CT 2/022 concerning for progression, started on carbo/gem 2/22-5/24/22.  -CT 12/2020 stable disease  - 01/23/21:??Diagnostic laparoscopy, exploratory laparotomy, unavoidable enterotomy with repair  -02/2021: noted to have EC fistuala,??OR on 7/2 for incision, exploration, and drainage of subcutaneous abscess??(6x8 cm) lateral to recent ex-lap incision. No definitive succus seen intra-op.??Treated conservatively with bowel rest and TPN for 1 month then had several I&D of recollected abscess at site of EC fistula with drain placed and left until clinical and imaging evidence of resolution????  -12/29-08/07/21 admission for partial SBO and abdominal wall fluid collection, biopsy of fluid collection consistent at first for upper GI or pancreatobiliary primary??  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  - Last CA-125:??180 on 09/06/2021  ??  Refractory Hypoglycemia - H/o Type 2 Diabetes   - Discharged on NPH 20 units daily to take 1 hr prior to TPN  - Reports recurrent symptomatic lows, several Telephone Encounters in the chart document working with Gyn Onc and Nutrition teams to decrease her insulin and adjust her TPN   - Admit WBC, UA, lytes normal; low concern for acute infection.  Continues on levofloxacin and Flagyl as below.  - Endocrinology consulted, appreciate recs:  + 09/20/21: Recommend titration of D10 mIVF to BG > 90 mg/dL, initiation of hydrocortisone 50 mg daily and if BG < 50, additional laboratory work up (serum BG, insulin, pro-insulin, c-peptide, insulin antibody, beta-hydroxy-butyrate, cortisol, ACTH, glucagon, IGF-1, IGF-2 and sulfonylurea screen). Their team will see her in the AM.   - Plan Nutrition consult and TPN order in the AM    Sick Euthyroid Syndrome  - Last admit TSH 0.237, FT4 0.54    Small bowel obstruction   - Extensive surgical history  - Presented to ER on 08/16/21 with SBO refractory to conservative mngt so VIR placed TEG on 2/3.  - Denies abdominal pain, nausea, vomiting on admit. Tolerating clears and bites of food at home.   - Continue home scopolamine patch, using Reglan and Compazine PRN  - Transitioned from dexamethasone to hydrocortisone per Endo recs  - Octreotide to be ordered with TPN  ??  Enterocutaneous Fistula - Hx abscesses - Leukocytosis  - History of enterocutaneous fistula,??and abscesses requiring drainage.  - Most recently, abscess drained by VIR 2/7, initially on Zosyn > continues on Levofloxacin and Flagyl until 2/19   ??  Chronic Pain  - S/p Palliative Care consult, pain well controlled on 50 mcg fentanyl patches and Tylenol PRN  ??  Poor Nutrition  - Nutrition consult in the AM  - Plan to continue TPN  ??  Anemia of Chronic Disease   -  Admit Hgb 7.9 stable from prior      Prophylaxis: Lovenox    Code Status: full code, confirmed on admission.     Disposition: floor    The patient's presentation is complicated by the following clinically significant conditions requiring additional evaluation and treatment or having a significant effect of this patient's care: - Malnutrition POA requiring further investigation, treatment, or monitoring    Plan discussed with Dr. Sherlean Foot. Attending, Dr. Ruthe Mannan immediately available.     HPI     Chief Complaint: Low blood sugar    Colleen Russo is a 46 y.o. 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  She was most recently discharged on February 9, at that time she was taking NPH 20 units daily 1 hour prior to her TPN.  However, she began to have regular symptomatic lows, sometimes into the 30s.  She reached out by telephone to nutrition and the GYN oncology team with the assistance of her home health nurse, and was titrated as an outpatient to no scheduled insulin and only sliding scale as available.  She has been more than 48 hours without long-acting insulin.  She received only 4 units of lispro yesterday at 4 PM.  However, despite taking her TPN and some clears and small bites by mouth, she has had recurrent blood sugars into the 30s at home.  When her blood sugar drops below 60, she feels hot and shaky and anxious.  She was transported to the emergency room by EMS.    She reports that when she gets D50 or has a sugar gummy in the setting of her hypoglycemia, her sugar initially responds and she feels much better.  But she 'just cannot seem to keep it up once it is dropping.    Upon evaluation, she denies fever, chills, chest pain, shortness of breath.  She denies abdominal pain, poor output from her G-tube.  She reports intermittent leaking around the G-tube site without surrounding erythema or discharge.  She denies dysuria, polyuria, urinary frequency or retention.  She denies any pain or discomfort around her port site.  She denies nausea, vomiting.  She otherwise feels well and denies sick contacts.    Oncology History:  Oncology History   Ovarian cancer (CMS-HCC)   08/17/2021 Initial Diagnosis    Ovarian cancer (CMS-HCC)     09/30/2021 -  Chemotherapy    OP OVARIAN PEMBROLIZUMAB / BEVACIZUMAB / CYCLOPHOSPHAMIDE (PO)  pembrolizumab 200 mg IV, bevacizumab 15 mg/kg IV every 21 days  oral cyclophosphamide 50 mg daily for 21 days         Obstetric History: No obstetric history on file.    Past Medical History:  No past medical history on file.    Past Surgical History:  Past Surgical History:   Procedure Laterality Date   ??? IR INSERT G-TUBE PERCUTANEOUS  09/06/2021    IR INSERT G-TUBE PERCUTANEOUS 09/06/2021 Gwenlyn Fudge, MD IMG [facility]IR [facility]       Allergies:  Carboplatin and Ketamine    Medications:  Current Facility-Administered Medications   Medication Dose Route Frequency Provider Last Rate Last Admin   ??? acetaminophen (TYLENOL) tablet 650 mg  650 mg Oral Q6H PRN Hassan Buckler, MD       ??? calcium carbonate (TUMS) chewable tablet 200 mg of elem calcium  200 mg of elem calcium Oral TID PRN Hassan Buckler, MD       ???  dextrose 10 % and sodium chloride 0.45 % infusion  150 mL/hr Intravenous Continuous Hassan Buckler, MD 150 mL/hr at 09/21/21 0042 150 mL/hr at 09/21/21 0042   ??? docusate sodium (COLACE) capsule 100 mg  100 mg Oral Daily Hassan Buckler, MD       ??? enoxaparin (LOVENOX) syringe 40 mg  40 mg Subcutaneous Q24H Hassan Buckler, MD       ??? fentaNYL (DURAGESIC) 50 mcg/hr 1 patch  1 patch Transdermal Q72H Hassan Buckler, MD   1 patch at 09/21/21 0129   ??? hydrocortisone (CORTEF) tablet 50 mg  50 mg Oral Q6H Hassan Buckler, MD   50 mg at 09/21/21 0130   ??? hydrOXYzine (ATARAX) tablet 25 mg  25 mg Oral Q6H PRN Hassan Buckler, MD       ??? levoFLOXacin (LEVAQUIN) tablet 750 mg  750 mg Oral Daily Hassan Buckler, MD       ??? lidocaine (LIDODERM) 5 % patch 1 patch  1 patch Transdermal Daily Hassan Buckler, MD       ??? metoclopramide (REGLAN) tablet 10 mg  10 mg Oral Q8H PRN Hassan Buckler, MD       ??? metroNIDAZOLE (FLAGYL) tablet 500 mg  500 mg Oral TID Hassan Buckler, MD       ??? naloxone (NARCAN) injection 0.1 mg  0.1 mg Intravenous Q5 Min PRN Hassan Buckler, MD       ??? OLANZapine (ZyPREXA) tablet 2.5 mg  2.5 mg Oral Daily Hassan Buckler, MD       ??? OLANZapine (ZyPREXA) tablet 5 mg  5 mg Oral Nightly Hassan Buckler, MD   5 mg at 09/21/21 0130   ??? pantoprazole (PROTONIX) EC tablet 40 mg  40 mg Oral Daily Hassan Buckler, MD       ??? polyethylene glycol (MIRALAX) packet 17 g  17 g Oral Daily Hassan Buckler, MD       ??? scopolamine (TRANSDERM-SCOP) 1 mg over 3 days topical patch 1 mg  1 patch Topical Q72H Hassan Buckler, MD   1 mg at 09/21/21 0131   ??? senna (SENOKOT) tablet 2 tablet  2 tablet Oral BID PRN Hassan Buckler, MD       ??? simethicone (MYLICON) chewable tablet 80 mg  80 mg Oral Q6H PRN Hassan Buckler, MD       ??? sodium chloride (NS) 0.9 % infusion  100 mL/hr Intravenous Continuous Hassan Buckler, MD         Current Outpatient Medications   Medication Sig Dispense Refill   ??? acetaminophen (TYLENOL) 325 MG tablet Take 2 tablets (650 mg total) by mouth every six (6) hours as needed.  0   ??? bisacodyL (DULCOLAX) 10 mg suppository Insert 1 suppository (10 mg total) into the rectum daily as needed. 12 suppository 0   ??? calcium carbonate (TUMS) 200 mg calcium (500 mg) chewable tablet Chew 1 tablet (200 mg of elem calcium total) Three (3) times a day as needed.  0   ??? [START ON 09/24/2021] cyclophosphamide (CYTOXAN) 50 mg capsule Take 1 capsule (50 mg total) by mouth daily . Do not start until discussing with your oncologist 30 capsule 5   ??? dexAMETHasone (DECADRON) 1 MG tablet Take 2 tablets (2 mg total) by mouth daily for 3 days, THEN 1 tablet (1 mg total) daily for 7 days. 13 tablet 0   ??? docusate sodium (COLACE) 100 MG capsule Take 100 mg by mouth daily.     ??? famotidine (PEPCID) 20 MG tablet  Take 20 mg by mouth daily.     ??? fentaNYL (DURAGESIC) 50 mcg/hr patch Place 1 patch on the skin every third day. 10 patch 0   ??? glucose 4 GM chewable tablet Chew 4 tablets (16 g total) every ten (10) minutes as needed for low blood sugar ((For Blood Glucose LESS than 70 mg/dL and GREATER than or EQUAL to 54 mg/dL and able to speak clearly and able to swallow/NOT NPO.)). 50 tablet 2   ??? hydrOXYzine (ATARAX) 25 MG tablet Take 1 tablet (25 mg total) by mouth every six (6) hours as needed for anxiety for up to 14 days. 30 tablet 0   ??? insulin lispro (HUMALOG) 100 unit/mL injection pen Inject 0-0.2 mL (0-20 Units total) under the skin every six (6) hours as needed. Administer number of insulin units based on your blood glucose: BG 160-200 = 1 unit BG 201-240 = 4 units BG 241-280 = 6 units BG 281-300 = 8 units BG > 300 = 10 units and notify provider 24 mL 0   ??? insulin NPH (HUMULIN N NPH U-100 INSULIN) 100 unit/mL injection Inject 20 Units under the skin daily. Inject 1 hour prior to starting TPN infusion 10 mL 0   ??? levoFLOXacin (LEVAQUIN) 750 MG tablet Take 1 tablet (750 mg total) by mouth daily for 9 days. 9 tablet 0   ??? lidocaine 4 % patch Place 1 patch on the skin daily. Apply to affected area for 12 hours only each day (then remove patch). 30 patch 0   ??? metoclopramide (REGLAN) 10 MG tablet Take 10 mg by mouth every eight (8) hours.     ??? metroNIDAZOLE (FLAGYL) 500 MG tablet Take 1 tablet (500 mg total) by mouth Three (3) times a day for 10 days. 30 tablet 0   ??? multivitamin therapeutic with minerals (THERA-M) 27-0.4 mg Tab Take 1 tablet by mouth daily.     ??? OLANZapine (ZYPREXA) 2.5 MG tablet Take 1 tablet (2.5 mg total) by mouth daily. 30 tablet 0   ??? OLANZapine (ZYPREXA) 5 MG tablet Take 1 tablet (5 mg total) by mouth nightly. 30 tablet 0   ??? omeprazole (PRILOSEC) 40 MG capsule Take 40 mg by mouth daily.     ??? polyethylene glycol (GLYCOLAX) 17 gram/dose powder Take 17 g by mouth daily.     ??? prochlorperazine (COMPAZINE) 10 MG tablet Take 10 mg by mouth every eight (8) hours as needed.     ??? scopolamine (TRANSDERM-SCOP) 1 mg over 3 days Place 1 patch on the skin every third day.     ??? senna (SENOKOT) 8.6 mg tablet Take 2 tablets by mouth two (2) times a day as needed for constipation. 30 tablet 0   ??? simethicone (MYLICON) 80 MG chewable tablet Chew 1 tablet (80 mg total) every six (6) hours as needed. 100 tablet 0   ??? sodium chloride (NS) 0.9 % injection Use 5 mL by Intra-cannular route daily. 150 mL 0   ??? zinc oxide-cod liver oil (DESITIN 40%) 40 % Pste Apply topically daily. 57 g 0       Past Social History:  Social History     Tobacco Use   ??? Smoking status: Never   ??? Smokeless tobacco: Never        Family History:  family history is not on file.    Immunizations:   Immunization History   Administered Date(s) Administered   ??? COVID-19 VACC,MRNA,(PFIZER)(PF) 01/12/2020, 02/02/2020  Review of Systems:  A comprehensive review of 14 systems was negative except for pertinent positives noted in HPI.    PHYSICAL EXAM     Heart Rate:  [78-87] 87  SpO2 Pulse:  [79-81] 79  Resp:  [13-21] 13  BP: (98-125)/(67-85) 98/67  SpO2:  [98 %-100 %] 99 %    -General:   Well-appearing in NAD.  -CV:    RRR. No m/r/g. Port site without surrounding erythema, induration or drainage.  -Pulm:   CTAB. Good air movement. Normal WOB.  -Abd:    Non-tender, non-distended abdomen.  Abdominal incisions well healed. TEG site and JP drain sites without leakage covered by bandage without strike-through. No guarding, no rebound.   -Extremities:   No clubbing, erythema, or edema.   -Psych:   Appropriate.The patient is awake and alert.  -GU:    Deferred     LABS AND IMAGING     All lab results last 24 hours:    Recent Results (from the past 24 hour(s))   POCT Glucose    Collection Time: 09/20/21  5:03 PM   Result Value Ref Range    Glucose, POC 33 (LL) 70 - 179 mg/dL   POCT Glucose    Collection Time: 09/20/21  5:31 PM   Result Value Ref Range    Glucose, POC 127 70 - 179 mg/dL   POCT Glucose    Collection Time: 09/20/21  5:34 PM   Result Value Ref Range    Glucose, POC 114 70 - 179 mg/dL   Comprehensive metabolic panel    Collection Time: 09/20/21  5:37 PM   Result Value Ref Range    Sodium 139 135 - 145 mmol/L    Potassium 4.1 3.4 - 4.8 mmol/L    Chloride 107 98 - 107 mmol/L    CO2 27.0 20.0 - 31.0 mmol/L    Anion Gap 5 5 - 14 mmol/L    BUN 12 9 - 23 mg/dL    Creatinine 1.61 (L) 0.60 - 0.80 mg/dL    BUN/Creatinine Ratio 44     eGFR CKD-EPI (2021) Female >90 >=60 mL/min/1.84m2    Glucose 127 70 - 179 mg/dL    Calcium 8.4 (L) 8.7 - 10.4 mg/dL    Albumin 2.4 (L) 3.4 - 5.0 g/dL    Total Protein 5.0 (L) 5.7 - 8.2 g/dL    Total Bilirubin 0.2 (L) 0.3 - 1.2 mg/dL    AST 15 <=09 U/L    ALT 9 (L) 10 - 49 U/L    Alkaline Phosphatase 56 46 - 116 U/L   Pregnancy, urine    Collection Time: 09/20/21  5:37 PM   Result Value Ref Range    Pregnancy Test, Urine Negative Negative   Urinalysis with Microscopy with Culture Reflex    Collection Time: 09/20/21  5:37 PM    Specimen: Clean Catch; Urine   Result Value Ref Range    Color, UA Light Yellow     Clarity, UA Turbid     Specific Gravity, UA 1.020 1.003 - 1.030    pH, UA 5.5 5.0 - 9.0    Leukocyte Esterase, UA Small (A) Negative    Nitrite, UA Negative Negative    Protein, UA Negative Negative    Glucose, UA Negative Negative    Ketones, UA Negative Negative    Urobilinogen, UA <2.0 mg/dL <6.0 mg/dL    Bilirubin, UA Negative Negative    Blood, UA Negative Negative    RBC, UA 1 <=  4 /HPF    WBC, UA <1 0 - 5 /HPF    Squam Epithel, UA 13 (H) 0 - 5 /HPF    Bacteria, UA Rare (A) None Seen /HPF    Mucus, UA Rare (A) None Seen /HPF   CBC w/ Differential    Collection Time: 09/20/21  5:37 PM   Result Value Ref Range    WBC 7.8 3.6 - 11.2 10*9/L    RBC 2.92 (L) 3.95 - 5.13 10*12/L    HGB 7.9 (L) 11.3 - 14.9 g/dL    HCT 57.8 (L) 46.9 - 44.0 %    MCV 83.0 77.6 - 95.7 fL    MCH 27.2 25.9 - 32.4 pg    MCHC 32.8 32.0 - 36.0 g/dL    RDW 62.9 (H) 52.8 - 15.2 %    MPV 7.7 6.8 - 10.7 fL    Platelet 263 150 - 450 10*9/L    Neutrophils % 87.4 %    Lymphocytes % 8.4 %    Monocytes % 3.9 %    Eosinophils % 0.1 %    Basophils % 0.2 %    Absolute Neutrophils 6.9 1.8 - 7.8 10*9/L    Absolute Lymphocytes 0.7 (L) 1.1 - 3.6 10*9/L    Absolute Monocytes 0.3 0.3 - 0.8 10*9/L    Absolute Eosinophils 0.0 0.0 - 0.5 10*9/L    Absolute Basophils 0.0 0.0 - 0.1 10*9/L    Anisocytosis Slight (A) Not Present   POCT Glucose    Collection Time: 09/20/21  6:28 PM   Result Value Ref Range    Glucose, POC 35 (LL) 70 - 179 mg/dL   POCT Glucose    Collection Time: 09/20/21  7:19 PM   Result Value Ref Range    Glucose, POC 119 70 - 179 mg/dL   POCT Glucose    Collection Time: 09/20/21  8:07 PM   Result Value Ref Range    Glucose, POC 102 70 - 179 mg/dL   POCT Glucose    Collection Time: 09/20/21  9:57 PM   Result Value Ref Range    Glucose, POC 51 (L) 70 - 179 mg/dL   POCT Glucose    Collection Time: 09/20/21 11:11 PM   Result Value Ref Range    Glucose, POC 114 70 - 179 mg/dL   UXLKG-40 PCR    Collection Time: 09/20/21 11:47 PM    Specimen: Nasopharyngeal Swab   Result Value Ref Range    SARS-CoV-2 PCR Negative Negative   Type and Screen    Collection Time: 09/20/21 11:54 PM   Result Value Ref Range    ABO Grouping O POS     Antibody Screen NEG    POCT Glucose    Collection Time: 09/21/21 12:36 AM   Result Value Ref Range    Glucose, POC 54 (L) 70 - 179 mg/dL   POCT Glucose    Collection Time: 09/21/21  1:22 AM   Result Value Ref Range    Glucose, POC 55 (L) 70 - 179 mg/dL

## 2021-09-21 NOTE — Unmapped (Signed)
BIB [facility] for hypoglycemia. Pt is a cancer pt here. On tpn

## 2021-09-21 NOTE — Unmapped (Signed)
RN removed patient home fentanyl patch. New patch administered per [facility] order. Home patch disposed of in controlled substances waste container. Witnessed by Kathrene Alu, RN.

## 2021-09-21 NOTE — Unmapped (Signed)
MICU History & Physical     Date of Service: 09/21/2021    Problem List:   Principal Problem:    Hypoglycemia  Active Problems:    Ovarian cancer (CMS-HCC)    Type 2 diabetes mellitus (CMS-HCC)    Sick-euthyroid syndrome    Enterocutaneous fistula    Abscess    Poor nutrition    Anemia of chronic disease  Resolved Problems:    * No resolved hospital problems. *      HPI: Colleen Russo is a 46 y.o. 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    Recently discharged from admission 1/14 - 2/10 due to recurrent small bowel obstruction, enterocutaneous fistula with abscess formation and persistent nausea/vomiting and poor nutrition.  She was discharged on 20 units of NPH with TPN as well as octreotide and dexamethasone with taper schedule.  Appears she has been taking dexamethasone 1 mg daily.  She states she took 20 units of NPH the first night after discharge, was hypoglycemic the next day, and then got in touch with pharmacy who decreased her dose to 10 units of NPH.  She was then persistently hypoglycemic to the 30s and 40s, and was maintained on sliding scale only.  She continued to have symptomatic hypoglycemia with presyncope.  At this time, she had been more than 48 hours without any long-acting insulin.  She called EMS x2, did not go to the hospital the first time and on the second time came to the ED where she was found to have a glucose of 35.    In the ED, her vital signs were stable and she was treated with D50 bolus x4 and started on D10/half-normal, as well as stress dose steroids.  Has stabilized sugars on these interventions, but endocrinology recommending every hour glucose checks, thus is being admitted to the MICU.    Neurological   Chronic??Pain, S/p??Palliative??Care??consult, pain well??controlled  -50 mcg fentanyl patches and??Tylenol PRN    Pulmonary   NAI    Cardiovascular   NAI    Renal NAI    Infectious Disease/Autoimmune   Enterocutaneous Fistula - Hx abscesses, History of enterocutaneous fistula,??and abscesses requiring drainage.Most recently, abscess drained by VIR 2/7, initially on??Zosyn.  No concerns for active infection at the moment.  -continues on??Levofloxacin and Flagyl until 2/19   -f/u blood cultures x2 (2/18)     FEN/GI   Poor Nutrition  - Recommend Nutrition consult in the AM  - Plan to continue TPN once hypoglycemia eval completed    Chronic small bowel obstruction??- TEG Tolerating clears and bites of food at home.  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  Has a venting G-tube.  -- Primary Oncologist: [name]    Malnutrition Assessment: Not done yet.         Heme/Coag   Anemia??of Chronic Disease   - Admit Hgb 7.9 stable from prior    Endocrine   Persistent symptomatic hypoglycemia, presented with blood glucose 35, requiring multiple interventions.  Has been without long-acting insulin ~72 hours.  Differential includes insulin stacking versus adrenal insufficiency versus pathological insulin/IGF secretion.  Endocrine following and recommending q1 hour glucose checks x4hrs and then q2hr onward.  Endocrine okay with [facility] admission and stepdown status.   - Endocrine consulted  -  Hold D10 gtt - attempting to induce hypoglycemia in order to obtain labs (see below).   - Decrease hydrocortisone to 25 mg q 6 hrs (was originally on 50mg  q6h)  - Glucose checks every 1 hour x 4hrs then okay to check q2hrs per Endocrine so patient can be admitted to [facility]  - if patient has POC < 50 mg/dL, before giving Z61, check serum BG, insulin, pro-insulin, c-peptide, insulin antibody, beta-hydroxy-butyrate, cortisol, ACTH, glucagon, IGF-1, IGF-2 and sulfonylurea screen. (Orders pended)    Prophylaxis/LDA/Restraints/Consults   Can CVC be removed? N/A, no CVC present (including vascular catheter for HD or PLEX)   Can A-line be removed? N/A, no A-line present  Can Foley be removed? N/A, no Foley present  Mobility plan: Step 6 - Ambulate in hallway    Feeding: NPO, resume TPN once hypoglycemia evaluation completed.   Analgesia: Pain adequately controlled  Sedation SAT/SBT: N/A  Thrombembolic ppx: Enoxaparin  Head of bed: >30 deg  Ulcer ppx: Not indicated  Glucose within target range: Not in range, titrating medications    RASS at goal? N/A, not on sedation      Can antipsychotics be stopped? No: Continuing home medication.        Patient Lines/Drains/Airways Status     Active Active Lines, Drains, & Airways     Name Placement date Placement time Site Days    Closed/Suction Drain Left LUQ Bulb 10 Fr. 09/10/21  1104  LUQ  10    Open Drain 1 Anterior;Left Neck 09/06/21  --  Neck  15    PICC Triple Lumen 08/21/21 Left Basilic 08/21/21  1332  Basilic  30    Peripheral IV 09/60/45 Anterior;Proximal;Right Forearm 09/20/21  1846  Forearm  less than 1              Patient Lines/Drains/Airways Status     Active Wounds     Name Placement date Placement time Site Days    Wound 08/19/21 Abdomen Mid open skin wound 08/19/21  --  Abdomen  33                Goals of Care     Code Status: Full Code    Designated Healthcare Decision Maker:  Ms. Eagles currently has decisional capacity for healthcare decision-making and is able to designate a surrogate healthcare decision maker. Ms. Dias designated healthcare decision maker(s) is/are Valerie Salts (the patient's spouse) as denoted by stated patient preference.      Subjective     HPI:  Colleen Russo is a 46 y.o. 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    See above for HPI    ROS: Ten-point review of systems is obtained and is negative except as noted in HPI.    Allergies  Allergies   Allergen Reactions   ??? Carboplatin Shortness Of Breath   ??? Ketamine Other (See Comments)     Rigors, nausea       Meds  No current facility-administered medications on file prior to encounter.     Current Outpatient Medications on File Prior to Encounter   Medication Sig   ??? acetaminophen (TYLENOL) 325 MG tablet Take 2 tablets (650 mg total) by mouth every six (6) hours as needed.   ??? bisacodyL (DULCOLAX) 10 mg suppository Insert 1 suppository (10 mg total) into the rectum daily as needed.   ??? calcium carbonate (TUMS) 200 mg calcium (500  mg) chewable tablet Chew 1 tablet (200 mg of elem calcium total) Three (3) times a day as needed.   ??? [START ON 09/24/2021] cyclophosphamide (CYTOXAN) 50 mg capsule Take 1 capsule (50 mg total) by mouth daily . Do not start until discussing with your oncologist   ??? dexAMETHasone (DECADRON) 1 MG tablet Take 2 tablets (2 mg total) by mouth daily for 3 days, THEN 1 tablet (1 mg total) daily for 7 days.   ??? docusate sodium (COLACE) 100 MG capsule Take 100 mg by mouth daily.   ??? famotidine (PEPCID) 20 MG tablet Take 20 mg by mouth daily.   ??? fentaNYL (DURAGESIC) 50 mcg/hr patch Place 1 patch on the skin every third day.   ??? glucose 4 GM chewable tablet Chew 4 tablets (16 g total) every ten (10) minutes as needed for low blood sugar ((For Blood Glucose LESS than 70 mg/dL and GREATER than or EQUAL to 54 mg/dL and able to speak clearly and able to swallow/NOT NPO.)).   ??? hydrOXYzine (ATARAX) 25 MG tablet Take 1 tablet (25 mg total) by mouth every six (6) hours as needed for anxiety for up to 14 days.   ??? insulin lispro (HUMALOG) 100 unit/mL injection pen Inject 0-0.2 mL (0-20 Units total) under the skin every six (6) hours as needed. Administer number of insulin units based on your blood glucose: BG 160-200 = 1 unit BG 201-240 = 4 units BG 241-280 = 6 units BG 281-300 = 8 units BG > 300 = 10 units and notify provider ??? insulin NPH (HUMULIN N NPH U-100 INSULIN) 100 unit/mL injection Inject 20 Units under the skin daily. Inject 1 hour prior to starting TPN infusion   ??? levoFLOXacin (LEVAQUIN) 750 MG tablet Take 1 tablet (750 mg total) by mouth daily for 9 days.   ??? lidocaine 4 % patch Place 1 patch on the skin daily. Apply to affected area for 12 hours only each day (then remove patch).   ??? metoclopramide (REGLAN) 10 MG tablet Take 10 mg by mouth every eight (8) hours.   ??? metroNIDAZOLE (FLAGYL) 500 MG tablet Take 1 tablet (500 mg total) by mouth Three (3) times a day for 10 days.   ??? [EXPIRED] MORPhine 20 mg/mL concentrated solution Take 0.25 mL (5 mg total) by mouth every four (4) hours as needed for up to 5 days.   ??? multivitamin therapeutic with minerals (THERA-M) 27-0.4 mg Tab Take 1 tablet by mouth daily.   ??? OLANZapine (ZYPREXA) 2.5 MG tablet Take 1 tablet (2.5 mg total) by mouth daily.   ??? OLANZapine (ZYPREXA) 5 MG tablet Take 1 tablet (5 mg total) by mouth nightly.   ??? omeprazole (PRILOSEC) 40 MG capsule Take 40 mg by mouth daily.   ??? polyethylene glycol (GLYCOLAX) 17 gram/dose powder Take 17 g by mouth daily.   ??? prochlorperazine (COMPAZINE) 10 MG tablet Take 10 mg by mouth every eight (8) hours as needed.   ??? scopolamine (TRANSDERM-SCOP) 1 mg over 3 days Place 1 patch on the skin every third day.   ??? senna (SENOKOT) 8.6 mg tablet Take 2 tablets by mouth two (2) times a day as needed for constipation.   ??? simethicone (MYLICON) 80 MG chewable tablet Chew 1 tablet (80 mg total) every six (6) hours as needed.   ??? sodium chloride (NS) 0.9 % injection Use 5 mL by Intra-cannular route daily.   ??? zinc oxide-cod liver oil (DESITIN 40%) 40 % Pste Apply topically daily.   ??? [  DISCONTINUED] fentaNYL (DURAGESIC) 50 mcg/hr patch Place 1 new patch on the skin every third day (every 72 hours). Remove old patch before applying new one. Please discard old patch after folding sticky ends together.       Past Medical History  No past medical history on file.    Past Surgical History  Past Surgical History:   Procedure Laterality Date   ??? IR INSERT G-TUBE PERCUTANEOUS  09/06/2021    IR INSERT G-TUBE PERCUTANEOUS 09/06/2021 Gwenlyn Fudge, MD IMG [facility]IR [facility]       Family History  No family history on file.    Social History  Social History     Socioeconomic History   ??? Marital status: Married   Tobacco Use   ??? Smoking status: Never   ??? Smokeless tobacco: Never           Objective     Vitals - past 24 hours  Temp:  [36.6 ??C (97.9 ??F)] 36.6 ??C (97.9 ??F)  Heart Rate:  [78-96] 96  SpO2 Pulse:  [79-94] 94  Resp:  [13-21] 16  BP: (96-125)/(67-85) 114/82  SpO2:  [96 %-100 %] 96 % Intake/Output  No intake/output data recorded.     Physical Exam:    General: Alert and oriented x3, in no acute distress, patient resting comfortably in bed  HEENT: PERRLA  Heart: RRR no murmurs  Lung: CTABL, normal work of breathing  Abd: Soft, non-tender, non-distended, non-tympanic, venting G-tube present on the left, nondraining EC fistula on the right lower  Ext: No edema,   Neuro: Cranial Nerves II-XII grossly intact  Skin: No rashes        Continuous Infusions:   ??? dextrose 10 % and sodium chloride 0.45 % 150 mL/hr (09/21/21 0744)       Scheduled Medications:   ??? enoxaparin (LOVENOX) injection  40 mg Subcutaneous Q24H   ??? fentaNYL  1 patch Transdermal Q72H   ??? hydrocortisone  50 mg Oral Q6H   ??? levoFLOXacin  750 mg Oral Daily   ??? lidocaine  1 patch Transdermal Daily   ??? metroNIDAZOLE  500 mg Oral TID   ??? OLANZapine  2.5 mg Oral Daily   ??? OLANZapine  5 mg Oral Nightly   ??? pantoprazole  40 mg Oral Daily   ??? polyethylene glycol  17 g Oral Daily   ??? scopolamine  1 patch Topical Q72H       PRN medications:  acetaminophen, calcium carbonate, dextrose 50 % in water (D50W), metoclopramide, naloxone, senna, simethicone    Data/Imaging Review: Reviewed in Epic and personally interpreted on 09/21/2021. See EMR for detailed results.      Hubbard Robinson  Internal Medicine PGY-3    Note addendum by Coralee Pesa, PGY2

## 2021-09-21 NOTE — Unmapped (Signed)
[facility]  Emergency Department Medical Screening Examination     Subjective     Colleen Russo is a 46 y.o. female BIB EMS with a history of ovarian cancer and SBO presenting for evaluation of hypoglycemia. Per EMS, the patient has been experiencing episodes on non-diabetic hypoglycemia recently in the setting of TPN treatment. She was initially only responsive to painful stimuli when EMS arrived tonight. Her blood glucose was found to be 56 at that time.      Abbreviated Review of Systems/Covid Screen  Constitutional: Negative for fever  Respiratory: Negative for cough. Negative for difficulty breathing.    Objective     ED Triage Vitals [09/20/21 1654]   Enc Vitals Group      BP 103/77      Heart Rate 87      Resp 20      SpO2 100 %      Focused Physical Exam  Constitutional: No acute distress.  Respiratory: Non-labored respirations.  Neurological: Clear speech. No gross focal neurologic deficits are appreciated.  ?  Assessment & Plan     Appropriate triage orders placed. Patient is hemodynamically stable at this time. Will defer remainder of the work-up to primary team.     A medical screening exam has been performed. At the time of this evaluation, no emergency medical condition requiring immediate stabilization has been identified nor is there suspicion for imminent decompensation. Appropriate triage protocols will be implemented and a comprehensive ED evaluation with disposition will be completed by a healthcare provider when an appropriate ED location becomes available. The patient is aware that this is an initial encounter only and verbalizes understanding and agreement with the plan.     Emergency Department operations continue to be impacted by the COVID-19 pandemic.    Documentation assistance was provided by Johnathan Hausen, Scribe on September 20, 2021 at 4:44 PM for Leim Fabry, MD.    Documentation assistance was provided by the scribe in my presence.  The documentation recorded by the scribe has been reviewed by me and accurately reflects the services I personally performed.

## 2021-09-21 NOTE — Unmapped (Signed)
GYN ONC PROGRESS NOTE    Assessment and Plan:  Colleen Russo is a 46 y.o. 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  - Plan Nutrition consult and TPN order in the AM. Notably, has been getting octreotide in TPN.  - Endocrinology consulted, appreciate recs:  + 09/20/21: Recommend titration of D10 mIVF to BG > 90 mg/dL, initiation of hydrocortisone 50 mg daily and if BG < 50, additional laboratory work up (serum BG, insulin, pro-insulin, c-peptide, insulin antibody, beta-hydroxy-butyrate, cortisol, ACTH, glucagon,??IGF-1, IGF-2??and sulfonylurea screen)  + 09/21/21: Recommend ICU level of care  - Medicine consulted, appreciate recs:  + Recommend higher level of care for careful monitoring    ??  Onc: Recurrent Clear Cell Carcinoma of the Ovary   - Primary Oncologist: [name]  - s/p XL??with??radical tumor debulking including??total hysterectomy,??bilateral salpingo-oophorectomy, infra-gastric??omentectomy, and??washings??on 07/21/2019  -??s/p 6 cycles carbo/taxol, completed 12/05/19  -CT 2/022 concerning for progression, started on carbo/gem 2/22-5/24/22.  -CT 12/2020 stable disease  - 01/23/21:??Diagnostic laparoscopy, exploratory laparotomy, unavoidable enterotomy with repair  -02/2021: noted to have EC fistuala,??OR on 7/2 for incision, exploration, and drainage of subcutaneous abscess??(6x8 cm) lateral to recent ex-lap incision. No definitive succus seen intra-op.??Treated conservatively with bowel rest and TPN for 1 month then had several I&D of recollected abscess at site of EC fistula with drain placed and left until clinical and imaging evidence of resolution????  -12/29-08/07/21 admission for partial SBO and abdominal wall fluid collection, biopsy of fluid collection consistent at first for upper GI or pancreatobiliary primary??  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  - Last CA-125:??180 on 09/06/2021  ??  Sick Euthyroid Syndrome  - Last admit TSH 0.237, FT4 0.54  ??  Small bowel obstruction - TEG  - Denies abdominal pain, nausea, vomiting on admit. Tolerating clears and bites of food at home.  - Continue home scopolamine patch, using Reglan and Compazine PRN  ??  Enterocutaneous Fistula - Hx abscesses  - History of enterocutaneous fistula,??and abscesses requiring drainage.  - Most recently, abscess drained by VIR 2/7, initially on Zosyn > continues on Levofloxacin and Flagyl until 2/19   ??  Chronic Pain  - S/p Palliative Care consult, pain well controlled on 50 mcg fentanyl patches and Tylenol PRN  ??  Poor Nutrition  - Recommend Nutrition consult in the AM  - Plan to continue TPN  ??  Anemia of Chronic Disease   - Admit Hgb 7.9 stable from prior  ??  Prophylaxis: Lovenox  ??  Code Status: full code, confirmed on admission.   ??  Disposition: floor    Plan discussed with Dr. Sherlean Foot. Attending Dr. Ruthe Mannan immediately available.    Subjective:   The patient reports she is feeling better with her blood sugar back up. She denies nausea, vomiting, fever, chills. She no longer feels shaky, flushed and anxious.     Objective:  Heart Rate:  [78-95] 84  SpO2 Pulse:  [79-94] 94  Resp:  [13-21] 14  BP: (96-125)/(67-85) 108/74  SpO2:  [97 %-100 %]  98 %    -General:                    Well-appearing in NAD.  -CV:                             RRR. No m/r/g. Port site without surrounding erythema, induration or drainage.  -Pulm:                         CTAB. Good air movement. Normal WOB.  -Abd:                           Non-tender, non-distended abdomen.  Abdominal incisions well healed. TEG site and JP drain sites without leakage covered by bandage without strike-through. No guarding, no rebound.   -Extremities:              No clubbing, erythema, or edema.   -Psych:                       Appropriate.The patient is awake and alert.    Medications (scheduled)   ??? docusate sodium  100 mg Oral Daily   ??? enoxaparin (LOVENOX) injection  40 mg Subcutaneous Q24H   ??? fentaNYL  1 patch Transdermal Q72H   ??? hydrocortisone  50 mg Oral Q6H   ??? levoFLOXacin  750 mg Oral Daily   ??? lidocaine  1 patch Transdermal Daily   ??? metroNIDAZOLE  500 mg Oral TID   ??? OLANZapine  2.5 mg Oral Daily   ??? OLANZapine  5 mg Oral Nightly   ??? pantoprazole  40 mg Oral Daily   ??? polyethylene glycol  17 g Oral Daily   ??? scopolamine  1 patch Topical Q72H       Medications (prn)  acetaminophen, calcium carbonate, dextrose 50 % in water (D50W), hydrOXYzine, metoclopramide, naloxone, senna, simethicone    Labs:   Lab Results   Component Value Date    WBC 7.8 09/20/2021    HGB 7.9 (L) 09/20/2021    HCT 24.2 (L) 09/20/2021    PLT 263 09/20/2021       Lab Results   Component Value Date    NA 141 09/20/2021    K 3.9 09/20/2021    CL 107 09/20/2021    CO2 27.0 09/20/2021    BUN 14 09/20/2021    CREATININE 0.30 (L) 09/20/2021    GLU 76 09/20/2021    CALCIUM 8.4 (L) 09/20/2021    MG 1.7 09/12/2021    PHOS 4.2 09/12/2021       Lab Results   Component Value Date    BILITOT 0.2 (L) 09/20/2021    BILIDIR 0.10 09/11/2021    PROT 5.2 (L) 09/20/2021    ALBUMIN 2.5 (L) 09/20/2021    ALT 11 09/20/2021    AST 18 09/20/2021    ALKPHOS 61 09/20/2021       Lab Results   Component Value Date    INR 1.16 09/10/2021    APTT 23.4 (L) 09/10/2021

## 2021-09-21 NOTE — Unmapped (Addendum)
Colleen Russo is a 46 y.o. 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 Her hospital course was complicated by the pertinent issues below.      Refractory Hypoglycemia - H/o Type 2 Diabetes   Patient was recently discharged from Gyn Onc service on 09/12/21 with an order for NPH 20 units daily to take one hour prior to her TPN. She observed symptomatic hypoglycemia to 30s at home. She required significant IV dextrose to stabilize her blood glucose. Initial hypoglycemia labs showed elevated insulin levels with low c-peptide concerning for exogenous insulin as etiology. Repeat hypoglycemia labs 48 hours later showed decreased insulin levels with ketones supporting insulin-mediated cause of hypoglycemia, likely due to improper insulin administration with unpredictable absorption and poor renal clearance. In addition, levofloxacin (which she was taking at the time) has also been associated with hypoglycemia. Glucoses stabilized on IV dextrose and she was transitioned back to TPN. She did not have hyperglycemia and was discharged without insulin.      Recurrent clear cell ovarian carcinoma  See H&P for full history. Patient follows with primary oncologist Dr. Pricilla Holm. She previously underwent radical tumor debulking including total hysterectomy, bilateral salpingo-oophorectomy, infra-gastric omentectomy, and washings on 07/21/2019. She received 6 cycles of carbo/taxol, which was completed on 12/05/19. Between 12/29-08/07/21 and 1/14-09/12/21 the patient was admitted for partial SBO and abdominal wall fluid collection. Biopsy of fluid collection consistent at first for upper GI or pancreatobiliary primary, later felt to be metastatic disease. Her last imaging was a CTAP on 09/09/2021 which revealed with SBO, EC fistula and likely peritoneal carcinomatosis. Her last CA-125 was 180 on 09/06/2021. Repeat CTAP this admission with similar findings. Her dexamethasone was tapered over the course of her admission, with plan to stop on 10/05/21 given no long-term indication per Gyn Onc.     Sick euthyroid syndrome  TSH on admission was 0.237 and her FT4 was 0.54.      Chronic small bowel obstruction s/p transesophageal gastrostomy:  On admission, the patient denied abdominal pain, nausea, and vomiting. She previously tolerated clears and small bites of food at home. There was no indication for surgery this admission per Gyn Onc. She was maintained on bowel rest and restarted on TPN as below. Her home scopolamine patch, Reglan PRN, and Compazine PRN were continued. She was also restarted on reduced dose of dronabinol per her request (had previously been discontinued in 08/2021 due to encephalopathy). Her home Zyprexa was also decreased per patient request due to drowsiness.     Enterocutaneous Fistula - History of abscesses  Patient has a history of enterocutaneous fistula and intra-abdominal abscesses requiring drainage, most recently s/p abscess drain placement by VIR 2/7. During prior admission, was treated with Zosyn --> levofloxacin/Flagyl through 09/22/21. CT A/P on current admission showed reduced abdominal wall fluid collection after drain placement. VIR recommended leaving drain in place until [facility] fistula has resolved, despite low drain output, as they expect the fluid collection will return. Ascitic fluid studies and culture were negative for bacterial peritonitis. She has continued to have some cutaneous drainage, and swab from site grew mixed anaerobes and Candida dubliniensis. Initially continued previous levofloxacin/Flagyl, ultimately transitioned to IV cefepime/Flagyl with plan for 4 week course (2/7-10/09/21) and IV fluconazole for Candida for 14 day course (2/23-10/09/21), per ID recommendations.     Polymicrobial bacteremia:   She did not have fever but cultures were drawn in setting of refractory hypoglycemia and  multiple risk factors for serious infection. Peripheral BCx 2/18 grew B cereus and Staph hominis. Repeat peripheral and central line BCx 2/19 grew Staph epi and Staph hominis. Repeat BCx 2/21 no growth. Bacteremia was most likely central line-associated from PICC for TPN vs due to translocation from [facility] fistula. PICC removed 2/22 for line holiday, and new tunneled central line placed 2/24. She completed over 7 days of IV vancomycin (2/18-2/25/23) per ID recommendations, with course complicated by supratherapeutic levels and AKI (level was still therapeutic after day 7 of therapy).     AKI, not POA: Baseline Cr 0.4 and on admission; Cr increased to peak of 2.59 on hospital day 7, most likely due to vancomycin-associated nephrotoxicity (level peaked at 44 on 09/25/21) and/or contrast-induced nephropathy (received IV contrast 09/22/21) and/or prerenal injury in setting of hypoglycemia and acute infection. She maintained adequate urine output but noticed some lower extremity edema that was treated with IV Lasix. Cr improved to *** at discharge.    Chronic pain  Palliative Care was consulted on prior admissions and assisted in managing her pain. Her pain was controlled on fentanyl patches and Tylenol PRN. She was restarted on low-dose dronabinol per her request.     Malnutrition - TPN dependence  Nutrition was consulted and assisted in managing her nutrition regimen. The patient's TPN was continued.    Anemia of chronic disease   The patients admit Hgb was 7.9, which is stable from prior admission. She received pRBC transfusion x1 on 2/24 for Hgb 6.9 with more than appropriate response. Hgb *** on discharge.

## 2021-09-21 NOTE — Unmapped (Signed)
Endocrine Team Diabetes New Consult Note     Consult information:  Requesting Attending Physician : Charna Busman, MD  Service Requesting Consult : Medical ICU (MDI)  Primary Care Provider: Carver Fila, MD  Impression:  Colleen Russo is a 46 y.o. female admitted for Hypolglycemia. We have been consulted at the request of Charna Busman, MD to evaluate Colleen Russo for hyperglycemia.     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   2. Nutrition: Complicating glycemic control. Increasing risk for both hypoglycemia and hyperglycemia.  3. Ovarian cancer . Complicating glycemic control and increasing risk for severe hyperglycemia and severe hypoglycemia      Studies reviewed 09/21/21:  Labs: BMP, CMP, POCT-BG, HbA1C and Abdominal CT  Interpretation: Intact kidney function, serum glucose < 80 with improved from infusion, normal electrolytes, severe hypoglycemia point of care, A1c 7.0 % 09/12/2021, no gross metastasis to the liver on my review of the CT 09/09/2021. No pancreatic mass.   Notes reviewed: Primary team, nursing notes and outside records including MICU note, ED notes and telephone encounter with GYN/ONC team outpatient.       Overall impression based on above reviews and history:  Patient with refractory hypoglycemia of unknown etiology. Our differential is broad. Patient has intact kidney function and physical findings concerning for insulin delayed absorption, making insulin stacking less likely; specially since she has been of insulin products since evening of 2/16. Her response to oral simple sugar load from 40 to 300 mg/dL is extremely unusual, which made [place] think there might have been and error in the measuring, but hospital course with our calibrated machines argues against that. Her history of exposure to steroids, would put her at risk for developing 2/2 adrenal insufficiency. Although we expect her 1 mg of dexamethasone dose to provide enough glucorticoid support to prevent hypoglycemia, but one could question if she was even absorbing it. Another thing to comment on is the use of Octreotide in her TPN bag which has been known to cause unpredictable dysglycemia. That said, octreotide is a short acting medicine when not given in [place] form, thus we would expect its effect to have washed out completely. Further down our differential would be low glycogen storages from malnutrition, although TPN should have been taking care of that, but the story of low blood sugars after stopping TPN. Something also to consider, although less likely, would be a pathologic production of endogenous insulin a paraneoplastic or separated neuroendocrine process. The time course of presentation, argues against this; although high dose dexamethasone could have been masking this. Another unlikely scenario, is that her malignancy is responsible for non islet cell mediated hypoglycemia ( high molecular weight IGF-2). Again, time course would make this less likely but is certainly possible. We have yet to arrive at a diagnosis.      She received aggressive measures overnight including stress dose steroids with stabilization of blood glucoses. We will recommend a modified fast by discontinuation of dextrose infusion. If process is still active and not transient, patient should develop hypoglycemia. If this happens we will get blood work to try to arrive at possible diagnosis.     Recommendations:  - Stop Dextrose Infusion and monitor with q 1 hours glucoses.  Stopped at 1113 on 09/21/2021  - decrease HC to 25 mg q 6 hrs   - Please follow the following protocol   - Phase: 1When accucheck is ? 50 AND patient  has symptoms of hypoglycemia (diaphoresis, tremulousness, blurry vision, etc.)  - Check serum BG, insulin, pro-insulin, c-peptide, insulin antibody, beta-hydroxy-butyrate, cortisol, ACTH, glucagon, and sulfonylurea screen  - If serum BG returns ? 55, start Phase 2  - If serum BG does not return ? 50, resume Phase 2  Phase 2: When serum BG ? 55  - Give Glucagon 1mg  SQ x1 (PRN ordered)  - Check serum BG again 15 and 30 minutes after Glucagon given  - Once we finish with these labs patient should be placed back on D10 drip to maintain sugar >90 mg/dL       Thank you for this consult. Discussed plan with primary team. We will continue to follow and make recommendations and place orders as appropriate.    Please page with questions or concerns: Endocrine fellow on call: [phone]    Subjective:  Initial HPI:  Colleen Russo is a 46 y.o. 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 We have been consulted at the request of Charna Busman, MD to evaluate Colleen Russo for hyperglycemia.     Patient was recently discharged from the hospital after spending about 4 weeks for SBO, EC fistula with abscess and nausea/vomiting. During this hospitalization, she developed TPN induced hyperglycemia, which was managed with correctional insulin and NPH. She was discharged in a stable state with TPN and recommendations of NPH 20 units at the start of the TPN. Per documentation, the TPN had no insulin in the bag but did have octreotide. Another thing to mentioned is that patient had been taking Dexamethasone prior to admission and while admitted she was taking up to 8 mg of dexamethasone and was discharge on taper of 2 mg for 3 days, followed by 1 mg for 7 days.     After discharge patient had been having low blood sugar at the end of her TPN. This similar pattern was happening during hospitalization but her glucose decreased to normal range and never had hypoglycemia. Patient started reporting values < 70 mg/dL on 4/54, again at around 9-10 am after he TPN stopped. This episodes where accompanied with some lethargy which got better with candy. Interestly, her blood sugar would be < 70 and with smal piece of candy would jump into the 300s. Patient continued to have episodes an her NPH was discontinued ( after being reduced in half on 2/15) on 2/16. On 6:19 PM patient took 4 units for a BG 295 mg/dL and her blood sugar drop to 159 mg/dL, and that is the last time she had any form of insulin.     Per documentation, octreotide was being use to control fistula output and help with nausea and vomiting.       Diabetes History:  Patient has a history of TPN induced hyperglycemia diagnosed 09/12/2021.  Diabetes is managed by: primary  Current home diabetes regimen: was taking 20 units of NPH at the start of her TPN but recently stopped due to recurrent hypoglycemia.   Current home blood glucose monitoring:  Glucometer   Hypoglycemia awareness: yes   Complications related to diabetes: none known    Current Diabetes Inpatient Regimen:  None     Current Nutrition:  Active Orders   Diet    NPO Sips with meds; Medically necessary         ROS: As per HPI.    ??? enoxaparin (LOVENOX) injection  40 mg Subcutaneous Q24H   ??? fentaNYL  1  patch Transdermal Q72H   ??? hydrocortisone  25 mg Oral Q6H SCH   ??? levoFLOXacin  750 mg Oral Daily   ??? lidocaine  1 patch Transdermal Daily   ??? metroNIDAZOLE  500 mg Oral TID   ??? OLANZapine  2.5 mg Oral Daily   ??? OLANZapine  5 mg Oral Nightly   ??? pantoprazole  40 mg Oral Daily   ??? polyethylene glycol  17 g Oral Daily   ??? scopolamine  1 patch Topical Q72H       Current Outpatient Medications   Medication Instructions   ??? acetaminophen (TYLENOL) 650 mg, Oral, Every 6 hours PRN   ??? bisacodyL (DULCOLAX) 10 mg, Rectal, Daily PRN   ??? calcium carbonate (TUMS) 200 mg calcium (500 mg) chewable tablet 200 mg of elem calcium, Oral, 3 times a day PRN   ??? [START ON 09/24/2021] cycloPHOSphamide (CYTOXAN) 50 mg, Oral, Daily (standard), Do not start until discussing with your oncologist   ??? dexAMETHasone (DECADRON) 1 MG tablet Take 2 tablets (2 mg total) by mouth daily for 3 days, THEN 1 tablet (1 mg total) daily for 7 days.   ??? docusate sodium (COLACE) 100 mg, Oral, Daily (standard)   ??? famotidine (PEPCID) 20 mg, Oral, Daily (standard)   ??? fentaNYL (DURAGESIC) 50 mcg/hr patch 1 patch, Transdermal, Every 72 hours   ??? GAS RELIEF 80 (SIMETHICONE) 80 mg, Oral, Every 6 hours PRN   ??? glucose 16 g, Oral, Every 10 min PRN   ??? hydrOXYzine (ATARAX) 25 mg, Oral, Every 6 hours PRN   ??? insulin lispro (HUMALOG) 100 unit/mL injection pen Inject 0-0.2 mL (0-20 Units total) under the skin every six (6) hours as needed. Administer number of insulin units based on your blood glucose: BG 160-200 = 1 unit BG 201-240 = 4 units BG 241-280 = 6 units BG 281-300 = 8 units BG > 300 = 10 units and notify provider   ??? insulin NPH (HUMULIN N NPH U-100 INSULIN) 100 unit/mL injection Inject 20 Units under the skin daily. Inject 1 hour prior to starting TPN infusion   ??? levoFLOXacin (LEVAQUIN) 750 mg, Oral, Daily (standard)   ??? lidocaine 4 % patch Place 1 patch on the skin daily. Apply to affected area for 12 hours only each day (then remove patch).   ??? metoclopramide (REGLAN) 10 mg, Oral, Every 8 hours   ??? metroNIDAZOLE (FLAGYL) 500 mg, Oral, 3 times a day (standard)   ??? multivitamin therapeutic with minerals (THERA-M) 27-0.4 mg Tab 1 tablet, Oral, Daily (standard)   ??? OLANZapine (ZYPREXA) 5 mg, Oral, Nightly   ??? OLANZapine (ZYPREXA) 2.5 mg, Oral, Daily (standard)   ??? omeprazole (PRILOSEC) 40 mg, Oral, Daily (standard)   ??? polyethylene glycol (GLYCOLAX) 17 g, Oral, Daily (standard)   ??? prochlorperazine (COMPAZINE) 10 mg, Oral, Every 8 hours PRN   ??? scopolamine (TRANSDERM-SCOP) 1 mg over 3 days 1 patch, Transdermal, Every 72 hours   ??? senna (SENOKOT) 8.6 mg tablet 2 tablets, Oral, 2 times a day PRN   ??? sodium chloride (NS) 0.9 % injection Use 5 mL by Intra-cannular route daily.   ??? zinc oxide-cod liver oil (DESITIN 40%) 40 % Pste Topical, Daily (1100)           No past medical history on file.    Past Surgical History:   Procedure Laterality Date   ??? IR INSERT G-TUBE PERCUTANEOUS  09/06/2021    IR INSERT G-TUBE PERCUTANEOUS 09/06/2021 Gwenlyn Fudge, MD  IM[facility]       No family history on file.    Social History     Tobacco Use   ??? Smoking status: Never   ??? Smokeless tobacco: Never       OBJECTIVE:  BP 110/81  - Pulse 112  - Temp 36.7 ??C (98.1 ??F) (Oral)  - Resp 15  - SpO2 99%   Wt Readings from Last 12 Encounters:   09/12/21 64.5 kg (142 lb 4.8 oz)     Physical Exam  Constitutional:       General: She is not in acute distress.     Appearance: She is ill-appearing. She is not toxic-appearing or diaphoretic.      Comments: Chronically ill appearing     Pulmonary:      Effort: Pulmonary effort is normal. No respiratory distress.   Abdominal:      Tenderness: There is no abdominal tenderness.   Musculoskeletal:      Comments: No lumps or insulin wheals on extremities where she injects insulin    Neurological:      General: No focal deficit present.      Mental Status: She is oriented to person, place, and time. Mental status is at baseline.   Psychiatric:         Mood and Affect: Mood normal.         Behavior: Behavior normal.         Thought Content: Thought content normal.             Glucose summary.   Glucose, POC (mg/dL)   Date Value   16/05/9603 50 (L)   09/21/2021 85   09/21/2021 89   09/21/2021 166   09/21/2021 149   09/21/2021 117   09/21/2021 90   09/21/2021 87        Summary of labs:  Lab Results   Component Value Date    A1C 7.0 (H) 09/12/2021     Lab Results   Component Value Date    CREATININE 0.33 (L) 09/21/2021     Lab Results   Component Value Date    WBC 7.6 09/21/2021    HGB 8.3 (L) 09/21/2021    HCT 24.9 (L) 09/21/2021    PLT 275 09/21/2021       Lab Results   Component Value Date    NA 136 09/21/2021    K 4.5 09/21/2021    CL 105 09/21/2021    CO2 25.0 09/21/2021    BUN 9 09/21/2021    CREATININE 0.33 (L) 09/21/2021    GLU 174 09/21/2021    GLU 165 09/21/2021    CALCIUM 8.2 (L) 09/21/2021    MG 1.5 (L) 09/21/2021    PHOS 3.3 09/21/2021 Lab Results   Component Value Date    BILITOT 0.3 09/21/2021    BILIDIR 0.10 09/11/2021    PROT 5.1 (L) 09/21/2021    ALBUMIN 2.4 (L) 09/21/2021    ALT 10 09/21/2021    AST 16 09/21/2021    ALKPHOS 61 09/21/2021       Lab Results   Component Value Date    INR 1.14 09/21/2021    APTT 30.7 09/21/2021

## 2021-09-21 NOTE — Unmapped (Signed)
Diabetes Care Team New Patient  Treatment Plan  **This patient was not seen in person today.**    Full consult will be completed face-to-face or virtually within 24 hours.    IMPRESSION:  Colleen Russo is a 46 y.o. female admitted for No Principal Problem: There is no principal problem currently on the Problem List. Please update the Problem List and refresh.. We have been consulted at the request of Carver Fila, MD to evaluate Colleen Russo for hyperglycemia. As this is after our typical hours the chart including glucose trends, insulin doses, nutrition, labs and medications have been reviewed. Based on this review are recommendations are noted below.     RECOMMENDATIONS:  1. Symptomatic Hypoglycemia   - Agree with D10 drip rate per primary aim for BG > 90 mg/dL   - Start hydrocortisone 50 mg q 6 hrs   - Glucose chcecks every 1 hour  - if despite above interventions fail and patient has POC < 50 mg/dL, before giving Z61, check serum BG, insulin, pro-insulin, c-peptide, insulin antibody, beta-hydroxy-butyrate, cortisol, ACTH, glucagon, IGF-1, IGF-2 and sulfonylurea screen.     Other problems complicating glycemic control:  Severe illness: ovarian CA    Thank you for this consult.  Recommendations communicated to team.    Please page with questions or concerns: Endocrine Fellow: [phone] from 6AM - 3PM on weekdays or endocrine fellow on call: [phone] from 3PM - 6AM on weekdays or on weekends and holidays. If APP cannot be reached, please page the endocrine fellow on call.

## 2021-09-22 LAB — COMPREHENSIVE METABOLIC PANEL
ALBUMIN: 2.3 g/dL — ABNORMAL LOW (ref 3.4–5.0)
ALKALINE PHOSPHATASE: 58 U/L (ref 46–116)
ALT (SGPT): 8 U/L — ABNORMAL LOW (ref 10–49)
ANION GAP: 8 mmol/L (ref 5–14)
AST (SGOT): 15 U/L (ref <=34)
BILIRUBIN TOTAL: 0.3 mg/dL (ref 0.3–1.2)
BLOOD UREA NITROGEN: 9 mg/dL (ref 9–23)
BUN / CREAT RATIO: 20
CALCIUM: 8.7 mg/dL (ref 8.7–10.4)
CHLORIDE: 102 mmol/L (ref 98–107)
CO2: 26 mmol/L (ref 20.0–31.0)
CREATININE: 0.45 mg/dL — ABNORMAL LOW
EGFR CKD-EPI (2021) FEMALE: >90 mL/min/{1.73_m2} (ref >=60)
GLUCOSE RANDOM: 206 mg/dL — ABNORMAL HIGH (ref 70–179)
POTASSIUM: 3.9 mmol/L (ref 3.4–4.8)
PROTEIN TOTAL: 5 g/dL — ABNORMAL LOW (ref 5.7–8.2)
SODIUM: 136 mmol/L (ref 135–145)

## 2021-09-22 LAB — CBC
HEMATOCRIT: 24.2 % — ABNORMAL LOW (ref 34.0–44.0)
HEMATOCRIT: 23.9 % — ABNORMAL LOW (ref 34.0–44.0)
HEMOGLOBIN: 7.9 g/dL — ABNORMAL LOW (ref 11.3–14.9)
HEMOGLOBIN: 8 g/dL — ABNORMAL LOW (ref 11.3–14.9)
MEAN CORPUSCULAR HEMOGLOBIN CONC: 32.8 g/dL (ref 32.0–36.0)
MEAN CORPUSCULAR HEMOGLOBIN CONC: 33.4 g/dL (ref 32.0–36.0)
MEAN CORPUSCULAR HEMOGLOBIN: 27.3 pg (ref 25.9–32.4)
MEAN CORPUSCULAR HEMOGLOBIN: 27.5 pg (ref 25.9–32.4)
MEAN CORPUSCULAR VOLUME: 83.2 fL (ref 77.6–95.7)
MEAN CORPUSCULAR VOLUME: 82.2 fL (ref 77.6–95.7)
MEAN PLATELET VOLUME: 7.7 fL (ref 6.8–10.7)
MEAN PLATELET VOLUME: 7.1 fL (ref 6.8–10.7)
PLATELET COUNT: 296 10*9/L (ref 150–450)
PLATELET COUNT: 290 10*9/L (ref 150–450)
RED BLOOD CELL COUNT: 2.9 10*12/L — ABNORMAL LOW (ref 3.95–5.13)
RED BLOOD CELL COUNT: 2.91 10*12/L — ABNORMAL LOW (ref 3.95–5.13)
RED CELL DISTRIBUTION WIDTH: 17.1 % — ABNORMAL HIGH (ref 12.2–15.2)
RED CELL DISTRIBUTION WIDTH: 16.7 % — ABNORMAL HIGH (ref 12.2–15.2)
WBC ADJUSTED: 8.3 10*9/L (ref 3.6–11.2)
WBC ADJUSTED: 6 10*9/L (ref 3.6–11.2)

## 2021-09-22 LAB — GLUCOSE, RANDOM
GLUCOSE RANDOM: 206 mg/dL — ABNORMAL HIGH (ref 70–179)
GLUCOSE RANDOM: 165 mg/dL (ref 70–179)
GLUCOSE RANDOM: 194 mg/dL — ABNORMAL HIGH (ref 70–179)
GLUCOSE RANDOM: 382 mg/dL — ABNORMAL HIGH (ref 70–179)

## 2021-09-22 LAB — BASIC METABOLIC PANEL
ANION GAP: 6 mmol/L (ref 5–14)
BLOOD UREA NITROGEN: 6 mg/dL — ABNORMAL LOW (ref 9–23)
BUN / CREAT RATIO: 13
CALCIUM: 8.6 mg/dL — ABNORMAL LOW (ref 8.7–10.4)
CHLORIDE: 102 mmol/L (ref 98–107)
CO2: 27 mmol/L (ref 20.0–31.0)
CREATININE: 0.45 mg/dL — ABNORMAL LOW
EGFR CKD-EPI (2021) FEMALE: >90 mL/min/{1.73_m2} (ref >=60)
GLUCOSE RANDOM: 382 mg/dL — ABNORMAL HIGH (ref 70–179)
POTASSIUM: 3.8 mmol/L (ref 3.4–4.8)
SODIUM: 135 mmol/L (ref 135–145)

## 2021-09-22 LAB — MAGNESIUM: MAGNESIUM: 1.7 mg/dL (ref 1.6–2.6)

## 2021-09-22 LAB — PHOSPHORUS: PHOSPHORUS: 4.2 mg/dL (ref 2.4–5.1)

## 2021-09-22 LAB — LACTATE, VENOUS, WHOLE BLOOD: LACTATE BLOOD VENOUS: 1.1 mmol/L (ref 0.5–1.8)

## 2021-09-22 MED ADMIN — vancomycin (VANCOCIN) 1250 mg in sodium chloride (NS) 0.9 % 250 mL IVPB (premix): 1250 mg | INTRAVENOUS | @ 05:00:00 | Stop: 2021-10-01

## 2021-09-22 MED ADMIN — enoxaparin (LOVENOX) syringe 40 mg: 40 mg | SUBCUTANEOUS | @ 14:00:00

## 2021-09-22 MED ADMIN — metroNIDAZOLE (FLAGYL) tablet 500 mg: 500 mg | ORAL | @ 19:00:00 | Stop: 2021-09-28

## 2021-09-22 MED ADMIN — OLANZapine (ZyPREXA) tablet 5 mg: 5 mg | ORAL | @ 04:00:00

## 2021-09-22 MED ADMIN — pantoprazole (PROTONIX) EC tablet 40 mg: 40 mg | ORAL | @ 14:00:00

## 2021-09-22 MED ADMIN — dextrose 10 % infusion: 125 mL/h | INTRAVENOUS | @ 10:00:00 | Stop: 2021-09-22

## 2021-09-22 MED ADMIN — hydrocortisone (CORTEF) tablet 25 mg: 25 mg | ORAL | @ 11:00:00 | Stop: 2021-09-22

## 2021-09-22 MED ADMIN — OLANZapine (ZyPREXA) tablet 2.5 mg: 2.5 mg | ORAL | @ 14:00:00

## 2021-09-22 MED ADMIN — dextrose 10 % infusion: 150 mL/h | INTRAVENOUS | @ 03:00:00

## 2021-09-22 MED ADMIN — dextrose 10 % infusion: 100 mL/h | INTRAVENOUS | @ 16:00:00

## 2021-09-22 MED ADMIN — hydrocortisone (CORTEF) tablet 25 mg: 25 mg | ORAL | @ 17:00:00 | Stop: 2021-09-22

## 2021-09-22 MED ADMIN — hydrocortisone (CORTEF) tablet 25 mg: 25 mg | ORAL | @ 06:00:00 | Stop: 2021-09-22

## 2021-09-22 MED ADMIN — metroNIDAZOLE (FLAGYL) tablet 500 mg: 500 mg | ORAL | @ 04:00:00 | Stop: 2021-09-23

## 2021-09-22 MED ADMIN — vancomycin (VANCOCIN) 1250 mg in sodium chloride (NS) 0.9 % 250 mL IVPB (premix): 1250 mg | INTRAVENOUS | @ 15:00:00 | Stop: 2021-10-01

## 2021-09-22 MED ADMIN — hydrocortisone (CORTEF) tablet 25 mg: 25 mg | ORAL

## 2021-09-22 MED ADMIN — levoFLOXacin (LEVAQUIN) tablet 750 mg: 750 mg | ORAL | @ 14:00:00 | Stop: 2021-09-22

## 2021-09-22 MED ADMIN — iohexoL (OMNIPAQUE) 350 mg iodine/mL solution 100 mL: 100 mL | INTRAVENOUS | @ 22:00:00 | Stop: 2021-09-22

## 2021-09-22 MED ADMIN — metroNIDAZOLE (FLAGYL) tablet 500 mg: 500 mg | ORAL | @ 14:00:00 | Stop: 2021-09-28

## 2021-09-22 NOTE — Unmapped (Signed)
Endocrine Team Diabetes Follow Up Consult Note     Consult information:  Requesting Attending Physician : [name], *  Service Requesting Consult : Med [facility])  Primary Care Provider: Carver Fila, MD  Impression:  Colleen Russo is a 46 y.o. female admitted for Hypolglycemia. We have been consulted at the request of [name], * to evaluate Colleen Russo for hyperglycemia.     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   2. Nutrition: Complicating glycemic control. Increasing risk for both hypoglycemia and hyperglycemia.  3. Ovarian cancer . Complicating glycemic control and increasing risk for severe hyperglycemia and severe hypoglycemia      Studies reviewed 09/22/21:  Labs: BMP, POCT-BG, blood cultures and Insulin levels, cotisol, Cpeptide, BHOB  Interpretation: Intact kidney function, patient had POC glucose of 50 yesterday with correpondant serum glucose of < 55 mg/dL. She had Approprietly low C peptide, high insulin levels,  low normal ketones, >30 points increase after glucagon admin.   Notes reviewed: Primary team and nursing notes      Overall impression based on above reviews and history:  She received aggressive measures overnight including stress dose steroids with stabilization of blood glucoses. We recommended a modified fast by discontinuation of dextrose infusion. Stopped at 1113 on 09/21/2021 and at aproximately 1700 patient had POC of 50 with serum of <55. Labs where collected prior to dextrose admin. Patient had glucagon admin with monitor of her BG 15 and 30 min after. She was found to have approprietly low C peptide, high insulin levels,  low normal ketones, >30 points increase after glucagon admin. All of these point to two possibilities: exogenous insulin administration vs a only insulin producing tumor without processing (low c-peptide), which their is no literature on this. If their was a medical error and patient injected more insulin than reported, even if its intermediate acting (NPH), pharmacokinetics can be affected with high volumes of insulin in a subcutaneous space with questionable capacity (minal fat); still this is very odd given she has had no insulin products in the last 48 hrs.        Recommendations:  - Continue dextrose infusion and titrate for blood glucose >90 <120 mg/d for now. Ultimately she would have to come off her D10 drip. Once its stopped if she has blood sugar <50, would repeat the following labs: insulin, c-peptide, serum glucose and beta-hydroxy-butyrate; before dextrose administration. Once labs are collected, would give Glucagon 1 mg subcutaneous followed bu serum glucose checks and 30 min after glucagon administration.   - If decision is to continue dextrose for now at same rate, would space out glucose checks to every 4 hours but if D10 drip rate is change would decrease to every 2 hours to monitor.   - decrease HC to 25 mg q 12 hrs and eventually, we will put her back on dexamethasone 1 mg (aprox. 26 mg of HC). We will defer to GynOnc continuation vs stopping  - Needs TPN evaluation        Thank you for this consult. Discussed plan with primary team. We will continue to follow and make recommendations and place orders as appropriate.    Please page with questions or concerns: Endocrine fellow on call: [phone]    Subjective:  Initial HPI:  Colleen Russo is a 46 y.o. 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  We have been consulted at the request of [name], * to evaluate Colleen Russo for hyperglycemia.     Patient was recently discharged from the hospital after spending about 4 weeks for SBO, EC fistula with abscess and nausea/vomiting. During this hospitalization, she developed TPN induced hyperglycemia, which was managed with correctional insulin and NPH. She was discharged in a stable state with TPN and recommendations of NPH 20 units at the start of the TPN. Per documentation, the TPN had no insulin in the bag but did have octreotide. Another thing to mentioned is that patient had been taking Dexamethasone prior to admission and while admitted she was taking up to 8 mg of dexamethasone and was discharge on taper of 2 mg for 3 days, followed by 1 mg for 7 days.     After discharge patient had been having low blood sugar at the end of her TPN. This similar pattern was happening during hospitalization but her glucose decreased to normal range and never had hypoglycemia. Patient started reporting values < 70 mg/dL on 1/61, again at around 9-10 am after he TPN stopped. This episodes where accompanied with some lethargy which got better with candy. Interestly, her blood sugar would be < 70 and with smal piece of candy would jump into the 300s. Patient continued to have episodes an her NPH was discontinued ( after being reduced in half on 2/15) on 2/16. On 6:19 PM patient took 4 units for a BG 295 mg/dL and her blood sugar drop to 159 mg/dL, and that is the last time she had any form of insulin.     Per documentation, octreotide was being use to control fistula output and help with nausea and vomiting.       Diabetes History:  Patient has a history of TPN induced hyperglycemia diagnosed 09/12/2021.  Diabetes is managed by: primary  Current home diabetes regimen: was taking 20 units of NPH at the start of her TPN but recently stopped due to recurrent hypoglycemia.   Current home blood glucose monitoring:  Glucometer   Hypoglycemia awareness: yes   Complications related to diabetes: none known    Current Diabetes Inpatient Regimen:  None     Current Nutrition:  Active Orders   Diet    NPO Sips with meds; Medically necessary         ROS: As per HPI.    ??? enoxaparin (LOVENOX) injection  40 mg Subcutaneous Q24H   ??? fentaNYL  1 patch Transdermal Q72H   ??? hydrocortisone  25 mg Oral Q6H SCH   ??? levoFLOXacin  750 mg Oral Daily   ??? lidocaine  1 patch Transdermal Daily   ??? metroNIDAZOLE  500 mg Oral TID   ??? OLANZapine  2.5 mg Oral Daily   ??? OLANZapine  5 mg Oral Nightly   ??? pantoprazole  40 mg Oral Daily   ??? polyethylene glycol  17 g Oral Daily   ??? scopolamine  1 patch Topical Q72H   ??? vancomycin  1,250 mg Intravenous Q8H       Current Outpatient Medications   Medication Instructions   ??? acetaminophen (TYLENOL) 650 mg, Oral, Every 6 hours PRN   ??? bisacodyL (DULCOLAX) 10 mg, Rectal, Daily PRN   ??? calcium carbonate (TUMS) 200 mg calcium (500 mg) chewable tablet 200 mg of elem calcium, Oral, 3 times a day PRN   ??? [START ON 09/24/2021] cycloPHOSphamide (CYTOXAN) 50 mg, Oral, Daily (standard), Do not start until discussing with your oncologist   ???  dexAMETHasone (DECADRON) 1 MG tablet Take 2 tablets (2 mg total) by mouth daily for 3 days, THEN 1 tablet (1 mg total) daily for 7 days.   ??? docusate sodium (COLACE) 100 mg, Oral, Daily (standard)   ??? famotidine (PEPCID) 20 mg, Oral, Daily (standard)   ??? fentaNYL (DURAGESIC) 50 mcg/hr patch 1 patch, Transdermal, Every 72 hours   ??? GAS RELIEF 80 (SIMETHICONE) 80 mg, Oral, Every 6 hours PRN   ??? glucose 16 g, Oral, Every 10 min PRN   ??? hydrOXYzine (ATARAX) 25 mg, Oral, Every 6 hours PRN   ??? insulin lispro (HUMALOG) 100 unit/mL injection pen Inject 0-0.2 mL (0-20 Units total) under the skin every six (6) hours as needed. Administer number of insulin units based on your blood glucose: BG 160-200 = 1 unit BG 201-240 = 4 units BG 241-280 = 6 units BG 281-300 = 8 units BG > 300 = 10 units and notify provider   ??? insulin NPH (HUMULIN N NPH U-100 INSULIN) 100 unit/mL injection Inject 20 Units under the skin daily. Inject 1 hour prior to starting TPN infusion   ??? levoFLOXacin (LEVAQUIN) 750 mg, Oral, Daily (standard)   ??? lidocaine 4 % patch Place 1 patch on the skin daily. Apply to affected area for 12 hours only each day (then remove patch).   ??? metoclopramide (REGLAN) 10 mg, Oral, Every 8 hours   ??? metroNIDAZOLE (FLAGYL) 500 mg, Oral, 3 times a day (standard)   ??? multivitamin therapeutic with minerals (THERA-M) 27-0.4 mg Tab 1 tablet, Oral, Daily (standard)   ??? OLANZapine (ZYPREXA) 5 mg, Oral, Nightly   ??? OLANZapine (ZYPREXA) 2.5 mg, Oral, Daily (standard)   ??? omeprazole (PRILOSEC) 40 mg, Oral, Daily (standard)   ??? polyethylene glycol (GLYCOLAX) 17 g, Oral, Daily (standard)   ??? prochlorperazine (COMPAZINE) 10 mg, Oral, Every 8 hours PRN   ??? scopolamine (TRANSDERM-SCOP) 1 mg over 3 days 1 patch, Transdermal, Every 72 hours   ??? senna (SENOKOT) 8.6 mg tablet 2 tablets, Oral, 2 times a day PRN   ??? sodium chloride (NS) 0.9 % injection Use 5 mL by Intra-cannular route daily.   ??? zinc oxide-cod liver oil (DESITIN 40%) 40 % Pste Topical, Daily (1100)           No past medical history on file.    Past Surgical History:   Procedure Laterality Date   ??? IR INSERT G-TUBE PERCUTANEOUS  09/06/2021    IR INSERT G-TUBE PERCUTANEOUS 09/06/2021 Gwenlyn Fudge, MD IMG VIR [facility]V [facility]       No family history on file.    Social History     Tobacco Use   ??? Smoking status: Never   ??? Smokeless tobacco: Never       OBJECTIVE:  BP 105/74  - Pulse 96  - Temp 36.6 ??C (97.9 ??F)  - Resp 18  - SpO2 98%   Wt Readings from Last 12 Encounters:   09/12/21 64.5 kg (142 lb 4.8 oz)     Physical Exam  Constitutional:       General: She is not in acute distress.     Appearance: She is ill-appearing. She is not toxic-appearing or diaphoretic.      Comments: Chronically ill appearing     Pulmonary:      Effort: Pulmonary effort is normal. No respiratory distress.   Abdominal:      Tenderness: There is no abdominal tenderness.   Musculoskeletal:      Comments:  No lumps or insulin wheals on extremities where she injects insulin    Neurological:      General: No focal deficit present.      Mental Status: She is oriented to person, place, and time. Mental status is at baseline.   Psychiatric:         Mood and Affect: Mood normal.         Behavior: Behavior normal.         Thought Content: Thought content normal.             Glucose summary.   Glucose, POC (mg/dL)   Date Value   57/84/6962 133   09/22/2021 129   09/22/2021 142   09/22/2021 118   09/22/2021 80   09/22/2021 162   09/21/2021 50 (L)   09/21/2021 85        Summary of labs:  Lab Results   Component Value Date    A1C 7.0 (H) 09/12/2021     Lab Results   Component Value Date    CREATININE 0.45 (L) 09/22/2021     Lab Results   Component Value Date    WBC 8.3 09/22/2021    HGB 7.9 (L) 09/22/2021    HCT 24.2 (L) 09/22/2021    PLT 296 09/22/2021       Lab Results   Component Value Date    NA 136 09/22/2021    K 3.9 09/22/2021    CL 102 09/22/2021    CO2 26.0 09/22/2021    BUN 9 09/22/2021    CREATININE 0.45 (L) 09/22/2021    GLU 206 (H) 09/22/2021    GLU 206 (H) 09/22/2021    CALCIUM 8.7 09/22/2021    MG 1.7 09/22/2021    PHOS 4.2 09/22/2021       Lab Results   Component Value Date    BILITOT 0.3 09/22/2021    BILIDIR 0.10 09/11/2021    PROT 5.0 (L) 09/22/2021    ALBUMIN 2.3 (L) 09/22/2021    ALT 8 (L) 09/22/2021    AST 15 09/22/2021    ALKPHOS 58 09/22/2021       Lab Results   Component Value Date    INR 1.14 09/21/2021    APTT 30.7 09/21/2021

## 2021-09-22 NOTE — Unmapped (Signed)
Daily Progress Note    Assessment/Plan:    Principal Problem:    Hypoglycemia  Active Problems:    Ovarian cancer (CMS-HCC)    Type 2 diabetes mellitus (CMS-HCC)    Sick-euthyroid syndrome    Enterocutaneous fistula    Abscess    Poor nutrition    Anemia of chronic disease  Resolved Problems:    * No resolved hospital problems. *        Wound 08/19/21 Abdomen Mid open skin wound (Active)   Wound Image    08/27/21 1119   Dressing Status      Dry;Clean 09/12/21 1400   State of Healing Closed wound edges 09/11/21 0800   Wound Length (cm) 0.2 cm 08/27/21 1119   Wound Width (cm) 0.2 cm 08/27/21 1119   Wound Surface Area (cm^2) 0.04 cm^2 08/27/21 1119   Wound Bed Pink 08/26/21 1100   Odor None 09/10/21 2324   Peri-wound Assessment      Clean;Dry;Intact 09/12/21 1400   Exudate Type      Green;Tan 09/10/21 1400   Exudate Amnt      None 09/12/21 1400   Tunneling      No 09/12/21 1400   Undermining     No 09/10/21 1400   Treatments Cleansed/Irrigation 09/08/21 0600   Dressing Abdominal dressing (ABD);Dry gauze 09/12/21 1400           [name] is a 46 y.o. female who presented to [facility] with Hypoglycemia.    Persistent symptomatic hypoglycemia, presented with blood glucose 35, requiring multiple interventions.  Has been without long-acting insulin ~72 hours. Differential includes insulin stacking versus adrenal insufficiency versus pathological insulin/IGF secretion.  - Endocrine consulted, appreciate recs  - D10 gtt held this afternoon in order to obtain diagnostic testing while hypoglycemic  - Hydrocortisone 25 mg q 6 hrs  - Glucose checks every 1 hour x4, then space out to q2hrs  - Stepdown status    Poor Nutrition  -??Recommend??Nutrition consult in the AM  - Plan to continue TPN once hypoglycemia eval completed  ??  Chronic small bowel obstruction??- TEG Tolerating clears and bites of food at home.  - Continue home scopolamine patch, using Reglan PRN  ??  Enterocutaneous Fistula - Hx abscesses, History of enterocutaneous fistula,??and abscesses requiring drainage. Most recently, abscess drained by VIR 2/7, initially on??Zosyn. No concerns for active infection at the moment.  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 Has a venting G-tube.  - Primary Oncologist: [name]  - Appreciate any OB/Gyn recs    Chronic??Pain, S/p??Palliative??Care??consult, pain well??controlled  -50 mcg/hr fentanyl patch and??Tylenol PRN    DVT Ppx: Lovenox  Code Status: Full    Daleen Snook, MD  09/22/21    ___________________________________________________________________    Subjective:  - Feeling a bit sedated, attributes to recent initiation of olanzapine  - Concerned about her low blood glucoses and wants to get to the bottom of this      Recent Results (from the past 24 hour(s))   POCT Glucose    Collection Time: 09/20/21  5:03 PM   Result Value Ref Range    Glucose, POC 33 (LL) 70 - 179 mg/dL   POCT Glucose    Collection Time: 09/20/21  5:31 PM   Result Value Ref Range  Glucose, POC 127 70 - 179 mg/dL   POCT Glucose    Collection Time: 09/20/21  5:34 PM   Result Value Ref Range    Glucose, POC 114 70 - 179 mg/dL   Comprehensive metabolic panel    Collection Time: 09/20/21  5:37 PM   Result Value Ref Range    Sodium 139 135 - 145 mmol/L    Potassium 4.1 3.4 - 4.8 mmol/L    Chloride 107 98 - 107 mmol/L    CO2 27.0 20.0 - 31.0 mmol/L    Anion Gap 5 5 - 14 mmol/L    BUN 12 9 - 23 mg/dL    Creatinine 1.61 (L) 0.60 - 0.80 mg/dL    BUN/Creatinine Ratio 44     eGFR CKD-EPI (2021) Female >90 >=60 mL/min/1.52m2    Glucose 127 70 - 179 mg/dL    Calcium 8.4 (L) 8.7 - 10.4 mg/dL    Albumin 2.4 (L) 3.4 - 5.0 g/dL    Total Protein 5.0 (L) 5.7 - 8.2 g/dL    Total Bilirubin 0.2 (L) 0.3 - 1.2 mg/dL    AST 15 <=09 U/L    ALT 9 (L) 10 - 49 U/L Alkaline Phosphatase 56 46 - 116 U/L   Pregnancy, urine    Collection Time: 09/20/21  5:37 PM   Result Value Ref Range    Pregnancy Test, Urine Negative Negative   Urinalysis with Microscopy with Culture Reflex    Collection Time: 09/20/21  5:37 PM    Specimen: Clean Catch; Urine   Result Value Ref Range    Color, UA Light Yellow     Clarity, UA Turbid     Specific Gravity, UA 1.020 1.003 - 1.030    pH, UA 5.5 5.0 - 9.0    Leukocyte Esterase, UA Small (A) Negative    Nitrite, UA Negative Negative    Protein, UA Negative Negative    Glucose, UA Negative Negative    Ketones, UA Negative Negative    Urobilinogen, UA <2.0 mg/dL <6.0 mg/dL    Bilirubin, UA Negative Negative    Blood, UA Negative Negative    RBC, UA 1 <=4 /HPF    WBC, UA <1 0 - 5 /HPF    Squam Epithel, UA 13 (H) 0 - 5 /HPF    Bacteria, UA Rare (A) None Seen /HPF    Mucus, UA Rare (A) None Seen /HPF   CBC w/ Differential    Collection Time: 09/20/21  5:37 PM   Result Value Ref Range    WBC 7.8 3.6 - 11.2 10*9/L    RBC 2.92 (L) 3.95 - 5.13 10*12/L    HGB 7.9 (L) 11.3 - 14.9 g/dL    HCT 45.4 (L) 09.8 - 44.0 %    MCV 83.0 77.6 - 95.7 fL    MCH 27.2 25.9 - 32.4 pg    MCHC 32.8 32.0 - 36.0 g/dL    RDW 11.9 (H) 14.7 - 15.2 %    MPV 7.7 6.8 - 10.7 fL    Platelet 263 150 - 450 10*9/L    Neutrophils % 87.4 %    Lymphocytes % 8.4 %    Monocytes % 3.9 %    Eosinophils % 0.1 %    Basophils % 0.2 %    Absolute Neutrophils 6.9 1.8 - 7.8 10*9/L    Absolute Lymphocytes 0.7 (L) 1.1 - 3.6 10*9/L    Absolute Monocytes 0.3 0.3 - 0.8 10*9/L    Absolute Eosinophils 0.0  0.0 - 0.5 10*9/L    Absolute Basophils 0.0 0.0 - 0.1 10*9/L    Anisocytosis Slight (A) Not Present   POCT Glucose    Collection Time: 09/20/21  6:28 PM   Result Value Ref Range    Glucose, POC 35 (LL) 70 - 179 mg/dL   POCT Glucose    Collection Time: 09/20/21  7:19 PM   Result Value Ref Range    Glucose, POC 119 70 - 179 mg/dL   POCT Glucose    Collection Time: 09/20/21  8:07 PM   Result Value Ref Range Glucose, POC 102 70 - 179 mg/dL   POCT Glucose    Collection Time: 09/20/21  9:57 PM   Result Value Ref Range    Glucose, POC 51 (L) 70 - 179 mg/dL   POCT Glucose    Collection Time: 09/20/21 11:11 PM   Result Value Ref Range    Glucose, POC 114 70 - 179 mg/dL   [id number] PCR    Collection Time: 09/20/21 11:47 PM    Specimen: Nasopharyngeal Swab   Result Value Ref Range    SARS-CoV-2 PCR Negative Negative   Type and Screen    Collection Time: 09/20/21 11:54 PM   Result Value Ref Range    ABO Grouping O POS     Antibody Screen NEG    Comprehensive Metabolic Panel    Collection Time: 09/20/21 11:54 PM   Result Value Ref Range    Sodium 141 135 - 145 mmol/L    Potassium 3.9 3.4 - 4.8 mmol/L    Chloride 107 98 - 107 mmol/L    CO2 27.0 20.0 - 31.0 mmol/L    Anion Gap 7 5 - 14 mmol/L    BUN 14 9 - 23 mg/dL    Creatinine 4.78 (L) 0.60 - 0.80 mg/dL    BUN/Creatinine Ratio 47     eGFR CKD-EPI (2021) Female >90 >=60 mL/min/1.26m2    Glucose 76 70 - 179 mg/dL    Calcium 8.4 (L) 8.7 - 10.4 mg/dL    Albumin 2.5 (L) 3.4 - 5.0 g/dL    Total Protein 5.2 (L) 5.7 - 8.2 g/dL    Total Bilirubin 0.2 (L) 0.3 - 1.2 mg/dL    AST 18 <=29 U/L    ALT 11 10 - 49 U/L    Alkaline Phosphatase 61 46 - 116 U/L   TSH    Collection Time: 09/20/21 11:54 PM   Result Value Ref Range    TSH 2.926 0.550 - 4.780 uIU/mL   T4, Free    Collection Time: 09/20/21 11:54 PM   Result Value Ref Range    Free T4 0.86 (L) 0.89 - 1.76 ng/dL   T3, Free    Collection Time: 09/20/21 11:54 PM   Result Value Ref Range    T3, Free 2.69 2.30 - 4.20 pg/mL   Cortisol    Collection Time: 09/20/21 11:54 PM   Result Value Ref Range    Cortisol 2.4 See Comment ug/dL   POCT Glucose    Collection Time: 09/21/21 12:36 AM   Result Value Ref Range    Glucose, POC 54 (L) 70 - 179 mg/dL   POCT Glucose    Collection Time: 09/21/21  1:22 AM   Result Value Ref Range    Glucose, POC 55 (L) 70 - 179 mg/dL   POCT Glucose    Collection Time: 09/21/21  2:58 AM   Result Value Ref Range    Glucose, POC 122  70 - 179 mg/dL   POCT Glucose    Collection Time: 09/21/21  4:06 AM   Result Value Ref Range    Glucose, POC 85 70 - 179 mg/dL   POCT Glucose    Collection Time: 09/21/21  5:07 AM   Result Value Ref Range    Glucose, POC 87 70 - 179 mg/dL   POCT Glucose    Collection Time: 09/21/21  6:10 AM   Result Value Ref Range    Glucose, POC 90 70 - 179 mg/dL   POCT Glucose    Collection Time: 09/21/21  7:28 AM   Result Value Ref Range    Glucose, POC 117 70 - 179 mg/dL   POCT Glucose    Collection Time: 09/21/21  9:52 AM   Result Value Ref Range    Glucose, POC 149 70 - 179 mg/dL   POCT Glucose    Collection Time: 09/21/21 11:29 AM   Result Value Ref Range    Glucose, POC 166 70 - 179 mg/dL   CBC    Collection Time: 09/21/21 11:36 AM   Result Value Ref Range    WBC 7.6 3.6 - 11.2 10*9/L    RBC 3.01 (L) 3.95 - 5.13 10*12/L    HGB 8.3 (L) 11.3 - 14.9 g/dL    HCT 16.1 (L) 09.6 - 44.0 %    MCV 82.9 77.6 - 95.7 fL    MCH 27.5 25.9 - 32.4 pg    MCHC 33.1 32.0 - 36.0 g/dL    RDW 04.5 (H) 40.9 - 15.2 %    MPV 7.6 6.8 - 10.7 fL    Platelet 275 150 - 450 10*9/L   Comprehensive Metabolic Panel    Collection Time: 09/21/21 11:36 AM   Result Value Ref Range    Sodium 136 135 - 145 mmol/L    Potassium 4.5 3.4 - 4.8 mmol/L    Chloride 105 98 - 107 mmol/L    CO2 25.0 20.0 - 31.0 mmol/L    Anion Gap 6 5 - 14 mmol/L    BUN 9 9 - 23 mg/dL    Creatinine 8.11 (L) 0.60 - 0.80 mg/dL    BUN/Creatinine Ratio 27     eGFR CKD-EPI (2021) Female >90 >=60 mL/min/1.56m2    Glucose 174 70 - 179 mg/dL    Calcium 8.2 (L) 8.7 - 10.4 mg/dL    Albumin 2.4 (L) 3.4 - 5.0 g/dL    Total Protein 5.1 (L) 5.7 - 8.2 g/dL    Total Bilirubin 0.3 0.3 - 1.2 mg/dL    AST 16 <=91 U/L    ALT 10 10 - 49 U/L    Alkaline Phosphatase 61 46 - 116 U/L   Magnesium Level    Collection Time: 09/21/21 11:36 AM   Result Value Ref Range    Magnesium 1.5 (L) 1.6 - 2.6 mg/dL   Phosphorus Level    Collection Time: 09/21/21 11:36 AM   Result Value Ref Range    Phosphorus 3.3 2.4 - 5.1 mg/dL   PT-INR    Collection Time: 09/21/21 11:36 AM   Result Value Ref Range    PT 13.0 (H) 9.8 - 12.8 sec    INR 1.14    APTT    Collection Time: 09/21/21 11:36 AM   Result Value Ref Range    APTT 30.7 25.1 - 36.5 sec    Heparin Correlation 0.2    Glucose, Random    Collection Time: 09/21/21 11:36 AM  Result Value Ref Range    Glucose 165 70 - 179 mg/dL   POCT Glucose    Collection Time: 09/21/21  1:35 PM   Result Value Ref Range    Glucose, POC 89 70 - 179 mg/dL   POCT Glucose    Collection Time: 09/21/21  2:59 PM   Result Value Ref Range    Glucose, POC 85 70 - 179 mg/dL   POCT Glucose    Collection Time: 09/21/21  4:20 PM   Result Value Ref Range    Glucose, POC 50 (L) 70 - 179 mg/dL     Labs/Studies:  Labs and Studies from the last 24hrs per EMR and Reviewed    Objective:  Temp:  [36.6 ??C (97.9 ??F)-36.7 ??C (98.1 ??F)] 36.7 ??C (98.1 ??F)  Heart Rate:  [84-125] 86  SpO2 Pulse:  [62-124] 103  Resp:  [13-24] 14  BP: (96-129)/(65-90) 113/78  SpO2:  [96 %-100 %] 99 %    CONSTITUTIONAL: Alert, in no acute distress  HEENT: Normocephalic, atraumatic  EYES: Anicteric sclerae, no conjunctival injection  CARDIOVASCULAR: Normal rate, rhythm sounds regular, normal S1/S2, no murmurs, clicks, rubs, gallops. Trace LE edema bilaterally.  PULM: Clear to auscultation bilaterally, normal work of breathing  GASTROINTESTINAL: Soft, nontender, nondistended  SKIN: No visible rashes or lesions  MSK: No obvious deformities  NEUROLOGIC: No focal deficits  PSYCH: appropriate

## 2021-09-22 NOTE — Unmapped (Signed)
Called about 2 of 2 blood cultures positive for GPR/GPC in clusters (no further ID as mixed cultures on both). Plans to add vancomycin to antimicrobial regimen after repeating cultures from lumens of lines and one additional peripheral culture. Consider Vancomycin locks pending species ID. If clinically worsening may move levaquin/metronidazole to carbapenem to cover GPR that do not respond to vancomycin (lactobacillus, etc) and will discuss with day providers.     Damien Fusi, MD MS

## 2021-09-22 NOTE — Unmapped (Signed)
Hospital Medicine Daily Progress Note    Assessment/Plan:    Principal Problem:    Hypoglycemia  Active Problems:    Ovarian cancer (CMS-HCC)    Type 2 diabetes mellitus (CMS-HCC)    Sick-euthyroid syndrome    Enterocutaneous fistula    Abscess    Poor nutrition    Anemia of chronic disease  Resolved Problems:    * No resolved hospital problems. *        Wound 08/19/21 Abdomen Mid open skin wound (Active)   Wound Image    08/27/21 1119   Dressing Status      Dry;Clean 09/12/21 1400   State of Healing Closed wound edges 09/11/21 0800   Wound Length (cm) 0.2 cm 08/27/21 1119   Wound Width (cm) 0.2 cm 08/27/21 1119   Wound Surface Area (cm^2) 0.04 cm^2 08/27/21 1119   Wound Bed Pink 08/26/21 1100   Odor None 09/10/21 2324   Peri-wound Assessment      Clean;Dry;Intact 09/12/21 1400   Exudate Type      Green;Tan 09/10/21 1400   Exudate Amnt      None 09/12/21 1400   Tunneling      No 09/12/21 1400   Undermining     No 09/10/21 1400   Treatments Cleansed/Irrigation 09/08/21 0600   Dressing Abdominal dressing (ABD);Dry gauze 09/12/21 1400           Colleen Russo is a 46 y.o. female who presented to [facility] with Hypoglycemia.    Severe hypoglycemia, symptomatic She had measured blood glucose <55 with typical symptoms. Last reported insulin administration was 2/16. Initial hypoglycemia labs consistent with insulin dependent process either exogenous or endogenous.   - Endocrine consulted, appreciate recs  - Continue D10 infusion but decrease as able to keep BG >90  - She will need repeat fasting hypoglycemia challenge 2/20 with labs if <55 (glucose, insulin, c-peptide, betahydroxybutyrate)  - Hydrocortisone 25 mg q 6 hrs, will wean towards physiologic doses soon  - Stepdown status    Bacteremia She has a left upper extremity PICC for TPN and has peripheral blood cultures growing GPC and GPR. Repeat cultures pending and she is currently on vancomycin. She will ID consult on 2/20 to assist with treatment duration but will start with abdominal imaging to look for source and speciation from current cultures. She will need the PICC removed and new access for TPN placed.   - Continue vancomycin  - Remove PICC once hypoglycemia resolved    Malnutrition secondary to chronic bowel obstruction requiring TPN  - Holding TPN in setting of bacteremia and diagnostic evaluation of hypoglycemia  ??  Chronic small bowel obstruction??- TEG Tolerating clears and bites of food at home.  - Continue home scopolamine patch, using Reglan PRN  ??  Enterocutaneous Fistula - Hx abscesses She has a of enterocutaneous fistula,??and abscesses requiring drainage. Most recently, abscess drained by VIR 2/7 with drain placement, initially on??Zosyn but transitioned to levofloxacin and metronidazole at discharge with expected course until 2/19. She is concerned that drain has had little output and she still feels an area of induration. CT ABD ordered to evaluate for persistent fluid collection and/or other source for bacteremia.  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 She does notice an area of firmness on her abdomen near her prior EC fistula site which should be drained but she reports the drain has not been putting out.     She reports prior history of sepsis from yeast and bacteria in 2022 related to a port.     Recent Results (from the past 24 hour(s))   POCT Glucose    Collection Time: 09/21/21  2:59 PM   Result Value Ref Range    Glucose, POC 85 70 - 179 mg/dL   POCT Glucose    Collection Time: 09/21/21  4:20 PM   Result Value Ref Range    Glucose, POC 50 (L) 70 - 179 mg/dL   Glucose, Random    Collection Time: 09/21/21  4:43 PM   Result Value Ref Range    Glucose 54 (L) 70 - 179 mg/dL   Comprehensive Metabolic Panel    Collection Time: 09/21/21  4:43 PM   Result Value Ref Range    Sodium 139 135 - 145 mmol/L    Potassium 4.2 3.5 - 5.1 mmol/L    Chloride 105 98 - 107 mmol/L    CO2 26.0 20.0 - 31.0 mmol/L    Anion Gap 8 5 - 14 mmol/L    BUN 10 9 - 23 mg/dL    Creatinine 3.32 (L) 0.60 - 0.80 mg/dL    BUN/Creatinine Ratio 26     eGFR CKD-EPI (2021) Female >90 >=60 mL/min/1.3m2    Glucose 53 (L) 70 - 179 mg/dL    Calcium 8.4 (L) 8.7 - 10.4 mg/dL    Albumin 2.2 (L) 3.4 - 5.0 g/dL    Total Protein 5.0 (L) 5.7 - 8.2 g/dL    Total Bilirubin 0.3 0.3 - 1.2 mg/dL    AST 17 <=95 U/L    ALT 12 10 - 49 U/L    Alkaline Phosphatase 66 46 - 116 U/L   Insulin Level, Total    Collection Time: 09/21/21  4:43 PM   Result Value Ref Range    Insulin 54.7 (H) 2.6 - 37.6 mU/L   C-Peptide    Collection Time: 09/21/21  4:43 PM   Result Value Ref Range    C-Peptide 0.11 (L) 0.48 - 5.05 ng/mL   Beta Hydroxybutyrate    Collection Time: 09/21/21  4:43 PM   Result Value Ref Range    BETAHYDRO 0.06 0.02 - 0.27 mmol/L   Cortisol    Collection Time: 09/21/21  4:43 PM   Result Value Ref Range    Cortisol 59.2 See Comment ug/dL   Glucose, Random    Collection Time: 09/21/21  6:09 PM   Result Value Ref Range    Glucose 76 70 - 179 mg/dL   Glucose, Random    Collection Time: 09/21/21  6:19 PM   Result Value Ref Range    Glucose 98 70 - 179 mg/dL   Glucose, Random    Collection Time: 09/21/21  8:23 PM   Result Value Ref Range    Glucose 57 (L) 70 - 179 mg/dL   Glucose, Random    Collection Time: 09/21/21 11:26 PM   Result Value Ref Range    Glucose 108 70 - 179 mg/dL   POCT Glucose    Collection Time: 09/22/21 12:32 AM   Result Value Ref Range    Glucose, POC 162 70 -  179 mg/dL   POCT Glucose    Collection Time: 09/22/21  2:51 AM   Result Value Ref Range    Glucose, POC 80 70 - 179 mg/dL   POCT Glucose    Collection Time: 09/22/21  3:46 AM   Result Value Ref Range    Glucose, POC 118 70 - 179 mg/dL   POCT Glucose    Collection Time: 09/22/21  4:45 AM   Result Value Ref Range    Glucose, POC 142 70 - 179 mg/dL   CBC    Collection Time: 09/22/21  5:54 AM   Result Value Ref Range    WBC 8.3 3.6 - 11.2 10*9/L    RBC 2.90 (L) 3.95 - 5.13 10*12/L    HGB 7.9 (L) 11.3 - 14.9 g/dL    HCT 16.1 (L) 09.6 - 44.0 %    MCV 83.2 77.6 - 95.7 fL    MCH 27.3 25.9 - 32.4 pg    MCHC 32.8 32.0 - 36.0 g/dL    RDW 04.5 (H) 40.9 - 15.2 %    MPV 7.7 6.8 - 10.7 fL    Platelet 296 150 - 450 10*9/L   Comprehensive Metabolic Panel    Collection Time: 09/22/21  5:54 AM   Result Value Ref Range    Sodium 136 135 - 145 mmol/L    Potassium 3.9 3.4 - 4.8 mmol/L    Chloride 102 98 - 107 mmol/L    CO2 26.0 20.0 - 31.0 mmol/L    Anion Gap 8 5 - 14 mmol/L    BUN 9 9 - 23 mg/dL    Creatinine 8.11 (L) 0.60 - 0.80 mg/dL    BUN/Creatinine Ratio 20     eGFR CKD-EPI (2021) Female >90 >=60 mL/min/1.24m2    Glucose 206 (H) 70 - 179 mg/dL    Calcium 8.7 8.7 - 91.4 mg/dL    Albumin 2.3 (L) 3.4 - 5.0 g/dL    Total Protein 5.0 (L) 5.7 - 8.2 g/dL    Total Bilirubin 0.3 0.3 - 1.2 mg/dL    AST 15 <=78 U/L    ALT 8 (L) 10 - 49 U/L    Alkaline Phosphatase 58 46 - 116 U/L   Magnesium Level    Collection Time: 09/22/21  5:54 AM   Result Value Ref Range    Magnesium 1.7 1.6 - 2.6 mg/dL   Phosphorus Level    Collection Time: 09/22/21  5:54 AM   Result Value Ref Range    Phosphorus 4.2 2.4 - 5.1 mg/dL   Glucose, Random    Collection Time: 09/22/21  5:54 AM   Result Value Ref Range    Glucose 206 (H) 70 - 179 mg/dL   POCT Glucose    Collection Time: 09/22/21  6:00 AM   Result Value Ref Range    Glucose, POC 129 70 - 179 mg/dL   POCT Glucose    Collection Time: 09/22/21  6:55 AM   Result Value Ref Range    Glucose, POC 133 70 - 179 mg/dL   Glucose, Random    Collection Time: 09/22/21  9:00 AM   Result Value Ref Range    Glucose 165 70 - 179 mg/dL Labs/Studies:  Labs and Studies from the last 24hrs per EMR and Reviewed    Objective:  Temp:  [36.6 ??C (97.9 ??F)] 36.6 ??C (97.9 ??F)  Heart Rate:  [65-112] 95  SpO2 Pulse:  [62-124] 103  Resp:  [14-18] 17  BP: (102-126)/(65-90) 102/68  SpO2:  [97 %-100 %] 99 %    CONSTITUTIONAL: Alert, in no acute distress  HEENT: Normocephalic, atraumatic  NECK: Left neck with enteral feeding tube for decompression  CARDIOVASCULAR: Normal rate, rhythm sounds regular, no murmur  PULM: Clear to auscultation bilaterally, normal work of breathing  GASTROINTESTINAL: Soft, nondistended, area of subcutaneous firmness above her umbilicus, no active drainage or cellulitis  MSK: No obvious deformities  NEUROLOGIC: No focal deficits    I personally spent 60 minutes face-to-face and non-face-to-face in the care of this patient, which includes all pre, intra, and post visit time on the date of service.  All documented time was specific to the E/M visit and does not include any procedures that may have been performed.

## 2021-09-22 NOTE — Unmapped (Addendum)
Nursing reached out to me regarding low blood sugar and restarting dextrose drip per endocrine. Plans to check q1 hour x 2 then if stable move to step down with q2 hour checks. Plan discussed with [facility] charge and ER nursing.     Damien Fusi, MD MS

## 2021-09-22 NOTE — Unmapped (Signed)
Vancomycin Therapeutic Monitoring Pharmacy Note    Colleen Russo is a 46 y.o. female starting vancomycin. Date of therapy initiation: 09/22/21    Indication: Suspected infection     Prior Dosing Information: None/new initiation     Goals:  Therapeutic Drug Levels  Vancomycin trough goal: 10-20 mg/L    Additional Clinical Monitoring/Outcomes  Renal function, volume status (intake and output)    Results: Not applicable    Wt Readings from Last 1 Encounters:   09/12/21 64.5 kg (142 lb 4.8 oz)     Creatinine   Date Value Ref Range Status   09/21/2021 0.38 (L) 0.60 - 0.80 mg/dL Final   16/05/9603 5.40 (L) 0.60 - 0.80 mg/dL Final   98/06/9146 8.29 (L) 0.60 - 0.80 mg/dL Final        Pharmacokinetic Considerations and Significant Drug Interactions:  ??? Adult (estimated initial): Vd = 45.8 L, ke = 0.183 hr-1  ??? Concurrent nephrotoxic meds: not applicable    Assessment/Plan:  Recommendation(s)  ??? Start vancomycin 1250mg  q8hr  ??? Estimated trough on recommended regimen: 10 mg/L    Follow-up  ??? Level due: prior to fourth or fifth dose  ??? A pharmacist will continue to monitor and order levels as appropriate    Please page service pharmacist with questions/clarifications.    Cephus Slater, PharmD

## 2021-09-22 NOTE — Unmapped (Signed)
Received epic chat from ER nursing staff at 3:00 am (not observed by me until 4:45 am) that patient had been off D10 ~1 hour while she was in with other patient but blood glucose at 80 at recheck. Since has stabilized with glucoses 118, 142. At this time will keep drip running and have nutrition evaluate regarding home TPN as a transition likely tomorrow afternoon if glucoses return to >160 but would have day team discuss plan with endocrine. Requested to be paged about glucose changes or acute events moving forward.     Damien Fusi MD MS

## 2021-09-22 NOTE — Unmapped (Signed)
GYN ONC PROGRESS NOTE    Assessment and Plan:  Colleen Russo is a 46 y.o. 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    Refractory Hypoglycemia - H/o Type 2 Diabetes   - Discharged 2/9 on NPH 20 units daily to take 1 hr prior to TPN > dc'd 2/2 lows to 30s at home  - BG stabilized 2/17 AM after D50 bolus x 4 and 150 ml/hr D10/0.5NS > dropped to low 50's 2/18  - Admit WBC, UA, lytes normal; blood cultures positive for GPC and GPR (see below)  - Management per Primary Team  - Endocrinology following, appreciate recommendations    ??  Enterocutaneous Fistula - Hx abscesses - Bacteremia  - History of enterocutaneous fistula,??and abscesses requiring drainage.  - Most recently, abscess drained by VIR 2/7, initially on Zosyn > continues on Levofloxacin and Flagyl until 2/19   - Admit Blood Cultures w/ GPC and GPR, agree with vancomycin per primary team  - DDx includes EC fistula, superficial abscess, recurrent intraabdominal abscess, malposition of JP drain, PICC colonization  - Recommend repeat imaging with CTAP +/- VIR consultation to evaluate JP drain placement    Onc: Recurrent Clear Cell Carcinoma of the Ovary   - Primary Oncologist: [name]  - s/p XL??with??radical tumor debulking including??total hysterectomy,??bilateral salpingo-oophorectomy, infra-gastric??omentectomy, and??washings??on 07/21/2019  -??s/p 6 cycles carbo/taxol, completed 12/05/19  -CT 2/022 concerning for progression, started on carbo/gem 2/22-5/24/22.  -CT 12/2020 stable disease  - 01/23/21:??Diagnostic laparoscopy, exploratory laparotomy, unavoidable enterotomy with repair  -02/2021: noted to have EC fistuala,??OR on 7/2 for incision, exploration, and drainage of subcutaneous abscess??(6x8 cm) lateral to recent ex-lap incision. No definitive succus seen intra-op.??Treated conservatively with bowel rest and TPN for 1 month then had several I&D of recollected abscess at site of EC fistula with drain placed and left until clinical and imaging evidence of resolution????  -12/29-08/07/21 admission for partial SBO and abdominal wall fluid collection, biopsy of fluid collection consistent at first for upper GI or pancreatobiliary primary??  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  - Last CA-125:??180 on 09/06/2021  ??  Sick Euthyroid Syndrome  - Last admit TSH 0.237, FT4 0.54  ??  Small bowel obstruction - TEG  - Denies abdominal pain, nausea, vomiting on admit. Tolerating clears and bites of food at home.  - Continue home scopolamine patch, using Reglan and Compazine PRN  ??  Chronic Pain  - S/p Palliative Care consult, pain well controlled on 50 mcg fentanyl patches and Tylenol PRN  ??  Poor Nutrition  - S/p Nutrition consult 2/18  - Plan to continue TPN  ??  Anemia of Chronic Disease   - Admit Hgb 7.9 stable from prior  ??  Prophylaxis: Lovenox  ??  Code Status: full code, confirmed on admission.   ??  Disposition: floor    Plan discussed with Dr. Sherlean Foot. Attending Dr. Ruthe Mannan immediately available.    Subjective:   Patient continues to feel the same from yesterday. Denies nausea, vomiting, fever, chills, or abdominal pain. She said she feels as though her JP drain is possibly malpositioned or not draining properly.     Objective:  Temp:  [36.6 ??C (97.9 ??F)-36.7 ??C (98.1 ??F)] 36.6 ??C (97.9 ??F)  Heart Rate:  [65-125] 85  SpO2 Pulse:  [62-124] 103  Resp:  [14-24] 18  BP: (103-129)/(65-90) 105/74  SpO2:  [  96 %-100 %] 98 %    -General:                    Well-appearing in NAD.  -CV:                             RRR. No m/r/g. Port site without surrounding erythema, induration or drainage.  -Chest:                         CTAB. Good air movement. Normal WOB. PICC site without erythema, induration or drainage.   -Abd:                           Non-tender, non-distended abdomen.  Abdominal incisions well healed. TEG site and JP drain sites without leakage covered by bandage without strike-through. No guarding, no rebound. JP bulb with clear fluid. EC site palpated, no discharge, erythema or induration.   -Extremities:              No clubbing, erythema, or edema.   -Psych:                       Appropriate.The patient is awake and alert.    Medications (scheduled)   ??? enoxaparin (LOVENOX) injection  40 mg Subcutaneous Q24H   ??? fentaNYL  1 patch Transdermal Q72H   ??? hydrocortisone  25 mg Oral Q6H SCH   ??? levoFLOXacin  750 mg Oral Daily   ??? lidocaine  1 patch Transdermal Daily   ??? metroNIDAZOLE  500 mg Oral TID   ??? OLANZapine  2.5 mg Oral Daily   ??? OLANZapine  5 mg Oral Nightly   ??? pantoprazole  40 mg Oral Daily   ??? polyethylene glycol  17 g Oral Daily   ??? scopolamine  1 patch Topical Q72H   ??? vancomycin  1,250 mg Intravenous Q8H       Medications (prn)  acetaminophen, calcium carbonate, metoclopramide, naloxone, senna, simethicone    Labs:   Lab Results   Component Value Date    WBC 7.6 09/21/2021    HGB 8.3 (L) 09/21/2021    HCT 24.9 (L) 09/21/2021    PLT 275 09/21/2021       Lab Results   Component Value Date    NA 139 09/21/2021    K 4.2 09/21/2021    CL 105 09/21/2021    CO2 26.0 09/21/2021    BUN 10 09/21/2021    CREATININE 0.38 (L) 09/21/2021    GLU 108 09/21/2021    CALCIUM 8.4 (L) 09/21/2021    MG 1.5 (L) 09/21/2021    PHOS 3.3 09/21/2021       Lab Results   Component Value Date    BILITOT 0.3 09/21/2021    BILIDIR 0.10 09/11/2021    PROT 5.0 (L) 09/21/2021    ALBUMIN 2.2 (L) 09/21/2021    ALT 12 09/21/2021    AST 17 09/21/2021    ALKPHOS 66 09/21/2021       Lab Results   Component Value Date    INR 1.14 09/21/2021    APTT 30.7 09/21/2021     Scribe's Attestation: Hassan Buckler, MD obtained and performed the history, physical exam and medical decision making elements that were  entered into the chart. Documentation assistance was provided by me personally, a scribe. Signed by Toma Aran  [employer], on September 22, 2021 at 4:53 AM.       ----------------------------------------------------------------------------------------------------------------------  September 22, 2021 8:45 AM. Documentation assistance provided by the Scribe. I was present during the time the encounter was recorded. The information recorded by the Scribe was done at my direction and has been reviewed and validated by me.  ----------------------------------------------------------------------------------------------------------------------

## 2021-09-23 LAB — PHOSPHORUS: PHOSPHORUS: 3.5 mg/dL (ref 2.4–5.1)

## 2021-09-23 LAB — COMPREHENSIVE METABOLIC PANEL
ALBUMIN: 2.4 g/dL — ABNORMAL LOW (ref 3.4–5.0)
ALKALINE PHOSPHATASE: 67 U/L (ref 46–116)
ALT (SGPT): 11 U/L (ref 10–49)
ANION GAP: 7 mmol/L (ref 5–14)
AST (SGOT): 15 U/L (ref <=34)
BILIRUBIN TOTAL: 0.2 mg/dL — ABNORMAL LOW (ref 0.3–1.2)
BLOOD UREA NITROGEN: 7 mg/dL — ABNORMAL LOW (ref 9–23)
BUN / CREAT RATIO: 16
CALCIUM: 8.7 mg/dL (ref 8.7–10.4)
CHLORIDE: 104 mmol/L (ref 98–107)
CO2: 26 mmol/L (ref 20.0–31.0)
CREATININE: 0.44 mg/dL — ABNORMAL LOW
EGFR CKD-EPI (2021) FEMALE: >90 mL/min/{1.73_m2} (ref >=60)
GLUCOSE RANDOM: 163 mg/dL (ref 70–179)
POTASSIUM: 3.6 mmol/L (ref 3.4–4.8)
PROTEIN TOTAL: 5.5 g/dL — ABNORMAL LOW (ref 5.7–8.2)
SODIUM: 137 mmol/L (ref 135–145)

## 2021-09-23 LAB — GLUCOSE, RANDOM: GLUCOSE RANDOM: 163 mg/dL (ref 70–179)

## 2021-09-23 LAB — CBC
HEMATOCRIT: 40.4 % (ref 34.0–44.0)
HEMOGLOBIN: 13.4 g/dL (ref 11.3–14.9)
MEAN CORPUSCULAR HEMOGLOBIN CONC: 33.2 g/dL (ref 32.0–36.0)
MEAN CORPUSCULAR HEMOGLOBIN: 27.3 pg (ref 25.9–32.4)
MEAN CORPUSCULAR VOLUME: 82.3 fL (ref 77.6–95.7)
MEAN PLATELET VOLUME: 7.1 fL (ref 6.8–10.7)
PLATELET COUNT: 181 10*9/L (ref 150–450)
RED BLOOD CELL COUNT: 4.91 10*12/L (ref 3.95–5.13)
RED CELL DISTRIBUTION WIDTH: 17.4 % — ABNORMAL HIGH (ref 12.2–15.2)
WBC ADJUSTED: 2.9 10*9/L — ABNORMAL LOW (ref 3.6–11.2)

## 2021-09-23 LAB — REFERRAL LABORATORY TEST, OTHER

## 2021-09-23 LAB — MAGNESIUM: MAGNESIUM: 1.5 mg/dL — ABNORMAL LOW (ref 1.6–2.6)

## 2021-09-23 LAB — LACTATE, VENOUS, WHOLE BLOOD: LACTATE BLOOD VENOUS: 1.3 mmol/L (ref 0.5–1.8)

## 2021-09-23 MED ADMIN — metroNIDAZOLE (FLAGYL) IVPB 500 mg: 500 mg | INTRAVENOUS | @ 14:00:00 | Stop: 2021-09-28

## 2021-09-23 MED ADMIN — hydrocortisone (CORTEF) tablet 25 mg: 25 mg | ORAL | @ 02:00:00

## 2021-09-23 MED ADMIN — lidocaine (LIDODERM) 5 % patch 1 patch: 1 | TRANSDERMAL | @ 09:00:00 | Stop: 2021-09-23

## 2021-09-23 MED ADMIN — OLANZapine (ZyPREXA) tablet 2.5 mg: 2.5 mg | ORAL | @ 13:00:00

## 2021-09-23 MED ADMIN — magnesium sulfate 2gm/50mL IVPB: 2 g | INTRAVENOUS | @ 13:00:00 | Stop: 2021-09-23

## 2021-09-23 MED ADMIN — vancomycin (VANCOCIN) 1250 mg in sodium chloride (NS) 0.9 % 250 mL IVPB (premix): 1250 mg | INTRAVENOUS | @ 08:00:00 | Stop: 2021-10-02

## 2021-09-23 MED ADMIN — metroNIDAZOLE (FLAGYL) tablet 500 mg: 500 mg | ORAL | @ 04:00:00 | Stop: 2021-09-28

## 2021-09-23 MED ADMIN — scopolamine (TRANSDERM-SCOP) 1 mg over 3 days topical patch 1 mg: 1 | TOPICAL | @ 08:00:00

## 2021-09-23 MED ADMIN — pantoprazole (PROTONIX) EC tablet 40 mg: 40 mg | ORAL | @ 13:00:00

## 2021-09-23 MED ADMIN — oxyCODONE (ROXICODONE) immediate release tablet 5 mg: 5 mg | ORAL | @ 08:00:00 | Stop: 2021-09-23

## 2021-09-23 MED ADMIN — hydrocortisone (CORTEF) tablet 25 mg: 25 mg | ORAL | @ 21:00:00

## 2021-09-23 MED ADMIN — vancomycin (VANCOCIN) 1250 mg in sodium chloride (NS) 0.9 % 250 mL IVPB (premix): 1250 mg | INTRAVENOUS | @ 14:00:00 | Stop: 2021-10-02

## 2021-09-23 MED ADMIN — levoFLOXacin (LEVAQUIN) 750 mg/150 mL IVPB 750 mg: 750 mg | INTRAVENOUS | @ 16:00:00 | Stop: 2021-09-28

## 2021-09-23 MED ADMIN — OLANZapine (ZyPREXA) tablet 5 mg: 5 mg | ORAL | @ 04:00:00

## 2021-09-23 MED ADMIN — hydrocortisone (CORTEF) tablet 25 mg: 25 mg | ORAL | @ 16:00:00

## 2021-09-23 MED ADMIN — metroNIDAZOLE (FLAGYL) IVPB 500 mg: 500 mg | INTRAVENOUS | @ 19:00:00 | Stop: 2021-09-28

## 2021-09-23 MED ADMIN — vancomycin (VANCOCIN) 1250 mg in sodium chloride (NS) 0.9 % 250 mL IVPB (premix): 1250 mg | INTRAVENOUS | @ 21:00:00 | Stop: 2021-10-02

## 2021-09-23 MED ADMIN — dextrose 10 % infusion: 100 mL/h | INTRAVENOUS | @ 02:00:00

## 2021-09-23 MED ADMIN — enoxaparin (LOVENOX) syringe 40 mg: 40 mg | SUBCUTANEOUS | @ 13:00:00

## 2021-09-23 MED ADMIN — dextrose 10 % infusion: 50 mL/h | INTRAVENOUS | @ 13:00:00 | Stop: 2021-09-23

## 2021-09-23 NOTE — Unmapped (Signed)
Endocrine Team Diabetes Follow Up Consult Note     Consult information:  Requesting Attending Physician : [name], *  Service Requesting Consult : Med Bernita Raisin [facility])  Primary Care Provider: Carver Fila, MD  Impression:  Colleen Russo is a 46 y.o. female admitted for Hypolglycemia. We have been consulted at the request of [name], * to evaluate [name] for hyperglycemia.     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   2. Nutrition: Complicating glycemic control. Increasing risk for both hypoglycemia and hyperglycemia.  3. Ovarian cancer . Complicating glycemic control and increasing risk for severe hyperglycemia and severe hypoglycemia      Studies reviewed 09/23/21:  Labs: POCT-BG  Interpretation: Patient blood sugar has range 80-160 mg/dL. None < 50 mg/dL.   Notes reviewed: Primary team and nursing notes      Overall impression based on above reviews and history:  She has done well overnight with down titration of D10 drip. At time of evaluation, she was off drip. This ability to down titrate and come off drip, supports the notion that this is a transient thing. We will need to see of dextrose supplementation and be ready to get labs as below, should her blood sugar drop again. We can de-escalate steroids as well.     Recommendations:  - Stop D10 drip. Once its stopped if she has blood sugar <50, would repeat the following labs: insulin, c-peptide, serum glucose and beta-hydroxy-butyrate; before dextrose administration. Once labs are collected, would give Glucagon 1 mg subcutaneous followed bu serum glucose checks and 30 min after glucagon administration.   - If decision is to continue dextrose for now at same rate, would space out glucose checks to every 4 hours but if D10 drip rate is change would decrease to every 2 hours to monitor.   - Continue HC to 25 mg q 12 hrs, starting tomorrow she can start Dexamethasone 1 mg (aprox. 26 mg of HC). We will defer to GynOnc continuation vs stopping  - Needs TPN evaluation         Thank you for this consult. Discussed plan with primary team. We will continue to follow and make recommendations and place orders as appropriate.    Please page with questions or concerns: Endocrine fellow on call: [phone]    Subjective:  Interval history:   Doing well. No major complaints  Overnight. No overnight hypoglycemia.     Initial HPI:  Colleen Russo is a 46 y.o. 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 We have been consulted at the request of [name], * to evaluate [name] for hyperglycemia.     Patient was recently discharged from the hospital after spending about 4 weeks for SBO, EC fistula with abscess and nausea/vomiting. During this hospitalization, she developed TPN induced hyperglycemia, which was managed with correctional insulin and NPH. She was discharged in a stable state with TPN and recommendations of NPH 20 units at the start of the TPN. Per documentation, the TPN had no insulin in the bag but did have octreotide. Another thing to mentioned is that patient had been taking Dexamethasone prior to admission and while admitted she was taking up to 8 mg of dexamethasone and was discharge on taper of 2 mg for 3 days, followed by 1 mg for 7 days.     After discharge patient  had been having low blood sugar at the end of her TPN. This similar pattern was happening during hospitalization but her glucose decreased to normal range and never had hypoglycemia. Patient started reporting values < 70 mg/dL on 1/61, again at around 9-10 am after he TPN stopped. This episodes where accompanied with some lethargy which got better with candy. Interestly, her blood sugar would be < 70 and with smal piece of candy would jump into the 300s. Patient continued to have episodes an her NPH was discontinued ( after being reduced in half on 2/15) on 2/16. On 6:19 PM patient took 4 units for a BG 295 mg/dL and her blood sugar drop to 159 mg/dL, and that is the last time she had any form of insulin.     Per documentation, octreotide was being use to control fistula output and help with nausea and vomiting.     She received aggressive measures overnight 09/21/2021 including stress dose steroids with stabilization of blood glucoses. We recommended a modified fast by discontinuation of dextrose infusion. Stopped at 1113 on 09/21/2021 and at aproximately 1700 patient had POC of 50 with serum of <55. Labs where collected prior to dextrose admin. Patient had glucagon admin with monitor of her BG 15 and 30 min after. She was found to have approprietly low C peptide, high insulin levels,  low normal ketones, >30 points increase after glucagon admin.     Diabetes History:  Patient has a history of TPN induced hyperglycemia diagnosed 09/12/2021.  Diabetes is managed by: primary  Current home diabetes regimen: was taking 20 units of NPH at the start of her TPN but recently stopped due to recurrent hypoglycemia.   Current home blood glucose monitoring:  Glucometer   Hypoglycemia awareness: yes   Complications related to diabetes: none known    Current Diabetes Inpatient Regimen:  None     Current Nutrition:  Active Orders   Diet    NPO Sips with meds; Medically necessary         ROS: As per HPI.    ??? enoxaparin (LOVENOX) injection  40 mg Subcutaneous Q24H   ??? fentaNYL  1 patch Transdermal Q72H   ??? hydrocortisone  25 mg Oral BID   ??? levoFLOXacin  750 mg Oral Daily   ??? lidocaine  1 patch Transdermal Daily   ??? lidocaine  1 patch Transdermal Once   ??? magnesium sulfate  2 g Intravenous Once   ??? metroNIDAZOLE  500 mg Oral TID   ??? OLANZapine  2.5 mg Oral Daily   ??? OLANZapine  5 mg Oral Nightly   ??? pantoprazole  40 mg Oral Daily   ??? polyethylene glycol  17 g Oral Daily   ??? scopolamine  1 patch Topical Q72H   ??? vancomycin 1,250 mg Intravenous Q8H       Current Outpatient Medications   Medication Instructions   ??? acetaminophen (TYLENOL) 650 mg, Oral, Every 6 hours PRN   ??? bisacodyL (DULCOLAX) 10 mg, Rectal, Daily PRN   ??? calcium carbonate (TUMS) 200 mg calcium (500 mg) chewable tablet 200 mg of elem calcium, Oral, 3 times a day PRN   ??? [START ON 09/24/2021] cycloPHOSphamide (CYTOXAN) 50 mg, Oral, Daily (standard), Do not start until discussing with your oncologist   ??? dexAMETHasone (DECADRON) 1 MG tablet Take 2 tablets (2 mg total) by mouth daily for 3 days, THEN 1 tablet (1 mg total) daily for 7 days.   ??? docusate sodium (COLACE) 100 mg,  Oral, Daily (standard)   ??? famotidine (PEPCID) 20 mg, Oral, Daily (standard)   ??? fentaNYL (DURAGESIC) 50 mcg/hr patch 1 patch, Transdermal, Every 72 hours   ??? GAS RELIEF 80 (SIMETHICONE) 80 mg, Oral, Every 6 hours PRN   ??? glucose 16 g, Oral, Every 10 min PRN   ??? hydrOXYzine (ATARAX) 25 mg, Oral, Every 6 hours PRN   ??? insulin lispro (HUMALOG) 100 unit/mL injection pen Inject 0-0.2 mL (0-20 Units total) under the skin every six (6) hours as needed. Administer number of insulin units based on your blood glucose: BG 160-200 = 1 unit BG 201-240 = 4 units BG 241-280 = 6 units BG 281-300 = 8 units BG > 300 = 10 units and notify provider   ??? insulin NPH (HUMULIN N NPH U-100 INSULIN) 100 unit/mL injection Inject 20 Units under the skin daily. Inject 1 hour prior to starting TPN infusion   ??? lidocaine 4 % patch Place 1 patch on the skin daily. Apply to affected area for 12 hours only each day (then remove patch).   ??? metoclopramide (REGLAN) 10 mg, Oral, Every 8 hours   ??? OLANZapine (ZYPREXA) 5 mg, Oral, Nightly   ??? OLANZapine (ZYPREXA) 2.5 mg, Oral, Daily (standard)   ??? omeprazole (PRILOSEC) 40 mg, Oral, Daily (standard)   ??? polyethylene glycol (GLYCOLAX) 17 g, Oral, Daily (standard)   ??? prochlorperazine (COMPAZINE) 10 mg, Oral, Every 8 hours PRN   ??? scopolamine (TRANSDERM-SCOP) 1 mg over 3 days 1 patch, Transdermal, Every 72 hours   ??? senna (SENOKOT) 8.6 mg tablet 2 tablets, Oral, 2 times a day PRN   ??? sodium chloride (NS) 0.9 % injection Use 5 mL by Intra-cannular route daily.           No past medical history on file.    Past Surgical History:   Procedure Laterality Date   ??? IR INSERT G-TUBE PERCUTANEOUS  09/06/2021    IR INSERT G-TUBE PERCUTANEOUS 09/06/2021 Gwenlyn Fudge, MD IMG VIR [facility]V [facility]       No family history on file.    Social History     Tobacco Use   ??? Smoking status: Never   ??? Smokeless tobacco: Never       OBJECTIVE:  BP 116/79  - Pulse 95  - Temp 36.6 ??C (97.9 ??F) (Oral)  - Resp 13  - Ht 157.5 cm (5' 2)  - SpO2 100%  - BMI 26.03 kg/m??   Wt Readings from Last 12 Encounters:   09/12/21 64.5 kg (142 lb 4.8 oz)     Physical Exam  Constitutional:       General: She is not in acute distress.     Appearance: She is ill-appearing. She is not toxic-appearing or diaphoretic.      Comments: Chronically ill appearing     Pulmonary:      Effort: Pulmonary effort is normal. No respiratory distress.   Abdominal:      Tenderness: There is no abdominal tenderness.   Musculoskeletal:      Comments: No lumps or insulin wheals on extremities where she injects insulin    Neurological:      General: No focal deficit present.      Mental Status: She is oriented to person, place, and time. Mental status is at baseline.   Psychiatric:         Mood and Affect: Mood normal.         Behavior: Behavior normal.  Thought Content: Thought content normal.             Glucose summary.   Glucose, POC (mg/dL)   Date Value   16/05/9603 158   09/23/2021 206 (H)   09/22/2021 125   09/22/2021 120   09/22/2021 133   09/22/2021 129   09/22/2021 142   09/22/2021 118        Summary of labs:  Lab Results   Component Value Date    A1C 7.0 (H) 09/12/2021     Lab Results   Component Value Date    CREATININE 0.44 (L) 09/23/2021     Lab Results   Component Value Date    WBC 2.9 (L) 09/23/2021    HGB 13.4 09/23/2021    HCT 40.4 09/23/2021    PLT 181 09/23/2021       Lab Results   Component Value Date    NA 137 09/23/2021    K 3.6 09/23/2021    CL 104 09/23/2021    CO2 26.0 09/23/2021    BUN 7 (L) 09/23/2021    CREATININE 0.44 (L) 09/23/2021    GLU 163 09/23/2021    GLU 163 09/23/2021    CALCIUM 8.7 09/23/2021    MG 1.5 (L) 09/23/2021    PHOS 3.5 09/23/2021       Lab Results   Component Value Date    BILITOT 0.2 (L) 09/23/2021    BILIDIR 0.10 09/11/2021    PROT 5.5 (L) 09/23/2021    ALBUMIN 2.4 (L) 09/23/2021    ALT 11 09/23/2021    AST 15 09/23/2021    ALKPHOS 67 09/23/2021       Lab Results   Component Value Date    INR 1.14 09/21/2021    APTT 30.7 09/21/2021

## 2021-09-23 NOTE — Unmapped (Signed)
GYN ONC Interval Treatment Plan    Colleen Russo is a 46 y.o. 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    Overall patient feels well and has been feeling well. Has been tolerating food and passing gas as well as had a bowel movement as recently as this morning. Notes her JP drain does not drain anything. Her drainage from her incision is increasing and becoming darker yellow. She continues to deny abdominal pain and only notices maybe small increase in bloating. Denies n/v.     O:  Vitals stable and WNL  Abd: Soft, mildly distending, non tender throughout, JP drain w/ clear flush fluid, JP drain site w/out c/f infection, midline incision with copious yellow purulent fluid draining from pinpoint drainage site supraumbilically, not malodorous, normoactive bowel sounds    A/P:  Given extremely reassuring exam and patient status, no indication for surgical intervention at this time. Should her exam or symptoms change or worsen, this would require further goals of care discussion as she would unlikely be a surgical candidate. However, given concerning imaging findings, would recommend true bowel rest with true NPO status. Would also recommend discussing with VIR any available options for sampling of abdominopelvic ascites given signs of peritonitis on imaging for possible source control, however could just be further inflammation. Culture collected from draining abdominal site.

## 2021-09-23 NOTE — Unmapped (Signed)
Division of Infectious Diseases  General Inpatient Consultation Service     For any questions about this consult, page [phone] (Gen C Follow-up Pager).      Colleen Russo is being seen in consultation at the request of [name], MD for evaluation and management of bacteremia and abd wall abscess/enterocutaneous fistula.       PLAN FOR 09/23/2021    Diagnostic  ??? Agree with IR sampling of ascites if possible for cell counts, aerobic/anaerobic culture, and, if  recommended by gyn-onc, cytology  ???  Repeat blood cultures tomorrow AM (48h after vanc starting) to evaluate for clearance given complex picture.    ??? Monitor for antimicrobial toxicity with the following:  ??? Daily CBCs w dif  for now (given leukopenia), bmps at least multiple times per week  ??? Monitor L forearm rash, would stop vancomycin and notify ID if symptoms concerning for serious drug rash develop      Treatment  ??? Continue vancomycin (dosed by pharmacy for goal trough 10-15)   ??? Continue levofloxacin and flagyl (IV at present given SBO)  ??? Remove PICC promptly (though clearly needs alternate access first given hypoglycemia)  ??? Duration of therapy = tbd  o start date = 2/19  o end date = tbd    I discussed the plans for today with primary team, consulting service(s) and patient on 09/23/2021.    Our service will continue to follow.    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         Patient has: [x]  acute illness w/systemic sxs  [mod] [x]  illness posing risk to life or function  [high]   I reviewed:   (3+) [x]  primary team note [x]  consultant note(s) [x]  procedure/op note(s) [x]  micro result(s)    [x]  CBC results [x]  chemistry results [x]  radiology report(s) []  w/indep. historian   I independently visualized:   (any)   []  cxs/plates in lab []  plain film images [x]  CT images []  PET images    []  path slide(s) []  ECG tracing []  MRI images []  nuclear scan   I discussed: (any) []  micro and/or path w/lab personnel []  drug options and/or interactions w/ID pharmD    []  procedure/OR findings w/other MD(s) []  echo and/or imaging w/other MD(s)    [x]  mgm't w/attending(s) involved in case []  setting up home abx w/OPAT team   Mgm't requires: [x]  prescription drug(s)  [mod] [x]  intensive toxicity monitoring  [high]   Minutes: []  35-44    []  45-59    []  60-79    [x]  80+ []  30-74 providing critical care on the unit         1.     # Bacillus cereus, Staph hominis, Staph epi bacteremia   - acute, poses threat to life or bodily function  [high]  Staph hominis now in 3 separate peripheral blood cultures  and 1 blood culture via PICC collected across ~13h on 2/18 and 2/19, clearly a true infection. B. cereus in 2/2 peripheral cultures from 2/18, not growing (yet) on subsequent. Staph epi not in initial cultures but in 2 of 2 (peripheral, PICC) on 2/19, so these also likely represent true bacteremia. Both can be found in the GI tract and also (esp.the Staph) on skin and wounds, so an initial source of the enterocutaneous fistula or elsewhere in the GI tract is most likely, but suspect PICC is now infected as well. We generally treat true bacteremia of each of these pathogens with at least 7 days of IV antibiotics, sometimes up to 14 days. If she were to remain bacteremic on subsequent cultures I would recommend a TTE to evaluate for endocarditis. PICC should be removed promptly.   ??? Continue vancomycin to treat all 3 organisms.   ??? Repeat blood cultures tomorrow AM (48h after vanc starting) to evaluate for clearance. Planned duration will be at least 7 days from first negative culture  ??? Remove PICC promptly (though clearly needs alternate access first given hypoglycemia)    # Enterocutaneous fistula, abdominal wall abscess, possible peritonitis   - acute, poses threat to life or bodily function  [high]  CT and drain output suggest abscess has resolved or nearly resolved. Scant staining on gauze could represent residual abscess fluid or residual fistula, but it is down to drops. New finding of peritoneal changes worrisome for either infection or malignancy; no rebound on exam to suggest peritonitis, however. For now, I would continue levofloxacin since clinically improved on these, and pursue ascites sampling if safe. If present, infectious peritonitis most likely due ongoing bowel leakage and would not necessarily need different antibiotics, but would follow cultures.     # Leukopenia   - acute, undx'd new problem w/uncertain prognosis  [mod]  ??? New today- 2.9 from 6.0. Please repeat CBC tomorrow am w/dif and trend daily.     # forearm rash   - acute, undx'd new problem w/uncertain prognosis  [mod]  ??? Fairly small area, not typical drug rash appearance. Monitor.    # Management of prescription antimicrobials needing intensive toxicity monitoring  Metronidazole can cause rashes, dysgeusia (altered taste), peripheral neuropathy, and/or myelosuppression.  Quinolones can cause rashes, N/V/D, CNS effects, MSK effects, arrhythmias and/or QTc prolongation, glucose abnormalities, and/or neuropathies.  Vancomycin can cause back pain, various dermatological effects, thrombocytopenia, neutropenia, and/or nephrotoxicity.   ??? See recommendations in blue box above.      # Disposition     ??? Likely home once improved. Likely OPAT candidate if prolonged IV antibiotics were to be required.             Antimicrobials & Other Medications   Vanc IV 2/18-  Levofloxacin 750 IV q 24 2/8 -  Flagyl 500 PO q  2/8-    Immunomodulators   Dexamethasone taper 2/10-2/17, hydrocortisone 2/18-present  - last chemo ~9 months ago      Current Medications as of 09/23/2021  Scheduled  PRN   enoxaparin (LOVENOX) injection, 40 mg, Q24H  fentaNYL, 1 patch, Q72H  hydrocortisone, 25 mg, BID  levoFLOXacin, 750 mg, Q24H  lidocaine, 1 patch, Daily  lidocaine, 1 patch, Once  metroNIDAZOLE, 500 mg, Q8H SCH  OLANZapine, 2.5 mg, Daily  OLANZapine, 5 mg, Nightly  pantoprazole, 40 mg, Daily  polyethylene glycol, 17 g, Daily  scopolamine, 1 patch, Q72H  vancomycin, 1,250 mg, Q8H      acetaminophen, 650 mg,  Q6H PRN  calcium carbonate, 200 mg of elem calcium, TID PRN  glucagon, 1 mg, Once PRN  metoclopramide, 10 mg, TID PRN  naloxone, 0.1 mg, Q5 Min PRN  senna, 2 tablet, BID PRN  simethicone, 80 mg, Q6H PRN           Physical Exam     Temp:  [36.6 ??C (97.9 ??F)-37.1 ??C (98.7 ??F)] 36.9 ??C (98.4 ??F)  Heart Rate:  [92-109] 109  SpO2 Pulse:  [89-111] 111  Resp:  [12-27] 15  BP: (105-126)/(63-92) 113/63  MAP (mmHg):  [77-97] 77  SpO2:  [97 %-100 %] 100 %    Actual body weight:    Ideal body weight: 50.1 kg (110 lb 7.2 oz)  Adjusted ideal body weight: 55.9 kg (123 lb 3 oz)      CONST: Vital signs above. Non-toxic appearance, no acute distress  EYES: anicteric sclerae. EOMI.   ENMT: Normal appearance of external nose and ears. OP clear. MMM.   NECK: supple, feeding tube drain entering through L neck without surrounding erythema or visible drainage, trachea midline.  CV: RRR, no m/r/g. No LE edema bilaterally.   PULM: CTAB, no wheezes or rales  ABD: soft,  ND, +BS, mild tenderness to palpation in all 4 quadrants without rebound or guarding. Scarred skin and JP drain exiting mid abdomen with no fluid in tubing or bulb, no erythema or drainage visible on skin or freshly changed gauze.   GU: no urinary catheter in place  SKIN: 1-2 dozen pinpoint, pinkish macules on R forearm. No rashes not elsewhere on extremities, head, trunk (genital area not examined). Skin warm and dry.  . LUE PICC without surrounding cellulitis or drainage.   NEURO: CN II-XII grossly inact. moving all 4 extremities  PSYCH: normal mood and affect. Fully alert and oriented.           Patient Lines/Drains/Airways Status     Active Active Lines, Drains, & Airways     Name Placement date Placement time Site Days    Closed/Suction Drain Left LUQ Bulb 10 Fr. 09/10/21  1104  LUQ  13    Open Drain 1 Anterior;Left Neck 09/06/21  --  Neck  17    PICC Triple Lumen 08/21/21 Left Basilic 08/21/21  1332  Basilic  32                  Data for ID Decision Making  ( IDGENCONMDM )       Micro & Serological Data   ( RSLTMICRO  /  Dorice Lamas  /  00CXSRC  /  00CXRES  /  00CXSUSC )  2/19 bld cx x2: Staph hominis in 2/2 AND Staph epi in 2/2 (Mec A detected)  2/18 bld cx: Bacillus cereus (2/2), Staph hominis (2/2)  2/17 ucx neg  2/7 abd aspirate: gram: 2+ GPR. Cx: 4+ mixed GP/GN orgs, incl. 3+ Citrobacter freundii (R unasyn, aztreo, cefazolin, ceftaz, ctx, I zosyn)), 2+ PsA (pan suscep), 4+ Strep anginosus  1/28 HIV neg  Recent Studies  ( RISRSLT )  2/19 CT abd pelvis w/ IV UJW:[id number]:  ??  1. Persistent dilatation of small bowel loops with several transition points compatible with complex small bowel obstruction with likely closed loop component. There is slight interval increase in enhancement of the affected bowel mucosa. Recommend correlation with lactate and/or surgical consultation for ischemia.  ??  2. Scattered foci of gas in the anterior peritoneum. These do not definitively technique with the enterocutaneous fistula and  may represent focal walled off perforation.  ??  3. New enhancement of peritoneal ascites concerning for peritonitis.  ??  4. Decreased size of gas and fluid containing collection in the left anterior abdominal wall arising from the known enterocutaneous fistula status post drain placement.  ADDENDUM:   (09/22/2021 8:26 PM)  On review, the following additional findings were noted:  ??  Limited exam in the absence of oral contrast.   ??  Redemonstration of a complex enterocutaneous fistula with associated soft tissue stranding at the level of the anterior midline abdomen. There has been interval placement of a pigtail drainage catheter within the previously-described air and fluid collection along the left lower anterior abdominal wall, which is significantly decreased in size when compared to prior exam.  ??  Numerous persistently dilated loops of small bowel are seen throughout the abdomen and pelvis, which demonstrate air-fluid levels and measure up to 3.7 cm in diameter. There is similar tethering of the small bowel along the anterior peritoneum with abrupt collapse at this level, thought likely to reflect a transition point (2:72). The distal small bowel appears mostly decompressed to the level of the cecum. These findings remain worrisome for small bowel obstruction.   ??  Low attenuation abdominopelvic ascites (~18 HU) and diffuse peritoneal thickening/enhancement is similar to prior exam. While these findings, could potentially be seen in the setting of peritonitis, could also be seen with diffuse peritoneal disease spread and associated malignant ascites. Recommend clinical correlation.  ??  Elsewhere throughout the abdomen multiple loops of small demonstrate short segments of wall thickening, hyperenhancement, and luminal narrowing (2:63; 2:94), which may reflect sites of serosal involvement and associated stricturing        Initial Consult Documentation from September 23, 2021     Sources of information include: chart review, patient and treating providers.    History of Present Illness:     Ms. Forget was admitted to [facility] 2/17 with persistent hypoglycemia to 30s despite holding long acting insulin. This started a few days  after discharge home 2/10 from her last admission. Reports she experienced periods of lightheadness, decreased peripheral vision and is told she had slurred speech during these episodes, during which her glucose was found to be low. She would also often feel hot or chilled when her sugar was low, not oat other times. No rigors.     Reports full adherence to levo/flagyl she was taking  for abdominal abscess s/p VIR drainage 2/7. She reports that either as soon as or within a day or two after going home the output into her abdominal/fistula site JP drain stopped, with no drainage (other than the fluid she flushes in) since. She has had very scant greenish drainage from the fistula/abscess exit site area- enough to stain overlying dressing but not enough to soak through or really be visible on the skin. Reports her abdominal pain is unchanged.     Blood cultures were drawn, which grew Bacillus and a coag negative staph in both peripheral blood cultures. She has remained afebrile without infectious symptoms. After initial admission to the MICU, transferred to the floor.     When blood cultures grew, vancomycin was added.     Today, she noticed a R forearm rash with a few dozen small, pinpoint red macules. It is not itchy or painful. No rash or lesions she has noticed elsewhere.         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  -CT 12/2020 stable disease  - 01/23/21:??Diagnostic laparoscopy, exploratory laparotomy, unavoidable enterotomy with repair  -02/2021: noted to have EC fistuala,??OR on 7/2 for incision, exploration, and drainage of subcutaneous abscess??(6x8 cm) lateral to recent ex-lap incision. No definitive succus seen intra-op.??Treated conservatively with bowel rest and TPN for 1 month then had several I&D of recollected abscess at site of EC fistula with drain placed and left until clinical and imaging evidence of resolution????  -12/29-08/07/21 admission for partial SBO and abdominal wall fluid collection, biopsy of fluid collection consistent at first for upper GI or pancreatobiliary primary??  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 09/2021  -IDDM2  -poor nutrition  -anemia of chronic dz  -chronic pain on fentanyl patches    Meds and Allergies  Patient has a current medication list which includes the following prescription(s): acetaminophen, bisacodyl, calcium carbonate, [START ON 09/24/2021] cyclophosphamide, dexamethasone, docusate sodium, famotidine, fentanyl, glucose, hydroxyzine, insulin lispro, insulin nph, lidocaine, metoclopramide, olanzapine, olanzapine, omeprazole, polyethylene glycol, prochlorperazine, scopolamine, senna, simethicone, sodium chloride, and [DISCONTINUED] fentanyl, and the following Facility-Administered Medications: acetaminophen, calcium carbonate, enoxaparin, fentanyl, glucagon, hydrocortisone (CORTEF) tablet 25 mg, levofloxacin, lidocaine, lidocaine, metoclopramide, metronidazole, naloxone, olanzapine, olanzapine, pantoprazole, polyethylene glycol, scopolamine, senna, simethicone, and vancomycin in 0.9 % nacl **AND** Inpatient consult to Pharmacy RX to dose: vancomycin.    Allergies: Carboplatin and Ketamine       Social History  Patient  reports that she has never smoked. She has never used smokeless tobacco. Lives at home.

## 2021-09-23 NOTE — Unmapped (Cosign Needed)
GYN ONC PROGRESS NOTE    Assessment and Plan:  Colleen Russo is a 46 y.o. 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 Currently on dextrose infusion and scheduled hydrocortisone and IV antibiotics. Interval imaging performed given increased discharge from [facility] fistula that shows decreased fluid collection along anterior abdominal wall (s/p IR drainage 2/7), continued dilated loops of bowel with possible obstruction and c/f peritonitis vs peritoneal disease.    Onc: Recurrent Clear Cell Carcinoma of the Ovary   - Primary Oncologist: [name]  - s/p XL??with??radical tumor debulking including??total hysterectomy,??bilateral salpingo-oophorectomy, infra-gastric??omentectomy, and??washings??on 07/21/2019  -??s/p 6 cycles carbo/taxol, completed 12/05/19  -CT 2/022 concerning for progression, started on carbo/gem 2/22-5/24/22.  -CT 12/2020 stable disease  - 01/23/21:??Diagnostic laparoscopy, exploratory laparotomy, unavoidable enterotomy with repair  -02/2021: noted to have EC fistuala,??OR on 7/2 for incision, exploration, and drainage of subcutaneous abscess??(6x8 cm) lateral to recent ex-lap incision. No definitive succus seen intra-op.??Treated conservatively with bowel rest and TPN for 1 month then had several I&D of recollected abscess at site of EC fistula with drain placed and left until clinical and imaging evidence of resolution????  -12/29-08/07/21 admission for partial SBO and abdominal wall fluid collection, biopsy of fluid collection consistent at first for upper GI or pancreatobiliary primary??  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  - Last CA-125:??180 on 09/06/2021    Refractory Hypoglycemia - H/o Type 2 Diabetes   - Discharged 2/9 on NPH 20 units daily to take 1 hr prior to TPN > attempted to decrease as an outpatient and ultimately discontinued given symptomatic lows, last took subq insulin 2/16   - Per Endocrine, unclear etiology  - Management per Primary Team, Endocrine following, appreciate recommendations   - Currently on D10 infusion, Hydrocortisone 25mg  BID, do not anticipate longterm  need for steroids okay to discontinue as appropriate per primary team   - Last hypoglycemic 2/18     Enterocutaneous Fistula - Hx abscesses - Bacteremia  - History of enterocutaneous fistula,??and abscesses requiring drainage.  - Most recently, abscess drain placement by VIR 2/7, initially on Zosyn > s/p Levofloxacin and Flagyl   - Admit BCx d Cultures w/ GPC and GPR, agree with abx per Primary Tem   - Currently on IV Vanc/Flagyl  - DDx includes EC fistula, superficial abscess, recurrent intraabdominal abscess, malposition of JP drain, PICC colonization  - 2/19 repeat CTAP with redemonstration of ECF, decreased air and fluid collection along left lower anterior abdominal wall which is significantly decreased s/p VIR drain 2/7 making new abdominal abscess less likely  - Aerobic/Anaerobic Cx pending  - Agree with ID consult; discuss question of source control and possible drainage of abdominal fluid, possible need for fungal culture    Small bowel obstruction - TEG  - On admit denies abdominal pain, nausea, vomiting on admit. Previously tolerating clears and small bites of food at home   - 2/3 TEG placement  - Home reg: ome scopolamine patch, using Reglan and Compazine PRN  - 2/19 CTAP persistent small bowel obstruction, likely closed loop component with slight interval increase in enhancement of affected bowel mucosa  - Plan for discussion with colorectal for ongoing management of complex obstruction, possible need for surgical management  ??  Sick Euthyroid Syndrome  - Last admit TSH 0.237, FT4 0.54  ????  Chronic Pain  - S/p Palliative Care consult, pain well controlled on 50 mcg fentanyl patches and Tylenol PRN  ??  Poor Nutrition  - S/p Nutrition consult 2/18  - TPN held in setting of bacteremia  - Currently NPO  ??  Anemia of Chronic Disease   - Admit Hgb 7.9 stable from prior  ??  Prophylaxis: Lovenox  ??  Code Status: full code, confirmed on admission.   ??  Disposition: floor    Plan discussed with Dr. Sherlean Foot. Attending Dr. Ruthe Mannan immediately available.    Subjective:   Patient continues to feel fine. Endorsing hunger. No abdominal pain. States having green output from fistula    Objective:  Temp:  [36.9 ??C (98.4 ??F)-37.1 ??C (98.7 ??F)] 36.9 ??C (98.4 ??F)  Heart Rate:  [92-108] 92  SpO2 Pulse:  [96-103] 96  Resp:  [12-27] 12  BP: (102-126)/(68-92) 112/72  SpO2:  [97 %-100 %] 98 %    -General:                    Well-appearing in NAD.  -CV:                             RRR. No m/r/g. Port site without surrounding erythema, induration or drainage.  -Chest:                         CTAB. Good air movement. Normal WOB. PICC site without erythema, induration or drainage.   -Abd:                           Non-tender, non-distended abdomen.  Abdominal incisions well healed. TEG site and JP drain sites without leakage. No ouput in JP drain bulb. EC site palpated, no discharge, erythema or induration.   -Extremities:              No clubbing, erythema, or edema.   -Psych:                       Appropriate.The patient is awake and alert.    Medications (scheduled)   ??? enoxaparin (LOVENOX) injection  40 mg Subcutaneous Q24H   ??? fentaNYL  1 patch Transdermal Q72H   ??? hydrocortisone  25 mg Oral BID   ??? levoFLOXacin  750 mg Oral Daily   ??? lidocaine  1 patch Transdermal Daily   ??? lidocaine  1 patch Transdermal Once   ??? metroNIDAZOLE  500 mg Oral TID   ??? OLANZapine  2.5 mg Oral Daily   ??? OLANZapine  5 mg Oral Nightly   ??? pantoprazole  40 mg Oral Daily   ??? polyethylene glycol  17 g Oral Daily   ??? scopolamine  1 patch Topical Q72H   ??? vancomycin  1,250 mg Intravenous Q8H       Medications (prn)  acetaminophen, calcium carbonate, metoclopramide, naloxone, senna, simethicone    Labs:   Lab Results   Component Value Date    WBC 2.9 (L) 09/23/2021    HGB 13.4 09/23/2021    HCT 40.4 09/23/2021    PLT 181 09/23/2021       Lab Results   Component Value Date    NA 137 09/23/2021    K 3.6 09/23/2021    CL 104 09/23/2021    CO2 26.0 09/23/2021    BUN 7 (L)  09/23/2021    CREATININE 0.44 (L) 09/23/2021    GLU 163 09/23/2021    GLU 163 09/23/2021    CALCIUM 8.7 09/23/2021    MG 1.5 (L) 09/23/2021    PHOS 3.5 09/23/2021       Lab Results   Component Value Date    BILITOT 0.2 (L) 09/23/2021    BILIDIR 0.10 09/11/2021    PROT 5.5 (L) 09/23/2021    ALBUMIN 2.4 (L) 09/23/2021    ALT 11 09/23/2021    AST 15 09/23/2021    ALKPHOS 67 09/23/2021       Lab Results   Component Value Date    INR 1.14 09/21/2021    APTT 30.7 09/21/2021

## 2021-09-23 NOTE — Unmapped (Cosign Needed)
Lower GI Surgery Consult Note    Requesting Attending Physician:  [name], *  Service Requesting Consult:  [facility])  Service Providing Consult: Colorectal Surgery  Consulting Attending: Lind Covert, MD    Assessment:  Colleen Russo is a 46 y.o. 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       PLAN:   - Agree with current management of ECF, including TPN and PO for comfort. It is unlikely the source of current bacteremia given lack of undrained collections.  - Consider VIR consult for sinogram given lack of output from existing drain. Cavity appears collapsed on recent CT and drain may be amenable to removal.     If you have any questions, concerns or changes in the patient's clinical status, please feel free to contact [facility] consult pager [phone]. Thank you for this interesting consult.    History of Present Illness:     Colleen Russo is a 46 y.o. female who is seen in consultation for ECF management at the request of [name], * on the [facility]) service.     Colleen Russo is 46 y.o. 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 Briefly, the patient underwent radical debulking in 2020, adjuvant chemotherapy 2021 and 2022 for recurrence who developed multiple pSBOs. Given her recurrent obstructive symptoms, she was taken to the OR on 01/23/21 for diagnostic laparoscopy converted to exploratory laparotomy which was c/b an unavoidable small bowel enterotomy. One month post-op, she developed an ECF. At the OSH she was taken back to the OR several times for I&D and local management of the ECF and its associated abscesses. The patient reports the connection with her skin closed in October 2022. Since this time, she developed another anterior abdominal wall abscess for which a drain was placed 2/7. She reports the drain has had no output other than flush since discharge home. She was then readmitted 2/17 with hypoglycemia and bacteremia.     In terms of bowel function, the patient reports bowel movements approx every 3 days and daily flatus. She has a transesophageal G tube which stays to straight drain except with meds and some  PO for comfort. She tolerates this PO well. She is otherwise TPN dependent.      Past Medical History:   Clear cell carcinoma    Past Surgical History:  Past Surgical History:   Procedure Laterality Date   ??? IR INSERT G-TUBE PERCUTANEOUS  09/06/2021    IR INSERT G-TUBE PERCUTANEOUS 09/06/2021 Gwenlyn Fudge, MD IMG VIR [facility]       Medications:  No current facility-administered medications on file prior to encounter.     Current Outpatient Medications on File Prior to Encounter   Medication Sig Dispense Refill   ??? acetaminophen (TYLENOL) 325 MG tablet Take 2 tablets (650 mg total) by mouth every six (6) hours as needed.  0   ??? bisacodyL (DULCOLAX) 10 mg suppository Insert 1 suppository (10 mg total) into the rectum daily as needed. 12 suppository 0   ??? calcium carbonate (TUMS) 200 mg calcium (500 mg) chewable tablet Chew 1 tablet (200 mg of elem calcium total) Three (3) times a day as needed.  0   ??? [START ON 09/24/2021] cyclophosphamide (CYTOXAN) 50 mg capsule Take 1 capsule (50 mg  total) by mouth daily . Do not start until discussing with your oncologist 30 capsule 5   ??? dexAMETHasone (DECADRON) 1 MG tablet Take 2 tablets (2 mg total) by mouth daily for 3 days, THEN 1 tablet (1 mg total) daily for 7 days. 13 tablet 0   ??? docusate sodium (COLACE) 100 MG capsule Take 100 mg by mouth daily.     ??? famotidine (PEPCID) 20 MG tablet Take 20 mg by mouth daily.     ??? fentaNYL (DURAGESIC) 50 mcg/hr patch Place 1 patch on the skin every third day. 10 patch 0   ??? glucose 4 GM chewable tablet Chew 4 tablets (16 g total) every ten (10) minutes as needed for low blood sugar ((For Blood Glucose LESS than 70 mg/dL and GREATER than or EQUAL to 54 mg/dL and able to speak clearly and able to swallow/NOT NPO.)). 50 tablet 2   ??? hydrOXYzine (ATARAX) 25 MG tablet Take 1 tablet (25 mg total) by mouth every six (6) hours as needed for anxiety for up to 14 days. 30 tablet 0   ??? insulin lispro (HUMALOG) 100 unit/mL injection pen Inject 0-0.2 mL (0-20 Units total) under the skin every six (6) hours as needed. Administer number of insulin units based on your blood glucose: BG 160-200 = 1 unit BG 201-240 = 4 units BG 241-280 = 6 units BG 281-300 = 8 units BG > 300 = 10 units and notify provider 24 mL 0   ??? insulin NPH (HUMULIN N NPH U-100 INSULIN) 100 unit/mL injection Inject 20 Units under the skin daily. Inject 1 hour prior to starting TPN infusion (Patient not taking: Reported on 09/22/2021) 10 mL 0   ??? [EXPIRED] levoFLOXacin (LEVAQUIN) 750 MG tablet Take 1 tablet (750 mg total) by mouth daily for 9 days. 9 tablet 0   ??? lidocaine 4 % patch Place 1 patch on the skin daily. Apply to affected area for 12 hours only each day (then remove patch). 30 patch 0   ??? metoclopramide (REGLAN) 10 MG tablet Take 10 mg by mouth every eight (8) hours.     ??? [EXPIRED] metroNIDAZOLE (FLAGYL) 500 MG tablet Take 1 tablet (500 mg total) by mouth Three (3) times a day for 10 days. 30 tablet 0   ??? [EXPIRED] MORPhine 20 mg/mL concentrated solution Take 0.25 mL (5 mg total) by mouth every four (4) hours as needed for up to 5 days. 7.5 mL 0   ??? OLANZapine (ZYPREXA) 2.5 MG tablet Take 1 tablet (2.5 mg total) by mouth daily. 30 tablet 0   ??? OLANZapine (ZYPREXA) 5 MG tablet Take 1 tablet (5 mg total) by mouth nightly. 30 tablet 0   ??? omeprazole (PRILOSEC) 40 MG capsule Take 40 mg by mouth daily.     ??? polyethylene glycol (GLYCOLAX) 17 gram/dose powder Take 17 g by mouth daily.     ??? prochlorperazine (COMPAZINE) 10 MG tablet Take 10 mg by mouth every eight (8) hours as needed.     ??? scopolamine (TRANSDERM-SCOP) 1 mg over 3 days Place 1 patch on the skin every third day.     ??? senna (SENOKOT) 8.6 mg tablet Take 2 tablets by mouth two (2) times a day as needed for constipation. 30 tablet 0   ??? simethicone (MYLICON) 80 MG chewable tablet Chew 1 tablet (80 mg total) every six (6) hours as needed. 100 tablet 0   ??? sodium chloride (NS) 0.9 % injection Use 5 mL by Intra-cannular  route daily. 150 mL 0   ??? [DISCONTINUED] fentaNYL (DURAGESIC) 50 mcg/hr patch Place 1 new patch on the skin every third day (every 72 hours). Remove old patch before applying new one. Please discard old patch after folding sticky ends together. 10 patch 0       Allergies:  Allergies   Allergen Reactions   ??? Carboplatin Shortness Of Breath   ??? Ketamine Other (See Comments)     Rigors, nausea       Family History:  No family history on file.    Social History:   Social History     Tobacco Use   ??? Smoking status: Never   ??? Smokeless tobacco: Never       Review of Systems  10 systems were reviewed and are negative except as noted specifically in the HPI.    Objective  Vitals:   Temp:  [36.6 ??C (97.9 ??F)-37.1 ??C (98.7 ??F)] 36.9 ??C (98.4 ??F)  Heart Rate:  [92-112] 112  SpO2 Pulse:  [89-116] 116  Resp:  [12-22] 19  BP: (105-126)/(63-92) 117/72  MAP (mmHg):  [77-97] 87  SpO2:  [97 %-100 %] 100 %      Intake/Output last 24 hours:    Intake/Output Summary (Last 24 hours) at 09/23/2021 1527  Last data filed at 09/23/2021 1353  Gross per 24 hour   Intake 560 ml   Output 440 ml   Net 120 ml         Physical Exam:   CONSTITUTIONAL: No acute distress. Chronically ill appearing.  EYES: Anicteric. Extraocular movements are grossly intact.   EAR, NOSE, MOUTH AND THROAT: Mucous membranes moist. Normal dentition. Neck supple. Trachea midline. No JVD.  HEART/CARDIOVASCULAR: Normal sinus rhythm without murmur, rub or gallop.   CHEST/PULMONARY: Clear to auscultation bilaterally, no wheezes or rhonchi. No increased respiratory effort.   ABDOMEN/GASTROINTESTINAL: Soft, non-tender, non-distended. Firm nodule to the left of the umbilicus. Perc drain in place with no output. No purulence or bilious drainage from skin. No induration or erythema.   MUSCULOSKELETAL: Moves all 4 extremities spontaneously. Full range of motion.   SKIN: Warm. Well perfused without cyanosis or edema.   NEUROLOGIC: Alert and oriented x3. Sensory and motor grossly intact.  LYMPHATICS: No palpable lymphadenopathy.       Pertinent Diagnostic Tests:  All lab results last 24 hours:    Recent Results (from the past 24 hour(s))   Aerobic/Anaerobic Culture    Collection Time: 09/22/21  9:54 PM    Specimen: Abdomen; Aspirate   Result Value Ref Range    Aerobic/Anaerobic Culture TOO YOUNG TO READ     Gram Stain Result 3+ Polymorphonuclear leukocytes     Gram Stain Result 1+ Mixed Gram Positive Organisms    POCT Glucose    Collection Time: 09/23/21  1:48 AM   Result Value Ref Range    Glucose, POC 206 (H) 70 - 179 mg/dL   CBC    Collection Time: 09/23/21  4:20 AM   Result Value Ref Range    WBC 2.9 (L) 3.6 - 11.2 10*9/L    RBC 4.91 3.95 - 5.13 10*12/L    HGB 13.4 11.3 - 14.9 g/dL    HCT 16.1 09.6 - 04.5 %    MCV 82.3 77.6 - 95.7 fL    MCH 27.3 25.9 - 32.4 pg    MCHC 33.2 32.0 - 36.0 g/dL    RDW 40.9 (H) 81.1 - 15.2 %    MPV 7.1 6.8 -  10.7 fL    Platelet 181 150 - 450 10*9/L   Comprehensive Metabolic Panel    Collection Time: 09/23/21  4:20 AM   Result Value Ref Range    Sodium 137 135 - 145 mmol/L    Potassium 3.6 3.4 - 4.8 mmol/L    Chloride 104 98 - 107 mmol/L    CO2 26.0 20.0 - 31.0 mmol/L    Anion Gap 7 5 - 14 mmol/L    BUN 7 (L) 9 - 23 mg/dL    Creatinine 1.61 (L) 0.60 - 0.80 mg/dL    BUN/Creatinine Ratio 16     eGFR CKD-EPI (2021) Female >90 >=60 mL/min/1.57m2    Glucose 163 70 - 179 mg/dL    Calcium 8.7 8.7 - 09.6 mg/dL    Albumin 2.4 (L) 3.4 - 5.0 g/dL    Total Protein 5.5 (L) 5.7 - 8.2 g/dL    Total Bilirubin 0.2 (L) 0.3 - 1.2 mg/dL    AST 15 <=04 U/L    ALT 11 10 - 49 U/L    Alkaline Phosphatase 67 46 - 116 U/L   Magnesium Level Collection Time: 09/23/21  4:20 AM   Result Value Ref Range    Magnesium 1.5 (L) 1.6 - 2.6 mg/dL   Phosphorus Level    Collection Time: 09/23/21  4:20 AM   Result Value Ref Range    Phosphorus 3.5 2.4 - 5.1 mg/dL   Glucose, Random    Collection Time: 09/23/21  4:20 AM   Result Value Ref Range    Glucose 163 70 - 179 mg/dL   Lactate, Venous, Whole Blood    Collection Time: 09/23/21  4:20 AM   Result Value Ref Range    Lactate, Venous 1.3 0.5 - 1.8 mmol/L   POCT Glucose    Collection Time: 09/23/21  4:44 AM   Result Value Ref Range    Glucose, POC 158 70 - 179 mg/dL   POCT Glucose    Collection Time: 09/23/21  9:06 AM   Result Value Ref Range    Glucose, POC 82 70 - 179 mg/dL   POCT Glucose    Collection Time: 09/23/21 10:53 AM   Result Value Ref Range    Glucose, POC 55 (L) 70 - 179 mg/dL   POCT Glucose    Collection Time: 09/23/21 12:12 PM   Result Value Ref Range    Glucose, POC 87 70 - 179 mg/dL   POCT Glucose    Collection Time: 09/23/21  2:13 PM   Result Value Ref Range    Glucose, POC 94 70 - 179 mg/dL   POCT Glucose    Collection Time: 09/23/21  3:46 PM   Result Value Ref Range    Glucose, POC 103 70 - 179 mg/dL   POCT Glucose    Collection Time: 09/23/21  6:21 PM   Result Value Ref Range    Glucose, POC 116 70 - 179 mg/dL   POCT Glucose    Collection Time: 09/23/21  8:05 PM   Result Value Ref Range    Glucose, POC 122 70 - 179 mg/dL       Imaging:  CT Abdomen Pelvis W Contrast    Result Date: 09/22/2021  EXAM: CT ABDOMEN PELVIS W CONTRAST DATE: 09/22/2021 4:51 PM ACCESSION: [id number] UN DICTATED: 09/22/2021 6:18 PM INTERPRETATION LOCATION: [facility] CLINICAL INDICATION: 46 years old with purulent discharge from [facility] fistula  COMPARISON: CT abdomen and pelvis 09/09/2021 TECHNIQUE: A helical CT scan of the  abdomen and pelvis was obtained following IV contrast from the lung bases through the pubic symphysis. Images were reconstructed in the axial plane. Coronal and sagittal reformatted images were also provided for further evaluation. FINDINGS: LOWER CHEST: Area of linear/subsegmental atelectasis seen throughout the right lower lobe. ABDOMEN/PELVIS: LIVER: Diffuse hepatic steatosis. Ill-defined region of hypoattenuation seen along the falciform ligament, likely reflects focal fatty deposition. No other focal hepatic lesion identified. BILIARY: Status-post cholecystectomy. Common bile duct is somewhat prominent in appearance, however, gradually tapers to the level of the ampulla without obstructing stone or mass lesion identified. This is a nonspecific finding, and may be seen in the setting of postcholecystectomy state. SPLEEN: Normal in size and contour. PANCREAS: Atrophic. No focal parenchymal abnormality identified. ADRENAL GLANDS: Normal appearance of the adrenal glands. KIDNEYS/URETERS: Symmetric nephrograms. No enhancing solid renal mass lesion. No evidence of hydronephrosis. BLADDER: Moderately distended, but is otherwise unremarkable in appearance. REPRODUCTIVE ORGANS: The uterus is surgically absent. No adnexal mass lesion identified. GI TRACT/PERITONEUM: Esophagogastric tube with tip coiled within the stomach. Similar diffusely dilated small bowel loops tethered to the anterior abdomen as well as to multiple metastatic peritoneal lesions/disease. There are multiple candidate transition points (for example 2:75 and 2:91) and the distal small bowel is decompressed. Additionally, the mucosa of the affected bowel is slightly increased in enhancement. 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 Near complete resolution of the previously seen calcifications within the collection in the anterior abdominal wall soft tissues status post drain placement (2:86). Multiple locules of gas in the anterior central abdomen (for example 2:74) not definitively communicating with the enterocutaneous fistula. Moderate volume diffuse abdominal pelvic ascites with new peritoneal enhancement (for example 2:58). Symmetric appearance of the esophagogastric tube with the distal tip coiled within the stomach.  Nondilated appendix (2:110). Similar asymmetric thickening of the stomach wall (2:38). LYMPH NODES: No adenopathy. VESSELS: Hepatic and portal veins are patent.  Normal caliber aorta.  BONES and SOFT TISSUES: No aggressive osseous lesions.  Enterocutaneous fistulas above. Similar enhancing subcutaneous soft tissue implants for example in the left lower quadrant at 2:65. Small focus of gas in the left lateral abdominal wall (2:57).     1. Persistent dilatation of small bowel loops with several transition points compatible with complex small bowel obstruction with likely closed loop component. There is slight interval increase in enhancement of the affected bowel mucosa. Recommend correlation with lactate and/or surgical consultation for ischemia. 2. Scattered foci of gas in the anterior peritoneum. These do not definitively technique with the enterocutaneous fistula and may represent focal walled off perforation. 3. New enhancement of peritoneal ascites concerning for peritonitis. 4. Decreased size of gas and fluid containing collection in the left anterior abdominal wall arising from the known enterocutaneous fistula status post drain placement. The findings of this study (new peritonitis and persistent closed small bowel obstruction) were discussed via telephone with DR. Leonardo Marucci by Dr. Lucienne Capers on 09/22/2021 6:40 PM. ==================== ADDENDUM: (09/22/2021 8:26 PM) On review, the following additional findings were noted: Limited exam in the absence of oral contrast. Redemonstration of a complex enterocutaneous fistula with associated soft tissue stranding at the level of the anterior midline abdomen. There has been interval placement of a pigtail drainage catheter within the previously-described air and fluid collection along the left lower anterior abdominal wall, which is significantly decreased in size when compared to prior exam. Numerous persistently dilated loops of small bowel are seen throughout the abdomen and pelvis, which demonstrate  air-fluid levels and measure up to 3.7 cm in diameter. There is similar tethering of the small bowel along the anterior peritoneum with abrupt collapse at this level, thought likely to reflect a transition point (2:72). The distal small bowel appears mostly decompressed to the level of the cecum. These findings remain worrisome for small bowel obstruction. Low attenuation abdominopelvic ascites (~18 HU) and diffuse peritoneal thickening/enhancement is similar to prior exam. While these findings, could potentially be seen in the setting of peritonitis, could also be seen with diffuse peritoneal disease spread and associated malignant ascites. Recommend clinical correlation. Elsewhere throughout the abdomen multiple loops of small demonstrate short segments of wall thickening, hyperenhancement, and luminal narrowing (2:63; 2:94), which may reflect sites of serosal involvement and associated stricturing. -----------------------------------------------

## 2021-09-23 NOTE — Unmapped (Shared)
GYN ONC PROGRESS NOTE    Assessment and Plan:  Colleen Russo is a 46 y.o. 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    Refractory Hypoglycemia - H/o Type 2 Diabetes   - Discharged 2/9 on NPH 20 units daily to take 1 hr prior to TPN > dc'd 2/2 lows to 30s at home  - BG stabilized 2/17 AM after D50 bolus x 4 and 150 ml/hr D10/0.5NS > dropped to low 50's 2/18  - Admit WBC, UA, lytes normal; blood cultures positive for GPC and GPR (see below)  - Management per Primary Team  - Endocrinology following, appreciate recommendations, current recommendations:    - Continue dextrose infusion and titrate for blood glucose >90  <120 mg/d Ultimately would have to come off her D10 drip.   Once stopped if  blood sugar <50, would repeat the following  labs: insulin, c-peptide, serum glucose  and beta-hydroxy-butyrate;  Once labs are collected, would  give Glucagon 1 mg subcutaneous followed bu serum glucose  checks and 30  min after glucagon.    - If decision is to continue dextrose for now at same rate,  space out glucose checks to every 4 hours but if D10 drip rate  change would decrease to every 2 hours to monitor.    - decrease HC to 25 mg q 12 hrs and eventually, we will put her  back on dexamethasone 1 mg (aprox. 26 mg of HC).   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  - Most recently, abscess drained by VIR 2/7, initially on Zosyn > continues on Levofloxacin and Flagyl until 2/19   - Admit Blood Cultures w/ GPC and GPR, agree with vancomycin per primary team  - DDx includes EC fistula, superficial abscess, recurrent intraabdominal abscess, malposition of JP drain, PICC colonization  - Recommend repeat imaging with CTAP +/- VIR consultation to evaluate JP drain placement    Onc: Recurrent Clear Cell Carcinoma of the Ovary   - Primary Oncologist: [name]  - s/p XL??with??radical tumor debulking including??total hysterectomy,??bilateral salpingo-oophorectomy, infra-gastric??omentectomy, and??washings??on 07/21/2019  -??s/p 6 cycles carbo/taxol, completed 12/05/19  -CT 2/022 concerning for progression, started on carbo/gem 2/22-5/24/22.  -CT 12/2020 stable disease  - 01/23/21:??Diagnostic laparoscopy, exploratory laparotomy, unavoidable enterotomy with repair  -02/2021: noted to have EC fistuala,??OR on 7/2 for incision, exploration, and drainage of subcutaneous abscess??(6x8 cm) lateral to recent ex-lap incision. No definitive succus seen intra-op.??Treated conservatively with bowel rest and TPN for 1 month then had several I&D of recollected abscess at site of EC fistula with drain placed and left until clinical and imaging evidence of resolution????  -12/29-08/07/21 admission for partial SBO and abdominal wall fluid collection, biopsy of fluid collection consistent at first for upper GI or pancreatobiliary primary??  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  - Last CA-125:??180 on 09/06/2021  ??  Sick Euthyroid Syndrome  - Last admit TSH 0.237, FT4 0.54  ??  Small bowel obstruction - TEG  - Denies abdominal pain, nausea, vomiting on admit. Tolerating clears and bites of food at home.  - Continue home scopolamine patch, using Reglan and Compazine PRN  ??  Chronic Pain  - S/p Palliative Care consult, pain well controlled on 50 mcg fentanyl patches and Tylenol  PRN  ??  Poor Nutrition  - S/p Nutrition consult 2/18  - Plan to continue TPN  ??  Anemia of Chronic Disease   - Admit Hgb 7.9 stable from prior  ??  Prophylaxis: Lovenox  ??  Code Status: full code, confirmed on admission.   ??  Disposition: floor    Plan discussed with Dr. Sherlean Foot. Attending Dr. Reyne Dumas immediately available.    Subjective:***  Patient continues to feel the same from yesterday. Denies nausea, vomiting, fever, chills, or abdominal pain. She said she feels as though her JP drain is possibly malpositioned or not draining properly.     Objective:  Temp:  [36.9 ??C (98.4 ??F)-37.1 ??C (98.7 ??F)] 36.9 ??C (98.4 ??F)  Heart Rate:  [92-108] 92  SpO2 Pulse:  [96-103] 96  Resp:  [12-27] 12  BP: (102-126)/(68-92) 112/72  SpO2:  [97 %-100 %] 98 %    -General:                    Well-appearing in NAD.  -CV:                             RRR. No m/r/g. Port site without surrounding erythema, induration or drainage.  -Chest:                         CTAB. Good air movement. Normal WOB. PICC site without erythema, induration or drainage.   -Abd:                           Non-tender, non-distended abdomen.  Abdominal incisions well healed. TEG site and JP drain sites without leakage covered by bandage without strike-through. No guarding, no rebound. JP bulb with clear fluid. EC site palpated, no discharge, erythema or induration.   -Extremities:              No clubbing, erythema, or edema.   -Psych:                       Appropriate.The patient is awake and alert.    Medications (scheduled)   ??? enoxaparin (LOVENOX) injection  40 mg Subcutaneous Q24H   ??? fentaNYL  1 patch Transdermal Q72H   ??? hydrocortisone  25 mg Oral BID   ??? levoFLOXacin  750 mg Oral Daily   ??? lidocaine  1 patch Transdermal Daily   ??? lidocaine  1 patch Transdermal Once   ??? metroNIDAZOLE  500 mg Oral TID   ??? OLANZapine  2.5 mg Oral Daily   ??? OLANZapine  5 mg Oral Nightly   ??? pantoprazole  40 mg Oral Daily   ??? polyethylene glycol  17 g Oral Daily   ??? scopolamine  1 patch Topical Q72H   ??? vancomycin  1,250 mg Intravenous Q8H       Medications (prn)  acetaminophen, calcium carbonate, metoclopramide, naloxone, senna, simethicone    Labs:   Lab Results   Component Value Date    WBC 2.9 (L) 09/23/2021    HGB 13.4 09/23/2021    HCT 40.4 09/23/2021    PLT 181 09/23/2021       Lab Results   Component Value Date    NA 135 09/22/2021    K 3.8 09/22/2021    CL 102 09/22/2021    CO2 27.0 09/22/2021    BUN 6 (  L) 09/22/2021    CREATININE 0.45 (L) 09/22/2021    GLU 163 09/23/2021    CALCIUM 8.6 (L) 09/22/2021    MG 1.5 (L) 09/23/2021    PHOS 3.5 09/23/2021       Lab Results   Component Value Date    BILITOT 0.3 09/22/2021    BILIDIR 0.10 09/11/2021    PROT 5.0 (L) 09/22/2021    ALBUMIN 2.3 (L) 09/22/2021    ALT 8 (L) 09/22/2021    AST 15 09/22/2021    ALKPHOS 58 09/22/2021       Lab Results   Component Value Date    INR 1.14 09/21/2021    APTT 30.7 09/21/2021     Scribe's Attestation: ***, MD obtained and performed the history, physical exam and medical decision making elements that were  entered into the chart. Documentation assistance was provided by me personally, a scribe. Signed by Tinnie Gens Scribe, on September 23, 2021 at 5:17 AM.         {*** NOTE TO PROVIDER: PLEASE ADD ATTESTATION NOTING YOU AGREE WITH SCRIBE DOCUMENTATION. For template- Go to smartphrases --> [place], C --> [email or web address]}

## 2021-09-23 NOTE — Unmapped (Signed)
Neuro: Aox4, pleasant, follows commands, involved with self-care, anxious occasionally    Cardi: NSR, VSS, pulses palpable,    Resp: RA, SpO2 WNL, clear lung sounds, no complaints of SOB on exertion,     Gi/Gu: NPO w/ sips for meds, no BM this shift, voiding in restroom, receiving D10W continuous 131mL/hr (rate changed 50 mL/hr end of shift), pigtail straight drain showing minimal but consistent output, bulb suction to abscess showed no drainage    Musk: independent, ambulatory, requires assistance with wires and getting situated     Skin: lower abdominal abscess with small consistent green drainage from site, dressing to site was changed during shift is now c/d/i    Plan: pt arrived to CTSU from ED following adx of symptomatic hypoglycemia, other presenting problems were a lower abdominal abscess w/ green drainage and ovarian cancer, providers for GYN Oncology came and assessed pt, Hospital[facility] was primary team for the shift, pt hypoglycemia was much more controlled following transfer top CTSU, sugars within more acceptable ranges, pt was experiencing increasingly more pain in lower abdominal area (team was paged multiple times for PRN orders), once pain measures were obtained, the dressing on pt's abdomen was changed now c/d/I, no BM, pt rested the rest of the shift, will continue to monitor     Problem: Adult Inpatient Plan of Care  Goal: Absence of Hospital-Acquired Illness or Injury  Intervention: Prevent Skin Injury  Recent Flowsheet Documentation  Taken 09/23/2021 0000 by Michae Kava, RN  Skin Protection: adhesive use limited  Intervention: Prevent and Manage VTE (Venous Thromboembolism) Risk  Recent Flowsheet Documentation  Taken 09/23/2021 0600 by Michae Kava, RN  Activity Management: activity adjusted per tolerance  Taken 09/23/2021 0400 by Michae Kava, RN  Activity Management: activity adjusted per tolerance     Problem: Skin Injury Risk Increased  Goal: Skin Health and Integrity  Intervention: Optimize Skin Protection  Recent Flowsheet Documentation  Taken 09/23/2021 0000 by Michae Kava, RN  Pressure Reduction Techniques: frequent weight shift encouraged  Pressure Reduction Devices: pressure-redistributing mattress utilized  Skin Protection: adhesive use limited

## 2021-09-23 NOTE — Unmapped (Signed)
Adult Nutrition   Note      Visit Type:    Reason for Visit:      Consult to nutrition services for TPN received. Discussed plan with MD. Positive blood cultures 02/18. Holding off on TPN initiation today, patient may require new IV access to start TPN. Will continue to follow for PN initiation.     Amalia Greenhouse, MPH, RD, LDN, CNSC  Pager: [phone]

## 2021-09-24 LAB — VANCOMYCIN, TROUGH: VANCOMYCIN TROUGH: 31.2 ug/mL (ref 10.0–20.0)

## 2021-09-24 LAB — PHOSPHORUS: PHOSPHORUS: 4.4 mg/dL (ref 2.4–5.1)

## 2021-09-24 LAB — COMPREHENSIVE METABOLIC PANEL
ALBUMIN: 2.4 g/dL — ABNORMAL LOW (ref 3.4–5.0)
ALKALINE PHOSPHATASE: 58 U/L (ref 46–116)
ALT (SGPT): 10 U/L (ref 10–49)
ANION GAP: 7 mmol/L (ref 5–14)
AST (SGOT): 15 U/L (ref <=34)
BILIRUBIN TOTAL: 0.3 mg/dL (ref 0.3–1.2)
BLOOD UREA NITROGEN: 8 mg/dL — ABNORMAL LOW (ref 9–23)
BUN / CREAT RATIO: 13
CALCIUM: 8.7 mg/dL (ref 8.7–10.4)
CHLORIDE: 109 mmol/L — ABNORMAL HIGH (ref 98–107)
CO2: 27 mmol/L (ref 20.0–31.0)
CREATININE: 0.62 mg/dL
EGFR CKD-EPI (2021) FEMALE: >90 mL/min/{1.73_m2} (ref >=60)
GLUCOSE RANDOM: 61 mg/dL — ABNORMAL LOW (ref 70–179)
POTASSIUM: 3.6 mmol/L (ref 3.4–4.8)
PROTEIN TOTAL: 5.2 g/dL — ABNORMAL LOW (ref 5.7–8.2)
SODIUM: 143 mmol/L (ref 135–145)

## 2021-09-24 LAB — C-PEPTIDE: C-PEPTIDE: 0.14 ng/mL — ABNORMAL LOW (ref 0.48–5.05)

## 2021-09-24 LAB — CBC
HEMATOCRIT: 24.1 % — ABNORMAL LOW (ref 34.0–44.0)
HEMOGLOBIN: 7.7 g/dL — ABNORMAL LOW (ref 11.3–14.9)
MEAN CORPUSCULAR HEMOGLOBIN CONC: 32.1 g/dL (ref 32.0–36.0)
MEAN CORPUSCULAR HEMOGLOBIN: 26.6 pg (ref 25.9–32.4)
MEAN CORPUSCULAR VOLUME: 82.9 fL (ref 77.6–95.7)
MEAN PLATELET VOLUME: 6.9 fL (ref 6.8–10.7)
PLATELET COUNT: 305 10*9/L (ref 150–450)
RED BLOOD CELL COUNT: 2.91 10*12/L — ABNORMAL LOW (ref 3.95–5.13)
RED CELL DISTRIBUTION WIDTH: 17.2 % — ABNORMAL HIGH (ref 12.2–15.2)
WBC ADJUSTED: 7.2 10*9/L (ref 3.6–11.2)

## 2021-09-24 LAB — MAGNESIUM: MAGNESIUM: 1.7 mg/dL (ref 1.6–2.6)

## 2021-09-24 LAB — GLUCOSE, BODY FLUID: GLUCOSE FLUID: 69 mg/dL

## 2021-09-24 LAB — IGF BINDING PROTEIN-3: IGF BINDING PROTEIN 3: 2.3 ug/mL — ABNORMAL LOW

## 2021-09-24 LAB — ACTH: ADRENOCORTICOTROPIC HORMONE: <5 pg/mL — ABNORMAL LOW

## 2021-09-24 LAB — GLUCOSE, RANDOM: GLUCOSE RANDOM: 52 mg/dL — ABNORMAL LOW (ref 70–179)

## 2021-09-24 LAB — INSULIN, TOTAL: INSULIN LEVEL: 14.3 mU/L (ref 2.6–37.6)

## 2021-09-24 LAB — GROWTH HORMONE: GROWTH HORMONE: 4.73 ng/mL — ABNORMAL HIGH

## 2021-09-24 LAB — BETA HYDROXYBUTYRATE: BETA-HYDROXYBUTYRATE: 0.36 mmol/L — ABNORMAL HIGH (ref 0.02–0.27)

## 2021-09-24 MED ORDER — CYCLOPHOSPHAMIDE 50 MG CAPSULE
ORAL_CAPSULE | Freq: Every day | ORAL | 5 refills | 30.00000 days | Status: CP
Start: 2021-09-24 — End: ?

## 2021-09-24 MED ADMIN — fentaNYL (DURAGESIC) 50 mcg/hr 1 patch: 1 | TRANSDERMAL | @ 05:00:00 | Stop: 2021-10-08

## 2021-09-24 MED ADMIN — metroNIDAZOLE (FLAGYL) IVPB 500 mg: 500 mg | INTRAVENOUS | @ 20:00:00 | Stop: 2021-09-28

## 2021-09-24 MED ADMIN — metroNIDAZOLE (FLAGYL) IVPB 500 mg: 500 mg | INTRAVENOUS | @ 03:00:00 | Stop: 2021-09-28

## 2021-09-24 MED ADMIN — dexAMETHasone (DECADRON) tablet 1 mg: 1 mg | ORAL | @ 15:00:00 | Stop: 2021-09-29

## 2021-09-24 MED ADMIN — OLANZapine (ZyPREXA) tablet 2.5 mg: 2.5 mg | ORAL | @ 13:00:00

## 2021-09-24 MED ADMIN — simethicone (MYLICON) chewable tablet 80 mg: 80 mg | ORAL | @ 23:00:00

## 2021-09-24 MED ADMIN — enoxaparin (LOVENOX) syringe 40 mg: 40 mg | SUBCUTANEOUS | @ 13:00:00

## 2021-09-24 MED ADMIN — dextrose 5 % and sodium chloride 0.45 % infusion: 100 mL/h | INTRAVENOUS | @ 17:00:00

## 2021-09-24 MED ADMIN — lidocaine (LIDODERM) 5 % patch 1 patch: 1 | TRANSDERMAL | @ 13:00:00

## 2021-09-24 MED ADMIN — vancomycin (VANCOCIN) 1250 mg in sodium chloride (NS) 0.9 % 250 mL IVPB (premix): 1250 mg | INTRAVENOUS | @ 13:00:00 | Stop: 2021-09-24

## 2021-09-24 MED ADMIN — OLANZapine (ZyPREXA) tablet 5 mg: 5 mg | ORAL | @ 01:00:00

## 2021-09-24 MED ADMIN — senna (SENOKOT) tablet 2 tablet: 2 | ORAL | @ 13:00:00

## 2021-09-24 MED ADMIN — lactated Ringers infusion: 75 mL/h | INTRAVENOUS | @ 11:00:00 | Stop: 2021-09-24

## 2021-09-24 MED ADMIN — levoFLOXacin (LEVAQUIN) 750 mg/150 mL IVPB 750 mg: 750 mg | INTRAVENOUS | @ 14:00:00 | Stop: 2021-09-28

## 2021-09-24 MED ADMIN — metoclopramide (REGLAN) tablet 10 mg: 10 mg | ORAL | @ 14:00:00

## 2021-09-24 MED ADMIN — lidocaine (XYLOCAINE) 10 mg/mL (1 %) injection: @ 21:00:00 | Stop: 2021-09-24

## 2021-09-24 MED ADMIN — bacitracin-polymyxin b (POLYSPORIN) ointment: TOPICAL | @ 21:00:00

## 2021-09-24 MED ADMIN — metroNIDAZOLE (FLAGYL) IVPB 500 mg: 500 mg | INTRAVENOUS | @ 11:00:00 | Stop: 2021-09-28

## 2021-09-24 MED ADMIN — glucagon injection 1 mg: 1 mg | INTRAVENOUS | @ 16:00:00 | Stop: 2021-09-24

## 2021-09-24 MED ADMIN — pantoprazole (PROTONIX) EC tablet 40 mg: 40 mg | ORAL | @ 13:00:00

## 2021-09-24 MED ADMIN — vancomycin (VANCOCIN) 1250 mg in sodium chloride (NS) 0.9 % 250 mL IVPB (premix): 1250 mg | INTRAVENOUS | @ 05:00:00 | Stop: 2021-09-24

## 2021-09-24 MED ADMIN — docusate sodium (COLACE) capsule 100 mg: 100 mg | ORAL | @ 15:00:00

## 2021-09-24 NOTE — Unmapped (Signed)
VASCULAR INTERVENTIONAL RADIOLOGY  Sinogram of Existing Drain Consultation     Requesting Attending Physician: [name], *  Service Requesting Consult: Med Bernita Raisin [facility])    Date of Service: 09/24/2021  Consulting Interventional Radiologist: Dr. Orlando Penner     HPI:     Consultation request: Sinogram of abdominal wall drainage catheter  Reason for Consultation:  Decreased output    Zetta Stoneman is a 46 y.o. female with ovarian carcinoma with history of multiple SBOs, and known EC fistula. Given her recurrent obstructive symptoms, she was taken to the OR on 01/23/21 for diagnostic laparoscopy converted to exploratory laparotomy which was c/b an unavoidable small bowel enterotomy. One month post-op, she developed an ECF.   She has since had several I&D's and local management of EC fistula with associated abscesses.  Patient who admitted found to have anterior abdominal wall abscess status post VIR drainage catheter placement on 09/10/2021.  Patient now readmitted 2/17 with bacteremia and hypoglycemia.  Patient reports no output from drainage catheter.Patient also with small volume ascites concerning for peritonitis on CT imaging.  Patient also with indwelling PICC for TPN therapy with positive cultures on 2/18 and 2/19.  Patient seen in consultation at the request of primary care team for consideration for evaluation of indwelling abdominal wall drainage catheter as well as diagnostic paracentesis.  WBC WNL.      Medical History:     Past Medical History:  No past medical history on file.    Surgical History:  Past Surgical History:   Procedure Laterality Date   ??? IR INSERT G-TUBE PERCUTANEOUS  09/06/2021    IR INSERT G-TUBE PERCUTANEOUS 09/06/2021 Gwenlyn Fudge, MD IMG VIR [facility]       Family History:  No family history on file.    Medications:   Current Facility-Administered Medications   Medication Dose Route Frequency Provider Last Rate Last Admin   ??? acetaminophen (TYLENOL) tablet 650 mg  650 mg Oral Q6H PRN Daleen Snook, MD       ??? calcium carbonate (TUMS) chewable tablet 200 mg of elem calcium  200 mg of elem calcium Oral TID PRN Daleen Snook, MD       ??? dexAMETHasone (DECADRON) tablet 1 mg  1 mg Oral Daily [name], MD        Followed by   ??? Melene Muller ON 09/29/2021] dexAMETHasone (DECADRON) tablet 0.5 mg  0.5 mg Oral Daily [name], MD       ??? enoxaparin (LOVENOX) syringe 40 mg  40 mg Subcutaneous Q24H Daleen Snook, MD   40 mg at 09/24/21 0825   ??? fentaNYL (DURAGESIC) 50 mcg/hr 1 patch  1 patch Transdermal Q72H Vibhaben Dholakia, AGNP   1 patch at 09/24/21 0022   ??? glucagon injection 1 mg  1 mg Intravenous Once PRN [name], MD       ??? lactated Ringers infusion  75 mL/hr Intravenous Continuous [name], MD 75 mL/hr at 09/24/21 0620 75 mL/hr at 09/24/21 0620   ??? levoFLOXacin (LEVAQUIN) 750 mg/150 mL IVPB 750 mg  750 mg Intravenous Q24H [name], MD   Stopped at 09/23/21 1226   ??? lidocaine (LIDODERM) 5 % patch 1 patch  1 patch Transdermal Daily Daleen Snook, MD   1 patch at 09/24/21 0825   ??? metoclopramide (REGLAN) tablet 10 mg  10 mg Oral TID PRN Daleen Snook, MD       ??? metroNIDAZOLE (FLAGYL) IVPB 500  mg  500 mg Intravenous [facility] [name], MD   Stopped at 09/24/21 [phone]   ??? naloxone [facility]) injection 0.1 mg  0.1 mg Intravenous Q5 Min PRN Daleen Snook, MD       ??? OLANZapine (ZyPREXA) tablet 2.5 mg  2.5 mg Oral Daily Daleen Snook, MD   2.5 mg at 09/24/21 9604   ??? OLANZapine (ZyPREXA) tablet 5 mg  5 mg Oral Nightly Daleen Snook, MD   5 mg at 09/23/21 2006   ??? pantoprazole (PROTONIX) EC tablet 40 mg  40 mg Oral Daily Daleen Snook, MD   40 mg at 09/24/21 5409   ??? polyethylene glycol (MIRALAX) packet 17 g  17 g Oral Daily Daleen Snook, MD       ??? scopolamine (TRANSDERM-SCOP) 1 mg over 3 days topical patch 1 mg  1 patch Topical Q72H Vibhaben Dholakia, AGNP   1 mg at 09/23/21 0248   ??? senna (SENOKOT) tablet 2 tablet  2 tablet Oral BID PRN Daleen Snook, MD   2 tablet at 09/24/21 0824   ??? simethicone (MYLICON) chewable tablet 80 mg  80 mg Oral Q6H PRN Daleen Snook, MD       ??? vancomycin (VANCOCIN) 1250 mg in sodium chloride (NS) 0.9 % 250 mL IVPB (premix)  1,250 mg Intravenous Q8H Keturah Barre, MD 200 mL/hr at 09/24/21 0825 1,250 mg at 09/24/21 0825       Allergies:  Carboplatin and Ketamine    Social History:  Social History     Tobacco Use   ??? Smoking status: Never   ??? Smokeless tobacco: Never       Objective:    Pertinent Laboratory Values:  WBC   Date Value Ref Range Status   09/24/2021 7.2 3.6 - 11.2 10*9/L Final     HGB   Date Value Ref Range Status   09/24/2021 7.7 (L) 11.3 - 14.9 g/dL Final     HCT   Date Value Ref Range Status   09/24/2021 24.1 (L) 34.0 - 44.0 % Final     Platelet   Date Value Ref Range Status   09/24/2021 305 150 - 450 10*9/L Final     INR   Date Value Ref Range Status   09/21/2021 1.14  Final     Creatinine   Date Value Ref Range Status   09/24/2021 0.62 0.60 - 0.80 mg/dL Final       Imaging Reviewed:  CT abdomen pelvis 09/22/2021 reviewed  IMPRESSION:  ??  1. Persistent dilatation of small bowel loops with several transition points compatible with complex small bowel obstruction with likely closed loop component. There is slight interval increase in enhancement of the affected bowel mucosa. Recommend correlation with lactate and/or surgical consultation for ischemia.  ??  2. Scattered foci of gas in the anterior peritoneum. These do not definitively technique with the enterocutaneous fistula and may represent focal walled off perforation.  ??  3. New enhancement of peritoneal ascites concerning for peritonitis.  ??  4. Decreased size of gas and fluid containing collection in the left anterior abdominal wall arising from the known enterocutaneous fistula status post drain placement.  ??  Physical Exam:    Vitals:    09/24/21 0721   BP: 151/92   Pulse: 107   Resp: 12   Temp: 36.9 ??C (98.4 ??F)   SpO2: 99%     ASA Grade: ASA 3 - Patient with moderate systemic disease with functional limitations  General: No  apparent distress.  Lungs: Breathing comfortably on RA.  Heart:  Regular rate and rhythm.  Neuro: No obvious focal deficits.    Airway assessment: Class 1 - Can visualize soft palate, fauces, uvula, and tonsillar pillars    Assessment and Recommendations:     Ms. Wyman is a 46 y.o. female with history of ovarian cancer and surgical history per HPI complicated by multiple SBO's and known EC fistula status post VIR abdominal wall drainage catheter placement on 09/10/2021 now with decreased output.  Patient currently readmitted with bacteremia and hypoglycemia.  Patient also with small volume ascites concerning for peritonitis.  No current leukocytosis patient currently on multiple antibiotics.    Per prior discussions, patient has a known EC fistula and placement of abdominal wall drain would likely be long-term.  Despite decreased output, if drain is removed it will result in reaccumulation of abdominal wall fluid given persistent EC fistula.  VIR does not recommend proceeding with removal of drainage catheter without repair of EC fistula.    There is very small volume ascites noted on CT imaging and paracentesis would likely be low yield given patient has currently been on antibiotic therapy for greater than 48 hours; however, team would like to proceed.     Recommendations:  - VIR does recommend proceeding with diagnostic paracentesis to rule out SBP.  - Anticipated procedure date: Today or tomorrow pending VIR schedule  -No sedation given for paracentesis therefore patient does not have to be n.p.o.  -Send fluid for culture.  Primary team to place additional specimen orders.       Informed Consent:  This procedure and sedation has been fully reviewed with the patient/patient???s authorized representative. The risks, benefits and alternatives have been explained, and the patient/patient???s authorized representative has consented to the procedure.  --The patient will accept blood products in an emergent situation.  --The patient does not have a Do Not Resuscitate order in effect.    The patient was discussed with Dr. Orlando Penner.     Thank you for involving [place] in the care of this patient. Please page the VIR consult pager [phone]) with further questions, concerns, or if new issues arise.

## 2021-09-24 NOTE — Unmapped (Signed)
Vancomycin Therapeutic Monitoring Pharmacy Note    Colleen Russo is a 46 y.o. female continuing vancomycin. Date of therapy initiation: 09/22/21    Indication: Bacteremia/Sepsis    Prior Dosing Information: Current regimen vancomcyin 1250 mg IV q8 hrs      Goals:  Therapeutic Drug Levels  Vancomycin trough goal: 10-15 mg/L    Additional Clinical Monitoring/Outcomes  Renal function, volume status (intake and output)    Results: Vancomycin level 31.2 mg/L, drawn appropriately    Wt Readings from Last 1 Encounters:   09/24/21 74.2 kg (163 lb 9.3 oz)     Creatinine   Date Value Ref Range Status   09/24/2021 0.62 0.60 - 0.80 mg/dL Final   16/05/9603 5.40 (L) 0.60 - 0.80 mg/dL Final   98/06/9146 8.29 (L) 0.60 - 0.80 mg/dL Final        Pharmacokinetic Considerations and Significant Drug Interactions:  ??? Adult (calculated on 09/24/21): Vd = 47.6 L, ke = 0.078 hr-1  ??? Concurrent nephrotoxic meds: not applicable    Assessment/Plan:  Recommendation(s)  ??? Change current regimen to vancomycin 1000 mg IV q12 hrs.  ??? Estimated trough on recommended regimen: ~14 mg/L    Follow-up  ??? Level due: prior to fourth or fifth dose  ??? A pharmacist will continue to monitor and order levels as appropriate    Please page service pharmacist with questions/clarifications.    Rubie Maid, PharmD

## 2021-09-24 NOTE — Unmapped (Signed)
Division of Infectious Diseases  General Inpatient Consultation Service     For any questions about this consult, page [phone] (Gen C Follow-up Pager).      Colleen Russo is being seen in consultation at the request of Colleen Levee, MD for evaluation and management of bacteremia and abd wall abscess/enterocutaneous fistula.       PLAN FOR 09/24/2021    Diagnostic  ??? Agree with sinogram of abdominal drain  ??? Follow pending blood cultures     ??? Monitor for antimicrobial toxicity with the following:  ??? Daily CBCs w dif  for now (given leukopenia), bmps at least multiple times per week  ??? Monitor L forearm rash, would stop vancomycin and notify ID if symptoms concerning for serious drug rash develop      Treatment  ??? Continue vancomycin (dosed by pharmacy for goal trough 10-15)   ??? Continue levofloxacin and flagyl (IV at present given SBO)  ??? Remove PICC promptly (though clearly needs alternate access first given hypoglycemia)  ??? Duration of therapy = tbd  o start date = 2/19  o end date = tbd    I discussed the plans for today with patient on 09/24/2021.    Our service will continue to follow.    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     September 24, 2021   Patient has: [x]  acute illness w/systemic sxs  [mod] [x]  illness posing risk to life or function  [high]   I reviewed: (3+) [x]  primary team note [x]  consultant note(s) []  procedure/op note(s) [x]  micro result(s)    [x]  CBC results [x]  chemistry results []  radiology report(s) []  w/indep. historian   I independently visualized:   (any)   []  cxs/plates in lab []  plain film images []  CT images []  PET images    []  path slide(s) []  ECG tracing []  MRI images []  nuclear scan   I discussed: (any) []  micro and/or path w/lab personnel []  drug options and/or interactions w/ID pharmD    []  procedure/OR findings w/other MD(s) []  echo and/or imaging w/other MD(s)    []  mgm't w/attending(s) involved in case []  setting up home abx w/OPAT team   Mgm't requires: [x]  prescription drug(s)  [mod] [x]  intensive toxicity monitoring  [high]   Minutes: []  25-34      [x]  35-49      []  50+ []  30-74 providing critical care on the unit     Interval notes & result reports show: WBC with resolved leukopenia, stable hgb from 2 days ago (suggesting yesterday's result may have been spurious), crt stable. No additional growth since yesterday on blood cultures. Remains afebrile; some tachycardia yesterday. Per gyn ok from a gyn/onc perspective to stop steroid which pt was initially receiving for nausea.    Patient reports: afebrile, no new complaints today. She thinks her R arm rash is improving.  Pt noted a few similar dots on her R medial calf yesterday, but not visible to her or me this AM. No itching at site of rash or elsewhere.     My interpretation of the data I reviewed is: no signs of worsening infection.         # Bacillus cereus, Staph hominis, Staph epi bacteremia   - acute, poses threat to life or bodily function  [high]  Staph hominis now in 3 separate peripheral blood cultures and 1 blood culture via PICC collected across ~13h on 2/18 and 2/19, clearly a true infection. B. cereus in 2/2 peripheral cultures from 2/18, not growing (yet) on subsequent. Staph epi not in initial cultures but in 2 of 2 (peripheral, PICC) on 2/19, so these also likely represent true bacteremia. Both can be found in the GI tract and also (esp.the Staph) on skin and wounds, so an initial source of the enterocutaneous fistula or elsewhere in the GI tract is most likely, but suspect PICC is now infected as well. We generally treat true bacteremia of each of these pathogens with at least 7 days of IV antibiotics, sometimes up to 14 days. If she were to remain bacteremic on subsequent cultures I would recommend a TTE to evaluate for endocarditis. PICC should be removed promptly.   ??? Continue vancomycin to treat all 3 organisms.   ??? Follow repeat blood cultures  to evaluate for clearance. Planned duration will be at least 7 days from first negative culture  ??? Remove PICC promptly (though clearly needs alternate access first given hypoglycemia)    # Enterocutaneous fistula, abdominal wall abscess, possible peritonitis   - acute, poses threat to life or bodily function  [high]  CT and drain output suggest abscess has resolved or nearly resolved. Scant staining on gauze could represent residual abscess fluid or residual fistula, but it is down to drops. New finding of peritoneal changes worrisome for either infection or malignancy; no rebound on exam to suggest peritonitis, however. Perhaps most concerningly, has poor prognosis for bowel obstruction and for healing her enterocutaneous fistula, per gyn-onc, so will likely remain at high risk for persistent or new intraabdominal infections. Will weigh in on duration of levo/flagyl post sino-gram results.    # Leukopenia   - acute, undx'd new problem w/uncertain prognosis  [mod]  ??? 2.9 on 2/20, down from baseline of 6-7, resolved the next day. Hgb also vastly different (13) on 2/20 from usual values around 7-8, suggesting that CBC may be spurious. Continue to trend CBCs- would do with dif daily for now, given rash.       # forearm rash   - acute, undx'd new problem w/uncertain prognosis  [mod]  ??? Fairly small area, not typical drug rash appearance, improved on 2/21. Monitor.    # Management of prescription antimicrobials needing intensive toxicity monitoring  Metronidazole can cause rashes, dysgeusia (altered taste), peripheral neuropathy, and/or myelosuppression.  Quinolones can cause rashes, N/V/D, CNS effects, MSK effects, arrhythmias and/or QTc prolongation, glucose abnormalities, and/or neuropathies.  Vancomycin can cause back pain, various dermatological effects, thrombocytopenia, neutropenia, and/or nephrotoxicity.   ??? See recommendations in blue box above.      # Disposition   -Likely home once improved. Likely OPAT candidate if prolonged IV antibiotics were to be required.             Antimicrobials & Other Medications   Vanc IV 2/18-  Levofloxacin 750 IV q 24 2/8 -  Flagyl 500 PO  q  2/8-    Immunomodulators   Dexamethasone taper 2/10-2/17, hydrocortisone 2/18-present  - last chemo ~9 months ago      Current Medications as of 09/24/2021  Scheduled  PRN   dexAMETHasone, 1 mg, Daily   Followed by  Melene Muller ON 09/29/2021] dexAMETHasone, 0.5 mg, Daily  docusate sodium, 100 mg, Daily  enoxaparin (LOVENOX) injection, 40 mg, Q24H  fentaNYL, 1 patch, Q72H  levoFLOXacin, 750 mg, Q24H  lidocaine, 1 patch, Daily  metroNIDAZOLE, 500 mg, Q8H SCH  OLANZapine, 2.5 mg, Daily  OLANZapine, 5 mg, Nightly  pantoprazole, 40 mg, Daily  polyethylene glycol, 17 g, Daily  scopolamine, 1 patch, Q72H  vancomycin, 1,250 mg, Q8H      acetaminophen, 650 mg, Q6H PRN  calcium carbonate, 200 mg of elem calcium, TID PRN  glucagon, 1 mg, Once PRN  metoclopramide, 10 mg, TID PRN  naloxone, 0.1 mg, Q5 Min PRN  senna, 2 tablet, BID PRN  simethicone, 80 mg, Q6H PRN           Physical Exam     Temp:  [36.4 ??C (97.6 ??F)-37 ??C (98.6 ??F)] 36.9 ??C (98.4 ??F)  Heart Rate:  [81-112] 107  SpO2 Pulse:  [88-116] 107  Resp:  [11-23] 12  BP: (96-151)/(63-92) 151/92  MAP (mmHg):  [76-106] 106  SpO2:  [99 %-100 %] 99 %    Actual body weight: 74.2 kg (163 lb 9.3 oz)  Ideal body weight: 50.1 kg (110 lb 7.2 oz)  Adjusted ideal body weight: 59.7 kg (131 lb 11.2 oz)     CONST: Vital signs above. Non-toxic appearance, no acute distress  EYES: anicteric sclerae. EOMI.   ENMT: Normal appearance of external nose and ears. OP clear. MMM.   NECK: supple, feeding tube drain entering through L neck without surrounding erythema or visible drainage, trachea midline.  CV: RRR, no m/r/g. No LE edema bilaterally.   PULM: CTAB, no wheezes or rales  ABD: soft,  ND, +BS, very mild tenderness to palpation in all 4 quadrants without rebound or guarding. Scarred skin and JP drain exiting mid abdomen with completely clear fluid in bulb (pt reports this is from the flush).   GU: no urinary catheter in place  SKIN: 1-2 dozen pinpoint, pinkish macules on R forearm, slightly faded in appearnce frmo yesterday. Pt noted a few similar dots on her R medial calf yesterday, but not visible to her or me this AM.  No rashes noted elsewhere on clothed exam today. Skin warm and dry.  . LUE PICC without surrounding cellulitis or drainage.   NEURO: CN II-XII grossly inact. moving all 4 extremities  PSYCH: normal mood and affect. Fully alert and oriented.           Patient Lines/Drains/Airways Status     Active Active Lines, Drains, & Airways     Name Placement date Placement time Site Days    Closed/Suction Drain Left LUQ Bulb 10 Fr. 09/10/21  1104  LUQ  13    Open Drain 1 Anterior;Left Neck 09/06/21  --  Neck  18    PICC Triple Lumen 08/21/21 Left Basilic 08/21/21  1332  Basilic  33                  Data for ID Decision Making  ( IDGENCONMDM )       Micro & Serological Data   ( RSLTMICRO  /  00CXNEW10  /  00CXSRC  /  00CXRES  /  00CXSUSC )  2/21 bld cx x2: p  2/19 bld cx x2: Staph hominis in 2/2 AND Staph epi in 2/2 (Mec A detected)  2/18 bld cx: Bacillus cereus (2/2), Staph hominis (2/2)  2/17 ucx neg  2/7 abd aspirate: gram: 2+ GPR. Cx: 4+ mixed GP/GN orgs, incl. 3+ Citrobacter freundii (R unasyn, aztreo, cefazolin, ceftaz, ctx, I zosyn)), 2+ PsA (pan suscep), 4+ Strep anginosus  1/28 HIV neg  Recent Studies  ( RISRSLT )  2/19 CT abd pelvis w/ IV ZOX:[id number]:  ??  1. Persistent dilatation of small bowel loops with several transition points compatible with complex small bowel obstruction with likely closed loop component. There is slight interval increase in enhancement of the affected bowel mucosa. Recommend correlation with lactate and/or surgical consultation for ischemia.  ??  2. Scattered foci of gas in the anterior peritoneum. These do not definitively technique with the enterocutaneous fistula and may represent focal walled off perforation.  ??  3. New enhancement of peritoneal ascites concerning for peritonitis.  ??  4. Decreased size of gas and fluid containing collection in the left anterior abdominal wall arising from the known enterocutaneous fistula status post drain placement.  ADDENDUM:   (09/22/2021 8:26 PM)  On review, the following additional findings were noted:  ??  Limited exam in the absence of oral contrast.   ??  Redemonstration of a complex enterocutaneous fistula with associated soft tissue stranding at the level of the anterior midline abdomen. There has been interval placement of a pigtail drainage catheter within the previously-described air and fluid collection along the left lower anterior abdominal wall, which is significantly decreased in size when compared to prior exam.  ??  Numerous persistently dilated loops of small bowel are seen throughout the abdomen and pelvis, which demonstrate air-fluid levels and measure up to 3.7 cm in diameter. There is similar tethering of the small bowel along the anterior peritoneum with abrupt collapse at this level, thought likely to reflect a transition point (2:72). The distal small bowel appears mostly decompressed to the level of the cecum. These findings remain worrisome for small bowel obstruction.   ??  Low attenuation abdominopelvic ascites (~18 HU) and diffuse peritoneal thickening/enhancement is similar to prior exam. While these findings, could potentially be seen in the setting of peritonitis, could also be seen with diffuse peritoneal disease spread and associated malignant ascites. Recommend clinical correlation.  ??  Elsewhere throughout the abdomen multiple loops of small demonstrate short segments of wall thickening, hyperenhancement, and luminal narrowing (2:63; 2:94), which may reflect sites of serosal involvement and associated stricturing        Initial Consult Documentation from September 23, 2021     Sources of information include: chart review, patient and treating providers.    History of Present Illness:     Ms. Folkes was admitted to [facility] 2/17 with persistent hypoglycemia to 30s despite holding long acting insulin. This started a few days  after discharge home 2/10 from her last admission. Reports she experienced periods of lightheadness, decreased peripheral vision and is told she had slurred speech during these episodes, during which her glucose was found to be low. She would also often feel hot or chilled when her sugar was low, not oat other times. No rigors.     Reports full adherence to levo/flagyl she was taking  for abdominal abscess s/p VIR drainage 2/7. She reports that either as soon as or within a day or two after going home the output into her abdominal/fistula site  JP drain stopped, with no drainage (other than the fluid she flushes in) since. She has had very scant greenish drainage from the fistula/abscess exit site area- enough to stain overlying dressing but not enough to soak through or really be visible on the skin. Reports her abdominal pain is unchanged.     Blood cultures were drawn, which grew Bacillus and a coag negative staph in both peripheral blood cultures. She has remained afebrile without infectious symptoms. After initial admission to the MICU, transferred to the floor.     When blood cultures grew, vancomycin was added.     Today, she noticed a R forearm rash with a few dozen small, pinpoint red macules. It is not itchy or painful. No rash or lesions she has noticed elsewhere.         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  -CT 12/2020 stable disease  - 01/23/21:??Diagnostic laparoscopy, exploratory laparotomy, unavoidable enterotomy with repair  -02/2021: noted to have EC fistuala,??OR on 7/2 for incision, exploration, and drainage of subcutaneous abscess??(6x8 cm) lateral to recent ex-lap incision. No definitive succus seen intra-op.??Treated conservatively with bowel rest and TPN for 1 month then had several I&D of recollected abscess at site of EC fistula with drain placed and left until clinical and imaging evidence of resolution????  -12/29-08/07/21 admission for partial SBO and abdominal wall fluid collection, biopsy of fluid collection consistent at first for upper GI or pancreatobiliary primary??  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 09/2021  -IDDM2  -poor nutrition  -anemia of chronic dz  -chronic pain on fentanyl patches    Meds and Allergies  Patient has a current medication list which includes the following prescription(s): acetaminophen, bisacodyl, calcium carbonate, cyclophosphamide, docusate sodium, famotidine, fentanyl, glucose, hydroxyzine, insulin lispro, insulin nph, lidocaine, metoclopramide, olanzapine, olanzapine, omeprazole, polyethylene glycol, prochlorperazine, scopolamine, senna, simethicone, sodium chloride, and [DISCONTINUED] fentanyl, and the following Facility-Administered Medications: acetaminophen, calcium carbonate, dexamethasone **FOLLOWED BY** [START ON 09/29/2021] dexamethasone, docusate sodium, enoxaparin, fentanyl, glucagon, lactated ringers, levofloxacin, lidocaine, metoclopramide, metronidazole, naloxone, olanzapine, olanzapine, pantoprazole, polyethylene glycol, scopolamine, senna, simethicone, vancomycin in 0.9 % nacl **AND** Inpatient consult to Pharmacy RX to dose: vancomycin.    Allergies: Carboplatin and Ketamine       Social History  Patient  reports that she has never smoked. She has never used smokeless tobacco. Lives at home.

## 2021-09-24 NOTE — Unmapped (Signed)
GYN ONC PROGRESS NOTE    Assessment and Plan:  Colleen Russo is a 46 y.o. 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 Patient discontinued from dextrose infusion. 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   Interval imaging with decreased previous fluid collection at [facility] site, new bowel edema c/f possible ischemia and increased pelvic fluid ,will discuss with primary team VIR consult for source control and fluid evaluation.    Onc: Recurrent Clear Cell Carcinoma of the Ovary   - Primary Oncologist: [name]  - s/p XL??with??radical tumor debulking including??total hysterectomy,??bilateral salpingo-oophorectomy, infra-gastric??omentectomy, and??washings??on 07/21/2019  -??s/p 6 cycles carbo/taxol, completed 12/05/19  -CT 2/022 concerning for progression, started on carbo/gem 2/22-5/24/22.  -CT 12/2020 stable disease  - 01/23/21:??Diagnostic laparoscopy, exploratory laparotomy, unavoidable enterotomy with repair  -02/2021: noted to have EC fistuala,??OR on 7/2 for incision, exploration, and drainage of subcutaneous abscess??(6x8 cm) lateral to recent ex-lap incision. No definitive succus seen intra-op.??Treated conservatively with bowel rest and TPN for 1 month then had several I&D of recollected abscess at site of EC fistula with drain placed and left until clinical and imaging evidence of resolution????  -12/29-08/07/21 admission for partial SBO and abdominal wall fluid collection, biopsy of fluid collection consistent at first for upper GI or pancreatobiliary primary??  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  - Last CA-125:??180 on 09/06/2021    Refractory Hypoglycemia - H/o Type 2 Diabetes   - Discharged 2/9 on NPH 20 units daily to take 1 hr prior to TPN > attempted to decrease as an outpatient and ultimately discontinued given symptomatic lows, last took subq insulin 2/16   - Per Endocrine, unclear etiology  - Management per Primary Team, Endocrine following, appreciate recommendations   - Dextrose infusion d/c'd 2/20, continues on hydrocortisone 25mg  BID   - Patient previously on steroids for nausea symptoms, no longer needed, okayy to  discontinue as appropriate per primary team   - Last hypoglycemic <50 2/18     Enterocutaneous Fistula - Hx abscesses - Bacteremia  - History of enterocutaneous fistula,??and abscesses requiring drainage.  - Most recently, abscess drain placement by VIR 2/7, initially on Zosyn > s/p Levofloxacin and Flagyl   - BCx positive   - Staph epidermidis, hominis, MRSA, Bacillus cereus   - ID consulted likely component of PICC colonization and intarabdominal source   - Repeat BCx this morning (48hrs)   - Currently on IV Vanc, Levo/Flagyl  - DDx includes EC fistula, superficial abscess, recurrent intraabdominal abscess, malposition of JP drain, PICC colonization  - 2/19 repeat CTAP with redemonstration of ECF, decreased air and fluid collection along left lower anterior abdominal wall which is significantly decreased s/p VIR drain 2/7 making new abdominal abscess less likely, however bowel edema with possible ischemic component  - Abdominal exam remains benign, patient afebrile and clinically well appearing without concerns for peritonitis.    Small bowel obstruction - TEG  - On admit denies abdominal pain, nausea, vomiting on admit. Previously tolerating clears and small bites of food at home   - 2/3 TEG placement  - Home reg: scopolamine patch, using Reglan and Compazine PRN  - 2/19 CTAP persistent small bowel obstruction, likely closed loop component with slight interval increase in enhancement of affected bowel mucosa  - 2/20  Colorectal surgery cs: agree ECF does not appear to be the source of bacteremia given drainage of previously existing fluid collections. Recommend VIR for singoram to evaluate drain at site of ECF  - F/u VIR consult per primary team  ??  Sick Euthyroid Syndrome  - Last admit TSH 0.237, FT4 0.54  ????  Chronic Pain  - S/p Palliative Care consult, pain well controlled on 50 mcg fentanyl patches and Tylenol PRN  ??  Poor Nutrition  - S/p Nutrition consult 2/18  - TPN held in setting of bacteremia  - Currently NPO  ??  Anemia of Chronic Disease   - Admit Hgb 7.9 stable from prior  ??  Prophylaxis: Lovenox  ??  Code Status: full code, confirmed on admission.   ??  Disposition: floor    Plan discussed with Dr. Sherlean Foot. Attending Dr. Reyne Dumas immediately available.    Subjective:   Patient continues to feel fine. Reports good pain control. Denies any N/V, no abdominal pain.     Objective:  Temp:  [36.4 ??C (97.6 ??F)-37 ??C (98.6 ??F)] 36.6 ??C (97.9 ??F)  Heart Rate:  [81-112] 86  SpO2 Pulse:  [88-116] 88  Resp:  [11-23] 13  BP: (96-123)/(63-82) 118/81  SpO2:  [98 %-100 %] 99 %    -General:                    Well-appearing in NAD.  -CV:                             Regular rate and rhythm. Port site without surrounding erythema, wel appearing  -Chest:                        CTAB. Normal WOB. PICC site without erythema, induration or drainage.   -Abd:                           Non-tender, non-distended abdomen.  Abdominal incisions well healed. TEG site and JP drain sites without leakage. No ouput in JP drain bulb. EC site palpated, no discharge, erythema or induration, dressing is dry  -Extremities:              No clubbing, erythema, or edema.   -Psych:                       Appropriate.The patient is awake and alert.    Medications (scheduled)   ??? enoxaparin (LOVENOX) injection  40 mg Subcutaneous Q24H   ??? fentaNYL  1 patch Transdermal Q72H   ??? hydrocortisone  25 mg Oral BID   ??? levoFLOXacin  750 mg Intravenous Q24H   ??? lidocaine  1 patch Transdermal Daily   ??? metroNIDAZOLE  500 mg Intravenous [facility]   ??? OLANZapine  2.5 mg Oral Daily   ??? OLANZapine  5 mg Oral Nightly   ??? pantoprazole  40 mg Oral Daily   ??? polyethylene glycol  17 g Oral Daily   ??? scopolamine  1 patch Topical Q72H   ??? vancomycin  1,250 mg Intravenous Q8H       Medications (prn)  acetaminophen, calcium carbonate, glucagon, metoclopramide, naloxone, senna, simethicone    Labs:   Lab Results   Component Value Date    WBC 2.9 (L) 09/23/2021    HGB 13.4 09/23/2021    HCT 40.4 09/23/2021  PLT 181 09/23/2021       Lab Results   Component Value Date    NA 137 09/23/2021    K 3.6 09/23/2021    CL 104 09/23/2021    CO2 26.0 09/23/2021    BUN 7 (L) 09/23/2021    CREATININE 0.44 (L) 09/23/2021    GLU 163 09/23/2021    GLU 163 09/23/2021    CALCIUM 8.7 09/23/2021    MG 1.5 (L) 09/23/2021    PHOS 3.5 09/23/2021       Lab Results   Component Value Date    BILITOT 0.2 (L) 09/23/2021    BILIDIR 0.10 09/11/2021    PROT 5.5 (L) 09/23/2021    ALBUMIN 2.4 (L) 09/23/2021    ALT 11 09/23/2021    AST 15 09/23/2021    ALKPHOS 67 09/23/2021       Lab Results   Component Value Date    INR 1.14 09/21/2021    APTT 30.7 09/21/2021     Scribe's Attestation: Lance Muss, MD obtained and performed the history, physical exam and medical decision making elements that were  entered into the chart. Documentation assistance was provided by me personally, a scribe. Signed by Freddie Apley Scribe, on September 24, 2021 at 4:53 AM.       ----------------------------------------------------------------------------------------------------------------------  September 24, 2021 6:29 AM. Documentation assistance provided by the Scribe. I was present during the time the encounter was recorded. The information recorded by the Scribe was done at my direction and has been reviewed and validated by me.  ----------------------------------------------------------------------------------------------------------------------

## 2021-09-24 NOTE — Unmapped (Signed)
Hospital Medicine Daily Progress Note    Assessment/Plan:    Principal Problem:    Hypoglycemia  Active Problems:    Ovarian cancer (CMS-HCC)    Type 2 diabetes mellitus (CMS-HCC)    Sick-euthyroid syndrome    Enterocutaneous fistula    Abscess    Poor nutrition    Anemia of chronic disease  Resolved Problems:    * No resolved hospital problems. *        Wound 08/19/21 Abdomen Mid open skin wound (Active)   Wound Image    08/27/21 1119   Dressing Status      Dry;Clean 09/12/21 1400   State of Healing Closed wound edges 09/11/21 0800   Wound Length (cm) 0.2 cm 08/27/21 1119   Wound Width (cm) 0.2 cm 08/27/21 1119   Wound Surface Area (cm^2) 0.04 cm^2 08/27/21 1119   Wound Bed Pink 08/26/21 1100   Odor None 09/10/21 2324   Peri-wound Assessment      Clean;Dry;Intact 09/12/21 1400   Exudate Type      Green;Tan 09/10/21 1400   Exudate Amnt      None 09/12/21 1400   Tunneling      No 09/12/21 1400   Undermining     No 09/10/21 1400   Treatments Cleansed/Irrigation 09/08/21 0600   Dressing Abdominal dressing (ABD);Dry gauze 09/12/21 1400           Colleen Russo is a 46 y.o. female who presented to [facility] with Hypoglycemia.    Severe hypoglycemia, symptomatic She had measured blood glucose <55 with typical symptoms. Last reported insulin administration was 2/16. Initial hypoglycemia labs consistent with insulin dependent process either exogenous or endogenous.   - Endocrine consulted, appreciate recs  - Stop D10 and monitor for hypoglycemia with labs if <50 (glucose, insulin, c-peptide, betahydroxybutyrate) followed by glucagon challenge  - Hydrocortisone 25 mg Q12, will wean towards physiologic doses soon  - Stepdown status    Bacteremia She has a left upper extremity PICC for TPN and has peripheral blood cultures growing B cereus and Staph hominis. Repeat cultures growing Staph epi and Staph hominis. She is vancomycin for bacteremia and levofloxacin / metronidazole for enterocutaneous fistula with recent abscess. She also has small volume ascites with peritoneal thickening either consistent with peritonitis or carcinomatosis. She will need the PICC removed and new access for TPN placed once hypoglycemia resolved.  - Appreciate Infectious Disease assistance  - Repeat peripheral blood cultures 2/21  - Continue vancomycin  - Remove PICC once hypoglycemia resolved    Malnutrition secondary to chronic bowel obstruction requiring TPN  - Holding TPN in setting of bacteremia and diagnostic evaluation of hypoglycemia  ??  Chronic small bowel obstruction??- TEG Tolerating clears and bites of food at home.  - Continue home scopolamine patch, using Reglan PRN  - Appreciate Gynecology Oncology and Surgical Oncology evaluations  - NPO except meds   ??  Enterocutaneous Fistula - Hx abscesses She has a of enterocutaneous fistula,??and abscesses requiring drainage. Most recently, abscess drained by VIR 2/7 with drain placement, initially on??Zosyn but transitioned to levofloxacin and metronidazole at discharge with expected course until 2/19. She is concerned that drain has had little output and she still feels an area of induration. CT ABD with reduced abdominal wall fluid collection after drain placement.  - Continue levofloxacin and metronidazole  - VIR consult for evaluation of drain and possible ascites sampling    Recurrent stage III clear cell carcinoma of the ovary with peritoneal carcinomatosis  -  Primary Oncologist: [name]  - Appreciate any OB/Gyn recs    Chronic??Pain Pain well??controlled  - 50 mcg/hr fentanyl patch and??Tylenol PRN    DVT Ppx: Lovenox  Code Status: Full    ___________________________________________________________________    Subjective:    She denies further hypoglycemia symptoms. Reviewed enterocutaneous fistula and infectious evaluation with patient and gynecology oncology.     Recent Results (from the past 24 hour(s))   POCT Glucose    Collection Time: 09/22/21  4:34 PM   Result Value Ref Range    Glucose, POC 120 70 - 179 mg/dL   POCT Glucose    Collection Time: 09/22/21  7:54 PM   Result Value Ref Range    Glucose, POC 125 70 - 179 mg/dL   Glucose, Random    Collection Time: 09/22/21  8:38 PM   Result Value Ref Range    Glucose 382 (H) 70 - 179 mg/dL   Lactate, Venous, Whole Blood    Collection Time: 09/22/21  8:38 PM   Result Value Ref Range    Lactate, Venous 1.1 0.5 - 1.8 mmol/L   CBC    Collection Time: 09/22/21  8:38 PM   Result Value Ref Range    WBC 6.0 3.6 - 11.2 10*9/L    RBC 2.91 (L) 3.95 - 5.13 10*12/L    HGB 8.0 (L) 11.3 - 14.9 g/dL    HCT 45.4 (L) 09.8 - 44.0 %    MCV 82.2 77.6 - 95.7 fL    MCH 27.5 25.9 - 32.4 pg    MCHC 33.4 32.0 - 36.0 g/dL    RDW 11.9 (H) 14.7 - 15.2 %    MPV 7.1 6.8 - 10.7 fL    Platelet 290 150 - 450 10*9/L   Basic Metabolic Panel    Collection Time: 09/22/21  8:38 PM   Result Value Ref Range    Sodium 135 135 - 145 mmol/L    Potassium 3.8 3.4 - 4.8 mmol/L    Chloride 102 98 - 107 mmol/L    CO2 27.0 20.0 - 31.0 mmol/L    Anion Gap 6 5 - 14 mmol/L    BUN 6 (L) 9 - 23 mg/dL    Creatinine 8.29 (L) 0.60 - 0.80 mg/dL    BUN/Creatinine Ratio 13     eGFR CKD-EPI (2021) Female >90 >=60 mL/min/1.77m2    Glucose 382 (H) 70 - 179 mg/dL    Calcium 8.6 (L) 8.7 - 10.4 mg/dL   Aerobic/Anaerobic Culture    Collection Time: 09/22/21  9:54 PM    Specimen: Abdomen; Aspirate   Result Value Ref Range    Aerobic/Anaerobic Culture TOO YOUNG TO READ     Gram Stain Result 3+ Polymorphonuclear leukocytes     Gram Stain Result 1+ Mixed Gram Positive Organisms    POCT Glucose    Collection Time: 09/23/21  1:48 AM   Result Value Ref Range    Glucose, POC 206 (H) 70 - 179 mg/dL   CBC    Collection Time: 09/23/21  4:20 AM   Result Value Ref Range    WBC 2.9 (L) 3.6 - 11.2 10*9/L    RBC 4.91 3.95 - 5.13 10*12/L    HGB 13.4 11.3 - 14.9 g/dL    HCT 56.2 13.0 - 86.5 %    MCV 82.3 77.6 - 95.7 fL    MCH 27.3 25.9 - 32.4 pg    MCHC 33.2 32.0 - 36.0 g/dL    RDW 78.4 (H) 69.6 -  15.2 %    MPV 7.1 6.8 - 10.7 fL    Platelet 181 150 - 450 10*9/L   Comprehensive Metabolic Panel    Collection Time: 09/23/21  4:20 AM   Result Value Ref Range    Sodium 137 135 - 145 mmol/L    Potassium 3.6 3.4 - 4.8 mmol/L    Chloride 104 98 - 107 mmol/L    CO2 26.0 20.0 - 31.0 mmol/L    Anion Gap 7 5 - 14 mmol/L    BUN 7 (L) 9 - 23 mg/dL    Creatinine 5.78 (L) 0.60 - 0.80 mg/dL    BUN/Creatinine Ratio 16     eGFR CKD-EPI (2021) Female >90 >=60 mL/min/1.73m2    Glucose 163 70 - 179 mg/dL    Calcium 8.7 8.7 - 46.9 mg/dL    Albumin 2.4 (L) 3.4 - 5.0 g/dL    Total Protein 5.5 (L) 5.7 - 8.2 g/dL    Total Bilirubin 0.2 (L) 0.3 - 1.2 mg/dL    AST 15 <=62 U/L    ALT 11 10 - 49 U/L    Alkaline Phosphatase 67 46 - 116 U/L   Magnesium Level    Collection Time: 09/23/21  4:20 AM   Result Value Ref Range    Magnesium 1.5 (L) 1.6 - 2.6 mg/dL   Phosphorus Level    Collection Time: 09/23/21  4:20 AM   Result Value Ref Range    Phosphorus 3.5 2.4 - 5.1 mg/dL   Glucose, Random    Collection Time: 09/23/21  4:20 AM   Result Value Ref Range    Glucose 163 70 - 179 mg/dL   Lactate, Venous, Whole Blood    Collection Time: 09/23/21  4:20 AM   Result Value Ref Range    Lactate, Venous 1.3 0.5 - 1.8 mmol/L   POCT Glucose    Collection Time: 09/23/21  4:44 AM   Result Value Ref Range    Glucose, POC 158 70 - 179 mg/dL   POCT Glucose    Collection Time: 09/23/21  9:06 AM   Result Value Ref Range    Glucose, POC 82 70 - 179 mg/dL   POCT Glucose    Collection Time: 09/23/21 10:53 AM   Result Value Ref Range    Glucose, POC 55 (L) 70 - 179 mg/dL   POCT Glucose    Collection Time: 09/23/21 12:12 PM   Result Value Ref Range    Glucose, POC 87 70 - 179 mg/dL   POCT Glucose    Collection Time: 09/23/21  2:13 PM   Result Value Ref Range    Glucose, POC 94 70 - 179 mg/dL   POCT Glucose    Collection Time: 09/23/21  3:46 PM   Result Value Ref Range    Glucose, POC 103 70 - 179 mg/dL     Labs/Studies:  Labs and Studies from the last 24hrs per EMR and Reviewed    Objective:  Temp:  [36.6 ??C (97.9 ??F)-37.1 ??C (98.7 ??F)] 36.9 ??C (98.4 ??F)  Heart Rate:  [92-112] 110  SpO2 Pulse:  [89-116] 108  Resp:  [12-22] 22  BP: (105-126)/(63-92) 123/75  SpO2:  [97 %-100 %] 100 %    CONSTITUTIONAL: Alert, in no acute distress  HEENT: Normocephalic, atraumatic  NECK: Left neck with enteral feeding tube for decompression  CARDIOVASCULAR: Normal rate, rhythm sounds regular, no murmur  PULM: Clear to auscultation bilaterally, normal work of breathing  GASTROINTESTINAL: Soft, nondistended, area of  subcutaneous firmness above her umbilicus, no active drainage or cellulitis  MSK: No obvious deformities, PICC left upper extremity without cellulitis  NEUROLOGIC: No focal deficits    I personally spent 60 minutes face-to-face and non-face-to-face in the care of this patient, which includes all pre, intra, and post visit time on the date of service.  All documented time was specific to the E/M visit and does not include any procedures that may have been performed.

## 2021-09-24 NOTE — Unmapped (Signed)
Patient is A&Ox4. VSS, NSR per tele. O2 Saturation WNL on room air. Afebrile. No complaints of pain this shift. BG checked Q2H per order. Patient remains NPO. LR infusion started per order.    Problem: Adult Inpatient Plan of Care  Goal: Plan of Care Review  Outcome: Progressing  Goal: Patient-Specific Goal (Individualized)  Outcome: Progressing  Goal: Absence of Hospital-Acquired Illness or Injury  Outcome: Progressing  Intervention: Identify and Manage Fall Risk  Recent Flowsheet Documentation  Taken 09/23/2021 2000 by Garlon Hatchet, RN  Safety Interventions:  ??? bleeding precautions  ??? fall reduction program maintained  ??? family at bedside  ??? lighting adjusted for tasks/safety  ??? low bed  ??? nonskid shoes/slippers when out of bed  Intervention: Prevent Skin Injury  Recent Flowsheet Documentation  Taken 09/24/2021 0000 by Garlon Hatchet, RN  Skin Protection: adhesive use limited  Taken 09/23/2021 2200 by Garlon Hatchet, RN  Skin Protection: adhesive use limited  Taken 09/23/2021 2000 by Garlon Hatchet, RN  Skin Protection: adhesive use limited  Intervention: Prevent and Manage VTE (Venous Thromboembolism) Risk  Recent Flowsheet Documentation  Taken 09/24/2021 0000 by Garlon Hatchet, RN  VTE Prevention/Management: anticoagulant therapy  Taken 09/23/2021 2000 by Garlon Hatchet, RN  VTE Prevention/Management: anticoagulant therapy  Intervention: Prevent Infection  Recent Flowsheet Documentation  Taken 09/23/2021 2000 by Garlon Hatchet, RN  Infection Prevention: hand hygiene promoted  Goal: Optimal Comfort and Wellbeing  Outcome: Progressing  Goal: Readiness for Transition of Care  Outcome: Progressing  Goal: Rounds/Family Conference  Outcome: Progressing     Problem: Self-Care Deficit  Goal: Improved Ability to Complete Activities of Daily Living  Outcome: Progressing     Problem: Skin Injury Risk Increased  Goal: Skin Health and Integrity  Outcome: Progressing  Intervention: Optimize Skin Protection  Recent Flowsheet Documentation  Taken 09/24/2021 0000 by Garlon Hatchet, RN  Pressure Reduction Techniques: frequent weight shift encouraged  Skin Protection: adhesive use limited  Taken 09/23/2021 2200 by Garlon Hatchet, RN  Pressure Reduction Techniques: frequent weight shift encouraged  Skin Protection: adhesive use limited  Taken 09/23/2021 2000 by Garlon Hatchet, RN  Pressure Reduction Techniques: frequent weight shift encouraged  Head of Bed (HOB) Positioning: HOB at 30-45 degrees  Skin Protection: adhesive use limited

## 2021-09-24 NOTE — Unmapped (Signed)
Hospital Medicine Daily Progress Note    Assessment/Plan:    Principal Problem:    Hypoglycemia  Active Problems:    Ovarian cancer (CMS-HCC)    Type 2 diabetes mellitus (CMS-HCC)    Sick-euthyroid syndrome    Enterocutaneous fistula    Abscess    Poor nutrition    Anemia of chronic disease  Resolved Problems:    * No resolved hospital problems. *        Wound 08/19/21 Abdomen Mid open skin wound (Active)   Wound Image    08/27/21 1119   Dressing Status      Dry;Clean 09/12/21 1400   State of Healing Closed wound edges 09/11/21 0800   Wound Length (cm) 0.2 cm 08/27/21 1119   Wound Width (cm) 0.2 cm 08/27/21 1119   Wound Surface Area (cm^2) 0.04 cm^2 08/27/21 1119   Wound Bed Pink 08/26/21 1100   Odor None 09/10/21 2324   Peri-wound Assessment      Clean;Dry;Intact 09/12/21 1400   Exudate Type      Green;Tan 09/10/21 1400   Exudate Amnt      None 09/12/21 1400   Tunneling      No 09/12/21 1400   Undermining     No 09/10/21 1400   Treatments Cleansed/Irrigation 09/08/21 0600   Dressing Abdominal dressing (ABD);Dry gauze 09/12/21 1400           Colleen Russo is a 46 y.o. female who presented to [facility] with Hypoglycemia.    Severe hypoglycemia, symptomatic She had measured blood glucose <55 with typical symptoms. Last reported insulin administration was 2/16. Initial hypoglycemia labs consistent with insulin dependent process either exogenous or endogenous. She required 24 hours off dextrose fluids to again drop below 55 and repeat hypoglycemia labs sent.   - Endocrine consulted, appreciate recs  - Restart IV fluids D5 1/2 NS at 75 ml/hr (she was previously euglycemic on D10 50 ml/hr); can increase if needed   - Stepdown status    Bacteremia She has a left upper extremity PICC for TPN and has peripheral blood cultures growing B cereus and Staph hominis. Repeat cultures growing Staph epi and Staph hominis. She is vancomycin for bacteremia and levofloxacin / metronidazole for enterocutaneous fistula with recent abscess. She also has small volume ascites with peritoneal thickening either consistent with peritonitis or carcinomatosis. She will need the PICC removed and new access for TPN placed once hypoglycemia resolved. Currently would need multiple PIVs for her IV antibiotics. Repeat cultures drawn 2/21 pending. Discussed with nurse; trying to obtain PIV and will evaluate if levofloxacin and metronidazole can be switched to PO.  - Appreciate Infectious Disease assistance  - Repeat peripheral blood cultures 2/21  - Continue vancomycin  - Remove PICC once hypoglycemia resolved and adequate PIV access    Malnutrition secondary to chronic bowel obstruction requiring TPN  - Holding TPN in setting of bacteremia and diagnostic evaluation of hypoglycemia  ??  Chronic small bowel obstruction??- TEG Tolerating clears and bites of food at home.  - Continue home scopolamine patch, using Reglan PRN  - Appreciate Gynecology Oncology and Surgical Oncology evaluations  - NPO except meds   ??  Enterocutaneous Fistula - Hx abscesses She has a of enterocutaneous fistula,??and abscesses requiring drainage. Most recently, abscess drained by VIR 2/7 with drain placement, initially on??Zosyn but transitioned to levofloxacin and metronidazole at discharge with expected course until 2/19. She is concerned that drain has had little output and she still feels an area  of induration. CT ABD with reduced abdominal wall fluid collection after drain placement. VIR recommends leaving drain in place until [facility] fistula has resolved as they expect the fluid collection will return.  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 She denies confusion, sweating or increased nausea. Discussed VIR evaluation of drain and possible paracentesis.     Recent Results (from the past 24 hour(s))   POCT Glucose    Collection Time: 09/23/21  2:13 PM   Result Value Ref Range    Glucose, POC 94 70 - 179 mg/dL   POCT Glucose    Collection Time: 09/23/21  3:46 PM   Result Value Ref Range    Glucose, POC 103 70 - 179 mg/dL   POCT Glucose    Collection Time: 09/23/21  6:21 PM   Result Value Ref Range    Glucose, POC 116 70 - 179 mg/dL   POCT Glucose    Collection Time: 09/23/21  8:05 PM   Result Value Ref Range    Glucose, POC 122 70 - 179 mg/dL   POCT Glucose    Collection Time: 09/23/21 10:14 PM   Result Value Ref Range    Glucose, POC 101 70 - 179 mg/dL   POCT Glucose    Collection Time: 09/24/21 12:26 AM   Result Value Ref Range    Glucose, POC 80 70 - 179 mg/dL   POCT Glucose    Collection Time: 09/24/21  2:11 AM   Result Value Ref Range    Glucose, POC 78 70 - 179 mg/dL   POCT Glucose    Collection Time: 09/24/21  4:20 AM   Result Value Ref Range    Glucose, POC 64 (L) 70 - 179 mg/dL   POCT Glucose    Collection Time: 09/24/21  5:16 AM   Result Value Ref Range    Glucose, POC 69 (L) 70 - 179 mg/dL   CBC    Collection Time: 09/24/21  6:06 AM   Result Value Ref Range    WBC 7.2 3.6 - 11.2 10*9/L    RBC 2.91 (L) 3.95 - 5.13 10*12/L    HGB 7.7 (L) 11.3 - 14.9 g/dL    HCT 16.1 (L) 09.6 - 44.0 %    MCV 82.9 77.6 - 95.7 fL    MCH 26.6 25.9 - 32.4 pg    MCHC 32.1 32.0 - 36.0 g/dL    RDW 04.5 (H) 40.9 - 15.2 %    MPV 6.9 6.8 - 10.7 fL    Platelet 305 150 - 450 10*9/L   Comprehensive Metabolic Panel    Collection Time: 09/24/21  6:06 AM   Result Value Ref Range    Sodium 143 135 - 145 mmol/L    Potassium 3.6 3.4 - 4.8 mmol/L    Chloride 109 (H) 98 - 107 mmol/L    CO2 27.0 20.0 - 31.0 mmol/L    Anion Gap 7 5 - 14 mmol/L BUN 8 (L) 9 - 23 mg/dL    Creatinine 8.11 9.14 - 0.80 mg/dL  BUN/Creatinine Ratio 13     eGFR CKD-EPI (2021) Female >90 >=60 mL/min/1.79m2    Glucose 61 (L) 70 - 179 mg/dL    Calcium 8.7 8.7 - 09.8 mg/dL    Albumin 2.4 (L) 3.4 - 5.0 g/dL    Total Protein 5.2 (L) 5.7 - 8.2 g/dL    Total Bilirubin 0.3 0.3 - 1.2 mg/dL    AST 15 <=11 U/L    ALT 10 10 - 49 U/L    Alkaline Phosphatase 58 46 - 116 U/L   Magnesium Level    Collection Time: 09/24/21  6:06 AM   Result Value Ref Range    Magnesium 1.7 1.6 - 2.6 mg/dL   Phosphorus Level    Collection Time: 09/24/21  6:06 AM   Result Value Ref Range    Phosphorus 4.4 2.4 - 5.1 mg/dL   POCT Glucose    Collection Time: 09/24/21  6:11 AM   Result Value Ref Range    Glucose, POC 60 (L) 70 - 179 mg/dL   Vancomycin, Trough    Collection Time: 09/24/21  8:19 AM   Result Value Ref Range    Vancomycin Tr 31.2 (HH) 10.0 - 20.0 ug/mL   Glucose, Random    Collection Time: 09/24/21  8:53 AM   Result Value Ref Range    Glucose 52 (L) 70 - 179 mg/dL   Insulin Level, Total    Collection Time: 09/24/21  8:53 AM   Result Value Ref Range    Insulin 14.3 2.6 - 37.6 mU/L   Beta Hydroxybutyrate    Collection Time: 09/24/21  8:53 AM   Result Value Ref Range    BETAHYDRO 0.36 (H) 0.02 - 0.27 mmol/L   C-Peptide    Collection Time: 09/24/21  8:53 AM   Result Value Ref Range    C-Peptide 0.14 (L) 0.48 - 5.05 ng/mL   POCT Glucose    Collection Time: 09/24/21  9:58 AM   Result Value Ref Range    Glucose, POC 64 (L) 70 - 179 mg/dL   POCT Glucose    Collection Time: 09/24/21 11:11 AM   Result Value Ref Range    Glucose, POC 91 70 - 179 mg/dL   POCT Glucose    Collection Time: 09/24/21 12:09 PM   Result Value Ref Range    Glucose, POC 79 70 - 179 mg/dL   POCT Glucose    Collection Time: 09/24/21  2:05 PM   Result Value Ref Range    Glucose, POC 80 70 - 179 mg/dL     Labs/Studies:  Labs and Studies from the last 24hrs per EMR and Reviewed    Objective:  Temp:  [36.4 ??C (97.6 ??F)-37 ??C (98.6 ??F)] 36.9 ??C (98.4 ??F)  Heart Rate:  [81-110] 95  SpO2 Pulse:  [88-108] 95  Resp:  [11-23] 16  BP: (87-151)/(62-92) 98/62  SpO2:  [99 %-100 %] 100 %    CONSTITUTIONAL: Alert, in no acute distress  HEENT: Normocephalic, atraumatic  NECK: Left neck with enteral feeding tube for decompression  CARDIOVASCULAR: Normal rate, rhythm sounds regular, no murmur  PULM: Clear to auscultation bilaterally, normal work of breathing  GASTROINTESTINAL: Soft, nondistended, area of subcutaneous firmness above her umbilicus, no active drainage or cellulitis  MSK: No obvious deformities, PICC left upper extremity without cellulitis  NEUROLOGIC: No focal deficits    I personally spent 60 minutes face-to-face and non-face-to-face in the care of this patient, which includes all pre, intra, and post visit time  on the date of service.  All documented time was specific to the E/M visit and does not include any procedures that may have been performed.

## 2021-09-24 NOTE — Unmapped (Signed)
Endocrine Team Diabetes Follow Up Consult Note     Consult information:  Requesting Attending Physician : [name], *  Service Requesting Consult : Med [facility])  Primary Care Provider: Carver Fila, MD  Impression:  Colleen Russo is a 46 y.o. female admitted for Hypolglycemia. We have been consulted at the request of [name], * to evaluate [name] for hyperglycemia.     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   2. Nutrition: Complicating glycemic control. Increasing risk for both hypoglycemia and hyperglycemia.  3. Ovarian cancer . Complicating glycemic control and increasing risk for severe hyperglycemia and severe hypoglycemia      Studies reviewed 09/24/21:  Labs: POCT-BG and c-peptide, BOHB, Insulin level   Interpretation: Patient blood sugar has range 60-122 mg/dL. None < 50 mg/dL.  Had serum BG of 52 this morning and labs where sent. C-peptide low, insulin levels normal and elevated Beta hydroxybutyrate   Notes reviewed: Primary team and nursing notes      Overall impression based on above reviews and history:  D10 drip discontinued yesterday. This morning patient had low blood sugar, serum blood sugar was 52 and labs ware drawn. Her labs are showing again low c-peptide, but this time she has inappropriately normal and elevated Ketones. With these data points and the ones from 2/18 there several things that one can hypothesis. These labs still suggest some insulin from an external source. Today's labs support the notion that this was a transient thing. Furthermore, the elevated Beta hydroxybutyrate is indicative of counter regulatory mechanisms against starvation and the release of insulin hold on hepatic fatty acid oxidation. It seems that patient was giving insulin more than previously endorsed and that it may be that she actually gave insulin right before coming to the ED. Patient is currently getting treatment for bacteremia and will be getting line exchange with plants to initiate again TPN. If we take this hypothesis to be true, then it would be interesting to see what her blood sugars do with re-initiation of TPN. I would consider getting a sense of her corrective needs and placing insulin in the bag after the fact. As stated before she can de-escalate steroids like below.     Recommendations:  - Monitor glucose per unit protocols   - We can see patient once TPN is initiated to get a sense of her corrective needs and calculate a carb ratio to put in the bag.   - Dexamethasone 1 mg (aprox. 26 mg of HC). We will defer to GynOnc continuation vs stopping. If deciding to taper, would taper slower than before. An example would be, 1 mg for 5 days, followed by 0.5 mg for 7 days and stopping there after.       Thank you for this consult. Discussed plan with primary team. We will continue to follow and make recommendations and place orders as appropriate.    Please page with questions or concerns: Endocrine fellow on call: [phone]    Subjective:  Interval history:   Doing well. No major complaints  Fasting blood sugar number of 60-61 mg/dL. Hypoglycemia this morning and labs where drawn. Minimal juice admin. Glucagon given.       Initial HPI:  Colleen Russo is a 46 y.o. 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 We have been consulted at the request of [name], *  to evaluate Tynetta for hyperglycemia.     Patient was recently discharged from the hospital after spending about 4 weeks for SBO, EC fistula with abscess and nausea/vomiting. During this hospitalization, she developed TPN induced hyperglycemia, which was managed with correctional insulin and NPH. She was discharged in a stable state with TPN and recommendations of NPH 20 units at the start of the TPN. Per documentation, the TPN had no insulin in the bag but did have octreotide. Another thing to mentioned is that patient had been taking Dexamethasone prior to admission and while admitted she was taking up to 8 mg of dexamethasone and was discharge on taper of 2 mg for 3 days, followed by 1 mg for 7 days.     After discharge patient had been having low blood sugar at the end of her TPN. This similar pattern was happening during hospitalization but her glucose decreased to normal range and never had hypoglycemia. Patient started reporting values < 70 mg/dL on 1/61, again at around 9-10 am after he TPN stopped. This episodes where accompanied with some lethargy which got better with candy. Interestly, her blood sugar would be < 70 and with smal piece of candy would jump into the 300s. Patient continued to have episodes an her NPH was discontinued ( after being reduced in half on 2/15) on 2/16. On 6:19 PM patient took 4 units for a BG 295 mg/dL and her blood sugar drop to 159 mg/dL, and that is the last time she had any form of insulin.     Per documentation, octreotide was being use to control fistula output and help with nausea and vomiting.     She received aggressive measures overnight 09/21/2021 including stress dose steroids with stabilization of blood glucoses. We recommended a modified fast by discontinuation of dextrose infusion. Stopped at 1113 on 09/21/2021 and at aproximately 1700 patient had POC of 50 with serum of <55. Labs where collected prior to dextrose admin. Patient had glucagon admin with monitor of her BG 15 and 30 min after. She was found to have approprietly low C peptide, high insulin levels,  low normal ketones, >30 points increase after glucagon admin.     Diabetes History:  Patient has a history of TPN induced hyperglycemia diagnosed 09/12/2021.  Diabetes is managed by: primary  Current home diabetes regimen: was taking 20 units of NPH at the start of her TPN but recently stopped due to recurrent hypoglycemia.   Current home blood glucose monitoring:  Glucometer   Hypoglycemia awareness: yes   Complications related to diabetes: none known    Current Diabetes Inpatient Regimen:  None     Current Nutrition:  Active Orders   Diet    NPO Sips with meds; Medically necessary         ROS: As per HPI.    ??? enoxaparin (LOVENOX) injection  40 mg Subcutaneous Q24H   ??? fentaNYL  1 patch Transdermal Q72H   ??? hydrocortisone  25 mg Oral BID   ??? levoFLOXacin  750 mg Intravenous Q24H   ??? lidocaine  1 patch Transdermal Daily   ??? metroNIDAZOLE  500 mg Intravenous [facility]   ??? OLANZapine  2.5 mg Oral Daily   ??? OLANZapine  5 mg Oral Nightly   ??? pantoprazole  40 mg Oral Daily   ??? polyethylene glycol  17 g Oral Daily   ??? scopolamine  1 patch Topical Q72H   ??? vancomycin  1,250 mg Intravenous Q8H  Current Outpatient Medications   Medication Instructions   ??? acetaminophen (TYLENOL) 650 mg, Oral, Every 6 hours PRN   ??? bisacodyL (DULCOLAX) 10 mg, Rectal, Daily PRN   ??? calcium carbonate (TUMS) 200 mg calcium (500 mg) chewable tablet 200 mg of elem calcium, Oral, 3 times a day PRN   ??? cycloPHOSphamide (CYTOXAN) 50 mg, Oral, Daily (standard), Do not start until discussing with your oncologist   ??? docusate sodium (COLACE) 100 mg, Oral, Daily (standard)   ??? famotidine (PEPCID) 20 mg, Oral, Daily (standard)   ??? fentaNYL (DURAGESIC) 50 mcg/hr patch 1 patch, Transdermal, Every 72 hours   ??? GAS RELIEF 80 (SIMETHICONE) 80 mg, Oral, Every 6 hours PRN   ??? glucose 16 g, Oral, Every 10 min PRN   ??? hydrOXYzine (ATARAX) 25 mg, Oral, Every 6 hours PRN   ??? insulin lispro (HUMALOG) 100 unit/mL injection pen Inject 0-0.2 mL (0-20 Units total) under the skin every six (6) hours as needed. Administer number of insulin units based on your blood glucose: BG 160-200 = 1 unit BG 201-240 = 4 units BG 241-280 = 6 units BG 281-300 = 8 units BG > 300 = 10 units and notify provider   ??? insulin NPH (HUMULIN N NPH U-100 INSULIN) 100 unit/mL injection Inject 20 Units under the skin daily. Inject 1 hour prior to starting TPN infusion   ??? lidocaine 4 % patch Place 1 patch on the skin daily. Apply to affected area for 12 hours only each day (then remove patch).   ??? metoclopramide (REGLAN) 10 mg, Oral, Every 8 hours   ??? OLANZapine (ZYPREXA) 5 mg, Oral, Nightly   ??? OLANZapine (ZYPREXA) 2.5 mg, Oral, Daily (standard)   ??? omeprazole (PRILOSEC) 40 mg, Oral, Daily (standard)   ??? polyethylene glycol (GLYCOLAX) 17 g, Oral, Daily (standard)   ??? prochlorperazine (COMPAZINE) 10 mg, Oral, Every 8 hours PRN   ??? scopolamine (TRANSDERM-SCOP) 1 mg over 3 days 1 patch, Transdermal, Every 72 hours   ??? senna (SENOKOT) 8.6 mg tablet 2 tablets, Oral, 2 times a day PRN   ??? sodium chloride (NS) 0.9 % injection Use 5 mL by Intra-cannular route daily.           No past medical history on file.    Past Surgical History:   Procedure Laterality Date   ??? IR INSERT G-TUBE PERCUTANEOUS  09/06/2021    IR INSERT G-TUBE PERCUTANEOUS 09/06/2021 Gwenlyn Fudge, MD IMG VIR [facility]       No family history on file.    Social History     Tobacco Use   ??? Smoking status: Never   ??? Smokeless tobacco: Never       OBJECTIVE:  BP 118/81  - Pulse 86  - Temp 36.6 ??C (97.9 ??F) (Oral)  - Resp 13  - Ht 157.5 cm (5' 2)  - Wt 74.2 kg (163 lb 9.3 oz)  - SpO2 99%  - BMI 29.92 kg/m??   Wt Readings from Last 12 Encounters:   09/24/21 74.2 kg (163 lb 9.3 oz)   09/12/21 64.5 kg (142 lb 4.8 oz)     Physical Exam  Constitutional:       General: She is not in acute distress.     Appearance: She is ill-appearing. She is not toxic-appearing or diaphoretic.      Comments: Chronically ill appearing     Pulmonary:      Effort: Pulmonary effort is normal. No respiratory distress.  Abdominal:      Tenderness: There is no abdominal tenderness.   Neurological:      General: No focal deficit present.      Mental Status: She is oriented to person, place, and time. Mental status is at baseline.   Psychiatric:         Mood and Affect: Mood normal.         Behavior: Behavior normal.         Thought Content: Thought content normal.             Glucose summary.   Glucose, POC (mg/dL)   Date Value   65/78/4696 60 (L)   09/24/2021 69 (L)   09/24/2021 64 (L)   09/24/2021 78   09/24/2021 80   09/23/2021 101   09/23/2021 122   09/23/2021 116        Summary of labs:  Lab Results   Component Value Date    A1C 7.0 (H) 09/12/2021     Lab Results   Component Value Date    CREATININE 0.62 09/24/2021     Lab Results   Component Value Date    WBC 7.2 09/24/2021    HGB 7.7 (L) 09/24/2021    HCT 24.1 (L) 09/24/2021    PLT 305 09/24/2021       Lab Results   Component Value Date    NA 143 09/24/2021    K 3.6 09/24/2021    CL 109 (H) 09/24/2021    CO2 27.0 09/24/2021    BUN 8 (L) 09/24/2021    CREATININE 0.62 09/24/2021    GLU 61 (L) 09/24/2021    CALCIUM 8.7 09/24/2021    MG 1.7 09/24/2021    PHOS 4.4 09/24/2021       Lab Results   Component Value Date    BILITOT 0.3 09/24/2021    BILIDIR 0.10 09/11/2021    PROT 5.2 (L) 09/24/2021    ALBUMIN 2.4 (L) 09/24/2021    ALT 10 09/24/2021    AST 15 09/24/2021    ALKPHOS 58 09/24/2021       Lab Results   Component Value Date    INR 1.14 09/21/2021    APTT 30.7 09/21/2021

## 2021-09-24 NOTE — Unmapped (Signed)
Pt closely monitored on CTSU throughout shift.  Neuro: Pt A/Ox4, follows commands, ambulates independently. Pain controlled w lidocaine patch above abd abcess, removed this afternoon per orders.  CV: HR NSR, BP maintained within ordered parameters, afebrile, no pitting edema.   Resp: SpO2 > 95% on RA, no cough or secretions.  GI: D10 infusion stopped this morning and Q2 blood glucose checks performed. BG rose instead of dropping below 50, so labs ordered were not obtained yet d/t not meeting parameters. Abcess dressing changed overnight, remains clean with minimal drainage from abcess. NPO w sips w meds.  GU: Pt voiding independently though infrequently, MD aware.  Pt repositioned Q2 hours. Please see flowsheets for further information.    Problem: Adult Inpatient Plan of Care  Goal: Plan of Care Review  Outcome: Ongoing - Unchanged  Goal: Patient-Specific Goal (Individualized)  Outcome: Ongoing - Unchanged  Goal: Absence of Hospital-Acquired Illness or Injury  Outcome: Ongoing - Unchanged  Intervention: Identify and Manage Fall Risk  Recent Flowsheet Documentation  Taken 09/23/2021 0800 by Gaye Pollack, RN  Safety Interventions:   aspiration precautions   bed alarm   bleeding precautions   lighting adjusted for tasks/safety   low bed  Intervention: Prevent Skin Injury  Recent Flowsheet Documentation  Taken 09/23/2021 0800 by Gaye Pollack, RN  Skin Protection: adhesive use limited  Intervention: Prevent and Manage VTE (Venous Thromboembolism) Risk  Recent Flowsheet Documentation  Taken 09/23/2021 1600 by Gaye Pollack, RN  Activity Management: activity adjusted per tolerance  Taken 09/23/2021 1400 by Gaye Pollack, RN  Activity Management: activity adjusted per tolerance  Taken 09/23/2021 1200 by Gaye Pollack, RN  Activity Management: activity adjusted per tolerance  Taken 09/23/2021 1000 by Gaye Pollack, RN  Activity Management: activity adjusted per tolerance  Taken 09/23/2021 0800 by Gaye Pollack, RN  Activity Management: activity adjusted per tolerance  VTE Prevention/Management:   anticoagulant therapy   bleeding precautions maintained   bleeding risk factors identified  Intervention: Prevent Infection  Recent Flowsheet Documentation  Taken 09/23/2021 0800 by Gaye Pollack, RN  Infection Prevention:   cohorting utilized   personal protective equipment utilized  Goal: Optimal Comfort and Wellbeing  Outcome: Ongoing - Unchanged  Goal: Readiness for Transition of Care  Outcome: Ongoing - Unchanged  Goal: Rounds/Family Conference  Outcome: Ongoing - Unchanged     Problem: Self-Care Deficit  Goal: Improved Ability to Complete Activities of Daily Living  Outcome: Ongoing - Unchanged     Problem: Skin Injury Risk Increased  Goal: Skin Health and Integrity  Outcome: Ongoing - Unchanged  Intervention: Optimize Skin Protection  Recent Flowsheet Documentation  Taken 09/23/2021 1600 by Gaye Pollack, RN  Head of Bed [facility]) Positioning: HOB at 30-45 degrees  Taken 09/23/2021 1400 by Gaye Pollack, RN  Head of Bed [facility]) Positioning: HOB at 30-45 degrees  Taken 09/23/2021 1200 by Gaye Pollack, RN  Head of Bed [facility]) Positioning: HOB at 30-45 degrees  Taken 09/23/2021 1000 by Gaye Pollack, RN  Head of Bed [facility]) Positioning: HOB at 30-45 degrees  Taken 09/23/2021 0800 by Gaye Pollack, RN  Pressure Reduction Techniques:   heels elevated off bed   positioned off wounds   pressure points protected  Head of Bed (HOB) Positioning: HOB at 30-45 degrees  Pressure Reduction Devices:   heel offloading device utilized   positioning supports utilized   pressure-redistributing mattress utilized  Skin Protection: adhesive use limited

## 2021-09-25 DIAGNOSIS — C569 Malignant neoplasm of unspecified ovary: Principal | ICD-10-CM

## 2021-09-25 LAB — COMPREHENSIVE METABOLIC PANEL
ALBUMIN: 2.1 g/dL — ABNORMAL LOW (ref 3.4–5.0)
ALKALINE PHOSPHATASE: 53 U/L (ref 46–116)
ALT (SGPT): 7 U/L — ABNORMAL LOW (ref 10–49)
ANION GAP: 8 mmol/L (ref 5–14)
AST (SGOT): 30 U/L (ref <=34)
BILIRUBIN TOTAL: 0.3 mg/dL (ref 0.3–1.2)
BLOOD UREA NITROGEN: 14 mg/dL (ref 9–23)
BUN / CREAT RATIO: 7
CALCIUM: 8.2 mg/dL — ABNORMAL LOW (ref 8.7–10.4)
CHLORIDE: 108 mmol/L — ABNORMAL HIGH (ref 98–107)
CO2: 26 mmol/L (ref 20.0–31.0)
CREATININE: 1.87 mg/dL — ABNORMAL HIGH
EGFR CKD-EPI (2021) FEMALE: 34 mL/min/{1.73_m2} — ABNORMAL LOW (ref >=60)
GLUCOSE RANDOM: 77 mg/dL (ref 70–179)
POTASSIUM: 3.2 mmol/L — ABNORMAL LOW (ref 3.4–4.8)
PROTEIN TOTAL: 4.5 g/dL — ABNORMAL LOW (ref 5.7–8.2)
SODIUM: 142 mmol/L (ref 135–145)

## 2021-09-25 LAB — SULFONYLUREA SCREEN, SERUM
CHLORPROPAMIDE,S: NEGATIVE
GLIMEPIRIDE,S: NEGATIVE
GLIPIZIDE,S: NEGATIVE
GLYBURIDE,S: NEGATIVE
NATEGLINIDE: NEGATIVE
PIOGLITAZONE: NEGATIVE
REPAGLINIDE,S: NEGATIVE
ROSIGLITAZONE: NEGATIVE
TOLAZAMIDE,S: NEGATIVE
TOLBUTAMIDE,S: NEGATIVE

## 2021-09-25 LAB — CBC W/ AUTO DIFF
BASOPHILS ABSOLUTE COUNT: 0 10*9/L (ref 0.0–0.1)
BASOPHILS RELATIVE PERCENT: 0.2 %
EOSINOPHILS ABSOLUTE COUNT: 0 10*9/L (ref 0.0–0.5)
EOSINOPHILS RELATIVE PERCENT: 0.4 %
HEMATOCRIT: 21.6 % — ABNORMAL LOW (ref 34.0–44.0)
HEMOGLOBIN: 7.2 g/dL — ABNORMAL LOW (ref 11.3–14.9)
LYMPHOCYTES ABSOLUTE COUNT: 0.8 10*9/L — ABNORMAL LOW (ref 1.1–3.6)
LYMPHOCYTES RELATIVE PERCENT: 13.4 %
MEAN CORPUSCULAR HEMOGLOBIN CONC: 33.4 g/dL (ref 32.0–36.0)
MEAN CORPUSCULAR HEMOGLOBIN: 27.8 pg (ref 25.9–32.4)
MEAN CORPUSCULAR VOLUME: 83.2 fL (ref 77.6–95.7)
MEAN PLATELET VOLUME: 7.1 fL (ref 6.8–10.7)
MONOCYTES ABSOLUTE COUNT: 0.6 10*9/L (ref 0.3–0.8)
MONOCYTES RELATIVE PERCENT: 10.3 %
NEUTROPHILS ABSOLUTE COUNT: 4.7 10*9/L (ref 1.8–7.8)
NEUTROPHILS RELATIVE PERCENT: 75.7 %
PLATELET COUNT: 239 10*9/L (ref 150–450)
RED BLOOD CELL COUNT: 2.6 10*12/L — ABNORMAL LOW (ref 3.95–5.13)
RED CELL DISTRIBUTION WIDTH: 17 % — ABNORMAL HIGH (ref 12.2–15.2)
WBC ADJUSTED: 6.3 10*9/L (ref 3.6–11.2)

## 2021-09-25 LAB — PROINSULIN: PROINSULIN: 1.3 pmol/L — ABNORMAL LOW

## 2021-09-25 LAB — PHOSPHORUS: PHOSPHORUS: 4.6 mg/dL (ref 2.4–5.1)

## 2021-09-25 LAB — MAGNESIUM: MAGNESIUM: 1.5 mg/dL — ABNORMAL LOW (ref 1.6–2.6)

## 2021-09-25 LAB — VANCOMYCIN, RANDOM: VANCOMYCIN RANDOM: 44.5 ug/mL

## 2021-09-25 MED ADMIN — metroNIDAZOLE (FLAGYL) IVPB 500 mg: 500 mg | INTRAVENOUS | @ 04:00:00 | Stop: 2021-09-28

## 2021-09-25 MED ADMIN — metroNIDAZOLE (FLAGYL) tablet 500 mg: 500 mg | ORAL | @ 18:00:00 | Stop: 2021-09-28

## 2021-09-25 MED ADMIN — dexAMETHasone (DECADRON) tablet 1 mg: 1 mg | ORAL | @ 14:00:00 | Stop: 2021-09-29

## 2021-09-25 MED ADMIN — potassium chloride 20 mEq in 100 mL IVPB Premix: 20 meq | INTRAVENOUS | @ 17:00:00 | Stop: 2021-09-25

## 2021-09-25 MED ADMIN — acetaminophen (TYLENOL) tablet 650 mg: 650 mg | ORAL | @ 05:00:00

## 2021-09-25 MED ADMIN — potassium chloride 20 mEq in 100 mL IVPB Premix: 20 meq | INTRAVENOUS | @ 18:00:00 | Stop: 2021-09-25

## 2021-09-25 MED ADMIN — enoxaparin (LOVENOX) syringe 40 mg: 40 mg | SUBCUTANEOUS | @ 14:00:00 | Stop: 2021-09-25

## 2021-09-25 MED ADMIN — pantoprazole (PROTONIX) EC tablet 40 mg: 40 mg | ORAL | @ 14:00:00

## 2021-09-25 MED ADMIN — vancomycin (VANCOCIN) IVPB 1000 mg (premix): 1000 mg | INTRAVENOUS | @ 05:00:00 | Stop: 2021-10-05

## 2021-09-25 MED ADMIN — sodium chloride 0.9% (NS) bolus 1,000 mL: 1000 mL | INTRAVENOUS | Stop: 2021-09-25

## 2021-09-25 MED ADMIN — metroNIDAZOLE (FLAGYL) IVPB 500 mg: 500 mg | INTRAVENOUS | @ 11:00:00 | Stop: 2021-09-25

## 2021-09-25 MED ADMIN — oxyCODONE (ROXICODONE) immediate release tablet 5 mg: 5 mg | ORAL | @ 05:00:00 | Stop: 2021-09-24

## 2021-09-25 MED ADMIN — dextrose 5 % and sodium chloride 0.45 % infusion: 100 mL/h | INTRAVENOUS | @ 05:00:00 | Stop: 2021-09-25

## 2021-09-25 MED ADMIN — ondansetron (ZOFRAN) injection 4 mg: 4 mg | INTRAVENOUS | @ 11:00:00 | Stop: 2021-09-25

## 2021-09-25 MED ADMIN — magnesium sulfate 2gm/50mL IVPB: 2 g | INTRAVENOUS | @ 17:00:00 | Stop: 2021-09-25

## 2021-09-25 MED ADMIN — simethicone (MYLICON) chewable tablet 80 mg: 80 mg | ORAL | @ 17:00:00

## 2021-09-25 MED ADMIN — levoFLOXacin (LEVAQUIN) 750 mg/150 mL IVPB 750 mg: 750 mg | INTRAVENOUS | @ 14:00:00 | Stop: 2021-09-25

## 2021-09-25 MED ADMIN — docusate sodium (COLACE) capsule 100 mg: 100 mg | ORAL | @ 14:00:00

## 2021-09-25 MED ADMIN — dextrose 10 % and sodium chloride 0.45 % infusion: 75 mL/h | INTRAVENOUS | @ 14:00:00

## 2021-09-25 MED ADMIN — senna (SENOKOT) tablet 2 tablet: 2 | ORAL | @ 14:00:00

## 2021-09-25 MED ADMIN — lidocaine (LIDODERM) 5 % patch 1 patch: 1 | TRANSDERMAL | @ 14:00:00

## 2021-09-25 MED ADMIN — metoclopramide (REGLAN) tablet 10 mg: 10 mg | ORAL | @ 14:00:00

## 2021-09-25 NOTE — Unmapped (Signed)
Endocrine Team Diabetes Follow Up Consult Note     Consult information:  Requesting Attending Physician : [name], *  Service Requesting Consult : Med [facility])  Primary Care Provider: Carver Fila, MD  Impression:  Colleen Russo is a 46 y.o. female admitted for Hypoglycemia. We have been consulted at the request of [name], * to evaluate Colleen Russo for hyperglycemia.       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   2. Nutrition: Complicating glycemic control. Increasing risk for both hypoglycemia and hyperglycemia.  3. Ovarian cancer . Complicating glycemic control and increasing risk for severe hyperglycemia and severe hypoglycemia      Studies reviewed 09/25/21:  Labs/Imaging Interpretation:   POCT glucose: sugars > 70 without hyperglycemia while on dextrose-containing fluid (most recently D10)  -2/21 C peptide not elevated but insulin level continued to be elevated  Notes reviewed: Primary team and nursing notes      Overall impression based on above reviews and history:  -she denies anyone in the household with insulin-dependent DM; denies having used [place] in the past   -no recurrent hypoglycemia today 09/25/21 while on dextrose as well as having received 1 mg dexamethasone   -2/21 C peptide low with elevated insulin points to exogenous insulin (either administered recently or still lingering from pt's last administration prior to admission)   -regarding Levaquin's insulin-dependent hypoglycemia adverse effects: would expect this to still have an elevated C peptide along with elevated insulin levels as Levaquin's effects on the beta cell are similar to that of sulfonylureas in prolong insulin release (but not delaying insulin clearance)     Recommendations:  -if recurrent sugars less than 60: collect serum glucose, Beta-hydroxybutyrate, insulin, and C peptide levels   - Monitor glucose per unit protocols   - We can see patient once TPN is initiated to get a sense of her corrective needs and calculate a carb ratio to put in the bag to remove need for self-administration while at home m  -defer steroids to Gyn Onc: if tapering, would taper more slowly    -1 mg x 5 days   -then0.5 mg x 7 days    -then discontinue       Thank you for this consult. Discussed plan with primary team. We will continue to follow and make recommendations and place orders as appropriate.    Please page with questions or concerns: Endocrine fellow on call: [phone]    Vida Rigger. Claudell Kyle, MD  [facility] Endocrine Fellow, PGY-4  09/25/21      Subjective:  Interval hx: Today 09/25/21, pt resting in bed. Remains on dextrose fluids with no lows today.      Initial HPI:  Colleen Russo is a 46 y.o. 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 We have been consulted at the request of [name], * to evaluate Colleen Russo for hyperglycemia.     Patient was recently discharged from the hospital after spending about 4 weeks for SBO, EC fistula with abscess and nausea/vomiting. During this hospitalization, she developed TPN induced hyperglycemia, which was managed with correctional insulin and NPH. She was discharged in a stable state with TPN and recommendations of NPH 20 units at the start of the TPN. Per documentation, the TPN had no insulin in the bag but did have octreotide. Another thing to mentioned is that patient had  been taking Dexamethasone prior to admission and while admitted she was taking up to 8 mg of dexamethasone and was discharge on taper of 2 mg for 3 days, followed by 1 mg for 7 days.     After discharge patient had been having low blood sugar at the end of her TPN. This similar pattern was happening during hospitalization but her glucose decreased to normal range and never had hypoglycemia. Patient started reporting values < 70 mg/dL on 4/69, again at around 9-10 am after he TPN stopped. This episodes where accompanied with some lethargy which got better with candy. Interestly, her blood sugar would be < 70 and with smal piece of candy would jump into the 300s. Patient continued to have episodes an her NPH was discontinued ( after being reduced in half on 2/15) on 2/16. On 6:19 PM patient took 4 units for a BG 295 mg/dL and her blood sugar drop to 159 mg/dL, and that is the last time she had any form of insulin.     Per documentation, octreotide was being use to control fistula output and help with nausea and vomiting.     She received aggressive measures overnight 09/21/2021 including stress dose steroids with stabilization of blood glucoses. We recommended a modified fast by discontinuation of dextrose infusion. Stopped at 1113 on 09/21/2021 and at aproximately 1700 patient had POC of 50 with serum of <55. Labs where collected prior to dextrose admin. Patient had glucagon admin with monitor of her BG 15 and 30 min after. She was found to have approprietly low C peptide, high insulin levels,  low normal ketones, >30 points increase after glucagon admin.     Diabetes History:  Patient has a history of TPN induced hyperglycemia diagnosed 09/12/2021.  Diabetes is managed by: primary  Current home diabetes regimen: was taking 20 units of NPH at the start of her TPN but recently stopped due to recurrent hypoglycemia.   Current home blood glucose monitoring:  Glucometer   Hypoglycemia awareness: yes   Complications related to diabetes: none known    Current Diabetes Inpatient Regimen:  None     Current Nutrition:  Active Orders   Diet    NPO Sips with meds; Medically necessary         ROS: As per HPI.    ??? dexAMETHasone  1 mg Oral Daily    Followed by   ??? [START ON 09/29/2021] dexAMETHasone  0.5 mg Oral Daily   ??? docusate sodium  100 mg Oral Daily   ??? enoxaparin (LOVENOX) injection  40 mg Subcutaneous Q24H   ??? fentaNYL  1 patch Transdermal Q72H   ??? levoFLOXacin 750 mg Intravenous Q24H   ??? lidocaine  1 patch Transdermal Daily   ??? metroNIDAZOLE  500 mg Intravenous [facility]   ??? OLANZapine  2.5 mg Oral Daily   ??? OLANZapine  5 mg Oral Nightly   ??? pantoprazole  40 mg Oral Daily   ??? polyethylene glycol  17 g Oral Daily   ??? scopolamine  1 patch Topical Q72H   ??? vancomycin  1,000 mg Intravenous Q12H       Current Outpatient Medications   Medication Instructions   ??? acetaminophen (TYLENOL) 650 mg, Oral, Every 6 hours PRN   ??? bisacodyL (DULCOLAX) 10 mg, Rectal, Daily PRN   ??? calcium carbonate (TUMS) 200 mg calcium (500 mg) chewable tablet 200 mg of elem calcium, Oral, 3 times a day PRN   ??? cycloPHOSphamide (CYTOXAN) 50 mg,  Oral, Daily (standard), Do not start until discussing with your oncologist   ??? docusate sodium (COLACE) 100 mg, Oral, Daily (standard)   ??? famotidine (PEPCID) 20 mg, Oral, Daily (standard)   ??? fentaNYL (DURAGESIC) 50 mcg/hr patch 1 patch, Transdermal, Every 72 hours   ??? GAS RELIEF 80 (SIMETHICONE) 80 mg, Oral, Every 6 hours PRN   ??? glucose 16 g, Oral, Every 10 min PRN   ??? hydrOXYzine (ATARAX) 25 mg, Oral, Every 6 hours PRN   ??? insulin lispro (HUMALOG) 100 unit/mL injection pen Inject 0-0.2 mL (0-20 Units total) under the skin every six (6) hours as needed. Administer number of insulin units based on your blood glucose: BG 160-200 = 1 unit BG 201-240 = 4 units BG 241-280 = 6 units BG 281-300 = 8 units BG > 300 = 10 units and notify provider   ??? insulin NPH (HUMULIN N NPH U-100 INSULIN) 100 unit/mL injection Inject 20 Units under the skin daily. Inject 1 hour prior to starting TPN infusion   ??? lidocaine 4 % patch Place 1 patch on the skin daily. Apply to affected area for 12 hours only each day (then remove patch).   ??? metoclopramide (REGLAN) 10 mg, Oral, Every 8 hours   ??? OLANZapine (ZYPREXA) 5 mg, Oral, Nightly   ??? OLANZapine (ZYPREXA) 2.5 mg, Oral, Daily (standard)   ??? omeprazole (PRILOSEC) 40 mg, Oral, Daily (standard)   ??? polyethylene glycol (GLYCOLAX) 17 g, Oral, Daily (standard)   ??? prochlorperazine (COMPAZINE) 10 mg, Oral, Every 8 hours PRN   ??? scopolamine (TRANSDERM-SCOP) 1 mg over 3 days 1 patch, Transdermal, Every 72 hours   ??? senna (SENOKOT) 8.6 mg tablet 2 tablets, Oral, 2 times a day PRN   ??? sodium chloride (NS) 0.9 % injection Use 5 mL by Intra-cannular route daily.           No past medical history on file.    Past Surgical History:   Procedure Laterality Date   ??? IR INSERT G-TUBE PERCUTANEOUS  09/06/2021    IR INSERT G-TUBE PERCUTANEOUS 09/06/2021 Colleen Fudge, MD IMG VIR [facility]       No family history on file.    Social History     Tobacco Use   ??? Smoking status: Never   ??? Smokeless tobacco: Never       OBJECTIVE:  BP 111/80  - Pulse 97  - Temp 37 ??C (98.6 ??F) (Oral)  - Resp 12  - Ht 157.5 cm (5' 2)  - Wt 74.6 kg (164 lb 7.4 oz)  - SpO2 99%  - BMI 30.08 kg/m??   Wt Readings from Last 12 Encounters:   09/25/21 74.6 kg (164 lb 7.4 oz)   09/12/21 64.5 kg (142 lb 4.8 oz)     General: Chronically ill-appearing. No acute distress. Very pleasant. Body mass index is 30.08 kg/m??.   HEENT: Normocephalic, atraumatic. EOMI, sclera anicteric.  Neck: No stridor. Left neck with gastric access.   Respiratory: No conversational dyspnea.   Abdominal: Non-distended.    MSK: Normal station and gait; no edema.   Neuro: Awake, alert, and oriented

## 2021-09-25 NOTE — Unmapped (Signed)
Vancomycin Therapeutic Monitoring Pharmacy Note    Colleen Russo is a 46 y.o. female continuing vancomycin. Date of therapy initiation: 09/22/21    Indication: Bacteremia/Sepsis    Prior Dosing Information: Current regimen vancomcyin 1000 mg IV q12 hrs      Goals:  Therapeutic Drug Levels  Vancomycin trough goal: 10-15 mg/L    Additional Clinical Monitoring/Outcomes  Renal function, volume status (intake and output)    Results: Vancomycin level 44.5 mg/L, drawn appropriately    Wt Readings from Last 1 Encounters:   09/25/21 74.6 kg (164 lb 7.4 oz)     Creatinine   Date Value Ref Range Status   09/25/2021 1.87 (H) 0.60 - 0.80 mg/dL Final   16/05/9603 5.40 0.60 - 0.80 mg/dL Final   98/06/9146 8.29 (L) 0.60 - 0.80 mg/dL Final        Pharmacokinetic Considerations and Significant Drug Interactions:  ??? Adult (calculated on 09/24/21): Vd = 47.6 L, ke = 0.078 hr-1  ??? Concurrent nephrotoxic meds: not applicable    Assessment/Plan:  Recommendation(s)  ??? Hold further vancomycin dosing until level < 15 mg/L.  ??? Estimated trough on recommended regimen: Not applicable - dosing by level    Follow-up  ??? Level due: tomorrow with AM labs  ??? A pharmacist will continue to monitor and order levels as appropriate    Please page service pharmacist with questions/clarifications.    Rubie Maid, PharmD

## 2021-09-25 NOTE — Unmapped (Signed)
Pt is on multiple abx and fluids. MD talking about pulling PICC in am. Will wait until MD rounds in am, to see if PICC is going to be replaced, or needs to be pulled.

## 2021-09-25 NOTE — Unmapped (Addendum)
Division of Infectious Diseases  General Inpatient Consultation Service     For any questions about this consult, page [phone] (Gen C Follow-up Pager).      Colleen Russo is being seen in consultation at the request of [name], MD for evaluation and management of bacteremia and abd wall abscess/enterocutaneous fistula.       PLAN FOR 09/25/2021    Diagnostic  ??? Follow pending blood cultures and peritoneal cultures    ??? Work up for AKI per primary team  ??? Monitor for antimicrobial toxicity with the following:  ??? Daily CBCs w dif  for now (given leukopenia), bmps at least multiple times per week    Treatment  ??? HOLD vancomycin (dosed by pharmacy for goal trough 10-15) but please continue trending levels  ??? STOP levofloxacin  ??? Continue flagyl given persistent labile BS  ??? START Cefepime 1g Q12, this may need further dose adjustments pending CrCl, please renally adjust  ??? Remove PICC promptly (though clearly needs alternate access first given hypoglycemia)  ??? Duration of therapy = tbd  o start date = 2/19  o end date = tbd    I discussed the plans for today with patient on 09/25/2021.    Our service will continue to follow.    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     September 24, 2021   Patient has: [x]  acute illness w/systemic sxs  [mod] [x]  illness posing risk to life or function [high]   I reviewed:   (3+) [x]  primary team note [x]  consultant note(s) []  procedure/op note(s) [x]  micro result(s)    [x]  CBC results [x]  chemistry results []  radiology report(s) []  w/indep. historian   I independently visualized:   (any)   []  cxs/plates in lab []  plain film images []  CT images []  PET images    []  path slide(s) []  ECG tracing []  MRI images []  nuclear scan   I discussed: (any) [x]  micro and/or path w/lab personnel []  drug options and/or interactions w/ID pharmD    []  procedure/OR findings w/other MD(s) []  echo and/or imaging w/other MD(s)    []  mgm't w/attending(s) involved in case []  setting up home abx w/OPAT team   Mgm't requires: []  prescription drug(s)  [mod] [x]  intensive toxicity monitoring  [high]   Minutes: []  25-34      [x]  35-49      []  50+ []  30-74 providing critical care on the unit     Interval notes & result reports show:  Remains afebrile. Underwent paracentesis 09/24/21 w/ reported straw colored fluid without issue. Continues D5 drip.     Patient reports: no new complaints today. States the rash on her arm has been stable. Stable appearance of lower extremity 'rash' as well. No new diarrhea, nausea or emesis. No subjective fever.    My interpretation of the data I reviewed is: WBC  normal and stable. Cr 1.87 and increased from 0.62 previously. Hypokalemia @ 3.2, lower from 3.6 previously. Peritoneal studies with SAAG <1.1 and protein >2.5 suggestive of nonportal HTN pathology that could be c/w infection or cancer.     # Polymicrobial Bacteremia: Bacillus cereus, Staph hominis, Staph epi bacteremia   - acute, poses threat to life or bodily function  [high]  Staph hominis now in 3 separate peripheral blood cultures and 1 blood culture via PICC collected across ~13h on 2/18 and 2/19, clearly a true infection. B. cereus in 2/2 peripheral cultures from 2/18, not growing (yet) on subsequent. Staph epi not in initial cultures but in 2 of 2 (peripheral, PICC) on 2/19, so these also likely represent true bacteremia. Both can be found in the GI tract and also (esp.the Staph) on skin and wounds, so an initial source of the enterocutaneous fistula or elsewhere in the GI tract is most likely, but suspect PICC is now infected as well. We generally treat true bacteremia of each of these pathogens with at least 7 days of IV antibiotics, sometimes up to 14 days. If she were to remain bacteremic on subsequent cultures I would recommend a TTE to evaluate for endocarditis. PICC should be removed promptly.   ??? Follow repeat blood cultures  to evaluate for clearance. Planned duration will be at least 7 days from first negative culture  ??? Remove PICC promptly (though clearly needs alternate access first given hypoglycemia)    # Enterocutaneous fistula, abdominal wall abscess, possible peritonitis   - acute, poses threat to life or bodily function  [high]  CT and drain output suggest abscess has resolved or nearly resolved. Scant staining on gauze could represent residual abscess fluid or residual fistula, but it is down to drops. New finding of peritoneal changes worrisome for either infection or malignancy; no rebound on exam to suggest peritonitis, however. Perhaps most concerningly, has poor prognosis for bowel obstruction and for healing her enterocutaneous fistula, per gyn-onc, so will likely remain at high risk for persistent or new intraabdominal infections.   - Peritoneal studies revealed monocytic predominance of cells (351 clumped nucleated cells, 2% neut) w/ SAAG 0.8 and protein 2.8 which can be consistent with underlying malignancy  -Cytology pending    # Leukopenia   - acute, undx'd new problem w/uncertain prognosis  [mod]  ??? 2.9 on 2/20, down from baseline of 6-7, resolved the next day.  Continue to trend CBCs- would do with dif daily for now, given rash.       # AKI, 09/25/21  Noted 09/25/21, 0.62->1.87.   -Recommend workup per primary team, consider UA    # forearm rash   - acute, undx'd new problem w/uncertain prognosis  [mod]  ??? Fairly small area, not typical drug rash appearance, improved on 2/21. Monitor.    # Management of prescription antimicrobials needing intensive toxicity monitoring  Metronidazole can cause rashes, dysgeusia (altered taste), peripheral neuropathy, and/or myelosuppression.  Quinolones can cause rashes, N/V/D, CNS effects, MSK effects, arrhythmias and/or QTc prolongation, glucose abnormalities, and/or neuropathies.  Vancomycin can cause back pain, various dermatological effects, thrombocytopenia, neutropenia, and/or nephrotoxicity.   ??? See recommendations in blue box above.    # Disposition   -Likely home once improved. Likely OPAT candidate if prolonged IV antibiotics were to be required.           Antimicrobials & Other Medications   Vanc IV 2/18-  Levofloxacin 750 IV q 24 2/8 -  Flagyl 500 PO q  2/8-    Immunomodulators   Dexamethasone taper 2/10-2/17, hydrocortisone/dexamethasone 2/18-present  - last chemo ~9 months ago    Current Medications as of 09/25/2021  Scheduled  PRN   dexAMETHasone, 1 mg, Daily   Followed by  Melene Muller ON 09/29/2021] dexAMETHasone, 0.5 mg, Daily  docusate sodium, 100 mg, Daily  enoxaparin (LOVENOX) injection, 40 mg, Q24H  fentaNYL, 1 patch, Q72H  levoFLOXacin, 750 mg, Q24H  lidocaine, 1 patch, Daily  metroNIDAZOLE, 500 mg, Q8H SCH  OLANZapine, 2.5 mg, Daily  OLANZapine, 5 mg, Nightly  pantoprazole, 40 mg, Daily  polyethylene glycol, 17 g, Daily  scopolamine, 1 patch, Q72H  vancomycin, 1,000 mg, Q12H      acetaminophen, 650 mg, Q6H PRN  bacitracin-polymyxin b, , Code/Trauma/Sedation Med  calcium carbonate, 200 mg of elem calcium, TID PRN  metoclopramide, 10 mg, TID PRN  naloxone, 0.1 mg, Q5 Min PRN  senna, 2 tablet, BID PRN  simethicone, 80 mg, Q6H PRN           Physical Exam     Temp:  [36.9 ??C (98.4 ??F)-37.2 ??C (99 ??F)] 36.9 ??C (98.5 ??F)  Heart Rate:  [90-107] 107  SpO2 Pulse:  [95-109] 106  Resp:  [12-18] 18  BP: (87-117)/(62-85) 117/85  MAP (mmHg):  [68-93] 93  SpO2:  [98 %-100 %] 99 %    Actual body weight: 74.6 kg (164 lb 7.4 oz)  Ideal body weight: 50.1 kg (110 lb 7.2 oz)  Adjusted ideal body weight: 59.9 kg (132 lb 0.9 oz)     CONST: Vital signs above. Non-toxic appearance, no acute distress  EYES: anicteric sclerae. EOMI.   ENMT: Normal appearance of external nose and ears. Dry MM.   NECK: supple, feeding tube drain entering through L neck without surrounding erythema though there is purulent visible drainage, trachea midline.  CV: RRR, no m/r/g. No LE edema bilaterally.   PULM: CTAB, no wheezes or rales  ABD: soft,  ND, +BS, very mild tenderness to palpation in all 4 quadrants without rebound or guarding. Bandaged. Scarred skin and JP drain exiting mid abdomen with completely clear fluid in bulb (pt reports this is from the flush).   GU: no urinary catheter in place  SKIN: 1-2 dozen pinpoint, pinkish, nonblanchable macules on R forearm, slightly faded. No rashes noted elsewhere on clothed exam. Skin warm and dry.   NEURO: CN II-XII grossly inact. moving all 4 extremities  PSYCH: normal mood and affect. Fully alert and oriented.     Patient Lines/Drains/Airways Status     Active Active Lines, Drains, & Airways     Name Placement date Placement time Site Days    Closed/Suction Drain Left LUQ Bulb 10 Fr. 09/10/21  1104  LUQ  14    Open Drain 1 Anterior;Left Neck 09/06/21  --  Neck  19    PICC Triple Lumen 08/21/21 Left Basilic 08/21/21  1332  Basilic  34                Data for ID Decision Making  ( IDGENCONMDM )       Micro & Serological Data   ( RSLTMICRO  /  Dorice Lamas  /  00CXSRC  /  00CXRES  /  00CXSUSC )  2/21 bld cx x2: pending  2/19 bld cx x2: Staph hominis in 2/2 AND Staph epi in 2/2 (Mec A detected)  2/18 bld cx: Bacillus cereus (2/2), Staph hominis (2/2)  2/17 ucx neg  2/7 abd aspirate: gram: 2+ GPR.  Cx: 4+ mixed GP/GN orgs, incl. 3+ Citrobacter freundii (R unasyn, aztreo, cefazolin, ceftaz, ctx, I zosyn)), 2+ PsA (pan suscep), 4+ Strep anginosus  1/28 HIV neg  Recent Studies  ( RISRSLT )  2/19 CT abd pelvis w/ IV ZOX:[id number]:  ??  1. Persistent dilatation of small bowel loops with several transition points compatible with complex small bowel obstruction with likely closed loop component. There is slight interval increase in enhancement of the affected bowel mucosa. Recommend correlation with lactate and/or surgical consultation for ischemia.  ??  2. Scattered foci of gas in the anterior peritoneum. These do not definitively technique with the enterocutaneous fistula and may represent focal walled off perforation.  ??  3. New enhancement of peritoneal ascites concerning for peritonitis.  ??  4. Decreased size of gas and fluid containing collection in the left anterior abdominal wall arising from the known enterocutaneous fistula status post drain placement.  ADDENDUM:   (09/22/2021 8:26 PM)  On review, the following additional findings were noted:  ??  Limited exam in the absence of oral contrast.   ??  Redemonstration of a complex enterocutaneous fistula with associated soft tissue stranding at the level of the anterior midline abdomen. There has been interval placement of a pigtail drainage catheter within the previously-described air and fluid collection along the left lower anterior abdominal wall, which is significantly decreased in size when compared to prior exam.  ??  Numerous persistently dilated loops of small bowel are seen throughout the abdomen and pelvis, which demonstrate air-fluid levels and measure up to 3.7 cm in diameter. There is similar tethering of the small bowel along the anterior peritoneum with abrupt collapse at this level, thought likely to reflect a transition point (2:72). The distal small bowel appears mostly decompressed to the level of the cecum. These findings remain worrisome for small bowel obstruction.   ??  Low attenuation abdominopelvic ascites (~18 HU) and diffuse peritoneal thickening/enhancement is similar to prior exam. While these findings, could potentially be seen in the setting of peritonitis, could also be seen with diffuse peritoneal disease spread and associated malignant ascites. Recommend clinical correlation.  ??  Elsewhere throughout the abdomen multiple loops of small demonstrate short segments of wall thickening, hyperenhancement, and luminal narrowing (2:63; 2:94), which may reflect sites of serosal involvement and associated stricturing        Initial Consult Documentation from September 23, 2021     Sources of information include: chart review, patient and treating providers.    History of Present Illness:     Ms. Koltz was admitted to [facility] 2/17 with persistent hypoglycemia to 30s despite holding long acting insulin. This started a few days  after discharge home 2/10 from her last admission. Reports she experienced periods of lightheadness, decreased peripheral vision and is told she had slurred speech during these episodes, during which her glucose was found to be low. She would also often feel hot or chilled when her sugar was low, not oat other times. No rigors.     Reports full adherence to levo/flagyl she was taking  for abdominal abscess s/p VIR drainage 2/7. She reports that either as soon as or within a day or two after going home the output into her abdominal/fistula site JP drain stopped, with no drainage (other than the fluid she flushes in) since. She has had very scant greenish drainage from the fistula/abscess exit site area- enough to stain overlying dressing but not enough to soak through or really be visible on  the skin. Reports her abdominal pain is unchanged.     Blood cultures were drawn, which grew Bacillus and a coag negative staph in both peripheral blood cultures. She has remained afebrile without infectious symptoms. After initial admission to the MICU, transferred to the floor.     When blood cultures grew, vancomycin was added.     Today, she noticed a R forearm rash with a few dozen small, pinpoint red macules. It is not itchy or painful. No rash or lesions she has noticed elsewhere.     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  -CT 12/2020 stable disease  - 01/23/21:??Diagnostic laparoscopy, exploratory laparotomy, unavoidable enterotomy with repair  -02/2021: noted to have EC fistuala,??OR on 7/2 for incision, exploration, and drainage of subcutaneous abscess??(6x8 cm) lateral to recent ex-lap incision. No definitive succus seen intra-op.??Treated conservatively with bowel rest and TPN for 1 month then had several I&D of recollected abscess at site of EC fistula with drain placed and left until clinical and imaging evidence of resolution????  -12/29-08/07/21 admission for partial SBO and abdominal wall fluid collection, biopsy of fluid collection consistent at first for upper GI or pancreatobiliary primary??  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 09/2021  -IDDM2  -poor nutrition  -anemia of chronic dz  -chronic pain on fentanyl patches    Meds and Allergies  Patient has a current medication list which includes the following prescription(s): acetaminophen, bisacodyl, calcium carbonate, cyclophosphamide, docusate sodium, famotidine, fentanyl, glucose, hydroxyzine, insulin lispro, insulin nph, lidocaine, metoclopramide, olanzapine, olanzapine, omeprazole, polyethylene glycol, prochlorperazine, scopolamine, senna, simethicone, sodium chloride, and [DISCONTINUED] fentanyl, and the following Facility-Administered Medications: acetaminophen, bacitracin-polymyxin b, calcium carbonate, dexamethasone **FOLLOWED BY** [START ON 09/29/2021] dexamethasone, dextrose 10 % and sodium chloride 0.45 %, docusate sodium, enoxaparin, fentanyl, levofloxacin, lidocaine, metoclopramide, metronidazole, naloxone, olanzapine, olanzapine, pantoprazole, polyethylene glycol, scopolamine, senna, simethicone, vancomycin **AND** Inpatient consult to Pharmacy RX to dose: vancomycin.    Allergies: Carboplatin and Ketamine       Social History  Patient  reports that she has never smoked. She has never used smokeless tobacco. Lives at home.

## 2021-09-25 NOTE — Unmapped (Signed)
[facility]   POST-PROCEDURE NOTE     Patient: Colleen Russo  DOB: 30-Oct-1975  Medical Record Number: [id number] Note Date / Time: September 24, 2021 4:11 PM     Procedure     Procedure: Paracentesis    Post Procedure Diagnosis:  Small volume ascites    Attending: Terrilyn Saver     Time Out:  Prior to the procedure, a time out was performed with all necessary team members present. During the time out, the patient, procedure and procedure site (when applicable) were verbally verified by the team members and Dr. Terrilyn Saver    Fellow/Resident: none    Complications: none    Access: RUQ    Closure: none    EBL: none    Samples:  For culture and fluid studies    BRIEF DESCRIPTION:  Small volume ascites  Sucessful removal of 150 ml of straw colored fluid    PLAN: per primary    Full report to follow.      Claretta Fraise, MD     September 24, 2021 4:11 PM

## 2021-09-25 NOTE — Unmapped (Signed)
PICC LINE TRIAGE NOTE    The Venous Access Team has received an order for PICC placement.  This patient has been triaged and  VAT will attempt bedside placement as schedule permits .      Thank You,    Axton Cihlar RN Venous Access Team [phone]      Workup Time:  45 minutes

## 2021-09-25 NOTE — Unmapped (Signed)
Patient alert oriented x4, denies any pain or discomfort, vital signs stable. The JP drain no output, and biliary drain intact with minimal output this shift. Patient left the floor for VIR today for paracentesis. Blood glucose still on the lower side D5.45 Fluid started. Patient up and ambulated in the hallway with good tolerance. Will continue monitoring.  Problem: Adult Inpatient Plan of Care  Goal: Plan of Care Review  Outcome: Progressing  Goal: Patient-Specific Goal (Individualized)  Outcome: Progressing  Goal: Absence of Hospital-Acquired Illness or Injury  Outcome: Progressing  Intervention: Identify and Manage Fall Risk  Recent Flowsheet Documentation  Taken 09/24/2021 0800 by Samantha Crimes, RN  Safety Interventions:   bleeding precautions   low bed   nonskid shoes/slippers when out of bed  Intervention: Prevent Skin Injury  Recent Flowsheet Documentation  Taken 09/24/2021 0800 by Samantha Crimes, RN  Skin Protection: adhesive use limited  Intervention: Prevent and Manage VTE (Venous Thromboembolism) Risk  Recent Flowsheet Documentation  Taken 09/24/2021 1200 by Samantha Crimes, RN  Activity Management: ambulated outside room  VTE Prevention/Management: ambulation promoted  Taken 09/24/2021 0800 by Samantha Crimes, RN  Activity Management:   activity adjusted per tolerance   activity encouraged  VTE Prevention/Management:   ambulation promoted   anticoagulant therapy  Intervention: Prevent Infection  Recent Flowsheet Documentation  Taken 09/24/2021 0800 by Samantha Crimes, RN  Infection Prevention: hand hygiene promoted  Goal: Optimal Comfort and Wellbeing  Outcome: Progressing  Goal: Readiness for Transition of Care  Outcome: Progressing  Goal: Rounds/Family Conference  Outcome: Progressing     Problem: Adult Inpatient Plan of Care  Goal: Patient-Specific Goal (Individualized)  Outcome: Progressing     Problem: Adult Inpatient Plan of Care  Goal: Absence of Hospital-Acquired Illness or Injury  Outcome: Progressing  Intervention: Identify and Manage Fall Risk  Recent Flowsheet Documentation  Taken 09/24/2021 0800 by Samantha Crimes, RN  Safety Interventions:   bleeding precautions   low bed   nonskid shoes/slippers when out of bed  Intervention: Prevent Skin Injury  Recent Flowsheet Documentation  Taken 09/24/2021 0800 by Samantha Crimes, RN  Skin Protection: adhesive use limited  Intervention: Prevent and Manage VTE (Venous Thromboembolism) Risk  Recent Flowsheet Documentation  Taken 09/24/2021 1200 by Samantha Crimes, RN  Activity Management: ambulated outside room  VTE Prevention/Management: ambulation promoted  Taken 09/24/2021 0800 by Samantha Crimes, RN  Activity Management:   activity adjusted per tolerance   activity encouraged  VTE Prevention/Management:   ambulation promoted   anticoagulant therapy  Intervention: Prevent Infection  Recent Flowsheet Documentation  Taken 09/24/2021 0800 by Samantha Crimes, RN  Infection Prevention: hand hygiene promoted     Problem: Adult Inpatient Plan of Care  Goal: Optimal Comfort and Wellbeing  Outcome: Progressing     Problem: Adult Inpatient Plan of Care  Goal: Readiness for Transition of Care  Outcome: Progressing     Problem: Self-Care Deficit  Goal: Improved Ability to Complete Activities of Daily Living  Outcome: Progressing     Problem: Skin Injury Risk Increased  Goal: Skin Health and Integrity  Outcome: Progressing  Intervention: Optimize Skin Protection  Recent Flowsheet Documentation  Taken 09/24/2021 1630 by Samantha Crimes, RN  Pressure Reduction Techniques: frequent weight shift encouraged  Taken 09/24/2021 0800 by Samantha Crimes, RN  Pressure Reduction Techniques: frequent weight shift encouraged  Pressure Reduction Devices: pressure-redistributing mattress utilized  Skin Protection: adhesive use limited     Problem:  Heart Failure Comorbidity  Goal: Maintenance of Heart Failure Symptom Control  Outcome: Progressing     Problem: Pain Chronic (Persistent) (Comorbidity Management)  Goal: Acceptable Pain Control and Functional Ability  Outcome: Progressing

## 2021-09-25 NOTE — Unmapped (Signed)
GYN ONC PROGRESS NOTE    Assessment and Plan:  Colleen Russo is a 46 y.o. 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 Patient discontinued from dextrose infusion. 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   Interval imaging with decreased previous fluid collection at [facility] site, new bowel edema c/f possible ischemia and increased pelvic fluid, s/p paracentesis 2/21 for ascites. Continues to work on glycemic optimization, abdominal exam remains benign with no concern for ischemia.    Onc: Recurrent Clear Cell Carcinoma of the Ovary   - Primary Oncologist: [name]  - s/p XL??with??radical tumor debulking including??total hysterectomy,??bilateral salpingo-oophorectomy, infra-gastric??omentectomy, and??washings??on 07/21/2019  -??s/p 6 cycles carbo/taxol, completed 12/05/19  -CT 2/022 concerning for progression, started on carbo/gem 2/22-5/24/22.  -CT 12/2020 stable disease  - 01/23/21:??Diagnostic laparoscopy, exploratory laparotomy, unavoidable enterotomy with repair  -02/2021: noted to have EC fistuala,??OR on 7/2 for incision, exploration, and drainage of subcutaneous abscess??(6x8 cm) lateral to recent ex-lap incision. No definitive succus seen intra-op.??Treated conservatively with bowel rest and TPN for 1 month then had several I&D of recollected abscess at site of EC fistula with drain placed and left until clinical and imaging evidence of resolution????  -12/29-08/07/21 admission for partial SBO and abdominal wall fluid collection, biopsy of fluid collection consistent at first for upper GI or pancreatobiliary primary??  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  - Last CA-125:??180 on 09/06/2021    Refractory Hypoglycemia - H/o Type 2 Diabetes - Discharged 2/9 on NPH 20 units daily to take 1 hr prior to TPN > attempted to decrease as an outpatient and ultimately discontinued given symptomatic lows, last took subq insulin 2/16   - Per Endocrine, unclear etiology  - Management per Primary Team, Endocrine following, appreciate recommendations   - D10 infusion d/c'd 2/20>> restarted D5 2/21, currently on Dexamethasone   - Patient previously on steroids for nausea symptoms, no longer needed, okay to  discontinue as appropriate per primary team   - Last hypoglycemic 52 2/21     Enterocutaneous Fistula - Hx abscesses - Bacteremia, new abdominal ascites  - History of enterocutaneous fistula,??and abscesses requiring drainage.  - Most recently, abscess drain placement by VIR 2/7, initially on Zosyn > s/p Levofloxacin and Flagyl   - BCx positive   - 2/19 Staph epidermidis, hominis, MRSA, Bacillus cereus   - ID consulted likely component of PICC colonization and intraabdominal source   - 2/21 Repeat BCx NG @ 24   - Currently on IV Vanc, Levo/Flagyl  - DDx includes EC fistula, superficial abscess, recurrent intraabdominal abscess, malposition of JP drain, PICC colonization  - 2/19 repeat CTAP with redemonstration of ECF, decreased air and fluid collection along left lower anterior abdominal wall which is significantly decreased s/p VIR drain 2/7 making new abdominal abscess less likely, however bowel edema with possible ischemic component  - 2/21 VIR paracentesis 150cc amber fluid   - Culture pending   - Cytology pending   - Protein 2.8, Glucose 69, Albumin 1.6  - Plan for PICC removal when glucose is stabilized   - Abdominal exam remains benign, patient afebrile and clinically well appearing without concerns for peritonitis.    Small bowel obstruction - TEG  - On admit denies abdominal pain, nausea, vomiting on admit. Previously tolerating clears  and small bites of food at home   - 2/3 TEG placement  - Home reg: scopolamine patch, using Reglan and Compazine PRN  - 2/19 CTAP persistent small bowel obstruction, likely closed loop component with slight interval increase in enhancement of affected bowel mucosa  - 2/20 Colorectal surgery cs: agree ECF does not appear to be the source of bacteremia given drainage of previously existing fluid collections. Recommend VIR for singoram to evaluate drain at site of ECF  - Benign abdominal exam  ??  Sick Euthyroid Syndrome  - Last admit TSH 0.237, FT4 0.54  ????  Chronic Pain  - S/p Palliative Care consult, pain well controlled on 50 mcg fentanyl patches and Tylenol PRN  ??  Poor Nutrition  - S/p Nutrition consult 2/18  - TPN held in setting of bacteremia  - Currently NPO  ??  Anemia of Chronic Disease   - Admit Hgb 7.9 stable from prior  ??  Prophylaxis: Lovenox  ??  Code Status: full code, confirmed on admission.   ??  Disposition: floor    Plan discussed with Dr. Sherlean Foot. Attending Dr. Reyne Dumas immediately available.    Subjective:   Patient continues to feel fine. Some tenderness s/p paracentesis with associated nausea when having pain, however remains with good symptoms control.     Objective:  Temp:  [36.4 ??C (97.6 ??F)-37.2 ??C (99 ??F)] 37.2 ??C (99 ??F)  Heart Rate:  [81-107] 106  SpO2 Pulse:  [88-109] 109  Resp:  [11-17] 14  BP: (87-151)/(62-92) 115/80  SpO2:  [99 %-100 %] 100 %    -General:                    Well-appearing in NAD.  -CV:                             Regular rate and rhythm  -Chest:                        CTAB. Normal WOB.   -Abd:                           Non-tender, non-distended abdomen.  ECF dressing clean without drainage.  TEG with 100cc green output overnight, JP drain sites without leakage, empty bulb.   -Extremities:              No clubbing, erythema, or edema.   -Psych:                       Appropriate.The patient is awake and alert.    Medications (scheduled)   ??? dexAMETHasone  1 mg Oral Daily    Followed by   ??? [START ON 09/29/2021] dexAMETHasone  0.5 mg Oral Daily   ??? docusate sodium  100 mg Oral Daily   ??? enoxaparin (LOVENOX) injection  40 mg Subcutaneous Q24H   ??? fentaNYL  1 patch Transdermal Q72H   ??? levoFLOXacin  750 mg Intravenous Q24H   ??? lidocaine  1 patch Transdermal Daily   ??? metroNIDAZOLE  500 mg Intravenous [facility]   ??? OLANZapine  2.5 mg Oral Daily   ??? OLANZapine  5 mg Oral Nightly   ??? pantoprazole  40 mg Oral Daily   ??? polyethylene glycol  17 g Oral Daily   ??? scopolamine  1 patch Topical Q72H   ???  vancomycin  1,000 mg Intravenous Q12H       Medications (prn)  acetaminophen, bacitracin-polymyxin b, calcium carbonate, metoclopramide, naloxone, senna, simethicone    Labs:   Lab Results   Component Value Date    WBC 7.2 09/24/2021    HGB 7.7 (L) 09/24/2021    HCT 24.1 (L) 09/24/2021    PLT 305 09/24/2021       Lab Results   Component Value Date    NA 143 09/24/2021    K 3.6 09/24/2021    CL 109 (H) 09/24/2021    CO2 27.0 09/24/2021    BUN 8 (L) 09/24/2021    CREATININE 0.62 09/24/2021    GLU 52 (L) 09/24/2021    CALCIUM 8.7 09/24/2021    MG 1.7 09/24/2021    PHOS 4.4 09/24/2021       Lab Results   Component Value Date    BILITOT 0.3 09/24/2021    BILIDIR 0.10 09/11/2021    PROT 5.2 (L) 09/24/2021    ALBUMIN 2.4 (L) 09/24/2021    ALT 10 09/24/2021    AST 15 09/24/2021    ALKPHOS 58 09/24/2021       Lab Results   Component Value Date    INR 1.14 09/21/2021    APTT 30.7 09/21/2021     Scribe's Attestation: Lance Muss, MD obtained and performed the history, physical exam and medical decision making elements that were entered into the chart. Documentation assistance was provided by me personally, a scribe. Signed by Lorenda Cahill, Scribe, on September 24, 2021 at 11:02 PM.         ----------------------------------------------------------------------------------------------------------------------  September 25, 2021 7:22 AM. Documentation assistance provided by the Scribe. I was present during the time the encounter was recorded. The information recorded by the Scribe was done at my direction and has been reviewed and validated by me.  ----------------------------------------------------------------------------------------------------------------------

## 2021-09-25 NOTE — Unmapped (Signed)
Patient is A&Ox4. VSS, NSR to ST per tele. O2 Saturation WNL on room air. Afebrile. Pain managed with PRN tylenol and oxycodone. BG checked Q2H per order. Patient remains NPO. D5 and 1/2 NS, infusing at 100 ml/hr, per order. No BM this shift, patient up to the toilet throughout shift to void.      Problem: Adult Inpatient Plan of Care  Goal: Plan of Care Review  Outcome: Progressing  Goal: Patient-Specific Goal (Individualized)  Outcome: Progressing  Goal: Absence of Hospital-Acquired Illness or Injury  Outcome: Progressing  Intervention: Identify and Manage Fall Risk  Recent Flowsheet Documentation  Taken 09/24/2021 2000 by Garlon Hatchet, RN  Safety Interventions:  ??? bleeding precautions  ??? fall reduction program maintained  ??? family at bedside  ??? lighting adjusted for tasks/safety  ??? low bed  ??? nonskid shoes/slippers when out of bed  Intervention: Prevent Skin Injury  Recent Flowsheet Documentation  Taken 09/25/2021 0200 by Garlon Hatchet, RN  Skin Protection: adhesive use limited  Taken 09/25/2021 0000 by Garlon Hatchet, RN  Skin Protection: adhesive use limited  Taken 09/24/2021 2200 by Garlon Hatchet, RN  Skin Protection: adhesive use limited  Taken 09/24/2021 2000 by Garlon Hatchet, RN  Skin Protection: adhesive use limited  Intervention: Prevent and Manage VTE (Venous Thromboembolism) Risk  Recent Flowsheet Documentation  Taken 09/25/2021 0000 by Garlon Hatchet, RN  VTE Prevention/Management:  ??? ambulation promoted  ??? anticoagulant therapy  Taken 09/24/2021 2000 by Garlon Hatchet, RN  VTE Prevention/Management:  ??? ambulation promoted  ??? anticoagulant therapy  Intervention: Prevent Infection  Recent Flowsheet Documentation  Taken 09/24/2021 2000 by Garlon Hatchet, RN  Infection Prevention: hand hygiene promoted  Goal: Optimal Comfort and Wellbeing  Outcome: Progressing  Goal: Readiness for Transition of Care  Outcome: Progressing  Goal: Rounds/Family Conference  Outcome: Progressing Problem: Self-Care Deficit  Goal: Improved Ability to Complete Activities of Daily Living  Outcome: Progressing     Problem: Skin Injury Risk Increased  Goal: Skin Health and Integrity  Outcome: Progressing  Intervention: Optimize Skin Protection  Recent Flowsheet Documentation  Taken 09/25/2021 0200 by Garlon Hatchet, RN  Pressure Reduction Techniques: frequent weight shift encouraged  Skin Protection: adhesive use limited  Taken 09/25/2021 0000 by Garlon Hatchet, RN  Pressure Reduction Techniques: frequent weight shift encouraged  Skin Protection: adhesive use limited  Taken 09/24/2021 2200 by Garlon Hatchet, RN  Pressure Reduction Techniques: frequent weight shift encouraged  Skin Protection: adhesive use limited  Taken 09/24/2021 2000 by Garlon Hatchet, RN  Pressure Reduction Techniques: frequent weight shift encouraged  Skin Protection: adhesive use limited     Problem: Heart Failure Comorbidity  Goal: Maintenance of Heart Failure Symptom Control  Outcome: Progressing     Problem: Hypertension Comorbidity  Goal: Blood Pressure in Desired Range  Outcome: Progressing     Problem: Pain Chronic (Persistent) (Comorbidity Management)  Goal: Acceptable Pain Control and Functional Ability  Outcome: Progressing

## 2021-09-26 LAB — COMPREHENSIVE METABOLIC PANEL
ALBUMIN: 2.3 g/dL — ABNORMAL LOW (ref 3.4–5.0)
ALKALINE PHOSPHATASE: 56 U/L (ref 46–116)
ALT (SGPT): 8 U/L — ABNORMAL LOW (ref 10–49)
ANION GAP: 6 mmol/L (ref 5–14)
AST (SGOT): 30 U/L (ref <=34)
BILIRUBIN TOTAL: 0.3 mg/dL (ref 0.3–1.2)
BLOOD UREA NITROGEN: 14 mg/dL (ref 9–23)
BUN / CREAT RATIO: 5
CALCIUM: 8.2 mg/dL — ABNORMAL LOW (ref 8.7–10.4)
CHLORIDE: 111 mmol/L — ABNORMAL HIGH (ref 98–107)
CO2: 24 mmol/L (ref 20.0–31.0)
CREATININE: 2.59 mg/dL — ABNORMAL HIGH
EGFR CKD-EPI (2021) FEMALE: 23 mL/min/{1.73_m2} — ABNORMAL LOW (ref >=60)
GLUCOSE RANDOM: 127 mg/dL (ref 70–179)
POTASSIUM: 3.7 mmol/L (ref 3.4–4.8)
PROTEIN TOTAL: 4.7 g/dL — ABNORMAL LOW (ref 5.7–8.2)
SODIUM: 141 mmol/L (ref 135–145)

## 2021-09-26 LAB — CBC W/ AUTO DIFF
BASOPHILS ABSOLUTE COUNT: 0 10*9/L (ref 0.0–0.1)
BASOPHILS RELATIVE PERCENT: 0.1 %
EOSINOPHILS ABSOLUTE COUNT: 0 10*9/L (ref 0.0–0.5)
EOSINOPHILS RELATIVE PERCENT: 0.2 %
HEMATOCRIT: 22.8 % — ABNORMAL LOW (ref 34.0–44.0)
HEMOGLOBIN: 7.5 g/dL — ABNORMAL LOW (ref 11.3–14.9)
LYMPHOCYTES ABSOLUTE COUNT: 0.7 10*9/L — ABNORMAL LOW (ref 1.1–3.6)
LYMPHOCYTES RELATIVE PERCENT: 8.3 %
MEAN CORPUSCULAR HEMOGLOBIN CONC: 33 g/dL (ref 32.0–36.0)
MEAN CORPUSCULAR HEMOGLOBIN: 27.4 pg (ref 25.9–32.4)
MEAN CORPUSCULAR VOLUME: 83 fL (ref 77.6–95.7)
MEAN PLATELET VOLUME: 7.1 fL (ref 6.8–10.7)
MONOCYTES ABSOLUTE COUNT: 0.7 10*9/L (ref 0.3–0.8)
MONOCYTES RELATIVE PERCENT: 7.9 %
NEUTROPHILS ABSOLUTE COUNT: 7 10*9/L (ref 1.8–7.8)
NEUTROPHILS RELATIVE PERCENT: 83.5 %
PLATELET COUNT: 261 10*9/L (ref 150–450)
RED BLOOD CELL COUNT: 2.75 10*12/L — ABNORMAL LOW (ref 3.95–5.13)
RED CELL DISTRIBUTION WIDTH: 17.2 % — ABNORMAL HIGH (ref 12.2–15.2)
WBC ADJUSTED: 8.4 10*9/L (ref 3.6–11.2)

## 2021-09-26 LAB — VANCOMYCIN, RANDOM: VANCOMYCIN RANDOM: 34.6 ug/mL

## 2021-09-26 LAB — MAGNESIUM: MAGNESIUM: 1.9 mg/dL (ref 1.6–2.6)

## 2021-09-26 LAB — INSULIN-LIKE GROWTH FACTOR 1 (IGF-1)
IGF-1: 124 ng/mL
Z-SCORE: 0.13 {STDV}

## 2021-09-26 LAB — INSULIN ANTIBODY: INSULIN ABS: 0 nmol/L

## 2021-09-26 LAB — PHOSPHORUS: PHOSPHORUS: 4.6 mg/dL (ref 2.4–5.1)

## 2021-09-26 MED ADMIN — scopolamine (TRANSDERM-SCOP) 1 mg over 3 days topical patch 1 mg: 1 | TOPICAL | @ 09:00:00

## 2021-09-26 MED ADMIN — docusate sodium (COLACE) capsule 100 mg: 100 mg | ORAL | @ 13:00:00

## 2021-09-26 MED ADMIN — sodium chloride (NS) 0.9 % infusion: 50 mL/h | INTRAVENOUS | @ 15:00:00 | Stop: 2021-09-26

## 2021-09-26 MED ADMIN — metroNIDAZOLE (FLAGYL) tablet 500 mg: 500 mg | ORAL | @ 19:00:00 | Stop: 2021-09-28

## 2021-09-26 MED ADMIN — ondansetron (ZOFRAN) injection 4 mg: 4 mg | INTRAVENOUS | @ 21:00:00

## 2021-09-26 MED ADMIN — metoclopramide (REGLAN) tablet 10 mg: 10 mg | ORAL | @ 14:00:00

## 2021-09-26 MED ADMIN — dextrose 10 % and sodium chloride 0.45 % infusion: 75 mL/h | INTRAVENOUS | @ 19:00:00 | Stop: 2021-09-26

## 2021-09-26 MED ADMIN — senna (SENOKOT) tablet 2 tablet: 2 | ORAL | @ 23:00:00

## 2021-09-26 MED ADMIN — ondansetron (ZOFRAN) injection 4 mg: 4 mg | INTRAVENOUS | @ 15:00:00 | Stop: 2021-09-26

## 2021-09-26 MED ADMIN — dextrose 10 % and sodium chloride 0.45 % infusion: 75 mL/h | INTRAVENOUS | @ 05:00:00

## 2021-09-26 MED ADMIN — cefepime (MAXIPIME) 1 g in sodium chloride 0.9 % (NS) 100 mL IVPB-connector bag: 1 g | INTRAVENOUS | @ 13:00:00 | Stop: 2021-10-01

## 2021-09-26 MED ADMIN — OLANZapine (ZyPREXA) tablet 5 mg: 5 mg | ORAL | @ 02:00:00

## 2021-09-26 MED ADMIN — pantoprazole (PROTONIX) EC tablet 40 mg: 40 mg | ORAL | @ 13:00:00

## 2021-09-26 MED ADMIN — metroNIDAZOLE (FLAGYL) tablet 500 mg: 500 mg | ORAL | @ 13:00:00 | Stop: 2021-09-28

## 2021-09-26 MED ADMIN — dexAMETHasone (DECADRON) tablet 1 mg: 1 mg | ORAL | @ 13:00:00 | Stop: 2021-09-29

## 2021-09-26 MED ADMIN — lidocaine (LIDODERM) 5 % patch 1 patch: 1 | TRANSDERMAL | @ 19:00:00

## 2021-09-26 MED ADMIN — fluconazole (DIFLUCAN) 2 mg/mL in sodium chloride 0.9% 200 mg: 200 mg | INTRAVENOUS | @ 22:00:00

## 2021-09-26 MED ADMIN — metroNIDAZOLE (FLAGYL) tablet 500 mg: 500 mg | ORAL | @ 02:00:00 | Stop: 2021-09-28

## 2021-09-26 NOTE — Unmapped (Signed)
VASCULAR INTERVENTIONAL RADIOLOGY INPATIENT CVC CONSULTATION     Requesting Attending Physician: [name], *  Service Requesting Consult: Med Bernita Raisin [facility])    Date of Service: 09/26/2021  Consulting Interventional Radiologist: Dr. Benjamine Mola     HPI:     Reason for consult: Tunneled catheter placement    History of Present Illness:   Colleen Russo is a 46 y.o. female  with ovarian carcinoma with history of multiple SBOs, and known EC fistula. Given her recurrent obstructive symptoms, she was taken to the OR on 01/23/21 for diagnostic laparoscopy converted to exploratory laparotomy which was c/b an unavoidable small bowel enterotomy. One month post-op, she developed an ECF.  Patient has continued to have complications related to persistent EC fistula.  Patient now requires TPN.  Patient seen in consultation at the request of primary care team for consideration for tunneled catheter placement for long-term TPN.  Patient has a history of indwelling PICC that was subsequently removed due to positive blood cultures.      Review of Systems:  Pertinent items are noted in HPI.    Medical History:     Past Medical History:  No past medical history on file.    Surgical History:  Past Surgical History:   Procedure Laterality Date   ??? IR INSERT G-TUBE PERCUTANEOUS  09/06/2021    IR INSERT G-TUBE PERCUTANEOUS 09/06/2021 Colleen Fudge, MD IMG VIR [facility]       Family History:  No family history on file.    Medications:   Current Facility-Administered Medications   Medication Dose Route Frequency Provider Last Rate Last Admin   ??? acetaminophen (TYLENOL) tablet 650 mg  650 mg Oral Q6H PRN [name], MD   650 mg at 09/25/21 0002   ??? calcium carbonate (TUMS) chewable tablet 200 mg of elem calcium  200 mg of elem calcium Oral TID PRN [name], MD       ??? cefepime (MAXIPIME) 1 g in sodium chloride 0.9 % (NS) 100 mL IVPB-connector bag  1 g Intravenous Q12H [facility] [name], MD   Stopped at 09/26/21 [phone]   ??? dexAMETHasone (DECADRON) tablet 1 mg  1 mg Oral Daily [name], MD   1 mg at 09/26/21 9604    Followed by   ??? [START ON 09/29/2021] dexAMETHasone (DECADRON) tablet 0.5 mg  0.5 mg Oral Daily [name], MD       ??? dextrose 10 % and sodium chloride 0.45 % infusion  75 mL/hr Intravenous Continuous [name], MD 75 mL/hr at 09/25/21 2331 75 mL/hr at 09/25/21 2331   ??? docusate sodium (COLACE) capsule 100 mg  100 mg Oral Daily [name], MD   100 mg at 09/26/21 0816   ??? fentaNYL (DURAGESIC) 50 mcg/hr 1 patch  1 patch Transdermal Q72H [name], MD   1 patch at 09/24/21 0022   ??? lidocaine (LIDODERM) 5 % patch 1 patch  1 patch Transdermal Daily [name], MD   1 patch at 09/25/21 0859   ??? metoclopramide (REGLAN) tablet 10 mg  10 mg Oral TID PRN [name], MD   10 mg at 09/26/21 5409   ??? metroNIDAZOLE (FLAGYL) tablet 500 mg  500 mg Oral TID [name], MD   500 mg at 09/26/21 0816   ??? naloxone [facility]) injection 0.1 mg  0.1 mg Intravenous Q5 Min PRN [name], MD       ???  OLANZapine (ZyPREXA) tablet 2.5 mg  2.5 mg Oral Daily [name], MD   2.5 mg at 09/24/21 0826   ??? OLANZapine (ZyPREXA) tablet 5 mg  5 mg Oral Nightly [name], MD   5 mg at 09/23/21 2006   ??? ondansetron (ZOFRAN) injection 4 mg  4 mg Intravenous Q8H PRN [name], MD   4 mg at 09/26/21 1026   ??? pantoprazole (PROTONIX) EC tablet 40 mg  40 mg Oral Daily [name], MD   40 mg at 09/26/21 0816   ??? polyethylene glycol (MIRALAX) packet 17 g  17 g Oral Daily [name], MD       ??? scopolamine (TRANSDERM-SCOP) 1 mg over 3 days topical patch 1 mg  1 patch Topical Q72H [name], MD   1 mg at 09/26/21 0353   ??? senna (SENOKOT) tablet 2 tablet  2 tablet Oral BID PRN [name], MD   2 tablet at 09/25/21 0859   ??? simethicone (MYLICON) chewable tablet 80 mg  80 mg Oral Q6H PRN [name], MD   80 mg at 09/25/21 1144   ??? sodium chloride (NS) 0.9 % infusion  50 mL/hr Intravenous Continuous [name], MD 50 mL/hr at 09/26/21 0932 50 mL/hr at 09/26/21 0932   ??? Vancomycin - Pharmacy dosing by levels   Other Pharmacy dosing [name], MD           Allergies:  Carboplatin and Ketamine    Social History:  Social History     Tobacco Use   ??? Smoking status: Never   ??? Smokeless tobacco: Never       Objective:      Vital Signs:  Temp:  [36.9 ??C (98.4 ??F)-37.4 ??C (99.3 ??F)] 36.9 ??C (98.4 ??F)  Heart Rate:  [97-123] 99  SpO2 Pulse:  [102-124] 102  Resp:  [14-21] 19  BP: (118-141)/(69-103) 137/94  MAP (mmHg):  [85-114] 106  SpO2:  [99 %-100 %] 100 %    Physical Exam:      Vitals:    09/26/21 1150   BP: 137/94   Pulse: 99   Resp: 19   Temp: 36.9 ??C (98.4 ??F)   SpO2: 100%     ASA Grade: ASA 3 - Patient with moderate systemic disease with functional limitations  General: No apparent distress.  Lungs: Breathing comfortably on RA.  Heart:  Regular rate and rhythm.   Neuro: No obvious focal deficits.    Airway assessment: Class 1 - Can visualize soft palate, fauces, uvula, and tonsillar pillars    Diagnostic Studies:  None Relavent    Labs:    Recent Labs     09/24/21  0606 09/25/21  0624 09/26/21  0516   WBC 7.2 6.3 8.4   HGB 7.7* 7.2* 7.5*   HCT 24.1* 21.6* 22.8*   PLT 305 239 261     Recent Labs     09/24/21  0606 09/24/21  0853 09/25/21  0624 09/26/21  0516   NA 143  --  142 141   K 3.6  --  3.2* 3.7   CL 109*  --  108* 111*   BUN 8*  --  14 14   CREATININE 0.62  --  1.87* 2.59*   GLU 61* 52* 77 127     Recent Labs     09/24/21  0606 09/25/21  0624 09/26/21  0516   PROT 5.2* 4.5* 4.7*  ALBUMIN 2.4* 2.1* 2.3*   AST 15 30 30    ALT 10 7* 8*   ALKPHOS 58 53 56   BILITOT 0.3 0.3 0.3     No results for input(s): INR, APTT, FIBRINOGEN in the last 72 hours.    Blood Cultures Pending:  No.  Does Anticoagulation need to be held:  No.    Assessment and Recommendations:     Ms. North is a 46 y.o. female with medical/surgical history per HPI with ongoing EC fistula requiring tunneled catheter placement for long-term TPN.  Patient has a PTEG via the left side of her neck.  Patient has a history of right-sided port catheter that was removed 03/2021.  Patient currently with AKI and deemed an inappropriate PICC candidate at this time.     We will plan to attempt a dual-lumen tunneled catheter placement via the right side given her left-sided PTEG    Recommendations:  - Proceed with placement of Power Line - double lumen  - Anticipated procedure date: Tomorrow pending VIR schedule  - Please make NPO night prior to procedure      Informed Consent:  This procedure and sedation has been fully reviewed with the patient/patient???s authorized representative. The risks, benefits and alternatives have been explained, and the patient/patient???s authorized representative has consented to the procedure.  --The patient will accept blood products in an emergent situation.  --The patient does not have a Do Not Resuscitate order in effect.        Thank you for involving [place] in the care of this patient. Please page the VIR consult pager [phone]) with further questions, concerns, or if new issues arise.

## 2021-09-26 NOTE — Unmapped (Signed)
""  VENOUS ACCESS TEAM PROCEDURE    Order was placed for a \""PIV by Venous Access Team (VAT)\"".  Patient was assessed at bedside for placement of a PIV. PPE were donned per protocol.  Access was obtained. Blood return noted.  Dressing intact and device well secured.  Flushed with normal saline.  See LDA for details.  Pt advised to inform RN of any s/s of discomfort at the PIV site.    Workup / Procedure Time:  15 minutes       RN was notified.       Thank you,     Delma Villalva C Jasten Guyette RN Venous Access Team""

## 2021-09-26 NOTE — Unmapped (Addendum)
Division of Infectious Diseases  General Inpatient Consultation Service     For any questions about this consult, page [phone] (Gen C Follow-up Pager).      Colleen Russo is being seen in consultation at the request of [name], MD for evaluation and management of bacteremia and abd wall abscess/enterocutaneous fistula.       PLAN FOR 09/26/2021    Diagnostic  ??? Follow pending blood cultures and peritoneal cultures  ??? Follow up abdominal swab culture from 2/19: ID has requested fluconazole susceptibility testing, due to return on or around 10/03/21    ??? Work up for AKI per primary team  ??? Monitor for antimicrobial toxicity with the following:  ??? Daily CBCs w dif  for now (given leukopenia), bmps at least multiple times per week monitoring renal function  ??? Please check EKG as last QTc ~460    Treatment  ??? HOLD vancomycin (dosed by pharmacy for goal trough 10-15) but please continue trending levels  ??? START Fluconazole, which will need to be renally dose adjusted d/t findings of Candida dubliniensis on abdominal swab and persistent drainage. Would limit to 10-14d course.  ??? Continue Flagyl   ??? Continue Cefepime 1g Q12, this may need further dose adjustments pending CrCl, please renally adjust  ??? Duration of therapy = tbd  o start date = 2/19  o end date = tbd    I discussed the plans for today with patient on 09/26/2021.    Our service will continue to follow.    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     September 24, 2021   Patient has: [x]  acute illness w/systemic sxs  [mod] [x]  illness posing risk to life or function  [high]   I reviewed:   (3+) [x]  primary team note [x]  consultant note(s) []  procedure/op note(s) [x]  micro result(s)    [x]  CBC results [x]  chemistry results []  radiology report(s) []  w/indep. historian   I independently visualized:   (any)   []  cxs/plates in lab []  plain film images []  CT images []  PET images    []  path slide(s) []  ECG tracing []  MRI images []  nuclear scan   I discussed: (any) [x]  micro and/or path w/lab personnel []  drug options and/or interactions w/ID pharmD    []  procedure/OR findings w/other MD(s) []  echo and/or imaging w/other MD(s)    []  mgm't w/attending(s) involved in case []  setting up home abx w/OPAT team   Mgm't requires: []  prescription drug(s)  [mod] [x]  intensive toxicity monitoring  [high]   Minutes: []  25-34      [x]  35-49      []  50+ []  30-74 providing critical care on the unit     Interval notes & result reports show:  Remains afebrile. Hypoglycemia improving. PICC line removed 09/25/21    Patient reports: Has some suprapubic 'discomfort' towards the end of urination.  New purulence just superior to fistula site. States the rash on her arm has been stable. Stable appearance of lower extremity 'rash' as well. No new diarrhea. Some nausea though no emesis. No subjective fever.    My interpretation of the data I reviewed is: WBC normal and stable. Cr 2.59 and increased from 0.62 previously. Hypokalemia @ 3.7, improved from 3.2 previously.     # Polymicrobial Bacteremia: Bacillus cereus, Staph hominis, Staph epi bacteremia   - acute, poses threat to life or bodily function  [high]  Staph hominis now in 3 separate peripheral blood cultures and 1 blood culture via PICC collected across ~13h on 2/18 and 2/19, clearly a true infection. B. cereus in 2/2 peripheral cultures from 2/18, not growing (yet) on subsequent. Staph epi not in initial cultures but in 2 of 2 (peripheral, PICC) on 2/19, so these also likely represent true bacteremia. Both can be found in the GI tract and also (esp.the Staph) on skin and wounds, so an initial source of the enterocutaneous fistula or elsewhere in the GI tract is most likely, but suspect PICC is now infected. We generally treat true bacteremia of each of these pathogens with at least 7 days of IV antibiotics, sometimes up to 14 days. If she were to remain bacteremic on subsequent cultures I would recommend a TTE to evaluate for endocarditis. PICC removed 09/25/21.   ??? Follow repeat blood cultures  to evaluate for clearance. Planned duration will be at least 7 total days of active abx (ie from vanc start date) ; tentatively 09/21/21-09/28/21    # Enterocutaneous fistula, abdominal wall abscess, possible peritonitis   - acute, poses threat to life or bodily function  [high]  S/p abscess drain placement by VIR 2/7 w/ repeat CT 2/19 and drain output suggestive that abscess has resolved or nearly resolved. Though, continues to have frank purulence on 09/26/21 exam-- which may represent residual abscess fluid or residual fistula. Perhaps most concerningly, has poor prognosis for bowel obstruction and for healing her enterocutaneous fistula, per gyn-onc, so will likely remain at high risk for persistent or new intraabdominal infections. Despite broad spectrum abx, patient continues to have ongoing purulent drainage. Abdominal swab (taken by nursing on 2/19) does show anaerobes and candida dubliniensis. While this yeast is often a colonizing pathogen, in her context, this may be contributing to ongoing pathogenic infection. As such, would opt to cover this (routinely susceptible to fluconazole, but full susceptibilities will be run/made available around 10/03/21 per discussion with microbiology on 09/26/21).  - Peritoneal studies revealed monocytic predominance of cells (351 clumped nucleated cells, 2% neut) w/ SAAG 0.8 and protein 2.8 which can be consistent with underlying malignancy  -Cytology pending    # AKI, worsening 09/25/21  Noted 09/25/21, 0.62->1.87. Insults include possible hypovolemia, Vancomycin and contrast  -Recommend workup per primary team, consider UA    # Leukopenia, stable   - acute, undx'd new problem w/uncertain prognosis  [mod]  ??? 2.9 on 2/20, down from baseline of 6-7, resolved the next day.  Continue to trend CBCs- would do with dif daily for now, given rash.       # forearm rash   - acute, undx'd new problem w/uncertain prognosis  [mod]  ??? Fairly small area, not typical drug rash appearance, improved on 2/21. Monitor.    # Management of prescription antimicrobials needing intensive toxicity monitoring  Metronidazole can cause rashes, dysgeusia (altered taste), peripheral neuropathy, and/or myelosuppression.  Quinolones can cause rashes, N/V/D, CNS effects,  MSK effects, arrhythmias and/or QTc prolongation, glucose abnormalities, and/or neuropathies.  Vancomycin can cause back pain, various dermatological effects, thrombocytopenia, neutropenia, and/or nephrotoxicity.   ??? See recommendations in blue box above.    # Disposition   -Likely home once improved. Likely OPAT candidate if prolonged IV antibiotics were to be required.         Antimicrobials & Other Medications   Vanc IV 2/18-  Flagyl 500 PO q  2/8-  Cefepime 2/22-    Prior:  Levofloxacin 750 IV q 24 2/8 -     Immunomodulators   Dexamethasone taper 2/10-2/17, hydrocortisone/dexamethasone 2/18-present  - last chemo ~9 months ago    Current Medications as of 09/26/2021  Scheduled  PRN   Cefepime, 1 g, Q12H SCH  dexAMETHasone, 1 mg, Daily   Followed by  Melene Muller ON 09/29/2021] dexAMETHasone, 0.5 mg, Daily  docusate sodium, 100 mg, Daily  fentaNYL, 1 patch, Q72H  lidocaine, 1 patch, Daily  metroNIDAZOLE, 500 mg, TID  OLANZapine, 2.5 mg, Daily  OLANZapine, 5 mg, Nightly  pantoprazole, 40 mg, Daily  polyethylene glycol, 17 g, Daily  scopolamine, 1 patch, Q72H  Vancomycin - Pharmacy dosing by levels, , Pharmacy dosing      acetaminophen, 650 mg, Q6H PRN  bacitracin-polymyxin b, , Code/Trauma/Sedation Med  calcium carbonate, 200 mg of elem calcium, TID PRN  metoclopramide, 10 mg, TID PRN  naloxone, 0.1 mg, Q5 Min PRN  senna, 2 tablet, BID PRN  simethicone, 80 mg, Q6H PRN           Physical Exam     Temp:  [36.9 ??C (98.5 ??F)-37.4 ??C (99.3 ??F)] 37.2 ??C (99 ??F)  Heart Rate:  [97-123] 102  SpO2 Pulse:  [102-124] 102  Resp:  [14-21] 15  BP: (118-141)/(69-103) 119/69  MAP (mmHg):  [85-114] 86  SpO2:  [99 %-100 %] 99 %    Actual body weight: 74.6 kg (164 lb 7.4 oz)  Ideal body weight: 50.1 kg (110 lb 7.2 oz)  Adjusted ideal body weight: 59.9 kg (132 lb 0.9 oz)     CONST: Vital signs above. Non-toxic appearance, no acute distress  EYES: anicteric sclerae. EOMI.   ENMT: Normal appearance of external nose and ears. Dry MM.   NECK: supple, feeding tube drain entering through L neck without surrounding erythema though there dried crusted serous drainage around tube, trachea midline.  CV: RRR, no m/r/g. No LE edema bilaterally.   PULM: CTAB, no wheezes or rales  ABD: soft,  ND, +BS, very mild tenderness to palpation in all 4 quadrants without rebound or guarding. Bandaged. Green-yellow purulence seen just superior to fistula. Scarred skin and JP drain exiting mid abdomen with completely clear fluid in bulb.  GU: no urinary catheter in place  SKIN: 1-2 dozen pinpoint, pinkish, nonblanchable macules on R forearm, slightly faded and improved from 2/22. No rashes noted elsewhere on clothed exam. Skin warm and dry.   NEURO: CN II-XII grossly inact. moving all 4 extremities  PSYCH: normal mood and affect. Fully alert and oriented.     Patient Lines/Drains/Airways Status     Active Active Lines, Drains, & Airways     Name Placement date Placement time Site Days    Closed/Suction Drain Left LUQ Bulb 10 Fr. 09/10/21  1104  LUQ  15    Open Drain 1 Anterior;Left Neck 09/06/21  --  Neck  20    Peripheral IV 09/25/21 Anterior;Right Forearm 09/25/21  1835  Forearm  less than 1  Data for ID Decision Making  ( IDGENCONMDM )       Micro & Serological Data   ( RSLTMICRO  /  Dorice Lamas  /  00CXSRC  /  00CXRES  /  00CXSUSC )  2/21 bld cx x2: NGTD  2/19 abdominal swab (taken by nursing at bedside): Enteric flora. Per discussion and review of plates with microbiology on 2/23, the predominant pathogen is candida dubliniensis w/ 1+ anaerobes. No other gram negatives seen. This isolate was referred to susceptibility testing 09/26/21 w/ plan to result by 10/03/21  2/19 bld cx x2: Staph hominis in 2/2 AND Staph epi in 2/2 (Mec A detected)  2/18 bld cx: Bacillus cereus (2/2), Staph hominis (2/2)  2/17 ucx neg  2/7 abd aspirate: gram: 2+ GPR. Cx: 4+ mixed GP/GN orgs, incl. 3+ Citrobacter freundii (R unasyn, aztreo, cefazolin, ceftaz, ctx, I zosyn)), 2+ PsA (pan suscep), 4+ Strep anginosus  1/28 HIV neg    Recent Studies  ( RISRSLT )  2/19 CT abd pelvis w/ IV [id number]:  1. Persistent dilatation of small bowel loops with several transition points compatible with complex small bowel obstruction with likely closed loop component. There is slight interval increase in enhancement of the affected bowel mucosa. Recommend correlation with lactate and/or surgical consultation for ischemia.  ??  2. Scattered foci of gas in the anterior peritoneum. These do not definitively technique with the enterocutaneous fistula and may represent focal walled off perforation.  ??  3. New enhancement of peritoneal ascites concerning for peritonitis.  ??  4. Decreased size of gas and fluid containing collection in the left anterior abdominal wall arising from the known enterocutaneous fistula status post drain placement.    ADDENDUM:   (09/22/2021 8:26 PM)  On review, the following additional findings were noted:  ??  Limited exam in the absence of oral contrast.   ??  Redemonstration of a complex enterocutaneous fistula with associated soft tissue stranding at the level of the anterior midline abdomen. There has been interval placement of a pigtail drainage catheter within the previously-described air and fluid collection along the left lower anterior abdominal wall, which is significantly decreased in size when compared to prior exam.  ??  Numerous persistently dilated loops of small bowel are seen throughout the abdomen and pelvis, which demonstrate air-fluid levels and measure up to 3.7 cm in diameter. There is similar tethering of the small bowel along the anterior peritoneum with abrupt collapse at this level, thought likely to reflect a transition point (2:72). The distal small bowel appears mostly decompressed to the level of the cecum. These findings remain worrisome for small bowel obstruction.   ??  Low attenuation abdominopelvic ascites (~18 HU) and diffuse peritoneal thickening/enhancement is similar to prior exam. While these findings, could potentially be seen in the setting of peritonitis, could also be seen with diffuse peritoneal disease spread and associated malignant ascites. Recommend clinical correlation.  ??  Elsewhere throughout the abdomen multiple loops of small demonstrate short segments of wall thickening, hyperenhancement, and luminal narrowing (2:63; 2:94), which may reflect sites of serosal involvement and associated stricturing        Initial Consult Documentation from September 23, 2021     Sources of information include: chart review, patient and treating providers.    History of Present Illness:     Ms. Dimanche was admitted to [facility] 2/17 with persistent hypoglycemia to 30s despite holding long acting insulin. This started a few days  after discharge home 2/10 from  her last admission. Reports she experienced periods of lightheadness, decreased peripheral vision and is told she had slurred speech during these episodes, during which her glucose was found to be low. She would also often feel hot or chilled when her sugar was low, not oat other times. No rigors.     Reports full adherence to levo/flagyl she was taking  for abdominal abscess s/p VIR drainage 2/7. She reports that either as soon as or within a day or two after going home the output into her abdominal/fistula site JP drain stopped, with no drainage (other than the fluid she flushes in) since. She has had very scant greenish drainage from the fistula/abscess exit site area- enough to stain overlying dressing but not enough to soak through or really be visible on the skin. Reports her abdominal pain is unchanged.     Blood cultures were drawn, which grew Bacillus and a coag negative staph in both peripheral blood cultures. She has remained afebrile without infectious symptoms. After initial admission to the MICU, transferred to the floor.     When blood cultures grew, vancomycin was added.     Today, she noticed a R forearm rash with a few dozen small, pinpoint red macules. It is not itchy or painful. No rash or lesions she has noticed elsewhere.     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  -CT 12/2020 stable disease  - 01/23/21:??Diagnostic laparoscopy, exploratory laparotomy, unavoidable enterotomy with repair  -02/2021: noted to have EC fistuala,??OR on 7/2 for incision, exploration, and drainage of subcutaneous abscess??(6x8 cm) lateral to recent ex-lap incision. No definitive succus seen intra-op.??Treated conservatively with bowel rest and TPN for 1 month then had several I&D of recollected abscess at site of EC fistula with drain placed and left until clinical and imaging evidence of resolution????  -12/29-08/07/21 admission for partial SBO and abdominal wall fluid collection, biopsy of fluid collection consistent at first for upper GI or pancreatobiliary primary??  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 09/2021  -IDDM2  -poor nutrition  -anemia of chronic dz  -chronic pain on fentanyl patches    Meds and Allergies  Patient has a current medication list which includes the following prescription(s): acetaminophen, bisacodyl, calcium carbonate, cyclophosphamide, docusate sodium, famotidine, fentanyl, glucose, hydroxyzine, insulin lispro, insulin nph, lidocaine, metoclopramide, olanzapine, olanzapine, omeprazole, polyethylene glycol, prochlorperazine, scopolamine, senna, simethicone, sodium chloride, and [DISCONTINUED] fentanyl, and the following Facility-Administered Medications: acetaminophen, bacitracin-polymyxin b, calcium carbonate, cefepime (MAXIPIME) 1 g in sodium chloride 0.9 % (NS) 100 mL IVPB-connector bag, dexamethasone **FOLLOWED BY** [START ON 09/29/2021] dexamethasone, dextrose 10 % and sodium chloride 0.45 %, docusate sodium, fentanyl, lidocaine, metoclopramide, metronidazole, naloxone, olanzapine, olanzapine, pantoprazole, polyethylene glycol, scopolamine, senna, simethicone, sodium chloride, vancomycin - pharmacy dosing by levels.    Allergies: Carboplatin and Ketamine       Social History  Patient  reports that she has never smoked. She has never used smokeless tobacco. Lives at home.

## 2021-09-26 NOTE — Unmapped (Signed)
PICC LINE REMOVAL PROCEDURE NOTE    Proper PPE were utilized.  PICC line was removed per protocol.  Hemostasis was achieved.  Patient tolerated procedure well.  Gauze and transparent dressing placed.  Verbal and written site care instructions were provided.  Patient verbalized understanding.      PICC tip intact.  Measurement upon removal corresponds with insertion record      Thank Orie Fisherman RN Venous Access Team [phone]     Workup Time:  15 minutes

## 2021-09-26 NOTE — Unmapped (Signed)
VENOUS ACCESS TEAM PROCEDURE    Order was placed for a PIV by Venous Access Team (VAT).  Patient was assessed at bedside for placement of a PIV. PPE were donned per protocol.  Access was obtained. Blood return noted.  Dressing intact and device well secured.  Flushed with normal saline.  See LDA for details.  Pt advised to inform RN of any s/s of discomfort at the PIV site.    Workup / Procedure Time:  15 minutes       primary care  RN was notified.       Thank you,     Cyndie Mull RN Venous Access Team

## 2021-09-26 NOTE — Unmapped (Signed)
Pt A&Ox4. VS stable, on RA. Repositioned frequently. PICC line removed today to replace with a PIV. Family at bedside. Will continue to monitor.     Problem: Adult Inpatient Plan of Care  Goal: Plan of Care Review  Outcome: Progressing     Problem: Adult Inpatient Plan of Care  Goal: Patient-Specific Goal (Individualized)  Outcome: Progressing     Problem: Adult Inpatient Plan of Care  Goal: Absence of Hospital-Acquired Illness or Injury  Outcome: Progressing  Intervention: Identify and Manage Fall Risk  Recent Flowsheet Documentation  Taken 09/25/2021 0800 by Olive Bass, RN  Safety Interventions:   bleeding precautions   fall reduction program maintained   family at bedside   lighting adjusted for tasks/safety   low bed   nonskid shoes/slippers when out of bed  Intervention: Prevent Skin Injury  Recent Flowsheet Documentation  Taken 09/25/2021 1400 by Olive Bass, RN  Skin Protection: adhesive use limited  Taken 09/25/2021 1200 by Olive Bass, RN  Skin Protection: adhesive use limited  Taken 09/25/2021 1000 by Olive Bass, RN  Skin Protection: adhesive use limited  Taken 09/25/2021 0800 by Olive Bass, RN  Skin Protection: adhesive use limited  Intervention: Prevent and Manage VTE (Venous Thromboembolism) Risk  Recent Flowsheet Documentation  Taken 09/25/2021 1600 by Olive Bass, RN  VTE Prevention/Management:   ambulation promoted   anticoagulant therapy  Taken 09/25/2021 1200 by Olive Bass, RN  VTE Prevention/Management: ambulation promoted  Taken 09/25/2021 0800 by Olive Bass, RN  VTE Prevention/Management:   ambulation promoted   anticoagulant therapy  Intervention: Prevent Infection  Recent Flowsheet Documentation  Taken 09/25/2021 0800 by Olive Bass, RN  Infection Prevention: hand hygiene promoted     Problem: Adult Inpatient Plan of Care  Goal: Optimal Comfort and Wellbeing  Outcome: Progressing     Problem: Adult Inpatient Plan of Care  Goal: Absence of Hospital-Acquired Illness or Injury    Problem: Adult Inpatient Plan of Care  Goal: Readiness for Transition of Care  Outcome: Progressing     Problem: Pain Chronic (Persistent) (Comorbidity Management)  Goal: Acceptable Pain Control and Functional Ability  Outcome: Progressing

## 2021-09-26 NOTE — Unmapped (Signed)
PARENTERAL NUTRITION CONSULTATION NOTE     Requesting Attending Physician :  [name], *    Reason for Consult:    Colleen Russo is a 46 y.o. female seen in consultation at the request of Dr. [name], * for evaluation of  parenteral nutrition.    NUTRITION ASSESSMENT     Anthropometric Data:  Height: 157.5 cm (5' 2)   Admission weight: 74.2 kg (163 lb 9.3 oz)  Last recorded weight: 74.6 kg (164 lb 7.4 oz)  IBW: 49.97 kg  Percent IBW:    BMI: Body mass index is 30.08 kg/m??.   Usual Body Weight:  222 lb    Weight history prior to admission: reported weight loss of 78 lbs in 7 months (35%). 20 lb weight re-gain over the past month ? in part fluid vs scale descepency    Wt Readings from Last 10 Encounters:   09/25/21 74.6 kg (164 lb 7.4 oz)   09/12/21 64.5 kg (142 lb 4.8 oz)        Weight changes this admission:   Last 5 Recorded Weights    09/24/21 0445 09/25/21 0112   Weight: 74.2 kg (163 lb 9.3 oz) 74.6 kg (164 lb 7.4 oz)        Nutrition Focused Physical Exam:  Unable to complete at this time due to per patient preferance visit conducted while walking today      NUTRITIONALLY RELEVANT DATA     Parenteral Nutrition Formula:  Recent TPN Orders (Show up to 1 orders ; newest on the right.)       Start date and time  09/27/2021 0000       Adult Parenteral Nutrition [[id number]]       linked to   fat emulsion 20 % with fish oil (SMOFLIPID) infusion 250 mL     Order Status Active     Frequency Continuous        Macro Ingredients    amino acid (CLINISOL SF) 15% 70 g     dextrose 240 g     fat emulsion 20 % with fish oil 250 mL *        Electrolytes    sodium acetate 110 mEq     potassium phosphate 15 mmol     magnesium sulfate dilution 8 mEq        Additives    trace elements (TRALEMENT) Zn-Cu-Mn-Se 1 mL        Medications    thiamine 100 mg        QS Base    sterile water 1,524.53 mL        Energy Contribution    Proteins 280 kcal     Dextrose 816 kcal     Lipids 500 kcal * Total 1,596 kcal        Electrolyte Ion Calculated Amount    Sodium 110 mEq     Potassium 22 mEq     Calcium --     Magnesium 8 mEq     Aluminum --     Phosphate 15 mmol     Chloride --     Acetate 110 mEq     Chloride: Acetate Ratio --        Trace Elements    Copper 0.3 mg     Manganese 55 mcg     Selenium 60 mcg     Zinc 3 mg        Other    Total Amino Acid  70 g     Total Amino Acid/kg 0.94 g/kg     Glucose Infusion Rate 2.23 mg/kg/min     Osmolarity 879.24     Volume 2,400 mL     Rate 100 mL/hr     Dosing Weight 74.6 kg     Infusion Site Peripheral        Admin Instructions      Please infuse using a parenteral nutrition tubing set with a 1.2-micron filter           * Lipid contribution from the linked fat emulsion order. Any non-lipid contribution from the associated lipid order is not included.              Labs:   Results in Past 7 Days  Result Component Current Result   Sodium 141 (09/26/2021)   Potassium 3.7 (09/26/2021)   Chloride 111 (H) (09/26/2021)   CO2 24.0 (09/26/2021)   BUN 14 (09/26/2021)   Creatinine 2.59 (H) (09/26/2021)   Glucose 127 (09/26/2021)   Calcium 8.2 (L) (09/26/2021)   Ionized Calcium Not in Time Range   Magnesium 1.9 (09/26/2021)   Phosphorus 4.6 (09/26/2021)   Albumin 2.3 (L) (09/26/2021)   Total Bilirubin 0.3 (09/26/2021)   Bilirubin, Direct Not in Time Range   AST 30 (09/26/2021)   ALT 8 (L) (09/26/2021)   Alkaline Phosphatase 56 (09/26/2021)   Triglycerides Not in Time Range     Glucose, POC (mg/dL)   Date Value   16/05/9603 149   09/26/2021 131   09/25/2021 122         Nutrition History:   September 26, 2021: Prior to admission:  TPN initiated 1/31 on prior admit   issues with small bowel obstructions, and now with EC fistula. TPN has been on hold since  2/17 for positive BC. Now with negative cultures x 48 hours. PICC removed, however unable to be replaced with elevated Creatinine currently. Planned for tunneled line placement with VIR and peripheral PN (PPN) as bridge to central access. Allergies, Intolerances, Sensitivities, and/or Cultural/Religious Dietary Restrictions: none identified at this time     Current Nutrition:  NPO  Parenteral nutrition via peripheral IV placed 09/25/21       Nutrition Orders   (From admission, onward)                 Start     Ordered    09/23/21 0904  NPO Sips with meds; Medically necessary  Effective now        Comments: Sips of clear liquids with meds (apple juice etc)   Question Answer Comment   NPO Except: Sips with meds    Reason: Medically necessary        09/23/21 0904                       Nutritional Needs:   Daily Estimated Nutrient Needs:  Energy: 1550-1860 kcals 25-30 kcal/kg using other (comment) (recent lowest recorded weight 62 kg (09/08/21)), 62 kg (09/26/21 1430)]  Protein: 93-124 gm [1.5-2.0 gm/kg using other (comment) (recent lowest recorded weight 62 kg (09/08/21)), 62 kg (09/26/21 1430)]  Carbohydrate:   [45-60% of kcal]  Fluid:   mL [per MD team]      Malnutrition assessment not yet completed at this time due to inability to complete nutrition focused physical exam (NFPE).    GOALS and EVALUATION     Patient to meet 60% or greater of nutritional needs via parenteral nutrition within  while remains on TPN .  - New    Motivation, Barriers, and Compliance:  Evaluation of motivation, barriers, and compliance completed. No concerns identified at this time.     NUTRITION ASSESSMENT     Patient appropriate for parenteral nutrition based on the following ASPEN criteria of EC fistula .  Patient will no longer need dextrose in IV fluids starting at midnight when parenteral nutrition (PN) starts.    Planned for Peripheral PN (PPN) as bridge to central access, given need for prolonged NPO/TPN holds   Patient with 300 mcg octreotide in prior home TPN, she has not received octreotide while admit. Per discussion with MD will hold off on added octreotide to TPN tonight.     Discharge Planning:   Monitor for potential discharge needs with multi-disciplinary team. NUTRITION INTERVENTIONS and RECOMMENDATION     Planned to initiate Peripheral PN tonight as bridge to central access  Requires PN osmolarity < 900   Plan discussed with Primary team, Patient, Primary RN, VAT, Service Pharmacist, IV room   Daily BMP, Mag and Phos until stable on TPN  Renew PN daily   Per discussion with Endocrine will hold of on addition of insulin to PN tonight      Follow-Up Parameters:   Monitor daily while on TPN    Nutrition assessment completed by:  Gladys Damme MS, RD, LD, CNSC  [phone]    I was available via phone/pager. Lauretta Grill, MD

## 2021-09-26 NOTE — Unmapped (Signed)
Hospital Medicine Daily Progress Note    Assessment/Plan:    Principal Problem:    Hypoglycemia  Active Problems:    Ovarian cancer (CMS-HCC)    Type 2 diabetes mellitus (CMS-HCC)    Sick-euthyroid syndrome    Enterocutaneous fistula    Abscess    Poor nutrition    Anemia of chronic disease  Resolved Problems:    * No resolved hospital problems. *        Wound 08/19/21 Abdomen Mid open skin wound (Active)   Wound Image    08/27/21 1119   Dressing Status      Dry;Clean 09/12/21 1400   State of Healing Closed wound edges 09/11/21 0800   Wound Length (cm) 0.2 cm 08/27/21 1119   Wound Width (cm) 0.2 cm 08/27/21 1119   Wound Surface Area (cm^2) 0.04 cm^2 08/27/21 1119   Wound Bed Pink 08/26/21 1100   Odor None 09/10/21 2324   Peri-wound Assessment      Clean;Dry;Intact 09/12/21 1400   Exudate Type      Green;Tan 09/10/21 1400   Exudate Amnt      None 09/12/21 1400   Tunneling      No 09/12/21 1400   Undermining     No 09/10/21 1400   Treatments Cleansed/Irrigation 09/08/21 0600   Dressing Abdominal dressing (ABD);Dry gauze 09/12/21 1400           Colleen Russo is a 46 y.o. female who presented to [facility] with Hypoglycemia.    Severe hypoglycemia, symptomatic, improving She had measured blood glucose <55 with typical symptoms. Last reported insulin administration was 2/16. Initial hypoglycemia labs consistent with insulin dependent process either exogenous or endogenous. She required 24 hours off dextrose fluids to again drop below 55 and repeat hypoglycemia labs sent.   - Endocrine consulted, appreciate recs  - Restart dextrose containing IV fluids  - Stepdown status    Bacteremia She has a left upper extremity PICC for TPN and has peripheral blood cultures growing B cereus and Staph hominis. Repeat cultures growing Staph epi and Staph hominis. She is vancomycin for bacteremia and levofloxacin / metronidazole for enterocutaneous fistula with recent abscess. She also has small volume ascites with peritoneal thickening either consistent with peritonitis or carcinomatosis. Plan to obtain PIV access today and remove PICC. If cultures remain negative at 48 hours will replace PICC.  - Appreciate Infectious Disease assistance  - Repeat peripheral blood cultures 2/21    AKI Baseline Cr 0.4 and elevated to 0.6 and now 1.8. 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 Now monitoring vancomycin by levels.  - Daily Cr  - Dose medications for reduced renal function  - Additional 1 L NS today  - Continue MIVF    Malnutrition secondary to chronic bowel obstruction requiring TPN  - Holding TPN in setting of bacteremia and diagnostic evaluation of hypoglycemia  ??  Chronic small bowel obstruction??- TEG Tolerating clears and bites of food at home.  - Continue home scopolamine patch, using Reglan PRN  - Appreciate Gynecology Oncology and Surgical Oncology evaluations  - NPO except meds   ??  Enterocutaneous Fistula - Hx abscesses She has a of enterocutaneous fistula,??and abscesses requiring drainage. Most recently, abscess drained by VIR 2/7 with drain placement, initially on??Zosyn but transitioned to levofloxacin and metronidazole at discharge with expected course until 2/19. She is concerned that drain has had little output and she still feels an area of induration. CT ABD with reduced abdominal wall fluid  collection after drain placement. VIR recommends leaving drain in place until [facility] fistula has resolved as they expect the fluid collection will return. Ascitic fluid studies with few neutrophils reassuring against bacterial infection.  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 No more jitteriness or other low blood sugar symptoms. She is hopeful about reduced BG frequency.    Recent Results (from the past 24 hour(s))   Ascitic/Peritoneal Fluid Culture    Collection Time: 09/24/21  4:23 PM    Specimen: Abdomen; Fluid, Ascitic   Result Value Ref Range    Ascitic/Peritoneal Culture NO GROWTH TO DATE    Body fluid cell count    Collection Time: 09/24/21  4:25 PM   Result Value Ref Range    Fluid Type Fluid, Ascitic     Color, Fluid Amber     Appearance, Fluid Hazy     Nucleated Cells, Fluid 351 Undefined ul    RBC, Fluid 4,150 ul    Neutrophil %, Fluid 2.0 %    Lymphocytes %, Fluid 8.0 %    Mono/Macro % , Fluid 81.0 %    Other Cells %, Fluid 9.0 %    #Cells Counted BF Diff 100     Fluid Comments       Macrophages present.  Mesothelial cells present.  Mitotic figures present.   Glucose, Body Fluid    Collection Time: 09/24/21  4:25 PM   Result Value Ref Range    Glucose, Fluid 69 Undefined mg/dL    Glucose, Fluid Type Fluid, Ascitic    Protein, Body Fluid    Collection Time: 09/24/21  4:25 PM   Result Value Ref Range    Protein, Fluid 2.8 g/dL    Protein, Fluid Type Fluid, Ascitic    Albumin Level, Body Fluid    Collection Time: 09/24/21  4:25 PM   Result Value Ref Range    Albumin, Fluid 1.6 Undefined g/dL    Albumin, Fluid Type Fluid, Ascitic    POCT Glucose    Collection Time: 09/24/21  4:32 PM   Result Value Ref Range    Glucose, POC 76 70 - 179 mg/dL   POCT Glucose    Collection Time: 09/24/21  6:06 PM   Result Value Ref Range    Glucose, POC 73 70 - 179 mg/dL   POCT Glucose    Collection Time: 09/24/21  8:50 PM   Result Value Ref Range    Glucose, POC 97 70 - 179 mg/dL   POCT Glucose    Collection Time: 09/24/21 10:45 PM   Result Value Ref Range    Glucose, POC 80 70 - 179 mg/dL   POCT Glucose    Collection Time: 09/25/21 12:04 AM   Result Value Ref Range    Glucose, POC 72 70 - 179 mg/dL   POCT Glucose    Collection Time: 09/25/21  2:25 AM   Result Value Ref Range    Glucose, POC 84 70 - 179 mg/dL   POCT Glucose    Collection Time: 09/25/21  4:20 AM  Result Value Ref Range    Glucose, POC 83 70 - 179 mg/dL   POCT Glucose    Collection Time: 09/25/21  6:15 AM   Result Value Ref Range    Glucose, POC 80 70 - 179 mg/dL   Comprehensive Metabolic Panel    Collection Time: 09/25/21  6:24 AM   Result Value Ref Range    Sodium 142 135 - 145 mmol/L    Potassium 3.2 (L) 3.4 - 4.8 mmol/L    Chloride 108 (H) 98 - 107 mmol/L    CO2 26.0 20.0 - 31.0 mmol/L    Anion Gap 8 5 - 14 mmol/L    BUN 14 9 - 23 mg/dL    Creatinine 1.61 (H) 0.60 - 0.80 mg/dL    BUN/Creatinine Ratio 7     eGFR CKD-EPI (2021) Female 34 (L) >=60 mL/min/1.2m2    Glucose 77 70 - 179 mg/dL    Calcium 8.2 (L) 8.7 - 10.4 mg/dL    Albumin 2.1 (L) 3.4 - 5.0 g/dL    Total Protein 4.5 (L) 5.7 - 8.2 g/dL    Total Bilirubin 0.3 0.3 - 1.2 mg/dL    AST 30 <=09 U/L    ALT 7 (L) 10 - 49 U/L    Alkaline Phosphatase 53 46 - 116 U/L   Magnesium Level    Collection Time: 09/25/21  6:24 AM   Result Value Ref Range    Magnesium 1.5 (L) 1.6 - 2.6 mg/dL   Phosphorus Level    Collection Time: 09/25/21  6:24 AM   Result Value Ref Range    Phosphorus 4.6 2.4 - 5.1 mg/dL   CBC w/ Differential    Collection Time: 09/25/21  6:24 AM   Result Value Ref Range    WBC 6.3 3.6 - 11.2 10*9/L    RBC 2.60 (L) 3.95 - 5.13 10*12/L    HGB 7.2 (L) 11.3 - 14.9 g/dL    HCT 60.4 (L) 54.0 - 44.0 %    MCV 83.2 77.6 - 95.7 fL    MCH 27.8 25.9 - 32.4 pg    MCHC 33.4 32.0 - 36.0 g/dL    RDW 98.1 (H) 19.1 - 15.2 %    MPV 7.1 6.8 - 10.7 fL    Platelet 239 150 - 450 10*9/L    Neutrophils % 75.7 %    Lymphocytes % 13.4 %    Monocytes % 10.3 %    Eosinophils % 0.4 %    Basophils % 0.2 %    Absolute Neutrophils 4.7 1.8 - 7.8 10*9/L    Absolute Lymphocytes 0.8 (L) 1.1 - 3.6 10*9/L    Absolute Monocytes 0.6 0.3 - 0.8 10*9/L    Absolute Eosinophils 0.0 0.0 - 0.5 10*9/L Absolute Basophils 0.0 0.0 - 0.1 10*9/L    Anisocytosis Slight (A) Not Present   POCT Glucose    Collection Time: 09/25/21  8:12 AM   Result Value Ref Range    Glucose, POC 85 70 - 179 mg/dL   Vancomycin, Random    Collection Time: 09/25/21 11:26 AM   Result Value Ref Range    Vancomycin Rm 44.5 Undefined ug/mL     Labs/Studies:  Labs and Studies from the last 24hrs per EMR and Reviewed    Objective:  Temp:  [36.9 ??C (98.5 ??F)-37.2 ??C (99 ??F)] 37.2 ??C (98.9 ??F)  Heart Rate:  [97-107] 97  SpO2 Pulse:  [97-109] 102  Resp:  [12-20] 20  BP: (87-122)/(63-85) 122/73  SpO2:  [  98 %-100 %] 99 %    CONSTITUTIONAL: Alert, in no acute distress  HEENT: Normocephalic, atraumatic  NECK: Left neck with enteral feeding tube for decompression  CARDIOVASCULAR: Normal rate, rhythm sounds regular, no murmur  PULM: Clear to auscultation bilaterally, normal work of breathing  GASTROINTESTINAL: Soft, nondistended, area of subcutaneous firmness above her umbilicus, no active drainage or cellulitis  MSK: No obvious deformities, PICC left upper extremity without cellulitis  NEUROLOGIC: No focal deficits    I personally spent 60 minutes face-to-face and non-face-to-face in the care of this patient, which includes all pre, intra, and post visit time on the date of service.  All documented time was specific to the E/M visit and does not include any procedures that may have been performed.

## 2021-09-26 NOTE — Unmapped (Signed)
Endocrine Team Diabetes Follow Up Consult Note     Consult information:  Requesting Attending Physician : [name], *  Service Requesting Consult : Med [facility])  Primary Care Provider: Carver Fila, MD  Impression:  Colleen Russo is a 46 y.o. female admitted for Hypoglycemia. We have been consulted at the request of [name], * to evaluate Colleen Russo for hyperglycemia.       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   2. Nutrition: Complicating glycemic control. Increasing risk for both hypoglycemia and hyperglycemia.  3. Ovarian cancer . Complicating glycemic control and increasing risk for severe hyperglycemia and severe hypoglycemia      Studies reviewed 09/26/21:  Labs/Imaging Interpretation:   POCT glucose: sugars now > 80 and up into low 100's while on D10 and steroids  BMP: Cr has risen over past several days consistent with AKI   Notes reviewed: Primary team and nursing notes    Overall impression based on above reviews and history:  -no hypoglycemia and sugars now maintaining >80, up into 120's-130's  -of note, pt has had developing worsening Cr over last several days, which likely contributed to delayed insulin clearance and concomitant low C peptide   -currently on slow dexamethasone tape    -still on D5; can reassess insulin needs once TPN restarted and plan to dose insulin in bag    Recommendations:  -if recurrent sugars less than 60: collect serum glucose, Beta-hydroxybutyrate, insulin, and C peptide levels   -stop D10   -can restart D10 if glc < 70   -pt to start low concentration TPN tonight - no insulin to be included this bag    -240 g dextrose    -will follow along if insulin is indicated with plan to add to bag    -will keep in mind more relaxed glucose targets given risk for hypoglycemia   -if pt develops hyperglycemia with sugars > 250 at least twice, can start very relaxed correction   -lispro 1U if > 250    -lispro 2U if > 325    -lispro 3U if > 400   -defer steroid taper to GynOnc      Thank you for this consult. Discussed plan with primary team. We will continue to follow and make recommendations and place orders as appropriate.    Please page with questions or concerns: Endocrine fellow on call: [phone]    Vida Rigger. Claudell Kyle, MD  [facility] Endocrine Fellow, PGY-4  09/26/21      Subjective:  Interval hx: Today 09/26/21,sitting up in bed on cell phone. No recurrent lows in last 1 day.     Initial HPI:  Colleen Russo is a 46 y.o. 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 We have been consulted at the request of [name], * to evaluate Colleen Russo for hyperglycemia.     Patient was recently discharged from the hospital after spending about 4 weeks for SBO, EC fistula with abscess and nausea/vomiting. During this hospitalization, she developed TPN induced hyperglycemia, which was managed with correctional insulin and NPH. She was discharged in a stable state with TPN and recommendations of NPH 20 units at the start of the TPN. Per documentation, the TPN had no insulin in the bag but did have octreotide. Another thing to mentioned is that patient had been taking Dexamethasone prior to admission and while  admitted she was taking up to 8 mg of dexamethasone and was discharge on taper of 2 mg for 3 days, followed by 1 mg for 7 days.     After discharge patient had been having low blood sugar at the end of her TPN. This similar pattern was happening during hospitalization but her glucose decreased to normal range and never had hypoglycemia. Patient started reporting values < 70 mg/dL on 1/61, again at around 9-10 am after he TPN stopped. This episodes where accompanied with some lethargy which got better with candy. Interestly, her blood sugar would be < 70 and with smal piece of candy would jump into the 300s. Patient continued to have episodes an her NPH was discontinued ( after being reduced in half on 2/15) on 2/16. On 6:19 PM patient took 4 units for a BG 295 mg/dL and her blood sugar drop to 159 mg/dL, and that is the last time she had any form of insulin.     Per documentation, octreotide was being use to control fistula output and help with nausea and vomiting.     She received aggressive measures overnight 09/21/2021 including stress dose steroids with stabilization of blood glucoses. We recommended a modified fast by discontinuation of dextrose infusion. Stopped at 1113 on 09/21/2021 and at aproximately 1700 patient had POC of 50 with serum of <55. Labs where collected prior to dextrose admin. Patient had glucagon admin with monitor of her BG 15 and 30 min after. She was found to have approprietly low C peptide, high insulin levels,  low normal ketones, >30 points increase after glucagon admin.     Diabetes History:  Patient has a history of TPN induced hyperglycemia diagnosed 09/12/2021.  Diabetes is managed by: primary  Current home diabetes regimen: was taking 20 units of NPH at the start of her TPN but recently stopped due to recurrent hypoglycemia.   Current home blood glucose monitoring:  Glucometer   Hypoglycemia awareness: yes   Complications related to diabetes: none known    Current Diabetes Inpatient Regimen:  None     Current Nutrition:  Active Orders   Diet    NPO Sips with meds; Medically necessary         ROS: As per HPI.    ??? Cefepime  1 g Intravenous Q12H [facility]   ??? dexAMETHasone  1 mg Oral Daily    Followed by   ??? [START ON 09/29/2021] dexAMETHasone  0.5 mg Oral Daily   ??? docusate sodium  100 mg Oral Daily   ??? fentaNYL  1 patch Transdermal Q72H   ??? lidocaine  1 patch Transdermal Daily   ??? metroNIDAZOLE  500 mg Oral TID   ??? OLANZapine  2.5 mg Oral Daily   ??? OLANZapine  5 mg Oral Nightly   ??? pantoprazole  40 mg Oral Daily   ??? polyethylene glycol  17 g Oral Daily   ??? scopolamine  1 patch Topical Q72H   ??? Vancomycin - Pharmacy dosing by levels   Other Pharmacy dosing       Current Outpatient Medications   Medication Instructions   ??? acetaminophen (TYLENOL) 650 mg, Oral, Every 6 hours PRN   ??? bisacodyL (DULCOLAX) 10 mg, Rectal, Daily PRN   ??? calcium carbonate (TUMS) 200 mg calcium (500 mg) chewable tablet 200 mg of elem calcium, Oral, 3 times a day PRN   ??? cycloPHOSphamide (CYTOXAN) 50 mg, Oral, Daily (standard), Do not start until discussing with your oncologist   ???  docusate sodium (COLACE) 100 mg, Oral, Daily (standard)   ??? famotidine (PEPCID) 20 mg, Oral, Daily (standard)   ??? fentaNYL (DURAGESIC) 50 mcg/hr patch 1 patch, Transdermal, Every 72 hours   ??? GAS RELIEF 80 (SIMETHICONE) 80 mg, Oral, Every 6 hours PRN   ??? glucose 16 g, Oral, Every 10 min PRN   ??? hydrOXYzine (ATARAX) 25 mg, Oral, Every 6 hours PRN   ??? insulin lispro (HUMALOG) 100 unit/mL injection pen Inject 0-0.2 mL (0-20 Units total) under the skin every six (6) hours as needed. Administer number of insulin units based on your blood glucose: BG 160-200 = 1 unit BG 201-240 = 4 units BG 241-280 = 6 units BG 281-300 = 8 units BG > 300 = 10 units and notify provider   ??? insulin NPH (HUMULIN N NPH U-100 INSULIN) 100 unit/mL injection Inject 20 Units under the skin daily. Inject 1 hour prior to starting TPN infusion   ??? lidocaine 4 % patch Place 1 patch on the skin daily. Apply to affected area for 12 hours only each day (then remove patch).   ??? metoclopramide (REGLAN) 10 mg, Oral, Every 8 hours   ??? OLANZapine (ZYPREXA) 5 mg, Oral, Nightly   ??? OLANZapine (ZYPREXA) 2.5 mg, Oral, Daily (standard)   ??? omeprazole (PRILOSEC) 40 mg, Oral, Daily (standard)   ??? polyethylene glycol (GLYCOLAX) 17 g, Oral, Daily (standard)   ??? prochlorperazine (COMPAZINE) 10 mg, Oral, Every 8 hours PRN   ??? scopolamine (TRANSDERM-SCOP) 1 mg over 3 days 1 patch, Transdermal, Every 72 hours   ??? senna (SENOKOT) 8.6 mg tablet 2 tablets, Oral, 2 times a day PRN   ??? sodium chloride (NS) 0.9 % injection Use 5 mL by Intra-cannular route daily.           No past medical history on file.    Past Surgical History:   Procedure Laterality Date   ??? IR INSERT G-TUBE PERCUTANEOUS  09/06/2021    IR INSERT G-TUBE PERCUTANEOUS 09/06/2021 Gwenlyn Fudge, MD IMG VIR [facility]       No family history on file.    Social History     Tobacco Use   ??? Smoking status: Never   ??? Smokeless tobacco: Never       OBJECTIVE:  BP 141/103  - Pulse 123  - Temp 37.4 ??C (99.3 ??F) (Oral)  - Resp 14  - Ht 157.5 cm (5' 2)  - Wt 74.6 kg (164 lb 7.4 oz)  - SpO2 99%  - BMI 30.08 kg/m??   Wt Readings from Last 12 Encounters:   09/25/21 74.6 kg (164 lb 7.4 oz)   09/12/21 64.5 kg (142 lb 4.8 oz)     General: Chronically ill-appearing. No acute distress. Very pleasant. Body mass index is 30.08 kg/m??.   HEENT: Normocephalic, atraumatic. EOMI, sclera anicteric.  Neck: No stridor. Left neck with gastric access.   Respiratory: No respiratory distress.   Abdominal: Non-distended.    MSK: Normal station and gait; no edema.   Neuro: Awake, alert, and oriented.

## 2021-09-26 NOTE — Unmapped (Signed)
GYN ONC PROGRESS NOTE    Assessment and Plan:  Colleen Russo is a 46 y.o. 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 Blood cultures positive for Staph epi, S. Hominis, B. Cereus likely of intraabdominal, cutaneous source. Now s/p PICC removal. Remains with benign abdominal exam, no concern for ischemia in the setting of her chronic obstruction. S/p paracentesis 2/21 for increasing ascites and evaluation of infectious source with studies most consistent with malignancy, pending final culture.      Onc: Recurrent Clear Cell Carcinoma of the Ovary   - Primary Oncologist: [name]  - s/p XL??with??radical tumor debulking including??total hysterectomy,??bilateral salpingo-oophorectomy, infra-gastric??omentectomy, and??washings??on 07/21/2019  -??s/p 6 cycles carbo/taxol, completed 12/05/19  -CT 2/022 concerning for progression, started on carbo/gem 2/22-5/24/22.  -CT 12/2020 stable disease  - 01/23/21:??Diagnostic laparoscopy, exploratory laparotomy, unavoidable enterotomy with repair  -02/2021: noted to have EC fistuala,??OR on 7/2 for incision, exploration, and drainage of subcutaneous abscess??(6x8 cm) lateral to recent ex-lap incision. No definitive succus seen intra-op.??Treated conservatively with bowel rest and TPN for 1 month then had several I&D of recollected abscess at site of EC fistula with drain placed and left until clinical and imaging evidence of resolution????  -12/29-08/07/21 admission for partial SBO and abdominal wall fluid collection, biopsy of fluid collection consistent at first for upper GI or pancreatobiliary primary??  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  - Last CA-125:??180 on 09/06/2021    Refractory Hypoglycemia - H/o Type 2 Diabetes   - Discharged 2/9 on NPH 20 units daily to take 1 hr prior to TPN > attempted to decrease as an outpatient and ultimately discontinued given symptomatic lows, last took subq insulin 2/16   - Per Endocrine, unclear etiology  - Management per Primary Team, Endocrine following, appreciate recommendations   - Remains on Dextrose infusion   - Dexamethasone taper per Medicine   - Patient previously on steroids for nausea symptoms, no longer needed, okay to  discontinue as appropriate per primary team   - Last hypoglycemic 52 2/21, continues to improve    Enterocutaneous Fistula - Hx abscesses - Bacteremia, new abdominal ascites  - History of enterocutaneous fistula,??and abscesses requiring drainage.  - Most recently, abscess drain placement by VIR 2/7, initially on Zosyn > s/p Levofloxacin and Flagyl   - BCx positive   - 2/19 Staph epi, S. hominis, MRSA, Bacillus cereus   - ID consulted likely component of PICC colonization and intraabdominal source   - 2/21 Repeat BCx NGTD   - Hold Vanc (new AKI), stop Levo, Start Cefepime, cont on PO Flagyl  - DDx includes EC fistula, superficial abscess, recurrent intraabdominal abscess, malposition of JP drain, PICC colonization  - 2/19 repeat CTAP with redemonstration of ECF, decreased air and fluid collection along left lower anterior abdominal wall which is significantly decreased s/p VIR drain 2/7 making new abdominal abscess less likely, however bowel edema with possible ischemic component  - 2/21 VIR paracentesis 150cc amber fluid   - Culture prelim NGTD   - Cytology pending   - Protein 2.8, Glucose 69, Albumin 1.6   - Micro most consistent with intraabdominal and cutaneous etiology of bacteremia  - PICC removed 2/22 and replaced with PIV, if neg  - Abdominal exam remains benign, patient afebrile and clinically well appearing without concerns for peritonitis.  Small bowel obstruction - TEG  - On admit denies abdominal pain, nausea, vomiting on admit. Previously tolerating clears and small bites of food at home - 2/3 TEG placement  - Home reg: scopolamine patch, using Reglan and Compazine PRN  - 2/19 CTAP persistent small bowel obstruction, likely closed loop component with slight interval increase in enhancement of affected bowel mucosa  - 2/20 Colorectal surgery cs: agree ECF does not appear to be the source of bacteremia given drainage of previously existing fluid collections. Recommend VIR for singoram to evaluate drain at site of ECF  - Remains with a benign abdominal exam  ??  Sick Euthyroid Syndrome  - Last admit TSH 0.237, FT4 0.54  ????  Chronic Pain  - S/p Palliative Care consult, pain well controlled on 50 mcg fentanyl patches and Tylenol PRN  ??  Poor Nutrition  - S/p Nutrition consult 2/18  - TPN held in setting of bacteremia  - Currently NPO  ??  Anemia of Chronic Disease, stable  - Admit Hgb 7.9 stable from prior  ??  PPX: Lovenox  ??  Code Status: full code  ??  Disposition: floor    Plan discussed with Dr. Sherlean Foot. Attending Dr. Reyne Dumas immediately available.    Subjective:   NAEON. Feeling very motivated at her progress during the hospitalization. Denies any nausea or abdominal pain. Current symptoms are well controlled    Objective:  Temp:  [36.9 ??C (98.5 ??F)-37.2 ??C (98.9 ??F)] 37 ??C (98.6 ??F)  Heart Rate:  [97-115] 115  SpO2 Pulse:  [102-106] 102  Resp:  [18-21] 20  BP: (111-123)/(73-85) 118/83  SpO2:  [99 %-100 %] 100 %    -General:                    Well-appearing in NAD.  -CV:                             Regular rate and rhythm  -Chest:                        CTAB. Normal WOB.   -Abd:                           Non-tender, non-distended abdomen.  ECF dressing clean without drainage.  TEG with billious output overnight, JP drain sites without leakage, no output  -Extremities:              No clubbing, erythema, or edema.   -Psych:                       Appropriate.The patient is awake and alert.    Medications (scheduled)   ??? Cefepime  1 g Intravenous Q12H [facility]   ??? dexAMETHasone  1 mg Oral Daily    Followed by ??? [START ON 09/29/2021] dexAMETHasone  0.5 mg Oral Daily   ??? docusate sodium  100 mg Oral Daily   ??? fentaNYL  1 patch Transdermal Q72H   ??? lidocaine  1 patch Transdermal Daily   ??? metroNIDAZOLE  500 mg Oral TID   ??? OLANZapine  2.5 mg Oral Daily   ??? OLANZapine  5 mg Oral Nightly   ??? pantoprazole  40 mg Oral Daily   ??? polyethylene glycol  17 g Oral Daily   ??? scopolamine  1 patch Topical Q72H   ???  Vancomycin - Pharmacy dosing by levels   Other Pharmacy dosing       Medications (prn)  acetaminophen, bacitracin-polymyxin b, calcium carbonate, metoclopramide, naloxone, senna, simethicone    Labs:   Lab Results   Component Value Date    WBC 6.3 09/25/2021    HGB 7.2 (L) 09/25/2021    HCT 21.6 (L) 09/25/2021    PLT 239 09/25/2021       Lab Results   Component Value Date    NA 142 09/25/2021    K 3.2 (L) 09/25/2021    CL 108 (H) 09/25/2021    CO2 26.0 09/25/2021    BUN 14 09/25/2021    CREATININE 1.87 (H) 09/25/2021    GLU 77 09/25/2021    CALCIUM 8.2 (L) 09/25/2021    MG 1.5 (L) 09/25/2021    PHOS 4.6 09/25/2021       Lab Results   Component Value Date    BILITOT 0.3 09/25/2021    BILIDIR 0.10 09/11/2021    PROT 4.5 (L) 09/25/2021    ALBUMIN 2.1 (L) 09/25/2021    ALT 7 (L) 09/25/2021    AST 30 09/25/2021    ALKPHOS 53 09/25/2021       Lab Results   Component Value Date    INR 1.14 09/21/2021    APTT 30.7 09/21/2021     Scribe's Attestation: Lance Muss, MD obtained and performed the history, physical exam and medical decision making elements that were  entered into the chart. Documentation assistance was provided by me personally, a scribe. Signed by Freddie Apley Scribe, on September 26, 2021 at 4:50 AM.       ----------------------------------------------------------------------------------------------------------------------  September 26, 2021 7:57 AM. Documentation assistance provided by the Scribe. I was present during the time the encounter was recorded. The information recorded by the Scribe was done at my direction and has been reviewed and validated by me.  ----------------------------------------------------------------------------------------------------------------------

## 2021-09-26 NOTE — Unmapped (Signed)
Vancomycin Therapeutic Monitoring Pharmacy Note    Melisssa Russo is a 46 y.o. female continuing vancomycin. Date of therapy initiation: 09/22/21    Indication: Bacteremia/Sepsis    Prior Dosing Information: Current regimen - dosing by levels due to AKI      Goals:  Therapeutic Drug Levels  Vancomycin trough goal: 10-15 mg/L    Additional Clinical Monitoring/Outcomes  Renal function, volume status (intake and output)    Results: Vancomycin R = 34.6 mg/L    Wt Readings from Last 1 Encounters:   09/25/21 74.6 kg (164 lb 7.4 oz)     Creatinine   Date Value Ref Range Status   09/26/2021 2.59 (H) 0.60 - 0.80 mg/dL Final   16/05/9603 5.40 (H) 0.60 - 0.80 mg/dL Final   98/06/9146 8.29 0.60 - 0.80 mg/dL Final        Pharmacokinetic Considerations and Significant Drug Interactions:  ??? Adult (calculated on 09/24/21): Vd = 47.6 L, ke = 0.078 hr-1  ??? Concurrent nephrotoxic meds: not applicable    Assessment/Plan:  Recommendation(s)  ??? Hold further vancomycin dosing until level < 15 mg/L.  ??? Estimated trough on recommended regimen: Not applicable - dosing by level    Follow-up  ??? Level due: ~48 hrs  ??? A pharmacist will continue to monitor and order levels as appropriate    Please page service pharmacist with questions/clarifications.    Rubie Maid, PharmD

## 2021-09-26 NOTE — Unmapped (Signed)
Problem: Adult Inpatient Plan of Care  Goal: Plan of Care Review  Outcome: Progressing   Pt is A&O4, NSR, RA, VSS. JP drain x1 intact. Ambulate in the hallway.  Husband at bedside.Pt is resting at this time.   Goal: Patient-Specific Goal (Individualized)  Outcome: Progressing  Goal: Absence of Hospital-Acquired Illness or Injury  Outcome: Progressing  Intervention: Identify and Manage Fall Risk  Recent Flowsheet Documentation  Taken 09/25/2021 2000 by Burnard Leigh, RN  Safety Interventions:   fall reduction program maintained   lighting adjusted for tasks/safety   low bed  Intervention: Prevent and Manage VTE (Venous Thromboembolism) Risk  Recent Flowsheet Documentation  Taken 09/25/2021 2000 by Burnard Leigh, RN  Activity Management: ambulated outside room  Intervention: Prevent Infection  Recent Flowsheet Documentation  Taken 09/25/2021 2000 by Burnard Leigh, RN  Infection Prevention: hand hygiene promoted  Goal: Optimal Comfort and Wellbeing  Outcome: Progressing  Goal: Readiness for Transition of Care  Outcome: Progressing  Goal: Rounds/Family Conference  Outcome: Progressing     Problem: Self-Care Deficit  Goal: Improved Ability to Complete Activities of Daily Living  Outcome: Progressing     Problem: Skin Injury Risk Increased  Goal: Skin Health and Integrity  Outcome: Progressing  Intervention: Optimize Skin Protection  Recent Flowsheet Documentation  Taken 09/25/2021 2000 by Burnard Leigh, RN  Pressure Reduction Techniques: frequent weight shift encouraged  Pressure Reduction Devices: pressure-redistributing mattress utilized     Problem: Heart Failure Comorbidity  Goal: Maintenance of Heart Failure Symptom Control  Outcome: Progressing     Problem: Hypertension Comorbidity  Goal: Blood Pressure in Desired Range  Outcome: Progressing     Problem: Pain Chronic (Persistent) (Comorbidity Management)  Goal: Acceptable Pain Control and Functional Ability  Outcome: Progressing

## 2021-09-27 LAB — COMPREHENSIVE METABOLIC PANEL
ALBUMIN: 2.2 g/dL — ABNORMAL LOW (ref 3.4–5.0)
ALKALINE PHOSPHATASE: 51 U/L (ref 46–116)
ALT (SGPT): <7 U/L — ABNORMAL LOW (ref 10–49)
ANION GAP: 8 mmol/L (ref 5–14)
AST (SGOT): 18 U/L (ref <=34)
BILIRUBIN TOTAL: 0.2 mg/dL — ABNORMAL LOW (ref 0.3–1.2)
BLOOD UREA NITROGEN: 13 mg/dL (ref 9–23)
BUN / CREAT RATIO: 5
CALCIUM: 8.3 mg/dL — ABNORMAL LOW (ref 8.7–10.4)
CHLORIDE: 109 mmol/L — ABNORMAL HIGH (ref 98–107)
CO2: 25 mmol/L (ref 20.0–31.0)
CREATININE: 2.58 mg/dL — ABNORMAL HIGH
EGFR CKD-EPI (2021) FEMALE: 23 mL/min/{1.73_m2} — ABNORMAL LOW (ref >=60)
GLUCOSE RANDOM: 143 mg/dL (ref 70–179)
POTASSIUM: 3.3 mmol/L — ABNORMAL LOW (ref 3.4–4.8)
PROTEIN TOTAL: 4.8 g/dL — ABNORMAL LOW (ref 5.7–8.2)
SODIUM: 142 mmol/L (ref 135–145)

## 2021-09-27 LAB — CBC W/ AUTO DIFF
BASOPHILS ABSOLUTE COUNT: 0 10*9/L (ref 0.0–0.1)
BASOPHILS RELATIVE PERCENT: 0.1 %
EOSINOPHILS ABSOLUTE COUNT: 0 10*9/L (ref 0.0–0.5)
EOSINOPHILS RELATIVE PERCENT: 0.6 %
HEMATOCRIT: 20.4 % — ABNORMAL LOW (ref 34.0–44.0)
HEMOGLOBIN: 6.9 g/dL — ABNORMAL LOW (ref 11.3–14.9)
LYMPHOCYTES ABSOLUTE COUNT: 0.7 10*9/L — ABNORMAL LOW (ref 1.1–3.6)
LYMPHOCYTES RELATIVE PERCENT: 9.4 %
MEAN CORPUSCULAR HEMOGLOBIN CONC: 33.8 g/dL (ref 32.0–36.0)
MEAN CORPUSCULAR HEMOGLOBIN: 27.8 pg (ref 25.9–32.4)
MEAN CORPUSCULAR VOLUME: 82.2 fL (ref 77.6–95.7)
MEAN PLATELET VOLUME: 6.9 fL (ref 6.8–10.7)
MONOCYTES ABSOLUTE COUNT: 0.6 10*9/L (ref 0.3–0.8)
MONOCYTES RELATIVE PERCENT: 8.5 %
NEUTROPHILS ABSOLUTE COUNT: 5.8 10*9/L (ref 1.8–7.8)
NEUTROPHILS RELATIVE PERCENT: 81.4 %
PLATELET COUNT: 243 10*9/L (ref 150–450)
RED BLOOD CELL COUNT: 2.49 10*12/L — ABNORMAL LOW (ref 3.95–5.13)
RED CELL DISTRIBUTION WIDTH: 17 % — ABNORMAL HIGH (ref 12.2–15.2)
WBC ADJUSTED: 7.1 10*9/L (ref 3.6–11.2)

## 2021-09-27 LAB — HEMOGLOBIN: HEMOGLOBIN: 6.9 g/dL — ABNORMAL LOW (ref 11.3–14.9)

## 2021-09-27 LAB — MAGNESIUM: MAGNESIUM: 1.8 mg/dL (ref 1.6–2.6)

## 2021-09-27 LAB — PHOSPHORUS: PHOSPHORUS: 4.5 mg/dL (ref 2.4–5.1)

## 2021-09-27 LAB — GLUCAGON: GLUCAGON LEVEL: 41 pg/mL

## 2021-09-27 MED ADMIN — heparin lock flush (porcine) 100 unit/mL injection: INTRAVENOUS | @ 14:00:00 | Stop: 2021-09-27

## 2021-09-27 MED ADMIN — midazolam (VERSED) injection: INTRAVENOUS | @ 14:00:00 | Stop: 2021-09-27

## 2021-09-27 MED ADMIN — bisacodyL (DULCOLAX) suppository 10 mg: 10 mg | RECTAL | @ 22:00:00

## 2021-09-27 MED ADMIN — lidocaine (XYLOCAINE) 10 mg/mL (1 %) injection: SUBCUTANEOUS | @ 14:00:00 | Stop: 2021-09-27

## 2021-09-27 MED ADMIN — potassium chloride 20 mEq in 100 mL IVPB Premix: 20 meq | INTRAVENOUS | @ 19:00:00 | Stop: 2021-09-27

## 2021-09-27 MED ADMIN — sodium chloride (NS) 0.9 % infusion: INTRAVENOUS | @ 13:00:00 | Stop: 2021-09-27

## 2021-09-27 MED ADMIN — fentaNYL (DURAGESIC) 50 mcg/hr 1 patch: 1 | TRANSDERMAL | @ 07:00:00 | Stop: 2021-10-08

## 2021-09-27 MED ADMIN — dexAMETHasone (DECADRON) tablet 1 mg: 1 mg | ORAL | @ 16:00:00 | Stop: 2021-09-29

## 2021-09-27 MED ADMIN — fat emulsion 20 % with fish oil (SMOFLIPID) infusion 250 mL: 250 mL | INTRAVENOUS | @ 07:00:00 | Stop: 2021-09-27

## 2021-09-27 MED ADMIN — metroNIDAZOLE (FLAGYL) tablet 500 mg: 500 mg | ORAL | @ 03:00:00 | Stop: 2021-09-28

## 2021-09-27 MED ADMIN — Adult Parenteral Nutrition: INTRAVENOUS | @ 07:00:00 | Stop: 2021-09-28

## 2021-09-27 MED ADMIN — OLANZapine (ZyPREXA) tablet 5 mg: 5 mg | ORAL | @ 03:00:00

## 2021-09-27 MED ADMIN — potassium chloride 20 mEq in 100 mL IVPB Premix: 20 meq | INTRAVENOUS | @ 20:00:00 | Stop: 2021-09-27

## 2021-09-27 MED ADMIN — fentaNYL (PF) (SUBLIMAZE) injection: INTRAVENOUS | @ 14:00:00 | Stop: 2021-09-27

## 2021-09-27 MED ADMIN — pantoprazole (PROTONIX) EC tablet 40 mg: 40 mg | ORAL | @ 16:00:00

## 2021-09-27 MED ADMIN — metroNIDAZOLE (FLAGYL) IVPB 500 mg: 500 mg | INTRAVENOUS | @ 22:00:00 | Stop: 2021-10-02

## 2021-09-27 MED ADMIN — cefepime (MAXIPIME) 1 g in sodium chloride 0.9 % (NS) 100 mL IVPB-connector bag: 1 g | INTRAVENOUS | @ 03:00:00 | Stop: 2021-10-01

## 2021-09-27 MED ADMIN — fluconazole (DIFLUCAN) 2 mg/mL in sodium chloride 0.9% 200 mg: 200 mg | INTRAVENOUS | @ 23:00:00

## 2021-09-27 MED ADMIN — docusate sodium (COLACE) capsule 100 mg: 100 mg | ORAL | @ 16:00:00

## 2021-09-27 MED ADMIN — cefepime (MAXIPIME) 1 g in sodium chloride 0.9 % (NS) 100 mL IVPB-connector bag: 1 g | INTRAVENOUS | @ 16:00:00 | Stop: 2021-10-01

## 2021-09-27 NOTE — Unmapped (Signed)
Endocrine Team Diabetes Follow Up Consult Note     Consult information:  Requesting Attending Physician : [name], *  Service Requesting Consult : Med [facility])  Primary Care Provider: Carver Fila, MD  Impression:  Colleen Russo is a 46 y.o. female admitted for Hypoglycemia. We have been consulted at the request of [name], * to evaluate Colleen Russo for hyperglycemia.       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   2. Nutrition: Complicating glycemic control. Increasing risk for both hypoglycemia and hyperglycemia.  3. Ovarian cancer . Complicating glycemic control and increasing risk for severe hyperglycemia and severe hypoglycemia      Studies reviewed 09/27/21:  Labs/Imaging Interpretation:   POCT glucose: sugars within inpatient goals while on PPN   BMP: Cr has risen over past several days consistent with AKI but remains stable today    CBC: Anemia with Hb 6.9 today   Notes reviewed: Primary team and nursing notes    Overall impression based on above reviews and history:  -no hypoglycemia since 2/21  -sugars at inpatient goals on low concentration TPN today (240 g dextrose)    Recommendations:  -if recurrent sugars less than 60: collect serum glucose, Beta-hydroxybutyrate, insulin, and C peptide levels   -pt to start low concentration TPN tonight - no insulin to be included this bag   -recommend more relaxed glucose targets given risk for hypoglycemia    -if pt develops hyperglycemia with sugars > 250 at least twice, can start very relaxed correctional insulin:     -lispro 1U if > 250     -lispro 2U if > 325     -lispro 3U if > 400   -Thank you for the consult. Will sign off. Please page with questions. Please re-consult if pt experiences glucose <70 or recurrent sugars > 250, or need for insulin addition to TPN.     Thank you for this consult. Discussed plan with primary team. Will sign off.     Please page with questions or concerns: Endocrine fellow on call: [phone]    Vida Rigger. Claudell Kyle, MD  [facility] Endocrine Fellow, PGY-4  09/27/21      Subjective:  Interval hx: Today 09/27/21,sitting up in bed. Husband at bedside. She denies any more lows.     Initial HPI:  Colleen Russo is a 46 y.o. 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 We have been consulted at the request of [name], * to evaluate Colleen Russo for hyperglycemia.     Patient was recently discharged from the hospital after spending about 4 weeks for SBO, EC fistula with abscess and nausea/vomiting. During this hospitalization, she developed TPN induced hyperglycemia, which was managed with correctional insulin and NPH. She was discharged in a stable state with TPN and recommendations of NPH 20 units at the start of the TPN. Per documentation, the TPN had no insulin in the bag but did have octreotide. Another thing to mentioned is that patient had been taking Dexamethasone prior to admission and while admitted she was taking up to 8 mg of dexamethasone and was discharge on taper of 2 mg for 3 days, followed by 1 mg for 7 days.     After discharge patient had been having low blood sugar at the end of her TPN. This similar pattern was happening during hospitalization but her glucose  decreased to normal range and never had hypoglycemia. Patient started reporting values < 70 mg/dL on 1/61, again at around 9-10 am after he TPN stopped. This episodes where accompanied with some lethargy which got better with candy. Interestly, her blood sugar would be < 70 and with smal piece of candy would jump into the 300s. Patient continued to have episodes an her NPH was discontinued ( after being reduced in half on 2/15) on 2/16. On 6:19 PM patient took 4 units for a BG 295 mg/dL and her blood sugar drop to 159 mg/dL, and that is the last time she had any form of insulin. Per documentation, octreotide was being use to control fistula output and help with nausea and vomiting.     She received aggressive measures overnight 09/21/2021 including stress dose steroids with stabilization of blood glucoses. We recommended a modified fast by discontinuation of dextrose infusion. Stopped at 1113 on 09/21/2021 and at aproximately 1700 patient had POC of 50 with serum of <55. Labs where collected prior to dextrose admin. Patient had glucagon admin with monitor of her BG 15 and 30 min after. She was found to have approprietly low C peptide, high insulin levels,  low normal ketones, >30 points increase after glucagon admin.     Diabetes History:  Patient has a history of TPN induced hyperglycemia diagnosed 09/12/2021.  Diabetes is managed by: primary  Current home diabetes regimen: was taking 20 units of NPH at the start of her TPN but recently stopped due to recurrent hypoglycemia.   Current home blood glucose monitoring:  Glucometer   Hypoglycemia awareness: yes   Complications related to diabetes: none known    Current Diabetes Inpatient Regimen:  None     Current Nutrition:  Active Orders   Diet    NPO Sips with meds; Medically necessary         ROS: As per HPI.    ??? Cefepime  1 g Intravenous Q12H [facility]   ??? dexAMETHasone  1 mg Oral Daily    Followed by   ??? [START ON 09/29/2021] dexAMETHasone  0.5 mg Oral Daily   ??? docusate sodium  100 mg Oral Daily   ??? fentaNYL  1 patch Transdermal Q72H   ??? fluconazole (DIFLUCAN) INTRAVENOUS  200 mg Intravenous Q24H [facility]   ??? lidocaine  1 patch Transdermal Daily   ??? metroNIDAZOLE  500 mg Oral TID   ??? OLANZapine  2.5 mg Oral Daily   ??? OLANZapine  5 mg Oral Nightly   ??? pantoprazole  40 mg Oral Daily   ??? polyethylene glycol  17 g Oral Daily   ??? scopolamine  1 patch Topical Q72H   ??? Vancomycin - Pharmacy dosing by levels   Other Pharmacy dosing       Current Outpatient Medications   Medication Instructions   ??? acetaminophen (TYLENOL) 650 mg, Oral, Every 6 hours PRN   ??? bisacodyL (DULCOLAX) 10 mg, Rectal, Daily PRN   ??? calcium carbonate (TUMS) 200 mg calcium (500 mg) chewable tablet 200 mg of elem calcium, Oral, 3 times a day PRN   ??? cycloPHOSphamide (CYTOXAN) 50 mg, Oral, Daily (standard), Do not start until discussing with your oncologist   ??? docusate sodium (COLACE) 100 mg, Oral, Daily (standard)   ??? famotidine (PEPCID) 20 mg, Oral, Daily (standard)   ??? fentaNYL (DURAGESIC) 50 mcg/hr patch 1 patch, Transdermal, Every 72 hours   ??? GAS RELIEF 80 (SIMETHICONE) 80 mg, Oral, Every 6 hours PRN   ???  glucose 16 g, Oral, Every 10 min PRN   ??? insulin lispro (HUMALOG) 100 unit/mL injection pen Inject 0-0.2 mL (0-20 Units total) under the skin every six (6) hours as needed. Administer number of insulin units based on your blood glucose: BG 160-200 = 1 unit BG 201-240 = 4 units BG 241-280 = 6 units BG 281-300 = 8 units BG > 300 = 10 units and notify provider   ??? insulin NPH (HUMULIN N NPH U-100 INSULIN) 100 unit/mL injection Inject 20 Units under the skin daily. Inject 1 hour prior to starting TPN infusion   ??? lidocaine 4 % patch Place 1 patch on the skin daily. Apply to affected area for 12 hours only each day (then remove patch).   ??? metoclopramide (REGLAN) 10 mg, Oral, Every 8 hours   ??? OLANZapine (ZYPREXA) 5 mg, Oral, Nightly   ??? OLANZapine (ZYPREXA) 2.5 mg, Oral, Daily (standard)   ??? omeprazole (PRILOSEC) 40 mg, Oral, Daily (standard)   ??? polyethylene glycol (GLYCOLAX) 17 g, Oral, Daily (standard)   ??? prochlorperazine (COMPAZINE) 10 mg, Oral, Every 8 hours PRN   ??? scopolamine (TRANSDERM-SCOP) 1 mg over 3 days 1 patch, Transdermal, Every 72 hours   ??? senna (SENOKOT) 8.6 mg tablet 2 tablets, Oral, 2 times a day PRN   ??? sodium chloride (NS) 0.9 % injection Use 5 mL by Intra-cannular route daily.           No past medical history on file.    Past Surgical History:   Procedure Laterality Date   ??? IR INSERT G-TUBE PERCUTANEOUS  09/06/2021    IR INSERT G-TUBE PERCUTANEOUS 09/06/2021 Gwenlyn Fudge, MD IMG VIR H&V [facility]       No family history on file.    Social History     Tobacco Use   ??? Smoking status: Never   ??? Smokeless tobacco: Never       OBJECTIVE:  BP 119/83  - Pulse 88  - Temp 36.8 ??C (98.2 ??F) (Oral)  - Resp 20  - Ht 157.5 cm (5' 2)  - Wt 74.6 kg (164 lb 7.4 oz)  - SpO2 100%  - BMI 30.08 kg/m??   Wt Readings from Last 12 Encounters:   09/25/21 74.6 kg (164 lb 7.4 oz)   09/12/21 64.5 kg (142 lb 4.8 oz)     General: Chronically ill-appearing. No acute distress. Very pleasant. Body mass index is 30.08 kg/m??.   HEENT: Normocephalic, atraumatic. EOMI   Neck: No stridor. Left neck with gastric access.   Chest: Tunneled line present.   Respiratory: No conversational dyspnea.   Abdominal: Non-distended.    Neuro: Awake, alert, and oriented.

## 2021-09-27 NOTE — Unmapped (Signed)
Case Management Brief Assessment      2/23-Per CAPP rounds, Colleen Russo. Admitted for hypoglycemia. Current with [facility] [phone]). Colleen Russo. Has JP drain to abd. Wound. Cultures pending from PICC. Will have replaced once results are negative. Will need ROC orders placed if needing home infusion.    General:  Care Manager assessed the patient by : Medical record review, Discussion with Clinical Care team    Extended Emergency Contact Information  Primary Emergency Contact: Cattouse,Robert  Address: [address]           [place], [place] [address] [place]  Mobile Phone: [phone]  Relation: Spouse  Preferred language: ENGLISH  Interpreter needed? No    Discharge Plan:    Estimated Discharge Date: 09/30/2021    Additional Information:    HCDM (patient stated preference): Cattouse,Robert - Spouse - [phone]    Social Determinants of Health     Food Insecurity: Not on file   Tobacco Use: Low Risk    ??? Smoking Tobacco Use: Never   ??? Smokeless Tobacco Use: Never   ??? Passive Exposure: Not on file   Transportation Needs: Not on file   Alcohol Use: Not on file   Housing/Utilities: Not on file   Substance Use: Not on file   Financial Resource Strain: Not on file   Physical Activity: Not on file   Health Literacy: Not on file   Stress: Not on file   Intimate Partner Violence: Not on file   Depression: Not on file   Social Connections: Not on file       Predictive Model Details          33% (High)  Factor Value    Calculated 09/26/2021 16:03 23% Number of active Rx orders 47    [facility] Risk of Unplanned Readmission Model 8% Number of ED visits in last six months 2     7% Diagnosis of cancer present     7% Active antipsychotic Rx order present     7% ECG/EKG order present in last 6 months     7% Latest calcium low (8.2 mg/dL)     5% Encounter of ten days or longer in last year present     5% Imaging order present in last 6 months     4% Latest hemoglobin low (7.5 g/dL)     4% Phosphorous result present 4% Current length of stay 5.741 days     3% Number of hospitalizations in last year 1     3% Diagnosis of deficiency anemia present     3% Active corticosteroid Rx order present     3% Latest creatinine high (2.59 mg/dL)     3% Age 46     2% Charlson Comorbidity Index 3     1% Future appointment scheduled     1% Active ulcer medication Rx order present

## 2021-09-27 NOTE — Unmapped (Signed)
VENOUS ACCESS TEAM PROCEDURE    Order was placed for a PIV by Venous Access Team (VAT).  Patient was assessed at bedside for placement of a PIV. PPE were donned per protocol.  Access was obtained. Blood return noted.  Dressing intact and device well secured.  Flushed with normal saline.  See LDA for details.  Pt advised to inform RN of any s/s of discomfort at the PIV site.    Workup / Procedure Time:  30 minutes       Care RN was notified.       Thank you,     Lyn Records RN Venous Access Team

## 2021-09-27 NOTE — Unmapped (Signed)
[place] INTERVENTIONAL RADIOLOGY - Operative Note     VIR Post-Procedure Note    Procedure Name: Tunneled double lumen powerline placement into right internal jugular    Pre-Op Diagnosis: TPN dependence    Post-Op Diagnosis: Same as pre-operative diagnosis    VIR Providers    Operator: Rolin Barry    Time out: Prior to the procedure, a time out was performed with all team members present. During the time out, the patient, procedure and procedure site when applicable were verbally verified by the team members and Dr. Trude Mcburney    Description of procedure: Image guided placement of tunneled double lumen powerline into the right internal jugular veins. Each lumen locked with 2ml of 100 unit heparin    Sedation:Moderate sedation    Estimated Blood Loss: approximately <10 mL  Complications: None    See detailed procedure note with images in PACS (IMPAX).    The patient tolerated the procedure well without incident or complication and left the room in stable condition.    Lavanna Rog T Mayford Knife  09/27/2021 9:13 AM

## 2021-09-27 NOTE — Unmapped (Signed)
PPN started overnight without complications. Patient refusing second PIV at this time. Dressing to abdomen is clean, dry, and intact. No output from JP drain. Family at bedside.     Problem: Adult Inpatient Plan of Care  Goal: Plan of Care Review  Outcome: Ongoing - Unchanged  Goal: Patient-Specific Goal (Individualized)  Outcome: Ongoing - Unchanged  Goal: Absence of Hospital-Acquired Illness or Injury  Outcome: Ongoing - Unchanged  Intervention: Identify and Manage Fall Risk  Recent Flowsheet Documentation  Taken 09/26/2021 1930 by Eusebio Me, RN  Safety Interventions:   environmental modification   family at bedside   lighting adjusted for tasks/safety   low bed   nonskid shoes/slippers when out of bed   infection management  Intervention: Prevent and Manage VTE (Venous Thromboembolism) Risk  Recent Flowsheet Documentation  Taken 09/26/2021 2200 by Eusebio Me, RN  VTE Prevention/Management:   fluids promoted   intravenous hydration  Taken 09/26/2021 1930 by Eusebio Me, RN  Activity Management: activity adjusted per tolerance  Range of Motion: Bilateral Upper and Lower Extremities  Intervention: Prevent Infection  Recent Flowsheet Documentation  Taken 09/26/2021 1930 by Eusebio Me, RN  Infection Prevention:   cohorting utilized   environmental surveillance performed   equipment surfaces disinfected   hand hygiene promoted   personal protective equipment utilized   rest/sleep promoted   single patient room provided   visitors restricted/screened  Goal: Optimal Comfort and Wellbeing  Outcome: Ongoing - Unchanged  Goal: Readiness for Transition of Care  Outcome: Ongoing - Unchanged  Goal: Rounds/Family Conference  Outcome: Ongoing - Unchanged     Problem: Self-Care Deficit  Goal: Improved Ability to Complete Activities of Daily Living  Outcome: Ongoing - Unchanged     Problem: Skin Injury Risk Increased  Goal: Skin Health and Integrity  Outcome: Ongoing - Unchanged  Intervention: Optimize Skin Protection  Recent Flowsheet Documentation  Taken 09/26/2021 1930 by Eusebio Me, RN  Head of Bed [facility]) Positioning: HOB elevated     Problem: Heart Failure Comorbidity  Goal: Maintenance of Heart Failure Symptom Control  Outcome: Ongoing - Unchanged     Problem: Hypertension Comorbidity  Goal: Blood Pressure in Desired Range  Outcome: Ongoing - Unchanged     Problem: Pain Chronic (Persistent) (Comorbidity Management)  Goal: Acceptable Pain Control and Functional Ability  Outcome: Ongoing - Unchanged

## 2021-09-27 NOTE — Unmapped (Signed)
Hospital Medicine Daily Progress Note    Assessment/Plan:    Principal Problem:    Hypoglycemia  Active Problems:    Ovarian cancer (CMS-HCC)    Type 2 diabetes mellitus (CMS-HCC)    Sick-euthyroid syndrome    Enterocutaneous fistula    Abscess    Poor nutrition    Anemia of chronic disease  Resolved Problems:    * No resolved hospital problems. *        Wound 08/19/21 Abdomen Mid open skin wound (Active)   Wound Image    08/27/21 1119   Dressing Status      Dry;Clean 09/12/21 1400   State of Healing Closed wound edges 09/11/21 0800   Wound Length (cm) 0.2 cm 08/27/21 1119   Wound Width (cm) 0.2 cm 08/27/21 1119   Wound Surface Area (cm^2) 0.04 cm^2 08/27/21 1119   Wound Bed Pink 08/26/21 1100   Odor None 09/10/21 2324   Peri-wound Assessment      Clean;Dry;Intact 09/12/21 1400   Exudate Type      Green;Tan 09/10/21 1400   Exudate Amnt      None 09/12/21 1400   Tunneling      No 09/12/21 1400   Undermining     No 09/10/21 1400   Treatments Cleansed/Irrigation 09/08/21 0600   Dressing Abdominal dressing (ABD);Dry gauze 09/12/21 1400           Colleen Russo is a 46 y.o. female who presented to [facility] with Hypoglycemia.    Severe hypoglycemia, symptomatic, improving She had measured blood glucose <55 with typical symptoms. Last reported insulin administration was 2/16. Initial hypoglycemia labs consistent with insulin dependent process either exogenous or endogenous. She required 24 hours off dextrose fluids to again drop below 55 and repeat hypoglycemia labs sent. She has been stable on dextrose containing fluids while NPO because of her enterocutaneous fistula.  - Endocrine consulted, appreciate recs  - Continue dextrose containing fluids; starting PPN 2/23 while working to obtain central access    Bacteremia She has a left upper extremity PICC for TPN and has peripheral blood cultures growing B cereus and Staph hominis. Repeat cultures growing Staph epi and Staph hominis. She is vancomycin for bacteremia and levofloxacin / metronidazole for enterocutaneous fistula with recent abscess. She also has small volume ascites with peritoneal thickening either consistent with peritonitis or carcinomatosis. Blood cultures from 2/21 have remained negative. PICC removed 2/22.  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 Most recently, abscess drained by VIR 2/7 with drain placement, initially on??Zosyn but transitioned to levofloxacin and metronidazole at discharge with expected course until 2/19. She is concerned that drain has had little output and she still feels an area of induration. CT ABD with reduced abdominal wall fluid collection after drain placement. VIR recommends leaving drain in place until [facility] fistula has resolved as they expect the fluid collection will return. Ascitic fluid studies with few neutrophils reassuring against bacterial infection. She has continued to have some cutaneous drainage and swab from site has grown mixed anaerobes and Candida dubliniensis.  - Cefepime 1 gram every 12 and oral metronidazole  - Start fluconazole for Candida    AKI Baseline Cr 0.4 and elevated to 0.6, 1.8 and now 2.5. 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 Now monitoring vancomycin by levels. She is maintaining  adequate urine output but has noticed some lower extremity edema.  - Daily Cr  - Dose medications for reduced renal function  - Ensure adequate hydration  - Continue MIVF    Malnutrition secondary to chronic bowel obstruction requiring TPN  - She will need to continue TPN while on bowel rest  - VIR consulted for tunneled central line  - PPN until central access obtained for TPN  ??  Chronic small bowel obstruction??- TEG Tolerating clears and bites of food at home.  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 Discussed need for a new central line and that a PICC is not an option today because of her Cr but may not be the best line because we expect she will need TPN for at least 1-2 months.    Recent Results (from the past 24 hour(s))   POCT Glucose    Collection Time: 09/25/21  8:48 PM   Result Value Ref Range    Glucose, POC 125 70 - 179 mg/dL   POCT Glucose    Collection Time: 09/25/21 11:29 PM   Result Value Ref Range    Glucose, POC 122 70 - 179 mg/dL   POCT Glucose    Collection Time: 09/26/21  4:03 AM   Result Value Ref Range    Glucose, POC 131 70 - 179 mg/dL   Comprehensive Metabolic Panel    Collection Time: 09/26/21  5:16 AM   Result Value Ref Range    Sodium 141 135 - 145 mmol/L    Potassium 3.7 3.4 - 4.8 mmol/L    Chloride 111 (H) 98 - 107 mmol/L    CO2 24.0 20.0 - 31.0 mmol/L    Anion Gap 6 5 - 14 mmol/L    BUN 14 9 - 23 mg/dL    Creatinine 1.61 (H) 0.60 - 0.80 mg/dL    BUN/Creatinine Ratio 5     eGFR CKD-EPI (2021) Female 23 (L) >=60 mL/min/1.19m2    Glucose 127 70 - 179 mg/dL    Calcium 8.2 (L) 8.7 - 10.4 mg/dL    Albumin 2.3 (L) 3.4 - 5.0 g/dL    Total Protein 4.7 (L) 5.7 - 8.2 g/dL    Total Bilirubin 0.3 0.3 - 1.2 mg/dL    AST 30 <=09 U/L    ALT 8 (L) 10 - 49 U/L    Alkaline Phosphatase 56 46 - 116 U/L   Magnesium Level    Collection Time: 09/26/21  5:16 AM   Result Value Ref Range    Magnesium 1.9 1.6 - 2.6 mg/dL   Phosphorus Level Collection Time: 09/26/21  5:16 AM   Result Value Ref Range    Phosphorus 4.6 2.4 - 5.1 mg/dL   Vancomycin, Random    Collection Time: 09/26/21  5:16 AM   Result Value Ref Range    Vancomycin Rm 34.6 Undefined ug/mL   CBC w/ Differential    Collection Time: 09/26/21  5:16 AM  Result Value Ref Range    WBC 8.4 3.6 - 11.2 10*9/L    RBC 2.75 (L) 3.95 - 5.13 10*12/L    HGB 7.5 (L) 11.3 - 14.9 g/dL    HCT 16.1 (L) 09.6 - 44.0 %    MCV 83.0 77.6 - 95.7 fL    MCH 27.4 25.9 - 32.4 pg    MCHC 33.0 32.0 - 36.0 g/dL    RDW 04.5 (H) 40.9 - 15.2 %    MPV 7.1 6.8 - 10.7 fL    Platelet 261 150 - 450 10*9/L    Neutrophils % 83.5 %    Lymphocytes % 8.3 %    Monocytes % 7.9 %    Eosinophils % 0.2 %    Basophils % 0.1 %    Absolute Neutrophils 7.0 1.8 - 7.8 10*9/L    Absolute Lymphocytes 0.7 (L) 1.1 - 3.6 10*9/L    Absolute Monocytes 0.7 0.3 - 0.8 10*9/L    Absolute Eosinophils 0.0 0.0 - 0.5 10*9/L    Absolute Basophils 0.0 0.0 - 0.1 10*9/L    Anisocytosis Slight (A) Not Present   POCT Glucose    Collection Time: 09/26/21 12:13 PM   Result Value Ref Range    Glucose, POC 149 70 - 179 mg/dL   ECG 12 Lead    Collection Time: 09/26/21  4:13 PM   Result Value Ref Range    EKG Systolic BP  mmHg    EKG Diastolic BP  mmHg    EKG Ventricular Rate 89 BPM    EKG Atrial Rate 89 BPM    EKG P-R Interval 152 ms    EKG QRS Duration 78 ms    EKG Q-T Interval 378 ms    EKG QTC Calculation 459 ms    EKG Calculated P Axis 44 degrees    EKG Calculated R Axis 17 degrees    EKG Calculated T Axis 16 degrees    QTC Fredericia 430 ms     Labs/Studies:  Labs and Studies from the last 24hrs per EMR and Reviewed    Objective:  Temp:  [36.9 ??C (98.4 ??F)-37.4 ??C (99.3 ??F)] 36.9 ??C (98.4 ??F)  Heart Rate:  [99-123] 99  SpO2 Pulse:  [102-124] 102  Resp:  [14-21] 19  BP: (118-141)/(69-103) 137/94  SpO2:  [99 %-100 %] 100 %    CONSTITUTIONAL: Alert, in no acute distress  HEENT: Normocephalic, atraumatic  NECK: Left neck with enteral feeding tube for decompression  CARDIOVASCULAR: Normal rate, rhythm sounds regular, no murmur, trace lower extremity edema  PULM: Clear to auscultation bilaterally, normal work of breathing  GASTROINTESTINAL: Soft, nondistended, bandage in place above her umbilicus  MSK: No obvious deformities  NEUROLOGIC: No focal deficits    I personally spent 60 minutes face-to-face and non-face-to-face in the care of this patient, which includes all pre, intra, and post visit time on the date of service.  All documented time was specific to the E/M visit and does not include any procedures that may have been performed.

## 2021-09-27 NOTE — Unmapped (Signed)
GYN ONC PROGRESS NOTE    Assessment and Plan:  Colleen Russo is a 46 y.o. 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 Now s/p PICC removal. Remains with benign abdominal exam, no concern for ischemia in the setting of her chronic obstruction. Improving glycemic control now on PPN. Continues on IV antibiotics and fungal coverage for bacteremia likely intraabdominal/cutaneous etiology.    Onc: Recurrent Clear Cell Carcinoma of the Ovary   - Primary Oncologist: [name]  - s/p XL??with??radical tumor debulking including??total hysterectomy,??bilateral salpingo-oophorectomy, infra-gastric??omentectomy, and??washings??on 07/21/2019  -??s/p 6 cycles carbo/taxol, completed 12/05/19  -CT 2/022 concerning for progression, started on carbo/gem 2/22-5/24/22.  -CT 12/2020 stable disease  - 01/23/21:??Diagnostic laparoscopy, exploratory laparotomy, unavoidable enterotomy with repair  -02/2021: noted to have EC fistuala,??OR on 7/2 for incision, exploration, and drainage of subcutaneous abscess??(6x8 cm) lateral to recent ex-lap incision. No definitive succus seen intra-op.??Treated conservatively with bowel rest and TPN for 1 month then had several I&D of recollected abscess at site of EC fistula with drain placed and left until clinical and imaging evidence of resolution????  -12/29-08/07/21 admission for partial SBO and abdominal wall fluid collection, biopsy of fluid collection consistent at first for upper GI or pancreatobiliary primary??  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  - Last CA-125:??180 on 09/06/2021    Refractory Hypoglycemia - T2DM   - Discharged 2/9 on NPH 20 units daily to take 1 hr prior to TPN > attempted to decrease as an outpatient and ultimately discontinued given symptomatic lows, last took subq insulin 2/16   - Per Endocrine, unclear etiology  - Management per Primary Team, Endocrine following, appreciate recommendations   - No longer on dextrose IV after PN starts   - Dexamethasone taper per Medicine   - No indication for longterm steroids from GynOnc perspective   - Last hypoglycemic 52 2/21, off dextrose    - PPN until placement of central line    Enterocutaneous Fistula - Hx abscesses - Bacteremia, new abdominal ascites  - History of enterocutaneous fistula,??and abscesses requiring drainage.  - Most recently, abscess drain placement by VIR 2/7, initially on Zosyn > s/p Levofloxacin and Flagyl   - BCx positive   - 2/19 Staph epi, S. hominis, MRSA, Bacillus cereus   - ID consulted likely component of PICC colonization and intraabdominal source   - 2/21 Repeat BCx NGTD   - Hold Vanc (new AKI), stop Levo, start Fluconazole, start Cefepime, cont on PO Flagyl  - 2/19 Abdominal/cutaneous Cx; 1+ Candida dubliniensis> Fluconazole (2/23- )  - 2/19 repeat CTAP with redemonstration of ECF, decreased air and fluid collection along left lower anterior abdominal wall which is significantly decreased s/p VIR drain 2/7 making new abdominal abscess less likely, however bowel edema with possible ischemic component  - 2/21 VIR paracentesis 150cc amber fluid   - Culture prelim NGTD   - Cytology pending   - Protein 2.8, Glucose 69, Albumin 1.6   - Micro most consistent with intraabdominal and cutaneous etiology of bacteremia  - PICC removed 2/22 and replaced with PIV  - Abdominal exam remains benign, patient afebrile and clinically well appearing without concerns for peritonitis    Small bowel obstruction - TEG  - On admit denies abdominal pain, nausea, vomiting on admit. Previously tolerating clears and small bites of  food at home   - 2/3 TEG placement  - Home reg: scopolamine patch, using Reglan and Compazine PRN  - 2/19 CTAP persistent small bowel obstruction, likely closed loop component with slight interval increase in enhancement of affected bowel mucosa  - 2/20 Colorectal surgery cs: agree ECF does not appear to be the source of bacteremia given drainage of previously existing fluid collections  - Remains with a benign abdominal exam  - Will discuss no PO intake longterm to prevent recurrent abdominal fluid collection/abscess  ??  Sick Euthyroid Syndrome  - Last admit TSH 0.237, FT4 0.54  ????  Chronic Pain  - S/p Palliative Care consult, pain well controlled on 50 mcg fentanyl patches and Tylenol PRN  ??  Poor Nutrition  - S/p Nutrition consult 2/18  - Currently NPO  - PPN started 2/23- with plan to transition to TPN after replacement of central line    Anemia of Chronic Disease, stable  - Admit Hgb 7.9 stable from prior  ??  PPX: Lovenox  ??  Code Status: full code  ??  Disposition: floor    Plan discussed with Dr. Sherlean Foot. Attending Dr. Reyne Dumas immediately available.    Subjective:  NAEON. Excited to be floor status. Continues       Objective:  Temp:  [36.8 ??C (98.2 ??F)-37.2 ??C (99 ??F)] 36.8 ??C (98.2 ??F)  Heart Rate:  [85-102] 88  SpO2 Pulse:  [102] 102  Resp:  [15-20] 20  BP: (115-137)/(69-94) 119/83  SpO2:  [99 %-100 %] 100 %    -General:                    Well-appearing in NAD.  -CV:                             Regular rate and rhythm  -Chest:                    Normal WOB.   -Abd:                           Non-tender, non-distended abdomen.  ECF dressing with scan green drainage on dressing.  TEG with billious output overnight, JP drain sites without leakage, no output  -Extremities:              Non pitting edema bilaterally, symmetric and nontender  -Psych:                       Appropriate.The patient is awake and alert.    Medications (scheduled)   ??? Cefepime  1 g Intravenous Q12H [facility]   ??? dexAMETHasone  1 mg Oral Daily    Followed by   ??? [START ON 09/29/2021] dexAMETHasone  0.5 mg Oral Daily   ??? docusate sodium  100 mg Oral Daily   ??? fentaNYL  1 patch Transdermal Q72H   ??? fluconazole (DIFLUCAN) INTRAVENOUS  200 mg Intravenous Q24H [facility]   ??? lidocaine  1 patch Transdermal Daily   ??? metroNIDAZOLE  500 mg Oral TID   ??? OLANZapine  2.5 mg Oral Daily   ??? OLANZapine  5 mg Oral Nightly   ??? pantoprazole  40 mg Oral Daily   ??? polyethylene glycol  17 g Oral Daily   ??? scopolamine  1 patch Topical Q72H   ??? Vancomycin - Pharmacy dosing by levels  Other Pharmacy dosing       Medications (prn)  acetaminophen, calcium carbonate, metoclopramide, naloxone, ondansetron, senna, simethicone    Labs:   Lab Results   Component Value Date    WBC 8.4 09/26/2021    HGB 7.5 (L) 09/26/2021    HCT 22.8 (L) 09/26/2021    PLT 261 09/26/2021       Lab Results   Component Value Date    NA 141 09/26/2021    K 3.7 09/26/2021    CL 111 (H) 09/26/2021    CO2 24.0 09/26/2021    BUN 14 09/26/2021    CREATININE 2.59 (H) 09/26/2021    GLU 127 09/26/2021    CALCIUM 8.2 (L) 09/26/2021    MG 1.9 09/26/2021    PHOS 4.6 09/26/2021       Lab Results   Component Value Date    BILITOT 0.3 09/26/2021    BILIDIR 0.10 09/11/2021    PROT 4.7 (L) 09/26/2021    ALBUMIN 2.3 (L) 09/26/2021    ALT 8 (L) 09/26/2021    AST 30 09/26/2021    ALKPHOS 56 09/26/2021       Lab Results   Component Value Date    INR 1.14 09/21/2021    APTT 30.7 09/21/2021     Scribe's Attestation: Lance Muss, MD obtained and performed the history, physical exam and medical decision making elements that were  entered into the chart. Documentation assistance was provided by me personally, a scribe. Signed by Tinnie Gens Scribe, on September 27, 2021 at 6:19 AM.         ----------------------------------------------------------------------------------------------------------------------  September 27, 2021 8:51 AM. Documentation assistance provided by the Scribe. I was present during the time the encounter was recorded. The information recorded by the Scribe was done at my direction and has been reviewed and validated by me.  ----------------------------------------------------------------------------------------------------------------------

## 2021-09-27 NOTE — Unmapped (Signed)
Division of Infectious Diseases  General Inpatient Consultation Service     For any questions about this consult, page [phone] (Gen C Follow-up Pager).      Colleen Russo is being seen in consultation at the request of [name], MD for evaluation and management of bacteremia and abd wall abscess/enterocutaneous fistula.     PLAN FOR 09/27/2021    Diagnostic  ??? Follow pending blood cultures and peritoneal cultures  ??? Follow up abdominal swab culture from 2/19: ID has requested fluconazole susceptibility testing, due to return on or around 10/03/21    ??? Monitor for antimicrobial toxicity with the following:  ??? Daily CBCs w dif  for now (given leukopenia), bmps at least multiple times per week monitoring renal function  ??? EKG at start of therapy (ordered, QTc 459) and then after ~3d of therapy    Treatment  Polymicrobial bacteremia  ??? STOP vancomycin (dosed by pharmacy for goal trough 10-15) last vanco level 2/23 was 34.6 so likely still has systemic levels on 2/24 and will have completed 7d course     Abdominal wall abscess, enterocutaneous fistula  ??? Continue Fluconazole, which will need to be renally dose adjusted d/t findings of Candida dubliniensis on abdominal swab and persistent drainage. Would limit to 10-14d course.  ??? Continue Flagyl   ??? Continue Cefepime 1g Q12, this may need further dose adjustments pending CrCl, please renally adjust  ??? Duration of therapy = Recommend 4wks of therapy for her abdominal infections.  o start date = 09/10/21 when drain was placed, though therapy adjusted 2/22 from levo->cefepime.  o end date = 10/09/21    I discussed the plans for today with patient on 09/27/2021.    Our service will continue to follow intermittently, though will not see over the weekend. Please page ID if there are new concerns 2/25-2/26.     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     At this time patient has been on broad spectrum abx since 2/7 with drain placement. Repeat cultures 2/19 w/ candida -- though unknown if this is  contributing to current presentation. At this time would favor antibiotics through 3/8 as this would cover her polymicrobial bacteremia and give sufficient time for complicated intra-abdominal infection. Should she have clinical changes such as worsened discharge, would wonder if drainage represents EC fistula and contents, or ongoing infection not covered by above regimen.     September 27, 2021   Patient has: [x]  acute illness w/systemic sxs  [mod] [x]  illness posing risk to life or function  [high]   I reviewed:   (3+) [x]  primary team note []  consultant note(s) []  procedure/op note(s) [x]  micro result(s)    [x]  CBC results [x]  chemistry results []  radiology report(s) []  w/indep. historian   I independently visualized:   (any)   []  cxs/plates in lab []  plain film images []  CT images []  PET images    []   path slide(s) []  ECG tracing []  MRI images []  nuclear scan   I discussed: (any) []  micro and/or path w/lab personnel []  drug options and/or interactions w/ID pharmD    []  procedure/OR findings w/other MD(s) []  echo and/or imaging w/other MD(s)    []  mgm't w/attending(s) involved in case []  setting up home abx w/OPAT team   Mgm't requires: []  prescription drug(s)  [mod] [x]  intensive toxicity monitoring  [high]   Minutes: []  25-34      [x]  35-49      []  50+ []  30-74 providing critical care on the unit     Interval notes & result reports show:  Remains afebrile. Tunneled powerline placement today for TPN.     Patient reports: Somnolent today after line placement. Drainage seems to have slowed. No abd pain. No new rash. No diarrhea.     My interpretation of the data I reviewed is: WBC normal and stable. Cr 2.58 and stable from 2.59 09/26/21. Hypokalemia @ 3.3. Hg 6.9 down from 7.5    # Polymicrobial Bacteremia: Bacillus cereus, Staph hominis, Staph epi bacteremia   - acute, poses threat to life or bodily function  [high]  Staph hominis now in 3 separate peripheral blood cultures and 1 blood culture via PICC collected across ~13h on 2/18 and 2/19, clearly a true infection. B. cereus in 2/2 peripheral cultures from 2/18, did not grow on subsequent cx. Staph epi not in initial cultures but in 2 of 2 (peripheral, PICC) on 2/19, so these also likely represent true bacteremia. Both can be found in the GI tract and also (esp.the Staph) on skin and wounds, so an initial source of the enterocutaneous fistula or elsewhere in the GI tract is most likely, but suspect PICC was infected, so removed 09/25/21. We generally treat true bacteremia of each of these pathogens with at least 7 days of IV antibiotics, sometimes up to 14 days. If she were to remain bacteremic on subsequent cultures I would recommend a TTE to evaluate for endocarditis.   ??? Follow repeat blood cultures  to evaluate for clearance. Planned duration will be at least 7 days from first negative culture; tentatively 09/24/21-10/01/21    # Enterocutaneous fistula, abdominal wall abscess, possible peritonitis   - acute, poses threat to life or bodily function  [high]  S/p abscess drain placement by VIR 2/7 w/ repeat CT 2/19 and drain output suggestive that abscess has resolved or nearly resolved. Though, continues to have frank purulence on 09/26/21 exam-- which may represent residual abscess fluid or residual fistula. Perhaps most concerningly, has poor prognosis for bowel obstruction and for healing her enterocutaneous fistula, per gyn-onc, so will likely remain at high risk for persistent or new intraabdominal infections. Abdominal swab (taken by nursing on 2/19) does show anaerobes and candida dubliniensis. While this yeast is often a colonizing pathogen, in her context, this may be contributing to ongoing pathogenic infection. As such, would opt to cover this (routinely susceptible to fluconazole, but full susceptibilities will be run/made available around 10/03/21 per discussion with microbiology on 09/26/21).  - Peritoneal studies revealed monocytic predominance of cells (351 clumped nucleated cells, 2% neut) w/ SAAG 0.8 and protein 2.8 which can be consistent with underlying malignancy  -Cytology pending  -Tx: Vanco + cefepime + flagyl (anaerobes on abd swab 2/19) + fluconazole (added 2/23 due to ongoing drainage and c dubliniensis on swab 2/19). Tentatively would treat through 10/09/21    # AKI, 09/25/21  Noted 09/25/21, 0.62->1.87. Insults include possible  hypovolemia, Vancomycin and contrast  -Recommend workup per primary team    # Leukopenia, stable   - acute, undx'd new problem w/uncertain prognosis  [mod]  ??? 2.9 on 2/20, down from baseline of 6-7, resolved the next day.  Continue to trend CBCs- would do with dif daily for now, given rash.     # forearm rash, resolved   - acute, undx'd new problem w/uncertain prognosis  [mod]  ??? Fairly small area, not typical drug rash appearance, improved on 2/21. Monitor.    # Management of prescription antimicrobials needing intensive toxicity monitoring  Metronidazole can cause rashes, dysgeusia (altered taste), peripheral neuropathy, and/or myelosuppression.  Quinolones can cause rashes, N/V/D, CNS effects, MSK effects, arrhythmias and/or QTc prolongation, glucose abnormalities, and/or neuropathies.  Vancomycin can cause back pain, various dermatological effects, thrombocytopenia, neutropenia, and/or nephrotoxicity.   ??? See recommendations in blue box above.    # Disposition   -Likely home once improved. Likely OPAT candidate if prolonged IV antibiotics were to be required.         Antimicrobials & Other Medications   Vanc IV 2/18-  Flagyl 500 PO q  2/8-  Cefepime 2/22-  Fluconazole 2/23- current    Prior:  Levofloxacin 750 IV q 24 2/8 -     Immunomodulators   Dexamethasone taper 2/10-2/17, hydrocortisone/dexamethasone 2/18-present  - last chemo ~9 months ago    Current Medications as of 09/27/2021  Scheduled  PRN   Cefepime, 1 g, Q12H SCH  dexAMETHasone, 1 mg, Daily   Followed by  Melene Muller ON 09/29/2021] dexAMETHasone, 0.5 mg, Daily  docusate sodium, 100 mg, Daily  fentaNYL, 1 patch, Q72H  fluconazole (DIFLUCAN) INTRAVENOUS, 200 mg, Q24H SCH  lidocaine, 1 patch, Daily  metroNIDAZOLE, 500 mg, TID  OLANZapine, 2.5 mg, Daily  OLANZapine, 5 mg, Nightly  pantoprazole, 40 mg, Daily  polyethylene glycol, 17 g, Daily  scopolamine, 1 patch, Q72H  Vancomycin - Pharmacy dosing by levels, , Pharmacy dosing      acetaminophen, 650 mg, Q6H PRN  calcium carbonate, 200 mg of elem calcium, TID PRN  metoclopramide, 10 mg, TID PRN  naloxone, 0.1 mg, Q5 Min PRN  ondansetron, 4 mg, Q6H PRN  senna, 2 tablet, BID PRN  simethicone, 80 mg, Q6H PRN           Physical Exam     Temp:  [36.8 ??C (98.2 ??F)-36.9 ??C (98.4 ??F)] 36.8 ??C (98.2 ??F)  Heart Rate:  [85-106] 101  Resp:  [15-23] 18  BP: (99-137)/(72-94) 106/76  MAP (mmHg):  [95-106] 95  SpO2:  [100 %] 100 %    Actual body weight: 74.6 kg (164 lb 7.4 oz)  Ideal body weight: 50.1 kg (110 lb 7.2 oz)  Adjusted ideal body weight: 59.9 kg (132 lb 0.9 oz)     CONST: Vital signs above. Non-toxic appearance, no acute distress  EYES: anicteric sclerae. EOMI.   ENMT: Normal appearance of external nose and ears. Dry MM.   NECK: supple, feeding tube drain entering through L neck without surrounding erythema though there dried crusted serous drainage around tube, new bandaging 2/24. trachea midline.  CV: RRR, no m/r/g. No LE edema bilaterally.   PULM: CTAB, no wheezes or rales  ABD: soft,  ND, +BS, no tenderness to palpation. Bandaged. Small Green-yellow purulence seen just superior to fistula. Scarred skin and JP drain exiting mid abdomen with completely clear fluid in bulb.  GU: no urinary catheter in place  SKIN: 1-2 dozen pinpoint, pinkish, nonblanchable macules  on R forearm, slightly faded and improved from 2/22. No rashes noted elsewhere on clothed exam. Skin warm and dry.   NEURO: CN II-XII grossly inact. moving all 4 extremities  PSYCH: normal mood and affect. Fully alert and oriented.     Patient Lines/Drains/Airways Status     Active Active Lines, Drains, & Airways     Name Placement date Placement time Site Days    CVC Double Lumen 09/27/21 Tunneled Right Internal jugular 09/27/21  0900  Internal jugular  less than 1    Closed/Suction Drain Left LUQ Bulb 10 Fr. 09/10/21  1104  LUQ  16    Open Drain 1 Anterior;Left Neck 09/06/21  --  Neck  21    Peripheral IV 09/25/21 Anterior;Right Forearm 09/25/21  1835  Forearm  1                Data for ID Decision Making  ( IDGENCONMDM )       Micro & Serological Data   ( RSLTMICRO  /  Dorice Lamas  /  00CXSRC  /  00CXRES  /  00CXSUSC )  2/21 bld cx x2: NGTD  2/19 abdominal swab (taken by nursing at bedside): Per discussion and review of plates with microbiology on 2/23, the predominant pathogen is candida dubliniensis w/ 1+ anaerobes. No other gram negatives seen. This isolate was referred to susceptibility testing 09/26/21 w/ plan to result by 10/03/21  2/19 bld cx x2: Staph hominis in 2/2 AND Staph epi in 2/2 (Mec A detected)  2/18 bld cx: Bacillus cereus (2/2), Staph hominis (2/2)  2/17 ucx neg  2/7 abd aspirate: gram: 2+ GPR. Cx: 4+ mixed GP/GN orgs, incl. 3+ Citrobacter freundii (R unasyn, aztreo, cefazolin, ceftaz, ctx, I zosyn)), 2+ PsA (pan suscep), 4+ Strep anginosus  1/28 HIV neg    Recent Studies  ( RISRSLT )  2/19 CT abd pelvis w/ IV FAO:[id number]:  1. Persistent dilatation of small bowel loops with several transition points compatible with complex small bowel obstruction with likely closed loop component. There is slight interval increase in enhancement of the affected bowel mucosa. Recommend correlation with lactate and/or surgical consultation for ischemia.  ??  2. Scattered foci of gas in the anterior peritoneum. These do not definitively technique with the enterocutaneous fistula and may represent focal walled off perforation.  ??  3. New enhancement of peritoneal ascites concerning for peritonitis.  ??  4. Decreased size of gas and fluid containing collection in the left anterior abdominal wall arising from the known enterocutaneous fistula status post drain placement.    ADDENDUM:   (09/22/2021 8:26 PM)  On review, the following additional findings were noted:  ??  Limited exam in the absence of oral contrast.   ??  Redemonstration of a complex enterocutaneous fistula with associated soft tissue stranding at the level of the anterior midline abdomen. There has been interval placement of a pigtail drainage catheter within the previously-described air and fluid collection along the left lower anterior abdominal wall, which is significantly decreased in size when compared to prior exam.  ??  Numerous persistently dilated loops of small bowel are seen throughout the abdomen and pelvis, which demonstrate air-fluid levels and measure up to 3.7 cm in diameter. There is similar tethering of the small bowel along the anterior peritoneum with abrupt collapse at this level, thought likely to reflect a transition point (2:72). The distal small bowel appears mostly decompressed to the level of the cecum. These findings remain worrisome for  small bowel obstruction.   ??  Low attenuation abdominopelvic ascites (~18 HU) and diffuse peritoneal thickening/enhancement is similar to prior exam. While these findings, could potentially be seen in the setting of peritonitis, could also be seen with diffuse peritoneal disease spread and associated malignant ascites. Recommend clinical correlation.  ??  Elsewhere throughout the abdomen multiple loops of small demonstrate short segments of wall thickening, hyperenhancement, and luminal narrowing (2:63; 2:94), which may reflect sites of serosal involvement and associated stricturing        Initial Consult Documentation from September 23, 2021     Sources of information include: chart review, patient and treating providers.    History of Present Illness:     Ms. Holben was admitted to [facility] 2/17 with persistent hypoglycemia to 30s despite holding long acting insulin. This started a few days  after discharge home 2/10 from her last admission. Reports she experienced periods of lightheadness, decreased peripheral vision and is told she had slurred speech during these episodes, during which her glucose was found to be low. She would also often feel hot or chilled when her sugar was low, not oat other times. No rigors.     Reports full adherence to levo/flagyl she was taking  for abdominal abscess s/p VIR drainage 2/7. She reports that either as soon as or within a day or two after going home the output into her abdominal/fistula site JP drain stopped, with no drainage (other than the fluid she flushes in) since. She has had very scant greenish drainage from the fistula/abscess exit site area- enough to stain overlying dressing but not enough to soak through or really be visible on the skin. Reports her abdominal pain is unchanged.     Blood cultures were drawn, which grew Bacillus and a coag negative staph in both peripheral blood cultures. She has remained afebrile without infectious symptoms. After initial admission to the MICU, transferred to the floor.     When blood cultures grew, vancomycin was added.     Today, she noticed a R forearm rash with a few dozen small, pinpoint red macules. It is not itchy or painful. No rash or lesions she has noticed elsewhere.     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  -CT 12/2020 stable disease  - 01/23/21:??Diagnostic laparoscopy, exploratory laparotomy, unavoidable enterotomy with repair  -02/2021: noted to have EC fistuala,??OR on 7/2 for incision, exploration, and drainage of subcutaneous abscess??(6x8 cm) lateral to recent ex-lap incision. No definitive succus seen intra-op.??Treated conservatively with bowel rest and TPN for 1 month then had several I&D of recollected abscess at site of EC fistula with drain placed and left until clinical and imaging evidence of resolution????  -12/29-08/07/21 admission for partial SBO and abdominal wall fluid collection, biopsy of fluid collection consistent at first for upper GI or pancreatobiliary primary??  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 09/2021  -IDDM2  -poor nutrition  -anemia of chronic dz  -chronic pain on fentanyl patches    Meds and Allergies  Patient has a current medication list which includes the following prescription(s): acetaminophen, bisacodyl, calcium carbonate, cyclophosphamide, docusate sodium, famotidine, fentanyl, glucose, insulin lispro, insulin nph, lidocaine,  metoclopramide, olanzapine, olanzapine, omeprazole, polyethylene glycol, prochlorperazine, scopolamine, senna, simethicone, sodium chloride, and [DISCONTINUED] fentanyl, and the following Facility-Administered Medications: acetaminophen, Adult Parenteral Nutrition **AND** fat emulsion 20 % with fish oil, calcium carbonate, cefepime (MAXIPIME) 1 g in sodium chloride 0.9 % (NS) 100 mL IVPB-connector bag, dexamethasone **FOLLOWED BY** [START ON 09/29/2021] dexamethasone, docusate sodium, fentanyl, fluconazole, lidocaine, metoclopramide, metronidazole, naloxone, olanzapine, olanzapine, ondansetron, pantoprazole, polyethylene glycol, scopolamine, senna, simethicone, vancomycin - pharmacy dosing by levels.    Allergies: Carboplatin and Ketamine       Social History  Patient  reports that she has never smoked. She has never used smokeless tobacco. Lives at home.

## 2021-09-28 LAB — COMPREHENSIVE METABOLIC PANEL
ALBUMIN: 2.6 g/dL — ABNORMAL LOW (ref 3.4–5.0)
ALKALINE PHOSPHATASE: 61 U/L (ref 46–116)
ALT (SGPT): 7 U/L — ABNORMAL LOW (ref 10–49)
ANION GAP: 9 mmol/L (ref 5–14)
AST (SGOT): 20 U/L (ref <=34)
BILIRUBIN TOTAL: 0.3 mg/dL (ref 0.3–1.2)
BLOOD UREA NITROGEN: 16 mg/dL (ref 9–23)
BUN / CREAT RATIO: 7
CALCIUM: 8.8 mg/dL (ref 8.7–10.4)
CHLORIDE: 105 mmol/L (ref 98–107)
CO2: 26 mmol/L (ref 20.0–31.0)
CREATININE: 2.2 mg/dL — ABNORMAL HIGH
EGFR CKD-EPI (2021) FEMALE: 28 mL/min/{1.73_m2} — ABNORMAL LOW (ref >=60)
GLUCOSE RANDOM: 123 mg/dL (ref 70–179)
POTASSIUM: 3.6 mmol/L (ref 3.4–4.8)
PROTEIN TOTAL: 5.6 g/dL — ABNORMAL LOW (ref 5.7–8.2)
SODIUM: 140 mmol/L (ref 135–145)

## 2021-09-28 LAB — CBC W/ AUTO DIFF
BASOPHILS ABSOLUTE COUNT: 0 10*9/L (ref 0.0–0.1)
BASOPHILS RELATIVE PERCENT: 0.4 %
EOSINOPHILS ABSOLUTE COUNT: 0.1 10*9/L (ref 0.0–0.5)
EOSINOPHILS RELATIVE PERCENT: 0.6 %
HEMATOCRIT: 29.4 % — ABNORMAL LOW (ref 34.0–44.0)
HEMOGLOBIN: 10.1 g/dL — ABNORMAL LOW (ref 11.3–14.9)
LYMPHOCYTES ABSOLUTE COUNT: 0.9 10*9/L — ABNORMAL LOW (ref 1.1–3.6)
LYMPHOCYTES RELATIVE PERCENT: 10.2 %
MEAN CORPUSCULAR HEMOGLOBIN CONC: 34.3 g/dL (ref 32.0–36.0)
MEAN CORPUSCULAR HEMOGLOBIN: 27.9 pg (ref 25.9–32.4)
MEAN CORPUSCULAR VOLUME: 81.3 fL (ref 77.6–95.7)
MEAN PLATELET VOLUME: 7.2 fL (ref 6.8–10.7)
MONOCYTES ABSOLUTE COUNT: 0.7 10*9/L (ref 0.3–0.8)
MONOCYTES RELATIVE PERCENT: 8.2 %
NEUTROPHILS ABSOLUTE COUNT: 7 10*9/L (ref 1.8–7.8)
NEUTROPHILS RELATIVE PERCENT: 80.6 %
PLATELET COUNT: 258 10*9/L (ref 150–450)
RED BLOOD CELL COUNT: 3.61 10*12/L — ABNORMAL LOW (ref 3.95–5.13)
RED CELL DISTRIBUTION WIDTH: 16 % — ABNORMAL HIGH (ref 12.2–15.2)
WBC ADJUSTED: 8.6 10*9/L (ref 3.6–11.2)

## 2021-09-28 LAB — PHOSPHORUS: PHOSPHORUS: 4.2 mg/dL (ref 2.4–5.1)

## 2021-09-28 LAB — MAGNESIUM: MAGNESIUM: 1.8 mg/dL (ref 1.6–2.6)

## 2021-09-28 LAB — VANCOMYCIN, RANDOM: VANCOMYCIN RANDOM: 17.3 ug/mL

## 2021-09-28 MED ADMIN — Adult Parenteral Nutrition: INTRAVENOUS | @ 06:00:00 | Stop: 2021-09-29

## 2021-09-28 MED ADMIN — OLANZapine (ZyPREXA) tablet 5 mg: 5 mg | ORAL | @ 01:00:00

## 2021-09-28 MED ADMIN — cefepime (MAXIPIME) 1 g in sodium chloride 0.9 % (NS) 100 mL IVPB-connector bag: 1 g | INTRAVENOUS | @ 01:00:00 | Stop: 2021-10-01

## 2021-09-28 MED ADMIN — sodium chloride (NS) 0.9 % infusion: INTRAVENOUS | @ 04:00:00

## 2021-09-28 MED ADMIN — senna (SENOKOT) tablet 2 tablet: 2 | ORAL | @ 01:00:00

## 2021-09-28 MED ADMIN — pantoprazole (PROTONIX) EC tablet 40 mg: 40 mg | ORAL | @ 14:00:00

## 2021-09-28 MED ADMIN — fluconazole (DIFLUCAN) 2 mg/mL in sodium chloride 0.9% 200 mg: 200 mg | INTRAVENOUS | @ 22:00:00

## 2021-09-28 MED ADMIN — dexAMETHasone (DECADRON) tablet 1 mg: 1 mg | ORAL | @ 14:00:00 | Stop: 2021-09-28

## 2021-09-28 MED ADMIN — metroNIDAZOLE (FLAGYL) IVPB 500 mg: 500 mg | INTRAVENOUS | @ 20:00:00 | Stop: 2021-10-02

## 2021-09-28 MED ADMIN — cefepime (MAXIPIME) 1 g in sodium chloride 0.9 % (NS) 100 mL IVPB-connector bag: 1 g | INTRAVENOUS | @ 14:00:00 | Stop: 2021-10-01

## 2021-09-28 MED ADMIN — fat emulsion 20 % with fish oil (SMOFLIPID) infusion 250 mL: 250 mL | INTRAVENOUS | @ 06:00:00 | Stop: 2021-09-28

## 2021-09-28 MED ADMIN — metroNIDAZOLE (FLAGYL) IVPB 500 mg: 500 mg | INTRAVENOUS | @ 02:00:00 | Stop: 2021-10-02

## 2021-09-28 MED ADMIN — metroNIDAZOLE (FLAGYL) IVPB 500 mg: 500 mg | INTRAVENOUS | @ 10:00:00 | Stop: 2021-10-02

## 2021-09-28 NOTE — Unmapped (Signed)
Hospital Medicine Daily Progress Note    Assessment/Plan:    Principal Problem:    Hypoglycemia  Active Problems:    Ovarian cancer (CMS-HCC)    Type 2 diabetes mellitus (CMS-HCC)    Sick-euthyroid syndrome    Enterocutaneous fistula    Abscess    Poor nutrition    Anemia of chronic disease  Resolved Problems:    * No resolved hospital problems. *                Colleen Russo is a 46 y.o. 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 with Hypoglycemia. Hospital course has been complicated by AKI and polymicrobial bacteremia.     Severe hypoglycemia, resolved: presented with blood glucose <55 with hypoglycemic symptoms in setting of home insulin use while on TPN. Last reported insulin administration was 2/16. Initial hypoglycemia labs consistent with insulin-dependent process, either exogenous or endogenous. Hypoglycemia has resolved with discontinuation of insulin and with dextrose-containing fluids and parenteral nutrition. Suspect the hyperglycemia that prompted insulin initiation during last admission was steroid-induced. With steroid taper, glucoses currently at goal; not hypoglycemic nor >250.  - Appreciate Endocrinology recommendations  - Continue to hold insulin unless glucose >250, at which point would start very relaxed correctional insulin  - If she does require insulin on discharge, may benefit from insulin being added to TPN given concern for patient mixing up insulins at home contributing to hypoglycemia    Polymicrobial bacteremia, resolved: Peripheral BCx 2/18 grew B cereus and Staph hominis. Repeat peripheral and central line BCx 2/19 grew Staph epi and Staph hominis. Repeat BCx 2/21 no growth. Bacteremia most likely due to translocation from [facility] fistula vs central line-associated (had PICC for TPN at time of bacteremia). Now s/p PICC removal 2/22 and new central line placement 2/24. Treated with IV vancomycin, complicated by supratherapeutic levels and AKI. Vancomycin has been held since 2/21 due to elevated levels but remains therapeutic, with level 17 today 2/25. Per ID, CoNS can be treated with 7 days of IV antibiotics.   - Appreciate ID recommendations  - s/p vancomycin x 7 days (2/18-2/24/23)    Enterocutaneous fistula - Abdominal wall abscess - Hx intra-abdominal abscesses: Most recently s/p abscess drain placement by [facility] 2/7; was treated with Zosyn --> levofloxacin/Flagyl on discharge through 09/22/21. CT A/P on admission with reduced abdominal wall fluid collection after drain placement. [facility]IR recommended leaving drain in place until [facility] fistula has resolved, despite low drain output, as they expect the fluid collection will return. Ascitic fluid studies with few neutrophils reassuring against bacterial peritonitis, and peritoneal culture no growth. She has continued to have some cutaneous drainage, and swab from site has grown mixed anaerobes and Candida dubliniensis.  - Continue cefepime IV and metronidazole PO, anticipate 4 week course from drain placement (2/7 - 10/09/21)   - Continue fluconazole for Candida, anticipate 10-14 day course (2/23 - 10/09/21)    AKI, not POA, improving: Baseline Cr 0.4; Cr increased 0.6>1.87>2.59 by 2/23. Suspect due to vancomycin nephrotoxicity (level peaked at 44 on 2/22) and/or contrast-induced nephropathy (received IV contrast 09/22/21). Has not been oliguric but has complained of peripheral edema. Creatinine improving to 2.2 today and vancomycin level downtrending.   - Daily BMP   - Avoid nephrotoxins and renally dose meds    Malnutrition secondary to chronic bowel obstruction requiring TPN: will need to continue  TPN while on bowel rest for management of chronic SBO and EC fistula. PICC line removed 2/22 and new tunneled line placed 09/27/21 by [facility]. TPN restarted 2/24 and patient currently tolerating well without electrolyte abnormalities on labs.   - Continue TPN  ??  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: Primary Oncologist: [name]  - Appreciate Gyn/Onc recommendations  - Continue dexamethasone taper; no long-term indication per Gyn Onc  - Outpatient follow-up with Gyn Onc    Chronic??Pain: currently  well-controlled  - Continue fentanyl 50 mcg/hr transdermal, lidocaine patch, and??Tylenol PRN  - Start dronabinol 2.5 mg at bedtime per patient request (half prior dose in 08/2021; had been discontinued due to altered mental status)    FEN: NPO for bowel rest (sips/chips for comfort); G-tube venting; TPN  VTE ppx: Lovenox  Code Status: Full  Disposition: anticipate discharge home Monday 2/27 at earliest with home infusion for TPN and antibiotics    I personally spent 50 minutes face-to-face and non-face-to-face in the care of this patient, which includes all pre, intra, and post visit time on the date of service.  All documented time was specific to the E/M visit and does not include any procedures that may have been performed.  ___________________________________________________________________    Subjective:  No acute events overnight. Doing well overall and tolerating TPN. Denies nausea. Had two BMs today (mostly sediment). Pain is relatively well controlled but she wants to try adding dronabinol to her med regimen for appetite stimulation, nausea control, and pain control (previously took per [facility] recs in 08/2021 until it was stopped due to multifactorial encephalopathy). She wants to wean down her Zyprexa because it makes her drowsy. She is in good spirits.     Labs/Studies:  Labs per EMR and Reviewed (last 24hrs)    Objective:  Temp:  [36.5 ??C (97.7 ??F)-37.2 ??C (99 ??F)] 36.7 ??C (98.1 ??F)  Heart Rate:  [79-90] 79  Resp:  [14-20] 16  BP: (110-129)/(82-94) 114/87  SpO2:  [100 %] 100 %    Gen: Non-toxic-appearing, sitting up in chair at bedside  Eyes: EOMI, conjunctivae clear  ENT: MMM. TEG in place draining small amount of gastric contents. Powerline in place in R anterior chest, insertion site c/d/i, sterile dressing intact  CV: RRR, nl S1 S2, no m/r/g  Pulm: CTAB, nl WOB on RA, no wheezes or crackles  Abd: Soft, NTND. Firm induration superior to umbilicus with small EC fistula present, no active oozing or drainage, minimal clear output in JP drain.   Ext: WWP, no pitting edema or cyanosis  Skin: No rashes or lesions  Neuro: A&O x 3, no focal deficits  Psych: Appropriate affect

## 2021-09-28 NOTE — Unmapped (Addendum)
Hospital Medicine Daily Progress Note    Assessment/Plan:    Principal Problem:    Hypoglycemia  Active Problems:    Ovarian cancer (CMS-HCC)    Type 2 diabetes mellitus (CMS-HCC)    Sick-euthyroid syndrome    Enterocutaneous fistula    Abscess    Poor nutrition    Anemia of chronic disease  Resolved Problems:    * No resolved hospital problems. *        Wound 08/19/21 Abdomen Mid open skin wound (Active)   Wound Image    08/27/21 1119   Dressing Status      Dry;Clean 09/12/21 1400   State of Healing Closed wound edges 09/11/21 0800   Wound Length (cm) 0.2 cm 08/27/21 1119   Wound Width (cm) 0.2 cm 08/27/21 1119   Wound Surface Area (cm^2) 0.04 cm^2 08/27/21 1119   Wound Bed Pink 08/26/21 1100   Odor None 09/10/21 2324   Peri-wound Assessment      Clean;Dry;Intact 09/12/21 1400   Exudate Type      Green;Tan 09/10/21 1400   Exudate Amnt      None 09/12/21 1400   Tunneling      No 09/12/21 1400   Undermining     No 09/10/21 1400   Treatments Cleansed/Irrigation 09/08/21 0600   Dressing Abdominal dressing (ABD);Dry gauze 09/12/21 1400           Colleen Russo is a 46 y.o. female who presented to [facility] with Hypoglycemia.    Severe hypoglycemia, symptomatic, improving   Diabetes  She had measured blood glucose <55 with typical symptoms. Last reported insulin administration was 2/16. Initial hypoglycemia labs consistent with insulin dependent process either exogenous or endogenous. She required 24 hours off dextrose fluids to again drop below 55 and repeat hypoglycemia labs sent. She has been stable on dextrose containing fluids while NPO because of her enterocutaneous fistula.  - Endocrine consulted, appreciate recs  - Continue dextrose containing fluids; starting TPN 2/24  - If BG >250 start corrective insulin (see endo consult note from 2/24)    Bacteremia She has a left upper extremity PICC for TPN and has peripheral blood cultures growing B cereus and Staph hominis. Repeat cultures growing Staph epi and Staph hominis. She is vancomycin for bacteremia and levofloxacin / metronidazole for enterocutaneous fistula with recent abscess. She also has small volume ascites with peritoneal thickening either consistent with peritonitis or carcinomatosis. Blood cultures from 2/21 have remained negative. PICC removed 2/22.  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 Most recently, abscess drained by [facility] 2/7 with drain placement, initially on??Zosyn but transitioned to levofloxacin and metronidazole at discharge with expected course until 2/19. She is concerned that drain has had little output and she still feels an area of induration. CT ABD with reduced abdominal wall fluid collection after drain placement. VIR recommends leaving drain in place until [facility] fistula has resolved as they expect the fluid collection will return. Ascitic fluid studies with few neutrophils reassuring against bacterial infection. She has continued to have some cutaneous drainage and swab from site has grown mixed anaerobes and Candida dubliniensis.  - Cefepime 1 gram every 12 and oral metronidazole  - Started fluconazole for Candida    AKI Baseline Cr 0.4 and elevated to 0.6, 1.8 and now 2.5. Suspect vancomycin associated nephrotoxicity in  setting of elevated levels. Now monitoring vancomycin by levels. She is maintaining adequate urine output but has noticed some lower extremity edema.  - Daily Cr  - Dose medications for reduced renal function  - Ensure adequate hydration    Malnutrition secondary to chronic bowel obstruction requiring TPN  - She will need to continue TPN while on bowel rest  ??  Chronic small bowel obstruction??- TEG Tolerating clears and bites of food at home.  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 Reports fatigue and limited exercise tolerance.  - Transfuse 1 unit pRBC    DVT Ppx: Lovenox  Code Status: Full    ___________________________________________________________________    Subjective:    She feeling better in a private room today. Still with purulent discharge from [facility] fistula. She has not had a BM in a few days but passing flatus, discussed trying suppository or sorbitol since Miralax is large volume.    Recent Results (from the past 24 hour(s))   POCT Glucose    Collection Time: 09/27/21  1:29 AM   Result Value Ref Range    Glucose, POC 134 70 - 179 mg/dL   Comprehensive Metabolic Panel    Collection Time: 09/27/21  6:25 AM   Result Value Ref Range    Sodium 142 135 - 145 mmol/L    Potassium 3.3 (L) 3.4 - 4.8 mmol/L    Chloride 109 (H) 98 - 107 mmol/L    CO2 25.0 20.0 - 31.0 mmol/L    Anion Gap 8 5 - 14 mmol/L    BUN 13 9 - 23 mg/dL    Creatinine 1.61 (H) 0.60 - 0.80 mg/dL    BUN/Creatinine Ratio 5     eGFR CKD-EPI (2021) Female 23 (L) >=60 mL/min/1.59m2    Glucose 143 70 - 179 mg/dL    Calcium 8.3 (L) 8.7 - 10.4 mg/dL    Albumin 2.2 (L) 3.4 - 5.0 g/dL    Total Protein 4.8 (L) 5.7 - 8.2 g/dL    Total Bilirubin 0.2 (L) 0.3 - 1.2 mg/dL    AST 18 <=09 U/L    ALT <7 (L) 10 - 49 U/L    Alkaline Phosphatase 51 46 - 116 U/L   Magnesium Level    Collection Time: 09/27/21  6:25 AM   Result Value Ref Range    Magnesium 1.8 1.6 - 2.6 mg/dL   Phosphorus Level    Collection Time: 09/27/21  6:25 AM   Result Value Ref Range    Phosphorus 4.5 2.4 - 5.1 mg/dL   CBC w/ Differential    Collection Time: 09/27/21  6:25 AM   Result Value Ref Range    WBC 7.1 3.6 - 11.2 10*9/L    RBC 2.49 (L) 3.95 - 5.13 10*12/L    HGB 6.9 (L) 11.3 - 14.9 g/dL    HCT 60.4 (L) 54.0 - 44.0 %    MCV 82.2 77.6 - 95.7 fL    MCH 27.8 25.9 - 32.4 pg    MCHC 33.8 32.0 - 36.0 g/dL  RDW 17.0 (H) 12.2 - 15.2 %    MPV 6.9 6.8 - 10.7 fL    Platelet 243 150 - 450 10*9/L    Neutrophils % 81.4 %    Lymphocytes % 9.4 %    Monocytes % 8.5 %    Eosinophils % 0.6 %    Basophils % 0.1 %    Absolute Neutrophils 5.8 1.8 - 7.8 10*9/L    Absolute Lymphocytes 0.7 (L) 1.1 - 3.6 10*9/L    Absolute Monocytes 0.6 0.3 - 0.8 10*9/L    Absolute Eosinophils 0.0 0.0 - 0.5 10*9/L    Absolute Basophils 0.0 0.0 - 0.1 10*9/L    Anisocytosis Slight (A) Not Present   POCT Glucose    Collection Time: 09/27/21  6:35 AM   Result Value Ref Range    Glucose, POC 129 70 - 179 mg/dL   POCT Glucose    Collection Time: 09/27/21 10:35 AM   Result Value Ref Range    Glucose, POC 125 70 - 179 mg/dL   Type and Screen    Collection Time: 09/27/21  1:35 PM   Result Value Ref Range    ABO Grouping O POS     Antibody Screen NEG    Hemoglobin    Collection Time: 09/27/21  1:35 PM   Result Value Ref Range    HGB 6.9 (L) 11.3 - 14.9 g/dL   Prepare RBC    Collection Time: 09/27/21  4:29 PM   Result Value Ref Range    Crossmatch Compatible     Unit Blood Type O Pos     ISBT Number 5100     Unit # [id number]     Status Ready     Spec Expiration [id number]     Product ID Red Blood Cells     PRODUCT CODE [id number]    POCT Glucose    Collection Time: 09/27/21  4:31 PM   Result Value Ref Range    Glucose, POC 186 (H) 70 - 179 mg/dL     Labs/Studies:  Labs and Studies from the last 24hrs per EMR and Reviewed    Objective:  Temp:  [36.6 ??C (97.9 ??F)-36.8 ??C (98.2 ??F)] 36.8 ??C (98.2 ??F)  Heart Rate:  [85-106] 90  Resp:  [14-23] 14  BP: (99-139)/(72-113) 110/82  SpO2:  [100 %] 100 %    CONSTITUTIONAL: Alert, in no acute distress  HEENT: Normocephalic, atraumatic  NECK: Left neck with enteral feeding tube for decompression, Right chest CVAD  CARDIOVASCULAR: Normal rate, rhythm sounds regular, no murmur, trace lower extremity edema  PULM: Clear to auscultation bilaterally, normal work of breathing  GASTROINTESTINAL: Soft, nondistended, bandage in place above her umbilicus with scant purulent drainage  MSK: No obvious deformities  NEUROLOGIC: No focal deficits    I personally spent 60 minutes face-to-face and non-face-to-face in the care of this patient, which includes all pre, intra, and post visit time on the date of service.  All documented time was specific to the E/M visit and does not include any procedures that may have been performed.

## 2021-09-28 NOTE — Unmapped (Signed)
GYN ONC PROGRESS NOTE    Assessment and Plan:  Colleen Russo is a 46 y.o. 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 Now s/p PICC removal replacement with IJ. Remains with benign abdominal exam, no concern for ischemia in the setting of her chronic obstruction. Improving glycemic control now on TPN. Continues on IV antibiotics and fungal coverage for bacteremia likely intraabdominal/cutaneous etiology.    Onc: Recurrent Clear Cell Carcinoma of the Ovary   - Primary Oncologist: [name]  - s/p XL??with??radical tumor debulking including??total hysterectomy,??bilateral salpingo-oophorectomy, infra-gastric??omentectomy, and??washings??on 07/21/2019  -??s/p 6 cycles carbo/taxol, completed 12/05/19  -CT 2/022 concerning for progression, started on carbo/gem 2/22-5/24/22.  -CT 12/2020 stable disease  - 01/23/21:??Diagnostic laparoscopy, exploratory laparotomy, unavoidable enterotomy with repair  -02/2021: noted to have EC fistuala,??OR on 7/2 for incision, exploration, and drainage of subcutaneous abscess??(6x8 cm) lateral to recent ex-lap incision. No definitive succus seen intra-op.??Treated conservatively with bowel rest and TPN for 1 month then had several I&D of recollected abscess at site of EC fistula with drain placed and left until clinical and imaging evidence of resolution????  -12/29-08/07/21 admission for partial SBO and abdominal wall fluid collection, biopsy of fluid collection consistent at first for upper GI or pancreatobiliary primary??  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  - Last CA-125:??180 on 09/06/2021    Refractory Hypoglycemia - T2DM   - Discharged 2/9 on NPH 20 units daily to take 1 hr prior to TPN > attempted to decrease as an outpatient and ultimately discontinued given symptomatic lows, last took subq insulin 2/16   - Per Endocrine, unclear etiology  - Management per Primary Team, Endocrine following, appreciate recommendations; signed off   - No longer on dextrose IV after PN starts   - Dexamethasone taper per Medicine   - No indication for longterm steroids from GynOnc perspective   - Last hypoglycemic 52 2/21, off dextrose     Enterocutaneous Fistula - Hx abscesses - Bacteremia, new abdominal ascites  - History of enterocutaneous fistula,??and abscesses requiring drainage.  - Most recently, abscess drain placement by VIR 2/7, initially on Zosyn > s/p Levofloxacin and Flagyl   - BCx positive   - 2/19 Staph epi, S. hominis, MRSA, Bacillus cereus   - ID consulted likely component of PICC colonization and intraabdominal source   - 2/21 Repeat BCx NGTD   - Stop Vanc (new AKI), stop Levo, cont Fluconazole, cont Cefepime, cont on PO Flagyl  - 2/19 Abdominal/cutaneous Cx; 1+ Candida dubliniensis> Fluconazole (2/23- )  - 2/19 repeat CTAP with redemonstration of ECF, decreased air and fluid collection along left lower anterior abdominal wall which is significantly decreased s/p VIR drain 2/7 making new abdominal abscess less likely, however bowel edema with possible ischemic component  - 2/21 VIR paracentesis 150cc amber fluid   - Culture prelim NGTD   - Cytology pending   - Protein 2.8, Glucose 69, Albumin 1.6   - Micro most consistent with intraabdominal and cutaneous etiology of bacteremia  - Abdominal exam remains benign, patient afebrile and clinically well appearing without concerns for peritonitis    Small bowel obstruction - TEG  - On admit denies abdominal pain, nausea, vomiting on admit. Previously tolerating clears and small bites of food at home   - 2/3 TEG placement  - Home reg:  scopolamine patch, using Reglan and Compazine PRN  - 2/19 CTAP persistent small bowel obstruction, likely closed loop component with slight interval increase in enhancement of affected bowel mucosa  - 2/20 Colorectal surgery cs: agree ECF does not appear to be the source of bacteremia given drainage of previously existing fluid collections  - Remains with a benign abdominal exam  - Will discuss no PO intake longterm to prevent recurrent abdominal fluid collection/abscess  ??  Sick Euthyroid Syndrome  - Last admit TSH 0.237, FT4 0.54  ????  Chronic Pain  - S/p Palliative Care consult, pain well controlled on 50 mcg fentanyl patches and Tylenol PRN  ??  Poor Nutrition  - S/p Nutrition consult 2/18  - Currently NPO  - PPN started 2/23- with plan to transition to TPN after replacement of central line    Anemia of Chronic Disease, stable  - Admit Hgb 7.9 stable from prior  ??  PPX: Lovenox  ??  Code Status: full code  ??  Disposition: floor    Patient seen and plan discussed with Dr. Merry Proud and Attending Dr. Reyne Dumas immediately available.    Subjective:  Patient says she is doing really well this morning. She feels very swollen and puffy. She inquired about if her tubes should be out or not, she will discuss with the medicine team today. She says her glucose levels have been very consistent. She inquired about her kidney function and was excited that her creatine has decreased today. She wanted to know if she could vent her clamp because she has spitting a lot. She reported feeling nausea but was confused about whether not she had to be clamped for TPN. She unclamped this morning after clarification. She reported having a wave of nausea whenever she touched the area. She inquired about when she could potentially go home. No other complaints at this time.     Objective:  Temp:  [36.5 ??C (97.7 ??F)-37.2 ??C (99 ??F)] 36.5 ??C (97.7 ??F)  Heart Rate:  [79-106] 79  Resp:  [14-23] 18  BP: (99-139)/(72-113) 127/94  SpO2:  [100 %] 100 %    -General:                    Well-appearing in NAD.  -CV:                             Regular rate and rhythm  -Chest:                    Normal WOB.   -Abd:                           Non-tender, non-distended abdomen.  ECF dressing w/ scan green drainage on dressing.  -Extremities:              Non pitting edema bilaterally, symmetric and nontender  -Psych:                       Appropriate.The patient is awake and alert.    Medications (scheduled)   ??? Cefepime  1 g Intravenous Q12H [facility]   ??? dexAMETHasone  1 mg Oral Daily    Followed by   ??? [START ON 09/29/2021] dexAMETHasone  0.5 mg Oral Daily   ??? docusate sodium  100 mg Oral Daily   ??? fentaNYL  1 patch Transdermal Q72H   ???  fluconazole (DIFLUCAN) INTRAVENOUS  200 mg Intravenous Q24H   ??? heparin, porcine (PF)  200 Units Intravenous Q MWF   ??? flu vacc qs2022-23 6mos up(PF)  0.5 mL Intramuscular During hospitalization   ??? lidocaine  1 patch Transdermal Daily   ??? metroNIDAZOLE  500 mg Intravenous [facility]   ??? OLANZapine  2.5 mg Oral Daily   ??? OLANZapine  5 mg Oral Nightly   ??? pantoprazole  40 mg Oral Daily   ??? polyethylene glycol  17 g Oral Daily   ??? scopolamine  1 patch Topical Q72H   ??? senna  2 tablet Oral Nightly   ??? Vancomycin - Pharmacy dosing by levels   Other Pharmacy dosing       Medications (prn)  acetaminophen, bisacodyL, calcium carbonate, metoclopramide, naloxone, ondansetron, simethicone, sorbitoL    Labs:   Lab Results   Component Value Date    WBC 7.1 09/27/2021    HGB 6.9 (L) 09/27/2021    HCT 20.4 (L) 09/27/2021    PLT 243 09/27/2021       Lab Results   Component Value Date    NA 142 09/27/2021    K 3.3 (L) 09/27/2021    CL 109 (H) 09/27/2021    CO2 25.0 09/27/2021    BUN 13 09/27/2021    CREATININE 2.58 (H) 09/27/2021    GLU 143 09/27/2021    CALCIUM 8.3 (L) 09/27/2021    MG 1.8 09/27/2021    PHOS 4.5 09/27/2021       Lab Results   Component Value Date    BILITOT 0.2 (L) 09/27/2021    BILIDIR 0.10 09/11/2021    PROT 4.8 (L) 09/27/2021    ALBUMIN 2.2 (L) 09/27/2021    ALT <7 (L) 09/27/2021    AST 18 09/27/2021    ALKPHOS 51 09/27/2021       Lab Results   Component Value Date    INR 1.14 09/21/2021    APTT 30.7 09/21/2021     Scribe's Attestation: Tarri Glenn, MD obtained and performed the history, physical exam and medical decision making elements that were  entered into the chart. Documentation assistance was provided by me personally, a scribe. Signed by Freddie Apley Scribe, on September 28, 2021 at 6:10 AM.       ----------------------------------------------------------------------------------------------------------------------  September 28, 2021 11:54 PM. Documentation assistance provided by the Scribe. I was present during the time the encounter was recorded. The information recorded by the Scribe was done at my direction and has been reviewed and validated by me.  ----------------------------------------------------------------------------------------------------------------------

## 2021-09-28 NOTE — Unmapped (Signed)
Pt has been alert and oriented x4. Pt has denied shortness of breath and remains on room air. Pt has denied pain. Provided PRN suppository for constipation. Still has not had a BM. Parental nutrition continues to infuse through IV. Pt flushed her JP drain today. Very small output (less than 1mL) noted. Continue with plan of care.     Problem: Adult Inpatient Plan of Care  Goal: Plan of Care Review  Outcome: Ongoing - Unchanged  Goal: Patient-Specific Goal (Individualized)  Outcome: Ongoing - Unchanged  Goal: Absence of Hospital-Acquired Illness or Injury  Outcome: Ongoing - Unchanged  Intervention: Identify and Manage Fall Risk  Recent Flowsheet Documentation  Taken 09/27/2021 1000 by Oletha Blend, RN  Safety Interventions:   low bed   lighting adjusted for tasks/safety   nonskid shoes/slippers when out of bed   family at bedside  Intervention: Prevent and Manage VTE (Venous Thromboembolism) Risk  Recent Flowsheet Documentation  Taken 09/27/2021 1000 by Oletha Blend, RN  Activity Management: activity adjusted per tolerance  Goal: Optimal Comfort and Wellbeing  Outcome: Ongoing - Unchanged  Goal: Readiness for Transition of Care  Outcome: Ongoing - Unchanged  Goal: Rounds/Family Conference  Outcome: Ongoing - Unchanged     Problem: Self-Care Deficit  Goal: Improved Ability to Complete Activities of Daily Living  Outcome: Ongoing - Unchanged     Problem: Skin Injury Risk Increased  Goal: Skin Health and Integrity  Outcome: Ongoing - Unchanged     Problem: Heart Failure Comorbidity  Goal: Maintenance of Heart Failure Symptom Control  Outcome: Ongoing - Unchanged     Problem: Hypertension Comorbidity  Goal: Blood Pressure in Desired Range  Outcome: Ongoing - Unchanged     Problem: Pain Chronic (Persistent) (Comorbidity Management)  Goal: Acceptable Pain Control and Functional Ability  Outcome: Ongoing - Unchanged

## 2021-09-28 NOTE — Unmapped (Signed)
Patient tolerated treatments and medications well with no adverse events throughout the shift.  Vs remain within normal limits.  Patient tpn started at [place]n, 1u rbc infused prior to beginging tpn. Ppn discontinued. Will continue to monitor.    Problem: Adult Inpatient Plan of Care  Goal: Plan of Care Review  Outcome: Progressing  Flowsheets (Taken 09/28/2021 0324)  Plan of Care Reviewed With: patient  Goal: Patient-Specific Goal (Individualized)  Outcome: Progressing  Flowsheets (Taken 09/28/2021 0324)  Patient-Specific Goals (Include Timeframe): patient will tolerate treatments and medications well with no adverse events this shift  Goal: Absence of Hospital-Acquired Illness or Injury  Intervention: Identify and Manage Fall Risk  Recent Flowsheet Documentation  Taken 09/28/2021 0200 by Donetta Potts, RN  Safety Interventions:   low bed   nonskid shoes/slippers when out of bed   fall reduction program maintained  Intervention: Prevent Infection  Recent Flowsheet Documentation  Taken 09/28/2021 0200 by Donetta Potts, RN  Infection Prevention:   single patient room provided   rest/sleep promoted   personal protective equipment utilized   hand hygiene promoted   equipment surfaces disinfected   environmental surveillance performed

## 2021-09-29 LAB — COMPREHENSIVE METABOLIC PANEL
ALBUMIN: 2.4 g/dL — ABNORMAL LOW (ref 3.4–5.0)
ALKALINE PHOSPHATASE: 54 U/L (ref 46–116)
ALT (SGPT): <7 U/L — ABNORMAL LOW (ref 10–49)
ANION GAP: 8 mmol/L (ref 5–14)
AST (SGOT): 17 U/L (ref <=34)
BILIRUBIN TOTAL: 0.2 mg/dL — ABNORMAL LOW (ref 0.3–1.2)
BLOOD UREA NITROGEN: 29 mg/dL — ABNORMAL HIGH (ref 9–23)
BUN / CREAT RATIO: 16
CALCIUM: 8.6 mg/dL — ABNORMAL LOW (ref 8.7–10.4)
CHLORIDE: 106 mmol/L (ref 98–107)
CO2: 29 mmol/L (ref 20.0–31.0)
CREATININE: 1.84 mg/dL — ABNORMAL HIGH
EGFR CKD-EPI (2021) FEMALE: 34 mL/min/{1.73_m2} — ABNORMAL LOW (ref >=60)
GLUCOSE RANDOM: 157 mg/dL (ref 70–179)
POTASSIUM: 3.3 mmol/L — ABNORMAL LOW (ref 3.4–4.8)
PROTEIN TOTAL: 5.2 g/dL — ABNORMAL LOW (ref 5.7–8.2)
SODIUM: 143 mmol/L (ref 135–145)

## 2021-09-29 LAB — CBC W/ AUTO DIFF
BASOPHILS ABSOLUTE COUNT: 0 10*9/L (ref 0.0–0.1)
BASOPHILS RELATIVE PERCENT: 0.6 %
EOSINOPHILS ABSOLUTE COUNT: 0.1 10*9/L (ref 0.0–0.5)
EOSINOPHILS RELATIVE PERCENT: 0.8 %
HEMATOCRIT: 26 % — ABNORMAL LOW (ref 34.0–44.0)
HEMOGLOBIN: 9 g/dL — ABNORMAL LOW (ref 11.3–14.9)
LYMPHOCYTES ABSOLUTE COUNT: 1 10*9/L — ABNORMAL LOW (ref 1.1–3.6)
LYMPHOCYTES RELATIVE PERCENT: 12 %
MEAN CORPUSCULAR HEMOGLOBIN CONC: 34.7 g/dL (ref 32.0–36.0)
MEAN CORPUSCULAR HEMOGLOBIN: 28.5 pg (ref 25.9–32.4)
MEAN CORPUSCULAR VOLUME: 82.1 fL (ref 77.6–95.7)
MEAN PLATELET VOLUME: 7.1 fL (ref 6.8–10.7)
MONOCYTES ABSOLUTE COUNT: 0.8 10*9/L (ref 0.3–0.8)
MONOCYTES RELATIVE PERCENT: 9.8 %
NEUTROPHILS ABSOLUTE COUNT: 6.1 10*9/L (ref 1.8–7.8)
NEUTROPHILS RELATIVE PERCENT: 76.8 %
PLATELET COUNT: 254 10*9/L (ref 150–450)
RED BLOOD CELL COUNT: 3.16 10*12/L — ABNORMAL LOW (ref 3.95–5.13)
RED CELL DISTRIBUTION WIDTH: 15.9 % — ABNORMAL HIGH (ref 12.2–15.2)
WBC ADJUSTED: 8 10*9/L (ref 3.6–11.2)

## 2021-09-29 LAB — MAGNESIUM: MAGNESIUM: 1.9 mg/dL (ref 1.6–2.6)

## 2021-09-29 LAB — PHOSPHORUS: PHOSPHORUS: 3.9 mg/dL (ref 2.4–5.1)

## 2021-09-29 MED ADMIN — cefepime (MAXIPIME) 1 g in sodium chloride 0.9 % (NS) 100 mL IVPB-connector bag: 1 g | INTRAVENOUS | @ 01:00:00 | Stop: 2021-10-01

## 2021-09-29 MED ADMIN — pantoprazole (PROTONIX) EC tablet 40 mg: 40 mg | ORAL | @ 14:00:00

## 2021-09-29 MED ADMIN — docusate sodium (COLACE) capsule 100 mg: 100 mg | ORAL | @ 14:00:00

## 2021-09-29 MED ADMIN — potassium chloride 20 mEq in 100 mL IVPB Premix: 20 meq | INTRAVENOUS | @ 16:00:00 | Stop: 2021-09-29

## 2021-09-29 MED ADMIN — dexAMETHasone (DECADRON) tablet 0.5 mg: .5 mg | ORAL | @ 16:00:00 | Stop: 2021-10-06

## 2021-09-29 MED ADMIN — lidocaine (LIDODERM) 5 % patch 1 patch: 1 | TRANSDERMAL | @ 03:00:00

## 2021-09-29 MED ADMIN — furosemide (LASIX) injection 80 mg: 80 mg | INTRAVENOUS | @ 17:00:00 | Stop: 2021-09-29

## 2021-09-29 MED ADMIN — fat emulsion 20 % with fish oil (SMOFLIPID) infusion 250 mL: 250 mL | INTRAVENOUS | @ 06:00:00 | Stop: 2021-09-29

## 2021-09-29 MED ADMIN — metroNIDAZOLE (FLAGYL) IVPB 500 mg: 500 mg | INTRAVENOUS | @ 11:00:00 | Stop: 2021-10-09

## 2021-09-29 MED ADMIN — metroNIDAZOLE (FLAGYL) IVPB 500 mg: 500 mg | INTRAVENOUS | @ 02:00:00 | Stop: 2021-10-02

## 2021-09-29 MED ADMIN — Adult Parenteral Nutrition: INTRAVENOUS | @ 06:00:00 | Stop: 2021-09-30

## 2021-09-29 MED ADMIN — senna (SENOKOT) tablet 2 tablet: 2 | ORAL | @ 01:00:00

## 2021-09-29 MED ADMIN — scopolamine (TRANSDERM-SCOP) 1 mg over 3 days topical patch 1 mg: 1 | TOPICAL | @ 11:00:00

## 2021-09-29 MED ADMIN — dronabinoL (MARINOL) capsule 2.5 mg: 2.5 mg | ORAL | @ 01:00:00

## 2021-09-29 MED ADMIN — OLANZapine (ZyPREXA) tablet 2.5 mg: 2.5 mg | ORAL | @ 01:00:00

## 2021-09-29 MED ADMIN — cefepime (MAXIPIME) 1 g in sodium chloride 0.9 % (NS) 100 mL IVPB-connector bag: 1 g | INTRAVENOUS | @ 14:00:00 | Stop: 2021-10-09

## 2021-09-29 MED ADMIN — potassium chloride 20 mEq in 100 mL IVPB Premix: 20 meq | INTRAVENOUS | @ 14:00:00 | Stop: 2021-09-29

## 2021-09-29 MED ADMIN — metroNIDAZOLE (FLAGYL) IVPB 500 mg: 500 mg | INTRAVENOUS | @ 20:00:00 | Stop: 2021-10-09

## 2021-09-29 MED ADMIN — fluconazole (DIFLUCAN) 2 mg/mL in sodium chloride 0.9% 200 mg: 200 mg | INTRAVENOUS | @ 22:00:00 | Stop: 2021-10-09

## 2021-09-29 NOTE — Unmapped (Signed)
Pt has  no CP distress. Denies pain. TPN continued to right TIJ. ABT given without adverse reaction. Left transesophageal gastric tube in place and abdominal JP drain, with no output. Pt able to ambulate in the hallway and sit on chair.   Problem: Adult Inpatient Plan of Care  Goal: Plan of Care Review  Outcome: Ongoing - Unchanged  Goal: Patient-Specific Goal (Individualized)  Outcome: Ongoing - Unchanged  Flowsheets (Taken 09/28/2021 1737)  Patient-Specific Goals (Include Timeframe): Pt will tolerate ambulating in the hallway 2x during shift 09/28/21.  Goal: Absence of Hospital-Acquired Illness or Injury  Outcome: Ongoing - Unchanged  Intervention: Identify and Manage Fall Risk  Recent Flowsheet Documentation  Taken 09/28/2021 0800 by Albertha Ghee, RN  Safety Interventions: low bed  Intervention: Prevent Skin Injury  Recent Flowsheet Documentation  Taken 09/28/2021 0800 by Albertha Ghee, RN  Skin Protection: cleansing with dimethicone incontinence wipes  Intervention: Prevent and Manage VTE (Venous Thromboembolism) Risk  Recent Flowsheet Documentation  Taken 09/28/2021 0900 by Albertha Ghee, RN  VTE Prevention/Management:  ??? anticoagulant therapy  ??? ambulation promoted  Goal: Optimal Comfort and Wellbeing  Outcome: Ongoing - Unchanged  Goal: Readiness for Transition of Care  Outcome: Ongoing - Unchanged  Goal: Rounds/Family Conference  Outcome: Ongoing - Unchanged     Problem: Self-Care Deficit  Goal: Improved Ability to Complete Activities of Daily Living  Outcome: Ongoing - Unchanged     Problem: Skin Injury Risk Increased  Goal: Skin Health and Integrity  Outcome: Ongoing - Unchanged  Intervention: Optimize Skin Protection  Recent Flowsheet Documentation  Taken 09/28/2021 0800 by Albertha Ghee, RN  Skin Protection: cleansing with dimethicone incontinence wipes     Problem: Heart Failure Comorbidity  Goal: Maintenance of Heart Failure Symptom Control  Outcome: Ongoing - Unchanged     Problem: Hypertension Comorbidity  Goal: Blood Pressure in Desired Range  Outcome: Ongoing - Unchanged     Problem: Pain Chronic (Persistent) (Comorbidity Management)  Goal: Acceptable Pain Control and Functional Ability  Outcome: Ongoing - Unchanged

## 2021-09-29 NOTE — Unmapped (Cosign Needed)
GYN ONC PROGRESS NOTE    Assessment and Plan:  Colleen Russo is a 46 y.o. 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 Now s/p PICC removal replacement with IJ. Remains with benign abdominal exam, no concern for ischemia in the setting of her chronic obstruction. Hypoglycemia now resolved on TPN. Continues on IV antibiotics and fungal coverage for bacteremia likely intraabdominal/cutaneous etiology. Appreciate management by Medicine team. Anticipate discharge within the next 1-3 days.    Onc: Recurrent Clear Cell Carcinoma of the Ovary   - Primary Oncologist: [name]  - s/p XL??with??radical tumor debulking including??total hysterectomy,??bilateral salpingo-oophorectomy, infra-gastric??omentectomy, and??washings??on 07/21/2019  -??s/p 6 cycles carbo/taxol, completed 12/05/19  -CT 2/022 concerning for progression, started on carbo/gem 2/22-5/24/22.  -CT 12/2020 stable disease  - 01/23/21:??Diagnostic laparoscopy, exploratory laparotomy, unavoidable enterotomy with repair  -02/2021: noted to have EC fistuala,??OR on 7/2 for incision, exploration, and drainage of subcutaneous abscess??(6x8 cm) lateral to recent ex-lap incision. No definitive succus seen intra-op.??Treated conservatively with bowel rest and TPN for 1 month then had several I&D of recollected abscess at site of EC fistula with drain placed and left until clinical and imaging evidence of resolution????  -12/29-08/07/21 admission for partial SBO and abdominal wall fluid collection, biopsy of fluid collection consistent at first for upper GI or pancreatobiliary primary??  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  - Last CA-125:??180 on 09/06/2021  - Our team will facilitate follow up upon discharge.     Refractory Hypoglycemia, resolved - T2DM   - Discharged 2/9 on NPH 20 units daily to take 1 hr prior to TPN > attempted to decrease as an outpatient and ultimately discontinued given symptomatic lows, last took subq insulin 2/16   - Per Endocrine, unclear etiology  - Management per Primary Team, Endocrine following, appreciate recommendations; signed off   - No longer on dextrose IV after PN starts   - Dexamethasone taper per Medicine   - No indication for longterm steroids from GynOnc  perspective  - Euglycemic off dextrose    Enterocutaneous Fistula - Hx abscesses - Bacteremia, new abdominal ascites  - History of enterocutaneous fistula,??and abscesses requiring drainage.  - Most recently, abscess drain placement by VIR 2/7, initially on Zosyn > s/p Levofloxacin and Flagyl   - BCx positive   - 2/19 Staph epi, S. hominis, MRSA, Bacillus cereus   - ID consulted likely component of PICC colonization and intraabdominal source   - 2/21 Repeat BCx NGTD  - 2/19 Abdominal/cutaneous Cx; 1+ Candida dubliniensis> Fluconazole (2/23- )  - 2/19 repeat CTAP with redemonstration of ECF, decreased air and fluid collection along left lower anterior abdominal wall which is significantly decreased s/p VIR drain 2/7 making new abdominal abscess less likely, however bowel edema with possible ischemic component  - 2/21 VIR paracentesis 150cc amber fluid   - Culture NGTD   - Cytology pending   - Protein 2.8, Glucose 69, Albumin 1.6   - Micro most consistent with intraabdominal and cutaneous etiology of bacteremia  - Abdominal exam remains benign, patient afebrile and clinically well appearing without concerns for peritonitis  - Continue Cefepime/Flagyl/Fluconazole per ID recommendations. Last dose on March 8th.    Small bowel obstruction - TEG  - s/p TEG placement on 2/3  - Home reg: scopolamine patch, using Reglan and Compazine PRN  -  2/19 CTAP persistent small bowel obstruction, likely closed loop component with slight interval increase in enhancement of affected bowel mucosa  - 2/20 Colorectal surgery cs: agree ECF does not appear to be the source of bacteremia given drainage of previously existing fluid collections  - Remains with a benign abdominal exam  ??  Sick Euthyroid Syndrome  - Last admit TSH 0.237, FT4 0.54  ????  Chronic Pain  - S/p Palliative Care consult, pain well controlled on 50 mcg fentanyl patches and Tylenol PRN  ??  Poor Nutrition  - S/p Nutrition consult 2/18  - Currently NPO  - Continue TPN    Anemia of Chronic Disease, stable  - Admit Hgb 7.9 stable from prior  ??  PPX: Lovenox  ??  Code Status: full code  ??  Disposition: floor    Patient seen and plan discussed with Dr. Merry Proud and Attending Dr. Reyne Dumas immediately available.    Subjective:  Patient reports feeling well this morning. Her only concern is increasing swelling in her lower extremities. She is in good spirits and desires to go home. Her pain is well-controlled with her current regimen. She is tolerating sips of liquids. She is ambulating around the hall at least twice daily.     Objective:  Temp:  [36.5 ??C (97.7 ??F)-37 ??C (98.6 ??F)] 36.5 ??C (97.7 ??F)  Heart Rate:  [77-86] 81  Resp:  [16] 16  BP: (119-138)/(83-108) 133/108  SpO2:  [100 %] 100 %    -General:                    Well-appearing in NAD.  -CV:                             Regular rate and rhythm  -Chest:                    Normal WOB.   -Abd:                           Non-tender, non-distended abdomen.  ECF dressing clean/dry/intact  -Extremities:              Non pitting edema bilaterally, symmetric and nontender  -Psych:                       Appropriate.The patient is awake and alert.    Medications (scheduled)   ??? Cefepime  1 g Intravenous Q12H [facility]   ??? dexAMETHasone  0.5 mg Oral Daily   ??? docusate sodium  100 mg Oral Daily   ??? dronabinoL  2.5 mg Oral Nightly   ??? fentaNYL  1 patch Transdermal Q72H   ??? fluconazole (DIFLUCAN) INTRAVENOUS  200 mg Intravenous Q24H   ??? heparin, porcine (PF)  200 Units Intravenous Q MWF   ??? flu vacc qs2022-23 6mos up(PF) 0.5 mL Intramuscular During hospitalization   ??? lidocaine  1 patch Transdermal Daily   ??? metroNIDAZOLE  500 mg Intravenous [facility]   ??? OLANZapine  2.5 mg Oral Daily   ??? OLANZapine  2.5 mg Oral Nightly   ??? pantoprazole  40 mg Oral Daily   ??? polyethylene glycol  17 g Oral Daily   ??? scopolamine  1 patch Topical Q72H   ??? senna  2 tablet Oral Nightly       Medications (prn)  acetaminophen, bisacodyL, calcium carbonate,  metoclopramide, naloxone, ondansetron, simethicone, sorbitoL    Labs:   Lab Results   Component Value Date    WBC 8.0 09/29/2021    HGB 9.0 (L) 09/29/2021    HCT 26.0 (L) 09/29/2021    PLT 254 09/29/2021       Lab Results   Component Value Date    NA 143 09/29/2021    K 3.3 (L) 09/29/2021    CL 106 09/29/2021    CO2 29.0 09/29/2021    BUN 29 (H) 09/29/2021    CREATININE 1.84 (H) 09/29/2021    GLU 157 09/29/2021    CALCIUM 8.6 (L) 09/29/2021    MG 1.9 09/29/2021    PHOS 3.9 09/29/2021       Lab Results   Component Value Date    BILITOT 0.2 (L) 09/29/2021    BILIDIR 0.10 09/11/2021    PROT 5.2 (L) 09/29/2021    ALBUMIN 2.4 (L) 09/29/2021    ALT <7 (L) 09/29/2021    AST 17 09/29/2021    ALKPHOS 54 09/29/2021       Lab Results   Component Value Date    INR 1.14 09/21/2021    APTT 30.7 09/21/2021     Scribe's Attestation: Yancey Flemings, MD obtained and performed the history, physical exam and medical decision making elements that were  entered into the chart. Documentation assistance was provided by me personally, a scribe. Signed by Tinnie Gens Scribe, on September 29, 2021 at 6:18 AM.     ----------------------------------------------------------------------------------------------------------------------  September 29, 2021 1:15 PM. Documentation assistance provided by the Scribe. I was present during the time the encounter was recorded. The information recorded by the Scribe was done at my direction and has been reviewed and validated by me.  ----------------------------------------------------------------------------------------------------------------------

## 2021-09-29 NOTE — Unmapped (Signed)
Division of Infectious Diseases  General Inpatient Consultation Service     For any questions about this consult, page [phone] (Gen C Follow-up Pager).      Colleen Russo is being seen in consultation at the request of Arman Filter, MD for evaluation and management of bacteremia and abd wall abscess/enterocutaneous fistula.     Plan for 09/28/2021  Diagnostic  ??? Follow pending blood cultures and peritoneal cultures  ??? Follow up abdominal swab culture from 2/19: ID has requested fluconazole susceptibility testing, due to return on or around 10/03/21    ??? Monitor for antimicrobial toxicity with the following:  ??? Daily BMP w dif  for now (given AKI), CBCS at least weekly  ??? Repeat EKG today or tomorrow-s/p 3d of fluconazole therapy     Treatment  Polymicrobial bacteremia  ??? STOP vancomycin (dosed by pharmacy for goal trough 10-15) last vanco level 2/23 was 34.6 so likely still has systemic levels on 2/24 and will have completed 7d course     Abdominal wall abscess, enterocutaneous fistula  ??? Continue Fluconazole, which will need to be renally dose adjusted d/t findings of Candida dubliniensis on abdominal swab and persistent drainage. Would limit to 10-14d course.  ??? Continue Flagyl   ??? Continue Cefepime 1g Q12, this may need further dose adjustments pending CrCl, please renally adjust  ??? Duration of therapy = Recommend 4wks of therapy for her abdominal infections.  o start date = 09/10/21 when drain was placed, though therapy adjusted 2/22 from levo->cefepime.  o end date = 10/09/21    I discussed the plans for today with patient on 09/29/2021.    Our service will continue to follow.  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     At this time patient has been on broad spectrum abx since 2/7 with drain placement. Repeat cultures 2/19 w/ candida -- though unknown if this is  contributing to current presentation. At this time would favor antibiotics through 3/8 as this would cover her polymicrobial bacteremia and give sufficient time for complicated intra-abdominal infection. Should she have clinical changes such as worsened discharge, would wonder if drainage represents EC fistula and contents, or ongoing infection not covered by above regimen.   September 28, 2021   Patient has: []  acute illness w/systemic sxs  [mod] [x]  illness posing risk to life or function  [high]   I reviewed:   (3+) [x]  primary team note [x]  consultant note(s) []  procedure/op note(s) [x]  micro result(s)    [x]  CBC results [x]  chemistry results []  radiology report(s) []  w/indep. historian   I independently visualized:   (any)   []  cxs/plates in lab []  plain film images []  CT images []  PET images    []  path slide(s) []  ECG tracing []  MRI images []  nuclear scan   I discussed: (any) []  micro and/or path w/lab personnel []  drug options and/or interactions w/ID pharmD    []   procedure/OR findings w/other MD(s) []  echo and/or imaging w/other MD(s)    []  mgm't w/attending(s) involved in case []  setting up home abx w/OPAT team   Mgm't requires: []  prescription drug(s)  [mod] [x]  intensive toxicity monitoring  [high]   Minutes: []  25-34      [x]  35-49      []  50+ []  30-74 providing critical care on the unit     Interval notes & result reports show: improving AKI, afebrile.   Patient reports: No f/c/s, improving nausea. Continued scant drainage from skin sinus tract superior to abdominal drain exit site.     My interpretation of the data I reviewed is: Treated gram positive bacteremia, persistent enterocutaneous fistula with likely associated abdominal soft tissue infection.         # Polymicrobial Bacteremia: Bacillus cereus, Staph hominis, Staph epi bacteremia   - acute, poses threat to life or bodily function  [high]  Staph hominis now in 3 separate peripheral blood cultures and 1 blood culture via PICC collected across ~13h on 2/18 and 2/19, clearly a true infection. B. cereus in 2/2 peripheral cultures from 2/18, did not grow on subsequent cx. Staph epi not in initial cultures but in 2 of 2 (peripheral, PICC) on 2/19, so these also likely represent true bacteremia. Both can be found in the GI tract and also (esp.the Staph) on skin and wounds, so an initial source of the enterocutaneous fistula or elsewhere in the GI tract is most likely, but suspect PICC was infected, so removed 09/25/21. We generally treat true bacteremia of each of these pathogens with at least 7 days of IV antibiotics, sometimes up to 14 days. If she were to remain bacteremic on subsequent cultures I would recommend a TTE to evaluate for endocarditis.   ??? Follow repeat blood cultures  to evaluate for clearance. Planned duration will be at least 7 days from first negative culture; tentatively 09/24/21-10/01/21    # Enterocutaneous fistula, abdominal wall abscess, possible peritonitis   - acute, poses threat to life or bodily function  [high]  S/p abscess drain placement by VIR 2/7 w/ repeat CT 2/19 and drain output suggestive that abscess has resolved or nearly resolved. Though, continues to have frank purulence on 09/26/21 exam-- which may represent residual abscess fluid or residual fistula. Perhaps most concerningly, has poor prognosis for bowel obstruction and for healing her enterocutaneous fistula, per gyn-onc, so will likely remain at high risk for persistent or new intraabdominal infections. Abdominal swab (taken by nursing on 2/19) does show anaerobes and candida dubliniensis. While this yeast is often a colonizing pathogen, in her context, this may be contributing to ongoing pathogenic infection. As such, would opt to cover this (routinely susceptible to fluconazole, but full susceptibilities will be run/made available around 10/03/21 per discussion with microbiology on 09/26/21).  - Peritoneal studies revealed monocytic predominance of cells (351 clumped nucleated cells, 2% neut) w/ SAAG 0.8 and protein 2.8 which can be consistent with underlying malignancy  -Cytology pending  -Tx: Vanco + cefepime + flagyl (anaerobes on abd swab 2/19) + fluconazole (added 2/23 due to ongoing drainage and c dubliniensis on swab 2/19). Tentatively would treat through 10/09/21    # AKI, 09/25/21  Noted 09/25/21, 0.62->1.87. Insults include possible hypovolemia, Vancomycin and contrast  -Recommend workup per primary team    # Leukopenia, stable   - acute, undx'd new problem w/uncertain prognosis  [mod]  ??? 2.9 on 2/20, down from baseline of 6-7, resolved the next day.  Continue to trend CBCs- would do with dif daily for now, given rash.     # forearm rash, resolved   - acute, undx'd new problem w/uncertain prognosis  [mod]  ??? Fairly small area, not typical drug rash appearance, improved on 2/21. Monitor.    # Management of prescription antimicrobials needing intensive toxicity monitoring  Metronidazole can cause rashes, dysgeusia (altered taste), peripheral neuropathy, and/or myelosuppression.  Quinolones can cause rashes, N/V/D, CNS effects, MSK effects, arrhythmias and/or QTc prolongation, glucose abnormalities, and/or neuropathies.  Vancomycin can cause back pain, various dermatological effects, thrombocytopenia, neutropenia, and/or nephrotoxicity.   ??? See recommendations in blue box above.    # Disposition   -Likely home once improved. Likely OPAT candidate if prolonged IV antibiotics were to be required. Antimicrobials & Other Medications   Vanc IV 2/18-  Flagyl 500 PO q  2/8-  Cefepime 2/22-  Fluconazole 2/23- current    Prior:  Levofloxacin 750 IV q 24 2/8 -     Immunomodulators   Dexamethasone taper 2/10-2/17, hydrocortisone/dexamethasone 2/18-present  - last chemo ~9 months ago    Current Medications as of 09/29/2021  Scheduled  PRN   Cefepime, 1 g, Q12H SCH  dexAMETHasone, 0.5 mg, Daily  docusate sodium, 100 mg, Daily  dronabinoL, 2.5 mg, Nightly  fentaNYL, 1 patch, Q72H  fluconazole (DIFLUCAN) INTRAVENOUS, 200 mg, Q24H  heparin, porcine (PF), 200 Units, Q MWF  flu vacc qs2022-23 6mos up(PF), 0.5 mL, During hospitalization  lidocaine, 1 patch, Daily  metroNIDAZOLE, 500 mg, Q8H SCH  OLANZapine, 2.5 mg, Daily  OLANZapine, 2.5 mg, Nightly  pantoprazole, 40 mg, Daily  polyethylene glycol, 17 g, Daily  potassium chloride in water, 20 mEq, Q2H  scopolamine, 1 patch, Q72H  senna, 2 tablet, Nightly      acetaminophen, 650 mg, Q6H PRN  bisacodyL, 10 mg, Daily PRN  calcium carbonate, 200 mg of elem calcium, TID PRN  metoclopramide, 10 mg, TID PRN  naloxone, 0.1 mg, Q5 Min PRN  ondansetron, 4 mg, Q6H PRN  simethicone, 80 mg, Q6H PRN  sorbitoL, 30 mL, Daily PRN           Physical Exam     Temp:  [36.6 ??C (97.9 ??F)-37 ??C (98.6 ??F)] 36.6 ??C (97.9 ??F)  Heart Rate:  [77-86] 86  Resp:  [16] 16  BP: (114-138)/(83-96) 138/96  MAP (mmHg):  [94-110] 110  SpO2:  [100 %] 100 %    Actual body weight: 74.6 kg (164 lb 7.4 oz)  Ideal body weight: 50.1 kg (110 lb 7.2 oz)  Adjusted ideal body weight: 59.9 kg (132 lb 0.9 oz)     CONST: Vital signs above. Non-toxic appearance, no acute distress  EYES: anicteric sclerae. EOMI.   ENMT: Normal appearance of external nose and ears. Dry MM.   NECK: supple, feeding tubeentering through L neck   ABD: soft,  ND, +BS, no tenderness to palpation. Bandaged. Small brownish staining seen on gauze covering fistula/abd abscess site. Scarred skin and JP drain exiting mid abdomen with completely clear fluid in bulb.  ZO:XWRU warm and dry.   NEURO: CN II-XII grossly inact. moving all 4 extremities  PSYCH: normal mood and affect. Fully alert and oriented.     Patient Lines/Drains/Airways Status       Active Active Lines, Drains, & Airways       Name Placement date Placement time Site Days    CVC Double Lumen 09/27/21 Tunneled Right Internal jugular 09/27/21  0900  Internal jugular  1  Closed/Suction Drain Left LUQ Bulb 10 Fr. 09/10/21  1104  LUQ  18    Open Drain 1 Anterior;Left Neck 09/06/21  --  Neck  23    Peripheral IV 09/27/21 Left Forearm 09/27/21  1207  Forearm  1                    Data for ID Decision Making  ( IDGENCONMDM )       Micro & Serological Data   ( RSLTMICRO  /  Dorice Lamas  /  00CXSRC  /  00CXRES  /  00CXSUSC )  2/21 bld cx x2: NGTD  2/19 abdominal swab (taken by nursing at bedside): Per discussion and review of plates with microbiology on 2/23, the predominant pathogen is candida dubliniensis w/ 1+ anaerobes. No other gram negatives seen. This isolate was referred to susceptibility testing 09/26/21 w/ plan to result by 10/03/21  2/19 bld cx x2: Staph hominis in 2/2 AND Staph epi in 2/2 (Mec A detected)  2/18 bld cx: Bacillus cereus (2/2), Staph hominis (2/2)  2/17 ucx neg  2/7 abd aspirate: gram: 2+ GPR. Cx: 4+ mixed GP/GN orgs, incl. 3+ Citrobacter freundii (R unasyn, aztreo, cefazolin, ceftaz, ctx, I zosyn)), 2+ PsA (pan suscep), 4+ Strep anginosus  1/28 HIV neg    Recent Studies  ( RISRSLT )  2/19 CT abd pelvis w/ IV [id number]:  1. Persistent dilatation of small bowel loops with several transition points compatible with complex small bowel obstruction with likely closed loop component. There is slight interval increase in enhancement of the affected bowel mucosa. Recommend correlation with lactate and/or surgical consultation for ischemia.     2. Scattered foci of gas in the anterior peritoneum. These do not definitively technique with the enterocutaneous fistula and may represent focal walled off perforation.     3. New enhancement of peritoneal ascites concerning for peritonitis.     4. Decreased size of gas and fluid containing collection in the left anterior abdominal wall arising from the known enterocutaneous fistula status post drain placement.    ADDENDUM:   (09/22/2021 8:26 PM)  On review, the following additional findings were noted:     Limited exam in the absence of oral contrast.      Redemonstration of a complex enterocutaneous fistula with associated soft tissue stranding at the level of the anterior midline abdomen. There has been interval placement of a pigtail drainage catheter within the previously-described air and fluid collection along the left lower anterior abdominal wall, which is significantly decreased in size when compared to prior exam.     Numerous persistently dilated loops of small bowel are seen throughout the abdomen and pelvis, which demonstrate air-fluid levels and measure up to 3.7 cm in diameter. There is similar tethering of the small bowel along the anterior peritoneum with abrupt collapse at this level, thought likely to reflect a transition point (2:72). The distal small bowel appears mostly decompressed to the level of the cecum. These findings remain worrisome for small bowel obstruction.      Low attenuation abdominopelvic ascites (~18 HU) and diffuse peritoneal thickening/enhancement is similar to prior exam. While these findings, could potentially be seen in the setting of peritonitis, could also be seen with diffuse peritoneal disease spread and associated malignant ascites. Recommend clinical correlation.     Elsewhere throughout the abdomen multiple loops of small demonstrate short segments of wall thickening, hyperenhancement, and luminal narrowing (2:63; 2:94), which may reflect sites of serosal involvement and  associated stricturing        Initial Consult Documentation from September 23, 2021     Sources of information include: chart review, patient and treating providers.    History of Present Illness:     Ms. Butikofer was admitted to [facility] 2/17 with persistent hypoglycemia to 30s despite holding long acting insulin. This started a few days  after discharge home 2/10 from her last admission. Reports she experienced periods of lightheadness, decreased peripheral vision and is told she had slurred speech during these episodes, during which her glucose was found to be low. She would also often feel hot or chilled when her sugar was low, not oat other times. No rigors.     Reports full adherence to levo/flagyl she was taking  for abdominal abscess s/p VIR drainage 2/7. She reports that either as soon as or within a day or two after going home the output into her abdominal/fistula site JP drain stopped, with no drainage (other than the fluid she flushes in) since. She has had very scant greenish drainage from the fistula/abscess exit site area- enough to stain overlying dressing but not enough to soak through or really be visible on the skin. Reports her abdominal pain is unchanged.     Blood cultures were drawn, which grew Bacillus and a coag negative staph in both peripheral blood cultures. She has remained afebrile without infectious symptoms. After initial admission to the MICU, transferred to the floor.     When blood cultures grew, vancomycin was added.     Today, she noticed a R forearm rash with a few dozen small, pinpoint red macules. It is not itchy or painful. No rash or lesions she has noticed elsewhere.     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 No definitive succus seen intra-op. Treated conservatively with bowel rest and TPN for 1 month then had several I&D of recollected abscess at site of EC fistula with drain placed and left until clinical and imaging evidence of resolution    -12/29-08/07/21 admission for partial SBO and abdominal wall fluid collection, biopsy of fluid collection consistent at first for upper GI or pancreatobiliary primary   - Last imaging: CTAP - 09/09/2021 with SBO, EC fistula and likely peritoneal carcinomatosis    --recurrent SBO 08/2021  --EC fistula and abd wall bowel abscess 09/2021  -IDDM2  -poor nutrition  -anemia of chronic dz  -chronic pain on fentanyl patches    Meds and Allergies  Patient has a current medication list which includes the following prescription(s): acetaminophen, bisacodyl, calcium carbonate, cyclophosphamide, docusate sodium, famotidine, fentanyl, glucose, insulin lispro, insulin nph, lidocaine, metoclopramide, olanzapine, olanzapine, omeprazole, polyethylene glycol, prochlorperazine, scopolamine, senna, simethicone, sodium chloride, and [DISCONTINUED] fentanyl, and the following Facility-Administered Medications: acetaminophen, Adult Parenteral Nutrition **AND** fat emulsion 20 % with fish oil, [START ON 09/30/2021] Adult Parenteral Nutrition **AND** [START ON 09/30/2021] fat emulsion 20 % with fish oil, bisacodyl, calcium carbonate, cefepime (MAXIPIME) 1 g in sodium chloride 0.9 % (NS) 100 mL IVPB-connector  bag, [EXPIRED] dexamethasone **FOLLOWED BY** dexamethasone, docusate sodium, dronabinol, fentanyl, fluconazole, heparin, porcine (pf), flu vacc qs2022-23 6mos up(pf), lidocaine, metoclopramide, metronidazole, naloxone, olanzapine, olanzapine, ondansetron, pantoprazole, polyethylene glycol, potassium chloride in water, scopolamine, senna, simethicone, sorbitol.    Allergies: Carboplatin and Ketamine       Social History  Patient  reports that she has never smoked. She has never used smokeless tobacco. Lives at home.

## 2021-09-29 NOTE — Unmapped (Signed)
Hospital Medicine Daily Progress Note    Assessment/Plan:    Principal Problem:    Hypoglycemia  Active Problems:    Ovarian cancer (CMS-HCC)    Type 2 diabetes mellitus (CMS-HCC)    Sick-euthyroid syndrome    Enterocutaneous fistula    Abscess    Poor nutrition    Anemia of chronic disease  Resolved Problems:    * No resolved hospital problems. *                Colleen Russo is a 46 y.o. 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 Hospital course has been complicated by AKI and polymicrobial bacteremia.     Enterocutaneous fistula - Abdominal wall abscess - Hx intra-abdominal abscesses: Most recently s/p abscess drain placement by [facility] 2/7; was treated with Zosyn --> levofloxacin/Flagyl on discharge through 09/22/21. CT A/P on admission with reduced abdominal wall fluid collection after drain placement. VIR recommended leaving drain in place until [facility] fistula has resolved, despite low drain output, as they expect the fluid collection will return. Ascitic fluid studies with few neutrophils reassuring against bacterial peritonitis, and peritoneal culture no growth. She has continued to have some cutaneous drainage, and swab from site has grown mixed anaerobes and Candida dubliniensis.  - Continue cefepime IV and metronidazole IV, anticipate 4 week course from drain placement (2/7 - 10/09/21)   - Continue fluconazole IV for Candida, anticipate 10-14 day course (2/23 - 10/09/21)    AKI, not POA, improving: Baseline Cr 0.4; Cr increased 0.6>1.87>2.59 by 2/23. Suspect due to vancomycin nephrotoxicity (level peaked at 44 on 2/22) and/or contrast-induced nephropathy (received IV contrast 09/22/21). Creatinine improving to 1.8 today but only 600 ml urine output and she c/o painful peripheral edema today.   - Lasix 80 mg IV x 1 after shared decision making with patient  - Daily BMP   - Avoid nephrotoxins and renally dose meds    Peripheral edema: Likely multifactorial due to fluid retention i/s/o resolving AKI and third spacing from increased vascular permeability with cancer and decreased oncotic pressure with hypoalbuminemia. Reassuringly, no signs of pulmonary edema or respiratory compromise.   - Lasix 80 mg IV x 1    Hypokalemia: possible due to chronic GI losses vs acute renal losses in setting of resolving AKI  - KCl 40 mEq x 1 today    Malnutrition secondary to chronic bowel obstruction requiring TPN: will need to continue TPN while on bowel rest for management of chronic SBO and EC fistula. PICC line removed 2/22 and new tunneled line placed 09/27/21 by [facility]. TPN restarted 2/24 and patient currently tolerating well without electrolyte abnormalities on labs.   - Continue TPN  ??  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 Last reported insulin administration was 2/16. Initial hypoglycemia labs consistent with insulin-dependent process, either exogenous or endogenous. Hypoglycemia has resolved with discontinuation of insulin and with dextrose-containing fluids and parenteral nutrition. Suspect the hyperglycemia that prompted insulin initiation during last admission was steroid-induced. With steroid taper, glucoses currently at goal; not hypoglycemic nor >250.  - Appreciate Endocrinology recommendations  - Continue to hold insulin unless glucose >250, at which point would start very relaxed correctional insulin  - If she does require insulin on discharge, may benefit from insulin being added to TPN given concern for patient mixing up insulins at home contributing to hypoglycemia    Polymicrobial bacteremia, resolved: Peripheral BCx 2/18 grew B cereus and Staph hominis. Repeat peripheral and central line BCx 2/19 grew Staph epi and Staph hominis. Repeat BCx 2/21 no growth. Bacteremia most likely due to translocation from [facility] fistula vs central line-associated (had PICC for TPN at time of bacteremia). Now s/p PICC removal 2/22 and new central line placement 2/24. Treated with IV vancomycin, complicated by supratherapeutic levels and AKI. Vancomycin has been held since 2/21 due to elevated levels but remains therapeutic, with level 17 today 2/25. Per ID, CoNS can be treated with 7 days of IV antibiotics.   - Appreciate ID recommendations  - s/p vancomycin x 7 days (2/18-2/24/23)    FEN: NPO for bowel rest (sips/chips for comfort); G-tube venting; TPN  VTE ppx: Lovenox  Code Status: Full  Disposition: EDD 2/27-3/1 with home infusion for TPN and antibiotics pending setting up home services and stable TPN formulation    I personally spent 50 minutes face-to-face and non-face-to-face in the care of this patient, which includes all pre, intra, and post visit time on the date of service.  All documented time was specific to the E/M visit and does not include any procedures that may have been performed.  ___________________________________________________________________    Subjective:  No acute events overnight. Having some more nausea today but feels this is her chronic waxing and waning nausea. She liked the new dronabinol last night and feels that it improved her pain. Denies excess somnolence or confusion. Her main concern is peripheral edema in her legs up to her hips despite elevating her legs and ambulating. The edema is painful. We discussed risk/benefits of IV diuretic (namely, potential to worsen or delay recovery from AKI), and she wants to give the diuretic a try. She denies dyspnea or cough.     Labs/Studies:  Labs per EMR and Reviewed (last 24hrs)    Objective:  Temp:  [36.5 ??C (97.7 ??F)-37 ??C (98.6 ??F)] 36.5 ??C (97.7 ??F)  Heart Rate:  [77-86] 81  Resp:  [16] 16  BP: (119-138)/(83-108) 133/108  SpO2:  [100 %] 100 %    Gen: Non-toxic-appearing, sitting up in bed, in NAD, sister at bedside  Eyes: EOMI, conjunctivae clear  ENT: MMM. TEG in place draining small amount of gastric contents. Powerline in place in R anterior chest, insertion site c/d/i, sterile dressing intact  CV: RRR, nl S1 S2, no m/r/g  Pulm: CTAB, nl WOB on RA, no wheezes or crackles  Abd: Soft, NTND. Minimal clear output in JP drain.  Ext: WWP. Compression stockings on BLE. Trace pitting edema to hips in BLE.   Skin: No rashes or lesions  Neuro: A&O x 3, no focal deficits  Psych: Appropriate affect

## 2021-09-29 NOTE — Unmapped (Signed)
Pt rested well overnight with no c/o's, VSS. IV abx and TPN continued as ordered. Blood sugars have remained stable. Pt strictly NPO except for medications. No c/o's N/V overnight. Pt is experiencing worsening BLE. Strict I/O monitoring in place. Will cont to monitor closely.  Problem: Adult Inpatient Plan of Care  Goal: Plan of Care Review  Outcome: Progressing  Goal: Patient-Specific Goal (Individualized)  Outcome: Progressing  Goal: Absence of Hospital-Acquired Illness or Injury  Outcome: Progressing  Intervention: Identify and Manage Fall Risk  Recent Flowsheet Documentation  Taken 09/28/2021 2000 by Joan Flores, RN  Safety Interventions:   infection management   lighting adjusted for tasks/safety   low bed   nonskid shoes/slippers when out of bed  Intervention: Prevent and Manage VTE (Venous Thromboembolism) Risk  Recent Flowsheet Documentation  Taken 09/28/2021 2000 by Joan Flores, RN  Activity Management: sitting, edge of bed  Intervention: Prevent Infection  Recent Flowsheet Documentation  Taken 09/28/2021 2000 by Joan Flores, RN  Infection Prevention:   environmental surveillance performed   rest/sleep promoted   single patient room provided  Goal: Optimal Comfort and Wellbeing  Outcome: Progressing  Goal: Readiness for Transition of Care  Outcome: Progressing     Problem: Self-Care Deficit  Goal: Improved Ability to Complete Activities of Daily Living  Outcome: Progressing     Problem: Skin Injury Risk Increased  Goal: Skin Health and Integrity  Outcome: Progressing     Problem: Pain Chronic (Persistent) (Comorbidity Management)  Goal: Acceptable Pain Control and Functional Ability  Outcome: Progressing

## 2021-09-30 DIAGNOSIS — C569 Malignant neoplasm of unspecified ovary: Principal | ICD-10-CM

## 2021-09-30 LAB — COMPREHENSIVE METABOLIC PANEL
ALBUMIN: 2.4 g/dL — ABNORMAL LOW (ref 3.4–5.0)
ALKALINE PHOSPHATASE: 55 U/L (ref 46–116)
ALT (SGPT): <7 U/L — ABNORMAL LOW (ref 10–49)
ANION GAP: 10 mmol/L (ref 5–14)
AST (SGOT): 15 U/L (ref <=34)
BILIRUBIN TOTAL: <0.2 mg/dL — ABNORMAL LOW (ref 0.3–1.2)
BLOOD UREA NITROGEN: 31 mg/dL — ABNORMAL HIGH (ref 9–23)
BUN / CREAT RATIO: 22
CALCIUM: 8.8 mg/dL (ref 8.7–10.4)
CHLORIDE: 102 mmol/L (ref 98–107)
CO2: 34 mmol/L — ABNORMAL HIGH (ref 20.0–31.0)
CREATININE: 1.39 mg/dL — ABNORMAL HIGH
EGFR CKD-EPI (2021) FEMALE: 48 mL/min/{1.73_m2} — ABNORMAL LOW (ref >=60)
GLUCOSE RANDOM: 139 mg/dL (ref 70–179)
POTASSIUM: 3.1 mmol/L — ABNORMAL LOW (ref 3.4–4.8)
PROTEIN TOTAL: 5.5 g/dL — ABNORMAL LOW (ref 5.7–8.2)
SODIUM: 146 mmol/L — ABNORMAL HIGH (ref 135–145)

## 2021-09-30 LAB — CBC W/ AUTO DIFF
BASOPHILS ABSOLUTE COUNT: 0 10*9/L (ref 0.0–0.1)
BASOPHILS RELATIVE PERCENT: 0.4 %
EOSINOPHILS ABSOLUTE COUNT: 0.1 10*9/L (ref 0.0–0.5)
EOSINOPHILS RELATIVE PERCENT: 0.7 %
HEMATOCRIT: 27.9 % — ABNORMAL LOW (ref 34.0–44.0)
HEMOGLOBIN: 9.6 g/dL — ABNORMAL LOW (ref 11.3–14.9)
LYMPHOCYTES ABSOLUTE COUNT: 1.3 10*9/L (ref 1.1–3.6)
LYMPHOCYTES RELATIVE PERCENT: 15 %
MEAN CORPUSCULAR HEMOGLOBIN CONC: 34.6 g/dL (ref 32.0–36.0)
MEAN CORPUSCULAR HEMOGLOBIN: 28.3 pg (ref 25.9–32.4)
MEAN CORPUSCULAR VOLUME: 82 fL (ref 77.6–95.7)
MEAN PLATELET VOLUME: 7.3 fL (ref 6.8–10.7)
MONOCYTES ABSOLUTE COUNT: 1 10*9/L — ABNORMAL HIGH (ref 0.3–0.8)
MONOCYTES RELATIVE PERCENT: 11.7 %
NEUTROPHILS ABSOLUTE COUNT: 6.1 10*9/L (ref 1.8–7.8)
NEUTROPHILS RELATIVE PERCENT: 72.2 %
PLATELET COUNT: 252 10*9/L (ref 150–450)
RED BLOOD CELL COUNT: 3.4 10*12/L — ABNORMAL LOW (ref 3.95–5.13)
RED CELL DISTRIBUTION WIDTH: 15.9 % — ABNORMAL HIGH (ref 12.2–15.2)
WBC ADJUSTED: 8.4 10*9/L (ref 3.6–11.2)

## 2021-09-30 LAB — PHOSPHORUS: PHOSPHORUS: 4.3 mg/dL (ref 2.4–5.1)

## 2021-09-30 LAB — MAGNESIUM: MAGNESIUM: 1.8 mg/dL (ref 1.6–2.6)

## 2021-09-30 MED ADMIN — potassium chloride 20 mEq in 100 mL IVPB Premix: 20 meq | INTRAVENOUS | @ 17:00:00 | Stop: 2021-09-30

## 2021-09-30 MED ADMIN — fat emulsion 20 % with fish oil (SMOFLIPID) infusion 250 mL: 250 mL | INTRAVENOUS | @ 05:00:00 | Stop: 2021-09-30

## 2021-09-30 MED ADMIN — metroNIDAZOLE (FLAGYL) IVPB 500 mg: 500 mg | INTRAVENOUS | @ 03:00:00 | Stop: 2021-10-09

## 2021-09-30 MED ADMIN — metroNIDAZOLE (FLAGYL) IVPB 500 mg: 500 mg | INTRAVENOUS | @ 20:00:00 | Stop: 2021-10-09

## 2021-09-30 MED ADMIN — fentaNYL (DURAGESIC) 50 mcg/hr 1 patch: 1 | TRANSDERMAL | @ 05:00:00 | Stop: 2021-10-08

## 2021-09-30 MED ADMIN — Adult Parenteral Nutrition: INTRAVENOUS | @ 05:00:00 | Stop: 2021-09-30

## 2021-09-30 MED ADMIN — cefepime (MAXIPIME) 1 g in sodium chloride 0.9 % (NS) 100 mL IVPB-connector bag: 1 g | INTRAVENOUS | @ 14:00:00 | Stop: 2021-09-30

## 2021-09-30 MED ADMIN — cefepime (MAXIPIME) 1 g in sodium chloride 0.9 % (NS) 100 mL IVPB-connector bag: 1 g | INTRAVENOUS | @ 01:00:00 | Stop: 2021-10-09

## 2021-09-30 MED ADMIN — dronabinoL (MARINOL) capsule 2.5 mg: 2.5 mg | ORAL | @ 01:00:00

## 2021-09-30 MED ADMIN — pantoprazole (PROTONIX) EC tablet 40 mg: 40 mg | ORAL | @ 14:00:00

## 2021-09-30 MED ADMIN — docusate sodium (COLACE) capsule 100 mg: 100 mg | ORAL | @ 14:00:00

## 2021-09-30 MED ADMIN — dexAMETHasone (DECADRON) tablet 0.5 mg: .5 mg | ORAL | @ 15:00:00 | Stop: 2021-10-06

## 2021-09-30 MED ADMIN — calcium carbonate (TUMS) chewable tablet 200 mg of elem calcium: 200 mg | ORAL | @ 20:00:00

## 2021-09-30 MED ADMIN — dronabinoL (MARINOL) capsule 5 mg: 5 mg | ORAL

## 2021-09-30 MED ADMIN — simethicone (MYLICON) chewable tablet 80 mg: 80 mg | ORAL | @ 16:00:00

## 2021-09-30 MED ADMIN — metroNIDAZOLE (FLAGYL) IVPB 500 mg: 500 mg | INTRAVENOUS | @ 10:00:00 | Stop: 2021-10-09

## 2021-09-30 MED ADMIN — ondansetron (ZOFRAN) injection 4 mg: 4 mg | INTRAVENOUS | @ 16:00:00

## 2021-09-30 NOTE — Unmapped (Signed)
Hospital Medicine Daily Progress Note    Assessment/Plan:    Principal Problem:    Hypoglycemia  Active Problems:    Ovarian cancer (CMS-HCC)    Type 2 diabetes mellitus (CMS-HCC)    Sick-euthyroid syndrome    Enterocutaneous fistula    Abscess    Poor nutrition    Anemia of chronic disease  Resolved Problems:    * No resolved hospital problems. *                Colleen Russo is a 46 y.o. 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 Hospital course has been complicated by AKI and polymicrobial bacteremia.     Enterocutaneous fistula - Abdominal wall abscess - Hx intra-abdominal abscesses: Most recently s/p abscess drain placement by [facility] 2/7; was treated with Zosyn --> levofloxacin/Flagyl on discharge through 09/22/21. CT A/P on admission with reduced abdominal wall fluid collection after drain placement. VIR recommended leaving drain in place until [facility] fistula has resolved, despite low drain output, as they expect the fluid collection will return. Ascitic fluid studies with few neutrophils reassuring against bacterial peritonitis, and peritoneal culture no growth. She has continued to have some cutaneous drainage, and swab from site has grown mixed anaerobes and Candida dubliniensis.  - Appreciate ID recommendations  - Appreciate CM assistance with home infusion  - Continue cefepime IV and metronidazole IV, anticipate 4 week course from drain placement (2/7 - 10/09/21)   - Continue fluconazole IV for Candida, anticipate 10-14 day course (2/23 - 10/09/21)  - EKG to monitor QTc today    AKI, not POA, improving: Baseline Cr 0.4; Cr increased 0.6>1.87>2.59 by 2/23. Suspect due to vancomycin nephrotoxicity (level peaked at 44 on 2/22) and/or contrast-induced nephropathy (received IV contrast 09/22/21). Creatinine improving to 1.39 today despite IV Lasix yesterday, although BUN is uptrending 16>29>31.   - Hold off on further diuresis for now  - Daily BMP   - Avoid nephrotoxins and renally dose meds    Hypokalemia: likely due to chronic GI losses from venting G-tube and renal losses from resolving AKI and aggressive diuresis yesterday  - KCl 40 mEq x 1 today    Peripheral edema, improved: Likely multifactorial due to fluid retention i/s/o resolving AKI and third spacing from increased vascular permeability with cancer and decreased oncotic pressure with hypoalbuminemia. Reassuringly, no signs of pulmonary edema or respiratory compromise. Responded very well to Lasix 80 IV x 1 on 2/26.   - Hold off on further diuresis for now  ??  Malnutrition secondary to chronic bowel obstruction requiring TPN - Chronic small bowel obstruction??- TEG: will need to continue TPN while on bowel rest for management of chronic SBO and EC fistula. PICC line removed 2/22 and new tunneled line placed 09/27/21 by [facility]IR. TPN restarted 2/24 and patient currently tolerating well. Has had some hypokalemia last 2 days, possibly due to resolving AKI, but other electrolytes wnl.   - Appreciate Gynecology Oncology and Surgical Oncology evaluations; no plans for surgery currently  - NPO except sips/chips for comfort for bowel rest  - Continue TPN  - Continue home scopolamine patch  - Reglan PRN  - Increase dronabinol to 5 mg nightly per patient request  - Decreased Zyprexa to 2.5 mg at bedtime only per patient request  - Daily BMP/Mg/Phos    Recurrent stage III clear cell carcinoma  of the ovary with peritoneal carcinomatosis: Primary Oncologist: [name]  - Appreciate Gyn/Onc recommendations  - Continue dexamethasone taper through 10/05/21; no long-term indication per Gyn Onc  - Outpatient follow-up with Gyn Onc    Chronic??pain: currently well-controlled. Patient requesting to transition off of opioids and increase dronabinol for non-opioid pain relief.   - Discontinue fentanyl transdermal per patient preference and will monitor pain; may need to resume if pain increases signficantly  - Continue lidocaine patch, and??Tylenol PRN  - Increase dronabinol to 5 at bedtime per patient request (previously on this dose in 08/2021)    Severe hypoglycemia, resolved: presented with blood glucose <55 with hypoglycemic symptoms in setting of home insulin use while on TPN. Last reported insulin administration was 2/16. Initial hypoglycemia labs consistent with insulin-dependent process, either exogenous or endogenous. Hypoglycemia has resolved with discontinuation of insulin and with dextrose-containing fluids and parenteral nutrition. Suspect the hyperglycemia that prompted insulin initiation during last admission was steroid-induced. With steroid taper, glucoses currently at goal without significant hyper- or hypo-glycemia.   - Appreciate Endocrinology recommendations  - Continue to hold insulin unless glucose >250, at which point would start very relaxed correctional insulin  - If she does require insulin on discharge, may benefit from insulin being added to TPN given concern for patient mixing up insulins at home contributing to hypoglycemia    Polymicrobial bacteremia, resolved: Peripheral BCx 2/18 grew B cereus and Staph hominis. Repeat peripheral and central line BCx 2/19 grew Staph epi and Staph hominis. Repeat BCx 2/21 no growth. Bacteremia most likely due to translocation from [facility] fistula vs central line-associated (had PICC for TPN at time of bacteremia). Now s/p PICC removal 2/22 and new central line placement 2/24. Treated with IV vancomycin, complicated by supratherapeutic levels and AKI. Vancomycin has been held since 2/21 due to elevated levels but remains therapeutic, with level 17 today 2/25. Per ID, CoNS can be treated with 7 days of IV antibiotics.   - Appreciate ID recommendations  - s/p vancomycin x 7 days (2/18-2/24/23)    FEN: NPO for bowel rest (sips/chips for comfort); G-tube venting; TPN  VTE ppx: Lovenox  Code Status: Full  Disposition: EDD 3/1 with home infusion for TPN and antibiotics pending TPN cycling and setting up home services    I personally spent 50 minutes face-to-face and non-face-to-face in the care of this patient, which includes all pre, intra, and post visit time on the date of service.  All documented time was specific to the E/M visit and does not include any procedures that may have been performed.  ___________________________________________________________________    Subjective:  No acute events overnight. She had excellent output to Lasix yesterday and feels like her swelling is significantly improved. She is in good spirits and planning ahead to discharge, asking about discharge medications. Her pain is well-controlled and she wants to try stopping her fentanyl patch (says she does not use at home) and wants to increase the dronabinol for pain instead. Per nursing, patient had episode of emesis after taking her oral dexamethasone.     Labs/Studies:  Labs per EMR and Reviewed (last 24hrs)    Objective:  Temp:  [36.7 ??C (98.1 ??F)-37 ??C (98.6 ??F)] 37 ??C (98.6 ??F)  Heart Rate:  [78-92] 92  Resp:  [18-19] 19  BP: (118-130)/(85-89) 119/88  SpO2:  [92 %-99 %] 92 %    Gen: Non-toxic-appearing, sitting up in bed, in NAD  Eyes: EOMI, conjunctivae clear  ENT: MMM. TEG in place draining small  amount of gastric contents. Powerline in place in R anterior chest, insertion site c/d/i, sterile dressing intact  CV: RRR, nl S1 S2, no m/r/g  Pulm: CTAB, nl WOB on RA, no wheezes or crackles  Abd: Soft, NTND. Minimal clear output in JP drain. Stable EC fistula appearance with minimal drainage of dark brown material onto overlying dressing, no expressible purulence or fluctuance  Ext: WWP. Compression stockings on BLE. Interval improvement in peripheral edema in BLE, now trace to none  Skin: No rashes or lesions  Neuro: A&O x 3, no focal deficits  Psych: Appropriate affect

## 2021-09-30 NOTE — Unmapped (Signed)
GYN ONC PROGRESS NOTE    Assessment and Plan:  Colleen Russo is a 46 y.o. 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 Now s/p PICC removal replacement with IJ. Remains with benign abdominal exam, no concern for ischemia in the setting of her chronic obstruction. Hypoglycemia now resolved on TPN. Continues on IV antibiotics and fungal coverage for bacteremia likely intraabdominal/cutaneous etiology. Appreciate management by Medicine team. Working towards dispo planning.    Onc: Recurrent Clear Cell Carcinoma of the Ovary   - Primary Oncologist: [name]  - s/p XL??with??radical tumor debulking including??total hysterectomy,??bilateral salpingo-oophorectomy, infra-gastric??omentectomy, and??washings??on 07/21/2019  -??s/p 6 cycles carbo/taxol, completed 12/05/19  - CT 2/022 concerning for progression, started on carbo/gem 2/22-5/24/22.  - CT 12/2020 stable disease  - 08/2021 recurrent disease, abdominal mass bx  - 09/2021 recurrent SBO, likely closed loop c/b intraabdominal infection with JP drain placed 2/7  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  - Chemo plan: cyclophosphamide, keytruda after completion of abx  - Last CA-125:??180 on 09/06/2021  - Our team will facilitate follow up upon discharge.     Refractory Hypoglycemia, resolved - T2DM   - Discharged 2/9 on NPH 20 units daily to take 1 hr prior to TPN > attempted to decrease as an outpatient and ultimately discontinued given symptomatic lows, last took subq insulin 2/16   - Per Endocrine, unclear etiology  - Management per Primary Team, Endocrine following, appreciate recommendations; signed off   - No longer on dextrose IV after PN starts   - Dexamethasone taper per Medicine   - No indication for longterm steroids from GynOnc perspective  - BG at goal, currently not requiring insulin in TPN    Enterocutaneous Fistula - Hx abscesses - Bacteremia, new abdominal ascites  - History of enterocutaneous fistula,??and abscesses requiring drainage.  - Most recently, abscess drain placement by VIR 2/7, initially on Zosyn > s/p Levofloxacin and Flagyl   - BCx positive   - 2/19 Staph epi, S. hominis, MRSA, Bacillus cereus   - ID consulted likely component of PICC colonization and intraabdominal source   - 2/21 Repeat BCx NGTD  - 2/19 Abdominal/cutaneous Cx; 1+ Candida dubliniensis> Fluconazole (2/23- )  - 2/19 repeat CTAP with redemonstration of ECF, decreased air and fluid collection along left lower anterior abdominal wall which is significantly decreased s/p VIR drain 2/7 making new abdominal abscess less likely, however bowel edema with possible ischemic component  - 2/21 VIR paracentesis 150cc amber fluid   - Culture NGTD   - Cytology pending   - Protein 2.8, Glucose 69, Albumin 1.6   - Micro most consistent with intraabdominal and cutaneous etiology of bacteremia  - Abdominal exam remains benign, patient afebrile and clinically well appearing without concerns for peritonitis  - Plan for: Total 4 weeks of  IV Cefepime/Flagyl/Fluconazole, end 3/8    Small bowel obstruction - TEG  - s/p TEG placement on 2/3  - Home reg: scopolamine patch, using Reglan and Compazine PRN  - 2/19 CTAP persistent small bowel obstruction, likely closed loop component with slight interval increase in enhancement of affected bowel mucosa  - 2/20 Colorectal surgery cs: agree ECF does not appear to be the source of bacteremia given drainage of previously existing fluid collections  - Remains with a benign abdominal exam  ??  Sick Euthyroid Syndrome  - Last admit TSH 0.237, FT4 0.54  ????  Chronic Pain  - S/p Palliative Care consult, pain well controlled on 50 mcg fentanyl patches and Tylenol PRN  ??  Poor Nutrition  - S/p Nutrition consult 2/18  - Continue TPN  - Sips for comfort only Anemia of Chronic Disease, stable  - Admit Hgb 7.9 stable from prior  ??  PPX: Lovenox  ??  Code Status: full code  ??  Disposition: floor    Patient seen and plan discussed with Dr. Merry Proud and Attending Dr. Ruthe Mannan immediately available.    Subjective:  NAEON, no concerns. Denies any pain or nausea. She has been ambulating. Notes improved swelling in her extremities. She hopes to go home soon.     Objective:  Temp:  [36.5 ??C (97.7 ??F)-36.8 ??C (98.2 ??F)] 36.7 ??C (98.1 ??F)  Heart Rate:  [78-81] 78  Resp:  [16-18] 18  BP: (118-133)/(85-108) 118/85  SpO2:  [98 %-100 %] 99 %    -General:                    Well-appearing in NAD.  -CV:                             Regular rate and rhythm  -Chest:                    Normal WOB.   -Abd:                           Non-tender, non-distended abdomen.  ECF dressing clean/dry/intact. TEG draining bilious outoput, abdominal JP drain without output  -Extremities:              Non pitting edema bilaterally, symmetric and nontender  -Psych:                       Appropriate.The patient is awake and alert.    Medications (scheduled)   ??? Cefepime  1 g Intravenous Q12H [facility]   ??? dexAMETHasone  0.5 mg Oral Daily   ??? docusate sodium  100 mg Oral Daily   ??? dronabinoL  2.5 mg Oral Nightly   ??? fentaNYL  1 patch Transdermal Q72H   ??? fluconazole (DIFLUCAN) INTRAVENOUS  200 mg Intravenous Q24H   ??? heparin, porcine (PF)  200 Units Intravenous Q MWF   ??? flu vacc qs2022-23 6mos up(PF)  0.5 mL Intramuscular During hospitalization   ??? lidocaine  1 patch Transdermal Daily   ??? metroNIDAZOLE  500 mg Intravenous [facility]   ??? OLANZapine  2.5 mg Oral Nightly   ??? pantoprazole  40 mg Oral Daily   ??? polyethylene glycol  17 g Oral Daily   ??? scopolamine  1 patch Topical Q72H   ??? senna  2 tablet Oral Nightly       Medications (prn)  acetaminophen, bisacodyL, calcium carbonate, metoclopramide, naloxone, ondansetron, simethicone, sorbitoL    Labs:   Lab Results   Component Value Date    WBC 8.0 09/29/2021    HGB 9.0 (L) 09/29/2021    HCT 26.0 (L) 09/29/2021    PLT 254 09/29/2021       Lab Results   Component Value Date    NA 143 09/29/2021    K 3.3 (L) 09/29/2021    CL 106 09/29/2021    CO2 29.0  09/29/2021    BUN 29 (H) 09/29/2021    CREATININE 1.84 (H) 09/29/2021    GLU 157 09/29/2021    CALCIUM 8.6 (L) 09/29/2021    MG 1.9 09/29/2021    PHOS 3.9 09/29/2021       Lab Results   Component Value Date    BILITOT 0.2 (L) 09/29/2021    BILIDIR 0.10 09/11/2021    PROT 5.2 (L) 09/29/2021    ALBUMIN 2.4 (L) 09/29/2021    ALT <7 (L) 09/29/2021    AST 17 09/29/2021    ALKPHOS 54 09/29/2021       Lab Results   Component Value Date    INR 1.14 09/21/2021    APTT 30.7 09/21/2021     Scribe's Attestation: Lance Muss, MD obtained and performed the history, physical exam and medical decision making elements that were  entered into the chart. Documentation assistance was provided by me personally, a scribe. Signed by Tinnie Gens Scribe, on September 30, 2021 at 5:37 AM.         ----------------------------------------------------------------------------------------------------------------------  September 30, 2021 9:38 AM. Documentation assistance provided by the Scribe. I was present during the time the encounter was recorded. The information recorded by the Scribe was done at my direction and has been reviewed and validated by me.  ----------------------------------------------------------------------------------------------------------------------

## 2021-09-30 NOTE — Unmapped (Signed)
Pt alert and oriented, manages her drains and lines with great proficiencty. Keeps track of urine output. Small sips of applejuice, sometimes coffee , mostly for taste. She isn't taking in a significant amount. She ambulates to bathroom and using commode after lasix. She put out almost 3L of fluid by the end of the day. She had her sister assisting her today at bedside. Wearing compression stockings. Vitals stable, no chills, no cough, no BM. Last BM 2./25. Will continue with plan of care.   Problem: Adult Inpatient Plan of Care  Goal: Plan of Care Review  Outcome: Progressing  Goal: Patient-Specific Goal (Individualized)  Outcome: Progressing  Flowsheets (Taken 09/29/2021 1921)  Patient-Specific Goals (Include Timeframe): Pt will tolerate ambulating in the hallway x2 during shift by 2/27  Goal: Absence of Hospital-Acquired Illness or Injury  Outcome: Progressing  Intervention: Prevent Infection  Recent Flowsheet Documentation  Taken 09/29/2021 0800 by Levester Fresh, RN  Infection Prevention: rest/sleep promoted  Goal: Optimal Comfort and Wellbeing  Outcome: Progressing  Goal: Readiness for Transition of Care  Outcome: Progressing  Goal: Rounds/Family Conference  Outcome: Progressing     Problem: Self-Care Deficit  Goal: Improved Ability to Complete Activities of Daily Living  Outcome: Progressing     Problem: Skin Injury Risk Increased  Goal: Skin Health and Integrity  Outcome: Progressing  Intervention: Optimize Skin Protection  Recent Flowsheet Documentation  Taken 09/29/2021 0800 by Levester Fresh, RN  Pressure Reduction Techniques: frequent weight shift encouraged     Problem: Heart Failure Comorbidity  Goal: Maintenance of Heart Failure Symptom Control  Outcome: Progressing     Problem: Hypertension Comorbidity  Goal: Blood Pressure in Desired Range  Outcome: Progressing     Problem: Pain Chronic (Persistent) (Comorbidity Management)  Goal: Acceptable Pain Control and Functional Ability  Outcome: Progressing

## 2021-09-30 NOTE — Unmapped (Signed)
Patient denies any pain, VSS.  She remained stable and no acute events noted overnight  Problem: Adult Inpatient Plan of Care  Goal: Plan of Care Review  Outcome: Ongoing - Unchanged  Goal: Patient-Specific Goal (Individualized)  Outcome: Ongoing - Unchanged  Goal: Absence of Hospital-Acquired Illness or Injury  Outcome: Ongoing - Unchanged  Goal: Optimal Comfort and Wellbeing  Outcome: Ongoing - Unchanged  Goal: Readiness for Transition of Care  Outcome: Ongoing - Unchanged  Goal: Rounds/Family Conference  Outcome: Ongoing - Unchanged     Problem: Self-Care Deficit  Goal: Improved Ability to Complete Activities of Daily Living  Outcome: Progressing     Problem: Skin Injury Risk Increased  Goal: Skin Health and Integrity  Outcome: Ongoing - Unchanged     Problem: Pain Chronic (Persistent) (Comorbidity Management)  Goal: Acceptable Pain Control and Functional Ability  Outcome: Progressing     Problem: Hypertension Comorbidity  Goal: Blood Pressure in Desired Range  Outcome: Ongoing - Unchanged     Problem: Heart Failure Comorbidity  Goal: Maintenance of Heart Failure Symptom Control  Outcome: Ongoing - Unchanged

## 2021-09-30 NOTE — Unmapped (Signed)
Division of Infectious Diseases  General Inpatient Consultation Service     For any questions about this consult, page [phone] (Gen C Follow-up Pager).      Colleen Russo is being seen in consultation at the request of Arman Filter, MD for evaluation and management of bacteremia and abd wall abscess/enterocutaneous fistula.     Plan for 09/28/2021  Diagnostic  ??? Follow pending blood cultures and peritoneal cultures  ??? Follow up abdominal swab culture from 2/19: ID has requested fluconazole susceptibility testing, due to return on or around 10/03/21    ??? Monitor for antimicrobial toxicity with the following:  ??? Daily BMP w dif  for now (given AKI), CBCS at least weekly    Treatment  Polymicrobial bacteremia  ??? S/p vancomycin (dosed by pharmacy for goal trough 10-15) last vanco level 2/23 was 34.6 so likely still has systemic levels on 2/24 and will have completed 7d course     Abdominal wall abscess, enterocutaneous fistula  ??? Continue Fluconazole, which will need to be renally dose adjusted d/t findings of Candida dubliniensis on abdominal swab and persistent drainage. Would limit to 10-14d course which should also end around 10/09/21.  ??? Continue Flagyl   ??? Continue Cefepime 1g Q12, this may need further dose adjustments pending CrCl, please renally adjust  ??? Duration of therapy = Recommend 4wks of therapy for her abdominal infections.  o start date = 09/10/21 when drain was placed, though therapy adjusted 2/22 from levo->cefepime.  o end date = 10/09/21    I discussed the plans for today with patient on 09/30/2021.    Our service will continue to follow.  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    Patient has been on broad spectrum abx since 2/7 with drain placement. At this time would favor antibiotics through 3/8 as this would cover her polymicrobial bacteremia and give sufficient time for complicated intra-abdominal infection with her known fistula. During her hospitalization we opted to cover candida species as she continued to have purulent drainage while on broad spectrum. By day of discharge her wounds were draining feculent material suggestive of fistulizing process. Wound around fistula site was not inflammed and no purulence seen. Discussed with patient that this is a difficult to treat infection as there is ongoing contamination with fecal contents. We requested ID follow up towards the end of her therapy. We discussed that should she have clinical changes such as worsened discharge, would wonder if drainage represents EC fistula and contents, or ongoing infection not covered by above regimen.     September 30, 2021   Patient has: []  acute illness w/systemic sxs  [mod] [x]  illness posing risk to life or function  [high]   I reviewed:   (3+) [x]  primary team note [x]  consultant note(s) []  procedure/op note(s) [x]  micro  result(s)    [x]  CBC results [x]  chemistry results []  radiology report(s) []  w/indep. historian   I independently visualized:   (any)   []  cxs/plates in lab []  plain film images []  CT images []  PET images    []  path slide(s) []  ECG tracing []  MRI images []  nuclear scan   I discussed: (any) []  micro and/or path w/lab personnel []  drug options and/or interactions w/ID pharmD    []  procedure/OR findings w/other MD(s) []  echo and/or imaging w/other MD(s)    []  mgm't w/attending(s) involved in case []  setting up home abx w/OPAT team   Mgm't requires: []  prescription drug(s)  [mod] [x]  intensive toxicity monitoring  [high]   Minutes: []  25-34      [x]  35-49      []  50+ []  30-74 providing critical care on the unit     Interval notes & result reports show: Afebrile. Normotensive. NEO.    Patient reports: Swelling improved. No f/c/s, improving nausea. Continued scant drainage from skin sinus tract superior to abdominal drain exit site.     My interpretation of the data I reviewed is: WBC normal at 8.4. Hg stable and low at 9.6. HypoK at 3.1, cr 1.39 and overall improving.    # Polymicrobial Bacteremia: Bacillus cereus, Staph hominis, Staph epi bacteremia   - acute, poses threat to life or bodily function  [high]  Staph hominis now in 3 separate peripheral blood cultures and 1 blood culture via PICC collected across ~13h on 2/18 and 2/19, clearly a true infection. B. cereus in 2/2 peripheral cultures from 2/18, did not grow on subsequent cx. Staph epi not in initial cultures but in 2 of 2 (peripheral, PICC) on 2/19, so these also likely represent true bacteremia. Both can be found in the GI tract and also (esp.the Staph) on skin and wounds, so an initial source of the enterocutaneous fistula or elsewhere in the GI tract is most likely, but suspect PICC was infected, so removed 09/25/21. We generally treat true bacteremia of each of these pathogens with at least 7 days of IV antibiotics, sometimes up to 14 days.   ??? Follow repeat blood cultures  to evaluate for clearance. Planned duration will be at least 7 days from first negative culture; tentatively 09/24/21-10/01/21    # Enterocutaneous fistula, abdominal wall abscess, possible peritonitis   - acute, poses threat to life or bodily function  [high]  S/p abscess drain placement by VIR 2/7 w/ repeat CT 2/19 and drain output suggestive that abscess has resolved or nearly resolved. Though, continues to have frank purulence on 09/26/21 exam-- which may represent residual abscess fluid or residual fistula. Perhaps most concerningly, has poor prognosis for bowel obstruction and for healing her enterocutaneous fistula, per gyn-onc, so will likely remain at high risk for persistent or new intraabdominal infections. Abdominal swab (taken by nursing on 2/19) does show anaerobes and candida dubliniensis. While this yeast is often a colonizing pathogen, in her context, this may be contributing to ongoing pathogenic infection. As such, would opt to cover this (routinely susceptible to fluconazole, but full susceptibilities will be run/made available around 10/03/21 per discussion with microbiology on 09/26/21).  - Peritoneal studies revealed monocytic predominance of cells (351 clumped nucleated cells, 2% neut) w/ SAAG 0.8 and protein 2.8 which can be consistent with underlying malignancy  -Cytology w/ metastatic disease  -Tx: Vanco + cefepime + flagyl (anaerobes on abd swab 2/19) + fluconazole (added 2/23 due to ongoing drainage and c dubliniensis  on swab 2/19). Tentatively would treat through 10/09/21    # AKI, improving 09/25/21  Noted 09/25/21, 0.62->1.87. Insults include possible hypovolemia, Vancomycin and contrast  -Recommend workup per primary team    # Leukopenia, stable   - acute, undx'd new problem w/uncertain prognosis  [mod]  ??? 2.9 on 2/20, down from baseline of 6-7, resolved the next day.  Continue to trend CBCs- would do with dif daily for now, given rash.  ???      # forearm rash, resolved   - acute, undx'd new problem w/uncertain prognosis  [mod]  ??? Fairly small area, not typical drug rash appearance, improved on 2/21. Monitor.    # Management of prescription antimicrobials needing intensive toxicity monitoring  Metronidazole can cause rashes, dysgeusia (altered taste), peripheral neuropathy, and/or myelosuppression.  Quinolones can cause rashes, N/V/D, CNS effects, MSK effects, arrhythmias and/or QTc prolongation, glucose abnormalities, and/or neuropathies.  Vancomycin can cause back pain, various dermatological effects, thrombocytopenia, neutropenia, and/or nephrotoxicity.   ??? See recommendations in blue box above.    # Disposition   -Likely home once improved. Likely OPAT candidate if prolonged IV antibiotics were to be required.         Antimicrobials & Other Medications   Vanc IV 2/18-  Flagyl 500 PO q  2/8-  Cefepime 2/22-  Fluconazole 2/23- current    Prior:  Levofloxacin 750 IV q 24 2/8 -     Immunomodulators   Dexamethasone taper 2/10-2/17, hydrocortisone/dexamethasone 2/18-present  - last chemo ~9 months ago    Current Medications as of 09/30/2021  Scheduled  PRN   Cefepime, 2 g, Q12H SCH  dexAMETHasone, 0.5 mg, Daily  docusate sodium, 100 mg, Daily  dronabinoL, 5 mg, Nightly  fluconazole (DIFLUCAN) INTRAVENOUS, 400 mg, Q24H  heparin, porcine (PF), 200 Units, Q MWF  flu vacc qs2022-23 6mos up(PF), 0.5 mL, During hospitalization  lidocaine, 1 patch, Daily  metroNIDAZOLE, 500 mg, Q8H SCH  OLANZapine, 2.5 mg, Nightly  pantoprazole, 40 mg, Daily  polyethylene glycol, 17 g, Daily  potassium chloride in water, 20 mEq, Q2H  scopolamine, 1 patch, Q72H  senna, 2 tablet, Nightly      acetaminophen, 650 mg, Q6H PRN  bisacodyL, 10 mg, Daily PRN  calcium carbonate, 200 mg of elem calcium, TID PRN  metoclopramide, 10 mg, TID PRN  naloxone, 0.1 mg, Q5 Min PRN  ondansetron, 4 mg, Q6H PRN  simethicone, 80 mg, Q6H PRN  sorbitoL, 30 mL, Daily PRN           Physical Exam     Temp:  [36.5 ??C (97.7 ??F)-36.8 ??C (98.2 ??F)] 36.7 ??C (98.1 ??F)  Heart Rate:  [78-81] 78  Resp:  [16-18] 18  BP: (118-133)/(85-108) 118/85  MAP (mmHg):  [97-100] 97  SpO2:  [98 %-100 %] 99 %    Actual body weight: 74.6 kg (164 lb 7.4 oz)  Ideal body weight: 50.1 kg (110 lb 7.2 oz)  Adjusted ideal body weight: 59.9 kg (132 lb 0.9 oz)     CONST: Vital signs above. Non-toxic appearance, no acute distress  EYES: anicteric sclerae. EOMI.   ENMT: Normal appearance of external nose and ears. Dry MM.   NECK: supple, feeding tubeentering through L neck   ABD: soft,  ND, +BS, no tenderness to palpation. Bandaged. Small brownish staining seen on gauze covering fistula/abd abscess site. Scarred skin and JP drain exiting mid abdomen with completely clear fluid in bulb.  ZO:XWRU warm and dry.   NEURO: CN II-XII grossly  inact. moving all 4 extremities  PSYCH: normal mood and affect. Fully alert and oriented.     Patient Lines/Drains/Airways Status     Active Active Lines, Drains, & Airways     Name Placement date Placement time Site Days    CVC Double Lumen 09/27/21 Tunneled Right Internal jugular 09/27/21  0900  Internal jugular  3    Closed/Suction Drain Left LUQ Bulb 10 Fr. 09/10/21  1104  LUQ  19    Open Drain 1 Anterior;Left Neck 09/06/21  --  Neck  24    Peripheral IV 09/27/21 Left Forearm 09/27/21  1207  Forearm  2                Data for ID Decision Making  ( IDGENCONMDM )       Micro & Serological Data   ( RSLTMICRO  /  Dorice Lamas  /  00CXSRC  /  00CXRES  /  00CXSUSC )  2/21 bld cx x2: NGTD  2/19 abdominal swab (taken by nursing at bedside): Per discussion and review of plates with microbiology on 2/23, the predominant pathogen is candida dubliniensis w/ 1+ anaerobes. No other gram negatives seen. This isolate was referred to susceptibility testing 09/26/21 w/ plan to result by 10/03/21  2/19 bld cx x2: Staph hominis in 2/2 AND Staph epi in 2/2 (Mec A detected)  2/18 bld cx: Bacillus cereus (2/2), Staph hominis (2/2)  2/17 ucx neg  2/7 abd aspirate: gram: 2+ GPR. Cx: 4+ mixed GP/GN orgs, incl. 3+ Citrobacter freundii (R unasyn, aztreo, cefazolin, ceftaz, ctx, I zosyn)), 2+ PsA (pan suscep), 4+ Strep anginosus  1/28 HIV neg    Recent Studies  ( RISRSLT )  2/19 CT abd pelvis w/ IV [id number]:  1. Persistent dilatation of small bowel loops with several transition points compatible with complex small bowel obstruction with likely closed loop component. There is slight interval increase in enhancement of the affected bowel mucosa. Recommend correlation with lactate and/or surgical consultation for ischemia.     2. Scattered foci of gas in the anterior peritoneum. These do not definitively technique with the enterocutaneous fistula and may represent focal walled off perforation.     3. New enhancement of peritoneal ascites concerning for peritonitis.     4. Decreased size of gas and fluid containing collection in the left anterior abdominal wall arising from the known enterocutaneous fistula status post drain placement.    ADDENDUM:   (09/22/2021 8:26 PM)  On review, the following additional findings were noted:     Limited exam in the absence of oral contrast.      Redemonstration of a complex enterocutaneous fistula with associated soft tissue stranding at the level of the anterior midline abdomen. There has been interval placement of a pigtail drainage catheter within the previously-described air and fluid collection along the left lower anterior abdominal wall, which is significantly decreased in size when compared to prior exam.     Numerous persistently dilated loops of small bowel are seen throughout the abdomen and pelvis, which demonstrate air-fluid levels and measure up to 3.7 cm in diameter. There is similar tethering of the small bowel along the anterior peritoneum with abrupt collapse at this level, thought likely to reflect a transition point (2:72). The distal small bowel appears mostly decompressed to the level of the cecum. These findings remain worrisome for small bowel obstruction.      Low attenuation abdominopelvic ascites (~18 HU) and diffuse peritoneal thickening/enhancement is similar to prior  exam. While these findings, could potentially be seen in the setting of peritonitis, could also be seen with diffuse peritoneal disease spread and associated malignant ascites. Recommend clinical correlation.     Elsewhere throughout the abdomen multiple loops of small demonstrate short segments of wall thickening, hyperenhancement, and luminal narrowing (2:63; 2:94), which may reflect sites of serosal involvement and associated stricturing        Initial Consult Documentation from September 23, 2021     Sources of information include: chart review, patient and treating providers.    History of Present Illness:     Ms. Brazeau was admitted to [facility] 2/17 with persistent hypoglycemia to 30s despite holding long acting insulin. This started a few days  after discharge home 2/10 from her last admission. Reports she experienced periods of lightheadness, decreased peripheral vision and is told she had slurred speech during these episodes, during which her glucose was found to be low. She would also often feel hot or chilled when her sugar was low, not oat other times. No rigors.     Reports full adherence to levo/flagyl she was taking  for abdominal abscess s/p VIR drainage 2/7. She reports that either as soon as or within a day or two after going home the output into her abdominal/fistula site JP drain stopped, with no drainage (other than the fluid she flushes in) since. She has had very scant greenish drainage from the fistula/abscess exit site area- enough to stain overlying dressing but not enough to soak through or really be visible on the skin. Reports her abdominal pain is unchanged.     Blood cultures were drawn, which grew Bacillus and a coag negative staph in both peripheral blood cultures. She has remained afebrile without infectious symptoms. After initial admission to the MICU, transferred to the floor.     When blood cultures grew, vancomycin was added.     Today, she noticed a R forearm rash with a few dozen small, pinpoint red macules. It is not itchy or painful. No rash or lesions she has noticed elsewhere.     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 No definitive succus seen intra-op. Treated conservatively with bowel rest and TPN for 1 month then had several I&D of recollected abscess at site of EC fistula with drain placed and left until clinical and imaging evidence of resolution    -12/29-08/07/21 admission for partial SBO and abdominal wall fluid collection, biopsy of fluid collection consistent at first for upper GI or pancreatobiliary primary   - Last imaging: CTAP - 09/09/2021 with SBO, EC fistula and likely peritoneal carcinomatosis    --recurrent SBO 08/2021  --EC fistula and abd wall bowel abscess 09/2021  -IDDM2  -poor nutrition  -anemia of chronic dz  -chronic pain on fentanyl patches    Meds and Allergies  Patient has a current medication list which includes the following prescription(s): acetaminophen, bisacodyl, calcium carbonate, cyclophosphamide, docusate sodium, famotidine, fentanyl, glucose, insulin lispro, insulin nph, lidocaine, metoclopramide, olanzapine, olanzapine, omeprazole,  polyethylene glycol, prochlorperazine, scopolamine, senna, simethicone, sodium chloride, and [DISCONTINUED] fentanyl, and the following Facility-Administered Medications: acetaminophen, Adult Parenteral Nutrition **AND** fat emulsion 20 % with fish oil, [START ON 10/01/2021] Adult Parenteral Nutrition **AND** [START ON 10/01/2021] fat emulsion 20 % with fish oil, bisacodyl, calcium carbonate, cefepime (MAXIPIME) 2 g in sodium chloride 0.9 % (NS) 100 mL IVPB-connector bag, [EXPIRED] dexamethasone **FOLLOWED BY** dexamethasone, docusate sodium, dronabinol, fluconazole, heparin, porcine (pf), flu vacc qs2022-23 6mos up(pf), lidocaine, metoclopramide, metronidazole, naloxone, olanzapine, ondansetron, pantoprazole, polyethylene glycol, potassium chloride in water, scopolamine, senna, simethicone, sorbitol.    Allergies: Carboplatin and Ketamine       Social History  Patient  reports that she has never smoked. She has never used smokeless tobacco. Lives at home.

## 2021-10-01 LAB — COMPREHENSIVE METABOLIC PANEL
ALBUMIN: 2.2 g/dL — ABNORMAL LOW (ref 3.4–5.0)
ALKALINE PHOSPHATASE: 48 U/L (ref 46–116)
ALT (SGPT): <7 U/L — ABNORMAL LOW (ref 10–49)
ANION GAP: 8 mmol/L (ref 5–14)
AST (SGOT): 13 U/L (ref <=34)
BILIRUBIN TOTAL: <0.2 mg/dL — ABNORMAL LOW (ref 0.3–1.2)
BLOOD UREA NITROGEN: 32 mg/dL — ABNORMAL HIGH (ref 9–23)
BUN / CREAT RATIO: 30
CALCIUM: 8.5 mg/dL — ABNORMAL LOW (ref 8.7–10.4)
CHLORIDE: 106 mmol/L (ref 98–107)
CO2: 32 mmol/L — ABNORMAL HIGH (ref 20.0–31.0)
CREATININE: 1.05 mg/dL — ABNORMAL HIGH
EGFR CKD-EPI (2021) FEMALE: 67 mL/min/{1.73_m2} (ref >=60)
GLUCOSE RANDOM: 163 mg/dL (ref 70–179)
POTASSIUM: 4 mmol/L (ref 3.4–4.8)
PROTEIN TOTAL: 5 g/dL — ABNORMAL LOW (ref 5.7–8.2)
SODIUM: 146 mmol/L — ABNORMAL HIGH (ref 135–145)

## 2021-10-01 LAB — CBC W/ AUTO DIFF
BASOPHILS ABSOLUTE COUNT: 0 10*9/L (ref 0.0–0.1)
BASOPHILS RELATIVE PERCENT: 0.3 %
EOSINOPHILS ABSOLUTE COUNT: 0.1 10*9/L (ref 0.0–0.5)
EOSINOPHILS RELATIVE PERCENT: 1 %
HEMATOCRIT: 24.5 % — ABNORMAL LOW (ref 34.0–44.0)
HEMOGLOBIN: 8.3 g/dL — ABNORMAL LOW (ref 11.3–14.9)
LYMPHOCYTES ABSOLUTE COUNT: 1.2 10*9/L (ref 1.1–3.6)
LYMPHOCYTES RELATIVE PERCENT: 13.4 %
MEAN CORPUSCULAR HEMOGLOBIN CONC: 34.1 g/dL (ref 32.0–36.0)
MEAN CORPUSCULAR HEMOGLOBIN: 28.4 pg (ref 25.9–32.4)
MEAN CORPUSCULAR VOLUME: 83.2 fL (ref 77.6–95.7)
MEAN PLATELET VOLUME: 7.8 fL (ref 6.8–10.7)
MONOCYTES ABSOLUTE COUNT: 0.9 10*9/L — ABNORMAL HIGH (ref 0.3–0.8)
MONOCYTES RELATIVE PERCENT: 10.8 %
NEUTROPHILS ABSOLUTE COUNT: 6.4 10*9/L (ref 1.8–7.8)
NEUTROPHILS RELATIVE PERCENT: 74.5 %
PLATELET COUNT: 232 10*9/L (ref 150–450)
RED BLOOD CELL COUNT: 2.94 10*12/L — ABNORMAL LOW (ref 3.95–5.13)
RED CELL DISTRIBUTION WIDTH: 15.7 % — ABNORMAL HIGH (ref 12.2–15.2)
WBC ADJUSTED: 8.6 10*9/L (ref 3.6–11.2)

## 2021-10-01 LAB — PHOSPHORUS: PHOSPHORUS: 4.3 mg/dL (ref 2.4–5.1)

## 2021-10-01 LAB — MAGNESIUM: MAGNESIUM: 1.8 mg/dL (ref 1.6–2.6)

## 2021-10-01 MED ORDER — DRONABINOL 2.5 MG CAPSULE
ORAL_CAPSULE | Freq: Two times a day (BID) | ORAL | 0 refills | 15 days | Status: CP
Start: 2021-10-01 — End: ?

## 2021-10-01 MED ADMIN — fentaNYL (PF) (SUBLIMAZE) injection: INTRAVENOUS | @ 17:00:00 | Stop: 2021-10-01

## 2021-10-01 MED ADMIN — metroNIDAZOLE (FLAGYL) IVPB 500 mg: 500 mg | INTRAVENOUS | @ 19:00:00 | Stop: 2021-10-09

## 2021-10-01 MED ADMIN — MORPhine 4 mg/mL injection 2 mg: 2 mg | INTRAVENOUS | @ 15:00:00 | Stop: 2021-10-01

## 2021-10-01 MED ADMIN — fluconazole (DIFLUCAN) 2 mg/mL in sodium chloride 0.9% 400 mg: 400 mg | INTRAVENOUS | @ 01:00:00 | Stop: 2021-10-09

## 2021-10-01 MED ADMIN — cefepime (MAXIPIME) 2 g in sodium chloride 0.9 % (NS) 100 mL IVPB-connector bag: 2 g | INTRAVENOUS | @ 14:00:00 | Stop: 2021-10-10

## 2021-10-01 MED ADMIN — metroNIDAZOLE (FLAGYL) IVPB 500 mg: 500 mg | INTRAVENOUS | @ 11:00:00 | Stop: 2021-10-09

## 2021-10-01 MED ADMIN — fluconazole (DIFLUCAN) 2 mg/mL in sodium chloride 0.9% 400 mg: 400 mg | INTRAVENOUS | @ 21:00:00 | Stop: 2021-10-09

## 2021-10-01 MED ADMIN — iohexoL (OMNIPAQUE) 300 mg iodine/mL solution: @ 17:00:00 | Stop: 2021-10-01

## 2021-10-01 MED ADMIN — oxyCODONE (ROXICODONE) immediate release tablet 5 mg: 5 mg | ORAL | @ 06:00:00 | Stop: 2021-10-01

## 2021-10-01 MED ADMIN — Adult Parenteral Nutrition: INTRAVENOUS | @ 05:00:00 | Stop: 2021-10-01

## 2021-10-01 MED ADMIN — fat emulsion 20 % with fish oil (SMOFLIPID) infusion 250 mL: 250 mL | INTRAVENOUS | @ 05:00:00 | Stop: 2021-10-01

## 2021-10-01 MED ADMIN — metroNIDAZOLE (FLAGYL) IVPB 500 mg: 500 mg | INTRAVENOUS | @ 03:00:00 | Stop: 2021-10-09

## 2021-10-01 MED ADMIN — midazolam (VERSED) injection: INTRAVENOUS | @ 17:00:00 | Stop: 2021-10-01

## 2021-10-01 MED ADMIN — cefepime (MAXIPIME) 2 g in sodium chloride 0.9 % (NS) 100 mL IVPB-connector bag: 2 g | INTRAVENOUS | @ 03:00:00 | Stop: 2021-10-10

## 2021-10-01 MED ADMIN — lidocaine (LIDODERM) 5 % patch 1 patch: 1 | TRANSDERMAL | @ 03:00:00

## 2021-10-01 MED ADMIN — ondansetron (ZOFRAN) injection 4 mg: 4 mg | INTRAVENOUS | @ 15:00:00

## 2021-10-01 NOTE — Unmapped (Signed)
Hospital Medicine Daily Progress Note    Assessment/Plan:    Principal Problem:    Hypoglycemia  Active Problems:    Ovarian cancer (CMS-HCC)    Type 2 diabetes mellitus (CMS-HCC)    Sick-euthyroid syndrome    Enterocutaneous fistula    Abscess    Poor nutrition    Anemia of chronic disease  Resolved Problems:    * No resolved hospital problems. *                Colleen Russo is a 46 y.o. 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 Hospital course has been complicated by AKI and polymicrobial bacteremia.     TEG malposition: endorsed sensation of TEG in chest/throat with throat pain, difficulty swallowing, heartburn, and increased nausea. AXR showed no drain in LUQ and CXR confirmed tip of TEG malpositioned in upper esophagus. Likely occurred due to incorrect locking device that allowed tube to retract from correct position. Discussed with VIR and underwent successful exchange of TEG today.   - Appreciate VIR intervention  - Maintain TEG to gravity    Enterocutaneous fistula - Abdominal wall abscess - Hx intra-abdominal abscesses: Most recently s/p abscess drain placement by VIR 2/7; was treated with Zosyn --> levofloxacin/Flagyl on discharge through 09/22/21. CT A/P on admission with reduced abdominal wall fluid collection after drain placement. VIR recommended leaving drain in place until [facility] fistula has resolved, despite low drain output, as they expect the fluid collection will return. Ascitic fluid studies with few neutrophils reassuring against bacterial peritonitis, and peritoneal culture no growth. She has continued to have some cutaneous drainage, and swab from site has grown mixed anaerobes and Candida dubliniensis.  - Appreciate ID recommendations  - Appreciate CM assistance with home infusion  - Continue cefepime IV and metronidazole IV, anticipate 4 week course from drain placement (2/7 - 10/09/21)   - Continue fluconazole IV for Candida, anticipate 10-14 day course (2/23 - 10/09/21). QTc 426 on 2/27    AKI, not POA, improving: Baseline Cr 0.4 and on admission; Cr increased early in admission and peaked at 2.59 on 2/23. Suspect due to vancomycin nephrotoxicity (level peaked at 44 on 2/22) and/or contrast-induced nephropathy (received IV contrast 09/22/21). Creatinine improving daily to 1.05 today.   - Hold off on further diuresis for now  - Daily BMP   - Avoid nephrotoxins and renally dose meds    Peripheral edema, improved: Likely multifactorial due to fluid retention i/s/o resolving AKI and third spacing from increased vascular permeability with cancer and decreased oncotic pressure with hypoalbuminemia. Reassuringly, no signs of pulmonary edema or respiratory compromise. Responded very well to Lasix 80 IV x 1 on 2/26.   - Hold off on further diuresis for now  ??  Malnutrition secondary to chronic bowel obstruction requiring TPN - Chronic small bowel obstruction: will need to continue TPN while on bowel rest for management of chronic SBO and EC fistula. PICC line removed 2/22 and new tunneled line placed 09/27/21 by VIR. TPN restarted 2/24 and patient currently tolerating well. Hypokalemia resolved today. Does have some mild hypernatremia possibly due to free water deficit  - Appreciate Gynecology Oncology and Surgical Oncology evaluations; no plans for surgery currently  - NPO except sips/chips for comfort for bowel rest  - Continue TPN. 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 Patient wants to transition off opioids and use dronabinol for non-opioid pain relief after discharge.  - Discontinued fentanyl transdermal patch 2/27 per patient preference and will continue to monitor pain; may need to resume if pain increases signficantly  - Continue lidocaine patch and??Tylenol PRN  - Change dronabinol to 2.5 mg BID per patient request (previously on this dose in 08/2021)    Severe hypoglycemia, resolved: presented with blood glucose <55 with hypoglycemic symptoms in setting of home insulin use while on TPN. Last reported insulin administration was 2/16. Initial hypoglycemia labs consistent with insulin-dependent process, either exogenous or endogenous. Hypoglycemia has resolved with discontinuation of insulin and with dextrose-containing fluids and parenteral nutrition. Suspect the hyperglycemia that prompted insulin initiation during last admission was steroid-induced. With steroid taper, glucoses currently at goal without significant hyper- or hypo-glycemia.   - Appreciate Endocrinology recommendations, now signed off  - Continue to hold insulin. Do not anticipate she will require on discharge.     Polymicrobial bacteremia, resolved: Peripheral BCx 2/18 grew B cereus and Staph hominis. Repeat peripheral and central line BCx 2/19 grew Staph epi and Staph hominis. Repeat BCx 2/21 no growth. Bacteremia most likely due to translocation from [facility] fistula vs central line-associated (had PICC for TPN at time of bacteremia). Now s/p PICC removal 2/22 and new central line placement 2/24. Treated with IV vancomycin, complicated by supratherapeutic levels and AKI. Vancomycin has been held since 2/21 due to elevated levels but remains therapeutic, with level 17 today 2/25. Per ID, CoNS can be treated with 7 days of IV antibiotics.   - Appreciate ID recommendations  - s/p vancomycin x 7 days (2/18-2/24/23)    FEN: NPO for bowel rest (sips/chips for comfort); TEG for venting; TPN  VTE ppx: Lovenox  Code Status: Full  Disposition: EDD 3/1 with home infusion for TPN and antibiotics pending TPN cycling and setting up home services    I personally spent 50 minutes face-to-face and non-face-to-face in the care of this patient, which includes all pre, intra, and post visit time on the date of service.  All documented time was specific to the E/M visit and does not include any procedures that may have been performed.  ___________________________________________________________________    Subjective:  No acute events overnight. She developed pain with swallowing and increasing nausea and heartburn overnight and felt like her TEG had retracted and was stuck in her chest/throat. She has not had actual emesis but has been spitting up more. She is having more pain with removing the fentanyl patch but doesn't want to put the patch back on. She likes the dronabinol for pain relief, but feels that it wears off around 0600 and wants to try taking it BID. She is passing gas and had a BM yesterday. Her peripheral edema remains resolved.     Labs/Studies:  Labs per EMR and Reviewed (last 24hrs)    Objective:  Temp:  [37 ??C (98.6 ??F)-37.3 ??C (99.1 ??F)] (P) 37.3 ??C (99.1 ??F)  Heart Rate:  [85-105]  105  Resp:  [16-22] 22  BP: (104-124)/(77-88) 111/78  SpO2:  [97 %-100 %] 100 %    Gen: Sitting up in bed, more uncomfortable appearing than prior days, holding hand over mouth as if nauseated  Eyes: EOMI, conjunctivae clear  ENT: MMM. TEG retraccted with small amount of gastric contents in bag. Powerline in place in R anterior chest, insertion site c/d/i, sterile dressing intact  CV: RRR, nl S1 S2, no m/r/g  Pulm: CTAB, nl WOB on RA, no wheezes or crackles  Ext: WWP. Compression stockings on BLE. No peripheral pitting edema.  Skin: No rashes or lesions  Neuro: A&O x 3, no focal deficits  Psych: Appropriate affect

## 2021-10-01 NOTE — Unmapped (Signed)
[facility] INTERVENTIONAL RADIOLOGY - Operative Note     VIR Post-Procedure Note    Procedure Name: Exchange of percutaneous transesophageal gastrostomy tube    Pre-Op Diagnosis: metastatic ovarian cancer with PTEG for palliative venting    Post-Op Diagnosis: Same as pre-operative diagnosis    VIR Providers    Operator: Rolin Barry    Time out: Prior to the procedure, a time out was performed with all team members present. During the time out, the patient, procedure and procedure site when applicable were verbally verified by the team members and Dr. Melynda Ripple    Description of procedure: Image guided exchange of percutaneous transesophageal gastrostomy tube. 61F 45cm pigtail catheter placed with pigtail confirmed within the stomach.    Sedation:Moderate sedation    Estimated Blood Loss: approximately <5 mL  Complications: None    See detailed procedure note with images in PACS [facility]).    The patient tolerated the procedure well without incident or complication and left the room in stable condition.     Colleen Russo  10/01/2021 11:57 AM

## 2021-10-01 NOTE — Unmapped (Signed)
GYN ONC PROGRESS NOTE    Assessment and Plan:  Colleen Russo is a 46 y.o. now HD#12 with h/o recurrent stage IIIA clear cell carcinoma of the ovary, recurrent SBO, EC fistula, poor nutrition on TPN who was admitted for refractory hypoglycemia of unclear etiology found to have bacteremia. Now s/p PICC removal replacement with IJ. Remains with benign abdominal exam, no concern for ischemia in the setting of her chronic obstruction. Hypoglycemia now resolved on TPN. Continues on IV antibiotics and fungal coverage for bacteremia likely intraabdominal/cutaneous etiology. Appreciate management by Medicine team. Working towards dispo planning.    Onc: Recurrent Clear Cell Carcinoma of the Ovary   - Primary Oncologist: [name]  - s/p XL??with??radical tumor debulking including??total hysterectomy,??bilateral salpingo-oophorectomy, infra-gastric??omentectomy, and??washings??on 07/21/2019  -??s/p 6 cycles carbo/taxol, completed 12/05/19  - CT 2/022 concerning for progression, started on carbo/gem 2/22-5/24/22.  - CT 12/2020 stable disease  - 08/2021 recurrent disease, abdominal mass bx  - 09/2021 recurrent SBO, likely closed loop c/b intraabdominal infection with JP drain placed 2/7  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  - Chemo plan: cyclophosphamide, keytruda after completion of abx  - Last CA-125:??180 on 09/06/2021  - Will request for follow up appointment with GynOnc    Dislodged Catheter  - KUB indicated mispositioned   - Plan for VIR correction today     Refractory Hypoglycemia, resolved - T2DM   - Discharged 2/9 on NPH 20 units daily to take 1 hr prior to TPN > attempted to decrease as an outpatient and ultimately discontinued given symptomatic lows, last took subq insulin 2/16   - Per Endocrine, unclear etiology  - Management per Primary Team, Endocrine following, appreciate recommendations; signed off   - No longer on dextrose IV after PN starts   - Dexamethasone taper per Medicine   - No indication for longterm steroids from GynOnc perspective, Dex taper per primary  team   - BG at goal, currently not requiring insulin in TPN    Enterocutaneous Fistula - Hx abscesses - Bacteremia, new abdominal ascites  - History of enterocutaneous fistula,??and abscesses requiring drainage.  - Most recently, abscess drain placement by VIR 2/7, initially on Zosyn > s/p Levofloxacin and Flagyl   - BCx positive   - 2/19 Staph epi, S. hominis, MRSA, Bacillus cereus   - ID consulted likely component of PICC colonization and intraabdominal source   - 2/21 Repeat BCx NGTD  - 2/19 Abdominal/cutaneous Cx; 1+ Candida dubliniensis> Fluconazole (2/23- )  - 2/19 repeat CTAP with redemonstration of ECF, decreased air and fluid collection along left lower anterior abdominal wall which is significantly decreased s/p VIR drain 2/7 making new abdominal abscess less likely, however bowel edema with possible ischemic component  - 2/21 VIR paracentesis 150cc amber fluid   - Culture NGTD   - Cytology pending   - Protein 2.8, Glucose 69, Albumin 1.6   - Micro most consistent with intraabdominal and cutaneous etiology of bacteremia  - Abdominal exam remains benign, patient afebrile and clinically well appearing without concerns for peritonitis  - Plan for: Total 4 weeks of  IV Cefepime/Flagyl/Fluconazole, end 3/8, follow up with ID 3/7    Small bowel obstruction - TEG  - s/p TEG placement on 2/3  - Home reg: scopolamine patch, using Reglan and Compazine PRN  - 2/19 CTAP persistent small bowel obstruction, likely closed loop component with slight interval increase in enhancement of affected bowel mucosa  - 2/20 Colorectal  surgery cs: agree ECF does not appear to be the source of bacteremia given drainage of previously existing fluid collections  - Remains with a benign abdominal exam  - TEG dislodged this morning, recommend VIR consult for advancement  ??  Sick Euthyroid Syndrome  - Last admit TSH 0.237, FT4 0.54  ????  Chronic Pain  - S/p Palliative Care consult, pain well controlled on 50 mcg fentanyl patches and Tylenol PRN  ??  Poor Nutrition  - S/p Nutrition consult 2/18  - Restarted on TPN 2/27  - Sips for comfort only     Anemia of Chronic Disease, stable  - Admit Hgb 7.9 stable from prior  ??  PPX: LVX  ??  Code Status: full code  ??  Disposition: floor    Patient seen and plan discussed with Dr. Merry Proud and Attending Dr. Reyne Dumas immediately available.    Subjective:  Having some throat discomfort and nausea since TEG dislodged. No significant abdominal pain.    Objective:  Temp:  [37 ??C (98.6 ??F)-37.2 ??C (99 ??F)] 37.2 ??C (99 ??F)  Heart Rate:  [85-92] 85  Resp:  [16-19] 16  BP: (116-124)/(85-88) 116/85  SpO2:  [92 %-97 %] 97 %    -General:                    Well-appearing in NAD.  -CV:                             Regular rate and rhythm  -Chest:                    Normal WOB.   -Abd:                           Non-tender, non-distended abdomen.  ECF dressing clean/dry/intact. TEG tubing displaced, bag with bilious outoput, abdominal JP drain without output  -Extremities:              Non pitting edema bilaterally, symmetric and nontender  -Psych:                       Appropriate.The patient is awake and alert.    Medications (scheduled)   ??? Cefepime  2 g Intravenous Q12H [facility]   ??? dexAMETHasone  0.5 mg Oral Daily   ??? docusate sodium  100 mg Oral Daily   ??? dronabinoL  5 mg Oral Daily   ??? fluconazole (DIFLUCAN) INTRAVENOUS  400 mg Intravenous Q24H   ??? heparin, porcine (PF)  200 Units Intravenous Q MWF   ??? flu vacc qs2022-23 6mos up(PF)  0.5 mL Intramuscular During hospitalization   ??? lidocaine  1 patch Transdermal Daily   ??? metroNIDAZOLE  500 mg Intravenous [facility]   ??? OLANZapine  2.5 mg Oral Nightly   ??? pantoprazole  40 mg Oral Daily   ??? polyethylene glycol  17 g Oral Daily   ??? scopolamine  1 patch Topical Q72H   ??? senna  2 tablet Oral Nightly       Medications (prn)  acetaminophen, bisacodyL, calcium carbonate, metoclopramide, naloxone, ondansetron, simethicone, sorbitoL    Labs:   Lab Results   Component Value Date    WBC 8.4 09/30/2021    HGB 9.6 (L) 09/30/2021    HCT 27.9 (L) 09/30/2021    PLT 252 09/30/2021  Lab Results   Component Value Date    NA 146 (H) 09/30/2021    K 3.1 (L) 09/30/2021    CL 102 09/30/2021    CO2 34.0 (H) 09/30/2021    BUN 31 (H) 09/30/2021    CREATININE 1.39 (H) 09/30/2021    GLU 139 09/30/2021    CALCIUM 8.8 09/30/2021    MG 1.8 09/30/2021    PHOS 4.3 09/30/2021       Lab Results   Component Value Date    BILITOT <0.2 (L) 09/30/2021    BILIDIR 0.10 09/11/2021    PROT 5.5 (L) 09/30/2021    ALBUMIN 2.4 (L) 09/30/2021    ALT <7 (L) 09/30/2021    AST 15 09/30/2021    ALKPHOS 55 09/30/2021       Lab Results   Component Value Date    INR 1.14 09/21/2021    APTT 30.7 09/21/2021     Scribe's Attestation: Lance Muss, MD obtained and performed the history, physical exam and medical decision making elements that were  entered into the chart. Documentation assistance was provided by me personally, a scribe. Signed by Freddie Apley Scribe, on October 01, 2021 at 6:12 AM.       ----------------------------------------------------------------------------------------------------------------------  October 01, 2021 7:12 AM. Documentation assistance provided by the Scribe. I was present during the time the encounter was recorded. The information recorded by the Scribe was done at my direction and has been reviewed and validated by me.  ----------------------------------------------------------------------------------------------------------------------

## 2021-10-01 NOTE — Unmapped (Signed)
Pt eager to go home tomorrow if possible. She had her fluconazole administered a  little late due to being late from pharmacy. She has stable vitals, talks with family on phone and support group, she ambulates in hallways and feels much less swollen. Will continue with plano f care.   Problem: Adult Inpatient Plan of Care  Goal: Plan of Care Review  Outcome: Progressing  Goal: Patient-Specific Goal (Individualized)  Outcome: Progressing  Flowsheets (Taken 09/30/2021 1904)  Patient-Specific Goals (Include Timeframe): Pt will tolerate ambulating in the hallways x2  Goal: Absence of Hospital-Acquired Illness or Injury  Outcome: Progressing  Intervention: Identify and Manage Fall Risk  Recent Flowsheet Documentation  Taken 09/30/2021 0800 by Levester Fresh, RN  Safety Interventions:   fall reduction program maintained   family at bedside  Intervention: Prevent and Manage VTE (Venous Thromboembolism) Risk  Recent Flowsheet Documentation  Taken 09/30/2021 0800 by Levester Fresh, RN  Activity Management:   activity adjusted per tolerance   ambulated outside room  Intervention: Prevent Infection  Recent Flowsheet Documentation  Taken 09/30/2021 0800 by Levester Fresh, RN  Infection Prevention: cohorting utilized  Goal: Optimal Comfort and Wellbeing  Outcome: Progressing  Goal: Readiness for Transition of Care  Outcome: Progressing  Goal: Rounds/Family Conference  Outcome: Progressing     Problem: Self-Care Deficit  Goal: Improved Ability to Complete Activities of Daily Living  Outcome: Progressing     Problem: Skin Injury Risk Increased  Goal: Skin Health and Integrity  Outcome: Progressing  Intervention: Optimize Skin Protection  Recent Flowsheet Documentation  Taken 09/30/2021 0800 by Levester Fresh, RN  Pressure Reduction Techniques:   frequent weight shift encouraged   heels elevated off bed  Pressure Reduction Devices: positioning supports utilized     Problem: Heart Failure Comorbidity  Goal: Maintenance of Heart Failure Symptom Control  Outcome: Progressing     Problem: Hypertension Comorbidity  Goal: Blood Pressure in Desired Range  Outcome: Progressing     Problem: Pain Chronic (Persistent) (Comorbidity Management)  Goal: Acceptable Pain Control and Functional Ability  Outcome: Progressing

## 2021-10-01 NOTE — Unmapped (Signed)
Pt alert and oriented x 4, VSS. Pt has been complaining of difficulty swallowing and increased pain overnight. Oxycodone given x 1 and pt kept strictly NPO per MD order d/t possible malposition of gastric tube. KUB done at bedside. TPN and IV abx continued. Will monitor closely.  Problem: Adult Inpatient Plan of Care  Goal: Plan of Care Review  Outcome: Progressing  Goal: Patient-Specific Goal (Individualized)  Outcome: Progressing  Goal: Absence of Hospital-Acquired Illness or Injury  Outcome: Progressing  Intervention: Identify and Manage Fall Risk  Recent Flowsheet Documentation  Taken 09/30/2021 2000 by Joan Flores, RN  Safety Interventions:   infection management   lighting adjusted for tasks/safety   low bed   nonskid shoes/slippers when out of bed  Intervention: Prevent and Manage VTE (Venous Thromboembolism) Risk  Recent Flowsheet Documentation  Taken 09/30/2021 2000 by Joan Flores, RN  Activity Management: sitting, edge of bed  Intervention: Prevent Infection  Recent Flowsheet Documentation  Taken 09/30/2021 2000 by Joan Flores, RN  Infection Prevention:   environmental surveillance performed   rest/sleep promoted   single patient room provided   cohorting utilized  Goal: Optimal Comfort and Wellbeing  Outcome: Progressing  Goal: Readiness for Transition of Care  Outcome: Progressing     Problem: Self-Care Deficit  Goal: Improved Ability to Complete Activities of Daily Living  Outcome: Progressing     Problem: Skin Injury Risk Increased  Goal: Skin Health and Integrity  Outcome: Progressing

## 2021-10-01 NOTE — Unmapped (Signed)
VASCULAR INTERVENTIONAL RADIOLOGY INPATIENT  CONSULTATION     Requesting Attending Physician: Arman Filter, *  Service Requesting Consult: Med Bernita Raisin [facility])    Date of Service: 10/01/2021  Consulting Interventional Radiologist: Dr. Melynda Ripple       HPI:     Reason for consultation: Retracted PTEG       Colleen Russo is a 46 y.o. female with metastatic ovarian cancer, SBO, EC fistula, status post transesophageal percutaneous gastrostomy tube placement on 09/23/2021 for palliative venting.  PTEG appears to be retracted at bedside.  Abdominal KUB does not demonstrate PTEG in the stomach.  Pt seen in consultation at the request of primary care team for consideration for PTEG replacement/exchange.  Patient now with some difficulty swallowing.  PTEG found to be coiled in the esophagus on CXR.       Medical History:     Past Medical History:  No past medical history on file.    Surgical History:  Past Surgical History:   Procedure Laterality Date   ??? IR INSERT G-TUBE PERCUTANEOUS  09/06/2021    IR INSERT G-TUBE PERCUTANEOUS 09/06/2021 Gwenlyn Fudge, MD IMG [facility]       Family History:  The patient's family history is not on file..    Medications:   Current Facility-Administered Medications   Medication Dose Route Frequency Provider Last Rate Last Admin   ??? acetaminophen (TYLENOL) tablet 650 mg  650 mg Oral Q6H PRN [name], MD   650 mg at 09/25/21 0002   ??? Adult Parenteral Nutrition   Intravenous Continuous Arman Filter, MD 110 mL/hr at 10/01/21 0000 New Bag at 10/01/21 0000    And   ??? fat emulsion 20 % with fish oil (SMOFLIPID) infusion 250 mL  250 mL Intravenous Continuous Arman Filter, MD 20.8 mL/hr at 10/01/21 0000 250 mL at 10/01/21 0000   ??? [START ON 10/02/2021] Adult Parenteral Nutrition   Intravenous Continuous Arman Filter, MD        And   ??? [START ON 10/02/2021] fat emulsion 20 % with fish oil (SMOFLIPID) infusion 250 mL  250 mL Intravenous Continuous Arman Filter, MD       ??? bisacodyL (DULCOLAX) suppository 10 mg  10 mg Rectal Daily PRN [name], MD   10 mg at 09/27/21 1647   ??? calcium carbonate (TUMS) chewable tablet 200 mg of elem calcium  200 mg of elem calcium Oral TID PRN [name], MD   200 mg of elem calcium at 09/30/21 1452   ??? cefepime (MAXIPIME) 2 g in sodium chloride 0.9 % (NS) 100 mL IVPB-connector bag  2 g Intravenous Q12H [facility] Arman Filter, MD 200 mL/hr at 10/01/21 0843 2 g at 10/01/21 0843   ??? dexAMETHasone (DECADRON) tablet 0.5 mg  0.5 mg Oral Daily [name], MD   0.5 mg at 09/30/21 1021   ??? docusate sodium (COLACE) capsule 100 mg  100 mg Oral Daily [name], MD   100 mg at 09/30/21 0901   ??? dronabinoL (MARINOL) capsule 5 mg  5 mg Oral Q PM Arman Filter, MD       ??? fluconazole (DIFLUCAN) 2 mg/mL in sodium chloride 0.9% 400 mg  400 mg Intravenous Q24H Arman Filter, MD   Stopped at 09/30/21 2136   ??? heparin, porcine (PF) 100 unit/mL injection 200 Units  200 Units Intravenous Q MWF [name], MD       ???  influenza vaccine quad (FLUARIX, FLULAVAL, FLUZONE) (6 MOS & UP) 2022-23  0.5 mL Intramuscular During hospitalization [name], MD       ??? lidocaine (LIDODERM) 5 % patch 1 patch  1 patch Transdermal Daily [name], MD   1 patch at 09/30/21 2148   ??? metoclopramide (REGLAN) tablet 10 mg  10 mg Oral TID PRN [name], MD   10 mg at 09/26/21 1610   ??? metroNIDAZOLE (FLAGYL) IVPB 500 mg  500 mg Intravenous [facility] Arman Filter, MD   Stopped at 10/01/21 [id number]   ??? naloxone (NARCAN) injection 0.1 mg  0.1 mg Intravenous Q5 Min PRN [name], MD       ??? OLANZapine (ZyPREXA) tablet 2.5 mg  2.5 mg Oral Nightly Arman Filter, MD   2.5 mg at 09/28/21 2028   ??? ondansetron (ZOFRAN) injection 4 mg  4 mg Intravenous Q6H PRN [name], MD   4 mg at 09/30/21 1043   ??? pantoprazole (PROTONIX) EC tablet 40 mg  40 mg Oral Daily [name], MD   40 mg at 09/30/21 0901   ??? polyethylene glycol (MIRALAX) packet 17 g  17 g Oral Daily [name], MD       ??? scopolamine (TRANSDERM-SCOP) 1 mg over 3 days topical patch 1 mg  1 patch Topical Q72H [name], MD   1 mg at 09/29/21 0617   ??? senna (SENOKOT) tablet 2 tablet  2 tablet Oral Nightly [name], MD   2 tablet at 09/28/21 2028   ??? simethicone (MYLICON) chewable tablet 80 mg  80 mg Oral Q6H PRN [name], MD   80 mg at 09/30/21 1049   ??? sorbitol oral solution  30 mL Oral Daily PRN [name], MD           Allergies:  Carboplatin and Ketamine    Social History:  Social History     Tobacco Use   ??? Smoking status: Never   ??? Smokeless tobacco: Never       Objective:    Pertinent Laboratory Values:  WBC   Date Value Ref Range Status   10/01/2021 8.6 3.6 - 11.2 10*9/L Final     HGB   Date Value Ref Range Status   10/01/2021 8.3 (L) 11.3 - 14.9 g/dL Final     HCT   Date Value Ref Range Status   10/01/2021 24.5 (L) 34.0 - 44.0 % Final     Platelet   Date Value Ref Range Status   10/01/2021 232 150 - 450 10*9/L Final     INR   Date Value Ref Range Status   09/21/2021 1.14  Final     Creatinine   Date Value Ref Range Status   09/30/2021 1.39 (H) 0.60 - 0.80 mg/dL Final       Imaging Reviewed:     Abdominal x-ray 10/01/2021 reviewed    Anticoaguation: No    Physical Exam:    Vitals:    10/01/21 0604   BP: 116/85   Pulse: 85   Resp:    Temp: 37.2 ??C (99 ??F)   SpO2:      ASA Grade: ASA 3 - Patient with moderate systemic disease with functional limitations  General: No apparent distress.  Airway assessment: Class 2 - Can visualize soft palate and fauces, tip of uvula is obscured  Lungs: Breathing comfortably on room air.  Heart:  Regular  rate and rhythm.  Neuro: No obvious focal deficits.    Assessment and Recommendations:     Colleen Russo is a 46 y.o. female with history of ovarian cancer and pertinent history per HPI with PTEG( 72F 45cm Skater) placed 09/06/2021 that appears to be now dislodged/retracted. PTEG found to be coiled in the esophagus on x-ray imaging.    Imaging and case discussed with Dr. Theron Arista bream.  We will plan to attempt to exchange indwelling PTEG over the wire versus replacement via existing tract site.     -We recommend proceeding with PTEG exchange/replacement.    -Anticipated Procedure Date: Today  -NPO at the [facility] the night prior to procedure      Informed Consent:  This procedure and sedation has been fully reviewed with the patient/patient???s authorized representative. The risks, benefits and alternatives have been explained, and the patient/patient???s authorized representative has consented to the procedure.  --The patient will accept blood products in an emergent situation.  --The patient does not have a Do Not Resuscitate order in effect.      The patient was discussed with  Dr. Melynda Ripple .     Thank you for involving [place] in the care of this patient. Please page the VIR consult pager [phone]) with further questions, concerns, or if new issues arise.

## 2021-10-02 ENCOUNTER — Other Ambulatory Visit (HOSPITAL_COMMUNITY): Payer: Self-pay

## 2021-10-02 DIAGNOSIS — G893 Neoplasm related pain (acute) (chronic): Principal | ICD-10-CM

## 2021-10-02 DIAGNOSIS — K56609 Unspecified intestinal obstruction, unspecified as to partial versus complete obstruction: Principal | ICD-10-CM

## 2021-10-02 DIAGNOSIS — C569 Malignant neoplasm of unspecified ovary: Principal | ICD-10-CM

## 2021-10-02 LAB — COMPREHENSIVE METABOLIC PANEL
ALBUMIN: 2.2 g/dL — ABNORMAL LOW (ref 3.4–5.0)
ALKALINE PHOSPHATASE: 49 U/L (ref 46–116)
ALT (SGPT): <7 U/L — ABNORMAL LOW (ref 10–49)
ANION GAP: 8 mmol/L (ref 5–14)
AST (SGOT): 15 U/L (ref <=34)
BILIRUBIN TOTAL: <0.2 mg/dL — ABNORMAL LOW (ref 0.3–1.2)
BLOOD UREA NITROGEN: 41 mg/dL — ABNORMAL HIGH (ref 9–23)
BUN / CREAT RATIO: 46
CALCIUM: 8.5 mg/dL — ABNORMAL LOW (ref 8.7–10.4)
CHLORIDE: 109 mmol/L — ABNORMAL HIGH (ref 98–107)
CO2: 28 mmol/L (ref 20.0–31.0)
CREATININE: 0.89 mg/dL — ABNORMAL HIGH
EGFR CKD-EPI (2021) FEMALE: 82 mL/min/{1.73_m2} (ref >=60)
GLUCOSE RANDOM: 204 mg/dL — ABNORMAL HIGH (ref 70–179)
POTASSIUM: 3.6 mmol/L (ref 3.4–4.8)
PROTEIN TOTAL: 5 g/dL — ABNORMAL LOW (ref 5.7–8.2)
SODIUM: 145 mmol/L (ref 135–145)

## 2021-10-02 LAB — CBC
HEMATOCRIT: 23.1 % — ABNORMAL LOW (ref 34.0–44.0)
HEMOGLOBIN: 7.9 g/dL — ABNORMAL LOW (ref 11.3–14.9)
MEAN CORPUSCULAR HEMOGLOBIN CONC: 34 g/dL (ref 32.0–36.0)
MEAN CORPUSCULAR HEMOGLOBIN: 27.5 pg (ref 25.9–32.4)
MEAN CORPUSCULAR VOLUME: 80.9 fL (ref 77.6–95.7)
MEAN PLATELET VOLUME: 7.9 fL (ref 6.8–10.7)
PLATELET COUNT: 230 10*9/L (ref 150–450)
RED BLOOD CELL COUNT: 2.86 10*12/L — ABNORMAL LOW (ref 3.95–5.13)
RED CELL DISTRIBUTION WIDTH: 15.9 % — ABNORMAL HIGH (ref 12.2–15.2)
WBC ADJUSTED: 8.1 10*9/L (ref 3.6–11.2)

## 2021-10-02 LAB — MAGNESIUM: MAGNESIUM: 1.6 mg/dL (ref 1.6–2.6)

## 2021-10-02 LAB — PHOSPHORUS: PHOSPHORUS: 4 mg/dL (ref 2.4–5.1)

## 2021-10-02 MED ORDER — METOCLOPRAMIDE 10 MG TABLET
ORAL_TABLET | Freq: Three times a day (TID) | ORAL | 0 refills | 30 days | Status: CP | PRN
Start: 2021-10-02 — End: 2021-11-01
  Filled 2021-10-02: qty 90, 30d supply, fill #0

## 2021-10-02 MED ORDER — HYDROXYZINE HCL 25 MG TABLET
ORAL_TABLET | Freq: Four times a day (QID) | ORAL | 0 refills | 8 days | Status: CP | PRN
Start: 2021-10-02 — End: ?
  Filled 2021-10-02: qty 30, 7d supply, fill #0

## 2021-10-02 MED ORDER — FUROSEMIDE 20 MG TABLET
ORAL_TABLET | Freq: Every day | ORAL | 0 refills | 30 days | Status: CP | PRN
Start: 2021-10-02 — End: 2021-11-01

## 2021-10-02 MED ORDER — DRONABINOL 2.5 MG CAPSULE
ORAL_CAPSULE | Freq: Every evening | ORAL | 0 refills | 30 days | Status: CP | PRN
Start: 2021-10-02 — End: ?
  Filled 2021-10-02: qty 30, 30d supply, fill #0

## 2021-10-02 MED ORDER — FUROSEMIDE 20 MG PO TABS
ORAL_TABLET | ORAL | 0 refills | Status: AC
  Filled 2021-10-02: qty 30, 30d supply, fill #0

## 2021-10-02 MED ADMIN — metroNIDAZOLE (FLAGYL) IVPB 500 mg: 500 mg | INTRAVENOUS | @ 11:00:00 | Stop: 2021-10-02

## 2021-10-02 MED ADMIN — cefepime (MAXIPIME) 2 g in sodium chloride 0.9 % (NS) 100 mL IVPB-connector bag: 2 g | INTRAVENOUS | @ 15:00:00 | Stop: 2021-10-02

## 2021-10-02 MED ADMIN — scopolamine (TRANSDERM-SCOP) 1 mg over 3 days topical patch 1 mg: 1 | TOPICAL | @ 11:00:00 | Stop: 2021-10-02

## 2021-10-02 MED ADMIN — dexAMETHasone (DECADRON) tablet 0.5 mg: .5 mg | ORAL | @ 14:00:00 | Stop: 2021-10-02

## 2021-10-02 MED ADMIN — influenza vaccine quad (FLUARIX, FLULAVAL, FLUZONE) (6 MOS & UP) 2022-23: .5 mL | INTRAMUSCULAR | @ 16:00:00 | Stop: 2021-10-02

## 2021-10-02 MED ADMIN — docusate sodium (COLACE) capsule 100 mg: 100 mg | ORAL | @ 14:00:00 | Stop: 2021-10-02

## 2021-10-02 MED ADMIN — Adult Parenteral Nutrition: INTRAVENOUS | @ 05:00:00 | Stop: 2021-10-02

## 2021-10-02 MED ADMIN — cefepime (MAXIPIME) 2 g in sodium chloride 0.9 % (NS) 100 mL IVPB-connector bag: 2 g | INTRAVENOUS | @ 02:00:00 | Stop: 2021-10-10

## 2021-10-02 MED ADMIN — metroNIDAZOLE (FLAGYL) IVPB 500 mg: 500 mg | INTRAVENOUS | @ 04:00:00 | Stop: 2021-10-09

## 2021-10-02 MED ADMIN — pantoprazole (PROTONIX) EC tablet 40 mg: 40 mg | ORAL | @ 14:00:00 | Stop: 2021-10-02

## 2021-10-02 MED ADMIN — senna (SENOKOT) tablet 2 tablet: 2 | ORAL | @ 02:00:00

## 2021-10-02 MED ADMIN — magnesium sulfate 2gm/50mL IVPB: 2 g | INTRAVENOUS | @ 16:00:00 | Stop: 2021-10-02

## 2021-10-02 MED ADMIN — dronabinoL (MARINOL) capsule 2.5 mg: 2.5 mg | ORAL | @ 02:00:00

## 2021-10-02 MED ADMIN — fat emulsion 20 % with fish oil (SMOFLIPID) infusion 250 mL: 250 mL | INTRAVENOUS | @ 05:00:00 | Stop: 2021-10-02

## 2021-10-02 MED ADMIN — heparin, porcine (PF) 100 unit/mL injection 200 Units: 200 [IU] | INTRAVENOUS | @ 18:00:00 | Stop: 2021-10-02

## 2021-10-02 MED ADMIN — fentaNYL (DURAGESIC) 50 mcg/hr 1 patch: 1 | TRANSDERMAL | @ 14:00:00 | Stop: 2021-10-02

## 2021-10-02 NOTE — Unmapped (Signed)
Good Morning!    This patient is discharging from the hospital and will need a follow up appointment in 1-2 weeks after completion of her IV antibiotics on 3/8. Could you please schedule this with Dr. Reyne Dumas?    Thanks you!    Lance Muss, MD PGY3  Obstetrics and Gynecology   Pager: [phone]

## 2021-10-02 NOTE — Unmapped (Signed)
Physician Discharge Summary [facility]  [facility] [place]  [address]  [place] [address]  Dept: [phone]  Loc: [phone]     Identifying Information:   Colleen Russo  1975-10-21  [id number]    Primary Care Physician: Carver Fila, MD   Code Status: Full Code    Admit Date: 09/20/2021    Discharge Date: 10/02/2021     Discharge To: Home with Home Infusion    Discharge Service: [facility] - Hospitalist [place]     Discharge Attending Physician: Arman Filter, MD    Discharge Diagnoses:  Principal Problem:    Hypoglycemia POA: Unknown  Active Problems:    Ovarian cancer (CMS-HCC) POA: Yes    Type 2 diabetes mellitus (CMS-HCC) POA: Unknown    Sick-euthyroid syndrome POA: Unknown    Enterocutaneous fistula POA: Unknown    Abscess POA: Unknown    Poor nutrition POA: Unknown    Anemia of chronic disease POA: Unknown  Resolved Problems:    * No resolved hospital problems. *      Outpatient Provider Follow Up Issues:   [  ] weekly CMP and CBC with diff while on IV antibiotics through 10/09/21  [  ] weekly BMP/Mg/Phos, monthly LFTs and TGs for TPN    Hospital Course:   Colleen Russo is a 46 y.o. 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     Refractory Hypoglycemia - H/o Type 2 Diabetes   HbA1c 7.0% on 09/12/21. Patient was recently discharged from Gyn Onc service on 09/12/21 with an order for NPH 20 units daily to take one hour prior to her TPN. She observed symptomatic hypoglycemia to 30s at home and on presentation. Endocrinology consulted. She required significant IV dextrose to stabilize her blood glucose. Initial hypoglycemia labs showed elevated insulin levels with low c-peptide concerning for exogenous insulin as etiology. Repeat hypoglycemia labs 48 hours later showed decreased insulin levels with ketones supporting insulin-mediated cause of hypoglycemia, likely due to improper insulin administration with unpredictable absorption and poor renal clearance. In addition, levofloxacin (which she was taking at the time) has also been associated with hypoglycemia. Glucoses stabilized on IV dextrose and she was transitioned back to TPN. Her glucoses were monitored, and she did not have hyperglycemia necessitating resumption of insulin. Suspect the hyperglycemia she had with TPN during her prior admission that prompted the insulin initiation was steroid-related given her higher doses of dexamethasone. Dexamethasone was tapered with plan to discontinue by 10/05/21, so do not suspect she will need insulin moving forward.     Enterocutaneous Fistula - Abdominal wall abscess - Hx intra-abdominal abscesses  Patient has a history of enterocutaneous fistula and intra-abdominal abscesses requiring drainage, most recently s/p abscess drain placement by [facility]IR 2/7 during prior admission. Also during that admission, she was treated with Zosyn --> levofloxacin/Flagyl planned through 09/22/21. CT A/P on current admission showed reduced abdominal wall fluid collection since drain placement, however, VIR recommended leaving drain in place until EC fistula has resolved, despite low drain output, as they expect the fluid collection will return. Ascitic fluid studies and culture were negative for bacterial peritonitis. She continued to have some cutaneous drainage, and swab from site grew mixed anaerobes and Candida dubliniensis. Initially continued previous levofloxacin/Flagyl but ultimately transitioned to IV  cefepime/Flagyl with plan for 4 week course (2/7-10/09/21) and IV fluconazole for Candida for 14 day course (2/23-10/09/21), per ID recommendations, to treat her intraabdominal abscess.     Polymicrobial bacteremia  She did not have fever but cultures were drawn in setting of refractory hypoglycemia and multiple risk factors for serious infection. Peripheral BCx 2/18 grew B cereus and Staph hominis. Repeat peripheral and central line BCx 2/19 grew Staph epi and Staph hominis. Repeat BCx 2/21 no growth. Bacteremia was most likely central line-associated from PICC for TPN vs due to translocation from [facility] fistula. PICC removed 2/22 for line holiday, and new tunneled central line placed 2/24. She completed over 7 days of IV vancomycin (2/18-2/25/23) per ID recommendations, with course complicated by supratherapeutic levels and AKI (level was still therapeutic after day 7 of therapy).     AKI, not POA  Baseline Cr 0.4 and on admission; Cr increased to peak of 2.59 by hospital day 7, most likely due to vancomycin-associated nephrotoxicity (level peaked at 44 on 09/25/21) and/or contrast-induced nephropathy (received IV contrast 09/22/21) and/or prerenal injury in setting of hypoglycemia and acute infection. She maintained adequate urine output but noticed some lower extremity edema that was treated with IV Lasix. Cr improved to 0.89 by discharge. Her BUN uptrended throughout hospital course to 41 by day of discharge despite the improvement in Cr. This may have reflected the protein content of her TPN, which was decreased at time of discharge.      Recurrent clear cell ovarian carcinoma  See H&P for full history. Patient follows with primary oncologist Dr. Pricilla Holm. She previously underwent radical tumor debulking including total hysterectomy, bilateral salpingo-oophorectomy, infra-gastric omentectomy, and washings on 07/21/2019. She received 6 cycles of carbo/taxol, which was completed on 12/05/19. Between 12/29-08/07/21 and 1/14-09/12/21 the patient was admitted for partial SBO and abdominal wall fluid collection. Biopsy of fluid collection consistent at first for upper GI or pancreatobiliary primary, later felt to be metastatic disease. Her last imaging was a CTAP on 09/09/2021 which revealed with SBO, EC fistula and likely peritoneal carcinomatosis. Her last CA-125 was 180 on 09/06/2021. Repeat CTAP this admission with similar findings. Her dexamethasone was tapered over the course of her admission, with plan to stop on 10/05/21 given no long-term indication per Gyn Onc.     Chronic small bowel obstruction s/p transesophageal gastrostomy - Malnutrition - TPN dependence  On admission, the patient denied abdominal pain, nausea, and vomiting. She previously tolerated clear liquids and small bites of food at home. There was no indication for surgery this admission per Gyn Onc. She was maintained on bowel rest (sips of clear liquid only for comfort). PICC line removed 2/22 and new tunneled line placed 09/27/21 by VIR. Nutrition was consulted and assisted in managing her nutrition regimen. The patient's TPN was restarted on 2/24, and patient tolerated well. She will need to remain on bowel rest and TPN to promote healing of her EC fistula and intraabdominal abscess. Her home scopolamine patch, Reglan PRN, and Compazine PRN were continued. She was also restarted on reduced dose of dronabinol per her request (had previously been discontinued in 08/2021 due to encephalopathy). Her home Zyprexa was also decreased per patient request due to drowsiness. Her TEG (previously placed 09/06/21 for venting/decompression) became retracted and was successfully exchanged by VIR on 10/01/21.     Chronic pain   Palliative Care was consulted on prior admissions and assisted in managing her pain. Her pain was controlled on fentanyl patches and Tylenol PRN. She  expressed desire to wean herself off of opioids and requested to restart low-dose dronabinol for control of insomnia, pain, and nausea.     Anemia of chronic disease   The patient's admit Hgb was 7.9, which is stable from prior admission. She received pRBC transfusion x1 on 2/24 for Hgb 6.9 with more than appropriate response to 10.1, with hemoglobin slowly downtrending over following days without signs of bleeding. Hgb 7.9 on discharge.     Sick euthyroid syndrome  TSH on admission was 0.237 and her FT4 was 0.54.     Peripheral edema  Patient complained of uncomfortable peripheral edema towards end of her hospital course. Edema was likely multifactorial due to fluid retention in setting of resolving AKI and third spacing from increased vascular permeability with cancer and decreased oncotic pressure with hypoalbuminemia. Reassuringly, there were no signs of pulmonary edema or respiratory compromise. She responded very well to Lasix 80 IV x 1 on 2/26 with essential resolution of her peripheral edema. Renal function continued to improve and she maintained good urine output. Patient requested Rx for furosemide PRN swelling to use after discharge.     Procedures:  PICC line removal - 09/25/21  Tunneled line placement - 09/27/21  Transesophageal gastrostomy exchange - 10/01/21  ______________________________________________________________________  Discharge Medications:     Your Medication List      STOP taking these medications    BD POSIFLUSH NORMAL SALINE 0.9 0.9 % injection  Generic drug: sodium chloride     glucose 4 GM chewable tablet     insulin lispro 100 unit/mL injection pen  Commonly known as: HumaLOG     insulin NPH 100 unit/mL injection  Commonly known as: HumuLIN N NPH U-100 Insulin     levoFLOXacin 750 MG tablet  Commonly known as: LEVAQUIN     metroNIDAZOLE 500 MG tablet  Commonly known as: FLAGYL     MORPhine 20 mg/mL concentrated solution     prochlorperazine 10 MG tablet  Commonly known as: COMPAZINE        START taking these medications    cefepime 2 g in sodium chloride 0.9 % 0.9 % 100 mL IVPB  Infuse 2 g into a venous catheter every twelve (12) hours for 8 days.     dronabinoL 2.5 MG capsule  Commonly known as: MARINOL  Take 1 capsule (2.5 mg total) by mouth nightly as needed (sleep and/or nausea. Hold for excess sedation.).     fluconazole 400 mg/200 mL IVPB  Commonly known as: DIFLUCAN  Infuse 200 mL (400 mg total) into a venous catheter daily for 8 days.     furosemide 20 MG tablet  Commonly known as: LASIX  Take 1 tablet (20 mg total) by mouth daily as needed for swelling (weight gain greater than 2 lbs in 24 hrs or 5 lbs in 48 hrs).     metroNIDAZOLE 500 mg/100 mL IVPB  Commonly known as: FLAGYL  Infuse 100 mL (500 mg total) into a venous catheter every eight (8) hours for 8 days.        CHANGE how you take these medications    dexAMETHasone 0.5 MG tablet  Commonly known as: DECADRON  Take 1 tablet (0.5 mg total) by mouth in the morning for 3 days. First dose 10/03/21.  Start taking on: October 03, 2021  What changed:   ?? medication strength  ?? See the new instructions.     metoclopramide 10 MG tablet  Commonly known as: REGLAN  Take 1 tablet (  10 mg total) by mouth every eight (8) hours as needed (nausea/vomiting).  What changed:   ?? when to take this  ?? reasons to take this     OLANZapine 2.5 MG tablet  Commonly known as: ZyPREXA  Take 1 tablet (2.5 mg total) by mouth nightly.  What changed:   ?? when to take this  ?? Another medication with the same name was removed. Continue taking this medication, and follow the directions you see here.        CONTINUE taking these medications    acetaminophen 325 MG tablet  Commonly known as: TYLENOL  Take 2 tablets (650 mg total) by mouth every six (6) hours as needed.     bisacodyL 10 mg suppository  Commonly known as: DULCOLAX  Insert 1 suppository (10 mg total) into the rectum daily as needed.     calcium carbonate 200 mg calcium (500 mg) chewable tablet  Commonly known as: TUMS  Chew 1 tablet (200 mg of elem calcium total) Three (3) times a day as needed.     cycloPHOSphamide 50 mg capsule  Commonly known as: CYTOXAN  Take 1 capsule (50 mg total) by mouth daily . Do not start until discussing with your oncologist     docusate sodium 100 MG capsule  Commonly known as: COLACE  Take 100 mg by mouth daily.     famotidine 20 MG tablet  Commonly known as: PEPCID  Take 20 mg by mouth daily.     fentaNYL 50 mcg/hr patch  Commonly known as: DURAGESIC  Place 1 patch on the skin every third day.     GAS RELIEF 80 (SIMETHICONE) 80 MG chewable tablet  Generic drug: simethicone  Chew 1 tablet (80 mg total) every six (6) hours as needed.     hydrOXYzine 25 MG tablet  Commonly known as: ATARAX  Take 1 tablet (25 mg total) by mouth every six (6) hours as needed for anxiety.     LIDOCAINE PAIN RELIEF 4 % patch  Generic drug: lidocaine  Place 1 patch on the skin daily. Apply to affected area for 12 hours only each day (then remove patch).     omeprazole 40 MG capsule  Commonly known as: PriLOSEC  Take 40 mg by mouth daily.     polyethylene glycol 17 gram/dose powder  Commonly known as: GLYCOLAX  Take 17 g by mouth daily.     scopolamine 1 mg over 3 days  Commonly known as: TRANSDERM-SCOP  Place 1 patch on the skin every third day.     SENNA 8.6 mg tablet  Generic drug: senna  Take 2 tablets by mouth two (2) times a day as needed for constipation.            Allergies:  Carboplatin and Ketamine  ______________________________________________________________________  Pending Test Results (if blank, then none):  Pending Labs     Order Current Status    Referral Laboratory Test, Other Preliminary result          Most Recent Labs:  All lab results last 24 hours -   Recent Results (from the past 24 hour(s))   POCT Glucose    Collection Time: 10/01/21  5:38 PM   Result Value Ref Range    Glucose, POC 124 70 - 179 mg/dL   POCT Glucose    Collection Time: 10/02/21 12:18 AM   Result Value Ref Range    Glucose, POC 107 70 - 179 mg/dL   CBC  Collection Time: 10/02/21  6:48 AM   Result Value Ref Range    WBC 8.1 3.6 - 11.2 10*9/L    RBC 2.86 (L) 3.95 - 5.13 10*12/L    HGB 7.9 (L) 11.3 - 14.9 g/dL    HCT 16.1 (L) 09.6 - 44.0 %    MCV 80.9 77.6 - 95.7 fL    MCH 27.5 25.9 - 32.4 pg    MCHC 34.0 32.0 - 36.0 g/dL    RDW 04.5 (H) 40.9 - 15.2 %    MPV 7.9 6.8 - 10.7 fL Platelet 230 150 - 450 10*9/L   POCT Glucose    Collection Time: 10/02/21  6:53 AM   Result Value Ref Range    Glucose, POC 187 (H) 70 - 179 mg/dL   Comprehensive Metabolic Panel    Collection Time: 10/02/21  6:58 AM   Result Value Ref Range    Sodium 145 135 - 145 mmol/L    Potassium 3.6 3.4 - 4.8 mmol/L    Chloride 109 (H) 98 - 107 mmol/L    CO2 28.0 20.0 - 31.0 mmol/L    Anion Gap 8 5 - 14 mmol/L    BUN 41 (H) 9 - 23 mg/dL    Creatinine 8.11 (H) 0.60 - 0.80 mg/dL    BUN/Creatinine Ratio 46     eGFR CKD-EPI (2021) Female 82 >=60 mL/min/1.74m2    Glucose 204 (H) 70 - 179 mg/dL    Calcium 8.5 (L) 8.7 - 10.4 mg/dL    Albumin 2.2 (L) 3.4 - 5.0 g/dL    Total Protein 5.0 (L) 5.7 - 8.2 g/dL    Total Bilirubin <9.1 (L) 0.3 - 1.2 mg/dL    AST 15 <=47 U/L    ALT <7 (L) 10 - 49 U/L    Alkaline Phosphatase 49 46 - 116 U/L   Magnesium Level    Collection Time: 10/02/21  6:58 AM   Result Value Ref Range    Magnesium 1.6 1.6 - 2.6 mg/dL   Phosphorus Level    Collection Time: 10/02/21  6:58 AM   Result Value Ref Range    Phosphorus 4.0 2.4 - 5.1 mg/dL       Relevant Studies/Radiology (if blank, then none):  ECG 12 Lead    Result Date: 10/01/2021  NORMAL SINUS RHYTHM WITHIN NORMAL LIMITS WHEN COMPARED WITH ECG OF 26-Aug-2021 11:42, NO SIGNIFICANT CHANGE WAS FOUND Confirmed by Huel Coventry [phone]) on 10/01/2021 1:24:36 PM    ECG 12 Lead    Result Date: 09/30/2021  NORMAL SINUS RHYTHM NORMAL ECG WHEN COMPARED WITH ECG OF 26-Sep-2021 16:13, NO SIGNIFICANT CHANGE WAS FOUND Confirmed by Freeman Caldron ([id number]) on 09/30/2021 2:10:36 PM    XR Chest Portable    Result Date: 10/01/2021   EXAM: XR CHEST PORTABLE DATE: 10/01/2021 9:21 AM ACCESSION: [id number] UN DICTATED: 10/01/2021 10:42 AM INTERPRETATION LOCATION: [facility] CLINICAL INDICATION: 46 years old Female with NGT (CATHETER VASCULAR FIT & ADJ)  COMPARISON: 08/28/2021 chest radiograph. TECHNIQUE: Portable Chest Radiograph. FINDINGS: Esophagogastric tube courses over the chest with distal pigtail coiled over the upper mediastinum. Right IJ approach central venous catheter tip terminates over the superior cavoatrial junction. Adequate lung volumes. No focal consolidation or pulmonary edema. No pleural effusion or pneumothorax. Unremarkable cardiomediastinal silhouette.     Malpositioned percutaneous esophagogastric tube with pigtail projecting over the upper mediastinum. Per chart review, care team aware of findings at time of dictation.    CT Abdomen Pelvis W Contrast    Result Date:  09/22/2021  EXAM: CT ABDOMEN PELVIS W CONTRAST DATE: 09/22/2021 4:51 PM ACCESSION: [id number] UN DICTATED: 09/22/2021 6:18 PM INTERPRETATION LOCATION: [facility] CLINICAL INDICATION: 46 years old with purulent discharge from [facility] fistula  COMPARISON: CT abdomen and pelvis 09/09/2021 TECHNIQUE: A helical CT scan of the abdomen and pelvis was obtained following IV contrast from the lung bases through the pubic symphysis. Images were reconstructed in the axial plane. Coronal and sagittal reformatted images were also provided for further evaluation. FINDINGS: LOWER CHEST: Area of linear/subsegmental atelectasis seen throughout the right lower lobe. ABDOMEN/PELVIS: LIVER: Diffuse hepatic steatosis. Ill-defined region of hypoattenuation seen along the falciform ligament, likely reflects focal fatty deposition. No other focal hepatic lesion identified. BILIARY: Status-post cholecystectomy. Common bile duct is somewhat prominent in appearance, however, gradually tapers to the level of the ampulla without obstructing stone or mass lesion identified. This is a nonspecific finding, and may be seen in the setting of postcholecystectomy state. SPLEEN: Normal in size and contour. PANCREAS: Atrophic. No focal parenchymal abnormality identified. ADRENAL GLANDS: Normal appearance of the adrenal glands. KIDNEYS/URETERS: Symmetric nephrograms. No enhancing solid renal mass lesion. No evidence of hydronephrosis. BLADDER: Moderately distended, but is otherwise unremarkable in appearance. REPRODUCTIVE ORGANS: The uterus is surgically absent. No adnexal mass lesion identified. GI TRACT/PERITONEUM: Esophagogastric tube with tip coiled within the stomach. Similar diffusely dilated small bowel loops tethered to the anterior abdomen as well as to multiple metastatic peritoneal lesions/disease. There are multiple candidate transition points (for example 2:75 and 2:91) and the distal small bowel is decompressed. Additionally, the mucosa of the affected bowel is slightly increased in enhancement. 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 Near complete resolution of the previously seen calcifications within the collection in the anterior abdominal wall soft tissues status post drain placement (2:86). Multiple locules of gas in the anterior central abdomen (for example 2:74) not definitively communicating with the enterocutaneous fistula. Moderate volume diffuse abdominal pelvic ascites with new peritoneal enhancement (for example 2:58). Symmetric appearance of the esophagogastric tube with the distal tip coiled within the stomach.  Nondilated appendix (2:110). Similar asymmetric thickening of the stomach wall (2:38). LYMPH NODES: No adenopathy. VESSELS: Hepatic and portal veins are patent.  Normal caliber aorta.  BONES and SOFT TISSUES: No aggressive osseous lesions.  Enterocutaneous fistulas above. Similar enhancing subcutaneous soft tissue implants for example in the left lower quadrant at 2:65. Small focus of gas in the left lateral abdominal wall (2:57).     1. Persistent dilatation of small bowel loops with several transition points compatible with complex small bowel obstruction with likely closed loop component. There is slight interval increase in enhancement of the affected bowel mucosa. Recommend correlation with lactate and/or surgical consultation for ischemia. 2. Scattered foci of gas in the anterior peritoneum. These do not definitively technique with the enterocutaneous fistula and may represent focal walled off perforation. 3. New enhancement of peritoneal ascites concerning for peritonitis. 4. Decreased size of gas and fluid containing collection in the left anterior abdominal wall arising from the known enterocutaneous fistula status post drain placement. The findings of this study (new peritonitis and persistent closed small bowel obstruction) were discussed via telephone with DR. Leonardo Marucci by Dr. Lucienne Capers on 09/22/2021 6:40 PM. ==================== ADDENDUM: (09/22/2021 8:26 PM) On review, the following additional findings were noted: Limited exam in the absence of oral contrast. Redemonstration of a complex enterocutaneous fistula with associated soft tissue stranding at the level of the anterior midline abdomen. There has been  interval placement of a pigtail drainage catheter within the previously-described air and fluid collection along the left lower anterior abdominal wall, which is significantly decreased in size when compared to prior exam. Numerous persistently dilated loops of small bowel are seen throughout the abdomen and pelvis, which demonstrate air-fluid levels and measure up to 3.7 cm in diameter. There is similar tethering of the small bowel along the anterior peritoneum with abrupt collapse at this level, thought likely to reflect a transition point (2:72). The distal small bowel appears mostly decompressed to the level of the cecum. These findings remain worrisome for small bowel obstruction. Low attenuation abdominopelvic ascites (~18 HU) and diffuse peritoneal thickening/enhancement is similar to prior exam. While these findings, could potentially be seen in the setting of peritonitis, could also be seen with diffuse peritoneal disease spread and associated malignant ascites. Recommend clinical correlation. Elsewhere throughout the abdomen multiple loops of small demonstrate short segments of wall thickening, hyperenhancement, and luminal narrowing (2:63; 2:94), which may reflect sites of serosal involvement and associated stricturing. -----------------------------------------------    IR Insert Tunneled Catheter (Age Greater Than 5 Years)    Result Date: 09/27/2021  EXAM: TUNNELED, POWERLINE CENTRAL VENOUS CATHETER PLACEMENT - ULTRASOUND AND FLUOROSCOPIC-GUIDED DATE: 09/27/2021 9:02 AM ACCESSION: [id number] UN DICTATED: 09/27/2021 9:18 AM INTERPRETATION LOCATION: [facility] CLINICAL INDICATION: 50 years year old Female: requiring long-term central venous access for IV TPN. Persistent EC fistula requiring long - term TPN CONSENT: Written informed consent was obtained after a thorough discussion with the patient about the risks including, but not limited to, infection, bleeding, catheter dysfunction, arrhythmia, air embolism and need for an additional procedure. ULTRASOUND EVALUATION: With the patient in the supine position, the right neck and upper chest were prepped and draped using all elements of maximal sterile barrier technique. The right internal jugular vein was evaluated by ultrasound and was compressible and patent. An ultrasound image was saved and sent to PACS. An appropriate skin entry site was identified using ultrasound and anesthetized with 1% lidocaine with epinephrine. POWERLINE CATHETER PLACEMENT: A small skin incision was made, through which, the right internal jugular vein was accessed using a 21-G needle under ultrasound guidance. A 0.018 inch guidewire was advanced into the vein under fluoroscopic guidance. The access needle was exchanged for a peel-away sheath. The 0.018 inch wire was then used under fluoroscopy in order to determine the appropriate intravascular catheter length. A site below the inferior margin of the clavicle was identified as the tunnel exit site and was anesthetized with local anesthesia. Using a #11 blade, a small incision was made and the catheter was tunneled through the chest incision to the lateral neck dermatotomy and cut to length. Under fluoroscopy, the catheter was advanced through the peel-away sheath and positioned under fluoroscopy with the tip in the proximal right atrium. A fluoroscopic image was saved that confirmed the catheter position. The catheter lumens aspirated and flushed freely. They were then instilled with appropriate volumes of heparin 100 units/mL. The neck dermatotomy was closed with skin adhesive and the catheter was sutured to the skin with 2-0 Ethilon and dressed in a sterile manner. SEDATION: I personally spent 25 minutes, continuously monitoring the patient face-to-face during the administration of moderate sedation. Radiology nurse was present for the duration of the procedure to assist in patient monitoring. Pre and Post Sedation activities have been reviewed. FLUOROSCOPIC TIME: 0.3 minutes EXPOSURES: 0 CUMULATIVE DOSE: 1 mGy     Successful placement of a 6-F, dual lumen, tunneled Powerline catheter in the  right internal jugular vein under ultrasound and fluoroscopy. I, Dr. Trude Mcburney, was present for and supervised the critical portions of the procedure.    IR Paracentesis Abdomen W Imaging    Result Date: 09/26/2021  EXAM: US-GUIDED PERCUTANEOUS PARACENTESIS DATE: 09/24/2021 4:21 PM ACCESSION: [id number] UN DICTATED: 09/26/2021 8:36 AM INTERPRETATION LOCATION: [facility] CLINICAL INDICATION: 46 years old Female: Rule out SBP  CONSENT: Informed consent was obtained from the patient including a discussion of the alternatives, benefits, and risks including but not limited to infection, bleeding, need for additional procedure, and/or ICU admission. US GUIDED PARACENTESIS: The procedure was performed with the patient lying in the supine position. Limited ultrasound  images of the abdomen were obtained demonstrating a drainable pocket of ascites in the right lower quadrant. A skin entry  site was identified and an ultrasound image was saved and sent to PACS. The skin entry site was prepped and draped using all  elements of maximal sterile barrier technique. A 7 cm 18-G needle was inserted into fluid under real time ultrasound guidance. After entry into the fluid  collection, fluid was aspirated Aspiration of the needle returned 150 mL of serous fluid. A specimen was sent for culture. The needle was  removed, and the puncture site was dressed in a sterile manner. SEDATION: none     Successful US-guided paracentesis with evacuation of 150 ml of serous fluid.     IR Replacement G-Tube    Result Date: 10/01/2021  EXAM: GASTROSTOMY TUBE EXCHANGE-FLUOROSCOPIC GUIDED DATE: 10/01/2021 12:10 PM ACCESSION: [id number] UN DICTATED: 10/01/2021 1:29 PM INTERPRETATION LOCATION: [facility] CLINICAL INDICATION: 30 years year old Female with indwelling indwelling percutaneous transesophageal gastrostomy tube that has retracted. CONSENT: Written informed consent was obtained after a discussion with the patient about the risks including, but not limited to, infection, bleeding, injury to the organ, and need for an additional procedure. GASTROSTOMY TUBE EXCHANGE: The patient was positioned supine on the fluoroscopy table.  The indwelling tube and skin surrounding it were prepped and draped using all elements of maximal sterile barrier technique. Through the indwelling 14-F 45 cm pigtail catheter, a 0.035 in Glidewire was advanced through the esophagus and into the stomach. Once the wire was confirmed to be within the stomach the indwelling 14 French 45 cm pigtail catheter was exchanged over the wire for a new 14 French 45 cm pigtail catheter. The locking pigtail was formed. Contrast was then injected through the tube under fluoroscopy to insure proper position. SEDATION: I personally spent 8 minutes, continuously monitoring the patient face-to-face during the administration of moderate sedation. Radiology nurse was present for the duration of the procedure to assist in patient monitoring. Pre and Post Sedation activities have been reviewed. FLUOROSCOPY TIME: 1.1 minutes EXPOSURES: 0 CONTRAST: 10 mL Omnipaque 300 TOTAL DOSE AREA PRODUCT: 30 uGym2 CUMULATIVE DOSE: 1 mGy     Successful replacement of 14 French 45 cm percutaneous transesophageal gastrostomy tube under fluoroscopy. Attestation Signer name: Melynda Ripple I attest that I was present for the entire procedure. I reviewed the stored images and agree with the report as written.     XR Abdomen 1 View    Result Date: 10/01/2021   EXAM: XR ABDOMEN 1 VIEW DATE: 10/01/2021 1:45 AM ACCESSION: [id number] UN DICTATED: 10/01/2021 1:52 AM INTERPRETATION LOCATION: [facility] CLINICAL INDICATION: 46 years old Female with CATHETER NON VASCULAR FIT&ADJ  COMPARISON: CT abdomen/pelvis dated 09/22/2021 and prior. TECHNIQUE: Supine view of the abdomen. FINDINGS: Left lower quadrant drainage catheter. Right upper quadrant surgical clips. No  dilated loops of bowel in the partially visualized abdomen. No acute osseous abnormality. No soft tissue masses.     -- Left lower quadrant drainage catheter. Nonobstructed bowel gas pattern.    ______________________________________________________________________  Discharge Instructions:     Diet Instructions     Discharge diet (specify)      Discharge Nutrition Therapy: Nothing by Mouth    Only sips of clear liquids for comfort and with meds               Follow Up instructions and Outpatient Referrals     Call MD for:  difficulty breathing, headache or visual disturbances      Call MD for:  extreme fatigue      Call MD for:  persistent dizziness or light-headedness      Call MD for:  persistent nausea or vomiting      Call MD for:  severe uncontrolled pain      Call MD for: Temperature > 38.5 Celsius ( > 101.3 Fahrenheit)      Discharge instructions      Referral to Home Infusion      Performing location?: [facility] Requested Disciplines: Nursing     **Please contact your service pharmacist for assistance with discharge   home health infusion monitoring.          Other Instructions     Call MD for:  difficulty breathing, headache or visual disturbances      Call MD for:  extreme fatigue      Call MD for:  persistent dizziness or light-headedness      Call MD for:  persistent nausea or vomiting      Call MD for:  severe uncontrolled pain      Call MD for: Temperature > 38.5 Celsius ( > 101.3 Fahrenheit)      Discharge instructions      You were admitted to the hospital with low blood sugars. This was likely due to receiving too much insulin or possibly mixing up the type of insulin you were taking. You were also found to have an infection in your bloodstream, which may have contributed to the low sugars.     You were treated with IV antibiotics with improvement in your infection. You will need to continue taking IV antibiotics for another week for the infection in your abdomen. You will follow up with Infectious Diseases at the end of your antibiotic course to ensure it's safe for you to stop taking the antibiotics.    We stopped your insulin while you were here. We do not think you need insulin moving forward. Your sugars were likely high during your last admission because you were on a high dose of a steroid called dexamethasone, which can cause high sugars. You do not have a long-term indication to take this medication, so you were weaned down on this medication and will be able to stop taking it later this week. We do not suspect you will have problems with high sugars requiring you to take insulin now that you will no longer be taking dexamethasone.     At your request, we adjusted some of your pain and nausea medications. You can continue to use your fentanyl patch for baseline pain control. We decreased your olanzapine (Zyprexa) to just 2.5 mg at nighttime at your request (this medication was being used to treat nausea). We started you on dronabinol (Marinol) at bedtime to help with sleep and nausea. You seemed  to have sedation with higher doses, so we recommend that you take 2.5 mg at bedtime as needed, and don't take if you're too sedated.    You had some swelling in your legs while you were here, likely because you had a mild kidney injury that caused you to accumulate fluid on your body. Your kidney function is improving, and your swelling is much better now that you're discharging. However, if you do find that your swelling is returning or you have gained a significant amount of weight in a short period of time (2 lbs in 24 hours or 5 lbs in 48 hours), this can be a sign that you are accumulating fluid again, and you can take a dose of furosemide (Lasix) 20 mg as needed. Do not take more than one dose in a day and do not take if you are feeling lightheaded or dizzy.    Discharge instructions to patient: Call your primary care doctor and make an appointment to see them:      Within 2 weeks from the time you are discharged from the hospital          Appointments which have been scheduled for you    Oct 07, 2021  1:00 PM  (Arrive by 12:30 PM)  NURSE LAB DRAW with ADULT ONC LAB  [facility] ADULT ONCOLOGY LAB DRAW STATION [place] [facility] REGION) [address]  [place] [address]  [phone]      Oct 07, 2021  2:00 PM  (Arrive by 1:30 PM)  LEVEL 120 with [employer] CHAIR 19  [place] ONCOLOGY INFUSION [place] [facility] REGION) [address]  [place] [address]  [phone]      Oct 08, 2021 10:00 AM  (Arrive by 9:45 AM)  HOSPITAL FOLLOW UP with Althia Forts, MD  [facility] INFECTIOUS DISEASES [place] [facility] REGION) [address]  [place] [address]  [phone]      Oct 28, 2021  1:00 PM  (Arrive by 12:30 PM)  NURSE LAB DRAW with ADULT ONC LAB  [facility] ADULT ONCOLOGY LAB DRAW STATION [place] [facility] REGION) [address]  [place] [address]  [phone]      Oct 28, 2021 2:00 PM  (Arrive by 1:30 PM)  LEVEL 120 with [employer] CHAIR 21  [place] ONCOLOGY INFUSION [facility] REGION) [address]  [place] [address]  [phone]      Nov 14, 2021 12:30 PM  (Arrive by 12:00 PM)  NEW PALLIATIVE CARE with Jobe Marker, MD  [facility] ONCOLOGY MULTIDISCIPLINARY 2ND FLR CANCER HOSP [facility] REGION) [address]  [place] [address]  [phone]      Nov 18, 2021  1:00 PM  (Arrive by 12:30 PM)  NURSE LAB DRAW with ADULT ONC LAB  [facility] ADULT ONCOLOGY LAB DRAW STATION [place] [facility] REGION) [address]  [place] [address]  [phone]      Nov 18, 2021  2:00 PM  (Arrive by 1:30 PM)  LEVEL 120 with [employer] CHAIR 21   ONCOLOGY INFUSION [place] [facility] REGION) [address]  [place] [address]  [phone]      Dec 09, 2021  1:00 PM  (Arrive by 12:30 PM)  NURSE LAB DRAW with ADULT ONC LAB  [facility] ADULT ONCOLOGY LAB DRAW STATION [place] REGION) [address]  [place] [address]  [phone]      Dec 09, 2021  2:00 PM  (Arrive by 1:30 PM)  LEVEL 120 with [employer] CHAIR 21  [place] ONCOLOGY INFUSION [place] [facility] REGION) [address]  [place] [address]  [phone]           ______________________________________________________________________  Discharge Day Services:  BP 126/87  - Pulse 83  - Temp 37.1 ??C (98.8 ??F) (Oral)  - Resp 20  - Ht 157.5 cm (5' 2)  - Wt 74.6 kg (164 lb 7.4 oz)  - SpO2 97%  - BMI 30.08 kg/m??   Pt seen on the day of discharge and determined appropriate for discharge.    Condition at Discharge: fair    Length of Discharge: I spent greater than 30 mins in the discharge of this patient.

## 2021-10-02 NOTE — Unmapped (Signed)
PAtient alert and oriented x4, still having abdominal pains, relieved with pain medications, TPN stopped at noon time and will infuse another one at midnight. Left chest percutaneous transesophageal gastrostomy tube exchanged at [facility] this afternoon, procedure tolerated well, tube secured with statlock. Will continue to monitor.  Problem: Adult Inpatient Plan of Care  Goal: Plan of Care Review  Outcome: Progressing  Flowsheets (Taken 10/01/2021 1806)  Progress: improving  Plan of Care Reviewed With: patient  Goal: Patient-Specific Goal (Individualized)  Outcome: Progressing  Goal: Absence of Hospital-Acquired Illness or Injury  Outcome: Progressing  Intervention: Identify and Manage Fall Risk  Recent Flowsheet Documentation  Taken 10/01/2021 0800 by Francene Finders, RN  Safety Interventions:   aspiration precautions   low bed   nonskid shoes/slippers when out of bed   infection management  Intervention: Prevent and Manage VTE (Venous Thromboembolism) Risk  Recent Flowsheet Documentation  Taken 10/01/2021 0800 by Francene Finders, RN  Activity Management: activity adjusted per tolerance  Goal: Optimal Comfort and Wellbeing  Outcome: Progressing  Goal: Readiness for Transition of Care  Outcome: Progressing  Goal: Rounds/Family Conference  Outcome: Progressing     Problem: Self-Care Deficit  Goal: Improved Ability to Complete Activities of Daily Living  Outcome: Progressing     Problem: Skin Injury Risk Increased  Goal: Skin Health and Integrity  Outcome: Progressing  Intervention: Optimize Skin Protection  Recent Flowsheet Documentation  Taken 10/01/2021 0800 by Francene Finders, RN  Head of Bed [facility]) Positioning: HOB elevated     Problem: Heart Failure Comorbidity  Goal: Maintenance of Heart Failure Symptom Control  Outcome: Progressing     Problem: Hypertension Comorbidity  Goal: Blood Pressure in Desired Range  Outcome: Progressing     Problem: Pain Chronic (Persistent) (Comorbidity Management)  Goal: Acceptable Pain Control and Functional Ability  Outcome: Progressing

## 2021-10-02 NOTE — Unmapped (Signed)
Alert and oriented. Denies any pain. TPN and fat emulsion administered, tolerated well. Kept NPO except meds. Uneventful shift.     Problem: Adult Inpatient Plan of Care  Goal: Plan of Care Review  Outcome: Ongoing - Unchanged  Goal: Patient-Specific Goal (Individualized)  Outcome: Ongoing - Unchanged  Flowsheets (Taken 10/02/2021 0029)  Patient-Specific Goals (Include Timeframe): will be free from fall/injury  Individualized Care Needs: monitor blood sugar, drainage  Goal: Absence of Hospital-Acquired Illness or Injury  Outcome: Ongoing - Unchanged  Intervention: Identify and Manage Fall Risk  Recent Flowsheet Documentation  Taken 10/01/2021 2000 by Cindie Crumbly, RN  Safety Interventions:  ??? low bed  ??? lighting adjusted for tasks/safety  ??? nonskid shoes/slippers when out of bed  Intervention: Prevent and Manage VTE (Venous Thromboembolism) Risk  Recent Flowsheet Documentation  Taken 10/01/2021 2000 by Cindie Crumbly, RN  Activity Management: activity adjusted per tolerance  Intervention: Prevent Infection  Recent Flowsheet Documentation  Taken 10/01/2021 2000 by Cindie Crumbly, RN  Infection Prevention:  ??? environmental surveillance performed  ??? hand hygiene promoted  Goal: Optimal Comfort and Wellbeing  Outcome: Ongoing - Unchanged  Goal: Readiness for Transition of Care  Outcome: Ongoing - Unchanged  Goal: Rounds/Family Conference  Outcome: Ongoing - Unchanged     Problem: Self-Care Deficit  Goal: Improved Ability to Complete Activities of Daily Living  Outcome: Ongoing - Unchanged     Problem: Skin Injury Risk Increased  Goal: Skin Health and Integrity  Outcome: Ongoing - Unchanged  Intervention: Optimize Skin Protection  Recent Flowsheet Documentation  Taken 10/01/2021 2000 by Cindie Crumbly, RN  Pressure Reduction Techniques: frequent weight shift encouraged  Pressure Reduction Devices: positioning supports utilized     Problem: Heart Failure Comorbidity  Goal: Maintenance of Heart Failure Symptom Control  Outcome: Ongoing - Unchanged     Problem: Hypertension Comorbidity  Goal: Blood Pressure in Desired Range  Outcome: Ongoing - Unchanged     Problem: Pain Chronic (Persistent) (Comorbidity Management)  Goal: Acceptable Pain Control and Functional Ability  Outcome: Ongoing - Unchanged

## 2021-10-02 NOTE — Unmapped (Signed)
[place] Infectious [facility] Discharge Plan and Sign Off Note    Colleen Russo  Referring Service/Physician: Medicine  ID Attending: Luiz Ochoa  ID Fellow: Threasa Alpha  ??  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 At prior hospitalization, she underwent IR drain placement 2/7 for abscess (culture with Citrobacter and Pseudomonas, Strep anginosus) and was transition to levofloxacin/flagy with plan to continue through 2/19. 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  ??  Patient has been on broad spectrum abx since 2/7 with drain placement. At this time we elected to continue antibiotics through 3/8 as this would cover her polymicrobial bacteremia and give sufficient time for complicated intra-abdominal infection with her known fistula. During her hospitalization we opted to cover candida species as she continued to have purulent drainage while on broad spectrum abx. By day of discharge her wounds were draining feculent material suggestive of ongoing fistulizing process, though surrounding skin around fistula site was not inflammed and no purulence seen.     Discussed with patient that this is a difficult to treat infection as there is ongoing contamination with fecal contents and we have concern for recurrence. We requested ID follow up towards the end of her therapy.     Microbiology and culture source:   2/21 bld cx x2: NGTD  2/19 abdominal swab (taken by nursing at bedside): Per discussion and review of plates with microbiology on 2/23, the predominant pathogen is candida dubliniensis w/ 1+ anaerobes. No other gram negatives seen. This isolate was referred to susceptibility testing 09/26/21 w/ plan to result by 10/03/21  2/19 bld cx x2: Staph hominis in 2/2 AND Staph epi in 2/2 (Mec A detected)  2/18 bld cx: Bacillus cereus (2/2), Staph hominis (2/2)  2/17 ucx neg  2/7 abd aspirate: gram: 2+ GPR. Cx: 4+ mixed GP/GN orgs, incl. 3+ Citrobacter freundii (R unasyn, aztreo, cefazolin, ceftaz, ctx, I zosyn)), 2+ PsA (pan suscep), 4+ Strep anginosus  1/28 HIV neg  ??  Antibiotics and Dose: Fluconazole, metronidazole, cefepime (which all are renally dose adjusted)  Antibiotic Start Date: 09/10/21 (Date of drain placement)  Anticipated Antibiotic Stop Date (contingent upon): 10/09/21    Allergies:   Carboplatin and Ketamine    IV access and date inserted: 09/29/21    Labs to be drawn after discharge (including frequency): weekly  _x_ CBC with differential   _x_ BUN/Creatinine   _x_ Liver enzyme panel   ___ Trough value for vancomycin, aminoglycoside (Goal trough ___________)   ___ Other (e.g. CK)    Fax labs to attention of:   [name]  [phone]    When possible, weekly labs should be drawn on Monday or Tuesday.     Studies related to ID consultation still pending:        Susceptibility testing of candida dubliniensis         Outpatient ID Fellow (  if different from above): Pt will follow up with Dr Forrestine Him though labs can be faxed to [facility]  Outpatient ID Attending:   Outpatient PCP or Referring Physician:     Follow-up ID Appointment: (date/time/physician)   Appointments which have been scheduled for you    Oct 07, 2021  1:00 PM  (Arrive by 12:30 PM)  NURSE LAB DRAW with ADULT ONC LAB  [facility] ADULT ONCOLOGY LAB DRAW STATION [place] [facility] REGION) [address]  [place] [address]  [phone]      Oct 07, 2021  2:00 PM  (Arrive by 1:30 PM)  LEVEL 120 with [employer] CHAIR 19  [place] ONCOLOGY INFUSION [facility] REGION) [address]  [place] [address]  [phone]      Oct 08, 2021 10:00 AM  (Arrive by 9:45 AM)  HOSPITAL FOLLOW UP with Althia Forts, MD  [facility] INFECTIOUS DISEASES [place] [facility] REGION) [address]  [place] [address]  [phone]      Oct 28, 2021  1:00 PM  (Arrive by 12:30 PM)  NURSE LAB DRAW with ADULT ONC LAB  [facility] ADULT ONCOLOGY LAB DRAW STATION [place] [facility] REGION) [address]  [place] [address]  [phone]      Oct 28, 2021  2:00 PM  (Arrive by 1:30 PM)  LEVEL 120 with [employer] CHAIR 21  [place] ONCOLOGY INFUSION [place] [facility] REGION) [address]  [place] [address]  [phone]      Nov 14, 2021 12:30 PM  (Arrive by 12:00 PM)  NEW PALLIATIVE CARE with Jobe Marker, MD  [facility] ONCOLOGY MULTIDISCIPLINARY 2ND FLR CANCER HOSP [facility] REGION) [address]  [place] [address]  [phone]      Nov 18, 2021  1:00 PM  (Arrive by 12:30 PM)  NURSE LAB DRAW with ADULT ONC LAB  [facility] ADULT ONCOLOGY LAB DRAW STATION [place] [facility] REGION) [address]  [place] [address]  [phone]      Nov 18, 2021  2:00 PM  (Arrive by 1:30 PM)  LEVEL 120 with [employer] CHAIR 21  [place] ONCOLOGY INFUSION [facility] REGION) [address]  [place] [address]  [phone]      Dec 09, 2021  1:00 PM  (Arrive by 12:30 PM)  NURSE LAB DRAW with ADULT ONC LAB  [facility] ADULT ONCOLOGY LAB DRAW STATION [place] [facility] REGION) [address]  [place] [address]  [phone]      Dec 09, 2021  2:00 PM  (Arrive by 1:30 PM)  LEVEL 120 with [employer] CHAIR 21  [place] ONCOLOGY INFUSION  [facility] REGION) [address]  [place] [address]  [phone]             If this appointment needs to be rescheduled, please have the patient call [phone]    CC to: ____________________ (referring MDs, outpatient ID fellow & attending, Inpatient ID attending, ID clinic nurse)

## 2021-10-02 NOTE — Unmapped (Signed)
GYN ONC PROGRESS NOTE    Assessment and Plan:  Colleen Russo is a 46 y.o. 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 Now with replaced PICC line on IV antibiotics and fungal coverage. TEG exchanged yesterday after displacement. Remains with blood sugars at goal plans for discharge today.     Onc: Recurrent Clear Cell Carcinoma of the Ovary   - Primary Oncologist: [name]  - s/p XL??with??radical tumor debulking including??total hysterectomy,??bilateral salpingo-oophorectomy, infra-gastric??omentectomy, and??washings??on 07/21/2019  -??s/p 6 cycles carbo/taxol, completed 12/05/19  - CT 2/022 concerning for progression, started on carbo/gem 2/22-5/24/22.  - CT 12/2020 stable disease  - 08/2021 recurrent disease, abdominal mass bx  - 09/2021 recurrent SBO, likely closed loop c/b intraabdominal infection with JP drain placed 2/7  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  - Chemo plan: cyclophosphamide, keytruda after completion of abx  - Last CA-125:??180 on 09/06/2021  - Will request for follow up appointment with GynOnc    Refractory Hypoglycemia, resolved - T2DM   - Discharged 2/9 on NPH 20 units daily to take 1 hr prior to TPN > attempted to decrease as an outpatient and ultimately discontinued given symptomatic lows, last took subq insulin 2/16   - Per Endocrine, unclear etiology  - Management per Primary Team, Endocrine following, appreciate recommendations; signed off   - No longer on dextrose IV after PN starts   - Dexamethasone taper per Medicine   - No indication for longterm steroids from GynOnc perspective, Dex taper per primary  team   - BG at goal, currently not requiring insulin in TPN    Enterocutaneous Fistula - Hx abscesses - Bacteremia, new abdominal ascites  - History of enterocutaneous fistula,??and abscesses requiring drainage.  - Most recently, abscess drain placement by VIR 2/7, initially on Zosyn > s/p Levofloxacin and Flagyl   - BCx positive   - 2/19 Staph epi, S. hominis, MRSA, Bacillus cereus   - ID consulted likely component of PICC colonization and intraabdominal source   - 2/21 Repeat BCx NGTD  - 2/19 Abdominal/cutaneous Cx; 1+ Candida dubliniensis> Fluconazole (2/23- )  - 2/19 repeat CTAP with redemonstration of ECF, decreased air and fluid collection along left lower anterior abdominal wall which is significantly decreased s/p VIR drain 2/7 making new abdominal abscess less likely, however bowel edema with possible ischemic component  - 2/21 VIR paracentesis 150cc amber fluid   - Culture NGTD   - Cytology pending   - Protein 2.8, Glucose 69, Albumin 1.6   - Micro most consistent with intraabdominal and cutaneous etiology of bacteremia  - Abdominal exam remains benign, patient afebrile and clinically well appearing without concerns for peritonitis  - Plan for: Total 4 weeks of  IV Cefepime/Flagyl/Fluconazole, end 3/8, follow up with ID 3/7    Small bowel obstruction - TEG  - s/p TEG placement on 2/3  - Home reg: scopolamine patch, using Reglan and Compazine PRN  - 2/19 CTAP persistent small bowel obstruction, likely closed loop component with slight interval increase in enhancement of affected bowel mucosa  - 2/20 Colorectal surgery cs: agree ECF does not appear to be the source of bacteremia given drainage of previously existing fluid collections  - Remains with a benign abdominal exam  - TEG exchanged 2/28, uncomplicated  - Patient previously seen by General  Surgery Dr. Marin Olp with scheduled follow up 3/22. See contact info below     Demetrius Revel, MD   [facility]   NPI: [id number]   [address]   [place] [address]   ??   Phone: [phone]   Fax: [phone]    Sick Euthyroid Syndrome  - Last admit TSH 0.237, FT4 0.54  ????  Chronic Pain  - S/p Palliative Care consult, pain well controlled on 50 mcg fentanyl patches and Tylenol PRN  ??  Poor Nutrition  - S/p Nutrition consult 2/18  - Restarted on TPN 2/27  - Sips for comfort only     Anemia of Chronic Disease, stable  - Admit Hgb 7.9 stable from prior  ??  PPX: LVX  ??  Code Status: full code  ??  Disposition: floor    Patient seen and plan discussed with Dr. Merry Proud and Attending Dr. Donnald Garre immediately available.    Subjective:  Sleepy this morning, no complaints. Looking forward to discharging.     Objective:  Temp:  [37.1 ??C (98.8 ??F)-37.3 ??C (99.1 ??F)] 37.1 ??C (98.8 ??F)  Heart Rate:  [83-105] 83  Resp:  [16-22] 20  BP: (104-126)/(77-87) 126/87  SpO2:  [97 %-100 %] 97 %    -General:                    Well-appearing in NAD.  -CV:                             Regular rate   -Chest:                    Normal WOB  -Abd:                           Non-tender, non-distended abdomen.  ECF dressing clean/dry/intact. TEG in place, bag with bilious output.  -Extremities:              Non edema bilaterally, symmetric and nontender  -Psych:                       Appropriate.The patient is awake and alert.    Medications (scheduled)   ??? Cefepime  2 g Intravenous Q12H [facility]   ??? dexAMETHasone  0.5 mg Oral Daily   ??? docusate sodium  100 mg Oral Daily   ??? dronabinoL  2.5 mg Oral BID   ??? fluconazole (DIFLUCAN) INTRAVENOUS  400 mg Intravenous Q24H   ??? heparin, porcine (PF)  200 Units Intravenous Q MWF   ??? flu vacc qs2022-23 6mos up(PF)  0.5 mL Intramuscular During hospitalization   ??? lidocaine  1 patch Transdermal Daily   ??? metroNIDAZOLE  500 mg Intravenous [facility]   ??? OLANZapine  2.5 mg Oral Nightly   ??? pantoprazole  40 mg Oral Daily   ??? polyethylene glycol  17 g Oral Daily   ??? scopolamine  1 patch Topical Q72H   ??? senna  2 tablet Oral Nightly       Medications (prn)  acetaminophen, bisacodyL, calcium carbonate, metoclopramide, naloxone, ondansetron, simethicone, sorbitoL    Labs:   Lab Results Component Value Date    WBC 8.1 10/02/2021    HGB 7.9 (L) 10/02/2021    HCT 23.1 (L) 10/02/2021    PLT 230 10/02/2021  Lab Results   Component Value Date    NA 146 (H) 10/01/2021    K 4.0 10/01/2021    CL 106 10/01/2021    CO2 32.0 (H) 10/01/2021    BUN 32 (H) 10/01/2021    CREATININE 1.05 (H) 10/01/2021    GLU 163 10/01/2021    CALCIUM 8.5 (L) 10/01/2021    MG 1.8 10/01/2021    PHOS 4.3 10/01/2021       Lab Results   Component Value Date    BILITOT <0.2 (L) 10/01/2021    BILIDIR 0.10 09/11/2021    PROT 5.0 (L) 10/01/2021    ALBUMIN 2.2 (L) 10/01/2021    ALT <7 (L) 10/01/2021    AST 13 10/01/2021    ALKPHOS 48 10/01/2021       Lab Results   Component Value Date    INR 1.14 09/21/2021    APTT 30.7 09/21/2021

## 2021-10-03 MED ORDER — DEXAMETHASONE 0.5 MG TABLET
ORAL_TABLET | Freq: Every day | ORAL | 0 refills | 3 days | Status: CP
Start: 2021-10-03 — End: 2021-10-06
  Filled 2021-10-02: qty 3, 3d supply, fill #0

## 2021-10-05 NOTE — Unmapped (Signed)
GYN Telephone Note    Colleen Russo is a 46 y.o. 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 She was discharged on 10/02/2021 on TPN. GYNONC was not actively managing anything while inpatient.    Patient states she was told to run TPN 12 hours on and 12 hours off. Yesterday during her night time TPN her sugars got to 400 and she stopped for 1.5 hours. She restarted but has not been able to finish her TPN bag again due to need to stop for blood sugars. She'll be due for her next bag technically at 11pm but still hasnt finished her last bag. She overall feels well and no acute complaints.     Instructed patient to call the number she received for the medicine team on call given they were the team primarily managing her hypoglycemia problem and are most familiar with her discharge TPN plan. Patient understands, has the number, and agrees. Given precautions for hypoglycemia or hyperglycemia c/f DKA/HHS    Saintclair Halsted MD  OBGYN

## 2021-10-05 NOTE — Unmapped (Incomplete)
Thank you for coming in today.    Please note that your laboratory and other results may be visible to you in real time, possibly before they reach your provider. Please allow 48 hours for clinical interpretation of these results. Importantly, even if a result is flagged as abnormal, it may not be one that impacts your health.    The ID clinic phone number is [phone] (toll free [phone]).  The ID clinic fax number is [phone].    For urgent issues on nights and weekends you may reach the ID Physician on call through the [facility] Operator at [phone].     Sarita Haver, MSPH, MD  Fellow, [facility] Division of Infectious Diseases  [facility] at [facility]  [address]   [place], [place] [address]

## 2021-10-05 NOTE — Unmapped (Incomplete)
[facility] INFECTIOUS DISEASES [place]  [address]  [place] [address]  Dept: [phone]  Dept Fax: [phone]  Loc: [phone]  {    Coding tips - Do not edit this text, it will delete upon signing of note!    ?? Telephone visits [phone] for Physicians and APP??s and [phone] for Non- Physician Clinicians)- Only use minutes on the phone to determine level of service.    ?? Video visits [phone]) - Use both minutes on video and pre/post minutes to determine level of service.       :[id number]}    The patient reports they are currently: {patient location:81390}. I spent *** minutes on the {phone audio video visit:67489} with the patient on the date of service. I spent an additional *** minutes on pre- and post-visit activities on the date of service.     The patient was physically located in [place] or a state in which I am permitted to provide care. The patient and/or parent/guardian understood that s/he may incur co-pays and cost sharing, and agreed to the telemedicine visit. The visit was reasonable and appropriate under the circumstances given the patient's presentation at the time.    The patient and/or parent/guardian has been advised of the potential risks and limitations of this mode of treatment (including, but not limited to, the absence of in-person examination) and has agreed to be treated using telemedicine. The patient's/patient's family's questions regarding telemedicine have been answered.     If the visit was completed in an ambulatory setting, the patient and/or parent/guardian has also been advised to contact their provider???s office for worsening conditions, and seek emergency medical treatment and/or call 911 if the patient deems either necessary.        Assessment/Plan:     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    ID Problem List:  # Enterocutaneous fistula, abdominal wall abscess, possible peritonitis   - acute, poses threat to life or bodily function  [high]  S/p abscess drain placement by VIR 2/7 w/ repeat CT 2/19 and drain output suggestive that abscess has resolved or nearly resolved. Though, continues to have frank purulence on 09/26/21 exam-- which may represent residual abscess fluid or residual fistula. Perhaps most concerningly, has poor prognosis for bowel obstruction and for healing her enterocutaneous fistula, per gyn-onc, so will likely remain at high risk for persistent or new intraabdominal infections. Abdominal swab (taken by nursing on 2/19) does show anaerobes and candida dubliniensis. While this yeast is often a colonizing pathogen, in her context, this may be contributing to ongoing pathogenic infection. As such, would opt to cover this (routinely susceptible to fluconazole, but full susceptibilities will be run/made available around 10/03/21 per discussion with microbiology on 09/26/21).  - Peritoneal studies revealed monocytic predominance of cells (351 clumped nucleated cells, 2% neut) w/ SAAG 0.8 and protein 2.8 which can be consistent with underlying malignancy  -Cytology w/ metastatic disease  -Tx: Vanco + cefepime + flagyl (anaerobes on abd swab 2/19) + fluconazole (added 2/23 due to ongoing drainage and c dubliniensis on swab 2/19).  Tentatively would treat through 10/09/21  - Consider repeat imaging with CT A/P with contrast  - Consider secondary antimicrobial prophylaxis if recurrent soft tissue infections    # Polymicrobial bacteremia with Bacillus cereus, Staph hominis, Staph epidermidis, resolved  Both can be found in the GI tract and also (esp.the Staph) on skin and wounds, so an initial source of the enterocutaneous fistula or elsewhere in the GI tract is most likely, but suspect PICC was infected, so removed 09/25/21. We generally treat true bacteremia of each of these pathogens with at least 7 days of IV antibiotics, sometimes up to 14 days.  - 2/18 BCx with Staph hominis and B cereus group in 2/2, Staph epi in 1/2  - 2/19 BCx with Staph hominis and Staph epi in 2/2 (peripheral, PICC)   - 2/21 BCx x2 NG  - 2/22 PICC removed  ??? Completed 7 day course of vancomycin, 09/24/21-10/01/21  ??    ??    #Long term (current) use of antibiotics  This patient currently requires long term use of antibiotics (>2 weeks). This requires intensive monitoring for toxicity and requires frequent laboratory monitoring for kidney, liver and/or bone marrow function.  ??? Standing orders placed for weekly: CBC w/ differential, CMP      PLAN:  - Continue cefepime, metronidazole, and fluconazole through EOT 10/09/2021  - Weekly CBC w/ diff, CMP  No orders of the defined types were placed in this encounter.        Disposition  No follow-ups on file.    I personally spent 45 minutes face-to-face and non-face-to-face in the care of this patient, which includes all pre, intra, and post visit time on the date of service.            Subjective:   CC/Reason for ID visit:   Colleen Russo is a 46 y.o. female who presents to the Infectious Disease clinic subsequent to recent hospitalization for polymicrobial bacteremia  (B cereus, S hominis, S epidermidis) and abdominal wall abscess with enterocutaneous fistula.     Admit Date: 09/20/21  Discharge Date: 10/02/21    All records from the hospitalization, including H&P, consult notes, discharge summaries, radiology, pathology, laboratory and microbiology results were reviewed in detail and are summarized in the HPI.    BACKGROUND HPI:  Colleen Russo was admitted to [facility] 2/17 with persistent hypoglycemia to 30s despite holding long acting insulin. This started a few days after discharge home 2/10 from her last admission. Reports she experienced periods of lightheadness, decreased peripheral vision and is told she had slurred speech during these episodes, during which her glucose was found to be low. She would also often feel hot or chilled when her sugar was low, not at other times. No rigors.  ??   Reports full adherence to levofloxacin/metronidazole she was taking for abdominal abscess s/p VIR drainage 2/7. She reports that within a day or two after going home the output into her abdominal/fistula site JP drain stopped, with no drainage (other than the fluid she flushes in) since. She has had very scant greenish drainage from the fistula/abscess exit site area- enough to stain overlying dressing but not enough to soak through or really be visible on the skin. Reports her abdominal pain is unchanged.   ??  Blood cultures were drawn, which grew Bacillus cereus and a coag negative Staph in both peripheral blood cultures. She has remained afebrile without infectious symptoms. After initial admission to the MICU, transferred to the floor. When blood  cultures resulted positive, vancomycin was added.   ??  Today, she noticed a R forearm rash with a few dozen small, pinpoint red macules. It is not itchy or painful. No rash or lesions she has noticed elsewhere.     INTERIM HPI:  10/05/21: She reports that she is doing well, with no fever, chills, or night sweats. She feels that she is tolerating the antibiotic therapy well, with no nausea, vomiting, diarrhea, or rash. She does not have any redness, pain, or discharge from her PICC/Powerline site. Her wound is healing well, with no redness, purulent drainage, or excessive pain.     Medications:  Antimicrobials:  Current:  Metronidazole 500 PO q  2/8-  Cefepime 2/22-  Fluconazole 2/23-   ??  Prior:  Levofloxacin 750 IV q 24 2/8 -   Vanc IV 2/18-2/24    Immunomodulators   Dexamethasone taper 2/10-2/17, hydrocortisone/dexamethasone 2/18-present  - last chemo ~9 months ago    Other:    Current Outpatient Medications:   ???  acetaminophen (TYLENOL) 325 MG tablet, Take 2 tablets (650 mg total) by mouth every six (6) hours as needed., Disp: , Rfl: 0  ???  bisacodyL (DULCOLAX) 10 mg suppository, Insert 1 suppository (10 mg total) into the rectum daily as needed., Disp: 12 suppository, Rfl: 0  ???  calcium carbonate (TUMS) 200 mg calcium (500 mg) chewable tablet, Chew 1 tablet (200 mg of elem calcium total) Three (3) times a day as needed., Disp: , Rfl: 0  ???  cefepime 2 g in sodium chloride 0.9 % 0.9 % 100 mL IVPB, Infuse 2 g into a venous catheter every twelve (12) hours for 8 days., Disp: 8 each, Rfl: 0  ???  cyclophosphamide (CYTOXAN) 50 mg capsule, Take 1 capsule (50 mg total) by mouth daily . Do not start until discussing with your oncologist, Disp: 30 capsule, Rfl: 5  ???  dexAMETHasone (DECADRON) 0.5 MG tablet, Take 1 tablet (0.5 mg total) by mouth in the morning for 3 days. First dose 10/03/21., Disp: 3 tablet, Rfl: 0  ???  docusate sodium (COLACE) 100 MG capsule, Take 100 mg by mouth daily., Disp: , Rfl:   ???  dronabinoL (MARINOL) 2.5 MG capsule, Take 1 capsule (2.5 mg total) by mouth nightly as needed (sleep and/or nausea. Hold for excess sedation.)., Disp: 30 capsule, Rfl: 0  ???  famotidine (PEPCID) 20 MG tablet, Take 20 mg by mouth daily., Disp: , Rfl:   ???  fentaNYL (DURAGESIC) 50 mcg/hr patch, Place 1 patch on the skin every third day., Disp: 10 patch, Rfl: 0  ???  fluconazole (DIFLUCAN) 400 mg/200 mL IVPB, Infuse 200 mL (400 mg total) into a venous catheter daily for 8 days., Disp: 1600 mL, Rfl: 0  ???  furosemide (LASIX) 20 MG tablet, Take 1 tablet (20 mg total) by mouth daily as needed for swelling (weight gain greater than 2 lbs in 24 hrs or 5 lbs in 48 hrs)., Disp: 30 tablet, Rfl: 0  ???  hydrOXYzine (ATARAX) 25 MG tablet, Take 1 tablet (25 mg total) by mouth every six (6) hours as needed for anxiety., Disp: 30 tablet, Rfl: 0  ???  lidocaine 4 % patch, Place 1 patch on the skin daily. Apply to affected area for 12 hours only each day (then remove patch)., Disp: 30 patch, Rfl: 0  ???  metoclopramide (REGLAN) 10 MG tablet, Take 1 tablet (10 mg total) by mouth every eight (8) hours as needed (nausea/vomiting)., Disp: 90 tablet, Rfl:  0  ???  metroNIDAZOLE (FLAGYL) 500 mg/100 mL IVPB, Infuse 100 mL (500 mg total) into a venous catheter every eight (8) hours for 8 days., Disp: 2400 mL, Rfl: 0  ???  OLANZapine (ZYPREXA) 2.5 MG tablet, Take 1 tablet (2.5 mg total) by mouth nightly., Disp: 30 tablet, Rfl: 0  ???  omeprazole (PRILOSEC) 40 MG capsule, Take 40 mg by mouth daily., Disp: , Rfl:   ???  polyethylene glycol (GLYCOLAX) 17 gram/dose powder, Take 17 g by mouth daily., Disp: , Rfl:   ???  scopolamine (TRANSDERM-SCOP) 1 mg over 3 days, Place 1 patch on the skin every third day., Disp: , Rfl:   ???  senna (SENOKOT) 8.6 mg tablet, Take 2 tablets by mouth two (2) times a day as needed for constipation., Disp: 30 tablet, Rfl: 0  ???  simethicone (MYLICON) 80 MG chewable tablet, Chew 1 tablet (80 mg total) every six (6) hours as needed., Disp: 100 tablet, Rfl: 0    Allergies:  Carboplatin and Ketamine    Medical History:  No past medical history on file.    Surgical History:  Past Surgical History:   Procedure Laterality Date   ??? IR INSERT G-TUBE PERCUTANEOUS  09/06/2021    IR INSERT G-TUBE PERCUTANEOUS 09/06/2021 Gwenlyn Fudge, MD IMG VIR H&V [facility]         Objective:        There were no vitals taken for this visit.  There were no vitals taken for this visit.     Ideal body weight: 50.1 kg (110 lb 7.2 oz)  Adjusted ideal body weight: 59.9 kg (132 lb 0.9 oz)    Constitutional: Alert, interactive, comfortable  HEENT: No scleral icterus, moist mucous membranes, fair dentition  Pulm/Lungs: Unlabored respirations, symmetric chest wall movements, speaking in full sentences  Abd: ***  Skin: Warm and dry without obvious rashes on clothed exam  Neuro: Alert and interactive, no gross focal neurological deficits, moving all extremities  Line: ***    Pathology:  Diagnosis   Date Value Ref Range Status   09/24/2021   Final    Ascites fluid, paracentesis:  - Malignant cells present, consistent with metastatic carcinoma  - See note    This electronic signature is attestation that the pathologist personally reviewed the submitted material(s) and the final diagnosis reflects that evaluation.          Labs:  Notable for WBC 8.1, Plts 230, Cr 0.89  Lab Results   Component Value Date    WBC 8.1 10/02/2021    NEUTROABS 6.4 10/01/2021    LYMPHSABS 1.2 10/01/2021    EOSABS 0.1 10/01/2021    HGB 7.9 (L) 10/02/2021    HCT 23.1 (L) 10/02/2021    PLT 230 10/02/2021       Lab Results   Component Value Date    NA 145 10/02/2021    K 3.6 10/02/2021    CL 109 (H) 10/02/2021    CO2 28.0 10/02/2021    BUN 41 (H) 10/02/2021    CREATININE 0.89 (H) 10/02/2021    CALCIUM 8.5 (L) 10/02/2021    MG 1.6 10/02/2021    PHOS 4.0 10/02/2021       Lab Results   Component Value Date    ALKPHOS 49 10/02/2021    BILITOT <0.2 (L) 10/02/2021    BILIDIR 0.10 09/11/2021    PROT 5.0 (L) 10/02/2021    ALBUMIN 2.2 (L) 10/02/2021    ALT <7 (L) 10/02/2021  AST 15 10/02/2021       No results found for: ESR, CRP    Microbiology:    Lab Results   Component Value Date    ANACX 1+ Candida dubliniensis (A) 09/22/2021    ANACX 1+ Mixed Anaerobes Isolated (A) 09/22/2021    ANACX (A) 09/10/2021     4+ Mixed Gram Positive/Gram Negative Organisms Isolated    ANACX 3+ Citrobacter freundii (A) 09/10/2021    ANACX 2+ Pseudomonas aeruginosa (A) 09/10/2021    ANACX 4+ Streptococcus anginosus group (A) 09/10/2021       Serologies:  Lab Results   Component Value Date    Hep B S Ab Nonreactive 09/06/2021    Hepatitis C Ab Nonreactive 08/31/2021       Radiology:  Personally reviewed by me.    EXAM: CT ABDOMEN PELVIS W CONTRAST  DATE: 09/22/2021 4:51 PM  1. Persistent dilatation of small bowel loops with several transition points compatible with complex small bowel obstruction with likely closed loop component. There is slight interval increase in enhancement of the affected bowel mucosa. Recommend correlation with lactate and/or surgical consultation for ischemia.  2. Scattered foci of gas in the anterior peritoneum. These do not definitively technique with the enterocutaneous fistula and may represent focal walled off perforation.  3. New enhancement of peritoneal ascites concerning for peritonitis.  4. Decreased size of gas and fluid containing collection in the left anterior abdominal wall arising from the known enterocutaneous fistula status post drain placement.  ====================  ADDENDUM:   (09/22/2021 8:26 PM)  On review, the following additional findings were noted:  Limited exam in the absence of oral contrast.   Redemonstration of a complex enterocutaneous fistula with associated soft tissue stranding at the level of the anterior midline abdomen. There has been interval placement of a pigtail drainage catheter within the previously-described air and fluid collection along the left lower anterior abdominal wall, which is significantly decreased in size when compared to prior exam.  Numerous persistently dilated loops of small bowel are seen throughout the abdomen and pelvis, which demonstrate air-fluid levels and measure up to 3.7 cm in diameter. There is similar tethering of the small bowel along the anterior peritoneum with abrupt collapse at this level, thought likely to reflect a transition point (2:72). The distal small bowel appears mostly decompressed to the level of the cecum. These findings remain worrisome for small bowel obstruction.   Low attenuation abdominopelvic ascites (~18 HU) and diffuse peritoneal thickening/enhancement is similar to prior exam. While these findings, could potentially be seen in the setting of peritonitis, could also be seen with diffuse peritoneal disease spread and associated malignant ascites. Recommend clinical correlation.  Elsewhere throughout the abdomen multiple loops of small demonstrate short segments of wall thickening, hyperenhancement, and luminal narrowing (2:63; 2:94), which may reflect sites of serosal involvement and associated stricturing.     Coordination of Care:  I have communicated with the patient's primary care and specialty providers regarding this plan of care.         Althia Forts, MD  [facility] Infectious Diseases [facility]  Phone: [phone]   Fax: [phone]     Results communicated via Epic fax to patient's primary care provider Carver Fila, MD

## 2021-10-07 ENCOUNTER — Other Ambulatory Visit (HOSPITAL_COMMUNITY): Payer: Self-pay

## 2021-10-07 DIAGNOSIS — C569 Malignant neoplasm of unspecified ovary: Principal | ICD-10-CM

## 2021-10-07 LAB — REFERRAL LABORATORY TEST, OTHER

## 2021-10-07 NOTE — Unmapped (Signed)
Specialty Medication(s): cyclophophosphamide    Colleen Russo has been dis-enrolled from the [facility] specialty pharmacy services due to a change in therapy. The patient is now taking doxorubicin and is not filling at the [facility].    Additional information provided to the patient: no    Rollen Sox  [facility] Specialty Pharmacist

## 2021-10-08 ENCOUNTER — Telehealth
Admit: 2021-10-08 | Discharge: 2021-10-08 | Payer: PRIVATE HEALTH INSURANCE | Attending: Student in an Organized Health Care Education/Training Program | Primary: Student in an Organized Health Care Education/Training Program

## 2021-10-08 ENCOUNTER — Ambulatory Visit
Admit: 2021-10-08 | Discharge: 2021-10-08 | Payer: PRIVATE HEALTH INSURANCE | Attending: Student in an Organized Health Care Education/Training Program | Primary: Student in an Organized Health Care Education/Training Program

## 2021-10-08 ENCOUNTER — Other Ambulatory Visit: Payer: Self-pay | Admitting: Gynecologic Oncology

## 2021-10-08 ENCOUNTER — Encounter: Payer: Self-pay | Admitting: Oncology

## 2021-10-08 ENCOUNTER — Other Ambulatory Visit (HOSPITAL_COMMUNITY): Payer: Self-pay

## 2021-10-08 DIAGNOSIS — Z789 Other specified health status: Principal | ICD-10-CM

## 2021-10-08 DIAGNOSIS — K632 Fistula of intestine: Principal | ICD-10-CM

## 2021-10-08 DIAGNOSIS — Z792 Long term (current) use of antibiotics: Principal | ICD-10-CM

## 2021-10-08 DIAGNOSIS — E119 Type 2 diabetes mellitus without complications: Principal | ICD-10-CM

## 2021-10-08 DIAGNOSIS — L0291 Cutaneous abscess, unspecified: Principal | ICD-10-CM

## 2021-10-08 DIAGNOSIS — Z5111 Encounter for antineoplastic chemotherapy: Secondary | ICD-10-CM

## 2021-10-08 DIAGNOSIS — C563 Malignant neoplasm of bilateral ovaries: Secondary | ICD-10-CM

## 2021-10-08 MED ORDER — INSULIN LISPRO (U-100) 100 UNIT/ML SUBCUTANEOUS PEN
12 refills | 0 days | Status: CP
Start: 2021-10-08 — End: ?

## 2021-10-08 MED ORDER — INSULIN LISPRO (1 UNIT DIAL) 100 UNIT/ML (KWIKPEN)
PEN_INJECTOR | SUBCUTANEOUS | 12 refills | Status: AC
  Filled 2021-10-08: qty 12, 15d supply, fill #0

## 2021-10-09 ENCOUNTER — Ambulatory Visit (HOSPITAL_COMMUNITY)
Admission: RE | Admit: 2021-10-09 | Discharge: 2021-10-09 | Disposition: A | Discharge status: Home / Self Care | Payer: Medicaid Other | Source: Ambulatory Visit | Attending: Gynecologic Oncology | Admitting: Gynecologic Oncology

## 2021-10-09 ENCOUNTER — Other Ambulatory Visit: Payer: Self-pay

## 2021-10-09 ENCOUNTER — Telehealth: Payer: Self-pay | Admitting: Gynecologic Oncology

## 2021-10-09 ENCOUNTER — Other Ambulatory Visit (HOSPITAL_COMMUNITY): Payer: Self-pay

## 2021-10-09 DIAGNOSIS — L0291 Cutaneous abscess, unspecified: Principal | ICD-10-CM

## 2021-10-09 DIAGNOSIS — C563 Malignant neoplasm of bilateral ovaries: Secondary | ICD-10-CM | POA: Diagnosis not present

## 2021-10-09 DIAGNOSIS — Z5111 Encounter for antineoplastic chemotherapy: Secondary | ICD-10-CM | POA: Insufficient documentation

## 2021-10-09 DIAGNOSIS — Z0189 Encounter for other specified special examinations: Secondary | ICD-10-CM

## 2021-10-09 LAB — ECHOCARDIOGRAM COMPLETE: S' Lateral: 3.3 cm

## 2021-10-09 NOTE — Telephone Encounter (Signed)
Called Rob since I could not reach [place]. Confirmed that they are aware of ECHO at [place] at [facility] this am and getting ready to head that way. ? ?Jeral Pinch MD ?Gynecologic Oncology ? ?

## 2021-10-11 ENCOUNTER — Telehealth: Payer: Self-pay | Admitting: *Deleted

## 2021-10-11 NOTE — Telephone Encounter (Signed)
Per Dr Berline Lopes, fax patient's echo to [facility]  ?

## 2021-10-14 ENCOUNTER — Ambulatory Visit: Admit: 2021-10-14 | Discharge: 2021-10-15 | Payer: PRIVATE HEALTH INSURANCE

## 2021-10-14 ENCOUNTER — Ambulatory Visit
Admit: 2021-10-14 | Discharge: 2021-10-15 | Payer: PRIVATE HEALTH INSURANCE | Attending: Student in an Organized Health Care Education/Training Program | Primary: Student in an Organized Health Care Education/Training Program

## 2021-10-14 DIAGNOSIS — G893 Neoplasm related pain (acute) (chronic): Principal | ICD-10-CM

## 2021-10-14 DIAGNOSIS — K56609 Unspecified intestinal obstruction, unspecified as to partial versus complete obstruction: Principal | ICD-10-CM

## 2021-10-14 DIAGNOSIS — C569 Malignant neoplasm of unspecified ovary: Principal | ICD-10-CM

## 2021-10-14 MED ORDER — ESCITALOPRAM 10 MG TABLET
ORAL_TABLET | Freq: Every day | ORAL | 3 refills | 60.00000 days | Status: CP
Start: 2021-10-14 — End: 2021-11-13

## 2021-10-14 MED ORDER — ONDANSETRON HCL 8 MG TABLET
ORAL_TABLET | Freq: Three times a day (TID) | ORAL | 2 refills | 10.00000 days | Status: CP | PRN
Start: 2021-10-14 — End: ?

## 2021-10-14 MED ORDER — PROCHLORPERAZINE MALEATE 10 MG TABLET
ORAL_TABLET | Freq: Four times a day (QID) | ORAL | 2 refills | 8.00000 days | Status: CP | PRN
Start: 2021-10-14 — End: ?
  Filled 2021-12-16: qty 30, 8d supply, fill #0

## 2021-10-21 ENCOUNTER — Other Ambulatory Visit (HOSPITAL_COMMUNITY): Payer: Medicaid Other

## 2021-10-21 MED ORDER — FENTANYL 50 MCG/HR TRANSDERMAL PATCH
MEDICATED_PATCH | TRANSDERMAL | 0 refills | 30.00000 days | Status: CP
Start: 2021-10-21 — End: ?

## 2021-10-22 ENCOUNTER — Telehealth: Admit: 2021-10-22 | Discharge: 2021-10-23 | Payer: PRIVATE HEALTH INSURANCE

## 2021-10-28 MED ORDER — NALOXONE 4 MG/ACTUATION NASAL SPRAY
0 refills | 0 days | Status: CP
Start: 2021-10-28 — End: ?

## 2021-10-31 ENCOUNTER — Institutional Professional Consult (permissible substitution)
Admit: 2021-10-31 | Discharge: 2021-11-01 | Payer: PRIVATE HEALTH INSURANCE | Attending: Diagnostic Radiology | Primary: Diagnostic Radiology

## 2021-10-31 DIAGNOSIS — L0291 Cutaneous abscess, unspecified: Principal | ICD-10-CM

## 2021-10-31 DIAGNOSIS — K56609 Unspecified intestinal obstruction, unspecified as to partial versus complete obstruction: Principal | ICD-10-CM

## 2021-11-01 ENCOUNTER — Ambulatory Visit: Admit: 2021-11-01 | Discharge: 2021-11-02 | Payer: PRIVATE HEALTH INSURANCE

## 2021-11-01 DIAGNOSIS — L0291 Cutaneous abscess, unspecified: Principal | ICD-10-CM

## 2021-11-06 MED ORDER — FENTANYL 50 MCG/HR TRANSDERMAL PATCH
MEDICATED_PATCH | TRANSDERMAL | 0 refills | 30 days
Start: 2021-11-06 — End: ?

## 2021-11-10 ENCOUNTER — Ambulatory Visit: Admit: 2021-11-10 | Discharge: 2021-11-16 | Payer: PRIVATE HEALTH INSURANCE

## 2021-11-10 ENCOUNTER — Encounter: Admit: 2021-11-10 | Discharge: 2021-11-16 | Payer: PRIVATE HEALTH INSURANCE

## 2021-11-10 ENCOUNTER — Ambulatory Visit
Admit: 2021-11-10 | Discharge: 2021-11-16 | Disposition: A | Discharge status: Home Health | Payer: PRIVATE HEALTH INSURANCE

## 2021-11-10 ENCOUNTER — Encounter
Admit: 2021-11-10 | Discharge: 2021-11-16 | Disposition: A | Discharge status: Home Health | Payer: PRIVATE HEALTH INSURANCE

## 2021-11-12 MED ORDER — ENOXAPARIN 40 MG/0.4 ML SUBCUTANEOUS SYRINGE
Freq: Every day | SUBCUTANEOUS | 0 refills | 28 days
Start: 2021-11-12 — End: 2021-11-12

## 2021-11-13 ENCOUNTER — Encounter: Payer: Self-pay | Admitting: Oncology

## 2021-11-13 NOTE — Progress Notes (Signed)
Requested folate receptor alpha testing on accession [id number] with [facility] Pathology via email. ?

## 2021-11-14 DIAGNOSIS — C569 Malignant neoplasm of unspecified ovary: Principal | ICD-10-CM

## 2021-11-14 DIAGNOSIS — K56609 Unspecified intestinal obstruction, unspecified as to partial versus complete obstruction: Principal | ICD-10-CM

## 2021-11-14 DIAGNOSIS — G893 Neoplasm related pain (acute) (chronic): Principal | ICD-10-CM

## 2021-11-15 DIAGNOSIS — I2699 Other pulmonary embolism without acute cor pulmonale: Principal | ICD-10-CM

## 2021-11-15 MED ORDER — ENOXAPARIN 60 MG/0.6 ML SUBCUTANEOUS SYRINGE
Freq: Two times a day (BID) | SUBCUTANEOUS | 0 refills | 30 days
Start: 2021-11-15 — End: 2021-11-15

## 2021-11-16 MED ORDER — POLYETHYLENE GLYCOL 3350 17 GRAM/DOSE ORAL POWDER
Freq: Every day | ORAL | 0 refills | 30 days | Status: CP | PRN
Start: 2021-11-16 — End: 2021-12-16
  Filled 2021-11-16: qty 510, 30d supply, fill #0

## 2021-11-16 MED ORDER — VANCOMYCIN 2.5 MG/ML (2 ML) LOCK IN NS
0 refills | 18 days | Status: CP
Start: 2021-11-16 — End: 2021-11-15

## 2021-11-16 MED ORDER — ENOXAPARIN 80 MG/0.8 ML SUBCUTANEOUS SYRINGE
Freq: Two times a day (BID) | SUBCUTANEOUS | 0 refills | 48 days | Status: CP
Start: 2021-11-16 — End: 2021-12-28
  Filled 2021-11-16: qty 54.4, 34d supply, fill #0

## 2021-11-16 MED ORDER — ESCITALOPRAM 5 MG TABLET
ORAL_TABLET | Freq: Every day | ORAL | 0 refills | 30 days | Status: CP
Start: 2021-11-16 — End: 2021-12-16
  Filled 2021-11-16: qty 30, 30d supply, fill #0

## 2021-11-17 MED ORDER — FENTANYL 75 MCG/HR TRANSDERMAL PATCH
MEDICATED_PATCH | TRANSDERMAL | 0 refills | 30 days | Status: CP
Start: 2021-11-17 — End: ?
  Filled 2021-11-16: qty 10, 30d supply, fill #0
  Filled 2022-01-06: qty 2, 6d supply, fill #0

## 2021-11-19 ENCOUNTER — Telehealth
Admit: 2021-11-19 | Discharge: 2021-11-19 | Payer: PRIVATE HEALTH INSURANCE | Attending: Student in an Organized Health Care Education/Training Program | Primary: Student in an Organized Health Care Education/Training Program

## 2021-11-19 DIAGNOSIS — Z789 Other specified health status: Principal | ICD-10-CM

## 2021-11-19 DIAGNOSIS — E119 Type 2 diabetes mellitus without complications: Principal | ICD-10-CM

## 2021-11-20 ENCOUNTER — Encounter (HOSPITAL_COMMUNITY): Payer: Self-pay | Admitting: Gynecologic Oncology

## 2021-11-20 MED ORDER — FENTANYL 50 MCG/HR TRANSDERMAL PATCH
MEDICATED_PATCH | TRANSDERMAL | 0 refills | 30 days
Start: 2021-11-20 — End: ?

## 2021-11-21 ENCOUNTER — Ambulatory Visit: Admit: 2021-11-21 | Payer: PRIVATE HEALTH INSURANCE

## 2021-11-25 ENCOUNTER — Ambulatory Visit: Admit: 2021-11-25 | Discharge: 2021-11-25 | Payer: PRIVATE HEALTH INSURANCE

## 2021-11-25 ENCOUNTER — Ambulatory Visit
Admit: 2021-11-25 | Discharge: 2021-12-02 | Disposition: A | Discharge status: Home Health | Payer: PRIVATE HEALTH INSURANCE | Source: Ambulatory Visit

## 2021-11-25 ENCOUNTER — Ambulatory Visit
Admit: 2021-11-25 | Discharge: 2021-12-02 | Disposition: A | Discharge status: Home / Self Care | Payer: PRIVATE HEALTH INSURANCE | Source: Ambulatory Visit

## 2021-11-25 ENCOUNTER — Ambulatory Visit: Admit: 2021-11-25 | Payer: PRIVATE HEALTH INSURANCE

## 2021-11-25 ENCOUNTER — Ambulatory Visit: Admit: 2021-11-25 | Discharge: 2021-12-02 | Payer: PRIVATE HEALTH INSURANCE

## 2021-11-25 DIAGNOSIS — C762 Malignant neoplasm of abdomen: Principal | ICD-10-CM

## 2021-11-29 MED ORDER — OLANZAPINE 2.5 MG TABLET
ORAL_TABLET | Freq: Every evening | ORAL | 0 refills | 30 days
Start: 2021-11-29 — End: 2021-12-29

## 2021-11-29 MED ORDER — PROCHLORPERAZINE MALEATE 10 MG TABLET
ORAL_TABLET | Freq: Three times a day (TID) | ORAL | 3 refills | 10 days | PRN
Start: 2021-11-29 — End: 2022-01-08

## 2021-11-30 MED ORDER — MORPHINE CONCENTRATE 20 MG/ML ORAL SOLUTION WRAPPER
ORAL | 3 refills | 30 days | PRN
Start: 2021-11-30 — End: 2021-12-30

## 2021-12-01 MED ORDER — FENTANYL 75 MCG/HR TRANSDERMAL PATCH
MEDICATED_PATCH | TRANSDERMAL | 3 refills | 30 days
Start: 2021-12-01 — End: ?

## 2021-12-02 ENCOUNTER — Other Ambulatory Visit (HOSPITAL_COMMUNITY): Payer: Self-pay

## 2021-12-02 MED ORDER — PROCHLORPERAZINE MALEATE 10 MG TABLET
ORAL_TABLET | Freq: Three times a day (TID) | ORAL | 3 refills | 10 days | Status: CN | PRN
Start: 2021-12-02 — End: 2022-01-11

## 2021-12-02 MED ORDER — MAGIC MOUTHWASH ORAL SUSPENSION
Freq: Four times a day (QID) | ORAL | 1 refills | 30 days | Status: CP
Start: 2021-12-02 — End: 2022-01-31

## 2021-12-02 MED ORDER — SUCRALFATE 100 MG/ML ORAL SUSPENSION
Freq: Four times a day (QID) | ORAL | 0 refills | 11 days | Status: CP
Start: 2021-12-02 — End: 2022-01-01
  Filled 2021-12-02: qty 420, 11d supply, fill #0

## 2021-12-02 MED ORDER — LIDOCAINE HCL 2 % MUCOSAL SOLUTION
OROMUCOSAL | 3 refills | 14.00000 days | Status: CP | PRN
Start: 2021-12-02 — End: 2022-01-27

## 2021-12-02 MED ORDER — DIPHENHYD 25 MG-LIDO 200 MG-MAG,AL 400 MG-SIMETH 40 MG/30 ML MOUTHWASH
Freq: Four times a day (QID) | OROMUCOSAL | 0 refills | 12 days | Status: CP | PRN
Start: 2021-12-02 — End: 2021-12-02

## 2021-12-02 MED ORDER — MAGIC MOUTHWASH WITH LIDOCAINE ORAL SUSPENSION
Freq: Four times a day (QID) | ORAL | 0 refills | 30 days | Status: CP
Start: 2021-12-02 — End: 2022-01-01

## 2021-12-02 MED ORDER — MORPHINE 10 MG/5 ML ORAL SOLUTION
ORAL | 0 refills | 25 days | Status: CP | PRN
Start: 2021-12-02 — End: 2022-01-01
  Filled 2021-12-02: qty 500, 13d supply, fill #0

## 2021-12-02 MED ORDER — OLANZAPINE 2.5 MG TABLET
ORAL_TABLET | Freq: Every evening | ORAL | 0 refills | 30 days | Status: CN
Start: 2021-12-02 — End: 2022-01-01

## 2021-12-02 MED ORDER — PANTOPRAZOLE 2MG/ML SUS
Freq: Every day | ORAL | 0 refills | 30 days | Status: CP
Start: 2021-12-02 — End: 2022-01-01
  Filled 2021-12-02: qty 120, 12d supply, fill #0

## 2021-12-02 MED ORDER — FENTANYL 75 MCG/HR TRANSDERMAL PATCH
MEDICATED_PATCH | TRANSDERMAL | 3 refills | 30 days | Status: CN
Start: 2021-12-02 — End: ?

## 2021-12-02 MED ORDER — FAT EMULSION-SOYBEAN OIL-MCT-OLIVE OIL-FISH OIL 20 % INTRAVENOUS
Freq: Every day | INTRAVENOUS | 0 refills | 30.00000 days | Status: CP
Start: 2021-12-02 — End: 2022-01-01

## 2021-12-02 MED ORDER — SMOFLIPID 20 % IV EMUL
INTRAVENOUS | 0 refills | Status: AC
Start: 2021-12-02 — End: ?

## 2021-12-04 ENCOUNTER — Telehealth: Payer: Self-pay | Admitting: Oncology

## 2021-12-04 DIAGNOSIS — C569 Malignant neoplasm of unspecified ovary: Principal | ICD-10-CM

## 2021-12-04 DIAGNOSIS — Z5111 Encounter for antineoplastic chemotherapy: Principal | ICD-10-CM

## 2021-12-04 NOTE — Telephone Encounter (Signed)
Attempted to call Sarah Newman regarding appointment on 12/09/21 with Dr. Berline Lopes.  Voice mail is full.  Also sent her an email to contact the office. ?

## 2021-12-06 ENCOUNTER — Encounter: Payer: Self-pay | Admitting: Oncology

## 2021-12-06 NOTE — Telephone Encounter (Signed)
Attempted to call Sarah Newman and voice mail is full.  Also tried to call her husband and his voice mail is full.  Sent her a MyChart message requesting a return call to the office. ?

## 2021-12-09 ENCOUNTER — Ambulatory Visit: Admit: 2021-12-09 | Discharge: 2021-12-10 | Payer: PRIVATE HEALTH INSURANCE

## 2021-12-09 ENCOUNTER — Ambulatory Visit
Admit: 2021-12-09 | Discharge: 2021-12-10 | Payer: PRIVATE HEALTH INSURANCE | Attending: Student in an Organized Health Care Education/Training Program | Primary: Student in an Organized Health Care Education/Training Program

## 2021-12-09 ENCOUNTER — Inpatient Hospital Stay: Payer: Medicaid Other | Admitting: Gynecologic Oncology

## 2021-12-09 DIAGNOSIS — C562 Malignant neoplasm of left ovary: Secondary | ICD-10-CM

## 2021-12-11 DIAGNOSIS — T402X1A Poisoning by other opioids, accidental (unintentional), initial encounter: Principal | ICD-10-CM

## 2021-12-11 MED ORDER — NALOXONE 4 MG/ACTUATION NASAL SPRAY
0 refills | 0 days | Status: CP
Start: 2021-12-11 — End: ?
  Filled 2021-12-16: qty 2, 1d supply, fill #0

## 2021-12-17 ENCOUNTER — Telehealth
Admit: 2021-12-17 | Discharge: 2021-12-18 | Payer: PRIVATE HEALTH INSURANCE | Attending: Student in an Organized Health Care Education/Training Program | Primary: Student in an Organized Health Care Education/Training Program

## 2021-12-17 DIAGNOSIS — E119 Type 2 diabetes mellitus without complications: Principal | ICD-10-CM

## 2021-12-17 DIAGNOSIS — Z789 Other specified health status: Principal | ICD-10-CM

## 2021-12-18 MED FILL — WATER FOR IRRIGATION, STERILE SOLUTION, PANTOPRAZOLE 40 MG TABLET,DELAYED RELEASE, METHYLCELLULOSE 4000CPS (BULK) 30 % POWDER, SODIUM BICARBONATE (BULK) POWDER: ORAL | 12 days supply | Qty: 120 | Fill #0

## 2021-12-21 ENCOUNTER — Encounter: Admit: 2021-12-21 | Discharge: 2022-01-06 | Disposition: A | Discharge status: Hospice | Payer: PRIVATE HEALTH INSURANCE

## 2021-12-21 ENCOUNTER — Ambulatory Visit: Admit: 2021-12-21 | Payer: PRIVATE HEALTH INSURANCE

## 2021-12-21 ENCOUNTER — Encounter: Admit: 2021-12-21 | Payer: PRIVATE HEALTH INSURANCE

## 2021-12-21 ENCOUNTER — Ambulatory Visit: Admit: 2021-12-21 | Discharge: 2022-01-06 | Disposition: A | Discharge status: Hospice | Payer: PRIVATE HEALTH INSURANCE

## 2022-01-06 MED ORDER — OLANZAPINE 5 MG DISINTEGRATING TABLET
ORAL_TABLET | Freq: Every evening | ORAL | 0 refills | 30 days | Status: CP
Start: 2022-01-06 — End: 2022-02-05
  Filled 2022-01-06: qty 3, 3d supply, fill #0

## 2022-01-06 MED ORDER — LORAZEPAM 1 MG TABLET
ORAL_TABLET | ORAL | 0 refills | 3 days | Status: CP | PRN
Start: 2022-01-06 — End: 2022-01-11
  Filled 2022-01-06: qty 15, 3d supply, fill #0

## 2022-01-06 MED ORDER — AMOXICILLIN 875 MG-POTASSIUM CLAVULANATE 125 MG TABLET
ORAL_TABLET | Freq: Two times a day (BID) | ORAL | 0 refills | 5 days | Status: CP
Start: 2022-01-06 — End: 2022-01-11
  Filled 2022-01-06: qty 10, 5d supply, fill #0

## 2022-01-06 MED ORDER — MORPHINE 10 MG/5 ML ORAL SOLUTION
ORAL | 0 refills | 5 days | Status: CP | PRN
Start: 2022-01-06 — End: 2022-01-11
  Filled 2022-01-06: qty 200, 5d supply, fill #0

## 2022-01-06 MED ORDER — LINEZOLID 600 MG TABLET
ORAL_TABLET | Freq: Two times a day (BID) | ORAL | 0 refills | 5 days | Status: CP
Start: 2022-01-06 — End: 2022-01-11
  Filled 2022-01-06: qty 10, 5d supply, fill #0

## 2022-01-06 MED ORDER — FLUCONAZOLE 200 MG TABLET
ORAL_TABLET | Freq: Every day | ORAL | 0 refills | 5 days | Status: CP
Start: 2022-01-06 — End: 2022-01-11
  Filled 2022-01-06: qty 10, 5d supply, fill #0

## 2022-01-09 MED ORDER — FENTANYL 75 MCG/HR TRANSDERMAL PATCH
MEDICATED_PATCH | TRANSDERMAL | 0 refills | 6 days | Status: CP
Start: 2022-01-09 — End: 2022-01-15

## 2022-02-01 DEATH — deceased

## 2022-08-05 IMAGING — CT CT ABD-PELV W/ CM
2 of 5 series · 17 of 46 positions shown, 19 images · IV contrast (Omnipaque)
Comparison: 04/05/2020

CLINICAL DATA: Follow-up ovarian carcinoma. Previous surgery and
chemotherapy.

EXAM:
CT ABDOMEN AND PELVIS WITH CONTRAST
TECHNIQUE: Multidetector CT imaging of the abdomen and pelvis was performed
using the standard protocol following bolus administration of
intravenous contrast.
CONTRAST:  100mL OMNIPAQUE IOHEXOL 300 MG/ML  SOLN

[Series 2: axial st · axial · 0.98mm/px · z∈[-614,-189]mm · 14 of 97 slices shown, 16 images]
[im 6/97  soft-tissue]
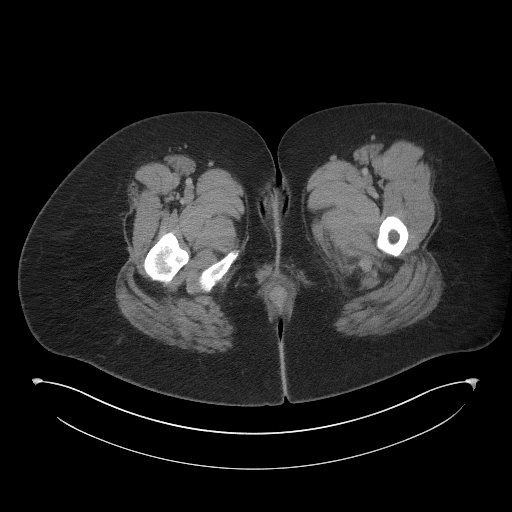
[im 6/97  bone]
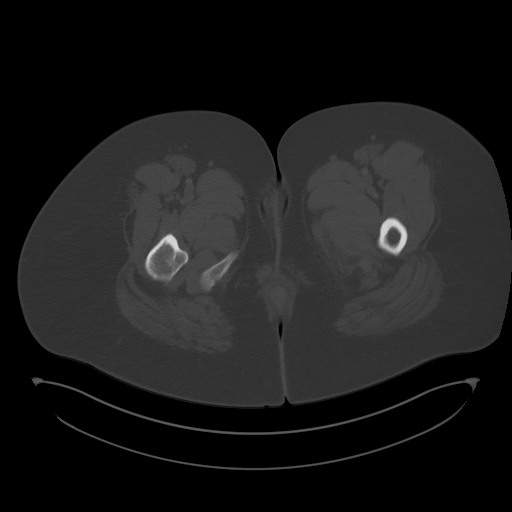
[im 11/97  soft-tissue]
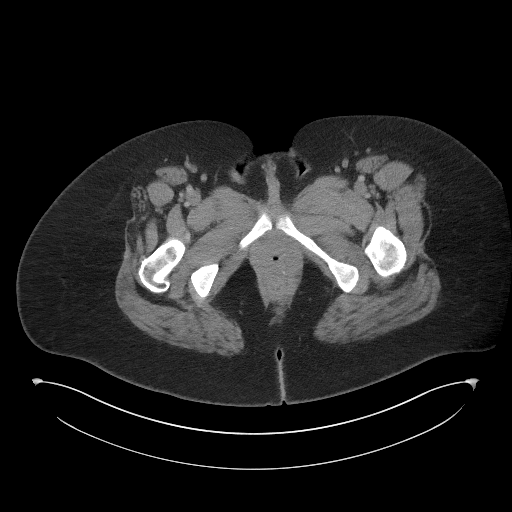
[im 21/97  soft-tissue]
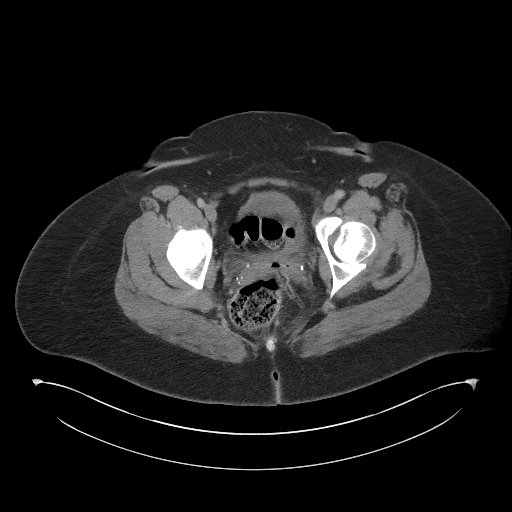
[im 26/97  soft-tissue]
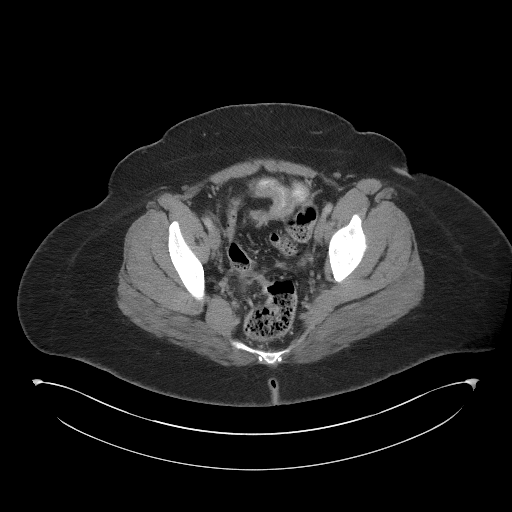
[im 31/97  soft-tissue]
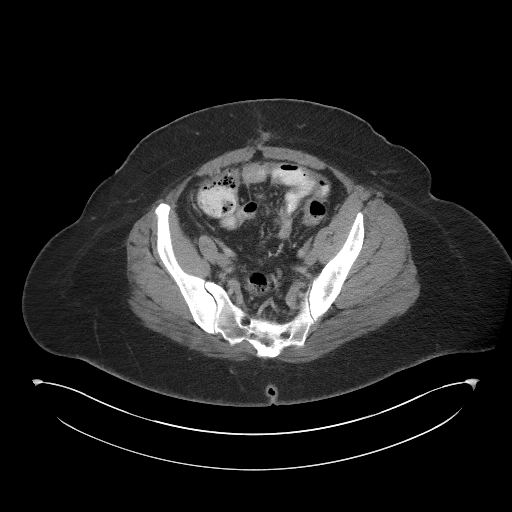
[im 41/97  soft-tissue]
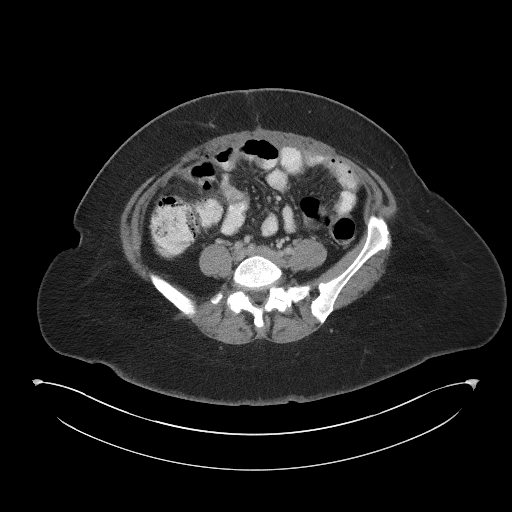
[im 46/97  soft-tissue]
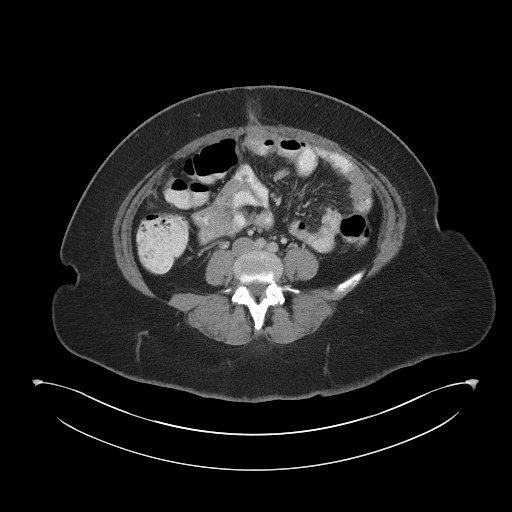
[im 51/97  soft-tissue]
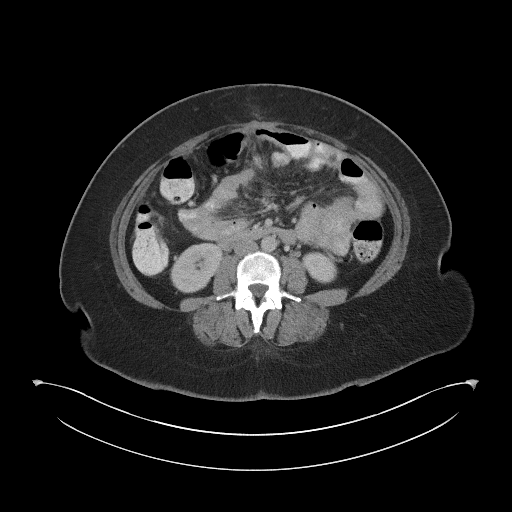
[im 56/97  soft-tissue]
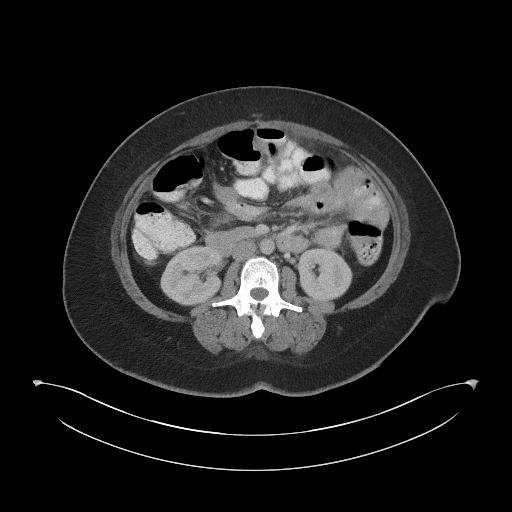
[im 56/97  bone]
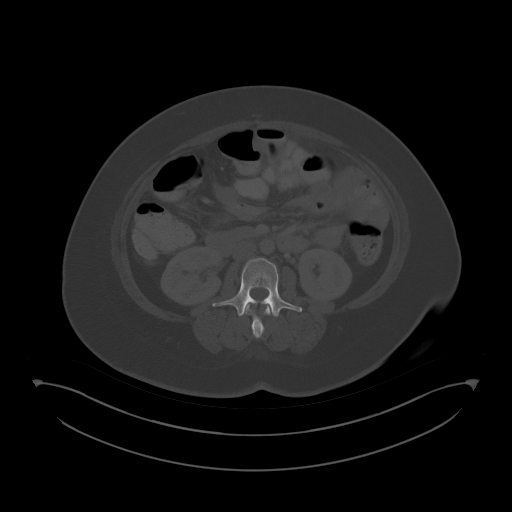
[im 66/97  soft-tissue]
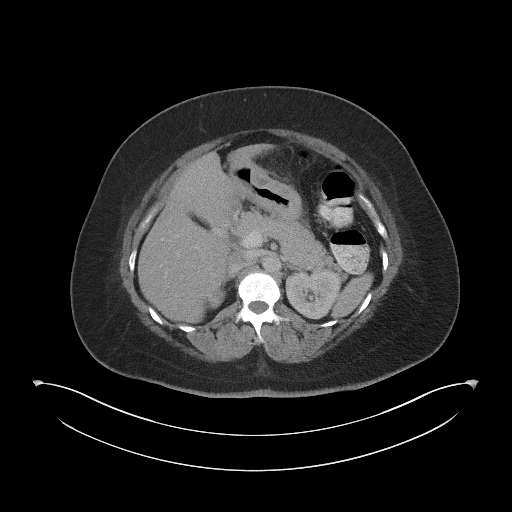
[im 71/97  soft-tissue]
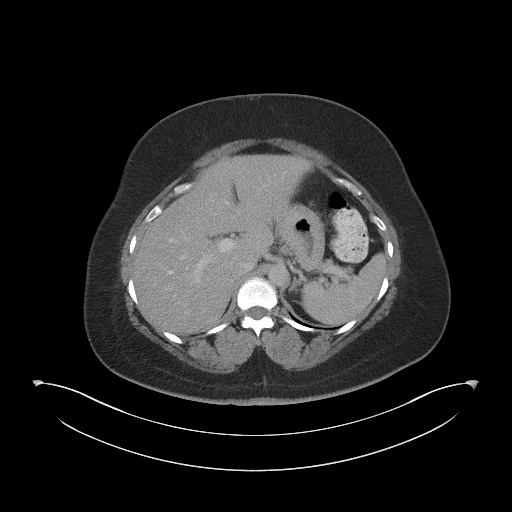
[im 76/97  soft-tissue]
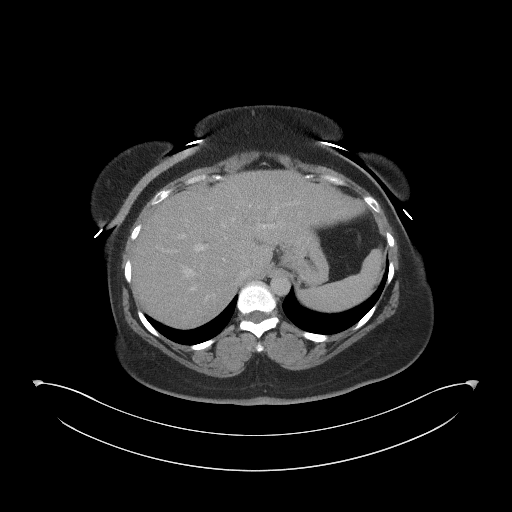
[im 86/97  soft-tissue]
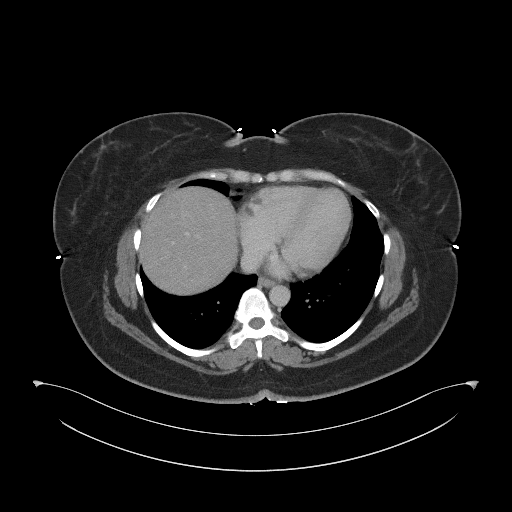
[im 91/97  soft-tissue]
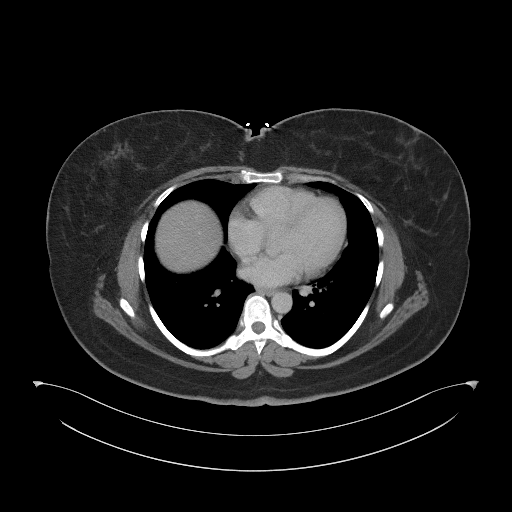

[Series 4: coronal st · coronal · 0.88mm/px · 3 of 109 slices shown]
[im 37/109  soft-tissue]
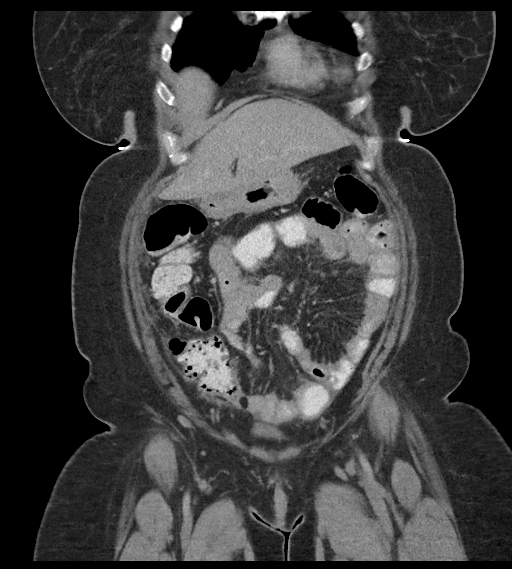
[im 49/109  soft-tissue]
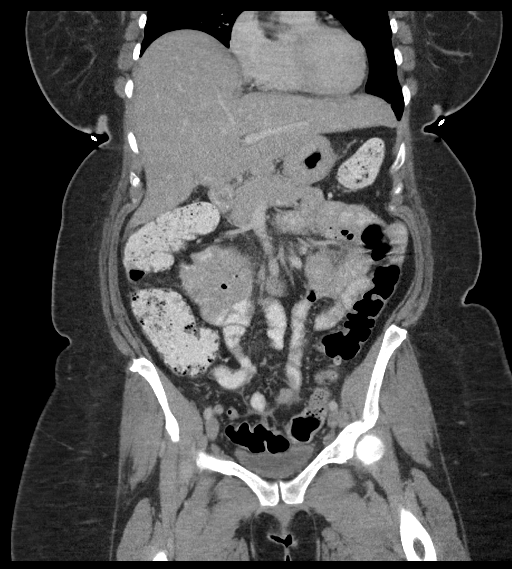
[im 61/109  soft-tissue]
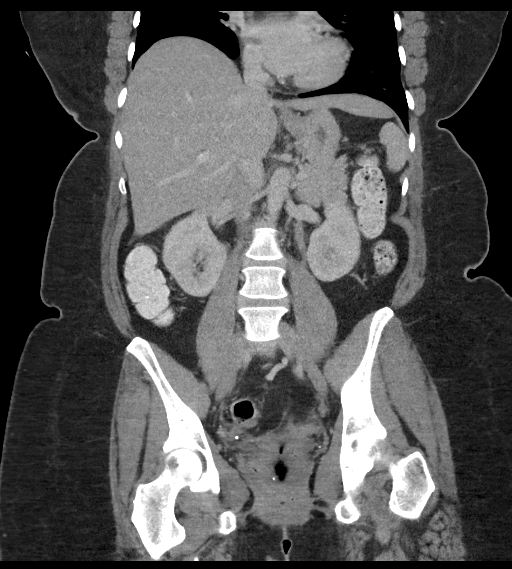

[17 of 46 positions shown; findings below may reference images not displayed]

FINDINGS: Lower Chest: No acute findings.

Hepatobiliary: No hepatic masses identified. Prior cholecystectomy.
No evidence of biliary obstruction.

Pancreas:  No mass or inflammatory changes.

Spleen: Within normal limits in size and appearance.

Adrenals/Urinary Tract: No masses identified. No evidence of
ureteral calculi or hydronephrosis.

Stomach/Bowel: No evidence of obstruction, inflammatory process or
abnormal fluid collections. Normal appendix visualized.

Vascular/Lymphatic: No pathologically enlarged lymph nodes. No
abdominal aortic aneurysm.

Reproductive: Prior hysterectomy again noted. Mild soft tissue
stranding is again seen throughout the mesenteric and omental fat,
however no discrete masses or ascites visualized.

Other:  None.

Musculoskeletal:  No suspicious bone lesions identified.
IMPRESSION: Stable mild stranding throughout mesenteric and omental fat, without
evidence of discrete masses or ascites. No evidence of new or
progressive disease.

## 2023-05-19 NOTE — Telephone Encounter (Signed)
TC
